# Patient Record
Sex: Female | Born: 1970 | Race: White | Hispanic: No | Marital: Single | Smoking: Former smoker
Health system: Southern US, Community
[De-identification: ages and names within clinical notes are randomized; demographics above are authoritative.]

## PROBLEM LIST (undated history)

## (undated) DIAGNOSIS — A64 Unspecified sexually transmitted disease: Secondary | ICD-10-CM

## (undated) DIAGNOSIS — Z8709 Personal history of other diseases of the respiratory system: Secondary | ICD-10-CM

## (undated) DIAGNOSIS — I4891 Unspecified atrial fibrillation: Secondary | ICD-10-CM

## (undated) DIAGNOSIS — I499 Cardiac arrhythmia, unspecified: Secondary | ICD-10-CM

## (undated) DIAGNOSIS — I10 Essential (primary) hypertension: Secondary | ICD-10-CM

## (undated) DIAGNOSIS — F329 Major depressive disorder, single episode, unspecified: Secondary | ICD-10-CM

## (undated) DIAGNOSIS — R32 Unspecified urinary incontinence: Secondary | ICD-10-CM

## (undated) DIAGNOSIS — M722 Plantar fascial fibromatosis: Secondary | ICD-10-CM

## (undated) DIAGNOSIS — F419 Anxiety disorder, unspecified: Secondary | ICD-10-CM

## (undated) DIAGNOSIS — F32A Depression, unspecified: Secondary | ICD-10-CM

## (undated) DIAGNOSIS — J45909 Unspecified asthma, uncomplicated: Secondary | ICD-10-CM

## (undated) DIAGNOSIS — Z87440 Personal history of urinary (tract) infections: Secondary | ICD-10-CM

## (undated) DIAGNOSIS — G43909 Migraine, unspecified, not intractable, without status migrainosus: Secondary | ICD-10-CM

## (undated) DIAGNOSIS — Z8619 Personal history of other infectious and parasitic diseases: Secondary | ICD-10-CM

## (undated) DIAGNOSIS — M7730 Calcaneal spur, unspecified foot: Secondary | ICD-10-CM

## (undated) DIAGNOSIS — I509 Heart failure, unspecified: Secondary | ICD-10-CM

## (undated) DIAGNOSIS — K219 Gastro-esophageal reflux disease without esophagitis: Secondary | ICD-10-CM

## (undated) DIAGNOSIS — R251 Tremor, unspecified: Secondary | ICD-10-CM

## (undated) HISTORY — DX: Gastro-esophageal reflux disease without esophagitis: K21.9

## (undated) HISTORY — DX: Anxiety disorder, unspecified: F41.9

## (undated) HISTORY — DX: Migraine, unspecified, not intractable, without status migrainosus: G43.909

## (undated) HISTORY — DX: Depression, unspecified: F32.A

## (undated) HISTORY — DX: Essential (primary) hypertension: I10

## (undated) HISTORY — DX: Unspecified atrial fibrillation: I48.91

## (undated) HISTORY — DX: Major depressive disorder, single episode, unspecified: F32.9

## (undated) HISTORY — DX: Personal history of other infectious and parasitic diseases: Z86.19

## (undated) HISTORY — DX: Heart failure, unspecified: I50.9

## (undated) HISTORY — DX: Unspecified asthma, uncomplicated: J45.909

## (undated) HISTORY — DX: Unspecified sexually transmitted disease: A64

---
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
## 1997-09-28 ENCOUNTER — Encounter (HOSPITAL_COMMUNITY): Admission: RE | Admit: 1997-09-28 | Discharge: 1997-12-27 | Payer: Self-pay | Admitting: Psychiatry

## 1997-10-12 ENCOUNTER — Emergency Department (HOSPITAL_COMMUNITY): Admission: EM | Admit: 1997-10-12 | Discharge: 1997-10-12 | Payer: Self-pay | Admitting: Emergency Medicine

## 1998-06-15 ENCOUNTER — Ambulatory Visit (HOSPITAL_COMMUNITY): Admission: RE | Admit: 1998-06-15 | Discharge: 1998-06-15 | Payer: Self-pay | Admitting: Gastroenterology

## 1998-06-15 ENCOUNTER — Encounter: Payer: Self-pay | Admitting: Gastroenterology

## 2000-04-08 ENCOUNTER — Other Ambulatory Visit: Admission: RE | Admit: 2000-04-08 | Discharge: 2000-04-08 | Payer: Self-pay | Admitting: *Deleted

## 2000-10-27 ENCOUNTER — Encounter: Admission: RE | Admit: 2000-10-27 | Discharge: 2000-12-29 | Payer: Self-pay

## 2001-03-22 ENCOUNTER — Other Ambulatory Visit: Admission: RE | Admit: 2001-03-22 | Discharge: 2001-03-22 | Payer: Self-pay | Admitting: Obstetrics and Gynecology

## 2002-03-23 ENCOUNTER — Encounter: Admission: RE | Admit: 2002-03-23 | Discharge: 2002-06-21 | Payer: Self-pay | Admitting: Internal Medicine

## 2003-09-04 ENCOUNTER — Inpatient Hospital Stay (HOSPITAL_COMMUNITY): Admission: EM | Admit: 2003-09-04 | Discharge: 2003-09-05 | Payer: Self-pay | Admitting: Emergency Medicine

## 2003-11-10 ENCOUNTER — Emergency Department (HOSPITAL_COMMUNITY): Admission: EM | Admit: 2003-11-10 | Discharge: 2003-11-11 | Payer: Self-pay | Admitting: Emergency Medicine

## 2003-11-11 ENCOUNTER — Inpatient Hospital Stay (HOSPITAL_COMMUNITY): Admission: EM | Admit: 2003-11-11 | Discharge: 2003-11-17 | Payer: Self-pay | Admitting: Psychiatry

## 2003-11-11 ENCOUNTER — Ambulatory Visit: Payer: Self-pay | Admitting: Psychiatry

## 2003-11-20 ENCOUNTER — Ambulatory Visit: Payer: Self-pay | Admitting: Psychiatry

## 2003-11-20 ENCOUNTER — Other Ambulatory Visit (HOSPITAL_COMMUNITY): Admission: RE | Admit: 2003-11-20 | Discharge: 2004-02-18 | Payer: Self-pay | Admitting: Psychiatry

## 2003-12-20 ENCOUNTER — Ambulatory Visit: Payer: Self-pay | Admitting: Psychiatry

## 2003-12-20 ENCOUNTER — Inpatient Hospital Stay (HOSPITAL_COMMUNITY): Admission: RE | Admit: 2003-12-20 | Discharge: 2003-12-23 | Payer: Self-pay | Admitting: Psychiatry

## 2003-12-20 ENCOUNTER — Emergency Department (HOSPITAL_COMMUNITY): Admission: EM | Admit: 2003-12-20 | Discharge: 2003-12-20 | Payer: Self-pay | Admitting: Emergency Medicine

## 2004-06-13 ENCOUNTER — Ambulatory Visit: Payer: Self-pay | Admitting: Psychiatry

## 2004-06-13 ENCOUNTER — Other Ambulatory Visit (HOSPITAL_COMMUNITY): Admission: RE | Admit: 2004-06-13 | Discharge: 2004-06-27 | Payer: Self-pay | Admitting: Psychiatry

## 2008-03-10 LAB — HM PAP SMEAR

## 2011-02-12 ENCOUNTER — Ambulatory Visit (INDEPENDENT_AMBULATORY_CARE_PROVIDER_SITE_OTHER): Payer: 59

## 2011-02-12 DIAGNOSIS — Z833 Family history of diabetes mellitus: Secondary | ICD-10-CM

## 2011-02-12 DIAGNOSIS — R059 Cough, unspecified: Secondary | ICD-10-CM

## 2011-02-12 DIAGNOSIS — J45909 Unspecified asthma, uncomplicated: Secondary | ICD-10-CM

## 2011-02-12 DIAGNOSIS — R05 Cough: Secondary | ICD-10-CM

## 2011-02-17 ENCOUNTER — Ambulatory Visit: Payer: Self-pay

## 2011-02-17 DIAGNOSIS — R059 Cough, unspecified: Secondary | ICD-10-CM

## 2011-02-17 DIAGNOSIS — R05 Cough: Secondary | ICD-10-CM

## 2011-02-17 DIAGNOSIS — H9209 Otalgia, unspecified ear: Secondary | ICD-10-CM

## 2011-11-08 ENCOUNTER — Ambulatory Visit: Payer: Self-pay | Admitting: Physician Assistant

## 2011-11-08 VITALS — BP 131/82 | HR 103 | Temp 99.8°F | Resp 18 | Ht 66.0 in | Wt 332.0 lb

## 2011-11-08 DIAGNOSIS — N898 Other specified noninflammatory disorders of vagina: Secondary | ICD-10-CM

## 2011-11-08 DIAGNOSIS — Z202 Contact with and (suspected) exposure to infections with a predominantly sexual mode of transmission: Secondary | ICD-10-CM

## 2011-11-08 DIAGNOSIS — B373 Candidiasis of vulva and vagina: Secondary | ICD-10-CM

## 2011-11-08 DIAGNOSIS — Z2089 Contact with and (suspected) exposure to other communicable diseases: Secondary | ICD-10-CM

## 2011-11-08 DIAGNOSIS — B3731 Acute candidiasis of vulva and vagina: Secondary | ICD-10-CM

## 2011-11-08 LAB — POCT WET PREP WITH KOH
KOH Prep POC: POSITIVE
Trichomonas, UA: NEGATIVE
Yeast Wet Prep HPF POC: NEGATIVE

## 2011-11-08 MED ORDER — FLUCONAZOLE 150 MG PO TABS: 150.0000 mg | ORAL_TABLET | Freq: Once | ORAL | Status: AC

## 2011-11-08 MED ORDER — METRONIDAZOLE 500 MG PO TABS: 500.0000 mg | ORAL_TABLET | Freq: Two times a day (BID) | ORAL | Status: DC

## 2011-11-08 NOTE — Progress Notes (Signed)
  Subjective:    Patient ID: Alyssa Blanchard, female    DOB: 11-17-1970, 41 y.o.   MRN: [id number]  HPI 41 year old female presents with concerns about being exposed to HSV. She has been in a year long relationship with 1 female partner and has recently found out that he may have herpes.  Says he has not disclosed all the details and she is not sure if he has cheated or not.  Says she went to the health department yesterday and was tested for GC/CL, HIV, and syphilis.  Does have 2 spots in her genital area that she is worried about. Says there is one on the inside that is painful and one on her mons pubis that is not tender and has mostly cleared. Has no history of cold sores or genital herpes.  Also complains of vaginal discharge that she believes may be bacterial vaginosis. Denies abdominal pain, fever, chills, dysuria, nausea, or vomiting.     Review of Systems  All other systems reviewed and are negative.       Objective:   Physical Exam  Constitutional: She is oriented to person, place, and time. She appears well-developed and well-nourished.  HENT:  Head: Normocephalic and atraumatic.  Right Ear: External ear normal.  Left Ear: External ear normal.  Mouth/Throat: No oropharyngeal exudate.  Eyes: Conjunctivae are normal.  Neck: Normal range of motion.  Cardiovascular: Normal rate, regular rhythm and normal heart sounds.   Pulmonary/Chest: Effort normal and breath sounds normal.  Abdominal: Bowel sounds are normal. There is no tenderness.  Genitourinary: Vagina normal. Pelvic exam was performed with patient supine. There is no rash, tenderness or lesion on the right labia. There is no rash, tenderness or lesion on the left labia. Cervix exhibits discharge (thick, white discharge). Cervix exhibits no motion tenderness and no friability.  Lymphadenopathy:    She has no cervical adenopathy.  Neurological: She is alert and oriented to person, place, and time.  Psychiatric: She has a normal  mood and affect. Her behavior is normal. Judgment and thought content normal.    Results for orders placed in visit on 11/08/11  POCT WET PREP WITH KOH      Component Value Range   Trichomonas, UA Negative     Clue Cells Wet Prep HPF POC 6-8     Epithelial Wet Prep HPF POC 2-6     Yeast Wet Prep HPF POC neg     Bacteria Wet Prep HPF POC 2+     RBC Wet Prep HPF POC 6-11     WBC Wet Prep HPF POC 0-2     KOH Prep POC Positive           Assessment & Plan:   1. Contact with or exposure to venereal disease  POCT Wet Prep with KOH, HSV(herpes simplex vrs) 1+2 ab-IgG  2. Yeast vaginitis  fluconazole (DIFLUCAN) 150 MG tablet  3. Leukorrhea  metroNIDAZOLE (FLAGYL) 500 MG tablet   Will await labwork.  Take Flagyl bid x 7 days.  Diflucan #1 now, and second dose after completion of Flagyl I spent >20 minutes counseling patient.

## 2011-11-11 LAB — HSV(HERPES SIMPLEX VRS) I + II AB-IGG
HSV 1 Glycoprotein G Ab, IgG: 4.27 IV — ABNORMAL HIGH
HSV 2 Glycoprotein G Ab, IgG: 8.84 IV — ABNORMAL HIGH

## 2011-11-12 ENCOUNTER — Other Ambulatory Visit: Payer: Self-pay | Admitting: Physician Assistant

## 2011-11-12 MED ORDER — VALACYCLOVIR HCL 1 G PO TABS: 1000.0000 mg | ORAL_TABLET | Freq: Every day | ORAL | Status: DC

## 2011-11-18 ENCOUNTER — Telehealth: Payer: Self-pay

## 2011-11-18 NOTE — Telephone Encounter (Signed)
Generic is only four dollars - [facility] [address] [employer] [phone]  [place]

## 2011-11-18 NOTE — Telephone Encounter (Signed)
PT STATES SHE HAD SEEN HEATHER AND GIVEN SOME MEDS, IT COST OVER $300.00 AND WOULD LIKE TO HAVE SOMETHING ELSE A LOST LESS EXPENSIVE CALLED IN. PLEASE CALL [phone]    [facility] ON [address]

## 2011-11-18 NOTE — Telephone Encounter (Signed)
Pt calling to let us know that she cannot afford her valtrex she would like it called [facility] to [facility] instead if possible would also like to talk to a nurse

## 2011-11-19 MED ORDER — VALACYCLOVIR HCL 1 G PO TABS: 1000.0000 mg | ORAL_TABLET | Freq: Every day | ORAL | Status: DC

## 2011-11-19 MED ORDER — ACYCLOVIR 200 MG PO CAPS: 400.0000 mg | ORAL_CAPSULE | Freq: Two times a day (BID) | ORAL | Status: AC

## 2011-11-19 NOTE — Telephone Encounter (Signed)
Pt requests that Acyclovir 200 mg be sent in to [facility] for 90 day supplies at a time instead of the Valtrex so that it will be inexpensive for her.

## 2011-11-19 NOTE — Telephone Encounter (Signed)
Called patient to advise this was sent in for her.  

## 2011-11-19 NOTE — Telephone Encounter (Signed)
I sent acyclovir 200 mg to pharmacy. She will take 2 capsules twice daily for suppression.

## 2011-11-24 ENCOUNTER — Ambulatory Visit: Payer: Self-pay | Admitting: Women's Health

## 2012-01-19 ENCOUNTER — Ambulatory Visit: Payer: Self-pay | Admitting: Women's Health

## 2012-07-01 ENCOUNTER — Ambulatory Visit (INDEPENDENT_AMBULATORY_CARE_PROVIDER_SITE_OTHER): Payer: BC Managed Care – PPO | Admitting: Emergency Medicine

## 2012-07-01 VITALS — BP 143/101 | HR 77 | Temp 98.4°F | Resp 18 | Wt 366.0 lb

## 2012-07-01 DIAGNOSIS — J019 Acute sinusitis, unspecified: Secondary | ICD-10-CM

## 2012-07-01 MED ORDER — HYDROCODONE-HOMATROPINE 5-1.5 MG/5ML PO SYRP: ORAL_SOLUTION | ORAL | Status: DC

## 2012-07-01 MED ORDER — FLUTICASONE PROPIONATE 50 MCG/ACT NA SUSP: 2.0000 | Freq: Every day | NASAL | Status: DC

## 2012-07-01 MED ORDER — AMOXICILLIN 500 MG PO CAPS: 500.0000 mg | ORAL_CAPSULE | Freq: Three times a day (TID) | ORAL | Status: DC

## 2012-07-01 NOTE — Progress Notes (Signed)
  Subjective:    Patient ID: Alyssa Blanchard, female    DOB: 12-May-1970, 42 y.o.   MRN: [id number]  HPI 42 yo female complaining of sore throat. Almost unable to swallow. Ear pain, cough, nasal congestion, fatigue, myalgias. Ongoing for 1 week and getting worse. Was visiting family last week, some on whom were sick. Taking dayquil, nyquil, delsym, mucinex. Occasionally coughs up yellow/green mucus. Lymph nodes are swollen. Fevers last week, nothing since then.     Review of Systems  Constitutional: Positive for fatigue.  HENT: Positive for ear pain, congestion, rhinorrhea and postnasal drip.   Respiratory: Positive for cough and wheezing.   Cardiovascular: Negative.   Gastrointestinal: Negative for nausea, vomiting, diarrhea and constipation.  Musculoskeletal: Positive for myalgias.  Skin: Negative.        Objective:   Physical Exam  Constitutional: She is oriented to person, place, and time. She appears well-developed and well-nourished.  HENT:  Head: Normocephalic and atraumatic.  Right Ear: External ear normal.  Left Ear: External ear normal.  Thick green mucus in nose. Mildly erythematous throat. Fluid behind R TM.   Neck: Normal range of motion. Neck supple.  Cardiovascular: Normal rate, regular rhythm and normal heart sounds.   Pulmonary/Chest: Breath sounds normal.  Minimal effort  Abdominal: Soft. Bowel sounds are normal.  Lymphadenopathy:    She has cervical adenopathy (tender).  Neurological: She is alert and oriented to person, place, and time.  Skin: Skin is warm and dry.          Assessment & Plan:  Sinusitis. Amoxicillin 500 bid. Flonase as directed. Hycodan cough syrup at night. Given work note for this weekend.

## 2012-07-01 NOTE — Patient Instructions (Addendum)
Take the Amoxicillin twice per day. Use flonase nasal spray, as directed. Use the hycodan cough syrup at night. Do not work or drive while taking it.   Sinusitis Sinusitis is redness, soreness, and swelling (inflammation) of the paranasal sinuses. Paranasal sinuses are air pockets within the bones of your face (beneath the eyes, the middle of the forehead, or above the eyes). In healthy paranasal sinuses, mucus is able to drain out, and air is able to circulate through them by way of your nose. However, when your paranasal sinuses are inflamed, mucus and air can become trapped. This can allow bacteria and other germs to grow and cause infection. Sinusitis can develop quickly and last only a short time (acute) or continue over a long period (chronic). Sinusitis that lasts for more than 12 weeks is considered chronic.  CAUSES  Causes of sinusitis include:  Allergies.  Structural abnormalities, such as displacement of the cartilage that separates your nostrils (deviated septum), which can decrease the air flow through your nose and sinuses and affect sinus drainage.  Functional abnormalities, such as when the small hairs (cilia) that line your sinuses and help remove mucus do not work properly or are not present. SYMPTOMS  Symptoms of acute and chronic sinusitis are the same. The primary symptoms are pain and pressure around the affected sinuses. Other symptoms include:  Upper toothache.  Earache.  Headache.  Bad breath.  Decreased sense of smell and taste.  A cough, which worsens when you are lying flat.  Fatigue.  Fever.  Thick drainage from your nose, which often is green and may contain pus (purulent).  Swelling and warmth over the affected sinuses. DIAGNOSIS  Your caregiver will perform a physical exam. During the exam, your caregiver may:  Look in your nose for signs of abnormal growths in your nostrils (nasal polyps).  Tap over the affected sinus to check for signs of  infection.  View the inside of your sinuses (endoscopy) with a special imaging device with a light attached (endoscope), which is inserted into your sinuses. If your caregiver suspects that you have chronic sinusitis, one or more of the following tests may be recommended:  Allergy tests.  Nasal culture A sample of mucus is taken from your nose and sent to a lab and screened for bacteria.  Nasal cytology A sample of mucus is taken from your nose and examined by your caregiver to determine if your sinusitis is related to an allergy. TREATMENT  Most cases of acute sinusitis are related to a viral infection and will resolve on their own within 10 days. Sometimes medicines are prescribed to help relieve symptoms (pain medicine, decongestants, nasal steroid sprays, or saline sprays).  However, for sinusitis related to a bacterial infection, your caregiver will prescribe antibiotic medicines. These are medicines that will help kill the bacteria causing the infection.  Rarely, sinusitis is caused by a fungal infection. In theses cases, your caregiver will prescribe antifungal medicine. For some cases of chronic sinusitis, surgery is needed. Generally, these are cases in which sinusitis recurs more than 3 times per year, despite other treatments. HOME CARE INSTRUCTIONS   Drink plenty of water. Water helps thin the mucus so your sinuses can drain more easily.  Use a humidifier.  Inhale steam 3 to 4 times a day (for example, sit in the bathroom with the shower running).  Apply a warm, moist washcloth to your face 3 to 4 times a day, or as directed by your caregiver.  Use saline  nasal sprays to help moisten and clean your sinuses.  Take over-the-counter or prescription medicines for pain, discomfort, or fever only as directed by your caregiver. SEEK IMMEDIATE MEDICAL CARE IF:  You have increasing pain or severe headaches.  You have nausea, vomiting, or drowsiness.  You have swelling around your  face.  You have vision problems.  You have a stiff neck.  You have difficulty breathing. MAKE SURE YOU:   Understand these instructions.  Will watch your condition.  Will get help right away if you are not doing well or get worse. Document Released: 02/24/2005 Document Revised: 05/19/2011 Document Reviewed: 03/11/2011 [facility] Patient Information 2013 [place], [place].

## 2012-07-06 ENCOUNTER — Telehealth: Payer: Self-pay

## 2012-07-06 MED ORDER — ALBUTEROL SULFATE HFA 108 (90 BASE) MCG/ACT IN AERS: 2.0000 | INHALATION_SPRAY | Freq: Four times a day (QID) | RESPIRATORY_TRACT | Status: DC | PRN

## 2012-07-06 MED ORDER — BENZONATATE 100 MG PO CAPS: 100.0000 mg | ORAL_CAPSULE | Freq: Three times a day (TID) | ORAL | Status: DC | PRN

## 2012-07-06 NOTE — Telephone Encounter (Signed)
Sent tessalon and albuterol to pharmacy.  If not improving, RTC

## 2012-07-06 NOTE — Telephone Encounter (Signed)
Pt states she doesn't want the narcotic cough meds anymore,wants to take the pearl for dry cough. Also requesting an inhaler   Best phone [phone]  [facility] [address]

## 2012-07-06 NOTE — Telephone Encounter (Signed)
Please advise. Pended tessalon, do not see where we have ever used inhaler ? Return to clinic or okay

## 2012-07-06 NOTE — Telephone Encounter (Signed)
Thanks, patient advised.  

## 2012-07-30 ENCOUNTER — Other Ambulatory Visit (INDEPENDENT_AMBULATORY_CARE_PROVIDER_SITE_OTHER): Payer: BC Managed Care – PPO

## 2012-07-30 ENCOUNTER — Encounter: Payer: Self-pay | Admitting: Internal Medicine

## 2012-07-30 ENCOUNTER — Ambulatory Visit (INDEPENDENT_AMBULATORY_CARE_PROVIDER_SITE_OTHER): Payer: BC Managed Care – PPO | Admitting: Internal Medicine

## 2012-07-30 VITALS — BP 134/86 | HR 88 | Temp 98.2°F | Ht 66.0 in | Wt 369.0 lb

## 2012-07-30 DIAGNOSIS — Z1329 Encounter for screening for other suspected endocrine disorder: Secondary | ICD-10-CM

## 2012-07-30 DIAGNOSIS — Z131 Encounter for screening for diabetes mellitus: Secondary | ICD-10-CM

## 2012-07-30 DIAGNOSIS — Z130 Encounter for screening for diseases of the blood and blood-forming organs and certain disorders involving the immune mechanism: Secondary | ICD-10-CM

## 2012-07-30 DIAGNOSIS — Z13220 Encounter for screening for lipoid disorders: Secondary | ICD-10-CM

## 2012-07-30 DIAGNOSIS — Z23 Encounter for immunization: Secondary | ICD-10-CM

## 2012-07-30 DIAGNOSIS — Z0000 Encounter for general adult medical examination without abnormal findings: Secondary | ICD-10-CM

## 2012-07-30 LAB — CBC
HCT: 40.2 % (ref 36.0–46.0)
Hemoglobin: 13.7 g/dL (ref 12.0–15.0)
MCHC: 34.1 g/dL (ref 30.0–36.0)
MCV: 87.4 fl (ref 78.0–100.0)
Platelets: 211 10*3/uL (ref 150.0–400.0)
RBC: 4.6 Mil/uL (ref 3.87–5.11)
RDW: 13.3 % (ref 11.5–14.6)
WBC: 9.4 10*3/uL (ref 4.5–10.5)

## 2012-07-30 LAB — BASIC METABOLIC PANEL
BUN: 9 mg/dL (ref 6–23)
CO2: 26 mEq/L (ref 19–32)
Calcium: 9.1 mg/dL (ref 8.4–10.5)
Chloride: 104 mEq/L (ref 96–112)
Creatinine, Ser: 0.8 mg/dL (ref 0.4–1.2)
GFR: 87.49 mL/min (ref >60.00)
Glucose, Bld: 95 mg/dL (ref 70–99)
Potassium: 4 mEq/L (ref 3.5–5.1)
Sodium: 139 mEq/L (ref 135–145)

## 2012-07-30 LAB — TSH: TSH: 2.51 u[IU]/mL (ref 0.35–5.50)

## 2012-07-30 LAB — LIPID PANEL
Cholesterol: 148 mg/dL (ref 0–200)
HDL: 40.9 mg/dL (ref >39.00)
LDL Cholesterol: 89 mg/dL (ref 0–99)
Total CHOL/HDL Ratio: 4
Triglycerides: 91 mg/dL (ref 0.0–149.0)
VLDL: 18.2 mg/dL (ref 0.0–40.0)

## 2012-07-30 LAB — HEPATIC FUNCTION PANEL
ALT: 19 U/L (ref 0–35)
AST: 16 U/L (ref 0–37)
Albumin: 3.7 g/dL (ref 3.5–5.2)
Alkaline Phosphatase: 67 U/L (ref 39–117)
Bilirubin, Direct: 0.1 mg/dL (ref 0.0–0.3)
Total Bilirubin: 0.5 mg/dL (ref 0.3–1.2)
Total Protein: 7.3 g/dL (ref 6.0–8.3)

## 2012-07-30 LAB — HEMOGLOBIN A1C: Hgb A1c MFr Bld: 5.6 % (ref 4.6–6.5)

## 2012-07-30 NOTE — Patient Instructions (Signed)

## 2012-07-30 NOTE — Progress Notes (Signed)
HPI  Pt presents to the clinic today to establish care. She has not had a PCP. Goes to urgent care when needed .She has no concerns today. She is going to a seminar about weight loss surgery in June.  Flu: 2012 Tetanus: unsure Pneumovax: 2012 LMP: 07/28/12 Pap smear: 2010  Past Medical History  Diagnosis Date  . Depression   . GERD (gastroesophageal reflux disease)   . Anxiety   . Asthma   . Migraines   . History of chicken pox     Current Outpatient Prescriptions  Medication Sig Dispense Refill  . acyclovir (ZOVIRAX) 200 MG capsule Take 200 mg by mouth daily.      Marland Kitchen albuterol (PROVENTIL HFA;VENTOLIN HFA) 108 (90 BASE) MCG/ACT inhaler Inhale 2 puffs into the lungs every 6 (six) hours as needed.  6.7 g  1  . ARIPiprazole (ABILIFY) 15 MG tablet Take 15 mg by mouth daily.      Marland Kitchen buPROPion (WELLBUTRIN SR) 150 MG 12 hr tablet Take 150 mg by mouth 2 (two) times daily.      . Cholecalciferol (VITAMIN D-3) 1000 UNITS CAPS Take 1 capsule by mouth daily.      . famotidine (PEPCID) 20 MG tablet Take 20 mg by mouth 2 (two) times daily.      . fluticasone (FLONASE) 50 MCG/ACT nasal spray Place 2 sprays into the nose daily.  16 g  6  . Lurasidone HCl (LATUDA) 60 MG TABS Take 1 tablet by mouth daily.      . traZODone (DESYREL) 150 MG tablet Take 150 mg by mouth at bedtime.      . vitamin B-12 (CYANOCOBALAMIN) 500 MCG tablet Take 500 mcg by mouth daily.       No current facility-administered medications for this visit.    No Known Allergies  Family History  Problem Relation Age of Onset  . Diabetes Mother   . Hypertension Mother   . Hyperlipidemia Mother   . Kidney disease Mother   . Cancer Father     History   Social History  . Marital Status: Single    Spouse Name: N/A    Number of Children: N/A  . Years of Education: 12   Occupational History  . [employer]   Social History Main Topics  . Smoking status: Former Games developer  . Smokeless tobacco: Never Used  .  Alcohol Use: No  . Drug Use: No  . Sexually Active: Yes   Other Topics Concern  . Not on file   Social History Narrative   Regular exercise-no   Caffeine Use-yes    ROS:  Constitutional: Denies fever, malaise, fatigue, headache or abrupt weight changes.  HEENT: Denies eye pain, eye redness, ear pain, ringing in the ears, wax buildup, runny nose, nasal congestion, bloody nose, or sore throat. Respiratory: Denies difficulty breathing, shortness of breath, cough or sputum production.   Cardiovascular: Denies chest pain, chest tightness, palpitations or swelling in the hands or feet.  Gastrointestinal: Denies abdominal pain, bloating, constipation, diarrhea or blood in the stool.  GU: Denies frequency, urgency, pain with urination, blood in urine, odor or discharge. Musculoskeletal: Denies decrease in range of motion, difficulty with gait, muscle pain or joint pain and swelling.  Skin: Denies redness, rashes, lesions or ulcercations.  Neurological: Denies dizziness, difficulty with memory, difficulty with speech or problems with balance and coordination.   No other specific complaints in a complete review of systems (except as listed in HPI above).  PE:  BP 134/86  Pulse 88  Temp(Src) 98.2 F (36.8 C) (Oral)  Ht 5\' 6"  (1.676 m)  Wt 369 lb (167.377 kg)  BMI 59.59 kg/m2  SpO2 97%  LMP 07/28/2012 Wt Readings from Last 3 Encounters:  07/30/12 369 lb (167.377 kg)  07/01/12 366 lb (166.017 kg)  11/08/11 332 lb (150.594 kg)    General: Appears her stated age, obese but well developed, well nourished in NAD. HEENT: Head: normal shape and size; Eyes: sclera white, no icterus, conjunctiva pink, PERRLA and EOMs intact; Ears: Tm's gray and intact, normal light reflex; Nose: mucosa pink and moist, septum midline; Throat/Mouth: Teeth present, mucosa pink and moist, no lesions or ulcerations noted.  Neck: Normal range of motion. Neck supple, trachea midline. No massses, lumps or thyromegaly  present.  Cardiovascular: Normal rate and rhythm. S1,S2 noted.  No murmur, rubs or gallops noted. No JVD or BLE edema. No carotid bruits noted. Pulmonary/Chest: Normal effort and positive vesicular breath sounds. No respiratory distress. No wheezes, rales or ronchi noted.  Abdomen: Soft and nontender. Normal bowel sounds, no bruits noted. No distention or masses noted. Liver, spleen and kidneys non palpable. Musculoskeletal: Normal range of motion. No signs of joint swelling. No difficulty with gait.  Neurological: Alert and oriented. Cranial nerves II-XII intact. Coordination normal. +DTRs bilaterally. Psychiatric: Mood and affect normal. Behavior is normal. Judgment and thought content normal.      Assessment and Plan:  Preventative Health Maintenance:  Start working on diet and exercise  Tdap given today Call your gyn to set up a pap smear Basic labs obtained today

## 2012-08-17 ENCOUNTER — Telehealth: Payer: Self-pay | Admitting: Internal Medicine

## 2012-08-17 NOTE — Telephone Encounter (Signed)
Called pt confirmed Psychiatrist, Dr Tiajuana Amass at [facility] [phone] phone; [phone] fax.  Faxed labs

## 2012-08-17 NOTE — Telephone Encounter (Signed)
Ok to fax a copy of her results to psychiatry.

## 2012-08-17 NOTE — Telephone Encounter (Signed)
Pt called Triage requesting her recent lab results to be faxed to her Psychiatrist, so medications may be refilled.  Please advise

## 2012-08-25 ENCOUNTER — Encounter: Payer: Self-pay | Admitting: Internal Medicine

## 2012-08-25 ENCOUNTER — Ambulatory Visit (INDEPENDENT_AMBULATORY_CARE_PROVIDER_SITE_OTHER): Payer: BC Managed Care – PPO | Admitting: Internal Medicine

## 2012-08-25 DIAGNOSIS — H9201 Otalgia, right ear: Secondary | ICD-10-CM

## 2012-08-25 DIAGNOSIS — J309 Allergic rhinitis, unspecified: Secondary | ICD-10-CM

## 2012-08-25 DIAGNOSIS — R058 Other specified cough: Secondary | ICD-10-CM

## 2012-08-25 DIAGNOSIS — R059 Cough, unspecified: Secondary | ICD-10-CM

## 2012-08-25 DIAGNOSIS — R51 Headache: Secondary | ICD-10-CM

## 2012-08-25 DIAGNOSIS — R05 Cough: Secondary | ICD-10-CM

## 2012-08-25 DIAGNOSIS — H9209 Otalgia, unspecified ear: Secondary | ICD-10-CM

## 2012-08-25 MED ORDER — METHYLPREDNISOLONE ACETATE 80 MG/ML IJ SUSP
80.0000 mg | Freq: Once | INTRAMUSCULAR | Status: AC
  Administered 2012-08-25: 80 mg via INTRAMUSCULAR

## 2012-08-25 NOTE — Patient Instructions (Signed)
Allergic Rhinitis Allergic rhinitis is when the mucous membranes in the nose respond to allergens. Allergens are particles in the air that cause your body to have an allergic reaction. This causes you to release allergic antibodies. Through a chain of events, these eventually cause you to release histamine into the blood stream (hence the use of antihistamines). Although meant to be protective to the body, it is this release that causes your discomfort, such as frequent sneezing, congestion and an itchy runny nose.  CAUSES  The pollen allergens may come from grasses, trees, and weeds. This is seasonal allergic rhinitis, or "hay fever." Other allergens cause year-round allergic rhinitis (perennial allergic rhinitis) such as house dust mite allergen, pet dander and mold spores.  SYMPTOMS   Nasal stuffiness (congestion).  Runny, itchy nose with sneezing and tearing of the eyes.  There is often an itching of the mouth, eyes and ears. It cannot be cured, but it can be controlled with medications. DIAGNOSIS  If you are unable to determine the offending allergen, skin or blood testing may find it. TREATMENT   Avoid the allergen.  Medications and allergy shots (immunotherapy) can help.  Hay fever may often be treated with antihistamines in pill or nasal spray forms. Antihistamines block the effects of histamine. There are over-the-counter medicines that may help with nasal congestion and swelling around the eyes. Check with your caregiver before taking or giving this medicine. If the treatment above does not work, there are many new medications your caregiver can prescribe. Stronger medications may be used if initial measures are ineffective. Desensitizing injections can be used if medications and avoidance fails. Desensitization is when a patient is given ongoing shots until the body becomes less sensitive to the allergen. Make sure you follow up with your caregiver if problems continue. SEEK MEDICAL  CARE IF:   You develop fever (more than 100.5 F (38.1 C).  You develop a cough that does not stop easily (persistent).  You have shortness of breath.  You start wheezing.  Symptoms interfere with normal daily activities. Document Released: 11/19/2000 Document Revised: 05/19/2011 Document Reviewed: 05/31/2008 [employer] Patient Information 2014 [employer].  

## 2012-08-25 NOTE — Addendum Note (Signed)
Addended by: Lorre Munroe on: 08/25/2012 02:56 PM   Modules accepted: Orders

## 2012-08-25 NOTE — Addendum Note (Signed)
Addended by: Carin Primrose on: 08/25/2012 03:18 PM   Modules accepted: Orders

## 2012-08-25 NOTE — Progress Notes (Signed)
HPI  Pt presents to the clinic today with c/o dry cough. This started 1 week ago, while at work. She was working in the Hotel manager. She also c/o headache and right ear pain. She denies fever chills or body aches. She has used her inhaler, Excedrin, Claritin and Flonase.  She does feel like her symptoms are getting worse. She has no history of allergies but she does have asthma. She has not had sick contacts. Additionally, she needs a letter of medical necessity to get her ready for her lap band surgery. She also needs a referral to a nutritionist and plans to come here for her weight checks.   Past Medical History  Diagnosis Date  . Depression   . GERD (gastroesophageal reflux disease)   . Anxiety   . Asthma   . Migraines   . History of chicken pox     Current Outpatient Prescriptions  Medication Sig Dispense Refill  . acyclovir (ZOVIRAX) 200 MG capsule Take 200 mg by mouth daily.      Marland Kitchen albuterol (PROVENTIL HFA;VENTOLIN HFA) 108 (90 BASE) MCG/ACT inhaler Inhale 2 puffs into the lungs every 6 (six) hours as needed.  6.7 g  1  . ARIPiprazole (ABILIFY) 15 MG tablet Take 15 mg by mouth daily.      Marland Kitchen buPROPion (WELLBUTRIN SR) 150 MG 12 hr tablet Take 150 mg by mouth 2 (two) times daily.      . Cholecalciferol (VITAMIN D-3) 1000 UNITS CAPS Take 1 capsule by mouth daily.      . famotidine (PEPCID) 20 MG tablet Take 20 mg by mouth 2 (two) times daily.      . fluticasone (FLONASE) 50 MCG/ACT nasal spray Place 2 sprays into the nose daily.  16 g  6  . LORazepam (ATIVAN) 0.5 MG tablet Take 0.5 mg by mouth daily.      . Lurasidone HCl (LATUDA) 60 MG TABS Take 1 tablet by mouth daily.      . traZODone (DESYREL) 150 MG tablet Take 150 mg by mouth at bedtime.      . vitamin B-12 (CYANOCOBALAMIN) 500 MCG tablet Take 500 mcg by mouth daily.       No current facility-administered medications for this visit.    No Known Allergies  Family History  Problem Relation Age of Onset  . Diabetes  Mother   . Hypertension Mother   . Hyperlipidemia Mother   . Kidney disease Mother   . Cancer Father     History   Social History  . Marital Status: Single    Spouse Name: N/A    Number of Children: N/A  . Years of Education: 12   Occupational History  . [employer]   Social History Main Topics  . Smoking status: Former Games developer  . Smokeless tobacco: Never Used  . Alcohol Use: No  . Drug Use: No  . Sexually Active: Yes   Other Topics Concern  . Not on file   Social History Narrative   Regular exercise-no   Caffeine Use-yes    ROS:  Constitutional: Pt reports headache. Denies fever, malaise, fatigue or abrupt weight changes.  HEENT: Pt reports right ear pain. Denies eye pain, eye redness, ringing in the ears, wax buildup, runny nose, nasal congestion, bloody nose, or sore throat. Respiratory: Pt reports dry cough. Denies difficulty breathing, shortness of breath, cough or sputum production.   Neurological: Denies dizziness, difficulty with memory, difficulty with speech or problems with balance and coordination.  No other specific complaints in a complete review of systems (except as listed in HPI above).  PE:  BP 138/72  Pulse 87  Temp(Src) 97.3 F (36.3 C) (Oral)  Ht 5\' 6"  (1.676 m)  Wt 378 lb (171.46 kg)  BMI 61.04 kg/m2  SpO2 97%  LMP 07/28/2012 Wt Readings from Last 3 Encounters:  08/25/12 378 lb (171.46 kg)  07/30/12 369 lb (167.377 kg)  07/01/12 366 lb (166.017 kg)    General: Appears her stated age, obese but well developed, well nourished in NAD. HEENT: Head: normal shape and size; Eyes: sclera white, no icterus, conjunctiva pink, PERRLA and EOMs intact; Ears: Tm's gray and intact, normal light reflex; Nose: mucosa pink and moist, septum midline; Throat/Mouth: Teeth present, mucosa pink and moist, no lesions or ulcerations noted.   Cardiovascular: Normal rate and rhythm. S1,S2 noted.  No murmur, rubs or gallops noted. No JVD or BLE  edema. No carotid bruits noted. Pulmonary/Chest: Normal effort and positive vesicular breath sounds. No respiratory distress. No wheezes, rales or ronchi noted.   Neurological: Alert and oriented. Cranial nerves II-XII intact. Coordination normal. +DTRs bilaterally.     BMET    Component Value Date/Time   NA 139 07/30/2012 1111   K 4.0 07/30/2012 1111   CL 104 07/30/2012 1111   CO2 26 07/30/2012 1111   GLUCOSE 95 07/30/2012 1111   BUN 9 07/30/2012 1111   CREATININE 0.8 07/30/2012 1111   CALCIUM 9.1 07/30/2012 1111    Lipid Panel     Component Value Date/Time   CHOL 148 07/30/2012 1111   TRIG 91.0 07/30/2012 1111   HDL 40.90 07/30/2012 1111   CHOLHDL 4 07/30/2012 1111   VLDL 18.2 07/30/2012 1111   LDLCALC 89 07/30/2012 1111    CBC    Component Value Date/Time   WBC 9.4 07/30/2012 1111   RBC 4.60 07/30/2012 1111   HGB 13.7 07/30/2012 1111   HCT 40.2 07/30/2012 1111   PLT 211.0 07/30/2012 1111   MCV 87.4 07/30/2012 1111   MCHC 34.1 07/30/2012 1111   RDW 13.3 07/30/2012 1111    Hgb A1C Lab Results  Component Value Date   HGBA1C 5.6 07/30/2012     Assessment and Plan:  Headache, ear pain and dry cough secondary to allergic rhinitis:  Decrease the use of your inhaler Continue claritin and flonase 80 mg Depo IM today Continue to monitor symptoms  Obesity,:  Will refer to nutrition for diet plan Will see you monthly for the next 6 months for weight checks

## 2012-08-31 ENCOUNTER — Telehealth: Payer: Self-pay

## 2012-08-31 ENCOUNTER — Ambulatory Visit (INDEPENDENT_AMBULATORY_CARE_PROVIDER_SITE_OTHER): Payer: BC Managed Care – PPO | Admitting: Family

## 2012-08-31 ENCOUNTER — Other Ambulatory Visit: Payer: Self-pay

## 2012-08-31 ENCOUNTER — Ambulatory Visit (INDEPENDENT_AMBULATORY_CARE_PROVIDER_SITE_OTHER): Payer: BC Managed Care – PPO | Admitting: Certified Nurse Midwife

## 2012-08-31 ENCOUNTER — Encounter: Payer: Self-pay | Admitting: Family

## 2012-08-31 ENCOUNTER — Encounter: Payer: Self-pay | Admitting: Certified Nurse Midwife

## 2012-08-31 VITALS — BP 128/64 | HR 92 | Wt 376.4 lb

## 2012-08-31 VITALS — BP 122/80 | HR 80 | Resp 16 | Ht 66.25 in | Wt 376.0 lb

## 2012-08-31 DIAGNOSIS — Z0000 Encounter for general adult medical examination without abnormal findings: Secondary | ICD-10-CM

## 2012-08-31 DIAGNOSIS — H53149 Visual discomfort, unspecified: Secondary | ICD-10-CM

## 2012-08-31 DIAGNOSIS — Z01419 Encounter for gynecological examination (general) (routine) without abnormal findings: Secondary | ICD-10-CM

## 2012-08-31 DIAGNOSIS — R110 Nausea: Secondary | ICD-10-CM

## 2012-08-31 DIAGNOSIS — G43909 Migraine, unspecified, not intractable, without status migrainosus: Secondary | ICD-10-CM

## 2012-08-31 DIAGNOSIS — Z1231 Encounter for screening mammogram for malignant neoplasm of breast: Secondary | ICD-10-CM

## 2012-08-31 LAB — POCT URINALYSIS DIPSTICK
Bilirubin, UA: NEGATIVE
Blood, UA: NEGATIVE
Glucose, UA: NEGATIVE
Ketones, UA: NEGATIVE
Leukocytes, UA: NEGATIVE
Nitrite, UA: NEGATIVE
Protein, UA: NEGATIVE
Urobilinogen, UA: NEGATIVE
pH, UA: 5

## 2012-08-31 MED ORDER — KETOROLAC TROMETHAMINE 60 MG/2ML IM SOLN
60.0000 mg | Freq: Once | INTRAMUSCULAR | Status: AC
  Administered 2012-08-31: 60 mg via INTRAMUSCULAR

## 2012-08-31 MED ORDER — SUMATRIPTAN SUCCINATE 50 MG PO TABS: 50.0000 mg | ORAL_TABLET | ORAL | Status: DC | PRN

## 2012-08-31 MED ORDER — HYDROCODONE-ACETAMINOPHEN 5-325 MG PO TABS: 1.0000 | ORAL_TABLET | Freq: Four times a day (QID) | ORAL | Status: DC | PRN

## 2012-08-31 MED ORDER — PROMETHAZINE HCL 25 MG PO TABS: 12.5000 mg | ORAL_TABLET | Freq: Three times a day (TID) | ORAL | Status: DC | PRN

## 2012-08-31 NOTE — Progress Notes (Signed)
42 y.o. No obstetric history on file. Single Caucasian Fe here for annual exam.  Periods normal and regular over the past year. Contraception condom use, working well. STD screening in past year, all negative. Not currently sexually active. Sees PCP yearly with labs.  Patient considering lap band surgery.  Patient's last menstrual period was 08/26/2012.          Sexually active: yes  The current method of family planning is condoms most of the time.    Exercising: no  exercise Smoker:  no  Health Maintenance: Pap:  2010 MMG:  none Colonoscopy:  none BMD:   none TDaP:  6/14 Labs: Poct urine-neg Self breast exam: done occ   reports that she has quit smoking. She has never used smokeless tobacco. She reports that  drinks alcohol. She reports that she does not use illicit drugs.  Past Medical History  Diagnosis Date  . Depression   . GERD (gastroesophageal reflux disease)   . Anxiety   . Asthma   . Migraines   . History of chicken pox   . STD (sexually transmitted disease)     chl hx & hsv 1&2    Past Surgical History  Procedure Laterality Date  . Tonsillectomy  1978    Current Outpatient Prescriptions  Medication Sig Dispense Refill  . acyclovir (ZOVIRAX) 200 MG capsule Take 200 mg by mouth as needed.       Marland Kitchen albuterol (PROVENTIL HFA;VENTOLIN HFA) 108 (90 BASE) MCG/ACT inhaler Inhale 2 puffs into the lungs every 6 (six) hours as needed.  6.7 g  1  . ARIPiprazole (ABILIFY) 15 MG tablet Take 15 mg by mouth daily.      Marland Kitchen buPROPion (WELLBUTRIN SR) 150 MG 12 hr tablet Take 150 mg by mouth daily.       . Cholecalciferol (VITAMIN D-3) 1000 UNITS CAPS Take 1 capsule by mouth daily.      . famotidine (PEPCID) 20 MG tablet Take 20 mg by mouth 2 (two) times daily.      . fluticasone (FLONASE) 50 MCG/ACT nasal spray Place 2 sprays into the nose daily.  16 g  6  . LORazepam (ATIVAN) 0.5 MG tablet Take 0.5 mg by mouth daily.      . Lurasidone HCl (LATUDA) 60 MG TABS Take 1 tablet by  mouth daily.      . traZODone (DESYREL) 150 MG tablet Take 150 mg by mouth at bedtime.      . vitamin B-12 (CYANOCOBALAMIN) 500 MCG tablet Take 500 mcg by mouth daily.       No current facility-administered medications for this visit.    Family History  Problem Relation Age of Onset  . Diabetes Mother   . Hypertension Mother   . Hyperlipidemia Mother   . Kidney disease Mother   . Cancer Father     pancreatic  . Hypertension Maternal Grandmother   . Diabetes Maternal Grandmother   . Heart failure Maternal Grandmother     CHF  . Heart attack Maternal Grandfather   . Hypertension Maternal Grandfather   . Hypertension Paternal Grandmother   . Alzheimer's disease Paternal Grandmother   . Hypertension Paternal Grandfather   . Cancer Maternal Uncle     melanoma  . Cancer Paternal Uncle     kidney    ROS:  Pertinent items are noted in HPI.  Otherwise, a comprehensive ROS was negative.  Exam:   BP 122/80  Pulse 80  Resp 16  Ht 5' 6.25" (  1.683 m)  Wt 376 lb (170.552 kg)  BMI 60.21 kg/m2  LMP 08/26/2012 Height: 5' 6.25" (168.3 cm)  Ht Readings from Last 3 Encounters:  08/31/12 5' 6.25" (1.683 m)  08/25/12 5\' 6"  (1.676 m)  07/30/12 5\' 6"  (1.676 m)    General appearance: alert, cooperative and appears stated age Head: Normocephalic, without obvious abnormality, atraumatic Neck: no adenopathy, supple, symmetrical, trachea midline and thyroid normal to inspection and palpation Lungs: clear to auscultation bilaterally Breasts: normal appearance, no masses or tenderness, No nipple retraction or dimpling, No nipple discharge or bleeding, No axillary or supraclavicular adenopathy, Taught monthly breast self examination Heart: regular rate and rhythm Abdomen: soft, non-tender; no masses,  no organomegaly Extremities: extremities normal, atraumatic, no cyanosis or edema Skin: Skin color, texture, turgor normal. No rashes or lesions Lymph nodes: Cervical, supraclavicular, and  axillary nodes normal. No abnormal inguinal nodes palpated Neurologic: Grossly normal   Pelvic: External genitalia:  no lesions              Urethra:  normal appearing urethra with no masses, tenderness or lesions              Bartholin's and Skene's: normal                 Vagina: normal appearing vagina with normal color and discharge, no lesions              Cervix: high in vagina, normal , non tender              Pap taken: yes Bimanual Exam:  Uterus:  normal and exam limited by body habitus              Adnexa: no large masses palpated non tender, unable to palpate ovaries due to body habitus                Rectovaginal: Confirms               Anus:  normal sphincter tone, no lesions  A:  Well Woman with normal exam   Contraception: Condoms  Morbid obesity limited pelvic exam  History of anxiety and depression on medications managed by PCP  Strong family history of diabetes and hypertension   Asthma well controlled with medication per patient  P:   Reviewed health and wellness pertinent to exam  Stressed importance of consistent use for protection  Discussed limitations of exam due to body habitus and recommend PUS to evaluate.  Patient agreeable  Stressed importance of PCP exam yearly due family history, encouraged Lap surgery she is considering if a candidate  Pap smear as per guidelines   Mammogram yearly, given information to schedule pap smear taken today  counseled on breast self exam, mammography screening, STD prevention, adequate intake of calcium and vitamin D, diet and exercise  return annually or prn  An After Visit Summary was printed and given to the patient.  Reviewed, TL

## 2012-08-31 NOTE — Telephone Encounter (Signed)
Per Oran Rein, call in Norco 5-325mg  tabs #10  Rx phoned in

## 2012-08-31 NOTE — Patient Instructions (Addendum)
Migraine Headache A migraine headache is an intense, throbbing pain on one or both sides of your head. A migraine can last for 30 minutes to several hours. CAUSES  The exact cause of a migraine headache is not always known. However, a migraine may be caused when nerves in the brain become irritated and release chemicals that cause inflammation. This causes pain. SYMPTOMS  Pain on one or both sides of your head.  Pulsating or throbbing pain.  Severe pain that prevents daily activities.  Pain that is aggravated by any physical activity.  Nausea, vomiting, or both.  Dizziness.  Pain with exposure to bright lights, loud noises, or activity.  General sensitivity to bright lights, loud noises, or smells. Before you get a migraine, you may get warning signs that a migraine is coming (aura). An aura may include:  Seeing flashing lights.  Seeing bright spots, halos, or zig-zag lines.  Having tunnel vision or blurred vision.  Having feelings of numbness or tingling.  Having trouble talking.  Having muscle weakness. MIGRAINE TRIGGERS  Alcohol.  Smoking.  Stress.  Menstruation.  Aged cheeses.  Foods or drinks that contain nitrates, glutamate, aspartame, or tyramine.  Lack of sleep.  Chocolate.  Caffeine.  Hunger.  Physical exertion.  Fatigue.  Medicines used to treat chest pain (nitroglycerine), birth control pills, estrogen, and some blood pressure medicines. DIAGNOSIS  A migraine headache is often diagnosed based on:  Symptoms.  Physical examination.  A CT scan or MRI of your head. TREATMENT Medicines may be given for pain and nausea. Medicines can also be given to help prevent recurrent migraines.  HOME CARE INSTRUCTIONS  Only take over-the-counter or prescription medicines for pain or discomfort as directed by your caregiver. The use of long-term narcotics is not recommended.  Lie down in a dark, quiet room when you have a migraine.  Keep a journal  to find out what may trigger your migraine headaches. For example, write down:  What you eat and drink.  How much sleep you get.  Any change to your diet or medicines.  Limit alcohol consumption.  Quit smoking if you smoke.  Get 7 to 9 hours of sleep, or as recommended by your caregiver.  Limit stress.  Keep lights dim if bright lights bother you and make your migraines worse. SEEK IMMEDIATE MEDICAL CARE IF:   Your migraine becomes severe.  You have a fever.  You have a stiff neck.  You have vision loss.  You have muscular weakness or loss of muscle control.  You start losing your balance or have trouble walking.  You feel faint or pass out.  You have severe symptoms that are different from your first symptoms. MAKE SURE YOU:   Understand these instructions.  Will watch your condition.  Will get help right away if you are not doing well or get worse. Document Released: 02/24/2005 Document Revised: 05/19/2011 Document Reviewed: 02/14/2011 [employer] Patient Information 2014 [employer].  

## 2012-08-31 NOTE — Patient Instructions (Signed)

## 2012-08-31 NOTE — Progress Notes (Signed)
Subjective:    Patient ID: Alyssa Blanchard, female    DOB: 24-Jan-1971, 42 y.o.   MRN: [id number]  HPI  42 year old white female, patient of Rene Kocher, NP is in today with c/o a migraine headache that began this morning 5am. She has been taking Tylenol and Excedrin with no relief. Has had nausea but no vomiting. She is not taking and prevention or rescue medications. But has not had a migraine in 8 months. The headache is frontal.   Review of Systems  Constitutional: Negative.   Eyes: Positive for photophobia.  Respiratory: Negative.   Cardiovascular: Negative.   Gastrointestinal: Positive for nausea. Negative for vomiting.  Genitourinary: Negative.   Musculoskeletal: Negative.   Skin: Negative.   Allergic/Immunologic: Negative.   Neurological: Positive for headaches.  Psychiatric/Behavioral: Negative.    Past Medical History  Diagnosis Date  . Depression   . GERD (gastroesophageal reflux disease)   . Anxiety   . Asthma   . Migraines   . History of chicken pox   . STD (sexually transmitted disease)     chl hx & hsv 1&2    History   Social History  . Marital Status: Single    Spouse Name: N/A    Number of Children: N/A  . Years of Education: 12   Occupational History  . [employer]   Social History Main Topics  . Smoking status: Former Games developer  . Smokeless tobacco: Never Used  . Alcohol Use: Yes     Comment: 1 a month  . Drug Use: No  . Sexually Active: Yes -- Female partner(s)    Birth Control/ Protection: Condom   Other Topics Concern  . Not on file   Social History Narrative   Regular exercise-no   Caffeine Use-yes    Past Surgical History  Procedure Laterality Date  . Tonsillectomy  1978    Family History  Problem Relation Age of Onset  . Diabetes Mother   . Hypertension Mother   . Hyperlipidemia Mother   . Kidney disease Mother   . Cancer Father     pancreatic  . Hypertension Maternal Grandmother   . Diabetes Maternal Grandmother    . Heart failure Maternal Grandmother     CHF  . Heart attack Maternal Grandfather   . Hypertension Maternal Grandfather   . Hypertension Paternal Grandmother   . Alzheimer's disease Paternal Grandmother   . Hypertension Paternal Grandfather   . Cancer Maternal Uncle     melanoma  . Cancer Paternal Uncle     kidney    No Known Allergies  Current Outpatient Prescriptions on File Prior to Visit  Medication Sig Dispense Refill  . albuterol (PROVENTIL HFA;VENTOLIN HFA) 108 (90 BASE) MCG/ACT inhaler Inhale 2 puffs into the lungs every 6 (six) hours as needed.  6.7 g  1  . ARIPiprazole (ABILIFY) 15 MG tablet Take 15 mg by mouth daily.      Marland Kitchen buPROPion (WELLBUTRIN SR) 150 MG 12 hr tablet Take 150 mg by mouth daily.       . Cholecalciferol (VITAMIN D-3) 1000 UNITS CAPS Take 1 capsule by mouth daily.      . famotidine (PEPCID) 20 MG tablet Take 20 mg by mouth 2 (two) times daily.      . fluticasone (FLONASE) 50 MCG/ACT nasal spray Place 2 sprays into the nose daily.  16 g  6  . LORazepam (ATIVAN) 0.5 MG tablet Take 0.5 mg by mouth daily.      Marland Kitchen  Lurasidone HCl (LATUDA) 60 MG TABS Take 1 tablet by mouth daily.      . traZODone (DESYREL) 150 MG tablet Take 150 mg by mouth at bedtime.      . vitamin B-12 (CYANOCOBALAMIN) 500 MCG tablet Take 500 mcg by mouth daily.       No current facility-administered medications on file prior to visit.    BP 128/64  Pulse 92  Wt 376 lb 6.4 oz (170.734 kg)  BMI 60.28 kg/m2  SpO2 98%  LMP 06/19/2014chart    Objective:   Physical Exam  Constitutional: She is oriented to person, place, and time. She appears well-developed and well-nourished.  HENT:  Right Ear: External ear normal.  Left Ear: External ear normal.  Nose: Nose normal.  Mouth/Throat: Oropharynx is clear and moist.  Neck: Normal range of motion. Neck supple.  Cardiovascular: Normal rate, regular rhythm and normal heart sounds.   Pulmonary/Chest: Effort normal and breath sounds normal.   Abdominal: Soft. Bowel sounds are normal.  Musculoskeletal: Normal range of motion.  Neurological: She is alert and oriented to person, place, and time. She has normal reflexes. She displays normal reflexes. No cranial nerve deficit. Coordination normal.  Skin: Skin is warm and dry.  Psychiatric: She has a normal mood and affect.    Toradol 60mg  IM x 1     Assessment & Plan:  Assessment: 1. Migraine Headache 2. Nausea  Plan: Phenergan 25mg  1 tab three times a day as needed. Rest, drink plenty of water. Rest today. Call if symptoms worsen or persist. Imitrex sent as a rescue med in the future if needed. Direction provided.

## 2012-09-02 ENCOUNTER — Telehealth: Payer: Self-pay | Admitting: Certified Nurse Midwife

## 2012-09-02 LAB — IPS PAP TEST WITH HPV

## 2012-09-02 NOTE — Telephone Encounter (Signed)
LMTCB to discuss insurance benefits and schedule PUS.  °

## 2012-09-09 ENCOUNTER — Encounter (INDEPENDENT_AMBULATORY_CARE_PROVIDER_SITE_OTHER): Payer: Self-pay | Admitting: Surgery

## 2012-09-09 ENCOUNTER — Ambulatory Visit (INDEPENDENT_AMBULATORY_CARE_PROVIDER_SITE_OTHER): Payer: BC Managed Care – PPO | Admitting: Surgery

## 2012-09-09 ENCOUNTER — Other Ambulatory Visit (INDEPENDENT_AMBULATORY_CARE_PROVIDER_SITE_OTHER): Payer: Self-pay

## 2012-09-09 DIAGNOSIS — K2100 Gastro-esophageal reflux disease with esophagitis, without bleeding: Secondary | ICD-10-CM

## 2012-09-09 DIAGNOSIS — Z6841 Body Mass Index (BMI) 40.0 and over, adult: Secondary | ICD-10-CM

## 2012-09-09 DIAGNOSIS — F3289 Other specified depressive episodes: Secondary | ICD-10-CM

## 2012-09-09 DIAGNOSIS — F329 Major depressive disorder, single episode, unspecified: Secondary | ICD-10-CM

## 2012-09-09 NOTE — Progress Notes (Signed)
Chief Complaint:  Morbid obesity BMI of 61  History of Present Illness:  Alyssa Blanchard is an 42 y.o. female who is attended one of our seminars and after carefully researching this on the Internet and speaking with others is extubated and laparoscopic adjustable gastric banding. She has some good questions specific to the band and potential complications and expectations of weight loss. In addition I gave her the booklet that [employer] had provided regarding lap band. She has had obesity is a problem for most of her life but recently has gotten worse. This is caused her to have more problems with obstructive sleep apnea with which I think she likely has a low she has not been tested.  We discussed sleeve gastrectomy and gastric bypass briefly. She is not interested in these  She provided [place] with her letter requesting surgery and a reported weight loss history including multiple attempts in different year to lose weight. She is followed with the help of Lovie Macadamia at [facility].  I think she will be a very good candidate for laparoscopic adjustable gastric banding.  Past Medical History  Diagnosis Date  . Depression   . GERD (gastroesophageal reflux disease)   . Anxiety   . Asthma   . Migraines   . History of chicken pox   . STD (sexually transmitted disease)     chl hx & hsv 1&2    Past Surgical History  Procedure Laterality Date  . Tonsillectomy  1978    Current Outpatient Prescriptions  Medication Sig Dispense Refill  . albuterol (PROVENTIL HFA;VENTOLIN HFA) 108 (90 BASE) MCG/ACT inhaler Inhale 2 puffs into the lungs every 6 (six) hours as needed.  6.7 g  1  . ARIPiprazole (ABILIFY) 15 MG tablet Take 15 mg by mouth daily.      Marland Kitchen buPROPion (WELLBUTRIN SR) 150 MG 12 hr tablet Take 150 mg by mouth daily.       . Cholecalciferol (VITAMIN D-3) 1000 UNITS CAPS Take 1 capsule by mouth daily.      . famotidine (PEPCID) 20 MG tablet Take 20 mg by mouth 2 (two) times daily.      . fluticasone  (FLONASE) 50 MCG/ACT nasal spray Place 2 sprays into the nose daily.  16 g  6  . LORazepam (ATIVAN) 0.5 MG tablet Take 0.5 mg by mouth daily.      . Lurasidone HCl (LATUDA) 60 MG TABS Take 1 tablet by mouth daily.      . Multiple Vitamins-Minerals (MULTIVITAMIN WITH MINERALS) tablet Take 1 tablet by mouth daily.      . SUMAtriptan (IMITREX) 50 MG tablet Take 1 tablet (50 mg total) by mouth every 2 (two) hours as needed for migraine.  10 tablet  0  . traZODone (DESYREL) 150 MG tablet Take 150 mg by mouth at bedtime.      Marland Kitchen HYDROcodone-acetaminophen (NORCO) 5-325 MG per tablet Take 1 tablet by mouth every 6 (six) hours as needed for pain.  10 tablet  0  . promethazine (PHENERGAN) 25 MG tablet Take 0.5-1 tablets (12.5-25 mg total) by mouth every 8 (eight) hours as needed for nausea.  20 tablet  0  . vitamin B-12 (CYANOCOBALAMIN) 500 MCG tablet Take 500 mcg by mouth daily.       No current facility-administered medications for this visit.   Review of patient's allergies indicates no known allergies. Family History  Problem Relation Age of Onset  . Diabetes Mother   . Hypertension Mother   . Hyperlipidemia  Mother   . Kidney disease Mother   . Cancer Father     pancreatic  . Hypertension Maternal Grandmother   . Diabetes Maternal Grandmother   . Heart failure Maternal Grandmother     CHF  . Heart attack Maternal Grandfather   . Hypertension Maternal Grandfather   . Hypertension Paternal Grandmother   . Alzheimer's disease Paternal Grandmother   . Hypertension Paternal Grandfather   . Cancer Maternal Uncle     melanoma  . Cancer Paternal Uncle     kidney   Social History:   reports that she has quit smoking. She has never used smokeless tobacco. She reports that  drinks alcohol. She reports that she does not use illicit drugs.   REVIEW OF SYSTEMS - PERTINENT POSITIVES ONLY: No history of DVT or prior abdominal surgery  Physical Exam:   Blood pressure 122/78, pulse 76, resp. rate  16, height 5' 6.25" (1.683 m), weight 381 lb 3.2 oz (172.911 kg), last menstrual period 08/26/2012. Body mass index is 61.05 kg/(m^2).  Gen:  WDWN WF NAD  Neurological: Alert and oriented to person, place, and time. Motor and sensory function is grossly intact  Head: Normocephalic and atraumatic.  Eyes: Conjunctivae are normal. Pupils are equal, round, and reactive to light. No scleral icterus.  Neck: Normal range of motion. Neck supple. No tracheal deviation or thyromegaly present.  Cardiovascular:  SR without murmurs or gallops.  No carotid bruits Respiratory: Effort normal.  No respiratory distress. No chest wall tenderness. Breath sounds normal.  No wheezes, rales or rhonchi.  Abdomen:  Obese and nontender GU: Musculoskeletal: Normal range of motion. Extremities are nontender. No cyanosis, edema or clubbing noted Lymphadenopathy: No cervical, preauricular, postauricular or axillary adenopathy is present Skin: Skin is warm and dry. No rash noted. No diaphoresis. No erythema. No pallor. Pscyh: Normal mood and affect. Behavior is normal. Judgment and thought content normal.   LABORATORY RESULTS: No results found for this or any previous visit (from the past 48 hour(s)).  RADIOLOGY RESULTS: No results found.  Problem List: Patient Active Problem List   Diagnosis Date Noted  . Preventative health care 07/30/2012  . Morbid obesity 07/30/2012    Assessment & Plan: Morbid obesity, BMI 61 Will begin the journey toward Lapband placement.    Matt B. Daphine Deutscher, MD, [facility] [phone] beeper [phone]  09/09/2012 11:49 AM

## 2012-09-09 NOTE — Patient Instructions (Addendum)
Two weeks prior to surgery  Go on the extremely low carb liquid diet One week prior to surgery  No aspirin products.  Tylenol is acceptable  Stop smoking 24 hours prior to surgery  No alcoholic beverages  Report fever greater than 100.5 or excessive nasal drainage suggesting infection  Continue bariatric preop diet  Perform bowel prep if ordered  Do not eat or drink anything after midnight the night before surgery  Do not take any medications except those instructed by the anesthesiologist Morning of surgery  Please arrive at the hospital at least 2 hours before your scheduled surgery time.  No makeup, fingernail polish or jewelry  Bring insurance cards with you  Bring your CPAP mask if you use this    Laparoscopic Gastric Band Surgery Care After These instructions give you information on caring for yourself after your procedure. Your doctor may also give you more specific instructions. Call your doctor if you have any problems or questions after your procedure. HOME CARE   Take walks throughout the day. Do not sit for longer than 1 hour while awake for 4 to 6 weeks.  You may shower 2 days after surgery. Pat the surgery cuts (incisions) dry. Do not rub the surgery cuts.  Do your coughing and deep breathing exercises.  Do not lift, push, or pull anything heavy until your doctor says it is okay.  Only take medicines as told by your doctor. Do not drive while taking pain medicine.  Drink plenty of fluids to keep your pee (urine) clear or pale yellow.  Stay on a clear liquid diet as long as your doctor tells you to.  Do not drink caffeine for 1 month.  Change bandages (dressings) as told by your doctor.  Check your surgery cuts for redness, pufffiness (swelling), abnormal coloring, fluid, or bleeding.  Follow your doctor's advice about vitamin and protein needs after surgery. GET HELP RIGHT AWAY IF:  You feel sick to your stomach (nauseous) and throw up (vomit).  You  have pain and discomfort with swallowing.  You develop shortness of breath or difficulty breathing.  You have pain, puffiness, or feel warmth on your lower body.  You have very bad calf pain or pain not relieved by medicine.  You have a temperature by mouth above 102 F (38.9 C).  Your surgery cuts look red, puffy, or they leak fluid.  Your poop (stool) is black, tarry, or dark red.  You have chills.  You have chest pain.  You feel confused.  You have slurred speech.  You feel lightheaded when standing.  You suddenly feel weak.  You have any questions or concerns. MAKE SURE YOU:   Understand these instructions.  Will watch your condition.  Will get help right away if you are not doing well or get worse. Document Released: 03/29/2010 Document Revised: 05/19/2011 Document Reviewed: 03/29/2010 [facility] Patient Information 2014 [place], [place].

## 2012-09-17 ENCOUNTER — Ambulatory Visit: Payer: BC Managed Care – PPO

## 2012-09-20 ENCOUNTER — Ambulatory Visit: Payer: BC Managed Care – PPO | Admitting: *Deleted

## 2012-09-20 ENCOUNTER — Telehealth: Payer: Self-pay | Admitting: *Deleted

## 2012-09-20 NOTE — Telephone Encounter (Signed)
[facility] Triage Call Report Triage Record Num: [id number] Operator: Peri Jefferson Patient Name: Alyssa Blanchard Call Date & Time: 09/02/2012 1:49:23PM Patient Phone: [phone] PCP: Nicki Reaper Patient Gender: Female PCP Fax : Patient DOB: 1970-06-11 Practice Name: [facility] Reason for Call: Triage completed by Cornell Barman, RN on 08/31/2012 at 5:04pm. Note was sent to [facility] location since she was seen there that day. Per [place] [facility] [place], note should be sent to [facility] location. Caller: Alyssa Blanchard/Patient; PCP: Adline Mango (Family Practice); CB#: [phone]; Call regarding Migraine H/A. Patient states she had a shot of Toradol 60mg  earlier in the office for a migraine and picked up nausea medication as well . Seen today 08/31/12 at 14:30 for a Migraine that started at 05:00 a.m this morning 08/31/12. Patient states she is not any better. Described as "throbbing middle of middle/above forehead . LMP- 08/26/12. At home treatment Excedrin and ASA. New onset of neck stiffness, Afebrile. Emergent s/sx ruled out per Headache protocol with the exception to "New onset of headache and unrelieved with 4 hours of home care". See provider in 4 hours. Patient referred to [facility] cone for care. understanding expressed. Protocol(s) Used: Headache Recommended Outcome per Protocol: See Provider within 4 hours Reason for Outcome: New onset of severe headache unrelieved with 4 hours of home care Care Advice: ~ 06/

## 2012-09-21 ENCOUNTER — Ambulatory Visit (HOSPITAL_COMMUNITY): Admission: RE | Admit: 2012-09-21 | Payer: BC Managed Care – PPO | Source: Ambulatory Visit | Admitting: Surgery

## 2012-09-21 ENCOUNTER — Encounter (HOSPITAL_COMMUNITY): Admission: RE | Payer: Self-pay | Source: Ambulatory Visit

## 2012-09-21 SURGERY — BREATH TEST, FOR HELICOBACTER PYLORI

## 2012-10-06 ENCOUNTER — Ambulatory Visit: Payer: BC Managed Care – PPO

## 2012-10-12 ENCOUNTER — Ambulatory Visit (HOSPITAL_COMMUNITY): Admission: RE | Admit: 2012-10-12 | Payer: BC Managed Care – PPO | Source: Ambulatory Visit

## 2012-10-12 ENCOUNTER — Ambulatory Visit (HOSPITAL_COMMUNITY): Payer: BC Managed Care – PPO

## 2012-10-12 ENCOUNTER — Other Ambulatory Visit (HOSPITAL_COMMUNITY): Payer: BC Managed Care – PPO

## 2012-10-13 ENCOUNTER — Encounter (HOSPITAL_BASED_OUTPATIENT_CLINIC_OR_DEPARTMENT_OTHER): Payer: BC Managed Care – PPO

## 2012-10-16 ENCOUNTER — Other Ambulatory Visit: Payer: Self-pay | Admitting: Gynecology

## 2012-10-18 ENCOUNTER — Ambulatory Visit: Payer: BC Managed Care – PPO | Admitting: *Deleted

## 2012-10-18 ENCOUNTER — Telehealth: Payer: Self-pay | Admitting: Gynecology

## 2012-10-18 NOTE — Telephone Encounter (Addendum)
Patient needs to reschedule her appointment for tomorrow. PUS. With Dr. Farrel Gobble.  Needs an appointment on the 26 th.

## 2012-10-19 ENCOUNTER — Other Ambulatory Visit: Payer: BC Managed Care – PPO

## 2012-10-19 ENCOUNTER — Other Ambulatory Visit: Payer: BC Managed Care – PPO | Admitting: Gynecology

## 2012-10-25 NOTE — Telephone Encounter (Signed)
Call to patient to assist with resched ultrasound appt. LMTCB and ask for Kennon Rounds or [employer].

## 2012-10-29 NOTE — Telephone Encounter (Signed)
LMTCB regarding reschedule.

## 2012-11-02 NOTE — Telephone Encounter (Signed)
LMTCB to reschedule PUS.  °

## 2012-11-11 ENCOUNTER — Telehealth: Payer: Self-pay | Admitting: Gynecology

## 2012-11-11 NOTE — Telephone Encounter (Signed)
LMTCB to reschedule PUS.  °

## 2012-11-16 NOTE — Telephone Encounter (Signed)
Has patient called to rescheule?

## 2012-11-17 NOTE — Telephone Encounter (Signed)
Patient still has not called to reschedule? How should I proceed?

## 2012-11-17 NOTE — Telephone Encounter (Signed)
PUS was ordered due to limited pelvic exam at [facility] due to body habitus. Patient has caneled and has mot returned call after multiple attempts.  Any further follow up needed?

## 2012-11-23 NOTE — Telephone Encounter (Signed)
Per Debbi and Dr. Farrel Gobble, okay to stop calling. Patient will call when she is ready.

## 2013-01-13 ENCOUNTER — Other Ambulatory Visit: Payer: Self-pay

## 2013-02-18 ENCOUNTER — Ambulatory Visit (INDEPENDENT_AMBULATORY_CARE_PROVIDER_SITE_OTHER): Payer: BC Managed Care – PPO | Admitting: Internal Medicine

## 2013-02-18 ENCOUNTER — Ambulatory Visit: Payer: BC Managed Care – PPO | Admitting: Nurse Practitioner

## 2013-02-18 VITALS — BP 132/83 | HR 98 | Temp 98.2°F | Resp 18 | Wt 397.0 lb

## 2013-02-18 DIAGNOSIS — A088 Other specified intestinal infections: Secondary | ICD-10-CM

## 2013-02-18 MED ORDER — DICYCLOMINE HCL 20 MG PO TABS: 20.0000 mg | ORAL_TABLET | Freq: Three times a day (TID) | ORAL | Status: DC

## 2013-02-18 MED ORDER — ONDANSETRON HCL 4 MG PO TABS: 4.0000 mg | ORAL_TABLET | Freq: Three times a day (TID) | ORAL | Status: DC | PRN

## 2013-02-18 NOTE — Progress Notes (Signed)
Subjective:  This chart was scribed for Alyssa Blanchard , MD by Andrew Au, ED Scribe. This patient was seen in room 4 and the patient's care was started at 704 PM.   Patient ID: Alyssa Blanchard, female    DOB: 1970/05/07, 41 y.o.   MRN: [id number]  HPI This chart was scribed for Alyssa Blanchard by Andrew Au, Scribe. This patient was seen in room 5 and the patient's care was started at 7:04 PM.  HPI Comments: Alyssa Blanchard is a 42 y.o. female who presents to the [facility] complaining of constant nausea and diarrhea (episodes w/nearly every BM for 5 days), onset 5 days ago.  She states that her nausea and diarrhea episodes are worse after eating. She also reports that she experiences cramping with diarrhea which result in fatigue. Pt reports she ate 2 pieces sushi with her friends who have not experienced similar symptoms recently. She denies emesis, fever, cold sweats, or trouble urinating.  She also has history of GERD.  Past Medical History  Diagnosis Date  . Depression   . GERD (gastroesophageal reflux disease)   . Anxiety   . Asthma   . Migraines   . History of chicken pox   . STD (sexually transmitted disease)     chl hx & hsv 1&2   Past Surgical History  Procedure Laterality Date  . Tonsillectomy  1978   Family History  Problem Relation Age of Onset  . Diabetes Mother   . Hypertension Mother   . Hyperlipidemia Mother   . Kidney disease Mother   . Cancer Father     pancreatic  . Hypertension Maternal Grandmother   . Diabetes Maternal Grandmother   . Heart failure Maternal Grandmother     CHF  . Heart attack Maternal Grandfather   . Hypertension Maternal Grandfather   . Hypertension Paternal Grandmother   . Alzheimer's disease Paternal Grandmother   . Hypertension Paternal Grandfather   . Cancer Maternal Uncle     melanoma  . Cancer Paternal Uncle     kidney   History   Social History  . Marital Status: Single    Spouse Name: N/A   Number of Children: N/A  . Years of Education: 12   Occupational History  . [employer]   Social History Main Topics  . Smoking status: Former Games developer  . Smokeless tobacco: Never Used  . Alcohol Use: Yes     Comment: 1 a month  . Drug Use: No  . Sexual Activity: Yes    Partners: Male    Birth Control/ Protection: Condom   Other Topics Concern  . Not on file   Social History Narrative   Regular exercise-no   Caffeine Use-yes   No Known Allergies Patient Active Problem List   Diagnosis Date Noted  . Preventative health care 07/30/2012  . Morbid obesity 07/30/2012   No orders of the defined types were placed in this encounter.    Review of Systems  Constitutional: Positive for fatigue. Negative for fever and chills.  HENT: Negative for rhinorrhea.   Respiratory: Negative for cough and shortness of breath.   Cardiovascular: Negative for chest pain.  Gastrointestinal: Positive for nausea and diarrhea. Negative for vomiting, abdominal pain and constipation.  Genitourinary: Negative for dysuria.  Musculoskeletal: Negative for back pain.  Skin: Negative for color change.      Objective:   Physical Exam  Nursing note and vitals reviewed. Constitutional: She is oriented to  person, place, and time. She appears well-developed and well-nourished. No distress.  Obese  HENT:  Head: Normocephalic and atraumatic.  Eyes: Conjunctivae are normal. Pupils are equal, round, and reactive to light.  Neck: Normal range of motion.  Cardiovascular: Normal rate.   Pulmonary/Chest: Effort normal. No respiratory distress.  Abdominal: Soft. Bowel sounds are normal. She exhibits no distension and no mass. There is no tenderness. There is no rebound and no guarding.  Musculoskeletal: She exhibits no edema.  Neurological: She is alert and oriented to person, place, and time.  Skin: Skin is warm and dry.  Psychiatric: She has a normal mood and affect.   Triage Vitals: BP  132/83  Pulse 98  Temp(Src) 98.2 F (36.8 C) (Oral)  Resp 18  Wt 397 lb (180.078 kg)  SpO2 98%  LMP 02/05/2013     Assessment & Plan:  I have completed the patient encounter in its entirety as documented by the scribe, with editing by me where necessary. Lachell Rochette P. Merla Riches, M.D. Intestinal infection due to other organism, not elsewhere classified  Meds ordered this encounter  Medications  . dicyclomine (BENTYL) 20 MG tablet    Sig: Take 1 tablet (20 mg total) by mouth 4 (four) times daily -  before meals and at bedtime.    Dispense:  20 tablet    Refill:  0  . ondansetron (ZOFRAN) 4 MG tablet    Sig: Take 1 tablet (4 mg total) by mouth every 8 (eight) hours as needed for nausea or vomiting.    Dispense:  15 tablet    Refill:  0    Stool culture if not improving in 3-5 days

## 2013-07-09 ENCOUNTER — Ambulatory Visit (INDEPENDENT_AMBULATORY_CARE_PROVIDER_SITE_OTHER): Payer: BC Managed Care – PPO | Admitting: Family Medicine

## 2013-07-09 VITALS — BP 122/86 | HR 79 | Temp 98.0°F | Resp 16 | Ht 66.0 in | Wt >= 6400 oz

## 2013-07-09 DIAGNOSIS — J040 Acute laryngitis: Secondary | ICD-10-CM

## 2013-07-09 DIAGNOSIS — J45901 Unspecified asthma with (acute) exacerbation: Secondary | ICD-10-CM

## 2013-07-09 DIAGNOSIS — J069 Acute upper respiratory infection, unspecified: Secondary | ICD-10-CM

## 2013-07-09 MED ORDER — HYDROCODONE-HOMATROPINE 5-1.5 MG/5ML PO SYRP: 5.0000 mL | ORAL_SOLUTION | Freq: Three times a day (TID) | ORAL | Status: DC | PRN

## 2013-07-09 MED ORDER — ALBUTEROL SULFATE HFA 108 (90 BASE) MCG/ACT IN AERS: 2.0000 | INHALATION_SPRAY | Freq: Four times a day (QID) | RESPIRATORY_TRACT | Status: DC | PRN

## 2013-07-09 MED ORDER — AZITHROMYCIN 250 MG PO TABS: ORAL_TABLET | ORAL | Status: DC

## 2013-07-09 NOTE — Patient Instructions (Signed)

## 2013-07-09 NOTE — Progress Notes (Signed)
° °  Subjective:  This chart was scribed for Elvina SidleKurt Lauenstein, MD by Elveria Risingimelie Horne, Medial Scribe. This patient was seen in room 4 and the patient's care was started at 11:16 AM.    Patient ID: Alyssa Blanchard, female    DOB: [birth year], 43 y.o.   MRN: [id number]  HPI HPI Comments: Alyssa Blanchard is a 43 y.o. female with history of asthma who presents to the [facility] with hoarse voice, complaining of congestion, sore throat, and cough, ongoing for three days. Patient reports historical relief of similar symptoms with Z-pack.  Patient reports that she needs a new prescription for her inhaler.    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     Review of Systems  HENT: Positive for congestion and voice change.   Respiratory: Positive for cough.        Objective:   Physical Exam Morbidly obese young woman in no acute distress Patient is hoarse HEENT: Unremarkable Neck: Supple no adenopathy Chest: Few expiratory wheezes Heart: Regular no murmur Skin: Very mild acne on the face, otherwise no rash    Assessment & Plan:  URI, acute - Plan: HYDROcodone-homatropine (HYCODAN) 5-1.5 MG/5ML syrup  Laryngitis - Plan: HYDROcodone-homatropine (HYCODAN) 5-1.5 MG/5ML syrup  Asthma with acute exacerbation - Plan: albuterol (PROVENTIL HFA;VENTOLIN HFA) 108 (90 BASE) MCG/ACT inhaler, HYDROcodone-homatropine (HYCODAN) 5-1.5 MG/5ML syrup, azithromycin (ZITHROMAX Z-PAK) 250 MG tablet  Signed, Elvina SidleKurt Lauenstein, MD

## 2013-07-11 ENCOUNTER — Other Ambulatory Visit: Payer: Self-pay | Admitting: Physician Assistant

## 2013-07-11 ENCOUNTER — Other Ambulatory Visit: Payer: Self-pay | Admitting: Family Medicine

## 2013-07-11 ENCOUNTER — Telehealth: Payer: Self-pay

## 2013-07-11 MED ORDER — BENZONATATE 100 MG PO CAPS: 100.0000 mg | ORAL_CAPSULE | Freq: Three times a day (TID) | ORAL | Status: DC | PRN

## 2013-07-11 NOTE — Telephone Encounter (Signed)
PT REQUESTING CALL BACK SINCE SHE WAS TOLD TO CALL IF NOT BETTER   BEST PHONE NUMBER FOR PT IS [phone]   Pharmacy  [facility] [address]

## 2013-07-11 NOTE — Telephone Encounter (Signed)
Patient is calling again for tessalon pearls.  Advised her that it can take up to 24-48 hrs to get a response on a medication change.  She verbalized understanding.

## 2013-07-11 NOTE — Telephone Encounter (Signed)
Patient states that she needs some tessalon pearls for daytime cough. Was seen in our office Saturday. [facility] [address] [address]

## 2013-07-11 NOTE — Telephone Encounter (Signed)
Original message was sent to PA Pool this morning.

## 2013-07-11 NOTE — Telephone Encounter (Signed)
Meds ordered this encounter  Medications  . benzonatate (TESSALON) 100 MG capsule    Sig: Take 1-2 capsules (100-200 mg total) by mouth 3 (three) times daily as needed for cough.    Dispense:  40 capsule    Refill:  0    Order Specific Question:  Supervising Provider    Answer:  DOOLITTLE, ROBERT P [[id number]]    

## 2013-07-12 ENCOUNTER — Ambulatory Visit (INDEPENDENT_AMBULATORY_CARE_PROVIDER_SITE_OTHER): Payer: BC Managed Care – PPO | Admitting: Family Medicine

## 2013-07-12 VITALS — BP 130/80 | HR 82 | Temp 98.0°F | Resp 16 | Ht 66.0 in | Wt >= 6400 oz

## 2013-07-12 DIAGNOSIS — J45901 Unspecified asthma with (acute) exacerbation: Secondary | ICD-10-CM

## 2013-07-12 DIAGNOSIS — J069 Acute upper respiratory infection, unspecified: Secondary | ICD-10-CM

## 2013-07-12 DIAGNOSIS — J040 Acute laryngitis: Secondary | ICD-10-CM

## 2013-07-12 MED ORDER — HYDROCODONE-HOMATROPINE 5-1.5 MG/5ML PO SYRP: 5.0000 mL | ORAL_SOLUTION | Freq: Three times a day (TID) | ORAL | Status: DC | PRN

## 2013-07-12 MED ORDER — PREDNISONE 20 MG PO TABS: ORAL_TABLET | ORAL | Status: DC

## 2013-07-12 NOTE — Telephone Encounter (Signed)
Spoke to patient and told her her RX was ready to be picked up.

## 2013-07-12 NOTE — Patient Instructions (Signed)
Asthma, Adult Asthma is a recurring condition in which the airways tighten and narrow. Asthma can make it difficult to breathe. It can cause coughing, wheezing, and shortness of breath. Asthma episodes (also called asthma attacks) range from minor to life-threatening. Asthma cannot be cured, but medicines and lifestyle changes can help control it. CAUSES Asthma is believed to be caused by inherited (genetic) and environmental factors, but its exact cause is unknown. Asthma may be triggered by allergens, lung infections, or irritants in the air. Asthma triggers are different for each person. Common triggers include:   Animal dander.  Dust mites.  Cockroaches.  Pollen from trees or grass.  Mold.  Smoke.  Air pollutants such as dust, household cleaners, hair sprays, aerosol sprays, paint fumes, strong chemicals, or strong odors.  Cold air, weather changes, and winds (which increase molds and pollens in the air).  Strong emotional expressions such as crying or laughing hard.  Stress.  Certain medicines (such as aspirin) or types of drugs (such as beta-blockers).  Sulfites in foods and drinks. Foods and drinks that may contain sulfites include dried fruit, potato chips, and sparkling grape juice.  Infections or inflammatory conditions such as the flu, a cold, or an inflammation of the nasal membranes (rhinitis).  Gastroesophageal reflux disease (GERD).  Exercise or strenuous activity. SYMPTOMS Symptoms may occur immediately after asthma is triggered or many hours later. Symptoms include:  Wheezing.  Excessive nighttime or early morning coughing.  Frequent or severe coughing with a common cold.  Chest tightness.  Shortness of breath. DIAGNOSIS  The diagnosis of asthma is made by a review of your medical history and a physical exam. Tests may also be performed. These may include:  Lung function studies. These tests show how much air you breath in and out.  Allergy  tests.  Imaging tests such as X-rays. TREATMENT  Asthma cannot be cured, but it can usually be controlled. Treatment involves identifying and avoiding your asthma triggers. It also involves medicines. There are 2 classes of medicine used for asthma treatment:   Controller medicines. These prevent asthma symptoms from occurring. They are usually taken every day.  Reliever or rescue medicines. These quickly relieve asthma symptoms. They are used as needed and provide short-term relief. Your health care provider will help you create an asthma action plan. An asthma action plan is a written plan for managing and treating your asthma attacks. It includes a list of your asthma triggers and how they may be avoided. It also includes information on when medicines should be taken and when their dosage should be changed. An action plan may also involve the use of a device called a peak flow meter. A peak flow meter measures how well the lungs are working. It helps you monitor your condition. HOME CARE INSTRUCTIONS   Take medicine as directed by your health care provider. Speak with your health care provider if you have questions about how or when to take the medicines.  Use a peak flow meter as directed by your health care provider. Record and keep track of readings.  Understand and use the action plan to help minimize or stop an asthma attack without needing to seek medical care.  Control your home environment in the following ways to help prevent asthma attacks:  Do not smoke. Avoid being exposed to secondhand smoke.  Change your heating and air conditioning filter regularly.  Limit your use of fireplaces and wood stoves.  Get rid of pests (such as roaches and   mice) and their droppings.  Throw away plants if you see mold on them.  Clean your floors and dust regularly. Use unscented cleaning products.  Try to have someone else vacuum for you regularly. Stay out of rooms while they are being  vacuumed and for a short while afterward. If you vacuum, use a dust mask from a hardware store, a double-layered or microfilter vacuum cleaner bag, or a vacuum cleaner with a HEPA filter.  Replace carpet with wood, tile, or vinyl flooring. Carpet can trap dander and dust.  Use allergy-proof pillows, mattress covers, and box spring covers.  Wash bed sheets and blankets every week in hot water and dry them in a dryer.  Use blankets that are made of polyester or cotton.  Clean bathrooms and kitchens with bleach. If possible, have someone repaint the walls in these rooms with mold-resistant paint. Keep out of the rooms that are being cleaned and painted.  Wash hands frequently. SEEK MEDICAL CARE IF:   You have wheezing, shortness of breath, or a cough even if taking medicine to prevent attacks.  The colored mucus you cough up (sputum) is thicker than usual.  Your sputum changes from clear or white to yellow, green, gray, or bloody.  You have any problems that may be related to the medicines you are taking (such as a rash, itching, swelling, or trouble breathing).  You are using a reliever medicine more than 2 3 times per week.  Your peak flow is still at 50 79% of you personal best after following your action plan for 1 hour. SEEK IMMEDIATE MEDICAL CARE IF:   You seem to be getting worse and are unresponsive to treatment during an asthma attack.  You are short of breath even at rest.  You get short of breath when doing very little physical activity.  You have difficulty eating, drinking, or talking due to asthma symptoms.  You develop chest pain.  You develop a fast heartbeat.  You have a bluish color to your lips or fingernails.  You are lightheaded, dizzy, or faint.  Your peak flow is less than 50% of your personal best.  You have a fever or persistent symptoms for more than 2 3 days.  You have a fever and symptoms suddenly get worse. MAKE SURE YOU:   Understand these  instructions.  Will watch your condition.  Will get help right away if you are not doing well or get worse. Document Released: 02/24/2005 Document Revised: 10/27/2012 Document Reviewed: 09/23/2012 ExitCare Patient Information 2014 [employer].  

## 2013-07-12 NOTE — Progress Notes (Signed)
° °  Subjective:    Patient ID: Alyssa Blanchard, female    DOB: Oct 25, 1970, 43 y.o.   MRN: [id number]  This chart was scribed for Elvina SidleKurt Lauenstein, MD by Jarvis Morganaylor Ferguson, Medical Scribe. This patient was seen in room Room 10 and the patient's care was started at 11:14 AM   URI  Associated symptoms include coughing.   HPI Comments: Alyssa Blanchard is a 43 y.o. female with a history of asthma who presents to the [facility] for a follow up of a recent URI. Marland Kitchen.Patient previously came in on 07/09/13 with complaints of congestion,sore throat, and persistent cough. Patient states that the antibiotics has helped resolved most of her symptoms, however, she states that she still does have a persistent cough. She also states that she has developed rib and back pain which she believes is due to the cough. Patient reports that she has yet to return to work.  Patient works at [employer]   Review of Systems  Respiratory: Positive for cough.   Musculoskeletal: Positive for back pain.       Objective:   Physical Exam  Nursing note and vitals reviewed. Constitutional: She appears well-developed and well-nourished. No distress.  Morbidly Obese  HENT:  Head: Normocephalic and atraumatic.  Throat is mildly erythematous   Neck: Neck supple.  Pulmonary/Chest: Effort normal. She has wheezes.  Neurological: She is alert.  Skin: She is not diaphoretic.       Assessment & Plan:  With persistent cough, the patient will do better with little more cough medicine to be taken at night along with a little prednisone to help with the wheezing.  Overall I think she is doing better.  Signed, Sheila OatsKurt Lauenstein M.D.

## 2013-07-22 ENCOUNTER — Other Ambulatory Visit: Payer: Self-pay | Admitting: Physician Assistant

## 2013-07-22 NOTE — Telephone Encounter (Signed)
Dr L, do you want to give pt a RF?

## 2013-09-01 ENCOUNTER — Ambulatory Visit: Payer: BC Managed Care – PPO | Admitting: Certified Nurse Midwife

## 2013-09-21 ENCOUNTER — Ambulatory Visit (INDEPENDENT_AMBULATORY_CARE_PROVIDER_SITE_OTHER): Payer: BC Managed Care – PPO

## 2013-09-21 VITALS — BP 173/102 | HR 76 | Resp 18 | Ht 66.0 in | Wt >= 6400 oz

## 2013-09-21 DIAGNOSIS — M722 Plantar fascial fibromatosis: Secondary | ICD-10-CM

## 2013-09-21 DIAGNOSIS — M79609 Pain in unspecified limb: Secondary | ICD-10-CM

## 2013-09-21 DIAGNOSIS — M79673 Pain in unspecified foot: Secondary | ICD-10-CM

## 2013-09-21 DIAGNOSIS — M7750 Other enthesopathy of unspecified foot: Secondary | ICD-10-CM

## 2013-09-21 DIAGNOSIS — M7730 Calcaneal spur, unspecified foot: Secondary | ICD-10-CM

## 2013-09-21 DIAGNOSIS — M779 Enthesopathy, unspecified: Secondary | ICD-10-CM

## 2013-09-21 DIAGNOSIS — M778 Other enthesopathies, not elsewhere classified: Secondary | ICD-10-CM

## 2013-09-21 MED ORDER — MELOXICAM 15 MG PO TABS: 15.0000 mg | ORAL_TABLET | Freq: Every day | ORAL | Status: DC

## 2013-09-21 NOTE — Patient Instructions (Signed)
Plantar Fasciitis Plantar fasciitis is a common condition that causes foot pain. It is soreness (inflammation) of the band of tough fibrous tissue on the bottom of the foot that runs from the heel bone (calcaneus) to the ball of the foot. The cause of this soreness may be from excessive standing, poor fitting shoes, running on hard surfaces, being overweight, having an abnormal walk, or overuse (this is common in runners) of the painful foot or feet. It is also common in aerobic exercise dancers and ballet dancers. SYMPTOMS  Most people with plantar fasciitis complain of:  Severe pain in the morning on the bottom of their foot especially when taking the first steps out of bed. This pain recedes after a few minutes of walking.  Severe pain is experienced also during walking following a long period of inactivity.  Pain is worse when walking barefoot or up stairs DIAGNOSIS   Your caregiver will diagnose this condition by examining and feeling your foot.  Special tests such as X-rays of your foot, are usually not needed. PREVENTION   Consult a sports medicine professional before beginning a new exercise program.  Walking programs offer a good workout. With walking there is a lower chance of overuse injuries common to runners. There is less impact and less jarring of the joints.  Begin all new exercise programs slowly. If problems or pain develop, decrease the amount of time or distance until you are at a comfortable level.  Wear good shoes and replace them regularly.  Stretch your foot and the heel cords at the back of the ankle (Achilles tendon) both before and after exercise.  Run or exercise on even surfaces that are not hard. For example, asphalt is better than pavement.  Do not run barefoot on hard surfaces.  If using a treadmill, vary the incline.  Do not continue to workout if you have foot or joint problems. Seek professional help if they do not improve. HOME CARE INSTRUCTIONS     Avoid activities that cause you pain until you recover.  Use ice or cold packs on the problem or painful areas after working out.  Only take over-the-counter or prescription medicines for pain, discomfort, or fever as directed by your caregiver.  Soft shoe inserts or athletic shoes with air or gel sole cushions may be helpful.  If problems continue or become more severe, consult a sports medicine caregiver or your own health care provider. Cortisone is a potent anti-inflammatory medication that may be injected into the painful area. You can discuss this treatment with your caregiver. MAKE SURE YOU:   Understand these instructions.  Will watch your condition.  Will get help right away if you are not doing well or get worse. Document Released: 11/19/2000 Document Revised: 05/19/2011 Document Reviewed: 01/19/2008 ExitCare Patient Information 2015 [employer]. This information is not intended to replace advice given to you by your health care provider. Make sure you discuss any questions you have with your health care provider.    ICE INSTRUCTIONS  Apply ice or cold pack to the affected area at least 3 times a day for 10-15 minutes each time.  You should also use ice after prolonged activity or vigorous exercise.  Do not apply ice longer than 20 minutes at one time.  Always keep a cloth between your skin and the ice pack to prevent burns.  Being consistent and following these instructions will help control your symptoms.  We suggest you purchase a gel ice pack because they are   reusable and do bit leak.  Some of them are designed to wrap around the area.  Use the method that works best for you.  Here are some other suggestions for icing.   Use a frozen bag of peas or corn-inexpensive and molds well to your body, usually stays frozen for 10 to 20 minutes.  Wet a towel with cold water and squeeze out the excess until it's damp.  Place in a bag in the freezer for 20 minutes. Then remove  and use. 

## 2013-09-21 NOTE — Progress Notes (Signed)
   Subjective:    Patient ID: Alyssa Blanchard, female    DOB: Jul 25, 1970, 43 y.o.   MRN: [id number]  HPI Comments: N heel pain L B/L heel and inferior arch D 3 months, and worsened over the last 1 month O 14 hours of standing for 2 days C sharp shooting pain, and dull ache if rested A weight bearing, 4 hours of standing, and after resting and in the morning T icing, massage, and stretching  Pt complains of sharp stabbing pain in the left 4, 5th metatarsal constantly, and especially when dorsiflexing to step.  Pt states the pain has been going on for 3 weeks.  Foot Pain      Review of Systems  Allergic/Immunologic: Positive for environmental allergies.  All other systems reviewed and are negative.      Objective:   Physical Exam 43 year old female well-developed oriented x3 considerably overweight presents at this time with bilateral foot and heel pain. Has bilateral heel and inferior arch pain as well as some forefoot pain lateral fourth and fifth toe or MTP area as on the left foot possibly due to history gait changes.  Lower extremity objective findings as follows vascular status is intact pedal pulses palpable DP +2/4 bilateral PT one over 4 bilateral capillary refill time 3 seconds all digits. Epicritic and proprioceptive sensations intact and symmetric bilateral normal plantar response. DTRs not elicited dermatologically skin color pigment normal hair growth absent nails unremarkable orthopedic biomechanical exam rectus foot type mild promontory changes noted bilateral patient is currently wearing flip-flop type shoes and walks barefoot around the house has pain on palpation of the medial band of the plantar fascia from mid arch to medial calcaneal tubercle area bilateral x-rays reveal well-developed inferior calcaneal spurring thickening the plantar fascia no fractures cyst or tumors or other osseous abnormalities identified dermatologically no open wounds ulcerations nails  unremarkable diffuse calluses sub-first MTP area bilateral       Assessment & Plan:  Assessment this time is plantar fasciitis/heel spur syndrome bilateral with possible capsulitis and bursitis fourth and fifth MTP area left secondary to compensatory gait changes. No signs of fracture noted on x-ray at this time fascial strapping applied to both feet prescription for meloxicam is given we'll discontinue nabumetone reappointed in 2-3 weeks for followup and reevaluation recommended ice and MOBIC and maintain a good stable athletic or walking shoe suggested crocs for around the house dance toe shoes or findings also new balance or Brooks athletic shoes would be beneficial recheck in 2-3 weeks  Alyssa Blanchard DPM

## 2013-10-05 ENCOUNTER — Ambulatory Visit (INDEPENDENT_AMBULATORY_CARE_PROVIDER_SITE_OTHER): Payer: BC Managed Care – PPO

## 2013-10-05 VITALS — BP 184/94 | HR 93 | Resp 18 | Ht 66.0 in | Wt >= 6400 oz

## 2013-10-05 DIAGNOSIS — M79673 Pain in unspecified foot: Secondary | ICD-10-CM

## 2013-10-05 DIAGNOSIS — M779 Enthesopathy, unspecified: Secondary | ICD-10-CM

## 2013-10-05 DIAGNOSIS — M7750 Other enthesopathy of unspecified foot: Secondary | ICD-10-CM

## 2013-10-05 DIAGNOSIS — M79609 Pain in unspecified limb: Secondary | ICD-10-CM

## 2013-10-05 DIAGNOSIS — M7730 Calcaneal spur, unspecified foot: Secondary | ICD-10-CM

## 2013-10-05 DIAGNOSIS — M722 Plantar fascial fibromatosis: Secondary | ICD-10-CM

## 2013-10-05 DIAGNOSIS — M778 Other enthesopathies, not elsewhere classified: Secondary | ICD-10-CM

## 2013-10-05 MED ORDER — TRIAMCINOLONE ACETONIDE 10 MG/ML IJ SUSP: 10.0000 mg | Freq: Once | INTRAMUSCULAR | Status: DC

## 2013-10-05 NOTE — Patient Instructions (Signed)

## 2013-10-05 NOTE — Progress Notes (Signed)
   Subjective:    Patient ID: Alyssa Blanchard, female    DOB: [birth year], 43 y.o.   MRN: [id number]  HPI Comments: Pt states she really hurts and would like to have the cortisone injections.     Review of Systems no new findings or systemic changes noted     Objective:   Physical Exam Neurovascular status is intact and unchanged patient cases hurting worse and she felt better Will the tape was on for 5 days and then remove the day to removal tape the pain returned and seems to be worse in the mornings. Pain on palpation medial band plantar fascia bilateral. Patient these have intact neurovascular status no complications request steroid injections at this time help alleviate symptoms. Patient is also strong candidate for orthoses based on improvement with fascial strapping       Assessment & Plan:  Assessment plantar fasciitis/heel spur syndrome bilateral injection tender with Kenalog and Xylocaine plain infiltrated to the inferior calcaneal tubercle area bilateral at the plantar fascial insertion site. A Charnley injections well and orthotic scan is also carried out at today's visit will followup in 3-4 weeks for orthotic fitting and dispensing when ready. Following the skin and fascial strapping for applied to both feet patient will maintain NSAID therapy and ice therapy as instructed. Followup in one month for orthotic fitting and dispensing. Contact us in changes or exacerbations in the interim.  Alvan Dameichard Berthel Bagnall DPM

## 2013-10-28 ENCOUNTER — Other Ambulatory Visit: Payer: BC Managed Care – PPO

## 2013-11-22 ENCOUNTER — Other Ambulatory Visit: Payer: Self-pay

## 2013-11-22 NOTE — Telephone Encounter (Signed)
Need a follow-up appointment with Dr. Ralene Cork.

## 2013-12-14 ENCOUNTER — Other Ambulatory Visit: Payer: Self-pay

## 2013-12-23 ENCOUNTER — Other Ambulatory Visit: Payer: Self-pay

## 2014-02-09 ENCOUNTER — Other Ambulatory Visit: Payer: Self-pay

## 2014-02-22 ENCOUNTER — Other Ambulatory Visit: Payer: Self-pay | Admitting: *Deleted

## 2014-02-22 NOTE — Telephone Encounter (Signed)
Dr. Jake SeatsSikora okayed a refill request for Meloxicam 15 mg, quantity: 30, plus 2 additional refills.  Take one tablet by mouth daily.

## 2014-05-15 ENCOUNTER — Ambulatory Visit (INDEPENDENT_AMBULATORY_CARE_PROVIDER_SITE_OTHER): Payer: BLUE CROSS/BLUE SHIELD | Admitting: Emergency Medicine

## 2014-05-15 VITALS — BP 132/84 | HR 87 | Temp 98.2°F | Resp 18 | Ht 67.0 in | Wt 382.0 lb

## 2014-05-15 DIAGNOSIS — J40 Bronchitis, not specified as acute or chronic: Secondary | ICD-10-CM | POA: Diagnosis not present

## 2014-05-15 DIAGNOSIS — J0140 Acute pansinusitis, unspecified: Secondary | ICD-10-CM | POA: Diagnosis not present

## 2014-05-15 DIAGNOSIS — G4733 Obstructive sleep apnea (adult) (pediatric): Secondary | ICD-10-CM

## 2014-05-15 DIAGNOSIS — J209 Acute bronchitis, unspecified: Secondary | ICD-10-CM

## 2014-05-15 MED ORDER — ALBUTEROL SULFATE HFA 108 (90 BASE) MCG/ACT IN AERS
2.0000 | INHALATION_SPRAY | RESPIRATORY_TRACT | Status: DC | PRN
Start: 2014-05-15 — End: 2014-08-29

## 2014-05-15 MED ORDER — HYDROCOD POLST-CHLORPHEN POLST 10-8 MG/5ML PO LQCR: 5.0000 mL | Freq: Two times a day (BID) | ORAL | Status: DC | PRN

## 2014-05-15 MED ORDER — AMOXICILLIN-POT CLAVULANATE 875-125 MG PO TABS: 1.0000 | ORAL_TABLET | Freq: Two times a day (BID) | ORAL | Status: DC

## 2014-05-15 MED ORDER — PSEUDOEPHEDRINE-GUAIFENESIN ER 60-600 MG PO TB12: 1.0000 | ORAL_TABLET | Freq: Two times a day (BID) | ORAL | Status: DC

## 2014-05-15 NOTE — Patient Instructions (Signed)
Sleep Apnea  Sleep apnea is a sleep disorder characterized by abnormal pauses in breathing while you sleep. When your breathing pauses, the level of oxygen in your blood decreases. This causes you to move out of deep sleep and into light sleep. As a result, your quality of sleep is poor, and the system that carries your blood throughout your body (cardiovascular system) experiences stress. If sleep apnea remains untreated, the following conditions can develop:  High blood pressure (hypertension).  Coronary artery disease.  Inability to achieve or maintain an erection (impotence).  Impairment of your thought process (cognitive dysfunction). There are three types of sleep apnea: 1. Obstructive sleep apnea--Pauses in breathing during sleep because of a blocked airway. 2. Central sleep apnea--Pauses in breathing during sleep because the area of the brain that controls your breathing does not send the correct signals to the muscles that control breathing. 3. Mixed sleep apnea--A combination of both obstructive and central sleep apnea. RISK FACTORS The following risk factors can increase your risk of developing sleep apnea:  Being overweight.  Smoking.  Having narrow passages in your nose and throat.  Being of older age.  Being female.  Alcohol use.  Sedative and tranquilizer use.  Ethnicity. Among individuals younger than 35 years, African Americans are at increased risk of sleep apnea. SYMPTOMS   Difficulty staying asleep.  Daytime sleepiness and fatigue.  Loss of energy.  Irritability.  Loud, heavy snoring.  Morning headaches.  Trouble concentrating.  Forgetfulness.  Decreased interest in sex. DIAGNOSIS  In order to diagnose sleep apnea, your caregiver will perform a physical examination. Your caregiver may suggest that you take a home sleep test. Your caregiver may also recommend that you spend the night in a sleep lab. In the sleep lab, several monitors record  information about your heart, lungs, and brain while you sleep. Your leg and arm movements and blood oxygen level are also recorded. TREATMENT The following actions may help to resolve mild sleep apnea:  Sleeping on your side.   Using a decongestant if you have nasal congestion.   Avoiding the use of depressants, including alcohol, sedatives, and narcotics.   Losing weight and modifying your diet if you are overweight. There also are devices and treatments to help open your airway:  Oral appliances. These are custom-made mouthpieces that shift your lower jaw forward and slightly open your bite. This opens your airway.  Devices that create positive airway pressure. This positive pressure "splints" your airway open to help you breathe better during sleep. The following devices create positive airway pressure:  Continuous positive airway pressure (CPAP) device. The CPAP device creates a continuous level of air pressure with an air pump. The air is delivered to your airway through a mask while you sleep. This continuous pressure keeps your airway open.  Nasal expiratory positive airway pressure (EPAP) device. The EPAP device creates positive air pressure as you exhale. The device consists of single-use valves, which are inserted into each nostril and held in place by adhesive. The valves create very little resistance when you inhale but create much more resistance when you exhale. That increased resistance creates the positive airway pressure. This positive pressure while you exhale keeps your airway open, making it easier to breath when you inhale again.  Bilevel positive airway pressure (BPAP) device. The BPAP device is used mainly in patients with central sleep apnea. This device is similar to the CPAP device because it also uses an air pump to deliver continuous air pressure   through a mask. However, with the BPAP machine, the pressure is set at two different levels. The pressure when you  exhale is lower than the pressure when you inhale.  Surgery. Typically, surgery is only done if you cannot comply with less invasive treatments or if the less invasive treatments do not improve your condition. Surgery involves removing excess tissue in your airway to create a wider passage way. Document Released: 02/14/2002 Document Revised: 06/21/2012 Document Reviewed: 07/03/2011 ExitCare Patient Information 2015 [employer]. This information is not intended to replace advice given to you by your health care provider. Make sure you discuss any questions you have with your health care provider.  

## 2014-05-15 NOTE — Progress Notes (Signed)
[facility] [address], [place] [address] [phone]  Date:  05/15/2014   Name:  Alyssa Blanchard   DOB:  03-07-1971   MRN:  [id number]  PCP:  Nicki Reaper, NP    Chief Complaint: Cough and Facial Pain   History of Present Illness:  Alyssa Blanchard is a 44 y.o. very pleasant female patient who presents with the following:  Ill since mid last week with nasal congestion and drainage.  Purulent in nature Pressure on cheeks and forehead Sore throat with PND Cough and wheezing and mostly non productive No shortness of breath No fever or chills this week but did have last week No nausea or vomiting or stool change No rash Snores heavily.  Awakens fatigued and can nap at the drop of a hat. No improvement with over the counter medications or other home remedies.  Denies other complaint or health concern today.   Patient Active Problem List   Diagnosis Date Noted  . Preventative health care 07/30/2012  . Morbid obesity 07/30/2012    Past Medical History  Diagnosis Date  . Depression   . GERD (gastroesophageal reflux disease)   . Anxiety   . Asthma   . Migraines   . History of chicken pox   . STD (sexually transmitted disease)     chl hx & hsv 1&2    Past Surgical History  Procedure Laterality Date  . Tonsillectomy  1978    History  Substance Use Topics  . Smoking status: Former Games developer  . Smokeless tobacco: Never Used  . Alcohol Use: Yes     Comment: 1 a month    Family History  Problem Relation Age of Onset  . Diabetes Mother   . Hypertension Mother   . Hyperlipidemia Mother   . Kidney disease Mother   . Cancer Father     pancreatic  . Hypertension Maternal Grandmother   . Diabetes Maternal Grandmother   . Heart failure Maternal Grandmother     CHF  . Heart attack Maternal Grandfather   . Hypertension Maternal Grandfather   . Hypertension Paternal Grandmother   . Alzheimer's disease Paternal Grandmother   . Hypertension Paternal  Grandfather   . Cancer Maternal Uncle     melanoma  . Cancer Paternal Uncle     kidney    No Known Allergies  Medication list has been reviewed and updated.  Current Outpatient Prescriptions on File Prior to Visit  Medication Sig Dispense Refill  . albuterol (PROVENTIL HFA;VENTOLIN HFA) 108 (90 BASE) MCG/ACT inhaler Inhale 2 puffs into the lungs every 6 (six) hours as needed. 6.7 g 1  . anti-nausea (EMETROL) solution Take 10 mLs by mouth every 15 (fifteen) minutes as needed for nausea or vomiting.    . ARIPiprazole (ABILIFY) 15 MG tablet Take 15 mg by mouth daily.    Marland Kitchen buPROPion (WELLBUTRIN SR) 150 MG 12 hr tablet Take 300 mg by mouth daily.     . famotidine (PEPCID) 20 MG tablet Take 20 mg by mouth 2 (two) times daily.    Marland Kitchen LORazepam (ATIVAN) 0.5 MG tablet Take 0.5 mg by mouth daily.    . Lurasidone HCl (LATUDA) 60 MG TABS Take 1 tablet by mouth daily.    . meloxicam (MOBIC) 15 MG tablet TAKE 1 TABLET BY MOUTH EVERY DAY 30 tablet 2  . traZODone (DESYREL) 150 MG tablet Take 150 mg by mouth at bedtime.    . vitamin B-12 (CYANOCOBALAMIN) 500 MCG tablet  Take 500 mcg by mouth daily.     Current Facility-Administered Medications on File Prior to Visit  Medication Dose Route Frequency Provider Last Rate Last Dose  . triamcinolone acetonide (KENALOG) 10 MG/ML injection 10 mg  10 mg Other Once Alvan Dameichard Sikora, DPM        Review of Systems:  As per HPI, otherwise negative.    Physical Examination: Filed Vitals:   05/15/14 1522  BP: 132/84  Pulse: 87  Temp: 98.2 F (36.8 C)  Resp: 18   Filed Vitals:   05/15/14 1522  Height: 5\' 7"  (1.702 m)  Weight: 382 lb (173.274 kg)   Body mass index is 59.82 kg/(m^2). Ideal Body Weight: Weight in (lb) to have BMI = 25: 159.3  GEN: morbid obesity, NAD, Non-toxic, A & O x 3 HEENT: Atraumatic, Normocephalic. Neck supple. No masses, No LAD. Ears and Nose: No external deformity. CV: RRR, No M/G/R. No JVD. No thrill. No extra heart  sounds. PULM: CTA B, diffuse wheezes, no crackles, rhonchi. No retractions. No resp. distress. No accessory muscle use. ABD: S, NT, ND, +BS. No rebound. No HSM. EXTR: No c/c/e NEURO Normal gait.  PSYCH: Normally interactive. Conversant. Not depressed or anxious appearing.  Calm demeanor.    Assessment and Plan: Bronchitis bronchospasm Sinusitis tussionex Albuterol augmentin mucinex d OSA Sleep study  Signed,  Phillips OdorJeffery Anderson, MD

## 2014-05-19 ENCOUNTER — Telehealth: Payer: Self-pay

## 2014-05-19 MED ORDER — BENZONATATE 200 MG PO CAPS: 200.0000 mg | ORAL_CAPSULE | Freq: Two times a day (BID) | ORAL | Status: DC | PRN

## 2014-05-19 NOTE — Telephone Encounter (Signed)
Rx sent. Notified pt. 

## 2014-05-19 NOTE — Telephone Encounter (Signed)
Pt was seen by Carmelina DaneJeffery S Anderson, MD at 05/15/2014 4:04 PM for Bronchitis with bronchospasm and would like to know if she could get tesselon pearls rather than the chlorpheniramine-HYDROcodone [facility](TUSSIONEX PENNKINETIC ER) 10-8 MG/5ML Mare FerrariLQCR [[id number]; states her cough is not any better.

## 2014-05-19 NOTE — Telephone Encounter (Signed)
200

## 2014-05-19 NOTE — Telephone Encounter (Signed)
100 mg or 200mg ?

## 2014-05-19 NOTE — Telephone Encounter (Signed)
She needs to use her inhaler!!! Please call in the tessalon

## 2014-06-06 ENCOUNTER — Ambulatory Visit: Payer: BLUE CROSS/BLUE SHIELD

## 2014-06-14 ENCOUNTER — Ambulatory Visit (INDEPENDENT_AMBULATORY_CARE_PROVIDER_SITE_OTHER): Payer: BLUE CROSS/BLUE SHIELD | Admitting: Podiatry

## 2014-06-14 ENCOUNTER — Encounter: Payer: Self-pay | Admitting: Podiatry

## 2014-06-14 VITALS — BP 161/87 | HR 74 | Resp 15

## 2014-06-14 DIAGNOSIS — M722 Plantar fascial fibromatosis: Secondary | ICD-10-CM | POA: Diagnosis not present

## 2014-06-14 MED ORDER — TRIAMCINOLONE ACETONIDE 10 MG/ML IJ SUSP
10.0000 mg | Freq: Once | INTRAMUSCULAR | Status: AC
  Administered 2014-06-14: 10 mg

## 2014-06-14 NOTE — Patient Instructions (Signed)

## 2014-06-15 NOTE — Progress Notes (Signed)
Subjective:     Patient ID: Alyssa Blanchard, female   DOB: Aug 24, 1970, 44 y.o.   MRN: [id number]  HPI Asian states that both of my heels are really bothering me and I need occasional injections to keep him under control   Review of Systems     Objective:   Physical Exam Neurovascular status found to be intact with pain in the plantar heels of both feet that are very sore when pressed and makes shoe gear and walking difficult area obesity complicating factor    Assessment:     Chronic plantar fasciitis of both feet    Plan:     Continue orthotics physical therapy and stretching and injected the plantar fascia bilateral 3 mg Kenalog 5 mg Xylocaine and reappoint to recheck

## 2014-07-10 ENCOUNTER — Ambulatory Visit (INDEPENDENT_AMBULATORY_CARE_PROVIDER_SITE_OTHER): Payer: BLUE CROSS/BLUE SHIELD | Admitting: Neurology

## 2014-07-10 ENCOUNTER — Encounter: Payer: Self-pay | Admitting: Neurology

## 2014-07-10 DIAGNOSIS — E662 Morbid (severe) obesity with alveolar hypoventilation: Secondary | ICD-10-CM

## 2014-07-10 DIAGNOSIS — R51 Headache: Secondary | ICD-10-CM

## 2014-07-10 DIAGNOSIS — R351 Nocturia: Secondary | ICD-10-CM | POA: Diagnosis not present

## 2014-07-10 DIAGNOSIS — R519 Headache, unspecified: Secondary | ICD-10-CM | POA: Insufficient documentation

## 2014-07-10 DIAGNOSIS — R0683 Snoring: Secondary | ICD-10-CM | POA: Diagnosis not present

## 2014-07-10 NOTE — Progress Notes (Signed)
SLEEP MEDICINE CLINIC   Provider:  Melvyn Novas, M D  Referring Provider: Lorre Munroe, NP Primary Care Physician:  Nicki Reaper, NP  Chief Complaint  Patient presents with  . Sleep Apnea    rm 10, alone    HPI:  Alyssa Blanchard is a 44 y.o. female seen here as a referral  from NP   Baity , Meredith Staggers, MD. [facility] urgent care.  Alyssa Blanchard reports that she has not had a sound sleep in a while. She is orthopneic and has to sleep with an elevated body her chest as to be elevated and therefore she prefers to sleep in a recliner. Her sleep habits are as follows she usually goes to bed around 10:50 PM and she will promptly fall asleep she reports. She wakes up about every 2 hours at 2, 4 and 6 AM. Sometimes it takes her so long to get back to sleep that she will pass just resting or dosing between true arousals. She reports that some of her arousals and awakenings are due to and jolted feeling like a sleep myoclonus and some are due to nocturia. Sometimes she will go to the bathroom more than 3 times at night. She also has a tendency to have nasal allergies and drainage. This makes her a mouth breather especially during allergy season. In March she developed severe nasal congestion and drainage was pass. She also headaches associated with it a pressure on her cheeks and 4 had the sore throat she was coughing and wheezing. He has overcome this by now but she still has some drainage. She also reports that she wakes up with headaches and these of migrainous nature. She usually wakes up with them my gr not with another kind of headache. Her migraines do not get better as the day goes on to the may not be simply sleep related. She has been fighting with morbid obesity since childhood. She was heavy all her life. An exacerbation took place when she was diagnosed with bipolar disorder and the medication caused weight gain about 5 years ago.  She craves always more sleep, getting about 6 hours at  night, not feeling refreshed in the morning. All day long she feels tired. She drinks a lot of caffeine. She doesn't drink alcohol.   Her father snored,-he had apnea, was hypersomnic. He died at 44 years of age , due to pancreatic cancer.  Her mother has very fragmented sleep , the patients lives with her. Both sleep in recliners. The patient has bronchitis with wheezing and uses inhalers.     Review of Systems: Out of a complete 14 system review, the patient complains of only the following symptoms, and all other reviewed systems are negative. Cough, sinus , wheezing. Sleep problems. Depression in the past. Tremor, pain in shoulder, chest,   Epworth score 8 , Fatigue severity score 63   , depression score : 'i feel not depressed"    History   Social History  . Marital Status: Single    Spouse Name: N/A  . Number of Children: N/A  . Years of Education: 12   Occupational History  . [employer]   Social History Main Topics  . Smoking status: Former Smoker    Quit date: 03/10/2013  . Smokeless tobacco: Never Used  . Alcohol Use: 0.0 oz/week    0 Standard drinks or equivalent per week     Comment: 1 a month  . Drug Use: No  .  Sexual Activity:    Partners: Male    Birth Control/ Protection: Condom   Other Topics Concern  . Not on file   Social History Narrative   Regular exercise-no   Caffeine Use-yes, 1-2 cups of caffeine daily    Family History  Problem Relation Age of Onset  . Diabetes Mother   . Hypertension Mother   . Hyperlipidemia Mother   . Kidney disease Mother   . Cancer Father     pancreatic  . Hypertension Maternal Grandmother   . Diabetes Maternal Grandmother   . Heart failure Maternal Grandmother     CHF  . Heart attack Maternal Grandfather   . Hypertension Maternal Grandfather   . Hypertension Paternal Grandmother   . Alzheimer's disease Paternal Grandmother   . Hypertension Paternal Grandfather   . Cancer Maternal Uncle      melanoma  . Cancer Paternal Uncle     kidney    Past Medical History  Diagnosis Date  . Depression   . GERD (gastroesophageal reflux disease)   . Anxiety   . Asthma   . Migraines   . History of chicken pox   . STD (sexually transmitted disease)     chl hx & hsv 1&2    Past Surgical History  Procedure Laterality Date  . Tonsillectomy  1978    Current Outpatient Prescriptions  Medication Sig Dispense Refill  . albuterol (PROVENTIL HFA;VENTOLIN HFA) 108 (90 BASE) MCG/ACT inhaler Inhale 2 puffs into the lungs every 6 (six) hours as needed. 6.7 g 1  . albuterol (PROVENTIL HFA;VENTOLIN HFA) 108 (90 BASE) MCG/ACT inhaler Inhale 2 puffs into the lungs every 4 (four) hours as needed for wheezing or shortness of breath (cough, shortness of breath or wheezing.). 1 Inhaler 1  . ARIPiprazole (ABILIFY) 15 MG tablet Take 15 mg by mouth daily.    Marland Kitchen. buPROPion (WELLBUTRIN SR) 150 MG 12 hr tablet Take 300 mg by mouth daily.     . famotidine (PEPCID) 20 MG tablet Take 20 mg by mouth 2 (two) times daily.    Marland Kitchen. LORazepam (ATIVAN) 0.5 MG tablet Take 0.5 mg by mouth daily.    . Lurasidone HCl (LATUDA) 60 MG TABS Take 1 tablet by mouth daily.    . meloxicam (MOBIC) 15 MG tablet TAKE 1 TABLET BY MOUTH EVERY DAY 30 tablet 2  . traZODone (DESYREL) 150 MG tablet Take 150 mg by mouth at bedtime.    . vitamin B-12 (CYANOCOBALAMIN) 500 MCG tablet Take 500 mcg by mouth daily.     Current Facility-Administered Medications  Medication Dose Route Frequency Provider Last Rate Last Dose  . triamcinolone acetonide (KENALOG) 10 MG/ML injection 10 mg  10 mg Other Once Alvan Dameichard Sikora, DPM        Allergies as of 07/10/2014  . (No Known Allergies)    Vitals: BP 130/80 mmHg  Pulse 96  Resp 18  Ht 5' 7.32" (1.71 m)  Wt 382 lb (173.274 kg)  BMI 59.26 kg/m2 Last Weight:  Wt Readings from Last 1 Encounters:  07/10/14 382 lb (173.274 kg)       Last Height:   Ht Readings from Last 1 Encounters:  07/10/14 5'  7.32" (1.71 m)    Physical exam:  General: The patient is awake, alert and appears not in acute distress. The patient is well groomed. Head: Normocephalic, atraumatic. Neck is supple. Mallampati 4 , narrow, lower airway. ,  neck circumference: 18 . Nasal airflow restricted, congested. , TMJ not evident .  Retrognathia is not seen.  Cardiovascular:  Regular rate and rhythm , without  murmurs or carotid bruit, and without distended neck veins. Respiratory: Lungs are clear to auscultation. Skin:  Without evidence of edema, or rash Trunk: BMI is significantly elevated and patient  has normal posture.  Neurologic exam : The patient is awake and alert, oriented to place and time.   Memory subjective described as intact. There is a normal attention span & concentration ability. Speech is fluent without dysarthria, but dysphonia. Cranial nerves: Pupils are equal and briskly reactive to light. Funduscopic exam without  evidence of pallor or edema. Extraocular movements  in vertical and horizontal planes intact and without nystagmus. Visual fields by finger perimetry are intact. Hearing to finger rub intact.  Facial sensation intact to fine touch. Facial motor strength is symmetric and tongue and uvula move midline.  Motor exam:   Normal tone ,muscle bulk and symmetric strength in all extremities.  Sensory:  Fine touch, pinprick and vibration were tested in all extremities. Proprioception is  normal.  Coordination: Rapid alternating movements in the fingers/hands is normal. Finger-to-nose maneuver normal without evidence of ataxia, dysmetria, but with mild  tremor.  Gait and station: Patient walks without assistive device and is able unassisted to climb up to the exam table.  Strength within normal limits. Stance is stable and normal. Tandem gait is deferred. Deep tendon reflexes: in the  upper and lower extremities are symmetric and intact.  Attenuated by obesity Babinski maneuver response is  downgoing.   Assessment:  After physical and neurologic examination, review of laboratory studies, imaging, neurophysiology testing and pre-existing records, assessment is   Mrs. Peet has significant risk factors for obstructive sleep apnea but also for obesity hypoventilation syndrome. Clinically she feels never refreshed she often wakes up with headaches she has nocturia and she sleeps in a recliner. She has been known to snore and she has been witnessed to have apneas. She also has a family history of undiagnosed apnea but by clinical description she is fairly sure her father had this disorder. I do think that the high degree of fatigue could be related to CO2 retention and the inability of her body to exchange this for oxygen. I explained to her that this widens blood vessels in the brain and can cause significant headaches. Especially when patients tend to wake up with headaches is there a strong correlation to CO2 retention seen. 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 The patient has no sign of parkinsonism and no resting tremor. She is well groomed and cooperative and pleasant. Given that she is usually promptly falling asleep I think it will be easy to monitor her in the sleep lab. I cannot perform a home sleep test on a patient with obesity hypoventilation suspicion as hypercapnia studies cannot be done ambulatory. OSA , OHS, bipolar depression on multiple tremor causing medications.     The patient was advised of the nature of the diagnosed sleep disorder , the treatment options and risks for general a health and wellness arising from not treating the condition. Visit duration was 45 minutes.   Plan:  Treatment plan and additional workup : I will order an attended sleep study I have discussed and explained to the patient that the hypercapnia testing is essential especially because of the comorbidities of nocturia or  morning headaches and because of her body mass index of course. The patient has been  able to lose a little bit of weight and a think it is quite difficult for her to lose weight that she gained after medication. She would consider weight loss surgery. I would recommend a low carb diet. Regular exercise at least 20 minutes of walking in the morning and evening. The sleep study will be ordered at [facility] sleep with CO2 measures split at an AHI of 15 score at 3%. If the patient shows very little apnea but retention of CO2 I would consider starting her on a liter of oxygen to see if her sleep becomes less fragmented.  I thank Dr. Chilton Si for this interesting consult, I would keep him posted about the results of the sleep study,  sincerely   Melvyn Novas MD  07/10/2014

## 2014-07-10 NOTE — Patient Instructions (Signed)
Polysomnography (Sleep Studies) Polysomnography (PSG) is a series of tests used for detecting (diagnosing) obstructive sleep apnea and other sleep disorders. The tests measure how some parts of your body are working while you are sleeping. The tests are extensive and expensive. They are done in a sleep lab or hospital, and vary from center to center. Your caregiver may perform other more simple sleep studies and questionnaires before doing more complete and involved testing. Testing may not be covered by insurance. Some of these tests are:  An EEG (Electroencephalogram). This tests your brain waves and stages of sleep.  An EOG (Electrooculogram). This measures the movements of your eyes. It detects periods of REM (rapid eye movement) sleep, which is your dream sleep.  An EKG (Electrocardiogram). This measures your heart rhythm.  EMG (Electromyography). This is a measurement of how the muscles are working in your upper airway and your legs while sleeping.  An oximetry measurement. It measures how much oxygen (air) you are getting while sleeping.  Breathing efforts may be measured. The same test can be interpreted (understood) differently by different caregivers and centers that study sleep.  Studies may be given an apnea/hypopnea index (AHI). This is a number which is found by counting the times of no breathing or under breathing during the night, and relating those numbers to the amount of time spent in bed. When the AHI is greater than 15, the patient is likely to complain of daytime sleepiness. When the AHI is greater than 30, the patient is at increased risk for heart problems and must be followed more closely. Following the AHI also allows you to know how treatment is working. Simple oximetry (tracking the amount of oxygen that is taken in) can be used for screening patients who:  Do not have symptoms (problems) of OSA.  Have a normal Epworth Sleepiness Scale Score.  Have a low pre-test  probability of having OSA.  Have none of the upper airway problems likely to cause apnea.  Oximetry is also used to determine if treatment is effective in patients who showed significant desaturations (not getting enough oxygen) on their home sleep study. One extra measure of safety is to perform additional studies for the person who only snores. This is because no one can predict with absolute certainty who will have OSA. Those who show significant desaturations (not getting enough oxygen) are recommended to have a more detailed sleep study. Document Released: 08/31/2002 Document Revised: 05/19/2011 Document Reviewed: 05/02/2013 ExitCare Patient Information 2015 [employer]. This information is not intended to replace advice given to you by your health care provider. Make sure you discuss any questions you have with your health care provider.  

## 2014-07-18 ENCOUNTER — Ambulatory Visit (INDEPENDENT_AMBULATORY_CARE_PROVIDER_SITE_OTHER): Payer: BLUE CROSS/BLUE SHIELD | Admitting: Neurology

## 2014-07-18 DIAGNOSIS — G478 Other sleep disorders: Secondary | ICD-10-CM

## 2014-07-18 DIAGNOSIS — R519 Headache, unspecified: Secondary | ICD-10-CM

## 2014-07-18 DIAGNOSIS — R351 Nocturia: Secondary | ICD-10-CM

## 2014-07-18 DIAGNOSIS — E662 Morbid (severe) obesity with alveolar hypoventilation: Secondary | ICD-10-CM

## 2014-07-18 DIAGNOSIS — R0683 Snoring: Secondary | ICD-10-CM

## 2014-07-18 DIAGNOSIS — R51 Headache: Secondary | ICD-10-CM

## 2014-07-18 NOTE — Sleep Study (Signed)
Please see the scanned sleep study interpretation located in the Procedure tab within the Chart Review section. 

## 2014-07-27 ENCOUNTER — Telehealth: Payer: Self-pay | Admitting: Neurology

## 2014-07-27 NOTE — Telephone Encounter (Signed)
Returned pt's call. Informed pt that I have not received her results yet but as soon as I did that I would call her. Explained to pt that it sometimes takes two weeks for the studies to be read. Pt verbalized understanding.

## 2014-07-27 NOTE — Telephone Encounter (Signed)
Pt called and requested to speak with someone regarding the results of her sleep study on (07/18/14).

## 2014-08-04 ENCOUNTER — Telehealth: Payer: Self-pay

## 2014-08-04 NOTE — Telephone Encounter (Signed)
Called pt to relay her sleep study results to her. 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 Advised pt to come in for an appt because there is pharmacological therapy for the PLMS and possibly RLS. Pt verbalized understanding and an appt was made for her to follow up 08/31/14. She was advised to arrive 15 minutes prior to appt.

## 2014-08-25 ENCOUNTER — Other Ambulatory Visit: Payer: Self-pay | Admitting: Surgery

## 2014-08-25 ENCOUNTER — Other Ambulatory Visit: Payer: Self-pay

## 2014-08-25 DIAGNOSIS — E669 Obesity, unspecified: Secondary | ICD-10-CM

## 2014-08-25 DIAGNOSIS — Z1231 Encounter for screening mammogram for malignant neoplasm of breast: Secondary | ICD-10-CM

## 2014-08-29 ENCOUNTER — Ambulatory Visit (INDEPENDENT_AMBULATORY_CARE_PROVIDER_SITE_OTHER): Payer: BLUE CROSS/BLUE SHIELD | Admitting: Internal Medicine

## 2014-08-29 VITALS — BP 146/87 | HR 84 | Temp 97.7°F | Resp 18 | Wt 384.0 lb

## 2014-08-29 DIAGNOSIS — R350 Frequency of micturition: Secondary | ICD-10-CM | POA: Diagnosis not present

## 2014-08-29 DIAGNOSIS — F0630 Mood disorder due to known physiological condition, unspecified: Secondary | ICD-10-CM

## 2014-08-29 DIAGNOSIS — H5319 Other subjective visual disturbances: Secondary | ICD-10-CM

## 2014-08-29 DIAGNOSIS — G43109 Migraine with aura, not intractable, without status migrainosus: Secondary | ICD-10-CM | POA: Diagnosis not present

## 2014-08-29 DIAGNOSIS — H53149 Visual discomfort, unspecified: Secondary | ICD-10-CM

## 2014-08-29 LAB — POCT URINALYSIS DIPSTICK
Bilirubin, UA: NEGATIVE
Glucose, UA: NEGATIVE
Ketones, UA: NEGATIVE
Leukocytes, UA: NEGATIVE
Nitrite, UA: NEGATIVE
Protein, UA: NEGATIVE
Spec Grav, UA: 1.02
Urobilinogen, UA: 0.2
pH, UA: 6.5

## 2014-08-29 LAB — POCT UA - MICROSCOPIC ONLY
Casts, Ur, LPF, POC: NEGATIVE
Crystals, Ur, HPF, POC: NEGATIVE
Mucus, UA: POSITIVE
Yeast, UA: NEGATIVE

## 2014-08-29 MED ORDER — KETOPROFEN 50 MG PO CAPS: 50.0000 mg | ORAL_CAPSULE | Freq: Four times a day (QID) | ORAL | Status: DC | PRN

## 2014-08-29 MED ORDER — SUMATRIPTAN SUCCINATE 100 MG PO TABS: ORAL_TABLET | ORAL | Status: DC

## 2014-08-29 MED ORDER — KETOROLAC TROMETHAMINE 60 MG/2ML IM SOLN
60.0000 mg | Freq: Once | INTRAMUSCULAR | Status: AC
  Administered 2014-08-29: 60 mg via INTRAMUSCULAR

## 2014-08-29 NOTE — Progress Notes (Signed)
Subjective:  This chart was scribed for Alyssa Sia, MD by Stann Ore, Medical Scribe. This patient was seen in Room 8 and the patient's care was started at 2:58 PM.     Patient ID: Alyssa Blanchard, female    DOB: 03/07/1971, 44 y.o.   MRN: [id number]  HPI Alyssa Blanchard is a 44 y.o. female who presents to [facility] complaining of migraines around the sides of her head starting yesterday.  She has had similar symptoms in the past. She states that she wakes up with these migraines without any warning. She describes that she is sensitive to light and feels nauseated. She has taken pills to make sure she doesn't throw up from nausea. She had imitrex prescribed but currently not. She notes having some increased urinary frequency, and nocturia 3 times. She denies fever, sore throat, cough, dysuria, leg swelling, SOB and abdominal pain.  Her last UTI was a year ago.   She is currently seeing a neurologist for something else= evaluation for sleep apnea secondary to obesity.  Her Ativan dosage went up-also Wellbutrin.   Patient Active Problem List   Diagnosis Date Noted  . Migraine with aura and without status migrainosus, not intractable 08/29/2014  . Hypoventilation associated with obesity syndrome 07/10/2014  . Snorings 07/10/2014  . Sleep related headaches 07/10/2014  . Nocturia more than twice per night 07/10/2014  . Preventative health care 07/30/2012  . Morbid obesity 07/30/2012    -  mood disorder  Current Outpatient Prescriptions on File Prior to Visit  Medication Sig Dispense Refill  . albuterol (PROVENTIL HFA;VENTOLIN HFA) 108 (90 BASE) MCG/ACT inhaler Inhale 2 puffs into the lungs every 6 (six) hours as needed. 6.7 g 1  . ARIPiprazole (ABILIFY) 15 MG tablet Take 15 mg by mouth daily.    Marland Kitchen buPROPion (WELLBUTRIN SR) 150 MG 12 hr tablet Take 300 mg by mouth daily.     . famotidine (PEPCID) 20 MG tablet Take 20 mg by mouth 2 (two) times daily.    Marland Kitchen LORazepam (ATIVAN) 0.5 MG tablet Take  0.5 mg by mouth daily.    . Lurasidone HCl (LATUDA) 60 MG TABS Take 1 tablet by mouth daily.    . meloxicam (MOBIC) 15 MG tablet TAKE 1 TABLET BY MOUTH EVERY DAY 30 tablet 2  . traZODone (DESYREL) 150 MG tablet Take 150 mg by mouth at bedtime.     Current Facility-Administered Medications on File Prior to Visit  Medication Dose Route Frequency Provider Last Rate Last Dose  . triamcinolone acetonide (KENALOG) 10 MG/ML injection 10 mg  10 mg Other Once Alvan Dame, DPM          Review of Systems  Constitutional: Negative for fever.  HENT: Negative for sore throat.   Respiratory: Negative for cough and shortness of breath.   Cardiovascular: Negative for leg swelling.  Gastrointestinal: Negative for abdominal pain.  Genitourinary: Positive for frequency. Negative for dysuria.  Neurological: Positive for headaches.       Objective:   Physical Exam  Constitutional: She is oriented to person, place, and time. She appears well-developed and well-nourished. No distress.  Morbid obese  HENT:  Head: Normocephalic and atraumatic.  Mouth/Throat: Oropharynx is clear and moist.  Eyes: Conjunctivae and EOM are normal. Pupils are equal, round, and reactive to light.  Photophobic  Neck: Normal range of motion. Neck supple.  Cardiovascular: Normal rate, regular rhythm and normal heart sounds.   Pulmonary/Chest: Effort normal and breath sounds normal. No respiratory distress.  Musculoskeletal: Normal range of motion. She exhibits no edema.  Neurological: She is alert and oriented to person, place, and time. She has normal reflexes. No cranial nerve deficit. She exhibits normal muscle tone. Coordination normal.  Skin: Skin is warm and dry.  Psychiatric: She has a normal mood and affect. Her behavior is normal. Judgment and thought content normal.  Nursing note and vitals reviewed.   BP 146/87 mmHg  Pulse 84  Temp(Src) 97.7 F (36.5 C) (Oral)  Resp 18  Wt 384 lb (174.181 kg)  SpO2 95%   LMP 08/29/2014  Results for orders placed or performed in visit on 08/29/14  POCT urinalysis dipstick  Result Value Ref Range   Color, UA yellow    Clarity, UA clear    Glucose, UA neg    Bilirubin, UA neg    Ketones, UA neg    Spec Grav, UA 1.020    Blood, UA small    pH, UA 6.5    Protein, UA neg    Urobilinogen, UA 0.2    Nitrite, UA neg    Leukocytes, UA Negative Negative  POCT UA - Microscopic Only  Result Value Ref Range   WBC, Ur, HPF, POC 0-2    RBC, urine, microscopic 0-1    Bacteria, U Microscopic trace    Mucus, UA positive    Epithelial cells, urine per micros 0-4    Crystals, Ur, HPF, POC neg    Casts, Ur, LPF, POC neg    Yeast, UA neg         Assessment & Plan:   I have completed the patient encounter in its entirety as documented by the scribe, with editing by me where necessary. Amaryah Mallen P. Merla Riches, M.D.  Migraine with aura and without status migrainosus, not intractable - Plan: ketorolac (TORADOL) injection 60 mg  Frequency of urination - follow for now since UA wnl  Photophobia-secondary  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 classified elsewhere--- note medication list//recent medication changes to address anxiety but this should not be a precipitator of headaches  Meds ordered this encounter  Medications  . ketorolac (TORADOL) injection 60 mg  . SUMAtriptan (IMITREX) 100 MG tablet-----to be restarted at the onset of the headaches as this work in the past     Sig: ! Tab at onset of HA. May repeat in 2 hours if headache persists or recurs.No more than 4 doses a month.    Dispense:  10 tablet    Refill:  0  . ketoprofen (ORUDIS) 50 MG capsule-----to use if her current injection is unhelpful follow-up if no response in 24-48 hours ///    Sig: Take 1 capsule (50 mg total) by mouth every 6 (six) hours as needed. For headache    Dispense:  30 capsule    Refill:  0   Discuss headaches with her primary care or neurologist in follow-up

## 2014-08-31 ENCOUNTER — Ambulatory Visit (INDEPENDENT_AMBULATORY_CARE_PROVIDER_SITE_OTHER): Payer: BLUE CROSS/BLUE SHIELD | Admitting: Neurology

## 2014-08-31 ENCOUNTER — Encounter: Payer: Self-pay | Admitting: Neurology

## 2014-08-31 VITALS — BP 130/82 | HR 86 | Resp 20 | Ht 67.32 in | Wt 384.0 lb

## 2014-08-31 DIAGNOSIS — G2581 Restless legs syndrome: Secondary | ICD-10-CM

## 2014-08-31 DIAGNOSIS — G4761 Periodic limb movement disorder: Secondary | ICD-10-CM

## 2014-08-31 DIAGNOSIS — G43001 Migraine without aura, not intractable, with status migrainosus: Secondary | ICD-10-CM | POA: Diagnosis not present

## 2014-08-31 MED ORDER — TOPIRAMATE 25 MG PO TABS
25.0000 mg | ORAL_TABLET | Freq: Two times a day (BID) | ORAL | Status: DC
Start: 2014-08-31 — End: 2016-08-22

## 2014-08-31 NOTE — Patient Instructions (Signed)
Topiramate tablets What is this medicine? TOPIRAMATE (toe PYRE a mate) is used to treat seizures in adults or children with epilepsy. It is also used for the prevention of migraine headaches. This medicine may be used for other purposes; ask your health care provider or pharmacist if you have questions. COMMON BRAND NAME(S): Topamax, Topiragen What should I tell my health care provider before I take this medicine? They need to know if you have any of these conditions: -bleeding disorders -cirrhosis of the liver or liver disease -diarrhea -glaucoma -kidney stones or kidney disease -low blood counts, like low white cell, platelet, or red cell counts -lung disease like asthma, obstructive pulmonary disease, emphysema -metabolic acidosis -on a ketogenic diet -schedule for surgery or a procedure -suicidal thoughts, plans, or attempt; a previous suicide attempt by you or a family member -an unusual or allergic reaction to topiramate, other medicines, foods, dyes, or preservatives -pregnant or trying to get pregnant -breast-feeding How should I use this medicine? Take this medicine by mouth with a glass of water. Follow the directions on the prescription label. Do not crush or chew. You may take this medicine with meals. Take your medicine at regular intervals. Do not take it more often than directed. Talk to your pediatrician regarding the use of this medicine in children. Special care may be needed. While this drug may be prescribed for children as young as 2 years of age for selected conditions, precautions do apply. Overdosage: If you think you have taken too much of this medicine contact a poison control center or emergency room at once. NOTE: This medicine is only for you. Do not share this medicine with others. What if I miss a dose? If you miss a dose, take it as soon as you can. If your next dose is to be taken in less than 6 hours, then do not take the missed dose. Take the next dose at  your regular time. Do not take double or extra doses. What may interact with this medicine? Do not take this medicine with any of the following medications: -probenecid This medicine may also interact with the following medications: -acetazolamide -alcohol -amitriptyline -aspirin and aspirin-like medicines -birth control pills -certain medicines for depression -certain medicines for seizures -certain medicines that treat or prevent blood clots like warfarin, enoxaparin, dalteparin, apixaban, dabigatran, and rivaroxaban -digoxin -hydrochlorothiazide -lithium -medicines for pain, sleep, or muscle relaxation -metformin -methazolamide -NSAIDS, medicines for pain and inflammation, like ibuprofen or naproxen -pioglitazone -risperidone This list may not describe all possible interactions. Give your health care provider a list of all the medicines, herbs, non-prescription drugs, or dietary supplements you use. Also tell them if you smoke, drink alcohol, or use illegal drugs. Some items may interact with your medicine. What should I watch for while using this medicine? Visit your doctor or health care professional for regular checks on your progress. Do not stop taking this medicine suddenly. This increases the risk of seizures if you are using this medicine to control epilepsy. Wear a medical identification bracelet or chain to say you have epilepsy or seizures, and carry a card that lists all your medicines. This medicine can decrease sweating and increase your body temperature. Watch for signs of deceased sweating or fever, especially in children. Avoid extreme heat, hot baths, and saunas. Be careful about exercising, especially in hot weather. Contact your health care provider right away if you notice a fever or decrease in sweating. You should drink plenty of fluids while taking this medicine.   If you have had kidney stones in the past, this will help to reduce your chances of forming kidney  stones. If you have stomach pain, with nausea or vomiting and yellowing of your eyes or skin, call your doctor immediately. You may get drowsy, dizzy, or have blurred vision. Do not drive, use machinery, or do anything that needs mental alertness until you know how this medicine affects you. To reduce dizziness, do not sit or stand up quickly, especially if you are an older patient. Alcohol can increase drowsiness and dizziness. Avoid alcoholic drinks. If you notice blurred vision, eye pain, or other eye problems, seek medical attention at once for an eye exam. The use of this medicine may increase the chance of suicidal thoughts or actions. Pay special attention to how you are responding while on this medicine. Any worsening of mood, or thoughts of suicide or dying should be reported to your health care professional right away. This medicine may increase the chance of developing metabolic acidosis. If left untreated, this can cause kidney stones, bone disease, or slowed growth in children. Symptoms include breathing fast, fatigue, loss of appetite, irregular heartbeat, or loss of consciousness. Call your doctor immediately if you experience any of these side effects. Also, tell your doctor about any surgery you plan on having while taking this medicine since this may increase your risk for metabolic acidosis. Birth control pills may not work properly while you are taking this medicine. Talk to your doctor about using an extra method of birth control. Women who become pregnant while using this medicine may enroll in the [place] Antiepileptic Drug Pregnancy Registry by calling 1-888-233-2334. This registry collects information about the safety of antiepileptic drug use during pregnancy. What side effects may I notice from receiving this medicine? Side effects that you should report to your doctor or health care professional as soon as possible: -allergic reactions like skin rash, itching or hives,  swelling of the face, lips, or tongue -decreased sweating and/or rise in body temperature -depression -difficulty breathing, fast or irregular breathing patterns -difficulty speaking -difficulty walking or controlling muscle movements -hearing impairment -redness, blistering, peeling or loosening of the skin, including inside the mouth -tingling, pain or numbness in the hands or feet -unusual bleeding or bruising -unusually weak or tired -worsening of mood, thoughts or actions of suicide or dying Side effects that usually do not require medical attention (report to your doctor or health care professional if they continue or are bothersome): -altered taste -back pain, joint or muscle aches and pains -diarrhea, or constipation -headache -loss of appetite -nausea -stomach upset, indigestion -tremors This list may not describe all possible side effects. Call your doctor for medical advice about side effects. You may report side effects to FDA at 1-800-FDA-1088. Where should I keep my medicine? Keep out of the reach of children. Store at room temperature between 15 and 30 degrees C (59 and 86 degrees F) in a tightly closed container. Protect from moisture. Throw away any unused medicine after the expiration date. NOTE: This sheet is a summary. It may not cover all possible information. If you have questions about this medicine, talk to your doctor, pharmacist, or health care provider.  2015, [employer]. (2013-02-28 23:17:57)  

## 2014-08-31 NOTE — Progress Notes (Signed)
SLEEP MEDICINE CLINIC   Provider:  Melvyn Novas, M D  Referring Provider: Lorre Munroe, NP Primary Care Physician:  Nicki Reaper, NP  Chief Complaint  Patient presents with  . Follow-up    sleep study results, rm 10, alone    HPI:  Alyssa Blanchard is a 44 y.o. female seen here as a referral  from NP   Baity , Meredith Staggers, MD. [facility] drive urgent care.  Mrs. Alyssa Blanchard Section reports that she has not  Been able to sleep soundly in a while. She is orthopneic and has to sleep with an elevated body -therefore she prefers to sleep in a recliner. Her sleep habits are as follows she usually goes to bed around 10:50 PM and she will promptly fall asleep she reports. She wakes up about every 2 hours at 2, 4 and 6 AM. Sometimes it takes her so long to get back to sleep that she will pass just resting or dosing between true arousals. She reports that some of her arousals and awakenings are due to and jolted feeling like a sleep myoclonus and some are due to nocturia. Sometimes she will go to the bathroom more than 3 times at night. She also has a tendency to have nasal allergies and drainage. This makes her a mouth breather especially during allergy season. In March she developed severe nasal congestion and drainage was pass. She also headaches associated with it a pressure on her cheeks and 4 had the sore throat she was coughing and wheezing. He has overcome this by now but she still has some drainage. She also reports that she wakes up with headaches and these of migrainous nature. She usually wakes up with them my gr not with another kind of headache. Her migraines do not get better as the day goes on to the may not be simply sleep related. She has been fighting with morbid obesity since childhood. She was heavy all her life. An exacerbation took place when she was diagnosed with bipolar disorder and the medication caused weight gain about 5 years ago.  She craves always more sleep, getting about 6 hours at  night, not feeling refreshed in the morning. All day long she feels tired. She drinks a lot of caffeine. She doesn't drink alcohol.  Her father snored,-he had apnea, was daytime sleepy- hypersomnic. He died at 44 years of age , due to pancreatic cancer.  Her mother has very fragmented sleep , the patient lives with her. Both women sleep in recliners. The patient has bronchitis with wheezing and uses inhalers.   Interval history from 08-31-14,  After our last visit Mrs. Parmer underwent a sleep study on 07-18-14. She had endorsed the Epworth sleepiness score at 18 out of 24 points and the pH Q9 form for depression at 15 points the patient had an unusual results for her sleep study. I have strongly expected a high apnea index but her AHI was 1.9 and her RDI was 3.3. 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 She slept in her bed with an elevated chest. She slept supine. She did not retain CO2. The patient had 78 periodic limb movements at night and her arousal index from kicking or jerking was 11.3. The polysomnogram did not show tachybradycardia arrhythmia, oxygen desaturations CO2 retention or evidence of central or obstructive sleep apnea. The only findings were significant periodic limb movement related arousals.  She does report that she has restless legs that she has the irresistible urge to move her legs at night or at rest and that she sometimes has to massage or rubber latex 2. She does not drink caffeine at it beverages or obstructed, she is on some medications that may actually foster periodic limb movements namely Abilify, Wellbutrin, and dizzy rectal what I will do today is to obtain a serum ferritin level and evaluate her for iron deficiency. She has neither neuropathy,  No history of back pain or spinal stenosis. CD        Review of Systems: Out of a complete 14 system review, the patient complains of only  the following symptoms, and all other reviewed systems are negative. . Sleep problems. Depression in the past. Tremor, pain in shoulder, chest, cannot sleep on her side.   Epworth score 10-8  , Fatigue severity score 63   , depression score : 'i feel not depressed"    History   Social History  . Marital Status: Single    Spouse Name: N/A  . Number of Children: N/A  . Years of Education: 12   Occupational History  . [employer]   Social History Main Topics  . Smoking status: Former Smoker    Quit date: 03/10/2013  . Smokeless tobacco: Never Used  . Alcohol Use: 0.0 oz/week    0 Standard drinks or equivalent per week     Comment: 1 a month  . Drug Use: No  . Sexual Activity:    Partners: Male    Birth Control/ Protection: Condom   Other Topics Concern  . Not on file   Social History Narrative   Regular exercise-no   Caffeine Use-yes, 1-2 cups of caffeine daily    Family History  Problem Relation Age of Onset  . Diabetes Mother   . Hypertension Mother   . Hyperlipidemia Mother   . Kidney disease Mother   . Cancer Father     pancreatic  . Hypertension Maternal Grandmother   . Diabetes Maternal Grandmother   . Heart failure Maternal Grandmother     CHF  . Heart attack Maternal Grandfather   . Hypertension Maternal Grandfather   . Hypertension Paternal Grandmother   . Alzheimer's disease Paternal Grandmother   . Hypertension Paternal Grandfather   . Cancer Maternal Uncle     melanoma  . Cancer Paternal Uncle     kidney    Past Medical History  Diagnosis Date  . Depression   . GERD (gastroesophageal reflux disease)   . Anxiety   . Asthma   . Migraines   . History of chicken pox   . STD (sexually transmitted disease)     chl hx & hsv 1&2    Past Surgical History  Procedure Laterality Date  . Tonsillectomy  1978    Current Outpatient Prescriptions  Medication Sig Dispense Refill  . albuterol (PROVENTIL HFA;VENTOLIN HFA) 108 (90  BASE) MCG/ACT inhaler Inhale 2 puffs into the lungs every 6 (six) hours as needed. 6.7 g 1  . ARIPiprazole (ABILIFY) 15 MG tablet Take 15 mg by mouth daily.    Marland Kitchen buPROPion (WELLBUTRIN XL) 300 MG 24 hr tablet   0  . famotidine (PEPCID) 20 MG tablet Take 20 mg by mouth 2 (two) times daily.    Marland Kitchen ketoprofen (ORUDIS) 50 MG capsule Take 1 capsule (50 mg total) by mouth every 6 (six) hours as needed. For headache 30 capsule 0  .  LORazepam (ATIVAN) 1 MG tablet   0  . Lurasidone HCl (LATUDA) 60 MG TABS Take 1 tablet by mouth daily.    . meloxicam (MOBIC) 15 MG tablet TAKE 1 TABLET BY MOUTH EVERY DAY 30 tablet 2  . SUMAtriptan (IMITREX) 100 MG tablet ! Tab at onset of HA. May repeat in 2 hours if headache persists or recurs.No more than 4 doses a month. 10 tablet 0  . traZODone (DESYREL) 150 MG tablet Take 150 mg by mouth at bedtime.     Current Facility-Administered Medications  Medication Dose Route Frequency Provider Last Rate Last Dose  . triamcinolone acetonide (KENALOG) 10 MG/ML injection 10 mg  10 mg Other Once Alvan Dame, DPM        Allergies as of 08/31/2014  . (No Known Allergies)    Vitals: BP 130/82 mmHg  Pulse 86  Resp 20  Ht 5' 7.32" (1.71 m)  Wt 384 lb (174.181 kg)  BMI 59.57 kg/m2  LMP 08/29/2014 Last Weight:  Wt Readings from Last 1 Encounters:  08/31/14 384 lb (174.181 kg)       Last Height:   Ht Readings from Last 1 Encounters:  08/31/14 5' 7.32" (1.71 m)    Physical exam:  General: The patient is awake, alert and appears not in acute distress. The patient is well groomed. Head: Normocephalic, atraumatic. Neck is supple. Mallampati 4 , narrow, lower airway. ,  neck circumference: 18 . Nasal airflow restricted, congested, TMJ not evident. Retrognathia is not seen.  Cardiovascular:  Regular rate and rhythm , without  murmurs or carotid bruit, and without distended neck veins. Respiratory: Lungs are clear to auscultation. Skin:  Without evidence of edema, or  rash Trunk: BMI is significantly elevated and patient  has normal posture.  Neurologic exam : The patient is awake and alert, oriented to place and time.   Memory subjective described as intact.  Cranial nerves: Pupils are equal and briskly reactive to light. Funduscopic exam without  evidence of pallor or edema. Extraocular movements  in vertical and horizontal planes intact and without nystagmus. Visual fields by finger perimetry are intact. Hearing to finger rub intact.  Facial sensation intact to fine touch. Facial motor strength is symmetric and tongue and uvula move midline.  Motor exam:   Normal tone ,muscle bulk and symmetric strength in all extremities.  Sensory:  Fine touch, pinprick and vibration were   normal.  Coordination: Rapid alternating movements in the fingers/hands is normal.  Finger-to-nose maneuver normal without evidence of ataxia, dysmetria, but with mild  tremor.  Gait and station: Patient walks without assistive device and is able unassisted to climb up to the exam table.  Strength within normal limits. Stance is stable and normal. Tandem gait is deferred. Deep tendon reflexes: in the  upper and lower extremities are symmetric and intact.   Attenuated by obesity - Babinski maneuver response is downgoing.   Assessment:  After physical and neurologic examination, review of laboratory studies, imaging, neurophysiology testing and pre-existing records, assessment is   Periodic limb movement disorder with a component of clinically significant restless legs, the irresistible urge to move. By this can be provoked by medication the patient has been very long time on the current medications that could contribute to the problem and she is not aware if there is a timely correlation between the prescription and the onset of restless leg syndrome. I will order a ferritin, total iron binding capacity and a CBC , CMET to rule out  metabolic factors that contribute to restless leg  syndrome  Mrs. Butters has significant risk factors for obstructive sleep apnea but also for obesity hypoventilation syndrome, but PSG  was negative. She is migraines, current headaches are 5 days lasting.  She has been treated by her primary care physician this Imitrex which helps but the migraine returns within 24 hours usually. My approach would be to try Frova or Imitrex at 25 mg which she could take 3 times per day to allow her to break the cycle.  She has no associated symptoms of sinusitis or nasal congestion. Her pain is localized to the front of the  head and qualified as a pressure from the outside in. She does not have a vice-like feeling. She does not have associated neck pain. She gets nauseated or has at least is queasy stomach. She has photophobia, smells and sounds also aggravate the headache.    The patient was advised of the nature of the diagnosed sleep disorder , the treatment options and risks for general a health and wellness arising from not treating the condition. Visit duration was 25 minutes.   Plan:  Treatment plan and additional workup :  RLS evaluation to be completed. Migraine headaches were a new problem , evaluated and treated with imitrex 25 mg .  Topiramate 25 mg nightly , after 7 days take 50 mg nightly. Prevention.     I

## 2014-09-01 LAB — COMPREHENSIVE METABOLIC PANEL
ALT: 22 IU/L (ref 0–32)
AST: 18 IU/L (ref 0–40)
Albumin/Globulin Ratio: 1.5 (ref 1.1–2.5)
Albumin: 4.3 g/dL (ref 3.5–5.5)
Alkaline Phosphatase: 85 IU/L (ref 39–117)
BUN/Creatinine Ratio: 13 (ref 9–23)
BUN: 11 mg/dL (ref 6–24)
Bilirubin Total: 0.3 mg/dL (ref 0.0–1.2)
CO2: 23 mmol/L (ref 18–29)
Calcium: 9.5 mg/dL (ref 8.7–10.2)
Chloride: 99 mmol/L (ref 97–108)
Creatinine, Ser: 0.85 mg/dL (ref 0.57–1.00)
GFR calc Af Amer: 97 mL/min/{1.73_m2} (ref >59)
GFR calc non Af Amer: 84 mL/min/{1.73_m2} (ref >59)
Globulin, Total: 2.8 g/dL (ref 1.5–4.5)
Glucose: 109 mg/dL — ABNORMAL HIGH (ref 65–99)
Potassium: 4.4 mmol/L (ref 3.5–5.2)
Sodium: 140 mmol/L (ref 134–144)
Total Protein: 7.1 g/dL (ref 6.0–8.5)

## 2014-09-01 LAB — CBC WITH DIFFERENTIAL/PLATELET
Basophils Absolute: 0 10*3/uL (ref 0.0–0.2)
Basos: 1 %
EOS (ABSOLUTE): 0.2 10*3/uL (ref 0.0–0.4)
Eos: 2 %
Hematocrit: 42.5 % (ref 34.0–46.6)
Hemoglobin: 13.7 g/dL (ref 11.1–15.9)
Immature Grans (Abs): 0 10*3/uL (ref 0.0–0.1)
Immature Granulocytes: 0 %
Lymphocytes Absolute: 2.3 10*3/uL (ref 0.7–3.1)
Lymphs: 28 %
MCH: 29.1 pg (ref 26.6–33.0)
MCHC: 32.2 g/dL (ref 31.5–35.7)
MCV: 90 fL (ref 79–97)
Monocytes Absolute: 0.6 10*3/uL (ref 0.1–0.9)
Monocytes: 7 %
Neutrophils Absolute: 5.3 10*3/uL (ref 1.4–7.0)
Neutrophils: 62 %
Platelets: 213 10*3/uL (ref 150–379)
RBC: 4.71 x10E6/uL (ref 3.77–5.28)
RDW: 14 % (ref 12.3–15.4)
WBC: 8.4 10*3/uL (ref 3.4–10.8)

## 2014-09-01 LAB — FERRITIN: Ferritin: 82 ng/mL (ref 15–150)

## 2014-09-01 LAB — IRON AND TIBC
Iron Saturation: 17 % (ref 15–55)
Iron: 59 ug/dL (ref 27–159)
Total Iron Binding Capacity: 346 ug/dL (ref 250–450)
UIBC: 287 ug/dL (ref 131–425)

## 2014-09-04 ENCOUNTER — Telehealth: Payer: Self-pay

## 2014-09-04 NOTE — Telephone Encounter (Signed)
-----   Message from Melvyn Novasarmen Dohmeier, MD sent at 09/01/2014  2:08 PM EDT ----- Normal blood cell count

## 2014-09-04 NOTE — Telephone Encounter (Signed)
Called pt to tell her that her lab work was normal. Pt verbalized understanding.

## 2014-09-05 ENCOUNTER — Other Ambulatory Visit: Payer: Self-pay

## 2014-09-13 ENCOUNTER — Other Ambulatory Visit: Payer: Self-pay

## 2014-09-25 ENCOUNTER — Other Ambulatory Visit: Payer: Self-pay

## 2014-09-25 ENCOUNTER — Ambulatory Visit (HOSPITAL_COMMUNITY)
Admission: RE | Admit: 2014-09-25 | Discharge: 2014-09-25 | Disposition: A | Discharge status: Home / Self Care | Payer: BLUE CROSS/BLUE SHIELD | Source: Ambulatory Visit | Attending: Surgery | Admitting: Surgery

## 2014-09-25 DIAGNOSIS — R0683 Snoring: Secondary | ICD-10-CM | POA: Insufficient documentation

## 2014-09-25 DIAGNOSIS — E662 Morbid (severe) obesity with alveolar hypoventilation: Secondary | ICD-10-CM | POA: Diagnosis not present

## 2014-09-25 DIAGNOSIS — R51 Headache: Secondary | ICD-10-CM | POA: Insufficient documentation

## 2014-09-25 DIAGNOSIS — K219 Gastro-esophageal reflux disease without esophagitis: Secondary | ICD-10-CM | POA: Diagnosis not present

## 2014-09-25 DIAGNOSIS — K760 Fatty (change of) liver, not elsewhere classified: Secondary | ICD-10-CM | POA: Diagnosis not present

## 2014-09-25 DIAGNOSIS — Z6841 Body Mass Index (BMI) 40.0 and over, adult: Secondary | ICD-10-CM | POA: Insufficient documentation

## 2014-09-25 DIAGNOSIS — Z1231 Encounter for screening mammogram for malignant neoplasm of breast: Secondary | ICD-10-CM | POA: Insufficient documentation

## 2014-09-25 DIAGNOSIS — R351 Nocturia: Secondary | ICD-10-CM | POA: Insufficient documentation

## 2014-09-26 NOTE — Telephone Encounter (Signed)
Pt needs an appt if continuing to have problems. 

## 2014-10-09 ENCOUNTER — Encounter: Payer: BLUE CROSS/BLUE SHIELD | Attending: Surgery | Admitting: Dietician

## 2014-10-09 ENCOUNTER — Encounter: Payer: Self-pay | Admitting: Dietician

## 2014-10-09 DIAGNOSIS — Z6841 Body Mass Index (BMI) 40.0 and over, adult: Secondary | ICD-10-CM | POA: Diagnosis not present

## 2014-10-09 DIAGNOSIS — Z713 Dietary counseling and surveillance: Secondary | ICD-10-CM | POA: Insufficient documentation

## 2014-10-09 NOTE — Progress Notes (Signed)
  Pre-Op Assessment Visit:  Pre-Operative Sleeve Gastrectomy Surgery  Medical Nutrition Therapy:  Appt start time: 1115   End time:  1145.  Patient was seen on 10/09/2014 for Pre-Operative Nutrition Assessment. Assessment and letter of approval faxed to [facility] Bariatric Surgery Program coordinator on 10/09/2014.   Preferred Learning Style:   No preference indicated   Learning Readiness:   Ready  Handouts given during visit include:  Pre-Op Goals Bariatric Surgery Protein Shakes   During the appointment today the following Pre-Op Goals were reviewed with the patient: Maintain or lose weight as instructed by your surgeon Make healthy food choices Begin to limit portion sizes Limited concentrated sugars and fried foods Keep fat/sugar in the single digits per serving on   food labels Practice CHEWING your food  (aim for 30 chews per bite or until applesauce consistency) Practice not drinking 15 minutes before, during, and 30 minutes after each meal/snack Avoid all carbonated beverages  Avoid/limit caffeinated beverages  Avoid all sugar-sweetened beverages Consume 3 meals per day; eat every 3-5 hours Make a list of non-food related activities Aim for 64-100 ounces of FLUID daily  Aim for at least 60-80 grams of PROTEIN daily Look for a liquid protein source that contain ?15 g protein and ?5 g carbohydrate  (ex: shakes, drinks, shots)  Patient-Centered Goals: Goals: would like to be able to walk and exercise more easily and "have a life"  10 level of confidence/10 level of importance  Demonstrated degree of understanding via:  Teach Back  Teaching Method Utilized:  Visual Auditory Hands on  Barriers to learning/adherence to lifestyle change: none  [facility] to [facility] to enroll in Pre-Op and Post-Op Nutrition Education when surgery date is scheduled.

## 2014-10-09 NOTE — Patient Instructions (Signed)
Follow Pre-Op Goals Try Protein Shakes Call [facility] at [phone] when surgery is scheduled to enroll in Pre-Op Class  Things to remember:  Please always be honest with us. We want to support you!  If you have any questions or concerns in between appointments, please call or email Liz, Leslie, or Laurie.  The diet after surgery will be high protein and low in carbohydrate.  Vitamins and calcium need to be taken for the rest of your life.  Feel free to include support people in any classes or appointments.  

## 2014-10-10 ENCOUNTER — Ambulatory Visit (INDEPENDENT_AMBULATORY_CARE_PROVIDER_SITE_OTHER): Payer: BLUE CROSS/BLUE SHIELD | Admitting: Neurology

## 2014-10-10 ENCOUNTER — Encounter: Payer: Self-pay | Admitting: Neurology

## 2014-10-10 VITALS — BP 134/84 | HR 86 | Resp 20 | Ht 67.0 in | Wt 393.0 lb

## 2014-10-10 DIAGNOSIS — G43009 Migraine without aura, not intractable, without status migrainosus: Secondary | ICD-10-CM | POA: Diagnosis not present

## 2014-10-10 MED ORDER — ROPINIROLE HCL 0.25 MG PO TABS: 0.2500 mg | ORAL_TABLET | Freq: Three times a day (TID) | ORAL | Status: DC

## 2014-10-10 MED ORDER — KETOPROFEN 50 MG PO CAPS: 50.0000 mg | ORAL_CAPSULE | Freq: Four times a day (QID) | ORAL | Status: DC | PRN

## 2014-10-10 NOTE — Progress Notes (Signed)
SLEEP MEDICINE CLINIC   Provider:  Melvyn Novas, M D  Referring Provider: Lorre Munroe, NP Primary Care Physician:  Nicki Reaper, NP  Chief Complaint  Patient presents with  . Follow-up    RLS study, and headaches, rm 10, alone    HPI:  Alyssa Blanchard is a 44 y.o. female seen here as a referral  from NP   Baity , Meredith Staggers, MD. [facility].  Alyssa Blanchard reports that she has not  Been able to sleep soundly in a while. She is orthopneic and has to sleep with an elevated body -therefore she prefers to sleep in a recliner. Her sleep habits are as follows she usually goes to bed around 10:50 PM and she will promptly fall asleep she reports. She wakes up about every 2 hours at 2, 4 and 6 AM. Sometimes it takes her so long to get back to sleep that she will pass just resting or dosing between true arousals. She reports that some of her arousals and awakenings are due to and jolted feeling like a sleep myoclonus and some are due to nocturia. Sometimes she will go to the bathroom more than 3 times at night. She also has a tendency to have nasal allergies and drainage. This makes her a mouth breather especially during allergy season. In March she developed severe nasal congestion and drainage was pass. She also headaches associated with it a pressure on her cheeks and 4 had the sore throat she was coughing and wheezing. He has overcome this by now but she still has some drainage. She also reports that she wakes up with headaches and these of migrainous nature. She usually wakes up with them my gr not with another kind of headache. Her migraines do not get better as the day goes on to the may not be simply sleep related. She has been fighting with morbid obesity since childhood. She was heavy all her life. An exacerbation took place when she was diagnosed with bipolar disorder and the medication caused weight gain about 5 years ago.  She craves always more sleep, getting about 6 hours at  night, not feeling refreshed in the morning. All day long she feels tired. She drinks a lot of caffeine. She doesn't drink alcohol.  Her father snored,-he had apnea, was daytime sleepy- hypersomnic. He died at 44 years of age , due to pancreatic cancer.  Her mother has very fragmented sleep , the patient lives with her. Both women sleep in recliners. The patient has bronchitis with wheezing and uses inhalers.   Interval history from 08-31-14,  After our last visit Alyssa Blanchard underwent a sleep study on 07-18-14. She had endorsed the Epworth sleepiness score at 18 out of 24 points and the pH Q9 form for depression at 15 points the patient had an unusual results for her sleep study. I have strongly expected a high apnea index but her AHI was 1.9 and her RDI was 3.3. 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 She slept in her bed with an elevated chest. She slept supine. She did not retain CO2. The patient had 78 periodic limb movements at night and her arousal index from kicking or jerking was 11.3. The polysomnogram did not show tachybradycardia arrhythmia, oxygen desaturations CO2 retention or evidence of central or obstructive sleep apnea. The only findings were significant periodic limb movement related  arousals. She does report that she has restless legs that she has the irresistible urge to move her legs at night or at rest and that she sometimes has to massage or rubber latex 2. She does not drink caffeine at it beverages or obstructed, she is on some medications that may actually foster periodic limb movements namely Abilify, Wellbutrin, and dizzy rectal what I will do today is to obtain a serum ferritin level and evaluate her for iron deficiency. She has neither neuropathy,  No history of back pain or spinal stenosis. RLS evaluation to be completed. Migraine headaches were a new problem , evaluated and treated with  imitrex 25 mg .  Topiramate 25 mg nightly , after 7 days take 50 mg nightly. Prevention.  CD  Interval history from 10-10-14. Alyssa Blanchard report that she has again headaches.  Topiramate caused her to be too drowsy and sleepy all day and she had to discontinue it if that the used to rather low dose of 25 mg daily.  We also reviewing her lab results here together 1 months ago due to blood and she had a white blood cell count of 8.4 red blood cell count of 4.7 hemoglobin of 13.7 hematocrit of 42.5. This was in normal range a differential also was normal her fasting glucose was elevated, she had normal kidney and liver function and electrolytes. She has a very low normal iron saturation of 17% free iron 59 mcg/dL is also in the lower range of normal, iron binding capacity is high at 346 mcg/dL, think it is likely that she has some mild iron deficiency without anemia. The patient underwent a sleep study on 07-18-14 which revealed in her last visit with me on 08-31-14. Her Epworth sleepiness score was 18 points and she had no significant apnea - to my surprise -but frequent PLM related arousals.    Her legs always bounce,  but she doesn't have the irresistible urge to move or walk on the side.  There is currently no pain or discomfort with them.  Her extensive daytime sleepiness has continued, and apnea was obviously not the cause. Anemia has not been  present and she still suffers from migraine without aura sometimes lasting a day or longer.  She also endorsed that her limb restlessness is overall very severe she does not have to move around and walk but she is constantly moving her leg while remaining in a seated position. That gives him moderate relief. She feels that her sleep is disturbed by the leg movements of the sensation in her legs, and she feels severely tired in daytime of as a whole she feels impaired. 4-5 days a week are affected by limb restlessness.      Review of Systems: Out of a complete  14 system review, the patient complains of only the following symptoms, and all other reviewed systems are negative. Sleep problems. Depression in the past. Tremor, pain in shoulder, chest, cannot sleep on her side. Morbid obesity.  On 10-10-14 her Epworth sleepiness score is 12 and her fatigue severity score is 60. her legs incessantly. The patient had no imaging study of the brain since followed in our office for migraines.   Epworth score 18  , Fatigue severity score 63   , depression score : 'i feel not depressed"    History   Social History  . Marital Status: Single    Spouse Name: N/A  . Number of Children: N/A  . Years of Education: 23  Occupational History  . [employer]   Social History Main Topics  . Smoking status: Former Smoker    Quit date: 03/10/2013  . Smokeless tobacco: Never Used  . Alcohol Use: 0.0 oz/week    0 Standard drinks or equivalent per week     Comment: 1 a month  . Drug Use: No  . Sexual Activity:    Partners: Male    Birth Control/ Protection: Condom   Other Topics Concern  . Not on file   Social History Narrative   Regular exercise-no   Caffeine Use-yes, 1-2 cups of caffeine daily    Family History  Problem Relation Age of Onset  . Diabetes Mother   . Hypertension Mother   . Hyperlipidemia Mother   . Kidney disease Mother   . Cancer Father     pancreatic  . Hypertension Maternal Grandmother   . Diabetes Maternal Grandmother   . Heart failure Maternal Grandmother     CHF  . Heart attack Maternal Grandfather   . Hypertension Maternal Grandfather   . Hypertension Paternal Grandmother   . Alzheimer's disease Paternal Grandmother   . Hypertension Paternal Grandfather   . Cancer Maternal Uncle     melanoma  . Cancer Paternal Uncle     kidney    Past Medical History  Diagnosis Date  . Depression   . GERD (gastroesophageal reflux disease)   . Anxiety   . Asthma   . Migraines   . History of chicken pox   .  STD (sexually transmitted disease)     chl hx & hsv 1&2    Past Surgical History  Procedure Laterality Date  . Tonsillectomy  1978    Current Outpatient Prescriptions  Medication Sig Dispense Refill  . albuterol (PROVENTIL HFA;VENTOLIN HFA) 108 (90 BASE) MCG/ACT inhaler Inhale 2 puffs into the lungs every 6 (six) hours as needed. 6.7 g 1  . ARIPiprazole (ABILIFY) 15 MG tablet Take 15 mg by mouth daily.    Marland Kitchen buPROPion (WELLBUTRIN XL) 300 MG 24 hr tablet   0  . famotidine (PEPCID) 20 MG tablet Take 20 mg by mouth 2 (two) times daily.    Marland Kitchen ketoprofen (ORUDIS) 50 MG capsule Take 1 capsule (50 mg total) by mouth every 6 (six) hours as needed. For headache 30 capsule 0  . LORazepam (ATIVAN) 1 MG tablet   0  . Lurasidone HCl (LATUDA) 60 MG TABS Take 1 tablet by mouth daily.    . meloxicam (MOBIC) 15 MG tablet TAKE 1 TABLET BY MOUTH EVERY DAY 30 tablet 2  . SUMAtriptan (IMITREX) 100 MG tablet ! Tab at onset of HA. May repeat in 2 hours if headache persists or recurs.No more than 4 doses a month. 10 tablet 0  . topiramate (TOPAMAX) 25 MG tablet Take 1 tablet (25 mg total) by mouth 2 (two) times daily. 60 tablet 5  . traZODone (DESYREL) 150 MG tablet Take 150 mg by mouth at bedtime.     Current Facility-Administered Medications  Medication Dose Route Frequency Provider Last Rate Last Dose  . triamcinolone acetonide (KENALOG) 10 MG/ML injection 10 mg  10 mg Other Once Alvan Dame, DPM        Allergies as of 10/10/2014  . (No Known Allergies)    Vitals: BP 134/84 mmHg  Pulse 86  Resp 20  Ht 5\' 7"  (1.702 m)  Wt 393 lb (178.264 kg)  BMI 61.54 kg/m2 Last Weight:  Wt Readings from Last 1 Encounters:  10/10/14 393 lb (178.264 kg)       Last Height:   Ht Readings from Last 1 Encounters:  10/10/14  (1.702 m)    Physical exam:  General: The patient is awake, alert and appears not in acute distress. The patient is well groomed. Head: Normocephalic, atraumatic. Neck is supple.  Mallampati 4 , narrow, lower airway. ,  neck circumference: 18 . Nasal airflow restricted, congested, TMJ not evident. Retrognathia is not seen.  Cardiovascular:  Regular rate and rhythm , without  murmurs or carotid bruit, and without distended neck veins. Respiratory: Lungs are clear to auscultation. Skin:  Without evidence of edema, or rash Trunk: BMI is significantly elevated and patient  has normal posture.  Neurologic exam : The patient is awake and alert, oriented to place and time.   Memory subjective described as intact.  Cranial nerves: Pupils are equal and briskly reactive to light. Funduscopic exam without  evidence of pallor or edema. Extraocular movements  in vertical and horizontal planes intact and without nystagmus. Visual fields by finger perimetry are intact. Hearing to finger rub intact.  Facial sensation intact to fine touch. Facial motor strength is symmetric and tongue and uvula move midline.  Motor exam:   Normal tone ,muscle bulk and symmetric strength in all extremities.  Sensory:  Fine touch, pinprick and vibration were  normal.  Coordination: Rapid alternating movements in the fingers/hands is normal.  Finger-to-nose maneuver normal without evidence of ataxia, dysmetria, but with mild  tremor.  Gait and station: Patient walks without assistive device and is able unassisted to climb up to the exam table.  Strength within normal limits. Stance is stable and normal. Tandem gait is deferred. Deep tendon reflexes: in the  upper and lower extremities are symmetric and intact.   Attenuated by obesity - Babinski maneuver response is downgoing.   Assessment:  After physical and neurologic examination, review of laboratory studies, imaging, neurophysiology testing and pre-existing records, assessment is   Periodic limb movement disorder with a component of clinically significant restless legs, the irresistible urge to move. By this can be provoked by medication the  patient has been very long time on the current medications that could contribute to the problem and she is not aware if there is a timely correlation between the prescription and the onset of restless leg syndrome. I will order a ferritin, total iron binding capacity and a CBC , CMET to rule out metabolic factors that contribute to restless leg syndrome  Alyssa Blanchard has significant risk factors for obstructive sleep apnea but also for obesity hypoventilation syndrome, but PSG  was negative.  She has migraines, current headaches are lasting 5 days.  She has been treated by her primary care physician this Imitrex which helps but the migraine returns within 24 hours usually.  My approach would be to try Frova or Imitrex at 25 mg which she could take 3 times per day to allow her to break the cycle.  She has no associated symptoms of sinusitis or nasal congestion. Her pain is localized to the front of the  head and qualified as a pressure from the outside in.  She does not have a vice-like feeling. She does not have associated neck pain. She gets nauseated or has at least is queasy stomach. She has photophobia, smells and sounds also aggravate the headache.     The patient was advised of the nature of the diagnosed sleep disorder , the treatment options and risks for general  a health and wellness arising from not treating the condition. Visit duration was 25 minutes.   Plan:  Treatment plan and additional workup :  As to the patient restless limbs she feels that there is no discomfort or pain and not the feeling of having to walk the sensation off. She is more trembling or twitching when seated on the spot. Her iron studies were in the low normal range. The restless leg impairment questionnaire was endorsed at a high count of 35 points. She is not taking oral iron. She is in preparation for a weight loss surgery.  I will offer Requip , 0.25 mg . Order MRi brain for migraine work up. triptans  used in  the treatment of migraine gave her chest tightness, transiently.  She is using KETOPROFEN with better success.  Uses Ketoprofen PRN.        I

## 2014-10-10 NOTE — Patient Instructions (Signed)
Ketoprofen tablets or capsules What is this medicine? KETOPROFEN (kee toe PROE fen) is a non-steroidal anti-inflammatory drug (NSAID). It is used to reduce swelling and to treat mild to moderate pain. This medicine may be used to treat osteoarthritis and rheumatoid arthritis, or painful monthly periods. This medicine may be used for other purposes; ask your health care provider or pharmacist if you have questions. COMMON BRAND NAME(S): Actron, Orudis What should I tell my health care provider before I take this medicine? They need to know if you have any of these conditions: -asthma, especially aspirin sensitive asthma -coronary artery bypass graft (CABG) surgery within the past 2 weeks -drink more than 3 alcohol-containing drinks a day -heart disease or circulation problems like heart failure or leg edema (fluid retention) -high blood pressure -kidney disease -liver disease -stomach bleeding or ulcers -an unusual or allergic reaction to ketoprofen, aspirin, other NSAIDs, other medicines, lactose, foods, dyes, or preservatives -pregnant or trying to get pregnant -breast-feeding How should I use this medicine? Take this medicine by mouth full glass of water. Follow the directions on the prescription label. Take this medicine with food. Take your medicine at regular intervals. Do not take your medicine more often than directed. Long-term, continuous use may increase the risk of heart attack or stroke. A special MedGuide will be given to you by the pharmacist with each prescription and refill. Be sure to read this information carefully each time. Talk to your pediatrician regarding the use of this medicine in children. Elderly patients over 25 years old may have a stronger reaction and need a smaller dose. Overdosage: If you think you have taken too much of this medicine contact a poison control center or emergency room at once. NOTE: This medicine is only for you. Do not share this medicine  with others. What if I miss a dose? If you miss a dose, take it as soon as you can. If it is almost time for your next dose, take only that dose. Do not take double or extra doses. What may interact with this medicine? Do not take this medicine with any of the following medications: -cidofovir -ketorolac -methotrexate -pemetrexed This medicine may also interact with the following medications: -alcohol -aspirin and aspirin-like medicines -diuretics -lithium -medicines for high blood pressure -medicines that affect platelets -medicines that treat or prevent blood clots like warfarin -NSAIDs, medicines for pain and inflammation, like ibuprofen or naproxen -probenecid -steroid medicines like prednisone or cortisone This list may not describe all possible interactions. Give your health care provider a list of all the medicines, herbs, non-prescription drugs, or dietary supplements you use. Also tell them if you smoke, drink alcohol, or use illegal drugs. Some items may interact with your medicine. What should I watch for while using this medicine? Tell your doctor or health care professional if your pain does not get better. Talk to your doctor before taking another medicine for pain. Do not treat yourself. This medicine does not prevent heart attack or stroke. In fact, this medicine may increase the chance of a heart attack or stroke. The chance may increase with longer use of this medicine and in people who have heart disease. If you take aspirin to prevent heart attack or stroke, talk with your doctor or health care professional. Do not take medicines such as ibuprofen and naproxen with this medicine. Side effects such as stomach upset, nausea, or ulcers may be more likely to occur. Many medicines available without a prescription should not be taken with  this medicine. This medicine can cause ulcers and bleeding in the stomach and intestines at any time during treatment. Do not smoke  cigarettes or drink alcohol. These increase irritation to your stomach and can make it more susceptible to damage from this medicine. Ulcers and bleeding can happen without warning symptoms and can cause death. You may get drowsy or dizzy. Do not drive, use machinery, or do anything that needs mental alertness until you know how this medicine affects you. Do not stand or sit up quickly, especially if you are an older patient. This reduces the risk of dizzy or fainting spells. This medicine can cause you to bleed more easily. Try to avoid damage to your teeth and gums when you brush or floss your teeth. What side effects may I notice from receiving this medicine? Side effects that you should report to your doctor or health care professional as soon as possible: -allergic reactions like skin rash, itching or hives, swelling of the face, lips, or tongue -changes in vision -chest pain -difficulty breathing or wheezing -nausea or vomiting -redness, blistering, peeling or loosening of the skin, including inside the mouth -slurred speech or weakness on one side of the body -unexplained weight gain or swelling -unusual bleeding or bruising -unusually weak or tired -yellowing of eyes or skin Side effects that usually do not require medical attention (report to your doctor or health care professional if they continue or are bothersome): -diarrhea -dizziness -headache -heartburn This list may not describe all possible side effects. Call your doctor for medical advice about side effects. You may report side effects to FDA at 1-800-FDA-1088. Where should I keep my medicine? Keep out of the reach of children. Store at room temperature between 20 and 25 degrees C (68 and 77 degrees F). Keep container tightly closed. Protect from light. Throw away any unused medicine after the expiration date. NOTE: This sheet is a summary. It may not cover all possible information. If you have questions about this  medicine, talk to your doctor, pharmacist, or health care provider.  2015, Elsevier/Gold Standard. (2007-07-15 17:22:42).   Enrolllment into the RLS is pending.

## 2014-10-31 NOTE — Progress Notes (Signed)
  Pre-Operative Nutrition Class:  Appt start time: 0919   End time:  1830.  Patient was seen on 10/30/14 for Pre-Operative Bariatric Surgery Education at [facility].   Surgery date: 11/27/14 Surgery type: Sleeve gastrectomy Start weight at [facility]: 394 lbs on 10/09/14 Weight today: 393 lbs  TANITA  BODY COMP RESULTS  10/30/14   BMI (kg/m^2) 61.6   Fat Mass (lbs) 221.5   Fat Free Mass (lbs) 171.5   Total Body Water (lbs) 125.5   Samples given per MNT protocol. Patient educated on appropriate usage: Celebrate Calcium Citrate chew (berry - qty 1) Lot #: [id number] Exp: 05/2016  Premier protein shake (chocolate - qty 1) Lot #: [id number] Exp: 06/2015  Unjury protein powder (strawberry - qty 1) Lot #: [id number] Exp: 11/2015  PB2 (chocolate - qty 1) Lot #: [id number] Exp: 01/2015  The following the learning objectives were met by the patient during this course:  Identify Pre-Op Dietary Goals and will begin 2 weeks pre-operatively  Identify appropriate sources of fluids and proteins   State protein recommendations and appropriate sources pre and post-operatively  Identify Post-Operative Dietary Goals and will follow for 2 weeks post-operatively  Identify appropriate multivitamin and calcium sources  Describe the need for physical activity post-operatively and will follow MD recommendations  State when to call healthcare provider regarding medication questions or post-operative complications  Handouts given during class include:  Pre-Op Bariatric Surgery Diet Handout  Protein Shake Handout  Post-Op Bariatric Surgery Nutrition Handout  [facility]EL[facility] Program Information Flyer  Support Group Information Flyer  [facility] Bariatric Supplements Price List  Follow-Up Plan: Patient will follow-up at [facility] 2 weeks post operatively for diet advancement per MD.

## 2014-11-06 NOTE — Progress Notes (Signed)
Please put orders in EPIC as patient has a pre-op appointment at [facility]- admissions on 11/23/2014 at 215pm! Thank you!

## 2014-11-20 ENCOUNTER — Ambulatory Visit: Payer: Self-pay | Admitting: Surgery

## 2014-11-20 NOTE — H&P (Signed)
Alyssa Blanchard Location: [facility] Patient #: [id number] DOB: 1970-07-13 Single / Language: [place] / Race: White Female  History of Present Illness  The patient is a 44 year old female who presents for a bariatric surgery evaluation.  I saw her in July of 2014. She has been to our seminar and has done a lot of research on bariatric surgery. She is BMI 61 with no DM. We discussed lapband and sleeve gastrectomy particularly the sleeve gastrectomy in some detail including risks of bleeding and leaks. She seems familiar with these. She wants to moved forward with sleeve gastectomy. She had a recent sleep study and doesn't have OSA. She does GERD and an UGI was reported as normal. No history of DVT. No prior abdominal surgery.  She has tried numerous attemtps to lose weight without sustained success. She arrives at this decision after long consideration of her options and trying to lose weight on he own. She is presenting for sleeve gastrectomy.    Other Problems Fay Records, CMA; 08/25/2014 9:23 AM) Anxiety Disorder Depression Gastroesophageal Reflux Disease Migraine Headache  Past Surgical History Fay Records, CMA; 08/25/2014 9:23 AM) Tonsillectomy  Diagnostic Studies History Fay Records, CMA; 08/25/2014 9:23 AM) Colonoscopy never Mammogram never Pap Smear 1-5 years ago  Allergies Fay Records, CMA; 08/25/2014 9:23 AM) No Known Drug Allergies06/17/2016  Medication History Fay Records, CMA; 08/25/2014 9:25 AM) BuPROPion HCl ER (XL) (  Tablet ER 24HR, Oral) Active. Latuda (  Tablet, Oral) Active. Meloxicam (  Tablet, Oral) Active. TraZODone HCl (  Tablet, Oral) Active. LORazepam (  Tablet, Oral two times daily) Active. Famotidine (  Tablet, Oral two times daily) Active. Acetaminophen (  Tablet, Oral) Active. Medications Reconciled  Social History Fay Records, [place]; 08/25/2014 9:23 AM) Alcohol use Occasional alcohol  use. Caffeine use Carbonated beverages. No drug use Tobacco use Former smoker.  Family History Fay Records, [place]; 08/25/2014 9:23 AM) Colon Polyps Father. Depression Mother. Diabetes Mellitus Father, Mother. Heart disease in female family member before age 45 Hypertension Mother. Kidney Disease Mother. Malignant Neoplasm Of Pancreas Father.  Pregnancy / Birth History Fay Records, CMA; 08/25/2014 9:23 AM) Age at menarche 12 years. Contraceptive History Oral contraceptives. Gravida 2 Maternal age 58-20 Para 0 Regular periods  Review of Systems Fay Records CMA; 08/25/2014 9:23 AM) General Present- Fatigue. Not Present- Appetite Loss, Chills, Fever, Night Sweats, Weight Gain and Weight Loss. Skin Present- Dryness. Not Present- Change in Wart/Mole, Hives, Jaundice, New Lesions, Non-Healing Wounds, Rash and Ulcer. HEENT Not Present- Earache, Hearing Loss, Hoarseness, Nose Bleed, Oral Ulcers, Ringing in the Ears, Seasonal Allergies, Sinus Pain, Sore Throat, Visual Disturbances, Wears glasses/contact lenses and Yellow Eyes. Respiratory Present- Snoring. Not Present- Bloody sputum, Chronic Cough, Difficulty Breathing and Wheezing. Breast Not Present- Breast Mass, Breast Pain, Nipple Discharge and Skin Changes. Cardiovascular Present- Difficulty Breathing Lying Down. Not Present- Chest Pain, Leg Cramps, Palpitations, Rapid Heart Rate, Shortness of Breath and Swelling of Extremities. Gastrointestinal Present- Indigestion. Not Present- Abdominal Pain, Bloating, Bloody Stool, Change in Bowel Habits, Chronic diarrhea, Constipation, Difficulty Swallowing, Excessive gas, Gets full quickly at meals, Hemorrhoids, Nausea, Rectal Pain and Vomiting. Female Genitourinary Present- Urgency. Not Present- Frequency, Nocturia, Painful Urination and Pelvic Pain. Musculoskeletal Not Present- Back Pain, Joint Pain, Joint Stiffness, Muscle Pain, Muscle Weakness and Swelling of  Extremities. Neurological Present- Headaches. Not Present- Decreased Memory, Fainting, Numbness, Seizures, Tingling, Tremor, Trouble walking and Weakness. Psychiatric Present- Anxiety and Bipolar. Not Present- Change in Sleep Pattern, Depression, Fearful and Frequent crying. Endocrine Not Present- Cold Intolerance, Excessive  Hunger, Hair Changes, Heat Intolerance, Hot flashes and New Diabetes. Hematology Not Present- Easy Bruising, Excessive bleeding, Gland problems, HIV and Persistent Infections.   Vitals Fay Records CMA; 08/25/2014 9:26 AM) 08/25/2014 9:25 AM Weight: 392 lb Height: 67in Body Surface Area: 2.9 m Body Mass Index: 61.4 kg/m Temp.: 98.71F(Oral)  Pulse: 99 (Regular)  Resp.: 17 (Unlabored)  BP: 130/78 (Sitting, Left Arm, Standard) Physical Exam (Ranata Laughery B. Daphine Deutscher MD; 08/25/2014 9:58 AM) The physical exam findings are as follows: Note:Superobese white female in no acute distress Head and Neck Note: normorcephalic neck without masses or adenopathy Eye Note: sclearae non icteric PERRL ENMT Note: no oral mucosal lesions Chest and Lung Exam Note: clear to auscultation Breast Note: not examined Cardiovascular Note: SR without murmurs Abdomen Note: obese and nontender Neurologic Note: alert and oriented x3; motor and sensory are grossly intact  Impression  Morbid obesity for sleeve gastrectomy  I have seen and examined this patient and agree with the assessment and plan.   Matt B. Daphine Deutscher, MD, [facility] [phone] beeper [phone]  11/20/2014 5:06 PM

## 2014-11-20 NOTE — Progress Notes (Signed)
Please put orders in Epic surgery 11-27-14 pre op 11-23-14 Thanks

## 2014-11-23 ENCOUNTER — Encounter (HOSPITAL_COMMUNITY): Payer: Self-pay

## 2014-11-23 ENCOUNTER — Encounter (HOSPITAL_COMMUNITY)
Admission: RE | Admit: 2014-11-23 | Discharge: 2014-11-23 | Disposition: A | Discharge status: Home / Self Care | Payer: BLUE CROSS/BLUE SHIELD | Source: Ambulatory Visit | Attending: Surgery | Admitting: Surgery

## 2014-11-23 DIAGNOSIS — Z01818 Encounter for other preprocedural examination: Secondary | ICD-10-CM | POA: Diagnosis not present

## 2014-11-23 HISTORY — DX: Plantar fascial fibromatosis: M72.2

## 2014-11-23 HISTORY — DX: Personal history of other diseases of the respiratory system: Z87.09

## 2014-11-23 HISTORY — DX: Tremor, unspecified: R25.1

## 2014-11-23 HISTORY — DX: Unspecified urinary incontinence: R32

## 2014-11-23 HISTORY — DX: Calcaneal spur, unspecified foot: M77.30

## 2014-11-23 HISTORY — DX: Personal history of urinary (tract) infections: Z87.440

## 2014-11-23 LAB — CBC WITH DIFFERENTIAL/PLATELET
Basophils Absolute: 0 10*3/uL (ref 0.0–0.1)
Basophils Relative: 0 %
Eosinophils Absolute: 0.1 10*3/uL (ref 0.0–0.7)
Eosinophils Relative: 1 %
HCT: 42.6 % (ref 36.0–46.0)
Hemoglobin: 14 g/dL (ref 12.0–15.0)
Lymphocytes Relative: 21 %
Lymphs Abs: 2 10*3/uL (ref 0.7–4.0)
MCH: 30 pg (ref 26.0–34.0)
MCHC: 32.9 g/dL (ref 30.0–36.0)
MCV: 91.2 fL (ref 78.0–100.0)
Monocytes Absolute: 0.8 10*3/uL (ref 0.1–1.0)
Monocytes Relative: 8 %
Neutro Abs: 6.7 10*3/uL (ref 1.7–7.7)
Neutrophils Relative %: 70 %
Platelets: 221 10*3/uL (ref 150–400)
RBC: 4.67 MIL/uL (ref 3.87–5.11)
RDW: 13.8 % (ref 11.5–15.5)
WBC: 9.6 10*3/uL (ref 4.0–10.5)

## 2014-11-23 LAB — COMPREHENSIVE METABOLIC PANEL
ALT: 44 U/L (ref 14–54)
AST: 35 U/L (ref 15–41)
Albumin: 4.5 g/dL (ref 3.5–5.0)
Alkaline Phosphatase: 79 U/L (ref 38–126)
Anion gap: 9 (ref 5–15)
BUN: 15 mg/dL (ref 6–20)
CO2: 27 mmol/L (ref 22–32)
Calcium: 9.3 mg/dL (ref 8.9–10.3)
Chloride: 104 mmol/L (ref 101–111)
Creatinine, Ser: 0.78 mg/dL (ref 0.44–1.00)
GFR calc Af Amer: >60 mL/min (ref >60)
GFR calc non Af Amer: >60 mL/min (ref >60)
Glucose, Bld: 113 mg/dL — ABNORMAL HIGH (ref 65–99)
Potassium: 4.7 mmol/L (ref 3.5–5.1)
Sodium: 140 mmol/L (ref 135–145)
Total Bilirubin: 0.7 mg/dL (ref 0.3–1.2)
Total Protein: 8 g/dL (ref 6.5–8.1)

## 2014-11-23 NOTE — Progress Notes (Signed)
CXR/epic 09/25/2014 EKG/epic 09/25/2014

## 2014-11-23 NOTE — Progress Notes (Signed)
Spoke with Francee Piccolo in portable equipment in regards to ordering bariatric bed postop 11/27/2014.

## 2014-11-23 NOTE — Patient Instructions (Signed)
Alyssa Blanchard  11/23/2014   Your procedure is scheduled on: Monday November 27, 2014   Report to [facility] Main  Entrance take [place]  elevators to 3rd floor to  Short Stay Center at 11:15 AM.  Call this number if you have problems the morning of surgery [phone]   Remember: ONLY 1 PERSON MAY GO WITH YOU TO SHORT STAY TO GET  READY MORNING OF YOUR SURGERY.  Do not eat food After Midnight but may take clear liquids till 7:15 am day of surgery then nothing by mouth.      Take these medicines the morning of surgery with A SIP OF WATER: Bupropion (Wellbutrin); Famotidine (Pepcid); Pantoprazole (Protonix); Lorazepam (Ativan) if needed; May use albuterol inhaler if needed (bring with you day of surgery)                               You may not have any metal on your body including hair pins and              piercings  Do not wear jewelry, make-up, lotions, powders or perfumes, deodorant             Do not wear nail polish.  Do not shave  48 hours prior to surgery.                Do not bring valuables to the hospital. [facility] IS NOT             RESPONSIBLE   FOR VALUABLES.  Contacts, dentures or bridgework may not be worn into surgery.  Leave suitcase in the car. After surgery it may be brought to your room.   _____________________________________________________________________             [facility] - Preparing for Surgery Before surgery, you can play an important role.  Because skin is not sterile, your skin needs to be as free of germs as possible.  You can reduce the number of germs on your skin by washing with CHG (chlorahexidine gluconate) soap before surgery.  CHG is an antiseptic cleaner which kills germs and bonds with the skin to continue killing germs even after washing. Please DO NOT use if you have an allergy to CHG or antibacterial soaps.  If your skin becomes reddened/irritated stop using the CHG and inform your nurse when you arrive at Short  Stay. Do not shave (including legs and underarms) for at least 48 hours prior to the first CHG shower.  You may shave your face/neck. Please follow these instructions carefully:  1.  Shower with CHG Soap the night before surgery and the  morning of Surgery.  2.  If you choose to wash your hair, wash your hair first as usual with your  normal  shampoo.  3.  After you shampoo, rinse your hair and body thoroughly to remove the  shampoo.                           4.  Use CHG as you would any other liquid soap.  You can apply chg directly  to the skin and wash                       Gently with a scrungie or clean washcloth.  5.  Apply  the CHG Soap to your body ONLY FROM THE NECK DOWN.   Do not use on face/ open                           Wound or open sores. Avoid contact with eyes, ears mouth and genitals (private parts).                       Wash face,  Genitals (private parts) with your normal soap.             6.  Wash thoroughly, paying special attention to the area where your surgery  will be performed.  7.  Thoroughly rinse your body with warm water from the neck down.  8.  DO NOT shower/wash with your normal soap after using and rinsing off  the CHG Soap.                9.  Pat yourself dry with a clean towel.            10.  Wear clean pajamas.            11.  Place clean sheets on your bed the night of your first shower and do not  sleep with pets. Day of Surgery : Do not apply any lotions/deodorants the morning of surgery.  Please wear clean clothes to the hospital/surgery center.  FAILURE TO FOLLOW THESE INSTRUCTIONS MAY RESULT IN THE CANCELLATION OF YOUR SURGERY PATIENT SIGNATURE_________________________________  NURSE SIGNATURE__________________________________  ________________________________________________________________________    CLEAR LIQUID DIET   Foods Allowed                                                                     Foods Excluded  Coffee and tea,  regular and decaf                             liquids that you cannot  Plain Jell-O in any flavor                                             see through such as: Fruit ices (not with fruit pulp)                                     milk, soups, orange juice  Iced Popsicles                                    All solid food Carbonated beverages, regular and diet                                    Cranberry, grape and apple juices Sports drinks like Gatorade Lightly seasoned clear broth or consume(fat free) Sugar, honey syrup  Sample Menu Breakfast  Lunch                                     Supper Cranberry juice                    Beef broth                            Chicken broth Jell-O                                     Grape juice                           Apple juice Coffee or tea                        Jell-O                                      Popsicle                                                Coffee or tea                        Coffee or tea  _____________________________________________________________________

## 2014-11-27 ENCOUNTER — Inpatient Hospital Stay (HOSPITAL_COMMUNITY)
Admission: RE | Admit: 2014-11-27 | Discharge: 2014-11-30 | DRG: 621 | Disposition: A | Discharge status: Home / Self Care | Payer: BLUE CROSS/BLUE SHIELD | Source: Ambulatory Visit | Attending: Surgery | Admitting: Surgery

## 2014-11-27 ENCOUNTER — Inpatient Hospital Stay (HOSPITAL_COMMUNITY): Payer: BLUE CROSS/BLUE SHIELD | Admitting: Anesthesiology

## 2014-11-27 ENCOUNTER — Encounter (HOSPITAL_COMMUNITY)
Admission: RE | Disposition: A | Discharge status: Home / Self Care | Payer: Self-pay | Source: Ambulatory Visit | Attending: Surgery

## 2014-11-27 ENCOUNTER — Encounter (HOSPITAL_COMMUNITY): Payer: Self-pay | Admitting: *Deleted

## 2014-11-27 DIAGNOSIS — F419 Anxiety disorder, unspecified: Secondary | ICD-10-CM | POA: Diagnosis present

## 2014-11-27 DIAGNOSIS — Z800 Family history of malignant neoplasm of digestive organs: Secondary | ICD-10-CM

## 2014-11-27 DIAGNOSIS — Z8249 Family history of ischemic heart disease and other diseases of the circulatory system: Secondary | ICD-10-CM

## 2014-11-27 DIAGNOSIS — Z833 Family history of diabetes mellitus: Secondary | ICD-10-CM | POA: Diagnosis not present

## 2014-11-27 DIAGNOSIS — K219 Gastro-esophageal reflux disease without esophagitis: Secondary | ICD-10-CM | POA: Diagnosis present

## 2014-11-27 DIAGNOSIS — F329 Major depressive disorder, single episode, unspecified: Secondary | ICD-10-CM | POA: Diagnosis present

## 2014-11-27 DIAGNOSIS — Z01812 Encounter for preprocedural laboratory examination: Secondary | ICD-10-CM

## 2014-11-27 DIAGNOSIS — Z79899 Other long term (current) drug therapy: Secondary | ICD-10-CM | POA: Diagnosis not present

## 2014-11-27 DIAGNOSIS — R112 Nausea with vomiting, unspecified: Secondary | ICD-10-CM | POA: Diagnosis not present

## 2014-11-27 DIAGNOSIS — Z9884 Bariatric surgery status: Secondary | ICD-10-CM

## 2014-11-27 DIAGNOSIS — Z87891 Personal history of nicotine dependence: Secondary | ICD-10-CM | POA: Diagnosis not present

## 2014-11-27 DIAGNOSIS — Z6841 Body Mass Index (BMI) 40.0 and over, adult: Secondary | ICD-10-CM

## 2014-11-27 DIAGNOSIS — Z9889 Other specified postprocedural states: Secondary | ICD-10-CM | POA: Diagnosis not present

## 2014-11-27 DIAGNOSIS — R0689 Other abnormalities of breathing: Secondary | ICD-10-CM | POA: Diagnosis present

## 2014-11-27 HISTORY — PX: LAPAROSCOPIC GASTRIC SLEEVE RESECTION: SHX5895

## 2014-11-27 LAB — CBC
HCT: 44.7 % (ref 36.0–46.0)
Hemoglobin: 15.4 g/dL — ABNORMAL HIGH (ref 12.0–15.0)
MCH: 30.7 pg (ref 26.0–34.0)
MCHC: 34.5 g/dL (ref 30.0–36.0)
MCV: 89.2 fL (ref 78.0–100.0)
Platelets: 170 10*3/uL (ref 150–400)
RBC: 5.01 MIL/uL (ref 3.87–5.11)
RDW: 13.9 % (ref 11.5–15.5)
WBC: 15 10*3/uL — ABNORMAL HIGH (ref 4.0–10.5)

## 2014-11-27 LAB — CREATININE, SERUM
Creatinine, Ser: 1.05 mg/dL — ABNORMAL HIGH (ref 0.44–1.00)
GFR calc Af Amer: >60 mL/min (ref >60)
GFR calc non Af Amer: >60 mL/min (ref >60)

## 2014-11-27 LAB — PREGNANCY, URINE: Preg Test, Ur: NEGATIVE

## 2014-11-27 SURGERY — GASTRECTOMY, SLEEVE, LAPAROSCOPIC
Anesthesia: General | Site: Abdomen

## 2014-11-27 MED ORDER — ONDANSETRON HCL 4 MG/2ML IJ SOLN
INTRAMUSCULAR | Status: DC | PRN
  Administered 2014-11-27: 4 mg via INTRAVENOUS

## 2014-11-27 MED ORDER — DEXTROSE 5 % IV SOLN
INTRAVENOUS | Status: AC
  Filled 2014-11-27: qty 2

## 2014-11-27 MED ORDER — ALBUTEROL SULFATE (2.5 MG/3ML) 0.083% IN NEBU: 2.5000 mg | INHALATION_SOLUTION | Freq: Four times a day (QID) | RESPIRATORY_TRACT | Status: DC | PRN

## 2014-11-27 MED ORDER — ONDANSETRON HCL 4 MG/2ML IJ SOLN
INTRAMUSCULAR | Status: AC
Start: 2014-11-27 — End: 2014-11-27
  Filled 2014-11-27: qty 2

## 2014-11-27 MED ORDER — FENTANYL CITRATE (PF) 100 MCG/2ML IJ SOLN
25.0000 ug | INTRAMUSCULAR | Status: DC | PRN
  Administered 2014-11-27 (×2): 50 ug via INTRAVENOUS

## 2014-11-27 MED ORDER — LIDOCAINE HCL (CARDIAC) 20 MG/ML IV SOLN
INTRAVENOUS | Status: DC | PRN
  Administered 2014-11-27: 50 mg via INTRAVENOUS

## 2014-11-27 MED ORDER — FENTANYL CITRATE (PF) 250 MCG/5ML IJ SOLN
INTRAMUSCULAR | Status: AC
  Filled 2014-11-27: qty 25

## 2014-11-27 MED ORDER — BUPIVACAINE LIPOSOME 1.3 % IJ SUSP
20.0000 mL | Freq: Once | INTRAMUSCULAR | Status: AC
  Administered 2014-11-27: 20 mL
  Filled 2014-11-27: qty 20

## 2014-11-27 MED ORDER — FENTANYL CITRATE (PF) 100 MCG/2ML IJ SOLN
INTRAMUSCULAR | Status: DC | PRN
  Administered 2014-11-27: 50 ug via INTRAVENOUS
  Administered 2014-11-27: 100 ug via INTRAVENOUS
  Administered 2014-11-27 (×2): 50 ug via INTRAVENOUS

## 2014-11-27 MED ORDER — 0.9 % SODIUM CHLORIDE (POUR BTL) OPTIME
TOPICAL | Status: DC | PRN
  Administered 2014-11-27: 1000 mL

## 2014-11-27 MED ORDER — LIDOCAINE HCL (CARDIAC) 20 MG/ML IV SOLN
INTRAVENOUS | Status: AC
  Filled 2014-11-27: qty 5

## 2014-11-27 MED ORDER — SODIUM CHLORIDE 0.9 % IJ SOLN
INTRAMUSCULAR | Status: AC
  Filled 2014-11-27: qty 50

## 2014-11-27 MED ORDER — HYDROMORPHONE HCL 1 MG/ML IJ SOLN
INTRAMUSCULAR | Status: DC | PRN
  Administered 2014-11-27 (×2): 1 mg via INTRAVENOUS

## 2014-11-27 MED ORDER — HEPARIN SODIUM (PORCINE) 5000 UNIT/ML IJ SOLN
5000.0000 [IU] | Freq: Three times a day (TID) | INTRAMUSCULAR | Status: DC
Start: 2014-11-28 — End: 2014-11-30
  Administered 2014-11-28 – 2014-11-30 (×7): 5000 [IU] via SUBCUTANEOUS
  Filled 2014-11-27 (×10): qty 1

## 2014-11-27 MED ORDER — LABETALOL HCL 5 MG/ML IV SOLN
INTRAVENOUS | Status: AC
  Filled 2014-11-27: qty 4

## 2014-11-27 MED ORDER — CHLORHEXIDINE GLUCONATE CLOTH 2 % EX PADS: 6.0000 | MEDICATED_PAD | Freq: Once | CUTANEOUS | Status: DC

## 2014-11-27 MED ORDER — MIDAZOLAM HCL 5 MG/5ML IJ SOLN
INTRAMUSCULAR | Status: DC | PRN
  Administered 2014-11-27: 2 mg via INTRAVENOUS

## 2014-11-27 MED ORDER — PROMETHAZINE HCL 25 MG/ML IJ SOLN: 6.2500 mg | INTRAMUSCULAR | Status: DC | PRN

## 2014-11-27 MED ORDER — KCL IN DEXTROSE-NACL 20-5-0.45 MEQ/L-%-% IV SOLN
INTRAVENOUS | Status: DC
  Administered 2014-11-27 – 2014-11-28 (×2): 1000 mL via INTRAVENOUS
  Administered 2014-11-28: 100 mL/h via INTRAVENOUS
  Administered 2014-11-29 – 2014-11-30 (×2): 1000 mL via INTRAVENOUS
  Filled 2014-11-27 (×9): qty 1000

## 2014-11-27 MED ORDER — HYDROMORPHONE HCL 2 MG/ML IJ SOLN
INTRAMUSCULAR | Status: AC
  Filled 2014-11-27: qty 1

## 2014-11-27 MED ORDER — NEOSTIGMINE METHYLSULFATE 10 MG/10ML IV SOLN
INTRAVENOUS | Status: DC | PRN
  Administered 2014-11-27: 4 mg via INTRAVENOUS

## 2014-11-27 MED ORDER — CHLORHEXIDINE GLUCONATE CLOTH 2 % EX PADS
6.0000 | MEDICATED_PAD | Freq: Once | CUTANEOUS | Status: AC
  Administered 2014-11-27: 2 via TOPICAL

## 2014-11-27 MED ORDER — UNJURY CHOCOLATE CLASSIC POWDER
2.0000 [oz_av] | Freq: Four times a day (QID) | ORAL | Status: DC
  Administered 2014-11-30: 2 [oz_av] via ORAL

## 2014-11-27 MED ORDER — ROCURONIUM BROMIDE 100 MG/10ML IV SOLN
INTRAVENOUS | Status: DC | PRN
  Administered 2014-11-27: 5 mg via INTRAVENOUS
  Administered 2014-11-27: 10 mg via INTRAVENOUS
  Administered 2014-11-27: 20 mg via INTRAVENOUS
  Administered 2014-11-27: 45 mg via INTRAVENOUS
  Administered 2014-11-27 (×3): 10 mg via INTRAVENOUS

## 2014-11-27 MED ORDER — SODIUM CHLORIDE 0.9 % IJ SOLN
INTRAMUSCULAR | Status: DC | PRN
  Administered 2014-11-27: 20 mL

## 2014-11-27 MED ORDER — OXYCODONE HCL 5 MG/5ML PO SOLN
5.0000 mg | ORAL | Status: DC | PRN
  Administered 2014-11-29 (×2): 10 mg via ORAL
  Administered 2014-11-29: 5 mg via ORAL
  Administered 2014-11-29 – 2014-11-30 (×5): 10 mg via ORAL
  Filled 2014-11-27 (×5): qty 10
  Filled 2014-11-27: qty 5
  Filled 2014-11-27 (×2): qty 10

## 2014-11-27 MED ORDER — HEPARIN SODIUM (PORCINE) 5000 UNIT/ML IJ SOLN
5000.0000 [IU] | INTRAMUSCULAR | Status: AC
  Administered 2014-11-27: 5000 [IU] via SUBCUTANEOUS
  Filled 2014-11-27: qty 1

## 2014-11-27 MED ORDER — PROPOFOL 10 MG/ML IV BOLUS
INTRAVENOUS | Status: AC
  Filled 2014-11-27: qty 20

## 2014-11-27 MED ORDER — ACETAMINOPHEN 160 MG/5ML PO SOLN: 325.0000 mg | ORAL | Status: DC | PRN

## 2014-11-27 MED ORDER — SUCCINYLCHOLINE CHLORIDE 20 MG/ML IJ SOLN
INTRAMUSCULAR | Status: DC | PRN
  Administered 2014-11-27: 160 mg via INTRAVENOUS

## 2014-11-27 MED ORDER — DEXTROSE 5 % IV SOLN
2.0000 g | Freq: Once | INTRAVENOUS | Status: AC
  Administered 2014-11-28: 2 g via INTRAVENOUS
  Filled 2014-11-27: qty 2

## 2014-11-27 MED ORDER — ONDANSETRON HCL 4 MG/2ML IJ SOLN
4.0000 mg | INTRAMUSCULAR | Status: DC | PRN
  Administered 2014-11-28 (×4): 4 mg via INTRAVENOUS
  Filled 2014-11-27 (×4): qty 2

## 2014-11-27 MED ORDER — LABETALOL HCL 5 MG/ML IV SOLN
10.0000 mg | Freq: Once | INTRAVENOUS | Status: AC
  Administered 2014-11-27: 10 mg via INTRAVENOUS

## 2014-11-27 MED ORDER — MORPHINE SULFATE (PF) 2 MG/ML IV SOLN
2.0000 mg | INTRAVENOUS | Status: DC | PRN
  Administered 2014-11-27 – 2014-11-28 (×6): 2 mg via INTRAVENOUS
  Filled 2014-11-27 (×7): qty 1

## 2014-11-27 MED ORDER — FENTANYL CITRATE (PF) 100 MCG/2ML IJ SOLN
INTRAMUSCULAR | Status: AC
  Filled 2014-11-27: qty 2

## 2014-11-27 MED ORDER — ROCURONIUM BROMIDE 100 MG/10ML IV SOLN
INTRAVENOUS | Status: AC
  Filled 2014-11-27: qty 1

## 2014-11-27 MED ORDER — GLYCOPYRROLATE 0.2 MG/ML IJ SOLN
INTRAMUSCULAR | Status: AC
  Filled 2014-11-27: qty 3

## 2014-11-27 MED ORDER — DEXTROSE 5 % IV SOLN
2.0000 g | INTRAVENOUS | Status: AC
  Administered 2014-11-27 (×2): 2 g via INTRAVENOUS

## 2014-11-27 MED ORDER — PROPOFOL 10 MG/ML IV BOLUS
INTRAVENOUS | Status: DC | PRN
  Administered 2014-11-27: 200 mg via INTRAVENOUS

## 2014-11-27 MED ORDER — LABETALOL HCL 5 MG/ML IV SOLN
INTRAVENOUS | Status: DC | PRN
  Administered 2014-11-27: 5 mg via INTRAVENOUS

## 2014-11-27 MED ORDER — LACTATED RINGERS IV SOLN
INTRAVENOUS | Status: DC
  Administered 2014-11-27: 1000 mL via INTRAVENOUS
  Administered 2014-11-27: 15:00:00 via INTRAVENOUS

## 2014-11-27 MED ORDER — GLYCOPYRROLATE 0.2 MG/ML IJ SOLN
INTRAMUSCULAR | Status: DC | PRN
  Administered 2014-11-27: 0.6 mg via INTRAVENOUS

## 2014-11-27 MED ORDER — DEXAMETHASONE SODIUM PHOSPHATE 10 MG/ML IJ SOLN
INTRAMUSCULAR | Status: DC | PRN
  Administered 2014-11-27: 10 mg via INTRAVENOUS

## 2014-11-27 MED ORDER — PANTOPRAZOLE SODIUM 40 MG IV SOLR
40.0000 mg | Freq: Every day | INTRAVENOUS | Status: DC
  Administered 2014-11-27 – 2014-11-29 (×3): 40 mg via INTRAVENOUS
  Filled 2014-11-27 (×4): qty 40

## 2014-11-27 MED ORDER — ACETAMINOPHEN 160 MG/5ML PO SOLN: 650.0000 mg | ORAL | Status: DC | PRN

## 2014-11-27 MED ORDER — LACTATED RINGERS IR SOLN
Status: DC | PRN
  Administered 2014-11-27: 1000 mL

## 2014-11-27 MED ORDER — DEXAMETHASONE SODIUM PHOSPHATE 10 MG/ML IJ SOLN
INTRAMUSCULAR | Status: AC
  Filled 2014-11-27: qty 1

## 2014-11-27 MED ORDER — MIDAZOLAM HCL 2 MG/2ML IJ SOLN
INTRAMUSCULAR | Status: AC
  Filled 2014-11-27: qty 4

## 2014-11-27 MED ORDER — UNJURY CHICKEN SOUP POWDER: 2.0000 [oz_av] | Freq: Four times a day (QID) | ORAL | Status: DC

## 2014-11-27 MED ORDER — UNJURY VANILLA POWDER
2.0000 [oz_av] | Freq: Four times a day (QID) | ORAL | Status: DC
  Administered 2014-11-29 – 2014-11-30 (×4): 2 [oz_av] via ORAL

## 2014-11-27 SURGICAL SUPPLY — 67 items
APL SRG 32X5 SNPLK LF DISP (MISCELLANEOUS)
APPLICATOR COTTON TIP 6IN STRL (MISCELLANEOUS) IMPLANT
APPLIER CLIP 5 13 M/L LIGAMAX5 (MISCELLANEOUS)
APPLIER CLIP ROT 10 11.4 M/L (STAPLE)
APPLIER CLIP ROT 13.4 12 LRG (CLIP)
APR CLP LRG 13.4X12 ROT 20 MLT (CLIP)
APR CLP MED LRG 11.4X10 (STAPLE)
APR CLP MED LRG 5 ANG JAW (MISCELLANEOUS)
BLADE SURG 15 STRL LF DISP TIS (BLADE) ×1 IMPLANT
BLADE SURG 15 STRL SS (BLADE) ×2
CABLE HIGH FREQUENCY MONO STRZ (ELECTRODE) ×1 IMPLANT
CLIP APPLIE 5 13 M/L LIGAMAX5 (MISCELLANEOUS) IMPLANT
CLIP APPLIE ROT 10 11.4 M/L (STAPLE) IMPLANT
CLIP APPLIE ROT 13.4 12 LRG (CLIP) IMPLANT
COVER SURGICAL LIGHT HANDLE (MISCELLANEOUS) ×2 IMPLANT
DEVICE SUT QUICK LOAD TK 5 (STAPLE) ×1 IMPLANT
DEVICE SUT TI-KNOT TK 5X26 (MISCELLANEOUS) ×1 IMPLANT
DEVICE SUTURE ENDOST 10MM (ENDOMECHANICALS) ×1 IMPLANT
DEVICE TROCAR PUNCTURE CLOSURE (ENDOMECHANICALS) ×2 IMPLANT
DISSECTOR BLUNT TIP ENDO 5MM (MISCELLANEOUS) ×2 IMPLANT
DRAPE CAMERA CLOSED 9X96 (DRAPES) ×2 IMPLANT
ELECT REM PT RETURN 9FT ADLT (ELECTROSURGICAL) ×2
ELECTRODE REM PT RTRN 9FT ADLT (ELECTROSURGICAL) ×1 IMPLANT
GAUZE SPONGE 4X4 12PLY STRL (GAUZE/BANDAGES/DRESSINGS) IMPLANT
GLOVE BIOGEL M 8.0 STRL (GLOVE) ×2 IMPLANT
GOWN STRL REUS W/TWL XL LVL3 (GOWN DISPOSABLE) ×8 IMPLANT
HANDLE STAPLE EGIA 4 XL (STAPLE) ×2 IMPLANT
HOVERMATT SINGLE USE (MISCELLANEOUS) ×2 IMPLANT
KIT BASIN OR (CUSTOM PROCEDURE TRAY) ×2 IMPLANT
LIQUID BAND (GAUZE/BANDAGES/DRESSINGS) ×1 IMPLANT
NDL SPNL 22GX3.5 QUINCKE BK (NEEDLE) ×1 IMPLANT
NEEDLE SPNL 22GX3.5 QUINCKE BK (NEEDLE) ×2 IMPLANT
PACK UNIVERSAL I (CUSTOM PROCEDURE TRAY) ×2 IMPLANT
PEN SKIN MARKING BROAD (MISCELLANEOUS) ×2 IMPLANT
RELOAD STAPLE 45 PURP MED/THCK (STAPLE) IMPLANT
RELOAD TRI 45 ART MED THCK BLK (STAPLE) ×2 IMPLANT
RELOAD TRI 45 ART MED THCK PUR (STAPLE) IMPLANT
RELOAD TRI 60 ART MED THCK BLK (STAPLE) ×4 IMPLANT
RELOAD TRI 60 ART MED THCK PUR (STAPLE) ×3 IMPLANT
SCISSORS LAP 5X45 EPIX DISP (ENDOMECHANICALS) IMPLANT
SCRUB PCMX 4 OZ (MISCELLANEOUS) ×4 IMPLANT
SEALANT SURGICAL APPL DUAL CAN (MISCELLANEOUS) IMPLANT
SET IRRIG TUBING LAPAROSCOPIC (IRRIGATION / IRRIGATOR) ×2 IMPLANT
SHEARS CURVED HARMONIC AC 45CM (MISCELLANEOUS) ×2 IMPLANT
SLEEVE ADV FIXATION 5X100MM (TROCAR) ×4 IMPLANT
SLEEVE GASTRECTOMY 36FR VISIGI (MISCELLANEOUS) ×2 IMPLANT
SOLUTION ANTI FOG 6CC (MISCELLANEOUS) ×2 IMPLANT
SPONGE LAP 18X18 X RAY DECT (DISPOSABLE) ×2 IMPLANT
STAPLER VISISTAT 35W (STAPLE) ×2 IMPLANT
SUT SURGIDAC NAB ES-9 0 48 120 (SUTURE) ×1 IMPLANT
SUT VIC AB 4-0 SH 18 (SUTURE) ×2 IMPLANT
SUT VICRYL 0 TIES 12 18 (SUTURE) ×2 IMPLANT
SYR 10ML ECCENTRIC (SYRINGE) ×2 IMPLANT
SYR 20CC LL (SYRINGE) ×2 IMPLANT
SYR 50ML LL SCALE MARK (SYRINGE) ×2 IMPLANT
TOWEL OR 17X26 10 PK STRL BLUE (TOWEL DISPOSABLE) ×4 IMPLANT
TOWEL OR NON WOVEN STRL DISP B (DISPOSABLE) ×2 IMPLANT
TRAY FOLEY W/METER SILVER 14FR (SET/KITS/TRAYS/PACK) IMPLANT
TRAY FOLEY W/METER SILVER 16FR (SET/KITS/TRAYS/PACK) IMPLANT
TROCAR ADV FIXATION 12X100MM (TROCAR) ×2 IMPLANT
TROCAR ADV FIXATION 5X100MM (TROCAR) ×2 IMPLANT
TROCAR BLADELESS 15MM (ENDOMECHANICALS) ×2 IMPLANT
TROCAR BLADELESS OPT 5 100 (ENDOMECHANICALS) ×2 IMPLANT
TUBE CALIBRATION LAPBAND (TUBING) IMPLANT
TUBING CONNECTING 10 (TUBING) ×2 IMPLANT
TUBING ENDO SMARTCAP (MISCELLANEOUS) ×2 IMPLANT
TUBING FILTER THERMOFLATOR (ELECTROSURGICAL) ×2 IMPLANT

## 2014-11-27 NOTE — Op Note (Signed)
Name:  Millissa Deese MRN: [id number] Date of Surgery: 11/27/2014  Preop Diagnosis:  Morbid Obesity  Postop Diagnosis:  Morbid Obesity (Weight - 392, BMI - 61.4) , S/P Gastric Sleeve  Procedure:  Upper endoscopy  (Intraoperative)  Surgeon:  Ovidio Kin, M.D.  Anesthesia:  GET  Indications for procedure: [name] is a 44 y.o. female whose primary care physician is Nicki Reaper, NP and has completed a Gastric Sleeve today by Dr. Daphine Deutscher.  I am doing an intraoperative upper endoscopy to evaluate the gastric pouch.  Operative Note: The patient is under general anesthesia.  Dr. Daphine Deutscher is laparoscoping the patient while I do an upper endoscopy with a Pentax endoscope  to evaluate the stomach pouch.  With the patient intubated, I passed the Pentax upper endoscope without difficulty down the esophagus.  The esophago-gastric junction was at 39 cm.    The mucosa of the stomach looked viable and the staple line was intact without bleeding.  I advanced to the pylorus, but did not go through it.  While I insufflated the stomach pouch with air, Dr. Daphine Deutscher  flooded the upper abdomen with saline to put the gastric pouch under saline.  There was no bubbling or evidence of a leak.  Photos were taken of the gastric pouch.  There was no evidence of narrowing of the pouch and the gastric sleeve looked tubular.  The scope was then withdrawn.  The esophagus was unremarkable and the patient tolerated the endoscopy without difficulty.  Ovidio Kin, MD, [facility] Pager: [phone] Office phone:  [phone]

## 2014-11-27 NOTE — Interval H&P Note (Signed)
History and Physical Interval Note:  11/27/2014 1:45 PM  Alyssa Blanchard  has presented today for surgery, with the diagnosis of Morbid Obesity  The various methods of treatment have been discussed with the patient and family. After consideration of risks, benefits and other options for treatment, the patient has consented to  Procedure(s): LAPAROSCOPIC GASTRIC SLEEVE RESECTION (N/A) as a surgical intervention .  The patient's history has been reviewed, patient examined, no change in status, stable for surgery.  I have reviewed the patient's chart and labs.  Questions were answered to the patient's satisfaction.     Edwing Figley B

## 2014-11-27 NOTE — Anesthesia Procedure Notes (Signed)
Procedure Name: Intubation Date/Time: 11/27/2014 2:17 PM Performed by: Enriqueta Shutter D Pre-anesthesia Checklist: Patient identified, Emergency Drugs available, Suction available and Patient being monitored Patient Re-evaluated:Patient Re-evaluated prior to inductionOxygen Delivery Method: Circle System Utilized Preoxygenation: Pre-oxygenation with 100% oxygen Intubation Type: IV induction Ventilation: Mask ventilation without difficulty Laryngoscope Size: Mac and 4 Grade View: Grade II Tube type: Oral Tube size: 7.5 mm Number of attempts: 1 Airway Equipment and Method: Stylet and Oral airway Placement Confirmation: ETT inserted through vocal cords under direct vision,  positive ETCO2 and breath sounds checked- equal and bilateral Secured at: 21 cm Tube secured with: Tape Dental Injury: Teeth and Oropharynx as per pre-operative assessment

## 2014-11-27 NOTE — Op Note (Signed)
Surgeon: Wenda Low, MD, FACS  Asst:  Ovidio Kin, MD, FACS  Anes:  General endotracheal  Procedure: Laparoscopic sleeve gastrectomy and upper endoscopy  Diagnosis: Morbid obesity BMI > 60  Complications: none  EBL:   20 cc  Description of Procedure:  The patient was take to OR 1 and given general anesthesia.  The abdomen was prepped with PCMX and draped sterilely.  A timeout was performed.  Access to the abdomen was achieved with a 5 mm Optiview through the left upper quadrant.  Following insufflation, the state of the abdomen was found to be free of adhesions but marketly obese.  The calibration tube was inserted and pulled back and was borderline positive.  There was a large artery coursing through the gastrohepatic window that obscured the dissection from the right. I did not appreciate a hiatus hernia from the right side.   The ViSiGi 36Fr tube was inserted to deflate the stomach and was pulled back into the esophagus.    The pylorus was identified and we measured 5 cm back and marked the antrum.  At that point we began dissection to take down the greater curvature of the stomach using the Harmonic scalpel.  This dissection was taken all the way up to the left crus.  Posterior attachments of the stomach were also taken down.    The ViSiGi tube was then passed into the antrum and suction applied so that it was snug along the lessor curvature.  The "crow's foot" or incisura was identified.  The sleeve gastrectomy was begun using the [employer] stapler beginning with a 4.5 cm black Covidien with buttress.  This was continued with 6 cm black loads until the last load which was a purple with buttress.  When the sleeve was complete the tube was taken off suction and insufflated briefly.  The tube was withdrawn.  Upper endoscopy was then performed by Dr. Ezzard Standing and no bleeding or leaks were seen.  There was no evidence of a hiatus hernia..     The specimen was extracted through the 15  trocar site.  Wounds were infiltrated with Exparel and closed with 4-0 vicryl.    Matt B. Daphine Deutscher, MD, [facility], [place] [phone]

## 2014-11-27 NOTE — Anesthesia Preprocedure Evaluation (Addendum)
Anesthesia Evaluation  Patient identified by MRN, date of birth, ID band Patient awake    Reviewed: Allergy & Precautions, H&P , NPO status , Patient's Chart, lab work & pertinent test results  History of Anesthesia Complications Negative for: history of anesthetic complications  Airway Mallampati: II  TM Distance: >3 FB Neck ROM: full    Dental no notable dental hx.    Pulmonary asthma , former smoker,    Pulmonary exam normal breath sounds clear to auscultation       Cardiovascular negative cardio ROS Normal cardiovascular exam Rhythm:regular Rate:Normal     Neuro/Psych  Headaches, PSYCHIATRIC DISORDERS Anxiety Depression Takes antipsychotic medication abilify   GI/Hepatic negative GI ROS, Neg liver ROS,   Endo/Other  Morbid obesity  Renal/GU negative Renal ROS     Musculoskeletal   Abdominal   Peds  Hematology negative hematology ROS (+)   Anesthesia Other Findings   Reproductive/Obstetrics negative OB ROS                             Anesthesia Physical Anesthesia Plan  ASA: III  Anesthesia Plan: General   Post-op Pain Management:    Induction: Intravenous  Airway Management Planned: Oral ETT  Additional Equipment:   Intra-op Plan:   Post-operative Plan: Extubation in OR  Informed Consent: I have reviewed the patients History and Physical, chart, labs and discussed the procedure including the risks, benefits and alternatives for the proposed anesthesia with the patient or authorized representative who has indicated his/her understanding and acceptance.     Plan Discussed with: Anesthesiologist, CRNA and Surgeon  Anesthesia Plan Comments:        Anesthesia Quick Evaluation

## 2014-11-27 NOTE — Progress Notes (Signed)
Labetalol 10 mg IVP given as ordered by Dr. Mable Fill- made aware of patient;s heart rates 108-112- and blood pressures.

## 2014-11-27 NOTE — Transfer of Care (Signed)
Immediate Anesthesia Transfer of Care Note  Patient: Alyssa Blanchard  Procedure(s) Performed: Procedure(s): LAPAROSCOPIC GASTRIC SLEEVE RESECTION WITH HIATAL HERNIA REPAIR UPPER ENDOSCOPY (N/A)  Patient Location: PACU  Anesthesia Type:General  Level of Consciousness:  sedated, patient cooperative and responds to stimulation  Airway & Oxygen Therapy:Patient Spontanous Breathing and Patient connected to face mask oxgen  Post-op Assessment:  Report given to PACU RN and Post -op Vital signs reviewed and stable  Post vital signs:  Reviewed and stable  Last Vitals:  Filed Vitals:   11/27/14 1049  BP: 100/32  Pulse: 112  Temp: 37 C  Resp: 16    Complications: No apparent anesthesia complications

## 2014-11-27 NOTE — Anesthesia Postprocedure Evaluation (Signed)
  Anesthesia Post-op Note  Patient: Alyssa Blanchard  Procedure(s) Performed: Procedure(s) (LRB): LAPAROSCOPIC GASTRIC SLEEVE RESECTION WITH HIATAL HERNIA REPAIR UPPER ENDOSCOPY (N/A)  Patient Location: PACU  Anesthesia Type: General  Level of Consciousness: awake and alert   Airway and Oxygen Therapy: Patient Spontanous Breathing  Post-op Pain: mild  Post-op Assessment: Post-op Vital signs reviewed, Patient's Cardiovascular Status Stable, Respiratory Function Stable, Patent Airway and No signs of Nausea or vomiting  Last Vitals:  Filed Vitals:   11/27/14 1645  BP: 160/88  Pulse: 107  Temp:   Resp: 13    Post-op Vital Signs: stable   Complications: No apparent anesthesia complications

## 2014-11-27 NOTE — Plan of Care (Signed)
Problem: Phase I Progression Outcomes Goal: Initial discharge plan identified Outcome: Completed/Met Date Met:  11/27/14 Pt plans to go home with the assistance of her mother

## 2014-11-28 ENCOUNTER — Encounter (HOSPITAL_COMMUNITY): Payer: Self-pay | Admitting: Surgery

## 2014-11-28 ENCOUNTER — Inpatient Hospital Stay (HOSPITAL_COMMUNITY): Payer: BLUE CROSS/BLUE SHIELD

## 2014-11-28 DIAGNOSIS — Z9889 Other specified postprocedural states: Secondary | ICD-10-CM

## 2014-11-28 LAB — CBC WITH DIFFERENTIAL/PLATELET
Basophils Absolute: 0 10*3/uL (ref 0.0–0.1)
Basophils Relative: 0 %
Eosinophils Absolute: 0 10*3/uL (ref 0.0–0.7)
Eosinophils Relative: 0 %
HCT: 39.2 % (ref 36.0–46.0)
Hemoglobin: 13.1 g/dL (ref 12.0–15.0)
Lymphocytes Relative: 11 %
Lymphs Abs: 1.4 10*3/uL (ref 0.7–4.0)
MCH: 30.3 pg (ref 26.0–34.0)
MCHC: 33.4 g/dL (ref 30.0–36.0)
MCV: 90.5 fL (ref 78.0–100.0)
Monocytes Absolute: 1.1 10*3/uL — ABNORMAL HIGH (ref 0.1–1.0)
Monocytes Relative: 9 %
Neutro Abs: 10.5 10*3/uL — ABNORMAL HIGH (ref 1.7–7.7)
Neutrophils Relative %: 80 %
Platelets: 224 10*3/uL (ref 150–400)
RBC: 4.33 MIL/uL (ref 3.87–5.11)
RDW: 13.8 % (ref 11.5–15.5)
WBC: 13 10*3/uL — ABNORMAL HIGH (ref 4.0–10.5)

## 2014-11-28 LAB — HEMOGLOBIN AND HEMATOCRIT, BLOOD
HCT: 38.9 % (ref 36.0–46.0)
Hemoglobin: 13 g/dL (ref 12.0–15.0)

## 2014-11-28 MED ORDER — IOHEXOL 300 MG/ML  SOLN
50.0000 mL | Freq: Once | INTRAMUSCULAR | Status: DC | PRN
  Administered 2014-11-28: 50 mL via ORAL
  Filled 2014-11-28: qty 50

## 2014-11-28 MED ORDER — PROMETHAZINE HCL 25 MG/ML IJ SOLN
12.5000 mg | Freq: Four times a day (QID) | INTRAMUSCULAR | Status: DC | PRN
  Administered 2014-11-28 – 2014-11-30 (×7): 12.5 mg via INTRAVENOUS
  Filled 2014-11-28 (×7): qty 1

## 2014-11-28 NOTE — Progress Notes (Signed)
Patient alert and oriented, Post op day 1.  Provided support and encouragement.  Encouraged pulmonary toilet, ambulation and small sips of liquids when swallow study returned satisfactorily.  All questions answered.  Will continue to monitor. 

## 2014-11-28 NOTE — Progress Notes (Signed)
Pt continuing with nausea has vomited dark (old) blood x2. . zofran helps "only a little" . Dr Daphine Deutscher paged for further orders.

## 2014-11-28 NOTE — Progress Notes (Signed)
Patient ID: Alyssa Blanchard, female   DOB: 05-25-1970, 44 y.o.   MRN: [id number] [facility] Progress Note:   1 Day Post-Op  Subjective: Mental status is clear Objective: Vital signs in last 24 hours: Temp:  [98.4 F (36.9 C)-99.4 F (37.4 C)] 99.1 F (37.3 C) (09/20 0549) Pulse Rate:  [95-113] 105 (09/20 0549) Resp:  [12-19] 16 (09/20 0549) BP: (137-167)/(75-101) 138/75 mmHg (09/20 0549) SpO2:  [92 %-99 %] 92 % (09/20 0549) Weight:  [170.552 kg (376 lb)] 170.552 kg (376 lb) (09/19 1813)  Intake/Output from previous day: 09/19 0701 - 09/20 0700 In: 2510 [I.V.:2510] Out: 1150 [Urine:1150] Intake/Output this shift:    Physical Exam: Work of breathing is normal.  UGI OK.  Will start PD 1 diet  Lab Results:  Results for orders placed or performed during the hospital encounter of 11/27/14 (from the past 48 hour(s))  Pregnancy, urine STAT morning of surgery     Status: None   Collection Time: 11/27/14 11:00 AM  Result Value Ref Range   Preg Test, Ur NEGATIVE NEGATIVE    Comment:        THE SENSITIVITY OF THIS METHODOLOGY IS >20 mIU/mL.   CBC     Status: Abnormal   Collection Time: 11/27/14  5:45 PM  Result Value Ref Range   WBC 15.0 (H) 4.0 - 10.5 K/uL   RBC 5.01 3.87 - 5.11 MIL/uL   Hemoglobin 15.4 (H) 12.0 - 15.0 g/dL   HCT 44.7 36.0 - 46.0 %   MCV 89.2 78.0 - 100.0 fL   MCH 30.7 26.0 - 34.0 pg   MCHC 34.5 30.0 - 36.0 g/dL   RDW 13.9 11.5 - 15.5 %   Platelets 170 150 - 400 K/uL  Creatinine, serum     Status: Abnormal   Collection Time: 11/27/14  5:45 PM  Result Value Ref Range   Creatinine, Ser 1.05 (H) 0.44 - 1.00 mg/dL   GFR calc non Af Amer >60 >60 mL/min   GFR calc Af Amer >60 >60 mL/min    Comment: (NOTE) The eGFR has been calculated using the CKD EPI equation. This calculation has not been validated in all clinical situations. eGFR's persistently <60 mL/min signify possible Chronic Kidney Disease.   CBC WITH DIFFERENTIAL     Status: Abnormal   Collection Time: 11/28/14  5:44 AM  Result Value Ref Range   WBC 13.0 (H) 4.0 - 10.5 K/uL   RBC 4.33 3.87 - 5.11 MIL/uL   Hemoglobin 13.1 12.0 - 15.0 g/dL   HCT 39.2 36.0 - 46.0 %   MCV 90.5 78.0 - 100.0 fL   MCH 30.3 26.0 - 34.0 pg   MCHC 33.4 30.0 - 36.0 g/dL   RDW 13.8 11.5 - 15.5 %   Platelets 224 150 - 400 K/uL   Neutrophils Relative % 80 %   Neutro Abs 10.5 (H) 1.7 - 7.7 K/uL   Lymphocytes Relative 11 %   Lymphs Abs 1.4 0.7 - 4.0 K/uL   Monocytes Relative 9 %   Monocytes Absolute 1.1 (H) 0.1 - 1.0 K/uL   Eosinophils Relative 0 %   Eosinophils Absolute 0.0 0.0 - 0.7 K/uL   Basophils Relative 0 %   Basophils Absolute 0.0 0.0 - 0.1 K/uL    Radiology/Results: No results found.  Anti-infectives: Anti-infectives    Start     Dose/Rate Route Frequency Ordered Stop   11/28/14 0400  cefOXitin (MEFOXIN) 2 g in dextrose 5 % 50 mL IVPB  2 g 100 mL/hr over 30 Minutes Intravenous  Once 11/27/14 1826 11/28/14 0354   11/27/14 1057  cefOXitin (MEFOXIN) 2 g in dextrose 5 % 50 mL IVPB     2 g 100 mL/hr over 30 Minutes Intravenous On call to O.R. 11/27/14 1057 11/27/14 1650      Assessment/Plan: Problem List: Patient Active Problem List   Diagnosis Date Noted  . 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 Migraine without aura and without status migrainosus, not intractable 10/10/2014  . Migraine with aura and without status migrainosus, not intractable 08/29/2014  . 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 classified elsewhere 08/29/2014  . Hypoventilation associated with obesity syndrome 07/10/2014  . Snorings 07/10/2014  . Sleep related headaches 07/10/2014  . Nocturia more than twice per night 07/10/2014  . Preventative health care 07/30/2012  . Morbid obesity 07/30/2012    Swallow OK.  Start PD 1 diet 1 Day Post-Op    LOS: 1 day   Matt B. Hassell Done, MD, [facility] [phone] beeper [phone]  11/28/2014 11:00 AM

## 2014-11-28 NOTE — Care Management Note (Signed)
Case Management Note  Patient Details  Name: Alyssa Blanchard MRN: [id number] Date of Birth: 05-25-1970  Subjective/Objective:           Laparoscopic sleeve gastrectomy and upper endoscopy         Action/Plan: Discharge planning  Expected Discharge Date:  11/29/14               Expected Discharge Plan:  Home/Self Care  In-House Referral:  NA  Discharge planning Services  CM Consult  Post Acute Care Choice:  NA Choice offered to:  NA  DME Arranged:  N/A DME Agency:  NA  HH Arranged:  NA HH Agency:  NA  Status of Service:  Completed, signed off  Medicare Important Message Given:    Date Medicare IM Given:    Medicare IM give by:    Date Additional Medicare IM Given:    Additional Medicare Important Message give by:     If discussed at Long Length of Stay Meetings, dates discussed:    Additional Comments:  Alexis Goodell, RN 11/28/2014, 11:01 AM

## 2014-11-28 NOTE — Progress Notes (Signed)
*  PRELIMINARY RESULTS* Vascular Ultrasound Lower extremity venous duplex has been completed.  Preliminary findings: No obvious evidence of DVT or baker's cyst.   Farrel Demark, RDMS, RVT  11/28/2014, 8:50 AM

## 2014-11-29 LAB — CBC WITH DIFFERENTIAL/PLATELET
Basophils Absolute: 0 10*3/uL (ref 0.0–0.1)
Basophils Relative: 0 %
Eosinophils Absolute: 0 10*3/uL (ref 0.0–0.7)
Eosinophils Relative: 0 %
HCT: 38.8 % (ref 36.0–46.0)
Hemoglobin: 12.6 g/dL (ref 12.0–15.0)
Lymphocytes Relative: 21 %
Lymphs Abs: 2.1 10*3/uL (ref 0.7–4.0)
MCH: 30.1 pg (ref 26.0–34.0)
MCHC: 32.5 g/dL (ref 30.0–36.0)
MCV: 92.6 fL (ref 78.0–100.0)
Monocytes Absolute: 1.1 10*3/uL — ABNORMAL HIGH (ref 0.1–1.0)
Monocytes Relative: 11 %
Neutro Abs: 6.8 10*3/uL (ref 1.7–7.7)
Neutrophils Relative %: 68 %
Platelets: 175 10*3/uL (ref 150–400)
RBC: 4.19 MIL/uL (ref 3.87–5.11)
RDW: 14.2 % (ref 11.5–15.5)
WBC: 10 10*3/uL (ref 4.0–10.5)

## 2014-11-29 NOTE — Plan of Care (Signed)
Problem: Food- and Nutrition-Related Knowledge Deficit (NB-1.1) Goal: Nutrition education Formal process to instruct or train a patient/client in a skill or to impart knowledge to help patients/clients voluntarily manage or modify food choices and eating behavior to maintain or improve health. Outcome: Completed/Met Date Met:  11/29/14 Nutrition Education Note  Received consult for diet education per DROP protocol.   Discussed 2 week post op diet with pt. Emphasized that liquids must be non carbonated, non caffeinated, and sugar free. Fluid goals discussed. Pt to follow up with outpatient bariatric RD for further diet progression after 2 weeks. Multivitamins and minerals also reviewed. Teach back method used, pt expressed understanding, expect good compliance.   Diet: First 2 Weeks  You will see the nutritionist about two (2) weeks after your surgery. The nutritionist will increase the types of foods you can eat if you are handling liquids well:  If you have severe vomiting or nausea and cannot handle clear liquids lasting longer than 1 day, call your surgeon  Protein Shake  Drink at least 2 ounces of shake 5-6 times per day  Each serving of protein shakes (usually 8 - 12 ounces) should have a minimum of:  15 grams of protein  And no more than 5 grams of carbohydrate  Goal for protein each day:  Men = 80 grams per day  Women = 60 grams per day  Protein powder may be added to fluids such as non-fat milk or Lactaid milk or Soy milk (limit to 35 grams added protein powder per serving)   Hydration  Slowly increase the amount of water and other clear liquids as tolerated (See Acceptable Fluids)  Slowly increase the amount of protein shake as tolerated  Sip fluids slowly and throughout the day  May use sugar substitutes in small amounts (no more than 6 - 8 packets per day; i.e. Splenda)   Fluid Goal  The first goal is to drink at least 8 ounces of protein shake/drink per day (or as directed  by the nutritionist); some examples of protein shakes are [employer], [employer], EAS Edge HP, and Unjury. See handout from pre-op Bariatric Education Class:  Slowly increase the amount of protein shake you drink as tolerated  You may find it easier to slowly sip shakes throughout the day  It is important to get your proteins in first  Your fluid goal is to drink 64 - 100 ounces of fluid daily  It may take a few weeks to build up to this  32 oz (or more) should be clear liquids  And  32 oz (or more) should be full liquids (see below for examples)  Liquids should not contain sugar, caffeine, or carbonation   Clear Liquids:  Water or Sugar-free flavored water (i.e. Fruit H2O, Propel)  Decaffeinated coffee or tea (sugar-free)  Crystal Lite, Wyler's Lite, Minute Maid Lite  Sugar-free Jell-O  Bouillon or broth  Sugar-free Popsicle: *Less than 20 calories each; Limit 1 per day   Full Liquids:  Protein Shakes/Drinks + 2 choices per day of other full liquids  Full liquids must be:  No More Than 12 grams of Carbs per serving  No More Than 3 grams of Fat per serving  Strained low-fat cream soup  Non-Fat milk  Fat-free Lactaid Milk  Sugar-free yogurt (Dannon Lite & Fit, Greek yogurt)     Clayton Bibles, MS, RD, LDN Pager: [phone] After Hours Pager: [phone]

## 2014-11-29 NOTE — Progress Notes (Signed)
Patient ID: Alyssa Blanchard, female   DOB: November 25, 1970, 44 y.o.   MRN: [id number] [facility] Surgery Progress Note:   2 Days Post-Op  Subjective: Mental status is clear.  Phenergan necessary for nausea Objective: Vital signs in last 24 hours: Temp:  [98.2 F (36.8 C)-99.2 F (37.3 C)] 98.6 F (37 C) (09/21 1000) Pulse Rate:  [88-93] 88 (09/21 1000) Resp:  [17-18] 18 (09/21 1000) BP: (142-164)/(90-98) 144/90 mmHg (09/21 1000) SpO2:  [94 %-99 %] 99 % (09/21 1000) Weight:  [173.546 kg (382 lb 9.6 oz)] 173.546 kg (382 lb 9.6 oz) (09/21 0263)  Intake/Output from previous day: 09/20 0701 - 09/21 0700 In: 2700 [P.O.:300; I.V.:2400] Out: 2602 [Urine:2525; Emesis/NG output:77] Intake/Output this shift: Total I/O In: 400 [I.V.:400] Out: 400 [Urine:400]  Physical Exam: Work of breathing is normal.  Mainly nauseated  Lab Results:  Results for orders placed or performed during the hospital encounter of 11/27/14 (from the past 48 hour(s))  CBC     Status: Abnormal   Collection Time: 11/27/14  5:45 PM  Result Value Ref Range   WBC 15.0 (H) 4.0 - 10.5 K/uL   RBC 5.01 3.87 - 5.11 MIL/uL   Hemoglobin 15.4 (H) 12.0 - 15.0 g/dL   HCT 44.7 36.0 - 46.0 %   MCV 89.2 78.0 - 100.0 fL   MCH 30.7 26.0 - 34.0 pg   MCHC 34.5 30.0 - 36.0 g/dL   RDW 13.9 11.5 - 15.5 %   Platelets 170 150 - 400 K/uL  Creatinine, serum     Status: Abnormal   Collection Time: 11/27/14  5:45 PM  Result Value Ref Range   Creatinine, Ser 1.05 (H) 0.44 - 1.00 mg/dL   GFR calc non Af Amer >60 >60 mL/min   GFR calc Af Amer >60 >60 mL/min    Comment: (NOTE) The eGFR has been calculated using the CKD EPI equation. This calculation has not been validated in all clinical situations. eGFR's persistently <60 mL/min signify possible Chronic Kidney Disease.   CBC WITH DIFFERENTIAL     Status: Abnormal   Collection Time: 11/28/14  5:44 AM  Result Value Ref Range   WBC 13.0 (H) 4.0 - 10.5 K/uL   RBC 4.33 3.87 - 5.11 MIL/uL    Hemoglobin 13.1 12.0 - 15.0 g/dL   HCT 39.2 36.0 - 46.0 %   MCV 90.5 78.0 - 100.0 fL   MCH 30.3 26.0 - 34.0 pg   MCHC 33.4 30.0 - 36.0 g/dL   RDW 13.8 11.5 - 15.5 %   Platelets 224 150 - 400 K/uL   Neutrophils Relative % 80 %   Neutro Abs 10.5 (H) 1.7 - 7.7 K/uL   Lymphocytes Relative 11 %   Lymphs Abs 1.4 0.7 - 4.0 K/uL   Monocytes Relative 9 %   Monocytes Absolute 1.1 (H) 0.1 - 1.0 K/uL   Eosinophils Relative 0 %   Eosinophils Absolute 0.0 0.0 - 0.7 K/uL   Basophils Relative 0 %   Basophils Absolute 0.0 0.0 - 0.1 K/uL  Hemoglobin and hematocrit, blood     Status: None   Collection Time: 11/28/14  6:25 PM  Result Value Ref Range   Hemoglobin 13.0 12.0 - 15.0 g/dL   HCT 38.9 36.0 - 46.0 %  CBC with Differential     Status: Abnormal   Collection Time: 11/29/14  5:12 AM  Result Value Ref Range   WBC 10.0 4.0 - 10.5 K/uL   RBC 4.19 3.87 - 5.11 MIL/uL  Hemoglobin 12.6 12.0 - 15.0 g/dL   HCT 38.8 36.0 - 46.0 %   MCV 92.6 78.0 - 100.0 fL   MCH 30.1 26.0 - 34.0 pg   MCHC 32.5 30.0 - 36.0 g/dL   RDW 14.2 11.5 - 15.5 %   Platelets 175 150 - 400 K/uL   Neutrophils Relative % 68 %   Neutro Abs 6.8 1.7 - 7.7 K/uL   Lymphocytes Relative 21 %   Lymphs Abs 2.1 0.7 - 4.0 K/uL   Monocytes Relative 11 %   Monocytes Absolute 1.1 (H) 0.1 - 1.0 K/uL   Eosinophils Relative 0 %   Eosinophils Absolute 0.0 0.0 - 0.7 K/uL   Basophils Relative 0 %   Basophils Absolute 0.0 0.0 - 0.1 K/uL    Radiology/Results: Dg Ugi W/water Sol Cm  11/28/2014   CLINICAL DATA:  Postop day 1 status post laparoscopic sleeve gastrectomy  EXAM: WATER SOLUBLE UPPER GI SERIES  TECHNIQUE: Single-column upper GI series was performed using water soluble contrast.  CONTRAST:  40m OMNIPAQUE IOHEXOL 300 MG/ML  SOLN  COMPARISON:  09/25/2014  FLUOROSCOPY TIME:  Radiation Exposure Index (as provided by the fluoroscopic device):  If the device does not provide the exposure index:  Fluoroscopy Time (in minutes and seconds):  1  minutes, 7 seconds,  Number of Acquired Images:  0  FINDINGS: Initial KUB demonstrates postoperative findings in the left upper quadrant.  Expected tubular morphology of the stomach with passage of contrast into the duodenum. No leak of contrast along the gastric resection line is identified. No complicating feature observed.  IMPRESSION: 1. Status post sleeve gastrectomy, with no leak or complicating feature observed.   Electronically Signed   By: WVan ClinesM.D.   On: 11/28/2014 11:26    Anti-infectives: Anti-infectives    Start     Dose/Rate Route Frequency Ordered Stop   11/28/14 0400  cefOXitin (MEFOXIN) 2 g in dextrose 5 % 50 mL IVPB     2 g 100 mL/hr over 30 Minutes Intravenous  Once 11/27/14 1826 11/28/14 0354   11/27/14 1057  cefOXitin (MEFOXIN) 2 g in dextrose 5 % 50 mL IVPB     2 g 100 mL/hr over 30 Minutes Intravenous On call to O.R. 11/27/14 1057 11/27/14 1650      Assessment/Plan: Problem List: Patient Active Problem List   Diagnosis Date Noted  . 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 Migraine without aura and without status migrainosus, not intractable 10/10/2014  . Migraine with aura and without status migrainosus, not intractable 08/29/2014  . 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 classified elsewhere 08/29/2014  . Hypoventilation associated with obesity syndrome 07/10/2014  . Snorings 07/10/2014  . Sleep related headaches 07/10/2014  . Nocturia more than twice per night 07/10/2014  . Preventative health care 07/30/2012  . Morbid obesity 07/30/2012    Not taking enough PO for discharge.  Will observe in house for now.  2 Days Post-Op    LOS: 2 days   Matt B. MHassell Done MD, FCentinela [facility] [phone] 11/29/2014 1:05 PM

## 2014-11-29 NOTE — Progress Notes (Signed)
Patient alert and oriented, Post op day 2.  Provided support and encouragement.  Encouraged pulmonary toilet, ambulation and small sips of liquids.  All questions answered.  Will continue to monitor. Patient continues to be nauseated, vomiting protein shake x1.  Prior to discharge patient nausea needs to be better controlled, and tolerating protein shakes.

## 2014-11-30 MED ORDER — ACETAMINOPHEN 160 MG/5ML PO SOLN: 650.0000 mg | ORAL | Status: DC | PRN

## 2014-11-30 NOTE — Discharge Instructions (Signed)

## 2014-11-30 NOTE — Discharge Summary (Signed)
Physician Discharge Summary  Patient ID: Alyssa Blanchard MRN: [id number] DOB/AGE: [birth year] 44 y.o.  Admit date: 11/27/2014 Discharge date: 11/30/2014  Admission Diagnoses:  Morbid obesity  Discharge Diagnoses:  Morbid obesity-post sleeve gastrectomy  Active Problems:   S/P laparoscopic sleeve gastrectomy   Surgery:  Laparoscopic sleeve gastrectomy  Discharged Condition: improved  Hospital Course:   Had surgery.  Swallow on PD 1 was ok but she remained nauseated.  This delayed discharge until today.    Consults: none  Significant Diagnostic Studies: UGI    Discharge Exam: Blood pressure 138/88, pulse 78, temperature 98.2 F (36.8 C), temperature source Oral, resp. rate 18, height  (1.702 m), weight 172.5 kg (380 lb 4.7 oz), last menstrual period 11/05/2014, SpO2 95 %. Incisions OK  Disposition:   Discharge Instructions    Ambulate hourly while awake    Complete by:  As directed      Call MD for:  difficulty breathing, headache or visual disturbances    Complete by:  As directed      Call MD for:  persistant dizziness or light-headedness    Complete by:  As directed      Call MD for:  persistant nausea and vomiting    Complete by:  As directed      Call MD for:  redness, tenderness, or signs of infection (pain, swelling, redness, odor or green/yellow discharge around incision site)    Complete by:  As directed      Call MD for:  severe uncontrolled pain    Complete by:  As directed      Call MD for:  temperature >101 F    Complete by:  As directed      Diet bariatric full liquid    Complete by:  As directed      Incentive spirometry    Complete by:  As directed   Perform hourly while awake            Medication List    TAKE these medications        acetaminophen 500 MG tablet  Commonly known as:  TYLENOL  Take 1,000 mg by mouth every 6 (six) hours as needed for mild pain.     acetaminophen 160 MG/5ML solution  Commonly known as:  TYLENOL  Take 20.3  mLs (650 mg total) by mouth every 4 (four) hours as needed for mild pain or headache.     albuterol 108 (90 BASE) MCG/ACT inhaler  Commonly known as:  PROVENTIL HFA;VENTOLIN HFA  Inhale 2 puffs into the lungs every 6 (six) hours as needed.     ARIPiprazole 15 MG tablet  Commonly known as:  ABILIFY  Take 15 mg by mouth at bedtime.     buPROPion 300 MG 24 hr tablet  Commonly known as:  WELLBUTRIN XL  Take 300 mg by mouth every morning.  Notes to Patient:  This tablet cannot be crushed or cut in half, if it is too big to swallow it will need to be changed to an immediate release formula     famotidine 20 MG tablet  Commonly known as:  PEPCID  Take 20 mg by mouth 2 (two) times daily.     ketoprofen 50 MG capsule  Commonly known as:  ORUDIS  Take 1 capsule (50 mg total) by mouth every 6 (six) hours as needed. For headache     LATUDA 60 MG Tabs  Generic drug:  Lurasidone HCl  Take 1 tablet by mouth at  bedtime.     LORazepam 1 MG tablet  Commonly known as:  ATIVAN  Take 1 mg by mouth 2 (two) times daily.     meloxicam 15 MG tablet  Commonly known as:  MOBIC  TAKE 1 TABLET BY MOUTH EVERY DAY  Notes to Patient:  Avoid NSAIDs for 6-8 weeks after surgery      multivitamin with minerals Tabs tablet  Take 1 tablet by mouth daily.     pantoprazole 40 MG tablet  Commonly known as:  PROTONIX  Take 40 mg by mouth daily.     rOPINIRole 0.25 MG tablet  Commonly known as:  REQUIP  Take 1 tablet (0.25 mg total) by mouth 3 (three) times daily.     SUMAtriptan 100 MG tablet  Commonly known as:  IMITREX  ! Tab at onset of HA. May repeat in 2 hours if headache persists or recurs.No more than 4 doses a month.     topiramate 25 MG tablet  Commonly known as:  TOPAMAX  Take 1 tablet (25 mg total) by mouth 2 (two) times daily.     traZODone 150 MG tablet  Commonly known as:  DESYREL  Take 150 mg by mouth at bedtime.     VIACTIV MULTI-VITAMIN Chew  Chew 1 tablet by mouth daily.      vitamin B-12 500 MCG tablet  Commonly known as:  CYANOCOBALAMIN  Take 500 mcg by mouth daily.           Follow-up Information    Follow up with Valarie Merino, MD. Go on 12/07/2014.   Specialty:  General Surgery   Why:  For Post-Op Check at 9:30 AM   Contact information:   [address] [place] [address] [phone]       Signed: Valarie Merino 11/30/2014, 9:09 AM

## 2014-11-30 NOTE — Progress Notes (Signed)
Patient alert and oriented, pain is controlled. Patient is tolerating fluids,  advanced to protein shake yesterday . Reviewed Gastric Sleeve discharge instructions with patient and patient is able to articulate understanding. Provided information on BELT program, Support Group and [facility] outpatient pharmacy. All questions answered, will continue to monitor.

## 2014-12-04 ENCOUNTER — Telehealth (HOSPITAL_COMMUNITY): Payer: Self-pay

## 2014-12-04 NOTE — Telephone Encounter (Addendum)
  Attempted DROP discharge call, no answer, left message for patient to return call  Made discharge phone call to patient per DROP protocol. Asking the following questions.    1. Do you have someone to care for you now that you are home?  yes 2. Are you having pain now that is not relieved by your pain medication?  no 3. Are you able to drink the recommended daily amount of fluids (48 ounces minimum/day) and protein (60-80 grams/day) as prescribed by the dietitian or nutritional counselor?  yes 4. Are you taking the vitamins and minerals as prescribed?  yes 5. Do you have the "on call" number to contact your surgeon if you have a problem or question?  yes 6. Are your incisions free of redness, swelling or drainage? (If steri strips, address that these can fall off, shower as tolerated) yes 7. Have your bowels moved since your surgery?  If not, are you passing gas?  No, yes 8. Are you up and walking 3-4 times per day?  yes

## 2014-12-12 ENCOUNTER — Encounter: Payer: BLUE CROSS/BLUE SHIELD | Attending: Surgery

## 2014-12-12 DIAGNOSIS — Z713 Dietary counseling and surveillance: Secondary | ICD-10-CM | POA: Diagnosis not present

## 2014-12-12 DIAGNOSIS — Z6841 Body Mass Index (BMI) 40.0 and over, adult: Secondary | ICD-10-CM | POA: Insufficient documentation

## 2014-12-12 NOTE — Progress Notes (Signed)
Bariatric Class:  Appt start time: 1530 end time:  1630.  2 Week Post-Operative Nutrition Class  Patient was seen on 10/4 /2016 for Post[facility]ative Nutrition [facility].   Surgery date: 11/27/14 Surgery type: Sleeve gastrectomy Start weight at [facility]: 394 lbs on 10/09/14 Weight today: 370.0 lbs  Weight change:23 lbs   TANITA  BODY COMP RESULTS  10/30/14 12/12/14   BMI (kg/m^2) 61.6 58.0   Fat Mass (lbs) 221.5 214   Fat Free Mass (lbs) 171.5 156   Total Body Water (lbs) 125.5 114    The following the learning objectives were met by the patient during this course:  Identifies Phase 3A (Soft, High Proteins) Dietary Goals and will begin from 2 weeks post-operatively to 2 months post-operatively  Identifies appropriate sources of fluids and proteins   States protein recommendations and appropriate sources post-operatively  Identifies the need for appropriate texture modifications, mastication, and bite sizes when consuming solids  Identifies appropriate multivitamin and calcium sources post-operatively  Describes the need for physical activity post-operatively and will follow MD recommendations  States when to call healthcare provider regarding medication questions or post-operative complications  Handouts given during class include:  Phase 3A: Soft, High Protein Diet Handout  Follow-Up Plan: Patient will follow-up at [facility] in 6 weeks for 2 month post-op nutrition visit for diet advancement per MD.

## 2014-12-18 ENCOUNTER — Other Ambulatory Visit: Payer: Self-pay | Admitting: Podiatry

## 2015-01-23 ENCOUNTER — Encounter: Payer: BLUE CROSS/BLUE SHIELD | Attending: Surgery | Admitting: Dietician

## 2015-01-23 ENCOUNTER — Encounter: Payer: Self-pay | Admitting: Dietician

## 2015-01-23 DIAGNOSIS — Z6841 Body Mass Index (BMI) 40.0 and over, adult: Secondary | ICD-10-CM | POA: Diagnosis not present

## 2015-01-23 DIAGNOSIS — Z713 Dietary counseling and surveillance: Secondary | ICD-10-CM | POA: Insufficient documentation

## 2015-01-23 NOTE — Patient Instructions (Signed)
Goals:  Follow Phase 3B: High Protein + Non-Starchy Vegetables  Eat 3-6 small meals/snacks, every 3-5 hrs  Increase lean protein foods to meet 60g goal  Increase fluid intake to 64oz +  Avoid drinking 15 minutes before, during and 30 minutes after eating  Aim for >30 min of physical activity daily  Call Dr. Daphine DeutscherMartin office to let them about vomiting  Try Isopure or Atkins Lift if you want to clear protein shake  Surgery date: 11/27/14 Surgery type: Sleeve gastrectomy [facility] on 10/09/14 Weight today: 350.5 lbs  Weight change:19.5 lbs Total weight loss: 43.5 lbs  TANITA  BODY COMP RESULTS  10/30/14 12/12/14 01/23/15   BMI (kg/m^2) 61.6 58.0 54.9   Fat Mass (lbs) 221.5 214 200.5   Fat Free Mass (lbs) 171.5 156 150.0   Total Body Water (lbs) 125.5 114 110.0

## 2015-01-23 NOTE — Progress Notes (Signed)
  Follow-up visit:  8 Weeks Post-Operative Sleeve Gastrectomy Surgery  Medical Nutrition Therapy:  Appt start time: 0935 end time:  1000.  Primary concerns today: Post-operative Bariatric Surgery Nutrition Management. Returns with a 19.5 lb weight loss. Vomiting every other day. Stopped vomiting for about a week and recently started again. Has not found any trigger for vomiting and has a prescription (not sure which medication) and taking it every other day. Trying to chew well and take small bites. Cannot tolerate lunch meat, bacon, pepperoni, or Malawiturkey sausage.   Surgery date: 11/27/14 Surgery type: Sleeve gastrectomy [facility] lbs on 10/09/14 Weight today: 350.5 lbs  Weight change:19.5 lbs Total weight loss: 43.5 lbs  TANITA  BODY COMP RESULTS  10/30/14 12/12/14 01/23/15   BMI (kg/m^2) 61.6 58.0 54.9   Fat Mass (lbs) 221.5 214 200.5   Fat Free Mass (lbs) 171.5 156 150.0   Total Body Water (lbs) 125.5 114 110.0     Preferred Learning Style:   No preference indicated   Learning Readiness:   Ready  24-hr recall: B (AM): Inspire protein shake with skim milk (23-27 g) Snk (AM): none L (PM): 2.5 oz tuna or chicken  (17 g) Snk (PM): cheese stick (7 g)  D (PM): 3 oz ground Malaw[place]urkey and black beans with cheese with taco season (25 g) Snk (PM): SF popsicle   Fluid intake: 100 oz water  Estimated total protein intake: ~72 g  Medications:  See list Supplementation: taking  Using straws: every once in a while Drinking while eating: No, waiting 30 minutes to drink after eating most of the time Hair loss: no  Carbonated beverages: No N/V/D/C: vomiting every other day, no nausea for past 2 weeks Dumping syndrome: No  Recent physical activity:  Water aerobics 1 x week for 60 minutes  Progress Towards Goal(s):  In progress.  Handouts given during visit include:  Phase 3B High Protein + Non Starchy Vegetables   Nutritional Diagnosis:  [place]-3.3 Overweight/obesity  related to past poor dietary habits and physical inactivity as evidenced by patient w/ recent sleeve gastrectomy surgery following dietary guidelines for continued weight loss.    Intervention:  Nutrition education/diet advancement. Goals:  Follow Phase 3B: High Protein + Non-Starchy Vegetables  Eat 3-6 small meals/snacks, every 3-5 hrs  Increase lean protein foods to meet 60g goal  Increase fluid intake to 64oz +  Avoid drinking 15 minutes before, during and 30 minutes after eating  Aim for >30 min of physical activity daily  Call Dr. Daphine DeutscherMartin office to let them about vomiting  Try Isopure or Atkins Lift if you want to clear protein shake  Teaching Method Utilized:  Visual Auditory Hands on  Barriers to learning/adherence to lifestyle change: vomiting every other day  Demonstrated degree of understanding via:  Teach Back   Monitoring/Evaluation:  Dietary intake, exercise, and body weight. Follow up in 1 months for 3 month post-op visit.

## 2015-02-08 ENCOUNTER — Encounter: Payer: Self-pay | Admitting: Podiatry

## 2015-02-08 ENCOUNTER — Ambulatory Visit (INDEPENDENT_AMBULATORY_CARE_PROVIDER_SITE_OTHER): Payer: BLUE CROSS/BLUE SHIELD | Admitting: Podiatry

## 2015-02-08 VITALS — BP 137/81 | HR 64 | Resp 16

## 2015-02-08 DIAGNOSIS — M722 Plantar fascial fibromatosis: Secondary | ICD-10-CM

## 2015-02-08 MED ORDER — TRIAMCINOLONE ACETONIDE 10 MG/ML IJ SUSP
10.0000 mg | Freq: Once | INTRAMUSCULAR | Status: AC
  Administered 2015-02-08: 10 mg

## 2015-02-09 NOTE — Progress Notes (Signed)
Subjective:     Patient ID: Alyssa Blanchard, female   DOB: [birth year], 44 y.o.   MRN: [id number]  HPI patient states that she has had stomach surgery for weight loss and that she's trying to be active but her heels been bothering her a lot and making activity difficult   Review of Systems     Objective:   Physical Exam Neurovascular status found to be intact muscle strength is adequate with discomfort in the plantar heel region bilateral which is present bilateral with depression of the arch noted    Assessment:     Chronic plantar fasciitis bilateral with inflammation of the medial band    Plan:     Reinjected the plantar fascia bilateral 3 mg Kenalog 5 mg Xylocaine discussed physical therapy and supportive shoe gear usage and reappoint to recheck

## 2015-02-21 ENCOUNTER — Encounter: Payer: Self-pay | Admitting: Dietician

## 2015-02-21 ENCOUNTER — Encounter: Payer: BLUE CROSS/BLUE SHIELD | Attending: Surgery | Admitting: Dietician

## 2015-02-21 DIAGNOSIS — Z6841 Body Mass Index (BMI) 40.0 and over, adult: Secondary | ICD-10-CM | POA: Diagnosis not present

## 2015-02-21 DIAGNOSIS — Z713 Dietary counseling and surveillance: Secondary | ICD-10-CM | POA: Diagnosis not present

## 2015-02-21 NOTE — Progress Notes (Signed)
  Follow-up visit: 12 Weeks Post-Operative Sleeve Gastrectomy Surgery  Medical Nutrition Therapy:  Appt start time: 1120 end time:  1140  Primary concerns today: Post-operative Bariatric Surgery Nutrition Management. Returns with a 10.5 lb weight loss. Feeling much better now and no longer vomiting. Felt like she was eating too quickly.   Cannot tolerate lunch meat, bacon, pepperoni, or Malaw[place]key sausage.   Surgery date: 11/27/14 Surgery type: Sleeve gastrectomy [facility] lbs on 10/09/14 Weight today: 340 lbs  Weight change:10.5 lbs Total weight loss: 53.5 lbs Weight loss goal: under 200 lbs  TANITA  BODY COMP RESULTS  10/30/14 12/12/14 01/23/15 02/21/15   BMI (kg/m^2) 61.6 58.0 54.9 53.3   Fat Mass (lbs) 221.5 214 200.5 193.5   Fat Free Mass (lbs) 171.5 156 150.0 146.5   Total Body Water (lbs) 125.5 114 110.0 107.0     Preferred Learning Style:   No preference indicated   Learning Readiness:   Ready  24-hr recall: B (AM): Inspire protein shake with skim milk (23-27 g) Snk (AM): none or cheese stick (7 g) L (PM): 2.5 oz tuna or chicken with broccoli/cauliflower (17 g) Snk (PM): cheese stick (7 g)  D (PM): 4-5 oz protein and vegetables (28-35 g) Snk (PM): none or more dinner if she didn't finish it  Fluid intake: 100 oz water  Estimated total protein intake: 82+ g  Medications:  See list Supplementation: taking  Using straws: every once in a while Drinking while eating: No, waiting 30 minutes to drink after eating most of the time Hair loss: no  Carbonated beverages: No N/V/D/C: No Dumping syndrome: No  Recent physical activity:  Water aerobics 2 x week for 60 minutes  Progress Towards Goal(s):  In progress.  Handouts given during visit include:  Phase 3B High Protein + Non Starchy Vegetables   Nutritional Diagnosis:  [place]-3.3 Overweight/obesity related to past poor dietary habits and physical inactivity as evidenced by patient w/ recent sleeve  gastrectomy surgery following dietary guidelines for continued weight loss.    Intervention:  Nutrition education/diet reinforcement Goals:  Follow Phase 3B: High Protein + Non-Starchy Vegetables  Eat 3-6 small meals/snacks, every 3-5 hrs  Increase lean protein foods to meet 60g goal  Increase fluid intake to 64oz +  Avoid drinking 15 minutes before, during and 30 minutes after eating  Aim for >30 min of physical activity daily  If you craving fruit, have it with protein and vegetables  Teaching Method Utilized:  Visual Auditory Hands on  Barriers to learning/adherence to lifestyle change: vomiting every other day  Demonstrated degree of understanding via:  Teach Back   Monitoring/Evaluation:  Dietary intake, exercise, and body weight. Follow up in 3 months for 6 month post-op visit.

## 2015-02-21 NOTE — Patient Instructions (Addendum)
Goals:  Follow Phase 3B: High Protein + Non-Starchy Vegetables  Eat 3-6 small meals/snacks, every 3-5 hrs  Increase lean protein foods to meet 60g goal  Increase fluid intake to 64oz +  Avoid drinking 15 minutes before, during and 30 minutes after eating  Aim for >30 min of physical activity daily  If you craving fruit, have it with protein and vegetables  Surgery date: 11/27/14 Surgery type: [facility]leeve gastrectomy [facility] on 10/09/14 Weight today: 340 lbs  Weight change:10.5 lbs Total weight loss: 53.5 lbs  TANITA  BODY COMP RESULTS  10/30/14 12/12/14 01/23/15 02/21/15   BMI (kg/m^2) 61.6 58.0 54.9 53.3   Fat Mass (lbs) 221.5 214 200.5 193.5   Fat Free Mass (lbs) 171.5 156 150.0 146.5   Total Body Water (lbs) 125.5 114 110.0 107.0

## 2015-05-14 ENCOUNTER — Ambulatory Visit (INDEPENDENT_AMBULATORY_CARE_PROVIDER_SITE_OTHER): Payer: BLUE CROSS/BLUE SHIELD | Admitting: Podiatry

## 2015-05-14 ENCOUNTER — Encounter: Payer: Self-pay | Admitting: Podiatry

## 2015-05-14 VITALS — BP 122/72 | HR 73 | Resp 16

## 2015-05-14 DIAGNOSIS — M722 Plantar fascial fibromatosis: Secondary | ICD-10-CM

## 2015-05-14 MED ORDER — TRIAMCINOLONE ACETONIDE 10 MG/ML IJ SUSP
10.0000 mg | Freq: Once | INTRAMUSCULAR | Status: AC
  Administered 2015-05-14: 10 mg

## 2015-05-16 NOTE — Progress Notes (Signed)
Subjective:     Patient ID: Alyssa Blanchard, female   DOB: 10-19-1970, 45 y.o.   MRN: [id number]  HPI patient presents stating that she's getting a lot of pain in her heels again and that injections have been very helpful   Review of Systems     Objective:   Physical Exam Neurovascular status unchanged with exquisite discomfort plantar aspect heel region bilateral with fluid buildup around the medial fascial bands    Assessment:     Continued acute plantar fasciitis bilateral    Plan:     Instructed on physical therapy and reinjected the plantar fascia bilateral 3 mg Kenalog 5 mg Xylocaine

## 2015-05-23 ENCOUNTER — Encounter: Payer: BLUE CROSS/BLUE SHIELD | Attending: Surgery | Admitting: Dietician

## 2015-05-23 ENCOUNTER — Encounter: Payer: Self-pay | Admitting: Dietician

## 2015-05-23 DIAGNOSIS — Z6841 Body Mass Index (BMI) 40.0 and over, adult: Secondary | ICD-10-CM | POA: Insufficient documentation

## 2015-05-23 DIAGNOSIS — Z713 Dietary counseling and surveillance: Secondary | ICD-10-CM | POA: Insufficient documentation

## 2015-05-23 NOTE — Patient Instructions (Addendum)
Goals:  Follow Phase 3B: High Protein + Non-Starchy Vegetables  Eat 3-6 small meals/snacks, every 3-5 hrs  Increase lean protein foods to meet 60g goal  Make sure to have protein with breakfast  Increase fluid intake to 64oz +  Avoid drinking 15 minutes before, during and 30 minutes after eating  Aim for >30 min of physical activity daily  If you craving fruit, have it with protein and vegetables  Eat fruit only 1x a day  Surgery date: 11/27/14 Surgery type: Sleeve gastrectomy [facility] lbs on 10/09/14 Weight today: 313.5 lbs Weight change: 26.5 lbs Total weight loss: 80.5 lbs Weight loss goal: under 200 lbs  TANITA  BODY COMP RESULTS  10/30/14 12/12/14 01/23/15 02/21/15 05/23/15   BMI (kg/m^2) 61.6 58.0 54.9 53.3 49.1   Fat Mass (lbs) 221.5 214 200.5 193.5 168.5   Fat Free Mass (lbs) 171.5 156 150.0 146.5 145   Total Body Water (lbs) 125.5 114 110.0 107.0 106

## 2015-05-23 NOTE — Progress Notes (Signed)
  Follow-up visit: 6 months Post-Operative Sleeve Gastrectomy Surgery  Medical Nutrition Therapy:  Appt start time: 1120 end time:  1140  Primary concerns today: Post-operative Bariatric Surgery Nutrition Management.  Darcia returns having lost another 26.5 lbs. She reports she had vomiting several times one day last week. Feels like she was chewing well enough. Other than that she usually vomits about 1x a week. Still cannot tolerate lunch meat. Tolerates veggies well.    Surgery date: 11/27/14 Surgery type: [facility]lee[facility] gastrectomy [facility] on 10/09/14 Weight today: 313.5 lbs Weight change: 26.5 lbs Total weight loss: 80.5 lbs Weight loss goal: under 200 lbs  TANITA  BODY COMP RESULTS  10/30/14 12/12/14 01/23/15 02/21/15 05/23/15   BMI (kg/m^2) 61.6 58.0 54.9 53.3 49.1   Fat Mass (lbs) 221.5 214 200.5 193.5 168.5   Fat Free Mass (lbs) 171.5 156 150.0 146.5 145   Total Body Water (lbs) 125.5 114 110.0 107.0 106     Preferred Learning Style:   No preference indicated   Learning Readiness:   Ready  24-hr recall: B (AM): Premier protein shake or a banana (0-30 g) Snk (AM): none L (PM): 2.5 oz tuna with cheese stick (23 g) Snk (PM): cheese stick and pepperoni (10 g)  D (PM): 4-5 oz protein (chicken, fish, ground [place]key) and vegetables (28-35 g) Snk (PM): sometimes an apple  Fluid intake: over 100 oz water  Estimated total protein intake: 82+ g  Medications:  See list Supplementation: taking  Using straws: every once in a while Drinking while eating: sometimes drinks right after eating and notices it causes her to be able to eat more Hair loss: yes Carbonated beverages: No N/V/D/C: some vomiting Dumping syndrome: No  Recent physical activity:  BELT 3x a week, personal training 3x a week, swimming 3x a week (5-6 days a week total)  Progress Towards Goal(s):  In progress.  Handouts given during visit include:  none   Nutritional Diagnosis:  [place]-3.3  Overweight/obesity related to past poor dietary habits and physical inactivity as evidenced by patient w/ recent sleeve gastrectomy surgery following dietary guidelines for continued weight loss.    Intervention:  Nutrition education/diet reinforcement   Teaching Method Utilized:  Visual Auditory Hands on  Barriers to learning/adherence to lifestyle change: vomiting every other day  Demonstrated degree of understanding via:  Teach Back   Monitoring/Evaluation:  Dietary intake, exercise, and body weight. Follow up in 6 months for 9 month post-op visit.

## 2015-07-21 ENCOUNTER — Emergency Department (HOSPITAL_COMMUNITY)
Admission: EM | Admit: 2015-07-21 | Discharge: 2015-07-21 | Disposition: A | Discharge status: Home / Self Care | Payer: BLUE CROSS/BLUE SHIELD | Attending: Emergency Medicine | Admitting: Emergency Medicine

## 2015-07-21 ENCOUNTER — Encounter (HOSPITAL_COMMUNITY): Payer: Self-pay | Admitting: Emergency Medicine

## 2015-07-21 DIAGNOSIS — J45909 Unspecified asthma, uncomplicated: Secondary | ICD-10-CM | POA: Diagnosis not present

## 2015-07-21 DIAGNOSIS — Z79899 Other long term (current) drug therapy: Secondary | ICD-10-CM | POA: Insufficient documentation

## 2015-07-21 DIAGNOSIS — F329 Major depressive disorder, single episode, unspecified: Secondary | ICD-10-CM | POA: Diagnosis not present

## 2015-07-21 DIAGNOSIS — Z87891 Personal history of nicotine dependence: Secondary | ICD-10-CM | POA: Diagnosis not present

## 2015-07-21 DIAGNOSIS — R002 Palpitations: Secondary | ICD-10-CM | POA: Diagnosis present

## 2015-07-21 DIAGNOSIS — Z7951 Long term (current) use of inhaled steroids: Secondary | ICD-10-CM | POA: Insufficient documentation

## 2015-07-21 DIAGNOSIS — Z791 Long term (current) use of non-steroidal anti-inflammatories (NSAID): Secondary | ICD-10-CM | POA: Diagnosis not present

## 2015-07-21 DIAGNOSIS — F1410 Cocaine abuse, uncomplicated: Secondary | ICD-10-CM | POA: Diagnosis not present

## 2015-07-21 DIAGNOSIS — K219 Gastro-esophageal reflux disease without esophagitis: Secondary | ICD-10-CM | POA: Insufficient documentation

## 2015-07-21 NOTE — ED Provider Notes (Signed)
CSN: [id number]     Arrival date & time 07/21/15  4098 History   First MD Initiated Contact with Patient 07/21/15 1006     Chief Complaint  Patient presents with  . Palpitations     (Consider location/radiation/quality/duration/timing/severity/associated sxs/prior Treatment) HPI   Alyssa Blanchard is a 45 y.o. female who presents for evaluation of intermittent palpitations, which tend to occur when she uses cocaine or within a couple hours of use. He has been using it multiple times each day, for the last month. She denies chest pain, weakness, dizziness, nausea, vomiting, fever, chills, cough. There are no other known modifying factors. She plans on "going into rehabilitation, " this Thursday.   Past Medical History  Diagnosis Date  . Depression   . GERD (gastroesophageal reflux disease)   . Anxiety   . Asthma   . Migraines   . History of chicken pox   . STD (sexually transmitted disease)     chl hx & hsv 1&2  . History of bronchitis   . History of urinary tract infection   . Urinary incontinence   . Tremors of nervous system   . Plantar fasciitis     bilat  . Heel spur     bilat   Past Surgical History  Procedure Laterality Date  . Tonsillectomy  1978  . 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 Diabetes Mother   . Hypertension Mother   . Hyperlipidemia Mother   . Kidney disease Mother   . Cancer Father     pancreatic  . Hypertension Maternal Grandmother   . Diabetes Maternal Grandmother   . Heart failure Maternal Grandmother     CHF  . Heart attack Maternal Grandfather   . Hypertension Maternal Grandfather   . Hypertension Paternal Grandmother   . Alzheimer's disease Paternal Grandmother   . Hypertension Paternal Grandfather    . Cancer Maternal Uncle     melanoma  . Cancer Paternal Uncle     kidney   Social History  Substance Use Topics  . Smoking status: Former Smoker -- 0.25 packs/day for 5 years    Types: Cigarettes    Quit date: 03/10/2012  . Smokeless tobacco: Never Used  . Alcohol Use: 0.0 oz/week    0 Standard drinks or equivalent per week     Comment: 1 a month   OB History    Gravida Para Term Preterm AB TAB SAB Ectopic Multiple Living   2    2 2     0     Review of Systems  All other systems reviewed and are negative.     Allergies  Review of patient's allergies indicates no known allergies.  Home Medications   Prior to Admission medications   Medication Sig Start Date End Date Taking? Authorizing Provider  acetaminophen (TYLENOL) 160 MG/5ML solution Take 20.3 mLs (650 mg total) by mouth every 4 (four) hours as needed for mild pain or headache. 11/30/14   Luretha Murphy, MD  acetaminophen (TYLENOL) 500 MG tablet Take 1,000 mg by mouth every 6 (six) hours as needed for mild pain.    Historical Provider, MD  albuterol (PROVENTIL HFA;VENTOLIN HFA) 108 (90 BASE) MCG/ACT inhaler Inhale 2 puffs into the lungs every 6 (six) hours as needed. 07/09/13  Elvina SidleKurt Lauenstein, MD  ARIPiprazole (ABILIFY) 15 MG tablet Take 20 mg by mouth at bedtime.     Historical Provider, MD  buPROPion (WELLBUTRIN XL) 300 MG 24 hr tablet Take 300 mg by mouth every morning.  08/09/14   Historical Provider, MD  famotidine (PEPCID) 20 MG tablet Take 20 mg by mouth 2 (two) times daily.    Historical Provider, MD  ketoprofen (ORUDIS) 50 MG capsule Take 1 capsule (50 mg total) by mouth every 6 (six) hours as needed. For headache 10/10/14   Melvyn Novasarmen Dohmeier, MD  LORazepam (ATIVAN) 1 MG tablet Take 1 mg by mouth 2 (two) times daily.  08/10/14   Historical Provider, MD  Lurasidone HCl (LATUDA) 60 MG TABS Take 1 tablet by mouth at bedtime. Reported on 05/23/2015    Historical Provider, MD  meloxicam (MOBIC) 15 MG tablet TAKE 1 TABLET BY  MOUTH EVERY DAY 09/26/14   Lenn SinkNorman S Regal, DPM  Multiple Vitamin (MULTIVITAMIN WITH MINERALS) TABS tablet Take 1 tablet by mouth daily.    Historical Provider, MD  Multiple Vitamins-Calcium (VIACTIV MULTI-VITAMIN) CHEW Chew 1 tablet by mouth daily.    Historical Provider, MD  pantoprazole (PROTONIX) 40 MG tablet Take 40 mg by mouth daily.    Historical Provider, MD  rOPINIRole (REQUIP) 0.25 MG tablet Take 1 tablet (0.25 mg total) by mouth 3 (three) times daily. 10/10/14   Porfirio Mylararmen Dohmeier, MD  SUMAtriptan (IMITREX) 100 MG tablet ! Tab at onset of HA. May repeat in 2 hours if headache persists or recurs.No more than 4 doses a month. 08/29/14   Tonye Pearsonobert P Doolittle, MD  topiramate (TOPAMAX) 25 MG tablet Take 1 tablet (25 mg total) by mouth 2 (two) times daily. 08/31/14   Porfirio Mylararmen Dohmeier, MD  traZODone (DESYREL) 150 MG tablet Take 150 mg by mouth at bedtime.    Historical Provider, MD  vitamin B-12 (CYANOCOBALAMIN) 500 MCG tablet Take 500 mcg by mouth daily.    Historical Provider, MD   BP 126/66 mmHg  Pulse 67  Temp(Src) 98.5 F (36.9 C) (Oral)  Resp 15  SpO2 96% Physical Exam  Constitutional: She is oriented to person, place, and time. She appears well-developed and well-nourished. No distress.  HENT:  Head: Normocephalic and atraumatic.  Right Ear: External ear normal.  Left Ear: External ear normal.  Eyes: Conjunctivae and EOM are normal. Pupils are equal, round, and reactive to light.  Neck: Normal range of motion and phonation normal. Neck supple.  Cardiovascular: Normal rate, regular rhythm and normal heart sounds.   Pulmonary/Chest: Effort normal and breath sounds normal. She exhibits no bony tenderness.  Abdominal: Soft. There is no tenderness.  Musculoskeletal: Normal range of motion.  Neurological: She is alert and oriented to person, place, and time. No cranial nerve deficit or sensory deficit. She exhibits normal muscle tone. Coordination normal.  Skin: Skin is warm, dry and intact.   Psychiatric: She has a normal mood and affect. Her behavior is normal. Judgment and thought content normal.  Nursing note and vitals reviewed.   ED Course  Procedures (including critical care time)  Initial clinical impression- palpitations coincidental with use of cocaine, which is a cardiac stimulant medication. Symptoms are intermittent and did not have any associated symptoms of concern.  Medications - No data to display  Patient Vitals for the past 24 hrs:  BP Temp Temp src Pulse Resp SpO2  07/21/15 0958 126/66 mmHg 98.5 F (36.9 C) Oral 67 15 96 %  07/21/15 0915 121/75 mmHg 97.9  F (36.6 C) Oral 74 16 97 %    10:38 AM Reevaluation with update and discussion. After initial assessment and treatment, an updated evaluation reveals No further complaints. Findings discussed with the patient, all questions answered. Ermie Glendenning L    Labs Review Labs Reviewed - No data to display  Imaging Review No results found. I have personally reviewed and evaluated these images and lab results as part of my medical decision-making.   EKG Interpretation   Date/Time:  Saturday Jul 21 2015 09:10:57 EDT Ventricular Rate:  81 PR Interval:  144 QRS Duration: 92 QT Interval:  397 QTC Calculation: 461 R Axis:   16 Text Interpretation:  Sinus rhythm Low voltage, precordial leads since  last tracing no significant change Confirmed by Effie Shy  MD, Mechele Collin ([id number])  on 07/21/2015 10:16:10 AM      MDM   Final diagnoses:  Palpitations  Cocaine abuse    Cocaine abuse, with palpitations. Doubt ACS, PE, pneumonia, or impending vascular collapse.  Nursing Notes Reviewed/ Care Coordinated Applicable Imaging Reviewed Interpretation of Laboratory Data incorporated into ED treatment  The patient appears reasonably screened and/or stabilized for discharge and I doubt any other medical condition or other [facility] requiring further screening, evaluation, or treatment in the ED at this time prior to  discharge.  Plan: Home Medications- none; Home Treatments- stop cocaine; return here if the recommended treatment, does not improve the symptoms; Recommended follow up- PCP prn     Mancel Bale, MD 07/21/15 1039

## 2015-07-21 NOTE — ED Notes (Signed)
Her skin is normal, warm and dry and she is breathing normally.   She tells me she has been "using a lot of cocaine lately--I don't know why I do it".  She states she has a set date (this coming Thursday) to "check in to [facility]." She states she has hx of panic attacks; and that the palpitations and the feeling of her "throat getting tight" are both "gone now".

## 2015-07-21 NOTE — ED Notes (Addendum)
Pt reports palpitations since using cocaine last night. Pt also reports feeling that her throat is closing. Last use cocaine 0200 last night. EKG normal in triage. Pt speaking in full sentences. Also complaining of HA.

## 2015-07-21 NOTE — Discharge Instructions (Signed)
Avoid using cocaine.   Palpitations A palpitation is the feeling that your heartbeat is irregular or is faster than normal. It may feel like your heart is fluttering or skipping a beat. Palpitations are usually not a serious problem. However, in some cases, you may need further medical evaluation. CAUSES  Palpitations can be caused by:  Smoking.  Caffeine or other stimulants, such as diet pills or energy drinks.  Alcohol.  Stress and anxiety.  Strenuous physical activity.  Fatigue.  Certain medicines.  Heart disease, especially if you have a history of irregular heart rhythms (arrhythmias), such as atrial fibrillation, atrial flutter, or supraventricular tachycardia.  An improperly working pacemaker or defibrillator. DIAGNOSIS  To find the cause of your palpitations, your health care provider will take your medical history and perform a physical exam. Your health care provider may also have you take a test called an ambulatory electrocardiogram (ECG). An ECG records your heartbeat patterns over a 24-hour period. You may also have other tests, such as:  Transthoracic echocardiogram (TTE). During echocardiography, sound waves are used to evaluate how blood flows through your heart.  Transesophageal echocardiogram (TEE).  Cardiac monitoring. This allows your health care provider to monitor your heart rate and rhythm in real time.  Holter monitor. This is a portable device that records your heartbeat and can help diagnose heart arrhythmias. It allows your health care provider to track your heart activity for several days, if needed.  Stress tests by exercise or by giving medicine that makes the heart beat faster. TREATMENT  Treatment of palpitations depends on the cause of your symptoms and can vary greatly. Most cases of palpitations do not require any treatment other than time, relaxation, and monitoring your symptoms. Other causes, such as atrial fibrillation, atrial flutter, or  supraventricular tachycardia, usually require further treatment. HOME CARE INSTRUCTIONS   Avoid:  Caffeinated coffee, tea, soft drinks, diet pills, and energy drinks.  Chocolate.  Alcohol.  Stop smoking if you smoke.  Reduce your stress and anxiety. Things that can help you relax include:  A method of controlling things in your body, such as your heartbeats, with your mind (biofeedback).  Yoga.  Meditation.  Physical activity such as swimming, jogging, or walking.  Get plenty of rest and sleep. SEEK MEDICAL CARE IF:   You continue to have a fast or irregular heartbeat beyond 24 hours.  Your palpitations occur more often. SEEK IMMEDIATE MEDICAL CARE IF:  You have chest pain or shortness of breath.  You have a severe headache.  You feel dizzy or you faint. MAKE SURE YOU:  Understand these instructions.  Will watch your condition.  Will get help right away if you are not doing well or get worse.   This information is not intended to replace advice given to you by your health care provider. Make sure you discuss any questions you have with your health care provider.   Document Released: 02/22/2000 Document Revised: 03/01/2013 Document Reviewed: 04/25/2011 Elsevier Interactive Patient Education 2016 Elsevier Inc.  Stimulant Use Disorder-Cocaine Cocaine is one of a group of powerful drugs called stimulants. Cocaine has medical uses for stopping nosebleeds and for pain control before minor nose or dental surgery. However, cocaine is misused because of the effects that it produces. These effects include:   A feeling of extreme pleasure.  Alertness.  High energy. Common street names for cocaine include coke, crack, blow, snow, and nose candy. Cocaine is snorted, dissolved in water and injected, or smoked.  Stimulants are  addictive because they activate regions of the brain that produce both the pleasurable sensation of "reward" and psychological dependence. Together,  these actions account for loss of control and the rapid development of drug dependence. This means you become ill without the drug (withdrawal) and need to keep using it to function.  Stimulant use disorder is use of stimulants that disrupts your daily life. It disrupts relationships with family and friends and how you do your job. Cocaine increases your blood pressure and heart rate. It can cause a heart attack or stroke. Cocaine can also cause death from irregular heart rate or seizures. SYMPTOMS Symptoms of stimulant use disorder with cocaine include:  Use of cocaine in larger amounts or over a longer period of time than intended.  Unsuccessful attempts to cut down or control cocaine use.  A lot of time spent obtaining, using, or recovering from the effects of cocaine.  A strong desire or urge to use cocaine (craving).  Continued use of cocaine in spite of major problems at work, school, or home because of use.  Continued use of cocaine in spite of relationship problems because of use.  Giving up or cutting down on important life activities because of cocaine use.  Use of cocaine over and over in situations when it is physically hazardous, such as driving a car.  Continued use of cocaine in spite of a physical problem that is likely related to use. Physical problems can include:  Malnutrition.  Nosebleeds.  Chest pain.  High blood pressure.  A hole that develops between the part of your nose that separates your nostrils (perforated nasal septum).  Lung and kidney damage.  Continued use of cocaine in spite of a mental problem that is likely related to use. Mental problems can include:  Schizophrenia-like symptoms.  Depression.  Bipolar mood swings.  Anxiety.  Sleep problems.  Need to use more and more cocaine to get the same effect, or lessened effect over time with use of the same amount of cocaine (tolerance).  Having withdrawal symptoms when cocaine use is  stopped, or using cocaine to reduce or avoid withdrawal symptoms. Withdrawal symptoms include:  Depressed or irritable mood.  Low energy or restlessness.  Bad dreams.  Poor or excessive sleep.  Increased appetite. DIAGNOSIS Stimulant use disorder is diagnosed by your health care provider. You may be asked questions about your cocaine use and how it affects your life. A physical exam may be done. A drug screen may be ordered. You may be referred to a mental health professional. The diagnosis of stimulant use disorder requires at least two symptoms within 12 months. The type of stimulant use disorder depends on the number of signs and symptoms you have. The type may be:  Mild. Two or three signs and symptoms.  Moderate. Four or five signs and symptoms.  Severe. Six or more signs and symptoms. TREATMENT Treatment for stimulant use disorder is usually provided by mental health professionals with training in substance use disorders. The following options are available:  Counseling or talk therapy. Talk therapy addresses the reasons you use cocaine and ways to keep you from using again. Goals of talk therapy include:  Identifying and avoiding triggers for use.  Handling cravings.  Replacing use with healthy activities.  Support groups. Support groups provide emotional support, advice, and guidance.  Medicine. Certain medicines may decrease cocaine cravings or withdrawal symptoms. HOME CARE INSTRUCTIONS  Take medicines only as directed by your health care provider.  Identify the people  and activities that trigger your cocaine use and avoid them.  Keep all follow-up visits as directed by your health care provider. SEEK MEDICAL CARE IF:  Your symptoms get worse or you relapse.  You are not able to take medicines as directed. SEEK IMMEDIATE MEDICAL CARE IF:  You have serious thoughts about hurting yourself or others.  You have a seizure, chest pain, sudden weakness, or loss of  speech or vision. FOR MORE INFORMATION  National Institute on Drug Abuse: http://www.price-smith.com/www.drugabuse.gov  Substance Abuse and Mental Health Services Administration: SkateOasis.com.ptwww.samhsa.gov   This information is not intended to replace advice given to you by your health care provider. Make sure you discuss any questions you have with your health care provider.   Document Released: 02/22/2000 Document Revised: 03/17/2014 Document Reviewed: 03/09/2013 Elsevier Interactive Patient Education [employer] Elsevier Inc.

## 2015-08-16 ENCOUNTER — Ambulatory Visit: Payer: BLUE CROSS/BLUE SHIELD | Admitting: Dietician

## 2015-08-22 ENCOUNTER — Ambulatory Visit (INDEPENDENT_AMBULATORY_CARE_PROVIDER_SITE_OTHER): Payer: BLUE CROSS/BLUE SHIELD | Admitting: Family Medicine

## 2015-08-22 ENCOUNTER — Telehealth: Payer: Self-pay

## 2015-08-22 VITALS — BP 128/86 | HR 84 | Temp 98.1°F | Resp 18 | Ht 67.0 in | Wt 291.2 lb

## 2015-08-22 DIAGNOSIS — B373 Candidiasis of vulva and vagina: Secondary | ICD-10-CM | POA: Diagnosis not present

## 2015-08-22 DIAGNOSIS — J069 Acute upper respiratory infection, unspecified: Secondary | ICD-10-CM

## 2015-08-22 DIAGNOSIS — B3731 Acute candidiasis of vulva and vagina: Secondary | ICD-10-CM

## 2015-08-22 MED ORDER — BENZONATATE 100 MG PO CAPS: 100.0000 mg | ORAL_CAPSULE | Freq: Three times a day (TID) | ORAL | Status: DC | PRN

## 2015-08-22 MED ORDER — FLUCONAZOLE 150 MG PO TABS: 150.0000 mg | ORAL_TABLET | Freq: Once | ORAL | Status: DC

## 2015-08-22 MED ORDER — AZITHROMYCIN 250 MG PO TABS: ORAL_TABLET | ORAL | Status: DC

## 2015-08-22 NOTE — Telephone Encounter (Signed)
Pt was just seen by debbie and was told she would have a diflucan rx sent to pharmacy but it is not there  Best number [phone]

## 2015-08-22 NOTE — Patient Instructions (Addendum)
IF you received an x-ray today, you will receive an invoice from [facility]. Please contact [facility] at [phone] with questions or concerns regarding your invoice.   IF you received labwork today, you will receive an invoice from [facility]. Please contact [facility] at [phone] with questions or concerns regarding your invoice.   Our billing staff will not be able to assist you with questions regarding bills from these companies.  You will be contacted with the lab results as soon as they are available. The fastest way to get your results is to activate your My Chart account. Instructions are located on the last page of this paperwork. If you have not heard from us regarding the results in 2 weeks, please contact this office.   Upper Respiratory Infection, Adult Most upper respiratory infections (URIs) are a viral infection of the air passages leading to the lungs. A URI affects the nose, throat, and upper air passages. The most common type of URI is nasopharyngitis and is typically referred to as "the common cold." URIs run their course and usually go away on their own. Most of the time, a URI does not require medical attention, but sometimes a bacterial infection in the upper airways can follow a viral infection. This is called a secondary infection. Sinus and middle ear infections are common types of secondary upper respiratory infections. Bacterial pneumonia can also complicate a URI. A URI can worsen asthma and chronic obstructive pulmonary disease (COPD). Sometimes, these complications can require emergency medical care and may be life threatening.  CAUSES Almost all URIs are caused by viruses. A virus is a type of germ and can spread from one person to another.  RISKS FACTORS You may be at risk for a URI if:   You smoke.   You have chronic heart or lung disease.  You have a weakened defense (immune) system.   You are very young  or very old.   You have nasal allergies or asthma.  You work in crowded or poorly ventilated areas.  You work in health care facilities or schools. SIGNS AND SYMPTOMS  Symptoms typically develop 2-3 days after you come in contact with a cold virus. Most viral URIs last 7-10 days. However, viral URIs from the influenza virus (flu virus) can last 14-18 days and are typically more severe. Symptoms may include:   Runny or stuffy (congested) nose.   Sneezing.   Cough.   Sore throat.   Headache.   Fatigue.   Fever.   Loss of appetite.   Pain in your forehead, behind your eyes, and over your cheekbones (sinus pain).  Muscle aches.  DIAGNOSIS  Your health care provider may diagnose a URI by:  Physical exam.  Tests to check that your symptoms are not due to another condition such as:  Strep throat.  Sinusitis.  Pneumonia.  Asthma. TREATMENT  A URI goes away on its own with time. It cannot be cured with medicines, but medicines may be prescribed or recommended to relieve symptoms. Medicines may help:  Reduce your fever.  Reduce your cough.  Relieve nasal congestion. HOME CARE INSTRUCTIONS   Take medicines only as directed by your health care provider.   Gargle warm saltwater or take cough drops to comfort your throat as directed by your health care provider.  Use a warm mist humidifier or inhale steam from a shower to increase air moisture. This may make it easier to breathe.  Drink enough fluid to keep your   urine clear or pale yellow.   Eat soups and other clear broths and maintain good nutrition.   Rest as needed.   Return to work when your temperature has returned to normal or as your health care provider advises. You may need to stay home longer to avoid infecting others. You can also use a face mask and careful hand washing to prevent spread of the virus.  Increase the usage of your inhaler if you have asthma.   Do not use any tobacco  products, including cigarettes, chewing tobacco, or electronic cigarettes. If you need help quitting, ask your health care provider. PREVENTION  The best way to protect yourself from getting a cold is to practice good hygiene.   Avoid oral or hand contact with people with cold symptoms.   Wash your hands often if contact occurs.  There is no clear evidence that vitamin C, vitamin E, echinacea, or exercise reduces the chance of developing a cold. However, it is always recommended to get plenty of rest, exercise, and practice good nutrition.  SEEK MEDICAL CARE IF:   You are getting worse rather than better.   Your symptoms are not controlled by medicine.   You have chills.  You have worsening shortness of breath.  You have brown or red mucus.  You have yellow or brown nasal discharge.  You have pain in your face, especially when you bend forward.  You have a fever.  You have swollen neck glands.  You have pain while swallowing.  You have white areas in the back of your throat. SEEK IMMEDIATE MEDICAL CARE IF:   You have severe or persistent:  Headache.  Ear pain.  Sinus pain.  Chest pain.  You have chronic lung disease and any of the following:  Wheezing.  Prolonged cough.  Coughing up blood.  A change in your usual mucus.  You have a stiff neck.  You have changes in your:  Vision.  Hearing.  Thinking.  Mood. MAKE SURE YOU:   Understand these instructions.  Will watch your condition.  Will get help right away if you are not doing well or get worse.   This information is not intended to replace advice given to you by your health care provider. Make sure you discuss any questions you have with your health care provider.   Document Released: 08/20/2000 Document Revised: 07/11/2014 Document Reviewed: 06/01/2013 Elsevier Interactive Patient Education 2016 Elsevier Inc.  

## 2015-08-22 NOTE — Telephone Encounter (Signed)
Prescription was sent

## 2015-08-22 NOTE — Telephone Encounter (Signed)
Ok to send

## 2015-08-22 NOTE — Progress Notes (Signed)
Subjective:    Patient ID: Alyssa Blanchard, female    DOB: 08-06-1970, 45 y.o.   MRN: [id number]  HPI This is a 45 yo female who presents today with 2 weeks of cough. Non productive. Has been taking Muicinex without relief. Tessalon Perles with relief. No sore throat or ear pain, some headache. Occasional SOB, some help with albuterol inhaler 4x/day. She was just discharged from a rehab facility for cocaine addiction and everyone there had similar symptoms. No asthma history. Non smoker.   She requests diflucan for secondary vaginal yeast infection.   Past Medical History  Diagnosis Date  . Depression   . GERD (gastroesophageal reflux disease)   . Anxiety   . Asthma   . Migraines   . History of chicken pox   . STD (sexually transmitted disease)     chl hx & hsv 1&2  . History of bronchitis   . History of urinary tract infection   . Urinary incontinence   . Tremors of nervous system   . Plantar fasciitis     bilat  . Heel spur     bilat   Past Surgical History  Procedure Laterality Date  . Tonsillectomy  1978  . 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 Diabetes Mother   . Hypertension Mother   . Hyperlipidemia Mother   . Kidney disease Mother   . Cancer Father     pancreatic  . Hypertension Maternal Grandmother   . Diabetes Maternal Grandmother   . Heart failure Maternal Grandmother     CHF  . Heart attack Maternal Grandfather   . Hypertension Maternal Grandfather   . Hypertension Paternal Grandmother   . Alzheimer's disease Paternal Grandmother   . Hypertension Paternal Grandfather   . Cancer Maternal Uncle     melanoma  . Cancer Paternal Uncle     kidney   Social History  Substance Use Topics  . Smoking status: Former  Smoker -- 0.25 packs/day for 5 years    Types: Cigarettes    Quit date: 03/10/2012  . Smokeless tobacco: Never Used  . Alcohol Use: 0.0 oz/week    0 Standard drinks or equivalent per week     Comment: 1 a month      Review of Systems + cough, fever to 101.5 several days ago, nasal congestion, no drainage, no ear pain, no sore throat.     Objective:   Physical Exam  Constitutional: She appears well-developed and well-nourished.  HENT:  Head: Normocephalic and atraumatic.  Mouth/Throat: Oropharynx is clear and moist.  Eyes: Conjunctivae are normal.  Neck: Normal range of motion. Neck supple.  Cardiovascular: Normal rate, regular rhythm and normal heart sounds.   Pulmonary/Chest: She has wheezes (few faint, expiratory wheezes. ).  Lymphadenopathy:    She has no cervical adenopathy.  Vitals reviewed.     BP 128/86 mmHg  Pulse 84  Temp(Src) 98.1 F (36.7 C) (Oral)  Resp 18  Ht  (1.702 m)  Wt 291 lb 3.2 oz (132.087 kg)  BMI 45.60 kg/m2  SpO2 98%  LMP 08/13/2015 Wt Readings from Last 3 Encounters:  08/22/15 291 lb 3.2 oz (132.087 kg)  05/23/15 313 lb 8 oz (142.203 kg)  02/21/15 340 lb (154.223 kg)  Assessment & Plan:  1. Upper respiratory infection with cough and congestion - azithromycin (ZITHROMAX) 250 MG tablet; Take two tablets today then one a day until finished  Dispense: 6 tablet; Refill: 0 - benzonatate (TESSALON) 100 MG capsule; Take 1-2 capsules (100-200 mg total) by mouth 3 (three) times daily as needed for cough.  Dispense: 40 capsule; Refill: 0 - she has albuterol inhaler at home, I instructed her to use every 2-4 hours while awake for the next 2 days for wheeze and cough - RTC precautions reviewed  2. Vaginal yeast infection - fluconazole (DIFLUCAN) 150 MG tablet; Take 1 tablet (150 mg total) by mouth once. Repeat if needed  Dispense: 2 tablet; Refill: 0   Olean Reeeborah Gessner, FNP-BC  [facility]  08/22/2015 6:24 PM

## 2015-08-27 ENCOUNTER — Ambulatory Visit (INDEPENDENT_AMBULATORY_CARE_PROVIDER_SITE_OTHER): Payer: BLUE CROSS/BLUE SHIELD | Admitting: Family Medicine

## 2015-08-27 VITALS — BP 124/82 | HR 74 | Temp 98.0°F | Resp 18 | Ht 67.0 in | Wt 293.6 lb

## 2015-08-27 DIAGNOSIS — J208 Acute bronchitis due to other specified organisms: Secondary | ICD-10-CM | POA: Diagnosis not present

## 2015-08-27 DIAGNOSIS — R059 Cough, unspecified: Secondary | ICD-10-CM

## 2015-08-27 DIAGNOSIS — R05 Cough: Secondary | ICD-10-CM | POA: Diagnosis not present

## 2015-08-27 MED ORDER — PREDNISONE 20 MG PO TABS: ORAL_TABLET | ORAL | Status: DC

## 2015-08-27 NOTE — Patient Instructions (Addendum)
   IF you received an x-ray today, you will receive an invoice from [facility]. Please contact [facility] at [phone] with questions or concerns regarding your invoice.   IF you received labwork today, you will receive an invoice from [facility]. Please contact [facility] at [phone] with questions or concerns regarding your invoice.   Our billing staff will not be able to assist you with questions regarding bills from these companies.  You will be contacted with the lab results as soon as they are available. The fastest way to get your results is to activate your My Chart account. Instructions are located on the last page of this paperwork. If you have not heard from us regarding the results in 2 weeks, please contact this office.    Acute Bronchitis Bronchitis is inflammation of the airways that extend from the windpipe into the lungs (bronchi). The inflammation often causes mucus to develop. This leads to a cough, which is the most common symptom of bronchitis.  In acute bronchitis, the condition usually develops suddenly and goes away over time, usually in a couple weeks. Smoking, allergies, and asthma can make bronchitis worse. Repeated episodes of bronchitis may cause further lung problems.  CAUSES Acute bronchitis is most often caused by the same virus that causes a cold. The virus can spread from person to person (contagious) through coughing, sneezing, and touching contaminated objects. SIGNS AND SYMPTOMS   Cough.   Fever.   Coughing up mucus.   Body aches.   Chest congestion.   Chills.   Shortness of breath.   Sore throat.  DIAGNOSIS  Acute bronchitis is usually diagnosed through a physical exam. Your health care provider will also ask you questions about your medical history. Tests, such as chest X-rays, are sometimes done to rule out other conditions.  TREATMENT  Acute bronchitis usually goes away in a  couple weeks. Oftentimes, no medical treatment is necessary. Medicines are sometimes given for relief of fever or cough. Antibiotic medicines are usually not needed but may be prescribed in certain situations. In some cases, an inhaler may be recommended to help reduce shortness of breath and control the cough. A cool mist vaporizer may also be used to help thin bronchial secretions and make it easier to clear the chest.  HOME CARE INSTRUCTIONS  Get plenty of rest.   Drink enough fluids to keep your urine clear or pale yellow (unless you have a medical condition that requires fluid restriction). Increasing fluids may help thin your respiratory secretions (sputum) and reduce chest congestion, and it will prevent dehydration.   Take medicines only as directed by your health care provider.  If you were prescribed an antibiotic medicine, finish it all even if you start to feel better.  Avoid smoking and secondhand smoke. Exposure to cigarette smoke or irritating chemicals will make bronchitis worse. If you are a smoker, consider using nicotine gum or skin patches to help control withdrawal symptoms. Quitting smoking will help your lungs heal faster.   Reduce the chances of another bout of acute bronchitis by washing your hands frequently, avoiding people with cold symptoms, and trying not to touch your hands to your mouth, nose, or eyes.   Keep all follow-up visits as directed by your health care provider.  SEEK MEDICAL CARE IF: Your symptoms do not improve after 1 week of treatment.  SEEK IMMEDIATE MEDICAL CARE IF:  You develop an increased fever or chills.   You have chest pain.     You have severe shortness of breath.  You have bloody sputum.   You develop dehydration.  You faint or repeatedly feel like you are going to pass out.  You develop repeated vomiting.  You develop a severe headache. MAKE SURE YOU:   Understand these instructions.  Will watch your  condition.  Will get help right away if you are not doing well or get worse.   This information is not intended to replace advice given to you by your health care provider. Make sure you discuss any questions you have with your health care provider.   Document Released: 04/03/2004 Document Revised: 03/17/2014 Document Reviewed: 08/17/2012 Elsevier Interactive Patient Education 2016 Elsevier Inc.  

## 2015-08-27 NOTE — Progress Notes (Signed)
45 yo woman with cough.  She has had this nonproductive cough for a month and has already taken a Z pak without improvement.  She has asthma and does not smoke  Patient just [place] day residential rehabilitation [place]. She's gone back to live with her mother. 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  Objective: BP 124/82 mmHg  Pulse 74  Temp(Src) 98 F (36.7 C) (Oral)  Resp 18  Ht 5\' 7"  (1.702 m)  Wt 293 lb 9.6 oz (133.176 kg)  BMI 45.97 kg/m2  SpO2 96%  LMP 08/13/2015 HEENT: Unremarkable Chest: Few expiratory wheezes bilaterally Heart: Regular no murmur Extremity: No edema Skin: Normal color normal Patient's in no acute distress, morbidly obese  Assessment: Bronchitis secondary to presumed virus  Plan:Cough - Plan: predniSONE (DELTASONE) 20 MG tablet  Acute bronchitis due to other specified organisms  Continue use of inhalers  Sheila OatsKurt Vang Kraeger M.D.

## 2015-08-28 ENCOUNTER — Telehealth: Payer: Self-pay

## 2015-08-28 DIAGNOSIS — J069 Acute upper respiratory infection, unspecified: Secondary | ICD-10-CM

## 2015-08-28 MED ORDER — BENZONATATE 100 MG PO CAPS: 100.0000 mg | ORAL_CAPSULE | Freq: Three times a day (TID) | ORAL | Status: DC | PRN

## 2015-08-28 NOTE — Telephone Encounter (Signed)
Ok sent!

## 2015-08-28 NOTE — Telephone Encounter (Signed)
Pt states that the tussilin pearls was night sent over to her pharmacy last night   Best number [phone]

## 2015-11-28 ENCOUNTER — Ambulatory Visit (INDEPENDENT_AMBULATORY_CARE_PROVIDER_SITE_OTHER): Payer: BLUE CROSS/BLUE SHIELD

## 2015-11-28 ENCOUNTER — Encounter: Payer: Self-pay | Admitting: Podiatry

## 2015-11-28 ENCOUNTER — Ambulatory Visit (INDEPENDENT_AMBULATORY_CARE_PROVIDER_SITE_OTHER): Payer: BLUE CROSS/BLUE SHIELD | Admitting: Podiatry

## 2015-11-28 DIAGNOSIS — M79671 Pain in right foot: Secondary | ICD-10-CM

## 2015-11-28 DIAGNOSIS — M79672 Pain in left foot: Secondary | ICD-10-CM

## 2015-11-28 DIAGNOSIS — M722 Plantar fascial fibromatosis: Secondary | ICD-10-CM | POA: Diagnosis not present

## 2015-11-28 MED ORDER — TRIAMCINOLONE ACETONIDE 10 MG/ML IJ SUSP
10.0000 mg | Freq: Once | INTRAMUSCULAR | Status: AC
  Administered 2015-11-28: 10 mg

## 2015-11-30 NOTE — Progress Notes (Signed)
Subjective:     Patient ID: Alyssa Blanchard, female   DOB: 1970/03/27, 45 y.o.   MRN: [id number]  HPI patient presents stating my heels have flared up in the last month and are making it hard for me to walk   Review of Systems     Objective:   Physical Exam Neurovascular status intact muscle strength adequate with inflammation around the plantar heel region bilateral with fluid buildup within the medial band    Assessment:     Plantar fasciitis bilateral heels    Plan:     H&P conditions reviewed and injected the plantar fascia bilateral 3 mg Kenalog 5 mg Xylocaine and instructed on physical therapy

## 2015-12-05 ENCOUNTER — Other Ambulatory Visit: Payer: Self-pay | Admitting: Obstetrics & Gynecology

## 2015-12-05 ENCOUNTER — Other Ambulatory Visit: Payer: Self-pay | Admitting: Surgery

## 2015-12-05 DIAGNOSIS — Z1231 Encounter for screening mammogram for malignant neoplasm of breast: Secondary | ICD-10-CM

## 2015-12-13 ENCOUNTER — Ambulatory Visit
Admission: RE | Admit: 2015-12-13 | Discharge: 2015-12-13 | Disposition: A | Discharge status: Home / Self Care | Payer: BLUE CROSS/BLUE SHIELD | Source: Ambulatory Visit | Attending: Surgery | Admitting: Surgery

## 2015-12-13 DIAGNOSIS — Z1231 Encounter for screening mammogram for malignant neoplasm of breast: Secondary | ICD-10-CM

## 2016-02-07 ENCOUNTER — Ambulatory Visit (INDEPENDENT_AMBULATORY_CARE_PROVIDER_SITE_OTHER): Payer: BLUE CROSS/BLUE SHIELD | Admitting: Podiatry

## 2016-02-07 ENCOUNTER — Encounter: Payer: Self-pay | Admitting: Podiatry

## 2016-02-07 VITALS — BP 126/76 | HR 70

## 2016-02-07 DIAGNOSIS — M722 Plantar fascial fibromatosis: Secondary | ICD-10-CM | POA: Diagnosis not present

## 2016-02-07 MED ORDER — TRIAMCINOLONE ACETONIDE 10 MG/ML IJ SUSP
10.0000 mg | Freq: Once | INTRAMUSCULAR | Status: AC
  Administered 2016-02-07: 10 mg

## 2016-02-07 NOTE — Progress Notes (Signed)
Subjective:     Patient ID: Alyssa Blanchard, female   DOB: 24-May-1970, 45 y.o.   MRN: [id number]  HPI patient states both of my heels that become very sore again over the last couple weeks and are worse when I get up in the morning and after periods of sitting   Review of Systems     Objective:   Physical Exam Neurovascular status intact with inflammatory changes plantar heel bilateral with fluid buildup around the medial band localized in nature with worsening of symptoms after periods of sitting and in the morning    Assessment:     Acute plantar fasciitis which only responded short-term to previous injection treatment    Plan:     Reviewed condition and at this time recommended injections which were accomplished 3 mg Kenalog 5 mg Xylocaine and dispensed night splint with all instructions on usage

## 2016-03-05 ENCOUNTER — Ambulatory Visit (INDEPENDENT_AMBULATORY_CARE_PROVIDER_SITE_OTHER): Payer: BLUE CROSS/BLUE SHIELD | Admitting: Emergency Medicine

## 2016-03-05 VITALS — BP 138/90 | HR 74 | Resp 18 | Ht 67.0 in | Wt 281.0 lb

## 2016-03-05 DIAGNOSIS — R0981 Nasal congestion: Secondary | ICD-10-CM | POA: Diagnosis not present

## 2016-03-05 DIAGNOSIS — J019 Acute sinusitis, unspecified: Secondary | ICD-10-CM | POA: Diagnosis not present

## 2016-03-05 MED ORDER — FLUTICASONE PROPIONATE 50 MCG/ACT NA SUSP: 2.0000 | Freq: Every day | NASAL | 6 refills | Status: DC

## 2016-03-05 MED ORDER — AMOXICILLIN-POT CLAVULANATE 875-125 MG PO TABS: 1.0000 | ORAL_TABLET | Freq: Two times a day (BID) | ORAL | 0 refills | Status: DC

## 2016-03-05 MED ORDER — PREDNISONE 20 MG PO TABS: 20.0000 mg | ORAL_TABLET | Freq: Every day | ORAL | 0 refills | Status: AC

## 2016-03-05 MED ORDER — FLUCONAZOLE 150 MG PO TABS: 150.0000 mg | ORAL_TABLET | Freq: Once | ORAL | 0 refills | Status: AC

## 2016-03-05 NOTE — Progress Notes (Signed)
Alyssa Blanchard 45 y.o.   Chief Complaint  Patient presents with  . Sinusitis  . Chills    HISTORY OF PRESENT ILLNESS: This is a 45 y.o. female complaining of sinus pressure/congestion with fever and chills x 3-4 days gradually getting worse.  HPI   Prior to Admission medications   Medication Sig Start Date End Date Taking? Authorizing Provider  acetaminophen (TYLENOL) 500 MG tablet Take 1,000 mg by mouth every 6 (six) hours as needed for mild pain.   Yes Historical Provider, MD  albuterol (PROVENTIL HFA;VENTOLIN HFA) 108 (90 BASE) MCG/ACT inhaler Inhale 2 puffs into the lungs every 6 (six) hours as needed. 07/09/13  Yes Elvina Sidle, MD  ALPRAZolam [name]) 1 MG tablet TAKE 1/2 TO 1 TABLET BY MOUTH EVERY 12 HOURS AS NEEDED (FILLABLE 01/24/16) 01/16/16  Yes Historical Provider, MD  ARIPiprazole (ABILIFY) 15 MG tablet Take 20 mg by mouth at bedtime.    Yes Historical Provider, MD  buPROPion (WELLBUTRIN XL) 300 MG 24 hr tablet Take 300 mg by mouth every morning.  08/09/14  Yes Historical Provider, MD  famotidine (PEPCID) 20 MG tablet Take 20 mg by mouth 2 (two) times daily.   Yes Historical Provider, MD  ketoprofen (ORUDIS) 50 MG capsule Take 1 capsule (50 mg total) by mouth every 6 (six) hours as needed. For headache 10/10/14  Yes Melvyn Novas, MD  Multiple Vitamin (MULTIVITAMIN WITH MINERALS) TABS tablet Take 1 tablet by mouth daily.   Yes Historical Provider, MD  Multiple Vitamins-Calcium (VIACTIV MULTI-VITAMIN) CHEW Chew 1 tablet by mouth daily.   Yes Historical Provider, MD  pantoprazole (PROTONIX) 40 MG tablet Take 40 mg by mouth daily.   Yes Historical Provider, MD  promethazine (PHENERGAN) 12.5 MG tablet 1 (ONE) TABLET BY MOUTH EVERY FOUR HOURS, AS NEEDED 12/03/15  Yes Historical Provider, MD  SUMAtriptan (IMITREX) 100 MG tablet ! Tab at onset of HA. May repeat in 2 hours if headache persists or recurs.No more than 4 doses a month. 08/29/14  Yes [name], MD  benztropine  (COGENTIN) 0.5 MG tablet  08/20/15   Historical Provider, MD  hydrOXYzine (VISTARIL) 25 MG capsule  08/09/15   Historical Provider, MD  LORazepam (ATIVAN) 1 MG tablet Take 1 mg by mouth 2 (two) times daily.  08/10/14   Historical Provider, MD  Lurasidone HCl (LATUDA) 60 MG TABS Take 1 tablet by mouth at bedtime. Reported on 05/23/2015    Historical Provider, MD  rOPINIRole (REQUIP) 0.25 MG tablet Take 1 tablet (0.25 mg total) by mouth 3 (three) times daily. Patient not taking: Reported on 03/05/2016 10/10/14   Melvyn Novas, MD  topiramate (TOPAMAX) 25 MG tablet Take 1 tablet (25 mg total) by mouth 2 (two) times daily. Patient not taking: Reported on 03/05/2016 08/31/14   Melvyn Novas, MD    No Known Allergies  Patient Active Problem List   Diagnosis Date Noted  . 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 Migraine without aura and without status migrainosus, not intractable 10/10/2014  . Migraine with aura and without status migrainosus, not intractable 08/29/2014  . 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 classified elsewhere 08/29/2014  . 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 Snorings 07/10/2014  . Sleep related headaches 07/10/2014  . Nocturia more than twice per night 07/10/2014  . Preventative health care 07/30/2012  . Morbid obesity (HCC) 07/30/2012    Past Medical History:  Diagnosis Date  . Anxiety   . Asthma   . Depression   .  GERD (gastroesophageal reflux disease)   . Heel spur    bilat  . History of bronchitis   . History of chicken pox   . History of urinary tract infection   . Migraines   . Plantar fasciitis    bilat  . STD (sexually transmitted disease)    chl hx & hsv 1&2  . Tremors of nervous system   . Urinary incontinence     Past Surgical History:  Procedure Laterality Date  . LAPAROSCOPIC GASTRIC SLEEVE RESECTION N/A 11/27/2014   Procedure: LAPAROSCOPIC GASTRIC SLEEVE RESECTION WITH HIATAL HERNIA REPAIR UPPER ENDOSCOPY;   Surgeon: Luretha MurphyMatthew Martin, MD;  Location: [facility];  Service: General;  Laterality: N/A;  . TONSILLECTOMY  1978    Social History   Social History  . Marital status: Single    Spouse name: N/A  . Number of children: N/A  . Years of education: 9312   Occupational History  . [employer]   Social History Main Topics  . Smoking status: Former Smoker    Packs/day: 0.25    Years: 5.00    Types: Cigarettes    Quit date: 03/10/2012  . Smokeless tobacco: Never Used  . Alcohol use 0.0 oz/week     Comment: 1 a month  . Drug use: No  . Sexual activity: Yes    Partners: Male    Birth control/ protection: Condom   Other Topics Concern  . Not on file   Social History Narrative   Regular exercise-no   Caffeine Use-yes, 1-2 cups of caffeine daily    Family History  Problem Relation Age of Onset  . Diabetes Mother   . Hypertension Mother   . Hyperlipidemia Mother   . Kidney disease Mother   . Cancer Father     pancreatic  . Hypertension Maternal Grandmother   . Diabetes Maternal Grandmother   . Heart failure Maternal Grandmother     CHF  . Heart attack Maternal Grandfather   . Hypertension Maternal Grandfather   . Hypertension Paternal Grandmother   . Alzheimer's disease Paternal Grandmother   . Hypertension Paternal Grandfather   . Cancer Maternal Uncle     melanoma  . Cancer Paternal Uncle     kidney     Review of Systems  Constitutional: Positive for chills and fever.  HENT: Positive for congestion, sinus pain and sore throat. Negative for nosebleeds.   Eyes: Negative.   Respiratory: Positive for cough. Negative for hemoptysis and shortness of breath.   Cardiovascular: Negative.   Gastrointestinal: Negative for abdominal pain, diarrhea, nausea and vomiting.  Genitourinary: Negative.   Musculoskeletal: Negative.   Skin: Negative.   Neurological: Negative.   Endo/Heme/Allergies: Negative.   Psychiatric/Behavioral: Negative.   All other systems  reviewed and are negative.    Physical Exam  Constitutional: She is oriented to person, place, and time. She appears well-developed and well-nourished.  HENT:  Head: Normocephalic and atraumatic.  Eyes: Conjunctivae and EOM are normal. Pupils are equal, round, and reactive to light.  Neck: Normal range of motion. Neck supple.  Cardiovascular: Normal rate, regular rhythm, normal heart sounds and intact distal pulses.   Pulmonary/Chest: Effort normal and breath sounds normal.  Abdominal: Soft. Bowel sounds are normal.  Musculoskeletal: Normal range of motion.  Neurological: She is alert and oriented to person, place, and time.  Skin: Skin is warm and dry. Capillary refill takes less than 2 seconds.  Psychiatric: She has a normal mood and affect. Her behavior is  normal.    Vitals:   03/05/16 1529  BP: 138/90  Pulse: 74  Resp: 18    ASSESSMENT & PLAN: [name] was seen today for sinusitis and chills.  Diagnoses and all orders for this visit:  Acute non-recurrent sinusitis, unspecified location -     Care order/instruction:  Sinus congestion  Other orders -     amoxicillin-clavulanate (AUGMENTIN) 875-125 MG tablet; Take 1 tablet by mouth 2 (two) times daily. -     fluticasone (FLONASE) 50 MCG/ACT nasal spray; Place 2 sprays into both nostrils daily. -     predniSONE (DELTASONE) 20 MG tablet; Take 1 tablet (20 mg total) by mouth daily with breakfast.    Patient Instructions       IF you received an x-ray today, you will receive an invoice from [facility]. Please contact [facility] at [phone] with questions or concerns regarding your invoice.   IF you received labwork today, you will receive an invoice from [facility]. Please contact [facility] at [phone] with questions or concerns regarding your invoice.   Our billing staff will not be able to assist you with questions regarding bills from these companies.  You will be contacted with the lab  results as soon as they are available. The fastest way to get your results is to activate your My Chart account. Instructions are located on the last page of this paperwork. If you have not heard from us regarding the results in 2 weeks, please contact this office.     Sinusitis, Adult Sinusitis is soreness and inflammation of your sinuses. Sinuses are hollow spaces in the bones around your face. They are located:  Around your eyes.  In the middle of your forehead.  Behind your nose.  In your cheekbones. Your sinuses and nasal passages are lined with a stringy fluid (mucus). Mucus normally drains out of your sinuses. When your nasal tissues get inflamed or swollen, the mucus can get trapped or blocked so air cannot flow through your sinuses. This lets bacteria, viruses, and funguses grow, and that leads to infection. Follow these instructions at home: Medicines  Take, use, or apply over-the-counter and prescription medicines only as told by your doctor. These may include nasal sprays.  If you were prescribed an antibiotic medicine, take it as told by your doctor. Do not stop taking the antibiotic even if you start to feel better. Hydrate and Humidify  Drink enough water to keep your pee (urine) clear or pale yellow.  Use a cool mist humidifier to keep the humidity level in your home above 50%.  Breathe in steam for 10-15 minutes, 3-4 times a day or as told by your doctor. You can do this in the bathroom while a hot shower is running.  Try not to spend time in cool or dry air. Rest  Rest as much as possible.  Sleep with your head raised (elevated).  Make sure to get enough sleep each night. General instructions  Put a warm, moist washcloth on your face 3-4 times a day or as told by your doctor. This will help with discomfort.  Wash your hands often with soap and water. If there is no soap and water, use hand sanitizer.  Do not smoke. Avoid being around people who are smoking  (secondhand smoke).  Keep all follow-up visits as told by your doctor. This is important. Contact a doctor if:  You have a fever.  Your symptoms get worse.  Your symptoms do not get better within  10 days. Get help right away if:  You have a very bad headache.  You cannot stop throwing up (vomiting).  You have pain or swelling around your face or eyes.  You have trouble seeing.  You feel confused.  Your neck is stiff.  You have trouble breathing. This information is not intended to replace advice given to you by your health care provider. Make sure you discuss any questions you have with your health care provider. Document Released: 08/13/2007 Document Revised: 10/21/2015 Document Reviewed: 12/20/2014 Elsevier Interactive Patient Education  2017 Elsevier Inc.       Edwina Barth, MD [facility]

## 2016-03-05 NOTE — Patient Instructions (Addendum)
     IF you received an x-ray today, you will receive an invoice from [facility]. Please contact  Radiology at [phone] with questions or concerns regarding your invoice.   IF you received labwork today, you will receive an invoice from [facility]. Please contact [facility] at [phone] with questions or concerns regarding your invoice.   Our billing staff will not be able to assist you with questions regarding bills from these companies.  You will be contacted with the lab results as soon as they are available. The fastest way to get your results is to activate your My Chart account. Instructions are located on the last page of this paperwork. If you have not heard from us regarding the results in 2 weeks, please contact this office.      Sinusitis, Adult Sinusitis is soreness and inflammation of your sinuses. Sinuses are hollow spaces in the bones around your face. They are located:  Around your eyes.  In the middle of your forehead.  Behind your nose.  In your cheekbones. Your sinuses and nasal passages are lined with a stringy fluid (mucus). Mucus normally drains out of your sinuses. When your nasal tissues get inflamed or swollen, the mucus can get trapped or blocked so air cannot flow through your sinuses. This lets bacteria, viruses, and funguses grow, and that leads to infection. Follow these instructions at home: Medicines   Take, use, or apply over-the-counter and prescription medicines only as told by your doctor. These may include nasal sprays.  If you were prescribed an antibiotic medicine, take it as told by your doctor. Do not stop taking the antibiotic even if you start to feel better. Hydrate and Humidify   Drink enough water to keep your pee (urine) clear or pale yellow.  Use a cool mist humidifier to keep the humidity level in your home above 50%.  Breathe in steam for 10-15 minutes, 3-4 times a day or as told by your doctor. You can do  this in the bathroom while a hot shower is running.  Try not to spend time in cool or dry air. Rest   Rest as much as possible.  Sleep with your head raised (elevated).  Make sure to get enough sleep each night. General instructions   Put a warm, moist washcloth on your face 3-4 times a day or as told by your doctor. This will help with discomfort.  Wash your hands often with soap and water. If there is no soap and water, use hand sanitizer.  Do not smoke. Avoid being around people who are smoking (secondhand smoke).  Keep all follow-up visits as told by your doctor. This is important. Contact a doctor if:  You have a fever.  Your symptoms get worse.  Your symptoms do not get better within 10 days. Get help right away if:  You have a very bad headache.  You cannot stop throwing up (vomiting).  You have pain or swelling around your face or eyes.  You have trouble seeing.  You feel confused.  Your neck is stiff.  You have trouble breathing. This information is not intended to replace advice given to you by your health care provider. Make sure you discuss any questions you have with your health care provider. Document Released: 08/13/2007 Document Revised: 10/21/2015 Document Reviewed: 12/20/2014 Elsevier Interactive Patient Education  2017 Elsevier Inc.  

## 2016-03-05 NOTE — Addendum Note (Signed)
Addended by: Clarene CritchleyKOLLER, KAREN M on: 03/05/2016 04:22 PM   Modules accepted: Orders

## 2016-03-05 NOTE — Progress Notes (Signed)
PT CALLED BACK WANTING DIFLUCAN FOR YEAST INFECTION, FORGOT TO ASK AT APPT. VERBAL OK TO GIVE PER DR SAGARDIA, SENT TO PHARMACY

## 2016-04-17 ENCOUNTER — Ambulatory Visit (INDEPENDENT_AMBULATORY_CARE_PROVIDER_SITE_OTHER): Payer: BLUE CROSS/BLUE SHIELD | Admitting: Emergency Medicine

## 2016-04-17 VITALS — BP 118/80 | HR 85 | Temp 98.6°F | Ht 67.0 in | Wt 265.4 lb

## 2016-04-17 DIAGNOSIS — J209 Acute bronchitis, unspecified: Secondary | ICD-10-CM

## 2016-04-17 DIAGNOSIS — R05 Cough: Secondary | ICD-10-CM | POA: Diagnosis not present

## 2016-04-17 DIAGNOSIS — Z8709 Personal history of other diseases of the respiratory system: Secondary | ICD-10-CM | POA: Diagnosis not present

## 2016-04-17 DIAGNOSIS — R059 Cough, unspecified: Secondary | ICD-10-CM

## 2016-04-17 MED ORDER — BENZONATATE 200 MG PO CAPS: 200.0000 mg | ORAL_CAPSULE | Freq: Two times a day (BID) | ORAL | 0 refills | Status: DC | PRN

## 2016-04-17 MED ORDER — AZITHROMYCIN 250 MG PO TABS: ORAL_TABLET | ORAL | 0 refills | Status: DC

## 2016-04-17 MED ORDER — FLUCONAZOLE 150 MG PO TABS: 150.0000 mg | ORAL_TABLET | Freq: Once | ORAL | 0 refills | Status: AC

## 2016-04-17 MED ORDER — PREDNISONE 20 MG PO TABS: 40.0000 mg | ORAL_TABLET | Freq: Every day | ORAL | 0 refills | Status: AC

## 2016-04-17 NOTE — Progress Notes (Signed)
Alyssa Blanchard 46 y.o.   Chief Complaint  Patient presents with  . Cough    X 1 week  . Sinus Problem    X 1 week    HISTORY OF PRESENT ILLNESS: This is a 46 y.o. female complaining of cough and sinus problem x 4-5 days.  URI   This is a new problem. The current episode started in the past 7 days. The problem has been gradually worsening. There has been no fever. Associated symptoms include coughing, sinus pain and wheezing. Pertinent negatives include no abdominal pain, chest pain, congestion, diarrhea, dysuria, ear pain, headaches, joint pain, nausea, neck pain, rash, sore throat, swollen glands or vomiting.     Prior to Admission medications   Medication Sig Start Date End Date Taking? Authorizing Provider  acetaminophen (TYLENOL) 500 MG tablet Take 1,000 mg by mouth every 6 (six) hours as needed for mild pain.   Yes Historical Provider, MD  albuterol (PROVENTIL HFA;VENTOLIN HFA) 108 (90 BASE) MCG/ACT inhaler Inhale 2 puffs into the lungs every 6 (six) hours as needed. 07/09/13  Yes Alyssa Sidle, MD  ALPRAZolam [name]) 1 MG tablet TAKE 1/2 TO 1 TABLET BY MOUTH EVERY 12 HOURS AS NEEDED (FILLABLE 01/24/16) 01/16/16  Yes Historical Provider, MD  ARIPiprazole (ABILIFY) 15 MG tablet Take 20 mg by mouth at bedtime.    Yes Historical Provider, MD  buPROPion (WELLBUTRIN XL) 300 MG 24 hr tablet Take 300 mg by mouth every morning.  08/09/14  Yes Historical Provider, MD  famotidine (PEPCID) 20 MG tablet Take 20 mg by mouth 2 (two) times daily.   Yes Historical Provider, MD  fluticasone (FLONASE) 50 MCG/ACT nasal spray Place 2 sprays into both nostrils daily. 03/05/16  Yes [name] December, MD  Multiple Vitamin (MULTIVITAMIN WITH MINERALS) TABS tablet Take 1 tablet by mouth daily.   Yes Historical Provider, MD  Multiple Vitamins-Calcium (VIACTIV MULTI-VITAMIN) CHEW Chew 1 tablet by mouth daily.   Yes Historical Provider, MD  pantoprazole (PROTONIX) 40 MG tablet Take 40 mg by mouth daily.   Yes  Historical Provider, MD  promethazine (PHENERGAN) 12.5 MG tablet 1 (ONE) TABLET BY MOUTH EVERY FOUR HOURS, AS NEEDED 12/03/15  Yes Historical Provider, MD  amoxicillin-clavulanate (AUGMENTIN) 875-125 MG tablet Take 1 tablet by mouth 2 (two) times daily. Patient not taking: Reported on 04/17/2016 03/05/16   Alyssa Quint, MD  benztropine (COGENTIN) 0.5 MG tablet  08/20/15   Historical Provider, MD  hydrOXYzine (VISTARIL) 25 MG capsule  08/09/15   Historical Provider, MD  ketoprofen (ORUDIS) 50 MG capsule Take 1 capsule (50 mg total) by mouth every 6 (six) hours as needed. For headache Patient not taking: Reported on 04/17/2016 10/10/14   Melvyn Novas, MD  LORazepam (ATIVAN) 1 MG tablet Take 1 mg by mouth 2 (two) times daily.  08/10/14   Historical Provider, MD  Lurasidone HCl (LATUDA) 60 MG TABS Take 1 tablet by mouth at bedtime. Reported on 05/23/2015    Historical Provider, MD  rOPINIRole (REQUIP) 0.25 MG tablet Take 1 tablet (0.25 mg total) by mouth 3 (three) times daily. Patient not taking: Reported on 04/17/2016 10/10/14   Melvyn Novas, MD  SUMAtriptan (IMITREX) 100 MG tablet ! Tab at onset of HA. May repeat in 2 hours if headache persists or recurs.No more than 4 doses a month. Patient not taking: Reported on 04/17/2016 08/29/14   [name], MD  topiramate (TOPAMAX) 25 MG tablet Take 1 tablet (25 mg total) by mouth 2 (two) times  daily. Patient not taking: Reported on 04/17/2016 08/31/14   Melvyn Novas, MD    No Known Allergies  Patient Active Problem List   Diagnosis Date Noted  . 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 Migraine without aura and without status migrainosus, not intractable 10/10/2014  . Migraine with aura and without status migrainosus, not intractable 08/29/2014  . 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 classified elsewhere 08/29/2014  . 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 Snorings 07/10/2014  . Sleep related headaches 07/10/2014  .  Nocturia more than twice per night 07/10/2014  . Preventative health care 07/30/2012  . Morbid obesity (HCC) 07/30/2012    Past Medical History:  Diagnosis Date  . Anxiety   . Asthma   . Depression   . GERD (gastroesophageal reflux disease)   . Heel spur    bilat  . History of bronchitis   . History of chicken pox   . History of urinary tract infection   . Migraines   . Plantar fasciitis    bilat  . STD (sexually transmitted disease)    chl hx & hsv 1&2  . Tremors of nervous system   . Urinary incontinence     Past Surgical History:  Procedure Laterality Date  . LAPAROSCOPIC GASTRIC SLEEVE RESECTION N/A 11/27/2014   Procedure: LAPAROSCOPIC GASTRIC SLEEVE RESECTION WITH HIATAL HERNIA REPAIR UPPER ENDOSCOPY;  Surgeon: Luretha Murphy, MD;  Location: [facility];  Service: General;  Laterality: N/A;  . TONSILLECTOMY  1978    Social History   Social History  . Marital status: Single    Spouse name: N/A  . Number of children: N/A  . Years of education: 42   Occupational History  . [employer]   Social History Main Topics  . Smoking status: Former Smoker    Packs/day: 0.25    Years: 5.00    Types: Cigarettes    Quit date: 03/10/2012  . Smokeless tobacco: Never Used  . Alcohol use 0.0 oz/week     Comment: 1 a month  . Drug use: No  . Sexual activity: Yes    Partners: Male    Birth control/ protection: Condom   Other Topics Concern  . Not on file   Social History Narrative   Regular exercise-no   Caffeine Use-yes, 1-2 cups of caffeine daily    Family History  Problem Relation Age of Onset  . Diabetes Mother   . Hypertension Mother   . Hyperlipidemia Mother   . Kidney disease Mother   . Cancer Father     pancreatic  . Hypertension Maternal Grandmother   . Diabetes Maternal Grandmother   . Heart failure Maternal Grandmother     CHF  . Heart attack Maternal Grandfather   . Hypertension Maternal Grandfather   . Hypertension Paternal  Grandmother   . Alzheimer's disease Paternal Grandmother   . Hypertension Paternal Grandfather   . Cancer Maternal Uncle     melanoma  . Cancer Paternal Uncle     kidney     Review of Systems  Constitutional: Negative for chills and fever.  HENT: Positive for sinus pain. Negative for congestion, ear pain and sore throat.   Eyes: Negative for discharge and redness.  Respiratory: Positive for cough, sputum production and wheezing. Negative for hemoptysis and shortness of breath.   Cardiovascular: Negative for chest pain, palpitations and leg swelling.  Gastrointestinal: Negative for abdominal pain, diarrhea, nausea and vomiting.  Genitourinary: Negative for dysuria and hematuria.  Musculoskeletal:  Negative for joint pain and neck pain.  Skin: Negative for rash.  Neurological: Negative for dizziness, weakness and headaches.  All other systems reviewed and are negative.   Vitals:   04/17/16 1057  BP: 118/80  Pulse: 85  Temp: 98.6 F (37 C)    Physical Exam  Constitutional: She is oriented to person, place, and time. She appears well-developed and well-nourished.  HENT:  Head: Normocephalic and atraumatic.  Nose: Nose normal.  Mouth/Throat: Oropharynx is clear and moist. No oropharyngeal exudate.  Eyes: Conjunctivae and EOM are normal. Pupils are equal, round, and reactive to light.  Neck: Normal range of motion. Neck supple. No JVD present. No thyromegaly present.  Cardiovascular: Normal rate, regular rhythm and normal heart sounds.   Pulmonary/Chest: Effort normal. She has wheezes (diffuse and bilateral). She has no rales.  Abdominal: She exhibits no distension. There is no tenderness.  Musculoskeletal: Normal range of motion.  Lymphadenopathy:    She has no cervical adenopathy.  Neurological: She is alert and oriented to person, place, and time. No sensory deficit. She exhibits normal muscle tone.  Skin: Skin is warm and dry. Capillary refill takes less than 2 seconds.    Psychiatric: She has a normal mood and affect. Her behavior is normal.  Vitals reviewed.    ASSESSMENT & PLAN: [name] was seen today for cough and sinus problem.  Diagnoses and all orders for this visit:  Bronchospasm with bronchitis, acute  Cough  History of asthma  Other orders -     predniSONE (DELTASONE) 20 MG tablet; Take 2 tablets (40 mg total) by mouth daily with breakfast. -     fluconazole (DIFLUCAN) 150 MG tablet; Take 1 tablet (150 mg total) by mouth once. -     azithromycin (ZITHROMAX) 250 MG tablet; Sig as indicated -     benzonatate (TESSALON) 200 MG capsule; Take 1 capsule (200 mg total) by mouth 2 (two) times daily as needed for cough.    Patient Instructions       IF you received an x-ray today, you will [facility] an [facility]. Please contact [facility] at [phone] with questions or concerns regarding your invoice.   IF you received labwork today, you will receive an invoice from [facility]. Please contact [facility] at [phone] with questions or concerns regarding your invoice.   Our billing staff will not be able to assist you with questions regarding bills from these companies.  You will be contacted with the lab results as soon as they are available. The fastest way to get your results is to activate your My Chart account. Instructions are located on the last page of this paperwork. If you have not heard from us regarding the results in 2 weeks, please contact this office.         Acute Bronchitis, Adult Acute bronchitis is when air tubes (bronchi) in the lungs suddenly get swollen. The condition can make it hard to breathe. It can also cause these symptoms:  A cough.  Coughing up clear, yellow, or green mucus.  Wheezing.  Chest congestion.  Shortness of breath.  A fever.  Body aches.  Chills.  A sore throat. Follow these instructions at home: Medicines  Take over-the-counter and prescription  medicines only as told by your doctor.  If you were prescribed an antibiotic medicine, take it as told by your doctor. Do not stop taking the antibiotic even if you start to feel better. General instructions  Rest.  Drink enough fluids  to keep your pee (urine) clear or pale yellow.  Avoid smoking and secondhand smoke. If you smoke and you need help quitting, ask your doctor. Quitting will help your lungs heal faster.  Use an inhaler, cool mist vaporizer, or humidifier as told by your doctor.  Keep all follow-up visits as told by your doctor. This is important. How is this prevented? To lower your risk of getting this condition again:  Wash your hands often with soap and water. If you cannot use soap and water, use hand sanitizer.  Avoid contact with people who have cold symptoms.  Try not to touch your hands to your mouth, nose, or eyes.  Make sure to get the flu shot every year. Contact a doctor if:  Your symptoms do not get better in 2 weeks. Get help right away if:  You cough up blood.  You have chest pain.  You have very bad shortness of breath.  You become dehydrated.  You faint (pass out) or keep feeling like you are going to pass out.  You keep throwing up (vomiting).  You have a very bad headache.  Your fever or chills gets worse. This information is not intended to replace advice given to you by your health care provider. Make sure you discuss any questions you have with your health care provider. Document Released: 08/13/2007 Document Revised: 10/03/2015 Document Reviewed: 08/15/2015 Elsevier Interactive Patient Education  2017 Elsevier Inc.     Edwina Barth, MD [facility]

## 2016-04-17 NOTE — Patient Instructions (Addendum)
     IF you received an x-ray today, you will receive an invoice from [facility]. Please contact [facility] at [phone] with questions or concerns regarding your invoice.   IF you received labwork today, you will receive an invoice from [facility]. Please contact [facility] at [phone] with questions or concerns regarding your invoice.   Our billing staff will not be able to assist you with questions regarding bills from these companies.  You will be contacted with the lab results as soon as they are available. The fastest way to get your results is to activate your My Chart account. Instructions are located on the last page of this paperwork. If you have not heard from us regarding the results in 2 weeks, please contact this office.      Acute Bronchitis, Adult Acute bronchitis is when air tubes (bronchi) in the lungs suddenly get swollen. The condition can make it hard to breathe. It can also cause these symptoms:  A cough.  Coughing up clear, yellow, or green mucus.  Wheezing.  Chest congestion.  Shortness of breath.  A fever.  Body aches.  Chills.  A sore throat.  Follow these instructions at home: Medicines  Take over-the-counter and prescription medicines only as told by your doctor.  If you were prescribed an antibiotic medicine, take it as told by your doctor. Do not stop taking the antibiotic even if you start to feel better. General instructions  Rest.  Drink enough fluids to keep your pee (urine) clear or pale yellow.  Avoid smoking and secondhand smoke. If you smoke and you need help quitting, ask your doctor. Quitting will help your lungs heal faster.  Use an inhaler, cool mist vaporizer, or humidifier as told by your doctor.  Keep all follow-up visits as told by your doctor. This is important. How is this prevented? To lower your risk of getting this condition again:  Wash your hands often with soap and water. If you cannot  use soap and water, use hand sanitizer.  Avoid contact with people who have cold symptoms.  Try not to touch your hands to your mouth, nose, or eyes.  Make sure to get the flu shot every year.  Contact a doctor if:  Your symptoms do not get better in 2 weeks. Get help right away if:  You cough up blood.  You have chest pain.  You have very bad shortness of breath.  You become dehydrated.  You faint (pass out) or keep feeling like you are going to pass out.  You keep throwing up (vomiting).  You have a very bad headache.  Your fever or chills gets worse. This information is not intended to replace advice given to you by your health care provider. Make sure you discuss any questions you have with your health care provider. Document Released: 08/13/2007 Document Revised: 10/03/2015 Document Reviewed: 08/15/2015 Elsevier Interactive Patient Education  2017 Elsevier Inc.  

## 2016-07-28 ENCOUNTER — Ambulatory Visit (INDEPENDENT_AMBULATORY_CARE_PROVIDER_SITE_OTHER): Payer: BLUE CROSS/BLUE SHIELD | Admitting: Emergency Medicine

## 2016-07-28 ENCOUNTER — Encounter: Payer: Self-pay | Admitting: Emergency Medicine

## 2016-07-28 VITALS — BP 135/86 | HR 72 | Temp 98.2°F | Resp 16 | Ht 67.0 in | Wt 268.8 lb

## 2016-07-28 DIAGNOSIS — R300 Dysuria: Secondary | ICD-10-CM | POA: Diagnosis not present

## 2016-07-28 DIAGNOSIS — B999 Unspecified infectious disease: Secondary | ICD-10-CM

## 2016-07-28 LAB — POCT URINALYSIS DIP (MANUAL ENTRY)
Bilirubin, UA: NEGATIVE
Blood, UA: NEGATIVE
Glucose, UA: NEGATIVE mg/dL
Ketones, POC UA: NEGATIVE mg/dL
Leukocytes, UA: NEGATIVE
Nitrite, UA: NEGATIVE
Protein Ur, POC: NEGATIVE mg/dL
Spec Grav, UA: <=1.005 — AB (ref 1.010–1.025)
Urobilinogen, UA: 0.2 E.U./dL
pH, UA: 6 (ref 5.0–8.0)

## 2016-07-28 MED ORDER — FLUCONAZOLE 150 MG PO TABS: 150.0000 mg | ORAL_TABLET | Freq: Once | ORAL | 0 refills | Status: AC

## 2016-07-28 MED ORDER — NITROFURANTOIN MONOHYD MACRO 100 MG PO CAPS: 100.0000 mg | ORAL_CAPSULE | Freq: Two times a day (BID) | ORAL | 0 refills | Status: AC

## 2016-07-28 NOTE — Patient Instructions (Addendum)
   IF you received an x-ray today, you will receive an invoice from [facility]. Please contact [facility] at [phone] with questions or concerns regarding your invoice.   IF you received labwork today, you will receive an invoice from [facility]. Please contact [facility] at [phone] with questions or concerns regarding your invoice.   Our billing staff will not be able to assist you with questions regarding bills from these companies.  You will be contacted with the lab results as soon as they are available. The fastest way to get your results is to activate your My Chart account. Instructions are located on the last page of this paperwork. If you have not heard from us regarding the results in 2 weeks, please contact this office.     Urinary Frequency, Adult Urinary frequency means urinating more often than usual. People with urinary frequency urinate at least 8 times in 24 hours, even if they drink a normal amount of fluid. Although they urinate more often than normal, the total amount of urine produced in a day may be normal. Urinary frequency is also called pollakiuria. What are the causes? This condition may be caused by:  A urinary tract infection.  Obesity.  Bladder problems, such as bladder stones.  Caffeine or alcohol.  Eating food or drinking fluids that irritate the bladder. These include coffee, tea, soda, artificial sweeteners, citrus, tomato-based foods, and chocolate.  Certain medicines, such as medicines that help the body get rid of extra fluid (diuretics).  Muscle or nerve weakness.  Overactive bladder.  Chronic diabetes.  Interstitial cystitis.  In men, problems with the prostate, such as an enlarged prostate.  In women, pregnancy. In some cases, the cause may not be known. What increases the risk? This condition is more likely to develop in:  Women who have gone through menopause.  Men with prostate problems.  People with  a disease or injury that affects the nerves or spinal cord.  People who have or have had a condition that affects the brain, such as a stroke. What are the signs or symptoms? Symptoms of this condition include:  Feeling an urgent need to urinate often. The stress and anxiety of needing to find a bathroom quickly can make this urge worse.  Urinating 8 or more times in 24 hours.  Urinating as often as every 1 to 2 hours. How is this diagnosed? This condition is diagnosed based on your symptoms, your medical history, and a physical exam. You may have tests, such as:  Blood tests.  Urine tests.  Imaging tests, such as X-rays or ultrasounds.  A bladder test.  A test of your neurological system. This is the body system that senses the need to urinate.  A test to check for problems in the urethra and bladder called cystoscopy. You may also be asked to keep a bladder diary. A bladder diary is a record of what you eat and drink, how often you urinate, and how much you urinate. You may need to see a health care provider who specializes in conditions of the urinary tract (urologist) or kidneys (nephrologist). How is this treated? Treatment for this condition depends on the cause. Sometimes the condition goes away on its own and treatment is not necessary. If treatment is needed, it may include:  Taking medicine.  Learning exercises that strengthen the muscles that help control urination.  Following a bladder training program. This may include:  Learning to delay going to the bathroom.  Double urinating (  voiding). This helps if you are not completely emptying your bladder.  Scheduled voiding.  Making diet changes, such as:  Avoiding caffeine.  Drinking fewer fluids, especially alcohol.  Not drinking in the evening.  Not having foods or drinks that may irritate the bladder.  Eating foods that help prevent or ease constipation. Constipation can make this condition  worse.  Having the nerves in your bladder stimulated. There are two options for stimulating the nerves to your bladder:  Outpatient electrical nerve stimulation. This is done by your health care provider.  Surgery to implant a bladder pacemaker. The pacemaker helps to control the urge to urinate. Follow these instructions at home:  Keep a bladder diary if told to by your health care provider.  Take over-the-counter and prescription medicines only as told by your health care provider.  Do any exercises as told by your health care provider.  Follow a bladder training program as told by your health care provider.  Make any recommended diet changes.  Keep all follow-up visits as told by your health care provider. This is important. Contact a health care provider if:  You start urinating more often.  You feel pain or irritation when you urinate.  You notice blood in your urine.  Your urine looks cloudy.  You develop a fever.  You begin vomiting. Get help right away if:  You are unable to urinate. This information is not intended to replace advice given to you by your health care provider. Make sure you discuss any questions you have with your health care provider. Document Released: 12/21/2008 Document Revised: 03/28/2015 Document Reviewed: 09/20/2014 Elsevier Interactive Patient Education  2017 Elsevier Inc.  

## 2016-07-28 NOTE — Progress Notes (Signed)
Alyssa Blanchard 46 y.o.   Chief Complaint  Patient presents with  . Dysuria    per patient, began several days ago    HISTORY OF PRESENT ILLNESS: This is a 46 y.o. female complaining of 2-3 days h/o dysuria and urinary frequency.  HPI   Prior to Admission medications   Medication Sig Start Date End Date Taking? Authorizing Provider  acetaminophen (TYLENOL) 500 MG tablet Take 1,000 mg by mouth every 6 (six) hours as needed for mild pain.   Yes [provider]  albuterol (PROVENTIL HFA;VENTOLIN HFA) 108 (90 BASE) MCG/ACT inhaler Inhale 2 puffs into the lungs every 6 (six) hours as needed. 07/09/13  Yes Elvina Sidle, MD  ALPRAZolam [name]) 1 MG tablet TAKE 1/2 TO 1 TABLET BY MOUTH EVERY 12 HOURS AS NEEDED (FILLABLE 01/24/16) 01/16/16  Yes [provider]  ARIPiprazole (ABILIFY) 15 MG tablet Take 20 mg by mouth at bedtime.    Yes [provider]  buPROPion (WELLBUTRIN XL) 300 MG 24 hr tablet Take 300 mg by mouth every morning.  08/09/14  Yes [provider]  famotidine (PEPCID) 20 MG tablet Take 20 mg by mouth 2 (two) times daily.   Yes [provider]  fluticasone (FLONASE) 50 MCG/ACT nasal spray Place 2 sprays into both nostrils daily. 03/05/16  Yes Brendia Dampier, Eilleen Kempf, MD  Multiple Vitamin (MULTIVITAMIN WITH MINERALS) TABS tablet Take 1 tablet by mouth daily.   Yes [provider]  Multiple Vitamins-Calcium (VIACTIV MULTI-VITAMIN) CHEW Chew 1 tablet by mouth daily.   Yes [provider]  pantoprazole (PROTONIX) 40 MG tablet Take 40 mg by mouth daily.   Yes [provider]  promethazine (PHENERGAN) 12.5 MG tablet 1 (ONE) TABLET BY MOUTH EVERY FOUR HOURS, AS NEEDED 12/03/15  Yes [provider]  SUMAtriptan (IMITREX) 100 MG tablet ! Tab at onset of HA. May repeat in 2 hours if headache persists or recurs.No more than 4 doses a month. 08/29/14  Yes [name], MD  amoxicillin-clavulanate (AUGMENTIN) 875-125  MG tablet Take 1 tablet by mouth 2 (two) times daily. Patient not taking: Reported on 07/28/2016 03/05/16   [name], MD  azithromycin [facility]) 250 MG tablet Sig as indicated Patient not taking: Reported on 07/28/2016 04/17/16   [name], MD  benzonatate (TESSALON) 200 MG capsule Take 1 capsule (200 mg total) by mouth 2 (two) times daily as needed for cough. Patient not taking: Reported on 07/28/2016 04/17/16   [name], MD  benztropine (COGENTIN) 0.5 MG tablet  08/20/15   [provider]  hydrOXYzine (VISTARIL) 25 MG capsule  08/09/15   [provider]  ketoprofen (ORUDIS) 50 MG capsule Take 1 capsule (50 mg total) by mouth every 6 (six) hours as needed. For headache Patient not taking: Reported on 07/28/2016 10/10/14   Dohmeier, Porfirio Mylar, MD  LORazepam (ATIVAN) 1 MG tablet Take 1 mg by mouth 2 (two) times daily.  08/10/14   [provider]  Lurasidone HCl (LATUDA) 60 MG TABS Take 1 tablet by mouth at bedtime. Reported on 05/23/2015    [provider]  rOPINIRole (REQUIP) 0.25 MG tablet Take 1 tablet (0.25 mg total) by mouth 3 (three) times daily. Patient not taking: Reported on 07/28/2016 10/10/14   Dohmeier, Porfirio Mylar, MD  topiramate (TOPAMAX) 25 MG tablet Take 1 tablet (25 mg total) by mouth 2 (two) times daily. Patient not taking: Reported on 07/28/2016 08/31/14   Dohmeier, Porfirio Mylar, MD    No Known Allergies  Patient Active Problem List   Diagnosis Date Noted  . History of asthma 04/17/2016  . Bronchospasm with bronchitis, acute 04/17/2016  . 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 Migraine without aura and without status migrainosus, not intractable 10/10/2014  . Migraine with aura and without status migrainosus, not intractable 08/29/2014  . 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 classified elsewhere 08/29/2014  . 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 Snorings 07/10/2014  . Sleep related headaches  07/10/2014  . Nocturia more than twice per night 07/10/2014  . Preventative health care 07/30/2012  . Morbid obesity (HCC) 07/30/2012    Past Medical History:  Diagnosis Date  . Anxiety   . Asthma   . Depression   . GERD (gastroesophageal reflux disease)   . Heel spur    bilat  . History of bronchitis   . History of chicken pox   . History of urinary tract infection   . Migraines   . Plantar fasciitis    bilat  . STD (sexually transmitted disease)    chl hx & hsv 1&2  . Tremors of nervous system   . Urinary incontinence     Past Surgical History:  Procedure Laterality Date  . LAPAROSCOPIC GASTRIC SLEEVE RESECTION N/A 11/27/2014   Procedure: LAPAROSCOPIC GASTRIC SLEEVE RESECTION WITH HIATAL HERNIA REPAIR UPPER ENDOSCOPY;  Surgeon: Luretha MurphyMatthew Martin, MD;  Location: [facility];  Service: General;  Laterality: N/A;  . TONSILLECTOMY  1978    Social History   Social History  . Marital status: Single    Spouse name: N/A  . Number of children: N/A  . Years of education: 4012   Occupational History  . [employer]   Social History Main Topics  . Smoking status: Former Smoker    Packs/day: 0.25    Years: 5.00    Types: Cigarettes    Quit date: 03/10/2012  . Smokeless tobacco: Never Used  . Alcohol use 0.0 oz/week     Comment: 1 a month  . Drug use: No  . Sexual activity: Yes    Partners: Male    Birth control/ protection: Condom   Other Topics Concern  . Not on file   Social History Narrative   Regular exercise-no   Caffeine Use-yes, 1-2 cups of caffeine daily    Family History  Problem Relation Age of Onset  . Diabetes Mother   . Hypertension Mother   . Hyperlipidemia Mother   . Kidney disease Mother   . Cancer Father        pancreatic  . Hypertension Maternal Grandmother   . Diabetes Maternal Grandmother   . Heart failure Maternal Grandmother        CHF  . Heart attack Maternal Grandfather   . Hypertension Maternal Grandfather   .  Hypertension Paternal Grandmother   . Alzheimer's disease Paternal Grandmother   . Hypertension Paternal Grandfather   . Cancer Maternal Uncle        melanoma  . Cancer Paternal Uncle        kidney     Review of Systems  Constitutional: Negative for chills and fever.  HENT: Negative.   Eyes: Negative.   Respiratory: Negative.  Negative for shortness of breath.   Cardiovascular: Negative.  Negative for chest pain.  Gastrointestinal: Negative for diarrhea, nausea and vomiting.  Genitourinary: Positive for dysuria, frequency and urgency.  Musculoskeletal: Negative.   Skin: Negative for rash.  Neurological: Negative.  Negative for dizziness and headaches.  Endo/Heme/Allergies: Negative.  All other systems reviewed and are negative.  Vitals:   07/28/16 1559  BP: 135/86  Pulse: 72  Resp: 16  Temp: 98.2 F (36.8 C)    Physical Exam  Constitutional: She is oriented to person, place, and time. She appears well-developed and well-nourished.  HENT:  Head: Normocephalic and atraumatic.  Mouth/Throat: Oropharynx is clear and moist.  Eyes: Conjunctivae and EOM are normal. Pupils are equal, round, and reactive to light.  Neck: Normal range of motion. Neck supple. No JVD present. No thyromegaly present.  Cardiovascular: Normal rate and regular rhythm.   Pulmonary/Chest: Effort normal and breath sounds normal.  Abdominal: Soft. Bowel sounds are normal. She exhibits no distension. There is no tenderness.  Musculoskeletal: Normal range of motion.  Lymphadenopathy:    She has no cervical adenopathy.  Neurological: She is alert and oriented to person, place, and time. No sensory deficit. She exhibits normal muscle tone.  Skin: Skin is warm and dry. Capillary refill takes less than 2 seconds.  Psychiatric: She has a normal mood and affect. Her behavior is normal.  Vitals reviewed.  Results for orders placed or performed in visit on 07/28/16 (from the past 24 hour(s))  POCT urinalysis  dipstick     Status: Abnormal   Collection Time: 07/28/16  4:14 PM  Result Value Ref Range   Color, UA yellow yellow   Clarity, UA clear clear   Glucose, UA negative negative mg/dL   Bilirubin, UA negative negative   Ketones, POC UA negative negative mg/dL   Spec Grav, UA <=7.829 (A) 1.010 - 1.025   Blood, UA negative negative   pH, UA 6.0 5.0 - 8.0   Protein Ur, POC negative negative mg/dL   Urobilinogen, UA 0.2 0.2 or 1.0 E.U./dL   Nitrite, UA Negative Negative   Leukocytes, UA Negative Negative     ASSESSMENT & PLAN: Alyssa Blanchard was seen today for dysuria.  Diagnoses and all orders for this visit:  Dysuria -     Urine culture -     POCT urinalysis dipstick  Clinical infection  Other orders -     nitrofurantoin, macrocrystal-monohydrate, (MACROBID) 100 MG capsule; Take 1 capsule (100 mg total) by mouth 2 (two) times daily. -     fluconazole (DIFLUCAN) 150 MG tablet; Take 1 tablet (150 mg total) by mouth once.    Patient Instructions       IF you received an x-ray today, you will receive an invoice from [facility]. Please contact [facility] at [phone] with questions or concerns regarding your invoice.   IF you received labwork today, you will receive an invoice from [facility]. Please contact [facility] at [phone] with questions or concerns regarding your invoice.   Our billing staff will not be able to assist you with questions regarding bills from these companies.  You will be contacted with the lab results as soon as they are available. The fastest way to get your results is to activate your My Chart account. Instructions are located on the last page of this paperwork. If you have not heard from [place] regarding the results in 2 weeks, please contact this office.     Urinary Frequency, Adult Urinary frequency means urinating more often than usual. People with urinary frequency urinate at least 8 times in 24 hours, even if they drink a normal  amount of fluid. Although they urinate more often than normal, the total amount of urine produced in a day may be normal. Urinary frequency is also called  pollakiuria. What are the causes? This condition may be caused by:  A urinary tract infection.  Obesity.  Bladder problems, such as bladder stones.  Caffeine or alcohol.  Eating food or drinking fluids that irritate the bladder. These include coffee, tea, soda, artificial sweeteners, citrus, tomato-based foods, and chocolate.  Certain medicines, such as medicines that help the body get rid of extra fluid (diuretics).  Muscle or nerve weakness.  Overactive bladder.  Chronic diabetes.  Interstitial cystitis.  In men, problems with the prostate, such as an enlarged prostate.  In women, pregnancy. In some cases, the cause may not be known. What increases the risk? This condition is more likely to develop in:  Women who have gone through menopause.  Men with prostate problems.  People with a disease or injury that affects the nerves or spinal cord.  People who have or have had a condition that affects the brain, such as a stroke. What are the signs or symptoms? Symptoms of this condition include:  Feeling an urgent need to urinate often. The stress and anxiety of needing to find a bathroom quickly can make this urge worse.  Urinating 8 or more times in 24 hours.  Urinating as often as every 1 to 2 hours. How is this diagnosed? This condition is diagnosed based on your symptoms, your medical history, and a physical exam. You may have tests, such as:  Blood tests.  Urine tests.  Imaging tests, such as X-rays or ultrasounds.  A bladder test.  A test of your neurological system. This is the body system that senses the need to urinate.  A test to check for problems in the urethra and bladder called cystoscopy. You may also be asked to keep a bladder diary. A bladder diary is a record of what you eat and drink, how  often you urinate, and how much you urinate. You may need to see a health care provider who specializes in conditions of the urinary tract (urologist) or kidneys (nephrologist). How is this treated? Treatment for this condition depends on the cause. Sometimes the condition goes away on its own and treatment is not necessary. If treatment is needed, it may include:  Taking medicine.  Learning exercises that strengthen the muscles that help control urination.  Following a bladder training program. This may include:  Learning to delay going to the bathroom.  Double urinating (voiding). This helps if you are not completely emptying your bladder.  Scheduled voiding.  Making diet changes, such as:  Avoiding caffeine.  Drinking fewer fluids, especially alcohol.  Not drinking in the evening.  Not having foods or drinks that may irritate the bladder.  Eating foods that help prevent or ease constipation. Constipation can make this condition worse.  Having the nerves in your bladder stimulated. There are two options for stimulating the nerves to your bladder:  Outpatient electrical nerve stimulation. This is done by your health care provider.  Surgery to implant a bladder pacemaker. The pacemaker helps to control the urge to urinate. Follow these instructions at home:  Keep a bladder diary if told to by your health care provider.  Take over-the-counter and prescription medicines only as told by your health care provider.  Do any exercises as told by your health care provider.  Follow a bladder training program as told by your health care provider.  Make any recommended diet changes.  Keep all follow-up visits as told by your health care provider. This is important. Contact a health care provider  if:  You start urinating more often.  You feel pain or irritation when you urinate.  You notice blood in your urine.  Your urine looks cloudy.  You develop a fever.  You begin  vomiting. Get help right away if:  You are unable to urinate. This information is not intended to replace advice given to you by your health care provider. Make sure you discuss any questions you have with your health care provider. Document Released: 12/21/2008 Document Revised: 03/28/2015 Document Reviewed: 09/20/2014 Elsevier Interactive Patient Education  2017 [employer]      Edwina Barth, MD [facility]

## 2016-07-29 LAB — URINE CULTURE: Organism ID, Bacteria: NO GROWTH

## 2016-08-22 ENCOUNTER — Encounter: Payer: Self-pay | Admitting: Emergency Medicine

## 2016-08-22 ENCOUNTER — Ambulatory Visit (INDEPENDENT_AMBULATORY_CARE_PROVIDER_SITE_OTHER): Payer: BLUE CROSS/BLUE SHIELD | Admitting: Emergency Medicine

## 2016-08-22 VITALS — BP 152/91 | HR 69 | Temp 98.0°F | Resp 18 | Ht 67.0 in | Wt 281.0 lb

## 2016-08-22 DIAGNOSIS — Z0000 Encounter for general adult medical examination without abnormal findings: Secondary | ICD-10-CM | POA: Diagnosis not present

## 2016-08-22 NOTE — Progress Notes (Addendum)
Alyssa Blanchard 46 y.o.   Chief Complaint  Patient presents with  . Annual Exam    no pap    HISTORY OF PRESENT ILLNESS: This is a 46 y.o. female has no complaints; UTD with PAP and mammograms; no medical concerns.  HPI   Prior to Admission medications   Medication Sig Start Date End Date Taking? Authorizing Provider  albuterol (PROVENTIL HFA;VENTOLIN HFA) 108 (90 BASE) MCG/ACT inhaler Inhale 2 puffs into the lungs every 6 (six) hours as needed. 07/09/13  Yes Robyn Haber, MD  ALPRAZolam Duanne Moron) 1 MG tablet TAKE 1/2 TO 1 TABLET BY MOUTH EVERY 12 HOURS AS NEEDED (FILLABLE 01/24/16) 01/16/16  Yes [provider]  buPROPion (WELLBUTRIN XL) 300 MG 24 hr tablet Take 300 mg by mouth every morning.  08/09/14  Yes [provider]  famotidine (PEPCID) 20 MG tablet Take 20 mg by mouth 2 (two) times daily.   Yes [provider]  fluticasone (FLONASE) 50 MCG/ACT nasal spray Place 2 sprays into both nostrils daily. 03/05/16  Yes Geet Hosking, Ines Bloomer, MD  Multiple Vitamin (MULTIVITAMIN WITH MINERALS) TABS tablet Take 1 tablet by mouth daily.   Yes [provider]  pantoprazole (PROTONIX) 40 MG tablet Take 40 mg by mouth daily.   Yes [provider]  promethazine (PHENERGAN) 12.5 MG tablet 1 (ONE) TABLET BY MOUTH EVERY FOUR HOURS, AS NEEDED 12/03/15  Yes [provider]  SUMAtriptan (IMITREX) 100 MG tablet ! Tab at onset of HA. May repeat in 2 hours if headache persists or recurs.No more than 4 doses a month. 08/29/14  Yes Leandrew Koyanagi, MD  acetaminophen (TYLENOL) 500 MG tablet Take 1,000 mg by mouth every 6 (six) hours as needed for mild pain.    [provider]  ARIPiprazole (ABILIFY) 15 MG tablet Take 20 mg by mouth at bedtime.     [provider]  azithromycin (ZITHROMAX) 250 MG tablet Sig as indicated Patient not taking: Reported on 07/28/2016 04/17/16   Horald Pollen, MD  benztropine (COGENTIN) 0.5 MG tablet  08/20/15    [provider]    No Known Allergies  Patient Active Problem List   Diagnosis Date Noted  . Dysuria 07/28/2016  . Clinical infection 07/28/2016  . History of asthma 04/17/2016  . Bronchospasm with bronchitis, acute 04/17/2016  . 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 Migraine without aura and without status migrainosus, not intractable 10/10/2014  . Migraine with aura and without status migrainosus, not intractable 08/29/2014  . 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 classified elsewhere 08/29/2014  . 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 Snorings 07/10/2014  . Sleep related headaches 07/10/2014  . Nocturia more than twice per night 07/10/2014  . Preventative health care 07/30/2012  . Morbid obesity ([place]) 07/30/2012    Past Medical History:  Diagnosis Date  . Anxiety   . Asthma   . Depression   . GERD (gastroesophageal reflux disease)   . Heel spur    bilat  . History of bronchitis   . History of chicken pox   . History of urinary tract infection   . Migraines   . Plantar fasciitis    bilat  . STD (sexually transmitted disease)    chl hx & hsv 1&2  . Tremors of nervous system   . Urinary incontinence     Past Surgical History:  Procedure Laterality Date  . LAPAROSCOPIC GASTRIC SLEEVE RESECTION N/A 11/27/2014   Procedure: LAPAROSCOPIC GASTRIC SLEEVE RESECTION WITH HIATAL  HERNIA REPAIR UPPER ENDOSCOPY;  Surgeon: Johnathan Hausen, MD;  Location: [facility];  Service: General;  Laterality: N/A;  . TONSILLECTOMY  1978    Social History   Social History  . Marital status: Single    Spouse name: N/A  . Number of children: N/A  . Years of education: 82   Occupational History  . [employer]   Social History Main Topics  . Smoking status: Former Smoker    Packs/day: 0.25    Years: 5.00    Types: Cigarettes    Quit date: 03/10/2012  . Smokeless tobacco: Never Used  . Alcohol use 0.0 oz/week     Comment: 1 a  month  . Drug use: No  . Sexual activity: Yes    Partners: Male    Birth control/ protection: Condom   Other Topics Concern  . Not on file   Social History Narrative   Regular exercise-no   Caffeine Use-yes, 1-2 cups of caffeine daily    Family History  Problem Relation Age of Onset  . Diabetes Mother   . Hypertension Mother   . Hyperlipidemia Mother   . Kidney disease Mother   . Cancer Father        pancreatic  . Hypertension Maternal Grandmother   . Diabetes Maternal Grandmother   . Heart failure Maternal Grandmother        CHF  . Heart attack Maternal Grandfather   . Hypertension Maternal Grandfather   . Hypertension Paternal Grandmother   . Alzheimer's disease Paternal Grandmother   . Hypertension Paternal Grandfather   . Cancer Maternal Uncle        melanoma  . Cancer Paternal Uncle        kidney     Review of Systems  Constitutional: Negative.  Negative for chills, fever and weight loss.  HENT: Negative for congestion, ear discharge, ear pain, nosebleeds and sore throat.        Muffled ears, intermittent.  Eyes: Negative.  Negative for blurred vision, double vision, discharge and redness.  Respiratory: Negative.  Negative for cough, hemoptysis and shortness of breath.   Cardiovascular: Negative.  Negative for chest pain, palpitations and leg swelling.  Gastrointestinal: Negative.  Negative for abdominal pain, blood in stool, constipation, diarrhea, melena, nausea and vomiting.  Genitourinary: Negative.  Negative for dysuria, flank pain and hematuria.  Musculoskeletal: Negative.  Negative for myalgias and neck pain.  Skin: Negative.  Negative for rash.  Neurological: Negative.  Negative for dizziness, sensory change, focal weakness and headaches.  Endo/Heme/Allergies: Negative.   Psychiatric/Behavioral: Negative for depression. The patient is not nervous/anxious.   All other systems reviewed and are negative.   Vitals:   08/22/16 0813 08/22/16 0843  BP:  (!) 150/90 (!) 152/91  Pulse: 69   Resp: 18   Temp: 98 F (36.7 C)     Physical Exam  Constitutional: She is oriented to person, place, and time. She appears well-developed and well-nourished.  HENT:  Head: Normocephalic and atraumatic.  Right Ear: Tympanic membrane, external ear and ear canal normal.  Left Ear: Tympanic membrane, external ear and ear canal normal.  Nose: Nose normal.  Mouth/Throat: Oropharynx is clear and moist. No oropharyngeal exudate.  Eyes: Conjunctivae and EOM are normal. Pupils are equal, round, and reactive to light.  Neck: Normal range of motion. Neck supple. No JVD present.  Cardiovascular: Normal rate, regular rhythm, normal heart sounds and intact distal pulses.   Pulmonary/Chest: Effort normal and breath sounds normal.  Abdominal: Soft. Bowel sounds are normal. There is no tenderness.  Musculoskeletal: Normal range of motion. She exhibits no edema or tenderness.  Lymphadenopathy:    She has no cervical adenopathy.  Neurological: She is alert and oriented to person, place, and time. No sensory deficit. She exhibits normal muscle tone.  Skin: Skin is warm and dry. Capillary refill takes less than 2 seconds.  Psychiatric: She has a normal mood and affect. Her behavior is normal.  Vitals reviewed.    ASSESSMENT & PLAN: Fusako was seen today for annual exam.  Diagnoses and all orders for this visit:  Routine general medical examination at a health care facility -     CBC with Differential -     Comprehensive metabolic panel -     Hemoglobin A1c -     Lipid panel -     TSH -     HIV antibody   Patient Instructions   We recommend that you schedule a mammogram for breast cancer screening. Typically, you do not need a referral to do this. Please contact a local imaging center to schedule your mammogram.  [facility] - [phone]  *ask for the Radiology Department [facility]) - [phone] or [phone]   [facility] - [phone] [phone] [facility] - [phone]  *ask for the [facility] - [phone]  *ask for the Radiology Department [facility] - [phone]  *ask for the [facility] - [phone]    IF you received an x-ray today, you will receive an invoice from [facility]. Please contact [facility] Radiology at [phone] with questions or concerns regarding your invoice.   IF you received labwork today, you will receive an invoice from [facility]. Please contact [facility] at [phone] with questions or concerns regarding your invoice.   Our billing staff will not be able to assist you with questions regarding bills from these companies.  You will be contacted with the lab results as soon as they are available. The fastest way to get your results is to activate your My Chart account. Instructions are located on the last page of this paperwork. If you have not heard from [place] regarding the results in 2 weeks, please contact this office.      Health Maintenance, Female Adopting a healthy lifestyle and getting preventive care can go a long way to promote health and wellness. Talk with your health care provider about what schedule of regular examinations is right for you. This is a good chance for you to check in with your provider about disease prevention and staying healthy. In between checkups, there are plenty of things you can do on your own. Experts have done a lot of research about which lifestyle changes and preventive measures are most likely to keep you healthy. Ask your health care provider for more information. Weight and diet Eat a healthy diet  Be sure to include plenty of vegetables, fruits, low-fat dairy products, and lean protein.  Do not eat a lot of foods high in solid fats, added sugars, or salt.  Get regular  exercise. This is one of the most important things you can do for your health. ? Most adults should exercise for at least 150 minutes each week. The exercise should increase your heart rate and make you sweat (moderate-intensity exercise). ? Most adults should also do strengthening exercises at least twice a  week. This is in addition to the moderate-intensity exercise.  Maintain a healthy weight  Body mass index (BMI) is a measurement that can be used to identify possible weight problems. It estimates body fat based on height and weight. Your health care provider can help determine your BMI and help you achieve or maintain a healthy weight.  For females 73 years of age and older: ? A BMI below 18.5 is considered underweight. ? A BMI of 18.5 to 24.9 is normal. ? A BMI of 25 to 29.9 is considered overweight. ? A BMI of 30 and above is considered obese.  Watch levels of cholesterol and blood lipids  You should start having your blood tested for lipids and cholesterol at 46 years of age, then have this test every 5 years.  You may need to have your cholesterol levels checked more often if: ? Your lipid or cholesterol levels are high. ? You are older than 46 years of age. ? You are at high risk for heart disease.  Cancer screening Lung Cancer  Lung cancer screening is recommended for adults 19-51 years old who are at high risk for lung cancer because of a history of smoking.  A yearly low-dose CT scan of the lungs is recommended for people who: ? Currently smoke. ? Have quit within the past 15 years. ? Have at least a 30-pack-year history of smoking. A pack year is smoking an average of one pack of cigarettes a day for 1 year.  Yearly screening should continue until it has been 15 years since you quit.  Yearly screening should stop if you develop a health problem that would prevent you from having lung cancer treatment.  Breast Cancer  Practice breast self-awareness. This means  understanding how your breasts normally appear and feel.  It also means doing regular breast self-exams. Let your health care provider know about any changes, no matter how small.  If you are in your 20s or 30s, you should have a clinical breast exam (CBE) by a health care provider every 1-3 years as part of a regular health exam.  If you are 41 or older, have a CBE every year. Also consider having a breast X-ray (mammogram) every year.  If you have a family history of breast cancer, talk to your health care provider about genetic screening.  If you are at high risk for breast cancer, talk to your health care provider about having an MRI and a mammogram every year.  Breast cancer gene (BRCA) assessment is recommended for women who have family members with BRCA-related cancers. BRCA-related cancers include: ? Breast. ? Ovarian. ? Tubal. ? Peritoneal cancers.  Results of the assessment will determine the need for genetic counseling and BRCA1 and BRCA2 testing.  Cervical Cancer Your health care provider may recommend that you be screened regularly for cancer of the pelvic organs (ovaries, uterus, and vagina). This screening involves a pelvic examination, including checking for microscopic changes to the surface of your cervix (Pap test). You may be encouraged to have this screening done every 3 years, beginning at age 43.  For women ages 16-65, health care providers may recommend pelvic exams and Pap testing every 3 years, or they may recommend the Pap and pelvic exam, combined with testing for human papilloma virus (HPV), every 5 years. Some types of HPV increase your risk of cervical cancer. Testing for HPV may also be done on women of any age with unclear Pap test results.  Other health care  providers may not recommend any screening for nonpregnant women who are considered low risk for pelvic cancer and who do not have symptoms. Ask your health care provider if a screening pelvic exam is  right for you.  If you have had past treatment for cervical cancer or a condition that could lead to cancer, you need Pap tests and screening for cancer for at least 20 years after your treatment. If Pap tests have been discontinued, your risk factors (such as having a new sexual partner) need to be reassessed to determine if screening should resume. Some women have medical problems that increase the chance of getting cervical cancer. In these cases, your health care provider may recommend more frequent screening and Pap tests.  Colorectal Cancer  This type of cancer can be detected and often prevented.  Routine colorectal cancer screening usually begins at 46 years of age and continues through 46 years of age.  Your health care provider may recommend screening at an earlier age if you have risk factors for colon cancer.  Your health care provider may also recommend using home test kits to check for hidden blood in the stool.  A small camera at the end of a tube can be used to examine your colon directly (sigmoidoscopy or colonoscopy). This is done to check for the earliest forms of colorectal cancer.  Routine screening usually begins at age 61.  Direct examination of the colon should be repeated every 5-10 years through 46 years of age. However, you may need to be screened more often if early forms of precancerous polyps or small growths are found.  Skin Cancer  Check your skin from head to toe regularly.  Tell your health care provider about any new moles or changes in moles, especially if there is a change in a mole's shape or color.  Also tell your health care provider if you have a mole that is larger than the size of a pencil eraser.  Always use sunscreen. Apply sunscreen liberally and repeatedly throughout the day.  Protect yourself by wearing long sleeves, pants, a wide-brimmed hat, and sunglasses whenever you are outside.  Heart disease, diabetes, and high blood  pressure  High blood pressure causes heart disease and increases the risk of stroke. High blood pressure is more likely to develop in: ? People who have blood pressure in the high end of the normal range (130-139/85-89 mm Hg). ? People who are overweight or obese. ? People who are African American.  If you are 94-27 years of age, have your blood pressure checked every 3-5 years. If you are 19 years of age or older, have your blood pressure checked every year. You should have your blood pressure measured twice-once when you are at a hospital or clinic, and once when you are not at a hospital or clinic. Record the average of the two measurements. To check your blood pressure when you are not at a hospital or clinic, you can use: ? An automated blood pressure machine at a pharmacy. ? A home blood pressure monitor.  If you are between 19 years and 72 years old, ask your health care provider if you should take aspirin to prevent strokes.  Have regular diabetes screenings. This involves taking a blood sample to check your fasting blood sugar level. ? If you are at a normal weight and have a low risk for diabetes, have this test once every three years after 46 years of age. ? If you are overweight and  have a high risk for diabetes, consider being tested at a younger age or more often. Preventing infection Hepatitis B  If you have a higher risk for hepatitis B, you should be screened for this virus. You are considered at high risk for hepatitis B if: ? You were born in a country where hepatitis B is common. Ask your health care provider which countries are considered high risk. ? Your parents were born in a high-risk country, and you have not been immunized against hepatitis B (hepatitis B vaccine). ? You have HIV or AIDS. ? You use needles to inject street drugs. ? You live with someone who has hepatitis B. ? You have had sex with someone who has hepatitis B. ? You get hemodialysis  treatment. ? You take certain medicines for conditions, including cancer, organ transplantation, and autoimmune conditions.  Hepatitis C  Blood testing is recommended for: ? Everyone born from [birth year] through 1965. ? Anyone with known risk factors for hepatitis C.  Sexually transmitted infections (STIs)  You should be screened for sexually transmitted infections (STIs) including gonorrhea and chlamydia if: ? You are sexually active and are younger than 46 years of age. ? You are older than 46 years of age and your health care provider tells you that you are at risk for this type of infection. ? Your sexual activity has changed since you were last screened and you are at an increased risk for chlamydia or gonorrhea. Ask your health care provider if you are at risk.  If you do not have HIV, but are at risk, it may be recommended that you take a prescription medicine daily to prevent HIV infection. This is called pre-exposure prophylaxis (PrEP). You are considered at risk if: ? You are sexually active and do not regularly use condoms or know the HIV status of your partner(s). ? You take drugs by injection. ? You are sexually active with a partner who has HIV.  Talk with your health care provider about whether you are at high risk of being infected with HIV. If you choose to begin PrEP, you should first be tested for HIV. You should then be tested every 3 months for as long as you are taking PrEP. Pregnancy  If you are premenopausal and you may become pregnant, ask your health care provider about preconception counseling.  If you may become pregnant, take 400 to 800 micrograms (mcg) of folic acid every day.  If you want to prevent pregnancy, talk to your health care provider about birth control (contraception). Osteoporosis and menopause  Osteoporosis is a disease in which the bones lose minerals and strength with aging. This can result in serious bone fractures. Your risk for osteoporosis  can be identified using a bone density scan.  If you are 51 years of age or older, or if you are at risk for osteoporosis and fractures, ask your health care provider if you should be screened.  Ask your health care provider whether you should take a calcium or vitamin D supplement to lower your risk for osteoporosis.  Menopause may have certain physical symptoms and risks.  Hormone replacement therapy may reduce some of these symptoms and risks. Talk to your health care provider about whether hormone replacement therapy is right for you. Follow these instructions at home:  Schedule regular health, dental, and eye exams.  Stay current with your immunizations.  Do not use any tobacco products including cigarettes, chewing tobacco, or electronic cigarettes.  If you are pregnant,  do not drink alcohol.  If you are breastfeeding, limit how much and how often you drink alcohol.  Limit alcohol intake to no more than 1 drink per day for nonpregnant women. One drink equals 12 ounces of beer, 5 ounces of wine, or 1 ounces of hard liquor.  Do not use street drugs.  Do not share needles.  Ask your health care provider for help if you need support or information about quitting drugs.  Tell your health care provider if you often feel depressed.  Tell your health care provider if you have ever been abused or do not feel safe at home. This information is not intended to replace advice given to you by your health care provider. Make sure you discuss any questions you have with your health care provider. Document Released: 09/09/2010 Document Revised: 08/02/2015 Document Reviewed: 11/28/2014 Elsevier Interactive Patient Education  2018 [place] (AHA) Exercise Recommendation  Being physically active is important to prevent heart disease and stroke, the nation's No. 1and No. 5killers. To improve overall cardiovascular health, we suggest at least 150 minutes per  week of moderate exercise or 75 minutes per week of vigorous exercise (or a combination of moderate and vigorous activity). Thirty minutes a day, five times a week is an easy goal to remember. You will also experience benefits even if you divide your time into two or three segments of 10 to 15 minutes per day.  For people who would benefit from lowering their blood pressure or cholesterol, we recommend 40 minutes of aerobic exercise of moderate to vigorous intensity three to four times a week to lower the risk for heart attack and stroke.  Physical activity is anything that makes you move your body and burn calories.  This includes things like climbing stairs or playing sports. Aerobic exercises benefit your heart, and include walking, jogging, swimming or biking. Strength and stretching exercises are best for overall stamina and flexibility.  The simplest, positive change you can make to effectively improve your heart health is to start walking. It's enjoyable, free, easy, social and great exercise. A walking program is flexible and boasts high success rates because people can stick with it. It's easy for walking to become a regular and satisfying part of life.   For Overall Cardiovascular Health:  At least 30 minutes of moderate-intensity aerobic activity at least 5 days per week for a total of 150  OR   At least 25 minutes of vigorous aerobic activity at least 3 days per week for a total of 75 minutes; or a combination of moderate- and vigorous-intensity aerobic activity  AND   Moderate- to high-intensity muscle-strengthening activity at least 2 days per week for additional health benefits.  For Lowering Blood Pressure and Cholesterol  An average 40 minutes of moderate- to vigorous-intensity aerobic activity 3 or 4 times per week  What if I can't make it to the time goal? Something is always better than nothing! And everyone has to start somewhere. Even if you've been sedentary for  years, today is the day you can begin to make healthy changes in your life. If you don't think you'll make it for 30 or 40 minutes, set a reachable goal for today. You can work up toward your overall goal by increasing your time as you get stronger. Don't let all-or-nothing thinking rob you of doing what you can every day.  Source:[email or web address].[email or web address] Pop, MD [facility]

## 2016-08-22 NOTE — Patient Instructions (Addendum)
We recommend that you schedule a mammogram for breast cancer screening. Typically, you do not need a referral to do this. Please contact a local imaging center to schedule your mammogram.  [facility] - [phone]  *ask for the Radiology Department [facility] - [phone] or [phone]  [facility] - [phone] [place] [phone] [facility] - [phone]  *ask for the [facility] - [phone]  *ask for the Radiology Department [facility] - [phone]  *ask for the [place] - [phone]    IF you received an x-ray today, you will receive an invoice from [facility]. Please contact [facility] at [phone] with questions or concerns regarding your invoice.   IF you received labwork today, you will receive an invoice from [place]. Please contact [facility] at [phone] with questions or concerns regarding your invoice.   Our billing staff will not be able to assist you with questions regarding bills from these companies.  You will be contacted with the lab results as soon as they are available. The fastest way to get your results is to activate your My Chart account. Instructions are located on the last page of this paperwork. If you have not heard from [place] regarding the results in 2 weeks, please contact this office.      Health Maintenance, Female Adopting a healthy lifestyle and getting preventive care can go a long way to promote health and wellness. Talk with your health care provider about what schedule of regular examinations is right for you. This is a good chance for you to check in with your provider about disease prevention and staying healthy. In between checkups, there are plenty of things you can do on your own. Experts have done a lot of research about which lifestyle  changes and preventive measures are most likely to keep you healthy. Ask your health care provider for more information. Weight and diet Eat a healthy diet  Be sure to include plenty of vegetables, fruits, low-fat dairy products, and lean protein.  Do not eat a lot of foods high in solid fats, added sugars, or salt.  Get regular exercise. This is one of the most important things you can do for your health. ? Most adults should exercise for at least 150 minutes each week. The exercise should increase your heart rate and make you sweat (moderate-intensity exercise). ? Most adults should also do strengthening exercises at least twice a week. This is in addition to the moderate-intensity exercise.  Maintain a healthy weight  Body mass index (BMI) is a measurement that can be used to identify possible weight problems. It estimates body fat based on height and weight. Your health care provider can help determine your BMI and help you achieve or maintain a healthy weight.  For females 42 years of age and older: ? A BMI below 18.5 is considered underweight. ? A BMI of 18.5 to 24.9 is normal. ? A BMI of 25 to 29.9 is considered overweight. ? A BMI of 30 and above is considered obese.  Watch levels of cholesterol and blood lipids  You should start having your blood tested for lipids and cholesterol at 46 years of age, then have this test every 5 years.  You may need to have your cholesterol levels checked more often if: ? Your lipid or cholesterol levels are high. ? You are older than 46  years of age. ? You are at high risk for heart disease.  Cancer screening Lung Cancer  Lung cancer screening is recommended for adults 79-13 years old who are at high risk for lung cancer because of a history of smoking.  A yearly low-dose CT scan of the lungs is recommended for people who: ? Currently smoke. ? Have quit within the past 15 years. ? Have at least a 30-pack-year history of smoking. A pack  year is smoking an average of one pack of cigarettes a day for 1 year.  Yearly screening should continue until it has been 15 years since you quit.  Yearly screening should stop if you develop a health problem that would prevent you from having lung cancer treatment.  Breast Cancer  Practice breast self-awareness. This means understanding how your breasts normally appear and feel.  It also means doing regular breast self-exams. Let your health care provider know about any changes, no matter how small.  If you are in your 20s or 30s, you should have a clinical breast exam (CBE) by a health care provider every 1-3 years as part of a regular health exam.  If you are 53 or older, have a CBE every year. Also consider having a breast X-ray (mammogram) every year.  If you have a family history of breast cancer, talk to your health care provider about genetic screening.  If you are at high risk for breast cancer, talk to your health care provider about having an MRI and a mammogram every year.  Breast cancer gene (BRCA) assessment is recommended for women who have family members with BRCA-related cancers. BRCA-related cancers include: ? Breast. ? Ovarian. ? Tubal. ? Peritoneal cancers.  Results of the assessment will determine the need for genetic counseling and BRCA1 and BRCA2 testing.  Cervical Cancer Your health care provider may recommend that you be screened regularly for cancer of the pelvic organs (ovaries, uterus, and vagina). This screening involves a pelvic examination, including checking for microscopic changes to the surface of your cervix (Pap test). You may be encouraged to have this screening done every 3 years, beginning at age 46.  For women ages 22-65, health care providers may recommend pelvic exams and Pap testing every 3 years, or they may recommend the Pap and pelvic exam, combined with testing for human papilloma virus (HPV), every 5 years. Some types of HPV increase  your risk of cervical cancer. Testing for HPV may also be done on women of any age with unclear Pap test results.  Other health care providers may not recommend any screening for nonpregnant women who are considered low risk for pelvic cancer and who do not have symptoms. Ask your health care provider if a screening pelvic exam is right for you.  If you have had past treatment for cervical cancer or a condition that could lead to cancer, you need Pap tests and screening for cancer for at least 20 years after your treatment. If Pap tests have been discontinued, your risk factors (such as having a new sexual partner) need to be reassessed to determine if screening should resume. Some women have medical problems that increase the chance of getting cervical cancer. In these cases, your health care provider may recommend more frequent screening and Pap tests.  Colorectal Cancer  This type of cancer can be detected and often prevented.  Routine colorectal cancer screening usually begins at 46 years of age and continues through 46 years of age.  Your health care  provider may recommend screening at an earlier age if you have risk factors for colon cancer.  Your health care provider may also recommend using home test kits to check for hidden blood in the stool.  A small camera at the end of a tube can be used to examine your colon directly (sigmoidoscopy or colonoscopy). This is done to check for the earliest forms of colorectal cancer.  Routine screening usually begins at age 45.  Direct examination of the colon should be repeated every 5-10 years through 46 years of age. However, you may need to be screened more often if early forms of precancerous polyps or small growths are found.  Skin Cancer  Check your skin from head to toe regularly.  Tell your health care provider about any new moles or changes in moles, especially if there is a change in a mole's shape or color.  Also tell your health  care provider if you have a mole that is larger than the size of a pencil eraser.  Always use sunscreen. Apply sunscreen liberally and repeatedly throughout the day.  Protect yourself by wearing long sleeves, pants, a wide-brimmed hat, and sunglasses whenever you are outside.  Heart disease, diabetes, and high blood pressure  High blood pressure causes heart disease and increases the risk of stroke. High blood pressure is more likely to develop in: ? People who have blood pressure in the high end of the normal range (130-139/85-89 mm Hg). ? People who are overweight or obese. ? People who are African American.  If you are 78-11 years of age, have your blood pressure checked every 3-5 years. If you are 3 years of age or older, have your blood pressure checked every year. You should have your blood pressure measured twice-once when you are at a hospital or clinic, and once when you are not at a hospital or clinic. Record the average of the two measurements. To check your blood pressure when you are not at a hospital or clinic, you can use: ? An automated blood pressure machine at a pharmacy. ? A home blood pressure monitor.  If you are between 56 years and 41 years old, ask your health care provider if you should take aspirin to prevent strokes.  Have regular diabetes screenings. This involves taking a blood sample to check your fasting blood sugar level. ? If you are at a normal weight and have a low risk for diabetes, have this test once every three years after 46 years of age. ? If you are overweight and have a high risk for diabetes, consider being tested at a younger age or more often. Preventing infection Hepatitis B  If you have a higher risk for hepatitis B, you should be screened for this virus. You are considered at high risk for hepatitis B if: ? You were born in a country where hepatitis B is common. Ask your health care provider which countries are considered high risk. ? Your  parents were born in a high-risk country, and you have not been immunized against hepatitis B (hepatitis B vaccine). ? You have HIV or AIDS. ? You use needles to inject street drugs. ? You live with someone who has hepatitis B. ? You have had sex with someone who has hepatitis B. ? You get hemodialysis treatment. ? You take certain medicines for conditions, including cancer, organ transplantation, and autoimmune conditions.  Hepatitis C  Blood testing is recommended for: ? Everyone born from [birth year] through 1965. ? Anyone  with known risk factors for hepatitis C.  Sexually transmitted infections (STIs)  You should be screened for sexually transmitted infections (STIs) including gonorrhea and chlamydia if: ? You are sexually active and are younger than 46 years of age. ? You are older than 46 years of age and your health care provider tells you that you are at risk for this type of infection. ? Your sexual activity has changed since you were last screened and you are at an increased risk for chlamydia or gonorrhea. Ask your health care provider if you are at risk.  If you do not have HIV, but are at risk, it may be recommended that you take a prescription medicine daily to prevent HIV infection. This is called pre-exposure prophylaxis (PrEP). You are considered at risk if: ? You are sexually active and do not regularly use condoms or know the HIV status of your partner(s). ? You take drugs by injection. ? You are sexually active with a partner who has HIV.  Talk with your health care provider about whether you are at high risk of being infected with HIV. If you choose to begin PrEP, you should first be tested for HIV. You should then be tested every 3 months for as long as you are taking PrEP. Pregnancy  If you are premenopausal and you may become pregnant, ask your health care provider about preconception counseling.  If you may become pregnant, take 400 to 800 micrograms (mcg) of folic  acid every day.  If you want to prevent pregnancy, talk to your health care provider about birth control (contraception). Osteoporosis and menopause  Osteoporosis is a disease in which the bones lose minerals and strength with aging. This can result in serious bone fractures. Your risk for osteoporosis can be identified using a bone density scan.  If you are 10 years of age or older, or if you are at risk for osteoporosis and fractures, ask your health care provider if you should be screened.  Ask your health care provider whether you should take a calcium or vitamin D supplement to lower your risk for osteoporosis.  Menopause may have certain physical symptoms and risks.  Hormone replacement therapy may reduce some of these symptoms and risks. Talk to your health care provider about whether hormone replacement therapy is right for you. Follow these instructions at home:  Schedule regular health, dental, and eye exams.  Stay current with your immunizations.  Do not use any tobacco products including cigarettes, chewing tobacco, or electronic cigarettes.  If you are pregnant, do not drink alcohol.  If you are breastfeeding, limit how much and how often you drink alcohol.  Limit alcohol intake to no more than 1 drink per day for nonpregnant women. One drink equals 12 ounces of beer, 5 ounces of wine, or 1 ounces of hard liquor.  Do not use street drugs.  Do not share needles.  Ask your health care provider for help if you need support or information about quitting drugs.  Tell your health care provider if you often feel depressed.  Tell your health care provider if you have ever been abused or do not feel safe at home. This information is not intended to replace advice given to you by your health care provider. Make sure you discuss any questions you have with your health care provider. Document Released: 09/09/2010 Document Revised: 08/02/2015 Document Reviewed:  11/28/2014 Elsevier Interactive Patient Education  2018 [place] [employer] [place] [place]) Exercise Recommendation  Being physically active is important to prevent heart disease and stroke, the nation's No. 1and No. 5killers. To improve overall cardiovascular health, we suggest at least 150 minutes per week of moderate exercise or 75 minutes per week of vigorous exercise (or a combination of moderate and vigorous activity). Thirty minutes a day, five times a week is an easy goal to remember. You will also experience benefits even if you divide your time into two or three segments of 10 to 15 minutes per day.  For people who would benefit from lowering their blood pressure or cholesterol, we recommend 40 minutes of aerobic exercise of moderate to vigorous intensity three to four times a week to lower the risk for heart attack and stroke.  Physical activity is anything that makes you move your body and burn calories.  This includes things like climbing stairs or playing sports. Aerobic exercises benefit your heart, and include walking, jogging, swimming or biking. Strength and stretching exercises are best for overall stamina and flexibility.  The simplest, positive change you can make to effectively improve your heart health is to start walking. It's enjoyable, free, easy, social and great exercise. A walking program is flexible and boasts high success rates because people can stick with it. It's easy for walking to become a regular and satisfying part of life.   For Overall Cardiovascular Health:  At least 30 minutes of moderate-intensity aerobic activity at least 5 days per week for a total of 150  OR   At least 25 minutes of vigorous aerobic activity at least 3 days per week for a total of 75 minutes; or a combination of moderate- and vigorous-intensity aerobic activity  AND   Moderate- to high-intensity muscle-strengthening activity at least 2 days per week for additional  health benefits.  For Lowering Blood Pressure and Cholesterol  An average 40 minutes of moderate- to vigorous-intensity aerobic activity 3 or 4 times per week  What if I can't make it to the time goal? Something is always better than nothing! And everyone has to start somewhere. Even if you've been sedentary for years, today is the day you can begin to make healthy changes in your life. If you don't think you'll make it for 30 or 40 minutes, set a reachable goal for today. You can work up toward your overall goal by increasing your time as you get stronger. Don't let all-or-nothing thinking rob you of doing what you can every day.  Source:http://www.heart.org

## 2016-08-23 LAB — COMPREHENSIVE METABOLIC PANEL
ALT: 15 IU/L (ref 0–32)
AST: 15 IU/L (ref 0–40)
Albumin/Globulin Ratio: 1.7 (ref 1.2–2.2)
Albumin: 4.5 g/dL (ref 3.5–5.5)
Alkaline Phosphatase: 76 IU/L (ref 39–117)
BUN/Creatinine Ratio: 11 (ref 9–23)
BUN: 10 mg/dL (ref 6–24)
Bilirubin Total: 0.4 mg/dL (ref 0.0–1.2)
CO2: 21 mmol/L (ref 20–29)
Calcium: 9.1 mg/dL (ref 8.7–10.2)
Chloride: 101 mmol/L (ref 96–106)
Creatinine, Ser: 0.95 mg/dL (ref 0.57–1.00)
GFR calc Af Amer: 84 mL/min/{1.73_m2} (ref >59)
GFR calc non Af Amer: 73 mL/min/{1.73_m2} (ref >59)
Globulin, Total: 2.6 g/dL (ref 1.5–4.5)
Glucose: 89 mg/dL (ref 65–99)
Potassium: 4.2 mmol/L (ref 3.5–5.2)
Sodium: 141 mmol/L (ref 134–144)
Total Protein: 7.1 g/dL (ref 6.0–8.5)

## 2016-08-23 LAB — CBC WITH DIFFERENTIAL/PLATELET
Basophils Absolute: 0 10*3/uL (ref 0.0–0.2)
Basos: 0 %
EOS (ABSOLUTE): 0.1 10*3/uL (ref 0.0–0.4)
Eos: 1 %
Hematocrit: 43.6 % (ref 34.0–46.6)
Hemoglobin: 14.4 g/dL (ref 11.1–15.9)
Immature Grans (Abs): 0 10*3/uL (ref 0.0–0.1)
Immature Granulocytes: 0 %
Lymphocytes Absolute: 1.2 10*3/uL (ref 0.7–3.1)
Lymphs: 19 %
MCH: 30.1 pg (ref 26.6–33.0)
MCHC: 33 g/dL (ref 31.5–35.7)
MCV: 91 fL (ref 79–97)
Monocytes Absolute: 0.4 10*3/uL (ref 0.1–0.9)
Monocytes: 7 %
Neutrophils Absolute: 4.7 10*3/uL (ref 1.4–7.0)
Neutrophils: 73 %
Platelets: 269 10*3/uL (ref 150–379)
RBC: 4.79 x10E6/uL (ref 3.77–5.28)
RDW: 13.7 % (ref 12.3–15.4)
WBC: 6.5 10*3/uL (ref 3.4–10.8)

## 2016-08-23 LAB — LIPID PANEL
Chol/HDL Ratio: 2.3 ratio (ref 0.0–4.4)
Cholesterol, Total: 166 mg/dL (ref 100–199)
HDL: 71 mg/dL (ref >39)
LDL Calculated: 79 mg/dL (ref 0–99)
Triglycerides: 80 mg/dL (ref 0–149)
VLDL Cholesterol Cal: 16 mg/dL (ref 5–40)

## 2016-08-23 LAB — HIV ANTIBODY (ROUTINE TESTING W REFLEX): HIV Screen 4th Generation wRfx: NONREACTIVE

## 2016-08-23 LAB — TSH: TSH: 2.5 u[IU]/mL (ref 0.450–4.500)

## 2016-08-23 LAB — HEMOGLOBIN A1C
Est. average glucose Bld gHb Est-mCnc: 105 mg/dL
Hgb A1c MFr Bld: 5.3 % (ref 4.8–5.6)

## 2016-08-25 ENCOUNTER — Encounter: Payer: Self-pay | Admitting: Radiology

## 2016-12-31 ENCOUNTER — Encounter: Payer: Self-pay | Admitting: Emergency Medicine

## 2016-12-31 ENCOUNTER — Ambulatory Visit (INDEPENDENT_AMBULATORY_CARE_PROVIDER_SITE_OTHER): Payer: BLUE CROSS/BLUE SHIELD | Admitting: Emergency Medicine

## 2016-12-31 VITALS — BP 130/68 | HR 89 | Temp 99.1°F | Resp 16 | Ht 67.0 in | Wt 292.0 lb

## 2016-12-31 DIAGNOSIS — J209 Acute bronchitis, unspecified: Secondary | ICD-10-CM | POA: Diagnosis not present

## 2016-12-31 DIAGNOSIS — Z8709 Personal history of other diseases of the respiratory system: Secondary | ICD-10-CM | POA: Diagnosis not present

## 2016-12-31 DIAGNOSIS — R05 Cough: Secondary | ICD-10-CM

## 2016-12-31 DIAGNOSIS — R059 Cough, unspecified: Secondary | ICD-10-CM | POA: Insufficient documentation

## 2016-12-31 MED ORDER — BENZONATATE 200 MG PO CAPS: 200.0000 mg | ORAL_CAPSULE | Freq: Two times a day (BID) | ORAL | 0 refills | Status: DC | PRN

## 2016-12-31 MED ORDER — PREDNISONE 20 MG PO TABS: 40.0000 mg | ORAL_TABLET | Freq: Every day | ORAL | 0 refills | Status: AC

## 2016-12-31 MED ORDER — AZITHROMYCIN 250 MG PO TABS: ORAL_TABLET | ORAL | 0 refills | Status: DC

## 2016-12-31 NOTE — Patient Instructions (Addendum)
     IF you received an x-ray today, you will receive an invoice from [facility]. Please contact [facility] at [phone] with questions or concerns regarding your invoice.   IF you received labwork today, you will receive an invoice from [facility]. Please contact [facility] at [phone] with questions or concerns regarding your invoice.   Our billing staff will not be able to assist you with questions regarding bills from these companies.  You will be contacted with the lab results as soon as they are available. The fastest way to get your results is to activate your My Chart account. Instructions are located on the last page of this paperwork. If you have not heard from us regarding the results in 2 weeks, please contact this office.      Acute Bronchitis, Adult Acute bronchitis is when air tubes (bronchi) in the lungs suddenly get swollen. The condition can make it hard to breathe. It can also cause these symptoms:  A cough.  Coughing up clear, yellow, or green mucus.  Wheezing.  Chest congestion.  Shortness of breath.  A fever.  Body aches.  Chills.  A sore throat.  Follow these instructions at home: Medicines  Take over-the-counter and prescription medicines only as told by your doctor.  If you were prescribed an antibiotic medicine, take it as told by your doctor. Do not stop taking the antibiotic even if you start to feel better. General instructions  Rest.  Drink enough fluids to keep your pee (urine) clear or pale yellow.  Avoid smoking and secondhand smoke. If you smoke and you need help quitting, ask your doctor. Quitting will help your lungs heal faster.  Use an inhaler, cool mist vaporizer, or humidifier as told by your doctor.  Keep all follow-up visits as told by your doctor. This is important. How is this prevented? To lower your risk of getting this condition again:  Wash your hands often with soap and water. If you cannot  use soap and water, use hand sanitizer.  Avoid contact with people who have cold symptoms.  Try not to touch your hands to your mouth, nose, or eyes.  Make sure to get the flu shot every year.  Contact a doctor if:  Your symptoms do not get better in 2 weeks. Get help right away if:  You cough up blood.  You have chest pain.  You have very bad shortness of breath.  You become dehydrated.  You faint (pass out) or keep feeling like you are going to pass out.  You keep throwing up (vomiting).  You have a very bad headache.  Your fever or chills gets worse. This information is not intended to replace advice given to you by your health care provider. Make sure you discuss any questions you have with your health care provider. Document Released: 08/13/2007 Document Revised: 10/03/2015 Document Reviewed: 08/15/2015 Elsevier Interactive Patient Education  2017 [employer]  

## 2016-12-31 NOTE — Progress Notes (Signed)
Alyssa Blanchard 46 y.o.   Chief Complaint  Patient presents with  . Cough    productive yellow mucus X 1 week  . Shortness of Breath    HISTORY OF PRESENT ILLNESS: This is a 46 y.o. female complaining of productive cough x 1 week; boyfriend also sick; has h/o asthma.  Cough  This is a new problem. The current episode started in the past 7 days. The problem has been gradually worsening. The problem occurs constantly. The cough is productive of sputum. Associated symptoms include chest pain (pleuritic), a fever, headaches, myalgias, nasal congestion, rhinorrhea, shortness of breath and wheezing. Pertinent negatives include no chills, ear pain, eye redness, hemoptysis, rash or sore throat. She has tried OTC cough suppressant and a beta-agonist inhaler for the symptoms. The treatment provided mild relief. Her past medical history is significant for asthma.  Shortness of Breath  Associated symptoms include chest pain (pleuritic), a fever, headaches, rhinorrhea, sputum production and wheezing. Pertinent negatives include no abdominal pain, ear pain, hemoptysis, rash, sore throat or vomiting. Her past medical history is significant for asthma.     Prior to Admission medications   Medication Sig Start Date End Date Taking? Authorizing Provider  acetaminophen (TYLENOL) 500 MG tablet Take 1,000 mg by mouth every 6 (six) hours as needed for mild pain.   Yes [provider]  albuterol (PROVENTIL HFA;VENTOLIN HFA) 108 (90 BASE) MCG/ACT inhaler Inhale 2 puffs into the lungs every 6 (six) hours as needed. 07/09/13  Yes Elvina Sidle, MD  ALPRAZolam [name]) 1 MG tablet TAKE 1/2 TO 1 TABLET BY MOUTH EVERY 12 HOURS AS NEEDED (FILLABLE 01/24/16) 01/16/16  Yes [provider]  ARIPiprazole (ABILIFY) 15 MG tablet Take 20 mg by mouth at bedtime.    Yes [provider]  buPROPion (WELLBUTRIN XL) 300 MG 24 hr tablet Take 300 mg by mouth every morning.  08/09/14  Yes [provider]    famotidine (PEPCID) 20 MG tablet Take 20 mg by mouth 2 (two) times daily.   Yes [provider]  Multiple Vitamin (MULTIVITAMIN WITH MINERALS) TABS tablet Take 1 tablet by mouth daily.   Yes [provider]  pantoprazole (PROTONIX) 40 MG tablet Take 40 mg by mouth daily.   Yes [provider]  promethazine (PHENERGAN) 12.5 MG tablet 1 (ONE) TABLET BY MOUTH EVERY FOUR HOURS, AS NEEDED 12/03/15  Yes [provider]  SUMAtriptan (IMITREX) 100 MG tablet ! Tab at onset of HA. May repeat in 2 hours if headache persists or recurs.No more than 4 doses a month. 08/29/14  Yes [name], MD  azithromycin [facility]) 250 MG tablet Sig as indicated Patient not taking: Reported on 07/28/2016 04/17/16   [name], MD  benztropine (COGENTIN) 0.5 MG tablet  08/20/15   [provider]  fluticasone (FLONASE) 50 MCG/ACT nasal spray Place 2 sprays into both nostrils daily. Patient not taking: Reported on 12/31/2016 03/05/16   [name], MD    No Known Allergies  Patient Active Problem List   Diagnosis Date Noted  . Dysuria 07/28/2016  . Clinical infection 07/28/2016  . History of asthma 04/17/2016  . Bronchospasm with bronchitis, acute 04/17/2016  . 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 Migraine without aura and without status migrainosus, not intractable 10/10/2014  . Migraine with aura and without status migrainosus, not intractable 08/29/2014  . 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 classified elsewhere 08/29/2014  . 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 Snorings 07/10/2014  .  Sleep related headaches 07/10/2014  . Nocturia more than twice per night 07/10/2014  . Preventative health care 07/30/2012  . Morbid obesity (HCC) 07/30/2012    Past Medical History:  Diagnosis Date  . Anxiety   . Asthma   . Depression   . GERD (gastroesophageal reflux disease)   . Heel spur    bilat  . History of  bronchitis   . History of chicken pox   . History of urinary tract infection   . Migraines   . Plantar fasciitis    bilat  . STD (sexually transmitted disease)    chl hx & hsv 1&2  . Tremors of nervous system   . Urinary incontinence     Past Surgical History:  Procedure Laterality Date  . LAPAROSCOPIC GASTRIC SLEEVE RESECTION N/A 11/27/2014   Procedure: LAPAROSCOPIC GASTRIC SLEEVE RESECTION WITH HIATAL HERNIA REPAIR UPPER ENDOSCOPY;  Surgeon: Luretha MurphyMatthew Martin, MD;  Location: [facility];  Service: General;  Laterality: N/A;  . TONSILLECTOMY  1978    Social History   Social History  . Marital status: Single    Spouse name: N/A  . Number of children: N/A  . Years of education: 3712   Occupational History  . [employer]   Social History Main Topics  . Smoking status: Former Smoker    Packs/day: 0.25    Years: 5.00    Types: Cigarettes    Quit date: 03/10/2012  . Smokeless tobacco: Never Used  . Alcohol use 0.0 oz/week     Comment: 1 a month  . Drug use: No  . Sexual activity: Yes    Partners: Male    Birth control/ protection: Condom   Other Topics Concern  . Not on file   Social History Narrative   Regular exercise-no   Caffeine Use-yes, 1-2 cups of caffeine daily    Family History  Problem Relation Age of Onset  . Diabetes Mother   . Hypertension Mother   . Hyperlipidemia Mother   . Kidney disease Mother   . Cancer Father        pancreatic  . Hypertension Maternal Grandmother   . Diabetes Maternal Grandmother   . Heart failure Maternal Grandmother        CHF  . Heart attack Maternal Grandfather   . Hypertension Maternal Grandfather   . Hypertension Paternal Grandmother   . Alzheimer's disease Paternal Grandmother   . Hypertension Paternal Grandfather   . Cancer Maternal Uncle        melanoma  . Cancer Paternal Uncle        kidney     Review of Systems  Constitutional: Positive for fever and malaise/fatigue. Negative for chills.  HENT:  Positive for congestion and rhinorrhea. Negative for ear pain and sore throat.   Eyes: Negative.  Negative for discharge and redness.  Respiratory: Positive for cough, sputum production, shortness of breath and wheezing. Negative for hemoptysis.   Cardiovascular: Positive for chest pain (pleuritic). Negative for palpitations.  Gastrointestinal: Negative for abdominal pain, blood in stool, diarrhea, melena, nausea and vomiting.  Genitourinary: Positive for dysuria. Negative for hematuria.  Musculoskeletal: Positive for myalgias.  Skin: Negative for rash.  Neurological: Positive for headaches. Negative for dizziness, sensory change and focal weakness.  Endo/Heme/Allergies: Negative.     Vitals:   12/31/16 1044  BP: 130/68  Pulse: 89  Resp: 16  Temp: 99.1 F (37.3 C)  SpO2: 98%    Physical Exam  Constitutional: She is oriented to person,  place, and time. She appears well-developed and well-nourished.  HENT:  Head: Normocephalic and atraumatic.  Right Ear: External ear normal.  Left Ear: External ear normal.  Nose: Nose normal.  Mouth/Throat: Oropharynx is clear and moist.  Eyes: Pupils are equal, round, and reactive to light. Conjunctivae and EOM are normal.  Neck: Normal range of motion. Neck supple. No JVD present. No thyromegaly present.  Cardiovascular: Normal rate, regular rhythm, normal heart sounds and intact distal pulses.   Pulmonary/Chest: Effort normal and breath sounds normal.  Abdominal: Soft. Bowel sounds are normal.  Musculoskeletal: Normal range of motion. She exhibits no edema or tenderness.  Lymphadenopathy:    She has no cervical adenopathy.  Neurological: She is alert and oriented to person, place, and time. No sensory deficit. She exhibits normal muscle tone.  Skin: Skin is warm and dry. Capillary refill takes less than 2 seconds.  Psychiatric: She has a normal mood and affect. Her behavior is normal.  Vitals reviewed.    ASSESSMENT & PLAN: Jamala was  seen today for cough and shortness of breath.  Diagnoses and all orders for this visit:  Acute bronchitis, unspecified organism -     azithromycin (ZITHROMAX) 250 MG tablet; Sig as indicated -     predniSONE (DELTASONE) 20 MG tablet; Take 2 tablets (40 mg total) by mouth daily with breakfast.  Cough -     benzonatate (TESSALON) 200 MG capsule; Take 1 capsule (200 mg total) by mouth 2 (two) times daily as needed for cough.  History of asthma -     predniSONE (DELTASONE) 20 MG tablet; Take 2 tablets (40 mg total) by mouth daily with breakfast.    Patient Instructions       IF you received an x-ray today, you will receive an invoice from [facility]. Please contact [facility] at [phone] with questions or concerns regarding your invoice.   IF you received labwork today, you will receive an invoice from [place]. Please contact [facility] at [phone] with questions or concerns regarding your invoice.   Our billing staff will not be able to assist you with questions regarding bills from these companies.  You will be contacted with the lab results as soon as they are available. The fastest way to get your results is to activate your My Chart account. Instructions are located on the last page of this paperwork. If you have not heard from [place] regarding the results in 2 weeks, please contact this office.     Acute Bronchitis, Adult Acute bronchitis is when air tubes (bronchi) in the lungs suddenly get swollen. The condition can make it hard to breathe. It can also cause these symptoms:  A cough.  Coughing up clear, yellow, or green mucus.  Wheezing.  Chest congestion.  Shortness of breath.  A fever.  Body aches.  Chills.  A sore throat.  Follow these instructions at home: Medicines  Take over-the-counter and prescription medicines only as told by your doctor.  If you were prescribed an antibiotic medicine, take it as told by your doctor. Do not  stop taking the antibiotic even if you start to feel better. General instructions  Rest.  Drink enough fluids to keep your pee (urine) clear or pale yellow.  Avoid smoking and secondhand smoke. If you smoke and you need help quitting, ask your doctor. Quitting will help your lungs heal faster.  Use an inhaler, cool mist vaporizer, or humidifier as told by your doctor.  Keep all follow-up visits as told  by your doctor. This is important. How is this prevented? To lower your risk of getting this condition again:  Wash your hands often with soap and water. If you cannot use soap and water, use hand sanitizer.  Avoid contact with people who have cold symptoms.  Try not to touch your hands to your mouth, nose, or eyes.  Make sure to get the flu shot every year.  Contact a doctor if:  Your symptoms do not get better in 2 weeks. Get help right away if:  You cough up blood.  You have chest pain.  You have very bad shortness of breath.  You become dehydrated.  You faint (pass out) or keep feeling like you are going to pass out.  You keep throwing up (vomiting).  You have a very bad headache.  Your fever or chills gets worse. This information is not intended to replace advice given to you by your health care provider. Make sure you discuss any questions you have with your health care provider. Document Released: 08/13/2007 Document Revised: 10/03/2015 Document Reviewed: 08/15/2015 Elsevier Interactive Patient Education  2017 [employer]      Edwina Barth, MD [facility]

## 2017-02-05 ENCOUNTER — Ambulatory Visit (INDEPENDENT_AMBULATORY_CARE_PROVIDER_SITE_OTHER): Payer: BLUE CROSS/BLUE SHIELD | Admitting: Emergency Medicine

## 2017-02-05 ENCOUNTER — Other Ambulatory Visit: Payer: Self-pay

## 2017-02-05 ENCOUNTER — Encounter: Payer: Self-pay | Admitting: Podiatry

## 2017-02-05 ENCOUNTER — Ambulatory Visit: Payer: BLUE CROSS/BLUE SHIELD | Admitting: Podiatry

## 2017-02-05 ENCOUNTER — Telehealth: Payer: Self-pay | Admitting: Emergency Medicine

## 2017-02-05 ENCOUNTER — Encounter: Payer: Self-pay | Admitting: Emergency Medicine

## 2017-02-05 VITALS — BP 114/84 | HR 86 | Temp 98.3°F | Resp 16 | Ht 66.5 in | Wt 305.4 lb

## 2017-02-05 DIAGNOSIS — Z124 Encounter for screening for malignant neoplasm of cervix: Secondary | ICD-10-CM | POA: Diagnosis not present

## 2017-02-05 DIAGNOSIS — M722 Plantar fascial fibromatosis: Secondary | ICD-10-CM

## 2017-02-05 DIAGNOSIS — Z23 Encounter for immunization: Secondary | ICD-10-CM

## 2017-02-05 MED ORDER — TRIAMCINOLONE ACETONIDE 10 MG/ML IJ SUSP
10.0000 mg | Freq: Once | INTRAMUSCULAR | Status: AC
Start: 2017-02-05 — End: 2017-02-05
  Administered 2017-02-05: 10 mg

## 2017-02-05 NOTE — Progress Notes (Signed)
Alyssa Blanchard 46 y.o.   Chief Complaint  Patient presents with  . Gynecologic Exam    per patient PAP SMEAR ONLY    HISTORY OF PRESENT ILLNESS: This is a 46 y.o. female here for GYN exam (pap smear).  HPI   Prior to Admission medications   Medication Sig Start Date End Date Taking? Authorizing Provider  acetaminophen (TYLENOL) 500 MG tablet Take 1,000 mg by mouth every 6 (six) hours as needed for mild pain.   Yes [provider]  albuterol (PROVENTIL HFA;VENTOLIN HFA) 108 (90 BASE) MCG/ACT inhaler Inhale 2 puffs into the lungs every 6 (six) hours as needed. 07/09/13  Yes Robyn Haber, MD  ALPRAZolam Duanne Moron) 1 MG tablet TAKE 1/2 TO 1 TABLET BY MOUTH EVERY 12 HOURS AS NEEDED (FILLABLE 01/24/16) 01/16/16  Yes [provider]  ARIPiprazole (ABILIFY) 15 MG tablet Take 20 mg by mouth at bedtime.    Yes [provider]  buPROPion (WELLBUTRIN XL) 300 MG 24 hr tablet Take 300 mg by mouth every morning.  08/09/14  Yes [provider]  fluticasone (FLONASE) 50 MCG/ACT nasal spray Place 2 sprays into both nostrils daily. 03/05/16  Yes Ysenia Filice, Ines Bloomer, MD  Multiple Vitamin (MULTIVITAMIN WITH MINERALS) TABS tablet Take 1 tablet by mouth daily.   Yes [provider]  pantoprazole (PROTONIX) 40 MG tablet Take 40 mg by mouth daily.   Yes [provider]  promethazine (PHENERGAN) 12.5 MG tablet 1 (ONE) TABLET BY MOUTH EVERY FOUR HOURS, AS NEEDED 12/03/15  Yes [provider]  SUMAtriptan (IMITREX) 100 MG tablet ! Tab at onset of HA. May repeat in 2 hours if headache persists or recurs.No more than 4 doses a month. 08/29/14  Yes Leandrew Koyanagi, MD  benztropine (COGENTIN) 0.5 MG tablet  08/20/15   [provider]  famotidine (PEPCID) 20 MG tablet Take 20 mg by mouth 2 (two) times daily.    [provider]    No Known Allergies  Patient Active Problem List   Diagnosis Date Noted  . Cough 12/31/2016  . Dysuria  07/28/2016  . Clinical infection 07/28/2016  . History of asthma 04/17/2016  . Acute bronchitis 04/17/2016  . 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 Migraine without aura and without status migrainosus, not intractable 10/10/2014  . Migraine with aura and without status migrainosus, not intractable 08/29/2014  . 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 classified elsewhere 08/29/2014  . 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 Snorings 07/10/2014  . Sleep related headaches 07/10/2014  . Nocturia more than twice per night 07/10/2014  . Preventative health care 07/30/2012  . Morbid obesity ([place]) 07/30/2012    Past Medical History:  Diagnosis Date  . Anxiety   . Asthma   . Depression   . GERD (gastroesophageal reflux disease)   . Heel spur    bilat  . History of bronchitis   . History of chicken pox   . History of urinary tract infection   . Migraines   . Plantar fasciitis    bilat  . STD (sexually transmitted disease)    chl hx & hsv 1&2  . Tremors of nervous system   . Urinary incontinence     Past Surgical History:  Procedure Laterality Date  . LAPAROSCOPIC GASTRIC SLEEVE RESECTION N/A 11/27/2014   Procedure: LAPAROSCOPIC GASTRIC SLEEVE RESECTION WITH HIATAL HERNIA REPAIR UPPER ENDOSCOPY;  Surgeon: Johnathan Hausen, MD;  Location: [facility];  Service: General;  Laterality: N/A;  . TONSILLECTOMY  1978    Social History   Socioeconomic History  . Marital status: Single    Spouse name: Not on file  . Number of children: Not on file  . Years of education: 24  . Highest education level: Not on file  Social Needs  . Financial resource strain: Not on file  . Food insecurity - worry: Not on file  . Food insecurity - inability: Not on file  . Transportation needs - medical: Not on file  . Transportation needs - non-medical: Not on file  Occupational History  . Occupation: Armed forces technical officer: [employer]  Tobacco Use  . Smoking status:  Former Smoker    Packs/day: 0.25    Years: 5.00    Pack years: 1.25    Types: Cigarettes    Last attempt to quit: 03/10/2012    Years since quitting: 4.9  . Smokeless tobacco: Never Used  Substance and Sexual Activity  . Alcohol use: Yes    Alcohol/week: 0.0 oz    Comment: 1 a month  . Drug use: No  . Sexual activity: Yes    Partners: Male    Birth control/protection: Condom  Other Topics Concern  . Not on file  Social History Narrative   Regular exercise-no   Caffeine Use-yes, 1-2 cups of caffeine daily    Family History  Problem Relation Age of Onset  . Diabetes Mother   . Hypertension Mother   . Hyperlipidemia Mother   . Kidney disease Mother   . Cancer Father        pancreatic  . Hypertension Maternal Grandmother   . Diabetes Maternal Grandmother   . Heart failure Maternal Grandmother        CHF  . Heart attack Maternal Grandfather   . Hypertension Maternal Grandfather   . Hypertension Paternal Grandmother   . Alzheimer's disease Paternal Grandmother   . Hypertension Paternal Grandfather   . Cancer Maternal Uncle        melanoma  . Cancer Paternal Uncle        kidney     Review of Systems  Constitutional: Negative.  Negative for chills and fever.  HENT: Negative.   Eyes: Negative.   Respiratory: Negative for cough and shortness of breath.   Cardiovascular: Negative for chest pain and palpitations.  Gastrointestinal: Negative for abdominal pain, diarrhea, nausea and vomiting.  Genitourinary: Negative for dysuria and hematuria.  Skin: Negative for rash.  Neurological: Negative.  Negative for dizziness and headaches.  Endo/Heme/Allergies: Negative.   All other systems reviewed and are negative.   Vitals:   02/05/17 0933  BP: 114/84  Pulse: 86  Resp: 16  Temp: 98.3 F (36.8 C)  SpO2: 97%    Physical Exam  Constitutional: She is oriented to person, place, and time. She appears well-developed and well-nourished.  HENT:  Head: Normocephalic and  atraumatic.  Eyes: Pupils are equal, round, and reactive to light.  Neck: Normal range of motion.  Pulmonary/Chest: Effort normal.  Abdominal: Soft.  Genitourinary: Vagina normal. There is no rash, tenderness or lesion on the right labia. There is no rash, tenderness or lesion on the left labia. Cervix exhibits no motion tenderness, no discharge and no friability. No bleeding in the vagina. No vaginal discharge found.  Musculoskeletal: Normal range of motion.  Neurological: She is alert and oriented to person, place, and time.  Skin: Skin is warm and dry.  Psychiatric: She has a normal mood and affect. Her behavior is normal.  Vitals reviewed.    ASSESSMENT & PLAN: Alyssa Blanchard was seen today for gynecologic exam.  Diagnoses and all orders for this visit:  Pap smear for cervical cancer screening  Need for prophylactic vaccination and inoculation against influenza -     Flu Vaccine QUAD 36+ mos IM   Patient Instructions       IF you received an x-ray today, you will receive an invoice from [facility]. Please contact [facility] at [phone] with questions or concerns regarding your invoice.   IF you received labwork today, you will receive an invoice from [facility]. Please contact [facility] at [phone] with questions or concerns regarding your invoice.   Our billing staff will not be able to assist you with questions regarding bills from these companies.  You will be contacted with the lab results as soon as they are available. The fastest way to get your results is to activate your My Chart account. Instructions are located on the last page of this paperwork. If you have not heard from [place] regarding the results in 2 weeks, please contact this office.    Health Maintenance, Female Adopting a healthy lifestyle and getting preventive care can go a long way to promote health and wellness. Talk with your health care provider about what schedule of regular  examinations is right for you. This is a good chance for you to check in with your provider about disease prevention and staying healthy. In between checkups, there are plenty of things you can do on your own. Experts have done a lot of research about which lifestyle changes and preventive measures are most likely to keep you healthy. Ask your health care provider for more information. Weight and diet Eat a healthy diet  Be sure to include plenty of vegetables, fruits, low-fat dairy products, and lean protein.  Do not eat a lot of foods high in solid fats, added sugars, or salt.  Get regular exercise. This is one of the most important things you can do for your health. ? Most adults should exercise for at least 150 minutes each week. The exercise should increase your heart rate and make you sweat (moderate-intensity exercise). ? Most adults should also do strengthening exercises at least twice a week. This is in addition to the moderate-intensity exercise.  Maintain a healthy weight  Body mass index (BMI) is a measurement that can be used to identify possible weight problems. It estimates body fat based on height and weight. Your health care provider can help determine your BMI and help you achieve or maintain a healthy weight.  For females 68 years of age and older: ? A BMI below 18.5 is considered underweight. ? A BMI of 18.5 to 24.9 is normal. ? A BMI of 25 to 29.9 is considered overweight. ? A BMI of 30 and above is considered obese.  Watch levels of cholesterol and blood lipids  You should start having your blood tested for lipids and cholesterol at 46 years of age, then have this test every 5 years.  You may need to have your cholesterol levels checked more often if: ? Your lipid or cholesterol levels are high. ? You are older than 46 years of age. ? You are at high risk for heart disease.  Cancer screening Lung Cancer  Lung cancer screening is recommended for adults 44-20  years old who are at high risk for lung cancer because of a history of smoking.  A yearly low-dose CT scan of the lungs is  recommended for people who: ? Currently smoke. ? Have quit within the past 15 years. ? Have at least a 30-pack-year history of smoking. A pack year is smoking an average of one pack of cigarettes a day for 1 year.  Yearly screening should continue until it has been 15 years since you quit.  Yearly screening should stop if you develop a health problem that would prevent you from having lung cancer treatment.  Breast Cancer  Practice breast self-awareness. This means understanding how your breasts normally appear and feel.  It also means doing regular breast self-exams. Let your health care provider know about any changes, no matter how small.  If you are in your 20s or 30s, you should have a clinical breast exam (CBE) by a health care provider every 1-3 years as part of a regular health exam.  If you are 50 or older, have a CBE every year. Also consider having a breast X-ray (mammogram) every year.  If you have a family history of breast cancer, talk to your health care provider about genetic screening.  If you are at high risk for breast cancer, talk to your health care provider about having an MRI and a mammogram every year.  Breast cancer gene (BRCA) assessment is recommended for women who have family members with BRCA-related cancers. BRCA-related cancers include: ? Breast. ? Ovarian. ? Tubal. ? Peritoneal cancers.  Results of the assessment will determine the need for genetic counseling and BRCA1 and BRCA2 testing.  Cervical Cancer Your health care provider may recommend that you be screened regularly for cancer of the pelvic organs (ovaries, uterus, and vagina). This screening involves a pelvic examination, including checking for microscopic changes to the surface of your cervix (Pap test). You may be encouraged to have this screening done every 3 years,  beginning at age 71.  For women ages 7-65, health care providers may recommend pelvic exams and Pap testing every 3 years, or they may recommend the Pap and pelvic exam, combined with testing for human papilloma virus (HPV), every 5 years. Some types of HPV increase your risk of cervical cancer. Testing for HPV may also be done on women of any age with unclear Pap test results.  Other health care providers may not recommend any screening for nonpregnant women who are considered low risk for pelvic cancer and who do not have symptoms. Ask your health care provider if a screening pelvic exam is right for you.  If you have had past treatment for cervical cancer or a condition that could lead to cancer, you need Pap tests and screening for cancer for at least 20 years after your treatment. If Pap tests have been discontinued, your risk factors (such as having a new sexual partner) need to be reassessed to determine if screening should resume. Some women have medical problems that increase the chance of getting cervical cancer. In these cases, your health care provider may recommend more frequent screening and Pap tests.  Colorectal Cancer  This type of cancer can be detected and often prevented.  Routine colorectal cancer screening usually begins at 46 years of age and continues through 46 years of age.  Your health care provider may recommend screening at an earlier age if you have risk factors for colon cancer.  Your health care provider may also recommend using home test kits to check for hidden blood in the stool.  A small camera at the end of a tube can be used to examine your colon  directly (sigmoidoscopy or colonoscopy). This is done to check for the earliest forms of colorectal cancer.  Routine screening usually begins at age 16.  Direct examination of the colon should be repeated every 5-10 years through 46 years of age. However, you may need to be screened more often if early forms of  precancerous polyps or small growths are found.  Skin Cancer  Check your skin from head to toe regularly.  Tell your health care provider about any new moles or changes in moles, especially if there is a change in a mole's shape or color.  Also tell your health care provider if you have a mole that is larger than the size of a pencil eraser.  Always use sunscreen. Apply sunscreen liberally and repeatedly throughout the day.  Protect yourself by wearing long sleeves, pants, a wide-brimmed hat, and sunglasses whenever you are outside.  Heart disease, diabetes, and high blood pressure  High blood pressure causes heart disease and increases the risk of stroke. High blood pressure is more likely to develop in: ? People who have blood pressure in the high end of the normal range (130-139/85-89 mm Hg). ? People who are overweight or obese. ? People who are African American.  If you are 39-74 years of age, have your blood pressure checked every 3-5 years. If you are 28 years of age or older, have your blood pressure checked every year. You should have your blood pressure measured twice-once when you are at a hospital or clinic, and once when you are not at a hospital or clinic. Record the average of the two measurements. To check your blood pressure when you are not at a hospital or clinic, you can use: ? An automated blood pressure machine at a pharmacy. ? A home blood pressure monitor.  If you are between 25 years and 48 years old, ask your health care provider if you should take aspirin to prevent strokes.  Have regular diabetes screenings. This involves taking a blood sample to check your fasting blood sugar level. ? If you are at a normal weight and have a low risk for diabetes, have this test once every three years after 46 years of age. ? If you are overweight and have a high risk for diabetes, consider being tested at a younger age or more often. Preventing infection Hepatitis B  If  you have a higher risk for hepatitis B, you should be screened for this virus. You are considered at high risk for hepatitis B if: ? You were born in a country where hepatitis B is common. Ask your health care provider which countries are considered high risk. ? Your parents were born in a high-risk country, and you have not been immunized against hepatitis B (hepatitis B vaccine). ? You have HIV or AIDS. ? You use needles to inject street drugs. ? You live with someone who has hepatitis B. ? You have had sex with someone who has hepatitis B. ? You get hemodialysis treatment. ? You take certain medicines for conditions, including cancer, organ transplantation, and autoimmune conditions.  Hepatitis C  Blood testing is recommended for: ? Everyone born from [birth year] through 1965. ? Anyone with known risk factors for hepatitis C.  Sexually transmitted infections (STIs)  You should be screened for sexually transmitted infections (STIs) including gonorrhea and chlamydia if: ? You are sexually active and are younger than 46 years of age. ? You are older than 46 years of age and your health care  provider tells you that you are at risk for this type of infection. ? Your sexual activity has changed since you were last screened and you are at an increased risk for chlamydia or gonorrhea. Ask your health care provider if you are at risk.  If you do not have HIV, but are at risk, it may be recommended that you take a prescription medicine daily to prevent HIV infection. This is called pre-exposure prophylaxis (PrEP). You are considered at risk if: ? You are sexually active and do not regularly use condoms or know the HIV status of your partner(s). ? You take drugs by injection. ? You are sexually active with a partner who has HIV.  Talk with your health care provider about whether you are at high risk of being infected with HIV. If you choose to begin PrEP, you should first be tested for HIV. You should  then be tested every 3 months for as long as you are taking PrEP. Pregnancy  If you are premenopausal and you may become pregnant, ask your health care provider about preconception counseling.  If you may become pregnant, take 400 to 800 micrograms (mcg) of folic acid every day.  If you want to prevent pregnancy, talk to your health care provider about birth control (contraception). Osteoporosis and menopause  Osteoporosis is a disease in which the bones lose minerals and strength with aging. This can result in serious bone fractures. Your risk for osteoporosis can be identified using a bone density scan.  If you are 45 years of age or older, or if you are at risk for osteoporosis and fractures, ask your health care provider if you should be screened.  Ask your health care provider whether you should take a calcium or vitamin D supplement to lower your risk for osteoporosis.  Menopause may have certain physical symptoms and risks.  Hormone replacement therapy may reduce some of these symptoms and risks. Talk to your health care provider about whether hormone replacement therapy is right for you. Follow these instructions at home:  Schedule regular health, dental, and eye exams.  Stay current with your immunizations.  Do not use any tobacco products including cigarettes, chewing tobacco, or electronic cigarettes.  If you are pregnant, do not drink alcohol.  If you are breastfeeding, limit how much and how often you drink alcohol.  Limit alcohol intake to no more than 1 drink per day for nonpregnant women. One drink equals 12 ounces of beer, 5 ounces of wine, or 1 ounces of hard liquor.  Do not use street drugs.  Do not share needles.  Ask your health care provider for help if you need support or information about quitting drugs.  Tell your health care provider if you often feel depressed.  Tell your health care provider if you have ever been abused or do not feel safe at  home. This information is not intended to replace advice given to you by your health care provider. Make sure you discuss any questions you have with your health care provider. Document Released: 09/09/2010 Document Revised: 08/02/2015 Document Reviewed: 11/28/2014 Elsevier Interactive Patient Education  2018 Elsevier Inc.      Agustina Caroli, MD [facility]

## 2017-02-05 NOTE — Progress Notes (Signed)
Subjective:   Patient ID: Alyssa Blanchard, female   DOB: 46 y.o.   MRN: [id number]   HPI Patient presents with exquisite discomfort plantar fascial bilateral and states that she has over-the-counter insoles which have helped her slightly but she needs something that is more constant.  Patient is noted to have exquisite pain when I press the plantar fascia right and left heel and has had long-term history of this problem   ROS      Objective:  Physical Exam  Acute plantar fasciitis bilateral with inflammation upon palpation with moderate depression of the arch noted     Assessment:  Inflammatory fasciitis bilateral long-term chronic nature with pain upon palpation with mechanical dysfunction of the arch     Plan:  H&P condition reviewed and injected the plantar fascial bilateral 3 mg Kenalog 5 mg Xylocaine and discussed long-term orthotic treatment.  Patient is scanned for custom-made orthotic to reduce all plantar stress and will be seen back when those are returned.

## 2017-02-05 NOTE — Addendum Note (Signed)
Addended by: Evie LacksSAGARDIA, Skipper Dacosta J on: 02/05/2017 11:30 AM   Modules accepted: Orders

## 2017-02-05 NOTE — Telephone Encounter (Signed)
Pt called [facility] requesting a work note stating she was seen on 12/31/16 and what for. Pt told [facility] she needed this by tomorrow for work. [facility] I could not guarantee it would be done before we closed, but would put a message in. Please contact pt if able to write note. Thanks!

## 2017-02-05 NOTE — Patient Instructions (Addendum)
   IF you received an x-ray today, you will receive an invoice from [facility]. Please contact [facility] at [phone] with questions or concerns regarding your invoice.   IF you received labwork today, you will receive an invoice from [facility]. Please contact [facility] at [phone] with questions or concerns regarding your invoice.   Our billing staff will not be able to assist you with questions regarding bills from these companies.  You will be contacted with the lab results as soon as they are available. The fastest way to get your results is to activate your My Chart account. Instructions are located on the last page of this paperwork. If you have not heard from us regarding the results in 2 weeks, please contact this office.    Health Maintenance, Female Adopting a healthy lifestyle and getting preventive care can go a long way to promote health and wellness. Talk with your health care provider about what schedule of regular examinations is right for you. This is a good chance for you to check in with your provider about disease prevention and staying healthy. In between checkups, there are plenty of things you can do on your own. Experts have done a lot of research about which lifestyle changes and preventive measures are most likely to keep you healthy. Ask your health care provider for more information. Weight and diet Eat a healthy diet  Be sure to include plenty of vegetables, fruits, low-fat dairy products, and lean protein.  Do not eat a lot of foods high in solid fats, added sugars, or salt.  Get regular exercise. This is one of the most important things you can do for your health. ? Most adults should exercise for at least 150 minutes each week. The exercise should increase your heart rate and make you sweat (moderate-intensity exercise). ? Most adults should also do strengthening exercises at least twice a week. This is in addition to the  moderate-intensity exercise.  Maintain a healthy weight  Body mass index (BMI) is a measurement that can be used to identify possible weight problems. It estimates body fat based on height and weight. Your health care provider can help determine your BMI and help you achieve or maintain a healthy weight.  For females 20 years of age and older: ? A BMI below 18.5 is considered underweight. ? A BMI of 18.5 to 24.9 is normal. ? A BMI of 25 to 29.9 is considered overweight. ? A BMI of 30 and above is considered obese.  Watch levels of cholesterol and blood lipids  You should start having your blood tested for lipids and cholesterol at 46 years of age, then have this test every 5 years.  You may need to have your cholesterol levels checked more often if: ? Your lipid or cholesterol levels are high. ? You are older than 46 years of age. ? You are at high risk for heart disease.  Cancer screening Lung Cancer  Lung cancer screening is recommended for adults 55-80 years old who are at high risk for lung cancer because of a history of smoking.  A yearly low-dose CT scan of the lungs is recommended for people who: ? Currently smoke. ? Have quit within the past 15 years. ? Have at least a 30-pack-year history of smoking. A pack year is smoking an average of one pack of cigarettes a day for 1 year.  Yearly screening should continue until it has been 15 years since you quit.  Yearly screening   should stop if you develop a health problem that would prevent you from having lung cancer treatment.  Breast Cancer  Practice breast self-awareness. This means understanding how your breasts normally appear and feel.  It also means doing regular breast self-exams. Let your health care provider know about any changes, no matter how small.  If you are in your 20s or 30s, you should have a clinical breast exam (CBE) by a health care provider every 1-3 years as part of a regular health exam.  If you  are 42 or older, have a CBE every year. Also consider having a breast X-ray (mammogram) every year.  If you have a family history of breast cancer, talk to your health care provider about genetic screening.  If you are at high risk for breast cancer, talk to your health care provider about having an MRI and a mammogram every year.  Breast cancer gene (BRCA) assessment is recommended for women who have family members with BRCA-related cancers. BRCA-related cancers include: ? Breast. ? Ovarian. ? Tubal. ? Peritoneal cancers.  Results of the assessment will determine the need for genetic counseling and BRCA1 and BRCA2 testing.  Cervical Cancer Your health care provider may recommend that you be screened regularly for cancer of the pelvic organs (ovaries, uterus, and vagina). This screening involves a pelvic examination, including checking for microscopic changes to the surface of your cervix (Pap test). You may be encouraged to have this screening done every 3 years, beginning at age 56.  For women ages 41-65, health care providers may recommend pelvic exams and Pap testing every 3 years, or they may recommend the Pap and pelvic exam, combined with testing for human papilloma virus (HPV), every 5 years. Some types of HPV increase your risk of cervical cancer. Testing for HPV may also be done on women of any age with unclear Pap test results.  Other health care providers may not recommend any screening for nonpregnant women who are considered low risk for pelvic cancer and who do not have symptoms. Ask your health care provider if a screening pelvic exam is right for you.  If you have had past treatment for cervical cancer or a condition that could lead to cancer, you need Pap tests and screening for cancer for at least 20 years after your treatment. If Pap tests have been discontinued, your risk factors (such as having a new sexual partner) need to be reassessed to determine if screening should  resume. Some women have medical problems that increase the chance of getting cervical cancer. In these cases, your health care provider may recommend more frequent screening and Pap tests.  Colorectal Cancer  This type of cancer can be detected and often prevented.  Routine colorectal cancer screening usually begins at 46 years of age and continues through 46 years of age.  Your health care provider may recommend screening at an earlier age if you have risk factors for colon cancer.  Your health care provider may also recommend using home test kits to check for hidden blood in the stool.  A small camera at the end of a tube can be used to examine your colon directly (sigmoidoscopy or colonoscopy). This is done to check for the earliest forms of colorectal cancer.  Routine screening usually begins at age 16.  Direct examination of the colon should be repeated every 5-10 years through 46 years of age. However, you may need to be screened more often if early forms of precancerous polyps or  small growths are found.  Skin Cancer  Check your skin from head to toe regularly.  Tell your health care provider about any new moles or changes in moles, especially if there is a change in a mole's shape or color.  Also tell your health care provider if you have a mole that is larger than the size of a pencil eraser.  Always use sunscreen. Apply sunscreen liberally and repeatedly throughout the day.  Protect yourself by wearing long sleeves, pants, a wide-brimmed hat, and sunglasses whenever you are outside.  Heart disease, diabetes, and high blood pressure  High blood pressure causes heart disease and increases the risk of stroke. High blood pressure is more likely to develop in: ? People who have blood pressure in the high end of the normal range (130-139/85-89 mm Hg). ? People who are overweight or obese. ? People who are African American.  If you are 61-36 years of age, have your blood  pressure checked every 3-5 years. If you are 6 years of age or older, have your blood pressure checked every year. You should have your blood pressure measured twice-once when you are at a hospital or clinic, and once when you are not at a hospital or clinic. Record the average of the two measurements. To check your blood pressure when you are not at a hospital or clinic, you can use: ? An automated blood pressure machine at a pharmacy. ? A home blood pressure monitor.  If you are between 7 years and 29 years old, ask your health care provider if you should take aspirin to prevent strokes.  Have regular diabetes screenings. This involves taking a blood sample to check your fasting blood sugar level. ? If you are at a normal weight and have a low risk for diabetes, have this test once every three years after 46 years of age. ? If you are overweight and have a high risk for diabetes, consider being tested at a younger age or more often. Preventing infection Hepatitis B  If you have a higher risk for hepatitis B, you should be screened for this virus. You are considered at high risk for hepatitis B if: ? You were born in a country where hepatitis B is common. Ask your health care provider which countries are considered high risk. ? Your parents were born in a high-risk country, and you have not been immunized against hepatitis B (hepatitis B vaccine). ? You have HIV or AIDS. ? You use needles to inject street drugs. ? You live with someone who has hepatitis B. ? You have had sex with someone who has hepatitis B. ? You get hemodialysis treatment. ? You take certain medicines for conditions, including cancer, organ transplantation, and autoimmune conditions.  Hepatitis C  Blood testing is recommended for: ? Everyone born from [birth year] through 1965. ? Anyone with known risk factors for hepatitis C.  Sexually transmitted infections (STIs)  You should be screened for sexually transmitted  infections (STIs) including gonorrhea and chlamydia if: ? You are sexually active and are younger than 46 years of age. ? You are older than 46 years of age and your health care provider tells you that you are at risk for this type of infection. ? Your sexual activity has changed since you were last screened and you are at an increased risk for chlamydia or gonorrhea. Ask your health care provider if you are at risk.  If you do not have HIV, but are at risk, it  may be recommended that you take a prescription medicine daily to prevent HIV infection. This is called pre-exposure prophylaxis (PrEP). You are considered at risk if: ? You are sexually active and do not regularly use condoms or know the HIV status of your partner(s). ? You take drugs by injection. ? You are sexually active with a partner who has HIV.  Talk with your health care provider about whether you are at high risk of being infected with HIV. If you choose to begin PrEP, you should first be tested for HIV. You should then be tested every 3 months for as long as you are taking PrEP. Pregnancy  If you are premenopausal and you may become pregnant, ask your health care provider about preconception counseling.  If you may become pregnant, take 400 to 800 micrograms (mcg) of folic acid every day.  If you want to prevent pregnancy, talk to your health care provider about birth control (contraception). Osteoporosis and menopause  Osteoporosis is a disease in which the bones lose minerals and strength with aging. This can result in serious bone fractures. Your risk for osteoporosis can be identified using a bone density scan.  If you are 65 years of age or older, or if you are at risk for osteoporosis and fractures, ask your health care provider if you should be screened.  Ask your health care provider whether you should take a calcium or vitamin D supplement to lower your risk for osteoporosis.  Menopause may have certain physical  symptoms and risks.  Hormone replacement therapy may reduce some of these symptoms and risks. Talk to your health care provider about whether hormone replacement therapy is right for you. Follow these instructions at home:  Schedule regular health, dental, and eye exams.  Stay current with your immunizations.  Do not use any tobacco products including cigarettes, chewing tobacco, or electronic cigarettes.  If you are pregnant, do not drink alcohol.  If you are breastfeeding, limit how much and how often you drink alcohol.  Limit alcohol intake to no more than 1 drink per day for nonpregnant women. One drink equals 12 ounces of beer, 5 ounces of wine, or 1 ounces of hard liquor.  Do not use street drugs.  Do not share needles.  Ask your health care provider for help if you need support or information about quitting drugs.  Tell your health care provider if you often feel depressed.  Tell your health care provider if you have ever been abused or do not feel safe at home. This information is not intended to replace advice given to you by your health care provider. Make sure you discuss any questions you have with your health care provider. Document Released: 09/09/2010 Document Revised: 08/02/2015 Document Reviewed: 11/28/2014 Elsevier Interactive Patient Education  2018 Elsevier Inc.  

## 2017-02-06 ENCOUNTER — Encounter: Payer: Self-pay | Admitting: Radiology

## 2017-02-06 LAB — PAP IG W/ RFLX HPV ASCU: PAP Smear Comment: 0

## 2017-02-06 NOTE — Telephone Encounter (Signed)
Sent through mychart

## 2017-03-23 ENCOUNTER — Ambulatory Visit: Payer: BLUE CROSS/BLUE SHIELD | Admitting: Orthotics

## 2017-03-23 DIAGNOSIS — M722 Plantar fascial fibromatosis: Secondary | ICD-10-CM

## 2017-03-23 NOTE — Progress Notes (Signed)
Patient came in today to pick up custom made foot orthotics.  The goals were accomplished and the patient reported no dissatisfaction with said orthotics.  Patient was advised of breakin period and how to report any issues. 

## 2017-04-02 ENCOUNTER — Ambulatory Visit: Payer: BLUE CROSS/BLUE SHIELD | Admitting: Family Medicine

## 2017-04-02 ENCOUNTER — Other Ambulatory Visit: Payer: Self-pay

## 2017-04-02 ENCOUNTER — Encounter: Payer: Self-pay | Admitting: Family Medicine

## 2017-04-02 VITALS — BP 135/84 | HR 74 | Temp 98.0°F | Resp 17 | Ht 66.5 in | Wt 322.2 lb

## 2017-04-02 DIAGNOSIS — S335XXA Sprain of ligaments of lumbar spine, initial encounter: Secondary | ICD-10-CM

## 2017-04-02 DIAGNOSIS — M62830 Muscle spasm of back: Secondary | ICD-10-CM | POA: Diagnosis not present

## 2017-04-02 MED ORDER — CYCLOBENZAPRINE HCL 5 MG PO TABS: 5.0000 mg | ORAL_TABLET | Freq: Every day | ORAL | 0 refills | Status: DC

## 2017-04-02 MED ORDER — PREDNISONE 10 MG PO TABS: ORAL_TABLET | ORAL | 0 refills | Status: AC

## 2017-04-02 NOTE — Progress Notes (Signed)
Chief Complaint  Patient presents with  . left side back pain    starts in center of back then goes to left side then straight up back, pain level 8/10 and 10/10 when it started, pt stands in feet all days and pullled something back but not sure how.  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 Anxiety   . Asthma   . Depression   . GERD (gastroesophageal reflux disease)   . Heel spur    bilat  . History of bronchitis   . History of chicken pox   . History of urinary tract infection   . Migraines   . Plantar fasciitis    bilat  . STD (sexually transmitted disease)    chl hx & hsv 1&2  . Tremors of nervous system   . Urinary incontinence     Current Outpatient Medications  Medication Sig Dispense Refill  . acetaminophen (TYLENOL) 500 MG tablet Take 1,000 mg by mouth every 6 (six) hours as needed for mild pain.    Marland Kitchen albuterol (PROVENTIL HFA;VENTOLIN HFA) 108 (90 BASE) MCG/ACT inhaler Inhale 2 puffs into the lungs every 6 (six) hours as needed. 6.7 g 1  . ALPRAZolam (XANAX) 1 MG tablet TAKE 1/2 TO 1 TABLET BY MOUTH EVERY 12 HOURS AS NEEDED (FILLABLE 01/24/16)  0  . ARIPiprazole (ABILIFY) 15 MG tablet Take 20 mg by mouth at bedtime.     Marland Kitchen buPROPion (WELLBUTRIN XL) 300 MG 24 hr tablet Take 300 mg by mouth every morning.   0  . famotidine (PEPCID) 20 MG tablet  Take 20 mg by mouth 2 (two) times daily.    . Multiple Vitamin (MULTIVITAMIN WITH MINERALS) TABS tablet Take 1 tablet by mouth daily.    . pantoprazole (PROTONIX) 40 MG tablet Take 40 mg by mouth daily.    . promethazine (PHENERGAN) 12.5 MG tablet 1 (ONE) TABLET BY MOUTH EVERY FOUR HOURS, AS NEEDED  0  . SUMAtriptan (IMITREX) 100 MG tablet ! Tab at onset of HA. May repeat in 2 hours if headache persists or recurs.No more than 4 doses a month. 10 tablet 0  . cyclobenzaprine (FLEXERIL) 5 MG tablet Take 1 tablet (5 mg total) by mouth at bedtime. 10 tablet 0  . predniSONE (DELTASONE) 10 MG tablet Take 6 tablets (60 mg total) by mouth daily with breakfast for 1 day, THEN 5 tablets (50 mg total) daily with breakfast for 1 day, THEN 4 tablets (40 mg total) daily with breakfast for 1 day, THEN 3 tablets (30 mg total) daily with breakfast for 1 day, THEN 2 tablets (20 mg total) daily with breakfast for 1 day, THEN 1 tablet (10 mg total) daily with breakfast for 1 day. 21  tablet 0   Current Facility-Administered Medications  Medication Dose Route Frequency Provider Last Rate Last Dose  . triamcinolone acetonide (KENALOG) 10 MG/ML injection 10 mg  10 mg Other Once Alvan DameSikora, Alyssa Blanchard, DPM        Allergies: Not on File  Past Surgical History:  Procedure Laterality Date  . LAPAROSCOPIC GASTRIC SLEEVE RESECTION N/A 11/27/2014   Procedure: LAPAROSCOPIC GASTRIC SLEEVE RESECTION WITH HIATAL HERNIA REPAIR UPPER ENDOSCOPY;  Surgeon: Luretha MurphyMatthew Martin, MD;  Location: [facility];  Service: General;  Laterality: N/A;  . TONSILLECTOMY  1978    Social History   Socioeconomic History  . Marital status: Single    Spouse name: None  . Number of children: None  . Years of education: 6012  . Highest education level: None  Social Needs  . Financial resource strain: None  . Food insecurity - worry: None  . Food insecurity - inability: None  . Transportation needs - medical: None  . Transportation needs - non-medical: None    Occupational History  . Occupation: Chartered certified accountantCosmetics    Employer: [employer]  Tobacco Use  . Smoking status: Former Smoker    Packs/day: 0.25    Years: 5.00    Pack years: 1.25    Types: Cigarettes    Last attempt to quit: 03/10/2012    Years since quitting: 5.0  . Smokeless tobacco: Never Used  Substance and Sexual Activity  . Alcohol use: Yes    Alcohol/week: 0.0 oz    Comment: 1 a month  . Drug use: No  . Sexual activity: Yes    Partners: Male    Birth control/protection: Condom  Other Topics Concern  . None  Social History Narrative   Regular exercise-no   Caffeine Use-yes, 1-2 cups of caffeine daily    Family History  Problem Relation Age of Onset  . Diabetes Mother   . Hypertension Mother   . Hyperlipidemia Mother   . Kidney disease Mother   . Cancer Father        pancreatic  . Hypertension Maternal Grandmother   . Diabetes Maternal Grandmother   . Heart failure Maternal Grandmother        CHF  . Heart attack Maternal Grandfather   . Hypertension Maternal Grandfather   . Hypertension Paternal Grandmother   . Alzheimer's disease Paternal Grandmother   . Hypertension Paternal Grandfather   . Cancer Maternal Uncle        melanoma  . Cancer Paternal Uncle        kidney     ROS Review of Systems See HPI Constitution: No fevers or chills No malaise No diaphoresis Skin: No rash or itching Eyes: no blurry vision, no double vision GU: no dysuria or hematuria Neuro: no dizziness or headaches all others reviewed and negative   Objective: Vitals:   04/02/17 1058  BP: 135/84  Pulse: 74  Resp: 17  Temp: 98 F (36.7 C)  TempSrc: Oral  SpO2: 100%  Weight: (!) 322 lb 3.2 oz (146.1 kg)  Height: 5' 6.5" (1.689 m)    Physical Exam  Constitutional: She is oriented to person, place, and time. She appears well-developed and well-nourished.  HENT:  Head: Normocephalic and atraumatic.  Cardiovascular: Normal rate, regular rhythm and normal heart sounds.   Pulmonary/Chest: Effort normal and breath sounds normal. No stridor. No respiratory distress.  Musculoskeletal:       Lumbar back: She exhibits decreased range of motion and spasm. She exhibits no tenderness, no bony tenderness, no  swelling, no edema, no deformity and normal pulse.  Tenderness at the waist, also with all range of motion pt visible uncomfortable. Normal gait once standing  Neurological: She is alert and oriented to person, place, and time.    Assessment and Plan Alyssa Blanchard was seen today for left side back pain.  Diagnoses and all orders for this visit:  Sprain of low back, initial encounter  Back muscle spasm  No neuro deficits No red flag signs thus imagining not necessary -     predniSONE (DELTASONE) 10 MG tablet; Take 6 tablets (60 mg total) by mouth daily with breakfast for 1 day, THEN 5 tablets (50 mg total) daily with breakfast for 1 day, THEN 4 tablets (40 mg total) daily with breakfast for 1 day, THEN 3 tablets (30 mg total) daily with breakfast for 1 day, THEN 2 tablets (20 mg total) daily with breakfast for 1 day, THEN 1 tablet (10 mg total) daily with breakfast for 1 day. -     cyclobenzaprine (FLEXERIL) 5 MG tablet; Take 1 tablet (5 mg total) by mouth at bedtime.     Alyssa Blanchard A Deakon Frix

## 2017-04-02 NOTE — Patient Instructions (Addendum)
Aspercreme with lidocaine     IF you received an x-ray today, you will receive an invoice from [facility]. Please contact [facility] at [phone] with questions or concerns regarding your invoice.   IF you received labwork today, you will receive an invoice from [facility]. Please contact [facility] at [phone] with questions or concerns regarding your invoice.   Our billing staff will not be able to assist you with questions regarding bills from these companies.  You will be contacted with the lab results as soon as they are available. The fastest way to get your results is to activate your My Chart account. Instructions are located on the last page of this paperwork. If you have not heard from us regarding the results in 2 weeks, please contact this office.     Muscle Cramps and Spasms Muscle cramps and spasms occur when a muscle or muscles tighten and you have no control over this tightening (involuntary muscle contraction). They are a common problem and can develop in any muscle. The most common place is in the calf muscles of the leg. Muscle cramps and muscle spasms are both involuntary muscle contractions, but there are some differences between the two:  Muscle cramps are painful. They come and go and may last a few seconds to 15 minutes. Muscle cramps are often more forceful and last longer than muscle spasms.  Muscle spasms may or may not be painful. They may also last just a few seconds or much longer.  Certain medical conditions, such as diabetes or Parkinson disease, can make it more likely to develop cramps or spasms. However, cramps or spasms are usually not caused by a serious underlying problem. Common causes include:  Overexertion.  Overuse from repetitive motions, or doing the same thing over and over.  Remaining in a certain position for a long period of time.  Improper preparation, form, or technique while playing a sport or doing an  activity.  Dehydration.  Injury.  Side effects of some medicines.  Abnormally low levels of the salts and ions in your blood (electrolytes), especially potassium and calcium. This could happen if you are taking water pills (diuretics) or if you are pregnant.  In many cases, the cause of muscle cramps or spasms is unknown. Follow these instructions at home:  Stay well hydrated. Drink enough fluid to keep your urine clear or pale yellow.  Try massaging, stretching, and relaxing the affected muscle.  If directed, apply heat to tight or tense muscles as often as told by your health care provider. Use the heat source that your health care provider recommends, such as a moist heat pack or a heating pad. ? Place a towel between your skin and the heat source. ? Leave the heat on for 20-30 minutes. ? Remove the heat if your skin turns bright red. This is especially important if you are unable to feel pain, heat, or cold. You may have a greater risk of getting burned.  If directed, put ice on the affected area. This may help if you are sore or have pain after a cramp or spasm. ? Put ice in a plastic bag. ? Place a towel between your skin and the bag. ? Leavethe ice on for 20 minutes, 2-3 times a day.  Take over-the-counter and prescription medicines only as told by your health care provider.  Pay attention to any changes in your symptoms. Contact a health care provider if:  Your cramps or spasms get more severe or happen  more often.  Your cramps or spasms do not improve over time. This information is not intended to replace advice given to you by your health care provider. Make sure you discuss any questions you have with your health care provider. Document Released: 08/16/2001 Document Revised: 03/28/2015 Document Reviewed: 11/28/2014 Elsevier Interactive Patient Education  2018 [employer].

## 2017-04-27 ENCOUNTER — Telehealth: Payer: Self-pay | Admitting: Emergency Medicine

## 2017-04-27 NOTE — Telephone Encounter (Signed)
Copied from CRM [phone]. Topic: Quick Communication - See Telephone Encounter >> Apr 27, 2017  5:55 PM Rudi CocoLathan, Paulo Keimig M, NT wrote: CRM for notification. See Telephone encounter for:   04/27/17. Pt. Calling needed a shot for migraine let pt. Know that she will need an appt. To due so. Pt. Stated that she will show up at [facility] to speak with someone called over spoke with Gearldine BienenstockBrandy to notify practice. Pt. Also stated that med. Caould called in but didn't know name of med. That was needed. Offered to call out list of meds. Pt. Yelled that she didn't know and that she will be at office within two minutes of being on the phone. Pt. Can be reached at [phone]

## 2017-04-30 NOTE — Telephone Encounter (Signed)
Reviewed chart.  Pt did not have any appt on 04/27/2017 or since.

## 2017-05-14 ENCOUNTER — Ambulatory Visit: Payer: Self-pay | Admitting: *Deleted

## 2017-05-14 NOTE — Telephone Encounter (Signed)
Pt  Advised  [facility]  [facility]  /   [facility]   Have  Someone  [address]  Call  [phone]   If  Any  Problems  Or if you  Become  Worse    Reason for Disposition . [1] MODERATE asthma attack (e.g., SOB at rest, speaks in phrases, audible wheezes) AND [2] not resolved after 2 nebulizer or inhaler treatments given 20 minutes apart  Answer Assessment - Initial Assessment Questions 1. RESPIRATORY STATUS: "Describe your breathing?" (e.g., wheezing, shortness of breath, unable to speak, severe coughing)      Shortness  Of  Breath    Wheezing  And  Coughing    2. ONSET: "When did this asthma attack begin?"        Started  2   Weeks  Ago   Getting  Worse   3  Days  Ago   3. TRIGGER: "What do you think triggered this attack?" (e.g., URI, exposure to pollen or other allergen, tobacco smoke)      Sinus  Drainage     4. PEAK EXPIRATORY FLOW RATE (PEFR): "Do you use a peak flow meter?" If so, ask: "What's the current peak flow? What's your personal best peak flow?"        no 5. SEVERITY: "How bad is this attack?"    - MILD: No SOB at rest, mild SOB with walking, speaks normally in sentences, can lay down, no retractions, pulse < 100. (GREEN Zone: PEFR 80-100%)   - MODERATE: SOB at rest, SOB with minimal exertion and prefers to sit, cannot lie down flat, speaks in phrases, mild retractions, audible wheezing, pulse 100-120. (YELLOW Zone: PEFR 50-80%)    - SEVERE: Very SOB at rest, speaks in single words, struggling to breathe, sitting hunched forward, retractions, usually loud wheezing, sometimes minimal wheezing because of decreased air movement, pulse > 120. (RED Zone: PEFR < 50%).      Moderate   6. MEDICATIONS (Inhaler or nebs): "What are your asthma medications?" and "What treatments have you given so far?"    - Quick-relief: albuterol, metaproterenol, salbutamol, or other inhaled or nebulized beta-agonist medicines   - Long-term-control: steroids, cromolyn, or other anti-inflammatory  medicines.     OTC  AND  ALBUTEROL    7. OTHER SYMPTOMS: "Do you have any other symptoms? (e.g., runny nose, chest pain, fever)        HAD  FEVER    Cough     8. PREGNANCY: "Is there any chance you are pregnant?" "When was your last menstrual period?"         16  Feb  Protocols used: ASTHMA ATTACK-A-AH

## 2017-05-15 ENCOUNTER — Other Ambulatory Visit: Payer: Self-pay

## 2017-05-15 ENCOUNTER — Ambulatory Visit: Payer: BLUE CROSS/BLUE SHIELD | Admitting: Physician Assistant

## 2017-05-15 ENCOUNTER — Encounter: Payer: Self-pay | Admitting: Physician Assistant

## 2017-05-15 VITALS — BP 122/72 | HR 98 | Temp 98.5°F | Resp 18 | Ht 66.5 in | Wt 318.8 lb

## 2017-05-15 DIAGNOSIS — J45901 Unspecified asthma with (acute) exacerbation: Secondary | ICD-10-CM | POA: Diagnosis not present

## 2017-05-15 DIAGNOSIS — K219 Gastro-esophageal reflux disease without esophagitis: Secondary | ICD-10-CM | POA: Diagnosis not present

## 2017-05-15 DIAGNOSIS — R05 Cough: Secondary | ICD-10-CM

## 2017-05-15 DIAGNOSIS — J069 Acute upper respiratory infection, unspecified: Secondary | ICD-10-CM

## 2017-05-15 DIAGNOSIS — R059 Cough, unspecified: Secondary | ICD-10-CM

## 2017-05-15 MED ORDER — HYDROCODONE-HOMATROPINE 5-1.5 MG/5ML PO SYRP: 5.0000 mL | ORAL_SOLUTION | Freq: Four times a day (QID) | ORAL | 0 refills | Status: AC | PRN

## 2017-05-15 MED ORDER — GUAIFENESIN ER 1200 MG PO TB12: 1.0000 | ORAL_TABLET | Freq: Two times a day (BID) | ORAL | 0 refills | Status: AC

## 2017-05-15 MED ORDER — FLUCONAZOLE 150 MG PO TABS
150.0000 mg | ORAL_TABLET | Freq: Once | ORAL | 0 refills | Status: AC
Start: 2017-05-15 — End: 2017-05-15

## 2017-05-15 MED ORDER — DOXYCYCLINE HYCLATE 100 MG PO TABS: 100.0000 mg | ORAL_TABLET | Freq: Two times a day (BID) | ORAL | 0 refills | Status: DC

## 2017-05-15 NOTE — Progress Notes (Signed)
Subjective:    Patient ID: Alyssa Blanchard, female    DOB: 1971/02/20, 47 y.o.   MRN: [id number]  Chief Complaint  Patient presents with  . Nasal Congestion    x 1.5weeks   . Hoarse  . Cough    coughing up yellow mucus     HPI  Patient presents for cough, nasal congestion, and hoarseness x 1.5 weeks. Patient recalls a sore throat being the initial symptom, congestion followed the sore throat, and subsequently the cough began. The cough is productive and sputum is yellow but she feels like the sputum is coming from her throat and not her chest.  Associated symptoms include fatigue, decrease in appetite, body aches, postnasal drip, SOB, wheezing, and dizziness. Patient states symptoms are worst at night and in the morning. Patient is taking Mucinex cold prep, Dayquill and Nightquill for one week. Patient states that it "helps some" but that she cannot "get rid of it." Patient reports that course of illness has progressively gotten worse. Pertinent negatives include headache, chills, and fever.  Patient went to urgent care ([facility]) yesterday (05/14/2017,) for asthma attack that was provoked by the cough. At this time, they did an albuterol nebulizer, gave patient prednisone and did a CXR and she was told to f/u with PCP.  She started her prednisone last night.  Patient is UTD on influenza vaccination Patient's mother became sick after patient with similar symptoms.  No Known Allergies   Prior to Admission medications   Medication Sig Start Date End Date Taking? Authorizing Provider  acetaminophen (TYLENOL) 500 MG tablet Take 1,000 mg by mouth every 6 (six) hours as needed for mild pain.   Yes [provider]  albuterol (PROVENTIL HFA;VENTOLIN HFA) 108 (90 BASE) MCG/ACT inhaler Inhale 2 puffs into the lungs every 6 (six) hours as needed. 07/09/13  Yes Elvina Sidle, MD  ALPRAZolam [name]) 1 MG tablet TAKE 1/2 TO 1 TABLET BY MOUTH EVERY 12 HOURS AS NEEDED (FILLABLE 01/24/16) 01/16/16   Yes [provider]  buPROPion (WELLBUTRIN XL) 300 MG 24 hr tablet Take 300 mg by mouth every morning.  08/09/14  Yes [provider]  cyclobenzaprine (FLEXERIL) 5 MG tablet Take 1 tablet (5 mg total) by mouth at bedtime. 04/02/17  Yes Collie Siad A, MD  famotidine (PEPCID) 20 MG tablet Take 20 mg by mouth 2 (two) times daily.   Yes [provider]  Multiple Vitamin (MULTIVITAMIN WITH MINERALS) TABS tablet Take 1 tablet by mouth daily.   Yes [provider]  pantoprazole (PROTONIX) 40 MG tablet Take 40 mg by mouth daily.   Yes [provider]  predniSONE (DELTASONE) 20 MG tablet  05/14/17  Yes [provider]  predniSONE (STERAPRED UNI-PAK 21 TAB) 10 MG (21) TBPK tablet TAKE 6 TABLETS ON DAY 1 AS DIRECTED ON PACKAGE AND DECREASE BY 1 TAB EACH DAY FOR A TOTAL OF 6 DAYS 04/02/17  Yes [provider]  promethazine (PHENERGAN) 12.5 MG tablet 1 (ONE) TABLET BY MOUTH EVERY FOUR HOURS, AS NEEDED 12/03/15  Yes [provider]  SUMAtriptan (IMITREX) 100 MG tablet ! Tab at onset of HA. May repeat in 2 hours if headache persists or recurs.No more than 4 doses a month. 08/29/14  Yes [name], MD  buPROPion (WELLBUTRIN) 100 MG tablet Take 200 mg by mouth 2 (two) times daily. 04/14/17   [provider]  REXULTI 1 MG TABS Take 1 tablet by mouth daily. 05/11/17   [provider]  Past Medical History:  Diagnosis Date  . Anxiety   . Asthma   . Depression   . GERD (gastroesophageal reflux disease)   . Heel spur    bilat  . History of bronchitis   . History of chicken pox   . History of urinary tract infection   . Migraines   . Plantar fasciitis    bilat  . STD (sexually transmitted disease)    chl hx & hsv 1&2  . Tremors of nervous system   . Urinary incontinence    Social History   Socioeconomic History  . Marital status: Single    Spouse name: Not on file  . Number of children: Not on file  . Years of  education: 712  . Highest education level: Not on file  Social Needs  . Financial resource strain: Not on file  . Food insecurity - worry: Not on file  . Food insecurity - inability: Not on file  . Transportation needs - medical: Not on file  . Transportation needs - non-medical: Not on file  Occupational History  . Occupation: Chartered certified accountantCosmetics    Employer: [employer]  Tobacco Use  . Smoking status: Former Smoker    Packs/day: 0.25    Years: 5.00    Pack years: 1.25    Types: Cigarettes    Last attempt to quit: 03/10/2012    Years since quitting: 5.1  . Smokeless tobacco: Never Used  Substance and Sexual Activity  . Alcohol use: Yes    Alcohol/week: 0.0 oz    Comment: 1 a month  . Drug use: No  . Sexual activity: Yes    Partners: Male    Birth control/protection: Condom  Other Topics Concern  . Not on file  Social History Narrative   Regular exercise-no   Caffeine Use-yes, 1-2 cups of caffeine daily   Family History  Problem Relation Age of Onset  . Diabetes Mother   . Hypertension Mother   . Hyperlipidemia Mother   . Kidney disease Mother   . Cancer Father        pancreatic  . Hypertension Maternal Grandmother   . Diabetes Maternal Grandmother   . Heart failure Maternal Grandmother        CHF  . Heart attack Maternal Grandfather   . Hypertension Maternal Grandfather   . Hypertension Paternal Grandmother   . Alzheimer's disease Paternal Grandmother   . Hypertension Paternal Grandfather   . Cancer Maternal Uncle        melanoma  . Cancer Paternal Uncle        kidney   Past Surgical History:  Procedure Laterality Date  . LAPAROSCOPIC GASTRIC SLEEVE RESECTION N/A 11/27/2014   Procedure: LAPAROSCOPIC GASTRIC SLEEVE RESECTION WITH HIATAL HERNIA REPAIR UPPER ENDOSCOPY;  Surgeon: Luretha MurphyMatthew Martin, MD;  Location: [facility];  Service: General;  Laterality: N/A;  . TONSILLECTOMY  1978     Review of Systems  Constitutional: Positive for appetite change and fatigue.  Negative for activity change, chills, diaphoresis, fever and unexpected weight change.  HENT: Positive for congestion, postnasal drip, rhinorrhea (Yellow nasal discharge.), sinus pressure, sinus pain, sore throat and voice change (Hoarseness.). Negative for ear discharge, ear pain, tinnitus and trouble swallowing.   Eyes: Negative.  Negative for pain, discharge and redness.  Respiratory: Positive for cough, chest tightness (better today - used albuterol and she got relief), shortness of breath (more from nasal congestion or when she is actively coughing) and wheezing (none today - albuterol helped).  Negative for choking and stridor.   Cardiovascular: Negative.  Negative for chest pain and palpitations.  Gastrointestinal: Negative.  Negative for constipation, diarrhea, nausea and vomiting.  Genitourinary: Negative.  Negative for difficulty urinating and dysuria.  Musculoskeletal: Positive for myalgias. Negative for arthralgias.  Skin: Negative for color change and rash.  Neurological: Positive for dizziness and weakness. Negative for light-headedness and headaches.  Psychiatric/Behavioral: Positive for sleep disturbance.       Objective:   Physical Exam  Constitutional: She is oriented to person, place, and time. She appears well-developed and well-nourished.  BP 122/72   Pulse 98   Temp 98.5 F (36.9 C) (Oral)   Resp 18   Ht 5' 6.5" (1.689 m)   Wt (!) 318 lb 12.8 oz (144.6 kg)   LMP 04/25/2017   SpO2 98%   BMI 50.68 kg/m   HENT:  Head: Normocephalic and atraumatic.  Left Ear: External ear normal.  Right TM slightly injected. Nose edematous and erythematous. Mucus noted in between turbinates.   Eyes: Conjunctivae and EOM are normal. Pupils are equal, round, and reactive to light.  Neck: Normal range of motion. Neck supple.  Cardiovascular: Normal rate, regular rhythm, normal heart sounds and intact distal pulses.  Pulmonary/Chest: Effort normal and breath sounds normal.  Abdominal:  Soft. Bowel sounds are normal.  Musculoskeletal: Normal range of motion.  Neurological: She is alert and oriented to person, place, and time. She has normal reflexes.  Skin: Skin is warm and dry. No rash noted. No erythema. No pallor.  Psychiatric: She has a normal mood and affect. Her behavior is normal. Judgment and thought content normal.      Assessment & Plan:  Cough - Plan: HYDROcodone-homatropine (HYCODAN) 5-1.5 MG/5ML syrup  URI with cough and congestion - Plan: doxycycline (VIBRA-TABS) 100 MG tablet, fluconazole (DIFLUCAN) 150 MG tablet, Guaifenesin (MUCINEX MAXIMUM STRENGTH) 1200 MG TB12  Mild asthma with acute exacerbation, unspecified whether persistent  Gastroesophageal reflux disease, esophagitis presence not specified  Pt is on prednisone for her asthma - she will change to am dosing.  She was instructed to be mindful of her heartburn and she was instructed to increase her use of pepcid if she develops symptoms of indigestion.  She will do scheduled doses of albuterol over the next several days until the prednisone starts to work.  She will change to mucinex to help with thinning of mucus.  This sounds like a URI with asthma exacerbation but she is on day 10 and not improving so we will cover with Doxy.  She will continue home symptomatic treatment with nasal saline and humid air into bedroom.  F/u if no improvement in 3-4 days.     Benny Lennert PA-C  Primary Care at [facility] 05/15/2017 12:47 PM

## 2017-05-15 NOTE — Patient Instructions (Signed)
     IF you received an x-ray today, you will receive an invoice from [facility]. Please contact [facility] at [phone] with questions or concerns regarding your invoice.   IF you received labwork today, you will receive an invoice from [facility]. Please contact [facility] at [phone] with questions or concerns regarding your invoice.   Our billing staff will not be able to assist you with questions regarding bills from these companies.  You will be contacted with the lab results as soon as they are available. The fastest way to get your results is to activate your My Chart account. Instructions are located on the last page of this paperwork. If you have not heard from us regarding the results in 2 weeks, please contact this office.     

## 2017-07-02 ENCOUNTER — Encounter (HOSPITAL_COMMUNITY): Payer: Self-pay

## 2017-09-01 ENCOUNTER — Ambulatory Visit: Payer: BLUE CROSS/BLUE SHIELD | Admitting: Emergency Medicine

## 2017-09-01 ENCOUNTER — Other Ambulatory Visit: Payer: Self-pay

## 2017-09-01 ENCOUNTER — Encounter: Payer: Self-pay | Admitting: Emergency Medicine

## 2017-09-01 VITALS — BP 113/67 | HR 88 | Temp 98.7°F | Resp 16 | Ht 66.5 in | Wt 342.2 lb

## 2017-09-01 DIAGNOSIS — K219 Gastro-esophageal reflux disease without esophagitis: Secondary | ICD-10-CM | POA: Diagnosis not present

## 2017-09-01 DIAGNOSIS — G43009 Migraine without aura, not intractable, without status migrainosus: Secondary | ICD-10-CM | POA: Diagnosis not present

## 2017-09-01 MED ORDER — PANTOPRAZOLE SODIUM 40 MG PO TBEC: 40.0000 mg | DELAYED_RELEASE_TABLET | Freq: Every day | ORAL | 3 refills | Status: DC

## 2017-09-01 MED ORDER — SUMATRIPTAN SUCCINATE 100 MG PO TABS: ORAL_TABLET | ORAL | 5 refills | Status: DC

## 2017-09-01 NOTE — Progress Notes (Signed)
Alyssa Blanchard 47 y.o.   Chief Complaint  Patient presents with  . Medication Refill    protonix and Imitrex    HISTORY OF PRESENT ILLNESS: This is a 47 y.o. female here for follow-up.  Needs medication refill of Protonix and Imitrex.  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  Has history of GERD.  Also has a history of occasional migraine headaches for which she takes Imitrex as needed.  Had 2 episodes last month she feels were medication induced while she was at a detox center down  at [facility].  HPI   Prior to Admission medications   Medication Sig Start Date End Date Taking? Authorizing Provider  acetaminophen (TYLENOL) 500 MG tablet Take 1,000 mg by mouth every 6 (six) hours as needed for mild pain.   Yes [provider]  albuterol (PROVENTIL HFA;VENTOLIN HFA) 108 (90 BASE) MCG/ACT inhaler Inhale 2 puffs into the lungs every 6 (six) hours as needed. 07/09/13  Yes Elvina Sidle, MD  ALPRAZolam [name]) 1 MG tablet TAKE 1/2 TO 1 TABLET BY MOUTH EVERY 12 HOURS AS NEEDED (FILLABLE 01/24/16) 01/16/16  Yes [provider]  buPROPion (WELLBUTRIN) 100 MG tablet Take 200 mg by mouth 2 (two) times daily. 04/14/17  Yes [provider]  Multiple Vitamin (MULTIVITAMIN WITH MINERALS) TABS tablet Take 1 tablet by mouth daily.   Yes [provider]  pantoprazole (PROTONIX) 40 MG tablet Take 40 mg by mouth daily.   Yes [provider]  SUMAtriptan (IMITREX) 100 MG tablet ! Tab at onset of HA. May repeat in 2 hours if headache persists or recurs.No more than 4 doses a month. 08/29/14  Yes [name], MD  buPROPion (WELLBUTRIN XL) 300 MG 24 hr tablet Take 300 mg by mouth every morning.  08/09/14   [provider]  cyclobenzaprine (FLEXERIL) 5 MG tablet Take 1 tablet (5 mg total) by mouth at bedtime. Patient not taking: Reported on 09/01/2017 04/02/17   Doristine Bosworth, MD  famotidine (PEPCID) 20 MG  tablet Take 20 mg by mouth 2 (two) times daily.    [provider]  predniSONE (STERAPRED UNI-PAK 21 TAB) 10 MG (21) TBPK tablet TAKE 6 TABLETS ON DAY 1 AS DIRECTED ON PACKAGE AND DECREASE BY 1 TAB EACH DAY FOR A TOTAL OF 6 DAYS 04/02/17   [provider]  promethazine (PHENERGAN) 12.5 MG tablet 1 (ONE) TABLET BY MOUTH EVERY FOUR HOURS, AS NEEDED 12/03/15   [provider]  REXULTI 1 MG TABS Take 1 tablet by mouth daily. 05/11/17   [provider]    Allergies  Allergen Reactions  . Gabapentin Anaphylaxis    Patient Active Problem List   Diagnosis Date Noted  . Cough 12/31/2016  . Dysuria 07/28/2016  . Clinical infection 07/28/2016  . History of asthma 04/17/2016  . Acute bronchitis 04/17/2016  . 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 Migraine without aura and without status migrainosus, not intractable 10/10/2014  . Migraine with aura and without status migrainosus, not intractable 08/29/2014  . 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 classified elsewhere 08/29/2014  . 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 Snorings 07/10/2014  . Sleep related headaches 07/10/2014  . Nocturia more than twice per night 07/10/2014  . Preventative health care 07/30/2012  . Morbid obesity (HCC) 07/30/2012    Past Medical History:  Diagnosis Date  . Anxiety   . Asthma   . Depression   .  GERD (gastroesophageal reflux disease)   . Heel spur    bilat  . History of bronchitis   . History of chicken pox   . History of urinary tract infection   . Migraines   . Plantar fasciitis    bilat  . STD (sexually transmitted disease)    chl hx & hsv 1&2  . Tremors of nervous system   . Urinary incontinence     Past Surgical History:  Procedure Laterality Date  . LAPAROSCOPIC GASTRIC SLEEVE RESECTION N/A 11/27/2014   Procedure: LAPAROSCOPIC GASTRIC SLEEVE RESECTION WITH HIATAL HERNIA REPAIR UPPER ENDOSCOPY;  Surgeon: Luretha Murphy, MD;   Location: [facility];  Service: General;  Laterality: N/A;  . TONSILLECTOMY  1978    Social History   Socioeconomic History  . Marital status: Single    Spouse name: Not on file  . Number of children: Not on file  . Years of education: 31  . Highest education level: Not on file  Occupational History  . Occupation: Chartered certified accountant: [employer]  Social Needs  . Financial resource strain: Not on file  . Food insecurity:    Worry: Not on file    Inability: Not on file  . Transportation needs:    Medical: Not on file    Non-medical: Not on file  Tobacco Use  . Smoking status: Former Smoker    Packs/day: 0.25    Years: 5.00    Pack years: 1.25    Types: Cigarettes    Last attempt to quit: 03/10/2012    Years since quitting: 5.4  . Smokeless tobacco: Never Used  Substance and Sexual Activity  . Alcohol use: Yes    Alcohol/week: 0.0 oz    Comment: 1 a month  . Drug use: No  . Sexual activity: Yes    Partners: Male    Birth control/protection: Condom  Lifestyle  . Physical activity:    Days per week: Not on file    Minutes per session: Not on file  . Stress: Not on file  Relationships  . Social connections:    Talks on phone: Not on file    Gets together: Not on file    Attends religious service: Not on file    Active member of club or organization: Not on file    Attends meetings of clubs or organizations: Not on file    Relationship status: Not on file  . Intimate partner violence:    Fear of current or ex partner: Not on file    Emotionally abused: Not on file    Physically abused: Not on file    Forced sexual activity: Not on file  Other Topics Concern  . Not on file  Social History Narrative   Regular exercise-no   Caffeine Use-yes, 1-2 cups of caffeine daily    Family History  Problem Relation Age of Onset  . Diabetes Mother   . Hypertension Mother   . Hyperlipidemia Mother   . Kidney disease Mother   . Cancer Father        pancreatic  .  Hypertension Maternal Grandmother   . Diabetes Maternal Grandmother   . Heart failure Maternal Grandmother        CHF  . Heart attack Maternal Grandfather   . Hypertension Maternal Grandfather   . Hypertension Paternal Grandmother   . Alzheimer's disease Paternal Grandmother   . Hypertension Paternal Grandfather   . Cancer Maternal Uncle  melanoma  . Cancer Paternal Uncle        kidney     Review of Systems  Constitutional: Negative.  Negative for chills and fever.  HENT: Negative.   Eyes: Negative.   Respiratory: Negative.  Negative for cough and shortness of breath.   Cardiovascular: Negative.  Negative for chest pain and palpitations.  Gastrointestinal: Positive for heartburn and nausea. Negative for abdominal pain, blood in stool, diarrhea, melena and vomiting.  Genitourinary: Negative.  Negative for dysuria and hematuria.  Musculoskeletal: Negative for back pain, myalgias and neck pain.  Skin: Negative.  Negative for rash.  Neurological: Positive for headaches. Negative for dizziness.  Endo/Heme/Allergies: Negative.   All other systems reviewed and are negative.     Vitals:   09/01/17 1153  BP: 113/67  Pulse: 88  Resp: 16  Temp: 98.7 F (37.1 C)  SpO2: 96%    Physical Exam  Constitutional: She is oriented to person, place, and time. She appears well-developed.  Obese  HENT:  Head: Normocephalic and atraumatic.  Nose: Nose normal.  Mouth/Throat: Oropharynx is clear and moist.  Eyes: Pupils are equal, round, and reactive to light. Conjunctivae and EOM are normal.  Neck: Normal range of motion. Neck supple. No JVD present. No thyromegaly present.  Cardiovascular: Normal rate, regular rhythm and normal heart sounds.  Pulmonary/Chest: Effort normal and breath sounds normal.  Musculoskeletal: Normal range of motion.  Lymphadenopathy:    She has no cervical adenopathy.  Neurological: She is alert and oriented to person, place, and time.  Skin: Skin is warm  and dry.  Psychiatric: She has a normal mood and affect. Her behavior is normal.  Vitals reviewed.    ASSESSMENT & PLAN: Alyssa Blanchard was seen today for medication refill.  Diagnoses and all orders for this visit:  Gastroesophageal reflux disease, esophagitis presence not specified -     pantoprazole (PROTONIX) 40 MG tablet; Take 1 tablet (40 mg total) by mouth daily.  Migraine without aura and without status migrainosus, not intractable -     SUMAtriptan (IMITREX) 100 MG tablet; ! Tab at onset of HA. May repeat in 2 hours if headache persists or recurs.No more than 4 doses a month.    Patient Instructions       IF you received an x-ray today, you will receive an [facility] from [facility]. Please contact [facility] at [phone] with questions or concerns regarding your invoice.   IF you received labwork today, you will receive an invoice from [facility]. Please contact [facility] at [phone] with questions or concerns regarding your invoice.   Our billing staff will not be able to assist you with questions regarding bills from these companies.  You will be contacted with the lab results as soon as they are available. The fastest way to get your results is to activate your My Chart account. Instructions are located on the last page of this paperwork. If you have not heard from us regarding the results in 2 weeks, please contact this office.     Gastroesophageal Reflux Disease, Adult Normally, food travels down the esophagus and stays in the stomach to be digested. If a person has gastroesophageal reflux disease (GERD), food and stomach acid move back up into the esophagus. When this happens, the esophagus becomes sore and swollen (inflamed). Over time, GERD can make small holes (ulcers) in the lining of the esophagus. Follow these instructions at home: Diet  Follow a diet as told by your doctor. You may need to  avoid foods and drinks such as: ? Coffee and tea  (with or without caffeine). ? Drinks that contain alcohol. ? Energy drinks and sports drinks. ? Carbonated drinks or sodas. ? Chocolate and cocoa. ? Peppermint and mint flavorings. ? Garlic and onions. ? Horseradish. ? Spicy and acidic foods, such as peppers, chili powder, curry powder, vinegar, hot sauces, and BBQ sauce. ? Citrus fruit juices and citrus fruits, such as oranges, lemons, and limes. ? Tomato-based foods, such as red sauce, chili, salsa, and pizza with red sauce. ? Fried and fatty foods, such as donuts, french fries, potato chips, and high-fat dressings. ? High-fat meats, such as hot dogs, rib eye steak, sausage, ham, and bacon. ? High-fat dairy items, such as whole milk, butter, and cream cheese.  Eat small meals often. Avoid eating large meals.  Avoid drinking large amounts of liquid with your meals.  Avoid eating meals during the 2-3 hours before bedtime.  Avoid lying down right after you eat.  Do not exercise right after you eat. General instructions  Pay attention to any changes in your symptoms.  Take over-the-counter and prescription medicines only as told by your doctor. Do not take aspirin, ibuprofen, or other NSAIDs unless your doctor says it is okay.  Do not use any tobacco products, including cigarettes, chewing tobacco, and e-cigarettes. If you need help quitting, ask your doctor.  Wear loose clothes. Do not wear anything tight around your waist.  Raise (elevate) the head of your bed about 6 inches (15 cm).  Try to lower your stress. If you need help doing this, ask your doctor.  If you are overweight, lose an amount of weight that is healthy for you. Ask your doctor about a safe weight loss goal.  Keep all follow-up visits as told by your doctor. This is important. Contact a doctor if:  You have new symptoms.  You lose weight and you do not know why it is happening.  You have trouble swallowing, or it hurts to swallow.  You have wheezing or  a cough that keeps happening.  Your symptoms do not get better with treatment.  You have a hoarse voice. Get help right away if:  You have pain in your arms, neck, jaw, teeth, or back.  You feel sweaty, dizzy, or light-headed.  You have chest pain or shortness of breath.  You throw up (vomit) and your throw up looks like blood or coffee grounds.  You pass out (faint).  Your poop (stool) is bloody or black.  You cannot swallow, drink, or eat. This information is not intended to replace advice given to you by your health care provider. Make sure you discuss any questions you have with your health care provider. Document Released: 08/13/2007 Document Revised: 08/02/2015 Document Reviewed: 06/21/2014 Elsevier Interactive Patient Education  2018 Elsevier Inc.      Edwina Barth, MD [facility]

## 2017-09-01 NOTE — Patient Instructions (Addendum)
   IF you received an x-ray today, you will receive an invoice from [facility]. Please contact [facility] at [phone] with questions or concerns regarding your invoice.   IF you received labwork today, you will receive an invoice from [facility]. Please contact [facility] at [phone] with questions or concerns regarding your invoice.   Our billing staff will not be able to assist you with questions regarding bills from these companies.  You will be contacted with the lab results as soon as they are available. The fastest way to get your results is to activate your My Chart account. Instructions are located on the last page of this paperwork. If you have not heard from us regarding the results in 2 weeks, please contact this office.     Gastroesophageal Reflux Disease, Adult Normally, food travels down the esophagus and stays in the stomach to be digested. If a person has gastroesophageal reflux disease (GERD), food and stomach acid move back up into the esophagus. When this happens, the esophagus becomes sore and swollen (inflamed). Over time, GERD can make small holes (ulcers) in the lining of the esophagus. Follow these instructions at home: Diet  Follow a diet as told by your doctor. You may need to avoid foods and drinks such as: ? Coffee and tea (with or without caffeine). ? Drinks that contain alcohol. ? Energy drinks and sports drinks. ? Carbonated drinks or sodas. ? Chocolate and cocoa. ? Peppermint and mint flavorings. ? Garlic and onions. ? Horseradish. ? Spicy and acidic foods, such as peppers, chili powder, curry powder, vinegar, hot sauces, and BBQ sauce. ? Citrus fruit juices and citrus fruits, such as oranges, lemons, and limes. ? Tomato-based foods, such as red sauce, chili, salsa, and pizza with red sauce. ? Fried and fatty foods, such as donuts, french fries, potato chips, and high-fat dressings. ? High-fat meats, such as hot dogs, rib eye  steak, sausage, ham, and bacon. ? High-fat dairy items, such as whole milk, butter, and cream cheese.  Eat small meals often. Avoid eating large meals.  Avoid drinking large amounts of liquid with your meals.  Avoid eating meals during the 2-3 hours before bedtime.  Avoid lying down right after you eat.  Do not exercise right after you eat. General instructions  Pay attention to any changes in your symptoms.  Take over-the-counter and prescription medicines only as told by your doctor. Do not take aspirin, ibuprofen, or other NSAIDs unless your doctor says it is okay.  Do not use any tobacco products, including cigarettes, chewing tobacco, and e-cigarettes. If you need help quitting, ask your doctor.  Wear loose clothes. Do not wear anything tight around your waist.  Raise (elevate) the head of your bed about 6 inches (15 cm).  Try to lower your stress. If you need help doing this, ask your doctor.  If you are overweight, lose an amount of weight that is healthy for you. Ask your doctor about a safe weight loss goal.  Keep all follow-up visits as told by your doctor. This is important. Contact a doctor if:  You have new symptoms.  You lose weight and you do not know why it is happening.  You have trouble swallowing, or it hurts to swallow.  You have wheezing or a cough that keeps happening.  Your symptoms do not get better with treatment.  You have a hoarse voice. Get help right away if:  You have pain in your arms, neck, jaw, teeth, or   back.  You feel sweaty, dizzy, or light-headed.  You have chest pain or shortness of breath.  You throw up (vomit) and your throw up looks like blood or coffee grounds.  You pass out (faint).  Your poop (stool) is bloody or black.  You cannot swallow, drink, or eat. This information is not intended to replace advice given to you by your health care provider. Make sure you discuss any questions you have with your health care  provider. Document Released: 08/13/2007 Document Revised: 08/02/2015 Document Reviewed: 06/21/2014 Elsevier Interactive Patient Education  2018 Elsevier Inc.  

## 2017-11-29 ENCOUNTER — Other Ambulatory Visit: Payer: Self-pay

## 2017-11-29 ENCOUNTER — Emergency Department (HOSPITAL_COMMUNITY): Payer: Self-pay

## 2017-11-29 ENCOUNTER — Encounter (HOSPITAL_COMMUNITY): Payer: Self-pay | Admitting: Emergency Medicine

## 2017-11-29 ENCOUNTER — Inpatient Hospital Stay (HOSPITAL_COMMUNITY)
Admission: EM | Admit: 2017-11-29 | Discharge: 2017-12-01 | DRG: 189 | Disposition: A | Discharge status: Home / Self Care | Payer: Self-pay | Attending: Internal Medicine | Admitting: Internal Medicine

## 2017-11-29 DIAGNOSIS — Z808 Family history of malignant neoplasm of other organs or systems: Secondary | ICD-10-CM

## 2017-11-29 DIAGNOSIS — Z23 Encounter for immunization: Secondary | ICD-10-CM

## 2017-11-29 DIAGNOSIS — J45902 Unspecified asthma with status asthmaticus: Secondary | ICD-10-CM | POA: Diagnosis present

## 2017-11-29 DIAGNOSIS — R0602 Shortness of breath: Secondary | ICD-10-CM

## 2017-11-29 DIAGNOSIS — J440 Chronic obstructive pulmonary disease with acute lower respiratory infection: Secondary | ICD-10-CM | POA: Diagnosis present

## 2017-11-29 DIAGNOSIS — K219 Gastro-esophageal reflux disease without esophagitis: Secondary | ICD-10-CM | POA: Diagnosis present

## 2017-11-29 DIAGNOSIS — Z6841 Body Mass Index (BMI) 40.0 and over, adult: Secondary | ICD-10-CM

## 2017-11-29 DIAGNOSIS — Z8349 Family history of other endocrine, nutritional and metabolic diseases: Secondary | ICD-10-CM

## 2017-11-29 DIAGNOSIS — F419 Anxiety disorder, unspecified: Secondary | ICD-10-CM | POA: Diagnosis present

## 2017-11-29 DIAGNOSIS — Z888 Allergy status to other drugs, medicaments and biological substances status: Secondary | ICD-10-CM

## 2017-11-29 DIAGNOSIS — J9601 Acute respiratory failure with hypoxia: Principal | ICD-10-CM | POA: Diagnosis present

## 2017-11-29 DIAGNOSIS — Z820 Family history of epilepsy and other diseases of the nervous system: Secondary | ICD-10-CM

## 2017-11-29 DIAGNOSIS — Z841 Family history of disorders of kidney and ureter: Secondary | ICD-10-CM

## 2017-11-29 DIAGNOSIS — Z8249 Family history of ischemic heart disease and other diseases of the circulatory system: Secondary | ICD-10-CM

## 2017-11-29 DIAGNOSIS — F329 Major depressive disorder, single episode, unspecified: Secondary | ICD-10-CM | POA: Diagnosis present

## 2017-11-29 DIAGNOSIS — J45901 Unspecified asthma with (acute) exacerbation: Secondary | ICD-10-CM | POA: Diagnosis present

## 2017-11-29 DIAGNOSIS — Z9089 Acquired absence of other organs: Secondary | ICD-10-CM

## 2017-11-29 DIAGNOSIS — I10 Essential (primary) hypertension: Secondary | ICD-10-CM | POA: Diagnosis present

## 2017-11-29 DIAGNOSIS — J4522 Mild intermittent asthma with status asthmaticus: Secondary | ICD-10-CM

## 2017-11-29 DIAGNOSIS — Z87891 Personal history of nicotine dependence: Secondary | ICD-10-CM

## 2017-11-29 DIAGNOSIS — J209 Acute bronchitis, unspecified: Secondary | ICD-10-CM | POA: Diagnosis present

## 2017-11-29 DIAGNOSIS — Z833 Family history of diabetes mellitus: Secondary | ICD-10-CM

## 2017-11-29 DIAGNOSIS — Z8709 Personal history of other diseases of the respiratory system: Secondary | ICD-10-CM

## 2017-11-29 LAB — CBC WITH DIFFERENTIAL/PLATELET
Basophils Absolute: 0 10*3/uL (ref 0.0–0.1)
Basophils Relative: 0 %
Eosinophils Absolute: 0.2 10*3/uL (ref 0.0–0.7)
Eosinophils Relative: 2 %
HCT: 44.7 % (ref 36.0–46.0)
Hemoglobin: 14.4 g/dL (ref 12.0–15.0)
Lymphocytes Relative: 8 %
Lymphs Abs: 0.9 10*3/uL (ref 0.7–4.0)
MCH: 28.5 pg (ref 26.0–34.0)
MCHC: 32.2 g/dL (ref 30.0–36.0)
MCV: 88.3 fL (ref 78.0–100.0)
Monocytes Absolute: 0.9 10*3/uL (ref 0.1–1.0)
Monocytes Relative: 8 %
Neutro Abs: 9.5 10*3/uL — ABNORMAL HIGH (ref 1.7–7.7)
Neutrophils Relative %: 82 %
Platelets: 218 10*3/uL (ref 150–400)
RBC: 5.06 MIL/uL (ref 3.87–5.11)
RDW: 14.5 % (ref 11.5–15.5)
WBC: 11.6 10*3/uL — ABNORMAL HIGH (ref 4.0–10.5)

## 2017-11-29 LAB — BASIC METABOLIC PANEL
Anion gap: 12 (ref 5–15)
BUN: 9 mg/dL (ref 6–20)
CO2: 23 mmol/L (ref 22–32)
Calcium: 8.9 mg/dL (ref 8.9–10.3)
Chloride: 105 mmol/L (ref 98–111)
Creatinine, Ser: 0.97 mg/dL (ref 0.44–1.00)
GFR calc Af Amer: >60 mL/min (ref >60)
GFR calc non Af Amer: >60 mL/min (ref >60)
Glucose, Bld: 98 mg/dL (ref 70–99)
Potassium: 4.1 mmol/L (ref 3.5–5.1)
Sodium: 140 mmol/L (ref 135–145)

## 2017-11-29 LAB — BLOOD GAS, VENOUS
Acid-Base Excess: 0.5 mmol/L (ref 0.0–2.0)
Bicarbonate: 26.8 mmol/L (ref 20.0–28.0)
O2 Saturation: 25.7 %
Patient temperature: 98.6
pCO2, Ven: 51.6 mmHg (ref 44.0–60.0)
pH, Ven: 7.335 (ref 7.250–7.430)

## 2017-11-29 MED ORDER — PREDNISONE 50 MG PO TABS: 50.0000 mg | ORAL_TABLET | Freq: Every day | ORAL | Status: DC

## 2017-11-29 MED ORDER — ALBUTEROL (5 MG/ML) CONTINUOUS INHALATION SOLN
10.0000 mg/h | INHALATION_SOLUTION | Freq: Once | RESPIRATORY_TRACT | Status: AC
  Administered 2017-11-29: 10 mg/h via RESPIRATORY_TRACT
  Filled 2017-11-29: qty 20

## 2017-11-29 MED ORDER — METHYLPREDNISOLONE SODIUM SUCC 125 MG IJ SOLR
125.0000 mg | Freq: Once | INTRAMUSCULAR | Status: AC
  Administered 2017-11-29: 125 mg via INTRAVENOUS
  Filled 2017-11-29: qty 2

## 2017-11-29 MED ORDER — ENOXAPARIN SODIUM 40 MG/0.4ML ~~LOC~~ SOLN
40.0000 mg | SUBCUTANEOUS | Status: DC
  Administered 2017-11-29 – 2017-11-30 (×2): 40 mg via SUBCUTANEOUS
  Filled 2017-11-29 (×3): qty 0.4

## 2017-11-29 MED ORDER — IPRATROPIUM-ALBUTEROL 0.5-2.5 (3) MG/3ML IN SOLN
3.0000 mL | Freq: Once | RESPIRATORY_TRACT | Status: AC
  Administered 2017-11-29: 3 mL via RESPIRATORY_TRACT
  Filled 2017-11-29: qty 3

## 2017-11-29 MED ORDER — ALBUTEROL SULFATE (2.5 MG/3ML) 0.083% IN NEBU
5.0000 mg | INHALATION_SOLUTION | Freq: Once | RESPIRATORY_TRACT | Status: AC
  Administered 2017-11-29: 5 mg via RESPIRATORY_TRACT
  Filled 2017-11-29: qty 6

## 2017-11-29 MED ORDER — INFLUENZA VAC SPLIT QUAD 0.5 ML IM SUSY
0.5000 mL | PREFILLED_SYRINGE | INTRAMUSCULAR | Status: AC
  Administered 2017-11-30: 0.5 mL via INTRAMUSCULAR
  Filled 2017-11-29: qty 0.5

## 2017-11-29 MED ORDER — MAGNESIUM SULFATE 2 GM/50ML IV SOLN
2.0000 g | Freq: Once | INTRAVENOUS | Status: AC
  Administered 2017-11-29: 2 g via INTRAVENOUS
  Filled 2017-11-29: qty 50

## 2017-11-29 MED ORDER — IPRATROPIUM BROMIDE 0.02 % IN SOLN
0.5000 mg | Freq: Four times a day (QID) | RESPIRATORY_TRACT | Status: DC
  Administered 2017-11-29: 0.5 mg via RESPIRATORY_TRACT
  Filled 2017-11-29: qty 2.5

## 2017-11-29 MED ORDER — IPRATROPIUM-ALBUTEROL 0.5-2.5 (3) MG/3ML IN SOLN
3.0000 mL | Freq: Four times a day (QID) | RESPIRATORY_TRACT | Status: DC
  Administered 2017-11-30 – 2017-12-01 (×6): 3 mL via RESPIRATORY_TRACT
  Filled 2017-11-29 (×6): qty 3

## 2017-11-29 MED ORDER — AZITHROMYCIN 250 MG PO TABS
500.0000 mg | ORAL_TABLET | Freq: Every day | ORAL | Status: AC
  Administered 2017-11-29: 500 mg via ORAL
  Filled 2017-11-29: qty 2

## 2017-11-29 MED ORDER — AZITHROMYCIN 250 MG PO TABS
250.0000 mg | ORAL_TABLET | Freq: Every day | ORAL | Status: DC
  Administered 2017-11-30 – 2017-12-01 (×2): 250 mg via ORAL
  Filled 2017-11-29 (×2): qty 1

## 2017-11-29 MED ORDER — GUAIFENESIN-DM 100-10 MG/5ML PO SYRP
5.0000 mL | ORAL_SOLUTION | ORAL | Status: DC | PRN
  Administered 2017-11-29 – 2017-11-30 (×2): 5 mL via ORAL
  Filled 2017-11-29 (×3): qty 10

## 2017-11-29 MED ORDER — PNEUMOCOCCAL VAC POLYVALENT 25 MCG/0.5ML IJ INJ
0.5000 mL | INJECTION | INTRAMUSCULAR | Status: AC
  Administered 2017-11-30: 0.5 mL via INTRAMUSCULAR
  Filled 2017-11-29: qty 0.5

## 2017-11-29 MED ORDER — ALBUTEROL SULFATE (2.5 MG/3ML) 0.083% IN NEBU: 2.5000 mg | INHALATION_SOLUTION | RESPIRATORY_TRACT | Status: DC | PRN

## 2017-11-29 MED ORDER — PREDNISONE 20 MG PO TABS
40.0000 mg | ORAL_TABLET | Freq: Every day | ORAL | Status: DC
  Administered 2017-11-30: 40 mg via ORAL
  Filled 2017-11-29: qty 2

## 2017-11-29 MED ORDER — BENZONATATE 100 MG PO CAPS
200.0000 mg | ORAL_CAPSULE | Freq: Three times a day (TID) | ORAL | Status: DC | PRN
  Administered 2017-11-29 – 2017-12-01 (×4): 200 mg via ORAL
  Filled 2017-11-29 (×4): qty 2

## 2017-11-29 MED ORDER — PANTOPRAZOLE SODIUM 40 MG PO TBEC
40.0000 mg | DELAYED_RELEASE_TABLET | Freq: Every day | ORAL | Status: DC
  Administered 2017-11-30 – 2017-12-01 (×2): 40 mg via ORAL
  Filled 2017-11-29 (×2): qty 1

## 2017-11-29 NOTE — ED Notes (Signed)
ED TO INPATIENT HANDOFF REPORT  Name/Age/Gender Alyssa Blanchard 47 y.o. female  Code Status    Code Status Orders  (From admission, onward)         Start     Ordered   11/29/17 1920  Full code  Continuous     11/29/17 1927        Code Status History    Date Active Date Inactive Code Status Order ID Comments User Context   11/27/2014 1826 11/30/2014 1725 Full Code [id number]  Johnathan Hausen, MD Inpatient      Home/SNF/Other Home  Chief Complaint asthma/low oxygen  Level of Care/Admitting Diagnosis ED Disposition    ED Disposition Condition [facility] Area: [facility] [[id number]]  Level of Care: Med-Surg [16]  Diagnosis: Status asthmaticus [220254]  Admitting Physician: Etta Quill [[id number]]  Attending Physician: Etta Quill [[id number]]  PT Class (Do Not Modify): Observation [104]  PT Acc Code (Do Not Modify): Observation [10022]       Medical History Past Medical History:  Diagnosis Date  . Anxiety   . Asthma   . Depression   . GERD (gastroesophageal reflux disease)   . Heel spur    bilat  . History of bronchitis   . History of chicken pox   . History of urinary tract infection   . Migraines   . Plantar fasciitis    bilat  . STD (sexually transmitted disease)    chl hx & hsv 1&2  . Tremors of nervous system   . Urinary incontinence     Allergies Allergies  Allergen Reactions  . Gabapentin Anaphylaxis    IV Location/Drains/Wounds Patient Lines/Drains/Airways Status   Active Line/Drains/Airways    Name:   Placement date:   Placement time:   Site:   Days:   Peripheral IV 11/29/17 Left Hand   11/29/17    1632    Hand   less than 1          Labs/Imaging Results for orders placed or performed during the hospital encounter of 11/29/17 (from the past 48 hour(s))  Basic metabolic panel     Status: None   Collection Time: 11/29/17  4:35 PM  Result Value Ref Range   Sodium 140 135 - 145 mmol/L   Potassium 4.1  3.5 - 5.1 mmol/L   Chloride 105 98 - 111 mmol/L   CO2 23 22 - 32 mmol/L   Glucose, Bld 98 70 - 99 mg/dL   BUN 9 6 - 20 mg/dL   Creatinine, Ser 0.97 0.44 - 1.00 mg/dL   Calcium 8.9 8.9 - 10.3 mg/dL   GFR calc non Af Amer >60 >60 mL/min   GFR calc Af Amer >60 >60 mL/min    Comment: (NOTE) The eGFR has been calculated using the CKD EPI equation. This calculation has not been validated in all clinical situations. eGFR's persistently <60 mL/min signify possible Chronic Kidney Disease.    Anion gap 12 5 - 15    Comment: Performed at [facility], [place] [address] [place], [place] [address]  CBC with Differential     Status: Abnormal   Collection Time: 11/29/17  4:35 PM  Result Value Ref Range   WBC 11.6 (H) 4.0 - 10.5 K/uL   RBC 5.06 3.87 - 5.11 MIL/uL   Hemoglobin 14.4 12.0 - 15.0 g/dL   HCT 44.7 36.0 - 46.0 %   MCV 88.3 78.0 - 100.0 fL   MCH 28.5  26.0 - 34.0 pg   MCHC 32.2 30.0 - 36.0 g/dL   RDW 14.5 11.5 - 15.5 %   Platelets 218 150 - 400 K/uL   Neutrophils Relative % 82 %   Neutro Abs 9.5 (H) 1.7 - 7.7 K/uL   Lymphocytes Relative 8 %   Lymphs Abs 0.9 0.7 - 4.0 K/uL   Monocytes Relative 8 %   Monocytes Absolute 0.9 0.1 - 1.0 K/uL   Eosinophils Relative 2 %   Eosinophils Absolute 0.2 0.0 - 0.7 K/uL   Basophils Relative 0 %   Basophils Absolute 0.0 0.0 - 0.1 K/uL    Comment: Performed at [facility], [place] [address] [place], [place] [address]  Blood gas, venous     Status: None   Collection Time: 11/29/17  4:41 PM  Result Value Ref Range   pH, Ven 7.335 7.250 - 7.430   pCO2, Ven 51.6 44.0 - 60.0 mmHg   pO2, Ven BELOW REPORTABLE RANGE 32.0 - 45.0 mmHg    Comment: RBV J,SHAWN,PA BY LISA CRADDOCK,RRT,RCP ON 11/29/17 AT 1655   Bicarbonate 26.8 20.0 - 28.0 mmol/L   Acid-Base Excess 0.5 0.0 - 2.0 mmol/L   O2 Saturation 25.7 %   Patient temperature 98.6    Collection site VEIN    Drawn by DRAWN BY RN    Sample type VEIN     Comment:  Performed at [facility], [place] [address], [place], [place] [address]   Dg Chest 2 View  Result Date: 11/29/2017 CLINICAL DATA:  Shortness of breath, cough EXAM: CHEST - 2 VIEW COMPARISON:  09/25/2014 FINDINGS: Lungs are clear.  No pleural effusion or pneumothorax. The heart is normal in size. Visualized osseous structures are within normal limits. IMPRESSION: Normal chest radiographs. Electronically Signed   By: Julian Hy M.D.   On: 11/29/2017 15:45    Pending Labs Unresulted Labs (From admission, onward)    Start     Ordered   11/30/17 0500  CBC  Tomorrow morning,   R     11/29/17 1927   11/30/17 0175  Basic metabolic panel  Tomorrow morning,   R     11/29/17 1927   11/29/17 1920  HIV antibody (Routine Testing)  Once,   R     11/29/17 1927          Vitals/Pain Today's Vitals   11/29/17 1548 11/29/17 1711 11/29/17 1818 11/29/17 1828  BP: (!) 146/84  (!) 119/45   Pulse: 92  (!) 108 (!) 106  Resp: (!) 28  (!) 26 (!) 35  Temp:      TempSrc:      SpO2: 92% 90% (!) 89% 94%    Isolation Precautions No active isolations  Medications Medications  pantoprazole (PROTONIX) EC tablet 40 mg (has no administration in time range)  enoxaparin (LOVENOX) injection 40 mg (has no administration in time range)  predniSONE (DELTASONE) tablet 40 mg (has no administration in time range)  albuterol (PROVENTIL) (2.5 MG/3ML) 0.083% nebulizer solution 2.5 mg (has no administration in time range)  ipratropium (ATROVENT) nebulizer solution 0.5 mg (has no administration in time range)  albuterol (PROVENTIL) (2.5 MG/3ML) 0.083% nebulizer solution 5 mg (5 mg Nebulization Given 11/29/17 1410)  ipratropium-albuterol (DUONEB) 0.5-2.5 (3) MG/3ML nebulizer solution 3 mL (3 mLs Nebulization Given 11/29/17 1611)  methylPREDNISolone sodium succinate (SOLU-MEDROL) 125 mg/2 mL injection 125 mg (125 mg Intravenous Given 11/29/17 1633)  albuterol (PROVENTIL,VENTOLIN) solution continuous neb  (10 mg/hr Nebulization Given 11/29/17  1706)  magnesium sulfate IVPB 2 g 50 mL ( Intravenous Stopped 11/29/17 1809)    Mobility walks

## 2017-11-29 NOTE — ED Notes (Signed)
Pt RA sat was 90-91% without speaking. She spoke 5 words and O2 dropped to 89% and came back up to 90 shortly thereafter.

## 2017-11-29 NOTE — ED Provider Notes (Signed)
[facility]-EMERGENCY DEPT Provider Note   CSN: [id number] Arrival date & time: 11/29/17  1355     History   Chief Complaint Chief Complaint  Patient presents with  . Asthma  . Shortness of Breath    HPI Alyssa Blanchard is a 47 y.o. female.  HPI   Alyssa Blanchard is a 47 y.o. female, with a history of asthma, presenting to the ED with shortness of breath for the last 2 days.  Patient endorses onset of productive cough at the same time.  She has been using her albuterol inhaler "at least 8 times a day."  Endorses some chest tightness with coughing.  Patient suspects asthma exacerbation/bronchitis, but states "it has never been this bad before."  It usually resolves with a single breathing treatment. Denies fever/chills, other chest pain, abdominal pain, N/V/D, lower extremity edema or pain, or any other complaints.  Past Medical History:  Diagnosis Date  . Anxiety   . Asthma   . Depression   . GERD (gastroesophageal reflux disease)   . Heel spur    bilat  . History of bronchitis   . History of chicken pox   . History of urinary tract infection   . Migraines   . Plantar fasciitis    bilat  . STD (sexually transmitted disease)    chl hx & hsv 1&2  . Tremors of nervous system   . Urinary incontinence     Patient Active Problem List   Diagnosis Date Noted  . Status asthmaticus 11/29/2017  . Acute respiratory failure with hypoxia (HCC) 11/29/2017  . Gastroesophageal reflux disease 09/01/2017  . Cough 12/31/2016  . Dysuria 07/28/2016  . Clinical infection 07/28/2016  . History of asthma 04/17/2016  . Acute bronchitis 04/17/2016  . 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 Migraine without aura and without status migrainosus, not intractable 10/10/2014  . Migraine with aura and without status migrainosus, not intractable 08/29/2014  . 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 classified elsewhere 08/29/2014  . Hypoventilation associated with obesity syndrome  (HCC) 07/10/2014  . Snorings 07/10/2014  . Sleep related headaches 07/10/2014  . Nocturia more than twice per night 07/10/2014  . Preventative health care 07/30/2012  . Morbid obesity (HCC) 07/30/2012    Past Surgical History:  Procedure Laterality Date  . LAPAROSCOPIC GASTRIC SLEEVE RESECTION N/A 11/27/2014   Procedure: LAPAROSCOPIC GASTRIC SLEEVE RESECTION WITH HIATAL HERNIA REPAIR UPPER ENDOSCOPY;  Surgeon: Luretha Murphy, MD;  Location: [facility]S;  Service: General;  Laterality: N/A;  . TONSILLECTOMY  1978     OB History    Gravida  2   Para      Term      Preterm      AB  2   Living  0     SAB      TAB  2   Ectopic      Multiple      Live Births               Home Medications    Prior to Admission medications   Medication Sig Start Date End Date Taking? Authorizing Provider  acetaminophen (TYLENOL) 500 MG tablet Take 1,000 mg by mouth every 6 (six) hours as needed for mild pain.   Yes [provider]  albuterol (PROVENTIL HFA;VENTOLIN HFA) 108 (90 BASE) MCG/ACT inhaler Inhale 2 puffs into the lungs every 6 (six) hours as needed. 07/09/13  Yes Elvina Sidle, MD  ALPRAZolam [name]) 1 MG tablet TAKE 1/2 TO  1 TABLET BY MOUTH EVERY 12 HOURS AS NEEDED (FILLABLE 01/24/16) 01/16/16  Yes [provider]  buPROPion (WELLBUTRIN XL) 300 MG 24 hr tablet Take 300 mg by mouth every morning.  08/09/14  Yes [provider]  buPROPion (WELLBUTRIN) 100 MG tablet Take 200 mg by mouth 2 (two) times daily. 04/14/17  Yes [provider]  cyclobenzaprine (FLEXERIL) 5 MG tablet Take 1 tablet (5 mg total) by mouth at bedtime. 04/02/17  Yes Collie SiadStallings, Zoe A, MD  famotidine (PEPCID) 20 MG tablet Take 20 mg by mouth 2 (two) times daily.   Yes [provider]  Multiple Vitamin (MULTIVITAMIN WITH MINERALS) TABS tablet Take 1 tablet by mouth daily.   Yes [provider]  pantoprazole (PROTONIX) 40 MG tablet Take 1 tablet (40 mg total) by  mouth daily. 09/01/17 11/30/17 Yes Sagardia, Eilleen KempfMiguel Jose, MD  promethazine (PHENERGAN) 12.5 MG tablet 1 (ONE) TABLET BY MOUTH EVERY FOUR HOURS, AS NEEDED 12/03/15  Yes [provider]  REXULTI 1 MG TABS Take 1 tablet by mouth daily. 05/11/17  Yes [provider]  SUMAtriptan (IMITREX) 100 MG tablet ! Tab at onset of HA. May repeat in 2 hours if headache persists or recurs.No more than 4 doses a month. 09/01/17  Yes Sagardia, Eilleen KempfMiguel Jose, MD    Family History Family History  Problem Relation Age of Onset  . Diabetes Mother   . Hypertension Mother   . Hyperlipidemia Mother   . Kidney disease Mother   . Cancer Father        pancreatic  . Hypertension Maternal Grandmother   . Diabetes Maternal Grandmother   . Heart failure Maternal Grandmother        CHF  . Heart attack Maternal Grandfather   . Hypertension Maternal Grandfather   . Hypertension Paternal Grandmother   . Alzheimer's disease Paternal Grandmother   . Hypertension Paternal Grandfather   . Cancer Maternal Uncle        melanoma  . Cancer Paternal Uncle        kidney    Social History Social History   Tobacco Use  . Smoking status: Former Smoker    Packs/day: 0.25    Years: 5.00    Pack years: 1.25    Types: Cigarettes    Last attempt to quit: 03/10/2012    Years since quitting: 5.7  . Smokeless tobacco: Never Used  Substance Use Topics  . Alcohol use: Yes    Alcohol/week: 0.0 standard drinks    Comment: 1 a month  . Drug use: No     Allergies   Gabapentin   Review of Systems Review of Systems  Constitutional: Negative for chills, diaphoresis and fever.  Respiratory: Positive for cough and shortness of breath.   Cardiovascular: Negative for palpitations and leg swelling.  Gastrointestinal: Negative for abdominal pain, diarrhea, nausea and vomiting.  Musculoskeletal: Negative for back pain.  All other systems reviewed and are negative.    Physical Exam Updated Vital Signs BP (!) 160/72  (BP Location: Right Arm)   Pulse 93   Temp 98.4 F (36.9 C) (Oral)   Resp (!) 22   LMP  (LMP Unknown)   SpO2 94%   Physical Exam  Constitutional: She appears well-developed and well-nourished. No distress.  HENT:  Head: Normocephalic and atraumatic.  Eyes: Conjunctivae are normal.  Neck: Neck supple.  Cardiovascular: Normal rate, regular rhythm, normal heart sounds and intact distal pulses.  Pulmonary/Chest: Tachypnea noted. She has decreased breath sounds (global,  could be due to body habitus). She has wheezes in the left lower field.  Some tachypnea, but no accessory muscle usage.  Patient speaks in full sentences without noted difficulty. SPO2 94% on 2 L supplemental O2.  Abdominal: Soft. There is no tenderness. There is no guarding.  Musculoskeletal: She exhibits no edema.  Lymphadenopathy:    She has no cervical adenopathy.  Neurological: She is alert.  Skin: Skin is warm and dry. She is not diaphoretic.  Psychiatric: She has a normal mood and affect. Her behavior is normal.  Nursing note and vitals reviewed.    ED Treatments / Results  Labs (all labs ordered are listed, but only abnormal results are displayed) Labs Reviewed  CBC WITH DIFFERENTIAL/PLATELET - Abnormal; Notable for the following components:      Result Value   WBC 11.6 (*)    Neutro Abs 9.5 (*)    All other components within normal limits  BASIC METABOLIC PANEL  BLOOD GAS, VENOUS  HIV ANTIBODY (ROUTINE TESTING W REFLEX)  CBC  BASIC METABOLIC PANEL    EKG EKG Interpretation  Date/Time:  Sunday November 29 2017 17:03:19 EDT Ventricular Rate:  97 PR Interval:    QRS Duration: 93 QT Interval:  377 QTC Calculation: 479 R Axis:   18 Text Interpretation:  Sinus rhythm Low voltage, precordial leads No significant change since last tracing Confirmed by Richardean Canal [phone]) on 11/29/2017 7:52:47 PM  Radiology Dg Chest 2 View  Result Date: 11/29/2017 CLINICAL DATA:  Shortness of breath, cough EXAM:  CHEST - 2 VIEW COMPARISON:  09/25/2014 FINDINGS: Lungs are clear.  No pleural effusion or pneumothorax. The heart is normal in size. Visualized osseous structures are within normal limits. IMPRESSION: Normal chest radiographs. Electronically Signed   By: Charline Bills M.D.   On: 11/29/2017 15:45    Procedures .Critical Care Performed by: Anselm Pancoast, PA-C Authorized by: Anselm Pancoast, PA-C   Critical care provider statement:    Critical care time (minutes):  45   Critical care time was exclusive of:  Separately billable procedures and treating other patients   Critical care was necessary to treat or prevent imminent or life-threatening deterioration of the following conditions:  Respiratory failure   Critical care was time spent personally by me on the following activities:  Development of treatment plan with patient or surrogate, discussions with consultants, evaluation of patient's response to treatment, examination of patient, obtaining history from patient or surrogate, ordering and performing treatments and interventions, ordering and review of laboratory studies, ordering and review of radiographic studies, pulse oximetry and re-evaluation of patient's condition   I assumed direction of critical care for this patient from another provider in my specialty: no     (including critical care time)  Medications Ordered in ED Medications  pantoprazole (PROTONIX) EC tablet 40 mg (has no administration in time range)  enoxaparin (LOVENOX) injection 40 mg (has no administration in time range)  predniSONE (DELTASONE) tablet 40 mg (has no administration in time range)  albuterol (PROVENTIL) (2.5 MG/3ML) 0.083% nebulizer solution 2.5 mg (has no administration in time range)  ipratropium (ATROVENT) nebulizer solution 0.5 mg (has no administration in time range)  albuterol (PROVENTIL) (2.5 MG/3ML) 0.083% nebulizer solution 5 mg (5 mg Nebulization Given 11/29/17 1410)  ipratropium-albuterol  (DUONEB) 0.5-2.5 (3) MG/3ML nebulizer solution 3 mL (3 mLs Nebulization Given 11/29/17 1611)  methylPREDNISolone sodium succinate (SOLU-MEDROL) 125 mg/2 mL injection 125 mg (125 mg Intravenous Given 11/29/17 1633)  albuterol (PROVENTIL,VENTOLIN)  solution continuous neb (10 mg/hr Nebulization Given 11/29/17 1706)  magnesium sulfate IVPB 2 g 50 mL ( Intravenous Stopped 11/29/17 1809)     Initial Impression / Assessment and Plan / ED Course  I have reviewed the triage vital signs and the nursing notes.  Pertinent labs & imaging results that were available during my care of the patient were reviewed by me and considered in my medical decision making (see chart for details).  Clinical Course as of Nov 29 1953  Wynelle Link Nov 29, 2017  1640 Patient states the DuoNeb has improved her breathing "a little bit."  Patient continues to be diminished with wheezes in the bases.   [SJ]  1701 RN reports patient's SPO2 on room air is 89 to 90%.  She speaks in short phrases with dyspnea.   [SJ]  1818 Following hour-long albuterol nebulizer, patient continues to feel short of breath.  Some improvement in the patient's lung sounds, but still diminished.  SPO2 at 92 to 94% on 4 L supplemental O2.   [SJ]  1917 Spoke with Dr. Julian Reil, hospitalist.  Agrees to admit the patient.   [SJ]    Clinical Course User Index [SJ] Joy, Shawn C, PA-C    Patient presents with shortness of breath and cough.  Hypoxic despite multiple breathing treatments.  Low suspicion for PE due to history, onset, and physical exam findings.  Suspect asthma exacerbation. Patient's improvement seem to be temporary, despite multiple different treatment adjuncts.  Admitted for continued management.   Findings and plan of care discussed with Chaney Malling, MD. Dr. Silverio Lay personally evaluated and examined this patient.  Vitals:   11/29/17 1406 11/29/17 1548 11/29/17 1711  BP: (!) 160/72 (!) 146/84   Pulse: 93 92   Resp: (!) 22 (!) 28   Temp: 98.4 F (36.9  C)    TempSrc: Oral    SpO2: 94% 92% 90%     Final Clinical Impressions(s) / ED Diagnoses   Final diagnoses:  Shortness of breath  Acute respiratory failure with hypoxia [facility])    ED Discharge Orders    None       Concepcion Living 11/29/17 1955    Charlynne Pander, MD 11/29/17 [phone]

## 2017-11-29 NOTE — H&P (Signed)
History and Physical    Alyssa Blanchard ZOX:[id number] DOB: 1971/02/09 DOA: 11/29/2017  PCP: [name], MD  Patient coming from: Home  I have personally briefly reviewed patient's old medical records in [facility]  Chief Complaint: SOB  HPI: Alyssa Blanchard is a 47 y.o. female with medical history significant of asthma, morbid obesity, depression.  Patient ran out of meds after lost insurance in June.  Patient presents to ED with severe SOB, wheezing, cough productive of sputum.  Symptoms onset 2 days ago, worsening and persistent.  Not helped by using home albuterol inhaler "at least 8 times a day".   ED Course: Albuterol, mag, solumedrol, etc given.  Some improvement but still has O2 requirement.   Review of Systems: As per HPI otherwise 10 point review of systems negative.   Past Medical History:  Diagnosis Date  . Anxiety   . Asthma   . Depression   . GERD (gastroesophageal reflux disease)   . Heel spur    bilat  . History of bronchitis   . History of chicken pox   . History of urinary tract infection   . Migraines   . Plantar fasciitis    bilat  . STD (sexually transmitted disease)    chl hx & hsv 1&2  . Tremors of nervous system   . Urinary incontinence     Past Surgical History:  Procedure Laterality Date  . LAPAROSCOPIC GASTRIC SLEEVE RESECTION N/A 11/27/2014   Procedure: LAPAROSCOPIC GASTRIC SLEEVE RESECTION WITH HIATAL HERNIA REPAIR UPPER ENDOSCOPY;  Surgeon: Luretha Murphy, MD;  Location: [facility] ORS;  Service: General;  Laterality: N/A;  . TONSILLECTOMY  1978     reports that she quit smoking about 5 years ago. Her smoking use included cigarettes. She has a 1.25 pack-year smoking history. She has never used smokeless tobacco. She reports that she drinks alcohol. She reports that she does not use drugs.  Allergies  Allergen Reactions  . Gabapentin Anaphylaxis    Family History  Problem Relation Age of Onset  . Diabetes Mother   . Hypertension  Mother   . Hyperlipidemia Mother   . Kidney disease Mother   . Cancer Father        pancreatic  . Hypertension Maternal Grandmother   . Diabetes Maternal Grandmother   . Heart failure Maternal Grandmother        CHF  . Heart attack Maternal Grandfather   . Hypertension Maternal Grandfather   . Hypertension Paternal Grandmother   . Alzheimer's disease Paternal Grandmother   . Hypertension Paternal Grandfather   . Cancer Maternal Uncle        melanoma  . Cancer Paternal Uncle        kidney     Prior to Admission medications   Medication Sig Start Date End Date Taking? Authorizing Provider  acetaminophen (TYLENOL) 500 MG tablet Take 1,000 mg by mouth every 6 (six) hours as needed for mild pain.   Yes [provider]  albuterol (PROVENTIL HFA;VENTOLIN HFA) 108 (90 BASE) MCG/ACT inhaler Inhale 2 puffs into the lungs every 6 (six) hours as needed. 07/09/13  Yes Elvina Sidle, MD  ALPRAZolam [name]) 1 MG tablet TAKE 1/2 TO 1 TABLET BY MOUTH EVERY 12 HOURS AS NEEDED (FILLABLE 01/24/16) 01/16/16  Yes [provider]  buPROPion (WELLBUTRIN XL) 300 MG 24 hr tablet Take 300 mg by mouth every morning.  08/09/14  Yes [provider]  buPROPion (WELLBUTRIN) 100 MG tablet Take 200 mg by mouth  2 (two) times daily. 04/14/17  Yes [provider]  cyclobenzaprine (FLEXERIL) 5 MG tablet Take 1 tablet (5 mg total) by mouth at bedtime. 04/02/17  Yes Collie SiadStallings, Zoe A, MD  famotidine (PEPCID) 20 MG tablet Take 20 mg by mouth 2 (two) times daily.   Yes [provider]  Multiple Vitamin (MULTIVITAMIN WITH MINERALS) TABS tablet Take 1 tablet by mouth daily.   Yes [provider]  pantoprazole (PROTONIX) 40 MG tablet Take 1 tablet (40 mg total) by mouth daily. 09/01/17 11/30/17 Yes Sagardia, Eilleen KempfMiguel Jose, MD  promethazine (PHENERGAN) 12.5 MG tablet 1 (ONE) TABLET BY MOUTH EVERY FOUR HOURS, AS NEEDED 12/03/15  Yes [provider]  REXULTI 1 MG TABS Take 1  tablet by mouth daily. 05/11/17  Yes [provider]  SUMAtriptan (IMITREX) 100 MG tablet ! Tab at onset of HA. May repeat in 2 hours if headache persists or recurs.No more than 4 doses a month. 09/01/17  Yes Georgina QuintSagardia, Miguel Jose, MD    Physical Exam: Vitals:   11/29/17 1711 11/29/17 1818 11/29/17 1828 11/29/17 1845  BP:  (!) 119/45  (!) 146/53  Pulse:  (!) 108 (!) 106 (!) 106  Resp:  (!) 26 (!) 35 (!) 32  Temp:      TempSrc:      SpO2: 90% (!) 89% 94% (!) 89%    Constitutional: NAD, calm, comfortable Eyes: PERRL, lids and conjunctivae normal ENMT: Mucous membranes are moist. Posterior pharynx clear of any exudate or lesions.Normal dentition.  Neck: normal, supple, no masses, no thyromegaly Respiratory: Diffuse wheezing Cardiovascular: Regular rate and rhythm, no murmurs / rubs / gallops. No extremity edema. 2+ pedal pulses. No carotid bruits.  Abdomen: no tenderness, no masses palpated. No hepatosplenomegaly. Bowel sounds positive.  Musculoskeletal: no clubbing / cyanosis. No joint deformity upper and lower extremities. Good ROM, no contractures. Normal muscle tone.  Skin: no rashes, lesions, ulcers. No induration Neurologic: CN 2-12 grossly intact. Sensation intact, DTR normal. Strength 5/5 in all 4.  Psychiatric: Normal judgment and insight. Alert and oriented x 3. Normal mood.    Labs on Admission: I have personally reviewed following labs and imaging studies  CBC: Recent Labs  Lab 11/29/17 1635  WBC 11.6*  NEUTROABS 9.5*  HGB 14.4  HCT 44.7  MCV 88.3  PLT 218   Basic Metabolic Panel: Recent Labs  Lab 11/29/17 1635  NA 140  K 4.1  CL 105  CO2 23  GLUCOSE 98  BUN 9  CREATININE 0.97  CALCIUM 8.9   GFR: CrCl cannot be calculated (Unknown ideal weight.). Liver Function Tests: No results for input(s): AST, ALT, ALKPHOS, BILITOT, PROT, ALBUMIN in the last 168 hours. No results for input(s): LIPASE, AMYLASE in the last 168 hours. No results for input(s):  AMMONIA in the last 168 hours. Coagulation Profile: No results for input(s): INR, PROTIME in the last 168 hours. Cardiac Enzymes: No results for input(s): CKTOTAL, CKMB, CKMBINDEX, TROPONINI in the last 168 hours. BNP (last 3 results) No results for input(s): PROBNP in the last 8760 hours. HbA1C: No results for input(s): HGBA1C in the last 72 hours. CBG: No results for input(s): GLUCAP in the last 168 hours. Lipid Profile: No results for input(s): CHOL, HDL, LDLCALC, TRIG, CHOLHDL, LDLDIRECT in the last 72 hours. Thyroid Function Tests: No results for input(s): TSH, T4TOTAL, FREET4, T3FREE, THYROIDAB in the last 72 hours. Anemia Panel: No results for input(s): VITAMINB12, FOLATE, FERRITIN, TIBC, IRON, RETICCTPCT in the last 72 hours. Urine  analysis:    Component Value Date/Time   BILIRUBINUR negative 07/28/2016 1614   BILIRUBINUR neg 08/29/2014 1528   KETONESUR negative 07/28/2016 1614   PROTEINUR negative 07/28/2016 1614   PROTEINUR neg 08/29/2014 1528   UROBILINOGEN 0.2 07/28/2016 1614   NITRITE Negative 07/28/2016 1614   NITRITE neg 08/29/2014 1528   LEUKOCYTESUR Negative 07/28/2016 1614    Radiological Exams on Admission: Dg Chest 2 View  Result Date: 11/29/2017 CLINICAL DATA:  Shortness of breath, cough EXAM: CHEST - 2 VIEW COMPARISON:  09/25/2014 FINDINGS: Lungs are clear.  No pleural effusion or pneumothorax. The heart is normal in size. Visualized osseous structures are within normal limits. IMPRESSION: Normal chest radiographs. Electronically Signed   By: Charline Bills M.D.   On: 11/29/2017 15:45    EKG: Independently reviewed.  Assessment/Plan Principal Problem:   Acute respiratory failure with hypoxia (HCC) Active Problems:   History of asthma   Acute bronchitis   Status asthmaticus    1. Acute resp failure with hypoxia - acute bronchitis and status asthmaticus 1. Adult wheeze protocol 2. COPD pathway 3. Z-pak 4. PRN albuterol 5. Scheduled  atrovent 6. Prednisone PO 7. Cont pulse ox 8. CM consult for med access 9. Repeat BMP in AM - keep eye on glucose with steroid use, no personal H/O DM though 2. GERD - restarting Protonix 3. Depression - will hold off restarting welbutrin for the moment  DVT prophylaxis: Lovenox Code Status: Full Family Communication: No family in room Disposition Plan: Home after admit Consults called: None Admission status: Place in obs   Lauree Yurick, Heywood Iles. DO [facility] Pager [phone] Only works nights!  If 7AM-7PM, please contact the primary day team physician taking care of patient  [email or web address] Password [facility]  11/29/2017, 7:54 PM

## 2017-11-29 NOTE — ED Triage Notes (Signed)
Pt c/o shortness of breath and asthma. Pt reports productive cough with thick yellow sputum. Pt tachypnic in triage but no distress.

## 2017-11-30 LAB — RESPIRATORY PANEL BY PCR

## 2017-11-30 LAB — CBC
HCT: 41.4 % (ref 36.0–46.0)
Hemoglobin: 13.4 g/dL (ref 12.0–15.0)
MCH: 28.6 pg (ref 26.0–34.0)
MCHC: 32.4 g/dL (ref 30.0–36.0)
MCV: 88.3 fL (ref 78.0–100.0)
Platelets: 245 10*3/uL (ref 150–400)
RBC: 4.69 MIL/uL (ref 3.87–5.11)
RDW: 14.7 % (ref 11.5–15.5)
WBC: 10.2 10*3/uL (ref 4.0–10.5)

## 2017-11-30 LAB — BASIC METABOLIC PANEL
Anion gap: 9 (ref 5–15)
BUN: 10 mg/dL (ref 6–20)
CO2: 24 mmol/L (ref 22–32)
Calcium: 9 mg/dL (ref 8.9–10.3)
Chloride: 107 mmol/L (ref 98–111)
Creatinine, Ser: 0.82 mg/dL (ref 0.44–1.00)
GFR calc Af Amer: >60 mL/min (ref >60)
GFR calc non Af Amer: >60 mL/min (ref >60)
Glucose, Bld: 183 mg/dL — ABNORMAL HIGH (ref 70–99)
Potassium: 4.3 mmol/L (ref 3.5–5.1)
Sodium: 140 mmol/L (ref 135–145)

## 2017-11-30 LAB — BLOOD GAS, ARTERIAL
Acid-base deficit: 0 mmol/L (ref 0.0–2.0)
Bicarbonate: 23.6 mmol/L (ref 20.0–28.0)
Drawn by: 51425
O2 Content: 4 L/min
O2 Saturation: 92.8 %
Patient temperature: 98.6
pCO2 arterial: 36.8 mmHg (ref 32.0–48.0)
pH, Arterial: 7.423 (ref 7.350–7.450)
pO2, Arterial: 67.7 mmHg — ABNORMAL LOW (ref 83.0–108.0)

## 2017-11-30 LAB — HIV ANTIBODY (ROUTINE TESTING W REFLEX): HIV Screen 4th Generation wRfx: NONREACTIVE

## 2017-11-30 MED ORDER — ACETAMINOPHEN 325 MG PO TABS
650.0000 mg | ORAL_TABLET | Freq: Four times a day (QID) | ORAL | Status: DC | PRN
  Administered 2017-11-30 – 2017-12-01 (×2): 650 mg via ORAL
  Filled 2017-11-30 (×2): qty 2

## 2017-11-30 MED ORDER — GUAIFENESIN ER 600 MG PO TB12
1200.0000 mg | ORAL_TABLET | Freq: Two times a day (BID) | ORAL | Status: DC
  Administered 2017-11-30 – 2017-12-01 (×3): 1200 mg via ORAL
  Filled 2017-11-30 (×3): qty 2

## 2017-11-30 MED ORDER — METHYLPREDNISOLONE SODIUM SUCC 125 MG IJ SOLR
60.0000 mg | Freq: Two times a day (BID) | INTRAMUSCULAR | Status: DC
  Administered 2017-11-30 – 2017-12-01 (×3): 60 mg via INTRAVENOUS
  Filled 2017-11-30 (×3): qty 2

## 2017-11-30 MED ORDER — SODIUM CHLORIDE 3 % IN NEBU
4.0000 mL | INHALATION_SOLUTION | Freq: Three times a day (TID) | RESPIRATORY_TRACT | Status: DC
  Administered 2017-11-30 – 2017-12-01 (×3): 4 mL via RESPIRATORY_TRACT
  Filled 2017-11-30 (×6): qty 4

## 2017-11-30 NOTE — Progress Notes (Addendum)
PROGRESS NOTE  Alyssa Blanchard WUJ:[id number] DOB: 11/12/1970 DOA: 11/29/2017 PCP: [name], MD  HPI/Recap of past 24 hours:  Alyssa Blanchard is a 47 y.o. female with medical history significant of asthma, morbid obesity, depression.  Patient ran out of meds after lost insurance in June. Patient presents to ED with severe SOB, wheezing, cough productive of sputum.  Symptoms onset 2 days ago, worsening and persistent.  Not helped by using home albuterol inhaler "at least 8 times a day". Admitted for acute hypoxic respiratory failure secondary to asthma exacerbation.  11/30/2017: Patient seen and examined her bedside.  Reports no history of O2 supplementation at home or intubation.  Reports dyspnea with minimal movement and persistent cough.  ABG done this morning revealed persistent hypoxia with a PO2 67 on 4 L O2 by nasal cannula.     Assessment/Plan: Principal Problem:   Acute respiratory failure with hypoxia (HCC) Active Problems:   History of asthma   Acute bronchitis   Status asthmaticus  Acute hypoxic respiratory failure secondary to acute asthma exacerbation Independently reviewed chest x-ray which did not reveal any sign of lobular infiltrates Viral panel ordered this morning and pending Continue round-the-clock breathing treatment with DuoNeb every 6 hours and every 2 hours as needed Patient is still hypoxic: ABG done on 11/30/2017 revealed PO2 67 on 4 L of oxygen by nasal cannula-not on O2 supplementation at home Start pulmonary toilet with Mucinex 1200 mg twice daily, hypersaline nebs 3 times daily Start IV Solu-Medrol 60 mg twice daily Continue to monitor O2 saturation Out of bed to chair as tolerated PT to assess Home O2 evaluation prior to discharge Obtain procalcitonin to rule out bacterial pulmonary infection  Accelerated hypertension Not on antihypertensive medications at home IV hydralazine as needed for systolic blood pressure greater than 180 or diastolic  blood pressure greater than 105 Continue to monitor vital signs  GERD Continue Protonix  Depression Resume Wellbutrin  Risks: High risk for decompensation due to persistent hypoxia in the setting of acute asthma exacerbation.  Patient requires round-the-clock breathing treatments and IV steroids in order to improve present condition.  Patient will require at least 2 midnights.   Code Status: Full code  Family Communication: None at bedside  Disposition Plan: Home in 2 to 3 days until clinically stable   Consultants:  None  Procedures:  None  Antimicrobials:  None  DVT prophylaxis: Lovenox subcu daily   Objective: Vitals:   11/30/17 0444 11/30/17 0756 11/30/17 1350 11/30/17 1352  BP: 128/68  (!) 153/67   Pulse: 76  83   Resp: (!) 24  16   Temp: 98.4 F (36.9 C)  98.3 F (36.8 C)   TempSrc:   Oral   SpO2: 95% 97% 96% 95%  Weight:      Height:        Intake/Output Summary (Last 24 hours) at 11/30/2017 1459 Last data filed at 11/30/2017 1234 Gross per 24 hour  Intake 42.53 ml  Output 3400 ml  Net -3357.47 ml   Filed Weights   11/29/17 2144  Weight: (!) 148.9 kg    Exam:  . General: 47 y.o. year-old female well developed well nourished appears uncomfortable due to conversational dyspnea.  Alert and oriented x3. . Cardiovascular: Regular rate and rhythm with no rubs or gallops.  No thyromegaly or JVD noted. Marland Kitchen Respiratory: Diffuse wheezes bilaterally. Good inspiratory effort. . Abdomen: Soft nontender nondistended with normal bowel sounds x4 quadrants. . Musculoskeletal: No lower extremity edema. 2/4  pulses in all 4 extremities. . Skin: No ulcerative lesions noted or rashes, . Psychiatry: Mood is appropriate for condition and setting   Data Reviewed: CBC: Recent Labs  Lab 11/29/17 1635 11/30/17 0550  WBC 11.6* 10.2  NEUTROABS 9.5*  --   HGB 14.4 13.4  HCT 44.7 41.4  MCV 88.3 88.3  PLT 218 245   Basic Metabolic Panel: Recent Labs  Lab  11/29/17 1635 11/30/17 0550  NA 140 140  K 4.1 4.3  CL 105 107  CO2 23 24  GLUCOSE 98 183*  BUN 9 10  CREATININE 0.97 0.82  CALCIUM 8.9 9.0   GFR: Estimated Creatinine Clearance: 127.3 mL/min (by C-G formula based on SCr of 0.82 mg/dL). Liver Function Tests: No results for input(s): AST, ALT, ALKPHOS, BILITOT, PROT, ALBUMIN in the last 168 hours. No results for input(s): LIPASE, AMYLASE in the last 168 hours. No results for input(s): AMMONIA in the last 168 hours. Coagulation Profile: No results for input(s): INR, PROTIME in the last 168 hours. Cardiac Enzymes: No results for input(s): CKTOTAL, CKMB, CKMBINDEX, TROPONINI in the last 168 hours. BNP (last 3 results) No results for input(s): PROBNP in the last 8760 hours. HbA1C: No results for input(s): HGBA1C in the last 72 hours. CBG: No results for input(s): GLUCAP in the last 168 hours. Lipid Profile: No results for input(s): CHOL, HDL, LDLCALC, TRIG, CHOLHDL, LDLDIRECT in the last 72 hours. Thyroid Function Tests: No results for input(s): TSH, T4TOTAL, FREET4, T3FREE, THYROIDAB in the last 72 hours. Anemia Panel: No results for input(s): VITAMINB12, FOLATE, FERRITIN, TIBC, IRON, RETICCTPCT in the last 72 hours. Urine analysis:    Component Value Date/Time   BILIRUBINUR negative 07/28/2016 1614   BILIRUBINUR neg 08/29/2014 1528   KETONESUR negative 07/28/2016 1614   PROTEINUR negative 07/28/2016 1614   PROTEINUR neg 08/29/2014 1528   UROBILINOGEN 0.2 07/28/2016 1614   NITRITE Negative 07/28/2016 1614   NITRITE neg 08/29/2014 1528   LEUKOCYTESUR Negative 07/28/2016 1614   Sepsis Labs: @LABRCNTIP (procalcitonin:4,lacticidven:4)  ) Recent Results (from the past 240 hour(s))  Respiratory Panel by PCR     Status: Abnormal   Collection Time: 11/30/17  9:31 AM  Result Value Ref Range Status   Adenovirus NOT DETECTED NOT DETECTED Final   Coronavirus 229E NOT DETECTED NOT DETECTED Final   Coronavirus HKU1 NOT DETECTED  NOT DETECTED Final   Coronavirus NL63 NOT DETECTED NOT DETECTED Final   Coronavirus OC43 NOT DETECTED NOT DETECTED Final   Metapneumovirus NOT DETECTED NOT DETECTED Final   Rhinovirus / Enterovirus DETECTED (A) NOT DETECTED Final   Influenza A NOT DETECTED NOT DETECTED Final   Influenza B NOT DETECTED NOT DETECTED Final   Parainfluenza Virus 1 NOT DETECTED NOT DETECTED Final   Parainfluenza Virus 2 NOT DETECTED NOT DETECTED Final   Parainfluenza Virus 3 NOT DETECTED NOT DETECTED Final   Parainfluenza Virus 4 NOT DETECTED NOT DETECTED Final   Respiratory Syncytial Virus NOT DETECTED NOT DETECTED Final   Bordetella pertussis NOT DETECTED NOT DETECTED Final   Chlamydophila pneumoniae NOT DETECTED NOT DETECTED Final   Mycoplasma pneumoniae NOT DETECTED NOT DETECTED Final    Comment: Performed at [facility], [address], [place], [place] [address]      Studies: Dg Chest 2 View  Result Date: 11/29/2017 CLINICAL DATA:  Shortness of breath, cough EXAM: CHEST - 2 VIEW COMPARISON:  09/25/2014 FINDINGS: Lungs are clear.  No pleural effusion or pneumothorax. The heart is normal in size. Visualized  osseous structures are within normal limits. IMPRESSION: Normal chest radiographs. Electronically Signed   By: Charline BillsSriyesh  Krishnan M.D.   On: 11/29/2017 15:45    Scheduled Meds: . azithromycin  250 mg Oral Daily  . enoxaparin (LOVENOX) injection  40 mg Subcutaneous Q24H  . guaiFENesin  1,200 mg Oral BID  . ipratropium-albuterol  3 mL Nebulization Q6H  . methylPREDNISolone (SOLU-MEDROL) injection  60 mg Intravenous Q12H  . pantoprazole  40 mg Oral Daily  . sodium chloride HYPERTONIC  4 mL Nebulization TID    Continuous Infusions:   LOS: 0 days     Darlin Droparole N Serra Younan, MD [facility] Pager [phone]  If 7PM-7AM, please contact night-coverage [email or web address] Password [email or web address] 11/30/2017, 2:59 PM

## 2017-12-01 DIAGNOSIS — J9601 Acute respiratory failure with hypoxia: Principal | ICD-10-CM

## 2017-12-01 DIAGNOSIS — J45902 Unspecified asthma with status asthmaticus: Secondary | ICD-10-CM

## 2017-12-01 DIAGNOSIS — R0602 Shortness of breath: Secondary | ICD-10-CM

## 2017-12-01 DIAGNOSIS — Z8709 Personal history of other diseases of the respiratory system: Secondary | ICD-10-CM

## 2017-12-01 LAB — BASIC METABOLIC PANEL
Anion gap: 11 (ref 5–15)
BUN: 12 mg/dL (ref 6–20)
CO2: 22 mmol/L (ref 22–32)
Calcium: 8.6 mg/dL — ABNORMAL LOW (ref 8.9–10.3)
Chloride: 107 mmol/L (ref 98–111)
Creatinine, Ser: 0.77 mg/dL (ref 0.44–1.00)
GFR calc Af Amer: >60 mL/min (ref >60)
GFR calc non Af Amer: >60 mL/min (ref >60)
Glucose, Bld: 186 mg/dL — ABNORMAL HIGH (ref 70–99)
Potassium: 4.1 mmol/L (ref 3.5–5.1)
Sodium: 140 mmol/L (ref 135–145)

## 2017-12-01 LAB — CBC
HCT: 40.8 % (ref 36.0–46.0)
Hemoglobin: 12.8 g/dL (ref 12.0–15.0)
MCH: 28.3 pg (ref 26.0–34.0)
MCHC: 31.4 g/dL (ref 30.0–36.0)
MCV: 90.3 fL (ref 78.0–100.0)
Platelets: 249 10*3/uL (ref 150–400)
RBC: 4.52 MIL/uL (ref 3.87–5.11)
RDW: 14.6 % (ref 11.5–15.5)
WBC: 16.4 10*3/uL — ABNORMAL HIGH (ref 4.0–10.5)

## 2017-12-01 LAB — LACTIC ACID, PLASMA: Lactic Acid, Venous: 2.4 mmol/L (ref 0.5–1.9)

## 2017-12-01 LAB — PROCALCITONIN: Procalcitonin: <0.1 ng/mL

## 2017-12-01 LAB — PHOSPHORUS: Phosphorus: 2.7 mg/dL (ref 2.5–4.6)

## 2017-12-01 LAB — MAGNESIUM: Magnesium: 2.1 mg/dL (ref 1.7–2.4)

## 2017-12-01 MED ORDER — AZITHROMYCIN 250 MG PO TABS: ORAL_TABLET | ORAL | 0 refills | Status: DC

## 2017-12-01 MED ORDER — FLUCONAZOLE 100 MG PO TABS: 100.0000 mg | ORAL_TABLET | Freq: Every day | ORAL | 0 refills | Status: AC

## 2017-12-01 MED ORDER — IPRATROPIUM-ALBUTEROL 0.5-2.5 (3) MG/3ML IN SOLN: 3.0000 mL | Freq: Four times a day (QID) | RESPIRATORY_TRACT | 0 refills | Status: DC

## 2017-12-01 MED ORDER — PREDNISONE 10 MG PO TABS: 10.0000 mg | ORAL_TABLET | Freq: Every day | ORAL | 0 refills | Status: AC

## 2017-12-01 MED ORDER — ENOXAPARIN SODIUM 60 MG/0.6ML ~~LOC~~ SOLN: 60.0000 mg | SUBCUTANEOUS | Status: DC

## 2017-12-01 MED ORDER — PREDNISONE 10 MG PO TABS: 10.0000 mg | ORAL_TABLET | Freq: Every day | ORAL | 0 refills | Status: DC

## 2017-12-01 NOTE — Progress Notes (Signed)
SATURATION QUALIFICATIONS: (This note is used to comply with regulatory documentation for home oxygen)  Patient Saturations on Room Air at Rest = 96%  Patient Saturations on Room Air while Ambulating = 93%  Patient Saturations on 0Liters of oxygen while Ambulating = %  Please briefly explain why patient needs home oxygen: No need for oxygen

## 2017-12-01 NOTE — Care Management Note (Signed)
Case Management Note  Patient Details  Name: Alyssa Blanchard MRN: [id number] Date of Birth: 06-15-1970  Subjective/Objective:                  Discharged with dme-nebulizer  Action/Plan: [facility] for [facility]bul[facility]  Expected Discharge Date:  12/01/17               Expected Discharge Plan:  Home w Home Health Services  In-House Referral:     Discharge planning Services  CM Consult  Post Acute Care Choice:  Durable Medical Equipment Choice offered to:  Patient  DME Arranged:  Nebulizer machine DME Agency:  [facility]  [facility]     Status of Service:  Completed, signed off  If discussed at [employer] Length of Stay Meetings, dates discussed:    Additional Comments:  Golda AcreDavis, Rhonda Lynn, RN 12/01/2017, 1:44 PM

## 2017-12-01 NOTE — Discharge Summary (Signed)
Discharge Summary  Alyssa Blanchard ZOX:[id number] DOB: [birth year]  PCP: Georgina QuintSagardia, Miguel Jose, MD  Admit date: 11/29/2017 Discharge date: 12/01/2017  Time spent: 25 minutes  Recommendations for Outpatient Follow-up:  1. Follow-up with your PCP 2. Take your medications as prescribed  Discharge Diagnoses:  Active Hospital Problems   Diagnosis Date Noted  . Acute respiratory failure with hypoxia (HCC) 11/29/2017  . Status asthmaticus 11/29/2017  . Acute bronchitis 04/17/2016  . History of asthma 04/17/2016    Resolved Hospital Problems  No resolved problems to display.    Discharge Condition: Stable  Diet recommendation: Resume previous diet  Vitals:   12/01/17 0448 12/01/17 0844  BP: (!) 114/59   Pulse: 79   Resp: 19   Temp: 98.2 F (36.8 C)   SpO2: 97% 94%    History of present illness:  Chanel Spottsis a 47 y.o.femalewith medical history significant ofasthma, morbid obesity, depression. Patient ran out of meds after lost insurance in June. Patient presents to ED with severe SOB, wheezing, cough productive of sputum. Symptoms onset 2 days ago, worsening and persistent. Not helped by using home albuterol inhaler "at least 8 times a day". Admitted for acute hypoxic respiratory failure secondary to asthma exacerbation.  11/30/2017: Reports no history of O2 supplementation at home, intubation or current tobacco use/exposure.  Reports dyspnea with minimal movement and persistent cough.  ABG done this morning revealed persistent hypoxia with a PO2 67 on 4 L O2 by nasal cannula.  Started on aggressive pulmonary toilet and around-the-clock nebs treatment.  12/01/2017: Patient seen and examined at bedside.  No acute events overnight.  Patient states she feels better.  Breathing is improved.  Passed home O2 evaluation and does not require oxygen supplementation upon discharge.  On the day of discharge the patient was hemodynamically stable.  She will need to follow-up with her PCP  posthospitalization. She would like to go home.    Hospital Course:  Principal Problem:   Acute respiratory failure with hypoxia (HCC) Active Problems:   History of asthma   Acute bronchitis   Status asthmaticus  Resolved acute hypoxic respiratory failure secondary to acute asthma exacerbation Independently reviewed chest x-ray which did not reveal any sign of lobular infiltrates Viral respiratory panel positive for rhinovirus. Completed round-the-clock breathing treatment with DuoNebs every 6 hours and every 2 hours as needed Completed pulmonary toilet with Mucinex 1200 mg twice daily, hypersaline nebs 3 times daily Completed IV Solu-Medrol 60 mg twice daily  Resolved accelerated hypertension Not on antihypertensive medications at home IV hydralazine as needed for systolic blood pressure greater than 180 or diastolic blood pressure greater than 105 Continue to monitor vital signs  GERD Continue Protonix  Depression Continue Wellbutrin   Code Status: Full code   Consultants:  None  Procedures:  None  Antimicrobials:  None  DVT prophylaxis: Lovenox subcu daily    Discharge Exam: BP (!) 114/59 Comment: nurse notified  Pulse 79   Temp 98.2 F (36.8 C)   Resp 19   Ht 5\' 6"  (1.676 m)   Wt (!) 148.9 kg   LMP  (LMP Unknown)   SpO2 94%   BMI 52.98 kg/m  . General: 47 y.o. year-old female well developed well nourished in no acute distress.  Alert and oriented x3. . Cardiovascular: Regular rate and rhythm with no rubs or gallops.  No thyromegaly or JVD noted.   Marland Kitchen. Respiratory: Clear to auscultation with no wheezes or rales. Good inspiratory effort. . Abdomen: Soft nontender nondistended with  normal bowel sounds x4 quadrants. . Musculoskeletal: No lower extremity edema. 2/4 pulses in all 4 extremities. . Skin: No ulcerative lesions noted or rashes, . Psychiatry: Mood is appropriate for condition and setting  Discharge Instructions You were cared  for by a hospitalist during your hospital stay. If you have any questions about your discharge medications or the care you received while you were in the hospital after you are discharged, you can call the unit and asked to speak with the hospitalist on call if the hospitalist that took care of you is not available. Once you are discharged, your primary care physician will handle any further medical issues. Please note that NO REFILLS for any discharge medications will be authorized once you are discharged, as it is imperative that you return to your primary care physician (or establish a relationship with a primary care physician if you do not have one) for your aftercare needs so that they can reassess your need for medications and monitor your lab values.  Discharge Instructions    DME Nebulizer machine   Complete by:  As directed    Patient needs a nebulizer to treat with the following condition:  Asthma exacerbation     Allergies as of 12/01/2017      Reactions   Gabapentin Anaphylaxis      Medication List    TAKE these medications   acetaminophen 500 MG tablet Commonly known as:  TYLENOL Take 1,000 mg by mouth every 6 (six) hours as needed for mild pain.   albuterol 108 (90 Base) MCG/ACT inhaler Commonly known as:  PROVENTIL HFA;VENTOLIN HFA Inhale 2 puffs into the lungs every 6 (six) hours as needed.   ALPRAZolam 1 MG tablet Commonly known as:  XANAX TAKE 1/2 TO 1 TABLET BY MOUTH EVERY 12 HOURS AS NEEDED (FILLABLE 01/24/16)   azithromycin 250 MG tablet Commonly known as:  ZITHROMAX Take 1 tablet once a day x 5 days. Start taking on:  12/02/2017   buPROPion 300 MG 24 hr tablet Commonly known as:  WELLBUTRIN XL Take 300 mg by mouth every morning. What changed:  Another medication with the same name was removed. Continue taking this medication, and follow the directions you see here.   cyclobenzaprine 5 MG tablet Commonly known as:  FLEXERIL Take 1 tablet (5 mg total) by  mouth at bedtime.   famotidine 20 MG tablet Commonly known as:  PEPCID Take 20 mg by mouth 2 (two) times daily.   fluconazole 100 MG tablet Commonly known as:  DIFLUCAN Take 1 tablet (100 mg total) by mouth daily for 3 days.   ipratropium-albuterol 0.5-2.5 (3) MG/3ML Soln Commonly known as:  DUONEB Take 3 mLs by nebulization every 6 (six) hours.   multivitamin with minerals Tabs tablet Take 1 tablet by mouth daily.   pantoprazole 40 MG tablet Commonly known as:  PROTONIX Take 1 tablet (40 mg total) by mouth daily.   predniSONE 10 MG tablet Commonly known as:  DELTASONE Take 1 tablet (10 mg total) by mouth daily for 5 days.   promethazine 12.5 MG tablet Commonly known as:  PHENERGAN 1 (ONE) TABLET BY MOUTH EVERY FOUR HOURS, AS NEEDED   REXULTI 1 MG Tabs Generic drug:  Brexpiprazole Take 1 tablet by mouth daily.   SUMAtriptan 100 MG tablet Commonly known as:  IMITREX ! Tab at onset of HA. May repeat in 2 hours if headache persists or recurs.No more than 4 doses a month.  Durable Medical Equipment  (From admission, onward)         Start     Ordered   12/01/17 0000  DME Nebulizer machine    Question:  Patient needs a nebulizer to treat with the following condition  Answer:  Asthma exacerbation   12/01/17 1329         Allergies  Allergen Reactions  . Gabapentin Anaphylaxis   Follow-up Information    [name], MD. Call in 1 day(s).   Specialty:  Internal Medicine Why:  Call for a post hospital follow-up appointment. Contact information: [address] [place] [address] [phone]            The results of significant diagnostics from this hospitalization (including imaging, microbiology, ancillary and laboratory) are listed below for reference.    Significant Diagnostic Studies: Dg Chest 2 View  Result Date: 11/29/2017 CLINICAL DATA:  Shortness of breath, cough EXAM: CHEST - 2 VIEW COMPARISON:  09/25/2014 FINDINGS:  Lungs are clear.  No pleural effusion or pneumothorax. The heart is normal in size. Visualized osseous structures are within normal limits. IMPRESSION: Normal chest radiographs. Electronically Signed   By: Charline Bills M.D.   On: 11/29/2017 15:45    Microbiology: Recent Results (from the past 240 hour(s))  Respiratory Panel by PCR     Status: Abnormal   Collection Time: 11/30/17  9:31 AM  Result Value Ref Range Status   Adenovirus NOT DETECTED NOT DETECTED Final   Coronavirus 229E NOT DETECTED NOT DETECTED Final   Coronavirus HKU1 NOT DETECTED NOT DETECTED Final   Coronavirus NL63 NOT DETECTED NOT DETECTED Final   Coronavirus OC43 NOT DETECTED NOT DETECTED Final   Metapneumovirus NOT DETECTED NOT DETECTED Final   Rhinovirus / Enterovirus DETECTED (A) NOT DETECTED Final   Influenza A NOT DETECTED NOT DETECTED Final   Influenza B NOT DETECTED NOT DETECTED Final   Parainfluenza Virus 1 NOT DETECTED NOT DETECTED Final   Parainfluenza Virus 2 NOT DETECTED NOT DETECTED Final   Parainfluenza Virus 3 NOT DETECTED NOT DETECTED Final   Parainfluenza Virus 4 NOT DETECTED NOT DETECTED Final   Respiratory Syncytial Virus NOT DETECTED NOT DETECTED Final   Bordetella pertussis NOT DETECTED NOT DETECTED Final   Chlamydophila pneumoniae NOT DETECTED NOT DETECTED Final   Mycoplasma pneumoniae NOT DETECTED NOT DETECTED Final    Comment: Performed at [facility], [address], [place], [place] [address]     Labs: Basic Metabolic Panel: Recent Labs  Lab 11/29/17 1635 11/30/17 0550 12/01/17 0538  NA 140 140 140  K 4.1 4.3 4.1  CL 105 107 107  CO2 23 24 22   GLUCOSE 98 183* 186*  BUN 9 10 12   CREATININE 0.97 0.82 0.77  CALCIUM 8.9 9.0 8.6*  MG  --   --  2.1  PHOS  --   --  2.7   Liver Function Tests: No results for input(s): AST, ALT, ALKPHOS, BILITOT, PROT, ALBUMIN in the last 168 hours. No results for input(s): LIPASE, AMYLASE in the last 168 hours. No results for input(s):  AMMONIA in the last 168 hours. CBC: Recent Labs  Lab 11/29/17 1635 11/30/17 0550 12/01/17 0538  WBC 11.6* 10.2 16.4*  NEUTROABS 9.5*  --   --   HGB 14.4 13.4 12.8  HCT 44.7 41.4 40.8  MCV 88.3 88.3 90.3  PLT 218 245 249   Cardiac Enzymes: No results for input(s): CKTOTAL, CKMB, CKMBINDEX, TROPONINI in the last 168 hours. BNP:  BNP (last 3 results) No results for input(s): BNP in the last 8760 hours.  ProBNP (last 3 results) No results for input(s): PROBNP in the last 8760 hours.  CBG: No results for input(s): GLUCAP in the last 168 hours.     Signed:  Darlin Drop, MD [facility] 12/01/2017, 1:31 PM

## 2017-12-01 NOTE — Progress Notes (Signed)
CRITICAL VALUE ALERT  Critical Value:  Lactic- 2.4  Date & Time Notied:  12/01/17   Provider Notified: yes   Orders Received/Actions taken: pending

## 2017-12-01 NOTE — Discharge Instructions (Signed)
Shortness of Breath, Adult °Shortness of breath means you have trouble breathing. Your lungs are organs for breathing. °Follow these instructions at home: °Pay attention to any changes in your symptoms. Take these actions to help with your condition: °· Do not smoke. Smoking can cause shortness of breath. If you need help to quit smoking, ask your doctor. °· Avoid things that can make it harder to breathe, such as: °? Mold. °? Dust. °? Air pollution. °? Chemical smells. °? Things that can cause allergy symptoms (allergens), if you have allergies. °· Keep your living space clean and free of mold and dust. °· Rest as needed. Slowly return to your usual activities. °· Take over-the-counter and prescription medicines, including oxygen and inhaled medicines, only as told by your doctor. °· Keep all follow-up visits as told by your doctor. This is important. ° °Contact a doctor if: °· Your condition does not get better as soon as expected. °· You have a hard time doing your normal activities, even after you rest. °· You have new symptoms. °Get help right away if: °· You have trouble breathing when you are resting. °· You feel light-headed or you faint. °· You have a cough that is not helped by medicines. °· You cough up blood. °· You have pain with breathing. °· You have pain in your chest, arms, shoulders, or belly (abdomen). °· You have a fever. °· You cannot walk up stairs. °· You cannot exercise the way you normally do. °This information is not intended to replace advice given to you by your health care provider. Make sure you discuss any questions you have with your health care provider. °Document Released: 08/13/2007 Document Revised: 03/13/2016 Document Reviewed: 03/13/2016 °Elsevier Interactive Patient Education © 2017 Elsevier Inc. ° ° °Pursed Lip Breathing °Pursed lip breathing is a technique to relieve the feeling of being short of breath. Some long-term respiratory conditions, like chronic obstructive  pulmonary disease (COPD) and severe asthma, can make it hard to breathe out (exhale) all of the air in your lungs. This can make air that has less oxygen than normal build up in your lungs (air trapping). Trapped air means your lungs fill with less fresh air when you breathe in (inhale). As a result, you feel short of breath. °Pursed lip breathing keeps your airways open longer when you exhale and empties more air from your lungs. This makes more space for fresh air when you inhale. Pursed lip breathing can also slow down your breathing and help your body not have to work so hard to breathe. Over time, pursed lip breathing may help you be able to be more physically active and do more activities. °How to perform pursed lip breathing °Being short of breath can make you tense and anxious. Before you start this breathing exercise, take a minute to relax your shoulders and close your eyes. Then: °1. Start the exercise by closing your mouth. °2. Breathe in through your nose, taking a normal breath. You can do this at your normal rate of breathing. If you feel you are not getting enough air, breathe in while slowly counting to 2 or 3. °3. Pucker (purse) your lips as if you were going to whistle. °4. Gently tighten your abdomen muscles or press on your belly to help push the air out. °5. Breathe out slowly through your pursed lips. Take at least twice as long to breathe out as it takes you to breathe in. °6. Make sure that you breathe out all of   the air, but do not force air out. °7. Repeat the exercise until your breathing improves. Ask your health care provider how often and how long to do this exercise. ° °Follow these instructions at home: °· Take over-the-counter and prescription medicines only as told by your health care provider. °· Return to your normal activities as told by your health care provider. Ask your health care provider what activities are safe for you. °· Do not use any products that contain nicotine or  tobacco, such as cigarettes and e-cigarettes. If you need help quitting, ask your health care provider. °· Keep all follow-up visits as told by your health care provider. This is important. °Contact a health care provider if: °· Your shortness of breath gets worse. °· You become less able to exercise or be active. °· You develop a cough. °· You develop a fever. °Get help right away if: °· You are struggling to breathe. °· Your shortness of breath prevents you from engaging in any activity. °Summary °· Pursed lip breathing is a breathing technique that helps to remove trapped air from your lungs. It helps you get more oxygen into your lungs and makes your body have to work less hard to breathe. °· Pursed lip breathing can gradually make you more able to be physically active. °· You can do pursed lip breathing on your own at home. °· Ask your health care provider how often and how long you should do pursed lip breathing. °This information is not intended to replace advice given to you by your health care provider. Make sure you discuss any questions you have with your health care provider. °Document Released: 12/04/2007 Document Revised: 01/17/2016 Document Reviewed: 01/17/2016 °Elsevier Interactive Patient Education © 2017 Elsevier Inc. ° °

## 2017-12-23 ENCOUNTER — Ambulatory Visit: Payer: Self-pay | Admitting: Family Medicine

## 2017-12-23 ENCOUNTER — Telehealth: Payer: Self-pay | Admitting: Emergency Medicine

## 2017-12-23 ENCOUNTER — Encounter: Payer: Self-pay | Admitting: Family Medicine

## 2017-12-23 ENCOUNTER — Other Ambulatory Visit: Payer: Self-pay

## 2017-12-23 VITALS — BP 132/84 | HR 85 | Temp 98.5°F | Resp 17 | Ht 66.0 in | Wt 337.6 lb

## 2017-12-23 DIAGNOSIS — R0602 Shortness of breath: Secondary | ICD-10-CM

## 2017-12-23 DIAGNOSIS — J4521 Mild intermittent asthma with (acute) exacerbation: Secondary | ICD-10-CM

## 2017-12-23 DIAGNOSIS — J45901 Unspecified asthma with (acute) exacerbation: Secondary | ICD-10-CM

## 2017-12-23 DIAGNOSIS — R062 Wheezing: Secondary | ICD-10-CM

## 2017-12-23 LAB — POCT CBC
Granulocyte percent: 82 %G — AB (ref 37–80)
HCT, POC: 39.1 % (ref 37.7–47.9)
Hemoglobin: 13.4 g/dL (ref 12.2–16.2)
Lymph, poc: 1 (ref 0.6–3.4)
MCH, POC: 28.7 pg (ref 27–31.2)
MCHC: 34.2 g/dL (ref 31.8–35.4)
MCV: 83.9 fL (ref 80–97)
MID (cbc): 0.3 (ref 0–0.9)
MPV: 8.4 fL (ref 0–99.8)
POC Granulocyte: 5.9 (ref 2–6.9)
POC LYMPH PERCENT: 13.4 %L (ref 10–50)
POC MID %: 4.6 %M (ref 0–12)
Platelet Count, POC: 250 10*3/uL (ref 142–424)
RBC: 4.66 M/uL (ref 4.04–5.48)
RDW, POC: 16 %
WBC: 7.2 10*3/uL (ref 4.6–10.2)

## 2017-12-23 MED ORDER — PREDNISONE 20 MG PO TABS: ORAL_TABLET | ORAL | 0 refills | Status: DC

## 2017-12-23 MED ORDER — FLUCONAZOLE 150 MG PO TABS: 150.0000 mg | ORAL_TABLET | Freq: Once | ORAL | 0 refills | Status: AC

## 2017-12-23 MED ORDER — BENZONATATE 100 MG PO CAPS: 100.0000 mg | ORAL_CAPSULE | Freq: Two times a day (BID) | ORAL | 0 refills | Status: DC | PRN

## 2017-12-23 MED ORDER — IPRATROPIUM BROMIDE 0.02 % IN SOLN
0.5000 mg | Freq: Once | RESPIRATORY_TRACT | Status: AC
  Administered 2017-12-23: 0.5 mg via RESPIRATORY_TRACT

## 2017-12-23 MED ORDER — ALBUTEROL SULFATE (2.5 MG/3ML) 0.083% IN NEBU
2.5000 mg | INHALATION_SOLUTION | Freq: Once | RESPIRATORY_TRACT | Status: AC
  Administered 2017-12-23: 2.5 mg via RESPIRATORY_TRACT

## 2017-12-23 MED ORDER — PREDNISONE 10 MG (21) PO TBPK: ORAL_TABLET | ORAL | 0 refills | Status: DC

## 2017-12-23 MED ORDER — ALBUTEROL SULFATE HFA 108 (90 BASE) MCG/ACT IN AERS: 2.0000 | INHALATION_SPRAY | Freq: Four times a day (QID) | RESPIRATORY_TRACT | 1 refills | Status: DC | PRN

## 2017-12-23 MED ORDER — DOXYCYCLINE HYCLATE 100 MG PO TABS: 100.0000 mg | ORAL_TABLET | Freq: Two times a day (BID) | ORAL | 0 refills | Status: DC

## 2017-12-23 NOTE — Telephone Encounter (Signed)
Copied from CRM [id number]. Topic: Quick Communication - See Telephone Encounter >> Dec 23, 2017  1:16 PM Burchel, Abbi R wrote: CRM for notification. See Telephone encounter for: 12/23/17.  Pt states she spoke with the pharmacy and was informed they cannot fill her rx for Prednisone bc the directions were unclear.  Please contact pt's pharmacy to clarify.  Pt also states her pharmacy is still waiting for a rx for Tessalon Pearls.  Please advise.

## 2017-12-23 NOTE — Telephone Encounter (Signed)
Pt called re questions on prednisone.  Spoke to pharmacy.  Clarified with Dr. Creta Levin who will resend prescription with updated instructions as well as Rx for Tessalon pearls.

## 2017-12-23 NOTE — Telephone Encounter (Signed)
Copied from CRM [id number]. Topic: Quick Communication - See Telephone Encounter >> Dec 23, 2017 12:16 PM Herby Abraham C wrote: CRM for notification. See Telephone encounter for: 12/23/17.  Pt came in to the office to be seen for a cough today. Pt says  that she noticed that the provider didn't provide a Rx for her cough. Pt would like further assistance with this.  CB: [phone]   Pharmacy: [id number] - [place], [place] - [address]

## 2017-12-23 NOTE — Addendum Note (Signed)
Addended by: Collie Siad A on: 12/23/2017 02:18 PM   Modules accepted: Orders

## 2017-12-23 NOTE — Patient Instructions (Addendum)
   If you have lab work done today you will be contacted with your lab results within the next 2 weeks.  If you have not heard from us then please contact us. The fastest way to get your results is to register for My Chart.   IF you received an x-ray today, you will receive an invoice from [facility]. Please contact [facility] at [phone] with questions or concerns regarding your invoice.   IF you received labwork today, you will receive an invoice from [facility]. Please contact [facility] at [phone] with questions or concerns regarding your invoice.   Our billing staff will not be able to assist you with questions regarding bills from these companies.  You will be contacted with the lab results as soon as they are available. The fastest way to get your results is to activate your My Chart account. Instructions are located on the last page of this paperwork. If you have not heard from us regarding the results in 2 weeks, please contact this office.     Bronchospasm, Adult Bronchospasm is a tightening of the airways going into the lungs. During an episode, it may be harder to breathe. You may cough, and you may make a whistling sound when you breathe (wheeze). This condition often affects people with asthma. What are the causes? This condition is caused by swelling and irritation in the airways. It can be triggered by:  An infection (common).  Seasonal allergies.  An allergic reaction.  Exercise.  Irritants. These include pollution, cigarette smoke, strong odors, aerosol sprays, and paint fumes.  Weather changes. Winds increase molds and pollens in the air. Cold air may cause swelling.  Stress and emotional upset.  What are the signs or symptoms? Symptoms of this condition include:  Wheezing. If the episode was triggered by an allergy, wheezing may start right away or hours later.  Nighttime coughing.  Frequent or severe coughing with a simple  cold.  Chest tightness.  Shortness of breath.  Decreased ability to exercise.  How is this diagnosed? This condition is usually diagnosed with a review of your medical history and a physical exam. Tests, such as lung function tests, are sometimes done to look for other conditions. The need for a chest X-ray depends on where the wheezing occurs and whether it is the first time you have wheezed. How is this treated? This condition may be treated with:  Inhaled medicines. These open up the airways and help you breathe. They can be taken with an inhaler or a nebulizer device.  Corticosteroid medicines. These may be given for severe bronchospasm, usually when it is associated with asthma.  Avoiding triggers, such as irritants, infection, or allergies.  Follow these instructions at home: Medicines  Take over-the-counter and prescription medicines only as told by your health care provider.  If you need to use an inhaler or nebulizer to take your medicine, ask your health care provider to explain how to use it correctly. If you were given a spacer, always use it with your inhaler. Lifestyle  Reduce the number of triggers in your home. To do this: ? Change your heating and air conditioning filter at least once a month. ? Limit your use of fireplaces and wood stoves. ? Do not smoke. Do not allow smoking in your home. ? Avoid using perfumes and fragrances. ? Get rid of pests, such as roaches and mice, and their droppings. ? Remove any mold from your home. ? Keep your house clean   and dust free. Use unscented cleaning products. ? Replace carpet with wood, tile, or vinyl flooring. Carpet can trap dander and dust. ? Use allergy-proof pillows, mattress covers, and box spring covers. ? Wash bed sheets and blankets every week in hot water. Dry them in a dryer. ? Use blankets that are made of polyester or cotton. ? Wash your hands often. ? Do not allow pets in your bedroom.  Avoid breathing in  cold air when you exercise. General instructions  Have a plan for seeking medical care. Know when to call your health care provider and local emergency services, and where to get emergency care.  Stay up to date on your immunizations.  When you have an episode of bronchospasm, stay calm. Try to relax and breathe more slowly.  If you have asthma, make sure you have an asthma action plan.  Keep all follow-up visits as told by your health care provider. This is important. Contact a health care provider if:  You have muscle aches.  You have chest pain.  The mucus that you cough up (sputum) changes from clear or white to yellow, green, gray, or bloody.  You have a fever.  Your sputum gets thicker. Get help right away if:  Your wheezing and coughing get worse, even after you take your prescribed medicines.  It gets even harder to breathe.  You develop severe chest pain. Summary  Bronchospasm is a tightening of the airways going into the lungs.  During an episode of bronchospasm, you may have a harder time breathing. You may cough and make a whistling sound when you breathe (wheeze).  Avoid exposure to triggers such as smoke, dust, mold, animal dander, and fragrances.  When you have an episode of bronchospasm, stay calm. Try to relax and breathe more slowly. This information is not intended to replace advice given to you by your health care provider. Make sure you discuss any questions you have with your health care provider. Document Released: 02/27/2003 Document Revised: 02/21/2016 Document Reviewed: 02/21/2016 Elsevier Interactive Patient Education  2017 Elsevier Inc.  

## 2017-12-23 NOTE — Progress Notes (Signed)
Chief Complaint  Patient presents with  . Cough    in hospital x 3 weeks ago for cough and it never went away, back worse 3 days ago and pt c/o of sob and not being able to breathe.  Mucus drainage green/yellow.  Taking delsym, nyquil and dayquil for sxs.  No fevers present.    HPI  Pt reports that she has been cough She was hospitalized for 3 days for asthma exacerbation back 3 weeks ago  She was discharged home with duoneb, prednisone, albuterol  She states that she also had an antibiotic zithromax She reports that her symptoms did not resolve 3 days ago she started having recurrent symptoms  With shortness of breath, cough, greenish yellow mucus She is also wheezing  Her usual triggers are dust, environmental pollutants, certain chemicals Prior to this episode she would rate her asthma as well controlled   Past Medical History:  Diagnosis Date  . Anxiety   . Asthma   . Depression   . GERD (gastroesophageal reflux disease)   . Heel spur    bilat  . History of bronchitis   . History of chicken pox   . History of urinary tract infection   . Migraines   . Plantar fasciitis    bilat  . STD (sexually transmitted disease)    chl hx & hsv 1&2  . Tremors of nervous system   . Urinary incontinence     Current Outpatient Medications  Medication Sig Dispense Refill  . acetaminophen (TYLENOL) 500 MG tablet Take 1,000 mg by mouth every 6 (six) hours as needed for mild pain.    Marland Kitchen albuterol (PROVENTIL HFA;VENTOLIN HFA) 108 (90 Base) MCG/ACT inhaler Inhale 2 puffs into the lungs every 6 (six) hours as needed. 6.7 g 1  . ALPRAZolam (XANAX) 1 MG tablet TAKE 1/2 TO 1 TABLET BY MOUTH EVERY 12 HOURS AS NEEDED (FILLABLE 01/24/16)  0  . azithromycin (ZITHROMAX) 250 MG tablet Take 1 tablet once a day x 5 days. 5 each 0  . buPROPion (WELLBUTRIN XL) 300 MG 24 hr tablet Take 300 mg by mouth every morning.   0  . cyclobenzaprine (FLEXERIL) 5 MG tablet Take 1 tablet (5 mg total) by mouth at  bedtime. 10 tablet 0  . famotidine (PEPCID) 20 MG tablet Take 20 mg by mouth 2 (two) times daily.    Marland Kitchen ipratropium-albuterol (DUONEB) 0.5-2.5 (3) MG/3ML SOLN Take 3 mLs by nebulization every 6 (six) hours. 180 mL 0  . Multiple Vitamin (MULTIVITAMIN WITH MINERALS) TABS tablet Take 1 tablet by mouth daily.    . promethazine (PHENERGAN) 12.5 MG tablet 1 (ONE) TABLET BY MOUTH EVERY FOUR HOURS, AS NEEDED  0  . REXULTI 1 MG TABS Take 1 tablet by mouth daily.  3  . SUMAtriptan (IMITREX) 100 MG tablet ! Tab at onset of HA. May repeat in 2 hours if headache persists or recurs.No more than 4 doses a month. 10 tablet 5  . doxycycline (VIBRA-TABS) 100 MG tablet Take 1 tablet (100 mg total) by mouth 2 (two) times daily. 20 tablet 0  . fluconazole (DIFLUCAN) 150 MG tablet Take 1 tablet (150 mg total) by mouth once for 1 dose. Take second tablet 3 days 2 tablet 0  . pantoprazole (PROTONIX) 40 MG tablet Take 1 tablet (40 mg total) by mouth daily. 90 tablet 3  . predniSONE (STERAPRED UNI-PAK 21 TAB) 10 MG (21) TBPK tablet Take 2 tablets (20 mg total) by mouth daily for 2  days, THEN 2 tablets (20 mg total) daily for 5 days. 14 tablet 0   No current facility-administered medications for this visit.     Allergies:  Allergies  Allergen Reactions  . Gabapentin Anaphylaxis    Past Surgical History:  Procedure Laterality Date  . LAPAROSCOPIC GASTRIC SLEEVE RESECTION N/A 11/27/2014   Procedure: LAPAROSCOPIC GASTRIC SLEEVE RESECTION WITH HIATAL HERNIA REPAIR UPPER ENDOSCOPY;  Surgeon: Luretha Murphy, MD;  Location: [facility];  Service: General;  Laterality: N/A;  . TONSILLECTOMY  1978    Social History   Socioeconomic History  . Marital status: Single    Spouse name: Not on file  . Number of children: Not on file  . Years of education: 77  . Highest education level: Not on file  Occupational History  . Occupation: Chartered certified accountant: [employer]  Social Needs  . Financial resource strain: Not on  file  . Food insecurity:    Worry: Not on file    Inability: Not on file  . Transportation needs:    Medical: Not on file    Non-medical: Not on file  Tobacco Use  . Smoking status: Former Smoker    Packs/day: 0.25    Years: 5.00    Pack years: 1.25    Types: Cigarettes    Last attempt to quit: 03/10/2012    Years since quitting: 5.7  . Smokeless tobacco: Never Used  Substance and Sexual Activity  . Alcohol use: Yes    Alcohol/week: 0.0 standard drinks    Comment: 1 a month  . Drug use: No  . Sexual activity: Yes    Partners: Male    Birth control/protection: Condom  Lifestyle  . Physical activity:    Days per week: Not on file    Minutes per session: Not on file  . Stress: Not on file  Relationships  . Social connections:    Talks on phone: Not on file    Gets together: Not on file    Attends religious service: Not on file    Active member of club or organization: Not on file    Attends meetings of clubs or organizations: Not on file    Relationship status: Not on file  Other Topics Concern  . Not on file  Social History Narrative   Regular exercise-no   Caffeine Use-yes, 1-2 cups of caffeine daily    Family History  Problem Relation Age of Onset  . Diabetes Mother   . Hypertension Mother   . Hyperlipidemia Mother   . Kidney disease Mother   . Cancer Father        pancreatic  . Hypertension Maternal Grandmother   . Diabetes Maternal Grandmother   . Heart failure Maternal Grandmother        CHF  . Heart attack Maternal Grandfather   . Hypertension Maternal Grandfather   . Hypertension Paternal Grandmother   . Alzheimer's disease Paternal Grandmother   . Hypertension Paternal Grandfather   . Cancer Maternal Uncle        melanoma  . Cancer Paternal Uncle        kidney     ROS Review of Systems See HPI Constitution: No fevers or chills No malaise No diaphoresis Skin: No rash or itching Eyes: no blurry vision, no double vision GU: no dysuria or  hematuria Neuro: no dizziness or headaches all others reviewed and negative   Objective: Vitals:   12/23/17 1026  BP: 132/84  Pulse: 85  Resp: 17  Temp: 98.5 F (36.9 C)  TempSrc: Oral  SpO2: 93%  Weight: (!) 337 lb 9.6 oz (153.1 kg)  Height: 5\' 6"  (1.676 m)    Physical Exam General: alert, oriented, in NAD Head: normocephalic, atraumatic, no sinus tenderness Eyes: EOM intact, no scleral icterus or conjunctival injection Ears: TM clear bilaterally Nose: mucosa nonerythematous, nonedematous Throat: no pharyngeal exudate or erythema Lymph: no posterior auricular, submental or cervical lymph adenopathy Heart: normal rate, normal sinus rhythm, no murmurs Lungs: +wheezing in the bilateral apices After Duoneb wheezing resolved    CLINICAL DATA:  Shortness of breath, cough  EXAM: CHEST - 2 VIEW  COMPARISON:  09/25/2014  FINDINGS: Lungs are clear.  No pleural effusion or pneumothorax.  The heart is normal in size.  Visualized osseous structures are within normal limits.  IMPRESSION: Normal chest radiographs.   Electronically Signed   By: Charline Bills M.D.   On: 11/29/2017 15:45   Assessment and Plan Edward was seen today for cough.  Diagnoses and all orders for this visit:  Wheezing -     POCT CBC  Shortness of breath -     POCT CBC  Mild intermittent asthma with acute exacerbation -     POCT CBC -     albuterol (PROVENTIL) (2.5 MG/3ML) 0.083% nebulizer solution 2.5 mg -     ipratropium (ATROVENT) nebulizer solution 0.5 mg -     predniSONE (STERAPRED UNI-PAK 21 TAB) 10 MG (21) TBPK tablet; Take 2 tablets (20 mg total) by mouth daily for 2 days, THEN 2 tablets (20 mg total) daily for 5 days. -     fluconazole (DIFLUCAN) 150 MG tablet; Take 1 tablet (150 mg total) by mouth once for 1 dose. Take second tablet 3 days  Asthmatic bronchitis with exacerbation, mild intermittent -     fluconazole (DIFLUCAN) 150 MG tablet; Take 1 tablet (150 mg  total) by mouth once for 1 dose. Take second tablet 3 days  Asthma with acute exacerbation -     albuterol (PROVENTIL HFA;VENTOLIN HFA) 108 (90 Base) MCG/ACT inhaler; Inhale 2 puffs into the lungs every 6 (six) hours as needed.  Other orders -     doxycycline (VIBRA-TABS) 100 MG tablet; Take 1 tablet (100 mg total) by mouth 2 (two) times daily.  Plan No leukocytosis  And Wheezing improved after Duoneb so did not repeat cxr Will do another round of antibiotic using doxycyline because of the coverage and the cost Will give diflucan should she develop yeast vaginitis Discussed prednisone and albuterol  Continue home breathing treatments  Follow up in 2 weeks    [place] Boys A Creta Levin

## 2017-12-28 ENCOUNTER — Other Ambulatory Visit: Payer: Self-pay | Admitting: Family Medicine

## 2017-12-29 NOTE — Telephone Encounter (Signed)
Attempted to contact pt regarding follow up appointment; per Dr Julieta Gutting note, pt to follow up in 2 weeks; no upcoming appointment noted; the pt also requested refill for tessalon; original order written 12/23/17 prescribed 100 mg bid prn; the pt says that she has been taking them 2 at time because 1 is not effective; the pt says that she is using her inhaler and nebulizer but she feels better; explained to pt that since the medication, as order is not helping, and she is not taking the medication as ordered, and it is necessary to notify the physician; I also explained that the provider may opt to send in a prescription for more medication, or may require that she be seen in the office; the pt also scheduled her 2 week follow up appointment with Dr Collie Siad, [address] [place], 01/08/18 at 1000; the pt verbalized understanding; also spoke with Caitlyn at [facility] regarding scheduling the pt.the pt can be contacted at [phone].   Requested Prescriptions  Pending Prescriptions Disp Refills  . benzonatate (TESSALON) 100 MG capsule [Pharmacy Med Name: BENZONATATE 100 MG CAPSULE] 20 capsule 0    Sig: TAKE 1 CAPSULE (100 MG TOTAL) BY MOUTH 2 (TWO) TIMES DAILY AS NEEDED FOR COUGH.     Ear, Nose, and Throat:  Antitussives/Expectorants Passed - 12/28/2017 10:14 AM      Passed - Valid encounter within last 12 months    Recent Outpatient Visits          6 days ago Wheezing   [facility] at [facility], Zoe A, MD   3 months ago Gastroesophageal reflux disease, esophagitis presence not specified   [facility] at [facility], Eilleen Kempf, MD   7 months ago Cough   [facility], Dema Severin, PA-C   9 months ago Back muscle spasm   [facility] at [facility], Zoe A, MD   10 months ago Pap smear for cervical cancer screening   [facility] at [facility], Eilleen Kempf, MD      Future Appointments            In 1 week Doristine Bosworth, MD [facility] at [place], [facility]

## 2017-12-29 NOTE — Telephone Encounter (Signed)
Patient called back said she was still waiting on the Rx for benzonatate (TESSALON) 100 MG capsule to be sent to pharmacy. Request a call back please. Ph# [phone]

## 2017-12-30 ENCOUNTER — Other Ambulatory Visit: Payer: Self-pay | Admitting: Family Medicine

## 2018-01-08 ENCOUNTER — Ambulatory Visit: Payer: Self-pay | Admitting: Family Medicine

## 2018-01-08 NOTE — Progress Notes (Deleted)
No chief complaint on file.   HPI  4 review of systems  Past Medical History:  Diagnosis Date  . Anxiety   . Asthma   . Depression   . GERD (gastroesophageal reflux disease)   . Heel spur    bilat  . History of bronchitis   . History of chicken pox   . History of urinary tract infection   . Migraines   . Plantar fasciitis    bilat  . STD (sexually transmitted disease)    chl hx & hsv 1&2  . Tremors of nervous system   . Urinary incontinence     Current Outpatient Medications  Medication Sig Dispense Refill  . acetaminophen (TYLENOL) 500 MG tablet Take 1,000 mg by mouth every 6 (six) hours as needed for mild pain.    Marland Kitchen albuterol (PROVENTIL HFA;VENTOLIN HFA) 108 (90 Base) MCG/ACT inhaler Inhale 2 puffs into the lungs every 6 (six) hours as needed. 6.7 g 1  . benzonatate (TESSALON) 100 MG capsule TAKE 1 CAPSULE (100 MG TOTAL) BY MOUTH 2 (TWO) TIMES DAILY AS NEEDED FOR COUGH. 20 capsule 1  . doxycycline (VIBRA-TABS) 100 MG tablet Take 1 tablet (100 mg total) by mouth 2 (two) times daily. 20 tablet 0  . famotidine (PEPCID) 20 MG tablet Take 20 mg by mouth 2 (two) times daily.    Marland Kitchen ipratropium-albuterol (DUONEB) 0.5-2.5 (3) MG/3ML SOLN Take 3 mLs by nebulization every 6 (six) hours. 180 mL 0  . Multiple Vitamin (MULTIVITAMIN WITH MINERALS) TABS tablet Take 1 tablet by mouth daily.    . predniSONE (DELTASONE) 20 MG tablet Take 60mg  on day 1, 60mg  on day 2, then take 40mg  by mouth for the next 5 days 16 tablet 0  . SUMAtriptan (IMITREX) 100 MG tablet ! Tab at onset of HA. May repeat in 2 hours if headache persists or recurs.No more than 4 doses a month. 10 tablet 5   No current facility-administered medications for this visit.     Allergies:  Allergies  Allergen Reactions  . Gabapentin Anaphylaxis    Past Surgical History:  Procedure Laterality Date  . 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 TONSILLECTOMY  1978    Social History   Socioeconomic History  . Marital status: Single    Spouse name: Not on file  . Number of children: Not on file  . Years of education: 33  . Highest education level: Not on file  Occupational History  . Occupation: Chartered certified accountant: [employer]  Social Needs  . Financial resource strain: Not on file  . Food insecurity:    Worry: Not on file    Inability: Not on file  . Transportation needs:    Medical: Not on file    Non-medical: Not on file  Tobacco Use  . Smoking status: Former Smoker    Packs/day: 0.25    Years: 5.00    Pack years: 1.25    Types: Cigarettes    Last attempt to quit: 03/10/2012    Years since quitting: 5.8  . Smokeless tobacco: Never Used  Substance and Sexual Activity  . Alcohol use: Yes    Alcohol/week: 0.0 standard drinks    Comment: 1 a month  . Drug use: No  . Sexual activity: Yes    Partners: Male  Birth control/protection: Condom  Lifestyle  . Physical activity:    Days per week: Not on file    Minutes per session: Not on file  . Stress: Not on file  Relationships  . Social connections:    Talks on phone: Not on file    Gets together: Not on file    Attends religious service: Not on file    Active member of club or organization: Not on file    Attends meetings of clubs or organizations: Not on file    Relationship status: Not on file  Other Topics Concern  . Not on file  Social History Narrative   Regular exercise-no   Caffeine Use-yes, 1-2 cups of caffeine daily    Family History  Problem Relation Age of Onset  . Diabetes Mother   . Hypertension Mother   . Hyperlipidemia Mother   . Kidney disease Mother   . Cancer Father        pancreatic  . Hypertension Maternal Grandmother   . Diabetes Maternal Grandmother   . Heart failure Maternal Grandmother        CHF    . Heart attack Maternal Grandfather   . Hypertension Maternal Grandfather   . Hypertension Paternal Grandmother   . Alzheimer's disease Paternal Grandmother   . Hypertension Paternal Grandfather   . Cancer Maternal Uncle        melanoma  . Cancer Paternal Uncle        kidney     ROS Review of Systems See HPI Constitution: No fevers or chills No malaise No diaphoresis Skin: No rash or itching Eyes: no blurry vision, no double vision GU: no dysuria or hematuria Neuro: no dizziness or headaches * all others reviewed and negative   Objective: There were no vitals filed for this visit.  Physical Exam  Assessment and Plan There are no diagnoses linked to this encounter.   [employer]

## 2018-01-22 ENCOUNTER — Ambulatory Visit: Payer: Self-pay | Admitting: Family Medicine

## 2018-01-22 VITALS — BP 138/86 | HR 82 | Temp 97.7°F | Resp 17 | Wt 339.4 lb

## 2018-01-22 DIAGNOSIS — R05 Cough: Secondary | ICD-10-CM

## 2018-01-22 DIAGNOSIS — R059 Cough, unspecified: Secondary | ICD-10-CM

## 2018-01-22 DIAGNOSIS — R062 Wheezing: Secondary | ICD-10-CM

## 2018-01-22 DIAGNOSIS — J45901 Unspecified asthma with (acute) exacerbation: Secondary | ICD-10-CM

## 2018-01-22 MED ORDER — MONTELUKAST SODIUM 10 MG PO TABS: 10.0000 mg | ORAL_TABLET | Freq: Every day | ORAL | 3 refills | Status: DC

## 2018-01-22 MED ORDER — BENZONATATE 200 MG PO CAPS: 200.0000 mg | ORAL_CAPSULE | Freq: Three times a day (TID) | ORAL | 0 refills | Status: DC | PRN

## 2018-01-22 MED ORDER — PREDNISONE 20 MG PO TABS: 60.0000 mg | ORAL_TABLET | Freq: Every day | ORAL | 0 refills | Status: AC

## 2018-01-22 MED ORDER — AMOXICILLIN-POT CLAVULANATE 875-125 MG PO TABS: 1.0000 | ORAL_TABLET | Freq: Two times a day (BID) | ORAL | 0 refills | Status: AC

## 2018-01-22 MED ORDER — FLUCONAZOLE 150 MG PO TABS: 150.0000 mg | ORAL_TABLET | Freq: Once | ORAL | 0 refills | Status: AC

## 2018-01-22 NOTE — Progress Notes (Signed)
Alyssa Blanchard is a 47 y.o. female who presents today with concerns of shortness of breath and increased use of inhaler. Patient has utilized her inhaler more frequently without relief of symptoms. She reports a cough that "feels like it is every 5 minutes" last inhaler use 90 minutes prior to arrival. She has been seen for this condition in the past and treated with antibiotics azithromycin and Doxycycline- symptoms usually return within 30-45 days after initial treatment. Patient with increased work of breathing, sats 94% and frequent dry cough.  Review of Systems  Constitutional: Negative for chills, fever and malaise/fatigue.  HENT: Negative for congestion, ear discharge, ear pain, sinus pain and sore throat.   Eyes: Negative.   Respiratory: Positive for cough, shortness of breath and wheezing. Negative for sputum production.   Cardiovascular: Negative.  Negative for chest pain.  Gastrointestinal: Negative for abdominal pain, diarrhea, nausea and vomiting.  Genitourinary: Negative for dysuria, frequency, hematuria and urgency.  Musculoskeletal: Negative for myalgias.  Skin: Negative.   Neurological: Negative for headaches.  Endo/Heme/Allergies: Negative.   Psychiatric/Behavioral: Negative.     O: Vitals:   01/22/18 1024  BP: 138/86  Pulse: 82  Resp: 17  Temp: 97.7 F (36.5 C)  SpO2: 95%     Physical Exam  Constitutional: She is oriented to person, place, and time. She appears well-developed and well-nourished. She is active.  Non-toxic appearance. She does not have a sickly appearance. She appears ill. She appears distressed. She is not intubated.  HENT:  Head: Normocephalic.  Right Ear: Hearing, tympanic membrane, external ear and ear canal normal.  Left Ear: Hearing, tympanic membrane, external ear and ear canal normal.  Nose: Rhinorrhea present. Right sinus exhibits no maxillary sinus tenderness and no frontal sinus tenderness. Left sinus exhibits no maxillary sinus tenderness  and no frontal sinus tenderness.  Mouth/Throat: Uvula is midline. Posterior oropharyngeal erythema present. Tonsils are 1+ on the right. Tonsils are 1+ on the left. No tonsillar exudate.  Neck: Normal range of motion. Neck supple.  Cardiovascular: Normal rate, regular rhythm, normal heart sounds and normal pulses.  Pulmonary/Chest: Effort normal. No accessory muscle usage. Tachypnea noted. No bradypnea. She is not intubated. No respiratory distress. She has wheezes in the right upper field, the right middle field, the right lower field, the left upper field, the left middle field and the left lower field. She has rhonchi in the right upper field, the right middle field, the right lower field, the left upper field, the left middle field and the left lower field.  Abdominal: Soft. Bowel sounds are normal.  Musculoskeletal: Normal range of motion.  Lymphadenopathy:       Head (right side): Tonsillar adenopathy present. No submental and no submandibular adenopathy present.       Head (left side): Tonsillar adenopathy present. No submental and no submandibular adenopathy present.    She has no cervical adenopathy.  Neurological: She is alert and oriented to person, place, and time.  Psychiatric: She has a normal mood and affect.  Vitals reviewed.  A: 1. Asthma with acute exacerbation, unspecified asthma severity, unspecified whether persistent    P: Discussed exam findings, diagnosis etiology and medication use and indications reviewed with patient. Follow- Up and discharge instructions provided. No emergent/urgent issues found on exam.  Patient verbalized understanding of information provided and agrees with plan of care (POC), all questions answered.  PLAN< 1. Treat acute on chronic exacerbation- changed antibiotic class to one that she has not used in the last  90 days. Cough medication. 2. Initiate singular for maintenance until eval and follow up with specialist can be obtained 3. Help with  coordination of patient with specialist evaluation  1. Asthma with acute exacerbation, unspecified asthma severity, unspecified whether persistent - montelukast (SINGULAIR) 10 MG tablet; Take 1 tablet (10 mg total) by mouth at bedtime. - predniSONE (DELTASONE) 20 MG tablet; Take 3 tablets (60 mg total) by mouth daily with breakfast for 5 days. - fluconazole (DIFLUCAN) 150 MG tablet; Take 1 tablet (150 mg total) by mouth once for 1 dose. - amoxicillin-clavulanate (AUGMENTIN) 875-125 MG tablet; Take 1 tablet by mouth 2 (two) times daily for 10 days. - benzonatate (TESSALON) 200 MG capsule; Take 1 capsule (200 mg total) by mouth 3 (three) times daily as needed.  Appointment with [facility] and allergy made for patient who has had 3 acute on chronic asthma like exacerbations requiring antibitotics and 1 requiring  hospitalization related to similar respiratory conditions. Patient is not insured at this time potentially complicating symptom resolution. Concern is that symptoms are not comprehensively evaluated and that potential for improvement rest with knowledge of pulmonary function and maintenance medication and trigger identification.

## 2018-01-22 NOTE — Patient Instructions (Addendum)
Asthma, Adult   PLAN< Mucinex- DM or equivalent OTC cough and congestion medication Increased hydration Use of nebulizer on a schedule Follow up with pulmonology     Asthma is a recurring condition in which the airways tighten and narrow. Asthma can make it difficult to breathe. It can cause coughing, wheezing, and shortness of breath. Asthma episodes, also called asthma attacks, range from minor to life-threatening. Asthma cannot be cured, but medicines and lifestyle changes can help control it. What are the causes? Asthma is believed to be caused by inherited (genetic) and environmental factors, but its exact cause is unknown. Asthma may be triggered by allergens, lung infections, or irritants in the air. Asthma triggers are different for each person. Common triggers include:  Animal dander.  Dust mites.  Cockroaches.  Pollen from trees or grass.  Mold.  Smoke.  Air pollutants such as dust, household cleaners, hair sprays, aerosol sprays, paint fumes, strong chemicals, or strong odors.  Cold air, weather changes, and winds (which increase molds and pollens in the air).  Strong emotional expressions such as crying or laughing hard.  Stress.  Certain medicines (such as aspirin) or types of drugs (such as beta-blockers).  Sulfites in foods and drinks. Foods and drinks that may contain sulfites include dried fruit, potato chips, and sparkling grape juice.  Infections or inflammatory conditions such as the flu, a cold, or an inflammation of the nasal membranes (rhinitis).  Gastroesophageal reflux disease (GERD).  Exercise or strenuous activity.  What are the signs or symptoms? Symptoms may occur immediately after asthma is triggered or many hours later. Symptoms include:  Wheezing.  Excessive nighttime or early morning coughing.  Frequent or severe coughing with a common cold.  Chest tightness.  Shortness of breath.  How is this diagnosed? The diagnosis of  asthma is made by a review of your medical history and a physical exam. Tests may also be performed. These may include:  Lung function studies. These tests show how much air you breathe in and out.  Allergy tests.  Imaging tests such as X-rays.  How is this treated? Asthma cannot be cured, but it can usually be controlled. Treatment involves identifying and avoiding your asthma triggers. It also involves medicines. There are 2 classes of medicine used for asthma treatment:  Controller medicines. These prevent asthma symptoms from occurring. They are usually taken every day.  Reliever or rescue medicines. These quickly relieve asthma symptoms. They are used as needed and provide short-term relief.  Your health care provider will help you create an asthma action plan. An asthma action plan is a written plan for managing and treating your asthma attacks. It includes a list of your asthma triggers and how they may be avoided. It also includes information on when medicines should be taken and when their dosage should be changed. An action plan may also involve the use of a device called a peak flow meter. A peak flow meter measures how well the lungs are working. It helps you monitor your condition. Follow these instructions at home:  Take medicines only as directed by your health care provider. Speak with your health care provider if you have questions about how or when to take the medicines.  Use a peak flow meter as directed by your health care provider. Record and keep track of readings.  Understand and use the action plan to help minimize or stop an asthma attack without needing to seek medical care.  Control your home environment in the  following ways to help prevent asthma attacks: ? Do not smoke. Avoid being exposed to secondhand smoke. ? Change your heating and air conditioning filter regularly. ? Limit your use of fireplaces and wood stoves. ? Get rid of pests (such as roaches and  mice) and their droppings. ? Throw away plants if you see mold on them. ? Clean your floors and dust regularly. Use unscented cleaning products. ? Try to have someone else vacuum for you regularly. Stay out of rooms while they are being vacuumed and for a short while afterward. If you vacuum, use a dust mask from a hardware store, a double-layered or microfilter vacuum cleaner bag, or a vacuum cleaner with a HEPA filter. ? Replace carpet with wood, tile, or vinyl flooring. Carpet can trap dander and dust. ? Use allergy-proof pillows, mattress covers, and box spring covers. ? Wash bed sheets and blankets every week in hot water and dry them in a dryer. ? Use blankets that are made of polyester or cotton. ? Clean bathrooms and kitchens with bleach. If possible, have someone repaint the walls in these rooms with mold-resistant paint. Keep out of the rooms that are being cleaned and painted. ? Wash hands frequently. Contact a health care provider if:  You have wheezing, shortness of breath, or a cough even if taking medicine to prevent attacks.  The colored mucus you cough up (sputum) is thicker than usual.  Your sputum changes from clear or white to yellow, green, gray, or bloody.  You have any problems that may be related to the medicines you are taking (such as a rash, itching, swelling, or trouble breathing).  You are using a reliever medicine more than 2-3 times per week.  Your peak flow is still at 50-79% of your personal best after following your action plan for 1 hour.  You have a fever. Get help right away if:  You seem to be getting worse and are unresponsive to treatment during an asthma attack.  You are short of breath even at rest.  You get short of breath when doing very little physical activity.  You have difficulty eating, drinking, or talking due to asthma symptoms.  You develop chest pain.  You develop a fast heartbeat.  You have a bluish color to your lips or  fingernails.  You are light-headed, dizzy, or faint.  Your peak flow is less than 50% of your personal best. This information is not intended to replace advice given to you by your health care provider. Make sure you discuss any questions you have with your health care provider. Document Released: 02/24/2005 Document Revised: 08/08/2015 Document Reviewed: 09/23/2012 Elsevier Interactive Patient Education  2017 [employer]

## 2018-01-24 ENCOUNTER — Telehealth: Payer: Self-pay | Admitting: Family Medicine

## 2018-01-24 DIAGNOSIS — R059 Cough, unspecified: Secondary | ICD-10-CM

## 2018-01-24 DIAGNOSIS — R05 Cough: Secondary | ICD-10-CM

## 2018-01-24 MED ORDER — PROMETHAZINE-DM 6.25-15 MG/5ML PO SYRP: 5.0000 mL | ORAL_SOLUTION | Freq: Four times a day (QID) | ORAL | 0 refills | Status: DC | PRN

## 2018-01-24 NOTE — Telephone Encounter (Signed)
Alyssa Blanchard 47 y.o. female who was seen in the last 48 hours for acute asthma exacerbation and treated with abx, singular, prednisone, and tessalon perles. She reports minimal improvement. Historically has admission for asthma exacerbation 11/2017 (3 day hospitalization, azithromycin abx) and then PCP eval 10/16 where she was once again treated for acute asthma exacerbation (supportive care with bronchodilators, steroid and doxycycline) abx. Today will marks the 3rd exacerbation in the last 90 days with relapse of symptoms within 30 days of most recent intervention. Discussed with patient the potential need for specialist intervention witj an allergy and asthma provider an appointment was made for her on 9/21 in about 6 days from first [facility] visit 11/15.  Alyssa Blanchard called [facility] this morning with concern of persistent cough and is seeking additional medication for cough.   Consulted supervising MD Dr. Miller to discuss potential treatment plan options due to patient non-improvement limited evaluation (labs,imaging) capabilities in this setting.  1. In person follow in in 24 hours if patient is decompensated referral to higher level of care  2. Cough: Promethazine DM trial for cough symptoms  3. Ensure patient keeps Allergy and Asthma appointment  4. If above is not effective patient is to have all additional treatment and follow up related to this condition at higher level of care to ensure comprehensive evaluation for expanded differential, labs and imaging considerations. Alyssa Blanchard verbalized understanding of the above information and agrees with the plan of care at this time. She did confirm that she understands the importance of allergy and asthma evaluation and she has plans to attend her appointment. The emergency department is the plan for urgent/emergent symptom management. 

## 2018-01-24 NOTE — Telephone Encounter (Signed)
Alyssa Blanchard 47 y.o. female who was seen in the last 48 hours for acute asthma exacerbation and treated with abx, singular, prednisone, and tessalon perles. She reports minimal improvement. Historically has admission for asthma exacerbation 11/2017 (3 day hospitalization, azithromycin abx) and then PCP eval 10/16 where she was once again treated for acute asthma exacerbation (supportive care with bronchodilators, steroid and doxycycline) abx. Today will marks the 3rd exacerbation in the last 90 days with relapse of symptoms within 30 days of most recent intervention. Discussed with patient the potential need for specialist intervention witj an allergy and asthma provider an appointment was made for her on 9/21 in about 6 days [facility]  Alyssa Blanchard called [facility] this morning with concern of persistent cough and is seeking additional medication for cough.   Consulted supervising MD Dr. Hyacinth MeekerMiller to discuss potential treatment plan options due to patient non-improvement limited evaluation (labs,imaging) capabilities in this setting.  1. In person follow in in 24 hours if patient is decompensated referral to higher level of care  2. Cough: Promethazine DM trial for cough symptoms  3. Ensure patient keeps Allergy and Asthma appointment  4. If above is not effective patient is to have all additional treatment and follow up related to this condition at higher level of care to ensure comprehensive evaluation for expanded differential, labs and imaging considerations. Alyssa Blanchard verbalized understanding of the above information and agrees with the plan of care at this time. She did confirm that she understands the importance of allergy and asthma evaluation and she has plans to attend her appointment. The emergency department is the plan for urgent/emergent symptom management.

## 2018-01-25 ENCOUNTER — Telehealth: Payer: Self-pay

## 2018-01-25 NOTE — Telephone Encounter (Signed)
Patient states she feels sick, but her symptoms improved since her visit with us, I offered top review the treatment and plan with the provider and check what the provider can do to make her feel better, but she denied, she said she is ok with the treatment and plan. I also let her know she can call us tomorrow if she still feel sick.

## 2018-01-28 ENCOUNTER — Ambulatory Visit: Payer: Self-pay | Admitting: Allergy

## 2018-02-02 ENCOUNTER — Ambulatory Visit: Payer: Self-pay | Admitting: Allergy

## 2018-02-02 ENCOUNTER — Encounter: Payer: Self-pay | Admitting: Allergy

## 2018-02-02 VITALS — BP 130/90 | HR 89 | Resp 14 | Ht 66.5 in | Wt 340.0 lb

## 2018-02-02 DIAGNOSIS — J4540 Moderate persistent asthma, uncomplicated: Secondary | ICD-10-CM

## 2018-02-02 DIAGNOSIS — J310 Chronic rhinitis: Secondary | ICD-10-CM

## 2018-02-02 MED ORDER — ALBUTEROL SULFATE HFA 108 (90 BASE) MCG/ACT IN AERS: 2.0000 | INHALATION_SPRAY | RESPIRATORY_TRACT | 1 refills | Status: DC | PRN

## 2018-02-02 MED ORDER — MONTELUKAST SODIUM 10 MG PO TABS: 10.0000 mg | ORAL_TABLET | Freq: Every day | ORAL | 3 refills | Status: DC

## 2018-02-02 NOTE — Progress Notes (Signed)
New Patient Note  RE: Alyssa Blanchard MRN: [id number] DOB: 16-Mar-1970 Date of Office Visit: 02/02/2018  Referring provider: Georgina QuintSagardia, Miguel Jose, * Primary care provider: Georgina QuintSagardia, Miguel Jose, MD  Chief Complaint: Asthma  History of Present Illness: I had the pleasure of seeing Alyssa Blanchard for initial evaluation at the [facility] on 02/02/2018. She is a 47 y.o. female, who is referred here by urgent care for the evaluation of asthma.   Asthma: She reports symptoms of chest tightness, shortness of breath, coughing with post tussive emesis at times, wheezing, nocturnal awakenings for 15 years but worsening the last few months. Current medications include albuterol prn and duoneb prn which help. She reports not using aerochamber with asthma inhalers. She tried the following inhalers: none. Main asthma triggers are weather changes, smoke, exercise. In the last month, frequency of asthma symptoms: daily. Frequency of nocturnal symptoms: nightly. Frequency of SABA use: every 4 hours for the duoneb for the past week. Interference with physical activity: yes. Sleep is disturbed.  Patient was admitted to the hospital for 3 days in September for asthma exacerbation. She has been getting antibiotics (zpak, amoxicillin and doxycycline) and oral prednisone x 3 all within the last few months. Patient not sure if the prednisone helps at all. She was started on Singulair the last few weeks with unknown benefit but never been on ICS inhaler.   In the last 12 months, emergency room visits/urgent care visits/doctor office visits or hospitalizations due to asthma: 4+. In the last 12 months, oral steroids courses: 3+ Lifetime history of hospitalization for asthma: once. Prior intubations: no. Asthma was diagnosed at age [age]s by clinical history. History of pneumonia: no. She was not evaluated by allergist/pulmonologist in the past. Smoking exposure: 1 ppd x 10 years, quit over 20 years ago. Up to  date with flu vaccine: yes. Up to date with pneumonia vaccine: yes.   Assessment and Plan: [name] is a 47 y.o. female with: Not well controlled moderate persistent asthma Diagnosed with asthma over 15 years ago which only required albuterol prn. However the last few months she has been having frequent exacerbation requiring at least 3 courses of oral prednisone and 1 hospitalization. She had multiple courses of antibiotics with no benefit and the systemic steroids do not seem to help as much. CXR and EKG in September 2019 were normal.   Today's spirometry showed: normal pattern and no improvement in FEV1 post bronchodilator treatment however patient clinically felt better.   Start symbicort 160 2 puffs twice a day with spacer and rinse mouth afterwards. Demonstrated proper use and samples given.  May use albuterol rescue inhaler 2 puffs or nebulizer every 4 to 6 hours as needed for shortness of breath, chest tightness, coughing, and wheezing. May use albuterol rescue inhaler 2 puffs 5 to 15 minutes prior to strenuous physical activities.  Continue singulair 10mg  daily.  Monitor symptoms. Consider getting full body PFT in future.   Chronic rhinitis Rhinitis symptoms for the past 40 years mainly during the spring and fall. Tried Claritin with some benefit.  Today's skin testing showed: negative to environmental allergies and positive control was borderline questioning the validity of the results.   Recommend further work up with bloodwork to check IgE level and zone 2 however patient does not have insurance at this time. Will defer for now.  Return in about 3 weeks (around 02/23/2018).  Meds ordered this encounter  Medications  . montelukast (SINGULAIR) 10 MG tablet  Sig: Take 1 tablet (10 mg total) by mouth at bedtime.    Dispense:  30 tablet    Refill:  3  . albuterol (PROVENTIL HFA;VENTOLIN HFA) 108 (90 Base) MCG/ACT inhaler    Sig: Inhale 2 puffs into the lungs every 4 (four) hours  as needed for wheezing or shortness of breath.    Dispense:  6.7 g    Refill:  1   Other allergy screening: Asthma: yes Rhino conjunctivitis: yes  She reports symptoms of nasal congestion, coughing, rhinorrhea, itchy/watery eyes. Symptoms have been going on for 40+ years. The symptoms are present during the spring and fall.  Anosmia: no. Headache: yes. She has used Claritin with some improvement in symptoms. Sinus infections: yes one within the past year. Previous work up includes: no. Previous ENT evaluation: no. Previous sinus imaging: no.  Food allergy: no Medication allergy: yes  Gabapentin - SOB, vomiting Hymenoptera allergy: no Urticaria: yes in the past but not recently.  Eczema:no History of recurrent infections suggestive of immunodeficency: no  Diagnostics: Spirometry:  Tracings reviewed. Her effort: Good reproducible efforts. FVC: 3.18L FEV1: 2.42L, 79% predicted FEV1/FVC ratio: 76% Interpretation: Spirometry consistent with normal pattern and no improvement in FEV1 post bronchodilator treatment however patient clinically felt better.  Please see scanned spirometry results for details.  Skin Testing: Environmental allergy panel. Negative test to: environmental allergies.  Results discussed with patient/family. Airborne Adult Perc - 02/02/18 1010    Time Antigen Placed  1010    Allergen Manufacturer  Greer    Location  Back    Number of Test  59    Panel 1  Select    1. Control-Buffer 50% Glycerol  Negative    2. Control-Histamine 1 mg/ml  2+    3. Albumin saline  Negative    4. Bahia  Negative    5. French Southern Territ[place]  Negative    6. Johnson  Negative    7. Kentucky Blue  Negative    8. Meadow Fescue  Negative    9. Perennial Rye  Negative    10. Sweet Vernal  Negative    11. Timothy  Negative    12. Cocklebur  Negative    13. Burweed Marshelder  Negative    14. Ragweed, short  Negative    15. Ragweed, Giant  Negative    16. Plantain,  English  Negative    17.  Lamb's Quarters  Negative    18. Sheep Sorrell  Negative    19. Rough Pigweed  Negative    20. Marsh Elder, Rough  Negative    21. Mugwort, Common  Negative    22. Ash mix  Negative    23. Birch mix  Negative    24. Beech American  Negative    25. Box, Elder  Negative    26. Cedar, red  Negative    27. Cottonwood, Guinea-Bissau  Negative    28. Elm mix  Negative    29. Hickory mix  Negative    30. Maple mix  Negative    31. Oak, Guinea-Bissau mix  Negative    32. Pecan Pollen  Negative    33. Pine mix  Negative    34. Sycamore Eastern  Negative    35. Walnut, Black Pollen  Negative    36. Alternaria alternata  Negative    37. Cladosporium Herbarum  Negative    38. Aspergillus mix  Negative    39. Penicillium mix  Negative    40.  Bipolaris sorokiniana (Helminthosporium)  Negative    41. Drechslera spicifera (Curvularia)  Negative    42. Mucor plumbeus  Negative    43. Fusarium moniliforme  Negative    44. Aureobasidium pullulans (pullulara)  Negative    45. Rhizopus oryzae  Negative    46. Botrytis cinera  Negative    47. Epicoccum nigrum  Negative    48. Phoma betae  Negative    49. Candida Albicans  Negative    50. Trichophyton mentagrophytes  Negative    51. Mite, D Farinae  5,000 AU/ml  Negative    52. Mite, D Pteronyssinus  5,000 AU/ml  Negative    53. Cat Hair 10,000 BAU/ml  Negative    54.  Dog Epithelia  Negative    55. Mixed Feathers  Negative    56. Horse Epithelia  Negative    57. Cockroach, German  Negative    58. Mouse  Negative    59. Tobacco Leaf  Negative       Past Medical History: Patient Active Problem List   Diagnosis Date Noted  . Not well controlled moderate persistent asthma 02/02/2018  . Chronic rhinitis 02/02/2018  . Status asthmaticus 11/29/2017  . Acute respiratory failure with hypoxia (HCC) 11/29/2017  . Gastroesophageal reflux disease 09/01/2017  . Cough 12/31/2016  . Dysuria 07/28/2016  . Clinical infection 07/28/2016  . History of asthma  04/17/2016  . Acute bronchitis 04/17/2016  . 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 Migraine without aura and without status migrainosus, not intractable 10/10/2014  . Migraine with aura and without status migrainosus, not intractable 08/29/2014  . 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 classified elsewhere 08/29/2014  . 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 Snorings 07/10/2014  . Sleep related headaches 07/10/2014  . Nocturia more than twice per night 07/10/2014  . Preventative health care 07/30/2012  . Morbid obesity (HCC) 07/30/2012   Past Medical History:  Diagnosis Date  . Anxiety   . Asthma   . Depression   . GERD (gastroesophageal reflux disease)   . Heel spur    bilat  . History of bronchitis   . History of chicken pox   . History of urinary tract infection   . Migraines   . Plantar fasciitis    bilat  . STD (sexually transmitted disease)    chl hx & hsv 1&2  . Tremors of nervous system   . Urinary incontinence    Past Surgical History: Past Surgical History:  Procedure Laterality Date  . LAPAROSCOPIC GASTRIC SLEEVE RESECTION N/A 11/27/2014   Procedure: LAPAROSCOPIC GASTRIC SLEEVE RESECTION WITH HIATAL HERNIA REPAIR UPPER ENDOSCOPY;  Surgeon: Luretha Murphy, MD;  Location: [facility];  Service: General;  Laterality: N/A;  . TONSILLECTOMY  1978   Medication List:  Current Outpatient Medications  Medication Sig Dispense Refill  . acetaminophen (TYLENOL) 500 MG tablet Take 1,000 mg by mouth every 6 (six) hours as needed for mild pain.    Marland Kitchen albuterol (PROVENTIL HFA;VENTOLIN HFA) 108 (90 Base) MCG/ACT inhaler Inhale 2 puffs into the lungs every 4 (four) hours as needed for wheezing or shortness of breath. 6.7 g 1  . ipratropium-albuterol (DUONEB) 0.5-2.5 (3) MG/3ML SOLN Take 3 mLs by nebulization every 6 (six) hours. 180 mL 0  . JUNEL 1/20 1-20 MG-MCG tablet Take 1 tablet by mouth daily.  4  . montelukast (SINGULAIR) 10 MG tablet Take 1  tablet (10 mg total) by mouth at bedtime. 30 tablet 3  . SUMAtriptan (  IMITREX) 100 MG tablet ! Tab at onset of HA. May repeat in 2 hours if headache persists or recurs.No more than 4 doses a month. 10 tablet 5  . famotidine (PEPCID) 20 MG tablet Take 20 mg by mouth 2 (two) times daily.     No current facility-administered medications for this visit.    Allergies: Allergies  Allergen Reactions  . Gabapentin Anaphylaxis   Social History: Social History   Socioeconomic History  . Marital status: Single    Spouse name: Not on file  . Number of children: Not on file  . Years of education: 73  . Highest education level: Not on file  Occupational History  . Occupation: Chartered certified accountant: [employer]  Social Needs  . Financial resource strain: Not on file  . Food insecurity:    Worry: Not on file    Inability: Not on file  . Transportation needs:    Medical: Not on file    Non-medical: Not on file  Tobacco Use  . Smoking status: Former Smoker    Packs/day: 0.25    Years: 5.00    Pack years: 1.25    Types: Cigarettes    Last attempt to quit: 03/10/2012    Years since quitting: 5.9  . Smokeless tobacco: Never Used  Substance and Sexual Activity  . Alcohol use: Yes    Alcohol/week: 0.0 standard drinks    Comment: 1 a month  . Drug use: No  . Sexual activity: Yes    Partners: Male    Birth control/protection: Condom  Lifestyle  . Physical activity:    Days per week: Not on file    Minutes per session: Not on file  . Stress: Not on file  Relationships  . Social connections:    Talks on phone: Not on file    Gets together: Not on file    Attends religious service: Not on file    Active member of club or organization: Not on file    Attends meetings of clubs or organizations: Not on file    Relationship status: Not on file  Other Topics Concern  . Not on file  Social History Narrative   Regular exercise-no   Caffeine Use-yes, 1-2 cups of caffeine daily    Lives in a 47 year old home. Smoking: 1 ppd x 10 years, quit over 20 years ago Occupation: not employed  Environmental HistorySurveyor, minerals in the house: no Engineer, civil (consulting) in the family room: yes Carpet in the bedroom: no Heating: electric Cooling: central Pet: yes 1 catx 15 yrs  Family History: Family History  Problem Relation Age of Onset  . Diabetes Mother   . Hypertension Mother   . Hyperlipidemia Mother   . Kidney disease Mother   . Cancer Father        pancreatic  . Hypertension Maternal Grandmother   . Diabetes Maternal Grandmother   . Heart failure Maternal Grandmother        CHF  . Heart attack Maternal Grandfather   . Hypertension Maternal Grandfather   . Hypertension Paternal Grandmother   . Alzheimer's disease Paternal Grandmother   . Hypertension Paternal Grandfather   . Cancer Maternal Uncle        melanoma  . Cancer Paternal Uncle        kidney   Problem  Relation Asthma                                   Maternal uncle Eczema                                No  Food allergy                          No  Allergic rhino conjunctivitis     Mother, father  Review of Systems  Constitutional: Negative for appetite change, chills, fever and unexpected weight change.  HENT: Positive for congestion and rhinorrhea.   Eyes: Negative for itching.  Respiratory: Positive for cough, chest tightness, shortness of breath and wheezing.   Cardiovascular: Negative for chest pain.  Gastrointestinal: Negative for abdominal pain.  Genitourinary: Negative for difficulty urinating.  Skin: Negative for rash.  Allergic/Immunologic: Positive for environmental allergies. Negative for food allergies.  Neurological: Negative for headaches.   Objective: BP 130/90 (BP Location: Right Arm, Cuff Size: Large)   Pulse 89   Resp 14   Ht 5' 6.5" (1.689 m)   Wt (!) 340 lb (154.2 kg)   SpO2 97%   BMI 54.06 kg/m  Body mass index is 54.06  kg/m. Physical Exam  Constitutional: She is oriented to person, place, and time. She appears well-developed and well-nourished.  HENT:  Head: Normocephalic and atraumatic.  Right Ear: External ear normal.  Left Ear: External ear normal.  Nose: Mucosal edema (on right side) present.  Mouth/Throat: Oropharynx is clear and moist.  Eyes: Conjunctivae and EOM are normal.  Neck: Neck supple.  Cardiovascular: Normal rate, regular rhythm and normal heart sounds. Exam reveals no gallop and no friction rub.  No murmur heard. Pulmonary/Chest: Effort normal and breath sounds normal. She has no wheezes. She has no rales.  Abdominal: Soft. Bowel sounds are normal. There is no tenderness.  Lymphadenopathy:    She has no cervical adenopathy.  Neurological: She is alert and oriented to person, place, and time.  Skin: Skin is warm. No rash noted.  Psychiatric: She has a normal mood and affect. Her behavior is normal.  Nursing note and vitals reviewed.  The plan was reviewed with the patient/family, and all questions/concerned were addressed.  It was my pleasure to see Jasleen today and participate in her care. Please feel free to contact me with any questions or concerns.  Sincerely,  Wyline Mood, DO Allergy & Immunology  [facility] office: [phone] [facility] office:[phone]

## 2018-02-02 NOTE — Patient Instructions (Addendum)
   Today's testing was negative to indoor and outdoor allergies.   Start symbicort 160 2 puffs twice a day with spacer and rinse mouth afterwards.  May use albuterol rescue inhaler 2 puffs or nebulizer every 4 to 6 hours as needed for shortness of breath, chest tightness, coughing, and wheezing. May use albuterol rescue inhaler 2 puffs 5 to 15 minutes prior to strenuous physical activities.  Continue singulair 10mg  daily  Monitor symptoms  Follow up in 3 weeks.

## 2018-02-02 NOTE — Assessment & Plan Note (Addendum)
Rhinitis symptoms for the past 40 years mainly during the spring and fall. Tried Claritin with some benefit.  Today's skin testing showed: negative to environmental allergies and positive control was borderline questioning the validity of the results.   Recommend further work up with bloodwork to check IgE level and zone 2 however patient does not have insurance at this time. Will defer for now.

## 2018-02-02 NOTE — Assessment & Plan Note (Addendum)
Diagnosed with asthma over 15 years ago which only required albuterol prn. However the last few months she has been having frequent exacerbation requiring at least 3 courses of oral prednisone and 1 hospitalization. She had multiple courses of antibiotics with no benefit and the systemic steroids do not seem to help as much. CXR and EKG in September 2019 were normal.   Today's spirometry showed: normal pattern and no improvement in FEV1 post bronchodilator treatment however patient clinically felt better.   Start symbicort 160 2 puffs twice a day with spacer and rinse mouth afterwards. Demonstrated proper use and samples given.  May use albuterol rescue inhaler 2 puffs or nebulizer every 4 to 6 hours as needed for shortness of breath, chest tightness, coughing, and wheezing. May use albuterol rescue inhaler 2 puffs 5 to 15 minutes prior to strenuous physical activities.  Continue singulair 10mg  daily.  Monitor symptoms. Consider getting full body PFT in future.

## 2018-02-03 ENCOUNTER — Telehealth: Payer: Self-pay | Admitting: Allergy

## 2018-02-03 MED ORDER — BENZONATATE 100 MG PO CAPS: 200.0000 mg | ORAL_CAPSULE | Freq: Three times a day (TID) | ORAL | 0 refills | Status: DC | PRN

## 2018-02-03 NOTE — Telephone Encounter (Signed)
Will send in prescription for tessalon perles °

## 2018-02-03 NOTE — Telephone Encounter (Signed)
Pt came in yesterday and has a very bad cough and would like for you to call in something that will her. [facility] [address] [phone]

## 2018-02-03 NOTE — Telephone Encounter (Signed)
Dr. Selena BattenKim can you please advise?

## 2018-02-25 ENCOUNTER — Ambulatory Visit: Payer: Self-pay | Admitting: Allergy

## 2018-04-10 ENCOUNTER — Other Ambulatory Visit: Payer: Self-pay | Admitting: Allergy

## 2018-06-13 ENCOUNTER — Other Ambulatory Visit: Payer: Self-pay | Admitting: Physician Assistant

## 2018-06-14 NOTE — Telephone Encounter (Signed)
Need to review paper chart not seen in epic  

## 2018-06-15 NOTE — Telephone Encounter (Signed)
Yes, that's perfect.

## 2018-06-15 NOTE — Telephone Encounter (Signed)
So her last refill for lamictal was 08/2017 and has not followed up since then per paper chart. I'm refusing this and suggesting an appt, is that ok?

## 2018-06-19 IMAGING — MG DIGITAL SCREENING BILATERAL MAMMOGRAM WITH CAD
4 series · 4 of 4 positions shown · non-contrast
Comparison: Previous exam(s).

CLINICAL DATA: Screening.

EXAM:
DIGITAL SCREENING BILATERAL MAMMOGRAM WITH CAD

[R CC]
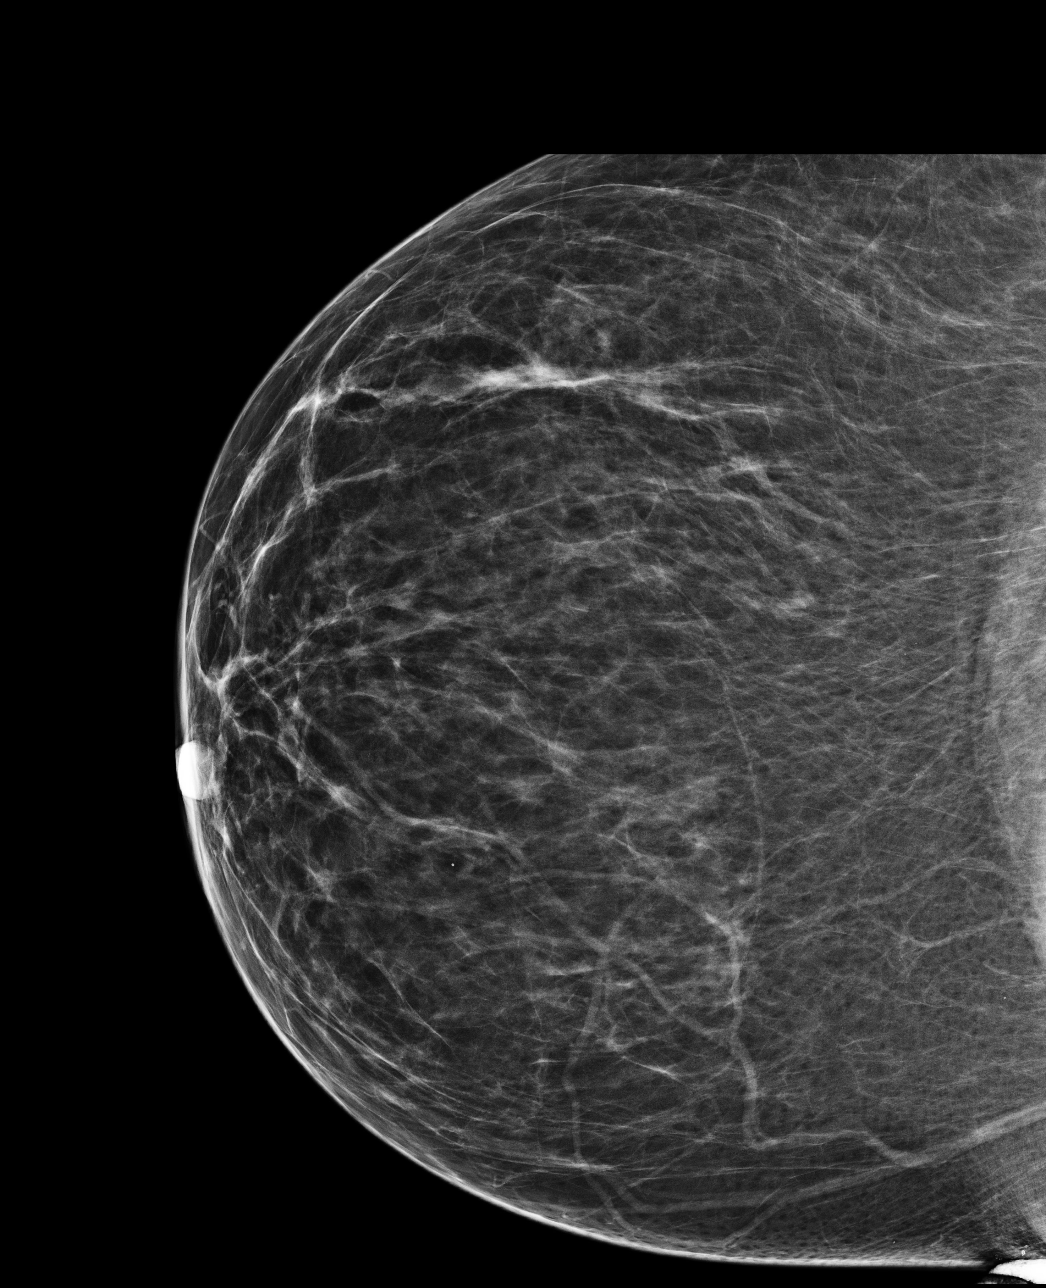

[L MLO]
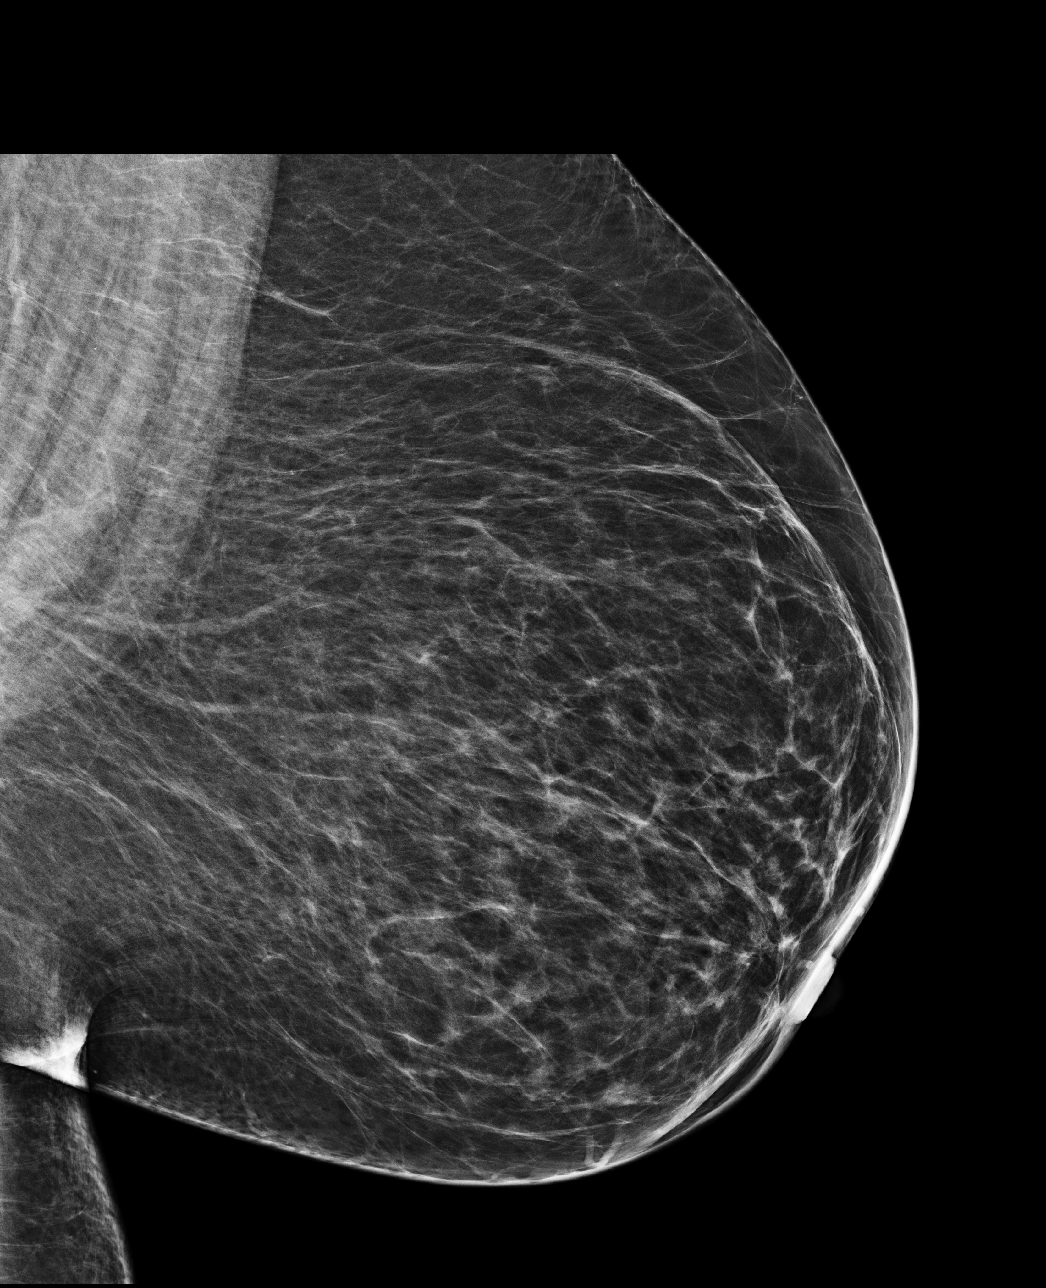

[L CC]
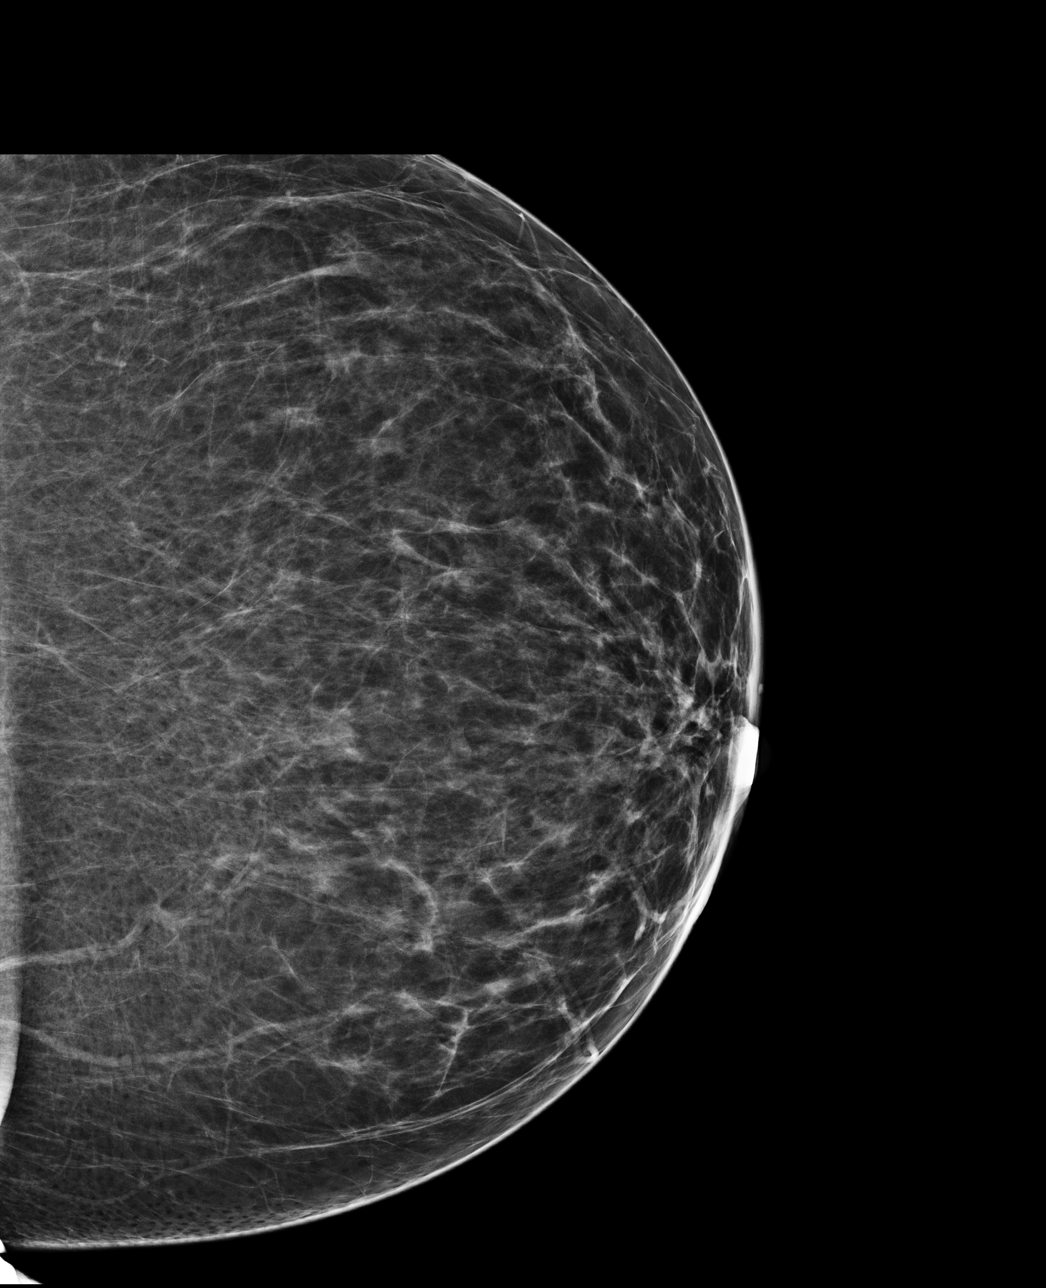

[R MLO]
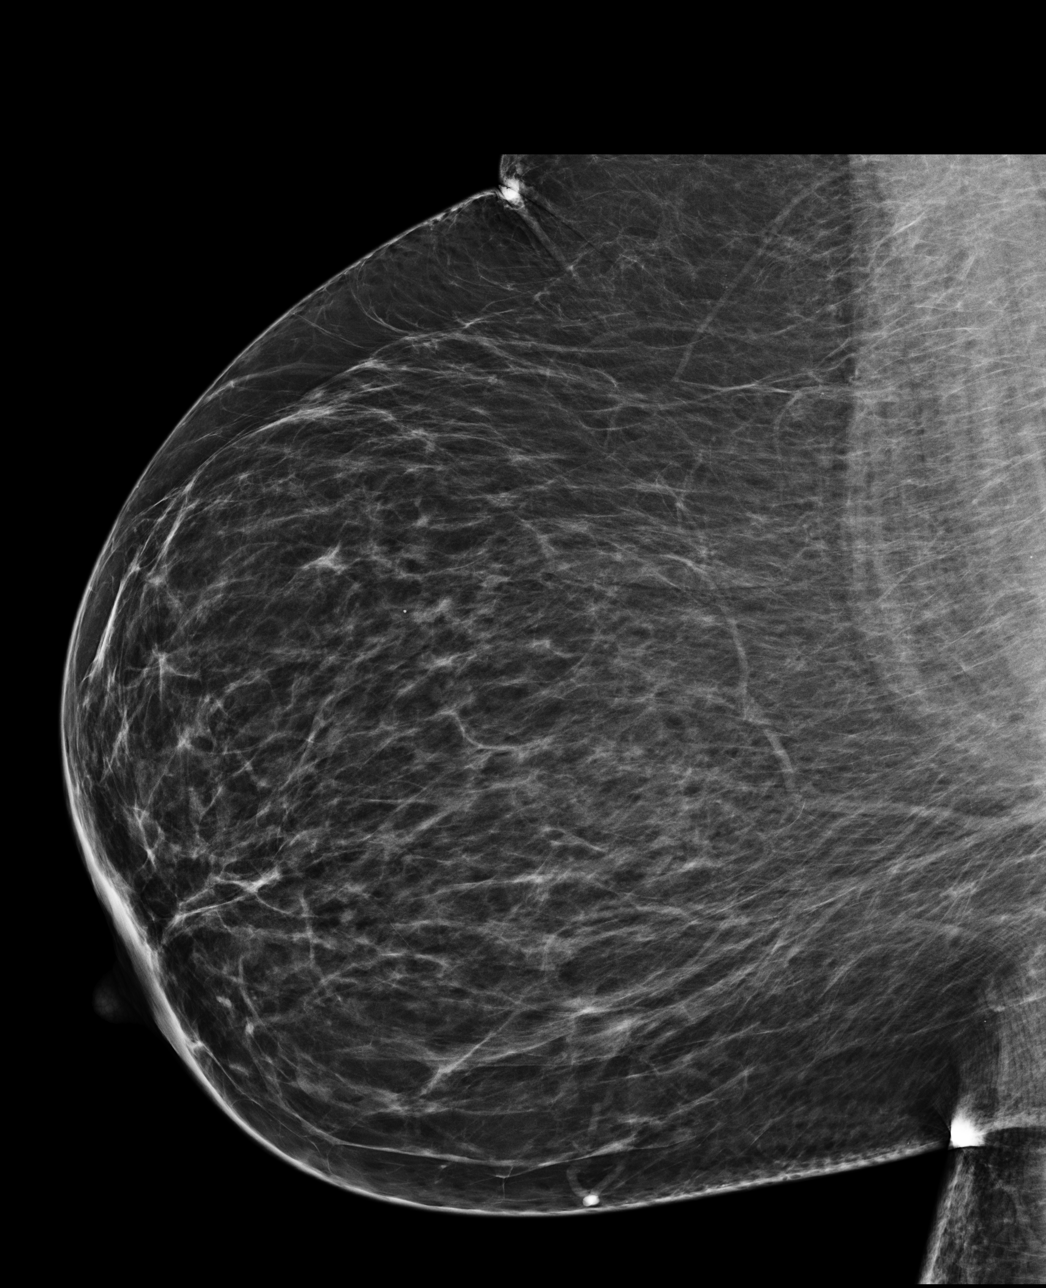

[4 of 4 positions shown; findings below may reference images not displayed]

ACR Breast Density Category b: There are scattered areas of
fibroglandular density.
FINDINGS: There are no findings suspicious for malignancy. Images were
processed with CAD.
IMPRESSION: No mammographic evidence of malignancy. A result letter of this
screening mammogram will be mailed directly to the patient.

RECOMMENDATION:
Screening mammogram in one year. (Code:AS-G-LCT)

BI-RADS CATEGORY  1: Negative.

## 2018-09-09 ENCOUNTER — Other Ambulatory Visit: Payer: Self-pay

## 2018-09-09 MED ORDER — ALBUTEROL SULFATE HFA 108 (90 BASE) MCG/ACT IN AERS: INHALATION_SPRAY | RESPIRATORY_TRACT | 0 refills | Status: DC

## 2018-09-24 ENCOUNTER — Encounter: Payer: Self-pay | Admitting: Emergency Medicine

## 2018-09-28 ENCOUNTER — Telehealth: Payer: Self-pay | Admitting: Emergency Medicine

## 2018-09-28 NOTE — Telephone Encounter (Signed)
Patient stated that she does not have the money to come in for an office visit and plus get her medication filled because she does not have insurance at this time Would like for the provider to fill her medication for at least 30 days until she starts her next job  It was explained to that she may have to have the visit before the prescription is filled     Patient would like a call back from someone in clinical   [facility] [address]

## 2018-10-01 NOTE — Telephone Encounter (Signed)
Patient would like refill on meds for a 30 day supply until able to get ins.

## 2018-10-02 ENCOUNTER — Other Ambulatory Visit: Payer: Self-pay | Admitting: Emergency Medicine

## 2018-10-02 NOTE — Telephone Encounter (Signed)
Which medications? Ask her to be specific please. Thanks.

## 2018-10-21 ENCOUNTER — Other Ambulatory Visit: Payer: Self-pay | Admitting: Emergency Medicine

## 2018-10-21 DIAGNOSIS — K219 Gastro-esophageal reflux disease without esophagitis: Secondary | ICD-10-CM

## 2018-10-21 NOTE — Telephone Encounter (Signed)
Forwarding medication refill request to the clinical pool for review. 

## 2018-10-25 NOTE — Telephone Encounter (Signed)
Pt is asking for a med that was discontinues by [facility]. Pt needs appt

## 2018-12-10 ENCOUNTER — Emergency Department (HOSPITAL_COMMUNITY): Payer: BLUE CROSS/BLUE SHIELD

## 2018-12-10 ENCOUNTER — Encounter (HOSPITAL_COMMUNITY): Payer: Self-pay

## 2018-12-10 ENCOUNTER — Emergency Department (HOSPITAL_COMMUNITY)
Admission: EM | Admit: 2018-12-10 | Discharge: 2018-12-11 | Disposition: A | Discharge status: Home / Self Care | Payer: BLUE CROSS/BLUE SHIELD | Attending: Emergency Medicine | Admitting: Emergency Medicine

## 2018-12-10 ENCOUNTER — Other Ambulatory Visit: Payer: Self-pay

## 2018-12-10 DIAGNOSIS — Z87891 Personal history of nicotine dependence: Secondary | ICD-10-CM | POA: Diagnosis not present

## 2018-12-10 DIAGNOSIS — Z20828 Contact with and (suspected) exposure to other viral communicable diseases: Secondary | ICD-10-CM | POA: Insufficient documentation

## 2018-12-10 DIAGNOSIS — R000 Tachycardia, unspecified: Secondary | ICD-10-CM | POA: Insufficient documentation

## 2018-12-10 DIAGNOSIS — J4541 Moderate persistent asthma with (acute) exacerbation: Secondary | ICD-10-CM | POA: Insufficient documentation

## 2018-12-10 DIAGNOSIS — Z79899 Other long term (current) drug therapy: Secondary | ICD-10-CM | POA: Insufficient documentation

## 2018-12-10 DIAGNOSIS — R0602 Shortness of breath: Secondary | ICD-10-CM | POA: Diagnosis present

## 2018-12-10 NOTE — ED Triage Notes (Signed)
Pt reports SOB, chest pain, tachycardia, and cough since yesterday. She states that around this time of year, she often has asthma flares. Reports admission for similar last October. Pt also hypertensive. She denies any sick contacts or fever.

## 2018-12-11 LAB — BASIC METABOLIC PANEL
Anion gap: 8 (ref 5–15)
BUN: 11 mg/dL (ref 6–20)
CO2: 23 mmol/L (ref 22–32)
Calcium: 8.7 mg/dL — ABNORMAL LOW (ref 8.9–10.3)
Chloride: 106 mmol/L (ref 98–111)
Creatinine, Ser: 0.8 mg/dL (ref 0.44–1.00)
GFR calc Af Amer: >60 mL/min (ref >60)
GFR calc non Af Amer: >60 mL/min (ref >60)
Glucose, Bld: 124 mg/dL — ABNORMAL HIGH (ref 70–99)
Potassium: 3.6 mmol/L (ref 3.5–5.1)
Sodium: 137 mmol/L (ref 135–145)

## 2018-12-11 LAB — CBC WITH DIFFERENTIAL/PLATELET
Abs Immature Granulocytes: 0.05 10*3/uL (ref 0.00–0.07)
Basophils Absolute: 0.1 10*3/uL (ref 0.0–0.1)
Basophils Relative: 1 %
Eosinophils Absolute: 0.1 10*3/uL (ref 0.0–0.5)
Eosinophils Relative: 2 %
HCT: 37.7 % (ref 36.0–46.0)
Hemoglobin: 11.5 g/dL — ABNORMAL LOW (ref 12.0–15.0)
Immature Granulocytes: 1 %
Lymphocytes Relative: 16 %
Lymphs Abs: 1.5 10*3/uL (ref 0.7–4.0)
MCH: 26.4 pg (ref 26.0–34.0)
MCHC: 30.5 g/dL (ref 30.0–36.0)
MCV: 86.5 fL (ref 80.0–100.0)
Monocytes Absolute: 0.9 10*3/uL (ref 0.1–1.0)
Monocytes Relative: 10 %
Neutro Abs: 6.9 10*3/uL (ref 1.7–7.7)
Neutrophils Relative %: 70 %
Platelets: 229 10*3/uL (ref 150–400)
RBC: 4.36 MIL/uL (ref 3.87–5.11)
RDW: 15.3 % (ref 11.5–15.5)
WBC: 9.6 10*3/uL (ref 4.0–10.5)
nRBC: 0 % (ref 0.0–0.2)

## 2018-12-11 LAB — SARS CORONAVIRUS 2 BY RT PCR (HOSPITAL ORDER, PERFORMED IN ~~LOC~~ HOSPITAL LAB): SARS Coronavirus 2: NEGATIVE

## 2018-12-11 LAB — I-STAT BETA HCG BLOOD, ED (MC, WL, AP ONLY): I-stat hCG, quantitative: <5 m[IU]/mL (ref <5)

## 2018-12-11 MED ORDER — ALBUTEROL SULFATE HFA 108 (90 BASE) MCG/ACT IN AERS: INHALATION_SPRAY | RESPIRATORY_TRACT | 1 refills | Status: DC

## 2018-12-11 MED ORDER — MAGNESIUM SULFATE 2 GM/50ML IV SOLN
2.0000 g | Freq: Once | INTRAVENOUS | Status: AC
  Administered 2018-12-11: 2 g via INTRAVENOUS
  Filled 2018-12-11: qty 50

## 2018-12-11 MED ORDER — ALBUTEROL SULFATE HFA 108 (90 BASE) MCG/ACT IN AERS
6.0000 | INHALATION_SPRAY | Freq: Once | RESPIRATORY_TRACT | Status: AC
  Administered 2018-12-11: 6 via RESPIRATORY_TRACT

## 2018-12-11 MED ORDER — ALBUTEROL SULFATE HFA 108 (90 BASE) MCG/ACT IN AERS
6.0000 | INHALATION_SPRAY | Freq: Once | RESPIRATORY_TRACT | Status: AC
  Administered 2018-12-11: 01:00:00 6 via RESPIRATORY_TRACT
  Filled 2018-12-11: qty 6.7

## 2018-12-11 MED ORDER — PREDNISONE 10 MG PO TABS: 50.0000 mg | ORAL_TABLET | Freq: Every day | ORAL | 0 refills | Status: DC

## 2018-12-11 MED ORDER — METHYLPREDNISOLONE SODIUM SUCC 125 MG IJ SOLR
125.0000 mg | Freq: Once | INTRAMUSCULAR | Status: AC
  Administered 2018-12-11: 125 mg via INTRAVENOUS
  Filled 2018-12-11: qty 2

## 2018-12-11 NOTE — Discharge Instructions (Addendum)
We saw you in the ER for your asthma related complains. We gave you some breathing treatments in the ER, and seems like your symptoms have improved. Please take albuterol as needed every 4 hours. Please take the medications prescribed. Please refrain from smoking or smoke exposure. Please see a primary care doctor in 1 week. Return to the ER if your symptoms worsen.  

## 2018-12-11 NOTE — ED Provider Notes (Signed)
[facility]-EMERGENCY DEPT Provider Note   CSN: [id number] Arrival date & time: 12/10/18  2321     History   Chief Complaint Chief Complaint  Patient presents with  . Shortness of Breath  . Tachycardia    HPI Arlis PortaDawn Sutherland is a 48 y.o. female.     HPI 48 year old female comes in a chief complaint of shortness of breath and elevated heart rate.  She has history of asthma.  She reports that she started having cough, shortness of breath and wheezing about 2 days ago.  She has taken multiple nebulizer treatment without significant relief.  At baseline she is able to ambulate in her house and go to the mailbox, but now she has difficulty getting around the house.  She has a cough that is mostly nonproductive.  She denies any fevers, chills.  No known sick contacts. She is unsure what triggered her asthma.  Pt has no hx of PE, DVT and denies any exogenous hormone (testosterone / estrogen) use, long distance travels or surgery in the past 6 weeks, active cancer, recent immobilization.   Past Medical History:  Diagnosis Date  . Anxiety   . Asthma   . Depression   . GERD (gastroesophageal reflux disease)   . Heel spur    bilat  . History of bronchitis   . History of chicken pox   . History of urinary tract infection   . Migraines   . Plantar fasciitis    bilat  . STD (sexually transmitted disease)    chl hx & hsv 1&2  . Tremors of nervous system   . Urinary incontinence     Patient Active Problem List   Diagnosis Date Noted  . Not well controlled moderate persistent asthma 02/02/2018  . Chronic rhinitis 02/02/2018  . Status asthmaticus 11/29/2017  . Acute respiratory failure with hypoxia (HCC) 11/29/2017  . Gastroesophageal reflux disease 09/01/2017  . Cough 12/31/2016  . Dysuria 07/28/2016  . Clinical infection 07/28/2016  . History of asthma 04/17/2016  . Acute bronchitis 04/17/2016  . 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 Migraine  without aura and without status migrainosus, not intractable 10/10/2014  . Migraine with aura and without status migrainosus, not intractable 08/29/2014  . 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 classified elsewhere 08/29/2014  . 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 Snorings 07/10/2014  . Sleep related headaches 07/10/2014  . Nocturia more than twice per night 07/10/2014  . Preventative health care 07/30/2012  . Morbid obesity (HCC) 07/30/2012    Past Surgical History:  Procedure Laterality Date  . LAPAROSCOPIC GASTRIC SLEEVE RESECTION N/A 11/27/2014   Procedure: LAPAROSCOPIC GASTRIC SLEEVE RESECTION WITH HIATAL HERNIA REPAIR UPPER ENDOSCOPY;  Surgeon: Luretha MurphyMatthew Martin, MD;  Location: [facility];  Service: General;  Laterality: N/A;  . TONSILLECTOMY  1978     OB History    Gravida  2   Para      Term      Preterm      AB  2   Living  0     SAB      TAB  2   Ectopic      Multiple      Live Births               Home Medications    Prior to Admission medications   Medication Sig Start Date End Date Taking? Authorizing Provider  acetaminophen (TYLENOL) 500 MG tablet Take 1,000 mg by mouth every 6 (  six) hours as needed for mild pain.    [provider]  albuterol (VENTOLIN HFA) 108 (90 Base) MCG/ACT inhaler INHALE 2 PUFFS INTO THE LUNGS EVERY 4 HOURS AS NEEDED FOR WHEEZING OR FOR SHORTNESS OF BREATH 12/11/18   Derwood Kaplan, MD  benzonatate (TESSALON PERLES) 100 MG capsule Take 2 capsules (200 mg total) by mouth 3 (three) times daily as needed for cough. 02/03/18   [name], DO  famotidine (PEPCID) 20 MG tablet Take 20 mg by mouth 2 (two) times daily.    [provider]  ipratropium-albuterol (DUONEB) 0.5-2.5 (3) MG/3ML SOLN Take 3 mLs by nebulization every 6 (six) hours. 12/01/17   Darlin Drop, DO  JUNEL 1/20 1-20 MG-MCG tablet Take 1 tablet by mouth daily. 12/30/17   [provider]  montelukast (SINGULAIR) 10 MG  tablet Take 1 tablet (10 mg total) by mouth at bedtime. 02/02/18   [name], DO  predniSONE (DELTASONE) 10 MG tablet Take 5 tablets (50 mg total) by mouth daily. 12/11/18   Derwood Kaplan, MD  SUMAtriptan (IMITREX) 100 MG tablet ! Tab at onset of HA. May repeat in 2 hours if headache persists or recurs.No more than 4 doses a month. 09/01/17   [name], MD    Family History Family History  Problem Relation Age of Onset  . Diabetes Mother   . Hypertension Mother   . Hyperlipidemia Mother   . Kidney disease Mother   . Cancer Father        pancreatic  . Hypertension Maternal Grandmother   . Diabetes Maternal Grandmother   . Heart failure Maternal Grandmother        CHF  . Heart attack Maternal Grandfather   . Hypertension Maternal Grandfather   . Hypertension Paternal Grandmother   . Alzheimer's disease Paternal Grandmother   . Hypertension Paternal Grandfather   . Cancer Maternal Uncle        melanoma  . Cancer Paternal Uncle        kidney    Social History Social History   Tobacco Use  . Smoking status: Former Smoker    Packs/day: 0.25    Years: 5.00    Pack years: 1.25    Types: Cigarettes    Quit date: 03/10/2012    Years since quitting: 6.7  . Smokeless tobacco: Never Used  Substance Use Topics  . Alcohol use: Yes    Alcohol/week: 0.0 standard drinks    Comment: 1 a month  . Drug use: No     Allergies   Gabapentin   Review of Systems Review of Systems  Constitutional: Positive for activity change.  Respiratory: Positive for cough, shortness of breath and wheezing.   Cardiovascular: Negative for chest pain.  All other systems reviewed and are negative.    Physical Exam Updated Vital Signs BP (!) 158/81   Pulse (!) 103   Temp 98.5 F (36.9 C) (Oral)   Resp 19   SpO2 96%   Physical Exam Vitals signs and nursing note reviewed.  Constitutional:      Appearance: She is well-developed.  HENT:     Head: Normocephalic and atraumatic.   Eyes:     Pupils: Pupils are equal, round, and reactive to light.  Neck:     Musculoskeletal: Neck supple.  Cardiovascular:     Rate and Rhythm: Regular rhythm. Tachycardia present.     Heart sounds: Normal heart sounds. No murmur.  Pulmonary:     Effort: Pulmonary  effort is normal. No respiratory distress.     Breath sounds: Wheezing present. No decreased breath sounds, rhonchi or rales.  Abdominal:     General: There is no distension.     Palpations: Abdomen is soft.     Tenderness: There is no abdominal tenderness. There is no guarding or rebound.  Skin:    General: Skin is warm and dry.  Neurological:     Mental Status: She is alert and oriented to person, place, and time.      ED Treatments / Results  Labs (all labs ordered are listed, but only abnormal results are displayed) Labs Reviewed  BASIC METABOLIC PANEL - Abnormal; Notable for the following components:      Result Value   Glucose, Bld 124 (*)    Calcium 8.7 (*)    All other components within normal limits  CBC WITH DIFFERENTIAL/PLATELET - Abnormal; Notable for the following components:   Hemoglobin 11.5 (*)    All other components within normal limits  SARS CORONAVIRUS 2 (HOSPITAL ORDER, PERFORMED IN [facility])  I-STAT BETA HCG BLOOD, ED ([facility], [facility], [facility] ONLY)    EKG EKG Interpretation  Date/Time:  Friday December 10 2018 23:34:17 EDT Ventricular Rate:  111 PR Interval:    QRS Duration: 88 QT Interval:  327 QTC Calculation: 445 R Axis:   21 Text Interpretation:  Sinus tachycardia Anterior infarct, old No acute changes besides tachycardia no sichemic changes Confirmed by Derwood Kaplan [phone]) on 12/10/2018 11:49:25 PM   Radiology Dg Chest 2 View  Result Date: 12/11/2018 CLINICAL DATA:  Shortness of breath EXAM: CHEST - 2 VIEW COMPARISON:  11/29/2017 FINDINGS: The heart size and mediastinal contours are within normal limits. Both lungs are clear. The visualized skeletal structures are  unremarkable. IMPRESSION: No active cardiopulmonary disease. Electronically Signed   By: Jasmine Pang M.D.   On: 12/11/2018 00:10    Procedures Procedures (including critical care time)  Medications Ordered in ED Medications  magnesium sulfate IVPB 2 g 50 mL (0 g Intravenous Stopped 12/11/18 0222)  albuterol (VENTOLIN HFA) 108 (90 Base) MCG/ACT inhaler 6 puff (6 puffs Inhalation Given 12/11/18 0051)  methylPREDNISolone sodium succinate (SOLU-MEDROL) 125 mg/2 mL injection 125 mg (125 mg Intravenous Given 12/11/18 0226)  albuterol (VENTOLIN HFA) 108 (90 Base) MCG/ACT inhaler 6 puff (6 puffs Inhalation Given 12/11/18 0255)     Initial Impression / Assessment and Plan / ED Course  I have reviewed the triage vital signs and the nursing notes.  Pertinent labs & imaging results that were available during my care of the patient were reviewed by me and considered in my medical decision making (see chart for details).        48 year old comes in a chief complaint of cough, shortness of breath and wheezing.  She has history of asthma. She is also noted to be tachycardic and hypertensive.  No PE risk factors.   Differential diagnosis includes pulmonary edema because of hypertensive emergency, pneumonia, asthma exacerbation, PE.  No direct COVID-19 contacts per patient, however it too is in the differential diagnosis as the driver for the asthma exacerbation.  Gissella Sarno was evaluated in Emergency Department on 12/11/2018 for the symptoms described in the history of present illness. She was evaluated in the context of the global COVID-19 pandemic, which necessitated consideration that the patient might be at risk for infection with the SARS-CoV-2 virus that causes COVID-19. Institutional protocols and algorithms that pertain to the evaluation of patients at risk  for COVID-19 are in a state of rapid change based on information released by regulatory bodies including the CDC and federal and state  organizations. These policies and algorithms were followed during the patient's care in the ED.  Reassessment: Repeat exam reveals clearing of wheezing in all lung fields. Patient is not in any respiratory distress nor is there hypoxia.  Strict ER return precautions have been discussed, and patient is agreeing with the plan and is comfortable with the workup done and the recommendations from the ER.   Final Clinical Impressions(s) / ED Diagnoses   Final diagnoses:  Moderate persistent asthma with acute exacerbation    ED Discharge Orders         Ordered    predniSONE (DELTASONE) 10 MG tablet  Daily     12/11/18 0239    albuterol (VENTOLIN HFA) 108 (90 Base) MCG/ACT inhaler    Note to Pharmacy: Needs OV for further refills   12/11/18 0239           Varney Biles, MD 12/11/18 [id number]

## 2019-04-10 ENCOUNTER — Emergency Department (HOSPITAL_COMMUNITY)
Admission: EM | Admit: 2019-04-10 | Discharge: 2019-04-10 | Disposition: A | Discharge status: Home / Self Care | Payer: BLUE CROSS/BLUE SHIELD | Attending: Emergency Medicine | Admitting: Emergency Medicine

## 2019-04-10 ENCOUNTER — Encounter (HOSPITAL_COMMUNITY): Payer: Self-pay | Admitting: Emergency Medicine

## 2019-04-10 ENCOUNTER — Other Ambulatory Visit: Payer: Self-pay

## 2019-04-10 DIAGNOSIS — Z793 Long term (current) use of hormonal contraceptives: Secondary | ICD-10-CM | POA: Insufficient documentation

## 2019-04-10 DIAGNOSIS — Z79899 Other long term (current) drug therapy: Secondary | ICD-10-CM | POA: Diagnosis not present

## 2019-04-10 DIAGNOSIS — J45909 Unspecified asthma, uncomplicated: Secondary | ICD-10-CM | POA: Insufficient documentation

## 2019-04-10 DIAGNOSIS — Z87891 Personal history of nicotine dependence: Secondary | ICD-10-CM | POA: Diagnosis not present

## 2019-04-10 DIAGNOSIS — G4700 Insomnia, unspecified: Secondary | ICD-10-CM | POA: Insufficient documentation

## 2019-04-10 MED ORDER — LORAZEPAM 1 MG PO TABS
1.0000 mg | ORAL_TABLET | Freq: Once | ORAL | Status: AC
  Administered 2019-04-10: 1 mg via ORAL
  Filled 2019-04-10: qty 1

## 2019-04-10 NOTE — ED Provider Notes (Signed)
[facility] DEPT Provider Note   CSN: [id number] Arrival date & time: 04/10/19  1132     History Chief Complaint  Patient presents with  . Insomnia    Alyssa Blanchard is a 49 y.o. female.  The history is provided by the patient.  Illness Location:  General Severity:  Mild Onset quality:  Gradual Timing:  Constant Progression:  Unchanged Chronicity:  New Context:  Patient here for insomnia. No si/hi. Relieved by:  Nothing Worsened by:  Anxiety Ineffective treatments:  Benadryl, melatonin, vistaril.  Associated symptoms: no abdominal pain, no chest pain, no cough, no ear pain, no fatigue, no fever, no rash, no shortness of breath, no sore throat and no vomiting        Past Medical History:  Diagnosis Date  . Anxiety   . Asthma   . Depression   . GERD (gastroesophageal reflux disease)   . Heel spur    bilat  . History of bronchitis   . History of chicken pox   . History of urinary tract infection   . Migraines   . Plantar fasciitis    bilat  . STD (sexually transmitted disease)    chl hx & hsv 1&2  . Tremors of nervous system   . Urinary incontinence     Patient Active Problem List   Diagnosis Date Noted  . Not well controlled moderate persistent asthma 02/02/2018  . Chronic rhinitis 02/02/2018  . Status asthmaticus 11/29/2017  . Acute respiratory failure with hypoxia ([place]) 11/29/2017  . Gastroesophageal reflux disease 09/01/2017  . Cough 12/31/2016  . Dysuria 07/28/2016  . Clinical infection 07/28/2016  . History of asthma 04/17/2016  . Acute bronchitis 04/17/2016  . 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 Migraine without aura and without status migrainosus, not intractable 10/10/2014  . Migraine with aura and without status migrainosus, not intractable 08/29/2014  . 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 classified elsewhere 08/29/2014  . Hypoventilation associated with obesity syndrome ([place]) 07/10/2014  . Snorings  07/10/2014  . Sleep related headaches 07/10/2014  . Nocturia more than twice per night 07/10/2014  . Preventative health care 07/30/2012  . Morbid obesity ([place]) 07/30/2012    Past Surgical History:  Procedure Laterality Date  . LAPAROSCOPIC GASTRIC SLEEVE RESECTION N/A 11/27/2014   Procedure: LAPAROSCOPIC GASTRIC SLEEVE RESECTION WITH HIATAL HERNIA REPAIR UPPER ENDOSCOPY;  Surgeon: Johnathan Hausen, MD;  Location: [facility]S;  Service: General;  Laterality: N/A;  . TONSILLECTOMY  1978     OB History    Gravida  2   Para      Term      Preterm      AB  2   Living  0     SAB      TAB  2   Ectopic      Multiple      Live Births              Family History  Problem Relation Age of Onset  . Diabetes Mother   . Hypertension Mother   . Hyperlipidemia Mother   . Kidney disease Mother   . Cancer Father        pancreatic  . Hypertension Maternal Grandmother   . Diabetes Maternal Grandmother   . Heart failure Maternal Grandmother        CHF  . Heart attack Maternal Grandfather   . Hypertension Maternal Grandfather   . Hypertension Paternal Grandmother   . Alzheimer's disease Paternal Grandmother   .  Hypertension Paternal Grandfather   . Cancer Maternal Uncle        melanoma  . Cancer Paternal Uncle        kidney    Social History   Tobacco Use  . Smoking status: Former Smoker    Packs/day: 0.25    Years: 5.00    Pack years: 1.25    Types: Cigarettes    Quit date: 03/10/2012    Years since quitting: 7.0  . Smokeless tobacco: Never Used  Substance Use Topics  . Alcohol use: Yes    Alcohol/week: 0.0 standard drinks    Comment: 1 a month  . Drug use: No    Home Medications Prior to Admission medications   Medication Sig Start Date End Date Taking? Authorizing Provider  acetaminophen (TYLENOL) 500 MG tablet Take 1,000 mg by mouth every 6 (six) hours as needed for mild pain.    [provider]  albuterol (VENTOLIN HFA) 108 (90 Base) MCG/ACT  inhaler INHALE 2 PUFFS INTO THE LUNGS EVERY 4 HOURS AS NEEDED FOR WHEEZING OR FOR SHORTNESS OF BREATH 12/11/18   Derwood Kaplan, MD  benzonatate (TESSALON PERLES) 100 MG capsule Take 2 capsules (200 mg total) by mouth 3 (three) times daily as needed for cough. 02/03/18   [name], DO  famotidine (PEPCID) 20 MG tablet Take 20 mg by mouth 2 (two) times daily.    [provider]  ipratropium-albuterol (DUONEB) 0.5-2.5 (3) MG/3ML SOLN Take 3 mLs by nebulization every 6 (six) hours. 12/01/17   Darlin Drop, DO  JUNEL 1/20 1-20 MG-MCG tablet Take 1 tablet by mouth daily. 12/30/17   [provider]  montelukast (SINGULAIR) 10 MG tablet Take 1 tablet (10 mg total) by mouth at bedtime. 02/02/18   [name], DO  predniSONE (DELTASONE) 10 MG tablet Take 5 tablets (50 mg total) by mouth daily. 12/11/18   Derwood Kaplan, MD  SUMAtriptan (IMITREX) 100 MG tablet ! Tab at onset of HA. May repeat in 2 hours if headache persists or recurs.No more than 4 doses a month. 09/01/17   [name], MD    Allergies    Gabapentin  Review of Systems   Review of Systems  Constitutional: Negative for chills, fatigue and fever.  HENT: Negative for ear pain and sore throat.   Eyes: Negative for pain and visual disturbance.  Respiratory: Negative for cough and shortness of breath.   Cardiovascular: Negative for chest pain and palpitations.  Gastrointestinal: Negative for abdominal pain and vomiting.  Genitourinary: Negative for dysuria and hematuria.  Musculoskeletal: Negative for arthralgias and back pain.  Skin: Negative for color change and rash.  Neurological: Negative for seizures and syncope.  Psychiatric/Behavioral: Positive for decreased concentration and sleep disturbance. Negative for suicidal ideas. The patient is nervous/anxious.   All other systems reviewed and are negative.   Physical Exam Updated Vital Signs BP (!) 162/115 (BP Location: Left Arm)   Pulse (!) 129   Temp  98.3 F (36.8 C) (Oral)   Resp (!) 22   SpO2 98%   Physical Exam Vitals and nursing note reviewed.  Constitutional:      General: She is not in acute distress.    Appearance: She is well-developed.  HENT:     Head: Normocephalic and atraumatic.     Mouth/Throat:     Mouth: Mucous membranes are dry.  Eyes:     Extraocular Movements: Extraocular movements intact.     Conjunctiva/sclera: Conjunctivae normal.  Pupils: Pupils are equal, round, and reactive to light.  Cardiovascular:     Rate and Rhythm: Normal rate and regular rhythm.     Heart sounds: No murmur.  Pulmonary:     Effort: Pulmonary effort is normal. No respiratory distress.     Breath sounds: Normal breath sounds.  Abdominal:     Palpations: Abdomen is soft.     Tenderness: There is no abdominal tenderness.  Musculoskeletal:     Cervical back: Neck supple.  Skin:    General: Skin is warm and dry.  Neurological:     Mental Status: She is alert.  Psychiatric:        Mood and Affect: Mood is anxious.        Behavior: Behavior normal.        Thought Content: Thought content normal.        Judgment: Judgment normal.     ED Results / Procedures / Treatments   Labs (all labs ordered are listed, but only abnormal results are displayed) Labs Reviewed - No data to display  EKG None  Radiology No results found.  Procedures Procedures (including critical care time)  Medications Ordered in ED Medications  LORazepam (ATIVAN) tablet 1 mg (1 mg Oral Given 04/10/19 1216)    ED Course  I have reviewed the triage vital signs and the nursing notes.  Pertinent labs & imaging results that were available during my care of the patient were reviewed by me and considered in my medical decision making (see chart for details).    MDM Rules/Calculators/A&P  Hamda Klutts is a 49 year old female with history of depression, asthma, anxiety who presents to the ED with difficulty with sleeping.  Patient tachycardic but  otherwise unremarkable vitals.  Patient anxious and having hard time with sleep.  Has been seen by psychiatry for this issue.  Takes BuSpar, Benadryl, Vistaril, melatonin but has not helped sleep.  She denies any suicidal homicidal ideation.  Patient overall appears well.  She is frustrated about her sleep.  Recommended that she discontinue Benadryl.  Recommend that she continue melatonin.  Given a dose of Ativan here in the ED for acute anxiety.  Encourage close follow-up with her psychiatrist to discuss further treatment options including trazodone, Ambien.  Encouraged her to avoid alcohol or drugs.  Given return precautions and discharged from the ED in good condition.  This chart was dictated using voice recognition software.  Despite best efforts to proofread,  errors can occur which can change the documentation meaning.    Final Clinical Impression(s) / ED Diagnoses Final diagnoses:  Insomnia, unspecified type    Rx / DC Orders ED Discharge Orders    None       Virgina Norfolk, DO 04/10/19 1232

## 2019-04-10 NOTE — ED Triage Notes (Signed)
Patient here from home with complaints of insomnia since the death of her boyfriend. Reports being seen by psychiatrist and being prescribed Trileptal. States that she has been taking the meds 2 days and "are not working".

## 2019-04-10 NOTE — Discharge Instructions (Addendum)
Continue Vistaril, melatonin.  Discontinue Benadryl.  Follow-up with her psychiatrist.

## 2019-04-13 ENCOUNTER — Encounter (HOSPITAL_COMMUNITY): Payer: Self-pay | Admitting: Emergency Medicine

## 2019-04-13 ENCOUNTER — Emergency Department (HOSPITAL_COMMUNITY)
Admission: EM | Admit: 2019-04-13 | Discharge: 2019-04-14 | Disposition: A | Discharge status: Other Acute Inpatient Hospital | Payer: BLUE CROSS/BLUE SHIELD | Source: Home / Self Care | Attending: Emergency Medicine | Admitting: Emergency Medicine

## 2019-04-13 ENCOUNTER — Other Ambulatory Visit: Payer: Self-pay

## 2019-04-13 DIAGNOSIS — Z20822 Contact with and (suspected) exposure to covid-19: Secondary | ICD-10-CM | POA: Insufficient documentation

## 2019-04-13 DIAGNOSIS — J45909 Unspecified asthma, uncomplicated: Secondary | ICD-10-CM | POA: Insufficient documentation

## 2019-04-13 DIAGNOSIS — F329 Major depressive disorder, single episode, unspecified: Secondary | ICD-10-CM | POA: Insufficient documentation

## 2019-04-13 DIAGNOSIS — F29 Unspecified psychosis not due to a substance or known physiological condition: Secondary | ICD-10-CM | POA: Insufficient documentation

## 2019-04-13 DIAGNOSIS — F312 Bipolar disorder, current episode manic severe with psychotic features: Secondary | ICD-10-CM | POA: Diagnosis not present

## 2019-04-13 DIAGNOSIS — Z87891 Personal history of nicotine dependence: Secondary | ICD-10-CM | POA: Insufficient documentation

## 2019-04-13 DIAGNOSIS — Z79899 Other long term (current) drug therapy: Secondary | ICD-10-CM | POA: Insufficient documentation

## 2019-04-13 DIAGNOSIS — F23 Brief psychotic disorder: Secondary | ICD-10-CM

## 2019-04-13 LAB — COMPREHENSIVE METABOLIC PANEL
ALT: 28 U/L (ref 0–44)
AST: 31 U/L (ref 15–41)
Albumin: 4.3 g/dL (ref 3.5–5.0)
Alkaline Phosphatase: 70 U/L (ref 38–126)
Anion gap: 15 (ref 5–15)
BUN: 14 mg/dL (ref 6–20)
CO2: 19 mmol/L — ABNORMAL LOW (ref 22–32)
Calcium: 9 mg/dL (ref 8.9–10.3)
Chloride: 103 mmol/L (ref 98–111)
Creatinine, Ser: 0.97 mg/dL (ref 0.44–1.00)
GFR calc Af Amer: >60 mL/min (ref >60)
GFR calc non Af Amer: >60 mL/min (ref >60)
Glucose, Bld: 132 mg/dL — ABNORMAL HIGH (ref 70–99)
Potassium: 3 mmol/L — ABNORMAL LOW (ref 3.5–5.1)
Sodium: 137 mmol/L (ref 135–145)
Total Bilirubin: 1.2 mg/dL (ref 0.3–1.2)
Total Protein: 8.1 g/dL (ref 6.5–8.1)

## 2019-04-13 LAB — CBC
HCT: 41.8 % (ref 36.0–46.0)
Hemoglobin: 12.7 g/dL (ref 12.0–15.0)
MCH: 25.2 pg — ABNORMAL LOW (ref 26.0–34.0)
MCHC: 30.4 g/dL (ref 30.0–36.0)
MCV: 83.1 fL (ref 80.0–100.0)
Platelets: 350 10*3/uL (ref 150–400)
RBC: 5.03 MIL/uL (ref 3.87–5.11)
RDW: 16 % — ABNORMAL HIGH (ref 11.5–15.5)
WBC: 9.8 10*3/uL (ref 4.0–10.5)
nRBC: 0 % (ref 0.0–0.2)

## 2019-04-13 LAB — I-STAT BETA HCG BLOOD, ED (MC, WL, AP ONLY): I-stat hCG, quantitative: <5 m[IU]/mL (ref <5)

## 2019-04-13 LAB — ETHANOL: Alcohol, Ethyl (B): <10 mg/dL (ref <10)

## 2019-04-13 LAB — ACETAMINOPHEN LEVEL: Acetaminophen (Tylenol), Serum: <10 ug/mL — ABNORMAL LOW (ref 10–30)

## 2019-04-13 LAB — SALICYLATE LEVEL: Salicylate Lvl: <7 mg/dL — ABNORMAL LOW (ref 7.0–30.0)

## 2019-04-13 MED ORDER — ZIPRASIDONE MESYLATE 20 MG IM SOLR
INTRAMUSCULAR | Status: AC
  Administered 2019-04-13: 20 mg via INTRAMUSCULAR
  Filled 2019-04-13: qty 20

## 2019-04-13 MED ORDER — STERILE WATER FOR INJECTION IJ SOLN
INTRAMUSCULAR | Status: AC
  Administered 2019-04-13: 22:00:00 2.1 mL
  Filled 2019-04-13: qty 10

## 2019-04-13 MED ORDER — ZIPRASIDONE MESYLATE 20 MG IM SOLR: 20.0000 mg | Freq: Once | INTRAMUSCULAR | Status: AC

## 2019-04-13 MED ORDER — ZIPRASIDONE MESYLATE 20 MG IM SOLR: 10.0000 mg | Freq: Once | INTRAMUSCULAR | Status: DC

## 2019-04-13 NOTE — ED Provider Notes (Addendum)
[facility]-EMERGENCY DEPT Provider Note   CSN: [id number] Arrival date & time: 04/13/19  2044     History Chief Complaint  Patient presents with  . Hallucinations    Alyssa Blanchard is a 49 y.o. female.  Patient is a 49 year old female with history of anxiety, depression, migraines.  She was brought by EMS for evaluation of erratic behavior.  Patient was apparently found in a hotel wandering around looking for her husband.  She then began telling people that someone was out to kill her.  She then began caring on erratically ranting about Satan and angels.  She arrived here extremely irrational, hyperventilating, and acutely psychotic.  She has little additional history secondary to her psychiatric illness.  The history is provided by the patient and the EMS personnel.       Past Medical History:  Diagnosis Date  . Anxiety   . Asthma   . Depression   . GERD (gastroesophageal reflux disease)   . Heel spur    bilat  . History of bronchitis   . History of chicken pox   . History of urinary tract infection   . Migraines   . Plantar fasciitis    bilat  . STD (sexually transmitted disease)    chl hx & hsv 1&2  . Tremors of nervous system   . Urinary incontinence     Patient Active Problem List   Diagnosis Date Noted  . Not well controlled moderate persistent asthma 02/02/2018  . Chronic rhinitis 02/02/2018  . Status asthmaticus 11/29/2017  . Acute respiratory failure with hypoxia (HCC) 11/29/2017  . Gastroesophageal reflux disease 09/01/2017  . Cough 12/31/2016  . Dysuria 07/28/2016  . Clinical infection 07/28/2016  . History of asthma 04/17/2016  . Acute bronchitis 04/17/2016  . 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 Migraine without aura and without status migrainosus, not intractable 10/10/2014  . Migraine with aura and without status migrainosus, not intractable 08/29/2014  . 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 classified elsewhere 08/29/2014    . 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 Snorings 07/10/2014  . Sleep related headaches 07/10/2014  . Nocturia more than twice per night 07/10/2014  . Preventative health care 07/30/2012  . Morbid obesity (HCC) 07/30/2012    Past Surgical History:  Procedure Laterality Date  . LAPAROSCOPIC GASTRIC SLEEVE RESECTION N/A 11/27/2014   Procedure: LAPAROSCOPIC GASTRIC SLEEVE RESECTION WITH HIATAL HERNIA REPAIR UPPER ENDOSCOPY;  Surgeon: Luretha Murphy, MD;  Location: [facility]S;  Service: General;  Laterality: N/A;  . TONSILLECTOMY  1978     OB History    Gravida  2   Para      Term      Preterm      AB  2   Living  0     SAB      TAB  2   Ectopic      Multiple      Live Births              Family History  Problem Relation Age of Onset  . Diabetes Mother   . Hypertension Mother   . Hyperlipidemia Mother   . Kidney disease Mother   . Cancer Father        pancreatic  . Hypertension Maternal Grandmother   . Diabetes Maternal Grandmother   . Heart failure Maternal Grandmother        CHF  . Heart attack Maternal Grandfather   . Hypertension Maternal Grandfather   .  Hypertension Paternal Grandmother   . Alzheimer's disease Paternal Grandmother   . Hypertension Paternal Grandfather   . Cancer Maternal Uncle        melanoma  . Cancer Paternal Uncle        kidney    Social History   Tobacco Use  . Smoking status: Former Smoker    Packs/day: 0.25    Years: 5.00    Pack years: 1.25    Types: Cigarettes    Quit date: 03/10/2012    Years since quitting: 7.0  . Smokeless tobacco: Never Used  Substance Use Topics  . Alcohol use: Yes    Alcohol/week: 0.0 standard drinks    Comment: 1 a month  . Drug use: No    Home Medications Prior to Admission medications   Medication Sig Start Date End Date Taking? Authorizing Provider  albuterol (VENTOLIN HFA) 108 (90 Base) MCG/ACT inhaler INHALE 2 PUFFS INTO THE LUNGS EVERY 4 HOURS AS  NEEDED FOR WHEEZING OR FOR SHORTNESS OF BREATH Patient not taking: Reported on 04/13/2019 12/11/18   Derwood Kaplan, MD  benzonatate (TESSALON PERLES) 100 MG capsule Take 2 capsules (200 mg total) by mouth 3 (three) times daily as needed for cough. Patient not taking: Reported on 04/13/2019 02/03/18   [name], DO  ipratropium-albuterol (DUONEB) 0.5-2.5 (3) MG/3ML SOLN Take 3 mLs by nebulization every 6 (six) hours. Patient not taking: Reported on 04/13/2019 12/01/17   Darlin Drop, DO  montelukast (SINGULAIR) 10 MG tablet Take 1 tablet (10 mg total) by mouth at bedtime. Patient not taking: Reported on 04/13/2019 02/02/18   [name], DO  predniSONE (DELTASONE) 10 MG tablet Take 5 tablets (50 mg total) by mouth daily. Patient not taking: Reported on 04/13/2019 12/11/18   Derwood Kaplan, MD  SUMAtriptan (IMITREX) 100 MG tablet ! Tab at onset of HA. May repeat in 2 hours if headache persists or recurs.No more than 4 doses a month. Patient not taking: Reported on 04/13/2019 09/01/17   [name], MD    Allergies    Gabapentin, Topiramate, Montelukast, and Other  Review of Systems   Review of Systems  Unable to perform ROS: Psychiatric disorder    Physical Exam Updated Vital Signs BP (!) 159/78   Pulse (!) 107   Temp 98.9 F (37.2 C) (Oral)   Resp 16   Ht 5\' 7"  (1.702 m)   Wt (!) 165.1 kg   SpO2 99%   BMI 57.01 kg/m   Physical Exam Vitals and nursing note reviewed.  Constitutional:      General: She is not in acute distress.    Appearance: She is well-developed. She is not diaphoretic.  HENT:     Head: Normocephalic and atraumatic.  Cardiovascular:     Rate and Rhythm: Normal rate and regular rhythm.     Heart sounds: No murmur. No friction rub. No gallop.   Pulmonary:     Effort: Pulmonary effort is normal. No respiratory distress.     Breath sounds: Normal breath sounds. No wheezing.  Abdominal:     General: Bowel sounds are normal. There is no distension.      Palpations: Abdomen is soft.     Tenderness: There is no abdominal tenderness.  Musculoskeletal:        General: Normal range of motion.     Cervical back: Normal range of motion and neck supple.  Skin:    General: Skin is warm and dry.  Neurological:  Mental Status: She is alert and oriented to person, place, and time.  Psychiatric:        Mood and Affect: Affect is inappropriate.        Speech: Speech is rapid and pressured and tangential.        Behavior: Behavior is agitated, aggressive and hyperactive.     ED Results / Procedures / Treatments   Labs (all labs ordered are listed, but only abnormal results are displayed) Labs Reviewed  COMPREHENSIVE METABOLIC PANEL  ETHANOL  SALICYLATE LEVEL  ACETAMINOPHEN LEVEL  CBC  RAPID URINE DRUG SCREEN, HOSP PERFORMED  I-STAT BETA HCG BLOOD, ED ([facility], [facility], [facility] ONLY)  TROPONIN I (HIGH SENSITIVITY)    EKG None  Radiology No results found.  Procedures Procedures (including critical care time)  Medications Ordered in ED Medications  ziprasidone (GEODON) injection 20 mg (has no administration in time range)  sterile water (preservative free) injection (2.1 mLs  Given 04/13/19 2219)    ED Course  I have reviewed the triage vital signs and the nursing notes.  Pertinent labs & imaging results that were available during my care of the patient were reviewed by me and considered in my medical decision making (see chart for details).    MDM Rules/Calculators/A&P  Patient brought by EMS for evaluation of an acute psychotic episode.  Patient apparently behaving erratically at a hotel.  When I attempted to evaluate her, she was screaming, hyperventilating, and stating that she "needed Edmonia Lynch out of her room".  She told me that Edmonia Lynch was trying to harm her.  Patient screaming and yelling and combative to the point she required chemical restraint with Geodon.  I believe she represented a threat to herself, other patients, and the healthcare  workers attempting to care for her.  This was administered with good results.  I initiated an IVC as the patient is clearly psychotic and not of her right mind.  I also feel as though she is a danger to herself and her current state of mind as well as a danger to others.  Laboratory studies are currently pending.  Patient will be evaluated by TTS once medically cleared and they will assist in the final disposition.  CRITICAL CARE Performed by: Veryl Speak Total critical care time: 35 minutes Critical care time was exclusive of separately billable procedures and treating other patients. Critical care was necessary to treat or prevent imminent or life-threatening deterioration. Critical care was time spent personally by me on the following activities: development of treatment plan with patient and/or surrogate as well as nursing, discussions with consultants, evaluation of patient's response to treatment, examination of patient, obtaining history from patient or surrogate, ordering and performing treatments and interventions, ordering and review of laboratory studies, ordering and review of radiographic studies, pulse oximetry and re-evaluation of patient's condition.   Final Clinical Impression(s) / ED Diagnoses Final diagnoses:  None    Rx / DC Orders ED Discharge Orders    None       Veryl Speak, MD 04/13/19 Audria Nine    Veryl Speak, MD 04/13/19 2317

## 2019-04-13 NOTE — ED Notes (Signed)
Pt attempted to run from ED Security was able to redirect pt back to Triage 6 Security remains in triage to watch pt

## 2019-04-13 NOTE — ED Triage Notes (Signed)
Patient was found in the [place] hotel looking for her husband who was not there. The staff called EMS due to aggressive behavior from the patient and the patient saying someone was going to kill her. They found ETOH near her car in the hotel parking lot. Patient reported to EMS she was told she has a mood disorder but does not believe it. She has not been taking her medication. Early today the patient had a fight with her mother. In the past her mother tried to have her IVC'd. EMS reports the patient told them she was following a light and that an angel led her to the hotel. While driving she believes cars were trying to run her off the road and the people around her would look at her as if they hated her. EMS reports a flight of ideas. The patient reports not sleeping in 7 days because she doesn't want the dark to touch her. She would normally take trazodone, solumedrol and Wellbutrin.    EMS vitals: 176/94 BP 110 HR 131CBG 98.1 Temp.

## 2019-04-13 NOTE — ED Notes (Signed)
Pt upset and crying Pt screaming that she needs her "protector [name]". Pt asking that staff pray for her to "get [name] out of the room". This Clinical research associate and Joanna/RN prayed with the pt to help calm her down EDP Delo in to see pt and gave Joanna/RN verbal orders for medication This writer will attempt labs and EKG once pt has calmed down

## 2019-04-14 ENCOUNTER — Inpatient Hospital Stay (HOSPITAL_COMMUNITY)
Admission: AD | Admit: 2019-04-14 | Discharge: 2019-04-20 | DRG: 885 | Disposition: A | Discharge status: Home / Self Care | Payer: BLUE CROSS/BLUE SHIELD | Source: Intra-hospital | Attending: Psychiatry | Admitting: Psychiatry

## 2019-04-14 DIAGNOSIS — Z6841 Body Mass Index (BMI) 40.0 and over, adult: Secondary | ICD-10-CM | POA: Diagnosis not present

## 2019-04-14 DIAGNOSIS — Z87891 Personal history of nicotine dependence: Secondary | ICD-10-CM | POA: Diagnosis not present

## 2019-04-14 DIAGNOSIS — Z888 Allergy status to other drugs, medicaments and biological substances status: Secondary | ICD-10-CM | POA: Diagnosis not present

## 2019-04-14 DIAGNOSIS — Z820 Family history of epilepsy and other diseases of the nervous system: Secondary | ICD-10-CM

## 2019-04-14 DIAGNOSIS — J45909 Unspecified asthma, uncomplicated: Secondary | ICD-10-CM | POA: Diagnosis present

## 2019-04-14 DIAGNOSIS — Z8349 Family history of other endocrine, nutritional and metabolic diseases: Secondary | ICD-10-CM

## 2019-04-14 DIAGNOSIS — F3110 Bipolar disorder, current episode manic without psychotic features, unspecified: Secondary | ICD-10-CM

## 2019-04-14 DIAGNOSIS — Z841 Family history of disorders of kidney and ureter: Secondary | ICD-10-CM | POA: Diagnosis not present

## 2019-04-14 DIAGNOSIS — F29 Unspecified psychosis not due to a substance or known physiological condition: Secondary | ICD-10-CM | POA: Diagnosis present

## 2019-04-14 DIAGNOSIS — Z833 Family history of diabetes mellitus: Secondary | ICD-10-CM

## 2019-04-14 DIAGNOSIS — F312 Bipolar disorder, current episode manic severe with psychotic features: Principal | ICD-10-CM | POA: Diagnosis present

## 2019-04-14 DIAGNOSIS — F3174 Bipolar disorder, in full remission, most recent episode manic: Secondary | ICD-10-CM

## 2019-04-14 DIAGNOSIS — Z8249 Family history of ischemic heart disease and other diseases of the circulatory system: Secondary | ICD-10-CM | POA: Diagnosis not present

## 2019-04-14 LAB — TROPONIN I (HIGH SENSITIVITY): Troponin I (High Sensitivity): 10 ng/L (ref <18)

## 2019-04-14 LAB — RESPIRATORY PANEL BY RT PCR (FLU A&B, COVID)
Influenza A by PCR: NEGATIVE
Influenza B by PCR: NEGATIVE
SARS Coronavirus 2 by RT PCR: NEGATIVE

## 2019-04-14 MED ORDER — MONTELUKAST SODIUM 10 MG PO TABS
10.0000 mg | ORAL_TABLET | Freq: Every day | ORAL | Status: DC
  Filled 2019-04-14 (×2): qty 1

## 2019-04-14 MED ORDER — LORAZEPAM 2 MG/ML IJ SOLN
2.0000 mg | Freq: Once | INTRAMUSCULAR | Status: AC
  Administered 2019-04-14: 2 mg via INTRAMUSCULAR
  Filled 2019-04-14: qty 1

## 2019-04-14 MED ORDER — IPRATROPIUM-ALBUTEROL 0.5-2.5 (3) MG/3ML IN SOLN
3.0000 mL | Freq: Four times a day (QID) | RESPIRATORY_TRACT | Status: DC
  Administered 2019-04-17 – 2019-04-19 (×3): 3 mL via RESPIRATORY_TRACT
  Filled 2019-04-14 (×30): qty 3

## 2019-04-14 MED ORDER — SALINE SPRAY 0.65 % NA SOLN
1.0000 | NASAL | Status: DC | PRN
  Administered 2019-04-14 – 2019-04-19 (×6): 1 via NASAL
  Filled 2019-04-14 (×2): qty 44

## 2019-04-14 MED ORDER — TRAZODONE HCL 50 MG PO TABS: 50.0000 mg | ORAL_TABLET | Freq: Every evening | ORAL | Status: DC | PRN

## 2019-04-14 MED ORDER — HYDROXYZINE HCL 25 MG PO TABS: 25.0000 mg | ORAL_TABLET | Freq: Three times a day (TID) | ORAL | Status: DC | PRN

## 2019-04-14 MED ORDER — HALOPERIDOL LACTATE 5 MG/ML IJ SOLN
5.0000 mg | Freq: Once | INTRAMUSCULAR | Status: AC
  Administered 2019-04-14: 5 mg via INTRAMUSCULAR
  Filled 2019-04-14: qty 1

## 2019-04-14 MED ORDER — TRAZODONE HCL 100 MG PO TABS
100.0000 mg | ORAL_TABLET | Freq: Every evening | ORAL | Status: DC | PRN
  Administered 2019-04-14 – 2019-04-17 (×4): 100 mg via ORAL
  Filled 2019-04-14 (×5): qty 1

## 2019-04-14 MED ORDER — ALBUTEROL SULFATE HFA 108 (90 BASE) MCG/ACT IN AERS: 1.0000 | INHALATION_SPRAY | Freq: Four times a day (QID) | RESPIRATORY_TRACT | Status: DC | PRN

## 2019-04-14 MED ORDER — ACETAMINOPHEN 325 MG PO TABS
650.0000 mg | ORAL_TABLET | Freq: Four times a day (QID) | ORAL | Status: DC | PRN
  Administered 2019-04-15 – 2019-04-18 (×6): 650 mg via ORAL
  Filled 2019-04-14 (×6): qty 2

## 2019-04-14 MED ORDER — ALUM & MAG HYDROXIDE-SIMETH 200-200-20 MG/5ML PO SUSP: 30.0000 mL | ORAL | Status: DC | PRN

## 2019-04-14 MED ORDER — MAGNESIUM HYDROXIDE 400 MG/5ML PO SUSP: 30.0000 mL | Freq: Every day | ORAL | Status: DC | PRN

## 2019-04-14 MED ORDER — POTASSIUM CHLORIDE CRYS ER 20 MEQ PO TBCR: 20.0000 meq | EXTENDED_RELEASE_TABLET | Freq: Every day | ORAL | Status: DC

## 2019-04-14 NOTE — Progress Notes (Signed)
Pt admitted to the unit pt stated she has bad reaction to antihistamines , pt stated the regular albuterol makes her shake badly. Pt stated the medications listed below are what she takes and were copied from her stay at [facility] .           fluticasone furoate-vilanteroL (BREO ELLIPTA) 200-25 mcg/dose inhaler  Indications: Moderate persistent asthma, unspecified whether complicated Inhale 1 puff into the lungs daily. 1 Inhaler    pantoprazole (PROTONIX) 20 MG tablet  Indications: Gastroesophageal reflux disease without esophagitis Take 2 tablets (40 mg total) by mouth daily for 30 days.   levalbuterol [facility] HFA) 45 mcg/actuation inhaler  Indications: Moderate persistent asthma, unspecified whether complicated, Viral URI with cough Inhale 1-2 puffs into the lungs every 8 (eight) hours as needed for Wheezing. 1 Inhaler

## 2019-04-14 NOTE — ED Notes (Signed)
Up moving around in room.  Organizing things at over bed table.  Occasional pacing.

## 2019-04-14 NOTE — ED Notes (Signed)
Pt now sleeping

## 2019-04-14 NOTE — BH Assessment (Signed)
[facility] Assessment Progress Note  Per Berneice Heinrich, FNP, this pt requires psychiatric hospitalization.  Alyssa Blanchard has pre-assigned pt to [facility] An Rm 508-1 in anticipation of a discharge planned for later today; [facility] will call when they are ready to receive pt.  Pt presents under IVC initiated by EDP Geoffery Lyons, MD, and IVC documents have been faxed to [facility].  Pt's nurse has been notified, and agrees to call report to [phone].  Pt is to be transported via Patent [employer] when the time comes.   Doylene Canning, [place] [facility] Coordinator [phone]

## 2019-04-14 NOTE — ED Provider Notes (Signed)
I assumed care of this patient from Dr. Judd Lien.  Please see their note for further details of Hx, PE.  Briefly patient is a 49 y.o. female who presented hallucinations pending labs.  Patient's labs reassuring.  Mild hypokalemia that will be repleted orally. Patient is medically clear for behavioral health evaluation.  3:43 AM Patient is erratic.  Taking off her clothes and rolling around on the ground.  Difficult to redirect.  Requiring chemical sedation. QTc wNL.  .Critical Care Performed by: Nira Conn, MD Authorized by: Nira Conn, MD     CRITICAL CARE Performed by: Amadeo Garnet Onesha Krebbs Total critical care time: 30 minutes Critical care time was exclusive of separately billable procedures and treating other patients. Critical care was necessary to treat or prevent imminent or life-threatening deterioration. Critical care was time spent personally by me on the following activities: development of treatment plan with patient and/or surrogate as well as nursing, discussions with consultants, evaluation of patient's response to treatment, examination of patient, obtaining history from patient or surrogate, ordering and performing treatments and interventions, ordering and review of laboratory studies, ordering and review of radiographic studies, pulse oximetry and re-evaluation of patient's condition.        Nira Conn, MD 04/14/19 [phone]

## 2019-04-14 NOTE — ED Notes (Signed)
Pt began moving the stretcher out of the room This writer attempted to stop the pt and the pt began to ram the stretcher in this Paramedic was then pinned between the stretcher and the wall Pt refused to move and this Clinical research associate was not strong enough to free herself due to the combined weight of the pt and the stretcher Lindsay/RN moved in swiftly and was able to move the stretcher and the pt back into the room Pt refused to get back into bed stating that she wanted to be moved to room 8 because the her current room, room 6, was the mark of the devil Bobby/Charge RN was able to get pt back into bed Pt becoming increasingly uncooperative and manic

## 2019-04-14 NOTE — ED Notes (Signed)
Pt sitting in hallway outside of triage 6 refusing to return to room. Pt then began screaming and cussing at staff. Security assisted pt back to room.

## 2019-04-14 NOTE — ED Notes (Signed)
Pt unable to fit into purple scubs Pt placed in hospital gown Pt now sleeping comfortably in Triage 6

## 2019-04-14 NOTE — BH Assessment (Signed)
Tele Assessment Note   Patient Name: Alyssa Blanchard MRN: [id number] Referring Physician: Dr. Veryl Speak Location of Patient: [facility] Location of Provider: [place] is an 49 y.o. female brought in EMS due to erratic behaviors. Patient presenting to ED extremely irrational, hyperventilating and acutely psychotic. Patient has little information. When asked, why are you here, patient stated "the movie of my life not order". Patient stated, "watch [facility]". Patient stated, "it takes 7 days, listen to Garen Lah, Dr. Brenton Grills and [facility], they know how to make it go away". Patient reported "2x suicide attempts in 40's". Throughout assessment patient made random statements, "red light green light". Patient stated "embreding is scary" and stated "watch people with blue eyes, now people with brown eyes are supposed to be the son of righteous". Patient is poor historian. Patient was cooperative with flight of ideas.   PER TRIAGE NOTE: Patient was found in the [place] looking for her husband who was not there. Staff called EMS due to aggressive behavior from the patient and the patient saying someone was going to kill her. They found ETOH near her car in the hotel parking lot. Patient reported to EMS she was told she has a mood disorder but does not believe it. She has not been taking her medication. Early today the patient had a fight with her mother. In the past her mother tried to have her IVC'd. EMS reports the patient told them she was following a light and that an angel led her to the hotel. While driving she believes cars were trying to run her off the road and the people around her would look at her as if they hated her. EMS reports a flight of ideas. The patient reports not sleeping in 7 days because she doesn't want the dark to touch her. She would normally take trazodone, solumedrol and Wellbutrin.    Diagnosis: Psychosis disorder  Past  Medical History:  Past Medical History:  Diagnosis Date  . Anxiety   . Asthma   . Depression   . GERD (gastroesophageal reflux disease)   . Heel spur    bilat  . History of bronchitis   . History of chicken pox   . History of urinary tract infection   . Migraines   . Plantar fasciitis    bilat  . STD (sexually transmitted disease)    chl hx & hsv 1&2  . Tremors of nervous system   . Urinary incontinence     Past Surgical History:  Procedure Laterality Date  . LAPAROSCOPIC GASTRIC SLEEVE RESECTION N/A 11/27/2014   Procedure: LAPAROSCOPIC GASTRIC SLEEVE RESECTION WITH HIATAL HERNIA REPAIR UPPER ENDOSCOPY;  Surgeon: Johnathan Hausen, MD;  Location: [facility]S;  Service: General;  Laterality: N/A;  . TONSILLECTOMY  1978    Family History:  Family History  Problem Relation Age of Onset  . Diabetes Mother   . Hypertension Mother   . Hyperlipidemia Mother   . Kidney disease Mother   . Cancer Father        pancreatic  . Hypertension Maternal Grandmother   . Diabetes Maternal Grandmother   . Heart failure Maternal Grandmother        CHF  . Heart attack Maternal Grandfather   . Hypertension Maternal Grandfather   . Hypertension Paternal Grandmother   . Alzheimer's disease Paternal Grandmother   . Hypertension Paternal Grandfather   . Cancer Maternal Uncle        melanoma  .  Cancer Paternal Uncle        kidney    Social History:  reports that she quit smoking about 7 years ago. Her smoking use included cigarettes. She has a 1.25 pack-year smoking history. She has never used smokeless tobacco. She reports current alcohol use. She reports that she does not use drugs.  Additional Social History:  Alcohol / Drug Use Pain Medications: see MAR Prescriptions: see MAR Over the Counter: see MAR  CIWA: CIWA-Ar BP: 128/69 Pulse Rate: (!) 101 COWS:    Allergies:  Allergies  Allergen Reactions  . Gabapentin Anaphylaxis  . Topiramate Other (See Comments)    Jaw tightness and  chest discomfort   . Montelukast Other (See Comments)    Nightmares   . Other     Pt states no narcotics ever!     Home Medications: (Not in a hospital admission)   OB/GYN Status:  No LMP recorded.  General Assessment Data Location of Assessment: [facility] TTS Assessment: In system Is this a Tele or Face-to-Face Assessment?: Tele Assessment Is this an Initial Assessment or a Re-assessment for this encounter?: Initial Assessment Patient Accompanied by:: N/A Language Other than English: No Living Arrangements: (unable to assess) What gender do you identify as?: Female Marital status: Single Pregnancy Status: Unknown Living Arrangements: (unable to assess) Can pt return to current living arrangement?: Yes Admission Status: Voluntary Is patient capable of signing voluntary admission?: Yes Referral Source: Self/Family/Friend  Crisis Care Plan Living Arrangements: (unable to assess) Legal Guardian: (self) Name of Psychiatrist: (unable to assess) Name of Therapist: (unable to assess)  Education Status Is patient currently in school?: No Is the patient employed, unemployed or receiving disability?: (unable to assess)  Risk to self with the past 6 months Suicidal Ideation: No Has patient been a risk to self within the past 6 months prior to admission? : No Suicidal Intent: No Has patient had any suicidal intent within the past 6 months prior to admission? : No Is patient at risk for suicide?: No Suicidal Plan?: No Has patient had any suicidal plan within the past 6 months prior to admission? : No Access to Means: No What has been your use of drugs/alcohol within the last 12 months?: (unable to assess) Previous Attempts/Gestures: Yes How many times?: ("2x in 40's") Triggers for Past Attempts: Unknown Intentional Self Injurious Behavior: None Family Suicide History: Unable to assess Recent stressful life event(s): (mental illness) Persecutory voices/beliefs?: No Depression:  No Depression Symptoms: (n/a) Substance abuse history and/or treatment for substance abuse?: No Suicide prevention information given to non-admitted patients: Not applicable  Risk to Others within the past 6 months Homicidal Ideation: No Does patient have any lifetime risk of violence toward others beyond the six months prior to admission? : No Thoughts of Harm to Others: No Current Homicidal Intent: No Current Homicidal Plan: No Access to Homicidal Means: No Identified Victim: (n/a) History of harm to others?: No Assessment of Violence: None Noted Violent Behavior Description: (none reported) Does patient have access to weapons?: (unable to assess) Criminal Charges Pending?: No Does patient have a court date: No Is patient on probation?: No  Psychosis Hallucinations: (unable to assess) Delusions: Unspecified  Mental Status Report Appearance/Hygiene: Unremarkable Eye Contact: Fair Motor Activity: Freedom of movement Speech: Pressured Level of Consciousness: Alert Mood: Anxious Affect: Anxious Anxiety Level: Moderate Thought Processes: Flight of Ideas Judgement: Impaired Orientation: Unable to assess Obsessive Compulsive Thoughts/Behaviors: Unable to Assess  Cognitive Functioning Concentration: Fair Memory: Recent Impaired Is patient IDD:  No Insight: Poor Impulse Control: Poor Appetite: Fair Have you had any weight changes? : No Change Sleep: Unable to Assess Total Hours of Sleep: (unable to assess) Vegetative Symptoms: Unable to Assess  ADLScreening [facility] Assessment Services) Patient's cognitive ability adequate to safely complete daily activities?: Yes Patient able to express need for assistance with ADLs?: Yes Independently performs ADLs?: Yes (appropriate for developmental age)  Prior Inpatient Therapy Prior Inpatient Therapy: Yes Prior Therapy Dates: (unable to assess) Prior Therapy Facilty/Provider(s): (unable to assess) Reason for Treatment: (mental  illness)  Prior Outpatient Therapy Prior Outpatient Therapy: No Does patient have an ACCT team?: No Does patient have Intensive In-House Services?  : No Does patient have [facility] services? : No Does patient have P4CC services?: No  ADL Screening (condition at time of admission) Patient's cognitive ability adequate to safely complete daily activities?: Yes Patient able to express need for assistance with ADLs?: Yes Independently performs ADLs?: Yes (appropriate for developmental age)  Merchant navy officer (For Healthcare) Does Patient Have a Medical Advance Directive?: No  Disposition:  Disposition Initial Assessment Completed for this Encounter: Yes  Sherron Flemings, NP, patient meets inpatient criteria. [facility] no appropriate beds. TTS to secure placement.  This service was provided via telemedicine using a 2-way, interactive audio and video technology.  Names of all persons participating in this telemedicine service and their role in this encounter. Name: Alyssa Blanchard Role: Patient  Name: Al Corpus Role: TTS Clinician  Name:  Role:   Name:  Role:     Burnetta Sabin 04/14/2019 5:19 AM

## 2019-04-14 NOTE — ED Notes (Signed)
Unknown as to why covid swab was not done at 0300... will collect now.

## 2019-04-14 NOTE — Progress Notes (Signed)
Received Alyssa Blanchard this PM in and out of her room for varies requests. The sitter is at the bedside. Later report was called and report was given at 2048 hrs to nurse Casimiro Needle. [employer] was notified for transport at 2053 hrs. She was transported at 2115 hrs without incident.
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
## 2019-04-15 ENCOUNTER — Encounter (HOSPITAL_COMMUNITY): Payer: Self-pay | Admitting: Psychiatry

## 2019-04-15 ENCOUNTER — Other Ambulatory Visit: Payer: Self-pay

## 2019-04-15 DIAGNOSIS — F3110 Bipolar disorder, current episode manic without psychotic features, unspecified: Secondary | ICD-10-CM

## 2019-04-15 DIAGNOSIS — F3174 Bipolar disorder, in full remission, most recent episode manic: Secondary | ICD-10-CM

## 2019-04-15 LAB — URINALYSIS, ROUTINE W REFLEX MICROSCOPIC
Bilirubin Urine: NEGATIVE
Glucose, UA: NEGATIVE mg/dL
Hgb urine dipstick: NEGATIVE
Ketones, ur: 20 mg/dL — AB
Leukocytes,Ua: NEGATIVE
Nitrite: NEGATIVE
Protein, ur: NEGATIVE mg/dL
Specific Gravity, Urine: 1.014 (ref 1.005–1.030)
pH: 6 (ref 5.0–8.0)

## 2019-04-15 LAB — RAPID URINE DRUG SCREEN, HOSP PERFORMED
Amphetamines: NOT DETECTED
Barbiturates: NOT DETECTED
Benzodiazepines: POSITIVE — AB
Cocaine: NOT DETECTED
Opiates: NOT DETECTED
Tetrahydrocannabinol: NOT DETECTED

## 2019-04-15 MED ORDER — HALOPERIDOL LACTATE 5 MG/ML IJ SOLN
5.0000 mg | Freq: Once | INTRAMUSCULAR | Status: DC
  Filled 2019-04-15: qty 1

## 2019-04-15 MED ORDER — HALOPERIDOL LACTATE 5 MG/ML IJ SOLN
10.0000 mg | Freq: Two times a day (BID) | INTRAMUSCULAR | Status: DC | PRN
  Administered 2019-04-17 (×2): 10 mg via INTRAMUSCULAR
  Filled 2019-04-15: qty 2

## 2019-04-15 MED ORDER — PANTOPRAZOLE SODIUM 40 MG PO TBEC
40.0000 mg | DELAYED_RELEASE_TABLET | Freq: Every day | ORAL | Status: DC
  Administered 2019-04-15 – 2019-04-19 (×5): 40 mg via ORAL
  Filled 2019-04-15 (×7): qty 1

## 2019-04-15 MED ORDER — RISPERIDONE 3 MG PO TABS
3.0000 mg | ORAL_TABLET | Freq: Two times a day (BID) | ORAL | Status: DC
  Administered 2019-04-15: 17:00:00 3 mg via ORAL
  Filled 2019-04-15 (×5): qty 1

## 2019-04-15 MED ORDER — TEMAZEPAM 30 MG PO CAPS
30.0000 mg | ORAL_CAPSULE | Freq: Every day | ORAL | Status: DC
  Administered 2019-04-15 – 2019-04-17 (×3): 30 mg via ORAL
  Filled 2019-04-15 (×3): qty 1

## 2019-04-15 MED ORDER — LORAZEPAM 2 MG/ML IJ SOLN
2.0000 mg | Freq: Once | INTRAMUSCULAR | Status: AC
  Administered 2019-04-18: 2 mg via INTRAMUSCULAR
  Filled 2019-04-15: qty 1

## 2019-04-15 MED ORDER — LEVALBUTEROL TARTRATE 45 MCG/ACT IN AERO
2.0000 | INHALATION_SPRAY | Freq: Three times a day (TID) | RESPIRATORY_TRACT | Status: DC
  Administered 2019-04-15 – 2019-04-20 (×13): 2 via RESPIRATORY_TRACT
  Filled 2019-04-15: qty 15

## 2019-04-15 MED ORDER — PROPRANOLOL HCL 10 MG PO TABS
10.0000 mg | ORAL_TABLET | Freq: Two times a day (BID) | ORAL | Status: DC
  Administered 2019-04-15 – 2019-04-20 (×10): 10 mg via ORAL
  Filled 2019-04-15 (×17): qty 1

## 2019-04-15 MED ORDER — FLUTICASONE FUROATE-VILANTEROL 200-25 MCG/INH IN AEPB
1.0000 | INHALATION_SPRAY | Freq: Every day | RESPIRATORY_TRACT | Status: DC
  Administered 2019-04-15 – 2019-04-20 (×4): 1 via RESPIRATORY_TRACT
  Filled 2019-04-15: qty 28

## 2019-04-15 MED ORDER — BENZTROPINE MESYLATE 1 MG PO TABS
1.0000 mg | ORAL_TABLET | Freq: Two times a day (BID) | ORAL | Status: DC
  Administered 2019-04-15 – 2019-04-20 (×8): 1 mg via ORAL
  Filled 2019-04-15 (×15): qty 1

## 2019-04-15 NOTE — Progress Notes (Signed)
SPIRITUALITY GROUP NOTE  Spirituality group facilitated by Wilkie Aye, MDiv, [facility].  Group Description:  Group focused on topic of hope.  Patients participated in facilitated discussion around topic, connecting with one another around experiences and definitions for hope.  Group members engaged with visual explorer photos, reflecting on what hope looks like for them today.  Group engaged in discussion around how their definitions of hope are present today in hospital.   Modalities: Psycho-social ed, Adlerian, Narrative, MI Patient Progress:  Arrived at last 20 minutes of group.  Engaged in group topic with affect appropriate to setting.
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
## 2019-04-15 NOTE — Tx Team (Signed)
Initial Treatment Plan 04/15/2019 3:35 AM Alyssa Blanchard AST:[id number]    PATIENT STRESSORS: Legal issue Marital or family conflict Medication change or noncompliance   PATIENT STRENGTHS: General fund of knowledge Motivation for treatment/growth   PATIENT IDENTIFIED PROBLEMS:   psychosis  "keeping self calm, depression, getting sleep"                   DISCHARGE CRITERIA:  Improved stabilization in mood, thinking, and/or behavior Verbal commitment to aftercare and medication compliance  PRELIMINARY DISCHARGE PLAN: Attend aftercare/continuing care group Outpatient therapy  PATIENT/FAMILY INVOLVEMENT: This treatment plan has been presented to and reviewed with the patient, Alyssa Blanchard.  The patient and family have been given the opportunity to ask questions and make suggestions.  Delos Haring, RN 04/15/2019, 3:35 AM

## 2019-04-15 NOTE — Progress Notes (Signed)
Recreation Therapy Notes  INPATIENT RECREATION THERAPY ASSESSMENT  Patient Details Name: Alyssa Blanchard MRN: [id number] DOB: [birth year] Today's Date: 04/15/2019       Information Obtained From: Patient  Able to Participate in Assessment/Interview: Yes  Patient Presentation: Alert  Reason for Admission (Per Patient): Other (Comments)(Pt stated she went to the wrong house and went to [place] at the wrong time.)  Patient Stressors: Other (Comment)(Pt stated she thought her fiance left her and hasn't slept for a few weeks)  Coping Skills:   Isolation, Write, Sports, TV, Arguments, Music, Meditate, Deep Breathing, Prayer, Avoidance, Read, Hot Bath/Shower  Leisure Interests (2+):  Individual - TV, Ashby Dawes - Other (Comment)([place], Loves animals, Sit outside)  Frequency of Recreation/Participation: Weekly  Awareness of Community Resources:  No  Expressed Interest in [employer] Information: No  [employer] of Residence:  Engineer, technical sales  Patient Main Form of Transportation: Set designer  Patient Strengths:  Communication; Empathy; Sees the beauty in others  Patient Identified Areas of Improvement:  Faith; Ignore those that don't believe  Patient Goal for Hospitalization:  "get the right medication and sleep"  Current SI (including self-harm):  No  Current HI:  No  Current AVH: No  Staff Intervention Plan: Group Attendance, Collaborate with Interdisciplinary Treatment Team  Consent to Intern Participation: N/A    Caroll Rancher, LRT/CTRS  Caroll Rancher A 04/15/2019, 12:59 PM

## 2019-04-15 NOTE — Progress Notes (Signed)
Recreation Therapy Notes  Date: 2.5.21 Time: 1000 Location: 500 Hall Dayroom  Group Topic: Triggers  Goal Area(s) Addresses:  Patient will identify their 3 biggest triggers. Patient will verbalize how to avoid triggers. Patient will verbalize how to deal with triggers head on.  Intervention: Worksheet, music  Activity: Triggers.  Patients were to identify their 3 biggest triggers.  Patients then identified how to deal with triggers head on and how to avoid triggers.  Education: Triggers, Discharge Planning  Education Outcome: Acknowledges understanding/In group clarification offered/Needs additional education.   Clinical Observations/Feedback: Pt did not attend group session.    Caroll Rancher, LRT/CTRS         Caroll Rancher A 04/15/2019 12:07 PM

## 2019-04-15 NOTE — Progress Notes (Addendum)
[facility] Second Physician Opinion Progress Note for Medication Administration to Non-consenting Patients (For Involuntarily Committed Patients)  Patient: Alyssa Blanchard Date of Birth: [id number] MRN: [id number]  Reason for the Medication: The patient, without the benefit of the specific treatment measure, is incapable of participating in any available treatment plan that will give the patient a realistic opportunity of improving the patient's condition.  Consideration of Side Effects: Consideration of the side effects related to the medication plan has been given.  Rationale for Medication Administration:  Asked by Dr. Jeannine Kitten, patient's Attending Psychiatrist , to evaluate patient med administration for non consenting patient. Patient presented to ED via EMS for agitated , disruptive behavior at a local hotel . Presented with symptoms of mania, with pressured speech, disorganized thought process , limited insight. She reports she had not slept for several days prior to admission.      Craige Cotta, MD 04/15/19  4:15 PM   This documentation is good for (7) seven days from the date of the MD signature. New documentation must be completed every seven (7) days with detailed justification in the medical record if the patient requires continued non-emergent administration of psychotropic medications.
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
## 2019-04-15 NOTE — Progress Notes (Signed)
   04/15/19 2100  Psych Admission Type (Psych Patients Only)  Admission Status Involuntary  Psychosocial Assessment  Patient Complaints Anxiety  Eye Contact Brief  Facial Expression Animated;Anxious  Affect Anxious  Speech Pressured  Interaction Assertive;Dominating  Motor Activity Restless  Appearance/Hygiene Unremarkable  Behavior Characteristics Anxious  Mood Depressed  Thought Process  Coherency WDL  Content WDL  Delusions WDL;None reported or observed  Perception WDL  Hallucination None reported or observed  Judgment WDL  Confusion WDL  Danger to Self  Current suicidal ideation? Denies  Danger to Others  Danger to Others None reported or observed

## 2019-04-15 NOTE — Progress Notes (Signed)
Pt reported feeling anxious and tired.  She did not want to take some of her medications including cogentin and risperidone because "those pills make me feel like crap."  RN provided education and validated pt's feelings/concerns.  Pt decided to take all of her afternoon medications as ordered by pt's MD.  Pt remains safe on the unit.  RN will continue to monitor and provide support as needed.      04/15/19 1600  Psych Admission Type (Psych Patients Only)  Admission Status Involuntary  Psychosocial Assessment  Patient Complaints Anxiety  Eye Contact Brief  Facial Expression Animated;Anxious  Affect Anxious  Speech Pressured  Interaction Assertive;Dominating  Motor Activity Restless  Appearance/Hygiene Unremarkable  Behavior Characteristics Anxious  Mood Depressed;Anxious  Thought Process  Coherency WDL  Content WDL  Delusions WDL;None reported or observed  Perception WDL  Hallucination None reported or observed  Judgment WDL  Confusion WDL  Danger to Self  Current suicidal ideation? Denies  Danger to Others  Danger to Others None reported or observed
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
## 2019-04-15 NOTE — Tx Team (Signed)
Interdisciplinary Treatment and Diagnostic Plan Update  04/15/2019 Time of Session: 10:15am Alyssa Blanchard MRN: [id number]  Principal Diagnosis: <principal problem not specified>  Secondary Diagnoses: Active Problems:   Psychosis ([place])   Current Medications:  Current Facility-Administered Medications  Medication Dose Route Frequency Provider Last Rate Last Admin  . acetaminophen (TYLENOL) tablet 650 mg  650 mg Oral Q6H PRN Emmaline Kluver, FNP   650 mg at 04/15/19 0305  . albuterol (VENTOLIN HFA) 108 (90 Base) MCG/ACT inhaler 1 puff  1 puff Inhalation Q6H PRN Emmaline Kluver, FNP      . alum & mag hydroxide-simeth (MAALOX/MYLANTA) 200-200-20 MG/5ML suspension 30 mL  30 mL Oral Q4H PRN Emmaline Kluver, FNP      . benztropine (COGENTIN) tablet 1 mg  1 mg Oral BID Johnn Hai, MD      . ipratropium-albuterol (DUONEB) 0.5-2.5 (3) MG/3ML nebulizer solution 3 mL  3 mL Nebulization Q6H Letitia Libra L, FNP      . magnesium hydroxide (MILK OF MAGNESIA) suspension 30 mL  30 mL Oral Daily PRN Emmaline Kluver, FNP      . propranolol (INDERAL) tablet 10 mg  10 mg Oral BID Johnn Hai, MD      . risperiDONE (RISPERDAL) tablet 3 mg  3 mg Oral BID Johnn Hai, MD      . sodium chloride (OCEAN) 0.65 % nasal spray 1 spray  1 spray Each Nare PRN Deloria Lair, NP   1 spray at 04/14/19 2310  . temazepam (RESTORIL) capsule 30 mg  30 mg Oral QHS Johnn Hai, MD      . traZODone (DESYREL) tablet 100 mg  100 mg Oral QHS PRN,MR X 1 Dixon, Rashaun M, NP   100 mg at 04/14/19 2350   PTA Medications: Medications Prior to Admission  Medication Sig Dispense Refill Last Dose  . albuterol (VENTOLIN HFA) 108 (90 Base) MCG/ACT inhaler INHALE 2 PUFFS INTO THE LUNGS EVERY 4 HOURS AS NEEDED FOR WHEEZING OR FOR SHORTNESS OF BREATH (Patient not taking: Reported on 04/13/2019) 18 g 1   . benzonatate (TESSALON PERLES) 100 MG capsule Take 2 capsules (200 mg total) by mouth 3 (three) times daily as needed for cough. (Patient not taking:  Reported on 04/13/2019) 20 capsule 0   . ipratropium-albuterol (DUONEB) 0.5-2.5 (3) MG/3ML SOLN Take 3 mLs by nebulization every 6 (six) hours. (Patient not taking: Reported on 04/13/2019) 180 mL 0   . montelukast (SINGULAIR) 10 MG tablet Take 1 tablet (10 mg total) by mouth at bedtime. (Patient not taking: Reported on 04/13/2019) 30 tablet 3   . predniSONE (DELTASONE) 10 MG tablet Take 5 tablets (50 mg total) by mouth daily. (Patient not taking: Reported on 04/13/2019) 25 tablet 0   . SUMAtriptan (IMITREX) 100 MG tablet ! Tab at onset of HA. May repeat in 2 hours if headache persists or recurs.No more than 4 doses a month. (Patient not taking: Reported on 04/13/2019) 10 tablet 5     Patient Stressors: Legal issue Marital or family conflict Medication change or noncompliance  Patient Strengths: General fund of knowledge Motivation for treatment/growth  Treatment Modalities: Medication Management, Group therapy, Case management,  1 to 1 session with clinician, Psychoeducation, Recreational therapy.   Physician Treatment Plan for Primary Diagnosis: <principal problem not specified> Long Term Goal(s):     Short Term Goals:    Medication Management: Evaluate patient's response, side effects, and tolerance of medication regimen.  Therapeutic Interventions: 1 to 1 sessions, Unit  Group sessions and Medication administration.  Evaluation of Outcomes: Not Met  Physician Treatment Plan for Secondary Diagnosis: Active Problems:   Psychosis ([place])  Long Term Goal(s):     Short Term Goals:       Medication Management: Evaluate patient's response, side effects, and tolerance of medication regimen.  Therapeutic Interventions: 1 to 1 sessions, Unit Group sessions and Medication administration.  Evaluation of Outcomes: Not Met   RN Treatment Plan for Primary Diagnosis: <principal problem not specified> Long Term Goal(s): Knowledge of disease and therapeutic regimen to maintain health will  improve  Short Term Goals: Ability to participate in decision making will improve, Ability to verbalize feelings will improve, Ability to disclose and discuss suicidal ideas, Ability to identify and develop effective coping behaviors will improve and Compliance with prescribed medications will improve  Medication Management: RN will administer medications as ordered by provider, will assess and evaluate patient's response and provide education to patient for prescribed medication. RN will report any adverse and/or side effects to prescribing provider.  Therapeutic Interventions: 1 on 1 counseling sessions, Psychoeducation, Medication administration, Evaluate responses to treatment, Monitor vital signs and CBGs as ordered, Perform/monitor CIWA, COWS, AIMS and Fall Risk screenings as ordered, Perform wound care treatments as ordered.  Evaluation of Outcomes: Not Met   LCSW Treatment Plan for Primary Diagnosis: <principal problem not specified> Long Term Goal(s): Safe transition to appropriate next level of care at discharge, Engage patient in therapeutic group addressing interpersonal concerns.  Short Term Goals: Engage patient in aftercare planning with referrals and resources and Increase skills for wellness and recovery  Therapeutic Interventions: Assess for all discharge needs, 1 to 1 time with Social worker, Explore available resources and support systems, Assess for adequacy in community support network, Educate family and significant other(s) on suicide prevention, Complete Psychosocial Assessment, Interpersonal group therapy.  Evaluation of Outcomes: Not Met   Progress in Treatment: Attending groups: No. new to unit  Participating in groups: No. Taking medication as prescribed: No. Toleration medication: No. Family/Significant other contact made: No, will contact:  if given consent  Patient understands diagnosis: No. Discussing patient identified problems/goals with staff:  Yes. Medical problems stabilized or resolved: Yes. Denies suicidal/homicidal ideation: Yes. Issues/concerns per patient self-inventory: No. Other:   New problem(s) identified: No, Describe:  none  New Short Term/Long Term Goal(s): Medication stabilization, elimination of SI thoughts, and development of a comprehensive mental wellness plan.   Patient Goals:  Pt was in a meeting with the NP  Discharge Plan or Barriers: CSW will continue to follow up for appropriate referrals and possible discharge planning   Reason for Continuation of Hospitalization: Aggression Delusions  Hallucinations  Estimated Length of Stay: 3-5 days   Attendees: Patient: Alyssa Blanchard 04/15/2019   Physician: Dr. Jake Samples, MD  04/15/2019   Nursing:  04/15/2019   RN Care Manager: 04/15/2019   Social Worker: Ardelle Anton, LCSW 04/15/2019   Recreational Therapist:  04/15/2019   Other: Ovidio Kin, MSW intern  04/15/2019   Other:  Sharl Ma, NP intern  04/15/2019   Other: 04/15/2019     Scribe for Treatment Team: Billey Chang, Student-Social Work 04/15/2019 10:31 AM

## 2019-04-16 LAB — LIPID PANEL
Cholesterol: 137 mg/dL (ref 0–200)
HDL: 37 mg/dL — ABNORMAL LOW (ref >40)
LDL Cholesterol: 78 mg/dL (ref 0–99)
Total CHOL/HDL Ratio: 3.7 RATIO
Triglycerides: 109 mg/dL (ref <150)
VLDL: 22 mg/dL (ref 0–40)

## 2019-04-16 LAB — TSH: TSH: 0.723 u[IU]/mL (ref 0.350–4.500)

## 2019-04-16 LAB — HEMOGLOBIN A1C
Hgb A1c MFr Bld: 5.8 % — ABNORMAL HIGH (ref 4.8–5.6)
Mean Plasma Glucose: 119.76 mg/dL

## 2019-04-16 MED ORDER — RISPERIDONE 2 MG PO TABS
4.0000 mg | ORAL_TABLET | Freq: Every day | ORAL | Status: DC
  Filled 2019-04-16 (×3): qty 2

## 2019-04-16 MED ORDER — POTASSIUM CHLORIDE CRYS ER 20 MEQ PO TBCR
20.0000 meq | EXTENDED_RELEASE_TABLET | Freq: Two times a day (BID) | ORAL | Status: AC
  Administered 2019-04-16 – 2019-04-17 (×2): 20 meq via ORAL
  Filled 2019-04-16 (×4): qty 1

## 2019-04-16 MED ORDER — RISPERIDONE 1 MG PO TABS
1.0000 mg | ORAL_TABLET | Freq: Every day | ORAL | Status: DC
  Administered 2019-04-16: 1 mg via ORAL
  Filled 2019-04-16 (×4): qty 1

## 2019-04-16 NOTE — Plan of Care (Signed)
Problem: Activity: Goal: Interest or engagement in activities will improve Outcome: Progressing   Problem: Safety: Goal: Periods of time without injury will increase Outcome: Progressing   Problem: Nutritional: Goal: Ability to achieve adequate nutritional intake will improve Outcome: Progressing  Pt visible in hallway on initial interaction. Presents with bright affect and pleasant mood. Presents with logical speech, answers assessment questions correctly, fair eye contact but is somatic on interactions. Denies SI, HI, AVH and pain when assessed. Reports fair sleep, good appetite and normal energy level on self inventory sheet. Per nursing report, pt only slept 3.75 hours last night, "I'm fine, I was up and down most of the night but I slept some". Remains medication compliant. Rates her anxiety, depression and hopelessness all 0/10. Reported that her Risperdal 3 mg PO dose was "too strong for me, I felt like I was about to pass out, I felt sick, I will take it if the dose is lower".  Emotional support and availability offered to pt throughout this shift. Scheduled medications administered as ordered with verbal education and effects monitored. Pt encouraged to voice concerns. Q 15 minutes safety checks maintained without incident.  Tolerates all meals and fluids well. Requested to wear her sneakers without strings "my feet are really hurting on these floors" and was given to her. Pt complained of feeling nauseous and "I feel like I'm going to pass out" just after taking her lower dose of Risperdal 1 mg PO. Came up to writer again stating "I'm fine now, don't get me anything for nausea, I prayed about it, used a wet rag and I'm fine". Attended scheduled groups and unit activities. Remains safe on and off unit.

## 2019-04-16 NOTE — BHH Group Notes (Signed)
  B[facility] LCSW Group Therapy Note  Date/Time:  04/16/2019 11:05AM-11:40AM  Type of Therapy and Topic:  Group Therapy:  Feelings About Hospitalization  Participation Level:  Active   Description of Group This process group involved patients discussing their feelings related to being hospitalized, as well as the benefits they see to being in the hospital.  These feelings and benefits were itemized.  The group then brainstormed specific ways in which they could seek those same benefits when they discharge and return home.  Therapeutic Goals 1. Patient will identify and describe positive and negative feelings related to hospitalization 2. Patient will verbalize benefits of hospitalization to themselves personally 3. Patients will brainstorm together ways they can obtain similar benefits in the outpatient setting, identify barriers to wellness and possible solutions  Summary of Patient Progress:  The patient expressed her primary feelings about being hospitalized were initially upset due to not feeling that she should be here and that her opinions and beliefs surrounding preceding incidents were not being heard. Pt reported feeling significantly better now, after having had ample rest and gotten an adequate amount of sleep. Pt proved able to identify and relate to other group members feelings when initially expressing feeling trapped and not wanting to be here, however support group members in expressing the level of support she has received since being admitted. Pt actively identified the benefits she has received since being admitted, including daily programming/scheduling, keeping to routines, developed structure, and the ability to establish goals for aftercare. Pt identified the ways in which she can obtain the same level of support from community services, clearly expressing the need for a therapist, and identified effective coping mechanisms she has found supportive. Pt identified oftentimes one can  be a barrier to themselves from getting the necessary supports they need. Pt proved actively provided feedback and support for other group members and proved receptive to feedback from group facilitator.  Therapeutic Modalities Cognitive Behavioral Therapy Motivational Interviewing    Cyril Loosen, LCSWA 04/16/2019, 2:45a

## 2019-04-16 NOTE — BHH Group Notes (Signed)
Adult Psychoeducational Group Note  Date:  04/16/2019 Time:  12:00 PM  Group Topic/Focus:  Goals Group:   The focus of this group is to help patients establish daily goals to achieve during treatment and discuss how the patient can incorporate goal setting into their daily lives to aide in recovery.  Participation Level:  Minimal  Participation Quality:  Redirectable  Affect:  Blunted  Cognitive:  Oriented  Insight: Lacking  Engagement in Group:  Engaged  Modes of Intervention:  Discussion and Support  Additional Comments:  Pt came into the session late. Was able to set a goal of "praying when I get upset".  Vira Blanco A 04/16/2019, 12:00 PM

## 2019-04-16 NOTE — Progress Notes (Signed)
Pt stated she took the 3 mg of Risperdal and it made her feel funny. Pt stated she felt that the 3 mg was too much and would still be will to try the Risperdal, but at a lower dose.

## 2019-04-16 NOTE — Progress Notes (Signed)
[facility] MD Progress Note  04/16/2019 8:47 AM Alyssa Blanchard  MRN:  [id number] Subjective:  "I'm much better since I got some sleep."  Alyssa Blanchard seen sitting in her room. She is calm and cooperative on assessment and presents more organized with normal rate of speech. Second opinion for forced medication order was initiated yesterday, and patient took scheduled oral Risperdal. She reports improvement in her thinking and mood after sleeping well overnight. 3.75 hours of sleep recorded overnight. She complains of fatigue related to Risperdal 3 mg dose and is agreeable to adjust most of daily dose to HS schedule. She states erratic behaviors prior to admission were related to lack of sleep for several days. She continues to present with poor insight and some delusional thought content. She states she does not need medication and only needs prayers and sleep. She talks about "my soul mate Alyssa Blanchard who is always with me." She states Alyssa Blanchard is in heaven and when asked if he passed away, she states, "Kind of. No. It's complicated." She reports recently moving out of her mother's home due to conflict with her mother, and states she is unwilling to return there at discharge but has nowhere else to go. She denies SI/HI/AVH. She shows no signs of responding to internal stimuli. No agitated or disruptive behaviors on the unit.  From admission assessment: Alyssa Blanchard is an 49 y.o. female brought in EMS due to erratic behaviors. Patient presenting to ED extremely irrational, hyperventilating and acutely psychotic. Patient has little information. When asked, why are you here, patient stated "the movie of my life not order".   Principal Problem: Bipolar I disorder, most recent episode (or current) manic (HCC) Diagnosis: Principal Problem:   Bipolar I disorder, most recent episode (or current) manic (HCC) Active Problems:   Psychosis (HCC)  Total Time spent with patient: 15 minutes  Past Psychiatric History: See admission  H&P  Past Medical History:  Past Medical History:  Diagnosis Date  . Anxiety   . Asthma   . Depression   . GERD (gastroesophageal reflux disease)   . Heel spur    bilat  . History of bronchitis   . History of chicken pox   . History of urinary tract infection   . Migraines   . Plantar fasciitis    bilat  . STD (sexually transmitted disease)    chl hx & hsv 1&2  . Tremors of nervous system   . Urinary incontinence     Past Surgical History:  Procedure Laterality Date  . LAPAROSCOPIC GASTRIC SLEEVE RESECTION N/A 11/27/2014   Procedure: LAPAROSCOPIC GASTRIC SLEEVE RESECTION WITH HIATAL HERNIA REPAIR UPPER ENDOSCOPY;  Surgeon: Luretha Murphy, MD;  Location: [facility] ORS;  Service: General;  Laterality: N/A;  . TONSILLECTOMY  1978   Family History:  Family History  Problem Relation Age of Onset  . Diabetes Mother   . Hypertension Mother   . Hyperlipidemia Mother   . Kidney disease Mother   . Cancer Father        pancreatic  . Hypertension Maternal Grandmother   . Diabetes Maternal Grandmother   . Heart failure Maternal Grandmother        CHF  . Heart attack Maternal Grandfather   . Hypertension Maternal Grandfather   . Hypertension Paternal Grandmother   . Alzheimer's disease Paternal Grandmother   . Hypertension Paternal Grandfather   . Cancer Maternal Uncle        melanoma  . Cancer Paternal Uncle  kidney   Family Psychiatric  History: See admission H&P Social History:  Social History   Substance and Sexual Activity  Alcohol Use Yes  . Alcohol/week: 0.0 standard drinks   Comment: 1 a month     Social History   Substance and Sexual Activity  Drug Use Yes  . Types: Marijuana, Cocaine    Social History   Socioeconomic History  . Marital status: Single    Spouse name: Not on file  . Number of children: Not on file  . Years of education: 35  . Highest education level: Not on file  Occupational History  . Occupation: Chartered certified accountant: [employer]  Tobacco Use  . Smoking status: Former Smoker    Packs/day: 0.25    Years: 5.00    Pack years: 1.25    Types: Cigarettes    Quit date: 03/10/2012    Years since quitting: 7.1  . Smokeless tobacco: Never Used  Substance and Sexual Activity  . Alcohol use: Yes    Alcohol/week: 0.0 standard drinks    Comment: 1 a month  . Drug use: Yes    Types: Marijuana, Cocaine  . Sexual activity: Yes    Partners: Male    Birth control/protection: Condom  Other Topics Concern  . Not on file  Social History Narrative   Regular exercise-no   Caffeine Use-yes, 1-2 cups of caffeine daily   Social Determinants of Health   Financial Resource Strain:   . Difficulty of Paying Living Expenses: Not on file  Food Insecurity:   . Worried About Programme researcher, broadcasting/film/video in the Last Year: Not on file  . Ran Out of Food in the Last Year: Not on file  Transportation Needs:   . Lack of Transportation (Medical): Not on file  . Lack of Transportation (Non-Medical): Not on file  Physical Activity:   . Days of Exercise per Week: Not on file  . Minutes of Exercise per Session: Not on file  Stress:   . Feeling of Stress : Not on file  Social Connections:   . Frequency of Communication with Friends and Family: Not on file  . Frequency of Social Gatherings with Friends and Family: Not on file  . Attends Religious Services: Not on file  . Active Member of Clubs or Organizations: Not on file  . Attends Banker Meetings: Not on file  . Marital Status: Not on file   Additional Social History:                         Sleep: Fair  Appetite:  Good  Current Medications: Current Facility-Administered Medications  Medication Dose Route Frequency Provider Last Rate Last Admin  . acetaminophen (TYLENOL) tablet 650 mg  650 mg Oral Q6H PRN Patrcia Dolly, FNP   650 mg at 04/16/19 0816  . albuterol (VENTOLIN HFA) 108 (90 Base) MCG/ACT inhaler 1 puff  1 puff Inhalation Q6H PRN Patrcia Dolly, FNP       . alum & mag hydroxide-simeth (MAALOX/MYLANTA) 200-200-20 MG/5ML suspension 30 mL  30 mL Oral Q4H PRN Patrcia Dolly, FNP      . benztropine (COGENTIN) tablet 1 mg  1 mg Oral BID Malvin Johns, MD   1 mg at 04/16/19 0804  . fluticasone furoate-vilanterol (BREO ELLIPTA) 200-25 MCG/INH 1 puff  1 puff Inhalation Daily Nwoko, Agnes I, NP   1 puff at 04/15/19 1530  . haloperidol lactate (  HALDOL) injection 10 mg  10 mg Intramuscular BID PRN Malvin Johns, MD      . haloperidol lactate (HALDOL) injection 5 mg  5 mg Intramuscular Once Malvin Johns, MD      . ipratropium-albuterol (DUONEB) 0.5-2.5 (3) MG/3ML nebulizer solution 3 mL  3 mL Nebulization Q6H Patrcia Dolly, FNP      . levalbuterol (XOPENEX HFA) inhaler 2 puff  2 puff Inhalation Q8H Armandina Stammer I, NP   2 puff at 04/16/19 0807  . LORazepam (ATIVAN) injection 2 mg  2 mg Intramuscular Once Malvin Johns, MD      . magnesium hydroxide (MILK OF MAGNESIA) suspension 30 mL  30 mL Oral Daily PRN Patrcia Dolly, FNP      . pantoprazole (PROTONIX) EC tablet 40 mg  40 mg Oral Daily Armandina Stammer I, NP   40 mg at 04/16/19 0804  . propranolol (INDERAL) tablet 10 mg  10 mg Oral BID Malvin Johns, MD   10 mg at 04/16/19 9935  . risperiDONE (RISPERDAL) tablet 1 mg  1 mg Oral Daily Aldean Baker, NP      . risperiDONE (RISPERDAL) tablet 4 mg  4 mg Oral QHS Aldean Baker, NP      . sodium chloride (OCEAN) 0.65 % nasal spray 1 spray  1 spray Each Nare PRN Jearld Lesch, NP   1 spray at 04/15/19 1526  . temazepam (RESTORIL) capsule 30 mg  30 mg Oral QHS Malvin Johns, MD   30 mg at 04/15/19 2118  . traZODone (DESYREL) tablet 100 mg  100 mg Oral QHS PRN,MR X 1 Dixon, Rashaun M, NP   100 mg at 04/14/19 2350    Lab Results:  Results for orders placed or performed during the hospital encounter of 04/14/19 (from the past 48 hour(s))  Urinalysis, Routine w reflex microscopic     Status: Abnormal   Collection Time: 04/15/19  1:55 PM  Result Value Ref Range   Color,  Urine YELLOW YELLOW   APPearance HAZY (A) CLEAR   Specific Gravity, Urine 1.014 1.005 - 1.030   pH 6.0 5.0 - 8.0   Glucose, UA NEGATIVE NEGATIVE mg/dL   Hgb urine dipstick NEGATIVE NEGATIVE   Bilirubin Urine NEGATIVE NEGATIVE   Ketones, ur 20 (A) NEGATIVE mg/dL   Protein, ur NEGATIVE NEGATIVE mg/dL   Nitrite NEGATIVE NEGATIVE   Leukocytes,Ua NEGATIVE NEGATIVE    Comment: Performed at [facility], [address] [place], [place] [address]  Urine rapid drug screen (hosp performed)     Status: Abnormal   Collection Time: 04/15/19  1:55 PM  Result Value Ref Range   Opiates NONE DETECTED NONE DETECTED   Cocaine NONE DETECTED NONE DETECTED   Benzodiazepines POSITIVE (A) NONE DETECTED   Amphetamines NONE DETECTED NONE DETECTED   Tetrahydrocannabinol NONE DETECTED NONE DETECTED   Barbiturates NONE DETECTED NONE DETECTED    Comment: (NOTE) DRUG SCREEN FOR MEDICAL PURPOSES ONLY.  IF CONFIRMATION IS NEEDED FOR ANY PURPOSE, NOTIFY LAB WITHIN 5 DAYS. LOWEST DETECTABLE LIMITS FOR URINE DRUG SCREEN Drug Class                     Cutoff (ng/mL) Amphetamine and metabolites    1000 Barbiturate and metabolites    200 Benzodiazepine                 200 Tricyclics and metabolites     300 Opiates and metabolites  300 Cocaine and metabolites        300 THC                            50 Performed at [facility], [place] [address] [place], [place] [address]   Lipid panel     Status: Abnormal   Collection Time: 04/16/19  6:45 AM  Result Value Ref Range   Cholesterol 137 0 - 200 mg/dL   Triglycerides 109 <150 mg/dL   HDL 37 (L) >40 mg/dL   Total CHOL/HDL Ratio 3.7 RATIO   VLDL 22 0 - 40 mg/dL   LDL Cholesterol 78 0 - 99 mg/dL    Comment:        Total Cholesterol/HDL:CHD Risk Coronary Heart Disease Risk Table                     Men   Women  1/2 Average Risk   3.4   3.3  Average Risk       5.0   4.4  2 X Average Risk   9.6   7.1  3 X Average  Risk  23.4   11.0        Use the calculated Patient Ratio above and the CHD Risk Table to determine the patient's CHD Risk.        ATP III CLASSIFICATION (LDL):  <100     mg/dL   Optimal  100-129  mg/dL   Near or Above                    Optimal  130-159  mg/dL   Borderline  160-189  mg/dL   High  >190     mg/dL   Very High Performed at [facility] [address] [place], [place] [address]   TSH     Status: None   Collection Time: 04/16/19  6:45 AM  Result Value Ref Range   TSH 0.723 0.350 - 4.500 uIU/mL    Comment: Performed by a 3rd Generation assay with a functional sensitivity of <=0.01 uIU/mL. Performed at [facility], [place] [address], [place], [place] [address]   Hemoglobin A1c     Status: Abnormal   Collection Time: 04/16/19  6:45 AM  Result Value Ref Range   Hgb A1c MFr Bld 5.8 (H) 4.8 - 5.6 %    Comment: (NOTE) Pre diabetes:          5.7%-6.4% Diabetes:              >6.4% Glycemic control for   <7.0% adults with diabetes    Mean Plasma Glucose 119.76 mg/dL    Comment: Performed at [facility] [address], [place], [place] [address]    Blood Alcohol level:  Lab Results  Component Value Date   ETH <10 16/38/4665    Metabolic Disorder Labs: Lab Results  Component Value Date   HGBA1C 5.8 (H) 04/16/2019   MPG 119.76 04/16/2019   No results found for: PROLACTIN Lab Results  Component Value Date   CHOL 137 04/16/2019   TRIG 109 04/16/2019   HDL 37 (L) 04/16/2019   CHOLHDL 3.7 04/16/2019   VLDL 22 04/16/2019   LDLCALC 78 04/16/2019   LDLCALC 79 08/22/2016    Physical Findings: AIMS:  , ,  ,  ,    CIWA:    COWS:     Musculoskeletal: Strength &  Muscle Tone: within normal limits Gait & Station: normal Patient leans: N/A  Psychiatric Specialty Exam: Physical Exam  Nursing note and vitals reviewed. Constitutional: She is oriented to person, place, and time. She appears well-developed and  well-nourished.  Cardiovascular: Normal rate.  Respiratory: Effort normal.  Neurological: She is alert and oriented to person, place, and time.    Review of Systems  Constitutional: Negative.   Respiratory: Negative for cough and shortness of breath.   Psychiatric/Behavioral: Positive for sleep disturbance. Negative for agitation, behavioral problems, dysphoric mood, hallucinations, self-injury and suicidal ideas. The patient is not nervous/anxious and is not hyperactive.     Blood pressure (!) 147/88, pulse 93, temperature 98.1 F (36.7 C), temperature source Oral, resp. rate 20, height 5\' 6"  (1.676 m), weight (!) 176.4 kg, SpO2 99 %.Body mass index is 62.79 kg/m.  General Appearance: Casual  Eye Contact:  Good  Speech:  Normal Rate  Volume:  Normal  Mood:  Euthymic  Affect:  Congruent  Thought Process:  Coherent  Orientation:  Full (Time, Place, and Person)  Thought Content:  Delusions  Suicidal Thoughts:  No  Homicidal Thoughts:  No  Memory:  Immediate;   Fair Recent;   Fair  Judgement:  Fair  Insight:  Lacking  Psychomotor Activity:  Normal  Concentration:  Concentration: Fair and Attention Span: Fair  Recall:  [employer] of Knowledge:  Fair  Language:  Good  Akathisia:  No  Handed:  Right  AIMS (if indicated):     Assets:  Communication Skills Leisure Time Resilience  ADL's:  Intact  Cognition:  WNL  Sleep:  Number of Hours: 3.75     Treatment Plan Summary: Daily contact with patient to assess and evaluate symptoms and progress in treatment and Medication management   Continue inpatient hospitalization.  Adjust Risperdal to 1 mg PO QAM, 4 mg PO QHS for psychosis/mania Continue Haldol 10 mg IM PRN agitation or refusal oral Risperdal Continue Cogentin 1 mg PO BID for EPS Continue Duoneb 3 mL Q6HR for asthma Continue levalbuterol inhaler 2 puffs Q8HR for asthma Continue Breo Ellipta 1 puff daily for asthma Continue Protonix 40 mg PO daily for GERD Continue  propanolol 10 mg PO BID for anxiety Continue Restoril 30 mg PO QHS for insomnia Continue trazodone 100 mg PO QHS PRN insomnia  Patient will participate in the therapeutic group milieu.  Discharge disposition in progress.   Aldean Baker, NP 04/16/2019, 8:47 AM

## 2019-04-17 LAB — BASIC METABOLIC PANEL
Anion gap: 10 (ref 5–15)
BUN: 12 mg/dL (ref 6–20)
CO2: 25 mmol/L (ref 22–32)
Calcium: 8.9 mg/dL (ref 8.9–10.3)
Chloride: 102 mmol/L (ref 98–111)
Creatinine, Ser: 0.85 mg/dL (ref 0.44–1.00)
GFR calc Af Amer: >60 mL/min (ref >60)
GFR calc non Af Amer: >60 mL/min (ref >60)
Glucose, Bld: 119 mg/dL — ABNORMAL HIGH (ref 70–99)
Potassium: 3.6 mmol/L (ref 3.5–5.1)
Sodium: 137 mmol/L (ref 135–145)

## 2019-04-17 NOTE — [facility] Group Notes (Addendum)
[facility] LCSW Group Therapy Note  Date/Time:  04/17/2019  11:10AM-12:00PM  Type of Therapy and Topic:  Group Therapy:  Music and Mood  Participation Level:  Did Not Attend   Description of Group: In this process group, members listened to a variety of genres of music and identified that different types of music evoke different responses.  Patients were encouraged to identify music that was soothing for them and music that was energizing for them.  Patients discussed how this knowledge can help with wellness and recovery in various ways including managing depression and anxiety as well as encouraging healthy sleep habits.    Therapeutic Goals: 1. Patients will explore the impact of different varieties of music on mood 2. Patients will verbalize the thoughts they have when listening to different types of music 3. Patients will identify music that is soothing to them as well as music that is energizing to them 4. Patients will discuss how to use this knowledge to assist in maintaining wellness and recovery 5. Patients will explore the use of music as a coping skill  Summary of Patient Progress:  Did Not Attend - was invited individually by [facility] and chose not to attend.   Cyril Loosen, LCSWA 04/17/2019, 1:02 PM

## 2019-04-17 NOTE — Progress Notes (Addendum)
[facility] MD Progress Note  04/17/2019 10:23 AM Alyssa Blanchard  MRN:  [id number] Subjective:  "I'm tired."  Alyssa Blanchard found lying in bed. She appears fatigued and mildly irritable. Per nursing report she was up overnight and agitated. 0.75 hours of sleep recorded overnight. She is sleeping in bed this morning and falls asleep during assessment. She continues to present with poor insight, stating "I only needed a cough drop. Why do you keep giving me these medications?" No cough noted. Patient states she was trying to sleep overnight but "the staff kept waking me up." Per staff report she was out of bed frequently overnight making multiple requests. She refused oral Risperdal last night and received forced med order Haldol 10 mg IM at 0323. She denies SI/HI/AVH. She is difficult to assess due to sedation this morning.  From admission assessment: Alyssa Blanchard is an 49 y.o. female brought in EMS due to erratic behaviors. Patient presenting to ED extremely irrational, hyperventilating and acutely psychotic. Patient has little information. When asked, why are you here, patient stated "the movie of my life not order".   Principal Problem: Bipolar I disorder, most recent episode (or current) manic (HCC) Diagnosis: Principal Problem:   Bipolar I disorder, most recent episode (or current) manic (HCC) Active Problems:   Psychosis (HCC)  Total Time spent with patient: 15 minutes  Past Psychiatric History: See admission H&P  Past Medical History:  Past Medical History:  Diagnosis Date   Anxiety    Asthma    Depression    GERD (gastroesophageal reflux disease)    Heel spur    bilat   History of bronchitis    History of chicken pox    History of urinary tract infection    Migraines    Plantar fasciitis    bilat   STD (sexually transmitted disease)    chl hx & hsv 1&2   Tremors of nervous system    Urinary incontinence     Past Surgical History:  Procedure Laterality Date   LAPAROSCOPIC GASTRIC  SLEEVE RESECTION N/A 11/27/2014   Procedure: LAPAROSCOPIC GASTRIC SLEEVE RESECTION WITH HIATAL HERNIA REPAIR UPPER ENDOSCOPY;  Surgeon: Luretha Murphy, MD;  Location: [facility];  Service: General;  Laterality: N/A;   TONSILLECTOMY  1978   Family History:  Family History  Problem Relation Age of Onset   Diabetes Mother    Hypertension Mother    Hyperlipidemia Mother    Kidney disease Mother    Cancer Father        pancreatic   Hypertension Maternal Grandmother    Diabetes Maternal Grandmother    Heart failure Maternal Grandmother        CHF   Heart attack Maternal Grandfather    Hypertension Maternal Grandfather    Hypertension Paternal Grandmother    Alzheimer's disease Paternal Grandmother    Hypertension Paternal Grandfather    Cancer Maternal Uncle        melanoma   Cancer Paternal Uncle        kidney   Family Psychiatric  History: See admission H&P Social History:  Social History   Substance and Sexual Activity  Alcohol Use Yes   Alcohol/week: 0.0 standard drinks   Comment: 1 a month     Social History   Substance and Sexual Activity  Drug Use Yes   Types: Marijuana, Cocaine    Social History   Socioeconomic History   Marital status: Single    Spouse name: Not on file   Number of  children: Not on file   Years of education: 12   Highest education level: Not on file  Occupational History   Occupation: Cosmetics    Employer: [employer]  Tobacco Use   Smoking status: Former Smoker    Packs/day: 0.25    Years: 5.00    Pack years: 1.25    Types: Cigarettes    Quit date: 03/10/2012    Years since quitting: 7.1   Smokeless tobacco: Never Used  Substance and Sexual Activity   Alcohol use: Yes    Alcohol/week: 0.0 standard drinks    Comment: 1 a month   Drug use: Yes    Types: Marijuana, Cocaine   Sexual activity: Yes    Partners: Male    Birth control/protection: Condom  Other Topics Concern   Not on file  Social History Narrative   Regular  exercise-no   Caffeine Use-yes, 1-2 cups of caffeine daily   Social Determinants of Health   Financial Resource Strain:    Difficulty of Paying Living Expenses: Not on file  Food Insecurity:    Worried About Charity fundraiser in the Last Year: Not on file   [employer] of Food in the Last Year: Not on file  Transportation Needs:    Lack of Transportation (Medical): Not on file   Lack of Transportation (Non-Medical): Not on file  Physical Activity:    Days of Exercise per Week: Not on file   Minutes of Exercise per Session: Not on file  Stress:    Feeling of Stress : Not on file  Social Connections:    Frequency of Communication with Friends and Family: Not on file   Frequency of Social Gatherings with Friends and Family: Not on file   Attends Religious Services: Not on file   Active Member of Clubs or Organizations: Not on file   Attends Archivist Meetings: Not on file   Marital Status: Not on file   Additional Social History:                         Sleep: Poor  Appetite:  Fair  Current Medications: Current Facility-Administered Medications  Medication Dose Route Frequency Provider Last Rate Last Admin   acetaminophen (TYLENOL) tablet 650 mg  650 mg Oral Q6H PRN Emmaline Kluver, FNP   650 mg at 04/17/19 0826   albuterol (VENTOLIN HFA) 108 (90 Base) MCG/ACT inhaler 1 puff  1 puff Inhalation Q6H PRN Emmaline Kluver, FNP       alum & mag hydroxide-simeth (MAALOX/MYLANTA) 200-200-20 MG/5ML suspension 30 mL  30 mL Oral Q4H PRN Emmaline Kluver, FNP       benztropine (COGENTIN) tablet 1 mg  1 mg Oral BID Johnn Hai, MD   1 mg at 04/17/19 0826   fluticasone furoate-vilanterol (BREO ELLIPTA) 200-25 MCG/INH 1 puff  1 puff Inhalation Daily Nwoko, Herbert Pun I, NP   1 puff at 04/15/19 1530   haloperidol lactate (HALDOL) injection 10 mg  10 mg Intramuscular BID PRN Johnn Hai, MD   10 mg at 04/17/19 0323   haloperidol lactate (HALDOL) injection 5 mg  5 mg Intramuscular Once  Johnn Hai, MD       ipratropium-albuterol (DUONEB) 0.5-2.5 (3) MG/3ML nebulizer solution 3 mL  3 mL Nebulization Q6H Emmaline Kluver, FNP       levalbuterol (XOPENEX HFA) inhaler 2 puff  2 puff Inhalation Q8H Lindell Spar I, NP   2  puff at 04/17/19 0834   LORazepam (ATIVAN) injection 2 mg  2 mg Intramuscular Once Malvin Johns, MD       magnesium hydroxide (MILK OF MAGNESIA) suspension 30 mL  30 mL Oral Daily PRN Patrcia Dolly, FNP       pantoprazole (PROTONIX) EC tablet 40 mg  40 mg Oral Daily Armandina Stammer I, NP   40 mg at 04/17/19 8101   propranolol (INDERAL) tablet 10 mg  10 mg Oral BID Malvin Johns, MD   10 mg at 04/17/19 7510   risperiDONE (RISPERDAL) tablet 1 mg  1 mg Oral Daily Aldean Baker, NP   1 mg at 04/16/19 0941   risperiDONE (RISPERDAL) tablet 4 mg  4 mg Oral QHS Aldean Baker, NP       sodium chloride (OCEAN) 0.65 % nasal spray 1 spray  1 spray Each Nare PRN Jearld Lesch, NP   1 spray at 04/17/19 0855   temazepam (RESTORIL) capsule 30 mg  30 mg Oral QHS Malvin Johns, MD   30 mg at 04/16/19 2108   traZODone (DESYREL) tablet 100 mg  100 mg Oral QHS PRN,MR X 1 Dixon, Rashaun M, NP   100 mg at 04/16/19 2108    Lab Results:  Results for orders placed or performed during the hospital encounter of 04/14/19 (from the past 48 hour(s))  Urinalysis, Routine w reflex microscopic     Status: Abnormal   Collection Time: 04/15/19  1:55 PM  Result Value Ref Range   Color, Urine YELLOW YELLOW   APPearance HAZY (A) CLEAR   Specific Gravity, Urine 1.014 1.005 - 1.030   pH 6.0 5.0 - 8.0   Glucose, UA NEGATIVE NEGATIVE mg/dL   Hgb urine dipstick NEGATIVE NEGATIVE   Bilirubin Urine NEGATIVE NEGATIVE   Ketones, ur 20 (A) NEGATIVE mg/dL   Protein, ur NEGATIVE NEGATIVE mg/dL   Nitrite NEGATIVE NEGATIVE   Leukocytes,Ua NEGATIVE NEGATIVE    Comment: Performed at [facility], [address] [place], [place] [address]  Urine rapid drug screen (hosp performed)     Status:  Abnormal   Collection Time: 04/15/19  1:55 PM  Result Value Ref Range   Opiates NONE DETECTED NONE DETECTED   Cocaine NONE DETECTED NONE DETECTED   Benzodiazepines POSITIVE (A) NONE DETECTED   Amphetamines NONE DETECTED NONE DETECTED   Tetrahydrocannabinol NONE DETECTED NONE DETECTED   Barbiturates NONE DETECTED NONE DETECTED    Comment: (NOTE) DRUG SCREEN FOR MEDICAL PURPOSES ONLY.  IF CONFIRMATION IS NEEDED FOR ANY PURPOSE, NOTIFY LAB WITHIN 5 DAYS. LOWEST DETECTABLE LIMITS FOR URINE DRUG SCREEN Drug Class                     Cutoff (ng/mL) Amphetamine and metabolites    1000 Barbiturate and metabolites    200 Benzodiazepine                 200 Tricyclics and metabolites     300 Opiates and metabolites        300 Cocaine and metabolites        300 THC                            50 Performed at [facility], [address] [place], [place] [address]   Lipid panel     Status: Abnormal   Collection Time: 04/16/19  6:45 AM  Result Value Ref  Range   Cholesterol 137 0 - 200 mg/dL   Triglycerides 144 <315 mg/dL   HDL 37 (L) >40 mg/dL   Total CHOL/HDL Ratio 3.7 RATIO   VLDL 22 0 - 40 mg/dL   LDL Cholesterol 78 0 - 99 mg/dL    Comment:        Total Cholesterol/HDL:CHD Risk Coronary Heart Disease Risk Table                     Men   Women  1/2 Average Risk   3.4   3.3  Average Risk       5.0   4.4  2 X Average Risk   9.6   7.1  3 X Average Risk  23.4   11.0        Use the calculated Patient Ratio above and the CHD Risk Table to determine the patient's CHD Risk.        ATP III CLASSIFICATION (LDL):  <100     mg/dL   Optimal  086-761  mg/dL   Near or Above                    Optimal  130-159  mg/dL   Borderline  950-932  mg/dL   High  >671     mg/dL   Very High Performed at [facility], [address], [place], [place] [address]   TSH     Status: None   Collection Time: 04/16/19  6:45 AM  Result Value Ref Range   TSH 0.723  0.350 - 4.500 uIU/mL    Comment: Performed by a 3rd Generation assay with a functional sensitivity of <=0.01 uIU/mL. Performed at [facility], [address], [place], [place] [address]   Hemoglobin A1c     Status: Abnormal   Collection Time: 04/16/19  6:45 AM  Result Value Ref Range   Hgb A1c MFr Bld 5.8 (H) 4.8 - 5.6 %    Comment: (NOTE) Pre diabetes:          5.7%-6.4% Diabetes:              >6.4% Glycemic control for   <7.0% adults with diabetes    Mean Plasma Glucose 119.76 mg/dL    Comment: Performed at [facility], [address], [place], [place] [address]  Basic metabolic panel     Status: Abnormal   Collection Time: 04/17/19  6:39 AM  Result Value Ref Range   Sodium 137 135 - 145 mmol/L   Potassium 3.6 3.5 - 5.1 mmol/L   Chloride 102 98 - 111 mmol/L   CO2 25 22 - 32 mmol/L   Glucose, Bld 119 (H) 70 - 99 mg/dL   BUN 12 6 - 20 mg/dL   Creatinine, Ser 3.97 0.44 - 1.00 mg/dL   Calcium 8.9 8.9 - 67.3 mg/dL   GFR calc non Af Amer >60 >60 mL/min   GFR calc Af Amer >60 >60 mL/min   Anion gap 10 5 - 15    Comment: Performed at [facility], [address], [place], [place] [address]    Blood Alcohol level:  Lab Results  Component Value Date   [facility] <10 04/13/2019    Metabolic Disorder Labs: Lab Results  Component Value Date   HGBA1C 5.8 (H) 04/16/2019   MPG 119.76 04/16/2019   No results found for: PROLACTIN Lab Results  Component Value Date   CHOL 137 04/16/2019  TRIG 109 04/16/2019   HDL 37 (L) 04/16/2019   CHOLHDL 3.7 04/16/2019   VLDL 22 04/16/2019   LDLCALC 78 04/16/2019   LDLCALC 79 08/22/2016    Physical Findings: AIMS:  , ,  ,  ,    CIWA:    COWS:     Musculoskeletal: Strength & Muscle Tone: within normal limits Gait & Station: normal Patient leans: N/A  Psychiatric Specialty Exam: Physical Exam  Nursing note and vitals reviewed. Constitutional: She is oriented to person, place, and time. She  appears well-developed and well-nourished.  Cardiovascular: Normal rate.  Respiratory: Effort normal.  Neurological: She is alert and oriented to person, place, and time.    Review of Systems  Constitutional: Negative.   Respiratory: Negative for cough and shortness of breath.   Psychiatric/Behavioral: Positive for agitation, behavioral problems and sleep disturbance. Negative for dysphoric mood, hallucinations, self-injury and suicidal ideas. The patient is not nervous/anxious and is not hyperactive.     Blood pressure (!) 143/97, pulse 92, temperature 98.2 F (36.8 C), temperature source Oral, resp. rate 20, height 5\' 6"  (1.676 m), weight (!) 176.4 kg, SpO2 98 %.Body mass index is 62.79 kg/m.  General Appearance: Disheveled  Eye Contact:  Poor  Speech:  Slow  Volume:  Decreased  Mood:  Irritable  Affect:  Constricted  Thought Process:  Coherent  Orientation:  Full (Time, Place, and Person)  Thought Content:  Delusions  Suicidal Thoughts:  No  Homicidal Thoughts:  No  Memory:  Immediate;   Poor Recent;   Poor  Judgement:  Impaired  Insight:  Lacking  Psychomotor Activity:  Decreased  Concentration:  Concentration: Fair and Attention Span: Fair  Recall:  of Knowledge:  Fair  Language:  Good  Akathisia:  No  Handed:  Right  AIMS (if indicated):     Assets:  Communication Skills Leisure Time Resilience  ADL's:  Intact  Cognition:  WNL  Sleep:  Number of Hours: 0.75     Treatment Plan Summary: Daily contact with patient to assess and evaluate symptoms and progress in treatment and Medication management   Continue inpatient hospitalization.   Continue Risperdal 1 mg PO QAM, 4 mg PO QHS for psychosis/mania Continue Haldol 10 mg IM PRN agitation/refusal oral Risperdal Continue Cogentin 1 mg PO BID for EPS Continue Duoneb 3 mL Q6HR for asthma Continue levalbuterol inhaler 2 puffs Q8HR for asthma Continue Breo Ellipta 1 puff daily for asthma Continue  Protonix 40 mg PO daily for GERD Continue propanolol 10 mg PO BID for anxiety Continue Restoril 30 mg PO QHS for insomnia Continue trazodone 100 mg PO QHS PRN insomnia   Patient will participate in the therapeutic group milieu.   Discharge disposition in progress.   [employer], NP 04/17/2019, 10:23 AM   Attest to NP Note

## 2019-04-17 NOTE — Progress Notes (Signed)
Patient has been up and down tonight not sleeping and constantly asking for air to be adjusted, blankets, cough drop and patient has not been coughing. Patient had refused risperdal at bedtime because she reports that it makes her feel like she is having a panic attack. After patient kept requesting things and wanting to be argumentative with staff when limits were set she received Haldol 10 mg IM since not able to tolerate risperdal. Patient informed of order for this medication.

## 2019-04-17 NOTE — Progress Notes (Addendum)
   04/17/19 0900  Psych Admission Type (Psych Patients Only)  Admission Status Involuntary  Psychosocial Assessment  Patient Complaints Anxiety  Eye Contact Fair  Facial Expression Anxious  Affect Appropriate to circumstance  Speech Logical/coherent  Interaction Assertive  Motor Activity Restless  Appearance/Hygiene Improved  Behavior Characteristics Cooperative;Anxious  Mood Anxious  Thought Process  Coherency WDL  Content Blaming others  Delusions None reported or observed  Perception WDL  Hallucination None reported or observed  Judgment WDL  Confusion WDL  Danger to Self  Current suicidal ideation? Denies  Danger to Others  Danger to Others None reported or observed    Pt appeared 'groggy' upon initial approach at med window this am- Pt refused her Risperdal  complaining that she didn't like the way it made her feel. Pt reports fair sleep, good appetite, and normal energy on pt's self inventory, and rated her depression, hopelessness and anxiety an 10/15/08, respectively, but writes that "it's improving since last night".  Pt endorsing back pain an 8/10 this am- pt reports tylenol offered little relief. Pt currently denies SI/HI and A/V H. Patient remains safe on the unit with Q 15 min checks.

## 2019-04-17 NOTE — Progress Notes (Addendum)
   04/16/19 2110  Psych Admission Type (Psych Patients Only)  Admission Status Involuntary  Psychosocial Assessment  Patient Complaints Anxiety  Eye Contact Fair  Facial Expression Anxious  Affect Appropriate to circumstance  Speech Logical/coherent  Interaction Assertive;Forwards little  Motor Activity Restless  Appearance/Hygiene Improved  Behavior Characteristics Restless;Anxious  Mood Anxious;Labile  Thought Process  Coherency WDL  Content Blaming others  Delusions None reported or observed  Perception WDL  Hallucination None reported or observed  Judgment WDL  Confusion WDL  Danger to Self  Current suicidal ideation? Denies  Danger to Others  Danger to Others None reported or observed    Patient refused risperdal at bedtime.

## 2019-04-17 NOTE — [facility] Group Notes (Signed)
Adult Psychoeducational Group Note  Date:  04/17/2019 Time:  11:52 AM  Group Topic/Focus:  Orientation:   The focus of this group is to educate the patient on the purpose and policies of crisis stabilization and provide a format to answer questions about their admission.  The group details unit policies and expectations of patients while admitted. Spirituality:   The focus of this group is to discuss how one's spirituality can aide in recovery.  Participation Level:  Did Not Attend   Dione Housekeeper 04/17/2019, 11:52 AM

## 2019-04-18 LAB — PROLACTIN: Prolactin: 50.4 ng/mL — ABNORMAL HIGH (ref 4.8–23.3)

## 2019-04-18 MED ORDER — RISPERIDONE 2 MG PO TABS
4.0000 mg | ORAL_TABLET | Freq: Two times a day (BID) | ORAL | Status: DC
  Administered 2019-04-18: 17:00:00 4 mg via ORAL
  Filled 2019-04-18 (×4): qty 2

## 2019-04-18 MED ORDER — CLONAZEPAM 1 MG PO TABS
2.0000 mg | ORAL_TABLET | Freq: Every day | ORAL | Status: DC
  Administered 2019-04-18 – 2019-04-19 (×2): 2 mg via ORAL
  Filled 2019-04-18 (×2): qty 2

## 2019-04-18 NOTE — Progress Notes (Signed)
Recreation Therapy Notes  Date: 2.8.21 Time: 1000 Location: [address] Dayroom  Group Topic: Wellness  Goal Area(s) Addresses:  Patient will define components of whole wellness. Patient will verbalize benefit of whole wellness.  Behavioral Response: Engaged  Intervention: Exercise, Music  Activity: Exercise.  LRT led group in a series of stretches to get loosened up.  Patients then took turns leading the group in exercises of their choosing.  Patients were told to pay attention to their bodies and to take breaks if needed.  Education: Wellness, Building control surveyor.   Education Outcome: Acknowledges education/In group clarification offered/Needs additional education.   Clinical Observations/Feedback: Pt was bright and engaged and completed the exercises to the best of her abilities.  Pt completed modified versions of exercises as needed.  Pt stayed with activity until the end.    Caroll Rancher, LRT/CTRS    Lillia Abed, Rhema Boyett A 04/18/2019 10:51 AM

## 2019-04-18 NOTE — Progress Notes (Signed)
Patient complained of having to take Risperdal as her psychiatric medications.  Patient stated that when she takes Risperdal that she feels her anxiety increases and she would like her medication changed to Abilify. Patient was complaint with the Risperdal because she understood she had to take it or else get forced medications but she would like for the physician to re-evaluate her medication profile.

## 2019-04-18 NOTE — Progress Notes (Signed)
   04/18/19 2028  Psych Admission Type (Psych Patients Only)  Admission Status Involuntary  Psychosocial Assessment  Patient Complaints None  Eye Contact Fair  Facial Expression Anxious  Affect Appropriate to circumstance  Speech Logical/coherent  Interaction Assertive  Motor Activity Slow  Appearance/Hygiene Unremarkable  Behavior Characteristics Cooperative  Mood Depressed  Thought Process  Coherency WDL  Content WDL  Delusions None reported or observed  Perception WDL  Hallucination None reported or observed  Judgment WDL  Confusion None  Danger to Self  Current suicidal ideation? Denies  Danger to Others  Danger to Others None reported or observed   Pt sitting in dayroom watching television. Just woke up. Pt denies SI, HI, AVH. Has pain 8/10 in her back.

## 2019-04-18 NOTE — Progress Notes (Signed)
   04/17/19 2140  Psych Admission Type (Psych Patients Only)  Admission Status Involuntary  Psychosocial Assessment  Patient Complaints Other (Comment) (pt c/o medications ordered)  Eye Contact Fair  Facial Expression Anxious  Affect Appropriate to circumstance  Speech Logical/coherent  Interaction Assertive  Motor Activity Restless  Appearance/Hygiene Improved  Behavior Characteristics Irritable;Impulsive  Thought Process  Coherency WDL  Content Blaming others  Delusions None reported or observed  Perception WDL  Hallucination None reported or observed  Judgment WDL  Confusion WDL  Danger to Self  Current suicidal ideation? Denies  Danger to Others  Danger to Others None reported or observed

## 2019-04-18 NOTE — [facility] Suicide Risk Assessment (Addendum)
[facility] INPATIENT:  Family/Significant Other Suicide Prevention Education  Suicide Prevention Education:  Contact Attempts: Pt's husband, Frutoso Schatz [phone]), has been identified by the patient as the family member/significant other with whom the patient will be residing, and identified as the person(s) who will aid the patient in the event of a mental health crisis.  With written consent from the patient, two attempts were made to provide suicide prevention education, prior to and/or following the patient's discharge.  We were unsuccessful in providing suicide prevention education.  A suicide education pamphlet was given to the patient to share with family/significant other.  Date and time of first attempt: 02/08 @ 2:58pm (CSW left a HIPPA compliant VM) Date and time of second attempt: CSW learned from Pt's doctor and nurse that Frutoso Schatz is not her husband and he is a delusional thought.   Alyssa Blanchard 04/18/2019, 2:58 PM

## 2019-04-18 NOTE — Progress Notes (Signed)
[facility] MD Progress Note  04/18/2019 11:35 AM Alyssa Blanchard  MRN:  [id number] Subjective:   Patient sleeping poorly she is refusing meds and continues to give inconsistent information about her living situation  Still in a manic state but is behaviorally contained after receiving some IM medications but not consistently through the weekend.  Denies thoughts of harming self or others wants to be discharged but again gives contradictory and vague information regarding housing Principal Problem: Bipolar I disorder, most recent episode (or current) manic (HCC) Diagnosis: Principal Problem:   Bipolar I disorder, most recent episode (or current) manic (HCC) Active Problems:   Psychosis (HCC)  Total Time spent with patient: 15 minutes  Past Psychiatric History: Chronic undertreated untreated bipolar disorder  Past Medical History:  Past Medical History:  Diagnosis Date  . Anxiety   . Asthma   . Depression   . GERD (gastroesophageal reflux disease)   . Heel spur    bilat  . History of bronchitis   . History of chicken pox   . History of urinary tract infection   . Migraines   . Plantar fasciitis    bilat  . STD (sexually transmitted disease)    chl hx & hsv 1&2  . Tremors of nervous system   . Urinary incontinence     Past Surgical History:  Procedure Laterality Date  . LAPAROSCOPIC GASTRIC SLEEVE RESECTION N/A 11/27/2014   Procedure: LAPAROSCOPIC GASTRIC SLEEVE RESECTION WITH HIATAL HERNIA REPAIR UPPER ENDOSCOPY;  Surgeon: Luretha Murphy, MD;  Location: [facility];  Service: General;  Laterality: N/A;  . TONSILLECTOMY  1978   Family History:  Family History  Problem Relation Age of Onset  . Diabetes Mother   . Hypertension Mother   . Hyperlipidemia Mother   . Kidney disease Mother   . Cancer Father        pancreatic  . Hypertension Maternal Grandmother   . Diabetes Maternal Grandmother   . Heart failure Maternal Grandmother        CHF  . Heart attack Maternal Grandfather   .  Hypertension Maternal Grandfather   . Hypertension Paternal Grandmother   . Alzheimer's disease Paternal Grandmother   . Hypertension Paternal Grandfather   . Cancer Maternal Uncle        melanoma  . Cancer Paternal Uncle        kidney   Family Psychiatric  History: See eval Social History:  Social History   Substance and Sexual Activity  Alcohol Use Yes  . Alcohol/week: 0.0 standard drinks   Comment: 1 a month     Social History   Substance and Sexual Activity  Drug Use Yes  . Types: Marijuana, Cocaine    Social History   Socioeconomic History  . Marital status: Single    Spouse name: Not on file  . Number of children: Not on file  . Years of education: 81  . Highest education level: Not on file  Occupational History  . Occupation: Chartered certified accountant: [employer]  Tobacco Use  . Smoking status: Former Smoker    Packs/day: 0.25    Years: 5.00    Pack years: 1.25    Types: Cigarettes    Quit date: 03/10/2012    Years since quitting: 7.1  . Smokeless tobacco: Never Used  Substance and Sexual Activity  . Alcohol use: Yes    Alcohol/week: 0.0 standard drinks    Comment: 1 a month  . Drug use: Yes  Types: Marijuana, Cocaine  . Sexual activity: Yes    Partners: Male    Birth control/protection: Condom  Other Topics Concern  . Not on file  Social History Narrative   Regular exercise-no   Caffeine Use-yes, 1-2 cups of caffeine daily   Social Determinants of Health   Financial Resource Strain:   . Difficulty of Paying Living Expenses: Not on file  Food Insecurity:   . Worried About Programme researcher, broadcasting/film/video in the Last Year: Not on file  . Ran Out of Food in the Last Year: Not on file  Transportation Needs:   . Lack of Transportation (Medical): Not on file  . Lack of Transportation (Non-Medical): Not on file  Physical Activity:   . Days of Exercise per Week: Not on file  . Minutes of Exercise per Session: Not on file  Stress:   . Feeling of Stress :  Not on file  Social Connections:   . Frequency of Communication with Friends and Family: Not on file  . Frequency of Social Gatherings with Friends and Family: Not on file  . Attends Religious Services: Not on file  . Active Member of Clubs or Organizations: Not on file  . Attends Banker Meetings: Not on file  . Marital Status: Not on file   Additional Social History:                         Sleep: Poor  Appetite:  Fair  Current Medications: Current Facility-Administered Medications  Medication Dose Route Frequency Provider Last Rate Last Admin  . acetaminophen (TYLENOL) tablet 650 mg  650 mg Oral Q6H PRN Patrcia Dolly, FNP   650 mg at 04/17/19 1610  . albuterol (VENTOLIN HFA) 108 (90 Base) MCG/ACT inhaler 1 puff  1 puff Inhalation Q6H PRN Patrcia Dolly, FNP      . alum & mag hydroxide-simeth (MAALOX/MYLANTA) 200-200-20 MG/5ML suspension 30 mL  30 mL Oral Q4H PRN Patrcia Dolly, FNP      . benztropine (COGENTIN) tablet 1 mg  1 mg Oral BID Malvin Johns, MD   1 mg at 04/17/19 1749  . fluticasone furoate-vilanterol (BREO ELLIPTA) 200-25 MCG/INH 1 puff  1 puff Inhalation Daily Nwoko, Agnes I, NP   1 puff at 04/18/19 0751  . haloperidol lactate (HALDOL) injection 10 mg  10 mg Intramuscular BID PRN Malvin Johns, MD   10 mg at 04/17/19 2133  . haloperidol lactate (HALDOL) injection 5 mg  5 mg Intramuscular Once Malvin Johns, MD      . ipratropium-albuterol (DUONEB) 0.5-2.5 (3) MG/3ML nebulizer solution 3 mL  3 mL Nebulization Q6H Patrcia Dolly, FNP   3 mL at 04/17/19 1700  . levalbuterol (XOPENEX HFA) inhaler 2 puff  2 puff Inhalation Q8H Armandina Stammer I, NP   2 puff at 04/18/19 0636  . LORazepam (ATIVAN) injection 2 mg  2 mg Intramuscular Once Malvin Johns, MD      . magnesium hydroxide (MILK OF MAGNESIA) suspension 30 mL  30 mL Oral Daily PRN Patrcia Dolly, FNP      . pantoprazole (PROTONIX) EC tablet 40 mg  40 mg Oral Daily Armandina Stammer I, NP   40 mg at 04/18/19 9604  .  propranolol (INDERAL) tablet 10 mg  10 mg Oral BID Malvin Johns, MD   10 mg at 04/18/19 0752  . risperiDONE (RISPERDAL) tablet 1 mg  1 mg Oral Daily Aldean Baker, NP  1 mg at 04/16/19 0941  . risperiDONE (RISPERDAL) tablet 4 mg  4 mg Oral QHS Harriett Sine E, NP      . sodium chloride (OCEAN) 0.65 % nasal spray 1 spray  1 spray Each Nare PRN Deloria Lair, NP   1 spray at 04/17/19 0855  . temazepam (RESTORIL) capsule 30 mg  30 mg Oral QHS Johnn Hai, MD   30 mg at 04/17/19 2132  . traZODone (DESYREL) tablet 100 mg  100 mg Oral QHS PRN,MR X 1 Dixon, Rashaun M, NP   100 mg at 04/17/19 2132    Lab Results:  Results for orders placed or performed during the hospital encounter of 04/14/19 (from the past 48 hour(s))  Basic metabolic panel     Status: Abnormal   Collection Time: 04/17/19  6:39 AM  Result Value Ref Range   Sodium 137 135 - 145 mmol/L   Potassium 3.6 3.5 - 5.1 mmol/L   Chloride 102 98 - 111 mmol/L   CO2 25 22 - 32 mmol/L   Glucose, Bld 119 (H) 70 - 99 mg/dL   BUN 12 6 - 20 mg/dL   Creatinine, Ser 0.85 0.44 - 1.00 mg/dL   Calcium 8.9 8.9 - 10.3 mg/dL   GFR calc non Af Amer >60 >60 mL/min   GFR calc Af Amer >60 >60 mL/min   Anion gap 10 5 - 15    Comment: Performed at [facility], [place] [address], [place], [place] [address]    Blood Alcohol level:  Lab Results  Component Value Date   ETH <10 93/57/0177    Metabolic Disorder Labs: Lab Results  Component Value Date   HGBA1C 5.8 (H) 04/16/2019   MPG 119.76 04/16/2019   Lab Results  Component Value Date   PROLACTIN 50.4 (H) 04/16/2019   Lab Results  Component Value Date   CHOL 137 04/16/2019   TRIG 109 04/16/2019   HDL 37 (L) 04/16/2019   CHOLHDL 3.7 04/16/2019   VLDL 22 04/16/2019   LDLCALC 78 04/16/2019   LDLCALC 79 08/22/2016    Physical Findings: AIMS:  , ,  ,  ,    CIWA:    COWS:     Musculoskeletal: Strength & Muscle Tone: within normal limits Gait & Station:  normal Patient leans: N/A  Psychiatric Specialty Exam: Physical Exam  Review of Systems  Blood pressure (!) 146/85, pulse 93, temperature 98.2 F (36.8 C), temperature source Oral, resp. rate 20, height 5\' 6"  (1.676 m), weight (!) 176.4 kg, SpO2 98 %.Body mass index is 62.79 kg/m.  General Appearance: Casual  Eye Contact:  Fair  Speech:  Clear and Coherent  Volume:  Decreased  Mood:  Has had bouts of hypomania but generally able to contain her behavior  Affect:  Congruent  Thought Process:  Disorganized and Descriptions of Associations: Circumstantial  Orientation:  Other:  Person place situation not date  Thought Content:  Illogical and And somewhat disorganized  Suicidal Thoughts:  Denies  Homicidal Thoughts:  No  Memory:  Immediate;   Poor Recent;   Fair Remote;   Poor  Judgement:  Poor  Insight:  Shallow  Psychomotor Activity:  Normal  Concentration:  Concentration: Poor and Attention Span: Poor  Recall:  Poor  Fund of Knowledge:  Fair  Language:  Good  Akathisia:  Negative  Handed:  Right  AIMS (if indicated):     Assets:  Resilience Social Support  ADL's:  Intact  Cognition:  WNL  Sleep:  Number of Hours: 3.5     Treatment Plan Summary: Daily contact with patient to assess and evaluate symptoms and progress in treatment and Medication management  Add Sleep Aid confirmed with staff and need to force meds also discussed long-acting injectable  Malvin Johns, MD 04/18/2019, 11:35 AM

## 2019-04-19 MED ORDER — PANTOPRAZOLE SODIUM 40 MG PO TBEC
40.0000 mg | DELAYED_RELEASE_TABLET | Freq: Two times a day (BID) | ORAL | Status: DC
  Administered 2019-04-19 – 2019-04-20 (×2): 40 mg via ORAL
  Filled 2019-04-19 (×7): qty 1

## 2019-04-19 MED ORDER — RISPERIDONE 2 MG PO TABS
2.0000 mg | ORAL_TABLET | Freq: Two times a day (BID) | ORAL | Status: DC
  Administered 2019-04-19 – 2019-04-20 (×2): 2 mg via ORAL
  Filled 2019-04-19 (×6): qty 1

## 2019-04-19 MED ORDER — ARIPIPRAZOLE ER 400 MG IM SRER
400.0000 mg | INTRAMUSCULAR | Status: DC
  Administered 2019-04-19: 400 mg via INTRAMUSCULAR

## 2019-04-19 NOTE — Progress Notes (Signed)
   04/19/19 2346  Psych Admission Type (Psych Patients Only)  Admission Status Involuntary  Psychosocial Assessment  Patient Complaints None  Eye Contact Fair  Facial Expression Flat  Affect Appropriate to circumstance  Speech Logical/coherent  Interaction Assertive  Motor Activity Slow  Appearance/Hygiene Unremarkable  Behavior Characteristics Cooperative  Mood Depressed  Thought Process  Coherency WDL  Content WDL  Delusions None reported or observed  Perception WDL  Hallucination None reported or observed  Judgment WDL  Confusion None  Danger to Self  Current suicidal ideation? Denies  Danger to Others  Danger to Others None reported or observed   Pt sitting in dayroom. Pt denies SI, HI, AVH. Pt states pain 7/10 in her back and pain from edema in her legs and hand. Says it is a combination of the food and salt intake since she's been here that has led to her edema. Pt states she feels less sedated today and is ready for discharge tomorrow.

## 2019-04-19 NOTE — Progress Notes (Signed)
Pt states she feels oversedated this morning and "can't think." Pt states, "I had too much Risperdal yesterday." Pt also asked about Abilify. Says she took it before and it worked for her.

## 2019-04-19 NOTE — Progress Notes (Signed)
Recreation Therapy Notes  Date: 2.9.21 Time: 0940 Location: [address] Dayroom  Group Topic:  Goal Setting  Goal Area(s) Addresses:  Patient will identify goals they wish to set. Patient will identify benefit of setting goals. Patient will identify benefit of pursuing goals post d/c.  Behavioral Response:  Engaged  Intervention: Worksheet  Activity:  Garment/textile technologist.  Patient will identify goals they wish to accomplish in week, month, year and 5 years.  Patient then identifies any obstacles to reaching goals, what they need to achieve goals and what they can start doing now to work toward goals.   Education:  Discharge Planning, Coping Skills, Goal Planning  Education Outcome: Acknowledges Education/In Group Clarification Provided/Needs Additional Education  Clinical Observations: Pt stated in a week wanting to be discharged and work on herself so she won't return next time she has adversity or gets mad.  In a month, keep up with routine, visit primary care doctors and get checked out.  In a year, move into new house or buy land to move house to and get a new vehicle.  In 5 years, enjoy being married, taking care of her husband and playing with fur babies.  Pt identified obstacles as herself and not praying.  Things needed to achieve goals are prayer and getting a therapist to talk it out.  Pt stated she can start tomorrow to work towards goals by praying, waking up early, taking medications, follow a "to do list" and keep moving forward.     Caroll Rancher , LRT/CTRS    Lillia Abed, Marycruz Boehner A 04/19/2019 10:28 AM

## 2019-04-19 NOTE — Progress Notes (Addendum)
[facility] MD Progress Note  04/19/2019 7:53 AM Alyssa Blanchard  MRN:  [id number] Subjective:   Patient reports no issue sleeping after risperidone. She does report swollen feet and back pain, which she attributes to not having she therapeutic chair and bed, both currently at her mother's house.   She states she is "lethargic" since starting risperidone and appears somnolent.  Denies thoughts of harming self or others wants to be discharged but again gives contradictory and vague information regarding housing. She intends to stay in a hotel, with a friend, or follow up on housing options provided by staff. She is insist that she will not return to her mother's house.   Principal Problem: Bipolar I disorder, most recent episode (or current) manic (HCC) Diagnosis: Principal Problem:   Bipolar I disorder, most recent episode (or current) manic (HCC) Active Problems:   Psychosis (HCC)  Total Time spent with patient: 15 minutes  Past Psychiatric History: Chronic undertreated untreated bipolar disorder  Past Medical History:  Past Medical History:  Diagnosis Date  . Anxiety   . Asthma   . Depression   . GERD (gastroesophageal reflux disease)   . Heel spur    bilat  . History of bronchitis   . History of chicken pox   . History of urinary tract infection   . Migraines   . Plantar fasciitis    bilat  . STD (sexually transmitted disease)    chl hx & hsv 1&2  . Tremors of nervous system   . Urinary incontinence     Past Surgical History:  Procedure Laterality Date  . LAPAROSCOPIC GASTRIC SLEEVE RESECTION N/A 11/27/2014   Procedure: LAPAROSCOPIC GASTRIC SLEEVE RESECTION WITH HIATAL HERNIA REPAIR UPPER ENDOSCOPY;  Surgeon: Luretha Murphy, MD;  Location: [facility];  Service: General;  Laterality: N/A;  . TONSILLECTOMY  1978   Family History:  Family History  Problem Relation Age of Onset  . Diabetes Mother   . Hypertension Mother   . Hyperlipidemia Mother   . Kidney disease Mother   . Cancer  Father        pancreatic  . Hypertension Maternal Grandmother   . Diabetes Maternal Grandmother   . Heart failure Maternal Grandmother        CHF  . Heart attack Maternal Grandfather   . Hypertension Maternal Grandfather   . Hypertension Paternal Grandmother   . Alzheimer's disease Paternal Grandmother   . Hypertension Paternal Grandfather   . Cancer Maternal Uncle        melanoma  . Cancer Paternal Uncle        kidney   Family Psychiatric  History: See eval Social History:  Social History   Substance and Sexual Activity  Alcohol Use Yes  . Alcohol/week: 0.0 standard drinks   Comment: 1 a month     Social History   Substance and Sexual Activity  Drug Use Yes  . Types: Marijuana, Cocaine    Social History   Socioeconomic History  . Marital status: Single    Spouse name: Not on file  . Number of children: Not on file  . Years of education: 79  . Highest education level: Not on file  Occupational History  . Occupation: Chartered certified accountant: [employer]  Tobacco Use  . Smoking status: Former Smoker    Packs/day: 0.25    Years: 5.00    Pack years: 1.25    Types: Cigarettes    Quit date: 03/10/2012    Years since  quitting: 7.1  . Smokeless tobacco: Never Used  Substance and Sexual Activity  . Alcohol use: Yes    Alcohol/week: 0.0 standard drinks    Comment: 1 a month  . Drug use: Yes    Types: Marijuana, Cocaine  . Sexual activity: Yes    Partners: Male    Birth control/protection: Condom  Other Topics Concern  . Not on file  Social History Narrative   Regular exercise-no   Caffeine Use-yes, 1-2 cups of caffeine daily   Social Determinants of Health   Financial Resource Strain:   . Difficulty of Paying Living Expenses: Not on file  Food Insecurity:   . Worried About Programme researcher, broadcasting/film/video in the Last Year: Not on file  . Ran Out of Food in the Last Year: Not on file  Transportation Needs:   . Lack of Transportation (Medical): Not on file  . Lack  of Transportation (Non-Medical): Not on file  Physical Activity:   . Days of Exercise per Week: Not on file  . Minutes of Exercise per Session: Not on file  Stress:   . Feeling of Stress : Not on file  Social Connections:   . Frequency of Communication with Friends and Family: Not on file  . Frequency of Social Gatherings with Friends and Family: Not on file  . Attends Religious Services: Not on file  . Active Member of Clubs or Organizations: Not on file  . Attends Banker Meetings: Not on file  . Marital Status: Not on file   Additional Social History:                         Sleep: Poor  Appetite:  Fair  Current Medications: Current Facility-Administered Medications  Medication Dose Route Frequency Provider Last Rate Last Admin  . acetaminophen (TYLENOL) tablet 650 mg  650 mg Oral Q6H PRN Patrcia Dolly, FNP   650 mg at 04/18/19 2039  . albuterol (VENTOLIN HFA) 108 (90 Base) MCG/ACT inhaler 1 puff  1 puff Inhalation Q6H PRN Patrcia Dolly, FNP      . alum & mag hydroxide-simeth (MAALOX/MYLANTA) 200-200-20 MG/5ML suspension 30 mL  30 mL Oral Q4H PRN Patrcia Dolly, FNP      . benztropine (COGENTIN) tablet 1 mg  1 mg Oral BID Malvin Johns, MD   1 mg at 04/19/19 0717  . clonazePAM (KLONOPIN) tablet 2 mg  2 mg Oral QHS Malvin Johns, MD   2 mg at 04/18/19 2039  . fluticasone furoate-vilanterol (BREO ELLIPTA) 200-25 MCG/INH 1 puff  1 puff Inhalation Daily Nwoko, Agnes I, NP   1 puff at 04/19/19 0717  . haloperidol lactate (HALDOL) injection 10 mg  10 mg Intramuscular BID PRN Malvin Johns, MD   10 mg at 04/17/19 2133  . haloperidol lactate (HALDOL) injection 5 mg  5 mg Intramuscular Once Malvin Johns, MD      . ipratropium-albuterol (DUONEB) 0.5-2.5 (3) MG/3ML nebulizer solution 3 mL  3 mL Nebulization Q6H Patrcia Dolly, FNP   3 mL at 04/17/19 1700  . levalbuterol (XOPENEX HFA) inhaler 2 puff  2 puff Inhalation Q8H Armandina Stammer I, NP   2 puff at 04/19/19 [phone]  .  magnesium hydroxide (MILK OF MAGNESIA) suspension 30 mL  30 mL Oral Daily PRN Patrcia Dolly, FNP      . pantoprazole (PROTONIX) EC tablet 40 mg  40 mg Oral Daily Sanjuana Kava, NP   40  mg at 04/19/19 0644  . propranolol (INDERAL) tablet 10 mg  10 mg Oral BID Johnn Hai, MD   10 mg at 04/19/19 0719  . risperiDONE (RISPERDAL) tablet 4 mg  4 mg Oral BID Johnn Hai, MD   4 mg at 04/18/19 1711  . sodium chloride (OCEAN) 0.65 % nasal spray 1 spray  1 spray Each Nare PRN Deloria Lair, NP   1 spray at 04/19/19 0644  . traZODone (DESYREL) tablet 100 mg  100 mg Oral QHS PRN,MR X 1 Dixon, Rashaun M, NP   100 mg at 04/17/19 2132    Lab Results:  No results found for this or any previous visit (from the past 48 hour(s)).  Blood Alcohol level:  Lab Results  Component Value Date   ETH <10 97/04/6376    Metabolic Disorder Labs: Lab Results  Component Value Date   HGBA1C 5.8 (H) 04/16/2019   MPG 119.76 04/16/2019   Lab Results  Component Value Date   PROLACTIN 50.4 (H) 04/16/2019   Lab Results  Component Value Date   CHOL 137 04/16/2019   TRIG 109 04/16/2019   HDL 37 (L) 04/16/2019   CHOLHDL 3.7 04/16/2019   VLDL 22 04/16/2019   LDLCALC 78 04/16/2019   LDLCALC 79 08/22/2016    Physical Findings: AIMS:  , ,  ,  ,    CIWA:    COWS:     Musculoskeletal: Strength & Muscle Tone: within normal limits Gait & Station: normal Patient leans: N/A  Psychiatric Specialty Exam: Physical Exam  Review of Systems  Blood pressure (!) 134/96, pulse 95, temperature 98.4 F (36.9 C), temperature source Oral, resp. rate 20, height 5\' 6"  (1.676 m), weight (!) 176.4 kg, SpO2 98 %.Body mass index is 62.79 kg/m.  General Appearance: Casual  Eye Contact:  Fair  Speech:  Clear and Coherent  Volume:  Decreased  Mood:  Has had bouts of hypomania but generally able to contain her behavior  Affect:  Congruent  Thought Process:  Disorganized and Descriptions of Associations: Circumstantial   Orientation:  Other:  Person place situation not date  Thought Content:  Illogical and And somewhat disorganized  Suicidal Thoughts:  Denies  Homicidal Thoughts:  No  Memory:  Immediate;   Poor Recent;   Fair Remote;   Poor  Judgement:  Poor  Insight:  Shallow  Psychomotor Activity:  Normal  Concentration:  Concentration: Poor and Attention Span: Poor  Recall:  Poor  Fund of Knowledge:  Fair  Language:  Good  Akathisia:  Negative  Handed:  Right  AIMS (if indicated):     Assets:  Resilience Social Support  ADL's:  Intact  Cognition:  WNL  Sleep:  Number of Hours: 6     Treatment Plan Summary: Daily contact with patient to assess and evaluate symptoms and progress in treatment and Medication management  Add Sleep Aid confirmed with staff and need to force meds also discussed long-acting injectable aripiprazole at her request/reduc [place], Medical Student 04/19/2019, 7:53 AM

## 2019-04-20 MED ORDER — PANTOPRAZOLE SODIUM 40 MG PO TBEC: 40.0000 mg | DELAYED_RELEASE_TABLET | Freq: Two times a day (BID) | ORAL | 3 refills | Status: DC

## 2019-04-20 MED ORDER — PROPRANOLOL HCL 20 MG PO TABS: 20.0000 mg | ORAL_TABLET | Freq: Two times a day (BID) | ORAL | 3 refills | Status: DC

## 2019-04-20 MED ORDER — RISPERIDONE 4 MG PO TABS: 4.0000 mg | ORAL_TABLET | Freq: Every day | ORAL | 1 refills | Status: DC

## 2019-04-20 MED ORDER — ARIPIPRAZOLE ER 400 MG IM SRER: 400.0000 mg | INTRAMUSCULAR | 11 refills | Status: DC

## 2019-04-20 MED ORDER — TRAZODONE HCL 150 MG PO TABS: 150.0000 mg | ORAL_TABLET | Freq: Every evening | ORAL | 2 refills | Status: DC | PRN

## 2019-04-20 MED ORDER — BENZTROPINE MESYLATE 1 MG PO TABS: 1.0000 mg | ORAL_TABLET | Freq: Two times a day (BID) | ORAL | 3 refills | Status: DC

## 2019-04-20 NOTE — Progress Notes (Signed)
Recreation Therapy Notes  INPATIENT RECREATION TR PLAN  Patient Details Name: Alyssa Blanchard MRN: [id number] DOB: 02-03-1971 Today's Date: 04/20/2019  Rec Therapy Plan Is patient appropriate for Therapeutic Recreation?: Yes Treatment times per week: about 3 days Estimated Length of Stay: 5-7 days TR Treatment/Interventions: Group participation (Comment)  Discharge Criteria Pt will be discharged from therapy if:: Discharged Treatment plan/goals/alternatives discussed and agreed upon by:: Patient/family  Discharge Summary Short term goals set: See patient care plan Short term goals met: Complete Progress toward goals comments: Groups attended Which groups?: Wellness, Goal setting, Communication Reason goals not met: None Therapeutic equipment acquired: N/A Reason patient discharged from therapy: Discharge from hospital Pt/family agrees with progress & goals achieved: Yes Date patient discharged from therapy: 04/20/19    Victorino Sparrow, LRT/CTRS  Ria Comment, Acea Yagi A 04/20/2019, 11:52 AM

## 2019-04-20 NOTE — Discharge Summary (Signed)
Physician Discharge Summary Note  Patient:  Alyssa Blanchard is an 49 y.o., female MRN:  [id number] DOB:  10-05-1970 Patient phone:  [phone] (home)  Patient address:   [address] [place] [address],  Total Time spent with patient: 45 minutes  Date of Admission:  04/14/2019 Date of Discharge: 04/20/2019  Reason for Admission:  History of Present Illness: This is the third psychiatric admissions in this [facility] for this 49 year old morbidly obese Caucasian female with hx of chronic mental illness. She is known in this [facility] from her previous hospitalization for mood stabilization treatments. She was here last twice in 2005. It appears per chart review that she has been receiving mental health care on an outpatient basis with [facility] health system network. Patient was brought to the [facility] ED by the EMS with complaints of erratic behaviors. She apparently presented to the ED very disorganized & psychotic. Patient was found at the [place] hotel looking for her husband. She was brought to the hospital for evaluation & treatments. During this assessment, Alyssa presents highly manic with rapid/pressured speech, somewhat difficult to re-direct. However, no behavioral issues noted. She is highly obese, but a good historian. She reports, "The ambulance took me to the [facility] either on the second or the third of this month. Someone called the EMS for me. I was at the [place] hotel with $50.00 dollar in my pocket to check. I guess the person who called the EMS was concerned about me because I felt like I was being attacked around my stomach area at the time. I was having an excruciating pain, the worst pain that I have ever felt in my life. I went to the [place] hotel to feel my Dad. He was the architecture of the [employer]. I knew that my dad had passed away a while ago prior to me going to the [employer]. I just want to feel him again. I will be getting married at  this hotel & my father was suppose to give me away. I had not slept in 7 days prior to going to this hotel. I have a mood disorder, but my provider told me that I have Bipolar disorder. I could not function on the medications that they put me on. The medicines made me mean. The medicines did the opposite of what should have done for me. I had in the past been on; Lamictal, Trileptal & Zoloft. Currently, I'm on; Trazodone 50 mg, Melatonin, Protonix, Buspar & Vistaril".  Principal Problem: Bipolar I disorder, most recent episode (or current) manic (HCC) Discharge Diagnoses: Principal Problem:   Bipolar I disorder, most recent episode (or current) manic (HCC) Active Problems:   Psychosis (HCC)   Past Psychiatric History: Chronic undertreated bipolar symptoms  Past Medical History:  Past Medical History:  Diagnosis Date  . Anxiety   . Asthma   . Depression   . GERD (gastroesophageal reflux disease)   . Heel spur    bilat  . History of bronchitis   . History of chicken pox   . History of urinary tract infection   . Migraines   . Plantar fasciitis    bilat  . STD (sexually transmitted disease)    chl hx & hsv 1&2  . Tremors of nervous system   . Urinary incontinence     Past Surgical History:  Procedure Laterality Date  . LAPAROSCOPIC GASTRIC SLEEVE RESECTION N/A 11/27/2014   Procedure: LAPAROSCOPIC GASTRIC SLEEVE RESECTION WITH HIATAL  HERNIA REPAIR UPPER ENDOSCOPY;  Surgeon: Luretha Murphy, MD;  Location: [facility] ORS;  Service: General;  Laterality: N/A;  . TONSILLECTOMY  1978   Family History:  Family History  Problem Relation Age of Onset  . Diabetes Mother   . Hypertension Mother   . Hyperlipidemia Mother   . Kidney disease Mother   . Cancer Father        pancreatic  . Hypertension Maternal Grandmother   . Diabetes Maternal Grandmother   . Heart failure Maternal Grandmother        CHF  . Heart attack Maternal Grandfather   . Hypertension Maternal Grandfather   .  Hypertension Paternal Grandmother   . Alzheimer's disease Paternal Grandmother   . Hypertension Paternal Grandfather   . Cancer Maternal Uncle        melanoma  . Cancer Paternal Uncle        kidney   Family Psychiatric  History: No new data Social History:  Social History   Substance and Sexual Activity  Alcohol Use Yes  . Alcohol/week: 0.0 standard drinks   Comment: 1 a month     Social History   Substance and Sexual Activity  Drug Use Yes  . Types: Marijuana, Cocaine    Social History   Socioeconomic History  . Marital status: Single    Spouse name: Not on file  . Number of children: Not on file  . Years of education: 31  . Highest education level: Not on file  Occupational History  . Occupation: Chartered certified accountant: [employer]  Tobacco Use  . Smoking status: Former Smoker    Packs/day: 0.25    Years: 5.00    Pack years: 1.25    Types: Cigarettes    Quit date: 03/10/2012    Years since quitting: 7.1  . Smokeless tobacco: Never Used  Substance and Sexual Activity  . Alcohol use: Yes    Alcohol/week: 0.0 standard drinks    Comment: 1 a month  . Drug use: Yes    Types: Marijuana, Cocaine  . Sexual activity: Yes    Partners: Male    Birth control/protection: Condom  Other Topics Concern  . Not on file  Social History Narrative   Regular exercise-no   Caffeine Use-yes, 1-2 cups of caffeine daily   Social Determinants of Health   Financial Resource Strain:   . Difficulty of Paying Living Expenses: Not on file  Food Insecurity:   . Worried About Programme researcher, broadcasting/film/video in the Last Year: Not on file  . Ran Out of Food in the Last Year: Not on file  Transportation Needs:   . Lack of Transportation (Medical): Not on file  . Lack of Transportation (Non-Medical): Not on file  Physical Activity:   . Days of Exercise per Week: Not on file  . Minutes of Exercise per Session: Not on file  Stress:   . Feeling of Stress : Not on file  Social Connections:    . Frequency of Communication with Friends and Family: Not on file  . Frequency of Social Gatherings with Friends and Family: Not on file  . Attends Religious Services: Not on file  . Active Member of Clubs or Organizations: Not on file  . Attends Banker Meetings: Not on file  . Marital Status: Not on file    Hospital Course:    As discussed, Alyssa Blanchard had required readmission due to a public disturbance propelled by her delusional beliefs and  untreated mania.  She was admitted on an involuntary basis  The patient generally complied with medications she found them somewhat sedating so we were able to reduce the dose of risperidone to 4 mg at bedtime by the time of discharge.  Further, she actually requested long-acting injectable aripiprazole this was administered prior to discharge of course.  She did reorganize, by the date of the 10th she had no obvious delusional believes although we feel that the statements about having a husband may be delusional still and that may be ongoing but she denied auditory or visual hallucinations, denied thoughts of harming self or others and had finally organized to what we feel would be her baseline status.  She expressed to be staying with her mother   Musculoskeletal: Strength & Muscle Tone: within normal limits Gait & Station: normal Patient leans: N/A  Psychiatric Specialty Exam: Physical Exam  Review of Systems  Blood pressure (!) 134/96, pulse 95, temperature 98.4 F (36.9 C), temperature source Oral, resp. rate 20, height 5\' 6"  (1.676 m), weight (!) 176.4 kg, SpO2 98 %.Body mass index is 62.79 kg/m.  General Appearance: Casual  Eye Contact:  Good  Speech:  Clear and Coherent  Volume:  Normal  Mood:  Euthymic  Affect:  Appropriate and Congruent  Thought Process:  Goal Directed  Orientation:  Full (Time, Place, and Person)  Thought Content:  Denies hallucinations or racing thoughts may have some fixed delusions but they are  harmless at this point in time  Suicidal Thoughts:  No  Homicidal Thoughts:  No  Memory:  Immediate;   Fair Recent;   Fair Remote;   Fair  Judgement:  Fair  Insight:  Fair  Psychomotor Activity:  Normal  Concentration:  Concentration: Fair and Attention Span: Fair  Recall:  [employer] of Knowledge:  Fair  Language:  Fair  Akathisia:  Negative  Handed:  Right  AIMS (if indicated):     Assets:  Communication Skills Leisure Time Resilience  ADL's:  Intact  Cognition:  WNL  Sleep:  Number of Hours: 4.25     Have you used any form of tobacco in the last 30 days? (Cigarettes, Smokeless Tobacco, Cigars, and/or Pipes): No  Has this patient used any form of tobacco in the last 30 days? (Cigarettes, Smokeless Tobacco, Cigars, and/or Pipes) Yes, No  Blood Alcohol level:  Lab Results  Component Value Date   ETH <10 69/48/5462    Metabolic Disorder Labs:  Lab Results  Component Value Date   HGBA1C 5.8 (H) 04/16/2019   MPG 119.76 04/16/2019   Lab Results  Component Value Date   PROLACTIN 50.4 (H) 04/16/2019   Lab Results  Component Value Date   CHOL 137 04/16/2019   TRIG 109 04/16/2019   HDL 37 (L) 04/16/2019   CHOLHDL 3.7 04/16/2019   VLDL 22 04/16/2019   LDLCALC 78 04/16/2019   [place] 79 08/22/2016    See Psychiatric Specialty Exam and Suicide Risk Assessment completed by Attending Physician prior to discharge.  Discharge destination:  Home  Is patient on multiple antipsychotic therapies at discharge:  No   Has Patient had three or more failed trials of antipsychotic monotherapy by history:  No  Recommended Plan for Multiple Antipsychotic Therapies: NA Standard overlap with long-acting injectable aripiprazole  Allergies as of 04/20/2019      Reactions   Gabapentin Anaphylaxis   Topiramate Other (See Comments)   Jaw tightness and chest discomfort    Montelukast Other (See Comments)  Nightmares    Other    Pt states no narcotics ever!       Medication  List    STOP taking these medications   ipratropium-albuterol 0.5-2.5 (3) MG/3ML Soln Commonly known as: DUONEB   predniSONE 10 MG tablet Commonly known as: DELTASONE     TAKE these medications     Indication  albuterol 108 (90 Base) MCG/ACT inhaler Commonly known as: VENTOLIN HFA INHALE 2 PUFFS INTO THE LUNGS EVERY 4 HOURS AS NEEDED FOR WHEEZING OR FOR SHORTNESS OF BREATH  Indication: Asthma   ARIPiprazole ER 400 MG Srer injection Commonly known as: ABILIFY MAINTENA Inject 2 mLs (400 mg total) into the muscle every 28 (twenty-eight) days. Due 3/5 Start taking on: May 17, 2019  Indication: MIXED BIPOLAR AFFECTIVE DISORDER   benzonatate 100 MG capsule Commonly known as: Tessalon Perles Take 2 capsules (200 mg total) by mouth 3 (three) times daily as needed for cough.  Indication: Cough   benztropine 1 MG tablet Commonly known as: COGENTIN Take 1 tablet (1 mg total) by mouth 2 (two) times daily.  Indication: Extrapyramidal Reaction caused by Medications   montelukast 10 MG tablet Commonly known as: SINGULAIR Take 1 tablet (10 mg total) by mouth at bedtime.  Indication: Perennial Allergic Rhinitis   pantoprazole 40 MG tablet Commonly known as: PROTONIX Take 1 tablet (40 mg total) by mouth 2 (two) times daily.  Indication: Gastroesophageal Reflux Disease   propranolol 20 MG tablet Commonly known as: INDERAL Take 1 tablet (20 mg total) by mouth 2 (two) times daily.  Indication: High Blood Pressure Disorder   risperidone 4 MG tablet Commonly known as: RISPERDAL Take 1 tablet (4 mg total) by mouth at bedtime.  Indication: Manic Phase of Manic-Depression   SUMAtriptan 100 MG tablet Commonly known as: Imitrex ! Tab at onset of HA. May repeat in 2 hours if headache persists or recurs.No more than 4 doses a month.  Indication: Headache   traZODone 150 MG tablet Commonly known as: DESYREL Take 1 tablet (150 mg total) by mouth at bedtime as needed and may repeat dose  one time if needed for sleep.  Indication: Trouble Sleeping      Follow-up Information    Burwell Anthony,LPC. Call.   Why: Please call to schedule an appointment with this provider. Contact information: [address]  [place], [place] [address]  P: [phone] F: [phone]          SignedMalvin Johns, MD 04/20/2019, 7:50 AM

## 2019-04-20 NOTE — Progress Notes (Signed)
Recreation Therapy Notes  Date: 2.10.21 Time: 1000 Location: 500 Hall Dayroom  Group Topic: Communication  Goal Area(s) Addresses:  Patient will effectively communicate with peers in group.  Patient will verbalize benefit of healthy communication.  Behavioral Response: Engaged  Intervention: Group Discussion  Activity: Check-In.  Patients were to discuss any concerns or positive achievements they may have.  Education: Communication, Discharge Planning  Education Outcome: Acknowledges understanding/In group clarification offered/Needs additional education.   Clinical Observations/Feedback: Pt was engaged and spoke about how she has benefited from the groups she has attended.  Pt was very thankful to the staff for the treatment she received.  Pt was bright and excited about going home today.    Caroll Rancher, LRT/CTRS    Caroll Rancher A 04/20/2019 11:36 AM

## 2019-04-20 NOTE — Progress Notes (Signed)
  [facility] Adult Case Management Discharge Plan :  Will you be returning to the same living situation after discharge:  Yes,  mom's house At discharge, do you have transportation home?: Yes,  ride will be provided  Do you have the ability to pay for your medications: Yes,  BCBS  Release of information consent forms completed and in the chart;  Patient's signature needed at discharge.  Patient to Follow up at: Follow-up Information    Burwell Anthony,LPC. Call.   Why: Please call to schedule an appointment with this provider. Contact information: [address] [place]  [place], [place] [address]  P: [phone] F: [phone]          Next level of care provider has access to [facility] Link:no  Safety Planning and Suicide Prevention discussed: No. CSW attempted twice with "husband" (this was a delusion. Pt does not have husband)  Have you used any form of tobacco in the last 30 days? (Cigarettes, Smokeless Tobacco, Cigars, and/or Pipes): No  Has patient been referred to the Quitline?: N/A patient is not a smoker  Patient has been referred for addiction treatment: Yes  Reynold Bowen, Student-Social Work 04/20/2019, 9:08 AM

## 2019-04-20 NOTE — Progress Notes (Signed)
Patient ID: Alyssa Blanchard, female   DOB: Mar 20, 1970, 49 y.o.   MRN: [id number] Patient discharged to home/self care on her own accord. Patient denied SI, HI and AVH upon discharge.  Patient acknowledged understanding of all discharge instructions and receipt of personal belongings.

## 2019-04-20 NOTE — Tx Team (Signed)
Interdisciplinary Treatment and Diagnostic Plan Update  04/20/2019 Time of Session: 10:55am  Alyssa Blanchard MRN: [id number]  Principal Diagnosis: Bipolar I disorder, most recent episode (or current) manic (HCC)  Secondary Diagnoses: Principal Problem:   Bipolar I disorder, most recent episode (or current) manic (HCC) Active Problems:   Psychosis (HCC)   Current Medications:  Current Facility-Administered Medications  Medication Dose Route Frequency Provider Last Rate Last Admin  . acetaminophen (TYLENOL) tablet 650 mg  650 mg Oral Q6H PRN Patrcia Dolly, FNP   650 mg at 04/18/19 2039  . albuterol (VENTOLIN HFA) 108 (90 Base) MCG/ACT inhaler 1 puff  1 puff Inhalation Q6H PRN Patrcia Dolly, FNP      . alum & mag hydroxide-simeth (MAALOX/MYLANTA) 200-200-20 MG/5ML suspension 30 mL  30 mL Oral Q4H PRN Patrcia Dolly, FNP      . ARIPiprazole ER (ABILIFY MAINTENA) injection 400 mg  400 mg Intramuscular Q28 days Malvin Johns, MD   400 mg at 04/19/19 1319  . benztropine (COGENTIN) tablet 1 mg  1 mg Oral BID Malvin Johns, MD   1 mg at 04/20/19 0739  . clonazePAM (KLONOPIN) tablet 2 mg  2 mg Oral QHS Malvin Johns, MD   2 mg at 04/19/19 2126  . fluticasone furoate-vilanterol (BREO ELLIPTA) 200-25 MCG/INH 1 puff  1 puff Inhalation Daily Nwoko, Agnes I, NP   1 puff at 04/20/19 0739  . haloperidol lactate (HALDOL) injection 10 mg  10 mg Intramuscular BID PRN Malvin Johns, MD   10 mg at 04/17/19 2133  . haloperidol lactate (HALDOL) injection 5 mg  5 mg Intramuscular Once Malvin Johns, MD      . ipratropium-albuterol (DUONEB) 0.5-2.5 (3) MG/3ML nebulizer solution 3 mL  3 mL Nebulization Q6H Patrcia Dolly, FNP   3 mL at 04/19/19 1100  . levalbuterol (XOPENEX HFA) inhaler 2 puff  2 puff Inhalation Q8H Armandina Stammer I, NP   2 puff at 04/20/19 [id number]  . magnesium hydroxide (MILK OF MAGNESIA) suspension 30 mL  30 mL Oral Daily PRN Patrcia Dolly, FNP      . pantoprazole (PROTONIX) EC tablet 40 mg  40 mg Oral BID Malvin Johns, MD   40 mg at 04/20/19 0640  . propranolol (INDERAL) tablet 10 mg  10 mg Oral BID Malvin Johns, MD   10 mg at 04/20/19 0739  . risperiDONE (RISPERDAL) tablet 2 mg  2 mg Oral BID Malvin Johns, MD   2 mg at 04/20/19 0739  . sodium chloride (OCEAN) 0.65 % nasal spray 1 spray  1 spray Each Nare PRN Jearld Lesch, NP   1 spray at 04/19/19 1323  . traZODone (DESYREL) tablet 100 mg  100 mg Oral QHS PRN,MR X 1 Dixon, Rashaun M, NP   100 mg at 04/17/19 2132   PTA Medications: Medications Prior to Admission  Medication Sig Dispense Refill Last Dose  . albuterol (VENTOLIN HFA) 108 (90 Base) MCG/ACT inhaler INHALE 2 PUFFS INTO THE LUNGS EVERY 4 HOURS AS NEEDED FOR WHEEZING OR FOR SHORTNESS OF BREATH (Patient not taking: Reported on 04/13/2019) 18 g 1   . benzonatate (TESSALON PERLES) 100 MG capsule Take 2 capsules (200 mg total) by mouth 3 (three) times daily as needed for cough. (Patient not taking: Reported on 04/13/2019) 20 capsule 0   . ipratropium-albuterol (DUONEB) 0.5-2.5 (3) MG/3ML SOLN Take 3 mLs by nebulization every 6 (six) hours. (Patient not taking: Reported on 04/13/2019) 180 mL 0   . montelukast (  SINGULAIR) 10 MG tablet Take 1 tablet (10 mg total) by mouth at bedtime. (Patient not taking: Reported on 04/13/2019) 30 tablet 3   . predniSONE (DELTASONE) 10 MG tablet Take 5 tablets (50 mg total) by mouth daily. (Patient not taking: Reported on 04/13/2019) 25 tablet 0   . SUMAtriptan (IMITREX) 100 MG tablet ! Tab at onset of HA. May repeat in 2 hours if headache persists or recurs.No more than 4 doses a month. (Patient not taking: Reported on 04/13/2019) 10 tablet 5     Patient Stressors: Legal issue Marital or family conflict Medication change or noncompliance  Patient Strengths: General fund of knowledge Motivation for treatment/growth  Treatment Modalities: Medication Management, Group therapy, Case management,  1 to 1 session with clinician, Psychoeducation, Recreational  therapy.   Physician Treatment Plan for Primary Diagnosis: Bipolar I disorder, most recent episode (or current) manic ([place]) Long Term Goal(s): Improvement in symptoms so as ready for discharge Improvement in symptoms so as ready for discharge   Short Term Goals: Ability to identify changes in lifestyle to reduce recurrence of condition will improve Ability to verbalize feelings will improve Ability to demonstrate self-control will improve Ability to identify and develop effective coping behaviors will improve Compliance with prescribed medications will improve Ability to identify triggers associated with substance abuse/mental health issues will improve  Medication Management: Evaluate patient's response, side effects, and tolerance of medication regimen.  Therapeutic Interventions: 1 to 1 sessions, Unit Group sessions and Medication administration.  Evaluation of Outcomes: Adequate for Discharge  Physician Treatment Plan for Secondary Diagnosis: Principal Problem:   Bipolar I disorder, most recent episode (or current) manic ([place]) Active Problems:   Psychosis ([place])  Long Term Goal(s): Improvement in symptoms so as ready for discharge Improvement in symptoms so as ready for discharge   Short Term Goals: Ability to identify changes in lifestyle to reduce recurrence of condition will improve Ability to verbalize feelings will improve Ability to demonstrate self-control will improve Ability to identify and develop effective coping behaviors will improve Compliance with prescribed medications will improve Ability to identify triggers associated with substance abuse/mental health issues will improve     Medication Management: Evaluate patient's response, side effects, and tolerance of medication regimen.  Therapeutic Interventions: 1 to 1 sessions, Unit Group sessions and Medication administration.  Evaluation of Outcomes: Adequate for Discharge   RN Treatment Plan for Primary  Diagnosis: Bipolar I disorder, most recent episode (or current) manic ([place]) Long Term Goal(s): Knowledge of disease and therapeutic regimen to maintain health will improve  Short Term Goals: Ability to participate in decision making will improve, Ability to verbalize feelings will improve, Ability to disclose and discuss suicidal ideas, Ability to identify and develop effective coping behaviors will improve and Compliance with prescribed medications will improve  Medication Management: RN will administer medications as ordered by provider, will assess and evaluate patient's response and provide education to patient for prescribed medication. RN will report any adverse and/or side effects to prescribing provider.  Therapeutic Interventions: 1 on 1 counseling sessions, Psychoeducation, Medication administration, Evaluate responses to treatment, Monitor vital signs and CBGs as ordered, Perform/monitor CIWA, COWS, AIMS and Fall Risk screenings as ordered, Perform wound care treatments as ordered.  Evaluation of Outcomes: Adequate for Discharge   LCSW Treatment Plan for Primary Diagnosis: Bipolar I disorder, most recent episode (or current) manic ([place]) Long Term Goal(s): Safe transition to appropriate next level of care at discharge, Engage patient in therapeutic group addressing interpersonal concerns.  Short  Term Goals: Engage patient in aftercare planning with referrals and resources and Increase skills for wellness and recovery  Therapeutic Interventions: Assess for all discharge needs, 1 to 1 time with Social worker, Explore available resources and support systems, Assess for adequacy in community support network, Educate family and significant other(s) on suicide prevention, Complete Psychosocial Assessment, Interpersonal group therapy.  Evaluation of Outcomes: Adequate for Discharge   Progress in Treatment: Attending groups: Yes. Participating in groups: Yes. Taking medication as  prescribed: Yes. Toleration medication: Yes. Family/Significant other contact made: Yes, individual(s) contacted:  with patient  Patient understands diagnosis: Yes. Discussing patient identified problems/goals with staff: Yes. Medical problems stabilized or resolved: Yes. Denies suicidal/homicidal ideation: Yes. Issues/concerns per patient self-inventory: No. Other:   New problem(s) identified: No, Describe:  none   New Short Term/Long Term Goal(s): Medication stabilization, elimination of SI thoughts, and development of a comprehensive mental wellness plan.   Patient Goals:  Pt was in a meeting with the NP  Discharge Plan or Barriers: Pt is adequate for discharge   Reason for Continuation of Hospitalization: Aggression Delusions  Hallucinations Medication stabilization  Estimated Length of Stay: pt is adequate for discharge   Attendees: Patient: Alyssa Blanchard  04/20/2019   Physician:  04/20/2019   Nursing:  04/20/2019   RN Care Manager: 04/20/2019   Social Worker: Stephannie Peters, LCSW 04/20/2019   Recreational Therapist:  04/20/2019   Other: Earlyne Iba, MSW intern  04/20/2019   Other:  04/20/2019   Other: 04/20/2019     Scribe for Treatment Team: Reynold Bowen, Student-Social Work 04/20/2019 11:13 AM

## 2019-04-20 NOTE — Plan of Care (Signed)
Pt was able to focus on task with 2 prompts from LRT at completion of recreation therapy group sessions.   Caroll Rancher, LRT/CTRS

## 2019-04-20 NOTE — [facility] Suicide Risk Assessment (Signed)
[facility] Discharge Suicide Risk Assessment   Principal Problem: Bipolar I disorder, most recent episode (or current) manic (HCC) Discharge Diagnoses: Principal Problem:   Bipolar I disorder, most recent episode (or current) manic (HCC) Active Problems:   Psychosis (HCC)   Total Time spent with patient: 45 minutes  Musculoskeletal: Strength & Muscle Tone: within normal limits Gait & Station: normal Patient leans: N/A  Psychiatric Specialty Exam: Physical Exam  Review of Systems  Blood pressure (!) 134/96, pulse 95, temperature 98.4 F (36.9 C), temperature source Oral, resp. rate 20, height 5\' 6"  (1.676 m), weight (!) 176.4 kg, SpO2 98 %.Body mass index is 62.79 kg/m.  General Appearance: Casual  Eye Contact:  Good  Speech:  Clear and Coherent  Volume:  Normal  Mood:  Euthymic  Affect:  Appropriate and Congruent  Thought Process:  Goal Directed  Orientation:  Full (Time, Place, and Person)  Thought Content:  Denies hallucinations or racing thoughts may have some fixed delusions but they are harmless at this point in time  Suicidal Thoughts:  No  Homicidal Thoughts:  No  Memory:  Immediate;   Fair Recent;   Fair Remote;   Fair  Judgement:  Fair  Insight:  Fair  Psychomotor Activity:  Normal  Concentration:  Concentration: Fair and Attention Span: Fair  Recall:  of Knowledge:  Fair  Language:  Fair  Akathisia:  Negative  Handed:  Right  AIMS (if indicated):     Assets:  Communication Skills Leisure Time Resilience  ADL's:  Intact  Cognition:  WNL  Sleep:  Number of Hours: 4.25   Mental Status Per Nursing Assessment::   On Admission:  NA  Demographic Factors:  Caucasian, Low socioeconomic status, Living alone and Unemployed  Loss Factors: Decrease in vocational status and Loss of significant relationship  Historical Factors: Impulsivity  Risk Reduction Factors:   Sense of responsibility to family, Religious beliefs about death, Positive therapeutic  relationship and Positive coping skills or problem solving skills  Continued Clinical Symptoms:  Previous Psychiatric Diagnoses and Treatments  Cognitive Features That Contribute To Risk:  Loss of executive function    Suicide Risk:  Minimal: No identifiable suicidal ideation.  Patients presenting with no risk factors but with morbid ruminations; may be classified as minimal risk based on the severity of the depressive symptoms  Follow-up Information    Burwell Anthony,LPC. Call.   Why: Please call to schedule an appointment with this provider. Contact information: [address]  [place], [place]  P: [phone] F: [phone]          Plan Of Care/Follow-up recommendations:  Activity:  nl  [phone], MD 04/20/2019, 7:55 AM

## 2019-08-02 ENCOUNTER — Encounter (HOSPITAL_COMMUNITY): Payer: Self-pay | Admitting: Emergency Medicine

## 2019-08-02 ENCOUNTER — Inpatient Hospital Stay (HOSPITAL_COMMUNITY)
Admission: EM | Admit: 2019-08-02 | Discharge: 2019-08-05 | DRG: 308 | Disposition: A | Discharge status: Home / Self Care | Payer: BLUE CROSS/BLUE SHIELD | Attending: Internal Medicine | Admitting: Internal Medicine

## 2019-08-02 ENCOUNTER — Emergency Department (HOSPITAL_COMMUNITY): Payer: BLUE CROSS/BLUE SHIELD

## 2019-08-02 DIAGNOSIS — I5031 Acute diastolic (congestive) heart failure: Secondary | ICD-10-CM | POA: Diagnosis present

## 2019-08-02 DIAGNOSIS — Z20822 Contact with and (suspected) exposure to covid-19: Secondary | ICD-10-CM | POA: Diagnosis present

## 2019-08-02 DIAGNOSIS — J810 Acute pulmonary edema: Secondary | ICD-10-CM

## 2019-08-02 DIAGNOSIS — I480 Paroxysmal atrial fibrillation: Principal | ICD-10-CM | POA: Diagnosis present

## 2019-08-02 DIAGNOSIS — Z8249 Family history of ischemic heart disease and other diseases of the circulatory system: Secondary | ICD-10-CM

## 2019-08-02 DIAGNOSIS — Z6841 Body Mass Index (BMI) 40.0 and over, adult: Secondary | ICD-10-CM

## 2019-08-02 DIAGNOSIS — F319 Bipolar disorder, unspecified: Secondary | ICD-10-CM | POA: Diagnosis present

## 2019-08-02 DIAGNOSIS — Z888 Allergy status to other drugs, medicaments and biological substances status: Secondary | ICD-10-CM

## 2019-08-02 DIAGNOSIS — F3174 Bipolar disorder, in full remission, most recent episode manic: Secondary | ICD-10-CM | POA: Diagnosis present

## 2019-08-02 DIAGNOSIS — I4891 Unspecified atrial fibrillation: Secondary | ICD-10-CM

## 2019-08-02 DIAGNOSIS — Z79899 Other long term (current) drug therapy: Secondary | ICD-10-CM

## 2019-08-02 DIAGNOSIS — Z87891 Personal history of nicotine dependence: Secondary | ICD-10-CM

## 2019-08-02 DIAGNOSIS — E662 Morbid (severe) obesity with alveolar hypoventilation: Secondary | ICD-10-CM | POA: Diagnosis present

## 2019-08-02 DIAGNOSIS — F3110 Bipolar disorder, current episode manic without psychotic features, unspecified: Secondary | ICD-10-CM | POA: Diagnosis present

## 2019-08-02 DIAGNOSIS — Z9884 Bariatric surgery status: Secondary | ICD-10-CM

## 2019-08-02 DIAGNOSIS — J45909 Unspecified asthma, uncomplicated: Secondary | ICD-10-CM | POA: Diagnosis present

## 2019-08-02 DIAGNOSIS — Z8709 Personal history of other diseases of the respiratory system: Secondary | ICD-10-CM

## 2019-08-02 DIAGNOSIS — F419 Anxiety disorder, unspecified: Secondary | ICD-10-CM | POA: Diagnosis present

## 2019-08-02 DIAGNOSIS — Z7951 Long term (current) use of inhaled steroids: Secondary | ICD-10-CM

## 2019-08-02 DIAGNOSIS — K219 Gastro-esophageal reflux disease without esophagitis: Secondary | ICD-10-CM | POA: Diagnosis present

## 2019-08-02 HISTORY — DX: Cardiac arrhythmia, unspecified: I49.9

## 2019-08-02 LAB — CBC WITH DIFFERENTIAL/PLATELET
Abs Immature Granulocytes: 0.06 10*3/uL (ref 0.00–0.07)
Basophils Absolute: 0.1 10*3/uL (ref 0.0–0.1)
Basophils Relative: 1 %
Eosinophils Absolute: 0.1 10*3/uL (ref 0.0–0.5)
Eosinophils Relative: 1 %
HCT: 39.6 % (ref 36.0–46.0)
Hemoglobin: 12.2 g/dL (ref 12.0–15.0)
Immature Granulocytes: 1 %
Lymphocytes Relative: 14 %
Lymphs Abs: 1.5 10*3/uL (ref 0.7–4.0)
MCH: 26.2 pg (ref 26.0–34.0)
MCHC: 30.8 g/dL (ref 30.0–36.0)
MCV: 85 fL (ref 80.0–100.0)
Monocytes Absolute: 0.9 10*3/uL (ref 0.1–1.0)
Monocytes Relative: 9 %
Neutro Abs: 7.8 10*3/uL — ABNORMAL HIGH (ref 1.7–7.7)
Neutrophils Relative %: 74 %
Platelets: 293 10*3/uL (ref 150–400)
RBC: 4.66 MIL/uL (ref 3.87–5.11)
RDW: 15.9 % — ABNORMAL HIGH (ref 11.5–15.5)
WBC: 10.3 10*3/uL (ref 4.0–10.5)
nRBC: 0 % (ref 0.0–0.2)

## 2019-08-02 LAB — MAGNESIUM: Magnesium: 2 mg/dL (ref 1.7–2.4)

## 2019-08-02 LAB — BASIC METABOLIC PANEL
Anion gap: 10 (ref 5–15)
BUN: 10 mg/dL (ref 6–20)
CO2: 21 mmol/L — ABNORMAL LOW (ref 22–32)
Calcium: 8.6 mg/dL — ABNORMAL LOW (ref 8.9–10.3)
Chloride: 106 mmol/L (ref 98–111)
Creatinine, Ser: 0.93 mg/dL (ref 0.44–1.00)
GFR calc Af Amer: >60 mL/min (ref >60)
GFR calc non Af Amer: >60 mL/min (ref >60)
Glucose, Bld: 114 mg/dL — ABNORMAL HIGH (ref 70–99)
Potassium: 3.9 mmol/L (ref 3.5–5.1)
Sodium: 137 mmol/L (ref 135–145)

## 2019-08-02 LAB — D-DIMER, QUANTITATIVE: D-Dimer, Quant: 1.11 ug/mL-FEU — ABNORMAL HIGH (ref 0.00–0.50)

## 2019-08-02 LAB — TROPONIN I (HIGH SENSITIVITY)
Troponin I (High Sensitivity): 8 ng/L (ref <18)
Troponin I (High Sensitivity): 9 ng/L (ref <18)
Troponin I (High Sensitivity): 8 ng/L (ref <18)

## 2019-08-02 LAB — BRAIN NATRIURETIC PEPTIDE: B Natriuretic Peptide: 402.6 pg/mL — ABNORMAL HIGH (ref 0.0–100.0)

## 2019-08-02 LAB — TSH: TSH: 2.735 u[IU]/mL (ref 0.350–4.500)

## 2019-08-02 LAB — SARS CORONAVIRUS 2 BY RT PCR (HOSPITAL ORDER, PERFORMED IN ~~LOC~~ HOSPITAL LAB): SARS Coronavirus 2: NEGATIVE

## 2019-08-02 MED ORDER — HEPARIN (PORCINE) 25000 UT/250ML-% IV SOLN
1600.0000 [IU]/h | INTRAVENOUS | Status: DC
  Administered 2019-08-03: 1600 [IU]/h via INTRAVENOUS
  Filled 2019-08-02: qty 250

## 2019-08-02 MED ORDER — IOHEXOL 350 MG/ML SOLN
100.0000 mL | Freq: Once | INTRAVENOUS | Status: AC | PRN
  Administered 2019-08-02: 100 mL via INTRAVENOUS

## 2019-08-02 MED ORDER — LEVALBUTEROL TARTRATE 45 MCG/ACT IN AERO: 2.0000 | INHALATION_SPRAY | Freq: Four times a day (QID) | RESPIRATORY_TRACT | Status: DC | PRN

## 2019-08-02 MED ORDER — FUROSEMIDE 10 MG/ML IJ SOLN
20.0000 mg | Freq: Once | INTRAMUSCULAR | Status: AC
  Administered 2019-08-02: 20 mg via INTRAVENOUS
  Filled 2019-08-02: qty 4

## 2019-08-02 MED ORDER — ONDANSETRON HCL 4 MG/2ML IJ SOLN: 4.0000 mg | Freq: Four times a day (QID) | INTRAMUSCULAR | Status: DC | PRN

## 2019-08-02 MED ORDER — FLUTICASONE FUROATE-VILANTEROL 200-25 MCG/INH IN AEPB
1.0000 | INHALATION_SPRAY | Freq: Every day | RESPIRATORY_TRACT | Status: DC
  Filled 2019-08-02 (×2): qty 28

## 2019-08-02 MED ORDER — DILTIAZEM LOAD VIA INFUSION
15.0000 mg | Freq: Once | INTRAVENOUS | Status: AC
  Administered 2019-08-02: 15 mg via INTRAVENOUS
  Filled 2019-08-02: qty 15

## 2019-08-02 MED ORDER — ONDANSETRON HCL 4 MG PO TABS: 4.0000 mg | ORAL_TABLET | Freq: Four times a day (QID) | ORAL | Status: DC | PRN

## 2019-08-02 MED ORDER — HEPARIN BOLUS VIA INFUSION
4000.0000 [IU] | Freq: Once | INTRAVENOUS | Status: AC
  Administered 2019-08-03: 4000 [IU] via INTRAVENOUS
  Filled 2019-08-02: qty 4000

## 2019-08-02 MED ORDER — DILTIAZEM HCL-DEXTROSE 125-5 MG/125ML-% IV SOLN (PREMIX)
5.0000 mg/h | INTRAVENOUS | Status: DC
  Administered 2019-08-02: 5 mg/h via INTRAVENOUS
  Administered 2019-08-03 – 2019-08-05 (×6): 15 mg/h via INTRAVENOUS
  Filled 2019-08-02 (×7): qty 125

## 2019-08-02 NOTE — H&P (Signed)
History and Physical    Alyssa Blanchard YPP:[id number] DOB: Aug 03, 1970 DOA: 08/02/2019  PCP: Horald Pollen, MD  Patient coming from: Home.  Chief Complaint: Tachycardia.  HPI: Alyssa Blanchard is a 49 y.o. female with history of bronchial asthma has been experiencing increasing exertional shortness of breath over the last 4 to 5 days.  Last week also noticed some lower extremity edema.  Had some chest tightness but no chest pain and had gone to her primary care physician today and was found to be tachycardic and was referred to the ER.  Patient also has few days has been taking her inhalers despite which she was still short of breath.  Denies fever chills productive cough.  ED Course: In the ER patient was found to be in A. fib with RVR and was started on Cardizem infusion.  TSH was normal high sensitive troponin was negative.  BNP was 402.  CT angiogram of the chest shows features concerning for possible CHF with some effusion.  Pneumonia cannot be ruled out but patient was afebrile and had no leukocytosis.  Patient was given 1 dose of Lasix admitted for further management of A. fib with RVR which is new onset.  Possible CHF.  Review of Systems: As per HPI, rest all negative.   Past Medical History:  Diagnosis Date  . Anxiety   . Asthma   . Depression   . GERD (gastroesophageal reflux disease)   . Heel spur    bilat  . History of bronchitis   . History of chicken pox   . History of urinary tract infection   . Migraines   . Plantar fasciitis    bilat  . STD (sexually transmitted disease)    chl hx & hsv 1&2  . Tremors of nervous system   . Urinary incontinence     Past Surgical History:  Procedure Laterality Date  . LAPAROSCOPIC GASTRIC SLEEVE RESECTION N/A 11/27/2014   Procedure: LAPAROSCOPIC GASTRIC SLEEVE RESECTION WITH HIATAL HERNIA REPAIR UPPER ENDOSCOPY;  Surgeon: Johnathan Hausen, MD;  Location: [facility];  Service: General;  Laterality: N/A;  . TONSILLECTOMY  1978     reports that she quit smoking about 7 years ago. Her smoking use included cigarettes. She has a 1.25 pack-year smoking history. She has never used smokeless tobacco. She reports current alcohol use. She reports current drug use. Drugs: Marijuana and Cocaine.  Allergies  Allergen Reactions  . Gabapentin Anaphylaxis  . Topiramate Other (See Comments)    Jaw tightness and chest discomfort   . Imitrex [Sumatriptan] Other (See Comments)    Increased heart rate  . Montelukast Other (See Comments)    Nightmares   . Other     Pt states no narcotics ever!     Family History  Problem Relation Age of Onset  . Diabetes Mother   . Hypertension Mother   . Hyperlipidemia Mother   . Kidney disease Mother   . Cancer Father        pancreatic  . Hypertension Maternal Grandmother   . Diabetes Maternal Grandmother   . Heart failure Maternal Grandmother        CHF  . Heart attack Maternal Grandfather   . Hypertension Maternal Grandfather   . Hypertension Paternal Grandmother   . Alzheimer's disease Paternal Grandmother   . Hypertension Paternal Grandfather   . Cancer Maternal Uncle        melanoma  . Cancer Paternal Uncle        kidney  Prior to Admission medications   Medication Sig Start Date End Date Taking? Authorizing Provider  BREO ELLIPTA 200-25 MCG/INH AEPB Inhale 1 puff into the lungs daily. 07/30/19  Yes [provider]  ipratropium (ATROVENT) 0.03 % nasal spray Place 1 spray into both nostrils daily as needed for rhinitis.  03/31/19  Yes [provider]  levalbuterol (XOPENEX HFA) 45 MCG/ACT inhaler Inhale 2 puffs into the lungs every 6 (six) hours as needed for wheezing or shortness of breath.  06/28/19  Yes [provider]  methylPREDNISolone acetate (DEPO-MEDROL) 80 MG/ML injection Inject 80 mg into the muscle once.  08/02/19  Yes [provider]  pantoprazole (PROTONIX) 40 MG tablet Take 1 tablet (40 mg total) by mouth 2 (two) times daily.  04/20/19  Yes Malvin Johns, MD  albuterol (VENTOLIN HFA) 108 (90 Base) MCG/ACT inhaler INHALE 2 PUFFS INTO THE LUNGS EVERY 4 HOURS AS NEEDED FOR WHEEZING OR FOR SHORTNESS OF BREATH Patient not taking: Reported on 08/02/2019 12/11/18   Derwood Kaplan, MD  ARIPiprazole ER (ABILIFY MAINTENA) 400 MG SRER injection Inject 2 mLs (400 mg total) into the muscle every 28 (twenty-eight) days. Due 3/5 Patient not taking: Reported on 08/02/2019 05/17/19   Malvin Johns, MD  benzonatate (TESSALON PERLES) 100 MG capsule Take 2 capsules (200 mg total) by mouth 3 (three) times daily as needed for cough. Patient not taking: Reported on 04/13/2019 02/03/18   [name], DO  benztropine (COGENTIN) 1 MG tablet Take 1 tablet (1 mg total) by mouth 2 (two) times daily. Patient not taking: Reported on 08/02/2019 04/20/19   Malvin Johns, MD  montelukast (SINGULAIR) 10 MG tablet Take 1 tablet (10 mg total) by mouth at bedtime. Patient not taking: Reported on 04/13/2019 02/02/18   [name], DO  propranolol (INDERAL) 20 MG tablet Take 1 tablet (20 mg total) by mouth 2 (two) times daily. Patient not taking: Reported on 08/02/2019 04/20/19   Malvin Johns, MD  risperiDONE (RISPERDAL) 4 MG tablet Take 1 tablet (4 mg total) by mouth at bedtime. Patient not taking: Reported on 08/02/2019 04/20/19   Malvin Johns, MD  SUMAtriptan (IMITREX) 100 MG tablet ! Tab at onset of HA. May repeat in 2 hours if headache persists or recurs.No more than 4 doses a month. Patient not taking: Reported on 04/13/2019 09/01/17   [name], MD  traZODone (DESYREL) 150 MG tablet Take 1 tablet (150 mg total) by mouth at bedtime as needed and may repeat dose one time if needed for sleep. Patient not taking: Reported on 08/02/2019 04/20/19   Malvin Johns, MD    Physical Exam: Constitutional: Moderately built and nourished. Vitals:   08/02/19 1902 08/02/19 1930 08/02/19 2000 08/02/19 2038  BP:  (!) 140/97 (!) 120/99 (!) 120/99  Pulse: (!) 40 90 (!) 49 68   Resp: (!) 24 (!) 25 (!) 29 (!) 26  Temp:      TempSrc:      SpO2: 98% 97% 97% 99%   Eyes: Anicteric no pallor. ENMT: No discharge from the ears eyes nose or mouth. Neck: No mass felt.  No neck rigidity. Respiratory: No rhonchi or crepitations. Cardiovascular: S1-S2 heard. Abdomen: Soft nontender bowel sounds present. Musculoskeletal: No edema. Skin: No rash. Neurologic: Alert awake oriented to time place and person.  Moves all extremities. Psychiatric: Appears normal per normal affect.   Labs on Admission: I have personally reviewed following labs and imaging studies  CBC: Recent Labs  Lab 08/02/19 1740  WBC  10.3  NEUTROABS 7.8*  HGB 12.2  HCT 39.6  MCV 85.0  PLT 293   Basic Metabolic Panel: Recent Labs  Lab 08/02/19 1740  NA 137  K 3.9  CL 106  CO2 21*  GLUCOSE 114*  BUN 10  CREATININE 0.93  CALCIUM 8.6*   GFR: CrCl cannot be calculated (Unknown ideal weight.). Liver Function Tests: No results for input(s): AST, ALT, ALKPHOS, BILITOT, PROT, ALBUMIN in the last 168 hours. No results for input(s): LIPASE, AMYLASE in the last 168 hours. No results for input(s): AMMONIA in the last 168 hours. Coagulation Profile: No results for input(s): INR, PROTIME in the last 168 hours. Cardiac Enzymes: No results for input(s): CKTOTAL, CKMB, CKMBINDEX, TROPONINI in the last 168 hours. BNP (last 3 results) No results for input(s): PROBNP in the last 8760 hours. HbA1C: No results for input(s): HGBA1C in the last 72 hours. CBG: No results for input(s): GLUCAP in the last 168 hours. Lipid Profile: No results for input(s): CHOL, HDL, LDLCALC, TRIG, CHOLHDL, LDLDIRECT in the last 72 hours. Thyroid Function Tests: Recent Labs    08/02/19 1740  TSH 2.735   Anemia Panel: No results for input(s): VITAMINB12, FOLATE, FERRITIN, TIBC, IRON, RETICCTPCT in the last 72 hours. Urine analysis:    Component Value Date/Time   COLORURINE YELLOW 04/15/2019 1355   APPEARANCEUR HAZY  (A) 04/15/2019 1355   LABSPEC 1.014 04/15/2019 1355   PHURINE 6.0 04/15/2019 1355   GLUCOSEU NEGATIVE 04/15/2019 1355   HGBUR NEGATIVE 04/15/2019 1355   BILIRUBINUR NEGATIVE 04/15/2019 1355   BILIRUBINUR negative 07/28/2016 1614   BILIRUBINUR neg 08/29/2014 1528   KETONESUR 20 (A) 04/15/2019 1355   PROTEINUR NEGATIVE 04/15/2019 1355   UROBILINOGEN 0.2 07/28/2016 1614   NITRITE NEGATIVE 04/15/2019 1355   LEUKOCYTESUR NEGATIVE 04/15/2019 1355   Sepsis Labs: @LABRCNTIP (procalcitonin:4,lacticidven:4) )No results found for this or any previous visit (from the past 240 hour(s)).   Radiological Exams on Admission: DG Chest 2 View  Result Date: 08/02/2019 CLINICAL DATA:  Shortness of breath and cough for 1 week. EXAM: CHEST - 2 VIEW COMPARISON:  03/17/2019 FINDINGS: The cardiac silhouette is normal in size and configuration. No mediastinal or hilar masses. No evidence of adenopathy. Clear lungs.  No pleural effusion or pneumothorax. Skeletal structures are intact. IMPRESSION: No active cardiopulmonary disease. Electronically Signed   By: 05/15/2019 M.D.   On: 08/02/2019 16:15   CT Angio Chest PE W/Cm &/Or Wo Cm  Result Date: 08/02/2019 CLINICAL DATA:  Asthma exacerbation.  Shortness of breath. EXAM: CT ANGIOGRAPHY CHEST WITH CONTRAST TECHNIQUE: Multidetector CT imaging of the chest was performed using the standard protocol during bolus administration of intravenous contrast. Multiplanar CT image reconstructions and MIPs were obtained to evaluate the vascular anatomy. CONTRAST:  08/04/2019 OMNIPAQUE IOHEXOL 350 MG/ML SOLN COMPARISON:  None. FINDINGS: Cardiovascular: Borderline heart size for age. No pericardial effusion. The aorta is normal in caliber. No dissection. No atherosclerotic calcifications. No definite coronary artery calcifications. The pulmonary arterial tree is fairly well opacified but study limited by significant breathing motion artifact. No large central pulmonary emboli are  identified. Mediastinum/Nodes: Borderline enlarged mediastinal and hilar lymph nodes could be inflammatory/reactive secondary to the lung findings. The esophagus is grossly normal. The thyroid gland is grossly normal. Lungs/Pleura: Examination limited by breathing motion artifact but there is a diffuse hazy interstitial and airspace process in the lungs with a somewhat perihilar predominant pattern which could suggest pulmonary edema or interstitial pneumonia. Very small pleural effusions. Upper Abdomen: Severe  and diffuse fatty infiltration of the liver. No hepatic lesions. The gallbladder is grossly normal. Surgical changes involving the stomach likely from prior gastric stapling procedure. No complicating features. Musculoskeletal: No breast masses, supraclavicular or axillary adenopathy. The bony thorax is intact. Review of the MIP images confirms the above findings. IMPRESSION: 1. Limited examination due to breathing motion artifact. 2. No large central pulmonary emboli are identified. 3. Normal thoracic aorta. 4. Diffuse hazy interstitial and airspace process in the lungs with a somewhat perihilar predominant pattern which could suggest pulmonary edema or interstitial pneumonia. 5. Very small bilateral pleural effusions. 6. Severe and diffuse fatty infiltration of the liver. Electronically Signed   By: Rudie Meyer M.D.   On: 08/02/2019 20:02    EKG: Independently reviewed.  A. fib with RVR.  Assessment/Plan Principal Problem:   Atrial fibrillation with RVR (HCC) Active Problems:   Hypoventilation associated with obesity syndrome (HCC)   History of asthma   Bipolar I disorder, most recent episode (or current) manic (HCC)    1. A. fib with RVR new onset -patient chads 2 vasc score of as of now is only 1 but depending on if patient has CHF or not we will continue to do.  For now I am starting patient on heparin.  Patient is on Cardizem infusion.  If patient does not convert back to sinus rhythm  will start date limiting medications orally.  Will consult cardiology. 2. Possible CHF 1 dose of Lasix was ordered.  Check 2D echo and assess EF.  Closely follow intake output Daily weights.  Will be precipitated by A. Fib. 3. History of asthma not wheezing currently. 4. Morbid obesity will need nutrition counseling.  CT scan was showing possibility of pneumonia but patient has no cough fever or leukocytosis.  Will check procalcitonin.   DVT prophylaxis: Heparin infusion. Code Status: Full code. Family Communication: Discussed with patient. Disposition Plan: Home. Consults called: Cardiology. Admission status: Observation.   Eduard Clos MD [facility] Pager [phone].  If 7PM-7AM, please contact night-coverage [email or web address] Password [facility]  08/02/2019, 9:11 PM

## 2019-08-02 NOTE — ED Notes (Signed)
ED Provider at bedside. 

## 2019-08-02 NOTE — ED Triage Notes (Signed)
Patient here from PCP with complaints of asthma exacerbation. States that her "asthma meds are not working". Did receive a shot of steroids with little relief.

## 2019-08-02 NOTE — ED Provider Notes (Signed)
[facility]-EMERGENCY DEPT Provider Note   CSN: [id number] Arrival date & time: 08/02/19  1443     History Chief Complaint  Patient presents with  . Asthma    Alyssa Blanchard is a 49 y.o. female with a past medical history of GERD, asthma sending to the ED with a chief complaint of shortness of breath and chest pain.  Reports for the past 6 days has been having intermittent right-sided chest pain, shortness of breath and a dry cough.  She initially thought this was due to her asthma but despite taking her inhalers she has not had any relief.  Her symptoms got worse 3 days ago.  She noticed at home that her heart rate would jump to the 150s and then decreased to the 40s.  She reports dyspnea on exertion.  Reports intermittent bilateral leg swelling that improves with elevation but this has been chronic for her.  Denies sick contacts with similar symptoms, fever, abdominal pain, anticoagulant use, supplemental oxygen use at baseline.  She states that her wheezing has gradually improved using her home inhalers. She was sent to the ED by her PCP office earlier today due to tachycardia.  HPI     Past Medical History:  Diagnosis Date  . Anxiety   . Asthma   . Depression   . GERD (gastroesophageal reflux disease)   . Heel spur    bilat  . History of bronchitis   . History of chicken pox   . History of urinary tract infection   . Migraines   . Plantar fasciitis    bilat  . STD (sexually transmitted disease)    chl hx & hsv 1&2  . Tremors of nervous system   . Urinary incontinence     Patient Active Problem List   Diagnosis Date Noted  . Bipolar I disorder, most recent episode (or current) manic (HCC) 04/15/2019  . Psychosis (HCC) 04/14/2019  . Not well controlled moderate persistent asthma 02/02/2018  . Chronic rhinitis 02/02/2018  . Status asthmaticus 11/29/2017  . Acute respiratory failure with hypoxia (HCC) 11/29/2017  . Gastroesophageal reflux disease  09/01/2017  . Cough 12/31/2016  . Dysuria 07/28/2016  . Clinical infection 07/28/2016  . History of asthma 04/17/2016  . Acute bronchitis 04/17/2016  . 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 Migraine without aura and without status migrainosus, not intractable 10/10/2014  . Migraine with aura and without status migrainosus, not intractable 08/29/2014  . 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 classified elsewhere 08/29/2014  . 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 Snorings 07/10/2014  . Sleep related headaches 07/10/2014  . Nocturia more than twice per night 07/10/2014  . Preventative health care 07/30/2012  . Morbid obesity (HCC) 07/30/2012    Past Surgical History:  Procedure Laterality Date  . LAPAROSCOPIC GASTRIC SLEEVE RESECTION N/A 11/27/2014   Procedure: LAPAROSCOPIC GASTRIC SLEEVE RESECTION WITH HIATAL HERNIA REPAIR UPPER ENDOSCOPY;  Surgeon: Luretha MurphyMatthew Martin, MD;  Location: [facility];  Service: General;  Laterality: N/A;  . TONSILLECTOMY  1978     OB History    Gravida  2   Para      Term      Preterm      AB  2   Living  0     SAB      TAB  2   Ectopic      Multiple      Live Births  Family History  Problem Relation Age of Onset  . Diabetes Mother   . Hypertension Mother   . Hyperlipidemia Mother   . Kidney disease Mother   . Cancer Father        pancreatic  . Hypertension Maternal Grandmother   . Diabetes Maternal Grandmother   . Heart failure Maternal Grandmother        CHF  . Heart attack Maternal Grandfather   . Hypertension Maternal Grandfather   . Hypertension Paternal Grandmother   . Alzheimer's disease Paternal Grandmother   . Hypertension Paternal Grandfather   . Cancer Maternal Uncle        melanoma  . Cancer Paternal Uncle        kidney    Social History   Tobacco Use  . Smoking status: Former Smoker    Packs/day: 0.25    Years: 5.00    Pack years: 1.25    Types: Cigarettes     Quit date: 03/10/2012    Years since quitting: 7.4  . Smokeless tobacco: Never Used  Substance Use Topics  . Alcohol use: Yes    Alcohol/week: 0.0 standard drinks    Comment: 1 a month  . Drug use: Yes    Types: Marijuana, Cocaine    Home Medications Prior to Admission medications   Medication Sig Start Date End Date Taking? Authorizing Provider  BREO ELLIPTA 200-25 MCG/INH AEPB Inhale 1 puff into the lungs daily. 07/30/19  Yes [provider]  ipratropium (ATROVENT) 0.03 % nasal spray Place 1 spray into both nostrils daily as needed for rhinitis.  03/31/19  Yes [provider]  levalbuterol (XOPENEX HFA) 45 MCG/ACT inhaler Inhale 2 puffs into the lungs every 6 (six) hours as needed for wheezing or shortness of breath.  06/28/19  Yes [provider]  methylPREDNISolone acetate (DEPO-MEDROL) 80 MG/ML injection Inject 80 mg into the muscle once.  08/02/19  Yes [provider]  pantoprazole (PROTONIX) 40 MG tablet Take 1 tablet (40 mg total) by mouth 2 (two) times daily. 04/20/19  Yes Johnn Hai, MD  albuterol (VENTOLIN HFA) 108 (90 Base) MCG/ACT inhaler INHALE 2 PUFFS INTO THE LUNGS EVERY 4 HOURS AS NEEDED FOR WHEEZING OR FOR SHORTNESS OF BREATH Patient not taking: Reported on 08/02/2019 12/11/18   Varney Biles, MD  ARIPiprazole ER (ABILIFY MAINTENA) 400 MG SRER injection Inject 2 mLs (400 mg total) into the muscle every 28 (twenty-eight) days. Due 3/5 Patient not taking: Reported on 08/02/2019 05/17/19   Johnn Hai, MD  benzonatate (TESSALON PERLES) 100 MG capsule Take 2 capsules (200 mg total) by mouth 3 (three) times daily as needed for cough. Patient not taking: Reported on 04/13/2019 02/03/18   Garnet Sierras, DO  benztropine (COGENTIN) 1 MG tablet Take 1 tablet (1 mg total) by mouth 2 (two) times daily. Patient not taking: Reported on 08/02/2019 04/20/19   Johnn Hai, MD  montelukast (SINGULAIR) 10 MG tablet Take 1 tablet (10 mg total) by mouth at  bedtime. Patient not taking: Reported on 04/13/2019 02/02/18   Garnet Sierras, DO  propranolol (INDERAL) 20 MG tablet Take 1 tablet (20 mg total) by mouth 2 (two) times daily. Patient not taking: Reported on 08/02/2019 04/20/19   Johnn Hai, MD  risperiDONE (RISPERDAL) 4 MG tablet Take 1 tablet (4 mg total) by mouth at bedtime. Patient not taking: Reported on 08/02/2019 04/20/19   Johnn Hai, MD  SUMAtriptan (IMITREX) 100 MG tablet ! Tab at onset of HA. May repeat in  2 hours if headache persists or recurs.No more than 4 doses a month. Patient not taking: Reported on 04/13/2019 09/01/17   [name], MD  traZODone (DESYREL) 150 MG tablet Take 1 tablet (150 mg total) by mouth at bedtime as needed and may repeat dose one time if needed for sleep. Patient not taking: Reported on 08/02/2019 04/20/19   Malvin Johns, MD    Allergies    Gabapentin, Topiramate, Imitrex [sumatriptan], Montelukast, and Other  Review of Systems   Review of Systems  Constitutional: Negative for appetite change, chills and fever.  HENT: Negative for ear pain, rhinorrhea, sneezing and sore throat.   Eyes: Negative for photophobia and visual disturbance.  Respiratory: Positive for cough and shortness of breath. Negative for chest tightness and wheezing.   Cardiovascular: Positive for chest pain. Negative for palpitations.  Gastrointestinal: Negative for abdominal pain, blood in stool, constipation, diarrhea, nausea and vomiting.  Genitourinary: Negative for dysuria, hematuria and urgency.  Musculoskeletal: Negative for myalgias.  Skin: Negative for rash.  Neurological: Negative for dizziness, weakness and light-headedness.    Physical Exam Updated Vital Signs BP (!) 120/99   Pulse (!) 49   Temp 99.4 F (37.4 C) (Oral)   Resp (!) 29   SpO2 97%   Physical Exam Vitals and nursing note reviewed.  Constitutional:      General: She is not in acute distress.    Appearance: She is well-developed. She is obese.   HENT:     Head: Normocephalic and atraumatic.     Nose: Nose normal.  Eyes:     General: No scleral icterus.       Left eye: No discharge.     Conjunctiva/sclera: Conjunctivae normal.  Cardiovascular:     Rate and Rhythm: Regular rhythm. Tachycardia present.     Heart sounds: Normal heart sounds. No murmur. No friction rub. No gallop.   Pulmonary:     Effort: Pulmonary effort is normal. No respiratory distress.     Breath sounds: Normal breath sounds.  Abdominal:     General: Bowel sounds are normal. There is no distension.     Palpations: Abdomen is soft.     Tenderness: There is no abdominal tenderness. There is no guarding.  Musculoskeletal:        General: Normal range of motion.     Cervical back: Normal range of motion and neck supple.  Skin:    General: Skin is warm and dry.     Findings: No rash.  Neurological:     Mental Status: She is alert.     Motor: No abnormal muscle tone.     Coordination: Coordination normal.     ED Results / Procedures / Treatments   Labs (all labs ordered are listed, but only abnormal results are displayed) Labs Reviewed  BASIC METABOLIC PANEL - Abnormal; Notable for the following components:      Result Value   CO2 21 (*)    Glucose, Bld 114 (*)    Calcium 8.6 (*)    All other components within normal limits  CBC WITH DIFFERENTIAL/PLATELET - Abnormal; Notable for the following components:   RDW 15.9 (*)    Neutro Abs 7.8 (*)    All other components within normal limits  D-DIMER, QUANTITATIVE (NOT AT [facility]) - Abnormal; Notable for the following components:   D-Dimer, Quant 1.11 (*)    All other components within normal limits  BRAIN NATRIURETIC PEPTIDE - Abnormal; Notable for the following components:  B Natriuretic Peptide 402.6 (*)    All other components within normal limits  SARS CORONAVIRUS 2 BY RT PCR (HOSPITAL ORDER, PERFORMED IN [facility])  TSH  MAGNESIUM  TROPONIN I (HIGH SENSITIVITY)  TROPONIN I (HIGH  SENSITIVITY)    EKG None  Radiology DG Chest 2 View  Result Date: 08/02/2019 CLINICAL DATA:  Shortness of breath and cough for 1 week. EXAM: CHEST - 2 VIEW COMPARISON:  03/17/2019 FINDINGS: The cardiac silhouette is normal in size and configuration. No mediastinal or hilar masses. No evidence of adenopathy. Clear lungs.  No pleural effusion or pneumothorax. Skeletal structures are intact. IMPRESSION: No active cardiopulmonary disease. Electronically Signed   By: Amie Portland M.D.   On: 08/02/2019 16:15   CT Angio Chest PE W/Cm &/Or Wo Cm  Result Date: 08/02/2019 CLINICAL DATA:  Asthma exacerbation.  Shortness of breath. EXAM: CT ANGIOGRAPHY CHEST WITH CONTRAST TECHNIQUE: Multidetector CT imaging of the chest was performed using the standard protocol during bolus administration of intravenous contrast. Multiplanar CT image reconstructions and MIPs were obtained to evaluate the vascular anatomy. CONTRAST:  OMNIPAQUE IOHEXOL 350 MG/ML SOLN COMPARISON:  None. FINDINGS: Cardiovascular: Borderline heart size for age. No pericardial effusion. The aorta is normal in caliber. No dissection. No atherosclerotic calcifications. No definite coronary artery calcifications. The pulmonary arterial tree is fairly well opacified but study limited by significant breathing motion artifact. No large central pulmonary emboli are identified. Mediastinum/Nodes: Borderline enlarged mediastinal and hilar lymph nodes could be inflammatory/reactive secondary to the lung findings. The esophagus is grossly normal. The thyroid gland is grossly normal. Lungs/Pleura: Examination limited by breathing motion artifact but there is a diffuse hazy interstitial and airspace process in the lungs with a somewhat perihilar predominant pattern which could suggest pulmonary edema or interstitial pneumonia. Very small pleural effusions. Upper Abdomen: Severe and diffuse fatty infiltration of the liver. No hepatic lesions. The gallbladder is  grossly normal. Surgical changes involving the stomach likely from prior gastric stapling procedure. No complicating features. Musculoskeletal: No breast masses, supraclavicular or axillary adenopathy. The bony thorax is intact. Review of the MIP images confirms the above findings. IMPRESSION: 1. Limited examination due to breathing motion artifact. 2. No large central pulmonary emboli are identified. 3. Normal thoracic aorta. 4. Diffuse hazy interstitial and airspace process in the lungs with a somewhat perihilar predominant pattern which could suggest pulmonary edema or interstitial pneumonia. 5. Very small bilateral pleural effusions. 6. Severe and diffuse fatty infiltration of the liver. Electronically Signed   By: Rudie Meyer M.D.   On: 08/02/2019 20:02    Procedures .Critical Care Performed by: Dietrich Pates, PA-C Authorized by: Dietrich Pates, PA-C   Critical care provider statement:    Critical care time (minutes):  35   Critical care was necessary to treat or prevent imminent or life-threatening deterioration of the following conditions:  Cardiac failure, circulatory failure and respiratory failure   Critical care was time spent personally by me on the following activities:  Development of treatment plan with patient or surrogate, discussions with consultants, evaluation of patient's response to treatment, examination of patient, interpretation of cardiac output measurements, ordering and review of laboratory studies, ordering and review of radiographic studies, pulse oximetry, re-evaluation of patient's condition and review of old charts   I assumed direction of critical care for this patient from another provider in my specialty: no     (including critical care time)  Medications Ordered in ED Medications  diltiazem (CARDIZEM) 1 mg/mL  load via infusion 15 mg (15 mg Intravenous Bolus from Bag 08/02/19 1822)    And  diltiazem (CARDIZEM) 125 mg in dextrose 5% 125 mL (1 mg/mL) infusion (15  mg/hr Intravenous Rate/Dose Change 08/02/19 1930)  furosemide (LASIX) injection 20 mg (has no administration in time range)  iohexol (OMNIPAQUE) 350 MG/ML injection 100 mL (100 mLs Intravenous Contrast Given 08/02/19 1940)    ED Course  I have reviewed the triage vital signs and the nursing notes.  Pertinent labs & imaging results that were available during my care of the patient were reviewed by me and considered in my medical decision making (see chart for details).    MDM Rules/Calculators/A&P                      Dianelys Scinto was evaluated in Emergency Department on 08/02/19 for the symptoms described in the history of present illness. He/she was evaluated in the context of the global COVID-19 pandemic, which necessitated consideration that the patient might be at risk for infection with the SARS-CoV-2 virus that causes COVID-19. Institutional protocols and algorithms that pertain to the evaluation of patients at risk for COVID-19 are in a state of rapid change based on information released by regulatory bodies including the CDC and federal and state organizations. These policies and algorithms were followed during the patient's care in the ED.  49 year old female with a past medical history of GERD, asthma presenting to the ED with chief complaint of shortness of breath and chest pain.  Reports 6-day history of the symptoms along with a dry cough.  Reports her wheezing has improved with her home inhalers.  Symptoms got worse 3 days ago.  She has noticed at home that her heart rate would jump to the 150s and then decreased to the 40s.  She was sent by her PCP for this as this was seen in the office today.  She reports dyspnea on exertion.  Reports bilateral leg swelling that improves with elevation but this is chronic for her.  On exam patient is tachycardic.  Lungs are clear on my exam.  No pitting edema noted to lower extremities.  EKG here shows A. fib with RVR, rates in the 160s.  Lab work  significant for D-dimer elevated at 1.1, BNP slightly elevated to 400s.  Troponin unremarkable x2.  BMP is unremarkable.  CT angio shows no evidence of PE but does show findings consistent with pleural effusions and pulmonary edema.  Patient was started on diltiazem infusion.  She will be given Lasix to help with edema.  She will require admission for work-up of this new onset of A. Fib.  No recent echo seen in the system and she denies seeing a cardiologist in the past.  All imaging, if done today, including plain films, CT scans, and ultrasounds, independently reviewed by me, and interpretations confirmed via formal radiology reads.  Portions of this note were generated with Scientist, clinical (histocompatibility and immunogenetics). Dictation errors may occur despite best attempts at proofreading.  Final Clinical Impression(s) / ED Diagnoses Final diagnoses:  Atrial fibrillation with RVR (HCC)  Acute pulmonary edema [facility])    Rx / DC Orders ED Discharge Orders    None       Dietrich Pates, PA-C 08/02/19 2045    Tilden Fossa, MD 08/04/19 [phone]

## 2019-08-02 NOTE — Progress Notes (Signed)
ANTICOAGULATION CONSULT NOTE - Initial Consult  Pharmacy Consult for Heparin Indication: atrial fibrillation  Allergies  Allergen Reactions  . Gabapentin Anaphylaxis  . Topiramate Other (See Comments)    Jaw tightness and chest discomfort   . Imitrex [Sumatriptan] Other (See Comments)    Increased heart rate  . Montelukast Other (See Comments)    Nightmares   . Other     Pt states no narcotics ever!     Patient Measurements:   Heparin Dosing Weight: 103.5 kg  Vital Signs: Temp: 99.4 F (37.4 C) (05/25 1833) Temp Source: Oral (05/25 1833) BP: 129/97 (05/25 2333) Pulse Rate: 67 (05/25 2333)  Labs: Recent Labs    08/02/19 1740 08/02/19 1928 08/02/19 2109  HGB 12.2  --   --   HCT 39.6  --   --   PLT 293  --   --   CREATININE 0.93  --   --   TROPONINIHS 8 9 8     CrCl cannot be calculated (Unknown ideal weight.).   Medical History: Past Medical History:  Diagnosis Date  . Anxiety   . Asthma   . Depression   . GERD (gastroesophageal reflux disease)   . Heel spur    bilat  . History of bronchitis   . History of chicken pox   . History of urinary tract infection   . Migraines   . Plantar fasciitis    bilat  . STD (sexually transmitted disease)    chl hx & hsv 1&2  . Tremors of nervous system   . Urinary incontinence     Medications:  Not on anticoagulation PTA  Assessment: 49 y/o F admitted with new-onset atrial fibrillation w/ rapid ventricular response to start anticoagulation with heparin infusion.   Goal of Therapy:  Heparin level 0.3-0.7 units/ml Monitor platelets by anticoagulation protocol: Yes   Plan:  Give 4000 units bolus x 1 Start heparin infusion at 1600 units/hr Check anti-Xa level in 6 hours and daily while on heparin Continue to monitor H&H and platelets  52 D 08/02/2019,11:45 PM

## 2019-08-02 NOTE — ED Notes (Signed)
Pt states she is feeling weak, and not well. Pt HR 180 currently. Pt moved to room 13. Dr. Madilyn Hook aware. Pt A&Ox4.

## 2019-08-03 ENCOUNTER — Other Ambulatory Visit: Payer: Self-pay

## 2019-08-03 ENCOUNTER — Inpatient Hospital Stay (HOSPITAL_COMMUNITY): Payer: BLUE CROSS/BLUE SHIELD

## 2019-08-03 ENCOUNTER — Encounter (HOSPITAL_COMMUNITY): Payer: Self-pay | Admitting: Internal Medicine

## 2019-08-03 DIAGNOSIS — Z20822 Contact with and (suspected) exposure to covid-19: Secondary | ICD-10-CM | POA: Diagnosis present

## 2019-08-03 DIAGNOSIS — I340 Nonrheumatic mitral (valve) insufficiency: Secondary | ICD-10-CM | POA: Diagnosis not present

## 2019-08-03 DIAGNOSIS — Z79899 Other long term (current) drug therapy: Secondary | ICD-10-CM | POA: Diagnosis not present

## 2019-08-03 DIAGNOSIS — K219 Gastro-esophageal reflux disease without esophagitis: Secondary | ICD-10-CM | POA: Diagnosis present

## 2019-08-03 DIAGNOSIS — J810 Acute pulmonary edema: Secondary | ICD-10-CM | POA: Insufficient documentation

## 2019-08-03 DIAGNOSIS — Z6841 Body Mass Index (BMI) 40.0 and over, adult: Secondary | ICD-10-CM | POA: Diagnosis not present

## 2019-08-03 DIAGNOSIS — Z8249 Family history of ischemic heart disease and other diseases of the circulatory system: Secondary | ICD-10-CM | POA: Diagnosis not present

## 2019-08-03 DIAGNOSIS — Z9884 Bariatric surgery status: Secondary | ICD-10-CM | POA: Diagnosis not present

## 2019-08-03 DIAGNOSIS — E662 Morbid (severe) obesity with alveolar hypoventilation: Secondary | ICD-10-CM | POA: Diagnosis present

## 2019-08-03 DIAGNOSIS — I5031 Acute diastolic (congestive) heart failure: Secondary | ICD-10-CM | POA: Diagnosis present

## 2019-08-03 DIAGNOSIS — Z8709 Personal history of other diseases of the respiratory system: Secondary | ICD-10-CM | POA: Diagnosis not present

## 2019-08-03 DIAGNOSIS — I480 Paroxysmal atrial fibrillation: Secondary | ICD-10-CM | POA: Diagnosis present

## 2019-08-03 DIAGNOSIS — J45909 Unspecified asthma, uncomplicated: Secondary | ICD-10-CM | POA: Diagnosis present

## 2019-08-03 DIAGNOSIS — F419 Anxiety disorder, unspecified: Secondary | ICD-10-CM | POA: Diagnosis present

## 2019-08-03 DIAGNOSIS — Z87891 Personal history of nicotine dependence: Secondary | ICD-10-CM | POA: Diagnosis not present

## 2019-08-03 DIAGNOSIS — Z7951 Long term (current) use of inhaled steroids: Secondary | ICD-10-CM | POA: Diagnosis not present

## 2019-08-03 DIAGNOSIS — F319 Bipolar disorder, unspecified: Secondary | ICD-10-CM | POA: Diagnosis present

## 2019-08-03 DIAGNOSIS — I4891 Unspecified atrial fibrillation: Secondary | ICD-10-CM | POA: Diagnosis not present

## 2019-08-03 DIAGNOSIS — Z888 Allergy status to other drugs, medicaments and biological substances status: Secondary | ICD-10-CM | POA: Diagnosis not present

## 2019-08-03 LAB — BASIC METABOLIC PANEL
Anion gap: 11 (ref 5–15)
BUN: 10 mg/dL (ref 6–20)
CO2: 22 mmol/L (ref 22–32)
Calcium: 8.5 mg/dL — ABNORMAL LOW (ref 8.9–10.3)
Chloride: 105 mmol/L (ref 98–111)
Creatinine, Ser: 0.99 mg/dL (ref 0.44–1.00)
GFR calc Af Amer: >60 mL/min (ref >60)
GFR calc non Af Amer: >60 mL/min (ref >60)
Glucose, Bld: 127 mg/dL — ABNORMAL HIGH (ref 70–99)
Potassium: 3.5 mmol/L (ref 3.5–5.1)
Sodium: 138 mmol/L (ref 135–145)

## 2019-08-03 LAB — ECHOCARDIOGRAM COMPLETE

## 2019-08-03 LAB — CBC
HCT: 35.4 % — ABNORMAL LOW (ref 36.0–46.0)
Hemoglobin: 10.8 g/dL — ABNORMAL LOW (ref 12.0–15.0)
MCH: 26.3 pg (ref 26.0–34.0)
MCHC: 30.5 g/dL (ref 30.0–36.0)
MCV: 86.3 fL (ref 80.0–100.0)
Platelets: 246 10*3/uL (ref 150–400)
RBC: 4.1 MIL/uL (ref 3.87–5.11)
RDW: 16 % — ABNORMAL HIGH (ref 11.5–15.5)
WBC: 9.7 10*3/uL (ref 4.0–10.5)
nRBC: 0 % (ref 0.0–0.2)

## 2019-08-03 LAB — PROCALCITONIN: Procalcitonin: <0.1 ng/mL

## 2019-08-03 LAB — HIV ANTIBODY (ROUTINE TESTING W REFLEX): HIV Screen 4th Generation wRfx: NONREACTIVE

## 2019-08-03 LAB — PROTIME-INR
INR: 1.1 (ref 0.8–1.2)
Prothrombin Time: 13.3 seconds (ref 11.4–15.2)

## 2019-08-03 LAB — MAGNESIUM: Magnesium: 1.8 mg/dL (ref 1.7–2.4)

## 2019-08-03 LAB — HEPARIN LEVEL (UNFRACTIONATED)
Heparin Unfractionated: 0.11 IU/mL — ABNORMAL LOW (ref 0.30–0.70)
Heparin Unfractionated: 0.31 IU/mL (ref 0.30–0.70)

## 2019-08-03 LAB — APTT: aPTT: 29 seconds (ref 24–36)

## 2019-08-03 MED ORDER — PERFLUTREN LIPID MICROSPHERE
1.0000 mL | INTRAVENOUS | Status: AC | PRN
  Filled 2019-08-03: qty 10

## 2019-08-03 MED ORDER — LEVALBUTEROL HCL 0.63 MG/3ML IN NEBU: 0.6300 mg | INHALATION_SOLUTION | Freq: Four times a day (QID) | RESPIRATORY_TRACT | Status: DC | PRN

## 2019-08-03 MED ORDER — METOPROLOL TARTRATE 25 MG PO TABS
25.0000 mg | ORAL_TABLET | Freq: Three times a day (TID) | ORAL | Status: DC
  Administered 2019-08-03 – 2019-08-04 (×3): 25 mg via ORAL
  Filled 2019-08-03 (×3): qty 1

## 2019-08-03 MED ORDER — POTASSIUM CHLORIDE CRYS ER 20 MEQ PO TBCR
20.0000 meq | EXTENDED_RELEASE_TABLET | Freq: Once | ORAL | Status: AC
  Administered 2019-08-03: 20 meq via ORAL
  Filled 2019-08-03: qty 1

## 2019-08-03 MED ORDER — HEPARIN BOLUS VIA INFUSION
4000.0000 [IU] | Freq: Once | INTRAVENOUS | Status: AC
  Administered 2019-08-03: 4000 [IU] via INTRAVENOUS
  Filled 2019-08-03: qty 4000

## 2019-08-03 MED ORDER — METOPROLOL TARTRATE 25 MG PO TABS
12.5000 mg | ORAL_TABLET | Freq: Three times a day (TID) | ORAL | Status: DC
  Administered 2019-08-03: 12.5 mg via ORAL
  Filled 2019-08-03: qty 1

## 2019-08-03 MED ORDER — POTASSIUM CHLORIDE CRYS ER 20 MEQ PO TBCR: 20.0000 meq | EXTENDED_RELEASE_TABLET | Freq: Once | ORAL | Status: DC

## 2019-08-03 MED ORDER — HEPARIN (PORCINE) 25000 UT/250ML-% IV SOLN
2200.0000 [IU]/h | INTRAVENOUS | Status: DC
  Administered 2019-08-03: 2000 [IU]/h via INTRAVENOUS
  Administered 2019-08-04: 2200 [IU]/h via INTRAVENOUS
  Administered 2019-08-04: 2000 [IU]/h via INTRAVENOUS
  Filled 2019-08-03 (×3): qty 250

## 2019-08-03 NOTE — Consult Note (Addendum)
Cardiology Consultation:   Patient ID: Alyssa Blanchard MRN: [id number]; DOB: 1970/12/05  Admit date: 08/02/2019 Date of Consult: 08/03/2019  Primary Care Provider: Horald Pollen, MD Primary Cardiologist: No primary care provider on file. new Primary Electrophysiologist:  None    Patient Profile:   Alyssa Blanchard is a 49 y.o. female with a hx of morbid obesity, GERD, bipolar disorder and obesity hypoventilation who is being seen today for the evaluation of new onset atrialfibrillation at the request of Dr. Hal Hope.  History of Present Illness:   Alyssa Blanchard was in her usual state of health until 3 days ago when she developed shortness of breath.  She had difficulty ambulating even across the room.  She noted that her heart rate was faster than usual.  It got up to nearly 200 bpm.  She decided to see her PCP and was noted to be in atrial fibrillation.  She was directed to the emergency department.  This is the first time she ever had any issues with her heart.  She is also noted some lower extremity edema, orthopnea, or and PND.  When her heart was going fast she had some substernal chest pressure but this has subsided.  She does not get much exercise at baseline.  She had gastric bypass in the 2000 and has always struggled with her weight.  She denies heavy alcohol intake and does not drink any caffeine.  She does have asthma but uses Xopenex as she does not like her heart racing when she uses albuterol.  In the ED she was noted to be in atrial fibrillation with rapid ventricular spots.  Thyroid was normal.  Cardiac enzymes were negative.  BNP was elevated at 402.  Chest CTA was notable for pulmonary edema, no PE, and study liver disease.  She received IV Lasix and was started on a diltiazem drip.  Her heart rate remains poorly controlled but she is feeling much better.   Past Medical History:  Diagnosis Date  . Anxiety   . Asthma   . Depression   . GERD (gastroesophageal reflux disease)     . Heel spur    bilat  . History of bronchitis   . History of chicken pox   . History of urinary tract infection   . Migraines   . Plantar fasciitis    bilat  . STD (sexually transmitted disease)    chl hx & hsv 1&2  . Tremors of nervous system   . Urinary incontinence     Past Surgical History:  Procedure Laterality Date  . LAPAROSCOPIC GASTRIC SLEEVE RESECTION N/A 11/27/2014   Procedure: LAPAROSCOPIC GASTRIC SLEEVE RESECTION WITH HIATAL HERNIA REPAIR UPPER ENDOSCOPY;  Surgeon: Johnathan Hausen, MD;  Location: [facility];  Service: General;  Laterality: N/A;  . TONSILLECTOMY  1978     Home Medications:  Prior to Admission medications   Medication Sig Start Date End Date Taking? Authorizing Provider  BREO ELLIPTA 200-25 MCG/INH AEPB Inhale 1 puff into the lungs daily. 07/30/19  Yes [provider]  ipratropium (ATROVENT) 0.03 % nasal spray Place 1 spray into both nostrils daily as needed for rhinitis.  03/31/19  Yes [provider]  levalbuterol (XOPENEX HFA) 45 MCG/ACT inhaler Inhale 2 puffs into the lungs every 6 (six) hours as needed for wheezing or shortness of breath.  06/28/19  Yes [provider]  methylPREDNISolone acetate (DEPO-MEDROL) 80 MG/ML injection Inject 80 mg into the muscle once.  08/02/19  Yes [provider]  pantoprazole (  PROTONIX) 40 MG tablet Take 1 tablet (40 mg total) by mouth 2 (two) times daily. 04/20/19  Yes Malvin JohnsFarah, Brian, MD  albuterol (VENTOLIN HFA) 108 (90 Base) MCG/ACT inhaler INHALE 2 PUFFS INTO THE LUNGS EVERY 4 HOURS AS NEEDED FOR WHEEZING OR FOR SHORTNESS OF BREATH Patient not taking: Reported on 08/02/2019 12/11/18   Derwood KaplanNanavati, Ankit, MD  ARIPiprazole ER (ABILIFY MAINTENA) 400 MG SRER injection Inject 2 mLs (400 mg total) into the muscle every 28 (twenty-eight) days. Due 3/5 Patient not taking: Reported on 08/02/2019 05/17/19   Malvin JohnsFarah, Brian, MD  benzonatate (TESSALON PERLES) 100 MG capsule Take 2 capsules (200 mg total) by mouth  3 (three) times daily as needed for cough. Patient not taking: Reported on 04/13/2019 02/03/18   Ellamae SiaKim, Yoon M, DO  benztropine (COGENTIN) 1 MG tablet Take 1 tablet (1 mg total) by mouth 2 (two) times daily. Patient not taking: Reported on 08/02/2019 04/20/19   Malvin JohnsFarah, Brian, MD  montelukast (SINGULAIR) 10 MG tablet Take 1 tablet (10 mg total) by mouth at bedtime. Patient not taking: Reported on 04/13/2019 02/02/18   Ellamae SiaKim, Yoon M, DO  propranolol (INDERAL) 20 MG tablet Take 1 tablet (20 mg total) by mouth 2 (two) times daily. Patient not taking: Reported on 08/02/2019 04/20/19   Malvin JohnsFarah, Brian, MD  risperiDONE (RISPERDAL) 4 MG tablet Take 1 tablet (4 mg total) by mouth at bedtime. Patient not taking: Reported on 08/02/2019 04/20/19   Malvin JohnsFarah, Brian, MD  SUMAtriptan (IMITREX) 100 MG tablet ! Tab at onset of HA. May repeat in 2 hours if headache persists or recurs.No more than 4 doses a month. Patient not taking: Reported on 04/13/2019 09/01/17   Georgina QuintSagardia, Miguel Jose, MD  traZODone (DESYREL) 150 MG tablet Take 1 tablet (150 mg total) by mouth at bedtime as needed and may repeat dose one time if needed for sleep. Patient not taking: Reported on 08/02/2019 04/20/19   Malvin JohnsFarah, Brian, MD    Inpatient Medications: Scheduled Meds: . fluticasone furoate-vilanterol  1 puff Inhalation Daily   Continuous Infusions: . diltiazem (CARDIZEM) infusion 15 mg/hr (08/03/19 1008)  . heparin 2,000 Units/hr (08/03/19 1020)   PRN Meds: levalbuterol, ondansetron **OR** ondansetron (ZOFRAN) IV, perflutren lipid microspheres (DEFINITY) IV suspension  Allergies:    Allergies  Allergen Reactions  . Gabapentin Anaphylaxis  . Topiramate Other (See Comments)    Jaw tightness and chest discomfort   . Imitrex [Sumatriptan] Other (See Comments)    Increased heart rate  . Montelukast Other (See Comments)    Nightmares   . Other     Pt states no narcotics ever!     Social History:   Social History   Socioeconomic History  . Marital  status: Single    Spouse name: Not on file  . Number of children: Not on file  . Years of education: 5712  . Highest education level: Not on file  Occupational History  . Occupation: Chartered certified accountantCosmetics    Employer: [employer]  Tobacco Use  . Smoking status: Former Smoker    Packs/day: 0.25    Years: 5.00    Pack years: 1.25    Types: Cigarettes    Quit date: 03/10/2012    Years since quitting: 7.4  . Smokeless tobacco: Never Used  Substance and Sexual Activity  . Alcohol use: Yes    Alcohol/week: 0.0 standard drinks    Comment: 1 a month  . Drug use: Yes    Types: Marijuana, Cocaine  . Sexual activity: Yes  Partners: Male    Birth control/protection: Condom  Other Topics Concern  . Not on file  Social History Narrative   Regular exercise-no   Caffeine Use-yes, 1-2 cups of caffeine daily   Social Determinants of Health   Financial Resource Strain:   . Difficulty of Paying Living Expenses:   Food Insecurity:   . Worried About Programme researcher, broadcasting/film/video in the Last Year:   . Barista in the Last Year:   Transportation Needs:   . Freight forwarder (Medical):   Marland Kitchen Lack of Transportation (Non-Medical):   Physical Activity:   . Days of Exercise per Week:   . Minutes of Exercise per Session:   Stress:   . Feeling of Stress :   Social Connections:   . Frequency of Communication with Friends and Family:   . Frequency of Social Gatherings with Friends and Family:   . Attends Religious Services:   . Active Member of Clubs or Organizations:   . Attends Banker Meetings:   Marland Kitchen Marital Status:   Intimate Partner Violence:   . Fear of Current or Ex-Partner:   . Emotionally Abused:   Marland Kitchen Physically Abused:   . Sexually Abused:     Family History:    Family History  Problem Relation Age of Onset  . Diabetes Mother   . Hypertension Mother   . Hyperlipidemia Mother   . Kidney disease Mother   . Cancer Father        pancreatic  . Hypertension Maternal  Grandmother   . Diabetes Maternal Grandmother   . Heart failure Maternal Grandmother        CHF  . Heart attack Maternal Grandfather   . Hypertension Maternal Grandfather   . Hypertension Paternal Grandmother   . Alzheimer's disease Paternal Grandmother   . Hypertension Paternal Grandfather   . Cancer Maternal Uncle        melanoma  . Cancer Paternal Uncle        kidney     ROS:  Please see the history of present illness.   All other ROS reviewed and negative.     Physical Exam/Data:   Vitals:   08/03/19 0808 08/03/19 1025 08/03/19 1027 08/03/19 1100  BP: 124/81  99/65 101/80  Pulse: (!) 156 (!) 124 (!) 117 (!) 104  Resp: 18 18 20 17   Temp:      TempSrc:      SpO2: 98% 95% 95% 97%    Intake/Output Summary (Last 24 hours) at 08/03/2019 1244 Last data filed at 08/03/2019 1031 Gross per 24 hour  Intake --  Output 2500 ml  Net -2500 ml   Last 3 Weights 04/13/2019 02/02/2018 01/22/2018  Weight (lbs) 364 lb 340 lb 339 lb 6.4 oz  Weight (kg) 165.109 kg 154.223 kg 153.951 kg  Some encounter information is confidential and restricted. Go to Review Flowsheets activity to see all data.     VS:  BP 101/80   Pulse (!) 104   Temp 99.4 F (37.4 C) (Oral)   Resp 17   SpO2 97%  , BMI There is no height or weight on file to calculate BMI. GENERAL:  Well appearing.  Morbidly obese. HEENT: Pupils equal round and reactive, fundi not visualized, oral mucosa unremarkable NECK:  No jugular venous distention, waveform within normal limits, carotid upstroke brisk and symmetric, no bruits LUNGS:  Clear to auscultation bilaterally HEART:  Tachycardic.  Irregularly irregular.  PMI not displaced or sustained,S1 and  S2 within normal limits, no S3, no S4, no clicks, no rubs, no murmurs ABD:  Flat, positive bowel sounds normal in frequency in pitch, no bruits, no rebound, no guarding, no midline pulsatile mass, no hepatomegaly, no splenomegaly EXT:  2 plus pulses throughout, no edema, no cyanosis  no clubbing SKIN:  No rashes no nodules NEURO:  Cranial nerves II through XII grossly intact, motor grossly intact throughout PSYCH:  Cognitively intact, oriented to person place and time   EKG:  The EKG was personally reviewed and demonstrates:  Atrial fibrillation.  Rate 148 bpm.  Low voltage precordial leads.  QTc 493 ms. Telemetry:  Telemetry was personally reviewed and demonstrates: Atrial fibrillation.  Rate >100 bpm  Relevant CV Studies: Echo pending   Laboratory Data:  High Sensitivity Troponin:   Recent Labs  Lab 08/02/19 1740 08/02/19 1928 08/02/19 2109  TROPONINIHS 8 9 8      Chemistry Recent Labs  Lab 08/02/19 1740 08/03/19 0500  NA 137 138  K 3.9 3.5  CL 106 105  CO2 21* 22  GLUCOSE 114* 127*  BUN 10 10  CREATININE 0.93 0.99  CALCIUM 8.6* 8.5*  GFRNONAA >60 >60  GFRAA >60 >60  ANIONGAP 10 11    No results for input(s): PROT, ALBUMIN, AST, ALT, ALKPHOS, BILITOT in the last 168 hours. Hematology Recent Labs  Lab 08/02/19 1740 08/03/19 0500  WBC 10.3 9.7  RBC 4.66 4.10  HGB 12.2 10.8*  HCT 39.6 35.4*  MCV 85.0 86.3  MCH 26.2 26.3  MCHC 30.8 30.5  RDW 15.9* 16.0*  PLT 293 246   BNP Recent Labs  Lab 08/02/19 1854  BNP 402.6*    DDimer  Recent Labs  Lab 08/02/19 1740  DDIMER 1.11*     Radiology/Studies:  DG Chest 2 View  Result Date: 08/02/2019 CLINICAL DATA:  Shortness of breath and cough for 1 week. EXAM: CHEST - 2 VIEW COMPARISON:  03/17/2019 FINDINGS: The cardiac silhouette is normal in size and configuration. No mediastinal or hilar masses. No evidence of adenopathy. Clear lungs.  No pleural effusion or pneumothorax. Skeletal structures are intact. IMPRESSION: No active cardiopulmonary disease. Electronically Signed   By: 05/15/2019 M.D.   On: 08/02/2019 16:15   CT Angio Chest PE W/Cm &/Or Wo Cm  Result Date: 08/02/2019 CLINICAL DATA:  Asthma exacerbation.  Shortness of breath. EXAM: CT ANGIOGRAPHY CHEST WITH CONTRAST  TECHNIQUE: Multidetector CT imaging of the chest was performed using the standard protocol during bolus administration of intravenous contrast. Multiplanar CT image reconstructions and MIPs were obtained to evaluate the vascular anatomy. CONTRAST:  08/04/2019 OMNIPAQUE IOHEXOL 350 MG/ML SOLN COMPARISON:  None. FINDINGS: Cardiovascular: Borderline heart size for age. No pericardial effusion. The aorta is normal in caliber. No dissection. No atherosclerotic calcifications. No definite coronary artery calcifications. The pulmonary arterial tree is fairly well opacified but study limited by significant breathing motion artifact. No large central pulmonary emboli are identified. Mediastinum/Nodes: Borderline enlarged mediastinal and hilar lymph nodes could be inflammatory/reactive secondary to the lung findings. The esophagus is grossly normal. The thyroid gland is grossly normal. Lungs/Pleura: Examination limited by breathing motion artifact but there is a diffuse hazy interstitial and airspace process in the lungs with a somewhat perihilar predominant pattern which could suggest pulmonary edema or interstitial pneumonia. Very small pleural effusions. Upper Abdomen: Severe and diffuse fatty infiltration of the liver. No hepatic lesions. The gallbladder is grossly normal. Surgical changes involving the stomach likely from prior gastric stapling procedure. No complicating  features. Musculoskeletal: No breast masses, supraclavicular or axillary adenopathy. The bony thorax is intact. Review of the MIP images confirms the above findings. IMPRESSION: 1. Limited examination due to breathing motion artifact. 2. No large central pulmonary emboli are identified. 3. Normal thoracic aorta. 4. Diffuse hazy interstitial and airspace process in the lungs with a somewhat perihilar predominant pattern which could suggest pulmonary edema or interstitial pneumonia. 5. Very small bilateral pleural effusions. 6. Severe and diffuse fatty  infiltration of the liver. Electronically Signed   By: Rudie Meyer M.D.   On: 08/02/2019 20:02   {  Assessment and Plan:   # Paroxysmal atrial fibrillation: Rates remain poorly controlled despite IV diltiazem at 15 mg/h.  We will add metoprolol 12.5 mg every 8 hours.  However her blood pressure is soft.  I suspect this will not get her to goal.  We may have to add amiodarone.  She will need TEE cardioversion prior to discharge if she does not convert on her own.  Echo is pending.  Continue heparin for now.  Thyroid is normal.  There is no evidence of heavy caffeine intake or alcohol.  We discussed the fact that obesity is a predisposing factor to atrial fibrillation.  This has been a lifelong struggle for her.  Fortunately she already uses Xopenex for her asthma instead of albuterol.  # Obesity hypoventilation:  CPAP/BiPAP as recommended by sleep medicine team.  For questions or updates, please contact [facility] Please consult [email or web address] for contact info under     Signed, Chilton Si, MD  08/03/2019 12:44 PM

## 2019-08-03 NOTE — Progress Notes (Signed)
ANTICOAGULATION CONSULT NOTE  Pharmacy Consult for Heparin Indication: atrial fibrillation  Allergies  Allergen Reactions  . Gabapentin Anaphylaxis  . Topiramate Other (See Comments)    Jaw tightness and chest discomfort   . Imitrex [Sumatriptan] Other (See Comments)    Increased heart rate  . Montelukast Other (See Comments)    Nightmares   . Other     Pt states no narcotics ever!     Patient Measurements: most recent weight 176 kg in Feb 2021   Heparin Dosing Weight: 103.5 kg  Vital Signs: BP: 124/81 (05/26 0808) Pulse Rate: 156 (05/26 0808)  Labs: Recent Labs    08/02/19 1740 08/02/19 1928 08/02/19 2109 08/03/19 0134 08/03/19 0500 08/03/19 0915  HGB 12.2  --   --   --  10.8*  --   HCT 39.6  --   --   --  35.4*  --   PLT 293  --   --   --  246  --   APTT  --   --   --  29  --   --   LABPROT  --   --   --  13.3  --   --   INR  --   --   --  1.1  --   --   HEPARINUNFRC  --   --   --   --   --  0.11*  CREATININE 0.93  --   --   --  0.99  --   TROPONINIHS 8 9 8   --   --   --     CrCl cannot be calculated (Unknown ideal weight.).   Medications:  Not on anticoagulation PTA  Assessment: 49 y/o F admitted with new-onset atrial fibrillation w/ rapid ventricular response to start anticoagulation with heparin infusion.    Baseline INR, aPTT: WNL  Prior anticoagulation: none  Significant events:  Today, 08/03/2019:  CBC: Hgb slightly low after hydration, Plt stable WNL  Most recent heparin level SUBtherapeutic on 1600 units/hr  No bleeding or infusion issues per nursing (confirmed no extended pauses, occluded lines, leaking IV, etc)  Goal of Therapy: Heparin level 0.3-0.7 units/ml Monitor platelets by anticoagulation protocol: Yes  Plan:  Repeat heparin 4000 units IV bolus x 1  Increase heparin IV infusion to 2000 units/hr  Check heparin level 8 hrs after rate change (allow extra time for drug distribution)  Daily CBC, daily heparin level once  stable  Monitor for signs of bleeding or thrombosis  08/05/2019, PharmD, BCPS [phone] 08/03/2019, 9:57 AM

## 2019-08-03 NOTE — ED Notes (Signed)
Attempted to call report at this time 

## 2019-08-03 NOTE — Progress Notes (Signed)
PROGRESS NOTE    Alyssa Blanchard    Code Status: Full Code  ZOX:[id number] DOB: 1970-08-31 DOA: 08/02/2019 LOS: 0 days  PCP: Georgina QuintSagardia, Miguel Jose, MD CC:  Chief Complaint  Patient presents with  . Asthma       Hospital Summary   This is a 49 year old female with history of asthma who was experiencing increased shortness of breath on exertion over the past 4 to 5 days with some lower extremity edema and chest tightness but no chest pain.  Went to her PCP on 5/25 who noted that she was tachycardic and was referred to the ED.Marland Kitchen.  ED Course: In the ER patient was found to be in A. fib with RVR and was started on Cardizem infusion.  TSH was normal high sensitive troponin was negative.  BNP was 402.  CT angiogram of the chest shows features concerning for possible CHF with some effusion.  Pneumonia cannot be ruled out but patient was afebrile and had no leukocytosis.  Patient was given 1 dose of Lasix admitted for further management of A. fib with RVR which is new onset.   5/26: Cardiology consulted  A & P   Principal Problem:   Atrial fibrillation with RVR (HCC) Active Problems:   Hypoventilation associated with obesity syndrome (HCC)   History of asthma   Bipolar I disorder, most recent episode (or current) manic (HCC)   1. Paroxysmal atrial fibrillation with RVR a. Poorly controlled despite Cardizem drip at 15 mg/h b. Cardiology consulted: Added metoprolol 12.5 mg every 8 hours.  May need to add on amiodarone and may need cardioversion if she does not convert on her own prior to discharge.  Continue heparin for now c. TSH unremarkable d. Echo: Overall difficult visualization but with EF 60 to 65% otherwise unremarkable.  2. History of asthma not currently in exacerbation  3. Morbid obesity a. Risk factor for A. fib b. Outpatient follow-up  4. Volume overload, likely A. fib with RVR induced a. Plan status improved with Lasix and echo as above  DVT prophylaxis: Heparin Family  Communication: Patient updated at bedside Disposition Plan:  Status is: Inpatient  Remains inpatient appropriate because:IV treatments appropriate due to intensity of illness or inability to take PO   Dispo: The patient is from: Home              Anticipated d/c is to: Home              Anticipated d/c date is: 2 days              Patient currently is not medically stable to d/c.           Pressure injury documentation    None  Consultants  Cardiology   Procedures  None  Antibiotics   Anti-infectives (From admission, onward)   None        Subjective   Seen and examined at bedside in ED no acute distress resting comfortably.  States that she has had improved symptoms since her presentation.  States that her heart rate was 220s at home and it PCP, currently 150s.  No chest pain or palpitations currently.  No other issues.  Objective   Vitals:   08/03/19 1452 08/03/19 1500 08/03/19 1600 08/03/19 1646  BP: 120/81 (!) 111/97 (!) 119/99 (!) 119/99  Pulse: (!) 122   (!) 110  Resp: 18 (!) 25 (!) 28 19  Temp:    98.8 F (37.1 C)  TempSrc:  Oral  SpO2: 95%   97%    Intake/Output Summary (Last 24 hours) at 08/03/2019 1657 Last data filed at 08/03/2019 1502 Gross per 24 hour  Intake --  Output 3350 ml  Net -3350 ml   There were no vitals filed for this visit.  Examination:  Physical Exam Vitals and nursing note reviewed.  Constitutional:      General: She is not in acute distress.    Appearance: She is obese.  HENT:     Head: Normocephalic.  Eyes:     Conjunctiva/sclera: Conjunctivae normal.  Cardiovascular:     Rate and Rhythm: Tachycardia present. Rhythm irregular.  Pulmonary:     Effort: Pulmonary effort is normal. No respiratory distress.     Breath sounds: No wheezing or rales.  Abdominal:     General: There is no distension.     Palpations: Abdomen is soft.  Musculoskeletal:        General: No swelling or tenderness.  Neurological:      Mental Status: She is alert. Mental status is at baseline.  Psychiatric:        Mood and Affect: Mood normal.        Behavior: Behavior normal.     Data Reviewed: I have personally reviewed following labs and imaging studies  CBC: Recent Labs  Lab 08/02/19 1740 08/03/19 0500  WBC 10.3 9.7  NEUTROABS 7.8*  --   HGB 12.2 10.8*  HCT 39.6 35.4*  MCV 85.0 86.3  PLT 293 371   Basic Metabolic Panel: Recent Labs  Lab 08/02/19 1740 08/02/19 1752 08/03/19 0500 08/03/19 0527  NA 137  --  138  --   K 3.9  --  3.5  --   CL 106  --  105  --   CO2 21*  --  22  --   GLUCOSE 114*  --  127*  --   BUN 10  --  10  --   CREATININE 0.93  --  0.99  --   CALCIUM 8.6*  --  8.5*  --   MG  --  2.0  --  1.8   GFR: CrCl cannot be calculated (Unknown ideal weight.). Liver Function Tests: No results for input(s): AST, ALT, ALKPHOS, BILITOT, PROT, ALBUMIN in the last 168 hours. No results for input(s): LIPASE, AMYLASE in the last 168 hours. No results for input(s): AMMONIA in the last 168 hours. Coagulation Profile: Recent Labs  Lab 08/03/19 0134  INR 1.1   Cardiac Enzymes: No results for input(s): CKTOTAL, CKMB, CKMBINDEX, TROPONINI in the last 168 hours. BNP (last 3 results) No results for input(s): PROBNP in the last 8760 hours. HbA1C: No results for input(s): HGBA1C in the last 72 hours. CBG: No results for input(s): GLUCAP in the last 168 hours. Lipid Profile: No results for input(s): CHOL, HDL, LDLCALC, TRIG, CHOLHDL, LDLDIRECT in the last 72 hours. Thyroid Function Tests: Recent Labs    08/02/19 1740  TSH 2.735   Anemia Panel: No results for input(s): VITAMINB12, FOLATE, FERRITIN, TIBC, IRON, RETICCTPCT in the last 72 hours. Sepsis Labs: Recent Labs  Lab 08/03/19 0527  PROCALCITON <0.10    Recent Results (from the past 240 hour(s))  SARS Coronavirus 2 by RT PCR (hospital order, performed in [facility] hospital lab) Nasopharyngeal Nasopharyngeal Swab     Status:  None   Collection Time: 08/02/19  8:31 PM   Specimen: Nasopharyngeal Swab  Result Value Ref Range Status   SARS Coronavirus 2 NEGATIVE NEGATIVE  Final    Comment: (NOTE) SARS-CoV-2 target nucleic acids are NOT DETECTED. The SARS-CoV-2 RNA is generally detectable in upper and lower respiratory specimens during the acute phase of infection. The lowest concentration of SARS-CoV-2 viral copies this assay can detect is 250 copies / mL. A negative result does not preclude SARS-CoV-2 infection and should not be used as the sole basis for treatment or other patient management decisions.  A negative result may occur with improper specimen collection / handling, submission of specimen other than nasopharyngeal swab, presence of viral mutation(s) within the areas targeted by this assay, and inadequate number of viral copies (<250 copies / mL). A negative result must be combined with clinical observations, patient history, and epidemiological information. Fact Sheet for Patients:   BoilerBrush.com.cy Fact Sheet for Healthcare Providers: https://pope.com/ This test is not yet approved or cleared  by the [place] FDA and has been authorized for detection and/or diagnosis of SARS-CoV-2 by FDA under an Emergency Use Authorization (EUA).  This EUA will remain in effect (meaning this test can be used) for the duration of the COVID-19 declaration under Section 564(b)(1) of the Act, 21 U.S.C. section 360bbb-3(b)(1), unless the authorization is terminated or revoked sooner. Performed at [place] [facility], [address] [place], [place] [address]          Radiology Studies: DG Chest 2 View  Result Date: 08/02/2019 CLINICAL DATA:  Shortness of breath and cough for 1 week. EXAM: CHEST - 2 VIEW COMPARISON:  03/17/2019 FINDINGS: The cardiac silhouette is normal in size and configuration. No mediastinal or hilar masses. No evidence of  adenopathy. Clear lungs.  No pleural effusion or pneumothorax. Skeletal structures are intact. IMPRESSION: No active cardiopulmonary disease. Electronically Signed   By: Amie Portland M.D.   On: 08/02/2019 16:15   CT Angio Chest PE W/Cm &/Or Wo Cm  Result Date: 08/02/2019 CLINICAL DATA:  Asthma exacerbation.  Shortness of breath. EXAM: CT ANGIOGRAPHY CHEST WITH CONTRAST TECHNIQUE: Multidetector CT imaging of the chest was performed using the standard protocol during bolus administration of intravenous contrast. Multiplanar CT image reconstructions and MIPs were obtained to evaluate the vascular anatomy. CONTRAST:  OMNIPAQUE IOHEXOL 350 MG/ML SOLN COMPARISON:  None. FINDINGS: Cardiovascular: Borderline heart size for age. No pericardial effusion. The aorta is normal in caliber. No dissection. No atherosclerotic calcifications. No definite coronary artery calcifications. The pulmonary arterial tree is fairly well opacified but study limited by significant breathing motion artifact. No large central pulmonary emboli are identified. Mediastinum/Nodes: Borderline enlarged mediastinal and hilar lymph nodes could be inflammatory/reactive secondary to the lung findings. The esophagus is grossly normal. The thyroid gland is grossly normal. Lungs/Pleura: Examination limited by breathing motion artifact but there is a diffuse hazy interstitial and airspace process in the lungs with a somewhat perihilar predominant pattern which could suggest pulmonary edema or interstitial pneumonia. Very small pleural effusions. Upper Abdomen: Severe and diffuse fatty infiltration of the liver. No hepatic lesions. The gallbladder is grossly normal. Surgical changes involving the stomach likely from prior gastric stapling procedure. No complicating features. Musculoskeletal: No breast masses, supraclavicular or axillary adenopathy. The bony thorax is intact. Review of the MIP images confirms the above findings. IMPRESSION: 1. Limited  examination due to breathing motion artifact. 2. No large central pulmonary emboli are identified. 3. Normal thoracic aorta. 4. Diffuse hazy interstitial and airspace process in the lungs with a somewhat perihilar predominant pattern which could suggest pulmonary edema or interstitial pneumonia. 5. Very small bilateral  pleural effusions. 6. Severe and diffuse fatty infiltration of the liver. Electronically Signed   By: Rudie Meyer M.D.   On: 08/02/2019 20:02   ECHOCARDIOGRAM COMPLETE  Result Date: 08/03/2019    ECHOCARDIOGRAM REPORT   Patient Name:   Alyssa Blanchard Date of Exam: 08/03/2019 Medical Rec #:  [id number]   Height:       66.0 in Accession #:    [id number]  Weight:       389.0 lb Date of Birth:  [birth year]   BSA:          2.653 m Patient Age:    48 years    BP:           99/65 mmHg Patient Gender: F           HR:           117 bpm. Exam Location:  Inpatient Procedure: 2D Echo Indications:    atrial fibrillation 427.31  History:        Patient has no prior history of Echocardiogram examinations.                 Risk Factors:Former Smoker.  Sonographer:    Celene Skeen RDCS (AE) Referring Phys: [id number] Meryle Ready [facility]  Sonographer Comments: Technically difficult study due to poor echo windows and patient is morbidly obese. Image acquisition challenging due to patient body habitus. see comments IMPRESSIONS  1. Extremely poor windows for TTE. Global LV function is normal.  2. Left ventricular ejection fraction, by estimation, is 60 to 65%. The left ventricle has normal function. Left ventricular endocardial border not optimally defined to evaluate regional wall motion. Left ventricular diastolic function could not be evaluated.  3. Right ventricular systolic function is normal. The right ventricular size is normal. Tricuspid regurgitation signal is inadequate for assessing PA pressure.  4. The mitral valve is grossly normal. No evidence of mitral valve regurgitation. No evidence of mitral stenosis.  5. The  aortic valve is tricuspid. Aortic valve regurgitation is not visualized. No aortic stenosis is present. FINDINGS  Left Ventricle: Left ventricular ejection fraction, by estimation, is 60 to 65%. The left ventricle has normal function. Left ventricular endocardial border not optimally defined to evaluate regional wall motion. Definity contrast agent was given IV to delineate the left ventricular endocardial borders. The left ventricular internal cavity size was normal in size. There is no left ventricular hypertrophy. Left ventricular diastolic function could not be evaluated due to atrial fibrillation. Left ventricular diastolic function could not be evaluated. Right Ventricle: The right ventricular size is normal. No increase in right ventricular wall thickness. Right ventricular systolic function is normal. Tricuspid regurgitation signal is inadequate for assessing PA pressure. Left Atrium: Left atrial size was not well visualized. Right Atrium: Right atrial size was not well visualized. Pericardium: Trivial pericardial effusion is present. Presence of pericardial fat pad. Mitral Valve: The mitral valve is grossly normal. No evidence of mitral valve regurgitation. No evidence of mitral valve stenosis. Tricuspid Valve: The tricuspid valve is grossly normal. Tricuspid valve regurgitation is not demonstrated. No evidence of tricuspid stenosis. Aortic Valve: The aortic valve is tricuspid. Aortic valve regurgitation is not visualized. No aortic stenosis is present. Pulmonic Valve: The pulmonic valve was grossly normal. Pulmonic valve regurgitation is not visualized. No evidence of pulmonic stenosis. Aorta: The aortic root is normal in size and structure. Venous: The inferior vena cava was not well visualized. IAS/Shunts: The interatrial septum was not well visualized.  LEFT VENTRICLE PLAX  2D LVOT diam:     2.30 cm LV SV:         80 LV SV Index:   30 LVOT Area:     4.15 cm  AORTIC VALVE LVOT Vmax:   105.00 cm/s LVOT  Vmean:  75.000 cm/s LVOT VTI:    0.192 m MITRAL VALVE MV Area (PHT): 7.16 cm     SHUNTS MV Decel Time: 106 msec     Systemic VTI:  0.19 m MV E velocity: 110.00 cm/s  Systemic Diam: 2.30 cm Lennie Odor MD Electronically signed by Lennie Odor MD Signature Date/Time: 08/03/2019/3:22:07 PM    Final         Scheduled Meds: . fluticasone furoate-vilanterol  1 puff Inhalation Daily  . metoprolol tartrate  25 mg Oral Q8H   Continuous Infusions: . diltiazem (CARDIZEM) infusion 15 mg/hr (08/03/19 1008)  . heparin 2,000 Units/hr (08/03/19 1313)     Time spent: 30 minutes with over 50% of the time coordinating the patient's care    Jae Dire, DO [facility] Pager [phone]  Call night coverage person covering after 7pm

## 2019-08-03 NOTE — Progress Notes (Signed)
  Echocardiogram 2D Echocardiogram has been performed.  Celene Skeen 08/03/2019, 12:23 PM

## 2019-08-03 NOTE — Progress Notes (Signed)
Consult request has been received. CSW attempting to follow up at present time  Anay Walter M. Jahdai Padovano LCSWA Transitions of Care  Clinical Social Worker  Ph: [phone] 

## 2019-08-03 NOTE — ED Notes (Signed)
Main phlebotomy made aware of heparin level drawn

## 2019-08-03 NOTE — ED Notes (Signed)
Pt changed over to hospital bed 

## 2019-08-04 LAB — CBC
HCT: 36.5 % (ref 36.0–46.0)
Hemoglobin: 11 g/dL — ABNORMAL LOW (ref 12.0–15.0)
MCH: 26 pg (ref 26.0–34.0)
MCHC: 30.1 g/dL (ref 30.0–36.0)
MCV: 86.3 fL (ref 80.0–100.0)
Platelets: 239 10*3/uL (ref 150–400)
RBC: 4.23 MIL/uL (ref 3.87–5.11)
RDW: 16 % — ABNORMAL HIGH (ref 11.5–15.5)
WBC: 8.5 10*3/uL (ref 4.0–10.5)
nRBC: 0 % (ref 0.0–0.2)

## 2019-08-04 LAB — HEPARIN LEVEL (UNFRACTIONATED)
Heparin Unfractionated: 0.29 IU/mL — ABNORMAL LOW (ref 0.30–0.70)
Heparin Unfractionated: 0.38 IU/mL (ref 0.30–0.70)
Heparin Unfractionated: 0.28 IU/mL — ABNORMAL LOW (ref 0.30–0.70)

## 2019-08-04 LAB — BASIC METABOLIC PANEL
Anion gap: 13 (ref 5–15)
BUN: 10 mg/dL (ref 6–20)
CO2: 20 mmol/L — ABNORMAL LOW (ref 22–32)
Calcium: 8.3 mg/dL — ABNORMAL LOW (ref 8.9–10.3)
Chloride: 102 mmol/L (ref 98–111)
Creatinine, Ser: 0.75 mg/dL (ref 0.44–1.00)
GFR calc Af Amer: >60 mL/min (ref >60)
GFR calc non Af Amer: >60 mL/min (ref >60)
Glucose, Bld: 124 mg/dL — ABNORMAL HIGH (ref 70–99)
Potassium: 3.7 mmol/L (ref 3.5–5.1)
Sodium: 135 mmol/L (ref 135–145)

## 2019-08-04 MED ORDER — APIXABAN 5 MG PO TABS
5.0000 mg | ORAL_TABLET | Freq: Two times a day (BID) | ORAL | Status: DC
  Administered 2019-08-04 – 2019-08-05 (×2): 5 mg via ORAL
  Filled 2019-08-04 (×2): qty 1

## 2019-08-04 MED ORDER — LIP MEDEX EX OINT: TOPICAL_OINTMENT | CUTANEOUS | Status: DC | PRN

## 2019-08-04 MED ORDER — MAGNESIUM SULFATE 2 GM/50ML IV SOLN
2.0000 g | Freq: Once | INTRAVENOUS | Status: AC
  Administered 2019-08-04: 2 g via INTRAVENOUS
  Filled 2019-08-04: qty 50

## 2019-08-04 MED ORDER — LIP MEDEX EX OINT
TOPICAL_OINTMENT | CUTANEOUS | Status: AC
  Administered 2019-08-04: 1 via OROMUCOSAL
  Filled 2019-08-04: qty 7

## 2019-08-04 MED ORDER — METOPROLOL TARTRATE 50 MG PO TABS
50.0000 mg | ORAL_TABLET | Freq: Two times a day (BID) | ORAL | Status: DC
  Administered 2019-08-04 – 2019-08-05 (×2): 50 mg via ORAL
  Filled 2019-08-04 (×2): qty 1

## 2019-08-04 MED ORDER — DILTIAZEM HCL-DEXTROSE 125-5 MG/125ML-% IV SOLN (PREMIX)
INTRAVENOUS | Status: AC
  Filled 2019-08-04: qty 125

## 2019-08-04 MED ORDER — POTASSIUM CHLORIDE CRYS ER 20 MEQ PO TBCR
40.0000 meq | EXTENDED_RELEASE_TABLET | Freq: Once | ORAL | Status: AC
  Administered 2019-08-04: 40 meq via ORAL
  Filled 2019-08-04: qty 2

## 2019-08-04 NOTE — Progress Notes (Addendum)
ANTICOAGULATION CONSULT NOTE  Pharmacy Consult for Heparin Indication: atrial fibrillation  Allergies  Allergen Reactions  . Gabapentin Anaphylaxis  . Topiramate Other (See Comments)    Jaw tightness and chest discomfort   . Imitrex [Sumatriptan] Other (See Comments)    Increased heart rate  . Montelukast Other (See Comments)    Nightmares   . Other     Pt states no narcotics ever!     Patient Measurements: most recent weight 176 kg in Feb 2021 Height: 5\' 5"  (165.1 cm) Weight: (!) 176 kg (388 lb 0.2 oz) IBW/kg (Calculated) : 57 Heparin Dosing Weight: 103.5 kg  Vital Signs: Temp: 99 F (37.2 C) (05/26 2137) Temp Source: Oral (05/26 2137) BP: 125/79 (05/26 2137) Pulse Rate: 100 (05/26 2137)  Labs: Recent Labs    08/02/19 1740 08/02/19 1928 08/02/19 2109 08/03/19 0134 08/03/19 0500 08/03/19 0915 08/03/19 2125  HGB 12.2  --   --   --  10.8*  --   --   HCT 39.6  --   --   --  35.4*  --   --   PLT 293  --   --   --  246  --   --   APTT  --   --   --  29  --   --   --   LABPROT  --   --   --  13.3  --   --   --   INR  --   --   --  1.1  --   --   --   HEPARINUNFRC  --   --   --   --   --  0.11* 0.31  CREATININE 0.93  --   --   --  0.99  --   --   TROPONINIHS 8 9 8   --   --   --   --     Estimated Creatinine Clearance: 114.8 mL/min (by C-G formula based on SCr of 0.99 mg/dL).   Medications:  Not on anticoagulation PTA  Assessment: 48 y/o F admitted with new-onset atrial fibrillation w/ rapid ventricular response to start anticoagulation with heparin infusion.    Baseline INR, aPTT: WNL  Prior anticoagulation: none  Significant events:  Today, 08/04/2019 12:15 AM:  CBC: Hgb slightly low after hydration, Plt stable WNL  Most recent heparin level therapeutic at 2000 units/hr  No bleeding or infusion issues per nursing   Goal of Therapy: Heparin level 0.3-0.7 units/ml Monitor platelets by anticoagulation protocol: Yes  Plan:  Continue heparin IV  infusion at 2000 units/hr  Daily CBC, daily heparin level   Monitor for signs of bleeding or thrombosis  52, PharmD, BCPS 08/04/2019, 12:14 AM

## 2019-08-04 NOTE — Progress Notes (Signed)
Transport from [employer] to [facility] for [employer] up with Gala Romney at [employer] for tomorrow 08/05/19 to be at [facility] at 1100. RN made aware and will have consent form signed and sent with pt. Weston Settle, RN

## 2019-08-04 NOTE — Progress Notes (Signed)
ANTICOAGULATION CONSULT NOTE - Follow Up Consult  Pharmacy Consult for heparin >> apixaban Indication: atrial fibrillation  Allergies  Allergen Reactions  . Gabapentin Anaphylaxis  . Topiramate Other (See Comments)    Jaw tightness and chest discomfort   . Imitrex [Sumatriptan] Other (See Comments)    Increased heart rate  . Montelukast Other (See Comments)    Nightmares   . Other     Pt states no narcotics ever!     Patient Measurements: Height: 5\' 5"  (165.1 cm) Weight: (!) 175.1 kg (386 lb) IBW/kg (Calculated) : 57 Heparin Dosing Weight: 103 kg  Vital Signs: Temp: 98.6 F (37 C) (05/27 1300) Temp Source: Oral (05/27 1300) BP: 128/105 (05/27 1300) Pulse Rate: 100 (05/27 1300)  Labs: Recent Labs    08/02/19 1740 08/02/19 1740 08/02/19 1928 08/02/19 2109 08/03/19 0134 08/03/19 0500 08/03/19 0915 08/03/19 2125 08/04/19 0410 08/04/19 1136  HGB 12.2   < >  --   --   --  10.8*  --   --  11.0*  --   HCT 39.6  --   --   --   --  35.4*  --   --  36.5  --   PLT 293  --   --   --   --  246  --   --  239  --   APTT  --   --   --   --  29  --   --   --   --   --   LABPROT  --   --   --   --  13.3  --   --   --   --   --   INR  --   --   --   --  1.1  --   --   --   --   --   HEPARINUNFRC  --   --   --   --   --   --    < > 0.31 0.29* 0.38  CREATININE 0.93  --   --   --   --  0.99  --   --  0.75  --   TROPONINIHS 8  --  9 8  --   --   --   --   --   --    < > = values in this interval not displayed.    Estimated Creatinine Clearance: 141.5 mL/min (by C-G formula based on SCr of 0.75 mg/dL).   Assessment: Patient's a 49 y.o F presented to the ED on 5/25 with c/o SOB and CP.  Chest CTA was negative for PE.  She was subsequently found to be in afib and is currently on heparin drip.  Today, 08/04/2019: - heparin level is therapeutic at 0.38 after rate increased to 2200 units/hr earlier this morning - cbc relatively stable - no bleeding documented - remains in afib -  cardiology team recom. TEE/DCCV on 5/28 if patient has not converted  PM update - Plan to convert patient to apixaban this evening   Goal of Therapy:  Heparin level 0.3-0.7 units/ml Monitor platelets by anticoagulation protocol: Yes   Plan:  - Stop heparin and start apixaban 5 mg PO BID  - Monitor renal function, CBC  - monitor for s/sx bleeding    6/28, PharmD, BCPS 08/04/2019 6:27 PM

## 2019-08-04 NOTE — Plan of Care (Signed)
  Problem: Education: Goal: Knowledge of General Education information will improve Description: Including pain rating scale, medication(s)/side effects and non-pharmacologic comfort measures Outcome: Progressing   Problem: Clinical Measurements: Goal: Diagnostic test results will improve Outcome: Progressing Goal: Respiratory complications will improve Outcome: Progressing   Problem: Elimination: Goal: Will not experience complications related to urinary retention Outcome: Progressing   

## 2019-08-04 NOTE — Progress Notes (Signed)
ANTICOAGULATION CONSULT NOTE  Pharmacy Consult for Heparin Indication: atrial fibrillation  Allergies  Allergen Reactions  . Gabapentin Anaphylaxis  . Topiramate Other (See Comments)    Jaw tightness and chest discomfort   . Imitrex [Sumatriptan] Other (See Comments)    Increased heart rate  . Montelukast Other (See Comments)    Nightmares   . Other     Pt states no narcotics ever!     Patient Measurements: most recent weight 176 kg in Feb 2021 Height: 5\' 5"  (165.1 cm) Weight: (!) 176 kg (388 lb 0.2 oz) IBW/kg (Calculated) : 57 Heparin Dosing Weight: 103.5 kg  Vital Signs: Temp: 98 F (36.7 C) (05/27 0134) Temp Source: Oral (05/27 0134) BP: 114/68 (05/27 0134) Pulse Rate: 56 (05/27 0134)  Labs: Recent Labs    08/02/19 1740 08/02/19 1740 08/02/19 1928 08/02/19 2109 08/03/19 0134 08/03/19 0500 08/03/19 0915 08/03/19 2125 08/04/19 0410  HGB 12.2   < >  --   --   --  10.8*  --   --  11.0*  HCT 39.6  --   --   --   --  35.4*  --   --  36.5  PLT 293  --   --   --   --  246  --   --  239  APTT  --   --   --   --  29  --   --   --   --   LABPROT  --   --   --   --  13.3  --   --   --   --   INR  --   --   --   --  1.1  --   --   --   --   HEPARINUNFRC  --   --   --   --   --   --  0.11* 0.31 0.29*  CREATININE 0.93  --   --   --   --  0.99  --   --  0.75  TROPONINIHS 8  --  9 8  --   --   --   --   --    < > = values in this interval not displayed.    Estimated Creatinine Clearance: 142 mL/min (by C-G formula based on SCr of 0.75 mg/dL).   Medications:  Not on anticoagulation PTA  Assessment: 49 y/o F admitted with new-onset atrial fibrillation w/ rapid ventricular response to start anticoagulation with heparin infusion.    Baseline INR, aPTT: WNL  Prior anticoagulation: none  Significant events:  Today, 08/04/2019 5:38 AM:  CBC: Hgb slightly low after hydration but stable, Plt stable WNL  Most recent heparin level sub-therapeutic at 2000  units/hr  No bleeding or infusion issues per nursing   Goal of Therapy: Heparin level 0.3-0.7 units/ml Monitor platelets by anticoagulation protocol: Yes  Plan:  Increase heparin infusion to 2200 units/hr  Recheck HL in 6 hours  Daily CBC, daily heparin level   Monitor for signs of bleeding or thrombosis  08/06/2019, PharmD, BCPS 08/04/2019, 5:38 AM

## 2019-08-04 NOTE — Progress Notes (Addendum)
PROGRESS NOTE    Alyssa Blanchard    Code Status: Full Code  XTK:[id number] DOB: [birth year] DOA: 08/02/2019 LOS: 1 days  PCP: No primary care provider on file. CC:  Chief Complaint  Patient presents with   [facility] Summary   This is a 49 year old female with history of asthma who was experiencing increased shortness of breath on exertion over the past 4 to 5 days with some lower extremity edema and chest tightness but no chest pain.  Went to her PCP on 5/25 who noted that she was tachycardic and was referred to the ED.Marland Kitchen  ED Course: In the ER patient was found to be in A. fib with RVR and was started on Cardizem infusion.  TSH was normal high sensitive troponin was negative.  BNP was 402.  CT angiogram of the chest shows features concerning for possible CHF with some effusion, Pneumonia cannot be ruled out but patient was afebrile and had no leukocytosis.  Patient was given 1 dose of Lasix admitted for further management of A. fib with RVR which is new onset.   5/26: Cardiology consulted  5/27: Patient still in A. fib with RVR, cardiology plan to transfer from [place] to [facility] for DCCV  A & P   Principal Problem:   Atrial fibrillation with RVR ([place]) Active Problems:   Hypoventilation associated with obesity syndrome (HCC)   History of asthma   Bipolar I disorder, most recent episode (or current) manic (HCC)   Paroxysmal atrial fibrillation with RVR Poorly controlled despite Cardizem drip at 15 mg/h Cardiology: Increase metoprolol to 25 mg twice daily.  heparin, continue diltiazem drip, transferred to [facility] for cardioversion TSH unremarkable Echo: Overall difficult visualization but with EF 60 to 65% otherwise unremarkable.  History of asthma not currently in exacerbation  Morbid obesity Risk factor for A. fib Outpatient follow-up Needs outpatient sleep study  Shortness of breath secondary to Volume overload, likely A. fib with RVR induced Plan status improved with Lasix and  echo as above Pneumonia has been ruled out  DVT prophylaxis: Heparin Family Communication: Patient updated at bedside Disposition Plan:  Status is: Inpatient  Remains inpatient appropriate because:IV treatments appropriate due to intensity of illness or inability to take PO   Dispo: The patient is from: Home              Anticipated d/c is to: Home              Anticipated d/c date is: 2 days              Patient currently is not medically stable to d/c.           Pressure injury documentation    None  Consultants  Cardiology   Procedures  None  Antibiotics   Anti-infectives (From admission, onward)    None         Subjective   States she did not sleep well last night due to being in the hospital but now medical issues.  Currently without many symptoms in regards to her atrial fibrillation.  Otherwise no other issues. Objective   Vitals:   08/04/19 0134 08/04/19 0613 08/04/19 0944 08/04/19 1300  BP: 114/68 123/78 107/69 (!) 128/105  Pulse: (!) 56 95 96 100  Resp: 18 20 20 14   Temp: 98 F (36.7 C) 99.1 F (37.3 C) 98.2 F (36.8 C) 98.6 F (37 C)  TempSrc: Oral Oral Oral Oral  SpO2: 93% 96%  97%   Weight:  (!) 175.1 kg    Height:        Intake/Output Summary (Last 24 hours) at 08/04/2019 1737 Last data filed at 08/04/2019 1505 Gross per 24 hour  Intake 2218 ml  Output 3300 ml  Net -1082 ml   Filed Weights   08/03/19 2137 08/04/19 0613  Weight: (!) 176 kg (!) 175.1 kg    Examination:  Physical Exam Vitals and nursing note reviewed.  Constitutional:      Appearance: Normal appearance.  HENT:     Head: Normocephalic and atraumatic.  Eyes:     Conjunctiva/sclera: Conjunctivae normal.  Cardiovascular:     Rate and Rhythm: Tachycardia present. Rhythm irregular.  Pulmonary:     Effort: Pulmonary effort is normal.     Breath sounds: Normal breath sounds.  Abdominal:     General: Abdomen is flat.     Palpations: Abdomen is soft.    Musculoskeletal:        General: No swelling or tenderness.  Skin:    Coloration: Skin is not jaundiced or pale.  Neurological:     Mental Status: She is alert. Mental status is at baseline.  Psychiatric:        Mood and Affect: Mood normal.        Behavior: Behavior normal.     Data Reviewed: I have personally reviewed following labs and imaging studies  CBC: Recent Labs  Lab 08/02/19 1740 08/03/19 0500 08/04/19 0410  WBC 10.3 9.7 8.5  NEUTROABS 7.8*  --   --   HGB 12.2 10.8* 11.0*  HCT 39.6 35.4* 36.5  MCV 85.0 86.3 86.3  PLT 293 246 239   Basic Metabolic Panel: Recent Labs  Lab 08/02/19 1740 08/02/19 1752 08/03/19 0500 08/03/19 0527 08/04/19 0410  NA 137  --  138  --  135  K 3.9  --  3.5  --  3.7  CL 106  --  105  --  102  CO2 21*  --  22  --  20*  GLUCOSE 114*  --  127*  --  124*  BUN 10  --  10  --  10  CREATININE 0.93  --  0.99  --  0.75  CALCIUM 8.6*  --  8.5*  --  8.3*  MG  --  2.0  --  1.8  --    GFR: Estimated Creatinine Clearance: 141.5 mL/min (by C-G formula based on SCr of 0.75 mg/dL). Liver Function Tests: No results for input(s): AST, ALT, ALKPHOS, BILITOT, PROT, ALBUMIN in the last 168 hours. No results for input(s): LIPASE, AMYLASE in the last 168 hours. No results for input(s): AMMONIA in the last 168 hours. Coagulation Profile: Recent Labs  Lab 08/03/19 0134  INR 1.1   Cardiac Enzymes: No results for input(s): CKTOTAL, CKMB, CKMBINDEX, TROPONINI in the last 168 hours. BNP (last 3 results) No results for input(s): PROBNP in the last 8760 hours. HbA1C: No results for input(s): HGBA1C in the last 72 hours. CBG: No results for input(s): GLUCAP in the last 168 hours. Lipid Profile: No results for input(s): CHOL, HDL, LDLCALC, TRIG, CHOLHDL, LDLDIRECT in the last 72 hours. Thyroid Function Tests: Recent Labs    08/02/19 1740  TSH 2.735   Anemia Panel: No results for input(s): VITAMINB12, FOLATE, FERRITIN, TIBC, IRON, RETICCTPCT  in the last 72 hours. Sepsis Labs: Recent Labs  Lab 08/03/19 0527  PROCALCITON <0.10    Recent Results (from the past 240 hour(s))  SARS Coronavirus 2 by RT PCR (hospital order, performed [facility]) Nasopharyngeal Nasopharyngeal Swab     Status: None   Collection Time: 08/02/19  8:31 PM   Specimen: Nasopharyngeal Swab  Result Value Ref Range Status   SARS Coronavirus 2 NEGATIVE NEGATIVE Final    Comment: (NOTE) SARS-CoV-2 target nucleic acids are NOT DETECTED. The SARS-CoV-2 RNA is generally detectable in upper and lower respiratory specimens during the acute phase of infection. The lowest concentration of SARS-CoV-2 viral copies this assay can detect is 250 copies / mL. A negative result does not preclude SARS-CoV-2 infection and should not be used as the sole basis for treatment or other patient management decisions.  A negative result may occur with improper specimen collection / handling, submission of specimen other than nasopharyngeal swab, presence of viral mutation(s) within the areas targeted by this assay, and inadequate number of viral copies (<250 copies / mL). A negative result must be combined with clinical observations, patient history, and epidemiological information. Fact Sheet for Patients:   BoilerBrush.com.cyhttps://www.fda.gov/media/136312/download Fact Sheet for Healthcare Providers: https://pope.com/https://www.fda.gov/media/136313/download This test is not yet approved or cleared  by the [place] FDA and has been authorized for detection and/or diagnosis of SARS-CoV-2 by FDA under an Emergency Use Authorization (EUA).  This EUA will remain in effect (meaning this test can be used) for the duration of the COVID-19 declaration under Section 564(b)(1) of the Act, 21 U.S.C. section 360bbb-3(b)(1), unless the authorization is terminated or revoked sooner. Performed at [facility], [address] [place], [place] [address]          Radiology  Studies: CT Angio Chest PE W/Cm &/Or Wo Cm  Result Date: 08/02/2019 CLINICAL DATA:  Asthma exacerbation.  Shortness of breath. EXAM: CT ANGIOGRAPHY CHEST WITH CONTRAST TECHNIQUE: Multidetector CT imaging of the chest was performed using the standard protocol during bolus administration of intravenous contrast. Multiplanar CT image reconstructions and MIPs were obtained to evaluate the vascular anatomy. CONTRAST:  100mL OMNIPAQUE IOHEXOL 350 MG/ML SOLN COMPARISON:  None. FINDINGS: Cardiovascular: Borderline heart size for age. No pericardial effusion. The aorta is normal in caliber. No dissection. No atherosclerotic calcifications. No definite coronary artery calcifications. The pulmonary arterial tree is fairly well opacified but study limited by significant breathing motion artifact. No large central pulmonary emboli are identified. Mediastinum/Nodes: Borderline enlarged mediastinal and hilar lymph nodes could be inflammatory/reactive secondary to the lung findings. The esophagus is grossly normal. The thyroid gland is grossly normal. Lungs/Pleura: Examination limited by breathing motion artifact but there is a diffuse hazy interstitial and airspace process in the lungs with a somewhat perihilar predominant pattern which could suggest pulmonary edema or interstitial pneumonia. Very small pleural effusions. Upper Abdomen: Severe and diffuse fatty infiltration of the liver. No hepatic lesions. The gallbladder is grossly normal. Surgical changes involving the stomach likely from prior gastric stapling procedure. No complicating features. Musculoskeletal: No breast masses, supraclavicular or axillary adenopathy. The bony thorax is intact. Review of the MIP images confirms the above findings. IMPRESSION: 1. Limited examination due to breathing motion artifact. 2. No large central pulmonary emboli are identified. 3. Normal thoracic aorta. 4. Diffuse hazy interstitial and airspace process in the lungs with a somewhat  perihilar predominant pattern which could suggest pulmonary edema or interstitial pneumonia. 5. Very small bilateral pleural effusions. 6. Severe and diffuse fatty infiltration of the liver. Electronically Signed   By: Rudie MeyerP.  Gallerani M.D.   On: 08/02/2019 20:02   ECHOCARDIOGRAM COMPLETE  Result Date: 08/03/2019    ECHOCARDIOGRAM REPORT   Patient Name:   Alyssa Blanchard Date of Exam: 08/03/2019 Medical Rec #:  [id number]   Height:       66.0 in Accession #:    [id number]  Weight:       389.0 lb Date of Birth:  1970/07/13   BSA:          2.653 m Patient Age:    48 years    BP:           99/65 mmHg Patient Gender: F           HR:           117 bpm. Exam Location:  Inpatient Procedure: 2D Echo Indications:    atrial fibrillation 427.31  History:        Patient has no prior history of Echocardiogram examinations.                 Risk Factors:Former Smoker.  Sonographer:    Celene Skeen RDCS (AE) Referring Phys: [id number] Meryle Ready [employer] [facility]  Sonographer Comments: Technically difficult study due to poor echo windows and patient is morbidly obese. Image acquisition challenging due to patient body habitus. see comments IMPRESSIONS  1. Extremely poor windows for TTE. Global LV function is normal.  2. Left ventricular ejection fraction, by estimation, is 60 to 65%. The left ventricle has normal function. Left ventricular endocardial border not optimally defined to evaluate regional wall motion. Left ventricular diastolic function could not be evaluated.  3. Right ventricular systolic function is normal. The right ventricular size is normal. Tricuspid regurgitation signal is inadequate for assessing PA pressure.  4. The mitral valve is grossly normal. No evidence of mitral valve regurgitation. No evidence of mitral stenosis.  5. The aortic valve is tricuspid. Aortic valve regurgitation is not visualized. No aortic stenosis is present. FINDINGS  Left Ventricle: Left ventricular ejection fraction, by estimation, is 60 to 65%. The left  ventricle has normal function. Left ventricular endocardial border not optimally defined to evaluate regional wall motion. Definity contrast agent was given IV to delineate the left ventricular endocardial borders. The left ventricular internal cavity size was normal in size. There is no left ventricular hypertrophy. Left ventricular diastolic function could not be evaluated due to atrial fibrillation. Left ventricular diastolic function could not be evaluated. Right Ventricle: The right ventricular size is normal. No increase in right ventricular wall thickness. Right ventricular systolic function is normal. Tricuspid regurgitation signal is inadequate for assessing PA pressure. Left Atrium: Left atrial size was not well visualized. Right Atrium: Right atrial size was not well visualized. Pericardium: Trivial pericardial effusion is present. Presence of pericardial fat pad. Mitral Valve: The mitral valve is grossly normal. No evidence of mitral valve regurgitation. No evidence of mitral valve stenosis. Tricuspid Valve: The tricuspid valve is grossly normal. Tricuspid valve regurgitation is not demonstrated. No evidence of tricuspid stenosis. Aortic Valve: The aortic valve is tricuspid. Aortic valve regurgitation is not visualized. No aortic stenosis is present. Pulmonic Valve: The pulmonic valve was grossly normal. Pulmonic valve regurgitation is not visualized. No evidence of pulmonic stenosis. Aorta: The aortic root is normal in size and structure. Venous: The inferior vena cava was not well visualized. IAS/Shunts: The interatrial septum was not well visualized.  LEFT VENTRICLE PLAX 2D LVOT diam:     2.30 cm LV SV:         80 LV SV Index:   30 LVOT Area:  4.15 cm  AORTIC VALVE LVOT Vmax:   105.00 cm/s LVOT Vmean:  75.000 cm/s LVOT VTI:    0.192 m MITRAL VALVE MV Area (PHT): 7.16 cm     SHUNTS MV Decel Time: 106 msec     Systemic VTI:  0.19 m MV E velocity: 110.00 cm/s  Systemic Diam: 2.30 cm Lennie Odor MD  Electronically signed by Lennie Odor MD Signature Date/Time: 08/03/2019/3:22:07 PM    Final         Scheduled Meds:  fluticasone furoate-vilanterol  1 puff Inhalation Daily   metoprolol tartrate  25 mg Oral Q8H   Continuous Infusions:  diltiazem (CARDIZEM) infusion 15 mg/hr (08/04/19 1245)   heparin 2,200 Units/hr (08/04/19 1502)     Time spent: 25 minutes with over 50% of the time coordinating the patient's care    Jae Dire, DO [facility] Pager [phone]  Call night coverage person covering after 7pm

## 2019-08-04 NOTE — Progress Notes (Signed)
ANTICOAGULATION CONSULT NOTE - Follow Up Consult  Pharmacy Consult for heparin Indication: atrial fibrillation  Allergies  Allergen Reactions  . Gabapentin Anaphylaxis  . Topiramate Other (See Comments)    Jaw tightness and chest discomfort   . Imitrex [Sumatriptan] Other (See Comments)    Increased heart rate  . Montelukast Other (See Comments)    Nightmares   . Other     Pt states no narcotics ever!     Patient Measurements: Height: 5\' 5"  (165.1 cm) Weight: (!) 175.1 kg (386 lb) IBW/kg (Calculated) : 57 Heparin Dosing Weight: 103 kg  Vital Signs: Temp: 98.2 F (36.8 C) (05/27 0944) Temp Source: Oral (05/27 0944) BP: 107/69 (05/27 0944) Pulse Rate: 96 (05/27 0944)  Labs: Recent Labs    08/02/19 1740 08/02/19 1740 08/02/19 1928 08/02/19 2109 08/03/19 0134 08/03/19 0500 08/03/19 0915 08/03/19 2125 08/04/19 0410  HGB 12.2   < >  --   --   --  10.8*  --   --  11.0*  HCT 39.6  --   --   --   --  35.4*  --   --  36.5  PLT 293  --   --   --   --  246  --   --  239  APTT  --   --   --   --  29  --   --   --   --   LABPROT  --   --   --   --  13.3  --   --   --   --   INR  --   --   --   --  1.1  --   --   --   --   HEPARINUNFRC  --   --   --   --   --   --  0.11* 0.31 0.29*  CREATININE 0.93  --   --   --   --  0.99  --   --  0.75  TROPONINIHS 8  --  9 8  --   --   --   --   --    < > = values in this interval not displayed.    Estimated Creatinine Clearance: 141.5 mL/min (by C-G formula based on SCr of 0.75 mg/dL).   Assessment: Patient's a 49 y.o F presented to the ED on 5/25 with c/o SOB and CP.  Chest CTA was negative for PE.  She was subsequently found to be in afib and is currently on heparin drip.  Today, 08/04/2019: - heparin level is therapeutic at 0.38 after rate increased to 2200 units/hr earlier this morning - cbc relatively stable - no bleeding documented - remains in afib - cardiology team recom. TEE/DCCV on 5/28 if patient has not  converted  Goal of Therapy:  Heparin level 0.3-0.7 units/ml Monitor platelets by anticoagulation protocol: Yes   Plan:  - continue heparin drip at 2200 units/hr - repeat another heparin level at 6PM to ensure level is still therapeutic before changing to daily monitoring - monitor for s/sx bleeding  Seena Face P 08/04/2019,9:53 AM

## 2019-08-04 NOTE — Progress Notes (Signed)
 Progress Note  Patient Name: Shelise Berni Date of Encounter: 08/04/2019  Primary Cardiologist: Tiffany [place], [place]   Subjective   Patient remains in afib with rates 100-120. Pressures stable. She still feels sob on exertion. No chest pain.   Inpatient Medications    Scheduled Meds: . fluticasone furoate-vilanterol  1 puff Inhalation Daily  . metoprolol tartrate  25 mg Oral Q8H   Continuous Infusions: . diltiazem (CARDIZEM) infusion 15 mg/hr (08/04/19 0358)  . heparin 2,200 Units/hr (08/04/19 0545)   PRN Meds: levalbuterol, lip balm, ondansetron **OR** ondansetron (ZOFRAN) IV   Vital Signs    Vitals:   08/03/19 2000 08/03/19 2137 08/04/19 0134 08/04/19 0613  BP: 108/79 125/79 114/68 123/78  Pulse: 99 100 (!) 56 95  Resp: 20 20 18 20  Temp:  99 F (37.2 C) 98 F (36.7 C) 99.1 F (37.3 C)  TempSrc:  Oral Oral Oral  SpO2: 98% 98% 93% 96%  Weight:  (!) 176 kg  (!) 175.1 kg  Height:  5' 5" (1.651 m)      Intake/Output Summary (Last 24 hours) at 08/04/2019 0829 Last data filed at 08/04/2019 0700 Gross per 24 hour  Intake 1551.84 ml  Output 4250 ml  Net -2698.16 ml   Last 3 Weights 08/04/2019 08/03/2019 04/13/2019  Weight (lbs) 386 lb 388 lb 0.2 oz 364 lb  Weight (kg) 175.088 kg 176 kg 165.109 kg  Some encounter information is confidential and restricted. Go to Review Flowsheets activity to see all data.      Telemetry    Afib HR 100-120 - Personally Reviewed  ECG    No new - Personally Reviewed  Physical Exam   GEN: No acute distress.   Neck: No JVD Cardiac: Irreg Irreg, tachycardic, no murmurs, rubs, or gallops.  Respiratory: Clear to auscultation bilaterally. GI: Soft, nontender, non-distended  MS: No edema; No deformity. Neuro:  Nonfocal  Psych: Normal affect   Labs    High Sensitivity Troponin:   Recent Labs  Lab 08/02/19 1740 08/02/19 1928 08/02/19 2109  TROPONINIHS 8 9 8      Chemistry Recent Labs  Lab 08/02/19 1740 08/03/19 0500  08/04/19 0410  NA 137 138 135  K 3.9 3.5 3.7  CL 106 105 102  CO2 21* 22 20*  GLUCOSE 114* 127* 124*  BUN 10 10 10  CREATININE 0.93 0.99 0.75  CALCIUM 8.6* 8.5* 8.3*  GFRNONAA >60 >60 >60  GFRAA >60 >60 >60  ANIONGAP 10 11 13     Hematology Recent Labs  Lab 08/02/19 1740 08/03/19 0500 08/04/19 0410  WBC 10.3 9.7 8.5  RBC 4.66 4.10 4.23  HGB 12.2 10.8* 11.0*  HCT 39.6 35.4* 36.5  MCV 85.0 86.3 86.3  MCH 26.2 26.3 26.0  MCHC 30.8 30.5 30.1  RDW 15.9* 16.0* 16.0*  PLT 293 246 239    BNP Recent Labs  Lab 08/02/19 1854  BNP 402.6*     DDimer  Recent Labs  Lab 08/02/19 1740  DDIMER 1.11*     Radiology    DG Chest 2 View  Result Date: 08/02/2019 CLINICAL DATA:  Shortness of breath and cough for 1 week. EXAM: CHEST - 2 VIEW COMPARISON:  03/17/2019 FINDINGS: The cardiac silhouette is normal in size and configuration. No mediastinal or hilar masses. No evidence of adenopathy. Clear lungs.  No pleural effusion or pneumothorax. Skeletal structures are intact. IMPRESSION: No active cardiopulmonary disease. Electronically Signed   By: David  Ormond M.D.   On: 08/02/2019   16:15   CT Angio Chest PE W/Cm &/Or Wo Cm  Result Date: 08/02/2019 CLINICAL DATA:  Asthma exacerbation.  Shortness of breath. EXAM: CT ANGIOGRAPHY CHEST WITH CONTRAST TECHNIQUE: Multidetector CT imaging of the chest was performed using the standard protocol during bolus administration of intravenous contrast. Multiplanar CT image reconstructions and MIPs were obtained to evaluate the vascular anatomy. CONTRAST:  OMNIPAQUE IOHEXOL 350 MG/ML SOLN COMPARISON:  None. FINDINGS: Cardiovascular: Borderline heart size for age. No pericardial effusion. The aorta is normal in caliber. No dissection. No atherosclerotic calcifications. No definite coronary artery calcifications. The pulmonary arterial tree is fairly well opacified but study limited by significant breathing motion artifact. No large central pulmonary  emboli are identified. Mediastinum/Nodes: Borderline enlarged mediastinal and hilar lymph nodes could be inflammatory/reactive secondary to the lung findings. The esophagus is grossly normal. The thyroid gland is grossly normal. Lungs/Pleura: Examination limited by breathing motion artifact but there is a diffuse hazy interstitial and airspace process in the lungs with a somewhat perihilar predominant pattern which could suggest pulmonary edema or interstitial pneumonia. Very small pleural effusions. Upper Abdomen: Severe and diffuse fatty infiltration of the liver. No hepatic lesions. The gallbladder is grossly normal. Surgical changes involving the stomach likely from prior gastric stapling procedure. No complicating features. Musculoskeletal: No breast masses, supraclavicular or axillary adenopathy. The bony thorax is intact. Review of the MIP images confirms the above findings. IMPRESSION: 1. Limited examination due to breathing motion artifact. 2. No large central pulmonary emboli are identified. 3. Normal thoracic aorta. 4. Diffuse hazy interstitial and airspace process in the lungs with a somewhat perihilar predominant pattern which could suggest pulmonary edema or interstitial pneumonia. 5. Very small bilateral pleural effusions. 6. Severe and diffuse fatty infiltration of the liver. Electronically Signed   By: Rudie Meyer M.D.   On: 08/02/2019 20:02   ECHOCARDIOGRAM COMPLETE  Result Date: 08/03/2019    ECHOCARDIOGRAM REPORT   Patient Name:   Shemeca Florentino Date of Exam: 08/03/2019 Medical Rec #:  [id number]   Height:       66.0 in Accession #:    [id number]  Weight:       389.0 lb Date of Birth:  [birth year]   BSA:          2.653 m Patient Age:    49 years    BP:           99/65 mmHg Patient Gender: F           HR:           117 bpm. Exam Location:  Inpatient Procedure: 2D Echo Indications:    atrial fibrillation 427.31  History:        Patient has no prior history of Echocardiogram examinations.                  Risk Factors:Former Smoker.  Sonographer:    Celene Skeen RDCS (AE) Referring Phys: [id number] Meryle Ready [facility]  Sonographer Comments: Technically difficult study due to poor echo windows and patient is morbidly obese. Image acquisition challenging due to patient body habitus. see comments IMPRESSIONS  1. Extremely poor windows for TTE. Global LV function is normal.  2. Left ventricular ejection fraction, by estimation, is 60 to 65%. The left ventricle has normal function. Left ventricular endocardial border not optimally defined to evaluate regional wall motion. Left ventricular diastolic function could not be evaluated.  3. Right ventricular systolic function is normal. The right ventricular size is normal. Tricuspid regurgitation  signal is inadequate for assessing PA pressure.  4. The mitral valve is grossly normal. No evidence of mitral valve regurgitation. No evidence of mitral stenosis.  5. The aortic valve is tricuspid. Aortic valve regurgitation is not visualized. No aortic stenosis is present. FINDINGS  Left Ventricle: Left ventricular ejection fraction, by estimation, is 60 to 65%. The left ventricle has normal function. Left ventricular endocardial border not optimally defined to evaluate regional wall motion. Definity contrast agent was given IV to delineate the left ventricular endocardial borders. The left ventricular internal cavity size was normal in size. There is no left ventricular hypertrophy. Left ventricular diastolic function could not be evaluated due to atrial fibrillation. Left ventricular diastolic function could not be evaluated. Right Ventricle: The right ventricular size is normal. No increase in right ventricular wall thickness. Right ventricular systolic function is normal. Tricuspid regurgitation signal is inadequate for assessing PA pressure. Left Atrium: Left atrial size was not well visualized. Right Atrium: Right atrial size was not well visualized. Pericardium: Trivial  pericardial effusion is present. Presence of pericardial fat pad. Mitral Valve: The mitral valve is grossly normal. No evidence of mitral valve regurgitation. No evidence of mitral valve stenosis. Tricuspid Valve: The tricuspid valve is grossly normal. Tricuspid valve regurgitation is not demonstrated. No evidence of tricuspid stenosis. Aortic Valve: The aortic valve is tricuspid. Aortic valve regurgitation is not visualized. No aortic stenosis is present. Pulmonic Valve: The pulmonic valve was grossly normal. Pulmonic valve regurgitation is not visualized. No evidence of pulmonic stenosis. Aorta: The aortic root is normal in size and structure. Venous: The inferior vena cava was not well visualized. IAS/Shunts: The interatrial septum was not well visualized.  LEFT VENTRICLE PLAX 2D LVOT diam:     2.30 cm LV SV:         80 LV SV Index:   30 LVOT Area:     4.15 cm  AORTIC VALVE LVOT Vmax:   105.00 cm/s LVOT Vmean:  75.000 cm/s LVOT VTI:    0.192 m MITRAL VALVE MV Area (PHT): 7.16 cm     SHUNTS MV Decel Time: 106 msec     Systemic VTI:  0.19 m MV E velocity: 110.00 cm/s  Systemic Diam: 2.30 cm Eleonore Chiquito MD Electronically signed by Eleonore Chiquito MD Signature Date/Time: 08/03/2019/3:22:07 PM    Final     Cardiac Studies   Echo 08/03/19 1. Extremely poor windows for TTE. Global LV function is normal.  2. Left ventricular ejection fraction, by estimation, is 60 to 65%. The  left ventricle has normal function. Left ventricular endocardial border  not optimally defined to evaluate regional wall motion. Left ventricular  diastolic function could not be  evaluated.  3. Right ventricular systolic function is normal. The right ventricular  size is normal. Tricuspid regurgitation signal is inadequate for assessing  PA pressure.  4. The mitral valve is grossly normal. No evidence of mitral valve  regurgitation. No evidence of mitral stenosis.  5. The aortic valve is tricuspid. Aortic valve regurgitation  is not  visualized. No aortic stenosis is present.    Patient Profile     49 y.o. female with a hx of morbid obesity s/p gastric bypass in 2000, GERD, bipolar disorder and obesity hypoventilation who is being seen today for the evaluation of new onset atrialfibrillation  Assessment & Plan    Paroxysmal Afib - started on IV dilt, now at 15 mg/h - Metoprolol 12.5mg  Q8 hours was added. Pressures stable - Echo showed LVEF  60-65%, normal RV function - IV heparin - CHADSVASC at least 1 (female, ?CHF) - Goal Mag>2 and K+>4 - Still in afib with elevated rates 100-120. Can add amiodarone to see if she will convert. Would plan for TEE/DCCV tomorrow as she has not converted on her own.   Obesity hypoventilation - CPAP/Bipap per IM  For questions or updates, please contact [facility] Please consult [email or web address] for contact info under        Signed, Soyla Bainter David Stall, PA-C  08/04/2019, 8:29 AM

## 2019-08-04 NOTE — Discharge Instructions (Signed)

## 2019-08-04 NOTE — H&P (View-Only) (Signed)
Progress Note  Patient Name: Alyssa Blanchard Date of Encounter: 08/04/2019  Primary Cardiologist: Chilton Si, MD   Subjective   Patient remains in afib with rates 100-120. Pressures stable. She still feels sob on exertion. No chest pain.   Inpatient Medications    Scheduled Meds: . fluticasone furoate-vilanterol  1 puff Inhalation Daily  . metoprolol tartrate  25 mg Oral Q8H   Continuous Infusions: . diltiazem (CARDIZEM) infusion 15 mg/hr (08/04/19 0358)  . heparin 2,200 Units/hr (08/04/19 0545)   PRN Meds: levalbuterol, lip balm, ondansetron **OR** ondansetron (ZOFRAN) IV   Vital Signs    Vitals:   08/03/19 2000 08/03/19 2137 08/04/19 0134 08/04/19 0613  BP: 108/79 125/79 114/68 123/78  Pulse: 99 100 (!) 56 95  Resp: 20 20 18 20   Temp:  99 F (37.2 C) 98 F (36.7 C) 99.1 F (37.3 C)  TempSrc:  Oral Oral Oral  SpO2: 98% 98% 93% 96%  Weight:  (!) 176 kg  (!) 175.1 kg  Height:  5\' 5"  (1.651 m)      Intake/Output Summary (Last 24 hours) at 08/04/2019 0829 Last data filed at 08/04/2019 0700 Gross per 24 hour  Intake 1551.84 ml  Output 4250 ml  Net -2698.16 ml   Last 3 Weights 08/04/2019 08/03/2019 04/13/2019  Weight (lbs) 386 lb 388 lb 0.2 oz 364 lb  Weight (kg) 175.088 kg 176 kg 165.109 kg  Some encounter information is confidential and restricted. Go to Review Flowsheets activity to see all data.      Telemetry    Afib HR 100-120 - Personally Reviewed  ECG    No new - Personally Reviewed  Physical Exam   GEN: No acute distress.   Neck: No JVD Cardiac: Irreg Irreg, tachycardic, no murmurs, rubs, or gallops.  Respiratory: Clear to auscultation bilaterally. GI: Soft, nontender, non-distended  MS: No edema; No deformity. Neuro:  Nonfocal  Psych: Normal affect   Labs    High Sensitivity Troponin:   Recent Labs  Lab 08/02/19 1740 08/02/19 1928 08/02/19 2109  TROPONINIHS 8 9 8       Chemistry Recent Labs  Lab 08/02/19 1740 08/03/19 0500  08/04/19 0410  NA 137 138 135  K 3.9 3.5 3.7  CL 106 105 102  CO2 21* 22 20*  GLUCOSE 114* 127* 124*  BUN 10 10 10   CREATININE 0.93 0.99 0.75  CALCIUM 8.6* 8.5* 8.3*  GFRNONAA >60 >60 >60  GFRAA >60 >60 >60  ANIONGAP 10 11 13      Hematology Recent Labs  Lab 08/02/19 1740 08/03/19 0500 08/04/19 0410  WBC 10.3 9.7 8.5  RBC 4.66 4.10 4.23  HGB 12.2 10.8* 11.0*  HCT 39.6 35.4* 36.5  MCV 85.0 86.3 86.3  MCH 26.2 26.3 26.0  MCHC 30.8 30.5 30.1  RDW 15.9* 16.0* 16.0*  PLT 293 246 239    BNP Recent Labs  Lab 08/02/19 1854  BNP 402.6*     DDimer  Recent Labs  Lab 08/02/19 1740  DDIMER 1.11*     Radiology    DG Chest 2 View  Result Date: 08/02/2019 CLINICAL DATA:  Shortness of breath and cough for 1 week. EXAM: CHEST - 2 VIEW COMPARISON:  03/17/2019 FINDINGS: The cardiac silhouette is normal in size and configuration. No mediastinal or hilar masses. No evidence of adenopathy. Clear lungs.  No pleural effusion or pneumothorax. Skeletal structures are intact. IMPRESSION: No active cardiopulmonary disease. Electronically Signed   By: 08/06/19 M.D.   On: 08/02/2019  16:15   CT Angio Chest PE W/Cm &/Or Wo Cm  Result Date: 08/02/2019 CLINICAL DATA:  Asthma exacerbation.  Shortness of breath. EXAM: CT ANGIOGRAPHY CHEST WITH CONTRAST TECHNIQUE: Multidetector CT imaging of the chest was performed using the standard protocol during bolus administration of intravenous contrast. Multiplanar CT image reconstructions and MIPs were obtained to evaluate the vascular anatomy. CONTRAST:  OMNIPAQUE IOHEXOL 350 MG/ML SOLN COMPARISON:  None. FINDINGS: Cardiovascular: Borderline heart size for age. No pericardial effusion. The aorta is normal in caliber. No dissection. No atherosclerotic calcifications. No definite coronary artery calcifications. The pulmonary arterial tree is fairly well opacified but study limited by significant breathing motion artifact. No large central pulmonary  emboli are identified. Mediastinum/Nodes: Borderline enlarged mediastinal and hilar lymph nodes could be inflammatory/reactive secondary to the lung findings. The esophagus is grossly normal. The thyroid gland is grossly normal. Lungs/Pleura: Examination limited by breathing motion artifact but there is a diffuse hazy interstitial and airspace process in the lungs with a somewhat perihilar predominant pattern which could suggest pulmonary edema or interstitial pneumonia. Very small pleural effusions. Upper Abdomen: Severe and diffuse fatty infiltration of the liver. No hepatic lesions. The gallbladder is grossly normal. Surgical changes involving the stomach likely from prior gastric stapling procedure. No complicating features. Musculoskeletal: No breast masses, supraclavicular or axillary adenopathy. The bony thorax is intact. Review of the MIP images confirms the above findings. IMPRESSION: 1. Limited examination due to breathing motion artifact. 2. No large central pulmonary emboli are identified. 3. Normal thoracic aorta. 4. Diffuse hazy interstitial and airspace process in the lungs with a somewhat perihilar predominant pattern which could suggest pulmonary edema or interstitial pneumonia. 5. Very small bilateral pleural effusions. 6. Severe and diffuse fatty infiltration of the liver. Electronically Signed   By: Rudie Meyer M.D.   On: 08/02/2019 20:02   ECHOCARDIOGRAM COMPLETE  Result Date: 08/03/2019    ECHOCARDIOGRAM REPORT   Patient Name:   Alyssa Blanchard Date of Exam: 08/03/2019 Medical Rec #:  [id number]   Height:       66.0 in Accession #:    [id number]  Weight:       389.0 lb Date of Birth:  [birth year]   BSA:          2.653 m Patient Age:    48 years    BP:           99/65 mmHg Patient Gender: F           HR:           117 bpm. Exam Location:  Inpatient Procedure: 2D Echo Indications:    atrial fibrillation 427.31  History:        Patient has no prior history of Echocardiogram examinations.                  Risk Factors:Former Smoker.  Sonographer:    Celene Skeen RDCS (AE) Referring Phys: [id number] Meryle Ready [facility]  Sonographer Comments: Technically difficult study due to poor echo windows and patient is morbidly obese. Image acquisition challenging due to patient body habitus. see comments IMPRESSIONS  1. Extremely poor windows for TTE. Global LV function is normal.  2. Left ventricular ejection fraction, by estimation, is 60 to 65%. The left ventricle has normal function. Left ventricular endocardial border not optimally defined to evaluate regional wall motion. Left ventricular diastolic function could not be evaluated.  3. Right ventricular systolic function is normal. The right ventricular size is normal. Tricuspid regurgitation  signal is inadequate for assessing PA pressure.  4. The mitral valve is grossly normal. No evidence of mitral valve regurgitation. No evidence of mitral stenosis.  5. The aortic valve is tricuspid. Aortic valve regurgitation is not visualized. No aortic stenosis is present. FINDINGS  Left Ventricle: Left ventricular ejection fraction, by estimation, is 60 to 65%. The left ventricle has normal function. Left ventricular endocardial border not optimally defined to evaluate regional wall motion. Definity contrast agent was given IV to delineate the left ventricular endocardial borders. The left ventricular internal cavity size was normal in size. There is no left ventricular hypertrophy. Left ventricular diastolic function could not be evaluated due to atrial fibrillation. Left ventricular diastolic function could not be evaluated. Right Ventricle: The right ventricular size is normal. No increase in right ventricular wall thickness. Right ventricular systolic function is normal. Tricuspid regurgitation signal is inadequate for assessing PA pressure. Left Atrium: Left atrial size was not well visualized. Right Atrium: Right atrial size was not well visualized. Pericardium: Trivial  pericardial effusion is present. Presence of pericardial fat pad. Mitral Valve: The mitral valve is grossly normal. No evidence of mitral valve regurgitation. No evidence of mitral valve stenosis. Tricuspid Valve: The tricuspid valve is grossly normal. Tricuspid valve regurgitation is not demonstrated. No evidence of tricuspid stenosis. Aortic Valve: The aortic valve is tricuspid. Aortic valve regurgitation is not visualized. No aortic stenosis is present. Pulmonic Valve: The pulmonic valve was grossly normal. Pulmonic valve regurgitation is not visualized. No evidence of pulmonic stenosis. Aorta: The aortic root is normal in size and structure. Venous: The inferior vena cava was not well visualized. IAS/Shunts: The interatrial septum was not well visualized.  LEFT VENTRICLE PLAX 2D LVOT diam:     2.30 cm LV SV:         80 LV SV Index:   30 LVOT Area:     4.15 cm  AORTIC VALVE LVOT Vmax:   105.00 cm/s LVOT Vmean:  75.000 cm/s LVOT VTI:    0.192 m MITRAL VALVE MV Area (PHT): 7.16 cm     SHUNTS MV Decel Time: 106 msec     Systemic VTI:  0.19 m MV E velocity: 110.00 cm/s  Systemic Diam: 2.30 cm Eleonore Chiquito MD Electronically signed by Eleonore Chiquito MD Signature Date/Time: 08/03/2019/3:22:07 PM    Final     Cardiac Studies   Echo 08/03/19 1. Extremely poor windows for TTE. Global LV function is normal.  2. Left ventricular ejection fraction, by estimation, is 60 to 65%. The  left ventricle has normal function. Left ventricular endocardial border  not optimally defined to evaluate regional wall motion. Left ventricular  diastolic function could not be  evaluated.  3. Right ventricular systolic function is normal. The right ventricular  size is normal. Tricuspid regurgitation signal is inadequate for assessing  PA pressure.  4. The mitral valve is grossly normal. No evidence of mitral valve  regurgitation. No evidence of mitral stenosis.  5. The aortic valve is tricuspid. Aortic valve regurgitation  is not  visualized. No aortic stenosis is present.    Patient Profile     49 y.o. female with a hx of morbid obesity s/p gastric bypass in 2000, GERD, bipolar disorder and obesity hypoventilation who is being seen today for the evaluation of new onset atrialfibrillation  Assessment & Plan    Paroxysmal Afib - started on IV dilt, now at 15 mg/h - Metoprolol 12.5mg  Q8 hours was added. Pressures stable - Echo showed LVEF  60-65%, normal RV function - IV heparin - CHADSVASC at least 1 (female, ?CHF) - Goal Mag>2 and K+>4 - Still in afib with elevated rates 100-120. Can add amiodarone to see if she will convert. Would plan for TEE/DCCV tomorrow as she has not converted on her own.   Obesity hypoventilation - CPAP/Bipap per IM  For questions or updates, please contact [facility] Please consult [email or web address] for contact info under        Signed, Javiana Anwar David Stall, PA-C  08/04/2019, 8:29 AM

## 2019-08-05 ENCOUNTER — Inpatient Hospital Stay (HOSPITAL_COMMUNITY): Payer: BLUE CROSS/BLUE SHIELD | Admitting: Certified Registered Nurse Anesthetist

## 2019-08-05 ENCOUNTER — Encounter (HOSPITAL_COMMUNITY)
Admission: EM | Disposition: A | Discharge status: Home / Self Care | Payer: Self-pay | Source: Home / Self Care | Attending: Internal Medicine

## 2019-08-05 ENCOUNTER — Telehealth: Payer: Self-pay | Admitting: General Practice

## 2019-08-05 ENCOUNTER — Inpatient Hospital Stay (HOSPITAL_COMMUNITY): Payer: BLUE CROSS/BLUE SHIELD

## 2019-08-05 DIAGNOSIS — I4891 Unspecified atrial fibrillation: Secondary | ICD-10-CM

## 2019-08-05 DIAGNOSIS — I340 Nonrheumatic mitral (valve) insufficiency: Secondary | ICD-10-CM

## 2019-08-05 DIAGNOSIS — Z8709 Personal history of other diseases of the respiratory system: Secondary | ICD-10-CM

## 2019-08-05 HISTORY — PX: CARDIOVERSION: SHX1299

## 2019-08-05 HISTORY — PX: TEE WITHOUT CARDIOVERSION: SHX5443

## 2019-08-05 LAB — BASIC METABOLIC PANEL
Anion gap: 9 (ref 5–15)
BUN: 12 mg/dL (ref 6–20)
CO2: 24 mmol/L (ref 22–32)
Calcium: 8.9 mg/dL (ref 8.9–10.3)
Chloride: 105 mmol/L (ref 98–111)
Creatinine, Ser: 0.92 mg/dL (ref 0.44–1.00)
GFR calc Af Amer: >60 mL/min (ref >60)
GFR calc non Af Amer: >60 mL/min (ref >60)
Glucose, Bld: 123 mg/dL — ABNORMAL HIGH (ref 70–99)
Potassium: 4.1 mmol/L (ref 3.5–5.1)
Sodium: 138 mmol/L (ref 135–145)

## 2019-08-05 LAB — CBC
HCT: 37.1 % (ref 36.0–46.0)
Hemoglobin: 11.2 g/dL — ABNORMAL LOW (ref 12.0–15.0)
MCH: 26.5 pg (ref 26.0–34.0)
MCHC: 30.2 g/dL (ref 30.0–36.0)
MCV: 87.9 fL (ref 80.0–100.0)
Platelets: 273 10*3/uL (ref 150–400)
RBC: 4.22 MIL/uL (ref 3.87–5.11)
RDW: 16.2 % — ABNORMAL HIGH (ref 11.5–15.5)
WBC: 8.5 10*3/uL (ref 4.0–10.5)
nRBC: 0 % (ref 0.0–0.2)

## 2019-08-05 LAB — PROTIME-INR
INR: 1.1 (ref 0.8–1.2)
Prothrombin Time: 13.5 seconds (ref 11.4–15.2)

## 2019-08-05 SURGERY — ECHOCARDIOGRAM, TRANSESOPHAGEAL
Anesthesia: General

## 2019-08-05 MED ORDER — APIXABAN 5 MG PO TABS: 5.0000 mg | ORAL_TABLET | Freq: Two times a day (BID) | ORAL | 0 refills | Status: DC

## 2019-08-05 MED ORDER — SODIUM CHLORIDE 0.9 % IV SOLN
INTRAVENOUS | Status: AC | PRN
  Administered 2019-08-05: 500 mL via INTRAVENOUS

## 2019-08-05 MED ORDER — MIDAZOLAM HCL 5 MG/5ML IJ SOLN
INTRAMUSCULAR | Status: DC | PRN
  Administered 2019-08-05: 2 mg via INTRAVENOUS

## 2019-08-05 MED ORDER — LIDOCAINE 2% (20 MG/ML) 5 ML SYRINGE
INTRAMUSCULAR | Status: DC | PRN
  Administered 2019-08-05: 100 mg via INTRAVENOUS

## 2019-08-05 MED ORDER — SODIUM CHLORIDE 0.9 % IV SOLN: INTRAVENOUS | Status: DC

## 2019-08-05 MED ORDER — FENTANYL CITRATE (PF) 100 MCG/2ML IJ SOLN
INTRAMUSCULAR | Status: DC | PRN
  Administered 2019-08-05: 50 ug via INTRAVENOUS

## 2019-08-05 MED ORDER — ONDANSETRON HCL 4 MG/2ML IJ SOLN
INTRAMUSCULAR | Status: DC | PRN
  Administered 2019-08-05: 4 mg via INTRAVENOUS

## 2019-08-05 MED ORDER — PHENYLEPHRINE 40 MCG/ML (10ML) SYRINGE FOR IV PUSH (FOR BLOOD PRESSURE SUPPORT)
PREFILLED_SYRINGE | INTRAVENOUS | Status: DC | PRN
  Administered 2019-08-05: 200 ug via INTRAVENOUS
  Administered 2019-08-05: 120 ug via INTRAVENOUS

## 2019-08-05 MED ORDER — PROPOFOL 10 MG/ML IV BOLUS
INTRAVENOUS | Status: DC | PRN
  Administered 2019-08-05: 250 mg via INTRAVENOUS

## 2019-08-05 MED ORDER — DEXAMETHASONE SODIUM PHOSPHATE 10 MG/ML IJ SOLN
INTRAMUSCULAR | Status: DC | PRN
  Administered 2019-08-05: 10 mg via INTRAVENOUS

## 2019-08-05 MED ORDER — SUCCINYLCHOLINE CHLORIDE 200 MG/10ML IV SOSY
PREFILLED_SYRINGE | INTRAVENOUS | Status: DC | PRN
  Administered 2019-08-05: 160 mg via INTRAVENOUS

## 2019-08-05 MED ORDER — DILTIAZEM HCL ER COATED BEADS 240 MG PO CP24
240.0000 mg | ORAL_CAPSULE | Freq: Every day | ORAL | Status: DC
  Administered 2019-08-05: 240 mg via ORAL
  Filled 2019-08-05 (×2): qty 1

## 2019-08-05 MED ORDER — DILTIAZEM HCL ER COATED BEADS 240 MG PO CP24: 240.0000 mg | ORAL_CAPSULE | Freq: Every day | ORAL | 0 refills | Status: DC

## 2019-08-05 NOTE — Progress Notes (Signed)
  Echocardiogram Echocardiogram Transesophageal has been performed.  Delcie Roch 08/05/2019, 1:40 PM

## 2019-08-05 NOTE — Interval H&P Note (Signed)
History and Physical Interval Note:  08/05/2019 12:16 PM  Alyssa Blanchard  has presented today for surgery, with the diagnosis of AFIB.  The various methods of treatment have been discussed with the patient and family. After consideration of risks, benefits and other options for treatment, the patient has consented to  Procedure(s): TRANSESOPHAGEAL ECHOCARDIOGRAM (TEE) (N/A) CARDIOVERSION (N/A) as a surgical intervention.  The patient's history has been reviewed, patient examined, no change in status, stable for surgery.  I have reviewed the patient's chart and labs.  Questions were answered to the patient's satisfaction.     Kristeen Miss

## 2019-08-05 NOTE — Transfer of Care (Signed)
Immediate Anesthesia Transfer of Care Note  Patient: Alyssa Blanchard  Procedure(s) Performed: TRANSESOPHAGEAL ECHOCARDIOGRAM (TEE) (N/A ) CARDIOVERSION (N/A )  Patient Location: Endoscopy Unit  Anesthesia Type:General  Level of Consciousness: awake, alert , oriented and patient cooperative  Airway & Oxygen Therapy: Patient Spontanous Breathing and Patient connected to face mask oxygen  Post-op Assessment: Report given to RN and Post -op Vital signs reviewed and stable  Post vital signs: Reviewed and stable  Last Vitals:  Vitals Value Taken Time  BP    Temp    Pulse 74 08/05/19 1334  Resp 20 08/05/19 1334  SpO2 95 % 08/05/19 1334  Vitals shown include unvalidated device data.  Last Pain:  Vitals:   08/05/19 1215  TempSrc: Oral  PainSc: 0-No pain      Patients Stated Pain Goal: 0 (08/03/19 0500)  Complications: No apparent anesthesia complications

## 2019-08-05 NOTE — Discharge Summary (Addendum)
Physician Discharge Summary  Gracelee Stemmler TMH:[id number] DOB: 1970/10/15   PCP: No primary care provider on file.  Admit date: 08/02/2019 Discharge date: 08/05/2019 Length of Stay: 2 days   Code Status: Full Code  Admitted From:  Home Discharged to:   Home Home Health:  None  Equipment/Devices:  None Discharge Condition:  Stable  Recommendations for Outpatient Follow-up   1. Follow up with Cardiology on 08/15/19 2. Needs outpatient sleep study 3. Encourage lifestyle modification to reduce atrial fibrillation recurrence risk  Hospital Summary  This is a 49 year old female with history of asthma who was experiencing increased shortness of breath on exertion over the past 4 to 5 days with some lower extremity edema and chest tightness but no chest pain.  Went to her PCP on 5/25 who noted that she was tachycardic and was referred to the ED.Marland Kitchen   ED Course: In the ER patient was found to be in A. fib with RVR and was started on Cardizem infusion.  TSH was normal high sensitive troponin was negative.  BNP was 402.  CT angiogram of the chest shows features concerning for possible CHF with some effusion.  Pneumonia cannot be ruled out but patient was afebrile and had no leukocytosis.  Patient was given 1 dose of Lasix admitted for further management of A. fib with RVR which is new onset.    5/26: Cardiology consulted   5/27: Patient still in A. fib with RVR, cardiology plan to transfer from [facility] to [facility] for DCCV 5/28: successful TEE/DCCV at [facility]. Discharged in stable condition on eliquis and Diltiazem 240 mg daily with outpatient cardiology follow up scheduled.   A & P   Principal Problem:   Atrial fibrillation with RVR (HCC) Active Problems:   Hypoventilation associated with obesity syndrome (HCC)   History of asthma   Bipolar I disorder, most recent episode (or current) manic (HCC)   Paroxysmal atrial fibrillation with RVR s/p successful TEE/DCCV on 5/28 a. afib was poorly controlled during  hospitalization despite cardizem drip and beta blockade b. Underwent cardioversion and discharged with Eliquis and Cardizem 240 mg daily with outpatient follow up c. TSH unremarkable d. Echo: Overall difficult visualization but with EF 60 to 65% otherwise unremarkable.   e. TEE with EF 30 to 35% though not clinically in systolic heart failure. I suspect decreased EF from stunned myocardium post cardioversion?  follow-up outpatient f. Discussed with cardiology, ok for discharge today with Cardizem to 40 mg daily g. Follow-up with cardiology outpatient   History of asthma not currently in exacerbation   Morbid obesity a. Risk factor for A. fib b. Outpatient follow-up c. Needs outpatient sleep study d. Body mass index is 64.57 kg/m.   Volume overload, A. fib with RVR induced a. status improved with Lasix  b. Echo findings as above     Consultants  . cardiology  Procedures  . TEE/DCCV 5/28  Antibiotics   Anti-infectives (From admission, onward)    None        Subjective  Admits that she slept last night.  Currently no issues.  He is eagerly anticipating her cardioversion this afternoon.  Objective   Discharge Exam: Vitals:   08/05/19 1355 08/05/19 1405  BP: (!) 99/58 (!) 116/59  Pulse: 73 73  Resp: 17 16  Temp:    SpO2: 91% 95%   Vitals:   08/05/19 1335 08/05/19 1345 08/05/19 1355 08/05/19 1405  BP: 105/83 95/62 (!) 99/58 (!) 116/59  Pulse: 74 76 73 73  Resp: 20 17 17 16   Temp: (!) 97.5 F (36.4 C)     TempSrc: Temporal     SpO2: 95% 97% 91% 95%  Weight:      Height:        Physical Exam Vitals and nursing note reviewed.  Constitutional:      Appearance: Normal appearance.  HENT:     Head: Normocephalic and atraumatic.  Eyes:     Conjunctiva/sclera: Conjunctivae normal.  Pulmonary:     Effort: Pulmonary effort is normal.     Breath sounds: Normal breath sounds.  Abdominal:     General: Abdomen is flat.     Palpations: Abdomen is soft.   Musculoskeletal:        General: No swelling or tenderness.  Skin:    Coloration: Skin is not jaundiced or pale.  Neurological:     Mental Status: She is alert. Mental status is at baseline.  Psychiatric:        Mood and Affect: Mood normal.        Behavior: Behavior normal.     The results of significant diagnostics from this hospitalization (including imaging, microbiology, ancillary and laboratory) are listed below for reference.     Microbiology: Recent Results (from the past 240 hour(s))  SARS Coronavirus 2 by RT PCR (hospital order, performed in [facility] hospital lab) Nasopharyngeal Nasopharyngeal Swab     Status: None   Collection Time: 08/02/19  8:31 PM   Specimen: Nasopharyngeal Swab  Result Value Ref Range Status   SARS Coronavirus 2 NEGATIVE NEGATIVE Final    Comment: (NOTE) SARS-CoV-2 target nucleic acids are NOT DETECTED. The SARS-CoV-2 RNA is generally detectable in upper and lower respiratory specimens during the acute phase of infection. The lowest concentration of SARS-CoV-2 viral copies this assay can detect is 250 copies / mL. A negative result does not preclude SARS-CoV-2 infection and should not be used as the sole basis for treatment or other patient management decisions.  A negative result may occur with improper specimen collection / handling, submission of specimen other than nasopharyngeal swab, presence of viral mutation(s) within the areas targeted by this assay, and inadequate number of viral copies (<250 copies / mL). A negative result must be combined with clinical observations, patient history, and epidemiological information. Fact Sheet for Patients:   08/04/19 Fact Sheet for Healthcare Providers: BoilerBrush.com.cy This test is not yet approved or cleared  by the https://pope.com/ FDA and has been authorized for detection and/or diagnosis of SARS-CoV-2 by FDA under an Emergency Use  Authorization (EUA).  This EUA will remain in effect (meaning this test can be used) for the duration of the COVID-19 declaration under Section 564(b)(1) of the Act, 21 U.S.C. section 360bbb-3(b)(1), unless the authorization is terminated or revoked sooner. Performed at [facility], [address] [place], [place]      Labs: BNP (last 3 results) Recent Labs    08/02/19 1854  BNP 402.6*   Basic Metabolic Panel: Recent Labs  Lab 08/02/19 1740 08/02/19 1752 08/03/19 0500 08/03/19 0527 08/04/19 0410 08/05/19 0401  NA 137  --  138  --  135 138  K 3.9  --  3.5  --  3.7 4.1  CL 106  --  105  --  102 105  CO2 21*  --  22  --  20* 24  GLUCOSE 114*  --  127*  --  124* 123*  BUN 10  --  10  --  10 12  CREATININE 0.93  --  0.99  --  0.75 0.92  CALCIUM 8.6*  --  8.5*  --  8.3* 8.9  MG  --  2.0  --  1.8  --   --    Liver Function Tests: No results for input(s): AST, ALT, ALKPHOS, BILITOT, PROT, ALBUMIN in the last 168 hours. No results for input(s): LIPASE, AMYLASE in the last 168 hours. No results for input(s): AMMONIA in the last 168 hours. CBC: Recent Labs  Lab 08/02/19 1740 08/03/19 0500 08/04/19 0410 08/05/19 0401  WBC 10.3 9.7 8.5 8.5  NEUTROABS 7.8*  --   --   --   HGB 12.2 10.8* 11.0* 11.2*  HCT 39.6 35.4* 36.5 37.1  MCV 85.0 86.3 86.3 87.9  PLT 293 246 239 273   Cardiac Enzymes: No results for input(s): CKTOTAL, CKMB, CKMBINDEX, TROPONINI in the last 168 hours. BNP: Invalid input(s): POCBNP CBG: No results for input(s): GLUCAP in the last 168 hours. D-Dimer Recent Labs    08/02/19 1740  DDIMER 1.11*   Hgb A1c No results for input(s): HGBA1C in the last 72 hours. Lipid Profile No results for input(s): CHOL, HDL, LDLCALC, TRIG, CHOLHDL, LDLDIRECT in the last 72 hours. Thyroid function studies Recent Labs    08/02/19 1740  TSH 2.735   Anemia work up No results for input(s): VITAMINB12, FOLATE, FERRITIN, TIBC, IRON,  RETICCTPCT in the last 72 hours. Urinalysis    Component Value Date/Time   COLORURINE YELLOW 04/15/2019 1355   APPEARANCEUR HAZY (A) 04/15/2019 1355   LABSPEC 1.014 04/15/2019 1355   PHURINE 6.0 04/15/2019 1355   GLUCOSEU NEGATIVE 04/15/2019 1355   HGBUR NEGATIVE 04/15/2019 1355   BILIRUBINUR NEGATIVE 04/15/2019 1355   BILIRUBINUR negative 07/28/2016 1614   BILIRUBINUR neg 08/29/2014 1528   KETONESUR 20 (A) 04/15/2019 1355   PROTEINUR NEGATIVE 04/15/2019 1355   UROBILINOGEN 0.2 07/28/2016 1614   NITRITE NEGATIVE 04/15/2019 1355   LEUKOCYTESUR NEGATIVE 04/15/2019 1355   Sepsis Labs Invalid input(s): PROCALCITONIN,  WBC,  LACTICIDVEN Microbiology Recent Results (from the past 240 hour(s))  SARS Coronavirus 2 by RT PCR (hospital order, [facility]) Nasopharyngeal Nasopharyngeal Swab     Status: None   Collection Time: 08/02/19  8:31 PM   Specimen: Nasopharyngeal Swab  Result Value Ref Range Status   SARS Coronavirus 2 NEGATIVE NEGATIVE Final    Comment: (NOTE) SARS-CoV-2 target nucleic acids are NOT DETECTED. The SARS-CoV-2 RNA is generally detectable in upper and lower respiratory specimens during the acute phase of infection. The lowest concentration of SARS-CoV-2 viral copies this assay can detect is 250 copies / mL. A negative result does not preclude SARS-CoV-2 infection and should not be used as the sole basis for treatment or other patient management decisions.  A negative result may occur with improper specimen collection / handling, submission of specimen other than nasopharyngeal swab, presence of viral mutation(s) within the areas targeted by this assay, and inadequate number of viral copies (<250 copies / mL). A negative result must be combined with clinical observations, patient history, and epidemiological information. Fact Sheet for Patients:   BoilerBrush.com.cyhttps://www.fda.gov/media/136312/download Fact Sheet for Healthcare  Providers: https://pope.com/https://www.fda.gov/media/136313/download This test is not yet approved or cleared  by the [place] FDA and has been authorized for detection and/or diagnosis of SARS-CoV-2 by FDA under an Emergency Use Authorization (EUA).  This EUA will remain in effect (meaning this test can be used) for the duration of the COVID-19 declaration under  Section 564(b)(1) of the Act, 21 U.S.C. section 360bbb-3(b)(1), unless the authorization is terminated or revoked sooner. Performed at [facility], [address] [place], [place] [address]     Discharge Instructions     Discharge Instructions     Diet - low sodium heart healthy   Complete by: As directed    Discharge instructions   Complete by: As directed    - start Eliquis 5 mg twice daily. This is a blood thinner and increases your risk of bleeding. Monitor your stool for blood and be careful not to fall as this increases your chance of bleeding. Notify your physician if you notice this.  - Start Diltiazem 240 mg daily for your atrial fibrillation - follow up with cardiology in the office - if you have recurrence of your symptoms then return to the ED.   Increase activity slowly   Complete by: As directed       Allergies as of 08/05/2019       Reactions   Gabapentin Anaphylaxis   Topiramate Other (See Comments)   Jaw tightness and chest discomfort    Imitrex [sumatriptan] Other (See Comments)   Increased heart rate   Montelukast Other (See Comments)   Nightmares    Other    Pt states no narcotics ever!         Medication List     STOP taking these medications    albuterol 108 (90 Base) MCG/ACT inhaler Commonly known as: VENTOLIN HFA   ARIPiprazole ER 400 MG Srer injection Commonly known as: ABILIFY MAINTENA   benzonatate 100 MG capsule Commonly known as: Tessalon Perles   benztropine 1 MG tablet Commonly known as: COGENTIN   methylPREDNISolone acetate 80 MG/ML injection Commonly  known as: DEPO-MEDROL   montelukast 10 MG tablet Commonly known as: SINGULAIR   propranolol 20 MG tablet Commonly known as: INDERAL   risperidone 4 MG tablet Commonly known as: RISPERDAL   SUMAtriptan 100 MG tablet Commonly known as: Imitrex   traZODone 150 MG tablet Commonly known as: DESYREL       TAKE these medications    apixaban 5 MG Tabs tablet Commonly known as: ELIQUIS Take 1 tablet (5 mg total) by mouth 2 (two) times daily.   Breo Ellipta 200-25 MCG/INH Aepb Generic drug: fluticasone furoate-vilanterol Inhale 1 puff into the lungs daily.   diltiazem 240 MG 24 hr capsule Commonly known as: CARDIZEM CD Take 1 capsule (240 mg total) by mouth daily. Start taking on: Aug 06, 2019   ipratropium 0.03 % nasal spray Commonly known as: ATROVENT Place 1 spray into both nostrils daily as needed for rhinitis.   levalbuterol 45 MCG/ACT inhaler Commonly known as: XOPENEX HFA Inhale 2 puffs into the lungs every 6 (six) hours as needed for wheezing or shortness of breath.   pantoprazole 40 MG tablet Commonly known as: PROTONIX Take 1 tablet (40 mg total) by mouth 2 (two) times daily.       Follow-up Information     Ronney Asters, NP Follow up on 08/15/2019.   Specialty: Cardiology Why: @2 :15PM Contact information: [address] [place] [phone]           Allergies  Allergen Reactions  . Gabapentin Anaphylaxis  . Topiramate Other (See Comments)    Jaw tightness and chest discomfort   . Imitrex [Sumatriptan] Other (See Comments)    Increased heart rate  . Montelukast Other (See Comments)    Nightmares   .  Other     Pt states no narcotics ever!      Dispo: The patient is from: Home              Anticipated d/c is to: Home              Anticipated d/c date is: today              Patient currently is medically stable to d/c.        Time coordinating discharge: Over 30 minutes   SIGNED:   Harold Hedge,  D.O. [facility] Pager: [phone]  08/05/2019, 3:22 PM

## 2019-08-05 NOTE — Telephone Encounter (Signed)
Alyssa Blanchard is scheduled for a TOC appointment on 08/15/19 at 2:15 Pm with Edd Fabian per [name].

## 2019-08-05 NOTE — Plan of Care (Signed)
  Problem: Clinical Measurements: Goal: Diagnostic test results will improve Outcome: Adequate for Discharge   Problem: Activity: Goal: Risk for activity intolerance will decrease Outcome: Adequate for Discharge   Problem: Nutrition: Goal: Adequate nutrition will be maintained Outcome: Adequate for Discharge   Problem: Activity: Goal: Capacity to carry out activities will improve Outcome: Adequate for Discharge

## 2019-08-05 NOTE — CV Procedure (Signed)
    Transesophageal Echocardiogram Note  Alyssa Blanchard [id number] [birth year]  Procedure: Transesophageal Echocardiogram Indications: atrial fib   Procedure Details Consent: Obtained Time Out: Verified patient identification, verified procedure, site/side was marked, verified correct patient position, special equipment/implants available, Radiology Safety Procedures followed,  medications/allergies/relevent history reviewed, required imaging and test results available.  Performed  Medications:  During this procedure the patient is administered propofol + general anesthesia by CRNA Sarah and Dr. Malen Gauze.  Lidocaine 100 mg  Propofol 250 mg IV  succinyl choline  Sevoflurane - gas anesthesia Decadron zofran Versed 2 mg Fentanyl 50 mcg    Left Ventrical:  Mod - severe LV dysfunction - EF 25-30%.    Mitral Valve: mild MR   Aortic Valve: normal AV   Tricuspid Valve: normal TV   Pulmonic Valve: normal   Left Atrium/ Left atrial appendage: no thrombi.  Viewed at multiple levels and in biplane.  No thrombus   Atrial septum: no ASD or PFO   Aorta: normal    Complications: No apparent complications Patient did tolerate procedure well.      Cardioversion Note  Alyssa Blanchard [id number] [birth year]  Procedure: DC Cardioversion Indications: atrial fib   Procedure Details Consent: Obtained Time Out: Verified patient identification, verified procedure, site/side was marked, verified correct patient position, special equipment/implants available, Radiology Safety Procedures followed,  medications/allergies/relevent history reviewed, required imaging and test results available.  Performed  The patient has been on adequate anticoagulation.  The patient received IV  ( see above)  for sedation.  Synchronous cardioversion was performed at 200  joules.  The cardioversion was successful     Complications: No apparent complications Patient did tolerate procedure well.   Alyssa Blanchard, Montez Hageman., MD, [facility] 08/05/2019, 1:22 PM

## 2019-08-05 NOTE — Anesthesia Procedure Notes (Signed)

## 2019-08-05 NOTE — Progress Notes (Signed)
PROGRESS NOTE    Alyssa Blanchard    Code Status: Full Code  NFA:[id number] DOB: 25-Sep-1970 DOA: 08/02/2019 LOS: 2 days  PCP: No primary care provider on file. CC:  Chief Complaint  Patient presents with  . Asthma       Hospital Summary   This is a 49 year old female with history of asthma who was experiencing increased shortness of breath on exertion over the past 4 to 5 days with some lower extremity edema and chest tightness but no chest pain.  Went to her PCP on 5/25 who noted that she was tachycardic and was referred to the ED.Marland Kitchen  ED Course: In the ER patient was found to be in A. fib with RVR and was started on Cardizem infusion.  TSH was normal high sensitive troponin was negative.  BNP was 402.  CT angiogram of the chest shows features concerning for possible CHF with some effusion.  Pneumonia cannot be ruled out but patient was afebrile and had no leukocytosis.  Patient was given 1 dose of Lasix admitted for further management of A. fib with RVR which is new onset.   5/26: Cardiology consulted  5/27: Patient still in A. fib with RVR, cardiology plan to transfer from [facility] to [facility] for DCCV 5/28: Cardioversion at [place] Problem:   Atrial fibrillation with RVR ([place]) Active Problems:   Hypoventilation associated with obesity syndrome (H[facility])   History of asthma   Bipolar I disorder, most recent episode (or current) manic ([place])   1. Paroxysmal atrial fibrillation with RVR a. Poorly controlled despite Cardizem drip at 15 mg/h b. TSH unremarkable c. Echo: Overall difficult visualization but with EF 60 to 65% otherwise unremarkable. d. cardioversion at [facility] today  2. History of asthma not currently in exacerbation  3. Morbid obesity a. Risk factor for A. fib b. Outpatient follow-up c. Needs outpatient sleep study  4. Volume overload, likely A. fib with RVR induced a. Plan status improved with Lasix and echo as above  DVT prophylaxis: Heparin Family Communication:  Patient updated at bedside Disposition Plan:  Status is: Inpatient  Remains inpatient appropriate because:IV treatments appropriate due to intensity of illness or inability to take PO   Dispo: The patient is from: Home              Anticipated d/c is to: Home              Anticipated d/c date is: 2 days              Patient currently is not medically stable to d/c.           Pressure injury documentation    None  Consultants  Cardiology   Procedures  5/28: TEE/DCCV  Antibiotics   Anti-infectives (From admission, onward)   None        Subjective   Admits that she slept last night.  Currently no issues.  He is eagerly anticipating her cardioversion this afternoon. Objective   Vitals:   08/05/19 0509 08/05/19 1215 08/05/19 1335 08/05/19 1345  BP: 105/72 123/78 105/83 95/62  Pulse: 86 (!) 106 74 76  Resp: 20  20 17   Temp: 98.4 F (36.9 C) 98.6 F (37 C) (!) 97.5 F (36.4 C)   TempSrc: Oral Oral Temporal   SpO2: 97% 98% 95% 97%  Weight:  (!) 176 kg    Height:  5\' 5"  (1.651 m)      Intake/Output Summary (Last 24  hours) at 08/05/2019 1356 Last data filed at 08/05/2019 1326 Gross per 24 hour  Intake 972.83 ml  Output 2800 ml  Net -1827.17 ml   Filed Weights   08/04/19 0613 08/05/19 0500 08/05/19 1215  Weight: (!) 175.1 kg (!) 176 kg (!) 176 kg    Examination:  Physical Exam Vitals and nursing note reviewed.  Constitutional:      Appearance: She is obese.  HENT:     Head: Normocephalic and atraumatic.  Eyes:     Conjunctiva/sclera: Conjunctivae normal.  Cardiovascular:     Rate and Rhythm: Tachycardia present. Rhythm irregular.  Pulmonary:     Effort: Pulmonary effort is normal.     Breath sounds: Normal breath sounds.  Abdominal:     General: Abdomen is flat.     Palpations: Abdomen is soft.  Musculoskeletal:        General: No swelling or tenderness.  Skin:    Coloration: Skin is not jaundiced or pale.  Neurological:     Mental  Status: She is alert. Mental status is at baseline.  Psychiatric:        Mood and Affect: Mood normal.        Behavior: Behavior normal.     Data Reviewed: I have personally reviewed following labs and imaging studies  CBC: Recent Labs  Lab 08/02/19 1740 08/03/19 0500 08/04/19 0410 08/05/19 0401  WBC 10.3 9.7 8.5 8.5  NEUTROABS 7.8*  --   --   --   HGB 12.2 10.8* 11.0* 11.2*  HCT 39.6 35.4* 36.5 37.1  MCV 85.0 86.3 86.3 87.9  PLT 293 246 239 273   Basic Metabolic Panel: Recent Labs  Lab 08/02/19 1740 08/02/19 1752 08/03/19 0500 08/03/19 0527 08/04/19 0410 08/05/19 0401  NA 137  --  138  --  135 138  K 3.9  --  3.5  --  3.7 4.1  CL 106  --  105  --  102 105  CO2 21*  --  22  --  20* 24  GLUCOSE 114*  --  127*  --  124* 123*  BUN 10  --  10  --  10 12  CREATININE 0.93  --  0.99  --  0.75 0.92  CALCIUM 8.6*  --  8.5*  --  8.3* 8.9  MG  --  2.0  --  1.8  --   --    GFR: Estimated Creatinine Clearance: 123.5 mL/min (by C-G formula based on SCr of 0.92 mg/dL). Liver Function Tests: No results for input(s): AST, ALT, ALKPHOS, BILITOT, PROT, ALBUMIN in the last 168 hours. No results for input(s): LIPASE, AMYLASE in the last 168 hours. No results for input(s): AMMONIA in the last 168 hours. Coagulation Profile: Recent Labs  Lab 08/03/19 0134 08/05/19 0802  INR 1.1 1.1   Cardiac Enzymes: No results for input(s): CKTOTAL, CKMB, CKMBINDEX, TROPONINI in the last 168 hours. BNP (last 3 results) No results for input(s): PROBNP in the last 8760 hours. HbA1C: No results for input(s): HGBA1C in the last 72 hours. CBG: No results for input(s): GLUCAP in the last 168 hours. Lipid Profile: No results for input(s): CHOL, HDL, LDLCALC, TRIG, CHOLHDL, LDLDIRECT in the last 72 hours. Thyroid Function Tests: Recent Labs    08/02/19 1740  TSH 2.735   Anemia Panel: No results for input(s): VITAMINB12, FOLATE, FERRITIN, TIBC, IRON, RETICCTPCT in the last 72 hours. Sepsis  Labs: Recent Labs  Lab 08/03/19 0527  PROCALCITON <0.10  Recent Results (from the past 240 hour(s))  SARS Coronavirus 2 by RT PCR (hospital order, performed in [facility] hospital lab) Nasopharyngeal Nasopharyngeal Swab     Status: None   Collection Time: 08/02/19  8:31 PM   Specimen: Nasopharyngeal Swab  Result Value Ref Range Status   SARS Coronavirus 2 NEGATIVE NEGATIVE Final    Comment: (NOTE) SARS-CoV-2 target nucleic acids are NOT DETECTED. The SARS-CoV-2 RNA is generally detectable in upper and lower respiratory specimens during the acute phase of infection. The lowest concentration of SARS-CoV-2 viral copies this assay can detect is 250 copies / mL. A negative result does not preclude SARS-CoV-2 infection and should not be used as the sole basis for treatment or other patient management decisions.  A negative result may occur with improper specimen collection / handling, submission of specimen other than nasopharyngeal swab, presence of viral mutation(s) within the areas targeted by this assay, and inadequate number of viral copies (<250 copies / mL). A negative result must be combined with clinical observations, patient history, and epidemiological information. Fact Sheet for Patients:   BoilerBrush.com.cy Fact Sheet for Healthcare Providers: https://pope.com/ This test is not yet approved or cleared  by the [place] FDA and has been authorized for detection and/or diagnosis of SARS-CoV-2 by FDA under an Emergency Use Authorization (EUA).  This EUA will remain in effect (meaning this test can be used) for the duration of the COVID-19 declaration under Section 564(b)(1) of the Act, 21 U.S.C. section 360bbb-3(b)(1), unless the authorization is terminated or revoked sooner. Performed at [facility], [address], [place], [place] [address]          Radiology Studies: No results found.       Scheduled Meds: . [MAR Hold] apixaban  5 mg Oral BID  . diltiazem  240 mg Oral Daily  . [MAR Hold] fluticasone furoate-vilanterol  1 puff Inhalation Daily  . [MAR Hold] metoprolol tartrate  50 mg Oral BID   Continuous Infusions: . sodium chloride       Time spent: 26 minutes with over 50% of the time coordinating the patient's care    Jae Dire, DO [facility] Pager [phone]  Call night coverage person covering after 7pm

## 2019-08-05 NOTE — Progress Notes (Signed)
Progress Note  Patient Name: Alyssa Blanchard Date of Encounter: 08/05/2019  Primary Cardiologist: Chilton Si, MD   Subjective   Patient remains in afib with rates around 100bpm. Plan for TEE/DCCV today.   Inpatient Medications    Scheduled Meds: . apixaban  5 mg Oral BID  . fluticasone furoate-vilanterol  1 puff Inhalation Daily  . metoprolol tartrate  50 mg Oral BID   Continuous Infusions: . sodium chloride    . diltiazem (CARDIZEM) infusion 15 mg/hr (08/05/19 0702)  . heparin Stopped (08/04/19 1841)   PRN Meds: levalbuterol, lip balm, ondansetron **OR** ondansetron (ZOFRAN) IV   Vital Signs    Vitals:   08/04/19 2137 08/04/19 2139 08/05/19 0500 08/05/19 0509  BP: 120/79 120/79  105/72  Pulse: (!) 103 (!) 103  86  Resp:  20  20  Temp:  97.6 F (36.4 C)  98.4 F (36.9 C)  TempSrc:    Oral  SpO2:  97%  97%  Weight:   (!) 176 kg   Height:        Intake/Output Summary (Last 24 hours) at 08/05/2019 0909 Last data filed at 08/05/2019 0533 Gross per 24 hour  Intake 726.16 ml  Output 2800 ml  Net -2073.84 ml   Last 3 Weights 08/05/2019 08/04/2019 08/03/2019  Weight (lbs) 388 lb 0.2 oz 386 lb 388 lb 0.2 oz  Weight (kg) 176 kg 175.088 kg 176 kg  Some encounter information is confidential and restricted. Go to Review Flowsheets activity to see all data.      Telemetry    afib rates around 100, intermittently up to 120 bpm - Personally Reviewed  ECG    No new - Personally Reviewed  Physical Exam   GEN: No acute distress.   Neck: No JVD Cardiac: Irreg Irreg, no murmurs, rubs, or gallops.  Respiratory: Clear to auscultation bilaterally. GI: Soft, nontender, non-distended  MS: No edema; No deformity. Neuro:  Nonfocal  Psych: Normal affect   Labs    High Sensitivity Troponin:   Recent Labs  Lab 08/02/19 1740 08/02/19 1928 08/02/19 2109  TROPONINIHS 8 9 8       Chemistry Recent Labs  Lab 08/03/19 0500 08/04/19 0410 08/05/19 0401  NA 138 135  138  K 3.5 3.7 4.1  CL 105 102 105  CO2 22 20* 24  GLUCOSE 127* 124* 123*  BUN 10 10 12   CREATININE 0.99 0.75 0.92  CALCIUM 8.5* 8.3* 8.9  GFRNONAA >60 >60 >60  GFRAA >60 >60 >60  ANIONGAP 11 13 9      Hematology Recent Labs  Lab 08/03/19 0500 08/04/19 0410 08/05/19 0401  WBC 9.7 8.5 8.5  RBC 4.10 4.23 4.22  HGB 10.8* 11.0* 11.2*  HCT 35.4* 36.5 37.1  MCV 86.3 86.3 87.9  MCH 26.3 26.0 26.5  MCHC 30.5 30.1 30.2  RDW 16.0* 16.0* 16.2*  PLT 246 239 273    BNP Recent Labs  Lab 08/02/19 1854  BNP 402.6*     DDimer  Recent Labs  Lab 08/02/19 1740  DDIMER 1.11*     Radiology    ECHOCARDIOGRAM COMPLETE  Result Date: 08/03/2019    ECHOCARDIOGRAM REPORT   Patient Name:   Alyssa Blanchard Date of Exam: 08/03/2019 Medical Rec #:  08/04/19   Height:       66.0 in Accession #:    08/05/2019  Weight:       389.0 lb Date of Birth:  [birth year]   BSA:  2.653 m Patient Age:    7 years    BP:           99/65 mmHg Patient Gender: F           HR:           117 bpm. Exam Location:  Inpatient Procedure: 2D Echo Indications:    atrial fibrillation 427.31  History:        Patient has no prior history of Echocardiogram examinations.                 Risk Factors:Former Smoker.  Sonographer:    Jannett Celestine RDCS (AE) Referring Phys: [place]  Sonographer Comments: Technically difficult study due to poor echo windows and patient is morbidly obese. Image acquisition challenging due to patient body habitus. see comments IMPRESSIONS  1. Extremely poor windows for TTE. Global LV function is normal.  2. Left ventricular ejection fraction, by estimation, is 60 to 65%. The left ventricle has normal function. Left ventricular endocardial border not optimally defined to evaluate regional wall motion. Left ventricular diastolic function could not be evaluated.  3. Right ventricular systolic function is normal. The right ventricular size is normal. Tricuspid regurgitation signal is  inadequate for assessing PA pressure.  4. The mitral valve is grossly normal. No evidence of mitral valve regurgitation. No evidence of mitral stenosis.  5. The aortic valve is tricuspid. Aortic valve regurgitation is not visualized. No aortic stenosis is present. FINDINGS  Left Ventricle: Left ventricular ejection fraction, by estimation, is 60 to 65%. The left ventricle has normal function. Left ventricular endocardial border not optimally defined to evaluate regional wall motion. Definity contrast agent was given IV to delineate the left ventricular endocardial borders. The left ventricular internal cavity size was normal in size. There is no left ventricular hypertrophy. Left ventricular diastolic function could not be evaluated due to atrial fibrillation. Left ventricular diastolic function could not be evaluated. Right Ventricle: The right ventricular size is normal. No increase in right ventricular wall thickness. Right ventricular systolic function is normal. Tricuspid regurgitation signal is inadequate for assessing PA pressure. Left Atrium: Left atrial size was not well visualized. Right Atrium: Right atrial size was not well visualized. Pericardium: Trivial pericardial effusion is present. Presence of pericardial fat pad. Mitral Valve: The mitral valve is grossly normal. No evidence of mitral valve regurgitation. No evidence of mitral valve stenosis. Tricuspid Valve: The tricuspid valve is grossly normal. Tricuspid valve regurgitation is not demonstrated. No evidence of tricuspid stenosis. Aortic Valve: The aortic valve is tricuspid. Aortic valve regurgitation is not visualized. No aortic stenosis is present. Pulmonic Valve: The pulmonic valve was grossly normal. Pulmonic valve regurgitation is not visualized. No evidence of pulmonic stenosis. Aorta: The aortic root is normal in size and structure. Venous: The inferior vena cava was not well visualized. IAS/Shunts: The interatrial septum was not well  visualized.  LEFT VENTRICLE PLAX 2D LVOT diam:     2.30 cm LV SV:         80 LV SV Index:   30 LVOT Area:     4.15 cm  AORTIC VALVE LVOT Vmax:   105.00 cm/s LVOT Vmean:  75.000 cm/s LVOT VTI:    0.192 m MITRAL VALVE MV Area (PHT): 7.16 cm     SHUNTS MV Decel Time: 106 msec     Systemic VTI:  0.19 m MV E velocity: 110.00 cm/s  Systemic Diam: 2.30 cm Eleonore Chiquito MD Electronically signed by [place]  O'Neal MD Signature Date/Time: 08/03/2019/3:22:07 PM    Final     Cardiac Studies   Echo 08/03/19 1. Extremely poor windows for TTE. Global LV function is normal.  2. Left ventricular ejection fraction, by estimation, is 60 to 65%. The  left ventricle has normal function. Left ventricular endocardial border  not optimally defined to evaluate regional wall motion. Left ventricular  diastolic function could not be  evaluated.  3. Right ventricular systolic function is normal. The right ventricular  size is normal. Tricuspid regurgitation signal is inadequate for assessing  PA pressure.  4. The mitral valve is grossly normal. No evidence of mitral valve  regurgitation. No evidence of mitral stenosis.  5. The aortic valve is tricuspid. Aortic valve regurgitation is not  visualized. No aortic stenosis is present.   Patient Profile     49 y.o. female with a hx of morbid obesity s/p gastric bypass in 2000, GERD, bipolar disorder and obesity hypoventilationwho is being seen today for the evaluation of new onset atrial fibrillation.   Assessment & Plan    Paroxysmal Afib - started on IV dilt, now at 15 mg/h - Metoprolol 12.5mg  Q8 hours was added. Pressures stable - Echo showed LVEF 60-65%, normal RV function - IV heparin - CHADSVASC at least 1 (female, ?CHF) - Goal Mag>2 and K+>4 - Still in afib with rates around 100bpm.  Plan for TEE/DCCV today   Obesity hypoventilation - CPAP/Bipap per IM - Plan for outpatient follow-up and sleep study  For questions or updates, please contact [facility] Please consult [email or web address] for contact info under        Signed, Caliya Narine David Stall, PA-C  08/05/2019, 9:10 AM

## 2019-08-05 NOTE — Telephone Encounter (Signed)
Patient is still in the hospital today 08/05/19.

## 2019-08-05 NOTE — Anesthesia Preprocedure Evaluation (Addendum)
Anesthesia Evaluation  Patient identified by MRN, date of birth, ID band Patient awake    Reviewed: Allergy & Precautions, NPO status , Patient's Chart, lab work & pertinent test results  Airway Mallampati: II  TM Distance: >3 FB Neck ROM: Full    Dental no notable dental hx.    Pulmonary asthma , former smoker,    Pulmonary exam normal breath sounds clear to auscultation       Cardiovascular Normal cardiovascular exam+ dysrhythmias Atrial Fibrillation  Rhythm:Regular Rate:Normal     Neuro/Psych  Headaches, PSYCHIATRIC DISORDERS Anxiety Depression Bipolar Disorder Schizophrenia    GI/Hepatic Neg liver ROS, GERD  Medicated and Controlled,  Endo/Other  Morbid obesity ([place] )  Renal/GU negative Renal ROS     Musculoskeletal negative musculoskeletal ROS (+)   Abdominal   Peds  Hematology negative hematology ROS (+)   Anesthesia Other Findings A-FIB  Reproductive/Obstetrics                            Anesthesia Physical Anesthesia Plan  ASA: IV  Anesthesia Plan: General   Post-op Pain Management:    Induction: Intravenous  PONV Risk Score and Plan: 3 and Ondansetron, Dexamethasone and Treatment may vary due to age or medical condition  Airway Management Planned: Oral ETT  Additional Equipment:   Intra-op Plan:   Post-operative Plan: Extubation in OR  Informed Consent: I have reviewed the patients History and Physical, chart, labs and discussed the procedure including the risks, benefits and alternatives for the proposed anesthesia with the patient or authorized representative who has indicated his/her understanding and acceptance.     Dental advisory given  Plan Discussed with: CRNA  Anesthesia Plan Comments: (Pregnancy test offered to and declined by patient)       Anesthesia Quick Evaluation

## 2019-08-06 NOTE — Anesthesia Postprocedure Evaluation (Signed)
Anesthesia Post Note  Patient: Alyssa Blanchard  Procedure(s) Performed: TRANSESOPHAGEAL ECHOCARDIOGRAM (TEE) (N/A ) CARDIOVERSION (N/A )     Patient location during evaluation: Endoscopy Anesthesia Type: General Level of consciousness: awake and alert Pain management: pain level controlled Vital Signs Assessment: post-procedure vital signs reviewed and stable Respiratory status: spontaneous breathing, nonlabored ventilation, respiratory function stable and patient connected to nasal cannula oxygen Cardiovascular status: blood pressure returned to baseline and stable Postop Assessment: no apparent nausea or vomiting Anesthetic complications: no    Last Vitals:  Vitals:   08/05/19 1355 08/05/19 1405  BP: (!) 99/58 (!) 116/59  Pulse: 73 73  Resp: 17 16  Temp:    SpO2: 91% 95%    Last Pain:  Vitals:   08/05/19 1405  TempSrc:   PainSc: 0-No pain                 Harol Shabazz P Prentice Sackrider

## 2019-08-09 NOTE — Telephone Encounter (Signed)
TOC call to patient no answer.LMTC. °

## 2019-08-11 NOTE — Telephone Encounter (Signed)
Was told wrong number by the person that answered the phone.

## 2019-08-11 NOTE — Telephone Encounter (Signed)
LMTCB - provided reminder for appointment info

## 2019-08-14 NOTE — Progress Notes (Deleted)
Cardiology Clinic Note   Patient Name: Alyssa Blanchard Date of Encounter: 08/14/2019  Primary Care Provider:  No primary care provider on file. Primary Cardiologist:  Chilton Si, MD  Patient Profile    Alyssa Blanchard 49 year old female presents today for follow-up evaluation of her atrial fibrillation.  Past Medical History    Past Medical History:  Diagnosis Date  . Anxiety   . Asthma   . Depression   . Dysrhythmia    new onset Afib rvr  . GERD (gastroesophageal reflux disease)   . Heel spur    bilat  . History of bronchitis   . History of chicken pox   . History of urinary tract infection   . Migraines   . Plantar fasciitis    bilat  . STD (sexually transmitted disease)    chl hx & hsv 1&2  . Tremors of nervous system   . Urinary incontinence    Past Surgical History:  Procedure Laterality Date  . CARDIOVERSION N/A 08/05/2019   Procedure: CARDIOVERSION;  Surgeon: [name] Deloris Ping, MD;  Location: [facility] ENDOSCOPY;  Service: Cardiovascular;  Laterality: N/A;  . LAPAROSCOPIC GASTRIC SLEEVE RESECTION N/A 11/27/2014   Procedure: LAPAROSCOPIC GASTRIC SLEEVE RESECTION WITH HIATAL HERNIA REPAIR UPPER ENDOSCOPY;  Surgeon: Luretha Murphy, MD;  Location: [facility] ORS;  Service: General;  Laterality: N/A;  . TEE WITHOUT CARDIOVERSION N/A 08/05/2019   Procedure: TRANSESOPHAGEAL ECHOCARDIOGRAM (TEE);  Surgeon: [name] Deloris Ping, MD;  Location: [facility] ENDOSCOPY;  Service: Cardiovascular;  Laterality: N/A;  . TONSILLECTOMY  1978    Allergies  Allergies  Allergen Reactions  . Gabapentin Anaphylaxis  . Topiramate Other (See Comments)    Jaw tightness and chest discomfort   . Imitrex [Sumatriptan] Other (See Comments)    Increased heart rate  . Montelukast Other (See Comments)    Nightmares   . Other     Pt states no narcotics ever!     History of Present Illness    Alyssa Blanchard is a PMH of atrial fibrillation with RVR, hypoventilation associated with obesity syndrome, acute  bronchitis, asthma, GERD, status post laparoscopic sleeve gastrectomy, and bipolar 1.  She was seen and evaluated by Dr. Duke Salvia 08/03/2019 in the ED.  She presented to the emergency department after 3 days of shortness of breath.  She was having difficulty ambulating at home.  She had noted that her heart rate was faster than usual.  She presented to the PCP prior to presenting to the ED and was noted to be in atrial fibrillation.  She was directed to the emergency department.  This is her first cardiac evaluation and heart related issue.  On evaluation she stated she did not get much exercise.  She had gastric bypass in 2000 and had always struggled with her weight.  She denied heavy alcohol intake and did not drink caffeine.  She did take Xopenex for asthma due to albuterol making her heart race.  TSH on evaluation was normal.  Cardiac enzymes negative BNP was elevated at 402.  Chest CTA showed pulmonary edema and no PE.  She received IV diuresis and was started on diltiazem gtt. At the time of evaluation her heart rate continued to be poorly controlled.  She underwent TEE and DCCV on 08/05/2019.  This was successful with 1 200 J shock.  She presents to the clinic today for follow-up evaluation and states***  *** denies chest pain, shortness of breath, lower extremity edema, fatigue, palpitations, melena, hematuria, hemoptysis, diaphoresis, weakness, presyncope,  syncope, orthopnea, and PND.   Home Medications    Prior to Admission medications   Medication Sig Start Date End Date Taking? Authorizing Provider  apixaban (ELIQUIS) 5 MG TABS tablet Take 1 tablet (5 mg total) by mouth 2 (two) times daily. 08/05/19 09/04/19  Harold Hedge, MD  BREO ELLIPTA 200-25 MCG/INH AEPB Inhale 1 puff into the lungs daily. 07/30/19   [provider]  diltiazem (CARDIZEM CD) 240 MG 24 hr capsule Take 1 capsule (240 mg total) by mouth daily. 08/06/19 09/05/19  Harold Hedge, MD  ipratropium (ATROVENT) 0.03 %  nasal spray Place 1 spray into both nostrils daily as needed for rhinitis.  03/31/19   [provider]  levalbuterol Penne Lash HFA) 45 MCG/ACT inhaler Inhale 2 puffs into the lungs every 6 (six) hours as needed for wheezing or shortness of breath.  06/28/19   [provider]  pantoprazole (PROTONIX) 40 MG tablet Take 1 tablet (40 mg total) by mouth 2 (two) times daily. 04/20/19   Johnn Hai, MD    Family History    Family History  Problem Relation Age of Onset  . Diabetes Mother   . Hypertension Mother   . Hyperlipidemia Mother   . Kidney disease Mother   . Cancer Father        pancreatic  . Hypertension Maternal Grandmother   . Diabetes Maternal Grandmother   . Heart failure Maternal Grandmother        CHF  . Heart attack Maternal Grandfather   . Hypertension Maternal Grandfather   . Hypertension Paternal Grandmother   . Alzheimer's disease Paternal Grandmother   . Hypertension Paternal Grandfather   . Cancer Maternal Uncle        melanoma  . Cancer Paternal Uncle        kidney   She indicated that her mother is alive. She indicated that her father is deceased. She indicated that her brother is alive. She indicated that her maternal grandmother is deceased. She indicated that her maternal grandfather is deceased. She indicated that her paternal grandmother is deceased. She indicated that her paternal grandfather is alive. She indicated that the status of her maternal uncle is unknown. She indicated that the status of her paternal uncle is unknown.  Social History    Social History   Socioeconomic History  . Marital status: Single    Spouse name: Not on file  . Number of children: Not on file  . Years of education: 70  . Highest education level: Not on file  Occupational History  . Occupation: Armed forces technical officer: [employer]  Tobacco Use  . Smoking status: Former Smoker    Packs/day: 0.25    Years: 5.00    Pack years: 1.25    Types:  Cigarettes    Quit date: 03/10/2012    Years since quitting: 7.4  . Smokeless tobacco: Never Used  Substance and Sexual Activity  . Alcohol use: Yes    Alcohol/week: 0.0 standard drinks    Comment: 1 a month  . Drug use: Yes    Types: Marijuana, Cocaine  . Sexual activity: Yes    Partners: Male    Birth control/protection: Condom  Other Topics Concern  . Not on file  Social History Narrative   Regular exercise-no   Caffeine Use-yes, 1-2 cups of caffeine daily   Social Determinants of Health   Financial Resource Strain:   . Difficulty of Paying Living Expenses:   Food Insecurity:   .  Worried About Programme researcher, broadcasting/film/video in the Last Year:   . Barista in the Last Year:   Transportation Needs:   . Freight forwarder (Medical):   Marland Kitchen Lack of Transportation (Non-Medical):   Physical Activity:   . Days of Exercise per Week:   . Minutes of Exercise per Session:   Stress:   . Feeling of Stress :   Social Connections:   . Frequency of Communication with Friends and Family:   . Frequency of Social Gatherings with Friends and Family:   . Attends Religious Services:   . Active Member of Clubs or Organizations:   . Attends Banker Meetings:   Marland Kitchen Marital Status:   Intimate Partner Violence:   . Fear of Current or Ex-Partner:   . Emotionally Abused:   Marland Kitchen Physically Abused:   . Sexually Abused:      Review of Systems    General:  No chills, fever, night sweats or weight changes.  Cardiovascular:  No chest pain, dyspnea on exertion, edema, orthopnea, palpitations, paroxysmal nocturnal dyspnea. Dermatological: No rash, lesions/masses Respiratory: No cough, dyspnea Urologic: No hematuria, dysuria Abdominal:   No nausea, vomiting, diarrhea, bright red blood per rectum, melena, or hematemesis Neurologic:  No visual changes, wkns, changes in mental status. All other systems reviewed and are otherwise negative except as noted above.  Physical Exam    VS:  There  were no vitals taken for this visit. , BMI There is no height or weight on file to calculate BMI. GEN: Well nourished, well developed, in no acute distress. HEENT: normal. Neck: Supple, no JVD, carotid bruits, or masses. Cardiac: RRR, no murmurs, rubs, or gallops. No clubbing, cyanosis, edema.  Radials/DP/PT 2+ and equal bilaterally.  Respiratory:  Respirations regular and unlabored, clear to auscultation bilaterally. GI: Soft, nontender, nondistended, BS + x 4. MS: no deformity or atrophy. Skin: warm and dry, no rash. Neuro:  Strength and sensation are intact. Psych: Normal affect.  Accessory Clinical Findings    ECG personally reviewed by me today- *** - No acute changes  EKG  DG Chest 2 View  Result Date: 08/02/2019 CLINICAL DATA:  Shortness of breath and cough for 1 week. EXAM: CHEST - 2 VIEW COMPARISON:  03/17/2019 FINDINGS: The cardiac silhouette is normal in size and configuration. No mediastinal or hilar masses. No evidence of adenopathy. Clear lungs.  No pleural effusion or pneumothorax. Skeletal structures are intact. IMPRESSION: No active cardiopulmonary disease. Electronically Signed   By: Amie Portland M.D.   On: 08/02/2019 16:15   CT Angio Chest PE W/Cm &/Or Wo Cm  Result Date: 08/02/2019 CLINICAL DATA:  Asthma exacerbation.  Shortness of breath. EXAM: CT ANGIOGRAPHY CHEST WITH CONTRAST TECHNIQUE: Multidetector CT imaging of the chest was performed using the standard protocol during bolus administration of intravenous contrast. Multiplanar CT image reconstructions and MIPs were obtained to evaluate the vascular anatomy. CONTRAST:  OMNIPAQUE IOHEXOL 350 MG/ML SOLN COMPARISON:  None. FINDINGS: Cardiovascular: Borderline heart size for age. No pericardial effusion. The aorta is normal in caliber. No dissection. No atherosclerotic calcifications. No definite coronary artery calcifications. The pulmonary arterial tree is fairly well opacified but study limited by significant  breathing motion artifact. No large central pulmonary emboli are identified. Mediastinum/Nodes: Borderline enlarged mediastinal and hilar lymph nodes could be inflammatory/reactive secondary to the lung findings. The esophagus is grossly normal. The thyroid gland is grossly normal. Lungs/Pleura: Examination limited by breathing motion artifact but there is a diffuse  hazy interstitial and airspace process in the lungs with a somewhat perihilar predominant pattern which could suggest pulmonary edema or interstitial pneumonia. Very small pleural effusions. Upper Abdomen: Severe and diffuse fatty infiltration of the liver. No hepatic lesions. The gallbladder is grossly normal. Surgical changes involving the stomach likely from prior gastric stapling procedure. No complicating features. Musculoskeletal: No breast masses, supraclavicular or axillary adenopathy. The bony thorax is intact. Review of the MIP images confirms the above findings. IMPRESSION: 1. Limited examination due to breathing motion artifact. 2. No large central pulmonary emboli are identified. 3. Normal thoracic aorta. 4. Diffuse hazy interstitial and airspace process in the lungs with a somewhat perihilar predominant pattern which could suggest pulmonary edema or interstitial pneumonia. 5. Very small bilateral pleural effusions. 6. Severe and diffuse fatty infiltration of the liver. Electronically Signed   By: Rudie Meyer M.D.   On: 08/02/2019 20:02   Assessment & Plan   1.  Paroxysmal atrial fibrillation-EKG today shows*** She underwent TEE and DCCV on 08/05/2019.  This was successful with 1 200 J shock. Continue apixaban, diltiazem Avoid triggers caffeine, chocolate, EtOH etc. Heart healthy low-sodium diet-salty 6 given Increase physical activity as tolerated  Obesity hypoventilation-on CPAP Continue CPAP use Continue weight loss-gastrectomy sleeve and 2000 Increase physical activity as tolerated Heart healthy low-sodium  diet  Disposition: Follow-up with Dr. Duke Salvia in 3 months.    Thomasene Ripple. Deontae Robson NP-C    08/14/2019, 1:04 PM [facility] [address] Office [phone] Fax [phone]

## 2019-08-15 ENCOUNTER — Other Ambulatory Visit: Payer: Self-pay

## 2019-08-15 ENCOUNTER — Ambulatory Visit: Payer: BLUE CROSS/BLUE SHIELD | Admitting: General Practice

## 2019-09-25 DIAGNOSIS — I5042 Chronic combined systolic (congestive) and diastolic (congestive) heart failure: Secondary | ICD-10-CM | POA: Diagnosis present

## 2019-09-25 DIAGNOSIS — I5033 Acute on chronic diastolic (congestive) heart failure: Secondary | ICD-10-CM | POA: Diagnosis present

## 2020-01-26 ENCOUNTER — Emergency Department (HOSPITAL_COMMUNITY): Payer: BLUE CROSS/BLUE SHIELD

## 2020-01-26 ENCOUNTER — Other Ambulatory Visit: Payer: Self-pay

## 2020-01-26 ENCOUNTER — Encounter (HOSPITAL_COMMUNITY): Payer: Self-pay

## 2020-01-26 ENCOUNTER — Emergency Department (HOSPITAL_COMMUNITY)
Admission: EM | Admit: 2020-01-26 | Discharge: 2020-01-30 | Disposition: A | Discharge status: Home / Self Care | Payer: BLUE CROSS/BLUE SHIELD | Source: Home / Self Care | Attending: Emergency Medicine | Admitting: Emergency Medicine

## 2020-01-26 DIAGNOSIS — Z87891 Personal history of nicotine dependence: Secondary | ICD-10-CM | POA: Insufficient documentation

## 2020-01-26 DIAGNOSIS — Z7901 Long term (current) use of anticoagulants: Secondary | ICD-10-CM | POA: Insufficient documentation

## 2020-01-26 DIAGNOSIS — Z79899 Other long term (current) drug therapy: Secondary | ICD-10-CM | POA: Insufficient documentation

## 2020-01-26 DIAGNOSIS — F429 Obsessive-compulsive disorder, unspecified: Secondary | ICD-10-CM | POA: Insufficient documentation

## 2020-01-26 DIAGNOSIS — J4540 Moderate persistent asthma, uncomplicated: Secondary | ICD-10-CM | POA: Insufficient documentation

## 2020-01-26 DIAGNOSIS — R002 Palpitations: Secondary | ICD-10-CM

## 2020-01-26 DIAGNOSIS — R0602 Shortness of breath: Secondary | ICD-10-CM | POA: Insufficient documentation

## 2020-01-26 DIAGNOSIS — F319 Bipolar disorder, unspecified: Secondary | ICD-10-CM | POA: Diagnosis not present

## 2020-01-26 DIAGNOSIS — I4891 Unspecified atrial fibrillation: Secondary | ICD-10-CM | POA: Insufficient documentation

## 2020-01-26 DIAGNOSIS — F411 Generalized anxiety disorder: Secondary | ICD-10-CM | POA: Insufficient documentation

## 2020-01-26 DIAGNOSIS — Z20822 Contact with and (suspected) exposure to covid-19: Secondary | ICD-10-CM | POA: Insufficient documentation

## 2020-01-26 LAB — BASIC METABOLIC PANEL
Anion gap: 16 — ABNORMAL HIGH (ref 5–15)
BUN: 15 mg/dL (ref 6–20)
CO2: 18 mmol/L — ABNORMAL LOW (ref 22–32)
Calcium: 9.2 mg/dL (ref 8.9–10.3)
Chloride: 104 mmol/L (ref 98–111)
Creatinine, Ser: 1.1 mg/dL — ABNORMAL HIGH (ref 0.44–1.00)
GFR, Estimated: >60 mL/min (ref >60)
Glucose, Bld: 125 mg/dL — ABNORMAL HIGH (ref 70–99)
Potassium: 4.2 mmol/L (ref 3.5–5.1)
Sodium: 138 mmol/L (ref 135–145)

## 2020-01-26 LAB — TROPONIN I (HIGH SENSITIVITY)
Troponin I (High Sensitivity): 5 ng/L (ref <18)
Troponin I (High Sensitivity): 6 ng/L (ref <18)

## 2020-01-26 LAB — CBC
HCT: 37.9 % (ref 36.0–46.0)
Hemoglobin: 11.6 g/dL — ABNORMAL LOW (ref 12.0–15.0)
MCH: 25.4 pg — ABNORMAL LOW (ref 26.0–34.0)
MCHC: 30.6 g/dL (ref 30.0–36.0)
MCV: 82.9 fL (ref 80.0–100.0)
Platelets: 282 10*3/uL (ref 150–400)
RBC: 4.57 MIL/uL (ref 3.87–5.11)
RDW: 17.2 % — ABNORMAL HIGH (ref 11.5–15.5)
WBC: 9.8 10*3/uL (ref 4.0–10.5)
nRBC: 0 % (ref 0.0–0.2)

## 2020-01-26 LAB — D-DIMER, QUANTITATIVE: D-Dimer, Quant: 0.55 ug/mL-FEU — ABNORMAL HIGH (ref 0.00–0.50)

## 2020-01-26 MED ORDER — LACTATED RINGERS IV BOLUS
1000.0000 mL | Freq: Once | INTRAVENOUS | Status: AC
  Administered 2020-01-26: 1000 mL via INTRAVENOUS

## 2020-01-26 MED ORDER — IOHEXOL 350 MG/ML SOLN
100.0000 mL | Freq: Once | INTRAVENOUS | Status: AC | PRN
  Administered 2020-01-26: 100 mL via INTRAVENOUS

## 2020-01-26 MED ORDER — HYDROXYZINE HCL 25 MG PO TABS
25.0000 mg | ORAL_TABLET | Freq: Once | ORAL | Status: AC
  Administered 2020-01-26: 25 mg via ORAL
  Filled 2020-01-26: qty 1

## 2020-01-26 MED ORDER — LORAZEPAM 1 MG PO TABS
1.0000 mg | ORAL_TABLET | Freq: Once | ORAL | Status: AC
  Administered 2020-01-26: 1 mg via ORAL
  Filled 2020-01-26: qty 1

## 2020-01-26 NOTE — ED Triage Notes (Signed)
Pt arrived via EMS from home. Pt called out for palpitations. Pt states she has anxiety and her medications became unavailable today. Pt denies cp but reports "achiness in the right arm."

## 2020-01-26 NOTE — ED Notes (Signed)
Pt sleeping in stretcher, NADN, easily arousable when resting HR is in lower 90s, side rails up, call bell in reach.

## 2020-01-26 NOTE — ED Notes (Signed)
Pocket knife placed with security

## 2020-01-26 NOTE — ED Notes (Signed)
Pt having another brief episode of sinus tach 140, quickly resolving within secodns, pt reporting anxiety and clutching chest, dr long notified.  Pt continues to wait for tts at this time.

## 2020-01-26 NOTE — ED Provider Notes (Addendum)
Blood pressure (!) 145/89, pulse (!) 25, temperature 98.6 F (37 C), temperature source Oral, resp. rate 20, height 5\' 6"  (1.676 m), weight (!) 187.8 kg, SpO2 94 %.  Assuming care from Dr. .  In short, Alyssa Blanchard is a 49 y.o. female with a chief complaint of Palpitations .  Refer to the original H&P for additional details.  The current plan of care is to f/u with d-dimer and delta troponin.  04:50 PM  D-dimer is mildly elevated at 0.55.  Patient is anticoagulated but given her persistent tachycardia and palpitation symptoms plan for CTA of the chest.  I discussed with the patient who is in agreement with the plan.   07:32 PM  Delta troponins negative.  Patient's heart rate has improved.  CTA of the chest while not ideal bolus timing does not show any large, central PE.  Plan for discharge with close PCP follow-up as well as follow-up with her cardiologist.  Discussed with patient who is in agreement with plan at discharge.   08:37 PM  At the time of discharge the patient again had an episode of acute agitation which is experienced in the prior shift.  She seemed very anxious and was difficult to redirect.  She denies any suicidal or homicidal ideation but question some acute psychiatric component to the symptoms.  I will asked TTS to evaluate the patient.  Safety and discharge planning.     54, MD 01/26/20 1932    01/28/20, MD 01/26/20 2037

## 2020-01-26 NOTE — ED Notes (Signed)
Psych packet completed, pts belongings inventoried and

## 2020-01-26 NOTE — ED Provider Notes (Signed)
[facility] EMERGENCY DEPARTMENT Provider Note   CSN: [id number] Arrival date & time: 01/26/20  1309     History Chief Complaint  Patient presents with  . Palpitations    Alyssa Blanchard is a 49 y.o. female.  Patient is a 49 year old female with a history of atrial fibrillation on Eliquis, bipolar disease, anxiety, gastric sleeve and morbid obesity who is presenting today due to chest pain.  Patient reports that she takes hydroxyzine for anxiety usually up to 3 times a day and reports today she started to feel anxious and then started feeling her heart racing and felt a chest pain which she states was unusual.  She could not find the hydroxyzine and her symptoms continue to escalate she reported she had to get out of the house.  She reported it as a sharp pain that started in the center of her chest and started spreading out to the right.  She was not given any medication but since being here she has started to calm down and reports she feels much better now.  She has no pain at this time.  She denies any recent infectious symptoms has been eating and drinking normally and reports that she is overall felt normal.  She has been taking all of her medication as prescribed and had all of her medications except the hydroxyzine and Lasix today.  She has no known heart history.  She denies any recent abdominal pain or lower extremity swelling.  The history is provided by the patient.  Palpitations Palpitations quality:  Fast Onset quality:  Sudden Timing:  Constant Progression:  Resolved Chronicity:  Recurrent Context: anxiety   Associated symptoms: chest pain and shortness of breath   Associated symptoms: no nausea, no near-syncope, no vomiting and no weakness        Past Medical History:  Diagnosis Date  . Anxiety   . Asthma   . Depression   . Dysrhythmia    new onset Afib rvr  . GERD (gastroesophageal reflux disease)   . Heel spur    bilat  . History of  bronchitis   . History of chicken pox   . History of urinary tract infection   . Migraines   . Plantar fasciitis    bilat  . STD (sexually transmitted disease)    chl hx & hsv 1&2  . Tremors of nervous system   . Urinary incontinence     Patient Active Problem List   Diagnosis Date Noted  . Acute pulmonary edema (HCC)   . Atrial fibrillation with RVR (HCC) 08/02/2019  . Bipolar I disorder, most recent episode (or current) manic (HCC) 04/15/2019  . Psychosis (HCC) 04/14/2019  . Not well controlled moderate persistent asthma 02/02/2018  . Chronic rhinitis 02/02/2018  . Status asthmaticus 11/29/2017  . Acute respiratory failure with hypoxia (HCC) 11/29/2017  . Gastroesophageal reflux disease 09/01/2017  . Cough 12/31/2016  . Dysuria 07/28/2016  . Clinical infection 07/28/2016  . History of asthma 04/17/2016  . Acute bronchitis 04/17/2016  . 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 Migraine without aura and without status migrainosus, not intractable 10/10/2014  . Migraine with aura and without status migrainosus, not intractable 08/29/2014  . 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 classified elsewhere 08/29/2014  . 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 Snorings 07/10/2014  . Sleep related headaches 07/10/2014  . Nocturia more than twice per night 07/10/2014  . Preventative health care 07/30/2012  . Morbid obesity (HCC) 07/30/2012  Past Surgical History:  Procedure Laterality Date  . CARDIOVERSION N/A 08/05/2019   Procedure: CARDIOVERSION;  Surgeon: Elease HashimotoNahser, Deloris PingPhilip J, MD;  [facility];  Service: Cardiovascular;  Laterality: N/A;  . LAPAROSCOPIC GASTRIC SLEEVE RESECTION N/A 11/27/2014   Procedure: LAPAROSCOPIC GASTRIC SLEEVE RESECTION WITH HIATAL HERNIA REPAIR UPPER ENDOSCOPY;  Surgeon: Luretha MurphyMatthew Martin, MD;  Location: [facility] ORS;  Service: General;  Laterality: N/A;  . TEE WITHOUT CARDIOVERSION N/A 08/05/2019   Procedure: TRANSESOPHAGEAL  ECHOCARDIOGRAM (TEE);  Surgeon: Elease HashimotoNahser, Deloris PingPhilip J, MD;  [facility];  Service: Cardiovascular;  Laterality: N/A;  . TONSILLECTOMY  1978     OB History    Gravida  2   Para      Term      Preterm      AB  2   Living  0     SAB      TAB  2   Ectopic      Multiple      Live Births              Family History  Problem Relation Age of Onset  . Diabetes Mother   . Hypertension Mother   . Hyperlipidemia Mother   . Kidney disease Mother   . Cancer Father        pancreatic  . Hypertension Maternal Grandmother   . Diabetes Maternal Grandmother   . Heart failure Maternal Grandmother        CHF  . Heart attack Maternal Grandfather   . Hypertension Maternal Grandfather   . Hypertension Paternal Grandmother   . Alzheimer's disease Paternal Grandmother   . Hypertension Paternal Grandfather   . Cancer Maternal Uncle        melanoma  . Cancer Paternal Uncle        kidney    Social History   Tobacco Use  . Smoking status: Former Smoker    Packs/day: 0.25    Years: 5.00    Pack years: 1.25    Types: Cigarettes    Quit date: 03/10/2012    Years since quitting: 7.8  . Smokeless tobacco: Never Used  Vaping Use  . Vaping Use: Never used  Substance Use Topics  . Alcohol use: Yes    Alcohol/week: 0.0 standard drinks    Comment: 1 a month  . Drug use: Yes    Types: Marijuana, Cocaine    Home Medications Prior to Admission medications   Medication Sig Start Date End Date Taking? Authorizing Provider  apixaban (ELIQUIS) 5 MG TABS tablet Take 1 tablet (5 mg total) by mouth 2 (two) times daily. 08/05/19 09/04/19  Jae DireSegal, Jared E, MD  BREO ELLIPTA 200-25 MCG/INH AEPB Inhale 1 puff into the lungs daily. 07/30/19   [provider]  diltiazem (CARDIZEM CD) 240 MG 24 hr capsule Take 1 capsule (240 mg total) by mouth daily. 08/06/19 09/05/19  Jae DireSegal, Jared E, MD  ipratropium (ATROVENT) 0.03 % nasal spray Place 1 spray into both nostrils daily as needed for  rhinitis.  03/31/19   [provider]  levalbuterol Pauline Aus(XOPENEX HFA) 45 MCG/ACT inhaler Inhale 2 puffs into the lungs every 6 (six) hours as needed for wheezing or shortness of breath.  06/28/19   [provider]  pantoprazole (PROTONIX) 40 MG tablet Take 1 tablet (40 mg total) by mouth 2 (two) times daily. 04/20/19   Malvin JohnsFarah, Brian, MD    Allergies    Gabapentin, Topiramate, Imitrex [sumatriptan], Montelukast, and Other  Review of Systems  Review of Systems  Respiratory: Positive for shortness of breath.   Cardiovascular: Positive for chest pain and palpitations. Negative for near-syncope.  Gastrointestinal: Negative for nausea and vomiting.  Neurological: Negative for weakness.  All other systems reviewed and are negative.   Physical Exam Updated Vital Signs BP (!) 153/78   Pulse (!) 104   Temp 98.6 F (37 C) (Oral)   Resp (!) 21   Ht 5\' 6"  (1.676 m)   Wt (!) 187.8 kg   SpO2 100%   BMI 66.82 kg/m   Physical Exam Vitals and nursing note reviewed.  Constitutional:      General: She is not in acute distress.    Appearance: She is well-developed. She is obese.  HENT:     Head: Normocephalic and atraumatic.     Mouth/Throat:     Mouth: Mucous membranes are moist.  Eyes:     Pupils: Pupils are equal, round, and reactive to light.  Cardiovascular:     Rate and Rhythm: Regular rhythm. Tachycardia present.     Heart sounds: Normal heart sounds. No murmur heard.  No friction rub.  Pulmonary:     Effort: Pulmonary effort is normal.     Breath sounds: Normal breath sounds. No wheezing or rales.  Abdominal:     General: Bowel sounds are normal. There is no distension.     Palpations: Abdomen is soft.     Tenderness: There is no abdominal tenderness. There is no guarding or rebound.  Musculoskeletal:        General: No tenderness. Normal range of motion.     Cervical back: Normal range of motion and neck supple.     Right lower leg: No edema.     Left lower leg:  No edema.     Comments: No edema  Skin:    General: Skin is warm and dry.     Findings: No rash.  Neurological:     General: No focal deficit present.     Mental Status: She is alert and oriented to person, place, and time. Mental status is at baseline.     Cranial Nerves: No cranial nerve deficit.  Psychiatric:        Mood and Affect: Mood normal.        Behavior: Behavior normal.        Thought Content: Thought content normal.     ED Results / Procedures / Treatments   Labs (all labs ordered are listed, but only abnormal results are displayed) Labs Reviewed  BASIC METABOLIC PANEL - Abnormal; Notable for the following components:      Result Value   CO2 18 (*)    Glucose, Bld 125 (*)    Creatinine, Ser 1.10 (*)    Anion gap 16 (*)    All other components within normal limits  CBC - Abnormal; Notable for the following components:   Hemoglobin 11.6 (*)    MCH 25.4 (*)    RDW 17.2 (*)    All other components within normal limits  TROPONIN I (HIGH SENSITIVITY)    EKG EKG Interpretation  Date/Time:  Thursday January 26 2020 13:16:47 EST Ventricular Rate:  130 PR Interval:    QRS Duration: 89 QT Interval:  314 QTC Calculation: 462 R Axis:   14 Text Interpretation: new Sinus tachycardia Low voltage, precordial leads Borderline T wave abnormalities Confirmed by [id number] (Gwyneth Sprout) on 01/26/2020 1:19:40 PM   Radiology No results found.  Procedures Procedures (including critical care time)  Medications Ordered in ED Medications  hydrOXYzine (ATARAX/VISTARIL) tablet 25 mg (25 mg Oral Given 01/26/20 1404)    ED Course  I have reviewed the triage vital signs and the nursing notes.  Pertinent labs & imaging results that were available during my care of the patient were reviewed by me and considered in my medical decision making (see chart for details).    MDM Rules/Calculators/A&P                          Patient presenting today with complaint of  nonspecific chest pain and palpitations.  Patient does have a history of atrial fibrillation but upon arrival here she initially had a tachycardia which appeared to be sinus tachycardia 130 and without intervention her heart rate is now 100 and she reports feeling much better.  At this time she does not appear anxious.  She denies any substance use that would cause sinus tachycardia.  She is not in atrial fibrillation at this time.  She has a history of severe anxiety and suspect today's episode was related to a panic attack.  However because she noted a different type of chest pain than baseline we will do a troponin for further evaluation.  CBC and BMP without significant findings except for a low CO2 which is most likely from hyperventilation.  Troponin and chest x-ray are pending.  Patient is otherwise well-appearing at this time but does request a dose of her hydroxyzine which she was given. Final Clinical Impression(s) / ED Diagnoses Final diagnoses:  None    Rx / DC Orders ED Discharge Orders    None       Gwyneth Sprout, MD 01/26/20 1551

## 2020-01-26 NOTE — ED Notes (Signed)
Pt ambulatory to room reporting continued anxiety. On monitor, sinus tach 108 HR. Pt watching tv now reporting shes willing to stay for psych consult. NADN.

## 2020-01-26 NOTE — ED Notes (Addendum)
Pt crying and wandering the hall asking for scissors to cut up some paper. MD notified, new orders received. Pt seen putting a screw in her mouth intermittently.

## 2020-01-26 NOTE — ED Notes (Signed)
Pt standing, hovering over ccm in room , changing setting on monitor. Pt educated x2 to sit and rest in stretcher, pt continues to get out of bed and change monitor settings.

## 2020-01-26 NOTE — ED Notes (Signed)
1600 Pt found sitting straight up in the bed yelling out incomprehensible speech eyes looking straight ahead. This episode lasted about 30 seconds and was witnessed by the provider. Pt then sat back and stated "im sorry that was just one of my spells."  1645 Pt found wandering standing in front of the trauma bay full dressed, dragging the IV pole with her. RN explained to pt that she could not wander the halls in the the ER due to safety concerns. Pt asked in response "where is the TBI unit."?   1730 Pt pulled out her IV stating the reason was because she was feeling inpatient.  1740 Pt keeps taking off her leads and ask to have them left off so she has freedom to roam around the room.

## 2020-01-27 LAB — RESPIRATORY PANEL BY RT PCR (FLU A&B, COVID)
Influenza A by PCR: NEGATIVE
Influenza B by PCR: NEGATIVE
SARS Coronavirus 2 by RT PCR: NEGATIVE

## 2020-01-27 MED ORDER — FUROSEMIDE 20 MG PO TABS
20.0000 mg | ORAL_TABLET | Freq: Every day | ORAL | Status: DC
  Administered 2020-01-27 – 2020-01-30 (×4): 20 mg via ORAL
  Filled 2020-01-27 (×4): qty 1

## 2020-01-27 MED ORDER — HALOPERIDOL LACTATE 5 MG/ML IJ SOLN
5.0000 mg | Freq: Once | INTRAMUSCULAR | Status: AC
  Administered 2020-01-27: 5 mg via INTRAMUSCULAR
  Filled 2020-01-27 (×2): qty 1

## 2020-01-27 MED ORDER — PANTOPRAZOLE SODIUM 40 MG PO TBEC
40.0000 mg | DELAYED_RELEASE_TABLET | Freq: Two times a day (BID) | ORAL | Status: DC
  Administered 2020-01-27 – 2020-01-30 (×7): 40 mg via ORAL
  Filled 2020-01-27 (×7): qty 1

## 2020-01-27 MED ORDER — ASPIRIN EC 81 MG PO TBEC
81.0000 mg | DELAYED_RELEASE_TABLET | Freq: Every day | ORAL | Status: DC
  Administered 2020-01-27 – 2020-01-30 (×4): 81 mg via ORAL
  Filled 2020-01-27 (×4): qty 1

## 2020-01-27 MED ORDER — HYDROXYZINE HCL 50 MG PO TABS
50.0000 mg | ORAL_TABLET | Freq: Two times a day (BID) | ORAL | Status: DC
  Administered 2020-01-27 – 2020-01-29 (×6): 50 mg via ORAL
  Filled 2020-01-27 (×4): qty 1
  Filled 2020-01-27: qty 2
  Filled 2020-01-27 (×2): qty 1

## 2020-01-27 MED ORDER — LORAZEPAM 1 MG PO TABS
1.0000 mg | ORAL_TABLET | Freq: Once | ORAL | Status: AC
  Administered 2020-01-27: 1 mg via ORAL
  Filled 2020-01-27: qty 1

## 2020-01-27 MED ORDER — DILTIAZEM HCL ER COATED BEADS 120 MG PO CP24
240.0000 mg | ORAL_CAPSULE | Freq: Every day | ORAL | Status: DC
  Administered 2020-01-27 – 2020-01-30 (×4): 240 mg via ORAL
  Filled 2020-01-27: qty 1
  Filled 2020-01-27 (×3): qty 2

## 2020-01-27 MED ORDER — LEVALBUTEROL TARTRATE 45 MCG/ACT IN AERO
2.0000 | INHALATION_SPRAY | Freq: Four times a day (QID) | RESPIRATORY_TRACT | Status: DC | PRN
  Administered 2020-01-28 (×2): 2 via RESPIRATORY_TRACT
  Filled 2020-01-27: qty 15

## 2020-01-27 NOTE — ED Notes (Signed)
pysch packet completed, pts belongings in locker room, sitter at bedside.

## 2020-01-27 NOTE — ED Notes (Signed)
Pt states she is feeling increasingly anxious. MD Trifan notified of patient condition. Anxiolytic ordered. Will continue to monitor.

## 2020-01-27 NOTE — ED Notes (Signed)
Pt resting in bed. NADN 

## 2020-01-27 NOTE — ED Notes (Signed)
Pt crying at bedside reporting "theres too much clutter in here I cant stand it". Pt provided gingerale and tissues at this time.

## 2020-01-27 NOTE — ED Notes (Signed)
Pt organizing cabinets in room, reports she is "super OCD".

## 2020-01-27 NOTE — ED Notes (Signed)
Tss computer set up at bedside

## 2020-01-27 NOTE — ED Notes (Signed)
Pt resting in chair. Cleaning equipment in room. NADN

## 2020-01-27 NOTE — ED Notes (Signed)
Pt asleep in chair. Chest rise and fall observed. Will continue to monitor. Sitter at bedside.

## 2020-01-27 NOTE — ED Notes (Signed)
Pt reporting epitaxis after covid swab. Bleeding controlled at this time.

## 2020-01-27 NOTE — ED Notes (Signed)
Pt refusing haldol at this time, reporting she doesn't want to go to sleep.

## 2020-01-27 NOTE — ED Notes (Signed)
Pt to shower with sitter to accompany.

## 2020-01-27 NOTE — Progress Notes (Signed)
Per [facility] admissions coordinator, there are no beds available today. [facility] will review pt again tomorrow for placement.    Wells Guiles, MSW, LCSW, LCAS Clinical Social Worker II Disposition CSW [phone]

## 2020-01-27 NOTE — ED Notes (Signed)
Patient has been IVC'd 

## 2020-01-27 NOTE — ED Provider Notes (Signed)
Patient becoming agitated and anxious.  She is noted to be wandering in the hallway, knocking on walls and doors.  She is difficult to redirect.  Will require sedation.   Gilda Crease, MD 01/27/20 [phone]

## 2020-01-27 NOTE — ED Notes (Signed)
Pt stated to this nurse, " You have 3 minutes and then I'm running out of here." Notified Dr. Blinda Leatherwood and MD advised that he was awaiting the TTS recommendation. Called TTS and the recommendation is for inpatient psychiatric care. Advised Dr. Blinda Leatherwood of recommendation and MD advised that pt will be IVC'd. Pt attempted to leave, pt advised of recommendation, and MD was completing an IVC. Pt continued to walk down hallway, notified Elliot Gurney, Consulting civil engineer, and security. This nurse followed patient, pt pulled the fire alarm and then exited out an emergency exit. Security stopped patient, pt continued to attempt to leave, pt then proceeded to attempt to Chartered certified accountant. Pt was assisted to wheelchair and assisted back to room.

## 2020-01-27 NOTE — ED Notes (Addendum)
Pt acting in erratic behavior knocking on room walls and attempting to wonder in hallways. Pt redirected to room at this time.
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
## 2020-01-27 NOTE — ED Notes (Signed)
Pt given a cup of ginger ale.

## 2020-01-28 MED ORDER — LORAZEPAM 1 MG PO TABS
1.0000 mg | ORAL_TABLET | Freq: Once | ORAL | Status: AC
  Administered 2020-01-28: 1 mg via ORAL
  Filled 2020-01-28: qty 1

## 2020-01-28 MED ORDER — ONDANSETRON 4 MG PO TBDP
8.0000 mg | ORAL_TABLET | Freq: Three times a day (TID) | ORAL | Status: DC | PRN
  Administered 2020-01-30: 8 mg via ORAL
  Filled 2020-01-28: qty 2

## 2020-01-28 MED ORDER — DIPHENHYDRAMINE HCL 25 MG PO CAPS
50.0000 mg | ORAL_CAPSULE | Freq: Once | ORAL | Status: AC
  Administered 2020-01-28: 50 mg via ORAL
  Filled 2020-01-28: qty 2

## 2020-01-28 NOTE — ED Notes (Signed)
Patient was given a Cup of water. 

## 2020-01-28 NOTE — ED Notes (Signed)
Pt throwing up at bedside into spit bag

## 2020-01-28 NOTE — ED Notes (Signed)
Dinner Trays ordered 1720 -ms 

## 2020-01-28 NOTE — ED Notes (Signed)
Pt stating that she believes she is having a reaction to her medications, feeling a "numbness that starts in my feet and goes up." Says that she only experiences this when taking gabapentin. Says "It feels like someone gave me gabapentin." This RN explained to pt that she only received her prescribed hydroxyzine and protonix. Pt sitting on edge on bed, beginning to hyperventilate.

## 2020-01-28 NOTE — ED Notes (Signed)
Patient use phone for the 1st time today.

## 2020-01-28 NOTE — ED Notes (Signed)
Patient ambulated to bathroom independently. Water provided per patient request.

## 2020-01-28 NOTE — ED Notes (Signed)
Patient was given [place] Cloud, Apple Sauce and Ginger Ale, during Snack time.

## 2020-01-28 NOTE — ED Notes (Signed)
Pt pacing and continuously washing hands with soap that pt claims she is having a reaction to. Pt then got into bed and began shaking head back and forth very fast as if she was trying to shake something off her face.  Pt on verge of tears.

## 2020-01-28 NOTE — ED Notes (Signed)
Patient requested toiletries for bathing. Toiletries provided, pt did not want to use shower and instead cleaned self in restroom sink. Currently no scrubs available in patient's size, fresh gown provided.

## 2020-01-28 NOTE — ED Notes (Signed)
Breakfast Ordered 

## 2020-01-28 NOTE — ED Notes (Signed)
Pt states "my hands are itching" while hands appear shaky.

## 2020-01-29 MED ORDER — NYSTATIN 100000 UNIT/GM EX POWD
Freq: Two times a day (BID) | CUTANEOUS | Status: DC
  Administered 2020-01-29: 1 via TOPICAL
  Filled 2020-01-29: qty 15

## 2020-01-29 MED ORDER — ARIPIPRAZOLE 5 MG PO TABS
5.0000 mg | ORAL_TABLET | Freq: Every day | ORAL | Status: DC
  Filled 2020-01-29 (×2): qty 1

## 2020-01-29 NOTE — ED Notes (Signed)
Dinner tray ordered.
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
## 2020-01-29 NOTE — ED Notes (Signed)
Tele psych machine to bedside for pt assessment  

## 2020-01-29 NOTE — ED Notes (Signed)
Social work to bedside.

## 2020-01-29 NOTE — ED Notes (Signed)
Patient was given a Snack and drink. 

## 2020-01-29 NOTE — Progress Notes (Signed)
CSW contacted by RN to speak with patient about her concerns. Patient reports she has a dog named [place] and she is worried about how they are doing. CSW notes patient would be open for one of her neighbors Larrie Kass to be contacted to check in on [place] but does not have her phone number. CSW was unsuccessful in finding contact information for Larrie Kass. Patient reported she was near [facility] but was unclear what patient was referencing,

## 2020-01-29 NOTE — ED Notes (Signed)
Pt taking a shower 

## 2020-01-29 NOTE — ED Notes (Signed)
Lunch tray ordered at 1010 

## 2020-01-29 NOTE — ED Notes (Signed)
Pt continues to request that toilet seat be wiped and cleaned before each time that she uses bathroom.

## 2020-01-29 NOTE — ED Notes (Signed)
Pt refused ordered Abilify medications, reports she can not get things done or think when she is on this medication.

## 2020-01-29 NOTE — BH Assessment (Addendum)
01/29/20 Reassessment:    Upon review of notes from patient's initial arrival: "Patient was very anxious with a flat and depressed affect". "She states that she called EMS due to chest pain and she states she was extremely anxious". Additional documentation noted on the day of her arrival: She was noted to be wandering in the hallway, knocking on walls and doors". Pt denied SI, HI, AVH upon arrival.   Clinician assessed patient on this day. She states, "Oh my, I feel much better". She informs me that she lives with her mother. However, decided to go to the [employer] days. States that she likes to go to the [place] because it gives her a chance to get away and relax. States that she had only been to the resort x1 day and started to feel depressed and anxious. Patients describes her depressive symptoms as isolation, hopelessness, and worthlessness. Although she acknowledged these depressive symptoms upon arrival she denies those symptoms in today's assessment. Again stating, "I feel so much better today". She states that her anxiety can become severe and this was her situation upon arrival to the ED.  States, "I felt like my chest was tightening up and I was having a heart attack". Patient called EMS to bring her to the Emergency Department. Today, she reports feeling calm and has no symptoms of anxiety. Patient observed to be calm and cooperative. No signs of anxiety or depression. States that she didn't have a specific trigger or stressor. However, admits that she often "over thinks" and "I get myself all worked up for nothing". Patient sates that she has been sleeping well in the Emergency Department. States that her appetite has been good and she is eating 3 meals a day.  She denies current and recent suicidal ideations. She last had suicidal ideations and made a suicide attempt by overdose, February 2021.  Patient denies self mutilating behaviors. Denies HI and AVH's. Denies alcohol and drug use.    Patient was calm and cooperative. Her affect was appropriate. Speech was normal. She was not restless or displayed any signs of anxiety or manic. Insight and judgement were good. Impulse control is good. Thought processes were intact. Memory is recent and remote intact. She does not appear to be responding to internal stimuli or display any signs of delusional thought processes's.   Patient requesting to discharge. States that she would like to return back home with her mother and/or back to the [place]. Patient acknowledges that she needs someone to talk to about her problems and has a plan to restart therapy. She had a therapist when she was in her 39's and states it was very helpful. She does not have a  psychiatrist and is also interested in making an appointment with a new psychiatrist for med management.   Discussed patient's disposition and current presentation with the provider Maxie Barb, NP), patient ok to discharge pending collateral from mother. Patient refused to provide consent for mother but did for a friend. Clinician made contact with this friend x3. No answer. Voicemail was full.   Patient later stated that it was ok to speak with her mother Makira Holleman) for collateral information [phone]. Contacted patient's mother x3. She did not answer. Voicemail full and unable to leave a message. Disposition pending collateral information.

## 2020-01-29 NOTE — ED Notes (Signed)
Pt up at nurses station requesting to use the phone to call "my soul mate Thomasenia Sales" Pt provided the phone, pt did not get an answer.  Pt also tells this nurse she wants to speak with the social worker, related to her dog [place], this nurse notified social work of pt request. Will continue to monitor.

## 2020-01-29 NOTE — ED Notes (Signed)
This nurse notified psych NP of pt refusing ordered Abilify.

## 2020-01-29 NOTE — ED Notes (Signed)
Breakfast Ordered 

## 2020-01-29 NOTE — ED Notes (Signed)
Pt states that she would like to update her patient contact to [employer]. Phone number: [phone].

## 2020-01-29 NOTE — ED Notes (Signed)
Pt ambulated self to bathroom. Stated her anxiety about the toilet needing to be cleaned between uses.

## 2020-01-29 NOTE — ED Notes (Signed)
[facility] counselor reached out to this nurse  to verify it was ok to obtain collateral information from the pts mother; who was identified on the chart. This nurse to pt room to ask if the counselor could obtain collateral information from her mother because she identified on the chart, Pt informs this nurse it is a mistake, she does not want the counselor talking to her mother, and that she wants her friend Valorie Roosevelt contacted to obtain collateral information. This nurse notified counselor Jessie Foot of above, and provided [place] with the given contact information for pts friend Best boy.   [facility] counselor reaches back out to this nurse and informs her, she was unable to reach [place] with provided phone number, that it rang to a full voicemail, so unable to leave message.   This nurse notified pt that the counselor was unable to reach her friend with the number provided for collateral information, and that the counselor needed to speak to someone to receive collateral to conclude her assessment . Pt tells this nurse she does not want the counselor to talk to her mother, When asked for alternate contact information for Valorie Roosevelt pt tells this nurse the counslor can e-mail her friend Valorie Roosevelt, or call the [employer], that Valorie Roosevelt made be registered there.   This nurse notified Psych NP and counselor of pt request.   Pt then tells me, the [facility] counselor can talk to her mother who is identified on the chart, she had only said no initially because she does not want to live there. This nurse asked pt if she lived with mom prior to coming to the hospital, pt tells this nurse "No, she was living at the [facility]"  This nurse notified Psych NP and [facility] counselor of above so they can obtain collateral information.

## 2020-01-29 NOTE — BH Assessment (Signed)
Discussed patient's disposition and current presentation with the provider Maxie Barb, NP), patient ok to discharge pending collateral from mother. Patient refused to provide consent for mother but did for a friend. Clinician made contact with this friend x3. No answer. Voicemail was full.   Patient later stated that it was ok to speak with her mother Lavinia Mcneely) for collateral information [phone]. Contacted patient's mother x3. She did not answer. Voicemail full and unable to leave a message. Disposition pending collateral information.   Completed another attempt to reach patient's mother later this afternoon. Clinician was able to leave a voicemail but still unable to speak to mom directly. Provided updates to the provider who now recommends INPT treatment.   Patient at this time meets INPT treatment. She is to remain in the ED pending Disposition LCSW placement.

## 2020-01-29 NOTE — ED Notes (Signed)
Pt approaches this nurse, reports she needs my assistance with multiple things, appears anxious; pt tells this nurse she has an undiagnosed brain injury she needs assistance with, pt also requesting a "pelvic exam" when this nurse asked why she was requesting a pelvic exam, pt reports for her rash on her abdomen. On assessment red, irritated skin noted in the folds of pt abdomen; EDP Belfi made aware. Pt restless in room.

## 2020-01-29 NOTE — ED Notes (Addendum)
Informed in nursing report handoff at start of shift that pt had been tentatively accepted to [facility] to occur this morning, this nurse reached out to [facility] to confirm details, informed will follow back up with this nurse .

## 2020-01-29 NOTE — ED Notes (Signed)
Dinner Tray Ordered 1650 -ms 

## 2020-01-30 ENCOUNTER — Inpatient Hospital Stay (HOSPITAL_COMMUNITY)
Admission: AD | Admit: 2020-01-30 | Discharge: 2020-02-01 | DRG: 885 | Disposition: A | Discharge status: Home / Self Care | Payer: BLUE CROSS/BLUE SHIELD | Source: Intra-hospital | Attending: Psychiatry | Admitting: Psychiatry

## 2020-01-30 ENCOUNTER — Encounter (HOSPITAL_COMMUNITY): Payer: Self-pay | Admitting: Psychiatry

## 2020-01-30 ENCOUNTER — Other Ambulatory Visit: Payer: Self-pay

## 2020-01-30 ENCOUNTER — Other Ambulatory Visit: Payer: Self-pay | Admitting: Psychiatric/Mental Health

## 2020-01-30 DIAGNOSIS — Z6841 Body Mass Index (BMI) 40.0 and over, adult: Secondary | ICD-10-CM | POA: Diagnosis not present

## 2020-01-30 DIAGNOSIS — Z20822 Contact with and (suspected) exposure to covid-19: Secondary | ICD-10-CM | POA: Diagnosis present

## 2020-01-30 DIAGNOSIS — Z79899 Other long term (current) drug therapy: Secondary | ICD-10-CM | POA: Diagnosis not present

## 2020-01-30 DIAGNOSIS — I4891 Unspecified atrial fibrillation: Secondary | ICD-10-CM | POA: Diagnosis present

## 2020-01-30 DIAGNOSIS — B372 Candidiasis of skin and nail: Secondary | ICD-10-CM | POA: Diagnosis present

## 2020-01-30 DIAGNOSIS — F1490 Cocaine use, unspecified, uncomplicated: Secondary | ICD-10-CM | POA: Diagnosis present

## 2020-01-30 DIAGNOSIS — F1290 Cannabis use, unspecified, uncomplicated: Secondary | ICD-10-CM | POA: Diagnosis present

## 2020-01-30 DIAGNOSIS — Z8051 Family history of malignant neoplasm of kidney: Secondary | ICD-10-CM | POA: Diagnosis not present

## 2020-01-30 DIAGNOSIS — Z8249 Family history of ischemic heart disease and other diseases of the circulatory system: Secondary | ICD-10-CM

## 2020-01-30 DIAGNOSIS — Z83438 Family history of other disorder of lipoprotein metabolism and other lipidemia: Secondary | ICD-10-CM | POA: Diagnosis not present

## 2020-01-30 DIAGNOSIS — Z820 Family history of epilepsy and other diseases of the nervous system: Secondary | ICD-10-CM

## 2020-01-30 DIAGNOSIS — R000 Tachycardia, unspecified: Secondary | ICD-10-CM | POA: Diagnosis present

## 2020-01-30 DIAGNOSIS — Z9884 Bariatric surgery status: Secondary | ICD-10-CM

## 2020-01-30 DIAGNOSIS — I10 Essential (primary) hypertension: Secondary | ICD-10-CM | POA: Diagnosis present

## 2020-01-30 DIAGNOSIS — J45909 Unspecified asthma, uncomplicated: Secondary | ICD-10-CM | POA: Diagnosis present

## 2020-01-30 DIAGNOSIS — G43909 Migraine, unspecified, not intractable, without status migrainosus: Secondary | ICD-10-CM | POA: Diagnosis present

## 2020-01-30 DIAGNOSIS — Z833 Family history of diabetes mellitus: Secondary | ICD-10-CM | POA: Diagnosis not present

## 2020-01-30 DIAGNOSIS — Z87891 Personal history of nicotine dependence: Secondary | ICD-10-CM

## 2020-01-30 DIAGNOSIS — F419 Anxiety disorder, unspecified: Secondary | ICD-10-CM | POA: Diagnosis present

## 2020-01-30 DIAGNOSIS — F319 Bipolar disorder, unspecified: Secondary | ICD-10-CM | POA: Diagnosis present

## 2020-01-30 DIAGNOSIS — Z7901 Long term (current) use of anticoagulants: Secondary | ICD-10-CM

## 2020-01-30 DIAGNOSIS — K219 Gastro-esophageal reflux disease without esophagitis: Secondary | ICD-10-CM | POA: Diagnosis present

## 2020-01-30 DIAGNOSIS — R451 Restlessness and agitation: Secondary | ICD-10-CM | POA: Diagnosis present

## 2020-01-30 DIAGNOSIS — Z9151 Personal history of suicidal behavior: Secondary | ICD-10-CM

## 2020-01-30 DIAGNOSIS — R002 Palpitations: Secondary | ICD-10-CM | POA: Diagnosis present

## 2020-01-30 DIAGNOSIS — R079 Chest pain, unspecified: Secondary | ICD-10-CM | POA: Diagnosis present

## 2020-01-30 LAB — URINALYSIS, ROUTINE W REFLEX MICROSCOPIC
Bilirubin Urine: NEGATIVE
Glucose, UA: NEGATIVE mg/dL
Hgb urine dipstick: NEGATIVE
Ketones, ur: NEGATIVE mg/dL
Leukocytes,Ua: NEGATIVE
Nitrite: NEGATIVE
Protein, ur: NEGATIVE mg/dL
Specific Gravity, Urine: 1.005 (ref 1.005–1.030)
pH: 6 (ref 5.0–8.0)

## 2020-01-30 MED ORDER — ALUM & MAG HYDROXIDE-SIMETH 200-200-20 MG/5ML PO SUSP: 30.0000 mL | ORAL | Status: DC | PRN

## 2020-01-30 MED ORDER — LORAZEPAM 1 MG PO TABS
1.0000 mg | ORAL_TABLET | ORAL | Status: DC | PRN
  Administered 2020-01-30 – 2020-02-01 (×4): 1 mg via ORAL
  Filled 2020-01-30 (×5): qty 1

## 2020-01-30 MED ORDER — ARIPIPRAZOLE 5 MG PO TABS
5.0000 mg | ORAL_TABLET | Freq: Every day | ORAL | Status: DC
  Filled 2020-01-30 (×4): qty 1

## 2020-01-30 MED ORDER — MAGNESIUM HYDROXIDE 400 MG/5ML PO SUSP: 30.0000 mL | Freq: Every day | ORAL | Status: DC | PRN

## 2020-01-30 MED ORDER — LEVALBUTEROL TARTRATE 45 MCG/ACT IN AERO
2.0000 | INHALATION_SPRAY | Freq: Four times a day (QID) | RESPIRATORY_TRACT | Status: DC | PRN
  Filled 2020-01-30 (×2): qty 15

## 2020-01-30 MED ORDER — FUROSEMIDE 20 MG PO TABS
20.0000 mg | ORAL_TABLET | Freq: Every day | ORAL | Status: DC
  Administered 2020-01-31 – 2020-02-01 (×2): 20 mg via ORAL
  Filled 2020-01-30 (×4): qty 1

## 2020-01-30 MED ORDER — ACETAMINOPHEN 325 MG PO TABS
650.0000 mg | ORAL_TABLET | Freq: Four times a day (QID) | ORAL | Status: DC | PRN
  Administered 2020-01-30 – 2020-02-01 (×5): 650 mg via ORAL
  Filled 2020-01-30 (×5): qty 2

## 2020-01-30 MED ORDER — PANTOPRAZOLE SODIUM 40 MG PO TBEC
40.0000 mg | DELAYED_RELEASE_TABLET | Freq: Two times a day (BID) | ORAL | Status: DC
  Administered 2020-01-30 – 2020-02-01 (×4): 40 mg via ORAL
  Filled 2020-01-30 (×9): qty 1

## 2020-01-30 MED ORDER — ONDANSETRON HCL 4 MG PO TABS
4.0000 mg | ORAL_TABLET | Freq: Once | ORAL | Status: AC
  Administered 2020-01-30: 4 mg via ORAL
  Filled 2020-01-30 (×2): qty 1

## 2020-01-30 MED ORDER — APIXABAN 5 MG PO TABS: 5.0000 mg | ORAL_TABLET | Freq: Two times a day (BID) | ORAL | Status: DC

## 2020-01-30 MED ORDER — DILTIAZEM HCL ER COATED BEADS 240 MG PO CP24
240.0000 mg | ORAL_CAPSULE | Freq: Every day | ORAL | Status: DC
  Administered 2020-02-01: 240 mg via ORAL
  Filled 2020-01-30 (×4): qty 1

## 2020-01-30 MED ORDER — HYDROXYZINE HCL 50 MG PO TABS
50.0000 mg | ORAL_TABLET | Freq: Three times a day (TID) | ORAL | Status: DC | PRN
  Administered 2020-01-31 – 2020-02-01 (×2): 50 mg via ORAL
  Filled 2020-01-30 (×4): qty 1

## 2020-01-30 MED ORDER — NYSTATIN 100000 UNIT/GM EX POWD
Freq: Two times a day (BID) | CUTANEOUS | Status: DC
  Filled 2020-01-30: qty 15

## 2020-01-30 MED ORDER — ASPIRIN EC 81 MG PO TBEC
81.0000 mg | DELAYED_RELEASE_TABLET | Freq: Every day | ORAL | Status: DC
  Administered 2020-01-31 – 2020-02-01 (×2): 81 mg via ORAL
  Filled 2020-01-30 (×4): qty 1

## 2020-01-30 NOTE — ED Notes (Signed)
Patient left with officer Fransisco Hertz and belongings given to officer.

## 2020-01-30 NOTE — Progress Notes (Signed)
   01/30/20 2047  Psych Admission Type (Psych Patients Only)  Admission Status Involuntary  Psychosocial Assessment  Patient Complaints Anxiety  Eye Contact Brief  Facial Expression Anxious  Affect Anxious  Speech Logical/coherent  Interaction Assertive;Needy  Motor Activity Slow  Appearance/Hygiene In hospital gown  Behavior Characteristics Cooperative;Anxious  Mood Anxious;Depressed  Thought Process  Coherency WDL  Content WDL  Delusions None reported or observed  Perception WDL  Hallucination None reported or observed  Judgment WDL  Confusion None  Danger to Self  Current suicidal ideation? Denies  Danger to Others  Danger to Others None reported or observed   Pt at nurse's station. Pt c/o pain 7-8/10 and anxiety 2/10. Pt states she has pain around her belly button. Per day shift report, pt was physically assessed by NP. Pt pushing wheelchair around instead of sitting in it. Will remove wheelchair from room when pt asleep. If pt needs assistance, can use a walker. Observed pt ambulating and gait was steady with no obvious issues. Pt denies SI, HI and AVH.

## 2020-01-30 NOTE — ED Provider Notes (Signed)
Emergency Medicine Observation Re-evaluation Note  Alyssa Blanchard is a 49 y.o. female, seen on rounds today.  Pt initially presented to the ED for complaints of Palpitations and Anxiety Currently, the patient is sitting in bed in no acute distress.  Complaining of some bellybutton pain with an episode of emesis this morning after eating breakfast.  No diarrhea.  No other complaints.  Physical Exam  BP 128/80 (BP Location: Right Arm)   Pulse 88   Temp 98 F (36.7 C) (Oral)   Resp 18   Ht 5\' 6"  (1.676 m)   Wt (!) 187.8 kg   SpO2 99%   BMI 66.82 kg/m  Physical Exam General: Pleasant female sitting in bed Cardiac: Regular rate Lungs: No respiratory distress Abdomen: Mild tenderness towards bellybutton area, no peritoneal signs, no guarding.  Soft, nondistended. Psych female  ED Course / MDM  EKG:EKG Interpretation  Date/Time:  Thursday January 26 2020 14:45:12 EST Ventricular Rate:  145 PR Interval:    QRS Duration: 84 QT Interval:  310 QTC Calculation: 482 R Axis:   13 Text Interpretation: Sinus tachycardia Probable left atrial enlargement Low voltage, precordial leads No significant change since last tracing Confirmed by [id number] (Gwyneth Sprout) on 01/26/2020 3:03:17 PM    I have reviewed the labs performed to date as well as medications administered while in observation.  Recent changes in the last 24 hours include ODT Zofran as needed.  Plan  Current plan is for awaiting inpatient psych placement.  Abdomen soft without any peritoneal signs, I think patient had some gastroenteritis after eating something for breakfast, Zofran ordered, if patient continues to complain of abdominal pain after Zofran we can obtain more labs however do not think that this is necessary at this time due to how well patient appears. Urinalysis ordered.  Patient is under full IVC at this time.   01/28/2020, PA-C 01/30/20 1034    02/01/20, MD 01/31/20 [phone]

## 2020-01-30 NOTE — Progress Notes (Signed)
Patient ID: Alyssa Blanchard, female   DOB: April 16, 1970, 49 y.o.   MRN: [id number] Admission Note  Pt is a 49 yo female that presents IVC'd on 01/30/2020 with worsening anxiety, depression, isolation, and worrying. Pt denies ever being si/hi or having avh. Pt presents having abdominal pain, stating they aren't sure what is going on or why they started hurting. "I woke up and the ED looked at it but I never heard anything". Pt presents in a wheelchair stating they feel unsteady. "I need some sugar". Pt states they use cannabis occ. Pt states they don't drink often but they binge drink when they do. Pt denies Rx/tobacco use/abuse. Pt denies a pcp or taking medications. Pt endorses financial strain. Pt is unsure of discharge planning right now. Pt endorses support of friends. Pt states they are "working on it" with their family. Pt states they have multiple areas on their skin with yeast and they are requesting power/cream for this. Pt denies si/hi/ah/vh and verbally agrees to approach staff if these become apparent or before harming self/others while at bhh. Consents signed, handbook detailing the patient's rights, responsibilities, and visitor guidelines provided. Skin/belongings search completed and patient oriented to unit. Patient stable at this time. Patient given the opportunity to express concerns and ask questions. Patient given toiletries. Will continue to monitor.   [facility] Assessment 01/29/2020:  Upon review of notes from patient's initial arrival: "Patient was very anxious with a flat and depressed affect". "She states that she called EMS due to chest pain and she states she was extremely anxious". Additional documentation noted on the day of her arrival: She was noted to be wandering in the hallway, knocking on walls and doors". Pt denied SI, HI, AVH upon arrival.   Clinician assessed patient on this day. She states, "Oh my, I feel much better". She informs me that she lives with her mother. However,  decided to go to the [employer] days. States that she likes to go to the [place] because it gives her a chance to get away and relax. States that she had only been to the resort x1 day and started to feel depressed and anxious. Patients describes her depressive symptoms as isolation, hopelessness, and worthlessness. Although she acknowledged these depressive symptoms upon arrival she denies those symptoms in today's assessment. Again stating, "I feel so much better today". She states that her anxiety can become severe and this was her situation upon arrival to the ED.  States, "I felt like my chest was tightening up and I was having a heart attack". Patient called EMS to bring her to the Emergency Department. Today, she reports feeling calm and has no symptoms of anxiety. Patient observed to be calm and cooperative. No signs of anxiety or depression. States that she didn't have a specific trigger or stressor. However, admits that she often "over thinks" and "I get myself all worked up for nothing". Patient sates that she has been sleeping well in the Emergency Department. States that her appetite has been good and she is eating 3 meals a day.  She denies current and recent suicidal ideations. She last had suicidal ideations and made a suicide attempt by overdose, February 2021.  Patient denies self mutilating behaviors. Denies HI and AVH's. Denies alcohol and drug use.   Patient was calm and cooperative. Her affect was appropriate. Speech was normal. She was not restless or displayed any signs of anxiety or manic. Insight and judgement were good. Impulse control is good. Thought  processes were intact. Memory is recent and remote intact. She does not appear to be responding to internal stimuli or display any signs of delusional thought processes's.   Patient requesting to discharge. States that she would like to return back home with her mother and/or back to the [place]. Patient acknowledges that  she needs someone to talk to about her problems and has a plan to restart therapy. She had a therapist when she was in her 60's and states it was very helpful. She does not have a  psychiatrist and is also interested in making an appointment with a new psychiatrist for med management.

## 2020-01-30 NOTE — ED Notes (Addendum)
Requested [facility] to transport pt to [facility]. Called report  To [facility] RN Casimiro Needle. Obtained all belongings and pocket knife from security.

## 2020-01-30 NOTE — Progress Notes (Signed)
Dar Note: Patient umbilicus area dry and peeling with pain to touch.  Excoriation noted in groin area and inner thighs. Discoloration noted around upper abdomen.

## 2020-01-30 NOTE — Progress Notes (Signed)
Pt still c/o pain in belly button area. Pt pain level 8/10. Pt helped to stand up and bed repositioned.

## 2020-01-30 NOTE — Progress Notes (Signed)
Pt accepted to  [facility], bed 505-1   Maxie Barb, NP is the accepting provider.    Dr. Brien Few is the attending provider.    Call report to [phone]    [facility] ED notified.     Pt is under IVC and will be transported by Patent examiner.  Pt is scheduled to arrive at [facility] after 230pm.   Wells Guiles, MSW, LCSW, LCAS Clinical Social Worker II Disposition CSW [phone]

## 2020-01-30 NOTE — Tx Team (Signed)
Initial Treatment Plan 01/30/2020 5:30 PM Alyssa Blanchard [id number]    PATIENT STRESSORS: Financial difficulties Health problems Medication change or noncompliance Occupational concerns Substance abuse   PATIENT STRENGTHS: Capable of independent living Barrister's clerk for treatment/growth Supportive family/friends   PATIENT IDENTIFIED PROBLEMS: anxiety  homelessness  worrying  Financial strain               DISCHARGE CRITERIA:  Ability to meet basic life and health needs Improved stabilization in mood, thinking, and/or behavior Medical problems require only outpatient monitoring Motivation to continue treatment in a less acute level of care  PRELIMINARY DISCHARGE PLAN: Attend aftercare/continuing care group Attend 12-step recovery group Outpatient therapy Placement in alternative living arrangements  PATIENT/FAMILY INVOLVEMENT: This treatment plan has been presented to and reviewed with the patient, Alyssa Blanchard.  The patient and family have been given the opportunity to ask questions and make suggestions.  Raylene Miyamoto, RN 01/30/2020, 5:30 PM

## 2020-01-31 ENCOUNTER — Encounter (HOSPITAL_COMMUNITY): Payer: Self-pay | Admitting: Nurse Practitioner

## 2020-01-31 DIAGNOSIS — F319 Bipolar disorder, unspecified: Principal | ICD-10-CM

## 2020-01-31 LAB — COMPREHENSIVE METABOLIC PANEL
ALT: 44 U/L (ref 0–44)
AST: 44 U/L — ABNORMAL HIGH (ref 15–41)
Albumin: 4.3 g/dL (ref 3.5–5.0)
Alkaline Phosphatase: 92 U/L (ref 38–126)
Anion gap: 12 (ref 5–15)
BUN: 13 mg/dL (ref 6–20)
CO2: 24 mmol/L (ref 22–32)
Calcium: 8.9 mg/dL (ref 8.9–10.3)
Chloride: 100 mmol/L (ref 98–111)
Creatinine, Ser: 0.91 mg/dL (ref 0.44–1.00)
GFR, Estimated: >60 mL/min (ref >60)
Glucose, Bld: 106 mg/dL — ABNORMAL HIGH (ref 70–99)
Potassium: 3.2 mmol/L — ABNORMAL LOW (ref 3.5–5.1)
Sodium: 136 mmol/L (ref 135–145)
Total Bilirubin: 0.8 mg/dL (ref 0.3–1.2)
Total Protein: 7.8 g/dL (ref 6.5–8.1)

## 2020-01-31 LAB — CBC WITH DIFFERENTIAL/PLATELET
Abs Immature Granulocytes: 0.04 10*3/uL (ref 0.00–0.07)
Basophils Absolute: 0.1 10*3/uL (ref 0.0–0.1)
Basophils Relative: 1 %
Eosinophils Absolute: 0.2 10*3/uL (ref 0.0–0.5)
Eosinophils Relative: 3 %
HCT: 37.1 % (ref 36.0–46.0)
Hemoglobin: 11.5 g/dL — ABNORMAL LOW (ref 12.0–15.0)
Immature Granulocytes: 1 %
Lymphocytes Relative: 18 %
Lymphs Abs: 1.5 10*3/uL (ref 0.7–4.0)
MCH: 25.3 pg — ABNORMAL LOW (ref 26.0–34.0)
MCHC: 31 g/dL (ref 30.0–36.0)
MCV: 81.5 fL (ref 80.0–100.0)
Monocytes Absolute: 0.9 10*3/uL (ref 0.1–1.0)
Monocytes Relative: 11 %
Neutro Abs: 5.8 10*3/uL (ref 1.7–7.7)
Neutrophils Relative %: 66 %
Platelets: 278 10*3/uL (ref 150–400)
RBC: 4.55 MIL/uL (ref 3.87–5.11)
RDW: 16.8 % — ABNORMAL HIGH (ref 11.5–15.5)
WBC: 8.6 10*3/uL (ref 4.0–10.5)
nRBC: 0 % (ref 0.0–0.2)

## 2020-01-31 LAB — LIPID PANEL
Cholesterol: 142 mg/dL (ref 0–200)
HDL: 35 mg/dL — ABNORMAL LOW (ref >40)
LDL Cholesterol: 86 mg/dL (ref 0–99)
Total CHOL/HDL Ratio: 4.1 RATIO
Triglycerides: 103 mg/dL (ref <150)
VLDL: 21 mg/dL (ref 0–40)

## 2020-01-31 LAB — HEMOGLOBIN A1C
Hgb A1c MFr Bld: 6.1 % — ABNORMAL HIGH (ref 4.8–5.6)
Mean Plasma Glucose: 128.37 mg/dL

## 2020-01-31 LAB — TSH: TSH: 3.29 u[IU]/mL (ref 0.350–4.500)

## 2020-01-31 MED ORDER — LORAZEPAM 1 MG PO TABS
1.0000 mg | ORAL_TABLET | ORAL | Status: AC | PRN
  Administered 2020-01-31: 1 mg via ORAL

## 2020-01-31 MED ORDER — APIXABAN 5 MG PO TABS
5.0000 mg | ORAL_TABLET | Freq: Two times a day (BID) | ORAL | Status: DC
  Administered 2020-01-31 – 2020-02-01 (×2): 5 mg via ORAL
  Filled 2020-01-31 (×6): qty 1

## 2020-01-31 MED ORDER — OLANZAPINE 10 MG PO TBDP: 10.0000 mg | ORAL_TABLET | Freq: Three times a day (TID) | ORAL | Status: DC | PRN

## 2020-01-31 MED ORDER — ZIPRASIDONE MESYLATE 20 MG IM SOLR: 20.0000 mg | INTRAMUSCULAR | Status: DC | PRN

## 2020-01-31 NOTE — Progress Notes (Signed)
Pt asked to see this Clinical research associate. Pt states that she can't sleep. "I'm in the bed and I begin to doze off and my body jerks and I wake up. For some reason my body doesn't want to let me go to sleep." Pt has just ambulated, with the walker, from her bed to the hallway and back to her room and is sitting on the bench. Pt keeps closing her eyes. Pt told that it is not safe for her to sit on the bench and lean on the walker. "I know what it is. Something else is keeping me awake. It's him. I can feel him near me. He's here." When asking who she is talking about pt responds, "Jason. I know I sound crazy but he's here."   Pt asks if she can eat some food. "Maybe if I eat, it'll make me sleepy." Pt cautioned not to stay on the bench but to get back into bed. Will continue to monitor.

## 2020-01-31 NOTE — [facility] Suicide Risk Assessment (Signed)
[facility] Admission Suicide Risk Assessment   Nursing information obtained from:  Patient, Review of record Demographic factors:  Living alone, Caucasian, Unemployed, Low socioeconomic status Current Mental Status:  NA Loss Factors:  Decrease in vocational status, Financial problems / change in socioeconomic status, Decline in physical health Historical Factors:  Impulsivity Risk Reduction Factors:  Positive social support, Positive coping skills or problem solving skills, Religious beliefs about death, Positive therapeutic relationship  Total Time spent with patient: 1 hour Principal Problem: <principal problem not specified> Diagnosis:  Active Problems:   Bipolar 1 disorder (HCC)  Subjective Data:   49 yo caucasian female with h/o chronic medical illnesses, morbid obesity and bipolar who initially presented on 11/18 to the ED for CP from the [place] [facility]. CP was worked up in the ED and she was medically cleared. At discharge she was becoming agitated, anxious, was walking around the ED knocking on doors and windows. Pt threatened to "run out" and was IVC'd. She was transferred to [facility] for stabilization. While in the ER was refusing her abilify. Most recent medications per chart are abilify 400 mg IM (due 05/17/2019) and risperdal 4 mg qhs.  Pt states she has bad anxiety and it gets to where she cant control it and she "pushes myself into A fib" and it makes her feel like she is having a heart attack. She stated that this was her reason for presenting to the hospital; she called 911 and was brought to the ER.   Pt states she is unsure why she was brought to the psychiatric hospital. When asked specifically about things that were done or said while in the ED, she states  that she was "nice and cordial" and does not know why she as brought here. She suspects that she was brought to the psychiatric hospital because of things that are documented in the chart and that it was "the past". Pt states that she  recalls most recent hospitalization in February and states at that time she was having a manic episode. She describes decreased need for sleep, FOI, distractibility, increased goal directed activity and paranoia. She states that she was not compliant with abilify LAI after leaving the hospital because she felt like she was forced to take it. She states that her only medication since this time has been hydroxyzine for anxiety. She denies any psychiatric hospitalizations in the interim. She denies SI/HI. She cites her friends and family as protective factors. .Pt states that "mental health medication" makes her mind stop and she can't remember very much which is why she has been refusing abilify.   Pt states that when she was staying st her mothers house she was taking 50-75 mg hydroxyzine BID PRN for anxiety. Says if it gets combined with other medications "it does the exact opposite" and her heart "goes berzerk" and makes her anxiety increase instead of decrease. Pt states that she takes tylenol at home for pain.   Pt denies current manic ssx and does not objectively appear manic. Is aware of her dx, says that her manic episodes often occur when she is not sleeping well and is often paranoid and will have FOI and easy distractability. Says her last manic episode as [name] passed away. [name] as her best friend and passed away in April 05, 2022, it was unexpected. Pt states that she has been sleeping well. Mood is "good".  Admits being concerned about "the state of the world today". Pt denies paranooia/IOR/TI/TW/TB. Denies SI/HI/ AVH. Marland Kitchen   Pt  states that she was at her mother's home until Tuesday night when she checked into the [place] and went to the hospital from there.  Pt reports remote h/o substance abuse. Says she has not drank heavily in the last 85months but had 3 drinks about 1 week ago. Previous crack use but has not used in the last 2 months. Pt states that she enjoys groups but her issue is having to  "stay here". Would be interested in outpatient groups.    Continued Clinical Symptoms:  Alcohol Use Disorder Identification Test Final Score (AUDIT): 19 The "Alcohol Use Disorders Identification Test", Guidelines for Use in Primary Care, Second Edition.  World Science writer [facility]). Score between 0-7:  no or low risk or alcohol related problems. Score between 8-15:  moderate risk of alcohol related problems. Score between 16-19:  high risk of alcohol related problems. Score 20 or above:  warrants further diagnostic evaluation for alcohol dependence and treatment.   CLINICAL FACTORS:   Severe Anxiety and/or Agitation Panic Attacks Previous Psychiatric Diagnoses and Treatments Medical Diagnoses and Treatments/Surgeries   See H&P for exam and ROS   COGNITIVE FEATURES THAT CONTRIBUTE TO RISK:  Thought constriction (tunnel vision)    SUICIDE RISK:   Minimal: No identifiable suicidal ideation.  Patients presenting with no risk factors but with morbid ruminations; may be classified as minimal risk based on the severity of the depressive symptoms  PLAN OF CARE:  See H &P for full plan of care. Patient presented to the ED on 11/18 for CP. At time of discharge patient began acting agitated. TTS was consulted, was a possible discharge pending colleteral but collateral was unable to be reached and she was transferred to [facility] for safety and stabilization. On exam today patient denies SI/HI/AVH and does not appear acutely manic or psychotic (able to converse coherently, linear and goal directed thought, no RIS, no distractibility, not pre-occupied, no FOI, etc). However, she does report overwhelming anxiety in the context of numerous stressors with her health, conflict with mother, and passing of friend in January of this year. Collateral reports patient has been spending money she doesn't have and that she has been sad since her friend passed. She feels she needs help with finding a  therapist. Pt would benefit from ongoing hospitalization for stabiliztion  I certify that inpatient services furnished can reasonably be expected to improve the patient's condition.   Estella Husk, MD 01/31/2020, 1:10 PM

## 2020-01-31 NOTE — Progress Notes (Signed)
Adult Psychoeducational Group Note  Date:  01/31/2020 Time:  3:48 AM  Group Topic/Focus:  Wrap-Up Group:   The focus of this group is to help patients review their daily goal of treatment and discuss progress on daily workbooks.  Participation Level:  Active  Participation Quality:  Appropriate  Affect:  Appropriate  Cognitive:  Appropriate  Insight: Appropriate  Engagement in Group:  Developing/Improving  Modes of Intervention:  Discussion  Additional Comments:  Pt stated her goal for today was to focus on her treatment plan. Pt stated she accomplished her goal today. Pt stated her relationship with her family has improved since she was admitted. Pt stated been able to contact her family friend today improved her day. Pt rated her overall day a 10. Pt stated the only thing negative about her over all day is she could not stop vomiting. Writer asked pt when was the last time this happen. Pt stated this happen ago. Pt nurse was updated on situation. Pt stated her appetite was pretty good today. Pt stated her sleep last night was pretty good. Pt stated she was in some physical pain today. Pt stated she had pain in her lower part of her stomach. Pt rated her pain level and 8. Pt nurse was made aware of the situation. Pt deny auditory or visual hallucinations. Pt denies thoughts of harming herself or others. Pt stated she would alert staff if anything changes.  Felipa Furnace 01/31/2020, 3:48 AM

## 2020-01-31 NOTE — Progress Notes (Signed)
Recreation Therapy Notes  Date: 11.23.21 Time: 1000 Location: 500 Hall Dayroom   Group Topic: Support System   Goal Area(s) Addresses:  Patient will identify members of their support system. Patient will verbalize benefit of healthy supports. Patient will verbalize positive effect of healthy supports post d/c.  Patient will identify negative relationships in support system and discuss alternatives.    Intervention: Scientist, clinical (histocompatibility and immunogenetics), colored pencils   Activity: Furniture conservator/restorer.  LRT led art activity in getting patients to identify members of their support system good and bad outside of the hospital.  Patients were to then map out proximity of the people in their support system in relation to themselves.   Education: Heritage manager, Special educational needs teacher, Discharge Planning   Education Outcome: Acknowledges understanding/In group clarification offered/Needs additional education.   Clinical Observations/Feedback: Pt did not attend group session.    Meryle Ready, LRT/CTRS         Caroll Rancher A 01/31/2020 12:21 PM
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
## 2020-01-31 NOTE — Progress Notes (Signed)
Pt states she is still having pain around her belly button area. Pt given Tylenol 650 mg. Rates pain 8/10. Will continue to monitor.
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
## 2020-01-31 NOTE — Progress Notes (Signed)
Adult Psychoeducational Group Note  Date:  01/31/2020 Time:  10:50 PM  Group Topic/Focus:  Wrap-Up Group:   The focus of this group is to help patients review their daily goal of treatment and discuss progress on daily workbooks.  Participation Level:  Did Not Attend  Participation Quality:  Did Not Attend  Affect:  Did Not Attend  Cognitive:  Did Not Attend  Insight: None  Engagement in Group:  Did Not Attend  Modes of Intervention:  Did Not Attend  Additional Comments:  Pt did not attend evening wrap up group tonight.  Felipa Furnace 01/31/2020, 10:50 PM

## 2020-01-31 NOTE — Progress Notes (Signed)
   01/31/20 1200  Psych Admission Type (Psych Patients Only)  Admission Status Involuntary  Psychosocial Assessment  Patient Complaints Anxiety  Eye Contact Brief  Facial Expression Anxious  Affect Anxious  Speech Logical/coherent  Interaction Assertive  Motor Activity Slow  Appearance/Hygiene In hospital gown  Behavior Characteristics Anxious  Mood Depressed;Anxious  Thought Process  Coherency WDL  Content WDL  Delusions None reported or observed  Perception WDL  Hallucination None reported or observed  Judgment WDL  Confusion None  Danger to Self  Current suicidal ideation? Denies  Danger to Others  Danger to Others None reported or observed  Pt refused her morning Abilify and Cardizem medication stating she does not have blood pressure problems or need Abilify. Pt c/o of anxiety and was rapidly moving her eyes back and forth. When asked about the eye movement, she responded she was using it as a technique she has learned to decrease anxiety. She says she uses breathing techniques as well. Pt was given PRN medication as ordered. She is currently in her room talking with her SW. Pt denies si and hi. Safety maintained on the unit.

## 2020-01-31 NOTE — Progress Notes (Signed)
Recreation Therapy Notes  Patient admitted to unit 11.22.21. Due to admission within last year, no new assessment conducted at this time. Last assessment conducted 02.05.21. Patient reports fate of the world, the pandemic, her health and her mother's health as current stressors.  Patient stated reason for admission was anxiety because she thought she was having a heart attack.  Patient identified coping skills as counting, eye movement therapy, box breathing and prayer.  Leisure interests continue to be television, sitting outside, cooking and loving animals.  Patient strengths identified as "capacity to love everybody and help people".  Patient identified areas of improvement as everything about herself "self love".     Patient denies SI, HI, AVH at this time. Patient reports goal of getting out of here.      Caroll Rancher, LRT/CTRS     Caroll Rancher A 01/31/2020 12:24 PM

## 2020-02-01 LAB — PREGNANCY, URINE: Preg Test, Ur: NEGATIVE

## 2020-02-01 MED ORDER — ARIPIPRAZOLE 5 MG PO TABS: 5.0000 mg | ORAL_TABLET | Freq: Every day | ORAL | 0 refills | Status: DC

## 2020-02-01 MED ORDER — HYDROXYZINE HCL 50 MG PO TABS: 50.0000 mg | ORAL_TABLET | Freq: Three times a day (TID) | ORAL | 0 refills | Status: DC | PRN

## 2020-02-01 MED ORDER — NYSTATIN 100000 UNIT/GM EX POWD: Freq: Two times a day (BID) | CUTANEOUS | 0 refills | Status: DC

## 2020-02-01 NOTE — Discharge Summary (Signed)
Physician Discharge Summary Note  Patient:  Alyssa Blanchard is an 48 y.o., female  MRN:  [id number]  DOB:  07-12-1970  Patient phone:  [phone] (home)   Patient address:   [place] [address],  Total Time spent with patient: Greater than 30 minutes  Date of Admission:  01/30/2020  Date of Discharge: 02-01-20  Reason for Admission: Worsening symptoms of Bipolar disorder.  Principal Problem: Bipolar 1 disorder [facility])  Discharge Diagnoses: Principal Problem:   Bipolar 1 disorder (HCC)  Past Psychiatric History: Chronic undertreated bipolar symptoms  Past Medical History:  Past Medical History:  Diagnosis Date  . Anxiety   . Asthma   . Depression   . Dysrhythmia    new onset Afib rvr  . GERD (gastroesophageal reflux disease)   . Heel spur    bilat  . History of bronchitis   . History of chicken pox   . History of urinary tract infection   . Migraines   . Plantar fasciitis    bilat  . STD (sexually transmitted disease)    chl hx & hsv 1&2  . Tremors of nervous system   . Urinary incontinence     Past Surgical History:  Procedure Laterality Date  . CARDIOVERSION N/A 08/05/2019   Procedure: CARDIOVERSION;  Surgeon: [name] Deloris Ping, MD;  Location: [facility] ENDOSCOPY;  Service: Cardiovascular;  Laterality: N/A;  . LAPAROSCOPIC GASTRIC SLEEVE RESECTION N/A 11/27/2014   Procedure: LAPAROSCOPIC GASTRIC SLEEVE RESECTION WITH HIATAL HERNIA REPAIR UPPER ENDOSCOPY;  Surgeon: Luretha Murphy, MD;  Location: [facility] ORS;  Service: General;  Laterality: N/A;  . TEE WITHOUT CARDIOVERSION N/A 08/05/2019   Procedure: TRANSESOPHAGEAL ECHOCARDIOGRAM (TEE);  Surgeon: [name] Deloris Ping, MD;  Location: [facility] ENDOSCOPY;  Service: Cardiovascular;  Laterality: N/A;  . TONSILLECTOMY  1978   Family History:  Family History  Problem Relation Age of Onset  . Diabetes Mother   . Hypertension Mother   . Hyperlipidemia Mother   . Kidney disease Mother   . Cancer Father         pancreatic  . Hypertension Maternal Grandmother   . Diabetes Maternal Grandmother   . Heart failure Maternal Grandmother        CHF  . Heart attack Maternal Grandfather   . Hypertension Maternal Grandfather   . Hypertension Paternal Grandmother   . Alzheimer's disease Paternal Grandmother   . Hypertension Paternal Grandfather   . Cancer Maternal Uncle        melanoma  . Cancer Paternal Uncle        kidney   Family Psychiatric  History: See H&P Social History:  Social History   Substance and Sexual Activity  Alcohol Use Yes  . Alcohol/week: 0.0 standard drinks   Comment: 1 a month; "binge drinker"     Social History   Substance and Sexual Activity  Drug Use Yes  . Types: Marijuana, Cocaine    Social History   Socioeconomic History  . Marital status: Single    Spouse name: Not on file  . Number of children: Not on file  . Years of education: 98  . Highest education level: Not on file  Occupational History  . Occupation: Chartered certified accountant: [employer]  Tobacco Use  . Smoking status: Former Smoker    Packs/day: 0.25    Years: 5.00    Pack years: 1.25    Types: Cigarettes    Quit date: 03/10/2012    Years since quitting: 7.9  . Smokeless  tobacco: Never Used  Vaping Use  . Vaping Use: Never used  Substance and Sexual Activity  . Alcohol use: Yes    Alcohol/week: 0.0 standard drinks    Comment: 1 a month; "binge drinker"  . Drug use: Yes    Types: Marijuana, Cocaine  . Sexual activity: Yes    Partners: Male    Birth control/protection: Condom  Other Topics Concern  . Not on file  Social History Narrative   Regular exercise-no   Caffeine Use-yes, 1-2 cups of caffeine daily   Social Determinants of Health   Financial Resource Strain:   . Difficulty of Paying Living Expenses: Not on file  Food Insecurity:   . Worried About Programme researcher, broadcasting/film/video in the Last Year: Not on file  . Ran Out of Food in the Last Year: Not on file  Transportation Needs:    . Lack of Transportation (Medical): Not on file  . Lack of Transportation (Non-Medical): Not on file  Physical Activity:   . Days of Exercise per Week: Not on file  . Minutes of Exercise per Session: Not on file  Stress:   . Feeling of Stress : Not on file  Social Connections:   . Frequency of Communication with Friends and Family: Not on file  . Frequency of Social Gatherings with Friends and Family: Not on file  . Attends Religious Services: Not on file  . Active Member of Clubs or Organizations: Not on file  . Attends Banker Meetings: Not on file  . Marital Status: Not on file   Hospital Course: (Per Md's admission evaluation notes): 49 yo caucasian female with h/o chronic medical illnesses, morbid obesity and bipolar who initially presented on 11/18 to the ED for CP from the [place] [facility]. CP was worked up in the ED and she was medically cleared. At discharge she was becoming agitated, anxious, was walking around the ED knocking on doors and windows. Pt threatened to "run out" and was IVC'd. She was transferred to [facility] for stabilization. While in the ER was refusing her abilify. Most recent medications per chart are abilify 400 mg IM (due 05/17/2019) and risperdal 4 mg qhs. Pt states she has bad anxiety and it gets to where she cant control it and she "pushes myself into A fib" and it makes her feel like she is having a heart attack. She stated that this was her reason for presenting to the hospital; she called 911 and was brought to the ER.   Pt states she is unsure why she was brought to the psychiatric hospital. When asked specifically about things that were done or said while in the ED, she states  that she was "nice and cordial" and does not know why she as brought here. She suspects that she was brought to the psychiatric hospital because of things that are documented in the chart and that it was "the past". Pt states that she recalls most recent hospitalization in February  and states at that time she was having a manic episode. She describes decreased need for sleep, FOI, distractibility, increased goal directed activity and paranoia. She states that she was not compliant with abilify LAI after leaving the hospital because she felt like she was forced to take it. She states that her only medication since this time has been hydroxyzine for anxiety. She denies any psychiatric hospitalizations in the interim. She denies SI/HI. She cites her friends and family as protective factors. .Pt states that "mental health  medication" makes her mind stop and she can't remember very much which is why she has been refusing abilify.   This is one of several psychiatric discharge summaries from the [place] systems for this 49 year old Caucasian female. She is with hx of chronic mental illness, other chronmic medical issues & multiple psychiatric admissions. She has been tried on multiple psychotropic medications for her symptoms & it appeared Analisia has not been able to achieve symptoms control as of yet. She was brought to the hospital this time around for evaluation & treatment for worsening symptoms of her mental illness.   After evaluation of her presenting symptoms, Alyssa Blanchard was recommended for mood stabilization treatments. The medication regimen for her presenting symptoms were discussed & with her consent initiated. She received, stabilized & was discharged on the medications as listed below on her discharge medication lists. She was also enrolled & participated in the group counseling sessions being offered & held on this unit. She learned coping skills. She presented on this admission, other chronic medical conditions that required treatment & monitoring. She was resumed, treated & discharged on all her pertinent home medications for those pre-existing health issues. She tolerated her treatment regimen without any adverse effects or reactions reported.  During the course of her  hospitalization, the 15-minute checks were adequate to ensure Alyssa Blanchard's safety.  Patient did not display any dangerous, violent or suicidal behavior on the unit. She interacted with patients & staff appropriately. She participated appropriately in the group sessions/therapies. Her medications were addressed & adjusted to meet her needs. She was recommended for outpatient follow-up care & medication management upon discharge to assure her continuity of care.  At the time of discharge, patient is not reporting any acute suicidal/homicidal ideations. She denies any mood instability. She feels here medications has been helpful stabilizing her symptoms. She currently denies any new issues or concerns. Education and supportive counseling provided throughout her hospital stay & upon discharge.  Today upon her discharge evaluation with the attending psychiatrist, Alyssa Blanchard shares she is doing well. She denies any other specific concerns. She is sleeping well. Her appetite is good. She denies other physical complaints. She denies AH/VH. She feels that her medications have been helpful & is in agreement to continue her current treatment regimen as recommended. She was able to engage in safety planning including plan to return to [facility] or contact emergency services if she feels unable to maintain her own safety or the safety of others. Pt had no further questions, comments, or concerns. She left [facility] with all personal belongings in no apparent distress. Transportation per [employer].  Musculoskeletal: Strength & Muscle Tone: within normal limits Gait & Station: normal Patient leans: N/A  Psychiatric Specialty Exam: Physical Exam Vitals and nursing note reviewed.  HENT:     Head: Normocephalic.     Nose: Nose normal.     Mouth/Throat:     Pharynx: Oropharynx is clear.  Eyes:     Pupils: Pupils are equal, round, and reactive to light.  Cardiovascular:     Rate and Rhythm: Normal rate.     Pulses:  Normal pulses.  Pulmonary:     Effort: Pulmonary effort is normal.  Genitourinary:    Comments: Deferred Musculoskeletal:        General: Normal range of motion.     Cervical back: Normal range of motion.  Skin:    General: Skin is warm and dry.  Neurological:     Mental Status:  She is alert and oriented to person, place, and time.     Review of Systems  Constitutional: Negative for chills, diaphoresis and fever.  HENT: Negative for congestion, rhinorrhea, sneezing and sore throat.   Eyes: Negative for discharge.  Respiratory: Negative for cough, chest tightness, shortness of breath and wheezing.   Cardiovascular: Negative for chest pain and palpitations.  Gastrointestinal: Negative for diarrhea, nausea and vomiting.  Endocrine: Negative for cold intolerance.  Genitourinary: Negative for difficulty urinating.  Musculoskeletal: Negative for arthralgias (Stabilized with medication prior to discharge) and myalgias.  Skin: Negative.   Allergic/Immunologic:       Gabapentin  Topiramate  Montelukast  Prozac Trazodone And Nefazodone  Copper-containing Compounds       Neurological: Negative for dizziness, tremors, seizures, syncope, facial asymmetry, speech difficulty, weakness, light-headedness, numbness and headaches.  Psychiatric/Behavioral: Positive for dysphoric mood and sleep disturbance (Stabilized with medication prior to discharge). Negative for agitation, behavioral problems, confusion, decreased concentration, hallucinations, self-injury and suicidal ideas. The patient is not nervous/anxious (Stable upon discharge) and is not hyperactive.     Blood pressure 140/68, pulse 92, temperature 99 F (37.2 C), temperature source Oral, resp. rate 20, height 5\' 4"  (1.626 m), weight (!) 187.8 kg, SpO2 99 %.Body mass index is 71.06 kg/m.  See Md's discharge SRA   Have you used any form of tobacco in the last 30 days? (Cigarettes, Smokeless Tobacco, Cigars, and/or Pipes): No  Has  this patient used any form of tobacco in the last 30 days? (Cigarettes, Smokeless Tobacco, Cigars, and/or Pipes): N/A  Blood Alcohol level:  Lab Results  Component Value Date   ETH <10 04/13/2019   Metabolic Disorder Labs:  Lab Results  Component Value Date   HGBA1C 6.1 (H) 01/31/2020   MPG 128.37 01/31/2020   MPG 119.76 04/16/2019   Lab Results  Component Value Date   PROLACTIN 50.4 (H) 04/16/2019   Lab Results  Component Value Date   CHOL 142 01/31/2020   TRIG 103 01/31/2020   HDL 35 (L) 01/31/2020   CHOLHDL 4.1 01/31/2020   VLDL 21 01/31/2020   LDLCALC 86 01/31/2020   LDLCALC 78 04/16/2019   See Psychiatric Specialty Exam and Suicide Risk Assessment completed by Attending Physician prior to discharge.  Discharge destination:  Home  Is patient on multiple antipsychotic therapies at discharge:  No   Has Patient had three or more failed trials of antipsychotic monotherapy by history:  No  Recommended Plan for Multiple Antipsychotic Therapies: NA Standard overlap with long-acting injectable aripiprazole  Allergies as of 02/01/2020      Reactions   Gabapentin Anaphylaxis   Topiramate Other (See Comments)   Jaw tightness and chest discomfort    Imitrex [sumatriptan] Other (See Comments)   Increased heart rate   Latex Itching   Montelukast Other (See Comments)   Nightmares    Other Itching, Other (See Comments)   Pt states "no narcotics ever" 04/13/2019- denies this entry in 01/2020 Baking soda- itching "Mental health meds, in general, don't agree with me" Plastic and artificial Christmas trees = Itching   Prozac [fluoxetine] Other (See Comments)   Bad, vivid dreams   Trazodone And Nefazodone Other (See Comments)   Bad dreams 01/27/20 Pt is unsure if allergic to Nefazodone   Copper-containing Compounds Rash, Other (See Comments)   Makes the face burn, also      Medication List    TAKE these medications     Indication  apixaban 5 MG Tabs tablet Commonly  known as: ELIQUIS Take 1 tablet (5 mg total) by mouth 2 (two) times daily.  Indication: Blood Clot in a Deep Vein   ARIPiprazole 5 MG tablet Commonly known as: ABILIFY Take 1 tablet (5 mg total) by mouth daily. For mood control Start taking on: February 02, 2020  Indication: Mood control   aspirin EC 81 MG tablet Take 81 mg by mouth daily. Swallow whole.  Indication: Heart health   diltiazem 240 MG 24 hr capsule Commonly known as: CARDIZEM CD Take 1 capsule (240 mg total) by mouth daily.  Indication: High Blood Pressure Disorder   furosemide 20 MG tablet Commonly known as: LASIX Take 20 mg by mouth daily.  Indication: Edema, High Blood Pressure Disorder   hydrOXYzine 50 MG tablet Commonly known as: ATARAX/VISTARIL Take 1 tablet (50 mg total) by mouth 3 (three) times daily as needed for anxiety. What changed:   when to take this  reasons to take this  Indication: Feeling Anxious   levalbuterol 45 MCG/ACT inhaler Commonly known as: XOPENEX HFA Inhale 2 puffs into the lungs every 6 (six) hours as needed for wheezing or shortness of breath.  Indication: Reversible Disease of Blockage in Breathing Passages   nystatin powder Commonly known as: MYCOSTATIN/NYSTOP Apply topically 2 (two) times daily.  Indication: Skin Infection due to Candida Yeast   pantoprazole 40 MG tablet Commonly known as: PROTONIX Take 1 tablet (40 mg total) by mouth 2 (two) times daily. What changed:   when to take this  reasons to take this  Indication: Gastroesophageal Reflux Disease       Follow-up Information    [facility] Call.   Why: Please contact this provider to discuss establishing care for EMDR therapy services. Contact information: [address] [place] [address] [phone]        [facility] Go on 02/06/2020.   Specialty: Professional Counselor Why: Please go to this provider for therapy services during their  walk in hours for new patients:  Monday through Friday from 8:30 am to 12:00 pm and 1:00 pm to 2:30 pm (excluding holidays). Contact information: [facility] [place] [address] [place] [address] [phone]        [facility] Follow up on 02/14/2020.   Why: You have an appointment for medication management services on 02/14/20 at 6:10 pm.  This will be a Virtual appointment. Contact information: [address] [place] [address] [phone]              Follow-up recommendation: Activity:  As tolerated Diet: As recommended by your primary care doctor. Keep all scheduled follow-up appointments as recommended.  Comments: Prescriptions given at discharge.  Patient agreeable to plan.  Given opportunity to ask questions.  Appears to feel comfortable with discharge denies any current suicidal or homicidal thought. Patient is also instructed prior to discharge to: Take all medications as prescribed by his/her mental healthcare provider. Report any adverse effects and or reactions from the medicines to his/her outpatient provider promptly. Patient has been instructed & cautioned: To not engage in alcohol and or illegal drug use while on prescription medicines. In the event of worsening symptoms, patient is instructed to call the crisis hotline, 911 and or go to the nearest ED for appropriate evaluation and treatment of symptoms. To follow-up with his/her primary care provider for your other medical issues, concerns and or health care needs.  Signed: Armandina Stammer,  NP, PMHNP, FNP-BC 02/01/2020, 10:06 AM

## 2020-02-01 NOTE — Progress Notes (Signed)
Patient has been cooperative with treatment, she still has attention seeking behaviors and requires some redirection due to somatic complaints. Patient has been medication compliant on shift, urine stored for the lab.  Patient denies SI, HI, AVH at this time. PRN ATIvan given to patient x2 due to complaints of anxiety. She is currently in bed resting.

## 2020-02-01 NOTE — Tx Team (Signed)
lInterdisciplinary Treatment and Diagnostic Plan Update  02/01/2020 Time of Session: 9:00am Alyssa Blanchard MRN: [id number]  Principal Diagnosis: Bipolar 1 disorder (HCC)  Secondary Diagnoses: Principal Problem:   Bipolar 1 disorder (HCC)   Current Medications:  Current Facility-Administered Medications  Medication Dose Route Frequency Provider Last Rate Last Admin  . acetaminophen (TYLENOL) tablet 650 mg  650 mg Oral Q6H PRN Aldean Baker, NP   650 mg at 02/01/20 0923  . alum & mag hydroxide-simeth (MAALOX/MYLANTA) 200-200-20 MG/5ML suspension 30 mL  30 mL Oral Q4H PRN Aldean Baker, NP      . apixaban (ELIQUIS) tablet 5 mg  5 mg Oral BID Estella Husk, MD   5 mg at 02/01/20 0816  . ARIPiprazole (ABILIFY) tablet 5 mg  5 mg Oral Daily Aldean Baker, NP      . aspirin EC tablet 81 mg  81 mg Oral Daily Aldean Baker, NP   81 mg at 02/01/20 0816  . diltiazem (CARDIZEM CD) 24 hr capsule 240 mg  240 mg Oral Daily Aldean Baker, NP   240 mg at 02/01/20 0816  . furosemide (LASIX) tablet 20 mg  20 mg Oral Daily Aldean Baker, NP   20 mg at 02/01/20 0816  . hydrOXYzine (ATARAX/VISTARIL) tablet 50 mg  50 mg Oral TID PRN Aldean Baker, NP   50 mg at 02/01/20 0824  . levalbuterol (XOPENEX HFA) inhaler 2 puff  2 puff Inhalation Q6H PRN Aldean Baker, NP      . LORazepam (ATIVAN) tablet 1 mg  1 mg Oral Q4H PRN Cristofano, Worthy Rancher, MD   1 mg at 02/01/20 0121  . magnesium hydroxide (MILK OF MAGNESIA) suspension 30 mL  30 mL Oral Daily PRN Aldean Baker, NP      . nystatin (MYCOSTATIN/NYSTOP) topical powder   Topical BID Aldean Baker, NP   Given at 02/01/20 [id number]  . OLANZapine zydis (ZYPREXA) disintegrating tablet 10 mg  10 mg Oral Q8H PRN Estella Husk, MD       And  . ziprasidone (GEODON) injection 20 mg  20 mg Intramuscular PRN Estella Husk, MD      . pantoprazole (PROTONIX) EC tablet 40 mg  40 mg Oral BID Aldean Baker, NP   40 mg at 02/01/20 0816   PTA  Medications: Medications Prior to Admission  Medication Sig Dispense Refill Last Dose  . apixaban (ELIQUIS) 5 MG TABS tablet Take 1 tablet (5 mg total) by mouth 2 (two) times daily. 60 tablet 0   . aspirin EC 81 MG tablet Take 81 mg by mouth daily. Swallow whole.     . diltiazem (CARDIZEM CD) 240 MG 24 hr capsule Take 1 capsule (240 mg total) by mouth daily. 30 capsule 0   . furosemide (LASIX) 20 MG tablet Take 20 mg by mouth daily.     . hydrOXYzine (ATARAX/VISTARIL) 50 MG tablet Take 50 mg by mouth 2 (two) times daily.     Marland Kitchen levalbuterol (XOPENEX HFA) 45 MCG/ACT inhaler Inhale 2 puffs into the lungs every 6 (six) hours as needed for wheezing or shortness of breath.      . pantoprazole (PROTONIX) 40 MG tablet Take 1 tablet (40 mg total) by mouth 2 (two) times daily. (Patient taking differently: Take 40 mg by mouth daily as needed (Acid reflux). ) 60 tablet 3     Patient Stressors: Financial difficulties Health problems Medication change or noncompliance Occupational concerns  Substance abuse  Patient Strengths: Capable of independent living Barrister's clerk for treatment/growth Supportive family/friends  Treatment Modalities: Medication Management, Group therapy, Case management,  1 to 1 session with clinician, Psychoeducation, Recreational therapy.   Physician Treatment Plan for Primary Diagnosis: Bipolar 1 disorder (HCC) Long Term Goal(s): Improvement in symptoms so as ready for discharge Improvement in symptoms so as ready for discharge   Short Term Goals: Ability to identify changes in lifestyle to reduce recurrence of condition will improve Ability to verbalize feelings will improve Ability to disclose and discuss suicidal ideas Ability to demonstrate self-control will improve Ability to identify changes in lifestyle to reduce recurrence of condition will improve Ability to verbalize feelings will improve Ability to disclose and discuss suicidal ideas Ability  to demonstrate self-control will improve Ability to identify and develop effective coping behaviors will improve  Medication Management: Evaluate patient's response, side effects, and tolerance of medication regimen.  Therapeutic Interventions: 1 to 1 sessions, Unit Group sessions and Medication administration.  Evaluation of Outcomes: Adequate for Discharge  Physician Treatment Plan for Secondary Diagnosis: Principal Problem:   Bipolar 1 disorder (HCC)  Long Term Goal(s): Improvement in symptoms so as ready for discharge Improvement in symptoms so as ready for discharge   Short Term Goals: Ability to identify changes in lifestyle to reduce recurrence of condition will improve Ability to verbalize feelings will improve Ability to disclose and discuss suicidal ideas Ability to demonstrate self-control will improve Ability to identify changes in lifestyle to reduce recurrence of condition will improve Ability to verbalize feelings will improve Ability to disclose and discuss suicidal ideas Ability to demonstrate self-control will improve Ability to identify and develop effective coping behaviors will improve     Medication Management: Evaluate patient's response, side effects, and tolerance of medication regimen.  Therapeutic Interventions: 1 to 1 sessions, Unit Group sessions and Medication administration.  Evaluation of Outcomes: Adequate for Discharge   RN Treatment Plan for Primary Diagnosis: Bipolar 1 disorder (HCC) Long Term Goal(s): Knowledge of disease and therapeutic regimen to maintain health will improve  Short Term Goals: Ability to remain free from injury will improve, Ability to identify and develop effective coping behaviors will improve and Compliance with prescribed medications will improve  Medication Management: RN will administer medications as ordered by provider, will assess and evaluate patient's response and provide education to patient for prescribed  medication. RN will report any adverse and/or side effects to prescribing provider.  Therapeutic Interventions: 1 on 1 counseling sessions, Psychoeducation, Medication administration, Evaluate responses to treatment, Monitor vital signs and CBGs as ordered, Perform/monitor CIWA, COWS, AIMS and Fall Risk screenings as ordered, Perform wound care treatments as ordered.  Evaluation of Outcomes: Adequate for Discharge   LCSW Treatment Plan for Primary Diagnosis: Bipolar 1 disorder (HCC) Long Term Goal(s): Safe transition to appropriate next level of care at discharge, Engage patient in therapeutic group addressing interpersonal concerns.  Short Term Goals: Engage patient in aftercare planning with referrals and resources, Increase social support, Identify triggers associated with mental health/substance abuse issues and Increase skills for wellness and recovery  Therapeutic Interventions: Assess for all discharge needs, 1 to 1 time with Social worker, Explore available resources and support systems, Assess for adequacy in community support network, Educate family and significant other(s) on suicide prevention, Complete Psychosocial Assessment, Interpersonal group therapy.  Evaluation of Outcomes: Adequate for Discharge   Progress in Treatment: Attending groups: No. Participating in groups: No. Taking medication as prescribed: No. Toleration medication: Yes. Family/Significant  other contact made: Yes, individual(s) contacted:  mother Patient understands diagnosis: Yes. Discussing patient identified problems/goals with staff: Yes. Medical problems stabilized or resolved: Yes. Denies suicidal/homicidal ideation: Yes. Issues/concerns per patient self-inventory: No.   New problem(s) identified: No, Describe:  none  New Short Term/Long Term Goal(s): medication stabilization, elimination of SI thoughts, development of comprehensive mental wellness plan.   Patient Goals:  "Learn to take better  care of myself. Self-love."  Discharge Plan or Barriers: Patient is to return to stay at the [facility] for a short time period. Patient is to follow up with [facility] for medication management and [facility] for therapy.   Reason for Continuation of Hospitalization: Medication stabilization  Estimated Length of Stay: 1-3 days  Attendees: Patient: Alyssa Blanchard 02/01/2020   Physician: Earlene Plater, MD 02/01/2020   Nursing:  02/01/2020   RN Care Manager: 02/01/2020   Social Worker: Ruthann Cancer, LCSW 02/01/2020   Recreational Therapist:  02/01/2020   Other:  02/01/2020   Other:  02/01/2020   Other: 02/01/2020     Scribe for Treatment Team: Otelia Santee, LCSW 02/01/2020 10:30 AM

## 2020-02-01 NOTE — Plan of Care (Signed)
Pt is being discharged from hospital.    Caroll Rancher, LRT/CTRS

## 2020-02-01 NOTE — Progress Notes (Signed)
RN met with pt and reviewed pt's discharge instructions. Pt verbalized understanding of discharge instructions and pt did not have any questions. RN reviewed and provided pt with a copy of SRA, AVS and Transition Record. RN returned pt's belongings to pt.  Paper prescriptions were given to pt. Pt denied SI/HI/AVH and voiced no concerns. Pt was appreciative of the care pt received at [facility]. Patient discharged to the lobby without incident.  ?

## 2020-02-01 NOTE — [facility] Counselor (Signed)
CSW provided hotel information for this patient to review.   Ruthann Cancer MSW, LCSW Clincal Social Worker  [facility]

## 2020-02-01 NOTE — [facility] Suicide Risk Assessment (Signed)
Medical Center Discharge Suicide Risk Assessment   Principal Problem: Bipolar 1 disorder [facility]) Discharge Diagnoses: Principal Problem:   Bipolar 1 disorder (HCC)   Total Time spent with patient: 20 minutes  Musculoskeletal: Strength & Muscle Tone: within normal limits Gait & Station: ambulates with walker Patient leans: N/A  Psychiatric Specialty Exam: Review of Systems  Blood pressure 140/68, pulse 92, temperature 99 F (37.2 C), temperature source Oral, resp. rate 20, height 5\' 4"  (1.626 m), weight (!) 187.8 kg, SpO2 99 %.Body mass index is 71.06 kg/m.  General Appearance: Casual and Fairly Groomed  Eye Contact::  Good  Speech:  Clear and Coherent and Normal Rate409  Volume:  Normal  Mood:  "okay"  Affect:  Appropriate, Congruent and euthymic  Thought Process:  Coherent and Linear  Orientation:  Full (Time, Place, and Person)  Thought Content:  WDL  Suicidal Thoughts:  No  Homicidal Thoughts:  No  Memory:  Immediate;   Good Recent;   Good  Judgement:  Fair  Insight:  Present and limited  Psychomotor Activity:  Normal  Concentration:  Good  Recall:  Good  Fund of Knowledge:Good  Language: Good  Akathisia:  No  Handed:  Right  AIMS (if indicated):     Assets:  Communication Skills Desire for Improvement Resilience Social Support  Sleep:  Number of Hours: 1  Cognition: WNL  ADL's:  Intact   Mental Status Per Nursing Assessment::   On Admission:  NA  Demographic Factors:  Caucasian, Low socioeconomic status and Unemployed  Loss Factors: Decrease in vocational status, Loss of significant relationship, Decline in physical health and Financial problems/change in socioeconomic status  Historical Factors: Impulsivity  Risk Reduction Factors:   Living with another person, especially a relative, Positive social support and Positive coping skills or problem solving skills  Continued Clinical Symptoms:  Previous Psychiatric Diagnoses and Treatments Medical Diagnoses and  Treatments/Surgeries  Cognitive Features That Contribute To Risk:  None    Suicide Risk:  Minimal: No identifiable suicidal ideation.  Patients presenting with no risk factors but with morbid ruminations; may be classified as minimal risk based on the severity of the depressive symptoms   Follow-up Information    [facility] Call.   Why: Please contact this provider to discuss establishing care for EMDR therapy services. Contact information: [address] 05-01-2005 [place] [phone]        [facility] Go on 02/06/2020.   Specialty: Professional Counselor Why: Please go to this provider for therapy services during their walk in hours for new patients:  Monday through Friday from 8:30 am to 12:00 pm and 1:00 pm to 2:30 pm (excluding holidays). Contact information: [facility] [address] [place] [phone]        [facility] Follow up on 02/14/2020.   Why: You have an appointment for medication management services on 02/14/20 at 6:10 pm.  This will be a Virtual appointment. Contact information: [address] [place] [phone]               Plan Of Care/Follow-up recommendations:  Activity:  as tolerated Diet:  regular Other:      Patient is instructed prior to discharge to: Take all medications as prescribed by his/her mental healthcare provider. Report any adverse effects and or reactions from the medicines to his/her outpatient provider promptly. Patient has been instructed & cautioned: To not  engage in alcohol and or illegal drug use while on prescription medicines. In the event of worsening symptoms, patient is instructed to call the crisis hotline, 911 and or go to the nearest ED for appropriate evaluation and treatment of symptoms. To follow-up with his/her primary care provider for your other medical issues, concerns and  or health care needs.     Estella Husk, MD 02/01/2020, 10:12 AM

## 2020-02-01 NOTE — Progress Notes (Signed)
°  [facility] Adult Case Management Discharge Plan :  Will you be returning to the same living situation after discharge:  No. Will be staying in hotel  At discharge, do you have transportation home?: No. Safe Transport to be scheduled Do you have the ability to pay for your medications: Yes,  has insurance  Release of information consent forms completed and in the chart;  Patient's signature needed at discharge.  Patient to Follow up at:  Follow-up Information    [facility] Call.   Why: Please contact this provider to discuss establishing care for EMDR therapy services. Contact information: [address] [place] [address] [phone]        [facility] Go on 02/06/2020.   Specialty: Professional Counselor Why: Please go to this provider for therapy services during their walk in hours for new patients:  Monday through Friday from 8:30 am to 12:00 pm and 1:00 pm to 2:30 pm (excluding holidays). Contact information: [facility] [place] [address] [place] [address] [phone]        [facility] Follow up on 02/14/2020.   Why: You have an appointment for medication management services on 02/14/20 at 6:10 pm.  This will be a Virtual appointment. Contact information: [address] [place] [address] [phone]               Next level of care provider has access to [facility] Link:no  Safety Planning and Suicide Prevention discussed: Yes,  with mother  Have you used any form of tobacco in the last 30 days? (Cigarettes, Smokeless Tobacco, Cigars, and/or Pipes): No  Has patient been referred to the Quitline?: N/A patient is not a smoker  Patient has been referred for addiction treatment: Pt. refused referral  Otelia Santee, LCSW 02/01/2020, 10:16 AM

## 2020-02-01 NOTE — Progress Notes (Signed)
Recreation Therapy Notes  INPATIENT RECREATION TR PLAN  Patient Details Name: Alyssa Blanchard MRN: [id number] DOB: [birth year] Today's Date: 02/01/2020  Rec Therapy Plan Is patient appropriate for Therapeutic Recreation?: Yes Treatment times per week: about 3 days Estimated Length of Stay: 5-7 days TR Treatment/Interventions: Group participation (Comment)  Discharge Criteria Pt will be discharged from therapy if:: Discharged Treatment plan/goals/alternatives discussed and agreed upon by:: Patient/family  Discharge Summary Short term goals set: See patient care plan Short term goals met: Not met Reason goals not met: Pt is being discharged. Therapeutic equipment acquired: N/A Reason patient discharged from therapy: Discharge from hospital Pt/family agrees with progress & goals achieved: Yes Date patient discharged from therapy: 02/01/20   Victorino Sparrow, LRT/CTRS  Ria Comment, Averyana Pillars A 02/01/2020, 12:00 PM

## 2020-02-01 NOTE — Progress Notes (Signed)
Did not attend group 

## 2020-02-01 NOTE — [facility] Group Notes (Signed)
LCSW Group Therapy Note  Type of Therapy/Topic: Group Therapy: Feelings about Diagnosis  Participation Level: Active  Description of Group: This group will allow patients to explore their thoughts and feelings about diagnoses they have received. Patients will be guided to explore their level of understanding and acceptance of these diagnoses. Facilitator will encourage patients to process their thoughts and feelings about the reactions of others to their diagnosis and will guide patients in identifying ways to discuss their diagnosis with significant others in their lives. This group will be process-oriented, with patients participating in exploration of their own experiences, giving and receiving support, and processing challenge from other group members.  Therapeutic Goals: 1. Patient will demonstrate understanding of diagnosis as evidenced by identifying two or more symptoms of the disorder 2. Patient will be able to express two feelings regarding the diagnosis 3. Patient will demonstrate their ability to communicate their needs through discussion and/or role play  Summary of Patient Progress: Patient attended and participated in group. Patient shared that she feels that people with mental health concerns often get "swept under the rug" and are made to look and seem "crazy"  Therapeutic Modalities: Cognitive Behavioral Therapy Brief Therapy Feelings Identification  Ruthann Cancer MSW, LCSW Clincal Social Worker  [facility]

## 2020-02-01 NOTE — [facility] Suicide Risk Assessment (Signed)
[facility] INPATIENT:  Family/Significant Other Suicide Prevention Education  Suicide Prevention Education: Education Completed; Mother, Livia Tarr [phone]), has been identified by the patient as the family member/significant other with whom the patient will be residing, and identified as the person(s) who will aid the patient in the event of a mental health crisis (suicidal ideations/suicide attempt).  With written consent from the patient, the family member/significant other has been provided the following suicide prevention education, prior to the and/or following the discharge of the patient.  The suicide prevention education provided includes the following:  Suicide risk factors  Suicide prevention and interventions  National Suicide Hotline telephone number  [facility] assessment telephone number  [facility] Emergency Assistance 911  [facility] and/or Residential Mobile Crisis Unit telephone number   Request made of family/significant other to:  Remove weapons (e.g., guns, rifles, knives), all items previously/currently identified as safety concern.    Remove drugs/medications (over-the-counter, prescriptions, illicit drugs), all items previously/currently identified as a safety concern.   The family member/significant other verbalizes understanding of the suicide prevention education information provided.  The family member/significant other agrees to remove the items of safety concern listed above.  CSW spoke with this patients mother who stated that she has no safety concerns with this patient discharging at this time. Per mother this patient told her that her car was at hospital and did not tell her mother she was staying at the [facility]. Patients mother stated that she is unable to afford staying at the [place] and believes she has become obsessed with this hotel due to her late father doing work on this hotel in the past.    Ruthann Cancer MSW,  LCSW Clincal Social Worker  [facility]

## 2021-03-16 DIAGNOSIS — U071 COVID-19: Secondary | ICD-10-CM | POA: Diagnosis not present

## 2021-04-02 DIAGNOSIS — L304 Erythema intertrigo: Secondary | ICD-10-CM | POA: Diagnosis not present

## 2021-04-19 DIAGNOSIS — Z6841 Body Mass Index (BMI) 40.0 and over, adult: Secondary | ICD-10-CM | POA: Diagnosis not present

## 2021-04-19 DIAGNOSIS — L304 Erythema intertrigo: Secondary | ICD-10-CM | POA: Diagnosis not present

## 2021-04-19 DIAGNOSIS — I4891 Unspecified atrial fibrillation: Secondary | ICD-10-CM | POA: Diagnosis not present

## 2021-04-19 DIAGNOSIS — I499 Cardiac arrhythmia, unspecified: Secondary | ICD-10-CM | POA: Diagnosis not present

## 2021-04-25 DIAGNOSIS — L98492 Non-pressure chronic ulcer of skin of other sites with fat layer exposed: Secondary | ICD-10-CM | POA: Diagnosis not present

## 2021-04-25 DIAGNOSIS — L304 Erythema intertrigo: Secondary | ICD-10-CM | POA: Diagnosis not present

## 2021-04-26 DIAGNOSIS — L98499 Non-pressure chronic ulcer of skin of other sites with unspecified severity: Secondary | ICD-10-CM | POA: Diagnosis not present

## 2021-05-02 DIAGNOSIS — L304 Erythema intertrigo: Secondary | ICD-10-CM | POA: Diagnosis not present

## 2021-05-02 DIAGNOSIS — L98492 Non-pressure chronic ulcer of skin of other sites with fat layer exposed: Secondary | ICD-10-CM | POA: Diagnosis not present

## 2021-05-09 DIAGNOSIS — R053 Chronic cough: Secondary | ICD-10-CM | POA: Diagnosis not present

## 2021-05-27 DIAGNOSIS — L304 Erythema intertrigo: Secondary | ICD-10-CM | POA: Diagnosis not present

## 2021-05-27 DIAGNOSIS — L98492 Non-pressure chronic ulcer of skin of other sites with fat layer exposed: Secondary | ICD-10-CM | POA: Diagnosis not present

## 2021-06-21 DIAGNOSIS — I480 Paroxysmal atrial fibrillation: Secondary | ICD-10-CM | POA: Diagnosis not present

## 2021-06-21 DIAGNOSIS — I5043 Acute on chronic combined systolic (congestive) and diastolic (congestive) heart failure: Secondary | ICD-10-CM | POA: Diagnosis not present

## 2021-06-21 DIAGNOSIS — Z6841 Body Mass Index (BMI) 40.0 and over, adult: Secondary | ICD-10-CM | POA: Diagnosis not present

## 2021-06-21 DIAGNOSIS — R42 Dizziness and giddiness: Secondary | ICD-10-CM | POA: Diagnosis not present

## 2021-06-21 DIAGNOSIS — K760 Fatty (change of) liver, not elsewhere classified: Secondary | ICD-10-CM | POA: Diagnosis not present

## 2021-06-21 DIAGNOSIS — R0789 Other chest pain: Secondary | ICD-10-CM | POA: Diagnosis not present

## 2021-06-21 DIAGNOSIS — Z9189 Other specified personal risk factors, not elsewhere classified: Secondary | ICD-10-CM | POA: Diagnosis not present

## 2021-06-21 DIAGNOSIS — J4540 Moderate persistent asthma, uncomplicated: Secondary | ICD-10-CM | POA: Diagnosis not present

## 2021-07-10 DIAGNOSIS — J45909 Unspecified asthma, uncomplicated: Secondary | ICD-10-CM | POA: Diagnosis not present

## 2021-07-10 DIAGNOSIS — L299 Pruritus, unspecified: Secondary | ICD-10-CM | POA: Diagnosis not present

## 2021-07-10 DIAGNOSIS — R0683 Snoring: Secondary | ICD-10-CM | POA: Diagnosis not present

## 2021-07-10 DIAGNOSIS — Z6841 Body Mass Index (BMI) 40.0 and over, adult: Secondary | ICD-10-CM | POA: Diagnosis not present

## 2021-07-10 DIAGNOSIS — G4719 Other hypersomnia: Secondary | ICD-10-CM | POA: Diagnosis not present

## 2021-07-10 DIAGNOSIS — I480 Paroxysmal atrial fibrillation: Secondary | ICD-10-CM | POA: Diagnosis not present

## 2021-07-17 DIAGNOSIS — D649 Anemia, unspecified: Secondary | ICD-10-CM | POA: Diagnosis not present

## 2021-07-17 DIAGNOSIS — J984 Other disorders of lung: Secondary | ICD-10-CM | POA: Diagnosis not present

## 2021-07-18 DIAGNOSIS — Z6841 Body Mass Index (BMI) 40.0 and over, adult: Secondary | ICD-10-CM | POA: Diagnosis not present

## 2021-07-18 DIAGNOSIS — R21 Rash and other nonspecific skin eruption: Secondary | ICD-10-CM | POA: Diagnosis not present

## 2021-07-18 DIAGNOSIS — L299 Pruritus, unspecified: Secondary | ICD-10-CM | POA: Diagnosis not present

## 2021-07-18 DIAGNOSIS — D509 Iron deficiency anemia, unspecified: Secondary | ICD-10-CM | POA: Diagnosis not present

## 2021-07-18 DIAGNOSIS — E119 Type 2 diabetes mellitus without complications: Secondary | ICD-10-CM | POA: Diagnosis not present

## 2021-07-18 DIAGNOSIS — G4701 Insomnia due to medical condition: Secondary | ICD-10-CM | POA: Diagnosis not present

## 2021-08-20 ENCOUNTER — Ambulatory Visit: Payer: BLUE CROSS/BLUE SHIELD | Admitting: Emergency Medicine

## 2021-10-03 ENCOUNTER — Other Ambulatory Visit (HOSPITAL_BASED_OUTPATIENT_CLINIC_OR_DEPARTMENT_OTHER): Payer: Self-pay

## 2021-10-03 DIAGNOSIS — G4710 Hypersomnia, unspecified: Secondary | ICD-10-CM

## 2021-10-03 DIAGNOSIS — R0683 Snoring: Secondary | ICD-10-CM

## 2021-10-03 DIAGNOSIS — R5383 Other fatigue: Secondary | ICD-10-CM

## 2021-11-08 ENCOUNTER — Encounter (HOSPITAL_BASED_OUTPATIENT_CLINIC_OR_DEPARTMENT_OTHER): Payer: Self-pay | Admitting: Internal Medicine

## 2021-12-17 ENCOUNTER — Encounter (HOSPITAL_BASED_OUTPATIENT_CLINIC_OR_DEPARTMENT_OTHER): Payer: Self-pay | Admitting: Internal Medicine

## 2021-12-18 ENCOUNTER — Ambulatory Visit: Payer: Self-pay | Admitting: Emergency Medicine

## 2021-12-25 DIAGNOSIS — E119 Type 2 diabetes mellitus without complications: Secondary | ICD-10-CM

## 2021-12-27 ENCOUNTER — Telehealth: Payer: Self-pay | Admitting: Hematology and Oncology

## 2021-12-27 NOTE — Telephone Encounter (Signed)
Scheduled appt per 10/20 referral. Pt is aware of appt date and time. Pt is aware to arrive 15 mins prior to appt time and to bring and updated insurance card. Pt is aware of appt location.   

## 2022-01-10 ENCOUNTER — Telehealth: Payer: Self-pay | Admitting: Hematology and Oncology

## 2022-01-10 NOTE — Telephone Encounter (Signed)
Scheduled appointment per 11/03 referral. Patient is aware of appointment date and time. Patient is aware to arrive 15 mins prior to appointment time and to bring updated insurance cards. Patient is aware of location.   

## 2022-01-15 ENCOUNTER — Encounter: Payer: Medicaid Other | Admitting: Physician Assistant

## 2022-01-15 ENCOUNTER — Other Ambulatory Visit: Payer: Medicaid Other

## 2022-01-28 ENCOUNTER — Inpatient Hospital Stay: Payer: Self-pay | Attending: Hematology and Oncology | Admitting: Physician Assistant

## 2022-01-28 ENCOUNTER — Encounter: Payer: Self-pay | Admitting: Physician Assistant

## 2022-01-28 ENCOUNTER — Inpatient Hospital Stay: Payer: Self-pay

## 2022-01-28 VITALS — BP 122/65 | HR 75 | Temp 97.9°F | Resp 20 | Ht 64.0 in | Wt >= 6400 oz

## 2022-01-28 DIAGNOSIS — I4891 Unspecified atrial fibrillation: Secondary | ICD-10-CM | POA: Insufficient documentation

## 2022-01-28 DIAGNOSIS — D509 Iron deficiency anemia, unspecified: Secondary | ICD-10-CM

## 2022-01-28 DIAGNOSIS — Z79899 Other long term (current) drug therapy: Secondary | ICD-10-CM | POA: Insufficient documentation

## 2022-01-28 DIAGNOSIS — D508 Other iron deficiency anemias: Secondary | ICD-10-CM

## 2022-01-28 DIAGNOSIS — I509 Heart failure, unspecified: Secondary | ICD-10-CM | POA: Insufficient documentation

## 2022-01-28 DIAGNOSIS — Z9884 Bariatric surgery status: Secondary | ICD-10-CM | POA: Insufficient documentation

## 2022-01-28 DIAGNOSIS — Z7901 Long term (current) use of anticoagulants: Secondary | ICD-10-CM | POA: Insufficient documentation

## 2022-01-28 DIAGNOSIS — Z87891 Personal history of nicotine dependence: Secondary | ICD-10-CM | POA: Insufficient documentation

## 2022-01-28 DIAGNOSIS — Z993 Dependence on wheelchair: Secondary | ICD-10-CM | POA: Insufficient documentation

## 2022-01-28 LAB — CBC WITH DIFFERENTIAL (CANCER CENTER ONLY)
Abs Immature Granulocytes: 0.06 10*3/uL (ref 0.00–0.07)
Basophils Absolute: 0.1 10*3/uL (ref 0.0–0.1)
Basophils Relative: 1 %
Eosinophils Absolute: 0.4 10*3/uL (ref 0.0–0.5)
Eosinophils Relative: 5 %
HCT: 33.9 % — ABNORMAL LOW (ref 36.0–46.0)
Hemoglobin: 9.9 g/dL — ABNORMAL LOW (ref 12.0–15.0)
Immature Granulocytes: 1 %
Lymphocytes Relative: 16 %
Lymphs Abs: 1.3 10*3/uL (ref 0.7–4.0)
MCH: 25.7 pg — ABNORMAL LOW (ref 26.0–34.0)
MCHC: 29.2 g/dL — ABNORMAL LOW (ref 30.0–36.0)
MCV: 88.1 fL (ref 80.0–100.0)
Monocytes Absolute: 0.7 10*3/uL (ref 0.1–1.0)
Monocytes Relative: 9 %
Neutro Abs: 5.9 10*3/uL (ref 1.7–7.7)
Neutrophils Relative %: 68 %
Platelet Count: 240 10*3/uL (ref 150–400)
RBC: 3.85 MIL/uL — ABNORMAL LOW (ref 3.87–5.11)
RDW: 19.7 % — ABNORMAL HIGH (ref 11.5–15.5)
WBC Count: 8.5 10*3/uL (ref 4.0–10.5)
nRBC: 0 % (ref 0.0–0.2)

## 2022-01-28 LAB — CMP (CANCER CENTER ONLY)
ALT: 27 U/L (ref 0–44)
AST: 34 U/L (ref 15–41)
Albumin: 4.1 g/dL (ref 3.5–5.0)
Alkaline Phosphatase: 74 U/L (ref 38–126)
Anion gap: 8 (ref 5–15)
BUN: 13 mg/dL (ref 6–20)
CO2: 25 mmol/L (ref 22–32)
Calcium: 9 mg/dL (ref 8.9–10.3)
Chloride: 103 mmol/L (ref 98–111)
Creatinine: 1.03 mg/dL — ABNORMAL HIGH (ref 0.44–1.00)
GFR, Estimated: >60 mL/min (ref >60)
Glucose, Bld: 78 mg/dL (ref 70–99)
Potassium: 3.8 mmol/L (ref 3.5–5.1)
Sodium: 136 mmol/L (ref 135–145)
Total Bilirubin: 0.6 mg/dL (ref 0.3–1.2)
Total Protein: 9.1 g/dL — ABNORMAL HIGH (ref 6.5–8.1)

## 2022-01-28 LAB — RETIC PANEL
Immature Retic Fract: 25.6 % — ABNORMAL HIGH (ref 2.3–15.9)
RBC.: 3.87 MIL/uL (ref 3.87–5.11)
Retic Count, Absolute: 135.5 10*3/uL (ref 19.0–186.0)
Retic Ct Pct: 3.5 % — ABNORMAL HIGH (ref 0.4–3.1)
Reticulocyte Hemoglobin: 23.3 pg — ABNORMAL LOW (ref >27.9)

## 2022-01-28 LAB — VITAMIN B12: Vitamin B-12: 331 pg/mL (ref 180–914)

## 2022-01-28 NOTE — Progress Notes (Signed)
[facility] Telephone:([phone]   [phone]  INITIAL CONSULT NOTE  Patient Care Team: Pcp, No as PCP - General Chilton Si, MD as PCP - Cardiology (Cardiology)   CHIEF COMPLAINTS/PURPOSE OF CONSULTATION:  Iron deficiency anemia  HISTORY OF PRESENTING ILLNESS:  Alyssa Blanchard 51 y.o. female with medical history of CHF, atrial fibrillation on Eliquis, and morbid obesity presents to the hematology clinic for evaluation for iron deficiency anemia. She is unaccompanied for this visit.   On review of the previous records, [name] was recently hospitalized at [facility] from 01/12/2022 to 01/16/2022. Iron profile consistent with IDA: Transferrin 370, iron 32, TIBC 518, % saturation 6. She received IV iron infusion x 1 during hospitalization.   On exam today, [name] reports low energy levels and is in a wheelchair. She needs help to complete some ADLs. She reports intermittent episodes of nausea and vomiting that has been chronic since undergoing gastric sleeve surgery. She reports intermittent episodes of constipation and straining. She does have hematochezia even with normal bowel movements. 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 She reports shortness of breath secondary to CHF. She denies fevers, chills, sweats, chest pain or cough. She has no other complaints. Rest of the 10 point ROS is below.   MEDICAL HISTORY:  Past Medical History:  Diagnosis Date   Anxiety    Asthma    Depression    Dysrhythmia    new onset Afib rvr   GERD (gastroesophageal reflux disease)    Heel spur    bilat   History of bronchitis    History of chicken pox    History of urinary tract infection    Migraines    Plantar fasciitis    bilat   STD (sexually transmitted disease)    chl hx & hsv 1&2   Tremors of nervous system    Urinary incontinence     SURGICAL HISTORY: Past Surgical History:  Procedure Laterality  Date   CARDIOVERSION N/A 08/05/2019   Procedure: CARDIOVERSION;  Surgeon: Nahser, Deloris Ping, MD;  Location: [facility] ENDOSCOPY;  Service: Cardiovascular;  Laterality: N/A;   LAPAROSCOPIC GASTRIC SLEEVE RESECTION N/A 11/27/2014   Procedure: LAPAROSCOPIC GASTRIC SLEEVE RESECTION WITH HIATAL HERNIA REPAIR UPPER ENDOSCOPY;  Surgeon: Luretha Murphy, MD;  Location: [facility] ORS;  Service: General;  Laterality: N/A;   TEE WITHOUT CARDIOVERSION N/A 08/05/2019   Procedure: TRANSESOPHAGEAL ECHOCARDIOGRAM (TEE);  Surgeon: [name] Deloris Ping, MD;  Location: [facility] ENDOSCOPY;  Service: Cardiovascular;  Laterality: N/A;   TONSILLECTOMY  1978    SOCIAL HISTORY: Social History   Socioeconomic History   Marital status: Single    Spouse name: Not on file   Number of children: Not on file   Years of education: 12   Highest education level: Not on file  Occupational History   Occupation: Cosmetics    Employer: [employer]  Tobacco Use   Smoking status: Former    Packs/day: 0.25    Years: 5.00    Total pack years: 1.25    Types: Cigarettes    Quit date: 03/10/2012    Years since quitting: 9.8   Smokeless tobacco: Never  Vaping Use   Vaping Use: Never used  Substance and Sexual Activity   Alcohol use: Yes    Alcohol/week: 0.0 standard drinks of alcohol    Comment: 1 a month; "binge drinker"   Drug use: Yes    Types: Marijuana, Cocaine   Sexual  activity: Yes    Partners: Male    Birth control/protection: Condom  Other Topics Concern   Not on file  Social History Narrative   Regular exercise-no   Caffeine Use-yes, 1-2 cups of caffeine daily   Social Determinants of Health   Financial Resource Strain: Not on file  Food Insecurity: Not on file  Transportation Needs: Not on file  Physical Activity: Not on file  Stress: Not on file  Social Connections: Not on file  Intimate Partner Violence: Not on file    FAMILY HISTORY: Family History  Problem Relation Age of Onset   Diabetes Mother     Hypertension Mother    Hyperlipidemia Mother    Kidney disease Mother    Cancer Father        pancreatic   Hypertension Maternal Grandmother    Diabetes Maternal Grandmother    Heart failure Maternal Grandmother        CHF   Heart attack Maternal Grandfather    Hypertension Maternal Grandfather    Hypertension Paternal Grandmother    Alzheimer's disease Paternal Grandmother    Hypertension Paternal Grandfather    Cancer Maternal Uncle        melanoma   Cancer Paternal Uncle        kidney    ALLERGIES:  is allergic to gabapentin, topiramate, imitrex [sumatriptan], latex, montelukast, other, prozac [fluoxetine], trazodone and nefazodone, and copper-containing compounds.  MEDICATIONS:  Current Outpatient Medications  Medication Sig Dispense Refill   apixaban (ELIQUIS) 5 MG TABS tablet Take 1 tablet (5 mg total) by mouth 2 (two) times daily. 60 tablet 0   ARIPiprazole (ABILIFY) 5 MG tablet Take 1 tablet (5 mg total) by mouth daily. For mood control 30 tablet 0   aspirin EC 81 MG tablet Take 81 mg by mouth daily. Swallow whole.     diltiazem (CARDIZEM CD) 240 MG 24 hr capsule Take 1 capsule (240 mg total) by mouth daily. 30 capsule 0   furosemide (LASIX) 20 MG tablet Take 20 mg by mouth daily.     hydrOXYzine (ATARAX/VISTARIL) 50 MG tablet Take 1 tablet (50 mg total) by mouth 3 (three) times daily as needed for anxiety. 75 tablet 0   levalbuterol (XOPENEX HFA) 45 MCG/ACT inhaler Inhale 2 puffs into the lungs every 6 (six) hours as needed for wheezing or shortness of breath.      nystatin (MYCOSTATIN/NYSTOP) powder Apply topically 2 (two) times daily. 15 g 0   pantoprazole (PROTONIX) 40 MG tablet Take 1 tablet (40 mg total) by mouth 2 (two) times daily. (Patient taking differently: Take 40 mg by mouth daily as needed (Acid reflux). ) 60 tablet 3   No current facility-administered medications for this visit.    REVIEW OF SYSTEMS:   Constitutional: ( - ) fevers, ( - )  chills , ( - )  night sweats Eyes: ( - ) blurriness of vision, ( - ) double vision, ( - ) watery eyes Ears, nose, mouth, throat, and face: ( - ) mucositis, ( - ) sore throat Respiratory: ( - ) cough, ( + ) dyspnea, ( - ) wheezes Cardiovascular: ( - ) palpitation, ( - ) chest discomfort, ( - ) lower extremity swelling Gastrointestinal:  ( + ) nausea, ( - ) heartburn, ( + ) change in bowel habits Skin: ( - ) abnormal skin rashes Lymphatics: ( - ) new lymphadenopathy, ( - ) easy bruising Neurological: ( +) numbness, ( - ) tingling, ( - ) new  weaknesses Behavioral/Psych: ( - ) mood change, ( - ) new changes  All other systems were reviewed with the patient and are negative.  PHYSICAL EXAMINATION: ECOG PERFORMANCE STATUS: 2 - Symptomatic, <50% confined to bed  Vitals:   01/28/22 1512  BP: 122/65  Pulse: 75  Resp: 20  Temp: 97.9 F (36.6 C)  SpO2: 97%   Filed Weights   01/28/22 1512  Weight: (!) 477 lb 12.8 oz (216.7 kg)    GENERAL: well appearing female in NAD, obese in a wheelchair SKIN: skin color, texture, turgor are normal, no rashes or significant lesions EYES: conjunctiva are pink and non-injected, sclera clear OROPHARYNX: no exudate, no erythema; lips, buccal mucosa, and tongue normal  NECK: supple, non-tender LUNGS: clear to auscultation and percussion with normal breathing effort HEART: regular rate & rhythm and no murmurs and no lower extremity edema Musculoskeletal: no cyanosis of digits and no clubbing  PSYCH: alert & oriented x 3, fluent speech NEURO: no focal motor/sensory deficits  LABORATORY DATA:  I have reviewed the data as listed    Latest Ref Rng & Units 01/31/2020    6:14 PM 01/26/2020    1:23 PM 08/05/2019    4:01 AM  CBC  WBC 4.0 - 10.5 K/uL 8.6  9.8  8.5   Hemoglobin 12.0 - 15.0 g/dL 09.811.5  11.911.6  14.711.2   Hematocrit 36.0 - 46.0 % 37.1  37.9  37.1   Platelets 150 - 400 K/uL 278  282  273        Latest Ref Rng & Units 01/31/2020    6:14 PM 01/26/2020    1:23 PM  08/05/2019    4:01 AM  CMP  Glucose 70 - 99 mg/dL 829106  562125  130123   BUN 6 - 20 mg/dL 13  15  12    Creatinine 0.44 - 1.00 mg/dL 8.650.91  7.841.10  6.960.92   Sodium 135 - 145 mmol/L 136  138  138   Potassium 3.5 - 5.1 mmol/L 3.2  4.2  4.1   Chloride 98 - 111 mmol/L 100  104  105   CO2 22 - 32 mmol/L 24  18  24    Calcium 8.9 - 10.3 mg/dL 8.9  9.2  8.9   Total Protein 6.5 - 8.1 g/dL 7.8     Total Bilirubin 0.3 - 1.2 mg/dL 0.8     Alkaline Phos 38 - 126 U/L 92     AST 15 - 41 U/L 44     ALT 0 - 44 U/L 44        ASSESSMENT & PLAN Ibtisam Tharon AquasRenee Obara is a 51 y.o. female who presents to the hematology clinic for iron deficiency anemi.a She recently received IV iron infusion with hospitalization earlier in the month. We reviewed that likely etiology is secondary to malabsorption due to gastric sleeve surgery. Due to report of hematochezia, we will refer patient to gastroenterology to rule out GI bleed. Patient will proceed with laboratory evaluation today and we will consider additional IV iron infusion if needed.   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 --Refer to gastroenterology to rule out GI bleed due to episodes of hematochezia --Labs today to check CBC, CMP and iron panel --Consider IV iron to bolster iron levels based on today's labs --RTC in 3 months with labs  #H/O gastric sleeve surgery: --Need to check vitamin B12 levels as well   Orders Placed This Encounter  Procedures   CBC with Differential (  Cancer Center Only)    Standing Status:   Future    Standing Expiration Date:   01/28/2023   CMP (Cancer Center only)    Standing Status:   Future    Standing Expiration Date:   01/28/2023   Ferritin    Standing Status:   Future    Standing Expiration Date:   01/28/2023   Iron and Iron Binding Capacity ([facility] only)    Standing Status:   Future    Standing Expiration Date:   01/28/2023   Retic Panel    Standing Status:   Future    Standing  Expiration Date:   01/28/2023    All questions were answered. The patient knows to call the clinic with any problems, questions or concerns.  I have spent a total of 60 minutes minutes of face-to-face and non-face-to-face time, preparing to see the patient, obtaining and/or reviewing separately obtained history, performing a medically appropriate examination, counseling and educating the patient, ordering tests/procedures, referring and communicating with other health care professionals, documenting clinical information in the electronic health record, and care coordination.   Georga Kaufmann, PA-C Department of Hematology/Oncology [facility] at [facility] Phone: [phone]  Patient was seen with Dr. Leonides Schanz  I have read the above note and personally examined the patient. I agree with the assessment and plan as noted above.  Briefly Mrs. Delayna Spots is a 51 year old female who presents for evaluation of iron deficiency anemia.  At this time the etiology of her anemia appears to be secondary malabsorption due gastric sleeve, though she does have hematochezia concerning for GI bleed.  Will make referral to GI in order to help bolster her iron levels we recommend pursuing IV iron therapy.  Labs today show white blood cell count 8.6, hemoglobin 8.3, MCV 89.5, and platelets of 229.  Will plan to see the patient back approximate 4 to 6 weeks after last dose of IV iron in order sure it is adequately bolstering her levels.   Ulysees Barns, MD Department of Hematology/Oncology [facility] at [facility] Phone: [phone] Pager: [phone] Email: [email or web address]

## 2022-01-29 ENCOUNTER — Emergency Department (HOSPITAL_COMMUNITY): Payer: Medicaid Other

## 2022-01-29 ENCOUNTER — Other Ambulatory Visit: Payer: Self-pay

## 2022-01-29 ENCOUNTER — Emergency Department (HOSPITAL_COMMUNITY)
Admission: EM | Admit: 2022-01-29 | Discharge: 2022-01-29 | Disposition: A | Discharge status: Skilled Nursing Facility | Payer: Self-pay | Attending: Emergency Medicine | Admitting: Emergency Medicine

## 2022-01-29 ENCOUNTER — Encounter (HOSPITAL_COMMUNITY): Payer: Self-pay | Admitting: Emergency Medicine

## 2022-01-29 DIAGNOSIS — J45909 Unspecified asthma, uncomplicated: Secondary | ICD-10-CM | POA: Insufficient documentation

## 2022-01-29 DIAGNOSIS — Z7982 Long term (current) use of aspirin: Secondary | ICD-10-CM | POA: Insufficient documentation

## 2022-01-29 DIAGNOSIS — Z91040 Latex allergy status: Secondary | ICD-10-CM | POA: Insufficient documentation

## 2022-01-29 DIAGNOSIS — D649 Anemia, unspecified: Secondary | ICD-10-CM | POA: Insufficient documentation

## 2022-01-29 DIAGNOSIS — Z7901 Long term (current) use of anticoagulants: Secondary | ICD-10-CM | POA: Insufficient documentation

## 2022-01-29 DIAGNOSIS — I509 Heart failure, unspecified: Secondary | ICD-10-CM | POA: Insufficient documentation

## 2022-01-29 DIAGNOSIS — Z79899 Other long term (current) drug therapy: Secondary | ICD-10-CM | POA: Insufficient documentation

## 2022-01-29 DIAGNOSIS — Z7984 Long term (current) use of oral hypoglycemic drugs: Secondary | ICD-10-CM | POA: Insufficient documentation

## 2022-01-29 LAB — CBC WITH DIFFERENTIAL/PLATELET
Abs Immature Granulocytes: 0.06 10*3/uL (ref 0.00–0.07)
Basophils Absolute: 0 10*3/uL (ref 0.0–0.1)
Basophils Relative: 0 %
Eosinophils Absolute: 0.4 10*3/uL (ref 0.0–0.5)
Eosinophils Relative: 4 %
HCT: 28.9 % — ABNORMAL LOW (ref 36.0–46.0)
Hemoglobin: 8.1 g/dL — ABNORMAL LOW (ref 12.0–15.0)
Immature Granulocytes: 1 %
Lymphocytes Relative: 14 %
Lymphs Abs: 1.4 10*3/uL (ref 0.7–4.0)
MCH: 25.1 pg — ABNORMAL LOW (ref 26.0–34.0)
MCHC: 28 g/dL — ABNORMAL LOW (ref 30.0–36.0)
MCV: 89.5 fL (ref 80.0–100.0)
Monocytes Absolute: 0.9 10*3/uL (ref 0.1–1.0)
Monocytes Relative: 9 %
Neutro Abs: 7.2 10*3/uL (ref 1.7–7.7)
Neutrophils Relative %: 72 %
Platelets: 232 10*3/uL (ref 150–400)
RBC: 3.23 MIL/uL — ABNORMAL LOW (ref 3.87–5.11)
RDW: 19.7 % — ABNORMAL HIGH (ref 11.5–15.5)
WBC: 10 10*3/uL (ref 4.0–10.5)
nRBC: 0 % (ref 0.0–0.2)

## 2022-01-29 LAB — IRON AND IRON BINDING CAPACITY (CC-WL,HP ONLY)
Iron: 65 ug/dL (ref 28–170)
Saturation Ratios: 13 % (ref 10.4–31.8)
TIBC: 503 ug/dL — ABNORMAL HIGH (ref 250–450)
UIBC: 438 ug/dL (ref 148–442)

## 2022-01-29 LAB — COMPREHENSIVE METABOLIC PANEL
ALT: 21 U/L (ref 0–44)
AST: 26 U/L (ref 15–41)
Albumin: 3.1 g/dL — ABNORMAL LOW (ref 3.5–5.0)
Alkaline Phosphatase: 62 U/L (ref 38–126)
Anion gap: 11 (ref 5–15)
BUN: 11 mg/dL (ref 6–20)
CO2: 23 mmol/L (ref 22–32)
Calcium: 8.7 mg/dL — ABNORMAL LOW (ref 8.9–10.3)
Chloride: 103 mmol/L (ref 98–111)
Creatinine, Ser: 1.1 mg/dL — ABNORMAL HIGH (ref 0.44–1.00)
GFR, Estimated: >60 mL/min (ref >60)
Glucose, Bld: 152 mg/dL — ABNORMAL HIGH (ref 70–99)
Potassium: 3.6 mmol/L (ref 3.5–5.1)
Sodium: 137 mmol/L (ref 135–145)
Total Bilirubin: 0.5 mg/dL (ref 0.3–1.2)
Total Protein: 6.8 g/dL (ref 6.5–8.1)

## 2022-01-29 LAB — BRAIN NATRIURETIC PEPTIDE: B Natriuretic Peptide: 129.6 pg/mL — ABNORMAL HIGH (ref 0.0–100.0)

## 2022-01-29 LAB — TROPONIN I (HIGH SENSITIVITY): Troponin I (High Sensitivity): 5 ng/L (ref <18)

## 2022-01-29 LAB — MAGNESIUM: Magnesium: 1.6 mg/dL — ABNORMAL LOW (ref 1.7–2.4)

## 2022-01-29 LAB — FERRITIN: Ferritin: 25 ng/mL (ref 11–307)

## 2022-01-29 MED ORDER — FUROSEMIDE 10 MG/ML IJ SOLN
40.0000 mg | Freq: Once | INTRAMUSCULAR | Status: AC
  Administered 2022-01-29: 40 mg via INTRAVENOUS
  Filled 2022-01-29: qty 4

## 2022-01-29 MED ORDER — MAGNESIUM SULFATE 2 GM/50ML IV SOLN
2.0000 g | Freq: Once | INTRAVENOUS | Status: AC
  Administered 2022-01-29: 2 g via INTRAVENOUS
  Filled 2022-01-29: qty 50

## 2022-01-29 MED ORDER — FUROSEMIDE 40 MG PO TABS: 80.0000 mg | ORAL_TABLET | Freq: Every day | ORAL | 0 refills | Status: DC

## 2022-01-29 NOTE — ED Notes (Signed)
[facility] called 

## 2022-01-29 NOTE — ED Provider Notes (Signed)
[facility] Provider Note   CSN: [id number] Arrival date & time: 01/29/22  0008     History  Chief Complaint  Patient presents with   Shortness of Breath    Alyssa Blanchard is a 51 y.o. female.  The history is provided by the patient.  Shortness of Breath She has history of asthma, atrial fibrillation anticoagulated on apixaban, heart failure who was [facility] and has been at [facility] since then.  She states that she has gained 25 pounds and has been having increasing dyspnea for the last 5 days.  Dyspnea is worse when she lays flat.  She does have some occasional, sharp chest pains but these are fleeting.  No chest pressure, heaviness, tightness.  She denies any cough or fever.  She denies any nausea, vomiting, diaphoresis.  She says that she was given some furosemide, but she thinks she has retained too much fluid for the furosemide she was given at the nursing home.   Home Medications Prior to Admission medications   Medication Sig Start Date End Date Taking? Authorizing Provider  Acetylcysteine 600 MG CAPS Take by mouth. 01/10/22   [provider]  apixaban (ELIQUIS) 5 MG TABS tablet Take 1 tablet (5 mg total) by mouth 2 (two) times daily. 08/05/19 01/26/20  Jae DireSegal, Jared E, MD  apixaban (ELIQUIS) 5 MG TABS tablet Take by mouth. 12/10/21   [provider]  ARIPiprazole (ABILIFY) 5 MG tablet Take 1 tablet (5 mg total) by mouth daily. For mood control 02/02/20   Armandina StammerNwoko, Agnes I, NP  aspirin EC 81 MG tablet Take 81 mg by mouth daily. Swallow whole.    [provider]  busPIRone (BUSPAR) 10 MG tablet TAKE 1 TABLET BY MOUTH TWICE A DAY *CAN TAKE 1/2 TABLET DURING THE DAY IF NEEDED 01/23/22   [provider]  diltiazem (CARDIZEM CD) 240 MG 24 hr capsule Take 1 capsule (240 mg total) by mouth daily. 08/06/19 01/25/21  Jae DireSegal, Jared E, MD  dofetilide  Joice Lofts(TIKOSYN) 500 MCG capsule Take by mouth. 01/17/22   [provider]  fluticasone furoate-vilanterol (BREO ELLIPTA) 200-25 MCG/ACT AEPB Inhale 1 puff into the lungs daily. 12/17/21   [provider]  furosemide (LASIX) 40 MG tablet Take 40 mg by mouth daily. Total of 60 mg PO daily 01/18/20   [provider]  glipiZIDE (GLUCOTROL) 10 MG tablet Take by mouth. 12/02/21 03/02/22  [provider]  hydrOXYzine (ATARAX/VISTARIL) 50 MG tablet Take 1 tablet (50 mg total) by mouth 3 (three) times daily as needed for anxiety. 02/01/20   Armandina StammerNwoko, Agnes I, NP  iron polysaccharides (NIFEREX) 150 MG capsule Take 150 mg by mouth daily.    [provider]  levalbuterol Pauline Aus(XOPENEX HFA) 45 MCG/ACT inhaler Inhale 2 puffs into the lungs every 6 (six) hours as needed for wheezing or shortness of breath.  Patient not taking: Reported on 01/28/2022 06/28/19   [provider]  losartan (COZAAR) 100 MG tablet Take 100 mg by mouth daily.    [provider]  nystatin (MYCOSTATIN/NYSTOP) powder Apply topically 2 (two) times daily. 02/01/20   Armandina StammerNwoko, Agnes I, NP  pantoprazole (PROTONIX) 40 MG tablet Take 1 tablet (40 mg total) by mouth 2 (two) times daily. Patient taking differently: Take 40 mg by mouth daily as needed (Acid reflux). 04/20/19   Malvin JohnsFarah, Brian, MD  polyethylene glycol powder [facility](GLYCOLAX/MIRALAX) 17 GM/SCOOP powder Take by mouth. 01/10/22  [provider]  potassium chloride SA (KLOR-CON M) 20 MEQ tablet Take by mouth. 01/11/22   [provider]  pregabalin (LYRICA) 100 MG capsule  01/27/22   [provider]  promethazine (PHENERGAN) 12.5 MG tablet Take by mouth. 01/12/22   [provider]  propranolol (INDERAL) 80 MG tablet Take by mouth. 01/19/22 04/19/22  [provider]  silver sulfADIAZINE (SILVADENE) 1 % cream Apply topically.    [provider]  simethicone (MYLICON) 80 MG chewable tablet Chew by mouth.  01/10/22   [provider]  spironolactone (ALDACTONE) 25 MG tablet Take 0.5 tablets by mouth daily. 01/11/22   [provider]  triamcinolone cream (KENALOG) 0.1 % Apply 1 Application topically 2 (two) times daily.    [provider]  Vitamin D, Ergocalciferol, (DRISDOL) 1.25 MG (50000 UNIT) CAPS capsule Take 50,000 Units by mouth once a week. 01/16/22   [provider]  VOLTAREN ARTHRITIS PAIN 1 % GEL Apply topically. 01/20/22   [provider]      Allergies    Gabapentin, Topiramate, Advair diskus [fluticasone-salmeterol], Imitrex [sumatriptan], Latex, Montelukast, Other, Prozac [fluoxetine], Trazodone and nefazodone, and Copper-containing compounds    Review of Systems   Review of Systems  Respiratory:  Positive for shortness of breath.   All other systems reviewed and are negative.   Physical Exam Updated Vital Signs Pulse 70   Temp 97.9 F (36.6 C)   Resp 15   SpO2 96%  Physical Exam Vitals and nursing note reviewed.   Morbidly obese 51 year old female, resting comfortably and in no acute distress. Vital signs are normal. Oxygen saturation is 96%, which is normal. Head is normocephalic and atraumatic. PERRLA, EOMI. Oropharynx is clear. Neck is nontender and supple without adenopathy or JVD. Back is nontender and there is no CVA tenderness. Lungs are clear without rales, wheezes, or rhonchi. Chest is nontender. Heart has regular rate and rhythm without murmur. Abdomen is soft, flat, nontender. Extremities have 1+ edema, full range of motion is present. Skin is warm and dry without rash. Neurologic: Mental status is normal, cranial nerves are intact, t moves all extremities equally.  ED Results / Procedures / Treatments   Labs (all labs ordered are listed, but only abnormal results are displayed) Labs Reviewed  BRAIN NATRIURETIC PEPTIDE - Abnormal; Notable for the following components:      Result Value   B Natriuretic Peptide  129.6 (*)    All other components within normal limits  CBC WITH DIFFERENTIAL/PLATELET - Abnormal; Notable for the following components:   RBC 3.23 (*)    Hemoglobin 8.1 (*)    HCT 28.9 (*)    MCH 25.1 (*)    MCHC 28.0 (*)    RDW 19.7 (*)    All other components within normal limits  COMPREHENSIVE METABOLIC PANEL - Abnormal; Notable for the following components:   Glucose, Bld 152 (*)    Creatinine, Ser 1.10 (*)    Calcium 8.7 (*)    Albumin 3.1 (*)    All other components within normal limits  MAGNESIUM - Abnormal; Notable for the following components:   Magnesium 1.6 (*)    All other components within normal limits  TROPONIN I (HIGH SENSITIVITY)    EKG KIMORAH, RIDOLFI [id number] 29-Jan-2022 00:12:53 [facility] ROUTINE RECORD 12-Jun-1970 (51 yr) Female Caucasian Room:ED020C Loc:0 Technician: [id number] Test ind: Vent. rate 74 BPM PR interval 166 ms QRS duration 99 ms QT/QTcB 465/516 ms P-R-T axes 51  51 63 Sinus rhythm Low voltage, precordial leads Prolonged QT interval When compared with ECG of 01/26/2020, HEART RATE has decreased QT has lengthened Confirmed by Dione Booze ([id number]) on 01/29/2022 12:25:49 AM Confirmed By: Dione Booze  Radiology DG Chest Port 1 View  Result Date: 01/29/2022 CLINICAL DATA:  Shortness of breath. EXAM: PORTABLE CHEST 1 VIEW COMPARISON:  Chest radiograph dated 12/31/2021. FINDINGS: No focal consolidation, pleural effusion or pneumothorax. Stable cardiac silhouette. No acute osseous pathology. IMPRESSION: No active disease. Electronically Signed   By: Elgie Collard M.D.   On: 01/29/2022 00:45    Procedures Procedures  Cardiac monitor shows normal sinus rhythm, per my interpretation.  Medications Ordered in ED Medications  magnesium sulfate IVPB 2 g 50 mL (2 g Intravenous New Bag/Given 01/29/22 0402)  furosemide (LASIX) injection 40 mg (40 mg Intravenous Given 01/29/22 0400)    ED Course/ Medical Decision Making/  A&P                           Medical Decision Making Amount and/or Complexity of Data Reviewed Labs: ordered. Radiology: ordered.  Risk Prescription drug management.   Dyspnea in patient with known heart failure and evidence of fluid overload on exam.  This most likely is an exacerbation of known heart failure.  Doubt pulmonary embolism in the setting of chronic anticoagulation.  No fever or cough to suggest pneumonia.  No wheezing to suggest exacerbation of asthma.  I have reviewed and interpreted her electrocardiogram, and my interpretation is sinus rhythm with slightly prolonged QT interval but no ST or T changes.  I have ordered screening labs of CBC, comprehensive metabolic panel, BNP, troponin and I have ordered a chest x-ray.  In light of prolonged QT interval, I have ordered a magnesium level.  I have reviewed her past records, and she was hospitalized at [facility] from 01/12/2022 through 01/16/2022 for acute kidney injury.  Creatinine on discharge on 01/16/2022 was 0.91.  Discharge diagnoses also mentioned QT prolongation.  TEE on 08/05/2019 showed ejection fraction of the 30-35% with global hypokinesis and no mention of diastolic parameters.  Chest x-ray shows no acute process.  I have independently viewed the image, and agree with radiologist's interpretation.  Laboratory evaluation shows normocytic hypochromic anemia which is down significantly from values on 01/28/2022 and 01/31/2020.  However, when I reviewed her lab values in care everywhere, hemoglobin during her recent hospitalization in early November were essentially in the same range as today.  I believe that the 2 recent hemoglobins which were significantly higher are likely laboratory errors.  There is no evidence of acute blood loss.  My interpretation of her chemistry tests are elevated random glucose level which will need to be followed as an outpatient, borderline elevated creatinine which is not  significantly changed from recent values, hypoalbuminemia which is likely nutritional, hypomagnesemia.  I have ordered a dose of intravenous magnesium.  Troponin is normal but BNP is mildly elevated.  This is consistent with heart failure.  I have ordered intravenous furosemide.  Following intravenous furosemide, she had good diuresis.  Since she is maintaining an adequate oxygen saturation on room air, I feel she can safely be returned to her skilled nursing facility.  I have requested that she double her furosemide dose for the next 5 days and then resume her prior dose.  Ideally, she will work with her primary care provider and with the nursing staff to adjust furosemide dose  based on trying keep her at her dry weight.  Final Clinical Impression(s) / ED Diagnoses Final diagnoses:  Acute on chronic congestive heart failure, unspecified heart failure type (HCC)  Normocytic anemia  Hypomagnesemia  Chronic anticoagulation    Rx / DC Orders ED Discharge Orders          Ordered    furosemide (LASIX) 40 MG tablet  Daily        01/29/22 0458              Dione Booze, MD 01/29/22 0505

## 2022-01-29 NOTE — ED Notes (Signed)
PT placed on hospital bed for comfort.

## 2022-01-29 NOTE — Discharge Instructions (Signed)
Increase your furosemide dose to 80 mg (two 40 tablets) every day for the next 5 days.  After that, return to your prior dose of furosemide.  Have your primary care provider work with the people at the nursing home to adjust your furosemide dose based on your dry weight.

## 2022-01-29 NOTE — ED Triage Notes (Addendum)
PT from [facility]. [facility] transported her and they were told she has no medical complaint. Hx CHF. RA sats 94%. PT came rolling in flat on stretcher and was not in any distress with her breathing. PT was sitting in chair in her room with no distress noted by [facility]. Stood up and walked to stretcher without any issues.

## 2022-01-29 NOTE — ED Notes (Signed)
[facility] called  

## 2022-01-31 ENCOUNTER — Telehealth: Payer: Self-pay

## 2022-01-31 ENCOUNTER — Emergency Department (HOSPITAL_COMMUNITY)
Admission: EM | Admit: 2022-01-31 | Discharge: 2022-01-31 | Disposition: A | Discharge status: Skilled Nursing Facility | Payer: Medicaid Other | Attending: Emergency Medicine | Admitting: Emergency Medicine

## 2022-01-31 ENCOUNTER — Emergency Department (HOSPITAL_COMMUNITY): Payer: Medicaid Other

## 2022-01-31 ENCOUNTER — Other Ambulatory Visit: Payer: Self-pay

## 2022-01-31 DIAGNOSIS — Z7901 Long term (current) use of anticoagulants: Secondary | ICD-10-CM | POA: Insufficient documentation

## 2022-01-31 DIAGNOSIS — D649 Anemia, unspecified: Secondary | ICD-10-CM | POA: Insufficient documentation

## 2022-01-31 DIAGNOSIS — Z7982 Long term (current) use of aspirin: Secondary | ICD-10-CM | POA: Insufficient documentation

## 2022-01-31 DIAGNOSIS — J45909 Unspecified asthma, uncomplicated: Secondary | ICD-10-CM | POA: Insufficient documentation

## 2022-01-31 DIAGNOSIS — Z91040 Latex allergy status: Secondary | ICD-10-CM | POA: Insufficient documentation

## 2022-01-31 DIAGNOSIS — Z7951 Long term (current) use of inhaled steroids: Secondary | ICD-10-CM | POA: Insufficient documentation

## 2022-01-31 DIAGNOSIS — I509 Heart failure, unspecified: Secondary | ICD-10-CM | POA: Insufficient documentation

## 2022-01-31 LAB — CBC
HCT: 29.1 % — ABNORMAL LOW (ref 36.0–46.0)
Hemoglobin: 8.3 g/dL — ABNORMAL LOW (ref 12.0–15.0)
MCH: 25.5 pg — ABNORMAL LOW (ref 26.0–34.0)
MCHC: 28.5 g/dL — ABNORMAL LOW (ref 30.0–36.0)
MCV: 89.5 fL (ref 80.0–100.0)
Platelets: 229 10*3/uL (ref 150–400)
RBC: 3.25 MIL/uL — ABNORMAL LOW (ref 3.87–5.11)
RDW: 19.6 % — ABNORMAL HIGH (ref 11.5–15.5)
WBC: 8.6 10*3/uL (ref 4.0–10.5)
nRBC: 0 % (ref 0.0–0.2)

## 2022-01-31 LAB — BASIC METABOLIC PANEL
Anion gap: 8 (ref 5–15)
BUN: 12 mg/dL (ref 6–20)
CO2: 24 mmol/L (ref 22–32)
Calcium: 8.4 mg/dL — ABNORMAL LOW (ref 8.9–10.3)
Chloride: 104 mmol/L (ref 98–111)
Creatinine, Ser: 1.01 mg/dL — ABNORMAL HIGH (ref 0.44–1.00)
GFR, Estimated: >60 mL/min (ref >60)
Glucose, Bld: 109 mg/dL — ABNORMAL HIGH (ref 70–99)
Potassium: 3.8 mmol/L (ref 3.5–5.1)
Sodium: 136 mmol/L (ref 135–145)

## 2022-01-31 LAB — MAGNESIUM: Magnesium: 1.7 mg/dL (ref 1.7–2.4)

## 2022-01-31 LAB — BRAIN NATRIURETIC PEPTIDE: B Natriuretic Peptide: 142.2 pg/mL — ABNORMAL HIGH (ref 0.0–100.0)

## 2022-01-31 MED ORDER — FUROSEMIDE 80 MG PO TABS: 80.0000 mg | ORAL_TABLET | Freq: Every day | ORAL | 0 refills | Status: DC

## 2022-01-31 MED ORDER — FUROSEMIDE 10 MG/ML IJ SOLN
80.0000 mg | Freq: Once | INTRAMUSCULAR | Status: AC
  Administered 2022-01-31: 80 mg via INTRAVENOUS
  Filled 2022-01-31: qty 8

## 2022-01-31 NOTE — Discharge Instructions (Addendum)
You were seen for your edema from your heart failure.   At home, please take 80 mg of Lasix once daily (not 40 mg twice a day) for the next 7 days.  After that please go back to your normal Lasix dosing.  Please also limit your sodium and fluid intake which will help decrease the amount of swelling that you have.    Follow-up with your primary doctor in 2-3 days regarding your visit.   Return immediately to the emergency department if you experience any of the following: Difficulty breathing, chest pain, or any other concerning symptoms.    Thank you for visiting our Emergency Department. It was a pleasure taking care of you today.

## 2022-01-31 NOTE — ED Notes (Signed)
[facility] notified of need for pt transport 

## 2022-01-31 NOTE — ED Provider Notes (Signed)
[facility]-EMERGENCY DEPT Provider Note   CSN: [id number] Arrival date & time: 01/31/22  1106     History  No chief complaint on file.   Alyssa Blanchard is a 51 y.o. female.  51 year old female with a history of CHF, atrial fibrillation on Eliquis, and morbid obesity who presents to the emergency department weight gain, lower extremity edema, and shortness of breath.  Patient was seen and hospitalized recently for heart failure exacerbation.  Also had generalized deconditioning and has been staying at [facility] for rehab.  Presented to the emergency department 2 days ago with weight gain and was discharged home on 80 mg of Lasix daily.  Instead she was given 40 mg of Lasix twice daily and has not had significant improvement of her swelling.  Also has had mild shortness of breath.  Unable to lay flat to sleep.  Says that her weight has increased from 450 pounds at the time of discharge to 486 pounds today.  Says that she did eat "lots of junk food" last night which she thinks may be related.  Denies any chest pain, fevers, or recent illness otherwise.    Past Medical History:  Diagnosis Date   Anxiety    Asthma    Atrial fibrillation (HCC)    Depression    Dysrhythmia    new onset Afib rvr   GERD (gastroesophageal reflux disease)    Heart failure (HCC)    Heel spur    bilat   History of bronchitis    History of chicken pox    History of urinary tract infection    Migraines    Plantar fasciitis    bilat   STD (sexually transmitted disease)    chl hx & hsv 1&2   Tremors of nervous system    Urinary incontinence       Home Medications Prior to Admission medications   Medication Sig Start Date End Date Taking? Authorizing Provider  furosemide (LASIX) 80 MG tablet Take 1 tablet (80 mg total) by mouth daily for 7 days. 01/31/22 02/07/22 Yes Rondel Baton, MD  Acetylcysteine 600 MG CAPS Take by mouth. 01/10/22   [provider]  apixaban  (ELIQUIS) 5 MG TABS tablet Take 1 tablet (5 mg total) by mouth 2 (two) times daily. 08/05/19 01/26/20  Jae Dire, MD  apixaban (ELIQUIS) 5 MG TABS tablet Take by mouth. 12/10/21   [provider]  ARIPiprazole (ABILIFY) 5 MG tablet Take 1 tablet (5 mg total) by mouth daily. For mood control 02/02/20   Armandina Stammer I, NP  aspirin EC 81 MG tablet Take 81 mg by mouth daily. Swallow whole.    [provider]  busPIRone (BUSPAR) 10 MG tablet TAKE 1 TABLET BY MOUTH TWICE A DAY *CAN TAKE 1/2 TABLET DURING THE DAY IF NEEDED 01/23/22   [provider]  diltiazem (CARDIZEM CD) 240 MG 24 hr capsule Take 1 capsule (240 mg total) by mouth daily. 08/06/19 01/25/21  Jae Dire, MD  dofetilide Joice Lofts) 500 MCG capsule Take by mouth. 01/17/22   [provider]  fluticasone furoate-vilanterol (BREO ELLIPTA) 200-25 MCG/ACT AEPB Inhale 1 puff into the lungs daily. 12/17/21   [provider]  glipiZIDE (GLUCOTROL) 10 MG tablet Take by mouth. 12/02/21 03/02/22  [provider]  hydrOXYzine (ATARAX/VISTARIL) 50 MG tablet Take 1 tablet (50 mg total) by mouth 3 (three) times daily as needed for anxiety. 02/01/20   Armandina Stammer I, NP  iron polysaccharides (  NIFEREX) 150 MG capsule Take 150 mg by mouth daily.    [provider]  levalbuterol Pauline Aus HFA) 45 MCG/ACT inhaler Inhale 2 puffs into the lungs every 6 (six) hours as needed for wheezing or shortness of breath.  Patient not taking: Reported on 01/28/2022 06/28/19   [provider]  losartan (COZAAR) 100 MG tablet Take 100 mg by mouth daily.    [provider]  nystatin (MYCOSTATIN/NYSTOP) powder Apply topically 2 (two) times daily. 02/01/20   Armandina Stammer I, NP  pantoprazole (PROTONIX) 40 MG tablet Take 1 tablet (40 mg total) by mouth 2 (two) times daily. Patient taking differently: Take 40 mg by mouth daily as needed (Acid reflux). 04/20/19   Malvin Johns, MD  polyethylene glycol  powder [facility]) 17 GM/SCOOP powder Take by mouth. 01/10/22   [provider]  potassium chloride SA (KLOR-CON M) 20 MEQ tablet Take by mouth. 01/11/22   [provider]  pregabalin (LYRICA) 100 MG capsule  01/27/22   [provider]  promethazine (PHENERGAN) 12.5 MG tablet Take by mouth. 01/12/22   [provider]  propranolol (INDERAL) 80 MG tablet Take by mouth. 01/19/22 04/19/22  [provider]  silver sulfADIAZINE (SILVADENE) 1 % cream Apply topically.    [provider]  simethicone (MYLICON) 80 MG chewable tablet Chew by mouth. 01/10/22   [provider]  spironolactone (ALDACTONE) 25 MG tablet Take 0.5 tablets by mouth daily. 01/11/22   [provider]  triamcinolone cream (KENALOG) 0.1 % Apply 1 Application topically 2 (two) times daily.    [provider]  Vitamin D, Ergocalciferol, (DRISDOL) 1.25 MG (50000 UNIT) CAPS capsule Take 50,000 Units by mouth once a week. 01/16/22   [provider]  VOLTAREN ARTHRITIS PAIN 1 % GEL Apply topically. 01/20/22   [provider]      Allergies    Gabapentin, Topiramate, Advair diskus [fluticasone-salmeterol], Imitrex [sumatriptan], Latex, Montelukast, Other, Prozac [fluoxetine], Trazodone and nefazodone, and Copper-containing compounds    Review of Systems   Review of Systems  Physical Exam Updated Vital Signs BP 133/64   Pulse 81   Temp 98 F (36.7 C) (Oral)   Resp 17   SpO2 99%  Physical Exam Vitals and nursing note reviewed.  Constitutional:      General: She is not in acute distress.    Appearance: She is well-developed.  HENT:     Head: Normocephalic and atraumatic.     Right Ear: External ear normal.     Left Ear: External ear normal.     Nose: Nose normal.  Eyes:     Extraocular Movements: Extraocular movements intact.     Conjunctiva/sclera: Conjunctivae normal.     Pupils: Pupils are equal, round, and reactive to light.   Cardiovascular:     Rate and Rhythm: Normal rate and regular rhythm.     Heart sounds: No murmur heard. Pulmonary:     Effort: Pulmonary effort is normal. No respiratory distress.     Breath sounds: Normal breath sounds.     Comments: Limited due to habitus Abdominal:     General: Abdomen is flat. There is no distension.     Palpations: Abdomen is soft. There is no mass.     Tenderness: There is no abdominal tenderness. There is no guarding.  Musculoskeletal:        General: No swelling.     Cervical back: Normal range of motion and neck supple.     Right lower  leg: Edema (2+) present.     Left lower leg: Edema (2+) present.  Skin:    General: Skin is warm and dry.     Capillary Refill: Capillary refill takes less than 2 seconds.  Neurological:     Mental Status: She is alert and oriented to person, place, and time. Mental status is at baseline.  Psychiatric:        Mood and Affect: Mood normal.     ED Results / Procedures / Treatments   Labs (all labs ordered are listed, but only abnormal results are displayed) Labs Reviewed  CBC - Abnormal; Notable for the following components:      Result Value   RBC 3.25 (*)    Hemoglobin 8.3 (*)    HCT 29.1 (*)    MCH 25.5 (*)    MCHC 28.5 (*)    RDW 19.6 (*)    All other components within normal limits  BASIC METABOLIC PANEL - Abnormal; Notable for the following components:   Glucose, Bld 109 (*)    Creatinine, Ser 1.01 (*)    Calcium 8.4 (*)    All other components within normal limits  BRAIN NATRIURETIC PEPTIDE - Abnormal; Notable for the following components:   B Natriuretic Peptide 142.2 (*)    All other components within normal limits  MAGNESIUM    EKG None  Radiology DG Chest Port 1 View  Result Date: 01/31/2022 CLINICAL DATA:  Shortness of breath. EXAM: PORTABLE CHEST 1 VIEW COMPARISON:  01/29/2022 and prior studies FINDINGS: Slightly limited evaluation due to technique/body habitus. Cardiomediastinal silhouette  is unchanged. There is no evidence of focal airspace disease, pulmonary edema, suspicious pulmonary nodule/mass, pleural effusion, or pneumothorax. No acute bony abnormalities are identified. IMPRESSION: No active disease. Electronically Signed   By: Harmon Pier M.D.   On: 01/31/2022 11:58    Procedures Procedures   Medications Ordered in ED Medications  furosemide (LASIX) injection 80 mg (80 mg Intravenous Given 01/31/22 1304)    ED Course/ Medical Decision Making/ A&P Clinical Course as of 02/01/22 1327  Fri Jan 31, 2022  1425 Per nursing staff has been eating popcorn and potato chips. Also door dashed foods this week.  Communicated with them that she should be taking 80 mg lasix once a day not 40 mg twice daily. [RP]    Clinical Course User Index [RP] Rondel Baton, MD                           Medical Decision Making Amount and/or Complexity of Data Reviewed Labs: ordered. Radiology: ordered.  Risk Prescription drug management.   Daylyn Ravan Schlemmer is a 51 y.o. female with comorbidities that complicate the patient evaluation including CHF, atrial fibrillation on Eliquis, and morbid obesity who presents to the emergency department weight gain, lower extremity edema, and shortness of breath.    Initial Ddx:  Heart failure exacerbation, kidney injury, dietary discretion, pneumonia  MDM:  Feel the patient is likely having volume overload from heart failure exacerbation that has been worsened by dietary indiscretion as well as not taking her Lasix as prescribed.  We will assess for kidney injury which will decrease her urine output with Lasix in case this is contributing.  No infectious symptoms that would suggest a pneumonia or URI.  Plan:  Labs BNP EKG Chest x-ray IV Lasix  ED Summary/Re-evaluation:  Patient assessed in the emergency department had over 1.5 L of urine output.  No increased oxygen requirements and did not appear to be in any acute distress.  Called her  facility and discussed that she should take the Lasix 80 mg at the same time and not 40 twice daily.  Counseled the patient on not ordering door-or eating potato chips or foods that were high in salt content.  Patient was discharged to her facility with instructions to follow-up with her primary doctor in several days.  This patient presents to the ED for concern of complaints listed in HPI, this involves an extensive number of treatment options, and is a complaint that carries with it a high risk of complications and morbidity. Disposition including potential need for admission considered.   Dispo: DC to Facility  Additional history obtained from Nursing Home/Care Facility Records reviewed ED Visit Notes The following labs were independently interpreted: Chemistry and CBC and show chronic anemia I independently reviewed the following imaging with scope of interpretation limited to determining acute life threatening conditions related to emergency care: Chest x-ray, which revealed no acute abnormality  I personally reviewed and interpreted cardiac monitoring: normal sinus rhythm  I personally reviewed and interpreted the pt's EKG: see above for interpretation  I have reviewed the patients home medications and made adjustments as needed Social Determinants of health:  SNF resident  Final Clinical Impression(s) / ED Diagnoses Final diagnoses:  Edema due to congestive heart failure [facility](HCC)    Rx / DC Orders ED Discharge Orders          Ordered    furosemide (LASIX) 80 MG tablet  Daily        01/31/22 1446              Rondel BatonPaterson, Demetre Monaco C, MD 02/01/22 1327

## 2022-01-31 NOTE — Telephone Encounter (Signed)
This nurse received a message from this patient from 11/23.  Patient stated that she noticed that her labs were back and she is requesting to speak with the provider about the results.  This nurse attempted to reach patient.   No answer.  No further concerns noted at this time.  This information will be forwarded to the provider.

## 2022-01-31 NOTE — ED Triage Notes (Signed)
BIB [facility] from [facility] for generalized edema. 36 lbs fluid buildup- non compliant with diet, hx of CHF on lasix 78 HR 96% r/a 112/53 BP

## 2022-02-02 ENCOUNTER — Encounter: Payer: Self-pay | Admitting: Physician Assistant

## 2022-02-02 DIAGNOSIS — D509 Iron deficiency anemia, unspecified: Secondary | ICD-10-CM | POA: Insufficient documentation

## 2022-02-03 ENCOUNTER — Telehealth: Payer: Self-pay

## 2022-02-03 LAB — METHYLMALONIC ACID, SERUM: Methylmalonic Acid, Quantitative: 359 nmol/L (ref 0–378)

## 2022-02-03 NOTE — Telephone Encounter (Signed)
Called and left message for patient to return my call regarding lab results/ iron infusions.

## 2022-02-04 ENCOUNTER — Encounter: Payer: Self-pay | Admitting: Physician Assistant

## 2022-02-05 ENCOUNTER — Telehealth: Payer: Self-pay | Admitting: Pharmacy Technician

## 2022-02-05 NOTE — Telephone Encounter (Addendum)
Patient qualifies for Monoferric PAP due to patient is under insured Patient has [employer] [facility] [employer] which will not cover monoferric treatment.  Forms has been faxed [phone]) to Dr. Mariane Baumgarten for signature to begin the enrollment.(awaiting forms to be returned). Once patient has been approved we will schedule patient as soon as possible.

## 2022-02-07 NOTE — Telephone Encounter (Signed)
Dr. Mariane Baumgarten, Fax has been received and paperwork has been sent to Monoferric PAP. I will f/u once I have a response. Ty

## 2022-02-07 NOTE — Telephone Encounter (Signed)
Dr. Mariane Baumgarten, It has been re-faxed.  Unfortunately we have been having issues with our fax.  Hopefully it has been resolved. [facility] fax: [phone] Thanks Selena Batten

## 2022-02-07 NOTE — Telephone Encounter (Signed)
Ansyi, Yes, I have received it and it has been faxed to the Monoferric PAP program.   Thanks for your help.

## 2022-02-07 NOTE — Telephone Encounter (Signed)
Dr. Mariane Baumgarten, Unfortunately, I have not received the forms yet.  Can you please refax them. Thanks Selena Batten

## 2022-02-10 NOTE — Telephone Encounter (Signed)
F/u: Monoferric  Patient has been approved for Monoferric (free drug) and will be scheduled as soon as possible. K.C.

## 2022-02-12 ENCOUNTER — Encounter: Payer: Self-pay | Admitting: Physician Assistant

## 2022-02-13 ENCOUNTER — Encounter: Payer: Self-pay | Admitting: Physician Assistant

## 2022-02-16 ENCOUNTER — Encounter (HOSPITAL_COMMUNITY): Payer: Self-pay | Admitting: Internal Medicine

## 2022-02-16 ENCOUNTER — Inpatient Hospital Stay (HOSPITAL_COMMUNITY): Payer: Medicaid Other

## 2022-02-16 ENCOUNTER — Inpatient Hospital Stay (HOSPITAL_COMMUNITY)
Admission: EM | Admit: 2022-02-16 | Discharge: 2022-02-28 | DRG: 603 | Disposition: A | Discharge status: Rehabilitation Facility | Payer: Medicaid Other | Attending: Family Medicine | Admitting: Family Medicine

## 2022-02-16 ENCOUNTER — Other Ambulatory Visit: Payer: Self-pay

## 2022-02-16 ENCOUNTER — Emergency Department (HOSPITAL_COMMUNITY): Payer: Medicaid Other

## 2022-02-16 DIAGNOSIS — R110 Nausea: Secondary | ICD-10-CM | POA: Diagnosis not present

## 2022-02-16 DIAGNOSIS — R262 Difficulty in walking, not elsewhere classified: Secondary | ICD-10-CM | POA: Diagnosis present

## 2022-02-16 DIAGNOSIS — D649 Anemia, unspecified: Secondary | ICD-10-CM | POA: Diagnosis not present

## 2022-02-16 DIAGNOSIS — I959 Hypotension, unspecified: Secondary | ICD-10-CM | POA: Diagnosis not present

## 2022-02-16 DIAGNOSIS — K625 Hemorrhage of anus and rectum: Secondary | ICD-10-CM | POA: Diagnosis not present

## 2022-02-16 DIAGNOSIS — Z79899 Other long term (current) drug therapy: Secondary | ICD-10-CM

## 2022-02-16 DIAGNOSIS — K219 Gastro-esophageal reflux disease without esophagitis: Secondary | ICD-10-CM | POA: Diagnosis present

## 2022-02-16 DIAGNOSIS — I5023 Acute on chronic systolic (congestive) heart failure: Secondary | ICD-10-CM | POA: Diagnosis not present

## 2022-02-16 DIAGNOSIS — E662 Morbid (severe) obesity with alveolar hypoventilation: Secondary | ICD-10-CM | POA: Diagnosis present

## 2022-02-16 DIAGNOSIS — Z91119 Patient's noncompliance with dietary regimen due to unspecified reason: Secondary | ICD-10-CM | POA: Diagnosis not present

## 2022-02-16 DIAGNOSIS — I4891 Unspecified atrial fibrillation: Secondary | ICD-10-CM | POA: Diagnosis not present

## 2022-02-16 DIAGNOSIS — I509 Heart failure, unspecified: Secondary | ICD-10-CM | POA: Diagnosis not present

## 2022-02-16 DIAGNOSIS — R21 Rash and other nonspecific skin eruption: Secondary | ICD-10-CM | POA: Diagnosis not present

## 2022-02-16 DIAGNOSIS — J45909 Unspecified asthma, uncomplicated: Secondary | ICD-10-CM | POA: Diagnosis present

## 2022-02-16 DIAGNOSIS — E876 Hypokalemia: Secondary | ICD-10-CM | POA: Diagnosis not present

## 2022-02-16 DIAGNOSIS — M5416 Radiculopathy, lumbar region: Secondary | ICD-10-CM | POA: Diagnosis present

## 2022-02-16 DIAGNOSIS — Z6841 Body Mass Index (BMI) 40.0 and over, adult: Secondary | ICD-10-CM | POA: Diagnosis not present

## 2022-02-16 DIAGNOSIS — E119 Type 2 diabetes mellitus without complications: Secondary | ICD-10-CM | POA: Diagnosis present

## 2022-02-16 DIAGNOSIS — D509 Iron deficiency anemia, unspecified: Secondary | ICD-10-CM | POA: Diagnosis present

## 2022-02-16 DIAGNOSIS — Z87891 Personal history of nicotine dependence: Secondary | ICD-10-CM

## 2022-02-16 DIAGNOSIS — I5022 Chronic systolic (congestive) heart failure: Secondary | ICD-10-CM | POA: Diagnosis not present

## 2022-02-16 DIAGNOSIS — L304 Erythema intertrigo: Secondary | ICD-10-CM | POA: Diagnosis present

## 2022-02-16 DIAGNOSIS — B379 Candidiasis, unspecified: Secondary | ICD-10-CM | POA: Diagnosis not present

## 2022-02-16 DIAGNOSIS — L309 Dermatitis, unspecified: Secondary | ICD-10-CM | POA: Diagnosis present

## 2022-02-16 DIAGNOSIS — Z820 Family history of epilepsy and other diseases of the nervous system: Secondary | ICD-10-CM | POA: Diagnosis not present

## 2022-02-16 DIAGNOSIS — L03115 Cellulitis of right lower limb: Principal | ICD-10-CM | POA: Diagnosis present

## 2022-02-16 DIAGNOSIS — R9431 Abnormal electrocardiogram [ECG] [EKG]: Secondary | ICD-10-CM | POA: Insufficient documentation

## 2022-02-16 DIAGNOSIS — I5042 Chronic combined systolic (congestive) and diastolic (congestive) heart failure: Secondary | ICD-10-CM | POA: Diagnosis present

## 2022-02-16 DIAGNOSIS — Z841 Family history of disorders of kidney and ureter: Secondary | ICD-10-CM | POA: Diagnosis not present

## 2022-02-16 DIAGNOSIS — F3174 Bipolar disorder, in full remission, most recent episode manic: Secondary | ICD-10-CM | POA: Diagnosis not present

## 2022-02-16 DIAGNOSIS — Z888 Allergy status to other drugs, medicaments and biological substances status: Secondary | ICD-10-CM

## 2022-02-16 DIAGNOSIS — Z8249 Family history of ischemic heart disease and other diseases of the circulatory system: Secondary | ICD-10-CM

## 2022-02-16 DIAGNOSIS — I5033 Acute on chronic diastolic (congestive) heart failure: Secondary | ICD-10-CM | POA: Diagnosis present

## 2022-02-16 DIAGNOSIS — Z7984 Long term (current) use of oral hypoglycemic drugs: Secondary | ICD-10-CM

## 2022-02-16 DIAGNOSIS — Z83438 Family history of other disorder of lipoprotein metabolism and other lipidemia: Secondary | ICD-10-CM | POA: Diagnosis not present

## 2022-02-16 DIAGNOSIS — I480 Paroxysmal atrial fibrillation: Secondary | ICD-10-CM | POA: Diagnosis present

## 2022-02-16 DIAGNOSIS — D508 Other iron deficiency anemias: Secondary | ICD-10-CM | POA: Diagnosis not present

## 2022-02-16 DIAGNOSIS — E1165 Type 2 diabetes mellitus with hyperglycemia: Secondary | ICD-10-CM | POA: Diagnosis not present

## 2022-02-16 DIAGNOSIS — F319 Bipolar disorder, unspecified: Secondary | ICD-10-CM | POA: Diagnosis present

## 2022-02-16 DIAGNOSIS — Z833 Family history of diabetes mellitus: Secondary | ICD-10-CM | POA: Diagnosis not present

## 2022-02-16 DIAGNOSIS — R5381 Other malaise: Secondary | ICD-10-CM | POA: Diagnosis not present

## 2022-02-16 DIAGNOSIS — K6289 Other specified diseases of anus and rectum: Secondary | ICD-10-CM | POA: Diagnosis not present

## 2022-02-16 DIAGNOSIS — M1712 Unilateral primary osteoarthritis, left knee: Secondary | ICD-10-CM | POA: Diagnosis not present

## 2022-02-16 DIAGNOSIS — K602 Anal fissure, unspecified: Secondary | ICD-10-CM | POA: Diagnosis not present

## 2022-02-16 DIAGNOSIS — Z91040 Latex allergy status: Secondary | ICD-10-CM

## 2022-02-16 DIAGNOSIS — K5900 Constipation, unspecified: Secondary | ICD-10-CM | POA: Diagnosis not present

## 2022-02-16 DIAGNOSIS — Z7901 Long term (current) use of anticoagulants: Secondary | ICD-10-CM | POA: Diagnosis not present

## 2022-02-16 DIAGNOSIS — F419 Anxiety disorder, unspecified: Secondary | ICD-10-CM | POA: Diagnosis present

## 2022-02-16 DIAGNOSIS — E1142 Type 2 diabetes mellitus with diabetic polyneuropathy: Secondary | ICD-10-CM | POA: Diagnosis not present

## 2022-02-16 DIAGNOSIS — M797 Fibromyalgia: Secondary | ICD-10-CM | POA: Diagnosis present

## 2022-02-16 DIAGNOSIS — R7989 Other specified abnormal findings of blood chemistry: Secondary | ICD-10-CM | POA: Diagnosis not present

## 2022-02-16 DIAGNOSIS — M25551 Pain in right hip: Secondary | ICD-10-CM | POA: Diagnosis not present

## 2022-02-16 DIAGNOSIS — Z794 Long term (current) use of insulin: Secondary | ICD-10-CM | POA: Diagnosis not present

## 2022-02-16 HISTORY — DX: Type 2 diabetes mellitus without complications: E11.9

## 2022-02-16 LAB — URINALYSIS, ROUTINE W REFLEX MICROSCOPIC
Bilirubin Urine: NEGATIVE
Glucose, UA: NEGATIVE mg/dL
Hgb urine dipstick: NEGATIVE
Ketones, ur: NEGATIVE mg/dL
Leukocytes,Ua: NEGATIVE
Nitrite: NEGATIVE
Protein, ur: NEGATIVE mg/dL
Specific Gravity, Urine: <1.005 — ABNORMAL LOW (ref 1.005–1.030)
pH: 6 (ref 5.0–8.0)

## 2022-02-16 LAB — COMPREHENSIVE METABOLIC PANEL
ALT: 19 U/L (ref 0–44)
AST: 31 U/L (ref 15–41)
Albumin: 3.4 g/dL — ABNORMAL LOW (ref 3.5–5.0)
Alkaline Phosphatase: 74 U/L (ref 38–126)
Anion gap: 10 (ref 5–15)
BUN: 11 mg/dL (ref 6–20)
CO2: 26 mmol/L (ref 22–32)
Calcium: 8.7 mg/dL — ABNORMAL LOW (ref 8.9–10.3)
Chloride: 98 mmol/L (ref 98–111)
Creatinine, Ser: 0.94 mg/dL (ref 0.44–1.00)
GFR, Estimated: >60 mL/min (ref >60)
Glucose, Bld: 138 mg/dL — ABNORMAL HIGH (ref 70–99)
Potassium: 4.1 mmol/L (ref 3.5–5.1)
Sodium: 134 mmol/L — ABNORMAL LOW (ref 135–145)
Total Bilirubin: 0.9 mg/dL (ref 0.3–1.2)
Total Protein: 7.6 g/dL (ref 6.5–8.1)

## 2022-02-16 LAB — VITAMIN B12: Vitamin B-12: 332 pg/mL (ref 180–914)

## 2022-02-16 LAB — IRON AND TIBC
Iron: 32 ug/dL (ref 28–170)
Saturation Ratios: 7 % — ABNORMAL LOW (ref 10.4–31.8)
TIBC: 473 ug/dL — ABNORMAL HIGH (ref 250–450)
UIBC: 441 ug/dL

## 2022-02-16 LAB — CBC WITH DIFFERENTIAL/PLATELET
Abs Immature Granulocytes: 0.12 10*3/uL — ABNORMAL HIGH (ref 0.00–0.07)
Basophils Absolute: 0.1 10*3/uL (ref 0.0–0.1)
Basophils Relative: 1 %
Eosinophils Absolute: 0.3 10*3/uL (ref 0.0–0.5)
Eosinophils Relative: 2 %
HCT: 30.7 % — ABNORMAL LOW (ref 36.0–46.0)
Hemoglobin: 8.7 g/dL — ABNORMAL LOW (ref 12.0–15.0)
Immature Granulocytes: 1 %
Lymphocytes Relative: 13 %
Lymphs Abs: 1.7 10*3/uL (ref 0.7–4.0)
MCH: 25.1 pg — ABNORMAL LOW (ref 26.0–34.0)
MCHC: 28.3 g/dL — ABNORMAL LOW (ref 30.0–36.0)
MCV: 88.5 fL (ref 80.0–100.0)
Monocytes Absolute: 1.2 10*3/uL — ABNORMAL HIGH (ref 0.1–1.0)
Monocytes Relative: 9 %
Neutro Abs: 9.6 10*3/uL — ABNORMAL HIGH (ref 1.7–7.7)
Neutrophils Relative %: 74 %
Platelets: 275 10*3/uL (ref 150–400)
RBC: 3.47 MIL/uL — ABNORMAL LOW (ref 3.87–5.11)
RDW: 18.6 % — ABNORMAL HIGH (ref 11.5–15.5)
WBC: 13 10*3/uL — ABNORMAL HIGH (ref 4.0–10.5)
nRBC: 0 % (ref 0.0–0.2)

## 2022-02-16 LAB — I-STAT CHEM 8, ED
BUN: 8 mg/dL (ref 6–20)
Calcium, Ion: 1.14 mmol/L — ABNORMAL LOW (ref 1.15–1.40)
Chloride: 94 mmol/L — ABNORMAL LOW (ref 98–111)
Creatinine, Ser: 0.8 mg/dL (ref 0.44–1.00)
Glucose, Bld: 140 mg/dL — ABNORMAL HIGH (ref 70–99)
HCT: 31 % — ABNORMAL LOW (ref 36.0–46.0)
Hemoglobin: 10.5 g/dL — ABNORMAL LOW (ref 12.0–15.0)
Potassium: 4.2 mmol/L (ref 3.5–5.1)
Sodium: 134 mmol/L — ABNORMAL LOW (ref 135–145)
TCO2: 27 mmol/L (ref 22–32)

## 2022-02-16 LAB — RETICULOCYTES
Immature Retic Fract: 29.3 % — ABNORMAL HIGH (ref 2.3–15.9)
RBC.: 3.4 MIL/uL — ABNORMAL LOW (ref 3.87–5.11)
Retic Count, Absolute: 134.6 10*3/uL (ref 19.0–186.0)
Retic Ct Pct: 4 % — ABNORMAL HIGH (ref 0.4–3.1)

## 2022-02-16 LAB — LACTIC ACID, PLASMA
Lactic Acid, Venous: 2 mmol/L (ref 0.5–1.9)
Lactic Acid, Venous: 1.5 mmol/L (ref 0.5–1.9)

## 2022-02-16 LAB — MAGNESIUM: Magnesium: 2.1 mg/dL (ref 1.7–2.4)

## 2022-02-16 LAB — I-STAT BETA HCG BLOOD, ED (MC, WL, AP ONLY): I-stat hCG, quantitative: <5 m[IU]/mL (ref <5)

## 2022-02-16 LAB — BRAIN NATRIURETIC PEPTIDE: B Natriuretic Peptide: 120.5 pg/mL — ABNORMAL HIGH (ref 0.0–100.0)

## 2022-02-16 LAB — FERRITIN: Ferritin: 22 ng/mL (ref 11–307)

## 2022-02-16 LAB — FOLATE: Folate: 18.3 ng/mL (ref >5.9)

## 2022-02-16 MED ORDER — ARIPIPRAZOLE 5 MG PO TABS: 5.0000 mg | ORAL_TABLET | Freq: Every day | ORAL | Status: DC

## 2022-02-16 MED ORDER — FUROSEMIDE 10 MG/ML IJ SOLN
40.0000 mg | Freq: Once | INTRAMUSCULAR | Status: AC
  Administered 2022-02-16: 40 mg via INTRAVENOUS
  Filled 2022-02-16: qty 4

## 2022-02-16 MED ORDER — MELATONIN 5 MG PO TABS
10.0000 mg | ORAL_TABLET | Freq: Every day | ORAL | Status: DC
  Administered 2022-02-16 – 2022-02-27 (×12): 10 mg via ORAL
  Filled 2022-02-16 (×12): qty 2

## 2022-02-16 MED ORDER — POLYSACCHARIDE IRON COMPLEX 150 MG PO CAPS
150.0000 mg | ORAL_CAPSULE | Freq: Every day | ORAL | Status: DC
  Administered 2022-02-21: 150 mg via ORAL
  Filled 2022-02-16 (×8): qty 1

## 2022-02-16 MED ORDER — POTASSIUM CHLORIDE CRYS ER 20 MEQ PO TBCR
20.0000 meq | EXTENDED_RELEASE_TABLET | Freq: Once | ORAL | Status: AC
  Administered 2022-02-16: 20 meq via ORAL
  Filled 2022-02-16: qty 1

## 2022-02-16 MED ORDER — ASPIRIN 81 MG PO TBEC: 81.0000 mg | DELAYED_RELEASE_TABLET | Freq: Every day | ORAL | Status: DC

## 2022-02-16 MED ORDER — CEFAZOLIN SODIUM-DEXTROSE 2-4 GM/100ML-% IV SOLN
2.0000 g | Freq: Once | INTRAVENOUS | Status: AC
  Administered 2022-02-16: 2 g via INTRAVENOUS
  Filled 2022-02-16: qty 100

## 2022-02-16 MED ORDER — ZOLPIDEM TARTRATE 5 MG PO TABS: 5.0000 mg | ORAL_TABLET | Freq: Every evening | ORAL | Status: DC | PRN

## 2022-02-16 MED ORDER — POLYETHYLENE GLYCOL 3350 17 G PO PACK
17.0000 g | PACK | Freq: Every day | ORAL | Status: DC
  Administered 2022-02-19: 17 g via ORAL
  Filled 2022-02-16 (×5): qty 1

## 2022-02-16 MED ORDER — SPIRONOLACTONE 25 MG PO TABS: 25.0000 mg | ORAL_TABLET | Freq: Every day | ORAL | Status: DC

## 2022-02-16 MED ORDER — CEFAZOLIN SODIUM-DEXTROSE 2-4 GM/100ML-% IV SOLN
2.0000 g | Freq: Three times a day (TID) | INTRAVENOUS | Status: DC
  Administered 2022-02-16 – 2022-02-21 (×16): 2 g via INTRAVENOUS
  Filled 2022-02-16 (×17): qty 100

## 2022-02-16 MED ORDER — BUMETANIDE 1 MG PO TABS: 2.0000 mg | ORAL_TABLET | Freq: Every day | ORAL | Status: DC

## 2022-02-16 MED ORDER — SODIUM CHLORIDE 0.9% FLUSH: 3.0000 mL | INTRAVENOUS | Status: DC | PRN

## 2022-02-16 MED ORDER — CEFAZOLIN SODIUM-DEXTROSE 2-4 GM/100ML-% IV SOLN: 2.0000 g | Freq: Three times a day (TID) | INTRAVENOUS | Status: DC

## 2022-02-16 MED ORDER — LOSARTAN POTASSIUM 25 MG PO TABS
100.0000 mg | ORAL_TABLET | Freq: Every day | ORAL | Status: DC
  Filled 2022-02-16: qty 4

## 2022-02-16 MED ORDER — HYDROXYZINE HCL 25 MG PO TABS
50.0000 mg | ORAL_TABLET | Freq: Three times a day (TID) | ORAL | Status: DC | PRN
  Filled 2022-02-16: qty 2

## 2022-02-16 MED ORDER — NYSTATIN 100000 UNIT/GM EX POWD
Freq: Two times a day (BID) | CUTANEOUS | Status: DC
  Filled 2022-02-16: qty 15

## 2022-02-16 MED ORDER — SODIUM CHLORIDE 0.9 % IV SOLN
250.0000 mL | INTRAVENOUS | Status: DC | PRN
  Administered 2022-02-16: 10 mL via INTRAVENOUS

## 2022-02-16 MED ORDER — HYDROXYZINE HCL 25 MG PO TABS: 50.0000 mg | ORAL_TABLET | Freq: Three times a day (TID) | ORAL | Status: DC | PRN

## 2022-02-16 MED ORDER — PREGABALIN 50 MG PO CAPS: 100.0000 mg | ORAL_CAPSULE | Freq: Two times a day (BID) | ORAL | Status: DC

## 2022-02-16 MED ORDER — DIAZEPAM 5 MG/ML IJ SOLN
5.0000 mg | Freq: Once | INTRAMUSCULAR | Status: AC
  Administered 2022-02-16: 5 mg via INTRAVENOUS
  Filled 2022-02-16: qty 2

## 2022-02-16 MED ORDER — PROPRANOLOL HCL 20 MG PO TABS: 80.0000 mg | ORAL_TABLET | Freq: Two times a day (BID) | ORAL | Status: DC

## 2022-02-16 MED ORDER — BUSPIRONE HCL 10 MG PO TABS: 10.0000 mg | ORAL_TABLET | Freq: Two times a day (BID) | ORAL | Status: DC

## 2022-02-16 MED ORDER — FUROSEMIDE 40 MG PO TABS: 80.0000 mg | ORAL_TABLET | Freq: Every day | ORAL | Status: DC

## 2022-02-16 MED ORDER — KETOROLAC TROMETHAMINE 15 MG/ML IJ SOLN
15.0000 mg | Freq: Four times a day (QID) | INTRAMUSCULAR | Status: DC
  Administered 2022-02-16 – 2022-02-17 (×4): 15 mg via INTRAVENOUS
  Filled 2022-02-16 (×4): qty 1

## 2022-02-16 MED ORDER — DOFETILIDE 250 MCG PO CAPS
500.0000 ug | ORAL_CAPSULE | Freq: Two times a day (BID) | ORAL | Status: DC
  Administered 2022-02-16 – 2022-02-28 (×25): 500 ug via ORAL
  Filled 2022-02-16 (×24): qty 2

## 2022-02-16 MED ORDER — BUMETANIDE 1 MG PO TABS
2.0000 mg | ORAL_TABLET | Freq: Every day | ORAL | Status: DC
  Administered 2022-02-16 – 2022-02-28 (×13): 2 mg via ORAL
  Filled 2022-02-16 (×13): qty 2

## 2022-02-16 MED ORDER — FLUTICASONE FUROATE-VILANTEROL 200-25 MCG/ACT IN AEPB
1.0000 | INHALATION_SPRAY | Freq: Every day | RESPIRATORY_TRACT | Status: DC
  Administered 2022-02-17 – 2022-02-28 (×12): 1 via RESPIRATORY_TRACT
  Filled 2022-02-16: qty 28

## 2022-02-16 MED ORDER — PREGABALIN 100 MG PO CAPS
100.0000 mg | ORAL_CAPSULE | Freq: Every day | ORAL | Status: DC
  Administered 2022-02-17: 100 mg via ORAL
  Filled 2022-02-16: qty 1

## 2022-02-16 MED ORDER — SIMETHICONE 80 MG PO CHEW
80.0000 mg | CHEWABLE_TABLET | Freq: Four times a day (QID) | ORAL | Status: DC | PRN
  Administered 2022-02-17: 80 mg via ORAL
  Filled 2022-02-16: qty 1

## 2022-02-16 MED ORDER — APIXABAN 5 MG PO TABS
5.0000 mg | ORAL_TABLET | Freq: Two times a day (BID) | ORAL | Status: DC
  Administered 2022-02-16 – 2022-02-28 (×25): 5 mg via ORAL
  Filled 2022-02-16 (×25): qty 1

## 2022-02-16 MED ORDER — PANTOPRAZOLE SODIUM 40 MG PO TBEC: 40.0000 mg | DELAYED_RELEASE_TABLET | Freq: Every day | ORAL | Status: DC | PRN

## 2022-02-16 MED ORDER — DILTIAZEM HCL ER COATED BEADS 120 MG PO CP24
240.0000 mg | ORAL_CAPSULE | Freq: Every day | ORAL | Status: DC
  Filled 2022-02-16 (×2): qty 2

## 2022-02-16 MED ORDER — DOFETILIDE 250 MCG PO CAPS: 500.0000 ug | ORAL_CAPSULE | ORAL | Status: DC

## 2022-02-16 MED ORDER — PROPRANOLOL HCL 40 MG PO TABS
80.0000 mg | ORAL_TABLET | Freq: Three times a day (TID) | ORAL | Status: DC
  Administered 2022-02-17 – 2022-02-28 (×31): 80 mg via ORAL
  Filled 2022-02-16 (×16): qty 2
  Filled 2022-02-16: qty 8
  Filled 2022-02-16 (×21): qty 2

## 2022-02-16 MED ORDER — KETOROLAC TROMETHAMINE 15 MG/ML IJ SOLN
15.0000 mg | Freq: Once | INTRAMUSCULAR | Status: AC
  Administered 2022-02-16: 15 mg via INTRAVENOUS
  Filled 2022-02-16: qty 1

## 2022-02-16 MED ORDER — SODIUM CHLORIDE 0.9% FLUSH
3.0000 mL | Freq: Two times a day (BID) | INTRAVENOUS | Status: DC
  Administered 2022-02-20 – 2022-02-28 (×13): 3 mL via INTRAVENOUS

## 2022-02-16 MED ORDER — SPIRONOLACTONE 25 MG PO TABS
25.0000 mg | ORAL_TABLET | Freq: Every day | ORAL | Status: DC
  Administered 2022-02-16 – 2022-02-28 (×13): 25 mg via ORAL
  Filled 2022-02-16 (×13): qty 1

## 2022-02-16 MED ORDER — NYSTATIN 100000 UNIT/GM EX POWD
Freq: Two times a day (BID) | CUTANEOUS | Status: DC
  Administered 2022-02-17: 1 via TOPICAL
  Filled 2022-02-16 (×2): qty 30

## 2022-02-16 NOTE — ED Provider Notes (Signed)
[facility]-EMERGENCY DEPT Provider Note  CSN: [id number] Arrival date & time: 02/16/22 0350  Chief Complaint(s) Leg Pain  HPI Alyssa Blanchard is a 51 y.o. female with extensive past medical history listed below including morbid obesity, chronic systolic/diastolic heart failure on Bumex and spironolactone now with an improved EF of 60 to 65% on recent echo, history of A-fib on Tikosyn and Eliquis, who was previously admitted to [facility] for CHF exacerbation requiring IV diuresis.  She presents today for severe right hip pain described as stabbing/throbbing sensation radiating down to the leg.  This began just prior to being discharged from [facility].  After being discharged, patient went to [facility] for 2 weeks and then was discharged home 2 weeks ago.  She reports that she has been able to ambulate with a walker since.  However today the pain was more severe and caused her to lose balance and slide to the ground from her recliner.  She denied any overt injury.  Additionally she is endorsing increased swelling in bilateral lower extremities with redness to the posterior right thigh related to skin infection.  She is denying any fevers or chills.  No chest pain or shortness of breath.   Leg Pain   Past Medical History Past Medical History:  Diagnosis Date   Anxiety    Asthma    Atrial fibrillation (HCC)    Depression    Dysrhythmia    new onset Afib rvr   GERD (gastroesophageal reflux disease)    Heart failure (HCC)    Heel spur    bilat   History of bronchitis    History of chicken pox    History of urinary tract infection    Migraines    Plantar fasciitis    bilat   STD (sexually transmitted disease)    chl hx & hsv 1&2   Tremors of nervous system    Urinary incontinence    Patient Active Problem List   Diagnosis Date Noted   Iron deficiency anemia 02/02/2022   Bipolar 1 disorder (HCC) 01/30/2020   Acute pulmonary edema (HCC)     Atrial fibrillation with RVR (HCC) 08/02/2019   Bipolar I disorder, most recent episode (or current) manic (HCC) 04/15/2019   Psychosis (HCC) 04/14/2019   Not well controlled moderate persistent asthma 02/02/2018   Chronic rhinitis 02/02/2018   Status asthmaticus 11/29/2017   Acute respiratory failure with hypoxia (HCC) 11/29/2017   Gastroesophageal reflux disease 09/01/2017   Cough 12/31/2016   Dysuria 07/28/2016   Clinical infection 07/28/2016   History of asthma 04/17/2016   Acute bronchitis 04/17/2016   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 Authorizing Provider  Acetylcysteine 600 MG CAPS Take by mouth. 01/10/22   [provider]  apixaban (ELIQUIS) 5 MG TABS tablet Take 1 tablet (5 mg total) by mouth 2 (two) times daily. 08/05/19 01/26/20  Jae Dire, MD  apixaban (ELIQUIS) 5 MG TABS tablet Take by mouth. 12/10/21  [provider]  ARIPiprazole (ABILIFY) 5 MG tablet Take 1 tablet (5 mg total) by mouth daily. For mood control 02/02/20   Armandina Stammer I, NP  aspirin EC 81 MG tablet Take 81 mg by mouth daily. Swallow whole.    [provider]  busPIRone (BUSPAR) 10 MG tablet TAKE 1 TABLET BY MOUTH TWICE A DAY *CAN TAKE 1/2 TABLET DURING THE DAY IF NEEDED 01/23/22   [provider]  diltiazem (CARDIZEM CD) 240 MG 24 hr capsule Take 1 capsule (240 mg total) by mouth daily. 08/06/19  01/25/21  Jae Dire, MD  dofetilide Joice Lofts) 500 MCG capsule Take by mouth. 01/17/22   [provider]  fluticasone furoate-vilanterol (BREO ELLIPTA) 200-25 MCG/ACT AEPB Inhale 1 puff into the lungs daily. 12/17/21   [provider]  furosemide (LASIX) 80 MG tablet Take 1 tablet (80 mg total) by mouth daily for 7 days. 01/31/22 02/07/22  Rondel Baton, MD  glipiZIDE (GLUCOTROL) 10 MG tablet Take by mouth. 12/02/21 03/02/22  [provider]  hydrOXYzine (ATARAX/VISTARIL) 50 MG tablet Take 1 tablet (50 mg total) by mouth 3 (three) times daily as needed for anxiety. 02/01/20   Armandina Stammer I, NP  iron polysaccharides (NIFEREX) 150 MG capsule Take 150 mg by mouth daily.    [provider]  levalbuterol Pauline Aus HFA) 45 MCG/ACT inhaler Inhale 2 puffs into the lungs every 6 (six) hours as needed for wheezing or shortness of breath.  Patient not taking: Reported on 01/28/2022 06/28/19   [provider]  losartan (COZAAR) 100 MG tablet Take 100 mg by mouth daily.    [provider]  nystatin (MYCOSTATIN/NYSTOP) powder Apply topically 2 (two) times daily. 02/01/20   Armandina Stammer I, NP  pantoprazole (PROTONIX) 40 MG tablet Take 1 tablet (40 mg total) by mouth 2 (two) times daily. Patient taking differently: Take 40 mg by mouth daily as needed (Acid reflux). 04/20/19   Malvin Johns, MD  polyethylene glycol powder [facility]) 17 GM/SCOOP powder Take by mouth. 01/10/22   [provider]  potassium chloride SA (KLOR-CON M) 20 MEQ tablet Take by mouth. 01/11/22   [provider]  pregabalin (LYRICA) 100 MG capsule  01/27/22   [provider]  promethazine (PHENERGAN) 12.5 MG tablet Take by mouth. 01/12/22   [provider]  propranolol (INDERAL) 80 MG tablet Take by mouth. 01/19/22 04/19/22  [provider]  silver sulfADIAZINE (SILVADENE) 1 % cream Apply topically.    [provider]   simethicone (MYLICON) 80 MG chewable tablet Chew by mouth. 01/10/22   [provider]  spironolactone (ALDACTONE) 25 MG tablet Take 0.5 tablets by mouth daily. 01/11/22   [provider]  triamcinolone cream (KENALOG) 0.1 % Apply 1 Application topically 2 (two) times daily.    [provider]  Vitamin D, Ergocalciferol, (DRISDOL) 1.25 MG (50000 UNIT) CAPS capsule Take 50,000 Units by mouth once a week. 01/16/22   [provider]  VOLTAREN ARTHRITIS PAIN 1 % GEL Apply topically. 01/20/22   [provider]  Allergies Gabapentin, Topiramate, Advair diskus [fluticasone-salmeterol], Imitrex [sumatriptan], Latex, Montelukast, Other, Prozac [fluoxetine], Trazodone and nefazodone, and Copper-containing compounds  Review of Systems Review of Systems As noted in HPI  Physical Exam Vital Signs  I have reviewed the triage vital signs BP (!) 109/49   Pulse 77   Temp 98.3 F (36.8 C) (Oral)   Resp 15   SpO2 (!) 86%   Physical Exam Vitals reviewed.  Constitutional:      General: She is not in acute distress.    Appearance: She is well-developed. She is morbidly obese. She is not diaphoretic.  HENT:     Head: Normocephalic and atraumatic.     Nose: Nose normal.  Eyes:     General: No scleral icterus.       Right eye: No discharge.        Left eye: No discharge.     Conjunctiva/sclera: Conjunctivae normal.     Pupils: Pupils are equal, round, and reactive to light.  Cardiovascular:     Rate and Rhythm: Normal rate and regular rhythm.     Heart sounds: No murmur heard.    No friction rub. No gallop.  Pulmonary:     Effort: Pulmonary effort is normal. No respiratory distress.     Breath sounds: Normal breath sounds. No stridor. No rales.  Abdominal:     General: There is no distension.     Palpations: Abdomen is soft.      Tenderness: There is no abdominal tenderness.  Musculoskeletal:     Cervical back: Normal range of motion and neck supple.     Lumbar back: No tenderness or bony tenderness.     Right hip: Tenderness present. Decreased range of motion.     Right lower leg: Swelling present.     Left lower leg: Swelling present.       Legs:     Comments: Erythema to right posterior leg about the knee with streaking up the thigh.   Skin:    General: Skin is warm and dry.     Findings: No erythema or rash.  Neurological:     Mental Status: She is alert and oriented to person, place, and time.     ED Results and Treatments Labs (all labs ordered are listed, but only abnormal results are displayed) Labs Reviewed  BRAIN NATRIURETIC PEPTIDE - Abnormal; Notable for the following components:      Result Value   B Natriuretic Peptide 120.5 (*)    All other components within normal limits  COMPREHENSIVE METABOLIC PANEL - Abnormal; Notable for the following components:   Sodium 134 (*)    Glucose, Bld 138 (*)    Calcium 8.7 (*)    Albumin 3.4 (*)    All other components within normal limits  CBC WITH DIFFERENTIAL/PLATELET - Abnormal; Notable for the following components:   WBC 13.0 (*)    RBC 3.47 (*)    Hemoglobin 8.7 (*)    HCT 30.7 (*)    MCH 25.1 (*)    MCHC 28.3 (*)    RDW 18.6 (*)    Neutro Abs 9.6 (*)    Monocytes Absolute 1.2 (*)    Abs Immature Granulocytes 0.12 (*)    All other components within normal limits  LACTIC ACID, PLASMA - Abnormal; Notable for the following components:   Lactic Acid, Venous 2.0 (*)    All other components within normal limits  I-STAT CHEM 8, ED - Abnormal; Notable for the following components:  Sodium 134 (*)    Chloride 94 (*)    Glucose, Bld 140 (*)    Calcium, Ion 1.14 (*)    Hemoglobin 10.5 (*)    HCT 31.0 (*)    All other components within normal limits  MAGNESIUM  LACTIC ACID, PLASMA  URINALYSIS, ROUTINE W REFLEX MICROSCOPIC  I-STAT BETA  HCG BLOOD, ED ([facility], [facility], [facility] ONLY)                                                                                                                         EKG not atrial fib as noted. Actually NSR   Radiology DG Hip Unilat W or Wo Pelvis 2-3 Views Right  Result Date: 02/16/2022 CLINICAL DATA:  Right hip pain and right leg pain EXAM: DG HIP (WITH OR WITHOUT PELVIS) 2-3V RIGHT COMPARISON:  None Available. FINDINGS: There is no evidence of hip fracture or dislocation. There is no evidence of arthropathy or other focal bone abnormality. IMPRESSION: Negative. Electronically Signed   By: [name] M.D.   On: 02/16/2022 06:30    Medications Ordered in ED Medications  ceFAZolin (ANCEF) IVPB 2g/100 mL premix (has no administration in time range)  furosemide (LASIX) injection 40 mg (40 mg Intravenous Given 02/16/22 0556)  diazepam (VALIUM) injection 5 mg (5 mg Intravenous Given 02/16/22 0550)  ketorolac (TORADOL) 15 MG/ML injection 15 mg (15 mg Intravenous Given 02/16/22 0554)                                                                                                                                     Procedures Procedures  (including critical care time)  Medical Decision Making / ED Course   Medical Decision Making Amount and/or Complexity of Data Reviewed Labs: ordered. Decision-making details documented in ED Course. Radiology: ordered and independent interpretation performed. Decision-making details documented in ED Course. ECG/medicine tests: ordered and independent interpretation performed. Decision-making details documented in ED Course.  Risk Prescription drug management. Decision regarding hospitalization.  Patient presents with right hip pain.  Ongoing for several weeks but worsened today.  No significant trauma but will obtain x-ray to rule out any orthopedic injuries.  -X-ray negative for any acute fracture or dislocation.  -Patient provided with IV Toradol and Valium  for muscle spasms.  -Pain significantly resolved  Additionally patient has significant comorbidities including CHF with bilateral lower extremity edema.  Will obtain labs  to assess for volume status.  -BNP 120  - not concerning for CHF exacerbation.  - since she will be admitted for cellulitis (see below) will give IV lasix.  Patient also has evidence of cellulitis.  Recent labs from PCP on December 7, was notable for leukocytosis.  -CBC with leukocytosis.  Stable hemoglobin.  -Metabolic panel without significant electrolyte derangements or renal sufficiency.  -Lactic acid of 2  -Patient started on IV Ancef for moderate cellulitis, requiring IV abx  - will discuss admission to [facility] service.        Final Clinical Impression(s) / ED Diagnoses Final diagnoses:  Cellulitis of right lower extremity           This chart was dictated using voice recognition software.  Despite best efforts to proofread,  errors can occur which can change the documentation meaning.    Fatima Blank, MD 02/16/22 [phone]

## 2022-02-16 NOTE — Assessment & Plan Note (Signed)
Hgb in ED 8.7. Chart reviewed:           01/09/22    01/28/22     01/29/22     12/10                                                                11.5          9.9             8.1            8.7 No report of bleeding, hematemesis, hematochezia, melena. NO prior information on any GI pathology. Patient asymptomatic.  Plan Anemia panel  H/H daily\  Nursing order - hemetest stool

## 2022-02-16 NOTE — Subjective & Objective (Addendum)
Ms. Aslinger, a60 y/o w/ multiple co-morbidities including morbid obesity, a. Fib, bipolar disorder, HFrEF with EF 30-35% by TEE 01/31/20, was admitted in October for acute HFrEF discharged to SNF. She had recurrent episodes of failure and was treated in ED with diuresis and returned to SNF. She has subsequently returned home. Last PCP OV 02/13/22 for follow up patient c/o leg pain starting in the groin, and recurrent fluid overload. Subsequent LS spine films revealed DJD rated as mild.   Ms. Rotan reports that she has had increased swelling of her legs and weight gain. She also reports that she cannot bend her knee and that the knee "gives out" on her limiting ability to ambulate. She has fallen at home. She presents to [facility] for further evaluation of her leg pain and weight/fluid gain.

## 2022-02-16 NOTE — Assessment & Plan Note (Signed)
Patient c/o severe sharp, lancinating pain radiating from right buttock to leg. Her PCP suggested dx of sciatica. Lumbar x-rays reveal moderate DJD. Her pain is relieved with ketorolac. Mild paresthesia proximal, lateral right thigh.   Plan Continue ketorolac  If weight permits - MRI lumbar spine w/o contrast

## 2022-02-16 NOTE — ED Notes (Signed)
RN is aware of the pt b/p

## 2022-02-16 NOTE — Assessment & Plan Note (Signed)
Continue PPI ?

## 2022-02-16 NOTE — Assessment & Plan Note (Addendum)
Patient has been well managed. A1C 01/31/20 6.1%. She reports A1C in the last two months of 6.1%  Plan Continue home regimen - glipizide Sliding scale coverage

## 2022-02-16 NOTE — Assessment & Plan Note (Addendum)
Patient with h/o acute on chronic HFrEF with last admission Oct '22. She underwent TEE 08/05/19 revealing EF 30-35%, no valvular disease, no DD. She does c/o weight gain and SOB. BNP in ED 120.5. Given compensated state will not order repeat TEE (TTE unlikely to be accurate given obesity)  Plan Continue Bumex at 2 mg/day  Continue Aldactone  25 mg daily  Continue ARB

## 2022-02-16 NOTE — Assessment & Plan Note (Signed)
Morbidly obese patient with increased erythema right proximal LE medially, erythema popliteal fossa right, warm to touch, mild leukocytosis and leg pain.  Plan Ancef 2g IV q 8  Blood cx x 2 sites  F/u CBC  Convert to oral antibiotics if patient has good response.

## 2022-02-16 NOTE — Progress Notes (Signed)
Pt arrived to MRI. Due to the pts body habitus, she was not a candidate for a MRI. Multiple attempts were made to accommodate pt while trying to advance inside the scanner. Ultimately, we could not advance any further. MD and RN aware via secure chart.

## 2022-02-16 NOTE — Assessment & Plan Note (Signed)
Managed by PCP - not currently seeing Cardiologist. She has been tikosyn and beta-blocker. At admission EKG reveal rate controlled a. Fib. She is on eliquis  Plan Continue home medications  No indication for cardiac monitoring with stable heart rate and absence of symptoms

## 2022-02-16 NOTE — Assessment & Plan Note (Signed)
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 Has recently started ozempic  Plan Continue ozempic  Address Bipolar disorder - see below.  Chronic fungal infection(s) intertriginous regions: breast, groin, panniculus: nystatin powder

## 2022-02-16 NOTE — H&P (Signed)
History and Physical    Alyssa Blanchard No [id number] DOB: 11-15-1970 DOA: 02/16/2022  DOS: the patient was seen and examined on 02/16/2022  PCP: Patrecia Pour, Christean Grief, MD   Patient coming from: Home  I have personally briefly reviewed patient's old medical records in [facility]  [name], a65 y/o w/ multiple co-morbidities including morbid obesity, a. Fib, bipolar disorder, HFrEF with EF 30-35% by TEE 01/31/20, was admitted in October for acute HFrEF discharged to SNF. She had recurrent episodes of failure and was treated in ED with diuresis and returned to SNF. She has subsequently returned home. Last PCP OV 02/13/22 for follow up patient c/o leg pain starting in the groin, and recurrent fluid overload. Subsequent LS spine films revealed DJD rated as mild.   [name] reports that she has had increased swelling of her legs and weight gain. She also reports that she cannot bend her knee and that the knee "gives out" on her limiting ability to ambulate. She has fallen at home. She presents to [facility] for further evaluation of her leg pain and weight/fluid gain.    ED Course: Afebrile  109/49  HR 77 RR 15. EDP exam notes obesity, tenderness right hip and erythema posterior right leg and knee. Lab: glucose 138, albumin 3.4, BNP 120.5, WBC 13k with nl differential, lactic acid 2.0, Hgb 8.7 (chronic) over past month. EKG - a fib at 82. In ED first dose Ancef given. TRH called to admit for continued evaluation and treatment of cellulitis  Review of Systems:  Review of Systems  Constitutional:  Negative for chills, fever and weight loss.  HENT: Negative.    Eyes: Negative.   Respiratory: Negative.    Cardiovascular:  Positive for palpitations and leg swelling. Negative for chest pain.  Gastrointestinal:  Negative for diarrhea, heartburn, melena, nausea and vomiting.  Genitourinary: Negative.   Musculoskeletal:  Positive for joint pain and myalgias.       Leg pain, hip pain  Skin:   Positive for itching and rash.       Multiple infected excoriations in healing stage. 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   Neurological:  Positive for sensory change.       Hypoaesthesia poximal, lateral right thigh (ilio-tibial band)  Psychiatric/Behavioral:         Reports manic episodes, depressive episodes, agraphobia.    Past Medical History:  Diagnosis Date   Anxiety    Asthma    Atrial fibrillation ([place])    Depression    DM (diabetes mellitus), type 2 ([place]) 02/16/2022   Dysrhythmia    new onset Afib rvr   GERD (gastroesophageal reflux disease)    Heart failure (HCC)    Heel spur    bilat   History of bronchitis    History of chicken pox    History of urinary tract infection    Migraines    Plantar fasciitis    bilat   STD (sexually transmitted disease)    chl hx & hsv 1&2   Tremors of nervous system    Urinary incontinence     Past Surgical History:  Procedure Laterality Date   CARDIOVERSION N/A 08/05/2019   Procedure: CARDIOVERSION;  Surgeon: Nahser, Wonda Cheng, MD;  Location: [place];  Service: Cardiovascular;  Laterality: N/A;   [place] N/A 11/27/2014   Procedure: LAPAROSCOPIC GASTRIC SLEEVE RESECTION WITH HIATAL HERNIA REPAIR UPPER ENDOSCOPY;  Surgeon: Johnathan Hausen, MD;  Location: [facility];  Service: General;  Laterality: N/A;  TEE WITHOUT CARDIOVERSION N/A 08/05/2019   Procedure: TRANSESOPHAGEAL ECHOCARDIOGRAM (TEE);  Surgeon: Acie Fredrickson Wonda Cheng, MD;  Location: [facility] ENDOSCOPY;  Service: Cardiovascular;  Laterality: N/A;   TONSILLECTOMY  1978    Soc Hx  - never married. Lives with mother, who is in poor health. Recently qualified for medicaid.    reports that she quit smoking about 9 years ago. Her smoking use included cigarettes. She has a 1.25 pack-year smoking history. She has never used smokeless tobacco. She reports that she does not currently use alcohol. She reports that she does not currently use drugs after  having used the following drugs: Marijuana and Cocaine.  Allergies  Allergen Reactions   Gabapentin Anaphylaxis   Topiramate Other (See Comments)    Jaw tightness and chest discomfort    Advair Diskus [Fluticasone-Salmeterol]    Imitrex [Sumatriptan] Other (See Comments)    Increased heart rate   Latex Itching   Montelukast Other (See Comments)    Nightmares    Other Itching and Other (See Comments)    Pt states "no narcotics ever" 04/13/2019- denies this entry in 01/2020 Baking soda- itching "Mental health meds, in general, don't agree with me" Plastic and artificial Christmas trees = Itching   Prozac [Fluoxetine] Other (See Comments)    Bad, vivid dreams   Trazodone And Nefazodone Other (See Comments)    Bad dreams 01/27/20 Pt is unsure if allergic to Nefazodone   Copper-Containing Compounds Rash and Other (See Comments)    Makes the face burn, also    Family History  Problem Relation Age of Onset   Diabetes Mother    Hypertension Mother    Hyperlipidemia Mother    Kidney disease Mother        CKD stage 4   Cancer Father        pancreatic   Hypertension Maternal Grandmother    Diabetes Maternal Grandmother    Heart failure Maternal Grandmother        CHF   Heart attack Maternal Grandfather    Hypertension Maternal Grandfather    Hypertension Paternal Grandmother    Alzheimer's disease Paternal Grandmother    Hypertension Paternal Grandfather    Cancer Maternal Uncle        melanoma   Cancer Paternal Uncle        kidney    Prior to Admission medications   Medication Sig Start Date End Date Taking? Authorizing Provider  acetaminophen (TYLENOL) 500 MG tablet Take 1,000 mg by mouth every 6 (six) hours as needed for mild pain.   Yes [provider]  apixaban (ELIQUIS) 5 MG TABS tablet Take 5 mg by mouth 2 (two) times daily. 12/10/21  Yes [provider]  bumetanide (BUMEX) 2 MG tablet Take 2 mg by mouth daily. 02/13/22  Yes [provider]   busPIRone (BUSPAR) 10 MG tablet Take 5-10 mg by mouth See admin instructions. Taking 10 mg twice daily and can take 5 mg in the afternoon if needed for anxiety. 01/23/22  Yes [provider]  dofetilide (TIKOSYN) 500 MCG capsule Take 500 mcg by mouth 2 (two) times daily. 01/17/22  Yes [provider]  Dupilumab (DUPIXENT) 300 MG/2ML SOPN Inject 300 mg into the skin every 14 (fourteen) days.   Yes [provider]  fluticasone furoate-vilanterol (BREO ELLIPTA) 200-25 MCG/ACT AEPB Inhale 1 puff into the lungs daily. 12/17/21  Yes [provider]  glipiZIDE (GLUCOTROL) 10 MG tablet Take 10 mg by mouth 2 (two) times daily before  a meal. 12/02/21 03/02/22 Yes [provider]  levalbuterol Penne Lash HFA) 45 MCG/ACT inhaler Inhale 2 puffs into the lungs every 6 (six) hours as needed for wheezing or shortness of breath. 06/28/19  Yes [provider]  losartan (COZAAR) 100 MG tablet Take 100 mg by mouth daily.   Yes [provider]  Melatonin 10 MG CAPS Take 10 mg by mouth daily.   Yes [provider]  Multiple Vitamins-Minerals (BARIATRIC MULTIVITAMINS/IRON PO) Take 1 tablet by mouth daily.   Yes [provider]  nystatin (MYCOSTATIN/NYSTOP) powder Apply topically 2 (two) times daily. Patient taking differently: Apply 1 Application topically 2 (two) times daily as needed (skin irritation). 02/01/20  Yes Lindell Spar I, NP  pantoprazole (PROTONIX) 40 MG tablet Take 1 tablet (40 mg total) by mouth 2 (two) times daily. 04/20/19  Yes Johnn Hai, MD  polyethylene glycol powder (GLYCOLAX/MIRALAX) 17 GM/SCOOP powder Take 17 g by mouth daily as needed for mild constipation. 01/10/22  Yes [provider]  potassium chloride SA (KLOR-CON M) 20 MEQ tablet Take 20 mEq by mouth 2 (two) times daily. 01/11/22  Yes [provider]  pregabalin (LYRICA) 100 MG capsule Take 100 mg by mouth daily. 01/27/22  Yes [provider]   promethazine (PHENERGAN) 12.5 MG tablet Take 12.5 mg by mouth every 6 (six) hours as needed for nausea or vomiting. 01/12/22  Yes [provider]  propranolol (INDERAL) 80 MG tablet Take 80 mg by mouth 3 (three) times daily. 01/19/22 04/19/22 Yes [provider]  Semaglutide,0.25 or 0.5MG /DOS, (OZEMPIC, 0.25 OR 0.5 MG/DOSE,) 2 MG/3ML SOPN Inject 0.25 mg into the skin once a week. Wednesday ( 02-19-22) is the last dose will increase to 0.5 mg weekly.   Yes [provider]  silver sulfADIAZINE (SILVADENE) 1 % cream Apply 1 Application topically daily.   Yes [provider]  simethicone (MYLICON) 80 MG chewable tablet Chew 80 mg by mouth every 6 (six) hours as needed for flatulence. 01/10/22  Yes [provider]  spironolactone (ALDACTONE) 25 MG tablet Take 25 mg by mouth daily. 02/13/22  Yes [provider]  triamcinolone cream (KENALOG) 0.1 % Apply 1 Application topically 2 (two) times daily.   Yes [provider]  Vitamin D, Ergocalciferol, (DRISDOL) 1.25 MG (50000 UNIT) CAPS capsule Take 50,000 Units by mouth once a week. Monday 01/16/22  Yes [provider]  VOLTAREN ARTHRITIS PAIN 1 % GEL Apply 2 g topically 4 (four) times daily as needed (knee pain). 01/20/22  Yes [provider]  Acetylcysteine 600 MG CAPS Take by mouth. Patient not taking: Reported on 02/16/2022 01/10/22   [provider]  ARIPiprazole (ABILIFY) 5 MG tablet Take 1 tablet (5 mg total) by mouth daily. For mood control Patient not taking: Reported on 02/16/2022 02/02/20   Lindell Spar I, NP  furosemide (LASIX) 80 MG tablet Take 1 tablet (80 mg total) by mouth daily for 7 days. 01/31/22 02/07/22  Fransico Meadow, MD  hydrOXYzine (ATARAX/VISTARIL) 50 MG tablet Take 1 tablet (50 mg total) by mouth 3 (three) times daily as needed for anxiety. Patient not taking: Reported on 02/16/2022 02/01/20   Lindell Spar I, NP    Physical Exam: Vitals:    02/16/22 0900 02/16/22 0915 02/16/22 0930 02/16/22 0945  BP: (!) 122/59 132/64 117/63 (!) 108/52  Pulse: 73 74 74 75  Resp: 19 15 13 14   Temp:      TempSrc:      SpO2: 100%  100% 100% 100%    Physical Exam Vitals and nursing note reviewed.  Constitutional:      General: She is not in acute distress.    Appearance: She is obese. She is ill-appearing. She is not toxic-appearing.  HENT:     Head: Normocephalic and atraumatic.     Mouth/Throat:     Mouth: Mucous membranes are moist.     Pharynx: Oropharynx is clear.     Comments: Native dentition Eyes:     Extraocular Movements: Extraocular movements intact.     Conjunctiva/sclera: Conjunctivae normal.     Pupils: Pupils are equal, round, and reactive to light.  Neck:     Vascular: No carotid bruit.     Comments: Obese, hindering exam for thyroid, JVD Cardiovascular:     Rate and Rhythm: Normal rate. Rhythm irregular.     Pulses: Normal pulses.     Heart sounds: Normal heart sounds.     Comments: 2+ radial pulse, trace DP pulse. Heart sounds distant Pulmonary:     Effort: Pulmonary effort is normal.     Breath sounds: No wheezing, rhonchi or rales.     Comments: Limited exam - size prevented sitting patient upright for posterior pulmonary exam Abdominal:     General: Bowel sounds are normal.     Palpations: Abdomen is soft.     Tenderness: There is no abdominal tenderness. There is no guarding.     Comments: Massively obese. Large panniculus  Musculoskeletal:     Cervical back: Normal range of motion and neck supple.     Right lower leg: Edema present.     Left lower leg: Edema present.     Comments: Massively obese with very large legs, swollen feet. UE nl. Limited ROM right knee  Skin:    General: Skin is warm.     Findings: Erythema and rash present.     Comments: Multiple excoriations across breasts and abdomin in various stages of healing. Intertriginous zones- under breasts, groins, under panniculus, popliteal  fossa- erythematous moist, w/o odor Erythema, warmth lateral and medial right thigh with some streaking up the lateal thigh (exam hindered by obesity).  Neurological:     General: No focal deficit present.     Mental Status: She is alert and oriented to person, place, and time.  Psychiatric:        Mood and Affect: Mood normal.        Behavior: Behavior normal.      Labs on Admission: I have personally reviewed following labs and imaging studies  CBC: Recent Labs  Lab 02/16/22 0539 02/16/22 0554  WBC 13.0*  --   NEUTROABS 9.6*  --   HGB 8.7* 10.5*  HCT 30.7* 31.0*  MCV 88.5  --   PLT 275  --    Basic Metabolic Panel: Recent Labs  Lab 02/16/22 0539 02/16/22 0554  NA 134* 134*  K 4.1 4.2  CL 98 94*  CO2 26  --   GLUCOSE 138* 140*  BUN 11 8  CREATININE 0.94 0.80  CALCIUM 8.7*  --   MG 2.1  --    GFR: CrCl cannot be calculated (Unknown ideal weight.). Liver Function Tests: Recent Labs  Lab 02/16/22 0539  AST 31  ALT 19  ALKPHOS 74  BILITOT 0.9  PROT 7.6  ALBUMIN 3.4*   No results for input(s): "LIPASE", "AMYLASE" in the last 168 hours. No results for input(s): "AMMONIA" in the last 168 hours. Coagulation Profile: No results for  input(s): "INR", "PROTIME" in the last 168 hours. Cardiac Enzymes: No results for input(s): "CKTOTAL", "CKMB", "CKMBINDEX", "TROPONINI" in the last 168 hours. BNP (last 3 results) No results for input(s): "PROBNP" in the last 8760 hours. HbA1C: No results for input(s): "HGBA1C" in the last 72 hours. CBG: No results for input(s): "GLUCAP" in the last 168 hours. Lipid Profile: No results for input(s): "CHOL", "HDL", "LDLCALC", "TRIG", "CHOLHDL", "LDLDIRECT" in the last 72 hours. Thyroid Function Tests: No results for input(s): "TSH", "T4TOTAL", "FREET4", "T3FREE", "THYROIDAB" in the last 72 hours. Anemia Panel: No results for input(s): "VITAMINB12", "FOLATE", "FERRITIN", "TIBC", "IRON", "RETICCTPCT" in the last 72 hours. Urine  analysis:    Component Value Date/Time   COLORURINE YELLOW 02/16/2022 0757   APPEARANCEUR CLEAR 02/16/2022 0757   LABSPEC <1.005 (L) 02/16/2022 0757   PHURINE 6.0 02/16/2022 0757   GLUCOSEU NEGATIVE 02/16/2022 0757   HGBUR NEGATIVE 02/16/2022 0757   BILIRUBINUR NEGATIVE 02/16/2022 0757   BILIRUBINUR negative 07/28/2016 1614   BILIRUBINUR neg 08/29/2014 1528   KETONESUR NEGATIVE 02/16/2022 0757   PROTEINUR NEGATIVE 02/16/2022 0757   UROBILINOGEN 0.2 07/28/2016 1614   NITRITE NEGATIVE 02/16/2022 0757   LEUKOCYTESUR NEGATIVE 02/16/2022 0757    Radiological Exams on Admission: I have personally reviewed images DG Hip Unilat W or Wo Pelvis 2-3 Views Right  Result Date: 02/16/2022 CLINICAL DATA:  Right hip pain and right leg pain EXAM: DG HIP (WITH OR WITHOUT PELVIS) 2-3V RIGHT COMPARISON:  None Available. FINDINGS: There is no evidence of hip fracture or dislocation. There is no evidence of arthropathy or other focal bone abnormality. IMPRESSION: Negative. Electronically Signed   By: Kennith Center M.D.   On: 02/16/2022 06:30    EKG: I have personally reviewed EKG: A. Fib with controlled rate. No acute changes  Assessment/Plan Active Problems:   Cellulitis of right leg   Morbid obesity (HCC)   Atrial fibrillation with RVR (HCC)   Bipolar 1 disorder (HCC)   Chronic HFrEF (heart failure with reduced ejection fraction) (HCC)   DM (diabetes mellitus), type 2 (HCC)   Lumbar radiculopathy, right   Anemia   Gastroesophageal reflux disease    Assessment and Plan: Cellulitis of right leg Morbidly obese patient with increased erythema right proximal LE medially, erythema popliteal fossa right, warm to touch, mild leukocytosis and leg pain.  Plan Ancef 2g IV q 8  Blood cx x 2 sites  F/u CBC  Convert to oral antibiotics if patient has good response.  Anemia Hgb in ED 8.7. Chart reviewed:           01/09/22    01/28/22     01/29/22     12/10                                                                 11.5          9.9             8.1            8.7 No report of bleeding, hematemesis, hematochezia, melena. NO prior information on any GI pathology. Patient asymptomatic.  Plan Anemia panel  H/H daily\  Nursing order - hemetest stool  Lumbar radiculopathy, right Patient c/o severe sharp, lancinating pain radiating from right  buttock to leg. Her PCP suggested dx of sciatica. Lumbar x-rays reveal moderate DJD. Her pain is relieved with ketorolac. Mild paresthesia proximal, lateral right thigh.   Plan Continue ketorolac  If weight permits - MRI lumbar spine w/o contrast  DM (diabetes mellitus), type 2 ([place]) Patient has been well managed. A1C 01/31/20 6.1%. She reports A1C in the last two months of 6.1%  Plan Continue home regimen - glipizide Sliding scale coverage    Chronic HFrEF (heart failure with reduced ejection fraction) ([place]) Patient with h/o acute on chronic HFrEF with last admission Oct '22. She underwent TEE 08/05/19 revealing EF 30-35%, no valvular disease, no DD. She does c/o weight gain and SOB. BNP in ED 120.5. Given compensated state will not order repeat TEE (TTE unlikely to be accurate given obesity)  Plan Continue Bumex at 2 mg/day  Continue Aldactone  25 mg daily  Continue ARB    Bipolar 1 disorder ([place]) Patient has stopped all psychiatric meds 2/2 side effects. Is not currently seeing psychiatrist or therapist. Discussed with her the importance of resuming psychopharmacologic treatment and cognitive treatment.  Plan Will not reorder psych meds at this time  Will need to  work with [facility] and PCP to establish with psychiatrist.   Atrial fibrillation with RVR ([place]) Managed by PCP - not currently seeing Cardiologist. She has been tikosyn and beta-blocker. At admission EKG reveal rate controlled a. Fib. 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 Has recently started ozempic  Plan Continue ozempic  Address Bipolar disorder - see below.  Chronic fungal infection(s) intertriginous regions: breast, groin, panniculus: nystatin powder  Gastroesophageal reflux disease Continue PPI       DVT prophylaxis: Eliquis Code Status: Full Code Family Communication: spoke with patient mother, Tasa Sadd.  Disposition Plan: TBD  Consults called: non3  Admission status: Inpatient, Med-Surg   Adella Hare, MD [facility] 02/16/2022, 10:19 AM

## 2022-02-16 NOTE — ED Notes (Signed)
Patient transported to MRI 

## 2022-02-16 NOTE — ED Notes (Signed)
Patient states she is retaining fluids.  She reports she went to a friends house today but when she got home she was unable to get into her home due to her right leg not bending.  Patient states her left knee is trying to compensate and "pops out"  patient states she was seen by her primary care MD who thinks she may have sciatica issues based on her sx and spinal xrays.  Patient states tonight her pain started in the right groin and is worse than it has ever been.  She called them back due to falling when her right leg gave out.  She tried to stay home but the pain worsened.  It is now a dull pain in the right leg and she states it is making her cry.  Patient took lyrica 200mg  and tylenol #3 at midnight.  She states this has not helped her pain.  She also reports she has 40 pounds of fluids in her abdomen.

## 2022-02-16 NOTE — ED Notes (Signed)
Spoke with Dr. Debby Bud who reports patient will be med-surg level of care.

## 2022-02-16 NOTE — ED Triage Notes (Addendum)
BIB [facility] from home. Pt recently released home from [facility]. Reports R leg pain and numbness from groin down. Hx of CHF, sciatica, afib, resp failure, and kidney failure. Reports fall earlier tonight as well.

## 2022-02-16 NOTE — Assessment & Plan Note (Signed)
Patient has stopped all psychiatric meds 2/2 side effects. Is not currently seeing psychiatrist or therapist. Discussed with her the importance of resuming psychopharmacologic treatment and cognitive treatment.  Plan Will not reorder psych meds at this time  Will need to  work with [facility] and PCP to establish with psychiatrist.

## 2022-02-17 DIAGNOSIS — Z6841 Body Mass Index (BMI) 40.0 and over, adult: Secondary | ICD-10-CM

## 2022-02-17 DIAGNOSIS — I5042 Chronic combined systolic (congestive) and diastolic (congestive) heart failure: Secondary | ICD-10-CM | POA: Diagnosis not present

## 2022-02-17 DIAGNOSIS — D508 Other iron deficiency anemias: Secondary | ICD-10-CM

## 2022-02-17 DIAGNOSIS — I480 Paroxysmal atrial fibrillation: Secondary | ICD-10-CM

## 2022-02-17 DIAGNOSIS — L03115 Cellulitis of right lower limb: Secondary | ICD-10-CM | POA: Diagnosis not present

## 2022-02-17 DIAGNOSIS — R9431 Abnormal electrocardiogram [ECG] [EKG]: Secondary | ICD-10-CM | POA: Insufficient documentation

## 2022-02-17 DIAGNOSIS — E662 Morbid (severe) obesity with alveolar hypoventilation: Secondary | ICD-10-CM

## 2022-02-17 DIAGNOSIS — F319 Bipolar disorder, unspecified: Secondary | ICD-10-CM | POA: Diagnosis not present

## 2022-02-17 LAB — RENAL FUNCTION PANEL
Albumin: 3.1 g/dL — ABNORMAL LOW (ref 3.5–5.0)
Anion gap: 8 (ref 5–15)
BUN: 10 mg/dL (ref 6–20)
CO2: 29 mmol/L (ref 22–32)
Calcium: 8.3 mg/dL — ABNORMAL LOW (ref 8.9–10.3)
Chloride: 101 mmol/L (ref 98–111)
Creatinine, Ser: 1.01 mg/dL — ABNORMAL HIGH (ref 0.44–1.00)
GFR, Estimated: >60 mL/min (ref >60)
Glucose, Bld: 145 mg/dL — ABNORMAL HIGH (ref 70–99)
Phosphorus: 4.4 mg/dL (ref 2.5–4.6)
Potassium: 3.6 mmol/L (ref 3.5–5.1)
Sodium: 138 mmol/L (ref 135–145)

## 2022-02-17 LAB — CBC
HCT: 28.2 % — ABNORMAL LOW (ref 36.0–46.0)
Hemoglobin: 7.9 g/dL — ABNORMAL LOW (ref 12.0–15.0)
MCH: 25.5 pg — ABNORMAL LOW (ref 26.0–34.0)
MCHC: 28 g/dL — ABNORMAL LOW (ref 30.0–36.0)
MCV: 91 fL (ref 80.0–100.0)
Platelets: 191 10*3/uL (ref 150–400)
RBC: 3.1 MIL/uL — ABNORMAL LOW (ref 3.87–5.11)
RDW: 18.9 % — ABNORMAL HIGH (ref 11.5–15.5)
WBC: 8.4 10*3/uL (ref 4.0–10.5)
nRBC: 0 % (ref 0.0–0.2)

## 2022-02-17 LAB — MAGNESIUM: Magnesium: 2 mg/dL (ref 1.7–2.4)

## 2022-02-17 LAB — HEMOGLOBIN A1C
Hgb A1c MFr Bld: 6.1 % — ABNORMAL HIGH (ref 4.8–5.6)
Mean Plasma Glucose: 128 mg/dL

## 2022-02-17 LAB — GLUCOSE, CAPILLARY
Glucose-Capillary: 104 mg/dL — ABNORMAL HIGH (ref 70–99)
Glucose-Capillary: 131 mg/dL — ABNORMAL HIGH (ref 70–99)

## 2022-02-17 MED ORDER — PREGABALIN 100 MG PO CAPS
100.0000 mg | ORAL_CAPSULE | Freq: Two times a day (BID) | ORAL | Status: DC
  Administered 2022-02-17 – 2022-02-28 (×22): 100 mg via ORAL
  Filled 2022-02-17 (×22): qty 1

## 2022-02-17 MED ORDER — INSULIN ASPART 100 UNIT/ML IJ SOLN: 0.0000 [IU] | Freq: Every day | INTRAMUSCULAR | Status: DC

## 2022-02-17 MED ORDER — KETOROLAC TROMETHAMINE 15 MG/ML IJ SOLN: 15.0000 mg | Freq: Four times a day (QID) | INTRAMUSCULAR | Status: AC | PRN

## 2022-02-17 MED ORDER — PREGABALIN 100 MG PO CAPS: 100.0000 mg | ORAL_CAPSULE | Freq: Two times a day (BID) | ORAL | Status: DC

## 2022-02-17 MED ORDER — POTASSIUM CHLORIDE CRYS ER 20 MEQ PO TBCR
40.0000 meq | EXTENDED_RELEASE_TABLET | Freq: Once | ORAL | Status: AC
  Administered 2022-02-17: 40 meq via ORAL
  Filled 2022-02-17: qty 2

## 2022-02-17 MED ORDER — OXYCODONE HCL 5 MG PO TABS
5.0000 mg | ORAL_TABLET | Freq: Four times a day (QID) | ORAL | Status: DC | PRN
  Administered 2022-02-17 – 2022-02-28 (×38): 5 mg via ORAL
  Filled 2022-02-17 (×38): qty 1

## 2022-02-17 MED ORDER — PANTOPRAZOLE SODIUM 40 MG PO TBEC
40.0000 mg | DELAYED_RELEASE_TABLET | Freq: Every day | ORAL | Status: DC
  Administered 2022-02-17 – 2022-02-28 (×12): 40 mg via ORAL
  Filled 2022-02-17 (×12): qty 1

## 2022-02-17 MED ORDER — INSULIN ASPART 100 UNIT/ML IJ SOLN
0.0000 [IU] | Freq: Three times a day (TID) | INTRAMUSCULAR | Status: DC
  Administered 2022-02-18 – 2022-02-24 (×12): 2 [IU] via SUBCUTANEOUS
  Administered 2022-02-24: 3 [IU] via SUBCUTANEOUS
  Administered 2022-02-24: 2 [IU] via SUBCUTANEOUS
  Administered 2022-02-25: 3 [IU] via SUBCUTANEOUS
  Administered 2022-02-25 – 2022-02-27 (×3): 2 [IU] via SUBCUTANEOUS
  Administered 2022-02-27: 3 [IU] via SUBCUTANEOUS

## 2022-02-17 MED ORDER — ACETAMINOPHEN 500 MG PO TABS
1000.0000 mg | ORAL_TABLET | Freq: Four times a day (QID) | ORAL | Status: DC | PRN
  Administered 2022-02-17 – 2022-02-28 (×24): 1000 mg via ORAL
  Filled 2022-02-17 (×24): qty 2

## 2022-02-17 MED ORDER — DICLOFENAC SODIUM 1 % EX GEL: 2.0000 g | Freq: Four times a day (QID) | CUTANEOUS | Status: DC | PRN

## 2022-02-17 MED ORDER — SODIUM CHLORIDE 0.9 % IV SOLN
250.0000 mg | Freq: Every day | INTRAVENOUS | Status: AC
  Administered 2022-02-17 – 2022-02-18 (×2): 250 mg via INTRAVENOUS
  Filled 2022-02-17 (×2): qty 20

## 2022-02-17 MED ORDER — LOSARTAN POTASSIUM 50 MG PO TABS: 50.0000 mg | ORAL_TABLET | Freq: Every day | ORAL | Status: DC

## 2022-02-17 NOTE — Evaluation (Signed)
Occupational Therapy Evaluation Patient Details Name: Alyssa Blanchard MRN: [id number] DOB: 03-Nov-1970 Today's Date: 02/17/2022   History of Present Illness Patient is a 51 year old female who presented after fall at home with weight gain and leg pain. Patient unable to go to MRI dur to body habitus. X-ray revealed moderate DJD of lumbar. Patient was admitted with cellulitis of R leg, anemia, and right lumbar radiculopathy. JTT:[id number] disorder, HFrEF, a fib, obesity,   Clinical Impression   Patient is a 51 year old female who was admitted for above. Patient was living at home with mother and cat with recent transition home from SNF. Patient was +2 for bed mobility with sitting EOB deferred with concerns over safety. Patient was noted to have decreased functional activity tolernace, decreased endurance, decreased sitting balance, decreased standing balanced, decreased safety awareness, and decreased knowledge of AE/AD impacting participation in ADLs. Patient would continue to benefit from skilled OT services at this time while admitted and after d/c to address noted deficits in order to improve overall safety and independence in ADLs.         Recommendations for follow up therapy are one component of a multi-disciplinary discharge planning process, led by the attending physician.  Recommendations may be updated based on patient status, additional functional criteria and insurance authorization.   Follow Up Recommendations  Skilled nursing-short term rehab (<3 hours/day)     Assistance Recommended at Discharge Frequent or constant Supervision/Assistance  Patient can return home with the following Two people to help with walking and/or transfers;Two people to help with bathing/dressing/bathroom;Direct supervision/assist for financial management;Help with stairs or ramp for entrance;Assist for transportation;Direct supervision/assist for medications management;Assistance with  cooking/housework    Functional Status Assessment  Patient has had a recent decline in their functional status and demonstrates the ability to make significant improvements in function in a reasonable and predictable amount of time.  Equipment Recommendations  None recommended by OT    Recommendations for Other Services       Precautions / Restrictions Precautions Precautions: Fall Restrictions Weight Bearing Restrictions: No      Mobility Bed Mobility Overal bed mobility: Needs Assistance Bed Mobility: Rolling Rolling: +2 for safety/equipment, +2 for physical assistance, Mod assist         General bed mobility comments: pt able to initiate reaching Rt/Lt assist needed to fully turn upper trunk to grasp bed rail. Pt attempting to weight shift LE's across body to further roll. +2 Mod assist to complete.    Transfers                          Balance                                           ADL either performed or assessed with clinical judgement   ADL Overall ADL's : Needs assistance/impaired Eating/Feeding: Modified independent;Sitting   Grooming: Set up;Bed level   Upper Body Bathing: Bed level;Moderate assistance   Lower Body Bathing: Bed level;Total assistance   Upper Body Dressing : Bed level;Maximal assistance   Lower Body Dressing: Bed level;Total assistance   Toilet Transfer: +2 for physical assistance;+2 for safety/equipment Toilet Transfer Details (indicate cue type and reason): patient was able to roll with +2 assistance in bed with use od bed rails with cues for proper positioning. KREG bed being ordered to  make it safe for patient to transiton to EOB Toileting- Clothing Manipulation and Hygiene: Bed level;Total assistance       Functional mobility during ADLs: +2 for physical assistance;+2 for safety/equipment       Vision Patient Visual Report: No change from baseline       Perception     Praxis       Pertinent Vitals/Pain Pain Assessment Pain Assessment: No/denies pain     Hand Dominance Right   Extremity/Trunk Assessment Upper Extremity Assessment Upper Extremity Assessment: Overall WFL for tasks assessed (with soft tissue impacting ROM of BUE)   Lower Extremity Assessment Lower Extremity Assessment: Defer to PT evaluation   Cervical / Trunk Assessment Cervical / Trunk Assessment: Other exceptions Cervical / Trunk Exceptions: habitus   Communication Communication Communication: No difficulties   Cognition Arousal/Alertness: Awake/alert Behavior During Therapy: WFL for tasks assessed/performed Overall Cognitive Status: Within Functional Limits for tasks assessed                                       General Comments  patient was noted to have open places behind R knee with RN aware. patient noted to have open places under abdomen and thighs.    Exercises     Shoulder Instructions      Home Living Family/patient expects to be discharged to:: Skilled nursing facility Living Arrangements: Parent Available Help at Discharge: Family Type of Home: House Home Access: Stairs to enter Secretary/administrator of Steps: 4+1 Entrance Stairs-Rails: Left;Right Home Layout: One level     Bathroom Shower/Tub: Chief Strategy Officer: Handicapped height Bathroom Accessibility: Yes   Home Equipment: Agricultural consultant (2 wheels);Shower seat   Additional Comments: Pt reports she lives with her mom. her mother is in poor health as well and has stage IV CKD, pt's mother can not help pt. they also have a cat who the pt's mom cares for.      Prior Functioning/Environment Prior Level of Function : Independent/Modified Independent             Mobility Comments: pt sleeps in a recliner and mobilizes in home with RW, reports none of her DME is bariatric. ADLs Comments: pt reports she showers on good days but it is a long task. batrhoom has a shower  seat but not grab bars and she has to step into/out of a tub-shower.        OT Problem List: Decreased activity tolerance;Impaired balance (sitting and/or standing);Decreased coordination;Decreased knowledge of precautions;Decreased knowledge of use of DME or AE;Obesity      OT Treatment/Interventions: Self-care/ADL training;Energy conservation;Therapeutic exercise;Therapeutic activities;Patient/family education;DME and/or AE instruction;Balance training    OT Goals(Current goals can be found in the care plan section) Acute Rehab OT Goals Patient Stated Goal: to get stronger OT Goal Formulation: With patient Time For Goal Achievement: 03/03/22 Potential to Achieve Goals: Fair  OT Frequency: Min 2X/week    Co-evaluation PT/OT/SLP Co-Evaluation/Treatment: Yes Reason for Co-Treatment: For patient/therapist safety;To address functional/ADL transfers PT goals addressed during session: Mobility/safety with mobility OT goals addressed during session: ADL's and self-care      AM-PAC OT "6 Clicks" Daily Activity     Outcome Measure Help from another person eating meals?: A Little Help from another person taking care of personal grooming?: A Little Help from another person toileting, which includes using toliet, bedpan, or urinal?: Total Help from another person bathing (  including washing, rinsing, drying)?: Total Help from another person to put on and taking off regular upper body clothing?: A Lot Help from another person to put on and taking off regular lower body clothing?: Total 6 Click Score: 11   End of Session Nurse Communication: Other (comment) (skin concerns and assist level in bed.)  Activity Tolerance: Patient tolerated treatment well Patient left: in bed;with call bell/phone within reach  OT Visit Diagnosis: Unsteadiness on feet (R26.81);Other abnormalities of gait and mobility (R26.89);Muscle weakness (generalized) (M62.81);History of falling (Z91.81);Pain Pain -  Right/Left: Right Pain - part of body: Knee                Time: 5465-6812 OT Time Calculation (min): 25 min Charges:  OT General Charges $OT Visit: 1 Visit OT Evaluation $OT Eval Moderate Complexity: 1 Mod  Hanni Milford OTR/L, MS Acute Rehabilitation Department Office# [phone]   Selinda Flavin 02/17/2022, 2:39 PM

## 2022-02-17 NOTE — Evaluation (Signed)
Physical Therapy Evaluation Patient Details Name: Alyssa Blanchard MRN: [id number] DOB: 16-Apr-1970 Today's Date: 02/17/2022  History of Present Illness  Patient is a 51 y.o. female with PMH significant for multiple co-morbidities including morbid obesity, a. Fib, bipolar disorder, HFrEF with EF 30-35% by TEE 01/31/20. Pt recently admitted in October for acute HFrEF discharged to SNF for ~4 weeks of rehab and then discharged home. She had recurrent episodes of failure and was treated in ED with diuresis and returned to SNF. Pt reports she has had increased swelling of her legs and weight gain. She also reports that she cannot bend her Rt knee and that the knee "gives out" on her limiting ability to ambulate. She presents to [facility] for further evaluation of her leg pain and weight/fluid gain secondary to fall at home.   Clinical Impression  Alyssa Blanchard is 51 y.o. female admitted with above HPI and diagnosis. Patient is currently limited by functional impairments below (see PT problem list). Patient lives with her mother and is modified independent with RW for household mobility at baseline. Pt has been limited since hospital admission in October. Pt on bariatric bed and concern for sliding out of bed while sitting EOB due to poor positioning. Alternative bariatric bed with chair egress feature (EZ wider) ordered to improve safety with OOB transfers. Pt also noted to have sliding pad from ED under her and notified RN to remove after transfer from current bed to [place] wider as this could lead to increased skin breakdown if left under the pt. Patient will benefit from continued skilled PT interventions to address impairments and progress independence with mobility, recommending SNF for rehab prior to return home. Acute PT will follow and progress as able.        Recommendations for follow up therapy are one component of a multi-disciplinary discharge planning process, led by the attending physician.   Recommendations may be updated based on patient status, additional functional criteria and insurance authorization.  Follow Up Recommendations Skilled nursing-short term rehab (<3 hours/day) Can patient physically be transported by private vehicle: No    Assistance Recommended at Discharge Frequent or constant Supervision/Assistance  Patient can return home with the following  Two people to help with walking and/or transfers;Two people to help with bathing/dressing/bathroom;Assistance with cooking/housework;Assist for transportation;Help with stairs or ramp for entrance    Equipment Recommendations None recommended by PT  Recommendations for Other Services       Functional Status Assessment Patient has had a recent decline in their functional status and demonstrates the ability to make significant improvements in function in a reasonable and predictable amount of time.     Precautions / Restrictions Precautions Precautions: Fall Restrictions Weight Bearing Restrictions: No      Mobility  Bed Mobility Overal bed mobility: Needs Assistance Bed Mobility: Rolling Rolling: +2 for safety/equipment, +2 for physical assistance, Mod assist         General bed mobility comments: pt able to initiate reaching Rt/Lt assist needed to fully turn upper trunk to grasp bed rail. Pt attempting to weight shift LE's across body to further roll. +2 Mod assist to complete.    Transfers                   General transfer comment: deffered due to safety concerns on bed    Ambulation/Gait                  Stairs  Wheelchair Mobility    Modified Rankin (Stroke Patients Only)       Balance                                             Pertinent Vitals/Pain Pain Assessment Pain Assessment: No/denies pain    Home Living Family/patient expects to be discharged to:: Skilled nursing facility Living Arrangements: Parent Available Help at  Discharge: Family Type of Home: House Home Access: Stairs to enter Entrance Stairs-Rails: Chemical engineer of Steps: 4+1   Home Layout: One level (sunk in living room (1 step)) Home Equipment: Conservation officer, nature (2 wheels);Shower seat (standard width) Additional Comments: Pt reports she lives with her mom. her mother is in poor health as well and has stage IV CKD, pt's mother can not help pt. they also have a cat who the pt's mom cares for.    Prior Function Prior Level of Function : Independent/Modified Independent             Mobility Comments: pt sleeps in a recliner and mobilizes in home with RW, reports none of her DME is bariatric. ADLs Comments: pt reports she showers on good days but it is a long task. batrhoom has a shower seat but not grab bars and she has to step into/out of a tub-shower.     Hand Dominance   Dominant Hand: Right    Extremity/Trunk Assessment   Upper Extremity Assessment Upper Extremity Assessment: Defer to OT evaluation    Lower Extremity Assessment Lower Extremity Assessment: RLE deficits/detail;LLE deficits/detail RLE Deficits / Details: knee flexion ROM limited ~45 degrees, ankle DF/PF 4/5. RLE: Unable to fully assess due to pain (at knee and thigh) RLE Sensation: decreased light touch (impaired light touch in Rt lateral thigh) RLE Coordination: WNL LLE Deficits / Details: overall good strength supine testing 4/5 throughout hip abd/add, knee flex/ext, ankle DF/PF. ROM limited by habitus. LLE Sensation: WNL LLE Coordination: WNL (limited by habitus)    Cervical / Trunk Assessment Cervical / Trunk Assessment: Other exceptions Cervical / Trunk Exceptions: habitus  Communication   Communication: No difficulties  Cognition Arousal/Alertness: Awake/alert Behavior During Therapy: WFL for tasks assessed/performed Overall Cognitive Status: Within Functional Limits for tasks assessed                                           General Comments General comments (skin integrity, edema, etc.): pt wtih rash like open wounds around abdoment and thighs. draining wound in back of Rt knee noted, RN aware. dermatherapy linens used for skin fold moisture wicking.    Exercises     Assessment/Plan    PT Assessment Patient needs continued PT services  PT Problem List Decreased strength;Decreased range of motion;Decreased activity tolerance;Decreased balance;Decreased mobility;Decreased knowledge of use of DME;Decreased safety awareness;Decreased knowledge of precautions;Cardiopulmonary status limiting activity;Impaired sensation;Obesity;Decreased skin integrity       PT Treatment Interventions DME instruction;Gait training;Stair training;Functional mobility training;Therapeutic activities;Therapeutic exercise;Balance training;Neuromuscular re-education;Patient/family education    PT Goals (Current goals can be found in the Care Plan section)  Acute Rehab PT Goals Patient Stated Goal: get walking and home, not go to [facility] PT Goal Formulation: With patient Time For Goal Achievement: 03/03/22 Potential to Achieve Goals: Good    Frequency Min 3X/week  Co-evaluation PT/OT/SLP Co-Evaluation/Treatment: Yes Reason for Co-Treatment: For patient/therapist safety;To address functional/ADL transfers PT goals addressed during session: Mobility/safety with mobility;Strengthening/ROM OT goals addressed during session: Strengthening/ROM;ADL's and self-care       AM-PAC PT "6 Clicks" Mobility  Outcome Measure Help needed turning from your back to your side while in a flat bed without using bedrails?: Total Help needed moving from lying on your back to sitting on the side of a flat bed without using bedrails?: Total Help needed moving to and from a bed to a chair (including a wheelchair)?: Total Help needed standing up from a chair using your arms (e.g., wheelchair or bedside chair)?: Total Help needed to  walk in hospital room?: Total Help needed climbing 3-5 steps with a railing? : Total 6 Click Score: 6    End of Session Equipment Utilized During Treatment: Oxygen Activity Tolerance: Patient tolerated treatment well Patient left: in bed;with call bell/phone within reach Nurse Communication: Mobility status (new bed ordered) PT Visit Diagnosis: Unsteadiness on feet (R26.81);Muscle weakness (generalized) (M62.81);Difficulty in walking, not elsewhere classified (R26.2);History of falling (Z91.81);Other (comment) (morbid obesity)    Time: 2505-3976 PT Time Calculation (min) (ACUTE ONLY): 27 min   Charges:   PT Evaluation $PT Eval Moderate Complexity: 1 Mod          Wynn Maudlin, DPT Acute Rehabilitation Services Office [phone]  02/17/22 12:20 PM

## 2022-02-17 NOTE — Progress Notes (Signed)
PROGRESS NOTE  Alyssa Blanchard [id number] DOB: October 26, 1970   PCP: Patrecia Pour, Christean Grief, MD  Patient is from: Lives with mother.  Reports using rolling walker at baseline  DOA: 02/16/2022 LOS: 1  Chief complaints Chief Complaint  Patient presents with   Leg Pain     Brief Narrative / Interim history: 51 year old F with PMH of combined CHF, morbid obesity, A-fib on Eliquis, possible OHS, prolonged QT, lumbar radiculopathy, BPD, fibromyalgia and iron deficiency anemia presenting with bilateral leg swelling,, weight gain, right thigh pain and recurrent falls at home, and admitted for right lower extremity cellulitis, and acute on chronic right lumbar radiculopathy.  She was started on IV Ancef.  MRI lumbar spine ordered but patient was not able to feel MRI due to morbid obesity.  Subjective: Seen and examined earlier this morning.  No major events overnight of this morning.  Continues to endorse pain from right hip to right medial thigh.  She describes the pain as burning.  She rates her pain 6/10.  Denies shortness of breath, chest pain, GI or UTI symptoms.  Phlebotomy not able to get blood draw this morning.  Objective: Vitals:   02/17/22 0156 02/17/22 0500 02/17/22 0550 02/17/22 0911  BP: (!) 88/38 (!) 105/41    Pulse: 81 74    Resp: 14 18    Temp:      TempSrc:  Oral    SpO2: 90% 90% 95% 97%  Weight:      Height:        Examination:  GENERAL: No apparent distress.  Nontoxic. HEENT: MMM.  Vision and hearing grossly intact.  NECK: Supple.  Difficult to JVD RESP:  No IWOB.  Fair aeration bilaterally. CVS:  RRR. Heart sounds normal.  ABD/GI/GU: BS+. Abd soft, NTND.  Difficult exam. MSK/EXT:  Moves extremities some but symmetric.  BLE edema.  Difficult to assess due to body habitus.  SKIN: Mild erythema in right proximal thigh medially. NEURO: Awake, alert and oriented appropriately.  No apparent focal neuro deficit. PSYCH: Calm. Normal affect.   Procedures:   None  Microbiology summarized: None  Assessment and plan: Principal Problem:   Cellulitis of right leg Active Problems:   Class 3 severe obesity due to excess calories with serious comorbidity and body mass index (BMI) greater than or equal to 70 in adult [facility])   Paroxysmal atrial fibrillation (HCC)   Bipolar 1 disorder (HCC)   Chronic combined systolic and diastolic congestive heart failure (HCC)   Type 2 diabetes mellitus without complication, without long-term current use of insulin (HCC)   Lumbar radiculopathy, right   Anemia   Gastroesophageal reflux disease   Hypoventilation associated with obesity syndrome (HCC)   Iron deficiency anemia   Prolonged QT interval  Possible right leg cellulitis: Has erythema right proximal LE medially, erythema popliteal fossa right, warm to touch, mild leukocytosis and leg pain although pain seems to be radiculopathic.  Slightly hypotensive but difficult to get BP due to body habitus.  Also difficult to get morning labs due to poor access. -Continue IV Ancef -IV diuretics based on renal function.  -Leg elevation as able  Lumbar radiculopathy, right: Seems like chronic issue.  She is already on Lyrica.  Not able to get lumbar MRI due to body habitus. -May need open MRI outpatient. -Continue Lyrica -Continue Toradol -PT/OT   Iron deficiency anemia: H&H seems to be better than baseline.  Anemia panel with significant iron deficiency. Recent Labs    01/28/22 1623 01/29/22 0026  01/31/22 1250 02/16/22 0539 02/16/22 0554 02/17/22 1042  HGB 9.9* 8.1* 8.3* 8.7* 10.5* 7.9*  -IV ferric gluconate 250 mg daily x 2 -P.o. Protonix -Monitor H&H   Controlled NIDDM-2: A1c 6.1% -Hold home glipizide -SSI-moderate  Chronic combined CHF: TTE in 2021 with LVEF of 30 to 35%, GH and normal RV function.Marland Kitchen.  Presents with BLE edema and weight gain but not able to elaborate how much she gained.  Difficult to assess fluid status.  No significant  cardiopulmonary symptoms. -Continue home Bumex and Aldactone. -Hold losartan due to low blood pressure -Discontinue Cardizem CD.  Does not look like she is on this at home.  Paroxysmal A-fib/prolonged QT.  Rate controlled.  Not in RVR.  QTc 498. -Change level of care to telemetry -Cardiac monitor -Continue Tikosyn, Inderal and Eliquis -Discontinue Cardizem CD.  Does not look like she is on this at home. -Optimize electrolytes.  Bipolar 1 disorder: stopped all psychiatric meds 2/2 side effects. Is not currently seeing psychiatrist or therapist. Discussed with her the importance of resuming psychopharmacologic treatment and cognitive treatment. -Will not reorder psych meds at this time -TOC and PCP to establish with psychiatrist.   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 -PT/OT  Obesity hypoventilation syndrome/possible OSA -Trial of CPAP at night -Needs sleep study.   Morbid obesity Body mass index is 94.72 kg/m. -Continue Ozempic -Address bipolar disorder          DVT prophylaxis:   apixaban (ELIQUIS) tablet 5 mg  Code Status: Full code Family Communication: None at bedside Level of care: Telemetry Status is: Inpatient Remains inpatient appropriate because: Right lower extremity cellulitis   Final disposition: TBD Consultants:  None  Sch Meds:  Scheduled Meds:  apixaban  5 mg Oral BID   bumetanide  2 mg Oral Daily   dofetilide  500 mcg Oral BID   fluticasone furoate-vilanterol  1 puff Inhalation Daily   insulin aspart  0-15 Units Subcutaneous TID WC   insulin aspart  0-5 Units Subcutaneous QHS   iron polysaccharides  150 mg Oral Daily   ketorolac  15 mg Intravenous Q6H   melatonin  10 mg Oral QHS   nystatin   Topical BID   pantoprazole  40 mg Oral Daily   polyethylene glycol  17  g Oral Daily   pregabalin  100 mg Oral Daily   propranolol  80 mg Oral TID   sodium chloride flush  3 mL Intravenous Q12H   spironolactone  25 mg Oral Daily   Continuous Infusions:  sodium chloride 10 mL (02/16/22 1530)    ceFAZolin (ANCEF) IV 2 g (02/17/22 0515)   ferric gluconate (FERRLECIT) IVPB 250 mg (02/17/22 0842)   PRN Meds:.sodium chloride, hydrOXYzine, simethicone, sodium chloride flush, zolpidem  Antimicrobials: Anti-infectives (From admission, onward)    Start     Dose/Rate Route Frequency Ordered Stop   02/16/22 1400  ceFAZolin (ANCEF) IVPB 2g/100 mL premix        2 g 200 mL/hr over 30 Minutes Intravenous Every 8 hours 02/16/22 0938 02/23/22 1359   02/16/22 0930  ceFAZolin (ANCEF) IVPB 2g/100 mL premix  Status:  Discontinued        2 g 200 mL/hr over 30 Minutes Intravenous Every 8 hours 02/16/22 0929 02/16/22 0938   02/16/22 0715  ceFAZolin (ANCEF) IVPB 2g/100 mL premix        2  g 200 mL/hr over 30 Minutes Intravenous  Once 02/16/22 0709 02/16/22 0830        I have personally reviewed the following labs and images: CBC: Recent Labs  Lab 02/16/22 0539 02/16/22 0554 02/17/22 1042  WBC 13.0*  --  8.4  NEUTROABS 9.6*  --   --   HGB 8.7* 10.5* 7.9*  HCT 30.7* 31.0* 28.2*  MCV 88.5  --  91.0  PLT 275  --  191   BMP &GFR Recent Labs  Lab 02/16/22 0539 02/16/22 0554  NA 134* 134*  K 4.1 4.2  CL 98 94*  CO2 26  --   GLUCOSE 138* 140*  BUN 11 8  CREATININE 0.94 0.80  CALCIUM 8.7*  --   MG 2.1  --    Estimated Creatinine Clearance: 151.4 mL/min (by C-G formula based on SCr of 0.8 mg/dL). Liver & Pancreas: Recent Labs  Lab 02/16/22 0539  AST 31  ALT 19  ALKPHOS 74  BILITOT 0.9  PROT 7.6  ALBUMIN 3.4*   No results for input(s): "LIPASE", "AMYLASE" in the last 168 hours. No results for input(s): "AMMONIA" in the last 168 hours. Diabetic: Recent Labs    02/16/22 1414  HGBA1C 6.1*   No results for input(s): "GLUCAP" in the last 168  hours. Cardiac Enzymes: No results for input(s): "CKTOTAL", "CKMB", "CKMBINDEX", "TROPONINI" in the last 168 hours. No results for input(s): "PROBNP" in the last 8760 hours. Coagulation Profile: No results for input(s): "INR", "PROTIME" in the last 168 hours. Thyroid Function Tests: No results for input(s): "TSH", "T4TOTAL", "FREET4", "T3FREE", "THYROIDAB" in the last 72 hours. Lipid Profile: No results for input(s): "CHOL", "HDL", "LDLCALC", "TRIG", "CHOLHDL", "LDLDIRECT" in the last 72 hours. Anemia Panel: Recent Labs    02/16/22 1414  VITAMINB12 332  FOLATE 18.3  FERRITIN 22  TIBC 473*  IRON 32  RETICCTPCT 4.0*   Urine analysis:    Component Value Date/Time   COLORURINE YELLOW 02/16/2022 0757   APPEARANCEUR CLEAR 02/16/2022 0757   LABSPEC <1.005 (L) 02/16/2022 0757   PHURINE 6.0 02/16/2022 0757   GLUCOSEU NEGATIVE 02/16/2022 0757   HGBUR NEGATIVE 02/16/2022 0757   BILIRUBINUR NEGATIVE 02/16/2022 0757   BILIRUBINUR negative 07/28/2016 1614   BILIRUBINUR neg 08/29/2014 1528   KETONESUR NEGATIVE 02/16/2022 0757   PROTEINUR NEGATIVE 02/16/2022 0757   UROBILINOGEN 0.2 07/28/2016 1614   NITRITE NEGATIVE 02/16/2022 0757   LEUKOCYTESUR NEGATIVE 02/16/2022 0757   Sepsis Labs: Invalid input(s): "PROCALCITONIN", "LACTICIDVEN"  Microbiology: No results found for this or any previous visit (from the past 240 hour(s)).  Radiology Studies: No results found.    Narda Fundora T. Tiffanye Hartmann [facility]  If 7PM-7AM, please contact night-coverage [email or web address] 02/17/2022, 11:27 AM

## 2022-02-18 DIAGNOSIS — I5042 Chronic combined systolic (congestive) and diastolic (congestive) heart failure: Secondary | ICD-10-CM | POA: Diagnosis not present

## 2022-02-18 DIAGNOSIS — F319 Bipolar disorder, unspecified: Secondary | ICD-10-CM | POA: Diagnosis not present

## 2022-02-18 DIAGNOSIS — L03115 Cellulitis of right lower limb: Secondary | ICD-10-CM | POA: Diagnosis not present

## 2022-02-18 DIAGNOSIS — E559 Vitamin D deficiency, unspecified: Secondary | ICD-10-CM

## 2022-02-18 LAB — GLUCOSE, CAPILLARY
Glucose-Capillary: 108 mg/dL — ABNORMAL HIGH (ref 70–99)
Glucose-Capillary: 115 mg/dL — ABNORMAL HIGH (ref 70–99)
Glucose-Capillary: 139 mg/dL — ABNORMAL HIGH (ref 70–99)
Glucose-Capillary: 107 mg/dL — ABNORMAL HIGH (ref 70–99)

## 2022-02-18 LAB — CBC
HCT: 29.4 % — ABNORMAL LOW (ref 36.0–46.0)
Hemoglobin: 8.1 g/dL — ABNORMAL LOW (ref 12.0–15.0)
MCH: 25.3 pg — ABNORMAL LOW (ref 26.0–34.0)
MCHC: 27.6 g/dL — ABNORMAL LOW (ref 30.0–36.0)
MCV: 91.9 fL (ref 80.0–100.0)
Platelets: 170 10*3/uL (ref 150–400)
RBC: 3.2 MIL/uL — ABNORMAL LOW (ref 3.87–5.11)
RDW: 18.5 % — ABNORMAL HIGH (ref 11.5–15.5)
WBC: 8.9 10*3/uL (ref 4.0–10.5)
nRBC: 0 % (ref 0.0–0.2)

## 2022-02-18 LAB — BASIC METABOLIC PANEL
Anion gap: 7 (ref 5–15)
BUN: 7 mg/dL (ref 6–20)
CO2: 31 mmol/L (ref 22–32)
Calcium: 8.5 mg/dL — ABNORMAL LOW (ref 8.9–10.3)
Chloride: 100 mmol/L (ref 98–111)
Creatinine, Ser: 0.78 mg/dL (ref 0.44–1.00)
GFR, Estimated: >60 mL/min (ref >60)
Glucose, Bld: 127 mg/dL — ABNORMAL HIGH (ref 70–99)
Potassium: 3.9 mmol/L (ref 3.5–5.1)
Sodium: 138 mmol/L (ref 135–145)

## 2022-02-18 LAB — LIPID PANEL
Cholesterol: 134 mg/dL (ref 0–200)
HDL: 26 mg/dL — ABNORMAL LOW (ref >40)
LDL Cholesterol: 86 mg/dL (ref 0–99)
Total CHOL/HDL Ratio: 5.2 RATIO
Triglycerides: 111 mg/dL (ref <150)
VLDL: 22 mg/dL (ref 0–40)

## 2022-02-18 LAB — VITAMIN D 25 HYDROXY (VIT D DEFICIENCY, FRACTURES): Vit D, 25-Hydroxy: 35.95 ng/mL (ref 30–100)

## 2022-02-18 LAB — MAGNESIUM: Magnesium: 1.9 mg/dL (ref 1.7–2.4)

## 2022-02-18 LAB — TSH: TSH: 2.307 u[IU]/mL (ref 0.350–4.500)

## 2022-02-18 LAB — CK: Total CK: 104 U/L (ref 38–234)

## 2022-02-18 MED ORDER — BARIATRIC MULTIVITAMINS/IRON PO CAPS: ORAL_CAPSULE | Freq: Every day | ORAL | Status: DC

## 2022-02-18 MED ORDER — ADVANCED MULTI EA PO CHEW
2.0000 | CHEWABLE_TABLET | Freq: Every day | ORAL | Status: DC
  Administered 2022-02-18 – 2022-02-28 (×11): 2 via ORAL
  Filled 2022-02-18 (×11): qty 2

## 2022-02-18 MED ORDER — POTASSIUM CHLORIDE CRYS ER 20 MEQ PO TBCR
40.0000 meq | EXTENDED_RELEASE_TABLET | Freq: Every day | ORAL | Status: DC
  Administered 2022-02-18 – 2022-02-20 (×3): 40 meq via ORAL
  Filled 2022-02-18 (×3): qty 2

## 2022-02-18 NOTE — NC FL2 (Signed)
[place] MEDICAID FL2 LEVEL OF CARE FORM     IDENTIFICATION  Patient Name: Alyssa Blanchard Birthdate: October 02, 1970 Sex: female Admission Date (Current Location): 02/16/2022  [place] and [place] Number:  [name] [id number] [place] and Address:  [facility],  [place], [place]      Provider Number: [id number]  Attending Physician Name and Address:  Mercy Riding, MD  Relative Name and Phone Number:  Maelena Haushalter (mother) Ph: [phone]    Current Level of Care: Hospital Recommended Level of Care: [place] Prior Approval Number:    Date Approved/Denied:   PASRR Number: [id number]  Discharge Plan: SNF    Current Diagnoses: Patient Active Problem List   Diagnosis Date Noted   Prolonged QT interval 02/17/2022   Cellulitis of right leg 02/16/2022   Lumbar radiculopathy, right 02/16/2022   Anemia 02/16/2022   Iron deficiency anemia 02/02/2022   Type 2 diabetes mellitus without complication, without long-term current use of insulin ([place]) 12/25/2021   Bipolar 1 disorder ([place]) 01/30/2020   Chronic combined systolic and diastolic congestive heart failure ([place]) 09/25/2019   Acute pulmonary edema ([place])    Paroxysmal atrial fibrillation ([place]) 08/02/2019   Bipolar I disorder, most recent episode (or current) manic ([place]) 04/15/2019   Psychosis ([place]) 04/14/2019   Not well controlled moderate persistent asthma 02/02/2018   Chronic rhinitis 02/02/2018   Status asthmaticus 11/29/2017   Acute respiratory failure with hypoxia ([place]) 11/29/2017   Gastroesophageal reflux disease 09/01/2017   Cough 12/31/2016   Dysuria 07/28/2016   Clinical infection 07/28/2016   History of asthma 04/17/2016   Acute bronchitis 04/17/2016   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   Migraine without aura and without status migrainosus, not intractable 10/10/2014   Migraine with aura and without status migrainosus, not intractable 08/29/2014    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 classified elsewhere 08/29/2014   Hypoventilation associated with obesity syndrome ([place]) 07/10/2014   Snorings 07/10/2014   Sleep related headaches 07/10/2014   Nocturia more than twice per night 07/10/2014   Preventative health care 07/30/2012   Class 3 severe obesity due to excess calories with serious comorbidity and body mass index (BMI) greater than or equal to 70 in adult ([place]) 07/30/2012    Orientation RESPIRATION BLADDER Height & Weight     Self, Time, Situation, Place  O2 (2L/min) Incontinent Weight: (!) 485 lb (220 kg) Height:  5' (152.4 cm)  BEHAVIORAL SYMPTOMS/MOOD NEUROLOGICAL BOWEL NUTRITION STATUS   (N/A)  (N/A) Continent Diet (Heart healthy/carb modified)  AMBULATORY STATUS COMMUNICATION OF NEEDS Skin   Extensive Assist Verbally Other (Comment) (Rash: bilateral arms, chest, abdomen; Erythema: right leg)                       Personal Care Assistance Level of Assistance  Bathing, Feeding, Dressing Bathing Assistance: Maximum assistance Feeding assistance: Independent Dressing Assistance: Maximum assistance     Functional Limitations Info  Sight, Hearing, Speech Sight Info: Impaired Hearing Info: Adequate Speech Info: Adequate    SPECIAL CARE FACTORS FREQUENCY  PT (By licensed PT), OT (By licensed OT)     PT Frequency: 5x's/week OT Frequency: 5x's/week            Contractures Contractures Info: Not present    Additional Factors Info  Code Status, Allergies, Insulin Sliding Scale Code Status Info: Full Allergies Info: Gabapentin, Topiramate, Advair Diskus (Fluticasone-salmeterol), Imitrex (Sumatriptan), Latex, Montelukast, Other, Prozac (Fluoxetine), Trazodone And Nefazodone, Copper-containing Compounds   Insulin Sliding  Scale Info: See discharge summary       Current Medications (02/18/2022):  This is the current hospital active medication list Current Facility-Administered Medications  Medication Dose Route  Frequency Provider Last Rate Last Admin   0.9 %  sodium chloride infusion  250 mL Intravenous PRN Norins, Heinz Knuckles, MD 10 mL/hr at 02/16/22 1530 10 mL at 02/16/22 1530   acetaminophen (TYLENOL) tablet 1,000 mg  1,000 mg Oral Q6H PRN Wendee Beavers T, MD   1,000 mg at 02/18/22 [id number]   apixaban (ELIQUIS) tablet 5 mg  5 mg Oral BID Norins, Heinz Knuckles, MD   5 mg at 02/18/22 [id number]   bumetanide (BUMEX) tablet 2 mg  2 mg Oral Daily Norins, Heinz Knuckles, MD   2 mg at 02/18/22 [id number]   ceFAZolin (ANCEF) IVPB 2g/100 mL premix  2 g Intravenous Q8H Norins, Heinz Knuckles, MD 200 mL/hr at 02/18/22 0514 2 g at 02/18/22 0514   dofetilide (TIKOSYN) capsule 500 mcg  500 mcg Oral BID Neena Rhymes, MD   500 mcg at 02/18/22 0833   fluticasone furoate-vilanterol (BREO ELLIPTA) 200-25 MCG/ACT 1 puff  1 puff Inhalation Daily Norins, Heinz Knuckles, MD   1 puff at 02/18/22 0816   hydrOXYzine (ATARAX) tablet 50 mg  50 mg Oral TID PRN Norins, Heinz Knuckles, MD       insulin aspart (novoLOG) injection 0-15 Units  0-15 Units Subcutaneous TID WC Gonfa, Taye T, MD       insulin aspart (novoLOG) injection 0-5 Units  0-5 Units Subcutaneous QHS Gonfa, Taye T, MD       iron polysaccharides (NIFEREX) capsule 150 mg  150 mg Oral Daily Norins, Heinz Knuckles, MD       ketorolac (TORADOL) 15 MG/ML injection 15 mg  15 mg Intravenous Q6H PRN Gonfa, Taye T, MD       melatonin tablet 10 mg  10 mg Oral QHS Norins, Heinz Knuckles, MD   10 mg at 02/17/22 2125   nystatin (MYCOSTATIN/NYSTOP) topical powder   Topical BID Neena Rhymes, MD   Given at 02/18/22 [phone]   oxyCODONE (Oxy IR/ROXICODONE) immediate release tablet 5 mg  5 mg Oral Q6H PRN Wendee Beavers T, MD   5 mg at 02/18/22 1124   pantoprazole (PROTONIX) EC tablet 40 mg  40 mg Oral Daily Wendee Beavers T, MD   40 mg at 02/18/22 0928   polyethylene glycol (MIRALAX / GLYCOLAX) packet 17 g  17 g Oral Daily Norins, Heinz Knuckles, MD       pregabalin (LYRICA) capsule 100 mg  100 mg Oral BID Wendee Beavers T, MD   100 mg at  02/18/22 0928   propranolol (INDERAL) tablet 80 mg  80 mg Oral TID Neena Rhymes, MD   80 mg at 02/18/22 0929   simethicone (MYLICON) chewable tablet 80 mg  80 mg Oral Q6H PRN Neena Rhymes, MD   80 mg at 02/17/22 2125   sodium chloride flush (NS) 0.9 % injection 3 mL  3 mL Intravenous Q12H Norins, Heinz Knuckles, MD       sodium chloride flush (NS) 0.9 % injection 3 mL  3 mL Intravenous PRN Norins, Heinz Knuckles, MD       spironolactone (ALDACTONE) tablet 25 mg  25 mg Oral Daily Norins, Heinz Knuckles, MD   25 mg at 02/18/22 0928   zolpidem (AMBIEN) tablet 5 mg  5 mg Oral QHS PRN Norins, Heinz Knuckles, MD  Discharge Medications: Please see discharge summary for a list of discharge medications.  Relevant Imaging Results:  Relevant Lab Results:   Additional Information SSN: [id number]  Ewing Schlein, LCSW

## 2022-02-18 NOTE — TOC Initial Note (Signed)
Transition of Care [facility]) - Initial/Assessment Note   Patient Details  Name: Alyssa Blanchard MRN: [id number] Date of Birth: 02-14-1971  [facility] CM/SW Contact:    Sherie Don, LCSW Phone Number: 02/18/2022, 1:37 PM  Clinical Narrative: PT/OT evaluations recommended SNF. CSW met with patient to discuss recommendations. Patient is agreeable to being faxed out for SNF, but only has Medicaid which she received on February 07, 2022. Patient reported she does not have a monthly check to sign over to a facility. Patient also reported she was recently at [facility] SNF for 4 weeks, which was paid for by [facility], and was home for two weeks prior to returning to the hospital.  FL2 done; PASRR confirmed. Initial referral faxed out to SNFs. TOC supervisor aware this may be a DTP case in part due to patient's insurance and lack of a monthly check. TOC awaiting bed offers.  Expected Discharge Plan: Skilled Nursing Facility Barriers to Discharge: SNF Pending bed offer, Continued Medical Work up  Patient Goals and CMS Choice Patient states their goals for this hospitalization and ongoing recovery are:: Go to rehab CMS Medicare.gov Compare Post Acute Care list provided to:: Patient Choice offered to / list presented to : Patient  Expected Discharge Plan and Services Expected Discharge Plan: [place] In-house Referral: Clinical Social Work Post Acute Care Choice: [place] Living arrangements for the past 2 months: [place]            DME Arranged: N/A DME Agency: NA  Prior Living Arrangements/Services Living arrangements for the past 2 months: Single Family Home Patient language and need for interpreter reviewed:: Yes Need for Family Participation in Patient Care: No (Comment) Care giver support system in place?: Yes (comment) Criminal Activity/Legal Involvement Pertinent to Current Situation/Hospitalization: No - Comment as  needed  Activities of Daily Living Home Assistive Devices/Equipment: Eyeglasses ADL Screening (condition at time of admission) Patient's cognitive ability adequate to safely complete daily activities?: Yes Is the patient deaf or have difficulty hearing?: No Does the patient have difficulty seeing, even when wearing glasses/contacts?: No Does the patient have difficulty concentrating, remembering, or making decisions?: No Patient able to express need for assistance with ADLs?: Yes Does the patient have difficulty dressing or bathing?: Yes Independently performs ADLs?: No Communication: Independent Dressing (OT): Needs assistance Is this a change from baseline?: Pre-admission baseline Grooming: Needs assistance Is this a change from baseline?: Pre-admission baseline Feeding: Independent Bathing: Needs assistance Is this a change from baseline?: Pre-admission baseline Toileting: Dependent Is this a change from baseline?: Change from baseline, expected to last <3 days In/Out Bed: Dependent Is this a change from baseline?: Change from baseline, expected to last >3 days Walks in Home: Needs assistance Is this a change from baseline?: Change from baseline, expected to last >3 days Does the patient have difficulty walking or climbing stairs?: Yes Weakness of Legs: Both Weakness of Arms/Hands: None  Permission Sought/Granted Permission sought to share information with : Facility Art therapist granted to share information with : Yes, Verbal Permission Granted Permission granted to share info w AGENCY: SNFs  Emotional Assessment Appearance:: Appears stated age Attitude/Demeanor/Rapport: Engaged Affect (typically observed): Accepting Orientation: : Oriented to Self, Oriented to Place, Oriented to  Time, Oriented to Situation Alcohol / Substance Use: Not Applicable  Admission diagnosis:  Cellulitis of right leg [L03.115] Cellulitis of right lower extremity  [L03.115] Patient Active Problem List   Diagnosis Date Noted   Prolonged QT interval  02/17/2022   Cellulitis of right leg 02/16/2022   Lumbar radiculopathy, right 02/16/2022   Anemia 02/16/2022   Iron deficiency anemia 02/02/2022   Type 2 diabetes mellitus without complication, without long-term current use of insulin ([place]) 12/25/2021   Bipolar 1 disorder ([place]) 01/30/2020   Chronic combined systolic and diastolic congestive heart failure ([place]) 09/25/2019   Acute pulmonary edema (HCC)    Paroxysmal atrial fibrillation ([place]) 08/02/2019   Bipolar I disorder, most recent episode (or current) manic ([place]) 04/15/2019   Psychosis ([place]) 04/14/2019   Not well controlled moderate persistent asthma 02/02/2018   Chronic rhinitis 02/02/2018   Status asthmaticus 11/29/2017   Acute respiratory failure with hypoxia ([place]) 11/29/2017   Gastroesophageal reflux disease 09/01/2017   Cough 12/31/2016   Dysuria 07/28/2016   Clinical infection 07/28/2016   History of asthma 04/17/2016   Acute bronchitis 04/17/2016   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   Migraine without aura and without status migrainosus, not intractable 10/10/2014   Migraine with aura and without status migrainosus, not intractable 08/29/2014   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 classified elsewhere 08/29/2014   Hypoventilation associated with obesity syndrome ([place]) 07/10/2014   Snorings 07/10/2014   Sleep related headaches 07/10/2014   Nocturia more than twice per night 07/10/2014   Preventative health care 07/30/2012   Class 3 severe obesity due to excess calories with serious comorbidity and body mass index (BMI) greater than or equal to 70 in adult ([place]) 07/30/2012   PCP:  Patrecia Pour, Christean Grief, MD Pharmacy:   [facility] [address] [place] [address] Phone: [phone] Fax: [phone]  [id number]: [phone]: [phone] Social Determinants of Health (SDOH) Interventions Food Insecurity Interventions: Intervention Not Indicated Housing Interventions: Intervention Not Indicated Transportation Interventions: Intervention Not Indicated Utilities Interventions: Intervention Not Indicated  Readmission Risk Interventions     No data to display

## 2022-02-18 NOTE — Progress Notes (Signed)
PROGRESS NOTE  Alyssa Blanchard ZOX:[id number] DOB: Mar 18, 1970   PCP: Randel Pigg, Dorma Russell, MD  Patient is from: Lives with mother.  Reports using rolling walker at baseline  DOA: 02/16/2022 LOS: 2  Chief complaints Chief Complaint  Patient presents with   Leg Pain     Brief Narrative / Interim history: 51 year old F with PMH of combined CHF, morbid obesity, A-fib on Eliquis, possible OHS, prolonged QT, lumbar radiculopathy, BPD, fibromyalgia and iron deficiency anemia presenting with bilateral leg swelling,, weight gain, right thigh pain and recurrent falls at home, and admitted for right lower extremity cellulitis, and acute on chronic right lumbar radiculopathy.  She was started on IV Ancef.  MRI lumbar spine ordered but patient was not able to feel MRI due to morbid obesity.  Pain and cellulitis seems to have improved.  Therapy recommended SNF.  She is medically optimized for discharge pending SNF.  Subjective: Seen and examined earlier this morning.  Back pain improved after med change last night.  She rates her pain 6/10 at the moment.  No other complaints.  Denies chest pain, shortness of breath, GI or UTI symptoms.  Objective: Vitals:   02/17/22 2142 02/18/22 0518 02/18/22 0817 02/18/22 1122  BP:  120/65  (!) 109/53  Pulse: 77 73  72  Resp: 18 17  18   Temp:  97.7 F (36.5 C)  97.7 F (36.5 C)  TempSrc:  Oral  Oral  SpO2: 98% 98% 93% 100%  Weight:      Height:        Examination:  GENERAL: No apparent distress.  Nontoxic. HEENT: MMM.  Vision and hearing grossly intact.  NECK: Supple.  Difficult to assess JVD. RESP:  No IWOB.  Fair aeration bilaterally but limited exam. CVS:  RRR. Heart sounds normal.  ABD/GI/GU: BS+. Abd soft, NTND but limited exam.  MSK/EXT:  Moves extremities. No apparent deformity.  Difficult to assess edema. SKIN: Erythema and skin flexures likely intertrigo NEURO: Awake and alert. Oriented appropriately.  No apparent focal neuro  deficit. PSYCH: Calm. Normal affect.   Procedures:  None  Microbiology summarized: None  Assessment and plan: Principal Problem:   Cellulitis of right leg Active Problems:   Class 3 severe obesity due to excess calories with serious comorbidity and body mass index (BMI) greater than or equal to 70 in adult [facility])   Paroxysmal atrial fibrillation (HCC)   Bipolar 1 disorder (HCC)   Chronic combined systolic and diastolic congestive heart failure (HCC)   Type 2 diabetes mellitus without complication, without long-term current use of insulin (HCC)   Lumbar radiculopathy, right   Anemia   Gastroesophageal reflux disease   Hypoventilation associated with obesity syndrome (HCC)   Iron deficiency anemia   Prolonged QT interval  Possible right leg cellulitis: It is unclear if the erythema is really cellulitis or intertrigo since it is mainly in skin flexures.   -Antibiotics for 5 days total.  Currently on IV Ancef. -Nystatin powder for possible intertrigo. -Leg elevation as able -Continue home diuretics.  Lumbar radiculopathy, right: Chronic issue.  She is already on Lyrica.  Not able to get lumbar MRI due to body habitus. -Increase Lyrica to 100 mg twice daily -Discontinue Toradol.  P.o. oxycodone 5 mg every 6 hours as needed -Change Toradol to as needed -PT/OT-recommended SNF.   Iron deficiency anemia: H&H seems to be better than baseline.  Anemia panel with significant iron deficiency. Recent Labs    01/28/22 1623 01/29/22 0026 01/31/22 1250 02/16/22  9628 02/16/22 0554 02/17/22 1042 02/18/22 0423  HGB 9.9* 8.1* 8.3* 8.7* 10.5* 7.9* 8.1*  -IV ferric gluconate 250 mg daily x 2 -P.o. Protonix -Monitor H&H   Controlled NIDDM-2: A1c 6.1% Recent Labs  Lab 02/17/22 1708 02/17/22 2106 02/18/22 0752 02/18/22 1119  GLUCAP 104* 131* 108* 115*  -Continue SSI-moderate -Okay to continue home Ozempic if she can bring from home -Hold home glipizide.  Glipizide may cause weight  gain. -SSI-moderate  Chronic combined CHF: TTE in 2021 with LVEF of 30 to 35%, GH and normal RV function.Marland Kitchen  Presents with BLE edema and weight gain but not able to elaborate how much she gained.  Difficult to assess fluid status.  No significant cardiopulmonary symptoms. -Continue home Bumex and Aldactone. -Continue holding losartan.  Paroxysmal A-fib/prolonged QT.  Rate controlled.  Not in RVR.  QTc 498.  She is on Tikosyn. -Continue telemetry monitoring -Continue Tikosyn, Inderal and Eliquis -Agree with daily potassium supplementation while on Tikosyn -Optimize electrolytes.  Bipolar 1 disorder: stopped all psychiatric meds 2/2 side effects. Is not currently seeing psychiatrist or therapist. Discussed with her the importance of resuming psychopharmacologic treatment and cognitive treatment.  No imminent danger. -Will not reorder psych meds at this time -TOC and PCP to establish with psychiatrist.   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 -PT/OT-recommended SNF.  Obesity hypoventilation syndrome/possible OSA-did not tolerate CPAP at night. -Needs sleep study.  Vitamin D deficiency -Check vitamin D level.   Morbid obesity: Markedly elevated BMI and ambulatory dysfunction Body mass index is 94.72 kg/m. -Continue Ozempic.  Patient to provide supply from home -Address bipolar disorder          DVT prophylaxis:   apixaban (ELIQUIS) tablet 5 mg  Code Status: Full code Family Communication: None at bedside Level of care: Telemetry Status is: Inpatient Remains inpatient appropriate because: SNF bed   Final disposition: SNF Consultants:  None  Sch Meds:  Scheduled Meds:  apixaban  5 mg Oral BID   bumetanide  2 mg Oral Daily   dofetilide  500 mcg Oral BID   fluticasone furoate-vilanterol  1  puff Inhalation Daily   insulin aspart  0-15 Units Subcutaneous TID WC   insulin aspart  0-5 Units Subcutaneous QHS   iron polysaccharides  150 mg Oral Daily   melatonin  10 mg Oral QHS   nystatin   Topical BID   pantoprazole  40 mg Oral Daily   polyethylene glycol  17 g Oral Daily   potassium chloride  40 mEq Oral Daily   pregabalin  100 mg Oral BID   propranolol  80 mg Oral TID   sodium chloride flush  3 mL Intravenous Q12H   spironolactone  25 mg Oral Daily   Continuous Infusions:  sodium chloride 10 mL (02/16/22 1530)    ceFAZolin (ANCEF) IV 2 g (02/18/22 1428)   PRN Meds:.sodium chloride, acetaminophen, hydrOXYzine, ketorolac, oxyCODONE, simethicone, sodium chloride flush, zolpidem  Antimicrobials: Anti-infectives (From admission, onward)    Start     Dose/Rate Route Frequency Ordered Stop   02/16/22 1400  ceFAZolin (ANCEF) IVPB 2g/100 mL premix        2 g 200 mL/hr over 30 Minutes Intravenous Every 8 hours 02/16/22 0938 02/23/22 1359   02/16/22 0930  ceFAZolin (ANCEF) IVPB 2g/100 mL premix  Status:  Discontinued        2 g  200 mL/hr over 30 Minutes Intravenous Every 8 hours 02/16/22 0929 02/16/22 0938   02/16/22 0715  ceFAZolin (ANCEF) IVPB 2g/100 mL premix        2 g 200 mL/hr over 30 Minutes Intravenous  Once 02/16/22 0709 02/16/22 0830        I have personally reviewed the following labs and images: CBC: Recent Labs  Lab 02/16/22 0539 02/16/22 0554 02/17/22 1042 02/18/22 0423  WBC 13.0*  --  8.4 8.9  NEUTROABS 9.6*  --   --   --   HGB 8.7* 10.5* 7.9* 8.1*  HCT 30.7* 31.0* 28.2* 29.4*  MCV 88.5  --  91.0 91.9  PLT 275  --  191 170   BMP &GFR Recent Labs  Lab 02/16/22 0539 02/16/22 0554 02/17/22 1042 02/18/22 0423 02/18/22 0442  NA 134* 134* 138  --  138  K 4.1 4.2 3.6  --  3.9  CL 98 94* 101  --  100  CO2 26  --  29  --  31  GLUCOSE 138* 140* 145*  --  127*  BUN 11 8 10   --  7  CREATININE 0.94 0.80 1.01*  --  0.78  CALCIUM 8.7*  --  8.3*  --   8.5*  MG 2.1  --  2.0 1.9  --   PHOS  --   --  4.4  --   --    Estimated Creatinine Clearance: 151.4 mL/min (by C-G formula based on SCr of 0.78 mg/dL). Liver & Pancreas: Recent Labs  Lab 02/16/22 0539 02/17/22 1042  AST 31  --   ALT 19  --   ALKPHOS 74  --   BILITOT 0.9  --   PROT 7.6  --   ALBUMIN 3.4* 3.1*   No results for input(s): "LIPASE", "AMYLASE" in the last 168 hours. No results for input(s): "AMMONIA" in the last 168 hours. Diabetic: Recent Labs    02/16/22 1414  HGBA1C 6.1*   Recent Labs  Lab 02/17/22 1708 02/17/22 2106 02/18/22 0752 02/18/22 1119  GLUCAP 104* 131* 108* 115*   Cardiac Enzymes: Recent Labs  Lab 02/18/22 0423  CKTOTAL 104   No results for input(s): "PROBNP" in the last 8760 hours. Coagulation Profile: No results for input(s): "INR", "PROTIME" in the last 168 hours. Thyroid Function Tests: Recent Labs    02/18/22 0438  TSH 2.307   Lipid Profile: Recent Labs    02/18/22 0423  CHOL 134  HDL 26*  LDLCALC 86  TRIG 111  CHOLHDL 5.2   Anemia Panel: Recent Labs    02/16/22 1414  VITAMINB12 332  FOLATE 18.3  FERRITIN 22  TIBC 473*  IRON 32  RETICCTPCT 4.0*   Urine analysis:    Component Value Date/Time   COLORURINE YELLOW 02/16/2022 0757   APPEARANCEUR CLEAR 02/16/2022 0757   LABSPEC <1.005 (L) 02/16/2022 0757   PHURINE 6.0 02/16/2022 0757   GLUCOSEU NEGATIVE 02/16/2022 0757   HGBUR NEGATIVE 02/16/2022 0757   BILIRUBINUR NEGATIVE 02/16/2022 0757   BILIRUBINUR negative 07/28/2016 1614   BILIRUBINUR neg 08/29/2014 1528   KETONESUR NEGATIVE 02/16/2022 0757   PROTEINUR NEGATIVE 02/16/2022 0757   UROBILINOGEN 0.2 07/28/2016 1614   NITRITE NEGATIVE 02/16/2022 0757   LEUKOCYTESUR NEGATIVE 02/16/2022 0757   Sepsis Labs: Invalid input(s): "PROCALCITONIN", "LACTICIDVEN"  Microbiology: No results found for this or any previous visit (from the past 240 hour(s)).  Radiology Studies: No results found.    Alyssa Blanchard T.  Elrod  If  7PM-7AM, please contact night-coverage [email or web address] 02/18/2022, 2:28 PM

## 2022-02-19 DIAGNOSIS — L03115 Cellulitis of right lower limb: Secondary | ICD-10-CM | POA: Diagnosis not present

## 2022-02-19 DIAGNOSIS — F319 Bipolar disorder, unspecified: Secondary | ICD-10-CM | POA: Diagnosis not present

## 2022-02-19 DIAGNOSIS — I5042 Chronic combined systolic (congestive) and diastolic (congestive) heart failure: Secondary | ICD-10-CM | POA: Diagnosis not present

## 2022-02-19 LAB — RENAL FUNCTION PANEL
Albumin: 3 g/dL — ABNORMAL LOW (ref 3.5–5.0)
Anion gap: 11 (ref 5–15)
BUN: 6 mg/dL (ref 6–20)
CO2: 29 mmol/L (ref 22–32)
Calcium: 8.6 mg/dL — ABNORMAL LOW (ref 8.9–10.3)
Chloride: 98 mmol/L (ref 98–111)
Creatinine, Ser: 0.83 mg/dL (ref 0.44–1.00)
GFR, Estimated: >60 mL/min (ref >60)
Glucose, Bld: 117 mg/dL — ABNORMAL HIGH (ref 70–99)
Phosphorus: 4.2 mg/dL (ref 2.5–4.6)
Potassium: 3.7 mmol/L (ref 3.5–5.1)
Sodium: 138 mmol/L (ref 135–145)

## 2022-02-19 LAB — MAGNESIUM: Magnesium: 1.8 mg/dL (ref 1.7–2.4)

## 2022-02-19 LAB — CBC
HCT: 28.9 % — ABNORMAL LOW (ref 36.0–46.0)
Hemoglobin: 8.1 g/dL — ABNORMAL LOW (ref 12.0–15.0)
MCH: 25.3 pg — ABNORMAL LOW (ref 26.0–34.0)
MCHC: 28 g/dL — ABNORMAL LOW (ref 30.0–36.0)
MCV: 90.3 fL (ref 80.0–100.0)
Platelets: 175 10*3/uL (ref 150–400)
RBC: 3.2 MIL/uL — ABNORMAL LOW (ref 3.87–5.11)
RDW: 18.7 % — ABNORMAL HIGH (ref 11.5–15.5)
WBC: 9 10*3/uL (ref 4.0–10.5)
nRBC: 0 % (ref 0.0–0.2)

## 2022-02-19 LAB — GLUCOSE, CAPILLARY
Glucose-Capillary: 107 mg/dL — ABNORMAL HIGH (ref 70–99)
Glucose-Capillary: 127 mg/dL — ABNORMAL HIGH (ref 70–99)
Glucose-Capillary: 108 mg/dL — ABNORMAL HIGH (ref 70–99)
Glucose-Capillary: 162 mg/dL — ABNORMAL HIGH (ref 70–99)

## 2022-02-19 MED ORDER — MAGNESIUM SULFATE 2 GM/50ML IV SOLN
2.0000 g | Freq: Once | INTRAVENOUS | Status: AC
  Administered 2022-02-19: 2 g via INTRAVENOUS
  Filled 2022-02-19: qty 50

## 2022-02-19 MED ORDER — LOSARTAN POTASSIUM 50 MG PO TABS
50.0000 mg | ORAL_TABLET | Freq: Every day | ORAL | Status: DC
  Administered 2022-02-19 – 2022-02-26 (×8): 50 mg via ORAL
  Filled 2022-02-19 (×8): qty 1

## 2022-02-19 MED ORDER — SILVER SULFADIAZINE 1 % EX CREA
1.0000 | TOPICAL_CREAM | Freq: Every day | CUTANEOUS | Status: DC
  Administered 2022-02-19 – 2022-02-28 (×10): 1 via TOPICAL
  Filled 2022-02-19: qty 50

## 2022-02-19 MED ORDER — POTASSIUM CHLORIDE CRYS ER 20 MEQ PO TBCR
40.0000 meq | EXTENDED_RELEASE_TABLET | Freq: Once | ORAL | Status: AC
  Administered 2022-02-19: 40 meq via ORAL
  Filled 2022-02-19: qty 2

## 2022-02-19 MED ORDER — TRIAMCINOLONE ACETONIDE 0.1 % EX CREA
1.0000 | TOPICAL_CREAM | Freq: Two times a day (BID) | CUTANEOUS | Status: DC
  Administered 2022-02-19 – 2022-02-28 (×12): 1 via TOPICAL
  Filled 2022-02-19: qty 15

## 2022-02-19 NOTE — Progress Notes (Signed)
PT refused CPAP.  

## 2022-02-19 NOTE — TOC Progression Note (Addendum)
Transition of Care [facility]) - Progression Note   Patient Details  Name: Alyssa Blanchard MRN: [id number] Date of Birth: Jan 14, 1971  Transition of [facility]) CM/SW Contact  Ewing Schlein, LCSW Phone Number: 02/19/2022, 1:30 PM  Clinical Narrative: TOC has received 30+ declines for SNF placement due to insurance and lack of bariatric bed availability. CSW followed up with Wille Celeste at [facility] to see if facility would agree to accept patient again for rehab, but was informed there is no bariatric bed available. TOC supervisor aware and [facility] has been contacted to assist with finding any open bariatric beds. TOC continuing bed search.  Addendum: CSW updated by [facility] supervisor that [facility] does not have any bed availability to accommodate the patient and [facility] is also unable to make a bed offer.  CSW faxed clinicals to [facility] corporate [phone]), which has SNFs in the [place] area.  Expected Discharge Plan: Skilled Nursing Facility Barriers to Discharge: SNF Pending bed offer, Continued Medical Work up  Expected Discharge Plan and Services Expected Discharge Plan: Skilled Nursing Facility In-house Referral: Clinical Social Work Post Acute Care Choice: Skilled Nursing Facility Living arrangements for the past 2 months: Single Family Home Expected Discharge Date: 02/19/22               DME Arranged: N/A DME Agency: NA  Social Determinants of Health (SDOH) Interventions Food Insecurity Interventions: Intervention Not Indicated Housing Interventions: Intervention Not Indicated Transportation Interventions: Intervention Not Indicated Utilities Interventions: Intervention Not Indicated  Readmission Risk Interventions     No data to display

## 2022-02-19 NOTE — Progress Notes (Signed)
PROGRESS NOTE  Alyssa Blanchard LFY:[id number] DOB: 1971/02/26   PCP: Randel Pigg, Dorma Russell, MD  Patient is from: Lives with mother.  Reports using rolling walker at baseline  DOA: 02/16/2022 LOS: 3  Chief complaints Chief Complaint  Patient presents with   Leg Pain     Brief Narrative / Interim history: 51 year old F with PMH of combined CHF, morbid obesity, A-fib on Eliquis, possible OHS, prolonged QT, lumbar radiculopathy, BPD, fibromyalgia and iron deficiency anemia presenting with bilateral leg swelling,, weight gain, right thigh pain and recurrent falls at home, and admitted for right lower extremity cellulitis, and acute on chronic right lumbar radiculopathy.  She was started on IV Ancef.  MRI lumbar spine ordered but patient was not able to feel MRI due to morbid obesity.  Pain and cellulitis seems to have improved.  Therapy recommended SNF.  She is medically optimized for discharge pending SNF difficult to place she requires bariatric bed.  Subjective: Seen and examined earlier this morning.  No major events overnight of this morning.  No complaints other than some discomfort in her legs.  She thinks the air mattress has deflated causing her pain.  No other complaints. Objective: Vitals:   02/18/22 2313 02/18/22 2325 02/19/22 0551 02/19/22 0750  BP: (!) 112/50  123/66   Pulse: 75 76 73   Resp: 18 20 18    Temp: 98.6 F (37 C)  97.8 F (36.6 C)   TempSrc: Oral  Oral   SpO2: 99% 98% 97% 93%  Weight:      Height:        Examination:  GENERAL: No apparent distress.  Nontoxic. HEENT: MMM.  Vision and hearing grossly intact.  NECK: Supple.  Difficult to assess JVD due to body habitus. RESP:  No IWOB.  Fair aeration bilaterally limited exam due to body habitus. CVS:  RRR. Heart sounds normal.  ABD/GI/GU: BS+. Abd soft, NTND limited exam due to body habitus.  MSK/EXT:  Moves extremities. No apparent deformity.  Difficult to assess edema but seems to have improved SKIN:  Some erythema and skin flexures of lower extremity NEURO: Awake and alert. Oriented appropriately.  No apparent focal neuro deficit. PSYCH: Calm. Normal affect.   Procedures:  None  Microbiology summarized: None  Assessment and plan: Principal Problem:   Cellulitis of right leg Active Problems:   Class 3 severe obesity due to excess calories with serious comorbidity and body mass index (BMI) greater than or equal to 70 in adult [facility])   Paroxysmal atrial fibrillation (HCC)   Bipolar 1 disorder (HCC)   Chronic combined systolic and diastolic congestive heart failure (HCC)   Type 2 diabetes mellitus without complication, without long-term current use of insulin (HCC)   Lumbar radiculopathy, right   Anemia   Gastroesophageal reflux disease   Hypoventilation associated with obesity syndrome (HCC)   Iron deficiency anemia   Prolonged QT interval  Possible right leg cellulitis: It is unclear if the erythema is really cellulitis or intertrigo since it is mainly in skin flexures.   -Antibiotics for 5 days total.  Currently on IV Ancef. -Nystatin powder for possible intertrigo. -Leg elevation as able -Continue home diuretics.  Lumbar radiculopathy, right: Chronic issue.  She is already on Lyrica.  Not able to get lumbar MRI due to body habitus. -Increase Lyrica to 100 mg twice daily -Discontinue Toradol.  P.o. oxycodone 5 mg every 6 hours as needed -Change Toradol to as needed -PT/OT-recommended SNF.   Iron deficiency anemia: H&H seems to  be better than baseline.  Anemia panel with significant iron deficiency. Recent Labs    01/28/22 1623 01/29/22 0026 01/31/22 1250 02/16/22 0539 02/16/22 0554 02/17/22 1042 02/18/22 0423 02/19/22 0503  HGB 9.9* 8.1* 8.3* 8.7* 10.5* 7.9* 8.1* 8.1*  -IV ferric gluconate 250 mg daily x 2 -P.o. Protonix -Monitor H&H   Controlled NIDDM-2: A1c 6.1% Recent Labs  Lab 02/18/22 0752 02/18/22 1119 02/18/22 1645 02/18/22 2157 02/19/22 0715   GLUCAP 108* 115* 139* 107* 107*  -Continue SSI-moderate -Okay to continue home Ozempic if she can bring from home -Hold home glipizide.  Glipizide may cause weight gain. -SSI-moderate  Chronic combined CHF: TTE in 2021 with LVEF of 30 to 35%, GH and normal RV function.Marland Kitchen  Presents with BLE edema and weight gain but not able to elaborate how much she gained.  Difficult to assess fluid status.  No significant cardiopulmonary symptoms.  About a liter UOP.  Net -10.2 L.  Blood pressure and creatinine stable. -Continue home Bumex and Aldactone. -Restart losartan at 50 mg daily -Continue holding losartan.  Paroxysmal A-fib/prolonged QT.  Rate controlled.  Not in RVR.  QTc 498.  She is on Tikosyn. -Continue telemetry monitoring -Continue Tikosyn, Inderal and Eliquis -Continue daily potassium supplementation -Optimize electrolytes.  Bipolar 1 disorder: stopped all psychiatric meds 2/2 side effects. Is not currently seeing psychiatrist or therapist. Discussed with her the importance of resuming psychopharmacologic treatment and cognitive treatment.  No imminent danger. -Will not reorder psych meds at this time -TOC and PCP to establish with psychiatrist.   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 -PT/OT-recommended SNF.  Obesity hypoventilation syndrome/possible OSA-did not tolerate CPAP at night. -Needs sleep study.  Vitamin D deficiency: Ruled out.  Vitamin D level 36.   Morbid obesity: Markedly elevated BMI and ambulatory dysfunction Body mass index is 94.72 kg/m. -Continue Ozempic.  Patient to provide supply from home -Address bipolar disorder          DVT prophylaxis:   apixaban (ELIQUIS) tablet 5 mg  Code Status: Full code Family Communication: None at bedside Level of care: Telemetry Status is:  Inpatient Remains inpatient appropriate because: SNF but difficult to place.   Final disposition: SNF Consultants:  None  Sch Meds:  Scheduled Meds:  apixaban  5 mg Oral BID   bariatric multiple vitamin  2 tablet Oral Daily   bumetanide  2 mg Oral Daily   dofetilide  500 mcg Oral BID   fluticasone furoate-vilanterol  1 puff Inhalation Daily   insulin aspart  0-15 Units Subcutaneous TID WC   insulin aspart  0-5 Units Subcutaneous QHS   iron polysaccharides  150 mg Oral Daily   losartan  50 mg Oral Daily   melatonin  10 mg Oral QHS   nystatin   Topical BID   pantoprazole  40 mg Oral Daily   polyethylene glycol  17 g Oral Daily   potassium chloride  40 mEq Oral Daily   pregabalin  100 mg Oral BID   propranolol  80 mg Oral TID   sodium chloride flush  3 mL Intravenous Q12H   spironolactone  25 mg Oral Daily   Continuous Infusions:  sodium chloride 10 mL (02/16/22 1530)    ceFAZolin (ANCEF) IV 2 g (02/19/22 0620)   PRN Meds:.sodium chloride, acetaminophen, hydrOXYzine, ketorolac, oxyCODONE, simethicone, sodium chloride flush, zolpidem  Antimicrobials: Anti-infectives (From admission, onward)  Start     Dose/Rate Route Frequency Ordered Stop   02/16/22 1400  ceFAZolin (ANCEF) IVPB 2g/100 mL premix        2 g 200 mL/hr over 30 Minutes Intravenous Every 8 hours 02/16/22 0938 02/23/22 1359   02/16/22 0930  ceFAZolin (ANCEF) IVPB 2g/100 mL premix  Status:  Discontinued        2 g 200 mL/hr over 30 Minutes Intravenous Every 8 hours 02/16/22 0929 02/16/22 0938   02/16/22 0715  ceFAZolin (ANCEF) IVPB 2g/100 mL premix        2 g 200 mL/hr over 30 Minutes Intravenous  Once 02/16/22 0709 02/16/22 0830        I have personally reviewed the following labs and images: CBC: Recent Labs  Lab 02/16/22 0539 02/16/22 0554 02/17/22 1042 02/18/22 0423 02/19/22 0503  WBC 13.0*  --  8.4 8.9 9.0  NEUTROABS 9.6*  --   --   --   --   HGB 8.7* 10.5* 7.9* 8.1* 8.1*  HCT 30.7* 31.0*  28.2* 29.4* 28.9*  MCV 88.5  --  91.0 91.9 90.3  PLT 275  --  191 170 175   BMP &GFR Recent Labs  Lab 02/16/22 0539 02/16/22 0554 02/17/22 1042 02/18/22 0423 02/18/22 0442 02/19/22 0503  NA 134* 134* 138  --  138 138  K 4.1 4.2 3.6  --  3.9 3.7  CL 98 94* 101  --  100 98  CO2 26  --  29  --  31 29  GLUCOSE 138* 140* 145*  --  127* 117*  BUN 11 8 10   --  7 6  CREATININE 0.94 0.80 1.01*  --  0.78 0.83  CALCIUM 8.7*  --  8.3*  --  8.5* 8.6*  MG 2.1  --  2.0 1.9  --  1.8  PHOS  --   --  4.4  --   --  4.2   Estimated Creatinine Clearance: 146 mL/min (by C-G formula based on SCr of 0.83 mg/dL). Liver & Pancreas: Recent Labs  Lab 02/16/22 0539 02/17/22 1042 02/19/22 0503  AST 31  --   --   ALT 19  --   --   ALKPHOS 74  --   --   BILITOT 0.9  --   --   PROT 7.6  --   --   ALBUMIN 3.4* 3.1* 3.0*   No results for input(s): "LIPASE", "AMYLASE" in the last 168 hours. No results for input(s): "AMMONIA" in the last 168 hours. Diabetic: Recent Labs    02/16/22 1414  HGBA1C 6.1*   Recent Labs  Lab 02/18/22 0752 02/18/22 1119 02/18/22 1645 02/18/22 2157 02/19/22 0715  GLUCAP 108* 115* 139* 107* 107*   Cardiac Enzymes: Recent Labs  Lab 02/18/22 0423  CKTOTAL 104   No results for input(s): "PROBNP" in the last 8760 hours. Coagulation Profile: No results for input(s): "INR", "PROTIME" in the last 168 hours. Thyroid Function Tests: Recent Labs    02/18/22 0438  TSH 2.307   Lipid Profile: Recent Labs    02/18/22 0423  CHOL 134  HDL 26*  LDLCALC 86  TRIG 111  CHOLHDL 5.2   Anemia Panel: Recent Labs    02/16/22 1414  VITAMINB12 332  FOLATE 18.3  FERRITIN 22  TIBC 473*  IRON 32  RETICCTPCT 4.0*   Urine analysis:    Component Value Date/Time   COLORURINE YELLOW 02/16/2022 0757   APPEARANCEUR CLEAR 02/16/2022 0757   LABSPEC <1.005 (  L) 02/16/2022 0757   PHURINE 6.0 02/16/2022 0757   GLUCOSEU NEGATIVE 02/16/2022 0757   HGBUR NEGATIVE 02/16/2022  0757   BILIRUBINUR NEGATIVE 02/16/2022 0757   BILIRUBINUR negative 07/28/2016 1614   BILIRUBINUR neg 08/29/2014 1528   KETONESUR NEGATIVE 02/16/2022 0757   PROTEINUR NEGATIVE 02/16/2022 0757   UROBILINOGEN 0.2 07/28/2016 1614   NITRITE NEGATIVE 02/16/2022 0757   LEUKOCYTESUR NEGATIVE 02/16/2022 0757   Sepsis Labs: Invalid input(s): "PROCALCITONIN", "LACTICIDVEN"  Microbiology: No results found for this or any previous visit (from the past 240 hour(s)).  Radiology Studies: No results found.    Asier Desroches T. Camas  If 7PM-7AM, please contact night-coverage [email or web address] 02/19/2022, 11:07 AM

## 2022-02-20 ENCOUNTER — Ambulatory Visit: Payer: Medicaid Other

## 2022-02-20 ENCOUNTER — Ambulatory Visit: Payer: Self-pay | Admitting: Emergency Medicine

## 2022-02-20 LAB — RENAL FUNCTION PANEL
Albumin: 3.2 g/dL — ABNORMAL LOW (ref 3.5–5.0)
Anion gap: 9 (ref 5–15)
BUN: 7 mg/dL (ref 6–20)
CO2: 32 mmol/L (ref 22–32)
Calcium: 8.8 mg/dL — ABNORMAL LOW (ref 8.9–10.3)
Chloride: 96 mmol/L — ABNORMAL LOW (ref 98–111)
Creatinine, Ser: 0.82 mg/dL (ref 0.44–1.00)
GFR, Estimated: >60 mL/min (ref >60)
Glucose, Bld: 137 mg/dL — ABNORMAL HIGH (ref 70–99)
Phosphorus: 3.9 mg/dL (ref 2.5–4.6)
Potassium: 3.7 mmol/L (ref 3.5–5.1)
Sodium: 137 mmol/L (ref 135–145)

## 2022-02-20 LAB — GLUCOSE, CAPILLARY
Glucose-Capillary: 147 mg/dL — ABNORMAL HIGH (ref 70–99)
Glucose-Capillary: 123 mg/dL — ABNORMAL HIGH (ref 70–99)
Glucose-Capillary: 123 mg/dL — ABNORMAL HIGH (ref 70–99)
Glucose-Capillary: 113 mg/dL — ABNORMAL HIGH (ref 70–99)
Glucose-Capillary: 170 mg/dL — ABNORMAL HIGH (ref 70–99)

## 2022-02-20 LAB — HEMOGLOBIN AND HEMATOCRIT, BLOOD
HCT: 33.7 % — ABNORMAL LOW (ref 36.0–46.0)
Hemoglobin: 9.3 g/dL — ABNORMAL LOW (ref 12.0–15.0)

## 2022-02-20 LAB — MAGNESIUM: Magnesium: 2.1 mg/dL (ref 1.7–2.4)

## 2022-02-20 MED ORDER — POTASSIUM CHLORIDE CRYS ER 20 MEQ PO TBCR
40.0000 meq | EXTENDED_RELEASE_TABLET | Freq: Once | ORAL | Status: AC
  Administered 2022-02-20: 40 meq via ORAL
  Filled 2022-02-20: qty 2

## 2022-02-20 MED ORDER — FLUCONAZOLE 150 MG PO TABS
150.0000 mg | ORAL_TABLET | Freq: Once | ORAL | Status: AC
  Administered 2022-02-20: 150 mg via ORAL
  Filled 2022-02-20: qty 1

## 2022-02-20 NOTE — Progress Notes (Signed)
Physical Therapy Treatment Patient Details Name: Alyssa Blanchard MRN: [id number] DOB: April 21, 1970 Today's Date: 02/20/2022   History of Present Illness Patient is a 51 year old female who presented after fall at home with weight gain and leg pain. Patient unable to go to MRI dur to body habitus. X-ray revealed moderate DJD of lumbar. Patient was admitted with cellulitis of R leg, anemia, and right lumbar radiculopathy. [id number] disorder, HFrEF, a fib, obesity,    PT Comments    Patient motivated to participate in therapy today. Eager to attempt sitting up in bed and standing. EZ wider chair position feature utilized and box placed below foot of bed for pt's feet to be supported. PT able to attempt scooting forward with Bil UE use on bed rails to stabilize. Due to pt's height she was unable to safely scoot far enough forward. Pt demonstrated good strength for repositioning back in bed and side egress initiated with pt able to pivot bil LE's and body to sit EOB with min assist for bed pad and safety. Box placed at pt's feet for support and bari RW set up for standing. 4+ assist to stabilize box, have person present at bed controls if adjustments needed to be made for safety and 2+ for guarding with sit<>stand. PT completed 5x sit<>stand total with extended seated rest between reps. HR stable throughout and no buckling noted on Rt LE. 3x weight shift Rt/Lt with pt lifting feet to toe touch only as pre-gait activity. EOS repositioned in bed to void bladder with purewick. Continue to recommend follow up PT to address impairments. Will progress as able.    Recommendations for follow up therapy are one component of a multi-disciplinary discharge planning process, led by the attending physician.  Recommendations may be updated based on patient status, additional functional criteria and insurance authorization.  Follow Up Recommendations  Skilled nursing-short term rehab (<3 hours/day) Can patient  physically be transported by private vehicle: No   Assistance Recommended at Discharge Frequent or constant Supervision/Assistance  Patient can return home with the following Two people to help with walking and/or transfers;Two people to help with bathing/dressing/bathroom;Assistance with cooking/housework;Assist for transportation;Help with stairs or ramp for entrance   Equipment Recommendations  None recommended by PT    Recommendations for Other Services       Precautions / Restrictions Precautions Precautions: Fall Restrictions Weight Bearing Restrictions: No     Mobility  Bed Mobility Overal bed mobility: Needs Assistance Bed Mobility: Supine to Sit, Sit to Supine     Supine to sit: Min assist, HOB elevated Sit to supine: Min assist, HOB elevated   General bed mobility comments: HOB elevated with pt in chair position and seat/foot section    Transfers Overall transfer level: Needs assistance Equipment used: Rolling walker (2 wheels) Transfers: Sit to/from Stand Sit to Stand: Min assist, +2 safety/equipment, +2 physical assistance, Mod assist                Ambulation/Gait                   Stairs             Wheelchair Mobility    Modified Rankin (Stroke Patients Only)       Balance Overall balance assessment: Needs assistance Sitting-balance support: Feet supported Sitting balance-Leahy Scale: Good     Standing balance support: Reliant on assistive device for balance, During functional activity, Bilateral upper extremity supported Standing balance-Leahy Scale: Poor Standing balance comment: reliant  on external support of RW and bed                            Cognition Arousal/Alertness: Awake/alert Behavior During Therapy: WFL for tasks assessed/performed Overall Cognitive Status: Within Functional Limits for tasks assessed                                          Exercises      General  Comments        Pertinent Vitals/Pain Pain Assessment Pain Assessment: Faces Faces Pain Scale: No hurt Pain Intervention(s): Limited activity within patient's tolerance, Monitored during session, Repositioned    Home Living                          Prior Function            PT Goals (current goals can now be found in the care plan section) Acute Rehab PT Goals Patient Stated Goal: get walking and home, not go to [facility] PT Goal Formulation: With patient Time For Goal Achievement: 03/03/22 Potential to Achieve Goals: Good Progress towards PT goals: Progressing toward goals    Frequency    Min 3X/week      PT Plan Current plan remains appropriate    Co-evaluation              AM-PAC PT "6 Clicks" Mobility   Outcome Measure  Help needed turning from your back to your side while in a flat bed without using bedrails?: Total Help needed moving from lying on your back to sitting on the side of a flat bed without using bedrails?: A Little Help needed moving to and from a bed to a chair (including a wheelchair)?: Total Help needed standing up from a chair using your arms (e.g., wheelchair or bedside chair)?: A Little Help needed to walk in hospital room?: Total Help needed climbing 3-5 steps with a railing? : Total 6 Click Score: 10    End of Session Equipment Utilized During Treatment: Oxygen Activity Tolerance: Patient tolerated treatment well Patient left: in bed;with call bell/phone within reach (plan to move to chair position after voiding bladder) Nurse Communication: Mobility status PT Visit Diagnosis: Unsteadiness on feet (R26.81);Muscle weakness (generalized) (M62.81);Difficulty in walking, not elsewhere classified (R26.2);History of falling (Z91.81);Other (comment)     Time: 2585-2778 PT Time Calculation (min) (ACUTE ONLY): 50 min  Charges:  $Therapeutic Activity: 38-52 mins                     Wynn Maudlin, DPT Acute Rehabilitation  Services Office [phone]  02/20/22 1:23 PM

## 2022-02-20 NOTE — Progress Notes (Signed)
PROGRESS NOTE  Alyssa Blanchard [id number] DOB: [birth year]   PCP: Randel Pigg, Dorma Russell, MD  Patient is from: Lives with mother.  Reports using rolling walker at baseline  DOA: 02/16/2022 LOS: 4  Chief complaints Chief Complaint  Patient presents with   Leg Pain     Brief Narrative / Interim history: 51 year old F with PMH of combined CHF, morbid obesity, A-fib on Eliquis, possible OHS, prolonged QT, lumbar radiculopathy, BPD, fibromyalgia and iron deficiency anemia presenting with bilateral leg swelling,, weight gain, right thigh pain and recurrent falls at home, and admitted for right lower extremity cellulitis, and acute on chronic right lumbar radiculopathy.  She was started on IV Ancef.  MRI lumbar spine ordered but patient was not able to feel MRI due to morbid obesity.  Pain and cellulitis seems to have improved.  Therapy recommended SNF.  She is medically optimized for discharge pending SNF difficult to place she requires bariatric bed.  Subjective: Seen and examined earlier this morning.  No major events overnight of this morning.  She reports some erythema and itching under her breasts and requests Diflucan.  No other complaints.  Objective: Vitals:   02/19/22 2145 02/20/22 0616 02/20/22 0745 02/20/22 1400  BP: (!) 143/67 128/63  (!) 129/58  Pulse: 76 72  69  Resp: 18 18  18   Temp: 99.2 F (37.3 C) 97.6 F (36.4 C)  97.6 F (36.4 C)  TempSrc: Oral Oral  Oral  SpO2: 98% 100% 97% 100%  Weight:      Height:        Examination:  GENERAL: No apparent distress.  Nontoxic. HEENT: MMM.  Vision and hearing grossly intact.  NECK: Supple.  Difficult to assess JVD due to body habitus. RESP:  No IWOB.  Fair aeration bilaterally but limited exam. CVS:  RRR. Heart sounds normal.  ABD/GI/GU: BS+. Abd soft, NTND but limited exam.  MSK/EXT:  Moves extremities. No apparent deformity.  Difficult to assess edema but seems to have improved. SKIN: Intertrigo NEURO: Awake and  alert. Oriented appropriately.  No apparent focal neuro deficit. PSYCH: Calm. Normal affect.   Procedures:  None  Microbiology summarized: None  Assessment and plan: Principal Problem:   Cellulitis of right leg Active Problems:   Class 3 severe obesity due to excess calories with serious comorbidity and body mass index (BMI) greater than or equal to 70 in adult [facility])   Paroxysmal atrial fibrillation (HCC)   Bipolar 1 disorder (HCC)   Chronic combined systolic and diastolic congestive heart failure (HCC)   Type 2 diabetes mellitus without complication, without long-term current use of insulin (HCC)   Lumbar radiculopathy, right   Anemia   Gastroesophageal reflux disease   Hypoventilation associated with obesity syndrome (HCC)   Iron deficiency anemia   Prolonged QT interval  Possible right leg cellulitis: It is unclear if the erythema is really cellulitis or intertrigo since it is mainly in skin flexures.   -Antibiotics for 5 days total.  Currently on IV Ancef. -P.o. Diflucan 150 mg x 1.  Continue nystatin powder. -Leg elevation as able -Continue home diuretics.  Lumbar radiculopathy, right: Chronic issue.  She is already on Lyrica.  Not able to get lumbar MRI due to body habitus. -Continue Lyrica to 100 mg twice daily.  Takes once a day at home. -P.o. oxycodone 5 mg every 6 hours as needed -Change Toradol to as needed -PT/OT-recommended SNF.   Iron deficiency anemia: H&H seems to be better than baseline.  Anemia panel  with significant iron deficiency. Recent Labs    01/28/22 1623 01/29/22 0026 01/31/22 1250 02/16/22 0539 02/16/22 0554 02/17/22 1042 02/18/22 0423 02/19/22 0503 02/20/22 0832  HGB 9.9* 8.1* 8.3* 8.7* 10.5* 7.9* 8.1* 8.1* 9.3*  -Received IV ferric gluconate 250 mg daily x 2 -P.o. Protonix -Monitor H&H   Controlled NIDDM-2: A1c 6.1% Recent Labs  Lab 02/19/22 1148 02/19/22 1655 02/19/22 2139 02/20/22 0747 02/20/22 1200  GLUCAP 127* 108* 162*  147* 123*  123*  -Continue SSI-moderate -Okay to continue home Ozempic if she can bring from home -Hold home glipizide.  Glipizide may cause weight gain. -SSI-moderate  Chronic combined CHF: TTE in 2021 with LVEF of 30 to 35%, GH and normal RV function.Marland Kitchen  Presents with BLE edema and weight gain but not able to elaborate how much she gained.  Difficult to assess fluid status.  No significant cardiopulmonary symptoms.  About 8 L UOP/24 hours.  Net -16 L.  Blood pressure and creatinine stable. -Continue home Bumex and Aldactone. -Continue losartan 50 mg daily -Optimize electrolytes.  Patient and is on Tikosyn. -Strict intake and output and daily weight  Paroxysmal A-fib/prolonged QT.  Rate controlled.  Not in RVR.  QTc 498.  She is on Tikosyn. -Continue telemetry monitoring -Continue Tikosyn, Inderal and Eliquis -Continue daily potassium supplementation.  -Optimize electrolytes.  Bipolar 1 disorder: stopped all psychiatric meds 2/2 side effects. Is not currently seeing psychiatrist or therapist. Discussed with her the importance of resuming psychopharmacologic treatment and cognitive treatment.  No imminent danger. -Will not reorder psych meds at this time -TOC and PCP to establish with psychiatrist.   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 -PT/OT-recommended SNF.  Obesity hypoventilation syndrome/possible OSA-did not tolerate CPAP at night. -Needs sleep study.  Eczema -Continue home triamcinolone and Atarax.  Vitamin D deficiency: Ruled out.  Vitamin D level 36.   Morbid obesity: Markedly elevated BMI and ambulatory dysfunction Body mass index is 94.72 kg/m. -Continue Ozempic.  Patient to provide supply from home -Address bipolar disorder          DVT prophylaxis:    apixaban (ELIQUIS) tablet 5 mg  Code Status: Full code Family Communication: None at bedside Level of care: Telemetry Status is: Inpatient Remains inpatient appropriate because: SNF but difficult to place.   Final disposition: SNF Consultants:  None  Sch Meds:  Scheduled Meds:  apixaban  5 mg Oral BID   bariatric multiple vitamin  2 tablet Oral Daily   bumetanide  2 mg Oral Daily   dofetilide  500 mcg Oral BID   fluticasone furoate-vilanterol  1 puff Inhalation Daily   insulin aspart  0-15 Units Subcutaneous TID WC   insulin aspart  0-5 Units Subcutaneous QHS   iron polysaccharides  150 mg Oral Daily   losartan  50 mg Oral Daily   melatonin  10 mg Oral QHS   nystatin   Topical BID   pantoprazole  40 mg Oral Daily   polyethylene glycol  17 g Oral Daily   potassium chloride  40 mEq Oral Daily   pregabalin  100 mg Oral BID   propranolol  80 mg Oral TID   silver sulfADIAZINE  1 Application Topical Daily   sodium chloride flush  3 mL Intravenous Q12H   spironolactone  25 mg Oral Daily   triamcinolone cream  1 Application Topical BID  Continuous Infusions:  sodium chloride 10 mL (02/16/22 1530)    ceFAZolin (ANCEF) IV 2 g (02/20/22 1325)   PRN Meds:.sodium chloride, acetaminophen, hydrOXYzine, oxyCODONE, simethicone, sodium chloride flush, zolpidem  Antimicrobials: Anti-infectives (From admission, onward)    Start     Dose/Rate Route Frequency Ordered Stop   02/20/22 1200  fluconazole (DIFLUCAN) tablet 150 mg        150 mg Oral  Once 02/20/22 1112 02/20/22 1223   02/16/22 1400  ceFAZolin (ANCEF) IVPB 2g/100 mL premix        2 g 200 mL/hr over 30 Minutes Intravenous Every 8 hours 02/16/22 0938 02/23/22 1359   02/16/22 0930  ceFAZolin (ANCEF) IVPB 2g/100 mL premix  Status:  Discontinued        2 g 200 mL/hr over 30 Minutes Intravenous Every 8 hours 02/16/22 0929 02/16/22 0938   02/16/22 0715  ceFAZolin (ANCEF) IVPB 2g/100 mL premix        2 g 200 mL/hr over 30  Minutes Intravenous  Once 02/16/22 0709 02/16/22 0830        I have personally reviewed the following labs and images: CBC: Recent Labs  Lab 02/16/22 0539 02/16/22 0554 02/17/22 1042 02/18/22 0423 02/19/22 0503 02/20/22 0832  WBC 13.0*  --  8.4 8.9 9.0  --   NEUTROABS 9.6*  --   --   --   --   --   HGB 8.7* 10.5* 7.9* 8.1* 8.1* 9.3*  HCT 30.7* 31.0* 28.2* 29.4* 28.9* 33.7*  MCV 88.5  --  91.0 91.9 90.3  --   PLT 275  --  191 170 175  --    BMP &GFR Recent Labs  Lab 02/16/22 0539 02/16/22 0554 02/17/22 1042 02/18/22 0423 02/18/22 0442 02/19/22 0503 02/20/22 0832  NA 134* 134* 138  --  138 138 137  K 4.1 4.2 3.6  --  3.9 3.7 3.7  CL 98 94* 101  --  100 98 96*  CO2 26  --  29  --  31 29 32  GLUCOSE 138* 140* 145*  --  127* 117* 137*  BUN 11 8 10   --  7 6 7   CREATININE 0.94 0.80 1.01*  --  0.78 0.83 0.82  CALCIUM 8.7*  --  8.3*  --  8.5* 8.6* 8.8*  MG 2.1  --  2.0 1.9  --  1.8 2.1  PHOS  --   --  4.4  --   --  4.2 3.9   Estimated Creatinine Clearance: 147.7 mL/min (by C-G formula based on SCr of 0.82 mg/dL). Liver & Pancreas: Recent Labs  Lab 02/16/22 0539 02/17/22 1042 02/19/22 0503 02/20/22 0832  AST 31  --   --   --   ALT 19  --   --   --   ALKPHOS 74  --   --   --   BILITOT 0.9  --   --   --   PROT 7.6  --   --   --   ALBUMIN 3.4* 3.1* 3.0* 3.2*   No results for input(s): "LIPASE", "AMYLASE" in the last 168 hours. No results for input(s): "AMMONIA" in the last 168 hours. Diabetic: No results for input(s): "HGBA1C" in the last 72 hours.  Recent Labs  Lab 02/19/22 1148 02/19/22 1655 02/19/22 2139 02/20/22 0747 02/20/22 1200  GLUCAP 127* 108* 162* 147* 123*  123*   Cardiac Enzymes: Recent Labs  Lab 02/18/22 0423  CKTOTAL 104   No results for  input(s): "PROBNP" in the last 8760 hours. Coagulation Profile: No results for input(s): "INR", "PROTIME" in the last 168 hours. Thyroid Function Tests: Recent Labs    02/18/22 0438  TSH 2.307    Lipid Profile: Recent Labs    02/18/22 0423  CHOL 134  HDL 26*  LDLCALC 86  TRIG 111  CHOLHDL 5.2   Anemia Panel: No results for input(s): "VITAMINB12", "FOLATE", "FERRITIN", "TIBC", "IRON", "RETICCTPCT" in the last 72 hours.  Urine analysis:    Component Value Date/Time   COLORURINE YELLOW 02/16/2022 [place] 02/16/2022 0757   LABSPEC <1.005 (L) 02/16/2022 0757   PHURINE 6.0 02/16/2022 0757   GLUCOSEU NEGATIVE 02/16/2022 0757   HGBUR NEGATIVE 02/16/2022 0757   BILIRUBINUR NEGATIVE 02/16/2022 0757   BILIRUBINUR negative 07/28/2016 1614   BILIRUBINUR neg 08/29/2014 1528   KETONESUR NEGATIVE 02/16/2022 0757   PROTEINUR NEGATIVE 02/16/2022 0757   UROBILINOGEN 0.2 07/28/2016 1614   NITRITE NEGATIVE 02/16/2022 0757   LEUKOCYTESUR NEGATIVE 02/16/2022 0757   Sepsis Labs: Invalid input(s): "PROCALCITONIN", "LACTICIDVEN"  Microbiology: No results found for this or any previous visit (from the past 240 hour(s)).  Radiology Studies: No results found.    Jalaiya Oyster T. [place]  If 7PM-7AM, please contact night-coverage [email or web address] 02/20/2022, 3:47 PM

## 2022-02-20 NOTE — Progress Notes (Signed)
Pt transferred to 1328 d/t a facility concern and no bed availability on 3E. Report called to [facility] and additional information given while setting up pt at bedside. Pt stable and all belongings transferred with pt.

## 2022-02-20 NOTE — TOC Progression Note (Signed)
Transition of Care [facility]) - Progression Note   Patient Details  Name: Alyssa Blanchard MRN: [id number] Date of Birth: Aug 15, 1970  [facility] CM/SW Contact  Ewing Schlein, LCSW Phone Number: 02/20/2022, 2:51 PM  Clinical Narrative: CSW and [facility] supervisor made multiple attempts to contact Nachelle [phone]) with [facility], but have been unable to reach her or leave a VM. Patient continues to have no bed offers.  Expected Discharge Plan: Skilled Nursing Facility Barriers to Discharge: SNF Pending bed offer, Continued Medical Work up  Expected Discharge Plan and Services Expected Discharge Plan: Skilled Nursing Facility In-house Referral: Clinical Social Work Post Acute Care Choice: Skilled Nursing Facility Living arrangements for the past 2 months: Single Family Home Expected Discharge Date: 02/19/22               DME Arranged: N/A DME Agency: NA  Social Determinants of Health (SDOH) Interventions Food Insecurity Interventions: Intervention Not Indicated Housing Interventions: Intervention Not Indicated Transportation Interventions: Intervention Not Indicated Utilities Interventions: Intervention Not Indicated  Readmission Risk Interventions     No data to display

## 2022-02-20 NOTE — Progress Notes (Signed)
Pt continues to refuse CPAP qhs. 

## 2022-02-21 DIAGNOSIS — L03115 Cellulitis of right lower limb: Secondary | ICD-10-CM | POA: Diagnosis not present

## 2022-02-21 DIAGNOSIS — F319 Bipolar disorder, unspecified: Secondary | ICD-10-CM | POA: Diagnosis not present

## 2022-02-21 DIAGNOSIS — I5042 Chronic combined systolic (congestive) and diastolic (congestive) heart failure: Secondary | ICD-10-CM | POA: Diagnosis not present

## 2022-02-21 LAB — RENAL FUNCTION PANEL
Albumin: 3.5 g/dL (ref 3.5–5.0)
Anion gap: 13 (ref 5–15)
BUN: 10 mg/dL (ref 6–20)
CO2: 31 mmol/L (ref 22–32)
Calcium: 8.9 mg/dL (ref 8.9–10.3)
Chloride: 94 mmol/L — ABNORMAL LOW (ref 98–111)
Creatinine, Ser: 0.87 mg/dL (ref 0.44–1.00)
GFR, Estimated: >60 mL/min (ref >60)
Glucose, Bld: 110 mg/dL — ABNORMAL HIGH (ref 70–99)
Phosphorus: 4.3 mg/dL (ref 2.5–4.6)
Potassium: 3.9 mmol/L (ref 3.5–5.1)
Sodium: 138 mmol/L (ref 135–145)

## 2022-02-21 LAB — HEMOGLOBIN AND HEMATOCRIT, BLOOD
HCT: 32.8 % — ABNORMAL LOW (ref 36.0–46.0)
Hemoglobin: 9.3 g/dL — ABNORMAL LOW (ref 12.0–15.0)

## 2022-02-21 LAB — GLUCOSE, CAPILLARY
Glucose-Capillary: 110 mg/dL — ABNORMAL HIGH (ref 70–99)
Glucose-Capillary: 127 mg/dL — ABNORMAL HIGH (ref 70–99)
Glucose-Capillary: 123 mg/dL — ABNORMAL HIGH (ref 70–99)
Glucose-Capillary: 169 mg/dL — ABNORMAL HIGH (ref 70–99)

## 2022-02-21 LAB — MAGNESIUM: Magnesium: 1.8 mg/dL (ref 1.7–2.4)

## 2022-02-21 MED ORDER — PROMETHAZINE HCL 25 MG PO TABS
12.5000 mg | ORAL_TABLET | Freq: Four times a day (QID) | ORAL | Status: DC | PRN
  Administered 2022-02-22 – 2022-02-25 (×3): 12.5 mg via ORAL
  Filled 2022-02-21 (×3): qty 1

## 2022-02-21 MED ORDER — POTASSIUM CHLORIDE CRYS ER 20 MEQ PO TBCR
40.0000 meq | EXTENDED_RELEASE_TABLET | Freq: Two times a day (BID) | ORAL | Status: DC
  Administered 2022-02-21 – 2022-02-28 (×15): 40 meq via ORAL
  Filled 2022-02-21 (×15): qty 2

## 2022-02-21 MED ORDER — BUSPIRONE HCL 10 MG PO TABS
10.0000 mg | ORAL_TABLET | Freq: Two times a day (BID) | ORAL | Status: DC
  Administered 2022-02-21 – 2022-02-28 (×15): 10 mg via ORAL
  Filled 2022-02-21 (×15): qty 1

## 2022-02-21 MED ORDER — PROMETHAZINE HCL 25 MG PO TABS
12.5000 mg | ORAL_TABLET | Freq: Once | ORAL | Status: AC
  Administered 2022-02-21: 12.5 mg via ORAL
  Filled 2022-02-21: qty 1

## 2022-02-21 MED ORDER — LEVALBUTEROL HCL 0.63 MG/3ML IN NEBU: 0.6300 mg | INHALATION_SOLUTION | Freq: Four times a day (QID) | RESPIRATORY_TRACT | Status: DC | PRN

## 2022-02-21 MED ORDER — ARIPIPRAZOLE 10 MG PO TABS
5.0000 mg | ORAL_TABLET | Freq: Every day | ORAL | Status: DC
  Filled 2022-02-21 (×2): qty 1

## 2022-02-21 MED ORDER — MAGNESIUM SULFATE IN D5W 1-5 GM/100ML-% IV SOLN
1.0000 g | Freq: Once | INTRAVENOUS | Status: AC
  Administered 2022-02-21: 1 g via INTRAVENOUS
  Filled 2022-02-21: qty 100

## 2022-02-21 NOTE — Progress Notes (Signed)
PROGRESS NOTE  Alyssa Blanchard DOB: 1970-08-29   PCP: Patrecia Pour, Christean Grief, MD  Patient is from: Lives with mother.  Reports using rolling walker at baseline  DOA: 02/16/2022 LOS: 5  Chief complaints Chief Complaint  Patient presents with   Leg Pain     Brief Narrative / Interim history: 51 year old F with PMH of combined CHF, morbid obesity, A-fib on Eliquis, possible OHS, prolonged QT, lumbar radiculopathy, BPD, fibromyalgia and iron deficiency anemia presenting with bilateral leg swelling,, weight gain, right thigh pain and recurrent falls at home, and admitted for right lower extremity cellulitis, and acute on chronic right lumbar radiculopathy.  She was started on IV Ancef.  MRI lumbar spine ordered but patient was not able to feel MRI due to morbid obesity.  Pain and cellulitis seems to have improved.  Therapy recommended SNF.  She is medically optimized for discharge pending SNF difficult to place she requires bariatric bed.  Subjective: Seen and examined earlier this morning.  No major events overnight of this morning.  No complaints other than some nausea after using his Ozempic.  Likes to resume her buspirone as well.  Objective: Vitals:   02/20/22 2122 02/21/22 0548 02/21/22 0936 02/21/22 0938  BP: 113/60 (!) 117/58    Pulse: 71 70    Resp: 17 17    Temp: 98.5 F (36.9 C) 98.9 F (37.2 C)    TempSrc:      SpO2: 99% 99% 98% 98%  Weight:      Height:        Examination:  GENERAL: No apparent distress.  Nontoxic. HEENT: MMM.  Vision and hearing grossly intact.  NECK: Supple.  Difficult to assess JVD. RESP:  No IWOB.  Fair aeration bilaterally but limited exam CVS:  RRR. Heart sounds normal.  ABD/GI/GU: BS+. Abd soft, NTND.  MSK/EXT:  Moves extremities. No apparent deformity.  Difficult to assess edema but seems to have improved SKIN: no apparent skin lesion or wound NEURO: Awake and alert. Oriented appropriately.  No apparent focal neuro  deficit. PSYCH: Calm. Normal affect.   Procedures:  None  Microbiology summarized: None  Assessment and plan: Principal Problem:   Cellulitis of right leg Active Problems:   Class 3 severe obesity due to excess calories with serious comorbidity and body mass index (BMI) greater than or equal to 70 in adult [facility])   Paroxysmal atrial fibrillation (HCC)   Bipolar 1 disorder (HCC)   Chronic combined systolic and diastolic congestive heart failure (HCC)   Type 2 diabetes mellitus without complication, without long-term current use of insulin (HCC)   Lumbar radiculopathy, right   Anemia   Gastroesophageal reflux disease   Hypoventilation associated with obesity syndrome (HCC)   Iron deficiency anemia   Prolonged QT interval  Possible right leg cellulitis: It is unclear if the erythema is really cellulitis or intertrigo since it is mainly in skin flexures.   -Completed 5 days of IV Ancef. -Continue nystatin powders for possible intertrigo.  Received Diflucan on 12/14. -Leg elevation as able -Continue home diuretics.  Lumbar radiculopathy, right: Chronic issue.  She is already on Lyrica.  Not able to get lumbar MRI due to body habitus. -Continue Lyrica to 100 mg twice daily.  Takes once a day at home. -P.o. oxycodone 5 mg every 6 hours as needed -Change Toradol to as needed -PT/OT-recommended SNF.   Iron deficiency anemia: H&H seems to be better than baseline.  Anemia panel with significant iron deficiency. Recent Labs  01/28/22 1623 01/29/22 0026 01/31/22 1250 02/16/22 0539 02/16/22 0554 02/17/22 1042 02/18/22 0423 02/19/22 0503 02/20/22 0832 02/21/22 0337  HGB 9.9* 8.1* 8.3* 8.7* 10.5* 7.9* 8.1* 8.1* 9.3* 9.3*  -Received IV ferric gluconate 250 mg daily x 2 -P.o. Protonix -Monitor H&H   Controlled NIDDM-2: A1c 6.1% Recent Labs  Lab 02/20/22 1200 02/20/22 1745 02/20/22 2124 02/21/22 0743 02/21/22 1151  GLUCAP 123*  123* 113* 170* 110* 127*  -Continue  SSI-moderate -Okay to continue home Ozempic if she can bring from home -Hold home glipizide.  Glipizide may cause weight gain. -SSI-moderate  Chronic combined CHF: TTE in 2021 with LVEF of 30 to 35%, GH and normal RV function.Marland Kitchen  Presents with BLE edema and weight gain but not able to elaborate how much she gained.  Difficult to assess fluid status.  No significant cardiopulmonary symptoms.  On home Bumex.  Net -20 L.  Blood pressure and creatinine stable. -Continue home Bumex and Aldactone. -Continue losartan 50 mg daily -Optimize electrolytes.  Increased p.o. KCl to 40 mg twice daily.  Patient is on Tikosyn. -Strict intake and output and daily weight  Paroxysmal A-fib/prolonged QT.  Rate controlled.  Not in RVR.  QTc 498.  She is on Tikosyn. -Continue telemetry monitoring -Continue Tikosyn, Inderal and Eliquis -Optimize electrolytes. -Increase p.o. KCl to 40 mEq twice daily. -IV magnesium to 1 g x 1  Bipolar 1 disorder: stopped all psychiatric meds 2/2 side effects. Is not currently seeing psychiatrist or therapist. No imminent danger. -Resumed home BuSpar and Abilify. -Continue home Atarax. -TOC and PCP to establish with psychiatrist.   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  Continue nystatin powders   Gastroesophageal reflux disease -Continue PPI  Ambulatory dysfunction: Reports using walker at baseline. -PT/OT-recommended SNF.  Obesity hypoventilation syndrome/possible OSA-did not tolerate CPAP at night. -Needs sleep study.  Eczema -Continue home triamcinolone and Atarax.  Vitamin D deficiency: Ruled out.  Vitamin D level 36.   Morbid obesity: Markedly elevated BMI and ambulatory dysfunction Body mass index is 94.72 kg/m. -Continue Ozempic.  -Address bipolar disorder          DVT prophylaxis:   apixaban (ELIQUIS) tablet 5 mg  Code Status: Full code Family Communication: None  at bedside Level of care: Telemetry Status is: Inpatient Remains inpatient appropriate because: SNF but difficult to place.   Final disposition: SNF Consultants:  None  Sch Meds:  Scheduled Meds:  apixaban  5 mg Oral BID   bariatric multiple vitamin  2 tablet Oral Daily   bumetanide  2 mg Oral Daily   busPIRone  10 mg Oral BID   dofetilide  500 mcg Oral BID   fluticasone furoate-vilanterol  1 puff Inhalation Daily   insulin aspart  0-15 Units Subcutaneous TID WC   insulin aspart  0-5 Units Subcutaneous QHS   iron polysaccharides  150 mg Oral Daily   losartan  50 mg Oral Daily   melatonin  10 mg Oral QHS   nystatin   Topical BID   pantoprazole  40 mg Oral Daily   polyethylene glycol  17 g Oral Daily   potassium chloride  40 mEq Oral BID   pregabalin  100 mg Oral BID   propranolol  80 mg Oral TID   silver sulfADIAZINE  1 Application Topical Daily   sodium chloride flush  3 mL Intravenous Q12H   spironolactone  25 mg Oral Daily   triamcinolone cream  1 Application  Topical BID   Continuous Infusions:  sodium chloride 10 mL (02/16/22 1530)    ceFAZolin (ANCEF) IV 2 g (02/21/22 1302)   PRN Meds:.sodium chloride, acetaminophen, hydrOXYzine, oxyCODONE, promethazine, simethicone, sodium chloride flush, zolpidem  Antimicrobials: Anti-infectives (From admission, onward)    Start     Dose/Rate Route Frequency Ordered Stop   02/20/22 1200  fluconazole (DIFLUCAN) tablet 150 mg        150 mg Oral  Once 02/20/22 1112 02/20/22 1223   02/16/22 1400  ceFAZolin (ANCEF) IVPB 2g/100 mL premix        2 g 200 mL/hr over 30 Minutes Intravenous Every 8 hours 02/16/22 0938 02/23/22 1359   02/16/22 0930  ceFAZolin (ANCEF) IVPB 2g/100 mL premix  Status:  Discontinued        2 g 200 mL/hr over 30 Minutes Intravenous Every 8 hours 02/16/22 0929 02/16/22 0938   02/16/22 0715  ceFAZolin (ANCEF) IVPB 2g/100 mL premix        2 g 200 mL/hr over 30 Minutes Intravenous  Once 02/16/22 0709 02/16/22  0830        I have personally reviewed the following labs and images: CBC: Recent Labs  Lab 02/16/22 0539 02/16/22 0554 02/17/22 1042 02/18/22 0423 02/19/22 0503 02/20/22 0832 02/21/22 0337  WBC 13.0*  --  8.4 8.9 9.0  --   --   NEUTROABS 9.6*  --   --   --   --   --   --   HGB 8.7*   < > 7.9* 8.1* 8.1* 9.3* 9.3*  HCT 30.7*   < > 28.2* 29.4* 28.9* 33.7* 32.8*  MCV 88.5  --  91.0 91.9 90.3  --   --   PLT 275  --  191 170 175  --   --    < > = values in this interval not displayed.   BMP &GFR Recent Labs  Lab 02/17/22 1042 02/18/22 0423 02/18/22 0442 02/19/22 0503 02/20/22 0832 02/21/22 0337  NA 138  --  138 138 137 138  K 3.6  --  3.9 3.7 3.7 3.9  CL 101  --  100 98 96* 94*  CO2 29  --  31 29 32 31  GLUCOSE 145*  --  127* 117* 137* 110*  BUN 10  --  7 6 7 10   CREATININE 1.01*  --  0.78 0.83 0.82 0.87  CALCIUM 8.3*  --  8.5* 8.6* 8.8* 8.9  MG 2.0 1.9  --  1.8 2.1 1.8  PHOS 4.4  --   --  4.2 3.9 4.3   Estimated Creatinine Clearance: 139.2 mL/min (by C-G formula based on SCr of 0.87 mg/dL). Liver & Pancreas: Recent Labs  Lab 02/16/22 0539 02/17/22 1042 02/19/22 0503 02/20/22 0832 02/21/22 0337  AST 31  --   --   --   --   ALT 19  --   --   --   --   ALKPHOS 74  --   --   --   --   BILITOT 0.9  --   --   --   --   PROT 7.6  --   --   --   --   ALBUMIN 3.4* 3.1* 3.0* 3.2* 3.5   No results for input(s): "LIPASE", "AMYLASE" in the last 168 hours. No results for input(s): "AMMONIA" in the last 168 hours. Diabetic: No results for input(s): "HGBA1C" in the last 72 hours.  Recent Labs  Lab 02/20/22 1200 02/20/22  1745 02/20/22 2124 02/21/22 0743 02/21/22 1151  GLUCAP 123*  123* 113* 170* 110* 127*   Cardiac Enzymes: Recent Labs  Lab 02/18/22 0423  CKTOTAL 104   No results for input(s): "PROBNP" in the last 8760 hours. Coagulation Profile: No results for input(s): "INR", "PROTIME" in the last 168 hours. Thyroid Function Tests: No results for  input(s): "TSH", "T4TOTAL", "FREET4", "T3FREE", "THYROIDAB" in the last 72 hours.  Lipid Profile: No results for input(s): "CHOL", "HDL", "LDLCALC", "TRIG", "CHOLHDL", "LDLDIRECT" in the last 72 hours.  Anemia Panel: No results for input(s): "VITAMINB12", "FOLATE", "FERRITIN", "TIBC", "IRON", "RETICCTPCT" in the last 72 hours.  Urine analysis:    Component Value Date/Time   COLORURINE YELLOW 02/16/2022 [place] 02/16/2022 0757   LABSPEC <1.005 (L) 02/16/2022 0757   PHURINE 6.0 02/16/2022 0757   GLUCOSEU NEGATIVE 02/16/2022 0757   HGBUR NEGATIVE 02/16/2022 0757   BILIRUBINUR NEGATIVE 02/16/2022 0757   BILIRUBINUR negative 07/28/2016 1614   BILIRUBINUR neg 08/29/2014 1528   KETONESUR NEGATIVE 02/16/2022 0757   PROTEINUR NEGATIVE 02/16/2022 0757   UROBILINOGEN 0.2 07/28/2016 1614   NITRITE NEGATIVE 02/16/2022 0757   LEUKOCYTESUR NEGATIVE 02/16/2022 0757   Sepsis Labs: Invalid input(s): "PROCALCITONIN", "LACTICIDVEN"  Microbiology: No results found for this or any previous visit (from the past 240 hour(s)).  Radiology Studies: No results found.    Edna Grover T. Galt  If 7PM-7AM, please contact night-coverage [email or web address] 02/21/2022, 2:29 PM

## 2022-02-21 NOTE — Progress Notes (Signed)
Pt continues to refuse CPAP qhs. 

## 2022-02-22 DIAGNOSIS — L03115 Cellulitis of right lower limb: Secondary | ICD-10-CM | POA: Diagnosis not present

## 2022-02-22 LAB — GLUCOSE, CAPILLARY
Glucose-Capillary: 126 mg/dL — ABNORMAL HIGH (ref 70–99)
Glucose-Capillary: 125 mg/dL — ABNORMAL HIGH (ref 70–99)
Glucose-Capillary: 141 mg/dL — ABNORMAL HIGH (ref 70–99)
Glucose-Capillary: 156 mg/dL — ABNORMAL HIGH (ref 70–99)

## 2022-02-22 LAB — RENAL FUNCTION PANEL
Albumin: 3.6 g/dL (ref 3.5–5.0)
Anion gap: 9 (ref 5–15)
BUN: 14 mg/dL (ref 6–20)
CO2: 35 mmol/L — ABNORMAL HIGH (ref 22–32)
Calcium: 9 mg/dL (ref 8.9–10.3)
Chloride: 94 mmol/L — ABNORMAL LOW (ref 98–111)
Creatinine, Ser: 0.95 mg/dL (ref 0.44–1.00)
GFR, Estimated: >60 mL/min (ref >60)
Glucose, Bld: 124 mg/dL — ABNORMAL HIGH (ref 70–99)
Phosphorus: 4.7 mg/dL — ABNORMAL HIGH (ref 2.5–4.6)
Potassium: 4.2 mmol/L (ref 3.5–5.1)
Sodium: 138 mmol/L (ref 135–145)

## 2022-02-22 LAB — HEMOGLOBIN AND HEMATOCRIT, BLOOD
HCT: 33.6 % — ABNORMAL LOW (ref 36.0–46.0)
Hemoglobin: 9.3 g/dL — ABNORMAL LOW (ref 12.0–15.0)

## 2022-02-22 LAB — MAGNESIUM: Magnesium: 2.1 mg/dL (ref 1.7–2.4)

## 2022-02-22 NOTE — Plan of Care (Signed)
  Problem: Education: Goal: Knowledge of General Education information will improve Description Including pain rating scale, medication(s)/side effects and non-pharmacologic comfort measures Outcome: Progressing   

## 2022-02-22 NOTE — Progress Notes (Signed)
Progress Note   Patient: Alyssa Blanchard VQX:[id number] DOB: 02-Oct-1970 DOA: 02/16/2022     6 DOS: the patient was seen and examined on 02/22/2022   Brief hospital course: 51 year old F with PMH of combined CHF, morbid obesity, A-fib on Eliquis, possible OHS, prolonged QT, lumbar radiculopathy, BPD, fibromyalgia and iron deficiency anemia presenting with bilateral leg swelling,, weight gain, right thigh pain and recurrent falls at home, and admitted for right lower extremity cellulitis, and acute on chronic right lumbar radiculopathy.  She was started on IV Ancef.  MRI lumbar spine ordered but patient was not able to feel MRI due to morbid obesity.   Pain and cellulitis seems to have improved.  Therapy recommended SNF.  She is medically optimized for discharge pending SNF difficult to place she requires bariatric bed.  Assessment and Plan: Possible right leg cellulitis:  -It is unclear if the erythema is really cellulitis or intertrigo since it is mainly in skin flexures.   -Completed 5 days of IV Ancef. -Continue nystatin powders for possible intertrigo.  Received Diflucan on 12/14. -Leg elevation as able -Continue home diuretics as tolerated. Urinating well   Lumbar radiculopathy, right: Chronic issue.  She is already on Lyrica.  Not able to get lumbar MRI due to body habitus. -Continue Lyrica to 100 mg twice daily.  Takes once a day at home. -P.o. oxycodone 5 mg every 6 hours as needed -Change Toradol to as needed -PT/OT-recommended SNF, pending placement   Iron deficiency anemia: H&H seems to be better than baseline.  Anemia panel with significant iron deficiency. -Received IV ferric gluconate 250 mg daily x 2 -P.o. Protonix -recheck cbc in AM   Controlled NIDDM-2: A1c 6.1% -Continue SSI-moderate -Okay to continue home Ozempic if she can bring from home -Holding home glipizide while in hospital.  Glipizide may cause weight gain. -Cont SSI as needed   Chronic combined CHF:  TTE in 2021 with LVEF of 30 to 35%, GH and normal RV function.Marland Kitchen  Presents with BLE edema and weight gain but not able to elaborate how much she gained.  Difficult to assess fluid status.  No significant cardiopulmonary symptoms.  On home Bumex.  Blood pressure and creatinine stable. -Continue home Bumex and Aldactone. -Continue losartan 50 mg daily -Optimize electrolytes.  Increased p.o. KCl to 40 mg twice daily.  Patient is on Tikosyn. -Strict intake and output and daily weight   Paroxysmal A-fib/prolonged QT.  Rate controlled.  Not in RVR.  QTc 498.  She is on Tikosyn. -Continue telemetry monitoring -Continue Tikosyn, Inderal and Eliquis -Optimize electrolytes. -Continue p.o. KCl to 40 mEq twice daily.   Bipolar 1 disorder: stopped all psychiatric meds 2/2 side effects. Is not currently seeing psychiatrist or therapist. No imminent danger. -Resumed home BuSpar and Abilify. -Continue home Atarax. -TOC and PCP to establish with psychiatrist.    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  Continue nystatin powders   Gastroesophageal reflux disease -Continue PPI   Ambulatory dysfunction: Reports using walker at baseline. -PT/OT-recommended SNF.   Obesity hypoventilation syndrome/possible OSA -Needs sleep study. -Pt trying to get used to CPAP while in hospital   Eczema -Continue home triamcinolone and Atarax.   Vitamin D deficiency: Ruled out.  Vitamin D level 36.   Morbid obesity: Markedly elevated BMI and ambulatory dysfunction Body mass index is 94.72 kg/m. -Continue Ozempic.  -Address bipolar disorder      Subjective: Without complaints this AM  Physical Exam:  Vitals:   02/22/22 0854 02/22/22 1147 02/22/22 1428 02/22/22 1442  BP:  120/63 (!) 88/45 (!) 133/96  Pulse:  73 73 75  Resp:   17   Temp:   98.7 F (37.1 C)   TempSrc:      SpO2: 96%  100%   Weight:      Height:        General exam: Awake, laying in bed, in nad Respiratory system: Normal respiratory effort, no wheezing Cardiovascular system: regular rate, s1, s2 Gastrointestinal system: Soft, nondistended, positive BS Central nervous system: CN2-12 grossly intact, strength intact Extremities: Perfused, no clubbing Skin: Normal skin turgor, no notable skin lesions seen Psychiatry: Mood normal // no visual hallucinations   Data Reviewed:  Labs reviewed: Na 138, K 4.2, Cr 0.95, Hgb 9.3   Family Communication: Pt in room, family not at bedside  Disposition: Status is: Inpatient Remains inpatient appropriate because: d/c planning and severity of illness  Planned Discharge Destination: Skilled nursing facility    Author: Rickey Barbara, MD 02/22/2022 5:37 PM  For on call review [email or web address].

## 2022-02-22 NOTE — Progress Notes (Signed)
Contacted by RN after Pt asked to wear her CPAP.  Pt resting comfortably on CPAP at this time.

## 2022-02-22 NOTE — Hospital Course (Signed)
51 year old F with PMH of combined CHF, morbid obesity, A-fib on Eliquis, possible OHS, prolonged QT, lumbar radiculopathy, BPD, fibromyalgia and iron deficiency anemia presenting with bilateral leg swelling,, weight gain, right thigh pain and recurrent falls at home, and admitted for right lower extremity cellulitis, and acute on chronic right lumbar radiculopathy.  She was started on IV Ancef.  MRI lumbar spine ordered but patient was not able to feel MRI due to morbid obesity.   Pain and cellulitis seems to have improved.  Therapy recommended SNF.  She is medically optimized for discharge pending SNF difficult to place she requires bariatric bed.

## 2022-02-22 NOTE — Progress Notes (Signed)
PT Cancellation Note  Patient Details Name: Britainy Kozub MRN: [id number] DOB: 10/18/1970   Cancelled Treatment:    Reason Eval/Treat Not Completed:  Attempted PT tx sesion-RN and NT in room cleaning pt up. Will check back as schedule allows.    Faye Ramsay, PT Acute Rehabilitation  Office: [phone]

## 2022-02-23 DIAGNOSIS — L03115 Cellulitis of right lower limb: Secondary | ICD-10-CM | POA: Diagnosis not present

## 2022-02-23 LAB — CBC
HCT: 37.5 % (ref 36.0–46.0)
Hemoglobin: 10.2 g/dL — ABNORMAL LOW (ref 12.0–15.0)
MCH: 26.1 pg (ref 26.0–34.0)
MCHC: 27.2 g/dL — ABNORMAL LOW (ref 30.0–36.0)
MCV: 95.9 fL (ref 80.0–100.0)
Platelets: 220 10*3/uL (ref 150–400)
RBC: 3.91 MIL/uL (ref 3.87–5.11)
RDW: 20.9 % — ABNORMAL HIGH (ref 11.5–15.5)
WBC: 10.4 10*3/uL (ref 4.0–10.5)
nRBC: 0 % (ref 0.0–0.2)

## 2022-02-23 LAB — GLUCOSE, CAPILLARY
Glucose-Capillary: 127 mg/dL — ABNORMAL HIGH (ref 70–99)
Glucose-Capillary: 123 mg/dL — ABNORMAL HIGH (ref 70–99)
Glucose-Capillary: 115 mg/dL — ABNORMAL HIGH (ref 70–99)
Glucose-Capillary: 138 mg/dL — ABNORMAL HIGH (ref 70–99)

## 2022-02-23 LAB — COMPREHENSIVE METABOLIC PANEL
ALT: 11 U/L (ref 0–44)
AST: 34 U/L (ref 15–41)
Albumin: 4 g/dL (ref 3.5–5.0)
Alkaline Phosphatase: 73 U/L (ref 38–126)
Anion gap: 16 — ABNORMAL HIGH (ref 5–15)
BUN: 17 mg/dL (ref 6–20)
CO2: 28 mmol/L (ref 22–32)
Calcium: 9.3 mg/dL (ref 8.9–10.3)
Chloride: 94 mmol/L — ABNORMAL LOW (ref 98–111)
Creatinine, Ser: 0.94 mg/dL (ref 0.44–1.00)
GFR, Estimated: >60 mL/min (ref >60)
Glucose, Bld: 120 mg/dL — ABNORMAL HIGH (ref 70–99)
Potassium: 4 mmol/L (ref 3.5–5.1)
Sodium: 138 mmol/L (ref 135–145)
Total Bilirubin: 0.6 mg/dL (ref 0.3–1.2)
Total Protein: 8.5 g/dL — ABNORMAL HIGH (ref 6.5–8.1)

## 2022-02-23 NOTE — Progress Notes (Signed)
Occupational Therapy Treatment Patient Details Name: Alyssa Blanchard MRN: [id number] DOB: 02/13/1971 Today's Date: 02/23/2022   History of present illness Patient is a 51 year old female who presented after fall at home with weight gain and leg pain. Patient unable to go to MRI dur to body habitus. X-ray revealed moderate DJD of lumbar. Patient was admitted with cellulitis of R leg, anemia, and right lumbar radiculopathy. LPF:[id number] disorder, HFrEF, a fib, obesity,   OT comments  Treatment focused on getting patient in chair position - of her back - and performing exercises to maintain ROM and increase strength in preparation for mobility and OOB ADLs. Patient declined use of hoyer to chair today. Patient provided with green theraband and shown UE exercises to perform on her own. Continue POC.    Recommendations for follow up therapy are one component of a multi-disciplinary discharge planning process, led by the attending physician.  Recommendations may be updated based on patient status, additional functional criteria and insurance authorization.    Follow Up Recommendations  Skilled nursing-short term rehab (<3 hours/day)     Assistance Recommended at Discharge Frequent or constant Supervision/Assistance  Patient can return home with the following  Two people to help with walking and/or transfers;Two people to help with bathing/dressing/bathroom;Direct supervision/assist for financial management;Help with stairs or ramp for entrance;Assist for transportation;Direct supervision/assist for medications management;Assistance with cooking/housework   Equipment Recommendations  None recommended by OT    Recommendations for Other Services      Precautions / Restrictions Precautions Precautions: Fall Restrictions Weight Bearing Restrictions: No          Cognition Arousal/Alertness: Awake/alert Behavior During Therapy: WFL for tasks assessed/performed Overall Cognitive Status:  Within Functional Limits for tasks assessed                                          Exercises Other Exercises Other Exercises: Knee extension x 20 each leg in chair position. Other Exercises: modified sit ups in reclined chair position x 10 without arm assist Other Exercises: UE Alternating taps to promote UE and trunk movement Other Exercises: Instructed on UE exercises to perform in bed with green band    Shoulder Instructions       General Comments      Pertinent Vitals/ Pain       Pain Assessment Pain Assessment: Faces Faces Pain Scale: Hurts little more Pain Location: Left knee, Right thigh Pain Descriptors / Indicators: Grimacing, Aching Pain Intervention(s): Limited activity within patient's tolerance   Frequency  Min 2X/week        Progress Toward Goals  OT Goals(current goals can now be found in the care plan section)  Progress towards OT goals: Progressing toward goals  Acute Rehab OT Goals Patient Stated Goal: get stronger OT Goal Formulation: With patient Time For Goal Achievement: 03/03/22 Potential to Achieve Goals: Fair  Plan Discharge plan remains appropriate    Co-evaluation          OT goals addressed during session: Strengthening/ROM      AM-PAC OT "6 Clicks" Daily Activity     Outcome Measure   Help from another person eating meals?: A Little Help from another person taking care of personal grooming?: A Little Help from another person toileting, which includes using toliet, bedpan, or urinal?: Total Help from another person bathing (including washing, rinsing, drying)?: Total Help from another person to  put on and taking off regular upper body clothing?: A Lot Help from another person to put on and taking off regular lower body clothing?: Total 6 Click Score: 11    End of Session    OT Visit Diagnosis: Unsteadiness on feet (R26.81);Other abnormalities of gait and mobility (R26.89);Muscle weakness (generalized)  (M62.81);History of falling (Z91.81);Pain   Activity Tolerance Patient tolerated treatment well   Patient Left in bed;with call bell/phone within reach   Nurse Communication  (okay to see)        Time: 1400-1433 OT Time Calculation (min): 33 min  Charges: OT General Charges $OT Visit: 1 Visit OT Treatments $Therapeutic Exercise: 23-37 mins  Gustavo Lah, OTR/L [place]  Office [phone]   Lenward Chancellor 02/23/2022, 4:18 PM

## 2022-02-23 NOTE — Plan of Care (Signed)
  Problem: Education: Goal: Knowledge of General Education information will improve Description: Including pain rating scale, medication(s)/side effects and non-pharmacologic comfort measures Outcome: Progressing   Problem: Pain Managment: Goal: General experience of comfort will improve Outcome: Progressing   Problem: Skin Integrity: Goal: Risk for impaired skin integrity will decrease Outcome: Progressing   

## 2022-02-23 NOTE — Progress Notes (Signed)
Progress Note   Patient: Alyssa Blanchard YWV:[id number] DOB: Jul 23, 1970 DOA: 02/16/2022     7 DOS: the patient was seen and examined on 02/23/2022   Brief hospital course: 51 year old F with PMH of combined CHF, morbid obesity, A-fib on Eliquis, possible OHS, prolonged QT, lumbar radiculopathy, BPD, fibromyalgia and iron deficiency anemia presenting with bilateral leg swelling,, weight gain, right thigh pain and recurrent falls at home, and admitted for right lower extremity cellulitis, and acute on chronic right lumbar radiculopathy.  She was started on IV Ancef.  MRI lumbar spine ordered but patient was not able to feel MRI due to morbid obesity.   Pain and cellulitis seems to have improved.  Therapy recommended SNF.  She is medically optimized for discharge pending SNF difficult to place she requires bariatric bed.  Assessment and Plan: Possible right leg cellulitis:  -It is unclear if the erythema is really cellulitis or intertrigo since it is mainly in skin flexures.   -Completed 5 days of IV Ancef. -Continue nystatin powders for possible intertrigo.  Received Diflucan on 12/14. -Leg elevation as able -Continue home diuretics as tolerated. Continues to void very well   Lumbar radiculopathy, right: Chronic issue.  She is already on Lyrica.  Not able to get lumbar MRI due to body habitus. -Continue Lyrica to 100 mg twice daily.  Takes once a day at home. -P.o. oxycodone 5 mg every 6 hours as needed -Change Toradol to as needed -PT/OT-recommended SNF, pending placement   Iron deficiency anemia: H&H seems to be better than baseline.  Anemia panel with significant iron deficiency. -Received IV ferric gluconate 250 mg daily x 2 -P.o. Protonix -recheck cbc in AM   Controlled NIDDM-2: A1c 6.1% -Continue SSI-moderate -Okay to continue home Ozempic if she can bring from home -Holding home glipizide while in hospital.  Glipizide may cause weight gain. -Cont SSI as needed   Chronic  combined CHF: TTE in 2021 with LVEF of 30 to 35%, GH and normal RV function.Marland Kitchen  Presents with BLE edema and weight gain but not able to elaborate how much she gained.  Difficult to assess fluid status.  No significant cardiopulmonary symptoms.  On home Bumex.  Blood pressure and creatinine stable. -Continue home Bumex and Aldactone. -Continue losartan 50 mg daily -Optimize electrolytes.  Increased p.o. KCl to 40 mg twice daily.  Patient is on Tikosyn. -Strict intake and output and daily weight   Paroxysmal A-fib/prolonged QT.  Rate controlled.  Not in RVR.  QTc 498.  She is on Tikosyn. -Continue telemetry monitoring -Continue Tikosyn, Inderal and Eliquis -Optimize electrolytes. -Continue p.o. KCl to 40 mEq twice daily.   Bipolar 1 disorder: stopped all psychiatric meds 2/2 side effects. Is not currently seeing psychiatrist or therapist. No imminent danger. -Resumed home BuSpar and Abilify. -Continue home Atarax. -TOC and PCP to establish with psychiatrist.    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  Continue nystatin powders   Gastroesophageal reflux disease -Continue PPI   Ambulatory dysfunction: Reports using walker at baseline. -PT/OT-recommended SNF.   Obesity hypoventilation syndrome/possible OSA -Needs sleep study. -Pt trying to get used to CPAP while in hospital   Eczema -Continue home triamcinolone and Atarax.   Vitamin D deficiency: Ruled out.  Vitamin D level 36.   Morbid obesity: Markedly elevated BMI and ambulatory dysfunction Body mass index is 94.72 kg/m. -Continue Ozempic.  -Address bipolar disorder      Subjective: No complaints this AM  Physical Exam: Vitals:   02/22/22 1816 02/22/22 2135 02/23/22 0434 02/23/22 0800  BP: 132/62 (!) 107/43 (!) 107/48 120/61  Pulse: 72 73 68 67  Resp:  16 18 18   Temp:  97.7 F (36.5 C) 98.1 F (36.7 C) 98.1 F (36.7 C)  TempSrc:  Oral  Oral Oral  SpO2: 100% 100% 94% 97%  Weight:      Height:       General exam: Conversant, in no acute distress Respiratory system: normal chest rise, clear, no audible wheezing Cardiovascular system: regular rhythm, s1-s2 Gastrointestinal system: Nondistended, nontender, pos BS Central nervous system: No seizures, no tremors Extremities: No cyanosis, no joint deformities Skin: No rashes, no pallor Psychiatry: Affect normal // no auditory hallucinations   Data Reviewed:  Labs reviewed: Na 138, K 4.0, Cr 0.94, Hgb 10.2   Family Communication: Pt in room, family at bedside  Disposition: Status is: Inpatient Remains inpatient appropriate because: d/c planning and severity of illness  Planned Discharge Destination: Skilled nursing facility    Author: , MD 02/23/2022 3:38 PM  For on call review [email or web address].

## 2022-02-23 NOTE — Plan of Care (Signed)
  Problem: Education: Goal: Knowledge of General Education information will improve Description Including pain rating scale, medication(s)/side effects and non-pharmacologic comfort measures Outcome: Progressing   

## 2022-02-24 ENCOUNTER — Telehealth: Payer: Self-pay

## 2022-02-24 DIAGNOSIS — L03115 Cellulitis of right lower limb: Secondary | ICD-10-CM | POA: Diagnosis not present

## 2022-02-24 LAB — COMPREHENSIVE METABOLIC PANEL
ALT: 13 U/L (ref 0–44)
AST: 41 U/L (ref 15–41)
Albumin: 4.1 g/dL (ref 3.5–5.0)
Alkaline Phosphatase: 67 U/L (ref 38–126)
Anion gap: 9 (ref 5–15)
BUN: 17 mg/dL (ref 6–20)
CO2: 32 mmol/L (ref 22–32)
Calcium: 9.6 mg/dL (ref 8.9–10.3)
Chloride: 95 mmol/L — ABNORMAL LOW (ref 98–111)
Creatinine, Ser: 0.89 mg/dL (ref 0.44–1.00)
GFR, Estimated: >60 mL/min (ref >60)
Glucose, Bld: 118 mg/dL — ABNORMAL HIGH (ref 70–99)
Potassium: 4.5 mmol/L (ref 3.5–5.1)
Sodium: 136 mmol/L (ref 135–145)
Total Bilirubin: 0.7 mg/dL (ref 0.3–1.2)
Total Protein: 8.6 g/dL — ABNORMAL HIGH (ref 6.5–8.1)

## 2022-02-24 LAB — GLUCOSE, CAPILLARY
Glucose-Capillary: 133 mg/dL — ABNORMAL HIGH (ref 70–99)
Glucose-Capillary: 161 mg/dL — ABNORMAL HIGH (ref 70–99)
Glucose-Capillary: 130 mg/dL — ABNORMAL HIGH (ref 70–99)
Glucose-Capillary: 136 mg/dL — ABNORMAL HIGH (ref 70–99)

## 2022-02-24 LAB — CBC
HCT: 38.3 % (ref 36.0–46.0)
Hemoglobin: 10.8 g/dL — ABNORMAL LOW (ref 12.0–15.0)
MCH: 26 pg (ref 26.0–34.0)
MCHC: 28.2 g/dL — ABNORMAL LOW (ref 30.0–36.0)
MCV: 92.3 fL (ref 80.0–100.0)
Platelets: 219 10*3/uL (ref 150–400)
RBC: 4.15 MIL/uL (ref 3.87–5.11)
RDW: 20.7 % — ABNORMAL HIGH (ref 11.5–15.5)
WBC: 9.2 10*3/uL (ref 4.0–10.5)
nRBC: 0 % (ref 0.0–0.2)

## 2022-02-24 MED ORDER — POLYETHYLENE GLYCOL 3350 17 G PO PACK: 17.0000 g | PACK | Freq: Every day | ORAL | Status: DC | PRN

## 2022-02-24 NOTE — Progress Notes (Signed)
Patient stated that she could not wear her cpap tonight

## 2022-02-24 NOTE — Telephone Encounter (Signed)
Pt was admitted for cellulitis of her leg and had been receiving IV iron while she has been in the hospital.  She called inquiring about her GI referral and it does not look like one has been sent.  GI referral faxed to [facility] for evaluation of IDA faxed and confirmation received.

## 2022-02-24 NOTE — Progress Notes (Signed)
Progress Note   Patient: Alyssa Blanchard OPF:[id number] DOB: 11/22/1970 DOA: 02/16/2022     8 DOS: the patient was seen and examined on 02/24/2022   Brief hospital course: 51 year old F with PMH of combined CHF, morbid obesity, A-fib on Eliquis, possible OHS, prolonged QT, lumbar radiculopathy, BPD, fibromyalgia and iron deficiency anemia presenting with bilateral leg swelling,, weight gain, right thigh pain and recurrent falls at home, and admitted for right lower extremity cellulitis, and acute on chronic right lumbar radiculopathy.  She was started on IV Ancef.  MRI lumbar spine ordered but patient was not able to feel MRI due to morbid obesity.   Pain and cellulitis seems to have improved.  Therapy recommended SNF.  She is medically optimized for discharge pending SNF difficult to place she requires bariatric bed.  Assessment and Plan: Possible right leg cellulitis:  -It is unclear if the erythema is really cellulitis or intertrigo since it is mainly in skin flexures.   -Completed 5 days of IV Ancef. -Continue nystatin powders for possible intertrigo.  Received Diflucan on 12/14. -Leg elevation as able -Continue home diuretics as tolerated. Continues to void very well   Lumbar radiculopathy, right: Chronic issue.  She is already on Lyrica.  Not able to get lumbar MRI due to body habitus. -Continue Lyrica to 100 mg twice daily.  Takes once a day at home. -P.o. oxycodone 5 mg every 6 hours as needed -Change Toradol to as needed -PT/OT-recommended SNF, awaiting placement   Iron deficiency anemia: H&H seems to be better than baseline.  Anemia panel with significant iron deficiency. -Received IV ferric gluconate 250 mg daily x 2 -P.o. Protonix -cont to follow cbc trends   Controlled NIDDM-2: A1c 6.1% -Continue SSI-moderate -Okay to continue home Ozempic if she can bring from home -Holding home glipizide while in hospital.  Glipizide may cause weight gain. -Cont SSI as needed    Chronic combined CHF: TTE in 2021 with LVEF of 30 to 35%, GH and normal RV function.Marland Kitchen  Presents with BLE edema and weight gain but not able to elaborate how much she gained.  Difficult to assess fluid status.  No significant cardiopulmonary symptoms.  On home Bumex.  Blood pressure and creatinine stable. -Continue home Bumex and Aldactone. -Continue losartan 50 mg daily -Optimize electrolytes.  Increased p.o. KCl to 40 mg twice daily.  Patient is on Tikosyn. -Strict intake and output and daily weight   Paroxysmal A-fib/prolonged QT.  Rate controlled.  Not in RVR.  QTc 498.  She is on Tikosyn. -Continue telemetry monitoring -Continue Tikosyn, Inderal and Eliquis -Optimize electrolytes. -Continue p.o. KCl to 40 mEq twice daily.   Bipolar 1 disorder: stopped all psychiatric meds 2/2 side effects. Is not currently seeing psychiatrist or therapist. No imminent danger. -Resumed home BuSpar and Abilify. -Continue home Atarax. -TOC and PCP to establish with psychiatrist.    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  Continue nystatin powders   Gastroesophageal reflux disease -Continue PPI   Ambulatory dysfunction: Reports using walker at baseline. -PT/OT-recommended SNF.   Obesity hypoventilation syndrome/possible OSA -Needs sleep study. -Pt trying to get used to CPAP while in hospital   Eczema -Continue home triamcinolone and Atarax.   Vitamin D deficiency: Ruled out.  Vitamin D level 36.   Morbid obesity: Markedly elevated BMI and ambulatory dysfunction Body mass index is 94.72 kg/m. -Continue Ozempic.  -Address bipolar disorder      Subjective: Eager to work  with PT today  Physical Exam: Vitals:   02/24/22 0750 02/24/22 1022 02/24/22 1337 02/24/22 1604  BP:  117/64  (!) 109/47  Pulse:  72  68  Resp:    18  Temp:    98.5 F (36.9 C)  TempSrc:    Oral  SpO2: 90%   92%  Weight:   (!) 205.4  kg   Height:       General exam: Awake, sitting in chair Respiratory system: Normal respiratory effort, no wheezing Cardiovascular system: regular rate, s1, s2 Gastrointestinal system: Soft, nondistended, positive BS Central nervous system: CN2-12 grossly intact, strength intact Extremities: Perfused, no clubbing Skin: Normal skin turgor, no notable skin lesions seen Psychiatry: Mood normal // no visual hallucinations   Data Reviewed:  Labs reviewed: Na 136, K 4.5, Cr 0.89, Hgb 10.8   Family Communication: Pt in room, family at bedside  Disposition: Status is: Inpatient Remains inpatient appropriate because: d/c planning and severity of illness  Planned Discharge Destination: Skilled nursing facility    Author: Rickey Barbara, MD 02/24/2022 4:06 PM  For on call review [email or web address].

## 2022-02-24 NOTE — Progress Notes (Signed)
Physical Therapy Treatment Patient Details Name: Alyssa Blanchard MRN: [id number] DOB: [birth year] Today's Date: 02/24/2022   History of Present Illness Patient is a 51 year old female who presented after fall at home with weight gain and leg pain. Patient unable to go to MRI dur to body habitus. X-ray revealed moderate DJD of lumbar. Patient was admitted with cellulitis of R leg, anemia, and right lumbar radiculopathy. WUJ:[id number] disorder, HFrEF, a fib, obesity,    PT Comments    General Comments: AxO x 3 very pleasant Lady and willing.  "I have not been out of this bed in a week". Used SKY LIFT to assist pt OOB to recliner then attempted amb from recliner for safety.  Applied B shoes.  Pt happy "they fit again" as she reports B LE has "come down with the Lasix".  General bed mobility comments: pt able to roll side to side at Min Assist using rails to place SKY PAD under her to use lift to get OOB. General transfer comment: pt was able to self rise from [facility] using forward moment to weight shift. General Gait Details: pt was able to amb 5 feet forward + 2 asisst either side and a third following with recliner as a precaution.  Distance limited by fatigue and effort.  Mild anxiety/fear of falling. Positioned in recliner to comfort.  Pt asked, we "NOT leave her too long".  Agreed to return around 1:30 to walk again and assist back to bed. Pt will need ST Rehab at SNF to address mobility and functional decline prior to safely returning home.   Recommendations for follow up therapy are one component of a multi-disciplinary discharge planning process, led by the attending physician.  Recommendations may be updated based on patient status, additional functional criteria and insurance authorization.  Follow Up Recommendations  Skilled nursing-short term rehab (<3 hours/day) Can patient physically be transported by private vehicle: No   Assistance Recommended at Discharge Frequent or constant  Supervision/Assistance  Patient can return home with the following Two people to help with walking and/or transfers;Two people to help with bathing/dressing/bathroom;Assistance with cooking/housework;Assist for transportation;Help with stairs or ramp for entrance   Equipment Recommendations  None recommended by PT    Recommendations for Other Services       Precautions / Restrictions Precautions Precautions: Fall Restrictions Weight Bearing Restrictions: No     Mobility  Bed Mobility Overal bed mobility: Needs Assistance   Rolling: Min assist         General bed mobility comments: pt able to roll side to side at Min Assist using rails to place SKY PAD under her to use lift to get OOB.    Transfers Overall transfer level: Needs assistance Equipment used: Rolling walker (2 wheels) Transfers: Sit to/from Stand Sit to Stand: Min guard, +2 physical assistance, +2 safety/equipment           General transfer comment: pt was able to self rise from [place] recliner using forward moment to weight shift.    Ambulation/Gait Ambulation/Gait assistance: Min assist, +2 physical assistance, +2 safety/equipment Gait Distance (Feet): 5 Feet Assistive device: Rolling walker (2 wheels) (Bari walker) Gait Pattern/deviations: Step-to pattern, Wide base of support, Decreased step length - left, Decreased step length - right Gait velocity: decreased     General Gait Details: pt was able to amb 5 feet forward + 2 asisst either side and a third following with recliner as a precaution.  Distance limited by fatigue and effort.  Mild  anxiety/fear of falling.   Stairs             Wheelchair Mobility    Modified Rankin (Stroke Patients Only)       Balance                                            Cognition Arousal/Alertness: Awake/alert Behavior During Therapy: WFL for tasks assessed/performed Overall Cognitive Status: Within Functional Limits for tasks  assessed                                 General Comments: AxO x 3 very pleasant Lady and willing.  "I have not been out of this bed in a week".        Exercises      General Comments        Pertinent Vitals/Pain Pain Assessment Pain Assessment: Faces Faces Pain Scale: Hurts little more Pain Location: Right Thigh Pain Descriptors / Indicators: Grimacing, Aching, Tender Pain Intervention(s): Monitored during session, Premedicated before session, Repositioned    Home Living                          Prior Function            PT Goals (current goals can now be found in the care plan section) Progress towards PT goals: Progressing toward goals    Frequency    Min 3X/week      PT Plan Current plan remains appropriate    Co-evaluation              AM-PAC PT "6 Clicks" Mobility   Outcome Measure  Help needed turning from your back to your side while in a flat bed without using bedrails?: A Lot Help needed moving from lying on your back to sitting on the side of a flat bed without using bedrails?: A Lot Help needed moving to and from a bed to a chair (including a wheelchair)?: A Lot Help needed standing up from a chair using your arms (e.g., wheelchair or bedside chair)?: A Lot Help needed to walk in hospital room?: A Lot Help needed climbing 3-5 steps with a railing? : Total 6 Click Score: 11    End of Session Equipment Utilized During Treatment: Oxygen Activity Tolerance: Patient tolerated treatment well Patient left: in chair;with call bell/phone within reach Nurse Communication: Mobility status PT Visit Diagnosis: Unsteadiness on feet (R26.81);Muscle weakness (generalized) (M62.81);Difficulty in walking, not elsewhere classified (R26.2);History of falling (Z91.81);Other (comment)     Time: 1010-1040 PT Time Calculation (min) (ACUTE ONLY): 30 min  Charges:  $Gait Training: 8-22 mins $Therapeutic Activity: 8-22 mins                      Rica Koyanagi  PTA [place] Office M-F          [phone] Weekend pager [phone]

## 2022-02-24 NOTE — Plan of Care (Signed)
Problem: Education: Goal: Knowledge of General Education information will improve Description: Including pain rating scale, medication(s)/side effects and non-pharmacologic comfort measures Outcome: Progressing   Problem: Clinical Measurements: Goal: Ability to maintain clinical measurements within normal limits will improve Outcome: Progressing   Problem: Activity: Goal: Risk for activity intolerance will decrease Outcome: Progressing   Problem: Pain Managment: Goal: General experience of comfort will improve Outcome: Progressing   Haydee Salter, RN 02/24/22 9:17 AM

## 2022-02-24 NOTE — Progress Notes (Signed)
Physical Therapy Treatment Patient Details Name: Alyssa Blanchard MRN: [id number] DOB: [birth year] Today's Date: 02/24/2022   History of Present Illness Patient is a 51 year old female who presented after fall at home with weight gain and leg pain. Patient unable to go to MRI dur to body habitus. X-ray revealed moderate DJD of lumbar. Patient was admitted with cellulitis of R leg, anemia, and right lumbar radiculopathy. [id number] disorder, HFrEF, a fib, obesity,    PT Comments    General Comments: AxO x 3 very pleasant Lady and willing.  tolerated sitting in recliner from 10:30 am till 1:15 pm. Pt c/o that the [place] bed is "hard as a rock" and that "it hurt my back all night long".  So bed was removed and replaced with a regular bed. Pt stated, "that's so much better.  My feet can tough the floor" (while seated EOB)  RN in room during transfer.  Pt was able to rise from recliner and amb 6 feet back to bed.  General bed mobility comments: pt able to lift B LE up onto bed with 50% assist "heavy".  Pt was more able to roll and readjust self.  Pt was also able to perform a partial "bridge" as we straighten out the bed pad to remove wrinkles.  Pt excited to have been OOB and walk today.  Pt progressing but still has a way to go.  Pt will need ST Rehab at SNF to address mobility and functional decline prior to safely returning home.   Recommendations for follow up therapy are one component of a multi-disciplinary discharge planning process, led by the attending physician.  Recommendations may be updated based on patient status, additional functional criteria and insurance authorization.  Follow Up Recommendations  Skilled nursing-short term rehab (<3 hours/day) Can patient physically be transported by private vehicle: No   Assistance Recommended at Discharge Frequent or constant Supervision/Assistance  Patient can return home with the following Two people to help with walking and/or transfers;Two  people to help with bathing/dressing/bathroom;Assistance with cooking/housework;Assist for transportation;Help with stairs or ramp for entrance   Equipment Recommendations  None recommended by PT    Recommendations for Other Services       Precautions / Restrictions Precautions Precautions: Fall Restrictions Weight Bearing Restrictions: No     Mobility  Bed Mobility Overal bed mobility: Needs Assistance Bed Mobility: Sit to Supine Rolling: Min assist     Sit to supine: HOB elevated, Mod assist   General bed mobility comments: pt able to lift B LE up onto bed with 50% assist "heavy"    Transfers Overall transfer level: Needs assistance Equipment used: Rollator (4 wheels) Transfers: Sit to/from Stand Sit to Stand: Supervision, Min guard           General transfer comment: pt able to rise from recliner level by shifting her body weight forward.  "So much easier than from that bed" (Bari bed)    Ambulation/Gait Ambulation/Gait assistance: Herbalist (Feet): 6 Feet Assistive device: Rolling walker (2 wheels) Gait Pattern/deviations: Step-to pattern, Wide base of support, Decreased step length - left, Decreased step length - right Gait velocity: decreased     General Gait Details: pt was able to amb from recliner to bed approx 6 feet using Bare walker.   Stairs             Wheelchair Mobility    Modified Rankin (Stroke Patients Only)       Balance  Cognition Arousal/Alertness: Awake/alert Behavior During Therapy: WFL for tasks assessed/performed Overall Cognitive Status: Within Functional Limits for tasks assessed                                 General Comments: AxO x 3 very pleasant Lady and willing.  tolerated sitting in recliner from 10:30 am till 1:15 pm.        Exercises      General Comments        Pertinent Vitals/Pain Pain Assessment Pain  Assessment: Faces Faces Pain Scale: Hurts little more Pain Location: Right Thigh Pain Descriptors / Indicators: Grimacing, Aching, Tender Pain Intervention(s): Monitored during session, Repositioned    Home Living                          Prior Function            PT Goals (current goals can now be found in the care plan section) Progress towards PT goals: Progressing toward goals    Frequency           PT Plan Current plan remains appropriate    Co-evaluation              AM-PAC PT "6 Clicks" Mobility   Outcome Measure  Help needed turning from your back to your side while in a flat bed without using bedrails?: A Lot Help needed moving from lying on your back to sitting on the side of a flat bed without using bedrails?: A Lot Help needed moving to and from a bed to a chair (including a wheelchair)?: A Lot Help needed standing up from a chair using your arms (e.g., wheelchair or bedside chair)?: A Lot Help needed to walk in hospital room?: A Lot Help needed climbing 3-5 steps with a railing? : Total 6 Click Score: 11    End of Session Equipment Utilized During Treatment: Gait belt Activity Tolerance: Patient tolerated treatment well Patient left: in bed;with call bell/phone within reach;with bed alarm set Nurse Communication: Mobility status PT Visit Diagnosis: Unsteadiness on feet (R26.81);Muscle weakness (generalized) (M62.81);Difficulty in walking, not elsewhere classified (R26.2);History of falling (Z91.81);Other (comment)     Time: 4481-8563 PT Time Calculation (min) (ACUTE ONLY): 28 min  Charges:  $Gait Training: 8-22 mins $Therapeutic Activity: 8-22 mins                     Felecia Shelling  PTA Acute  Rehabilitation Services Office M-F          [phone] Weekend pager [phone]

## 2022-02-25 DIAGNOSIS — L03115 Cellulitis of right lower limb: Secondary | ICD-10-CM | POA: Diagnosis not present

## 2022-02-25 LAB — GLUCOSE, CAPILLARY
Glucose-Capillary: 134 mg/dL — ABNORMAL HIGH (ref 70–99)
Glucose-Capillary: 183 mg/dL — ABNORMAL HIGH (ref 70–99)
Glucose-Capillary: 112 mg/dL — ABNORMAL HIGH (ref 70–99)
Glucose-Capillary: 171 mg/dL — ABNORMAL HIGH (ref 70–99)

## 2022-02-25 LAB — COMPREHENSIVE METABOLIC PANEL
ALT: 13 U/L (ref 0–44)
AST: 39 U/L (ref 15–41)
Albumin: 3.4 g/dL — ABNORMAL LOW (ref 3.5–5.0)
Alkaline Phosphatase: 59 U/L (ref 38–126)
Anion gap: 9 (ref 5–15)
BUN: 14 mg/dL (ref 6–20)
CO2: 30 mmol/L (ref 22–32)
Calcium: 9.1 mg/dL (ref 8.9–10.3)
Chloride: 97 mmol/L — ABNORMAL LOW (ref 98–111)
Creatinine, Ser: 0.92 mg/dL (ref 0.44–1.00)
GFR, Estimated: >60 mL/min (ref >60)
Glucose, Bld: 129 mg/dL — ABNORMAL HIGH (ref 70–99)
Potassium: 4.1 mmol/L (ref 3.5–5.1)
Sodium: 136 mmol/L (ref 135–145)
Total Bilirubin: 0.5 mg/dL (ref 0.3–1.2)
Total Protein: 7.4 g/dL (ref 6.5–8.1)

## 2022-02-25 NOTE — Plan of Care (Signed)
  Problem: Education: Goal: Knowledge of General Education information will improve Description: Including pain rating scale, medication(s)/side effects and non-pharmacologic comfort measures Outcome: Progressing   Problem: Nutrition: Goal: Adequate nutrition will be maintained Outcome: Progressing   

## 2022-02-25 NOTE — Progress Notes (Signed)
Progress Note   Patient: Alyssa Blanchard UYQ:[id number] DOB: 08-31-1970 DOA: 02/16/2022     9 DOS: the patient was seen and examined on 02/25/2022   Brief hospital course: 51 year old F with PMH of combined CHF, morbid obesity, A-fib on Eliquis, possible OHS, prolonged QT, lumbar radiculopathy, BPD, fibromyalgia and iron deficiency anemia presenting with bilateral leg swelling,, weight gain, right thigh pain and recurrent falls at home, and admitted for right lower extremity cellulitis, and acute on chronic right lumbar radiculopathy.  She was started on IV Ancef.  MRI lumbar spine ordered but patient was not able to feel MRI due to morbid obesity.   Pain and cellulitis seems to have improved.  Therapy recommended SNF.  She is medically optimized for discharge pending SNF difficult to place she requires bariatric bed.  Assessment and Plan: Possible right leg cellulitis:  -It is unclear if the erythema is really cellulitis or intertrigo since it is mainly in skin flexures.   -Completed 5 days of IV Ancef. -Continue nystatin powders for possible intertrigo.  Received Diflucan on 12/14. -Leg elevation as able -Continue home diuretics as tolerated. Continues to void well   Lumbar radiculopathy, right: Chronic issue.  She is already on Lyrica.  Not able to get lumbar MRI due to body habitus. -Continue Lyrica to 100 mg twice daily.  Takes once a day at home. -P.o. oxycodone 5 mg every 6 hours as needed -Change Toradol to as needed -PT/OT-recommended SNF, awaiting placement   Iron deficiency anemia: H&H seems to be better than baseline.  Anemia panel with significant iron deficiency. -Received IV ferric gluconate 250 mg daily x 2 -P.o. Protonix -cont to follow cbc trends   Controlled NIDDM-2: A1c 6.1% -Continue SSI-moderate -Okay to continue home Ozempic if she can bring from home -Holding home glipizide while in hospital.  Glipizide may cause weight gain. -Cont SSI as needed   Chronic  combined CHF: TTE in 2021 with LVEF of 30 to 35%, GH and normal RV function.Marland Kitchen  Presents with BLE edema and weight gain but not able to elaborate how much she gained.  Difficult to assess fluid status.  No significant cardiopulmonary symptoms.  On home Bumex.  Blood pressure and creatinine stable. -Continue home Bumex and Aldactone. -Continue losartan 50 mg daily -Optimize electrolytes.  Increased p.o. KCl to 40 mg twice daily.  Patient is on Tikosyn. -Strict intake and output and daily weight -Continuing to void well   Paroxysmal A-fib/prolonged QT.  Rate controlled.  Not in RVR.  QTc 498.  She is on Tikosyn. -Continue telemetry monitoring -Continue Tikosyn, Inderal and Eliquis -Optimize electrolytes. -Continue p.o. KCl to 40 mEq twice daily.   Bipolar 1 disorder: stopped all psychiatric meds 2/2 side effects. Is not currently seeing psychiatrist or therapist. No imminent danger. -Resumed home BuSpar and Abilify. -Continue home Atarax. -TOC and PCP to establish with psychiatrist.    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  Continue nystatin powders   Gastroesophageal reflux disease -Continue PPI   Ambulatory dysfunction: Reports using walker at baseline. -PT/OT-recommended SNF.   Obesity hypoventilation syndrome/possible OSA -Needs sleep study. -Pt trying to get used to CPAP while in hospital   Eczema -Continue home triamcinolone and Atarax.   Vitamin D deficiency: Ruled out.  Vitamin D level 36.   Morbid obesity: Markedly elevated BMI and ambulatory dysfunction Body mass index is 94.72 kg/m. -Continue Ozempic.  -Address bipolar disorder      Subjective:  Voiding well. States feeling stronger today  Physical Exam: Vitals:   02/24/22 1604 02/24/22 2223 02/24/22 2348 02/25/22 1354  BP: (!) 109/47 117/63  (!) 102/50  Pulse: 68 75 71 72  Resp: 18 18 16 18   Temp: 98.5 F (36.9 C) 98.3 F  (36.8 C)  97.6 F (36.4 C)  TempSrc: Oral Oral  Oral  SpO2: 92% 93% 96% 99%  Weight:      Height:       General exam: Conversant, in no acute distress Respiratory system: normal chest rise, clear, no audible wheezing Cardiovascular system: regular rhythm, s1-s2 Gastrointestinal system: Nondistended, nontender, pos BS Central nervous system: No seizures, no tremors Extremities: No cyanosis, no joint deformities Skin: No rashes, no pallor Psychiatry: Affect normal // no auditory hallucinations   Data Reviewed:  Labs reviewed: Na 136, K 4.1, Cr 0.92   Family Communication: Pt in room, family not at bedside  Disposition: Status is: Inpatient Remains inpatient appropriate because: d/c planning and severity of illness  Planned Discharge Destination: Skilled nursing facility    Author: , MD 02/25/2022 2:33 PM  For on call review [email or web address].

## 2022-02-25 NOTE — Progress Notes (Signed)
Telemetry back on per orders.

## 2022-02-25 NOTE — TOC Progression Note (Signed)
Transition of Care [facility]) - Progression Note   Patient Details  Name: Alyssa Blanchard MRN: [id number] Date of Birth: Feb 06, 1971  [facility] CM/SW Contact  Ewing Schlein, LCSW Phone Number: 02/25/2022, 11:34 AM  Clinical Narrative: Patient continues to have no SNF bed offers due to payor source (Medicaid without a monthly check) and bariatric bed needs.  Expected Discharge Plan: Skilled Nursing Facility Barriers to Discharge: SNF Pending bed offer, Continued Medical Work up  Expected Discharge Plan and Services Expected Discharge Plan: Skilled Nursing Facility In-house Referral: Clinical Social Work Post Acute Care Choice: Skilled Nursing Facility Living arrangements for the past 2 months: Single Family Home Expected Discharge Date: 02/19/22               DME Arranged: N/A DME Agency: NA  Social Determinants of Health (SDOH) Interventions Food Insecurity Interventions: Intervention Not Indicated Housing Interventions: Intervention Not Indicated Transportation Interventions: Intervention Not Indicated Utilities Interventions: Intervention Not Indicated  Readmission Risk Interventions     No data to display

## 2022-02-25 NOTE — Progress Notes (Signed)
Inpatient Rehab Admissions Coordinator:  ? ?Per therapy recommendations,  patient was screened for CIR candidacy by Jonea Bukowski, MS, CCC-SLP. At this time, Pt. Appears to be a a potential candidate for CIR. I will place   order for rehab consult per protocol for full assessment. Please contact me any with questions. ? ?Shriyan Arakawa, MS, CCC-SLP ?Rehab Admissions Coordinator  ?[phone] (celll) ?[phone] (office) ? ?

## 2022-02-25 NOTE — Progress Notes (Signed)
Physical Therapy Treatment Patient Details Name: Alyssa Blanchard MRN: [id number] DOB: Dec 27, 1970 Today's Date: 02/25/2022   History of Present Illness Patient is a 51 year old female who presented after fall at home with weight gain and leg pain. Patient unable to go to MRI dur to body habitus. X-ray revealed moderate DJD of lumbar. Patient was admitted with cellulitis of R leg, anemia, and right lumbar radiculopathy. JSE:GBTDVVO disorder, HFrEF, a fib, obesity,    PT Comments    General Comments: AxO x 3 very pleasant Lady and willing.  Lives at home with her Mother. Prior level was Indep.   Assisted OOB to [facility] then amb went well.  General bed mobility comments: pt self able to transition to [facility] increased time and use of rails.  Much improved bed mobility now that she is on a "refular" bed vs that Bari Bed. General transfer comment: pt able to rise from bed level by shifting her body weight forward.  Also asissted with a BSC transfer. General Gait Details: tolerated an increased distance of 35 feet (20 + 15 feet) one seated rest break.  Recliner following for safety. Pt would benefit from a more aggressive Rehab such as Inpt Rehab.  Will update and consult LPT.   Recommendations for follow up therapy are one component of a multi-disciplinary discharge planning process, led by the attending physician.  Recommendations may be updated based on patient status, additional functional criteria and insurance authorization.  Follow Up Recommendations  Acute inpatient rehab (3hours/day) (due to progress, now rec CIR) Can patient physically be transported by private vehicle: No   Assistance Recommended at Discharge Frequent or constant Supervision/Assistance  Patient can return home with the following Two people to help with walking and/or transfers;Two people to help with bathing/dressing/bathroom;Assistance with cooking/housework;Assist for transportation;Help with stairs or ramp for entrance    Equipment Recommendations  None recommended by PT    Recommendations for Other Services Rehab consult     Precautions / Restrictions Precautions Precautions: Fall Restrictions Weight Bearing Restrictions: No     Mobility  Bed Mobility Overal bed mobility: Needs Assistance Bed Mobility: Supine to Sit     Supine to sit: Min guard     General bed mobility comments: pt self able to transition to EOBwith increased time and use of rails.  Much improved bed mobility now that she is on a "refular" bed vs that Bari Bed.    Transfers Overall transfer level: Needs assistance Equipment used: Rolling walker (2 wheels) Transfers: Sit to/from Stand Sit to Stand: Supervision, Min guard           General transfer comment: pt able to rise from bed level by shifting her body weight forward.  Also asissted with a BSC transfer.    Ambulation/Gait Ambulation/Gait assistance: Min guard, Min assist Gait Distance (Feet): 35 Feet Assistive device: Rolling walker (2 wheels) Gait Pattern/deviations: Step-to pattern, Wide base of support, Decreased step length - left, Decreased step length - right Gait velocity: decreased     General Gait Details: tolerated an increased distance of 35 feet (20 + 15 feet) one seated rest break.  Recliner following for safety.   Stairs             Wheelchair Mobility    Modified Rankin (Stroke Patients Only)       Balance  Cognition Arousal/Alertness: Awake/alert Behavior During Therapy: WFL for tasks assessed/performed                                   General Comments: AxO x 3 very pleasant Lady and willing.  Lives at home with her Mother.        Exercises      General Comments        Pertinent Vitals/Pain Pain Assessment Pain Assessment: Faces Pain Location: Right Thigh "improving" Pain Descriptors / Indicators: Grimacing, Aching, Tender Pain  Intervention(s): Monitored during session, Repositioned    Home Living                          Prior Function            PT Goals (current goals can now be found in the care plan section) Progress towards PT goals: Progressing toward goals    Frequency    Min 3X/week      PT Plan Current plan remains appropriate    Co-evaluation              AM-PAC PT "6 Clicks" Mobility   Outcome Measure  Help needed turning from your back to your side while in a flat bed without using bedrails?: A Little Help needed moving from lying on your back to sitting on the side of a flat bed without using bedrails?: A Little Help needed moving to and from a bed to a chair (including a wheelchair)?: A Little Help needed standing up from a chair using your arms (e.g., wheelchair or bedside chair)?: A Little Help needed to walk in hospital room?: A Lot Help needed climbing 3-5 steps with a railing? : Total 6 Click Score: 15    End of Session Equipment Utilized During Treatment: Gait belt Activity Tolerance: Patient tolerated treatment well Patient left: in chair;with call bell/phone within reach;with chair alarm set Nurse Communication: Mobility status PT Visit Diagnosis: Unsteadiness on feet (R26.81);Muscle weakness (generalized) (M62.81);Difficulty in walking, not elsewhere classified (R26.2);History of falling (Z91.81);Other (comment)     Time: 1030-1055 PT Time Calculation (min) (ACUTE ONLY): 25 min  Charges:  $Gait Training: 8-22 mins $Therapeutic Activity: 8-22 mins                     Rica Koyanagi  PTA [place] [facility] M-F          [phone] Weekend pager [phone]

## 2022-02-25 NOTE — Plan of Care (Signed)

## 2022-02-26 DIAGNOSIS — L03115 Cellulitis of right lower limb: Secondary | ICD-10-CM | POA: Diagnosis not present

## 2022-02-26 LAB — COMPREHENSIVE METABOLIC PANEL
ALT: 16 U/L (ref 0–44)
AST: 42 U/L — ABNORMAL HIGH (ref 15–41)
Albumin: 3.4 g/dL — ABNORMAL LOW (ref 3.5–5.0)
Alkaline Phosphatase: 61 U/L (ref 38–126)
Anion gap: 11 (ref 5–15)
BUN: 14 mg/dL (ref 6–20)
CO2: 28 mmol/L (ref 22–32)
Calcium: 9.1 mg/dL (ref 8.9–10.3)
Chloride: 96 mmol/L — ABNORMAL LOW (ref 98–111)
Creatinine, Ser: 1.02 mg/dL — ABNORMAL HIGH (ref 0.44–1.00)
GFR, Estimated: >60 mL/min (ref >60)
Glucose, Bld: 149 mg/dL — ABNORMAL HIGH (ref 70–99)
Potassium: 4.4 mmol/L (ref 3.5–5.1)
Sodium: 135 mmol/L (ref 135–145)
Total Bilirubin: 0.8 mg/dL (ref 0.3–1.2)
Total Protein: 7.4 g/dL (ref 6.5–8.1)

## 2022-02-26 LAB — CBC
HCT: 34 % — ABNORMAL LOW (ref 36.0–46.0)
Hemoglobin: 9.7 g/dL — ABNORMAL LOW (ref 12.0–15.0)
MCH: 26.2 pg (ref 26.0–34.0)
MCHC: 28.5 g/dL — ABNORMAL LOW (ref 30.0–36.0)
MCV: 91.9 fL (ref 80.0–100.0)
Platelets: 205 10*3/uL (ref 150–400)
RBC: 3.7 MIL/uL — ABNORMAL LOW (ref 3.87–5.11)
RDW: 20.5 % — ABNORMAL HIGH (ref 11.5–15.5)
WBC: 8.2 10*3/uL (ref 4.0–10.5)
nRBC: 0 % (ref 0.0–0.2)

## 2022-02-26 LAB — GLUCOSE, CAPILLARY
Glucose-Capillary: 117 mg/dL — ABNORMAL HIGH (ref 70–99)
Glucose-Capillary: 140 mg/dL — ABNORMAL HIGH (ref 70–99)
Glucose-Capillary: 112 mg/dL — ABNORMAL HIGH (ref 70–99)
Glucose-Capillary: 131 mg/dL — ABNORMAL HIGH (ref 70–99)

## 2022-02-26 MED ORDER — LOSARTAN POTASSIUM 25 MG PO TABS
25.0000 mg | ORAL_TABLET | Freq: Every day | ORAL | Status: DC
  Administered 2022-02-27 – 2022-02-28 (×2): 25 mg via ORAL
  Filled 2022-02-26 (×2): qty 1

## 2022-02-26 NOTE — Progress Notes (Signed)
Occupational Therapy Treatment Patient Details Name: Alyssa Blanchard MRN: [id number] DOB: 1970-10-05 Today's Date: 02/26/2022   History of present illness Patient is a 51 year old female who presented after fall at home with weight gain and leg pain. Patient unable to go to MRI dur to body habitus. X-ray revealed moderate DJD of lumbar. Patient was admitted with cellulitis of R leg, anemia, and right lumbar radiculopathy. CBJ:SEGBTDV disorder, HFrEF, a fib, obesity,   OT comments  Pt progressing well towards acute OT goals. Initial OT goals met and upgraded to include more challenging basic ADLs such as tub transfers. Currently needs mod A with tub shower transfer, min A with LB ADLs; min guard for toilet transfers and functional mobility. D/c recommendation and OT goals updated as below.    Recommendations for follow up therapy are one component of a multi-disciplinary discharge planning process, led by the attending physician.  Recommendations may be updated based on patient status, additional functional criteria and insurance authorization.    Follow Up Recommendations  Acute inpatient rehab (3hours/day)     Assistance Recommended at Discharge Intermittent Supervision/Assistance  Patient can return home with the following  A little help with walking and/or transfers;A little help with bathing/dressing/bathroom;Assistance with cooking/housework;Assist for transportation;Help with stairs or ramp for entrance   Equipment Recommendations  None recommended by OT    Recommendations for Other Services Rehab consult    Precautions / Restrictions Precautions Precautions: Fall Restrictions Weight Bearing Restrictions: No       Mobility Bed Mobility Overal bed mobility: Needs Assistance Bed Mobility: Supine to Sit, Sit to Supine     Supine to sit: Min guard Sit to supine: Min guard, HOB elevated   General bed mobility comments: pt utilized momentum and bed rails. no physical  assist needed this session though pt endorses she has been needing help with this as the day progress since her admission.    Transfers Overall transfer level: Needs assistance Equipment used: Rolling walker (2 wheels) Transfers: Sit to/from Stand Sit to Stand: Supervision, Min guard           General transfer comment: to/from EOB and recliner. min guard for safety. no physical assist     Balance Overall balance assessment: Needs assistance Sitting-balance support: Feet supported Sitting balance-Leahy Scale: Good     Standing balance support: Reliant on assistive device for balance, During functional activity, Bilateral upper extremity supported Standing balance-Leahy Scale: Poor                             ADL either performed or assessed with clinical judgement   ADL Overall ADL's : Needs assistance/impaired                                 Tub/ Shower Transfer: Moderate assistance;Ambulation;Rolling walker (2 wheels);Shower seat   Functional mobility during ADLs: Min guard;Rolling walker (2 wheels) General ADL Comments: Pt reports she is concerned about being able to navigate stairs at home.    Extremity/Trunk Assessment Upper Extremity Assessment Upper Extremity Assessment: Overall WFL for tasks assessed   Lower Extremity Assessment Lower Extremity Assessment: Defer to PT evaluation        Vision       Perception     Praxis      Cognition Arousal/Alertness: Awake/alert Behavior During Therapy: WFL for tasks assessed/performed Overall Cognitive Status: Within Functional Limits for  tasks assessed                                          Exercises      Shoulder Instructions       General Comments      Pertinent Vitals/ Pain       Pain Assessment Pain Assessment: Faces Faces Pain Scale: Hurts little more Pain Location: LEs with mobility Pain Descriptors / Indicators: Discomfort, Grimacing Pain  Intervention(s): Monitored during session  Home Living                                          Prior Functioning/Environment              Frequency  Min 2X/week        Progress Toward Goals  OT Goals(current goals can now be found in the care plan section)  Progress towards OT goals: Progressing toward goals;Goals met and updated - see care plan  Acute Rehab OT Goals Patient Stated Goal: to get stronger OT Goal Formulation: With patient Time For Goal Achievement: 03/12/22 Potential to Achieve Goals: Fair ADL Goals Pt Will Perform Grooming: standing;with supervision;sitting Pt Will Perform Lower Body Bathing: with modified independence;sit to/from stand Pt Will Perform Lower Body Dressing: with modified independence;sit to/from stand Pt Will Transfer to Toilet: with modified independence;ambulating Pt Will Perform Tub/Shower Transfer: with modified independence;ambulating;rolling walker Pt/caregiver will Perform Home Exercise Program: Increased strength;Both right and left upper extremity;With theraband;Independently;With written HEP provided Additional ADL Goal #1: Pt will independently verbalize 3 energy conservation strategies for managing ADLs.  Plan Discharge plan needs to be updated    Co-evaluation                 AM-PAC OT "6 Clicks" Daily Activity     Outcome Measure   Help from another person eating meals?: None Help from another person taking care of personal grooming?: A Little Help from another person toileting, which includes using toliet, bedpan, or urinal?: A Little Help from another person bathing (including washing, rinsing, drying)?: A Little Help from another person to put on and taking off regular upper body clothing?: A Little Help from another person to put on and taking off regular lower body clothing?: A Little 6 Click Score: 19    End of Session Equipment Utilized During Treatment: Rolling walker (2 wheels)  OT  Visit Diagnosis: Unsteadiness on feet (R26.81);Other abnormalities of gait and mobility (R26.89);Muscle weakness (generalized) (M62.81);History of falling (Z91.81);Pain   Activity Tolerance Patient tolerated treatment well   Patient Left in bed;with call bell/phone within reach   Nurse Communication Other (comment) (Nurse present for most of session.)        Time: 0942-1005 OT Time Calculation (min): 23 min  Charges: OT General Charges $OT Visit: 1 Visit OT Treatments $Self Care/Home Management : 23-37 mins  Tyrone Schimke, OT Acute Rehabilitation Services Office: [phone]    Hortencia Pilar 02/26/2022, 10:25 AM

## 2022-02-26 NOTE — Progress Notes (Signed)
Progress Note   Patient: Alyssa Blanchard QTM:[id number] DOB: [birth year] DOA: 02/16/2022     10 DOS: the patient was seen and examined on 02/26/2022   Brief hospital course: 51 year old F with PMH of combined CHF, morbid obesity, A-fib on Eliquis, possible OHS, prolonged QT, lumbar radiculopathy, BPD, fibromyalgia and iron deficiency anemia presenting with bilateral leg swelling,, weight gain, right thigh pain and recurrent falls at home, and admitted for right lower extremity cellulitis, and acute on chronic right lumbar radiculopathy.  She was started on IV Ancef.  MRI lumbar spine ordered but patient was not able to feel MRI due to morbid obesity.   Pain and cellulitis seems to have improved.  Therapy recommended SNF.  She is medically optimized for discharge pending SNF difficult to place she requires bariatric bed.  Assessment and Plan: Possible right leg cellulitis:  -It is unclear if the erythema is really cellulitis or intertrigo since it is mainly in skin flexures.   -Completed 5 days of IV Ancef. -Continue nystatin powders for possible intertrigo.  Received Diflucan on 12/14. -Leg elevation as able -Continue bumex 2, Aldactone 25- Continues to void well   Lumbar radiculopathy, right: Chronic issue.   -continue lyrica 100 bid--Not able to get lumbar MRI due to body habitus. -P.o. oxycodone 5 mg every 6 hours as needed -PT/OT-recommended SNF, awaiting placement   Iron deficiency anemia: H&H seems to be better than baseline.  Anemia panel with significant iron deficiency. -Received IV ferric gluconate 250 mg daily x 2 -P.o. Protonix -cont to follow cbc trends   Controlled NIDDM-2: A1c 6.1% -Continue SSI-moderate--CBg overall below 180 -Okay to continue home Ozempic if she can bring from home -Holding home glipizide while in hospital.  Glipizide may cause weight gain. -Cont SSI as needed   Chronic combined CHF: TTE in 2021 with LVEF of 30 to 35%, GH and normal RV function..  had BLE edema and weight gain but not able to elaborate how much she gained.  Difficult to assess fluid status.  No significant cardiopulmonary symptoms.  On home Bumex.  Blood pressure and creatinine stable. -Change losartan to 25 12/20 -Strict intake and output and daily weight   Paroxysmal A-fib/prolonged QT.  Rate controlled.  Not in RVR.  QTc 498.  She is on Tikosyn. -Continue telemetry monitoring -Continue Tikosyn, 500 bid Inderal 80 tid and Eliquis -Optimize electrolytes. -Continue p.o. KCl to 40 mEq twice daily.   Bipolar 1 disorder: stopped all psychiatric meds 2/2 side effects. Is not currently seeing psychiatrist or therapist. No imminent danger. -Resumed home BuSpar 10 -Continue home Atarax. -TOC and PCP to establish with psychiatrist.    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  Continue nystatin powders   Gastroesophageal reflux disease -Continue PPI   Ambulatory dysfunction: Reports using walker at baseline. -PT/OT-recommended SNF.   Obesity hypoventilation syndrome/possible OSA -Needs sleep study. -Pt trying to get used to CPAP while in hospital   Eczema -Continue home triamcinolone and Atarax.   Vitamin D deficiency: Ruled out.  Vitamin D level 36.   Morbid obesity: Markedly elevated BMI and ambulatory dysfunction Body mass index is 94.72 kg/m. -Continue Ozempic.  -Address bipolar disorder      Subjective: overall well No distress no pain fever chills rigor  Physical Exam: Vitals:   02/25/22 2141 02/26/22 0612 02/26/22 0812 02/26/22 1333  BP: (!) 100/51 (!) 107/54  118/60  Pulse: 73 60  68  Resp: 17 17  18  Temp: 98.4 F (36.9 C) 98.4 F (36.9 C)  98.7 F (37.1 C)  TempSrc: Oral Oral  Oral  SpO2: 96% 92% 93% 98%  Weight:    (!) 201.6 kg  Height:       General exam: Conversant, in no acute distress Respiratory system: normal chest rise, clear, no audible  wheezing Cardiovascular system: regular rhythm, s1-s2 Gastrointestinal system: obese non tender  Data Reviewed:  Labs reviewed: Na 135, K 4.4, Cr 1.02  Family Communication: Pt in room, family not at bedside  Disposition: Status is: Inpatient Remains inpatient appropriate because: d/c planning and severity of illness  Planned Discharge Destination: Skilled nursing facility    Author: Rhetta Mura, MD 02/26/2022 4:29 PM  For on call review [email or web address].

## 2022-02-26 NOTE — Progress Notes (Signed)
Physical Therapy Treatment Patient Details Name: Alyssa Blanchard MRN: [id number] DOB: Jun 09, 1970 Today's Date: 02/26/2022   History of Present Illness Patient is a 51 year old female who presented after fall at home with weight gain and leg pain. Patient unable to go to MRI dur to body habitus. X-ray revealed moderate DJD of lumbar. Patient was admitted with cellulitis of R leg, anemia, and right lumbar radiculopathy. [id number] disorder, HFrEF, a fib, obesity,    PT Comments    Pt is AxO x 3 very pleasant and motivated.  Assisted OOB.  General bed mobility comments: increased time and heavy use of rails. General transfer comment: to/from EOB and recliner. min guard for safety. no physical assist.  General Gait Details: increased c/o B knee pain this afternoon.  Decreased amb distance 12 feet x 2 to and from bathroom with increased time and much lean on walker.  Pt would benefit from aggressive Rehab such as AIR prior to returning home.    Recommendations for follow up therapy are one component of a multi-disciplinary discharge planning process, led by the attending physician.  Recommendations may be updated based on patient status, additional functional criteria and insurance authorization.  Follow Up Recommendations  Acute inpatient rehab (3hours/day) Can patient physically be transported by private vehicle: No   Assistance Recommended at Discharge Frequent or constant Supervision/Assistance  Patient can return home with the following Two people to help with walking and/or transfers;Two people to help with bathing/dressing/bathroom;Assistance with cooking/housework;Assist for transportation;Help with stairs or ramp for entrance   Equipment Recommendations  None recommended by PT    Recommendations for Other Services Rehab consult     Precautions / Restrictions Precautions Precautions: Fall Restrictions Weight Bearing Restrictions: No     Mobility  Bed Mobility Overal bed  mobility: Needs Assistance Bed Mobility: Supine to Sit, Sit to Supine     Supine to sit: Min guard Sit to supine: Min guard, Min assist   General bed mobility comments: increased time and heavy use of rails.    Transfers Overall transfer level: Needs assistance Equipment used: Rolling walker (2 wheels) Transfers: Sit to/from Stand Sit to Stand: Supervision, Min guard           General transfer comment: to/from EOB and recliner. min guard for safety. no physical assist    Ambulation/Gait Ambulation/Gait assistance: Min guard, Min assist Gait Distance (Feet): 25 Feet Assistive device: Rolling walker (2 wheels) Gait Pattern/deviations: Step-to pattern, Wide base of support, Decreased step length - left, Decreased step length - right Gait velocity: decreased     General Gait Details: increased c/o B knee pain this afternoon.  Decreased amb distance 12 feet x 2 to and from bathroom with increased time and much lean on walker.   Stairs             Wheelchair Mobility    Modified Rankin (Stroke Patients Only)       Balance                                            Cognition Arousal/Alertness: Awake/alert Behavior During Therapy: WFL for tasks assessed/performed Overall Cognitive Status: Within Functional Limits for tasks assessed                                 General Comments:  AxO x 3 very pleasant Lady and willing.  Lives at home with her Mother.        Exercises      General Comments        Pertinent Vitals/Pain Pain Assessment Pain Assessment: Faces Faces Pain Scale: Hurts little more Pain Location: B knees Pain Descriptors / Indicators: Discomfort, Grimacing, Tender Pain Intervention(s): Monitored during session, Premedicated before session, Repositioned    Home Living                          Prior Function            PT Goals (current goals can now be found in the care plan section)  Progress towards PT goals: Progressing toward goals    Frequency    Min 3X/week      PT Plan Current plan remains appropriate    Co-evaluation              AM-PAC PT "6 Clicks" Mobility   Outcome Measure  Help needed turning from your back to your side while in a flat bed without using bedrails?: A Little Help needed moving from lying on your back to sitting on the side of a flat bed without using bedrails?: A Little Help needed moving to and from a bed to a chair (including a wheelchair)?: A Little Help needed standing up from a chair using your arms (e.g., wheelchair or bedside chair)?: A Little Help needed to walk in hospital room?: A Lot Help needed climbing 3-5 steps with a railing? : Total 6 Click Score: 15    End of Session Equipment Utilized During Treatment: Gait belt Activity Tolerance: Patient tolerated treatment well Patient left: in bed;with call bell/phone within reach Nurse Communication: Mobility status PT Visit Diagnosis: Unsteadiness on feet (R26.81);Muscle weakness (generalized) (M62.81);Difficulty in walking, not elsewhere classified (R26.2);History of falling (Z91.81);Other (comment)     Time: 4854-6270 PT Time Calculation (min) (ACUTE ONLY): 27 min  Charges:  $Gait Training: 8-22 mins $Therapeutic Activity: 8-22 mins                     Felecia Shelling  PTA Acute  Rehabilitation Services Office M-F          [phone] Weekend pager [phone]

## 2022-02-26 NOTE — Plan of Care (Signed)
  Problem: Education: Goal: Knowledge of General Education information will improve Description: Including pain rating scale, medication(s)/side effects and non-pharmacologic comfort measures Outcome: Progressing   Problem: Activity: Goal: Risk for activity intolerance will decrease Outcome: Progressing   Problem: Nutrition: Goal: Adequate nutrition will be maintained Outcome: Progressing   Problem: Coping: Goal: Level of anxiety will decrease Outcome: Progressing   

## 2022-02-26 NOTE — Progress Notes (Addendum)
Inpatient Rehab Admissions Coordinator:   Attempted to reach pt by phone to discuss CIR recommendations but no answer.  Will try again this afternoon.    Addendum: spoke to pt by phone and discussed CIR goals/expectations including goals of supervision to mod I.  Pt confirms living with her mother who can provide 24/7 supervision but no physical assist.  We reviewed need for prior auth and I will request that with [facility] medicaid.  Will continue to follow and update once determination from insurance received.   Estill Dooms, PT, DPT Admissions Coordinator [phone] 02/26/22  12:37 PM

## 2022-02-26 NOTE — Progress Notes (Signed)
Called by patient's RN and was informed that the patient spoke to her and indicated that she did not want to wear her CPAP tonight.

## 2022-02-27 DIAGNOSIS — L03115 Cellulitis of right lower limb: Secondary | ICD-10-CM | POA: Diagnosis not present

## 2022-02-27 LAB — GLUCOSE, CAPILLARY
Glucose-Capillary: 112 mg/dL — ABNORMAL HIGH (ref 70–99)
Glucose-Capillary: 163 mg/dL — ABNORMAL HIGH (ref 70–99)
Glucose-Capillary: 135 mg/dL — ABNORMAL HIGH (ref 70–99)
Glucose-Capillary: 138 mg/dL — ABNORMAL HIGH (ref 70–99)

## 2022-02-27 NOTE — Plan of Care (Signed)

## 2022-02-27 NOTE — Progress Notes (Signed)
Progress Note   Patient: Alyssa Blanchard JGG:[id number] DOB: [birth year] DOA: 02/16/2022     11 DOS: the patient was seen and examined on 02/27/2022   Brief hospital course: 51 year old F with PMH of combined CHF, morbid obesity, A-fib on Eliquis, possible OHS, prolonged QT, lumbar radiculopathy, BPD, fibromyalgia and iron deficiency anemia presenting with bilateral leg swelling,, weight gain, right thigh pain and recurrent falls at home, and admitted for right lower extremity cellulitis, and acute on chronic right lumbar radiculopathy.  She was started on IV Ancef.  MRI lumbar spine ordered but patient was not able to feel MRI due to morbid obesity.   Pain and cellulitis seems to have improved.  Therapy recommended SNF.  She is medically optimized for discharge pending SNF difficult to place she requires bariatric bed.  Assessment and Plan: Possible right leg cellulitis:  -Completed 5 days of IV Ancef. -Continue nystatin powders for possible intertrigo.  Received Diflucan on 12/14. -Leg elevation as able -Continue bumex 2, Aldactone 25- Continues to void well   Lumbar radiculopathy, right: Chronic issue.   -continue lyrica 100 bid--Not able to get lumbar MRI due to body habitus. -P.o. oxycodone 5 mg every 6 hours as needed -PT/OT-recommended SNF, awaiting placement   Iron deficiency anemia: H&H seems to be better than baseline.  Anemia panel with significant iron deficiency. -Received IV ferric gluconate 250 mg daily x 2 -P.o. Protonix -cont to follow cbc trends   Controlled NIDDM-2: A1c 6.1% -Continue SSI-moderate--CBG overall below 180 -continue home Ozempic if she can bring from home -Holding home glipizide while in hospital.  Glipizide may cause weight gain. -Cont SSI as needed   Chronic combined CHF: TTE in 2021 with LVEF of 30 to 35%, GH and normal RV function.. had BLE edema and weight gain but not able to elaborate how much she gained.  On home Bumex.  Blood pressure and  creatinine stable. -Change losartan to 25, 12/20 -Strict intake and output and daily weight   Paroxysmal A-fib/prolonged QT.  Rate controlled.  Not in RVR.  QTc 498.  She is on Tikosyn. -Continue telemetry monitoring -Continue Tikosyn, 500 bid Inderal 80 tid and Eliquis -Optimize electrolytes. -Continue p.o. KCl to 40 mEq twice daily.   Bipolar 1 disorder: stopped all psychiatric meds 2/2 side effects. Is not currently seeing psychiatrist or therapist. No imminent danger. -Resumed home BuSpar 10 -Continue home Atarax. -TOC and PCP to establish with psychiatrist.    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  Continue nystatin powders   Gastroesophageal reflux disease -Continue PPI   Ambulatory dysfunction: Reports using walker at baseline. -PT/OT-recommended SNF.   Obesity hypoventilation syndrome/possible OSA -Needs sleep study. -using CPAP while in hospital when able   Eczema -Continue home triamcinolone and Atarax.   Vitamin D deficiency: Ruled out.  Vitamin D level 36.   Morbid obesity: Markedly elevated BMI and ambulatory dysfunction Body mass index is 94.72 kg/m. -Continue Ozempic.  -Address bipolar disorder      Subjective:  Awake coherent in nad no focal defciit Sitting in chair No fever no chills no n/v  Physical Exam: Vitals:   02/26/22 1333 02/26/22 2202 02/27/22 0533 02/27/22 1432  BP: 118/60 (!) 102/52 (!) 116/47 (!) 113/54  Pulse: 68 67 65 69  Resp: 18 17 17 16   Temp: 98.7 F (37.1 C) 98.5 F (36.9 C) 97.8 F (36.6 C) 98.1 F (36.7 C)  TempSrc: Oral Oral Oral   SpO2:  98% 95% 95% 99%  Weight: (!) 201.6 kg     Height:       General exam: Conversant, in no acute distress-no LAN no thyromegaly Respiratory system: normal chest rise, clear, no audible wheezing Cardiovascular system: regular rhythm, s1-s2 Gastrointestinal system: obese non tender MSK-Lower  extremities are swollen  Data Reviewed:  Labs reviewed:   Family Communication: Pt in room, family not at bedside  Disposition: Status is: Inpatient Remains inpatient appropriate because: d/c planning and severity of illness  Planned Discharge Destination: Skilled nursing facility    Author: Rhetta Mura, MD 02/27/2022 3:43 PM  For on call review [email or web address].

## 2022-02-27 NOTE — Progress Notes (Signed)
Physical Therapy Treatment Patient Details Name: Alyssa Blanchard MRN: [id number] DOB: [birth year] Today's Date: 02/27/2022   History of Present Illness Patient is a 51 year old female who presented after fall at home with weight gain and leg pain. Patient unable to go to MRI dur to body habitus. X-ray revealed moderate DJD of lumbar. Patient was admitted with cellulitis of R leg, anemia, and right lumbar radiculopathy. [id number] disorder, HFrEF, a fib, obesity,    PT Comments    Pt progressing but slowly.  Assisted OOB to amb to bathroom.  General Gait Details: increased c/o B knee pain and stiffness.  Decreased amb distance 12 feet x 2 to and from bathroom with increased time and much lean on walker. Required assist for hygiene and increased time due to weakness/fatigue.   Pt will need aggressive Rehab such as Inpt Rehab to regain her prior level of Indep.    Recommendations for follow up therapy are one component of a multi-disciplinary discharge planning process, led by the attending physician.  Recommendations may be updated based on patient status, additional functional criteria and insurance authorization.  Follow Up Recommendations  Acute inpatient rehab (3hours/day) Can patient physically be transported by private vehicle: No   Assistance Recommended at Discharge Frequent or constant Supervision/Assistance  Patient can return home with the following Two people to help with walking and/or transfers;Two people to help with bathing/dressing/bathroom;Assistance with cooking/housework;Assist for transportation;Help with stairs or ramp for entrance   Equipment Recommendations  None recommended by PT    Recommendations for Other Services Rehab consult     Precautions / Restrictions Precautions Precautions: Fall Restrictions Weight Bearing Restrictions: No     Mobility  Bed Mobility Overal bed mobility: Needs Assistance Bed Mobility: Supine to Sit     Supine to sit: Min  guard     General bed mobility comments: increased time and heavy use of rails.  difficulty self scooting.    Transfers Overall transfer level: Needs assistance Equipment used: Rolling walker (2 wheels) Transfers: Sit to/from Stand Sit to Stand: Supervision, Min guard           General transfer comment: to/from EOB and elevated commode. min guard for safety. no physical assist but caution due to B knee pain    Ambulation/Gait   Gait Distance (Feet): 24 Feet (12 feet x 2 to and from bathroom) Assistive device: Rolling walker (2 wheels) Gait Pattern/deviations: Step-to pattern, Wide base of support, Decreased step length - left, Decreased step length - right Gait velocity: decreased     General Gait Details: increased c/o B knee pain and stiffness.  Decreased amb distance 12 feet x 2 to and from bathroom with increased time and much lean on walker.   Stairs             Wheelchair Mobility    Modified Rankin (Stroke Patients Only)       Balance                                            Cognition Arousal/Alertness: Awake/alert Behavior During Therapy: WFL for tasks assessed/performed Overall Cognitive Status: Within Functional Limits for tasks assessed                                 General Comments: AxO x 3 very pleasant  Lady and willing.  Lives at home with her Mother.        Exercises      General Comments        Pertinent Vitals/Pain Pain Assessment Pain Assessment: Faces Faces Pain Scale: Hurts a little bit Pain Location: B knees Pain Descriptors / Indicators: Discomfort, Grimacing, Aching, Constant Pain Intervention(s): Monitored during session, Repositioned    Home Living                          Prior Function            PT Goals (current goals can now be found in the care plan section) Progress towards PT goals: Progressing toward goals    Frequency    Min 3X/week      PT Plan  Current plan remains appropriate    Co-evaluation              AM-PAC PT "6 Clicks" Mobility   Outcome Measure  Help needed turning from your back to your side while in a flat bed without using bedrails?: A Little Help needed moving from lying on your back to sitting on the side of a flat bed without using bedrails?: A Little Help needed moving to and from a bed to a chair (including a wheelchair)?: A Little Help needed standing up from a chair using your arms (e.g., wheelchair or bedside chair)?: A Little Help needed to walk in hospital room?: A Lot   6 Click Score: 14    End of Session Equipment Utilized During Treatment: Gait belt Activity Tolerance: Patient tolerated treatment well Patient left: in chair;with call bell/phone within reach;with chair alarm set Nurse Communication: Mobility status PT Visit Diagnosis: Unsteadiness on feet (R26.81);Muscle weakness (generalized) (M62.81);Difficulty in walking, not elsewhere classified (R26.2);History of falling (Z91.81);Other (comment)     Time: 7893-8101 PT Time Calculation (min) (ACUTE ONLY): 39 min  Charges:  $Gait Training: 8-22 mins $Therapeutic Activity: 23-37 mins                    Felecia Shelling  PTA Acute  Rehabilitation Services Office M-F          [phone] Weekend pager [phone]

## 2022-02-27 NOTE — Progress Notes (Signed)
Inpatient Rehab Admissions Coordinator:    Awaiting determination from [facility] Medicaid regarding CIR prior auth request.  Estill Dooms, PT, DPT Admissions Coordinator [phone] 02/27/22  10:17 AM

## 2022-02-28 ENCOUNTER — Encounter (HOSPITAL_COMMUNITY): Payer: Self-pay | Admitting: Physical Medicine & Rehabilitation

## 2022-02-28 ENCOUNTER — Inpatient Hospital Stay (HOSPITAL_COMMUNITY)
Admission: RE | Admit: 2022-02-28 | Discharge: 2022-03-11 | DRG: 945 | Disposition: A | Discharge status: Home / Self Care | Payer: Medicaid Other | Source: Other Acute Inpatient Hospital | Attending: Physical Medicine & Rehabilitation | Admitting: Physical Medicine & Rehabilitation

## 2022-02-28 ENCOUNTER — Other Ambulatory Visit: Payer: Self-pay

## 2022-02-28 DIAGNOSIS — Z91040 Latex allergy status: Secondary | ICD-10-CM

## 2022-02-28 DIAGNOSIS — M5416 Radiculopathy, lumbar region: Secondary | ICD-10-CM | POA: Diagnosis not present

## 2022-02-28 DIAGNOSIS — Z91148 Patient's other noncompliance with medication regimen for other reason: Secondary | ICD-10-CM

## 2022-02-28 DIAGNOSIS — K219 Gastro-esophageal reflux disease without esophagitis: Secondary | ICD-10-CM | POA: Diagnosis present

## 2022-02-28 DIAGNOSIS — N179 Acute kidney failure, unspecified: Secondary | ICD-10-CM | POA: Diagnosis present

## 2022-02-28 DIAGNOSIS — G4700 Insomnia, unspecified: Secondary | ICD-10-CM | POA: Diagnosis present

## 2022-02-28 DIAGNOSIS — Z7901 Long term (current) use of anticoagulants: Secondary | ICD-10-CM

## 2022-02-28 DIAGNOSIS — I4891 Unspecified atrial fibrillation: Secondary | ICD-10-CM | POA: Diagnosis present

## 2022-02-28 DIAGNOSIS — I959 Hypotension, unspecified: Secondary | ICD-10-CM | POA: Diagnosis present

## 2022-02-28 DIAGNOSIS — Z87891 Personal history of nicotine dependence: Secondary | ICD-10-CM | POA: Diagnosis not present

## 2022-02-28 DIAGNOSIS — Z91119 Patient's noncompliance with dietary regimen due to unspecified reason: Secondary | ICD-10-CM

## 2022-02-28 DIAGNOSIS — Z9884 Bariatric surgery status: Secondary | ICD-10-CM

## 2022-02-28 DIAGNOSIS — E119 Type 2 diabetes mellitus without complications: Secondary | ICD-10-CM | POA: Diagnosis present

## 2022-02-28 DIAGNOSIS — Z888 Allergy status to other drugs, medicaments and biological substances status: Secondary | ICD-10-CM

## 2022-02-28 DIAGNOSIS — K5900 Constipation, unspecified: Secondary | ICD-10-CM | POA: Diagnosis present

## 2022-02-28 DIAGNOSIS — Z808 Family history of malignant neoplasm of other organs or systems: Secondary | ICD-10-CM

## 2022-02-28 DIAGNOSIS — L739 Follicular disorder, unspecified: Secondary | ICD-10-CM | POA: Diagnosis present

## 2022-02-28 DIAGNOSIS — F419 Anxiety disorder, unspecified: Secondary | ICD-10-CM | POA: Diagnosis present

## 2022-02-28 DIAGNOSIS — F319 Bipolar disorder, unspecified: Secondary | ICD-10-CM | POA: Diagnosis present

## 2022-02-28 DIAGNOSIS — Z833 Family history of diabetes mellitus: Secondary | ICD-10-CM | POA: Diagnosis not present

## 2022-02-28 DIAGNOSIS — R110 Nausea: Secondary | ICD-10-CM | POA: Diagnosis present

## 2022-02-28 DIAGNOSIS — Z820 Family history of epilepsy and other diseases of the nervous system: Secondary | ICD-10-CM

## 2022-02-28 DIAGNOSIS — Z6841 Body Mass Index (BMI) 40.0 and over, adult: Secondary | ICD-10-CM

## 2022-02-28 DIAGNOSIS — M797 Fibromyalgia: Secondary | ICD-10-CM | POA: Diagnosis present

## 2022-02-28 DIAGNOSIS — Z79899 Other long term (current) drug therapy: Secondary | ICD-10-CM | POA: Diagnosis not present

## 2022-02-28 DIAGNOSIS — I480 Paroxysmal atrial fibrillation: Secondary | ICD-10-CM | POA: Diagnosis present

## 2022-02-28 DIAGNOSIS — Z841 Family history of disorders of kidney and ureter: Secondary | ICD-10-CM

## 2022-02-28 DIAGNOSIS — Z7985 Long-term (current) use of injectable non-insulin antidiabetic drugs: Secondary | ICD-10-CM

## 2022-02-28 DIAGNOSIS — L309 Dermatitis, unspecified: Secondary | ICD-10-CM | POA: Diagnosis not present

## 2022-02-28 DIAGNOSIS — F3174 Bipolar disorder, in full remission, most recent episode manic: Secondary | ICD-10-CM | POA: Diagnosis not present

## 2022-02-28 DIAGNOSIS — Z83438 Family history of other disorder of lipoprotein metabolism and other lipidemia: Secondary | ICD-10-CM

## 2022-02-28 DIAGNOSIS — R5381 Other malaise: Principal | ICD-10-CM | POA: Diagnosis present

## 2022-02-28 DIAGNOSIS — Z7951 Long term (current) use of inhaled steroids: Secondary | ICD-10-CM

## 2022-02-28 DIAGNOSIS — E1142 Type 2 diabetes mellitus with diabetic polyneuropathy: Secondary | ICD-10-CM | POA: Diagnosis not present

## 2022-02-28 DIAGNOSIS — Z7984 Long term (current) use of oral hypoglycemic drugs: Secondary | ICD-10-CM

## 2022-02-28 DIAGNOSIS — L03115 Cellulitis of right lower limb: Secondary | ICD-10-CM | POA: Diagnosis present

## 2022-02-28 DIAGNOSIS — E876 Hypokalemia: Secondary | ICD-10-CM | POA: Diagnosis present

## 2022-02-28 DIAGNOSIS — I5042 Chronic combined systolic (congestive) and diastolic (congestive) heart failure: Secondary | ICD-10-CM | POA: Diagnosis present

## 2022-02-28 DIAGNOSIS — Z91048 Other nonmedicinal substance allergy status: Secondary | ICD-10-CM

## 2022-02-28 DIAGNOSIS — Z8249 Family history of ischemic heart disease and other diseases of the circulatory system: Secondary | ICD-10-CM

## 2022-02-28 DIAGNOSIS — D509 Iron deficiency anemia, unspecified: Secondary | ICD-10-CM | POA: Diagnosis present

## 2022-02-28 DIAGNOSIS — J45909 Unspecified asthma, uncomplicated: Secondary | ICD-10-CM | POA: Diagnosis present

## 2022-02-28 LAB — GLUCOSE, CAPILLARY
Glucose-Capillary: 107 mg/dL — ABNORMAL HIGH (ref 70–99)
Glucose-Capillary: 140 mg/dL — ABNORMAL HIGH (ref 70–99)
Glucose-Capillary: 132 mg/dL — ABNORMAL HIGH (ref 70–99)
Glucose-Capillary: 141 mg/dL — ABNORMAL HIGH (ref 70–99)

## 2022-02-28 LAB — BASIC METABOLIC PANEL
Anion gap: 10 (ref 5–15)
BUN: 16 mg/dL (ref 6–20)
CO2: 27 mmol/L (ref 22–32)
Calcium: 9 mg/dL (ref 8.9–10.3)
Chloride: 95 mmol/L — ABNORMAL LOW (ref 98–111)
Creatinine, Ser: 1.05 mg/dL — ABNORMAL HIGH (ref 0.44–1.00)
GFR, Estimated: >60 mL/min (ref >60)
Glucose, Bld: 122 mg/dL — ABNORMAL HIGH (ref 70–99)
Potassium: 4.2 mmol/L (ref 3.5–5.1)
Sodium: 132 mmol/L — ABNORMAL LOW (ref 135–145)

## 2022-02-28 MED ORDER — FLUTICASONE FUROATE-VILANTEROL 200-25 MCG/ACT IN AEPB
1.0000 | INHALATION_SPRAY | Freq: Every day | RESPIRATORY_TRACT | Status: DC
  Administered 2022-03-01 – 2022-03-11 (×11): 1 via RESPIRATORY_TRACT
  Filled 2022-02-28: qty 28

## 2022-02-28 MED ORDER — HYDROXYZINE HCL 25 MG PO TABS: 50.0000 mg | ORAL_TABLET | Freq: Three times a day (TID) | ORAL | Status: DC | PRN

## 2022-02-28 MED ORDER — NYSTATIN 100000 UNIT/GM EX POWD
Freq: Two times a day (BID) | CUTANEOUS | Status: DC
  Filled 2022-02-28: qty 15

## 2022-02-28 MED ORDER — LOSARTAN POTASSIUM 25 MG PO TABS: 25.0000 mg | ORAL_TABLET | Freq: Every day | ORAL | Status: DC

## 2022-02-28 MED ORDER — DOFETILIDE 500 MCG PO CAPS
500.0000 ug | ORAL_CAPSULE | Freq: Two times a day (BID) | ORAL | Status: DC
  Administered 2022-02-28 – 2022-03-11 (×22): 500 ug via ORAL
  Filled 2022-02-28 (×23): qty 1

## 2022-02-28 MED ORDER — LOSARTAN POTASSIUM 50 MG PO TABS
25.0000 mg | ORAL_TABLET | Freq: Every day | ORAL | Status: DC
  Administered 2022-03-01: 25 mg via ORAL
  Filled 2022-02-28: qty 1

## 2022-02-28 MED ORDER — SILVER SULFADIAZINE 1 % EX CREA
1.0000 | TOPICAL_CREAM | Freq: Every day | CUTANEOUS | Status: DC
  Administered 2022-02-28 – 2022-03-11 (×11): 1 via TOPICAL
  Filled 2022-02-28: qty 85

## 2022-02-28 MED ORDER — INSULIN ASPART 100 UNIT/ML IJ SOLN
0.0000 [IU] | Freq: Three times a day (TID) | INTRAMUSCULAR | Status: DC
  Administered 2022-02-28 – 2022-03-08 (×11): 2 [IU] via SUBCUTANEOUS
  Administered 2022-03-09: 3 [IU] via SUBCUTANEOUS
  Administered 2022-03-09: 5 [IU] via SUBCUTANEOUS
  Administered 2022-03-09: 3 [IU] via SUBCUTANEOUS
  Administered 2022-03-10 – 2022-03-11 (×3): 2 [IU] via SUBCUTANEOUS

## 2022-02-28 MED ORDER — PANTOPRAZOLE SODIUM 40 MG PO TBEC
40.0000 mg | DELAYED_RELEASE_TABLET | Freq: Every day | ORAL | Status: DC
  Administered 2022-03-01 – 2022-03-03 (×3): 40 mg via ORAL
  Filled 2022-02-28 (×3): qty 1

## 2022-02-28 MED ORDER — OXYCODONE HCL 5 MG PO TABS: 5.0000 mg | ORAL_TABLET | Freq: Four times a day (QID) | ORAL | 0 refills | Status: DC | PRN

## 2022-02-28 MED ORDER — SPIRONOLACTONE 25 MG PO TABS
25.0000 mg | ORAL_TABLET | Freq: Every day | ORAL | Status: DC
  Administered 2022-03-01 – 2022-03-11 (×11): 25 mg via ORAL
  Filled 2022-02-28 (×11): qty 1

## 2022-02-28 MED ORDER — POLYETHYLENE GLYCOL 3350 17 G PO PACK: 17.0000 g | PACK | Freq: Every day | ORAL | Status: DC | PRN

## 2022-02-28 MED ORDER — APIXABAN 5 MG PO TABS
5.0000 mg | ORAL_TABLET | Freq: Two times a day (BID) | ORAL | Status: DC
  Administered 2022-02-28 – 2022-03-11 (×22): 5 mg via ORAL
  Filled 2022-02-28 (×22): qty 1

## 2022-02-28 MED ORDER — ADVANCED MULTI EA PO CHEW
2.0000 | CHEWABLE_TABLET | Freq: Every day | ORAL | Status: DC
  Administered 2022-03-01 – 2022-03-03 (×3): 2 via ORAL
  Filled 2022-02-28 (×5): qty 2

## 2022-02-28 MED ORDER — TRIAMCINOLONE ACETONIDE 0.1 % EX CREA
1.0000 | TOPICAL_CREAM | Freq: Two times a day (BID) | CUTANEOUS | Status: DC
  Administered 2022-03-04 – 2022-03-10 (×3): 1 via TOPICAL
  Filled 2022-02-28: qty 15

## 2022-02-28 MED ORDER — APIXABAN 5 MG PO TABS: 5.0000 mg | ORAL_TABLET | Freq: Two times a day (BID) | ORAL | 0 refills | Status: DC

## 2022-02-28 MED ORDER — SORBITOL 70 % SOLN: 30.0000 mL | Freq: Every day | Status: DC | PRN

## 2022-02-28 MED ORDER — BUMETANIDE 2 MG PO TABS
2.0000 mg | ORAL_TABLET | Freq: Every day | ORAL | Status: DC
  Administered 2022-03-01 – 2022-03-05 (×5): 2 mg via ORAL
  Filled 2022-02-28 (×5): qty 1

## 2022-02-28 MED ORDER — PREGABALIN 50 MG PO CAPS
100.0000 mg | ORAL_CAPSULE | Freq: Two times a day (BID) | ORAL | Status: DC
  Administered 2022-02-28 – 2022-03-11 (×22): 100 mg via ORAL
  Filled 2022-02-28 (×22): qty 2

## 2022-02-28 MED ORDER — PROPRANOLOL HCL 20 MG PO TABS
80.0000 mg | ORAL_TABLET | Freq: Three times a day (TID) | ORAL | Status: DC
  Administered 2022-02-28 – 2022-03-07 (×20): 80 mg via ORAL
  Filled 2022-02-28 (×22): qty 4

## 2022-02-28 MED ORDER — MELATONIN 5 MG PO TABS
10.0000 mg | ORAL_TABLET | Freq: Every day | ORAL | Status: DC
  Administered 2022-02-28 – 2022-03-10 (×11): 10 mg via ORAL
  Filled 2022-02-28 (×13): qty 2

## 2022-02-28 MED ORDER — OXYCODONE HCL 5 MG PO TABS
5.0000 mg | ORAL_TABLET | Freq: Four times a day (QID) | ORAL | Status: DC | PRN
  Administered 2022-02-28 – 2022-03-01 (×4): 5 mg via ORAL
  Filled 2022-02-28 (×4): qty 1

## 2022-02-28 MED ORDER — POLYETHYLENE GLYCOL 3350 17 G PO PACK
17.0000 g | PACK | Freq: Every day | ORAL | Status: DC | PRN
  Administered 2022-03-01 – 2022-03-10 (×3): 17 g via ORAL
  Filled 2022-02-28 (×2): qty 1

## 2022-02-28 MED ORDER — BUSPIRONE HCL 10 MG PO TABS
10.0000 mg | ORAL_TABLET | Freq: Two times a day (BID) | ORAL | Status: DC
  Administered 2022-02-28 – 2022-03-11 (×22): 10 mg via ORAL
  Filled 2022-02-28 (×23): qty 1

## 2022-02-28 MED ORDER — ACETAMINOPHEN 325 MG PO TABS
325.0000 mg | ORAL_TABLET | ORAL | Status: DC | PRN
  Administered 2022-02-28 – 2022-03-11 (×36): 650 mg via ORAL
  Filled 2022-02-28 (×38): qty 2

## 2022-02-28 MED ORDER — SIMETHICONE 80 MG PO CHEW: 80.0000 mg | CHEWABLE_TABLET | Freq: Four times a day (QID) | ORAL | Status: DC | PRN

## 2022-02-28 NOTE — Discharge Summary (Signed)
Physician Discharge Summary  Alyssa Blanchard [id number] DOB: November 17, 1970 DOA: 02/16/2022  PCP: Doreatha Lew, MD  Admit date: 02/16/2022 Discharge date: 02/28/2022  Time spent: 26 minutes  Recommendations for Outpatient Follow-up:  Needs potassium, Chem-7 and CBC in about 1 week May continue Ozempic, Dupixent if can obtain from home-continue glipizide knowing fully well this might cause some weight gain May benefit from workup of her right lower extremity neuropathy at [facility] if able Please assess patient and see if she can get CPAP on discharge from CIR as this may help her Going to CIR for further care  Discharge Diagnoses:  MAIN problem for hospitalization   Cellulitis lower extremity right lumbar radiculopathy requiring opiates and gabapentinoids Controlled NIDDM Paroxysmal A-fib on Tikosyn Eliquis Depression OSA needing CPAP  Please see below for itemized issues addressed in HOpsital- refer to other progress notes for clarity if needed  Discharge Condition: Improved  Diet recommendation: Heart healthy diabetic  Filed Weights   02/16/22 1500 02/24/22 1337 02/26/22 1333  Weight: (!) 220 kg (!) 205.4 kg (!) 201.6 kg    History of present illness:  51 year old F with PMH of combined CHF, morbid obesity, A-fib on Eliquis, possible OHS, prolonged QT, lumbar radiculopathy, BPD, fibromyalgia and iron deficiency anemia presenting with bilateral leg swelling,, weight gain, right thigh pain and recurrent falls at home, and admitted for right lower extremity cellulitis, and acute on chronic right lumbar radiculopathy.  She was started on IV Ancef.  MRI lumbar spine ordered but patient was not able to feel MRI due to morbid obesity.   Pain and cellulitis seems to have improved.  Therapy recommended SNF.  She is medically optimized for discharge pending SNF difficult to place she requires bariatric bed.  Hospital Course:  Possible right leg cellulitis:  -Completed 5 days of  IV Ancef. -Continue nystatin powders for possible intertrigo.  Received Diflucan on 12/14. -Leg elevation as able -Continue bumex 2, Aldactone 25- Continues to void well   Lumbar radiculopathy, right: Chronic issue.   -continue lyrica 100 bid--Not able to get lumbar MRI due to body habitus. -P.o. oxycodone 5 mg every 6 hours as needed -PT/OT-recommended SNF, awaiting placement   Iron deficiency anemia: H&H seems to be better than baseline.  Anemia panel with significant iron deficiency. -Received IV ferric gluconate 250 mg daily x 2 -P.o. Protonix -cont to follow cbc trends   Controlled NIDDM-2: A1c 6.1% -Continue SSI-moderate--CBG overall below 180 -continue home Ozempic/Dupixent, continue glipizide on discharge -Cont SSI as needed   Chronic combined CHF: TTE in 2021 with LVEF of 30 to 35%, GH and normal RV function.. had BLE edema and weight gain but not able to elaborate how much she gained.  On home Bumex.  Blood pressure and creatinine stable. -Change losartan to 25, 12/20 -Strict intake and output and daily weight   Paroxysmal A-fib/prolonged QT.  Rate controlled.  Not in RVR.  QTc 498.  She is on Tikosyn. -Continue telemetry monitoring -Continue Tikosyn, 500 bid Inderal 80 tid and Eliquis Electrolytes repleted and potassium improved   Bipolar 1 disorder: stopped all psychiatric meds 2/2 side effects. Is not currently seeing psychiatrist or therapist. No imminent danger. -Resumed home BuSpar 10 -Discontinue Atarax as well as other meds previously only wishes to take BuSpar needs outpatient psychiatry follow-up   Intertrigo: chronic fungal infection(s) intertriginous regions: breast, groin, panniculus:  -Received local and on 12/14.  Continue nystatin powders   Gastroesophageal reflux disease -Continue PPI   Ambulatory dysfunction: Reports  using walker at baseline. -PT/OT-recommended SNF.   Obesity hypoventilation syndrome/possible OSA -Needs sleep study. -using CPAP  while in hospital when able   Eczema -Continue home triamcinolone    Vitamin D deficiency: Ruled out.  Vitamin D level 36.   Morbid obesity: Markedly elevated BMI and ambulatory dysfunction Body mass index is 94.72 kg/m.   Discharge Exam: Vitals:   02/27/22 2057 02/28/22 0540  BP: (!) 107/49 101/62  Pulse: 71 69  Resp: 20 18  Temp: 98.9 F (37.2 C) 98.2 F (36.8 C)  SpO2: 96% 96%    Subj on day of d/c   Awake coherent pleasant laying in bed no distress  General Exam on discharge  EOMI NCAT thick neck Mallampati 4 Abdomen soft no rebound Right lower extremity has some tingling is able to straight leg raise but has some discomfort No fever no chills  Discharge Instructions   Discharge Instructions     Diet - low sodium heart healthy   Complete by: As directed    Increase activity slowly   Complete by: As directed       Allergies as of 02/28/2022       Reactions   Gabapentin Anaphylaxis   Topiramate Other (See Comments)   Jaw tightness and chest discomfort    Advair Diskus [fluticasone-salmeterol]    Imitrex [sumatriptan] Other (See Comments)   Increased heart rate   Latex Itching   Montelukast Other (See Comments)   Nightmares    Other Itching, Other (See Comments)   Pt states "no narcotics ever" 04/13/2019- denies this entry in 01/2020 "Mental health meds, in general, don't agree with me" Plastic and artificial Christmas trees = Itching   Prozac [fluoxetine] Other (See Comments)   Bad, vivid dreams   Trazodone And Nefazodone Other (See Comments)   Bad dreams 01/27/20 Pt is unsure if allergic to Nefazodone   Copper-containing Compounds Rash, Other (See Comments)   Makes the face burn, also        Medication List     STOP taking these medications    ARIPiprazole 5 MG tablet Commonly known as: ABILIFY   furosemide 80 MG tablet Commonly known as: Lasix   hydrOXYzine 50 MG tablet Commonly known as: ATARAX   potassium chloride SA 20 MEQ  tablet Commonly known as: KLOR-CON M       TAKE these medications    acetaminophen 500 MG tablet Commonly known as: TYLENOL Take 1,000 mg by mouth every 6 (six) hours as needed for mild pain.   Acetylcysteine 600 MG Caps Take by mouth.   apixaban 5 MG Tabs tablet Commonly known as: ELIQUIS Take 1 tablet (5 mg total) by mouth 2 (two) times daily. What changed: Another medication with the same name was removed. Continue taking this medication, and follow the directions you see here.   BARIATRIC MULTIVITAMINS/IRON PO Take 1 tablet by mouth daily.   bumetanide 2 MG tablet Commonly known as: BUMEX Take 2 mg by mouth daily.   busPIRone 10 MG tablet Commonly known as: BUSPAR Take 5-10 mg by mouth See admin instructions. Taking 10 mg twice daily and can take 5 mg in the afternoon if needed for anxiety.   dofetilide 500 MCG capsule Commonly known as: TIKOSYN Take 500 mcg by mouth 2 (two) times daily.   Dupixent 300 MG/2ML Sopn Generic drug: Dupilumab Inject 300 mg into the skin every 14 (fourteen) days.   fluticasone furoate-vilanterol 200-25 MCG/ACT Aepb Commonly known as: BREO ELLIPTA Inhale 1 puff into  the lungs daily.   glipiZIDE 10 MG tablet Commonly known as: GLUCOTROL Take 10 mg by mouth 2 (two) times daily before a meal.   levalbuterol 45 MCG/ACT inhaler Commonly known as: XOPENEX HFA Inhale 2 puffs into the lungs every 6 (six) hours as needed for wheezing or shortness of breath.   losartan 25 MG tablet Commonly known as: COZAAR Take 1 tablet (25 mg total) by mouth daily. Start taking on: March 01, 2022 What changed:  medication strength how much to take   Melatonin 10 MG Caps Take 10 mg by mouth daily.   nystatin powder Commonly known as: MYCOSTATIN/NYSTOP Apply topically 2 (two) times daily. What changed:  how much to take when to take this reasons to take this   oxyCODONE 5 MG immediate release tablet Commonly known as: Oxy  IR/ROXICODONE Take 1 tablet (5 mg total) by mouth every 6 (six) hours as needed for breakthrough pain.   Ozempic (0.25 or 0.5 MG/DOSE) 2 MG/3ML Sopn Generic drug: Semaglutide(0.25 or 0.5MG /DOS) Inject 0.25 mg into the skin once a week. Wednesday ( 02-19-22) is the last dose will increase to 0.5 mg weekly.   pantoprazole 40 MG tablet Commonly known as: PROTONIX Take 1 tablet (40 mg total) by mouth 2 (two) times daily.   polyethylene glycol powder 17 GM/SCOOP powder Commonly known as: GLYCOLAX/MIRALAX Take 17 g by mouth daily as needed for mild constipation.   pregabalin 100 MG capsule Commonly known as: LYRICA Take 100 mg by mouth daily.   promethazine 12.5 MG tablet Commonly known as: PHENERGAN Take 12.5 mg by mouth every 6 (six) hours as needed for nausea or vomiting.   propranolol 80 MG tablet Commonly known as: INDERAL Take 80 mg by mouth 3 (three) times daily.   silver sulfADIAZINE 1 % cream Commonly known as: SILVADENE Apply 1 Application topically daily.   simethicone 80 MG chewable tablet Commonly known as: MYLICON Chew 80 mg by mouth every 6 (six) hours as needed for flatulence.   spironolactone 25 MG tablet Commonly known as: ALDACTONE Take 25 mg by mouth daily.   triamcinolone cream 0.1 % Commonly known as: KENALOG Apply 1 Application topically 2 (two) times daily.   Vitamin D (Ergocalciferol) 1.25 MG (50000 UNIT) Caps capsule Commonly known as: DRISDOL Take 50,000 Units by mouth once a week. Monday   Voltaren Arthritis Pain 1 % Gel Generic drug: diclofenac Sodium Apply 2 g topically 4 (four) times daily as needed (knee pain).       Allergies  Allergen Reactions   Gabapentin Anaphylaxis   Topiramate Other (See Comments)    Jaw tightness and chest discomfort    Advair Diskus [Fluticasone-Salmeterol]    Imitrex [Sumatriptan] Other (See Comments)    Increased heart rate   Latex Itching   Montelukast Other (See Comments)    Nightmares    Other  Itching and Other (See Comments)    Pt states "no narcotics ever" 04/13/2019- denies this entry in 01/2020 "Mental health meds, in general, don't agree with me" Plastic and artificial Christmas trees = Itching   Prozac [Fluoxetine] Other (See Comments)    Bad, vivid dreams   Trazodone And Nefazodone Other (See Comments)    Bad dreams 01/27/20 Pt is unsure if allergic to Nefazodone   Copper-Containing Compounds Rash and Other (See Comments)    Makes the face burn, also      The results of significant diagnostics from this hospitalization (including imaging, microbiology, ancillary and laboratory) are listed below for  reference.    Significant Diagnostic Studies: DG Hip Unilat W or Wo Pelvis 2-3 Views Right  Result Date: 02/16/2022 CLINICAL DATA:  Right hip pain and right leg pain EXAM: DG HIP (WITH OR WITHOUT PELVIS) 2-3V RIGHT COMPARISON:  None Available. FINDINGS: There is no evidence of hip fracture or dislocation. There is no evidence of arthropathy or other focal bone abnormality. IMPRESSION: Negative. Electronically Signed   By: Kennith Center M.D.   On: 02/16/2022 06:30   DG Chest Port 1 View  Result Date: 01/31/2022 CLINICAL DATA:  Shortness of breath. EXAM: PORTABLE CHEST 1 VIEW COMPARISON:  01/29/2022 and prior studies FINDINGS: Slightly limited evaluation due to technique/body habitus. Cardiomediastinal silhouette is unchanged. There is no evidence of focal airspace disease, pulmonary edema, suspicious pulmonary nodule/mass, pleural effusion, or pneumothorax. No acute bony abnormalities are identified. IMPRESSION: No active disease. Electronically Signed   By: Harmon Pier M.D.   On: 01/31/2022 11:58    Microbiology: No results found for this or any previous visit (from the past 240 hour(s)).   Labs: Basic Metabolic Panel: Recent Labs  Lab 02/22/22 0343 02/23/22 0334 02/24/22 0403 02/25/22 0351 02/26/22 0333 02/28/22 0331  NA 138 138 136 136 135 132*  K 4.2 4.0 4.5  4.1 4.4 4.2  CL 94* 94* 95* 97* 96* 95*  CO2 35* 28 32 30 28 27   GLUCOSE 124* 120* 118* 129* 149* 122*  BUN 14 17 17 14 14 16   CREATININE 0.95 0.94 0.89 0.92 1.02* 1.05*  CALCIUM 9.0 9.3 9.6 9.1 9.1 9.0  MG 2.1  --   --   --   --   --   PHOS 4.7*  --   --   --   --   --    Liver Function Tests: Recent Labs  Lab 02/22/22 0343 02/23/22 0334 02/24/22 0403 02/25/22 0351 02/26/22 0333  AST  --  34 41 39 42*  ALT  --  11 13 13 16   ALKPHOS  --  73 67 59 61  BILITOT  --  0.6 0.7 0.5 0.8  PROT  --  8.5* 8.6* 7.4 7.4  ALBUMIN 3.6 4.0 4.1 3.4* 3.4*   No results for input(s): "LIPASE", "AMYLASE" in the last 168 hours. No results for input(s): "AMMONIA" in the last 168 hours. CBC: Recent Labs  Lab 02/22/22 0343 02/23/22 0334 02/24/22 0403 02/26/22 0333  WBC  --  10.4 9.2 8.2  HGB 9.3* 10.2* 10.8* 9.7*  HCT 33.6* 37.5 38.3 34.0*  MCV  --  95.9 92.3 91.9  PLT  --  220 219 205   Cardiac Enzymes: No results for input(s): "CKTOTAL", "CKMB", "CKMBINDEX", "TROPONINI" in the last 168 hours. BNP: BNP (last 3 results) Recent Labs    01/29/22 0026 01/31/22 1250 02/16/22 0539  BNP 129.6* 142.2* 120.5*    ProBNP (last 3 results) No results for input(s): "PROBNP" in the last 8760 hours.  CBG: Recent Labs  Lab 02/27/22 0749 02/27/22 1202 02/27/22 1638 02/27/22 2213 02/28/22 0750  GLUCAP 112* 163* 135* 138* 107*       Signed:  03/01/22 MD   [facility] 02/28/2022, 9:43 AM

## 2022-02-28 NOTE — Progress Notes (Signed)
Inpatient Rehabilitation Care Coordinator Assessment and Plan Patient Details  Name: Alyssa Blanchard MRN: [id number] Date of Birth: 08/31/1970  Today's Date: 02/28/2022  Hospital Problems: Principal Problem:   Debility  Past Medical History:  Past Medical History:  Diagnosis Date   Anxiety    Asthma    Atrial fibrillation (HCC)    Depression    DM (diabetes mellitus), type 2 (HCC) 02/16/2022   Dysrhythmia    new onset Afib rvr   GERD (gastroesophageal reflux disease)    Heart failure (HCC)    Heel spur    bilat   History of bronchitis    History of chicken pox    History of urinary tract infection    Migraines    Plantar fasciitis    bilat   STD (sexually transmitted disease)    chl hx & hsv 1&2   Tremors of nervous system    Urinary incontinence    Past Surgical History:  Past Surgical History:  Procedure Laterality Date   CARDIOVERSION N/A 08/05/2019   Procedure: CARDIOVERSION;  Surgeon: Nahser, Deloris Ping, MD;  Location: [facility] ENDOSCOPY;  Service: Cardiovascular;  Laterality: N/A;   LAPAROSCOPIC GASTRIC SLEEVE RESECTION N/A 11/27/2014   Procedure: LAPAROSCOPIC GASTRIC SLEEVE RESECTION WITH HIATAL HERNIA REPAIR UPPER ENDOSCOPY;  Surgeon: Luretha Murphy, MD;  Location: [facility] ORS;  Service: General;  Laterality: N/A;   TEE WITHOUT CARDIOVERSION N/A 08/05/2019   Procedure: TRANSESOPHAGEAL ECHOCARDIOGRAM (TEE);  Surgeon: [name] Deloris Ping, MD;  Location: [facility] ENDOSCOPY;  Service: Cardiovascular;  Laterality: N/A;   TONSILLECTOMY  1978   Social History:  reports that she quit smoking about 9 years ago. Her smoking use included cigarettes. She has a 1.25 pack-year smoking history. She has never used smokeless tobacco. She reports that she does not currently use alcohol. She reports that she does not currently use drugs after having used the following drugs: Marijuana and Cocaine.  Family / Support Systems Marital Status: Single Patient Roles: Parent Other Supports: Ruth-mom  [phone] Anticipated Caregiver: Mom has health issues and unable to physically assist pt. Can do supervision and be there Ability/Limitations of Caregiver: Can not provide assist only supervision, Mom has own health issues Caregiver Availability: Intermittent Family Dynamics: Close with Mom who is supportive and involved but has her own health issues. Pt has a few friends who are supportive but main person is her mom  Social History Preferred language: English Religion: Christian Cultural Background: No issues Education: HS Health Literacy - How often do you need to have someone help you when you read instructions, pamphlets, or other written material from your doctor or pharmacy?: Rarely Writes: Yes Employment Status: Disabled Marine scientist Issues: No issues Guardian/Conservator: none-according to MD pt is capable of making her own decisions while here   Abuse/Neglect Abuse/Neglect Assessment Can Be Completed: Yes Physical Abuse: Denies Verbal Abuse: Denies Sexual Abuse: Denies Exploitation of patient/patient's resources: Denies Self-Neglect: Denies  Patient response to: Social Isolation - How often do you feel lonely or isolated from those around you?: Rarely  Emotional Status Pt's affect, behavior and adjustment status: Pt is wanting to get stronger and get back to her mod/i level she can only go home if she is mod/i and not requiring assist. She is trying to have a good attitude and future focused but it is difficult at times. She has been dealing with this since 12/2021 Recent Psychosocial Issues: other health issues Psychiatric History: History of bipolar was takng meds but quit too much and did  make her feel awful. Will ask neruo-psych to see while here for support and med management Substance Abuse History: No issues  Patient / Family Perceptions, Expectations & Goals Pt/Family understanding of illness & functional limitations: Pt is able to explain her health  issues and reason for being in the hospital she feels it has been a long road and hopes this rehab stay will get her stronger and get her home to stay. She does talk with the MD rounding and feels she has a good understanding of her plan moving forward. Premorbid pt/family roles/activities: daughter, friend, neighbor, etc Anticipated changes in roles/activities/participation: resume Pt/family expectations/goals: Pt states: " I hope to be able to do for myself I have too my Mom can;t help me."  [employer]: Other (Comment) (recently in [facility] dc home two weeks then back in hospital) Premorbid Home Care/DME Agencies: Other (Comment) (old rolling walker) Transportation available at discharge: rides can sign up for medicaid transport Is the patient able to respond to transportation needs?: Yes In the past 12 months, has lack of transportation kept you from medical appointments or from getting medications?: Yes In the past 12 months, has lack of transportation kept you from meetings, work, or from getting things needed for daily living?: Yes Resource referrals recommended: Neuropsychology  Discharge Planning Living Arrangements: Parent Support Systems: Parent, Friends/neighbors Type of Residence: Private residence Insurance Resources: [employer] (specify county) ([employer] community medicaid) Surveyor, quantity Resources: Other (Comment) (Apply for disability) Financial Screen Referred: Yes Living Expenses: Lives with family Money Management: Family Does the patient have any problems obtaining your medications?: No Home Management: mom and pt Patient/Family Preliminary Plans: Hopes to go back to Mom's home and be at a mod/i level, if she is not she may need to go to a SNF, but acute was searching for one and could not find one to take with weight issues and does not receive a monthly check. Care Coordinator Barriers to Discharge: Weight, Insurance for SNF coverage, Decreased  caregiver support Care Coordinator Anticipated Follow Up Needs: HH/OP  Clinical Impression Pleasant motivated female who is ready to be here on rehab and to get stronger to get back home. Her Mom is supportive and involved but has health issues of her own and can not assist her. She can provide supervision. Will await therapy evaluations and ask neuro-psych to see while here.  Lucy Chris 02/28/2022, 2:25 PM

## 2022-02-28 NOTE — Progress Notes (Signed)
Pt refusing CPAP for the night. Will monitor

## 2022-02-28 NOTE — Progress Notes (Signed)
[facility] arrived and pt is transporting to [facility].

## 2022-02-28 NOTE — Progress Notes (Signed)
Inpatient Rehabilitation Center Individual Statement of Services  Patient Name:  Alyssa Blanchard  Date:  02/28/2022  Welcome to the Inpatient Rehabilitation Center.  Our goal is to provide you with an individualized program based on your diagnosis and situation, designed to meet your specific needs.  With this comprehensive rehabilitation program, you will be expected to participate in at least 3 hours of rehabilitation therapies Monday-Friday, with modified therapy programming on the weekends.  Your rehabilitation program will include the following services:  Physical Therapy (PT), Occupational Therapy (OT), 24 hour per day rehabilitation nursing, Therapeutic Recreaction (TR), Neuropsychology, Care Coordinator, Rehabilitation Medicine, Nutrition Services, and Pharmacy Services  Weekly team conferences will be held on Wednesday to discuss your progress.  Your Inpatient Rehabilitation Care Coordinator will talk with you frequently to get your input and to update you on team discussions.  Team conferences with you and your family in attendance may also be held.  Expected length of stay: 10-14 days   Overall anticipated outcome: supervision to independent level  Depending on your progress and recovery, your program may change. Your Inpatient Rehabilitation Care Coordinator will coordinate services and will keep you informed of any changes. Your Inpatient Rehabilitation Care Coordinator's name and contact numbers are listed  below.  The following services may also be recommended but are not provided by the Inpatient Rehabilitation Center:  Driving Evaluations Home Health Rehabiltiation Services Outpatient Rehabilitation Services    Arrangements will be made to provide these services after discharge if needed.  Arrangements include referral to agencies that provide these services.  Your insurance has been verified to be:  [employer] Your primary doctor is:  Alinda Dooms  Pertinent  information will be shared with your doctor and your insurance company.  Inpatient Rehabilitation Care Coordinator:  Dossie Der, [place] [phone] or Luna Glasgow  Information discussed with and copy given to patient by: Lucy Chris, 02/28/2022, 2:27 PM

## 2022-02-28 NOTE — Progress Notes (Signed)
PMR Admission Coordinator Pre-Admission Assessment  Patient: Alyssa Blanchard is an 51 y.o., female MRN: [id number] DOB: 12/14/1970 Height: 5' (152.4 cm) Weight: (!) 201.6 kg  Insurance Information HMO:     PPO:      PCP:      IPA:      80/20:      OTHER:  PRIMARY: [employer] Medicaid      Policy#: [id number]      Subscriber: pt CM Name: Calley W      Phone#: [phone]     Fax#: [phone] Pre-Cert#: [id number] auth for CIR from [facility] with updates due 7 days from admit (12/28) to fax above     Employer:  Benefits:  Phone #:      Name:  Eff. Date: 02/07/2022     Deduct: 0      Out of Pocket Max: 0      Life Max:  CIR: 100%      SNF: 100% Outpatient: 100%     Co-Pay:  Home Health: 100%      Co-Pay:  DME: 100%     Co-Pay:  Providers:  SECONDARY:       Policy#:      Phone#:   Financial Counselor:       Phone#:   The "Data Collection Information Summary" for patients in Inpatient Rehabilitation Facilities with attached "Privacy Act Statement-Health Care Records" was provided and verbally reviewed with: N/A  Emergency Contact Information Contact Information     Name Relation Home Work Mobile   Selley,Ruth A Mother [phone]         Current Medical History  Patient Admitting Diagnosis: debility, radiculopathy  History of Present Illness: Alyssa Blanchard is a 51-year-old right-handed female with history of  chronic anemia, lumbar radiculopathy, fibromyalgia, morbid obesity BMI 86.80 with gastric sleeve 2016 and nonadherence to diet, atrial fibrillation maintained on Eliquis as well as Tikosyn with limited cardiology follow-up Dr. James David Kay, bipolar disorder and patient reportedly stopped all psychiatric meds 2/2 side effects and is not currently seeing a psychiatrist, chronic combined congestive heart failure with ejection fraction of 30 to 35% by TEE 01/31/2020, diabetes mellitus.  Patient recently admitted in October for recurrent episodes of congestive heart failure discharged  to skilled nursing facility/[facility] for 4 weeks and then discharged to home.  Presented to [facility] 02/16/2022 with increasing swelling of lower extremities and recent fall with complaints of tenderness of the right hip and erythema posterior right leg and knee.  Admission chemistries BNP 120.5, sodium 134, glucose 138, hemoglobin 8.7, WBC 13,000, lactic acid 2.0-2.4, urinalysis negative.  X-rays of right hip and leg showed no evidence of fracture or dislocation.  EKG showed A-fib with rate 82.  Placed on intravenous Ancef for cellulitis right proximal lower extremity and completed 5-day course.  She remains on chronic Lyrica for lumbar radiculopathy as well as oxycodone as needed.  MRI not completed due to body habitus.  Therapy evaluations completed and pt was recommended for a comprehensive rehab program.     Patient's medical record from [facility] has been reviewed by the rehabilitation admission coordinator and physician.  Past Medical History  Past Medical History:  Diagnosis Date   Anxiety    Asthma    Atrial fibrillation (HCC)    Depression    DM (diabetes mellitus), type 2 (HCC) 02/16/2022   Dysrhythmia    new onset Afib rvr   GERD (gastroesophageal reflux disease)    Heart failure (HCC)      Heel spur    bilat   History of bronchitis    History of chicken pox    History of urinary tract infection    Migraines    Plantar fasciitis    bilat   STD (sexually transmitted disease)    chl hx & hsv 1&2   Tremors of nervous system    Urinary incontinence     Has the patient had major surgery during 100 days prior to admission? No  Family History   family history includes Alzheimer's disease in her paternal grandmother; Cancer in her father, maternal uncle, and paternal uncle; Diabetes in her maternal grandmother and mother; Heart attack in her maternal grandfather; Heart failure in her maternal grandmother; Hyperlipidemia in her mother; Hypertension in her maternal  grandfather, maternal grandmother, mother, paternal grandfather, and paternal grandmother; Kidney disease in her mother.  Current Medications  Current Facility-Administered Medications:    0.9 %  sodium chloride infusion, 250 mL, Intravenous, PRN, Norins, Michael E, MD, Last Rate: 10 mL/hr at 02/16/22 1530, 10 mL at 02/16/22 1530   acetaminophen (TYLENOL) tablet 1,000 mg, 1,000 mg, Oral, Q6H PRN, Gonfa, Taye T, MD, 1,000 mg at 02/28/22 0340   apixaban (ELIQUIS) tablet 5 mg, 5 mg, Oral, BID, Norins, Michael E, MD, 5 mg at 02/28/22 0921   bariatric multiple vitamin (ADVANCED MULTI EA) chewable 2 tablet, 2 tablet, Oral, Daily, Gonfa, Taye T, MD, 2 tablet at 02/28/22 0918   bumetanide (BUMEX) tablet 2 mg, 2 mg, Oral, Daily, Norins, Michael E, MD, 2 mg at 02/28/22 0919   busPIRone (BUSPAR) tablet 10 mg, 10 mg, Oral, BID, Gonfa, Taye T, MD, 10 mg at 02/28/22 0922   dofetilide (TIKOSYN) capsule 500 mcg, 500 mcg, Oral, BID, Norins, Michael E, MD, 500 mcg at 02/27/22 1948   fluticasone furoate-vilanterol (BREO ELLIPTA) 200-25 MCG/ACT 1 puff, 1 puff, Inhalation, Daily, Norins, Michael E, MD, 1 puff at 02/28/22 0926   hydrOXYzine (ATARAX) tablet 50 mg, 50 mg, Oral, TID PRN, Norins, Michael E, MD   insulin aspart (novoLOG) injection 0-15 Units, 0-15 Units, Subcutaneous, TID WC, Gonfa, Taye T, MD, 2 Units at 02/27/22 1755   insulin aspart (novoLOG) injection 0-5 Units, 0-5 Units, Subcutaneous, QHS, Gonfa, Taye T, MD   levalbuterol (XOPENEX) nebulizer solution 0.63 mg, 0.63 mg, Nebulization, Q6H PRN, Gonfa, Taye T, MD   losartan (COZAAR) tablet 25 mg, 25 mg, Oral, Daily, Samtani, Jai-Gurmukh, MD, 25 mg at 02/28/22 0919   melatonin tablet 10 mg, 10 mg, Oral, QHS, Norins, Michael E, MD, 10 mg at 02/27/22 2225   nystatin (MYCOSTATIN/NYSTOP) topical powder, , Topical, BID, Norins, Michael E, MD, Given at 02/28/22 0927   oxyCODONE (Oxy IR/ROXICODONE) immediate release tablet 5 mg, 5 mg, Oral, Q6H PRN, Gonfa, Taye  T, MD, 5 mg at 02/28/22 0339   pantoprazole (PROTONIX) EC tablet 40 mg, 40 mg, Oral, Daily, Gonfa, Taye T, MD, 40 mg at 02/28/22 0919   polyethylene glycol (MIRALAX / GLYCOLAX) packet 17 g, 17 g, Oral, Daily PRN, Chiu, Stephen K, MD   potassium chloride SA (KLOR-CON M) CR tablet 40 mEq, 40 mEq, Oral, BID, Gonfa, Taye T, MD, 40 mEq at 02/28/22 0918   pregabalin (LYRICA) capsule 100 mg, 100 mg, Oral, BID, Gonfa, Taye T, MD, 100 mg at 02/28/22 0919   promethazine (PHENERGAN) tablet 12.5 mg, 12.5 mg, Oral, Q6H PRN, Gonfa, Taye T, MD, 12.5 mg at 02/25/22 1005   propranolol (INDERAL) tablet 80 mg, 80 mg, Oral, TID, Norins, Michael E,   MD, 80 mg at 02/28/22 0923   silver sulfADIAZINE (SILVADENE) 1 % cream 1 Application, 1 Application, Topical, Daily, Gonfa, Taye T, MD, 1 Application at 02/28/22 0927   simethicone (MYLICON) chewable tablet 80 mg, 80 mg, Oral, Q6H PRN, Norins, Michael E, MD, 80 mg at 02/17/22 2125   sodium chloride flush (NS) 0.9 % injection 3 mL, 3 mL, Intravenous, Q12H, Norins, Michael E, MD, 3 mL at 02/28/22 0927   sodium chloride flush (NS) 0.9 % injection 3 mL, 3 mL, Intravenous, PRN, Norins, Michael E, MD   spironolactone (ALDACTONE) tablet 25 mg, 25 mg, Oral, Daily, Norins, Michael E, MD, 25 mg at 02/28/22 0921   triamcinolone cream (KENALOG) 0.1 % cream 1 Application, 1 Application, Topical, BID, Gonfa, Taye T, MD, 1 Application at 02/28/22 0927   zolpidem (AMBIEN) tablet 5 mg, 5 mg, Oral, QHS PRN, Norins, Michael E, MD  Patients Current Diet:  Diet Order             Diet heart healthy/carb modified Room service appropriate? Yes; Fluid consistency: Thin  Diet effective now                   Precautions / Restrictions Precautions Precautions: Fall Restrictions Weight Bearing Restrictions: No   Has the patient had 2 or more falls or a fall with injury in the past year? Yes  Prior Activity Level Household: using RW, not driving 2/2 pain (out for appointments)  Prior  Functional Level Self Care: Did the patient need help bathing, dressing, using the toilet or eating? Independent  Indoor Mobility: Did the patient need assistance with walking from room to room (with or without device)? Independent  Stairs: Did the patient need assistance with internal or external stairs (with or without device)? Independent  Functional Cognition: Did the patient need help planning regular tasks such as shopping or remembering to take medications? Independent  Patient Information Are you of Hispanic, Latino/a,or Spanish origin?: A. No, not of Hispanic, Latino/a, or Spanish origin What is your race?: A. White Do you need or want an interpreter to communicate with a doctor or health care staff?: 0. No  Patient's Response To:  Health Literacy and Transportation Is the patient able to respond to health literacy and transportation needs?: Yes Health Literacy - How often do you need to have someone help you when you read instructions, pamphlets, or other written material from your doctor or pharmacy?: Rarely In the past 12 months, has lack of transportation kept you from medical appointments or from getting medications?: Yes In the past 12 months, has lack of transportation kept you from meetings, work, or from getting things needed for daily living?: Yes  Home Assistive Devices / Equipment Home Assistive Devices/Equipment: Eyeglasses Home Equipment: Rolling Walker (2 wheels), Shower seat  Prior Device Use: Indicate devices/aids used by the patient prior to current illness, exacerbation or injury? Walker  Current Functional Level Cognition  Overall Cognitive Status: Within Functional Limits for tasks assessed Orientation Level: Oriented X4 General Comments: AxO x 3 very pleasant Lady and willing.  Lives at home with her Mother.    Extremity Assessment (includes Sensation/Coordination)  Upper Extremity Assessment: Overall WFL for tasks assessed  Lower Extremity  Assessment: Defer to PT evaluation RLE Deficits / Details: knee flexion ROM limited ~45 degrees, ankle DF/PF 4/5. RLE: Unable to fully assess due to pain (at knee and thigh) RLE Sensation: decreased light touch (impaired light touch in Rt lateral thigh) RLE Coordination: WNL LLE Deficits /   Details: overall good strength supine testing 4/5 throughout hip abd/add, knee flex/ext, ankle DF/PF. ROM limited by habitus. LLE Sensation: WNL LLE Coordination: WNL (limited by habitus)    ADLs  Overall ADL's : Needs assistance/impaired Eating/Feeding: Modified independent, Sitting Grooming: Set up, Bed level Upper Body Bathing: Bed level, Moderate assistance Lower Body Bathing: Bed level, Total assistance Upper Body Dressing : Bed level, Maximal assistance Lower Body Dressing: Bed level, Total assistance Toilet Transfer: +2 for physical assistance, +2 for safety/equipment Toilet Transfer Details (indicate cue type and reason): patient was able to roll with +2 assistance in bed with use od bed rails with cues for proper positioning. KREG bed being ordered to make it safe for patient to transiton to EOB Toileting- Clothing Manipulation and Hygiene: Bed level, Total assistance Tub/ Shower Transfer: Moderate assistance, Ambulation, Rolling walker (2 wheels), Shower seat Functional mobility during ADLs: Min guard, Rolling walker (2 wheels) General ADL Comments: Pt reports she is concerned about being able to navigate stairs at home.    Mobility  Overal bed mobility: Needs Assistance Bed Mobility: Supine to Sit Rolling: Min assist Supine to sit: Min guard Sit to supine: Min guard, Min assist General bed mobility comments: increased time and heavy use of rails.  difficulty self scooting.    Transfers  Overall transfer level: Needs assistance Equipment used: Rolling walker (2 wheels) Transfers: Sit to/from Stand Sit to Stand: Supervision, Min guard General transfer comment: to/from EOB and elevated  commode. min guard for safety. no physical assist but caution due to B knee pain    Ambulation / Gait / Stairs / Wheelchair Mobility  Ambulation/Gait Ambulation/Gait assistance: Min guard, Min assist Gait Distance (Feet): 24 Feet (12 feet x 2 to and from bathroom) Assistive device: Rolling walker (2 wheels) Gait Pattern/deviations: Step-to pattern, Wide base of support, Decreased step length - left, Decreased step length - right General Gait Details: increased c/o B knee pain and stiffness.  Decreased amb distance 12 feet x 2 to and from bathroom with increased time and much lean on walker. Gait velocity: decreased    Posture / Balance Balance Overall balance assessment: Needs assistance Sitting-balance support: Feet supported Sitting balance-Leahy Scale: Good Standing balance support: Reliant on assistive device for balance, During functional activity, Bilateral upper extremity supported Standing balance-Leahy Scale: Poor Standing balance comment: reliant on external support of RW and bed    Special needs/care consideration Skin cellulitis and Diabetic management yes   Previous Home Environment (from acute therapy documentation) Living Arrangements: Parent Available Help at Discharge: Family Type of Home: House Home Layout: One level Home Access: Stairs to enter Entrance Stairs-Rails: Left, Right Entrance Stairs-Number of Steps: 4+1 Bathroom Shower/Tub: Tub/shower unit Bathroom Toilet: Handicapped height Bathroom Accessibility: Yes Home Care Services: No Additional Comments: Pt reports she lives with her mom. her mother is in poor health as well and has stage IV CKD, pt's mother can not help pt. they also have a cat who the pt's mom cares for.  Discharge Living Setting Plans for Discharge Living Setting: Patient's home, Lives with (comment) (mom) Type of Home at Discharge: House Discharge Home Layout: One level Discharge Home Access: Stairs to enter Entrance Stairs-Rails:  Right, Left Entrance Stairs-Number of Steps: 5 Discharge Bathroom Shower/Tub: Tub/shower unit Discharge Bathroom Toilet: Standard Discharge Bathroom Accessibility: No Does the patient have any problems obtaining your medications?: No  Social/Family/Support Systems Anticipated Caregiver: mod i goals, pts mom for PRN supervision (Ruth) Anticipated Caregiver's Contact Information: [phone] Ability/Limitations of Caregiver: cannot   provide any physical assist Caregiver Availability: Intermittent Discharge Plan Discussed with Primary Caregiver: Yes Is Caregiver In Agreement with Plan?: Yes Does Caregiver/Family have Issues with Lodging/Transportation while Pt is in Rehab?: No  Goals Patient/Family Goal for Rehab: PT/OT mod I, SLP n/a Expected length of stay: 10-12 days Pt/Family Agrees to Admission and willing to participate: Yes Program Orientation Provided & Reviewed with Pt/Caregiver Including Roles  & Responsibilities: Yes Additional Information Needs: bariatric  Decrease burden of Care through IP rehab admission: n/a  Possible need for SNF placement upon discharge: no  Patient Condition: I have reviewed medical records from [facility], spoken with  TOC team , and patient. I discussed via phone for inpatient rehabilitation assessment.  Patient will benefit from ongoing PT and OT, can actively participate in 3 hours of therapy a day 5 days of the week, and can make measurable gains during the admission.  Patient will also benefit from the coordinated team approach during an Inpatient Acute Rehabilitation admission.  The patient will receive intensive therapy as well as Rehabilitation physician, nursing, social worker, and care management interventions.  Due to safety, skin/wound care, disease management, medication administration, pain management, and patient education the patient requires 24 hour a day rehabilitation nursing.  The patient is currently min with mobility and basic ADLs.   Discharge setting and therapy post discharge at home with home health is anticipated.  Patient has agreed to participate in the Acute Inpatient Rehabilitation Program and will admit today.  Preadmission Screen Completed By:  Sarafina Puthoff E Orvis Stann, PT, DPT 02/28/2022 9:37 AM ______________________________________________________________________   Discussed status with Dr. Shtridelman on 02/28/22 at 9:48 AM and received approval for admission today.  Admission Coordinator:  Loel Betancur E Patton Rabinovich, PT, DPT time 9:48 AM/Date 02/28/22    Assessment/Plan: Diagnosis: debility with morbid obesity and right lower extremity cellulitis  Does the need for close, 24 hr/day Medical supervision in concert with the patient's rehab needs make it unreasonable for this patient to be served in a less intensive setting? Yes Co-Morbidities requiring supervision/potential complications: CHF, obesity, DM, GERD, Anemia, cellulitis Due to bladder management, bowel management, safety, skin/wound care, disease management, medication administration, pain management, and patient education, does the patient require 24 hr/day rehab nursing? Yes Does the patient require coordinated care of a physician, rehab nurse, PT, OT, and SLP to address physical and functional deficits in the context of the above medical diagnosis(es)? Yes Addressing deficits in the following areas: balance, endurance, locomotion, strength, transferring, bowel/bladder control, bathing, dressing, feeding, grooming, toileting, and psychosocial support Can the patient actively participate in an intensive therapy program of at least 3 hrs of therapy 5 days a week? Yes The potential for patient to make measurable gains while on inpatient rehab is excellent Anticipated functional outcomes upon discharge from inpatient rehab: modified independent PT, modified independent OT, n/a SLP Estimated rehab length of stay to reach the above functional goals is: 10-12 Anticipated  discharge destination: Home 10. Overall Rehab/Functional Prognosis: excellent   MD Signature: Yuri Shtridelman   MD Signature: Jennye Boroughs

## 2022-02-28 NOTE — Progress Notes (Signed)
Inpatient Rehabilitation Admission Medication Review by a Pharmacist  A complete drug regimen review was completed for this patient to identify any potential clinically significant medication issues.  High Risk Drug Classes Is patient taking? Indication by Medication  Antipsychotic Yes Buspar: anxiety/mood  Anticoagulant Yes Eliquis: Afib  Antibiotic No   Opioid Yes Oxycodone: pain  Antiplatelet No   Hypoglycemics/insulin Yes Novolog: hyperglycemia  Vasoactive Medication Yes Bumex, aldactone, losartan, tikosyn, inderal: CHF/AFib/blood pressure  Chemotherapy No   Other Yes Atarax: anxiety Protonix: GERD Lyrica, Tylenol: pain Melatonin: sleep Breo Ellipta: respiratory, Obesity hypoventilation syndrome/possible OSA Nystatin, Triamcinolone, Silvadene: skin care Miralax: constipation Simethicone: flatulence     Type of Medication Issue Identified Description of Issue Recommendation(s)  Drug Interaction(s) (clinically significant)     Duplicate Therapy     Allergy     No Medication Administration End Date     Incorrect Dose     Additional Drug Therapy Needed     Significant med changes from prior encounter (inform family/care partners about these prior to discharge). Glucotrol, Ozempic, dupixent Communicate medication changes with patient/family at discharge  Other       Clinically significant medication issues were identified that warrant physician communication and completion of prescribed/recommended actions by midnight of the next day:  No   Time spent performing this drug regimen review (minutes): 20   Thank you for allowing [place] to participate in this patients care. Signe Colt, PharmD 02/28/2022 2:16 PM  **Pharmacist phone directory can be found on [email or web address] listed under [facility]**

## 2022-02-28 NOTE — Plan of Care (Signed)
  Problem: Activity: Goal: Risk for activity intolerance will decrease Outcome: Progressing   Problem: Coping: Goal: Level of anxiety will decrease Outcome: Progressing   Problem: Pain Managment: Goal: General experience of comfort will improve Outcome: Progressing   

## 2022-02-28 NOTE — H&P (Incomplete)
Physical Medicine and Rehabilitation Admission H&P    Chief Complaint  Patient presents with   Leg Pain  : HPI: Alyssa Blanchard is a 51 year old right-handed female with history of  chronic anemia, lumbar radiculopathy, fibromyalgia, morbid obesity BMI 86.80 with gastric sleeve 2016 and nonadherence to diet, atrial fibrillation maintained on Eliquis as well as Tikosyn with limited cardiology follow-up Dr. 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  Patient recently admitted in October for recurrent episodes of congestive heart failure discharged to skilled nursing facility/[facility] for 4 weeks and then discharged to home.  Per chart review patient lives with her mother.  1 level home with 4 steps to entry.  Mother is limited physically.  Patient sleeps in a recliner and mobilizes with a rolling walker.  Presented to [facility] long hospital 02/16/2022 with increasing swelling of lower extremities and recent fall with complaints of tenderness of the right hip and erythema posterior right leg and knee.  Admission chemistries BNP 120.5, sodium 134, glucose 138, hemoglobin 8.7, WBC 13,000, lactic acid 2.0-2.4, urinalysis negative.  X-rays of right hip and leg showed no evidence of fracture or dislocation.  EKG showed A-fib with rate 82.  Placed on intravenous Ancef for cellulitis right proximal lower extremity and completed 5-day course.  She remains on chronic Lyrica for lumbar radiculopathy as well as oxycodone as needed.  MRI not completed due to body habitus.  Therapy evaluations completed due to patient's debility decreased functional mobility was admitted for a comprehensive rehab program.  Review of Systems  Constitutional:  Negative for chills and fever.  HENT:   Negative for hearing loss.   Eyes:  Negative for blurred vision and double vision.  Respiratory:  Negative for cough and wheezing.        Shortness of breath with exertion  Cardiovascular:  Positive for palpitations and leg swelling. Negative for chest pain.  Gastrointestinal:  Positive for constipation. Negative for heartburn, nausea and vomiting.       GERD  Genitourinary:  Negative for dysuria, flank pain and hematuria.  Musculoskeletal:  Positive for back pain, falls and myalgias.  Skin:  Negative for rash.  Neurological:  Positive for tremors, weakness and headaches.  Psychiatric/Behavioral:  Positive for depression. The patient has insomnia.        Anxiety/bipolar disorder  All other systems reviewed and are negative.  Past Medical History:  Diagnosis Date   Anxiety    Asthma    Atrial fibrillation (HCC)    Depression    DM (diabetes mellitus), type 2 (HCC) 02/16/2022   Dysrhythmia    new onset Afib rvr   GERD (gastroesophageal reflux disease)    Heart failure (HCC)    Heel spur    bilat   History of bronchitis    History of chicken pox    History of urinary tract infection    Migraines    Plantar fasciitis    bilat   STD (sexually transmitted disease)    chl hx & hsv 1&2   Tremors of nervous system    Urinary incontinence    Past Surgical History:  Procedure Laterality Date   CARDIOVERSION N/A 08/05/2019   Procedure: CARDIOVERSION;  Surgeon: Nahser, Deloris Ping, MD;  Location: [facility] ENDOSCOPY;  Service: Cardiovascular;  Laterality: N/A;  LAPAROSCOPIC GASTRIC SLEEVE RESECTION N/A 11/27/2014   Procedure: LAPAROSCOPIC GASTRIC SLEEVE RESECTION WITH HIATAL HERNIA REPAIR UPPER ENDOSCOPY;  Surgeon: Johnathan Hausen, MD;  Location: [facility] ORS;  Service: General;  Laterality: N/A;   TEE WITHOUT CARDIOVERSION N/A 08/05/2019   Procedure: TRANSESOPHAGEAL ECHOCARDIOGRAM (TEE);  Surgeon: Acie Fredrickson, Wonda Cheng, MD;  Location: [facility] ENDOSCOPY;  Service: Cardiovascular;  Laterality: N/A;    TONSILLECTOMY  1978   Family History  Problem Relation Age of Onset   Diabetes Mother    Hypertension Mother    Hyperlipidemia Mother    Kidney disease Mother        CKD stage 4   Cancer Father        pancreatic   Hypertension Maternal Grandmother    Diabetes Maternal Grandmother    Heart failure Maternal Grandmother        CHF   Heart attack Maternal Grandfather    Hypertension Maternal Grandfather    Hypertension Paternal Grandmother    Alzheimer's disease Paternal Grandmother    Hypertension Paternal Grandfather    Cancer Maternal Uncle        melanoma   Cancer Paternal Uncle        kidney   Social History:  reports that she quit smoking about 9 years ago. Her smoking use included cigarettes. She has a 1.25 pack-year smoking history. She has never used smokeless tobacco. She reports that she does not currently use alcohol. She reports that she does not currently use drugs after having used the following drugs: Marijuana and Cocaine. Allergies:  Allergies  Allergen Reactions   Gabapentin Anaphylaxis   Topiramate Other (See Comments)    Jaw tightness and chest discomfort    Advair Diskus [Fluticasone-Salmeterol]    Imitrex [Sumatriptan] Other (See Comments)    Increased heart rate   Latex Itching   Montelukast Other (See Comments)    Nightmares    Other Itching and Other (See Comments)    Pt states "no narcotics ever" 04/13/2019- denies this entry in 01/2020 "Mental health meds, in general, don't agree with me" Plastic and artificial Christmas trees = Itching   Prozac [Fluoxetine] Other (See Comments)    Bad, vivid dreams   Trazodone And Nefazodone Other (See Comments)    Bad dreams 01/27/20 Pt is unsure if allergic to Nefazodone   Copper-Containing Compounds Rash and Other (See Comments)    Makes the face burn, also   Medications Prior to Admission  Medication Sig Dispense Refill   acetaminophen (TYLENOL) 500 MG tablet Take 1,000 mg by mouth every 6 (six) hours  as needed for mild pain.     apixaban (ELIQUIS) 5 MG TABS tablet Take 5 mg by mouth 2 (two) times daily.     bumetanide (BUMEX) 2 MG tablet Take 2 mg by mouth daily.     busPIRone (BUSPAR) 10 MG tablet Take 5-10 mg by mouth See admin instructions. Taking 10 mg twice daily and can take 5 mg in the afternoon if needed for anxiety.     dofetilide (TIKOSYN) 500 MCG capsule Take 500 mcg by mouth 2 (two) times daily.     Dupilumab (DUPIXENT) 300 MG/2ML SOPN Inject 300 mg into the skin every 14 (fourteen) days.     fluticasone furoate-vilanterol (BREO ELLIPTA) 200-25 MCG/ACT AEPB Inhale 1 puff into the lungs daily.     glipiZIDE (GLUCOTROL) 10 MG tablet Take 10 mg by mouth 2 (two) times daily before a meal.     levalbuterol (XOPENEX HFA) 45 MCG/ACT inhaler Inhale  2 puffs into the lungs every 6 (six) hours as needed for wheezing or shortness of breath.     losartan (COZAAR) 100 MG tablet Take 100 mg by mouth daily.     Melatonin 10 MG CAPS Take 10 mg by mouth daily.     Multiple Vitamins-Minerals (BARIATRIC MULTIVITAMINS/IRON PO) Take 1 tablet by mouth daily.     nystatin (MYCOSTATIN/NYSTOP) powder Apply topically 2 (two) times daily. (Patient taking differently: Apply 1 Application topically 2 (two) times daily as needed (skin irritation).) 15 g 0   pantoprazole (PROTONIX) 40 MG tablet Take 1 tablet (40 mg total) by mouth 2 (two) times daily. 60 tablet 3   polyethylene glycol powder (GLYCOLAX/MIRALAX) 17 GM/SCOOP powder Take 17 g by mouth daily as needed for mild constipation.     potassium chloride SA (KLOR-CON M) 20 MEQ tablet Take 20 mEq by mouth 2 (two) times daily.     pregabalin (LYRICA) 100 MG capsule Take 100 mg by mouth daily.     promethazine (PHENERGAN) 12.5 MG tablet Take 12.5 mg by mouth every 6 (six) hours as needed for nausea or vomiting.     propranolol (INDERAL) 80 MG tablet Take 80 mg by mouth 3 (three) times daily.     Semaglutide,0.25 or 0.5MG /DOS, (OZEMPIC, 0.25 OR 0.5 MG/DOSE,) 2  MG/3ML SOPN Inject 0.25 mg into the skin once a week. Wednesday ( 02-19-22) is the last dose will increase to 0.5 mg weekly.     silver sulfADIAZINE (SILVADENE) 1 % cream Apply 1 Application topically daily.     simethicone (MYLICON) 80 MG chewable tablet Chew 80 mg by mouth every 6 (six) hours as needed for flatulence.     spironolactone (ALDACTONE) 25 MG tablet Take 25 mg by mouth daily.     triamcinolone cream (KENALOG) 0.1 % Apply 1 Application topically 2 (two) times daily.     Vitamin D, Ergocalciferol, (DRISDOL) 1.25 MG (50000 UNIT) CAPS capsule Take 50,000 Units by mouth once a week. Monday     VOLTAREN ARTHRITIS PAIN 1 % GEL Apply 2 g topically 4 (four) times daily as needed (knee pain).     Acetylcysteine 600 MG CAPS Take by mouth. (Patient not taking: Reported on 02/16/2022)     ARIPiprazole (ABILIFY) 5 MG tablet Take 1 tablet (5 mg total) by mouth daily. For mood control (Patient not taking: Reported on 02/16/2022) 30 tablet 0   furosemide (LASIX) 80 MG tablet Take 1 tablet (80 mg total) by mouth daily for 7 days. 7 tablet 0   hydrOXYzine (ATARAX/VISTARIL) 50 MG tablet Take 1 tablet (50 mg total) by mouth 3 (three) times daily as needed for anxiety. (Patient not taking: Reported on 02/16/2022) 75 tablet 0      Home: Home Living Family/patient expects to be discharged to:: Skilled nursing facility Living Arrangements: Parent Available Help at Discharge: Family Type of Home: House Home Access: Stairs to enter Secretary/administrator of Steps: 4+1 Entrance Stairs-Rails: Left, Right Home Layout: One level Bathroom Shower/Tub: Engineer, manufacturing systems: Handicapped height Bathroom Accessibility: Yes Home Equipment: Agricultural consultant (2 wheels), Shower seat Additional Comments: Pt reports she lives with her mom. her mother is in poor health as well and has stage IV CKD, pt's mother can not help pt. they also have a cat who the pt's mom cares for.   Functional History: Prior  Function Prior Level of Function : Independent/Modified Independent Mobility Comments: pt sleeps in a recliner and mobilizes in home with RW, reports none  of her DME is bariatric. ADLs Comments: pt reports she showers on good days but it is a long task. batrhoom has a shower seat but not grab bars and she has to step into/out of a tub-shower.  Functional Status:  Mobility: Bed Mobility Overal bed mobility: Needs Assistance Bed Mobility: Supine to Sit Rolling: Min assist Supine to sit: Min guard Sit to supine: Min guard, Min assist General bed mobility comments: increased time and heavy use of rails.  difficulty self scooting. Transfers Overall transfer level: Needs assistance Equipment used: Rolling walker (2 wheels) Transfers: Sit to/from Stand Sit to Stand: Supervision, Min guard General transfer comment: to/from EOB and elevated commode. min guard for safety. no physical assist but caution due to B knee pain Ambulation/Gait Ambulation/Gait assistance: Min guard, Min assist Gait Distance (Feet): 24 Feet (12 feet x 2 to and from bathroom) Assistive device: Rolling walker (2 wheels) Gait Pattern/deviations: Step-to pattern, Wide base of support, Decreased step length - left, Decreased step length - right General Gait Details: increased c/o B knee pain and stiffness.  Decreased amb distance 12 feet x 2 to and from bathroom with increased time and much lean on walker. Gait velocity: decreased    ADL: ADL Overall ADL's : Needs assistance/impaired Eating/Feeding: Modified independent, Sitting Grooming: Set up, Bed level Upper Body Bathing: Bed level, Moderate assistance Lower Body Bathing: Bed level, Total assistance Upper Body Dressing : Bed level, Maximal assistance Lower Body Dressing: Bed level, Total assistance Toilet Transfer: +2 for physical assistance, +2 for safety/equipment Toilet Transfer Details (indicate cue type and reason): patient was able to roll with +2 assistance  in bed with use od bed rails with cues for proper positioning. KREG bed being ordered to make it safe for patient to transiton to [place] and Hygiene: Bed level, Total assistance Tub/ Shower Transfer: Moderate assistance, Ambulation, Rolling walker (2 wheels), Shower seat Functional mobility during ADLs: Min guard, Rolling walker (2 wheels) General ADL Comments: Pt reports she is concerned about being able to navigate stairs at home.  Cognition: Cognition Overall Cognitive Status: Within Functional Limits for tasks assessed Orientation Level: Oriented X4 Cognition Arousal/Alertness: Awake/alert Behavior During Therapy: WFL for tasks assessed/performed Overall Cognitive Status: Within Functional Limits for tasks assessed General Comments: AxO x 3 very pleasant Lady and willing.  Lives at home with her Mother.  Physical Exam: Blood pressure 101/62, pulse 69, temperature 98.2 F (36.8 C), temperature source Oral, resp. rate 18, height 5' (1.524 m), weight (!) 201.6 kg, SpO2 96 %. Physical Exam Neurological:     Comments: Patient is alert.  Makes eye contact with examiner and follows commands.  Provides name and age.  Fair insight and awareness.     Results for orders placed or performed during the hospital encounter of 02/16/22 (from the past 48 hour(s))  Glucose, capillary     Status: Abnormal   Collection Time: 02/26/22  7:43 AM  Result Value Ref Range   Glucose-Capillary 117 (H) 70 - 99 mg/dL    Comment: Glucose reference range applies only to samples taken after fasting for at least 8 hours.  Glucose, capillary     Status: Abnormal   Collection Time: 02/26/22 11:57 AM  Result Value Ref Range   Glucose-Capillary 140 (H) 70 - 99 mg/dL    Comment: Glucose reference range applies only to samples taken after fasting for at least 8 hours.  Glucose, capillary     Status: Abnormal   Collection Time: 02/26/22  5:24 PM  Result Value Ref Range    Glucose-Capillary 112 (H) 70 - 99 mg/dL    Comment: Glucose reference range applies only to samples taken after fasting for at least 8 hours.  Glucose, capillary     Status: Abnormal   Collection Time: 02/26/22 10:04 PM  Result Value Ref Range   Glucose-Capillary 131 (H) 70 - 99 mg/dL    Comment: Glucose reference range applies only to samples taken after fasting for at least 8 hours.  Glucose, capillary     Status: Abnormal   Collection Time: 02/27/22  7:49 AM  Result Value Ref Range   Glucose-Capillary 112 (H) 70 - 99 mg/dL    Comment: Glucose reference range applies only to samples taken after fasting for at least 8 hours.  Glucose, capillary     Status: Abnormal   Collection Time: 02/27/22 12:02 PM  Result Value Ref Range   Glucose-Capillary 163 (H) 70 - 99 mg/dL    Comment: Glucose reference range applies only to samples taken after fasting for at least 8 hours.  Glucose, capillary     Status: Abnormal   Collection Time: 02/27/22  4:38 PM  Result Value Ref Range   Glucose-Capillary 135 (H) 70 - 99 mg/dL    Comment: Glucose reference range applies only to samples taken after fasting for at least 8 hours.  Glucose, capillary     Status: Abnormal   Collection Time: 02/27/22 10:13 PM  Result Value Ref Range   Glucose-Capillary 138 (H) 70 - 99 mg/dL    Comment: Glucose reference range applies only to samples taken after fasting for at least 8 hours.  Basic metabolic panel     Status: Abnormal   Collection Time: 02/28/22  3:31 AM  Result Value Ref Range   Sodium 132 (L) 135 - 145 mmol/L   Potassium 4.2 3.5 - 5.1 mmol/L   Chloride 95 (L) 98 - 111 mmol/L   CO2 27 22 - 32 mmol/L   Glucose, Bld 122 (H) 70 - 99 mg/dL    Comment: Glucose reference range applies only to samples taken after fasting for at least 8 hours.   BUN 16 6 - 20 mg/dL   Creatinine, Ser 1.05 (H) 0.44 - 1.00 mg/dL   Calcium 9.0 8.9 - 10.3 mg/dL   GFR, Estimated >60 >60 mL/min    Comment: (NOTE) Calculated using  the CKD-EPI Creatinine Equation (2021)    Anion gap 10 5 - 15    Comment: Performed at [facility], [place] [address] [place], [place] [address]   No results found.    Blood pressure 101/62, pulse 69, temperature 98.2 F (36.8 C), temperature source Oral, resp. rate 18, height 5' (1.524 m), weight (!) 201.6 kg, SpO2 96 %.  Medical Problem List and Plan: 1. Functional deficits secondary to debility/morbid obesity/right lower extremity cellulitis  -patient may *** shower  -ELOS/Goals: *** 2.  Antithrombotics: -DVT/anticoagulation:  Pharmaceutical: Eliquis  -antiplatelet therapy: N/A 3. Pain Management: Lyrica 100 mg twice daily, oxycodone as needed, 4. Mood/Behavior/Sleep: BuSpar 10 mg twice daily, Atarax 50 mg 3 times daily as needed anxiety  -antipsychotic agents: Provide emotional support 5. Neuropsych/cognition: This patient is capable of making decisions on her own behalf. 6. Skin/Wound Care: Routine skin checks 7. Fluids/Electrolytes/Nutrition: Routine in and outs with follow-up chemistries 8.  Right leg cellulitis.  Completing course 5 days Ancef. 9.  Chronic combined CHF.  Bumex 2 mg daily, Aldactone 25 mg daily.,  Cozaar 25  mg daily monitor for any fluid overload. 10.  Atrial fibrillation.  Continue Eliquis 5 mg twice daily, Tikosyn 500 mg twice daily, Inderal 80 mg 3 times daily 11.  Diabetes mellitus.  Hemoglobin A1c 6.1.  SSI.  Patient on Glucotrol 10 mg twice daily prior to admission.  Resume as needed 12.  Morbid obesity.  BMI 86.80.  Dietary follow-up.  History of gastric sleeve 2016 and nonadherence to diet. 13.  GERD.  Protonix 14.  Iron deficiency anemia.  Follow-up CBC Cathlyn Parsons, PA-C 02/28/2022

## 2022-02-28 NOTE — TOC Transition Note (Signed)
Transition of Care [facility]) - CM/SW Discharge Note   Patient Details  Name: Alyssa Blanchard MRN: [id number] Date of Birth: [birth year]  [facility] CM/SW Contact:  Amada Jupiter, LCSW Phone Number: 02/28/2022, 11:06 AM   Clinical Narrative:     Pt accepted for admission to [facility] CIR program and insurance authorization received.  Plan for dc today to CIR via [facility].  TOC signing off.  Final next level of care: IP Rehab Facility Barriers to Discharge: Barriers Resolved   Patient Goals and CMS Choice CMS Medicare.gov Compare Post Acute Care list provided to:: Patient Choice offered to / list presented to : Patient  Discharge Placement                         Discharge Plan and Services Additional resources added to the After Visit Summary for   In-house Referral: Clinical Social Work   Post Acute Care Choice: Skilled Nursing Facility          DME Arranged: N/A DME Agency: NA                  Social Determinants of Health (SDOH) Interventions SDOH Screenings   Food Insecurity: No Food Insecurity (02/16/2022)  Housing: Low Risk  (02/16/2022)  Transportation Needs: No Transportation Needs (02/16/2022)  Utilities: Not At Risk (02/16/2022)  Alcohol Screen: Medium Risk (01/30/2020)  Tobacco Use: Medium Risk (02/16/2022)     Readmission Risk Interventions    02/28/2022   11:05 AM  Readmission Risk Prevention Plan  Transportation Screening Complete  PCP or Specialist Appt within 5-7 Days Complete  Home Care Screening Complete  Medication Review (RN CM) Complete

## 2022-02-28 NOTE — Progress Notes (Signed)
Notified RN on 4W pt's BS is 140 unable to give cause pt is on her way out and RN states he will cover pt once she gets there.

## 2022-02-28 NOTE — Discharge Instructions (Addendum)
Inpatient Rehab Discharge Instructions  Alyssa Blanchard Discharge date and time: No discharge date for patient encounter.   Activities/Precautions/ Functional Status: Activity: activity as tolerated Diet: diabetic diet Wound Care: Routine skin checks Functional status:  ___ No restrictions     ___ Walk up steps independently ___ 24/7 supervision/assistance   ___ Walk up steps with assistance ___ Intermittent supervision/assistance  ___ Bathe/dress independently ___ Walk with walker     __x_ Bathe/dress with assistance ___ Walk Independently    ___ Shower independently ___ Walk with assistance    ___ Shower with assistance ___ No alcohol     ___ Return to work/school ________  Special Instructions:  No driving smoking or alcohol    COMMUNITY REFERRALS UPON DISCHARGE:    Outpatient: PT & OT             Agency: [facility] [address] [place] [address] Phone:[phone]              Appointment Date/Time:WILL CALL TO SET UP FOLLOW UP APPOINTMENTS  Medical Equipment/Items Ordered: HOSPITAL BED, BARIATRIC ROLLING WALKER AND BARIATRIC DROP-ARM BEDSIDE COMMODE                                                 Agency/Supplier:[facility]  [phone]  MEDICAID TRANSPORT [phone] CALL TO CERTIFY YOURSELF    My questions have been answered and I understand these instructions. I will adhere to these goals and the provided educational materials after my discharge from the hospital.  Patient/Caregiver Signature _______________________________ Date __________  Clinician Signature _______________________________________ Date __________  Please bring this form and your medication list with you to all your follow-up doctor's appointments.

## 2022-02-28 NOTE — PMR Pre-admission (Signed)
PMR Admission Coordinator Pre-Admission Assessment  Patient: Alyssa Blanchard is an 51 y.o., female MRN: [id number] DOB: 1970-08-21 Height: 5' (152.4 cm) Weight: (!) 201.6 kg  Insurance Information HMO:     PPO:      PCP:      IPA:      80/20:      OTHER:  PRIMARY: [employer] Medicaid      Policy#: [id number]      Subscriber: pt CM Name: Ferd Hibbs      Phone#: [phone]     Fax#: [phone] Pre-Cert#: [id number] Cutter for CIR from [facility] with updates due 7 days from admit (12/28) to fax above     Employer:  Benefits:  Phone #:      Name:  Eff. Date: 02/07/2022     Deduct: 0      Out of Pocket Max: 0      Life Max:  CIR: 100%      SNF: 100% Outpatient: 100%     Co-Pay:  Home Health: 100%      Co-Pay:  DME: 100%     Co-Pay:  Providers:  SECONDARY:       Policy#:      Phone#:   Development worker, community:       Phone#:   The "Data Collection Information Summary" for patients in Inpatient Rehabilitation Facilities with attached "Privacy Act [place] Records" was provided and verbally reviewed with: N/A  Emergency Contact Information Contact Information     Name Relation Home Work Mobile   Housewright,Ruth A Mother [phone]         Current Medical History  Patient Admitting Diagnosis: debility, radiculopathy  History of Present Illness: Alyssa Blanchard is a 51 year old right-handed female with history of  chronic anemia, lumbar radiculopathy, fibromyalgia, morbid obesity BMI 86.80 with gastric sleeve 2016 and nonadherence to diet, atrial fibrillation maintained on Eliquis as well as Tikosyn with limited cardiology follow-up Dr. Glorious Peach, bipolar disorder and patient reportedly stopped all psychiatric meds 2/2 side effects and is not currently seeing a psychiatrist, chronic combined congestive heart failure with ejection fraction of 30 to 35% by TEE 01/31/2020, diabetes mellitus.  Patient recently admitted in October for recurrent episodes of congestive heart failure discharged  to skilled nursing facility/[facility] for 4 weeks and then discharged to home.  [facility]ed [facility]16/2022 with increasing swelling of lower extremities and recent fall with complaints of tenderness of the right hip and erythema posterior right leg and knee.  Admission chemistries BNP 120.5, sodium 134, glucose 138, hemoglobin 8.7, WBC 13,000, lactic acid 2.0-2.4, urinalysis negative.  X-rays of right hip and leg showed no evidence of fracture or dislocation.  EKG showed A-fib with rate 82.  Placed on intravenous Ancef for cellulitis right proximal lower extremity and completed 5-day course.  She remains on chronic Lyrica for lumbar radiculopathy as well as oxycodone as needed.  MRI not completed due to body habitus.  Therapy evaluations completed and pt was recommended for a comprehensive rehab program.     Patient's medical record from [facility] has been reviewed by the rehabilitation admission coordinator and physician.  Past Medical History  Past Medical History:  Diagnosis Date   Anxiety    Asthma    Atrial fibrillation ([place])    Depression    DM (diabetes mellitus), type 2 ([place]) 02/16/2022   Dysrhythmia    new onset Afib rvr   GERD (gastroesophageal reflux disease)    Heart failure ([place])  Heel spur    bilat   History of bronchitis    History of chicken pox    History of urinary tract infection    Migraines    Plantar fasciitis    bilat   STD (sexually transmitted disease)    chl hx & hsv 1&2   Tremors of nervous system    Urinary incontinence     Has the patient had major surgery during 100 days prior to admission? No  Family History   family history includes Alzheimer's disease in her paternal grandmother; Cancer in her father, maternal uncle, and paternal uncle; Diabetes in her maternal grandmother and mother; Heart attack in her maternal grandfather; Heart failure in her maternal grandmother; Hyperlipidemia in her mother; Hypertension in her maternal  grandfather, maternal grandmother, mother, paternal grandfather, and paternal grandmother; Kidney disease in her mother.  Current Medications  Current Facility-Administered Medications:    0.9 %  sodium chloride infusion, 250 mL, Intravenous, PRN, Norins, Rosalyn Gess, MD, Last Rate: 10 mL/hr at 02/16/22 1530, 10 mL at 02/16/22 1530   acetaminophen (TYLENOL) tablet 1,000 mg, 1,000 mg, Oral, Q6H PRN, Alanda Slim, Taye T, MD, 1,000 mg at 02/28/22 0340   apixaban (ELIQUIS) tablet 5 mg, 5 mg, Oral, BID, Norins, Rosalyn Gess, MD, 5 mg at 02/28/22 2130   bariatric multiple vitamin (ADVANCED MULTI EA) chewable 2 tablet, 2 tablet, Oral, Daily, Candelaria Stagers T, MD, 2 tablet at 02/28/22 8657   bumetanide (BUMEX) tablet 2 mg, 2 mg, Oral, Daily, Norins, Rosalyn Gess, MD, 2 mg at 02/28/22 0919   busPIRone (BUSPAR) tablet 10 mg, 10 mg, Oral, BID, Alanda Slim, Taye T, MD, 10 mg at 02/28/22 8469   dofetilide (TIKOSYN) capsule 500 mcg, 500 mcg, Oral, BID, Norins, Rosalyn Gess, MD, 500 mcg at 02/27/22 1948   fluticasone furoate-vilanterol (BREO ELLIPTA) 200-25 MCG/ACT 1 puff, 1 puff, Inhalation, Daily, Norins, Rosalyn Gess, MD, 1 puff at 02/28/22 0926   hydrOXYzine (ATARAX) tablet 50 mg, 50 mg, Oral, TID PRN, Norins, Rosalyn Gess, MD   insulin aspart (novoLOG) injection 0-15 Units, 0-15 Units, Subcutaneous, TID WC, Gonfa, Taye T, MD, 2 Units at 02/27/22 1755   insulin aspart (novoLOG) injection 0-5 Units, 0-5 Units, Subcutaneous, QHS, Gonfa, Taye T, MD   levalbuterol (XOPENEX) nebulizer solution 0.63 mg, 0.63 mg, Nebulization, Q6H PRN, Alanda Slim, Taye T, MD   losartan (COZAAR) tablet 25 mg, 25 mg, Oral, Daily, Samtani, Jai-Gurmukh, MD, 25 mg at 02/28/22 0919   melatonin tablet 10 mg, 10 mg, Oral, QHS, Norins, Rosalyn Gess, MD, 10 mg at 02/27/22 2225   nystatin (MYCOSTATIN/NYSTOP) topical powder, , Topical, BID, Norins, Rosalyn Gess, MD, Given at 02/28/22 [phone]   oxyCODONE (Oxy IR/ROXICODONE) immediate release tablet 5 mg, 5 mg, Oral, Q6H PRN, Alanda Slim, Taye  T, MD, 5 mg at 02/28/22 0339   pantoprazole (PROTONIX) EC tablet 40 mg, 40 mg, Oral, Daily, Gonfa, Taye T, MD, 40 mg at 02/28/22 0919   polyethylene glycol (MIRALAX / GLYCOLAX) packet 17 g, 17 g, Oral, Daily PRN, Jerald Kief, MD   potassium chloride SA (KLOR-CON M) CR tablet 40 mEq, 40 mEq, Oral, BID, Gonfa, Taye T, MD, 40 mEq at 02/28/22 0918   pregabalin (LYRICA) capsule 100 mg, 100 mg, Oral, BID, Gonfa, Taye T, MD, 100 mg at 02/28/22 0919   promethazine (PHENERGAN) tablet 12.5 mg, 12.5 mg, Oral, Q6H PRN, Alanda Slim, Taye T, MD, 12.5 mg at 02/25/22 1005   propranolol (INDERAL) tablet 80 mg, 80 mg, Oral, TID, Norins, Rosalyn Gess,  MD, 80 mg at 02/28/22 [id number]   silver sulfADIAZINE (SILVADENE) 1 % cream 1 Application, 1 Application, Topical, Daily, Cyndia Skeeters, Taye T, MD, 1 Application at 123456 [id number]   simethicone (MYLICON) chewable tablet 80 mg, 80 mg, Oral, Q6H PRN, Norins, Heinz Knuckles, MD, 80 mg at 02/17/22 2125   sodium chloride flush (NS) 0.9 % injection 3 mL, 3 mL, Intravenous, Q12H, Norins, Heinz Knuckles, MD, 3 mL at 02/28/22 [id number]   sodium chloride flush (NS) 0.9 % injection 3 mL, 3 mL, Intravenous, PRN, Norins, Heinz Knuckles, MD   spironolactone (ALDACTONE) tablet 25 mg, 25 mg, Oral, Daily, Norins, Heinz Knuckles, MD, 25 mg at 02/28/22 [id number]   triamcinolone cream (KENALOG) 0.1 % cream 1 Application, 1 Application, Topical, BID, Wendee Beavers T, MD, 1 Application at 123456 [id number]   zolpidem (AMBIEN) tablet 5 mg, 5 mg, Oral, QHS PRN, Norins, Heinz Knuckles, MD  Patients Current Diet:  Diet Order             Diet heart healthy/carb modified Room service appropriate? Yes; Fluid consistency: Thin  Diet effective now                   Precautions / Restrictions Precautions Precautions: Fall Restrictions Weight Bearing Restrictions: No   Has the patient had 2 or more falls or a fall with injury in the past year? Yes  Prior Activity Level Household: using RW, not driving 2/2 pain (out for appointments)  Prior  Functional Level Self Care: Did the patient need help bathing, dressing, using the toilet or eating? Independent  Indoor Mobility: Did the patient need assistance with walking from room to room (with or without device)? Independent  Stairs: Did the patient need assistance with internal or external stairs (with or without device)? Independent  Functional Cognition: Did the patient need help planning regular tasks such as shopping or remembering to take medications? Independent  Patient Information Are you of Hispanic, Latino/a,or Spanish origin?: A. No, not of Hispanic, Latino/a, or Spanish origin What is your race?: A. White Do you need or want an interpreter to communicate with a doctor or health care staff?: 0. No  Patient's Response To:  Health Literacy and Transportation Is the patient able to respond to health literacy and transportation needs?: Yes Health Literacy - How often do you need to have someone help you when you read instructions, pamphlets, or other written material from your doctor or pharmacy?: Rarely In the past 12 months, has lack of transportation kept you from medical appointments or from getting medications?: Yes In the past 12 months, has lack of transportation kept you from meetings, work, or from getting things needed for daily living?: Yes  [place] / [place] Devices/Equipment: Eyeglasses Home Equipment: Conservation officer, nature (2 wheels), Shower seat  Prior Device Use: Indicate devices/aids used by the patient prior to current illness, exacerbation or injury? Walker  Current Functional Level Cognition  Overall Cognitive Status: Within Functional Limits for tasks assessed Orientation Level: Oriented X4 General Comments: AxO x 3 very pleasant Lady and willing.  Lives at home with her Mother.    Extremity Assessment (includes Sensation/Coordination)  Upper Extremity Assessment: Overall WFL for tasks assessed  Lower Extremity  Assessment: Defer to PT evaluation RLE Deficits / Details: knee flexion ROM limited ~45 degrees, ankle DF/PF 4/5. RLE: Unable to fully assess due to pain (at knee and thigh) RLE Sensation: decreased light touch (impaired light touch in Rt lateral thigh) RLE Coordination: WNL LLE Deficits /  Details: overall good strength supine testing 4/5 throughout hip abd/add, knee flex/ext, ankle DF/PF. ROM limited by habitus. LLE Sensation: WNL LLE Coordination: WNL (limited by habitus)    ADLs  Overall ADL's : Needs assistance/impaired Eating/Feeding: Modified independent, Sitting Grooming: Set up, Bed level Upper Body Bathing: Bed level, Moderate assistance Lower Body Bathing: Bed level, Total assistance Upper Body Dressing : Bed level, Maximal assistance Lower Body Dressing: Bed level, Total assistance Toilet Transfer: +2 for physical assistance, +2 for safety/equipment Toilet Transfer Details (indicate cue type and reason): patient was able to roll with +2 assistance in bed with use od bed rails with cues for proper positioning. KREG bed being ordered to make it safe for patient to transiton to [place] and Hygiene: Bed level, Total assistance Tub/ Shower Transfer: Moderate assistance, Ambulation, Rolling walker (2 wheels), Shower seat Functional mobility during ADLs: Min guard, Rolling walker (2 wheels) General ADL Comments: Pt reports she is concerned about being able to navigate stairs at home.    Mobility  Overal bed mobility: Needs Assistance Bed Mobility: Supine to Sit Rolling: Min assist Supine to sit: Min guard Sit to supine: Min guard, Min assist General bed mobility comments: increased time and heavy use of rails.  difficulty self scooting.    Transfers  Overall transfer level: Needs assistance Equipment used: Rolling walker (2 wheels) Transfers: Sit to/from Stand Sit to Stand: Supervision, Min guard General transfer comment: to/from EOB and elevated  commode. min guard for safety. no physical assist but caution due to B knee pain    Ambulation / Gait / Stairs / Wheelchair Mobility  Ambulation/Gait Ambulation/Gait assistance: Min guard, Min assist Gait Distance (Feet): 24 Feet (12 feet x 2 to and from bathroom) Assistive device: Rolling walker (2 wheels) Gait Pattern/deviations: Step-to pattern, Wide base of support, Decreased step length - left, Decreased step length - right General Gait Details: increased c/o B knee pain and stiffness.  Decreased amb distance 12 feet x 2 to and from bathroom with increased time and much lean on walker. Gait velocity: decreased    Posture / Balance Balance Overall balance assessment: Needs assistance Sitting-balance support: Feet supported Sitting balance-Leahy Scale: Good Standing balance support: Reliant on assistive device for balance, During functional activity, Bilateral upper extremity supported Standing balance-Leahy Scale: Poor Standing balance comment: reliant on external support of RW and bed    Special needs/care consideration Skin cellulitis and Diabetic management yes   Previous Home Environment (from acute therapy documentation) Living Arrangements: Parent Available Help at Discharge: Family Type of Home: House Home Layout: One level Home Access: Stairs to enter Entrance Stairs-Rails: Left, Right Entrance Stairs-Number of Steps: 4+1 Bathroom Shower/Tub: Chiropodist: Handicapped height Bathroom Accessibility: Yes Home Care Services: No Additional Comments: Pt reports she lives with her mom. her mother is in poor health as well and has stage IV CKD, pt's mother can not help pt. they also have a cat who the pt's mom cares for.  Discharge Living Setting Plans for Discharge Living Setting: Patient's home, Lives with (comment) (mom) Type of Home at Discharge: House Discharge Home Layout: One level Discharge Home Access: Stairs to enter Entrance Stairs-Rails:  Right, Left Entrance Stairs-Number of Steps: 5 Discharge Bathroom Shower/Tub: Tub/shower unit Discharge Bathroom Toilet: Standard Discharge Bathroom Accessibility: No Does the patient have any problems obtaining your medications?: No  Social/Family/Support Systems Anticipated Caregiver: mod i goals, pts mom for PRN supervision Rod Holler) Anticipated Caregiver's Contact Information: [phone] Ability/Limitations of Caregiver: cannot  provide any physical assist Caregiver Availability: Intermittent Discharge Plan Discussed with Primary Caregiver: Yes Is Caregiver In Agreement with Plan?: Yes Does Caregiver/Family have Issues with Lodging/Transportation while Pt is in Rehab?: No  Goals Patient/Family Goal for Rehab: PT/OT mod I, SLP n/a Expected length of stay: 10-12 days Pt/Family Agrees to Admission and willing to participate: Yes Program Orientation Provided & Reviewed with Pt/Caregiver Including Roles  & Responsibilities: Yes Additional Information Needs: bariatric  Decrease burden of Care through IP rehab admission: n/a  Possible need for SNF placement upon discharge: no  Patient Condition: I have reviewed medical records from [facility], spoken with  [facility] team , and patient. I discussed via phone for inpatient rehabilitation assessment.  Patient will benefit from ongoing PT and OT, can actively participate in 3 hours of therapy a day 5 days of the week, and can make measurable gains during the admission.  Patient will also benefit from the coordinated team approach during an Inpatient Acute Rehabilitation admission.  The patient will receive intensive therapy as well as Rehabilitation physician, nursing, social worker, and care management interventions.  Due to safety, skin/wound care, disease management, medication administration, pain management, and patient education the patient requires 24 hour a day rehabilitation nursing.  The patient is currently min with mobility and basic ADLs.   Discharge setting and therapy post discharge at home with home health is anticipated.  Patient has agreed to participate in the Acute Inpatient Rehabilitation Program and will admit today.  Preadmission Screen Completed By:  Michel Santee, PT, DPT 02/28/2022 9:37 AM ______________________________________________________________________   Discussed status with Dr. Curlene Dolphin on 02/28/22 at 9:48 AM and received approval for admission today.  Admission Coordinator:  Michel Santee, PT, DPT time 9:48 AM/Date 02/28/22    Assessment/Plan: Diagnosis: debility with morbid obesity and right lower extremity cellulitis  Does the need for close, 24 hr/day Medical supervision in concert with the patient's rehab needs make it unreasonable for this patient to be served in a less intensive setting? Yes Co-Morbidities requiring supervision/potential complications: CHF, obesity, DM, GERD, Anemia, cellulitis Due to bladder management, bowel management, safety, skin/wound care, disease management, medication administration, pain management, and patient education, does the patient require 24 hr/day rehab nursing? Yes Does the patient require coordinated care of a physician, rehab nurse, PT, OT, and SLP to address physical and functional deficits in the context of the above medical diagnosis(es)? Yes Addressing deficits in the following areas: balance, endurance, locomotion, strength, transferring, bowel/bladder control, bathing, dressing, feeding, grooming, toileting, and psychosocial support Can the patient actively participate in an intensive therapy program of at least 3 hrs of therapy 5 days a week? Yes The potential for patient to make measurable gains while on inpatient rehab is excellent Anticipated functional outcomes upon discharge from inpatient rehab: modified independent PT, modified independent OT, n/a SLP Estimated rehab length of stay to reach the above functional goals is: 10-12 Anticipated  discharge destination: Home 10. Overall Rehab/Functional Prognosis: excellent   MD Signature: Jennye Boroughs

## 2022-02-28 NOTE — Progress Notes (Signed)
Inpatient Rehab Admissions Coordinator:    I have insurance approval and a bed available for pt to admit to CIR today. Dr. Mahala Menghini in agreement.  Will let pt/family and TOC team know. I will arrange [facility] transport once bed is assigned.    Estill Dooms, PT, DPT Admissions Coordinator [phone] 02/28/22  10:37 AM

## 2022-02-28 NOTE — H&P (Addendum)
Physical Medicine and Rehabilitation Admission H&P    No chief complaint on file. : HPI: Alyssa Blanchard is a 51 year old right-handed female with history of  chronic anemia, lumbar radiculopathy, fibromyalgia, morbid obesity BMI 86.80 with gastric sleeve 2016 and nonadherence to diet, atrial fibrillation maintained on Eliquis as well as Tikosyn with limited cardiology follow-up Dr. 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 She reports she has not used any psychiatric medications in a about 2 years.  Patient recently admitted in October for recurrent episodes of congestive heart failure discharged to skilled nursing facility/[facility] for 4 weeks and then discharged to home.  Per chart review patient lives with her mother.  1 level home with 4 steps to entry.  Mother is limited physically.  Patient sleeps in a recliner and mobilizes with a rolling walker.  Presented to [facility] hospital 02/16/2022 with increasing swelling of lower extremities and recent fall with complaints of tenderness of the right hip and erythema posterior right leg and knee.  Admission chemistries BNP 120.5, sodium 134, glucose 138, hemoglobin 8.7, WBC 13,000, lactic acid 2.0-2.4, urinalysis negative.  X-rays of right hip and leg showed no evidence of fracture or dislocation.  EKG showed A-fib with rate 82.  Placed on intravenous Ancef for cellulitis right proximal lower extremity and completed 5-day course.  She remains on chronic Lyrica for lumbar radiculopathy as well as oxycodone as needed.  She reports continued pain and numbness in R thigh.  She also has bilateral hip soreness she attributes to walking with therapy. MRI not completed due to body habitus. She reports the cellulitis redness has  resolved. She would like it noted that she see her cardiologist Dr. Carlis Stable regularly. Therapy evaluations completed due to patient's debility decreased functional mobility was admitted for a comprehensive rehab program.  Review of Systems  Constitutional:  Negative for chills and fever.  HENT:  Negative for hearing loss.   Eyes:  Positive for blurred vision. Negative for double vision.  Respiratory:  Negative for cough and wheezing.        Shortness of breath with exertion  Cardiovascular:  Positive for palpitations and leg swelling. Negative for chest pain.  Gastrointestinal:  Positive for constipation. Negative for heartburn, nausea and vomiting.       GERD  Genitourinary:  Negative for dysuria, flank pain and hematuria.  Musculoskeletal:  Positive for back pain, falls and myalgias.  Skin:  Negative for rash.  Neurological:  Positive for tremors, weakness and headaches.  Psychiatric/Behavioral:  Positive for depression. The patient has insomnia.        Anxiety/bipolar disorder  All other systems reviewed and are negative.  Past Medical History:  Diagnosis Date   Anxiety    Asthma    Atrial fibrillation ([place])    Depression    DM (diabetes mellitus), type 2 ([place]) 02/16/2022   Dysrhythmia    new onset Afib rvr   GERD (gastroesophageal reflux disease)    Heart failure (HCC)    Heel spur    bilat   History of bronchitis    History of chicken pox    History of urinary tract infection    Migraines    Plantar fasciitis    bilat   STD (sexually transmitted disease)    chl hx &  hsv 1&2   Tremors of nervous system    Urinary incontinence    Past Surgical History:  Procedure Laterality Date   CARDIOVERSION N/A 08/05/2019   Procedure: CARDIOVERSION;  Surgeon: Nahser, Wonda Cheng, MD;  Location: [place];  Service: Cardiovascular;  Laterality: N/A;   [place] N/A 11/27/2014   Procedure: LAPAROSCOPIC GASTRIC SLEEVE RESECTION WITH HIATAL HERNIA REPAIR  UPPER ENDOSCOPY;  Surgeon: Johnathan Hausen, MD;  Location: [facility];  Service: General;  Laterality: N/A;   TEE WITHOUT CARDIOVERSION N/A 08/05/2019   Procedure: TRANSESOPHAGEAL ECHOCARDIOGRAM (TEE);  Surgeon: Acie Fredrickson, Wonda Cheng, MD;  Location: [facility] ENDOSCOPY;  Service: Cardiovascular;  Laterality: N/A;   TONSILLECTOMY  1978   Family History  Problem Relation Age of Onset   Diabetes Mother    Hypertension Mother    Hyperlipidemia Mother    Kidney disease Mother        CKD stage 4   Cancer Father        pancreatic   Hypertension Maternal Grandmother    Diabetes Maternal Grandmother    Heart failure Maternal Grandmother        CHF   Heart attack Maternal Grandfather    Hypertension Maternal Grandfather    Hypertension Paternal Grandmother    Alzheimer's disease Paternal Grandmother    Hypertension Paternal Grandfather    Cancer Maternal Uncle        melanoma   Cancer Paternal Uncle        kidney   Social History:  reports that she quit smoking about 9 years ago. Her smoking use included cigarettes. She has a 1.25 pack-year smoking history. She has never used smokeless tobacco. She reports that she does not currently use alcohol. She reports that she does not currently use drugs after having used the following drugs: Marijuana and Cocaine. Allergies:  Allergies  Allergen Reactions   Gabapentin Anaphylaxis   Topiramate Other (See Comments)    Jaw tightness and chest discomfort    Advair Diskus [Fluticasone-Salmeterol]    Imitrex [Sumatriptan] Other (See Comments)    Increased heart rate   Latex Itching   Montelukast Other (See Comments)    Nightmares    Other Itching and Other (See Comments)    Pt states "no narcotics ever" 04/13/2019- denies this entry in 01/2020 "Mental health meds, in general, don't agree with me" Plastic and artificial Christmas trees = Itching   Prozac [Fluoxetine] Other (See Comments)    Bad, vivid dreams   Trazodone And Nefazodone Other (See Comments)     Bad dreams 01/27/20 Pt is unsure if allergic to Nefazodone   Copper-Containing Compounds Rash and Other (See Comments)    Makes the face burn, also   Medications Prior to Admission  Medication Sig Dispense Refill   acetaminophen (TYLENOL) 500 MG tablet Take 1,000 mg by mouth every 6 (six) hours as needed for mild pain.     Acetylcysteine 600 MG CAPS Take by mouth. (Patient not taking: Reported on 02/16/2022)     apixaban (ELIQUIS) 5 MG TABS tablet Take 1 tablet (5 mg total) by mouth 2 (two) times daily. 60 tablet 0   bumetanide (BUMEX) 2 MG tablet Take 2 mg by mouth daily.     busPIRone (BUSPAR) 10 MG tablet Take 5-10 mg by mouth See admin instructions. Taking 10 mg twice daily and can take 5 mg in the afternoon if needed for anxiety.     dofetilide (TIKOSYN) 500 MCG capsule Take 500 mcg by mouth 2 (two)  times daily.     Dupilumab (DUPIXENT) 300 MG/2ML SOPN Inject 300 mg into the skin every 14 (fourteen) days.     fluticasone furoate-vilanterol (BREO ELLIPTA) 200-25 MCG/ACT AEPB Inhale 1 puff into the lungs daily.     glipiZIDE (GLUCOTROL) 10 MG tablet Take 10 mg by mouth 2 (two) times daily before a meal.     levalbuterol (XOPENEX HFA) 45 MCG/ACT inhaler Inhale 2 puffs into the lungs every 6 (six) hours as needed for wheezing or shortness of breath.     [START ON 03/01/2022] losartan (COZAAR) 25 MG tablet Take 1 tablet (25 mg total) by mouth daily.     Melatonin 10 MG CAPS Take 10 mg by mouth daily.     Multiple Vitamins-Minerals (BARIATRIC MULTIVITAMINS/IRON PO) Take 1 tablet by mouth daily.     nystatin (MYCOSTATIN/NYSTOP) powder Apply topically 2 (two) times daily. (Patient taking differently: Apply 1 Application topically 2 (two) times daily as needed (skin irritation).) 15 g 0   oxyCODONE (OXY IR/ROXICODONE) 5 MG immediate release tablet Take 1 tablet (5 mg total) by mouth every 6 (six) hours as needed for breakthrough pain. 30 tablet 0   pantoprazole (PROTONIX) 40 MG tablet Take 1 tablet  (40 mg total) by mouth 2 (two) times daily. 60 tablet 3   polyethylene glycol powder (GLYCOLAX/MIRALAX) 17 GM/SCOOP powder Take 17 g by mouth daily as needed for mild constipation.     pregabalin (LYRICA) 100 MG capsule Take 100 mg by mouth daily.     promethazine (PHENERGAN) 12.5 MG tablet Take 12.5 mg by mouth every 6 (six) hours as needed for nausea or vomiting.     propranolol (INDERAL) 80 MG tablet Take 80 mg by mouth 3 (three) times daily.     Semaglutide,0.25 or 0.5MG /DOS, (OZEMPIC, 0.25 OR 0.5 MG/DOSE,) 2 MG/3ML SOPN Inject 0.25 mg into the skin once a week. Wednesday ( 02-19-22) is the last dose will increase to 0.5 mg weekly.     silver sulfADIAZINE (SILVADENE) 1 % cream Apply 1 Application topically daily.     simethicone (MYLICON) 80 MG chewable tablet Chew 80 mg by mouth every 6 (six) hours as needed for flatulence.     spironolactone (ALDACTONE) 25 MG tablet Take 25 mg by mouth daily.     triamcinolone cream (KENALOG) 0.1 % Apply 1 Application topically 2 (two) times daily.     Vitamin D, Ergocalciferol, (DRISDOL) 1.25 MG (50000 UNIT) CAPS capsule Take 50,000 Units by mouth once a week. Monday     VOLTAREN ARTHRITIS PAIN 1 % GEL Apply 2 g topically 4 (four) times daily as needed (knee pain).     Home: Home Living Family/patient expects to be discharged to:: Skilled nursing facility Living Arrangements: Parent Available Help at Discharge: Family Type of Home: House Home Access: Stairs to enter Technical brewer of Steps: 4+1 Entrance Stairs-Rails: Left, Right Home Layout: One level Bathroom Shower/Tub: Chiropodist: Handicapped height Bathroom Accessibility: Yes Home Equipment: Conservation officer, nature (2 wheels), Shower seat Additional Comments: Pt reports she lives with her mom. her mother is in poor health as well and has stage IV CKD, pt's mother can not help pt. they also have a cat who the pt's mom cares for.   Functional History: Prior Function Prior  Level of Function : Independent/Modified Independent Mobility Comments: pt sleeps in a recliner and mobilizes in home with RW, reports none of her DME is bariatric. ADLs Comments: pt reports she showers on good days but it  is a long task. batrhoom has a shower seat but not grab bars and she has to step into/out of a tub-shower.   Functional Status:  Mobility: Bed Mobility Overal bed mobility: Needs Assistance Bed Mobility: Supine to Sit Rolling: Min assist Supine to sit: Min guard Sit to supine: Min guard, Min assist General bed mobility comments: increased time and heavy use of rails.  difficulty self scooting. Transfers Overall transfer level: Needs assistance Equipment used: Rolling walker (2 wheels) Transfers: Sit to/from Stand Sit to Stand: Supervision, Min guard General transfer comment: to/from EOB and elevated commode. min guard for safety. no physical assist but caution due to B knee pain Ambulation/Gait Ambulation/Gait assistance: Min guard, Min assist Gait Distance (Feet): 24 Feet (12 feet x 2 to and from bathroom) Assistive device: Rolling walker (2 wheels) Gait Pattern/deviations: Step-to pattern, Wide base of support, Decreased step length - left, Decreased step length - right General Gait Details: increased c/o B knee pain and stiffness.  Decreased amb distance 12 feet x 2 to and from bathroom with increased time and much lean on walker. Gait velocity: decreased   ADL: ADL Overall ADL's : Needs assistance/impaired Eating/Feeding: Modified independent, Sitting Grooming: Set up, Bed level Upper Body Bathing: Bed level, Moderate assistance Lower Body Bathing: Bed level, Total assistance Upper Body Dressing : Bed level, Maximal assistance Lower Body Dressing: Bed level, Total assistance Toilet Transfer: +2 for physical assistance, +2 for safety/equipment Toilet Transfer Details (indicate cue type and reason): patient was able to roll with +2 assistance in bed with use  od bed rails with cues for proper positioning. KREG bed being ordered to make it safe for patient to transiton to EOB Toileting- Clothing Manipulation and Hygiene: Bed level, Total assistance Tub/ Shower Transfer: Moderate assistance, Ambulation, Rolling walker (2 wheels), Shower seat Functional mobility during ADLs: Min guard, Rolling walker (2 wheels) General ADL Comments: Pt reports she is concerned about being able to navigate stairs at home.   Cognition: Cognition Overall Cognitive Status: Within Functional Limits for tasks assessed Orientation Level: Oriented X4 Cognition Arousal/Alertness: Awake/alert Behavior During Therapy: WFL for tasks assessed/performed Overall Cognitive Status: Within Functional Limits for tasks assessed General Comments: AxO x 3 very pleasant Lady and willing.  Lives at home with her Mother.      Physical Exam: Blood pressure (!) 119/46, pulse 77, temperature 97.7 F (36.5 C), resp. rate 18, SpO2 100 %.    General: No apparent distress HEENT: Head is normocephalic, atraumatic, PERRLA, EOMI, sclera anicteric, oral mucosa pink and moist, dentition intact Neck: Supple without JVD or lymphadenopathy Heart: Reg rate and rhythm. No murmurs rubs or gallops Chest: CTA bilaterally without wheezes, rales, or rhonchi; no distress Abdomen: Soft, non-tender, non-distended, bowel sounds positive. Obese  Extremities: No clubbing, cyanosis, Swelling in b/l LE Psych: Pt's affect is appropriate. Pt is cooperative. Pleasant Skin: Clean and intact without signs of breakdown Neuro:  Patient is alert and awake.  Makes eye contact with examiner and follows commands.  Provides name and age.  Fair insight and awareness. FNT intact.  Strength 5/5 in b/l UE and LLE Strength  4-/5 proximal RLE to 4+/5 distal RLE Altered sensation L thigh  Musculoskeletal: Tenderness at b/l hips, no joint swelling noted    Results for orders placed or performed during the hospital encounter  of 02/28/22 (from the past 48 hour(s))  Glucose, capillary     Status: Abnormal   Collection Time: 02/28/22  4:26 PM  Result Value Ref Range  Glucose-Capillary 132 (H) 70 - 99 mg/dL    Comment: Glucose reference range applies only to samples taken after fasting for at least 8 hours.   No results found.    Blood pressure (!) 119/46, pulse 77, temperature 97.7 F (36.5 C), resp. rate 18, SpO2 100 %.  Medical Problem List and Plan: 1. Functional deficits secondary to debility/morbid obesity/right lower extremity cellulitis  -patient may  shower  -ELOS/Goals: 10-12 days  -Admit to CIR PT/OT 2.  Antithrombotics: -DVT/anticoagulation:  Pharmaceutical: Eliquis  -antiplatelet therapy: N/A 3. Pain Management: Lyrica 100 mg twice daily, oxycodone as needed, 4. Mood/Behavior/Sleep: BuSpar 10 mg twice daily  -antipsychotic agents: Provide emotional support  -Hx of bipolar 1 disorder  -DC atarax as she reports this caused Qtc/cardiac issues in past 5. Neuropsych/cognition: This patient is capable of making decisions on her own behalf. 6. Skin/Wound Care: Routine skin checks 7. Fluids/Electrolytes/Nutrition: Routine in and outs with follow-up chemistries 8.  Right leg cellulitis.  Completing course 5 days Ancef. 9.  Chronic combined CHF.  Bumex 2 mg daily, Aldactone 25 mg daily.,  Cozaar 25 mg daily. monitor for any fluid overload. 10.  Atrial fibrillation/prolonged QT  Continue Eliquis 5 mg twice daily, Tikosyn 500 mg twice daily, Inderal 80 mg 3 times daily 11.  Diabetes mellitus.  Hemoglobin A1c 6.1.  SSI.  Consider restart Glucotrol 10 mg twice daily. Prior use of Ozempic/Dupixent? Resume as needed needed 12.  Morbid obesity.  BMI 86.80.  Dietary follow-up.  History of gastric sleeve 2016 and nonadherence to diet. 13.  GERD.  Protonix 14.  Iron deficiency anemia.  Follow-up CBC 15. Constipation  -She reports she had Bm about 2 days ago. She would like to hold off on constipation  medication today.  16. R chronic thigh pain. Reported to have hx of lumbar radiculopathy. Pain in this area could also be lateral femoral cutaneous nerve irritation. Continue lyrica.    Lavon Paganini Angiulli, PA-C 02/28/2022  I have personally performed a face to face diagnostic evaluation of this patient and formulated the key components of the plan.  Additionally, I have personally reviewed laboratory data, imaging studies, as well as relevant notes and concur with the physician assistant's documentation above.  The patient's status has not changed from the original H&P.  Any changes in documentation from the acute care chart have been noted above.  Jennye Boroughs, MD, [facility]

## 2022-02-28 NOTE — Progress Notes (Signed)
Report called to nurse at 4W room 26.

## 2022-03-01 DIAGNOSIS — R5381 Other malaise: Secondary | ICD-10-CM | POA: Diagnosis not present

## 2022-03-01 LAB — GLUCOSE, CAPILLARY
Glucose-Capillary: 120 mg/dL — ABNORMAL HIGH (ref 70–99)
Glucose-Capillary: 124 mg/dL — ABNORMAL HIGH (ref 70–99)
Glucose-Capillary: 120 mg/dL — ABNORMAL HIGH (ref 70–99)
Glucose-Capillary: 137 mg/dL — ABNORMAL HIGH (ref 70–99)

## 2022-03-01 MED ORDER — MAGNESIUM CHLORIDE 64 MG PO TBEC
1.0000 | DELAYED_RELEASE_TABLET | Freq: Every day | ORAL | Status: DC
  Administered 2022-03-01 – 2022-03-11 (×10): 64 mg via ORAL
  Filled 2022-03-01 (×11): qty 1

## 2022-03-01 MED ORDER — OXYCODONE HCL 5 MG PO TABS
5.0000 mg | ORAL_TABLET | ORAL | Status: DC | PRN
  Administered 2022-03-01 – 2022-03-11 (×45): 5 mg via ORAL
  Filled 2022-03-01 (×47): qty 1

## 2022-03-01 NOTE — Progress Notes (Signed)
PROGRESS NOTE   Subjective/Complaints: She has significant leg pain this morning- asks if oxycodone can be increased to q4H prn and I have made this change Slow mag added for constipation  ROS: +lower extremity pain   Objective:   No results found. No results for input(s): "WBC", "HGB", "HCT", "PLT" in the last 72 hours. Recent Labs    02/28/22 0331  NA 132*  K 4.2  CL 95*  CO2 27  GLUCOSE 122*  BUN 16  CREATININE 1.05*  CALCIUM 9.0    Intake/Output Summary (Last 24 hours) at 03/01/2022 2151 Last data filed at 03/01/2022 1840 Gross per 24 hour  Intake 480 ml  Output --  Net 480 ml        Physical Exam: Vital Signs Blood pressure (!) 111/51, pulse 70, temperature (!) 97.5 F (36.4 C), temperature source Oral, resp. rate 18, SpO2 96 %. Gen: no distress, normal appearing HEENT: oral mucosa pink and moist, NCAT Cardio: Reg rate Chest: normal effort, normal rate of breathing Abd: soft, non-distended Ext: no edema Psych: pleasant, normal affect Skin: intact Neuro:  Patient is alert and awake.  Makes eye contact with examiner and follows commands.  Provides name and age.  Fair insight and awareness. FNT intact.  Strength 5/5 in b/l UE and LLE Strength  4-/5 proximal RLE to 4+/5 distal RLE Altered sensation L thigh  Musculoskeletal: Tenderness at b/l hips, no joint swelling noted     Assessment/Plan: 1. Functional deficits which require 3+ hours per day of interdisciplinary therapy in a comprehensive inpatient rehab setting. Physiatrist is providing close team supervision and 24 hour management of active medical problems listed below. Physiatrist and rehab team continue to assess barriers to discharge/monitor patient progress toward functional and medical goals  Care Tool:  Bathing    Body parts bathed by patient: Right arm, Left arm, Chest, Front perineal area, Face, Abdomen, Left upper leg, Right  upper leg   Body parts bathed by helper: Right lower leg, Left lower leg, Buttocks     Bathing assist Assist Level: Moderate Assistance - Patient 50 - 74%     Upper Body Dressing/Undressing Upper body dressing   What is the patient wearing?: Pull over shirt    Upper body assist Assist Level: Contact Guard/Touching assist    Lower Body Dressing/Undressing Lower body dressing      What is the patient wearing?: Pants     Lower body assist Assist for lower body dressing: Moderate Assistance - Patient 50 - 74%     Toileting Toileting    Toileting assist Assist for toileting: Moderate Assistance - Patient 50 - 74%     Transfers Chair/bed transfer  Transfers assist     Chair/bed transfer assist level: Contact Guard/Touching assist     Locomotion Ambulation   Ambulation assist      Assist level: Minimal Assistance - Patient > 75% Assistive device: Walker-rolling Max distance: 35   Walk 10 feet activity   Assist     Assist level: Contact Guard/Touching assist Assistive device: Walker-rolling   Walk 50 feet activity   Assist Walk 50 feet with 2 turns activity did not occur: Safety/medical concerns (fatigue)  Walk 150 feet activity   Assist Walk 150 feet activity did not occur: Safety/medical concerns (fatigue)         Walk 10 feet on uneven surface  activity   Assist Walk 10 feet on uneven surfaces activity did not occur: Safety/medical concerns (fatigue)         Wheelchair     Assist Is the patient using a wheelchair?: Yes Type of Wheelchair: Manual    Wheelchair assist level: Minimal Assistance - Patient > 75% Max wheelchair distance: 10    Wheelchair 50 feet with 2 turns activity    Assist        Assist Level: Dependent - Patient 0%   Wheelchair 150 feet activity     Assist      Assist Level: Dependent - Patient 0%   Blood pressure (!) 111/51, pulse 70, temperature (!) 97.5 F (36.4 C),  temperature source Oral, resp. rate 18, SpO2 96 %.  Medical Problem List and Plan: 1. Functional deficits secondary to debility/morbid obesity/right lower extremity cellulitis             -patient may  shower             -ELOS/Goals: 10-12 days             -Admit to CIR PT/OT 2.  Antithrombotics: -DVT/anticoagulation:  Pharmaceutical: Eliquis             -antiplatelet therapy: N/A 3. Lower extremity pain: Lyrica 100 mg twice daily, oxycodone as needed- increased to q4H prn 4. Mood/Behavior/Sleep: BuSpar 10 mg twice daily             -antipsychotic agents: Provide emotional support             -Hx of bipolar 1 disorder             -DC atarax as she reports this caused Qtc/cardiac issues in past 5. Neuropsych/cognition: This patient is capable of making decisions on her own behalf. 6. Skin/Wound Care: Routine skin checks 7. Fluids/Electrolytes/Nutrition: Routine in and outs with follow-up chemistries 8.  Right leg cellulitis.  Completing course 5 days Ancef. 9.  Chronic combined CHF.  Bumex 2 mg daily, Aldactone 25 mg daily.,  Cozaar 25 mg daily. monitor for any fluid overload. 10.  Atrial fibrillation/prolonged QT  Continue Eliquis 5 mg twice daily, Tikosyn 500 mg twice daily, Inderal 80 mg 3 times daily 11.  Diabetes mellitus.  Hemoglobin A1c 6.1.  SSI.  Consider restart Glucotrol 10 mg twice daily. Prior use of Ozempic/Dupixent? Resume as needed needed 12.  Morbid obesity.  BMI 86.80.  Dietary follow-up.  History of gastric sleeve 2016 and nonadherence to diet. 13.  GERD.  Protonix 14.  Iron deficiency anemia.  Follow-up CBC 15. Constipation: add slow mag 16. R chronic thigh pain. Reported to have hx of lumbar radiculopathy. Pain in this area could also be lateral femoral cutaneous nerve irritation. Continue lyrica. 17. Hypotension/AKI: d/c Losartan LOS: 1 days A FACE TO FACE EVALUATION WAS PERFORMED  Clide Deutscher Elexia Friedt 03/01/2022, 9:51 PM

## 2022-03-01 NOTE — Plan of Care (Signed)
  Problem: RH Balance Goal: LTG Patient will maintain dynamic standing with ADLs (OT) Description: LTG:  Patient will maintain dynamic standing balance with assist during activities of daily living (OT)  Flowsheets (Taken 03/01/2022 1638) LTG: Pt will maintain dynamic standing balance during ADLs with: Independent with assistive device   Problem: Sit to Stand Goal: LTG:  Patient will perform sit to stand in prep for activites of daily living with assistance level (OT) Description: LTG:  Patient will perform sit to stand in prep for activites of daily living with assistance level (OT) Flowsheets (Taken 03/01/2022 1638) LTG: PT will perform sit to stand in prep for activites of daily living with assistance level: Independent with assistive device   Problem: RH Bathing Goal: LTG Patient will bathe all body parts with assist levels (OT) Description: LTG: Patient will bathe all body parts with assist levels (OT) Flowsheets (Taken 03/01/2022 1638) LTG: Pt will perform bathing with assistance level/cueing: Supervision/Verbal cueing   Problem: RH Dressing Goal: LTG Patient will perform lower body dressing w/assist (OT) Description: LTG: Patient will perform lower body dressing with assist, with/without cues in positioning using equipment (OT) Flowsheets (Taken 03/01/2022 1638) LTG: Pt will perform lower body dressing with assistance level of: Supervision/Verbal cueing   Problem: RH Toileting Goal: LTG Patient will perform toileting task (3/3 steps) with assistance level (OT) Description: LTG: Patient will perform toileting task (3/3 steps) with assistance level (OT)  Flowsheets (Taken 03/01/2022 1638) LTG: Pt will perform toileting task (3/3 steps) with assistance level: Supervision/Verbal cueing   Problem: RH Simple Meal Prep Goal: LTG Patient will perform simple meal prep w/assist (OT) Description: LTG: Patient will perform simple meal prep with assistance, with/without cues  (OT). Flowsheets (Taken 03/01/2022 1638) LTG: Pt will perform simple meal prep with assistance level of: Supervision/Verbal cueing   Problem: RH Laundry Goal: LTG Patient will perform laundry w/assist, cues (OT) Description: LTG: Patient will perform laundry with assistance, with/without cues (OT). Flowsheets (Taken 03/01/2022 1638) LTG: Pt will perform laundry with assistance level of: Supervision/Verbal cueing LTG: Pt will perform laundry with level of: Ambulate with device   Problem: RH Toilet Transfers Goal: LTG Patient will perform toilet transfers w/assist (OT) Description: LTG: Patient will perform toilet transfers with assist, with/without cues using equipment (OT) Flowsheets (Taken 03/01/2022 1638) LTG: Pt will perform toilet transfers with assistance level of: Independent with assistive device   Problem: RH Tub/Shower Transfers Goal: LTG Patient will perform tub/shower transfers w/assist (OT) Description: LTG: Patient will perform tub/shower transfers with assist, with/without cues using equipment (OT) Flowsheets (Taken 03/01/2022 1638) LTG: Pt will perform tub/shower stall transfers with assistance level of: Independent with assistive device

## 2022-03-01 NOTE — Progress Notes (Signed)
Pt refused CPAP at this time. Will continue to monitor.

## 2022-03-01 NOTE — Evaluation (Signed)
Physical Therapy Assessment and Plan  Patient Details  Name: Alyssa Blanchard MRN: [id number] Date of Birth: Oct 08, 1970  PT Diagnosis: Abnormality of gait, Difficulty walking, and Pain in RLE Rehab Potential: Good ELOS: 10-14 days   Today's Date: 03/01/2022 PT Individual Time:  Session 1: 3016-0109    Session 2: 3235-5732 PT Individual Time Calculation (min):  Session 1: 74 min         Session 2: 13 min  Hospital Problem: Principal Problem:   Debility   Past Medical History:  Past Medical History:  Diagnosis Date   Anxiety    Asthma    Atrial fibrillation ([place])    Depression    DM (diabetes mellitus), type 2 ([place]) 02/16/2022   Dysrhythmia    new onset Afib rvr   GERD (gastroesophageal reflux disease)    Heart failure (H[facility])    Heel spur    bilat   History of bronchitis    History of chicken pox    History of urinary tract infection    Migraines    Plantar fasciitis    bilat   STD (sexually transmitted disease)    chl hx & hsv 1&2   Tremors of nervous system    Urinary incontinence    Past Surgical History:  Past Surgical History:  Procedure Laterality Date   CARDIOVERSION N/A 08/05/2019   Procedure: CARDIOVERSION;  Surgeon: Nahser, Wonda Cheng, MD;  Location: [place];  Service: Cardiovascular;  Laterality: N/A;   LAPAROSCOPIC GASTRIC SLEEVE RESECTION N/A 11/27/2014   Procedure: LAPAROSCOPIC GASTRIC SLEEVE RESECTION WITH HIATAL HERNIA REPAIR UPPER ENDOSCOPY;  Surgeon: Johnathan Hausen, MD;  Location: [facility];  Service: General;  Laterality: N/A;   TEE WITHOUT CARDIOVERSION N/A 08/05/2019   Procedure: TRANSESOPHAGEAL ECHOCARDIOGRAM (TEE);  Surgeon: Thayer Headings, MD;  Location: [facility] ENDOSCOPY;  Service: Cardiovascular;  Laterality: N/A;   TONSILLECTOMY  1978    Assessment & Plan Clinical Impression: Patient is a 51 year old right-handed female with history of  chronic anemia, lumbar radiculopathy, fibromyalgia, morbid obesity BMI 86.80 with gastric sleeve 2016 and  nonadherence to diet, atrial fibrillation maintained on Eliquis as well as Tikosyn with limited cardiology follow-up Dr. 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 She reports she has not used any psychiatric medications in a about 2 years.  Patient recently admitted in October for recurrent episodes of congestive heart failure discharged to skilled nursing facility/[facility] for 4 weeks and then discharged to home.  Per chart review patient lives with her mother.  1 level home with 4 steps to entry.  Mother is limited physically.  Patient sleeps in a recliner and mobilizes with a rolling walker.  Presented to [facility] 02/16/2022 with increasing swelling of lower extremities and recent fall with complaints of tenderness of the right hip and erythema posterior right leg and knee.  Admission chemistries BNP 120.5, sodium 134, glucose 138, hemoglobin 8.7, WBC 13,000, lactic acid 2.0-2.4, urinalysis negative.  X-rays of right hip and leg showed no evidence of fracture or dislocation.  EKG showed A-fib with rate 82.  Placed on intravenous Ancef for cellulitis right proximal lower extremity and completed 5-day course.  She remains on chronic Lyrica for lumbar radiculopathy as well as oxycodone as needed.  She reports continued pain and numbness in R thigh.  She  also has bilateral hip soreness she attributes to walking with therapy. MRI not completed due to body habitus. She reports the cellulitis redness has resolved. She would like it noted that she see her cardiologist Dr. Carlis Stable regularly. Therapy evaluations completed due to patient's debility decreased functional mobility was admitted for a comprehensive rehab program. Patient transferred to CIR on 02/28/2022 .    Patient currently requires min with mobility secondary to  muscle pain and muscle weakness in RLE .  Prior to hospitalization, patient was modified independent  with mobility and lived with Family in a House home.  Home access is 4Stairs to enter.  Patient will benefit from skilled PT intervention to maximize safe functional mobility, minimize fall risk, and decrease caregiver burden for planned discharge home with 24 hour supervision.  Anticipate patient will benefit from follow up [place] at discharge.  PT - End of Session Activity Tolerance: Tolerates 30+ min activity with multiple rests Endurance Deficit: Yes Endurance Deficit Description: HF PT Assessment Rehab Potential (ACUTE/IP ONLY): Good PT Barriers to Discharge: Home environment access/layout;Weight PT Barriers to Discharge Comments: 4 STE PT Patient demonstrates impairments in the following area(s): Balance;Endurance;Pain;Safety PT Transfers Functional Problem(s): Bed to Chair;Car PT Locomotion Functional Problem(s): Ambulation;Stairs PT Plan PT Intensity: Minimum of 1-2 x/day ,45 to 90 minutes PT Frequency: 5 out of 7 days PT Duration Estimated Length of Stay: 10-14 days PT Treatment/Interventions: Ambulation/gait training;Balance/vestibular training;Discharge planning;Community reintegration;DME/adaptive equipment instruction;Functional mobility training;Pain management;Patient/family education;Stair training;Therapeutic Activities;Therapeutic Exercise;UE/LE Strength taining/ROM PT Transfers Anticipated Outcome(s): ModI PT Locomotion Anticipated Outcome(s): Supervision PT Recommendation Follow Up Recommendations: Home health PT Patient destination: Home Equipment Recommended: Other (comment);To be determined (Bariatric RW; WC TBD)   PT Evaluation Precautions/Restrictions Precautions Precautions: Fall Restrictions Weight Bearing Restrictions: No  Pain Assessment Pain Score: 3  Pain Interference   Home Living/Prior  Functioning Home Living Available Help at Discharge: Family Type of Home: House Home Access: Stairs to enter Technical brewer of Steps: 4 Entrance Stairs-Rails: Left;Right Home Layout: One level Bathroom Shower/Tub: Chiropodist: Handicapped height Bathroom Accessibility: Yes Additional Comments: Pt reports she lives with her mom. her mother is in poor health as well and has stage IV CKD, pt's mother can not help pt. they also have a cat who the pt's mom cares for.  Lives With: Family Prior Function Level of Independence: Requires assistive device for independence  Able to Take Stairs?: Yes Driving: No (Pt reports she has not driven for the past year d/t challenges getting in/out of car) Vocation: Unemployed Leisure: Hobbies-yes (Comment) Vision/Perception  Vision - History Ability to See in Adequate Light: 1 Impaired Vision - Assessment Eye Alignment: Within Functional Limits Alignment/Gaze Preference: Within Defined Limits Tracking/Visual Pursuits: Able to track stimulus in all quads without difficulty Saccades: Within functional limits Convergence: Within functional limits Perception Perception: Within Functional Limits Praxis Praxis: Intact   Motor  Motor Motor: Within Functional Limits      RLE Assessment RLE Assessment: Exceptions to [facility] Strength Comments: 3+/5 gross, limited by pain LLE Assessment LLE Assessment: Within Functional Limits  Care Tool Care Tool Bed Mobility Roll left and right activity   Roll left and right assist level: Independent with assistive device Roll left and right assistive device comment: uses bed features  Sit to lying activity Sit to lying activity did not occur: N/A      Lying to sitting on side of bed activity Lying to sitting on side of bed activity did not occur: the ability to  move from lying on the back to sitting on the side of the bed with no back support.: N/A       Care Tool  Transfers Sit to stand transfer   Sit to stand assist level: Contact Guard/Touching assist    Chair/bed transfer   Chair/bed transfer assist level: Contact Guard/Touching assist     Toilet transfer   Assist Level: Contact Guard/Touching assist    Car transfer   Car transfer assist level: Contact Guard/Touching assist      Care Tool Locomotion Ambulation   Assist level: Minimal Assistance - Patient > 75% Assistive device: Walker-rolling Max distance: 35  Walk 10 feet activity   Assist level: Contact Guard/Touching assist Assistive device: Walker-rolling   Walk 50 feet with 2 turns activity Walk 50 feet with 2 turns activity did not occur: Safety/medical concerns (fatigue)      Walk 150 feet activity Walk 150 feet activity did not occur: Safety/medical concerns (fatigue)      Walk 10 feet on uneven surfaces activity Walk 10 feet on uneven surfaces activity did not occur: Safety/medical concerns (fatigue)      Stairs Stair activity did not occur: Safety/medical concerns (fatigue and pain)        Walk up/down 1 step activity Walk up/down 1 step or curb (drop down) activity did not occur: Safety/medical concerns (fatigue and pain)      Walk up/down 4 steps activity Walk up/down 4 steps activity did not occur: Safety/medical concerns (fatigue and pain)      Walk up/down 12 steps activity Walk up/down 12 steps activity did not occur: Safety/medical concerns (fatigue and pain)      Pick up small objects from floor Pick up small object from the floor (from standing position) activity did not occur: Safety/medical concerns (fatigue and pain)      Wheelchair Is the patient using a wheelchair?: Yes Type of Wheelchair: Manual   Wheelchair assist level: Minimal Assistance - Patient > 75% Max wheelchair distance: 10  Wheel 50 feet with 2 turns activity   Assist Level: Dependent - Patient 0%  Wheel 150 feet activity   Assist Level: Dependent - Patient 0%    Refer to Care  Plan for Long Term Goals  SHORT TERM GOAL WEEK 1 PT Short Term Goal 1 (Week 1): STG = LTG 2/2 LOS  Recommendations for other services: None   Skilled Therapeutic Intervention Mobility Transfers Transfers: Sit to Stand;Stand Pivot Transfers Sit to Stand: Contact Guard/Touching assist Stand Pivot Transfers: Contact Guard/Touching assist Transfer (Assistive device): Rolling walker Locomotion     Session 1: Chart reviewed and pt agreeable to therapy. Pt received semi-reclined in bed with 7/10 c/o pain in RLE. Also of note, pt needed leg rests for [facility] which PT was able to locate the RLE rest only. Session focused on evaluation and practice with functional transfers. Pt initiated session with eval as described above. Pt then completed repeated transfers between surfaces with CGA + RW. Pt also completed transfer to EOB with ModI using bed features. PT and pt discussed WC safety and pt demonstrated safe locking and instructions for positioning. Pt transferred to [facility] with CGA + RW for toileting. At end of session, pt was left seated EOB in care on NT with alarm engaged, nurse call bell and all needs in reach.  Session 2: Chart reviewed and pt agreeable to therapy. Pt received semi-reclined in bed with 7/10 c/o pain. Session focused on locomotion and WC mobility to promote home access.  Pt initiated session with ModI transfer to EOB with bed features. Pt then completed SST to WC with CGA + RW. Pt transferred to hallway for time management. In hallway, pt completed 42f +348famb with CGA + RW plus rest break and education on safe RW use. Pt then attempted WC mobility but was unable to propel WC independently and fatigued after 1014fith MinA. Pt then returned to room and completed transfer to EOB using CGA + RW. Pt stated pn increasing. Pt returned to bed with ModI + bed features. At end of session, pt was left semi-reclined in bed with alarm engaged, nurse call bell and all needs in reach.   Discharge  Criteria: Patient will be discharged from PT if patient refuses treatment 3 consecutive times without medical reason, if treatment goals not met, if there is a change in medical status, if patient makes no progress towards goals or if patient is discharged from hospital.  The above assessment, treatment plan, treatment alternatives and goals were discussed and mutually agreed upon: by patient  KirMarquette Old/23/2023, 3:52 PM

## 2022-03-01 NOTE — Plan of Care (Signed)
  Problem: Consults Goal: RH GENERAL PATIENT EDUCATION Description: See Patient Education module for education specifics. Outcome: Progressing   Problem: RH BOWEL ELIMINATION Goal: RH STG MANAGE BOWEL WITH ASSISTANCE Description: STG Manage Bowel with mod I  Assistance. Outcome: Progressing Goal: RH STG MANAGE BOWEL W/MEDICATION W/ASSISTANCE Description: STG Manage Bowel with Medication with mod I Assistance. Outcome: Progressing   Problem: RH BLADDER ELIMINATION Goal: RH STG MANAGE BLADDER WITH ASSISTANCE Description: STG Manage Bladder With mod I  Assistance Outcome: Progressing Goal: RH STG MANAGE BLADDER WITH MEDICATION WITH ASSISTANCE Description: STG Manage Bladder With Medication With mod I  Assistance. Outcome: Progressing   Problem: RH SKIN INTEGRITY Goal: RH STG SKIN FREE OF INFECTION/BREAKDOWN Description: W min assist Outcome: Progressing   Problem: RH SAFETY Goal: RH STG ADHERE TO SAFETY PRECAUTIONS W/ASSISTANCE/DEVICE Description: STG Adhere to Safety Precautions With cues Assistance/Device. Outcome: Progressing

## 2022-03-01 NOTE — Evaluation (Signed)
Occupational Therapy Assessment and Plan  Patient Details  Name: Ami Mally MRN: [id number] Date of Birth: December 10, 1970  OT Diagnosis: muscle weakness (generalized) and pain in joint Rehab Potential: Rehab Potential (ACUTE ONLY): Good ELOS: 10-14 days   Today's Date: 03/01/2022 OT Individual Time: 3235-5732 OT Individual Time Calculation (min): 85 min     Hospital Problem: Principal Problem:   Debility   Past Medical History:  Past Medical History:  Diagnosis Date   Anxiety    Asthma    Atrial fibrillation ([place])    Depression    DM (diabetes mellitus), type 2 ([place]) 02/16/2022   Dysrhythmia    new onset Afib rvr   GERD (gastroesophageal reflux disease)    Heart failure (HCC)    Heel spur    bilat   History of bronchitis    History of chicken pox    History of urinary tract infection    Migraines    Plantar fasciitis    bilat   STD (sexually transmitted disease)    chl hx & hsv 1&2   Tremors of nervous system    Urinary incontinence    Past Surgical History:  Past Surgical History:  Procedure Laterality Date   CARDIOVERSION N/A 08/05/2019   Procedure: CARDIOVERSION;  Surgeon: Nahser, Wonda Cheng, MD;  Location: [facility];  Service: Cardiovascular;  Laterality: N/A;   LAPAROSCOPIC GASTRIC SLEEVE RESECTION N/A 11/27/2014   Procedure: LAPAROSCOPIC GASTRIC SLEEVE RESECTION WITH HIATAL HERNIA REPAIR UPPER ENDOSCOPY;  Surgeon: Johnathan Hausen, MD;  Location: [facility];  Service: General;  Laterality: N/A;   TEE WITHOUT CARDIOVERSION N/A 08/05/2019   Procedure: TRANSESOPHAGEAL ECHOCARDIOGRAM (TEE);  Surgeon: Thayer Headings, MD;  Location: [facility];  Service: Cardiovascular;  Laterality: N/A;   TONSILLECTOMY  1978    Assessment & Plan Clinical Impression:  Shavonn Convey is a 51 year old right-handed female with history of  chronic anemia, lumbar radiculopathy, fibromyalgia, morbid obesity BMI 86.80 with gastric sleeve 2016 and nonadherence to diet, atrial  fibrillation maintained on Eliquis as well as Tikosyn with limited cardiology follow-up Dr. 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 She reports she has not used any psychiatric medications in a about 2 years.  Patient recently admitted in October for recurrent episodes of congestive heart failure discharged to skilled nursing facility/[facility] for 4 weeks and then discharged to home.  Per chart review patient lives with her mother.  1 level home with 4 steps to entry.  Mother is limited physically.  Patient sleeps in a recliner and mobilizes with a rolling walker.  Presented to [facility] long hospital 02/16/2022 with increasing swelling of lower extremities and recent fall with complaints of tenderness of the right hip and erythema posterior right leg and knee.  Admission chemistries BNP 120.5, sodium 134, glucose 138, hemoglobin 8.7, WBC 13,000, lactic acid 2.0-2.4, urinalysis negative.  X-rays of right hip and leg showed no evidence of fracture or dislocation.  EKG showed A-fib with rate 82.  Placed on intravenous Ancef for cellulitis right proximal lower extremity and completed 5-day course.  She remains on chronic Lyrica for lumbar radiculopathy as well as oxycodone as needed.  She reports continued pain and numbness in R thigh.  She also has bilateral hip soreness she attributes to walking with therapy. MRI not completed due to  body habitus. She reports the cellulitis redness has resolved. She would like it noted that she see her cardiologist Dr. Carlis Stable regularly. Therapy evaluations completed due to patient's debility decreased functional mobility was admitted for a comprehensive rehab program.  Patient transferred to CIR on 02/28/2022 .    Patient currently requires  min-mod A with basic self-care skills and IADL secondary to muscle weakness, decreased cardiorespiratoy endurance, and decreased sitting balance, decreased standing balance, and decreased balance strategies.  Prior to hospitalization, patient could complete BALDs with modified independent .  Patient will benefit from skilled intervention to decrease level of assist with basic self-care skills, increase independence with basic self-care skills, and increase level of independence with iADL prior to discharge home with care partner.  Anticipate patient will require intermittent supervision and no further OT follow recommended.  OT - End of Session Activity Tolerance: Decreased this session Endurance Deficit: Yes Endurance Deficit Description: HF OT Assessment Rehab Potential (ACUTE ONLY): Good OT Patient demonstrates impairments in the following area(s): Balance;Motor;Sensory;Skin Integrity;Pain;Endurance;Safety OT Basic ADL's Functional Problem(s): Grooming;Bathing;Dressing;Toileting OT Advanced ADL's Functional Problem(s): Simple Meal Preparation OT Transfers Functional Problem(s): Toilet;Tub/Shower OT Plan OT Intensity: Minimum of 1-2 x/day, 45 to 90 minutes OT Frequency: 5 out of 7 days OT Duration/Estimated Length of Stay: 10-14 days OT Treatment/Interventions: Balance/vestibular training;Community reintegration;Disease mangement/prevention;Patient/family education;Self Care/advanced ADL retraining;Therapeutic Exercise;UE/LE Coordination activities;Cognitive remediation/compensation;Discharge planning;DME/adaptive equipment instruction;Functional mobility training;Skin care/wound managment;Therapeutic Activities;UE/LE Strength taining/ROM OT Self Feeding Anticipated Outcome(s): Independent OT Basic Self-Care Anticipated Outcome(s): Mod I OT Toileting Anticipated Outcome(s): Mod I OT Bathroom Transfers Anticipated Outcome(s): Mod I OT Recommendation Patient destination: Home Follow Up  Recommendations: None Equipment Recommended: 3 in 1 bedside comode;Tub/shower bench Equipment Details: bariatric equiptment needed   OT Evaluation Precautions/Restrictions  Precautions Precautions: Fall Restrictions Weight Bearing Restrictions: No General Chart Reviewed: Yes Additional Pertinent History: chronic anemia, fibromyalgia, Afib, bipolar disorder Family/Caregiver Present: No Vital Signs Therapy Vitals Temp: 97.9 F (36.6 C) Pulse Rate: 71 Resp: 14 BP: (Abnormal) 111/56 Patient Position (if appropriate): Lying Oxygen Therapy SpO2: 94 % O2 Device: Room Air Pain   Home Living/Prior Functioning Home Living Living Arrangements: Parent Available Help at Discharge: Family Type of Home: House Home Access: Stairs to enter Technical brewer of Steps: 4 Entrance Stairs-Rails: Left, Right Home Layout: One level Bathroom Shower/Tub: Chiropodist: Handicapped height Bathroom Accessibility: Yes Additional Comments: Pt reports she lives with her mom. her mother is in poor health as well and has stage IV CKD, pt's mother can not help pt. they also have a cat who the pt's mom cares for.  Lives With: Family IADL History Homemaking Responsibilities: Yes Meal Prep Responsibility: Secondary Laundry Responsibility: Secondary Cleaning Responsibility: Secondary Bill Paying/Finance Responsibility: Secondary Shopping Responsibility: Secondary Child Care Responsibility: No Current License: Yes Mode of Transportation: Car Occupation: Unemployed Leisure and Hobbies: read, sit outside, [place], [place] Prior Function Level of Independence: Requires assistive device for independence  Able to Take Stairs?: Yes Driving: No (Pt reports she has not driven for the past year d/t challenges getting in/out of car) Vocation: Unemployed Leisure: Hobbies-yes (Comment) Vision Baseline Vision/History: 0 No visual deficits Ability to See in Adequate Light: 1  Impaired Patient Visual Report: Blurring of vision Vision Assessment?: Yes Eye Alignment: Within Functional Limits Alignment/Gaze Preference: Within Defined Limits Tracking/Visual Pursuits: Able to track stimulus in all quads without difficulty Saccades: Within functional limits Convergence: Within functional limits Visual Fields: No apparent deficits Perception  Perception: Within Functional Limits Praxis Praxis: Intact Cognition Cognition  Overall Cognitive Status: Within Functional Limits for tasks assessed Arousal/Alertness: Awake/alert Orientation Level: Person;Place;Situation Person: Oriented Place: Oriented Situation: Oriented Memory: Appears intact Awareness: Appears intact Problem Solving: Appears intact Safety/Judgment: Appears intact Brief Interview for Mental Status (BIMS) Repetition of Three Words (First Attempt): 3 Temporal Orientation: Year: Correct Temporal Orientation: Month: Accurate within 5 days Temporal Orientation: Day: Correct Recall: "Sock": Yes, no cue required Recall: "Blue": Yes, no cue required Recall: "Bed": Yes, no cue required BIMS Summary Score: 15 Sensation Sensation Light Touch: Impaired by gross assessment Light Touch Impaired Details: Impaired RLE Hot/Cold: Appears Intact Proprioception: Appears Intact Stereognosis: Appears Intact Coordination Gross Motor Movements are Fluid and Coordinated: No (Slowed BLE motor movements) Fine Motor Movements are Fluid and Coordinated: Yes Finger Nose Finger Test: slooth and controlled, no dysmetria noted Motor  Motor Motor: Within Functional Limits  Trunk/Postural Assessment  Cervical Assessment Cervical Assessment: Within Functional Limits Thoracic Assessment Thoracic Assessment: Within Functional Limits Lumbar Assessment Lumbar Assessment: Within Functional Limits Postural Control Postural Control: Deficits on evaluation Righting Reactions: decreased Protective Responses: decreased   Balance Balance Balance Assessed: Yes Static Sitting Balance Static Sitting - Balance Support: No upper extremity supported Static Sitting - Level of Assistance: 5: Stand by assistance Dynamic Sitting Balance Dynamic Sitting - Balance Support: No upper extremity supported Dynamic Sitting - Level of Assistance: 5: Stand by assistance Static Standing Balance Static Standing - Balance Support: Bilateral upper extremity supported Static Standing - Level of Assistance: 5: Stand by assistance Dynamic Standing Balance Dynamic Standing - Balance Support: Bilateral upper extremity supported Dynamic Standing - Level of Assistance: 4: Min assist;5: Stand by assistance Dynamic Standing - Balance Activities:  (BADLs) Extremity/Trunk Assessment RUE Assessment RUE Assessment: Within Functional Limits General Strength Comments: Overall decreased stength 4+/5 LUE Assessment LUE Assessment: Within Functional Limits General Strength Comments: Overall decreased strength 4+/5  Care Tool Care Tool Self Care Eating   Eating Assist Level: Set up assist    Oral Care    Oral Care Assist Level: Contact Guard/Toucning assist    Bathing   Body parts bathed by patient: Right arm;Left arm;Chest;Front perineal area;Face;Abdomen;Left upper leg;Right upper leg Body parts bathed by helper: Right lower leg;Left lower leg;Buttocks   Assist Level: Moderate Assistance - Patient 50 - 74%    Upper Body Dressing(including orthotics)   What is the patient wearing?: Pull over shirt   Assist Level: Contact Guard/Touching assist    Lower Body Dressing (excluding footwear)   What is the patient wearing?: Pants Assist for lower body dressing: Moderate Assistance - Patient 50 - 74%    Putting on/Taking off footwear   What is the patient wearing?: Lacey for footwear: Total Assistance - Patient < 25%       Care Tool Toileting Toileting activity   Assist for toileting: Moderate Assistance - Patient 50 -  74%     Care Tool Bed Mobility Roll left and right activity   Roll left and right assist level: Independent with assistive device Roll left and right assistive device comment: uses bed features  Sit to lying activity Sit to lying activity did not occur: N/A      Lying to sitting on side of bed activity Lying to sitting on side of bed activity did not occur: the ability to move from lying on the back to sitting on the side of the bed with no back support.: N/A       Care Tool Transfers Sit to stand transfer   Sit to stand  assist level: Contact Guard/Touching assist    Chair/bed transfer   Chair/bed transfer assist level: Contact Guard/Touching assist     Toilet transfer   Assist Level: Contact Guard/Touching assist     Care Tool Cognition  Expression of Ideas and Wants Expression of Ideas and Wants: 4. Without difficulty (complex and basic) - expresses complex messages without difficulty and with speech that is clear and easy to understand  Understanding Verbal and Non-Verbal Content Understanding Verbal and Non-Verbal Content: 4. Understands (complex and basic) - clear comprehension without cues or repetitions   Memory/Recall Ability Memory/Recall Ability : Current season;Location of own room;Staff names and faces;That he or she is in a hospital/hospital unit   Refer to Care Plan for Long Term Goals  SHORT TERM GOAL WEEK 1 OT Short Term Goal 1 (Week 1): pt will completed LB dressing min A using AD/AE PRN OT Short Term Goal 2 (Week 1): Pt will tolerate standing >2 minutes for fucntional activity OT Short Term Goal 3 (Week 1): Pt will verbalice 3 energy conservation techniques to utilize upon d/c home  Recommendations for other services: None    Skilled Therapeutic Intervention 1:1 OT evaluation initiated with educaiton provided on OT purpose, role, and goals discussed with Pt. Pt received resting in bed in good spirits and receptive to skilled OT evaluation and intervention  session. Pt able to completed BADLs at levels listed below. Pt transitioned from supine to EOB min A with HOB elevated and using bed features. Pt provided education on safety using RW in including hand placement, body alignment, and technique. Pt able to complete sti<>stand, stand step, and squat pivot transfers<>wc<>toilet with CGA-min A. Pt able to tolerate standing <2 minutes before requiring rest break while brushing teeth this session. Pt able to don shirt min A. Pt required mod A to weave feet into pants and bring pants to waist- would benefit from adaptive equipment trial. Pt demonstrating decreased activity tolerance and strength this session impacting BADL performance. Pt was left resting in bed with call bell in reach, bed alarm on, and all needs met.  ADL ADL Eating: Set up Where Assessed-Eating: Bed level Grooming: Contact guard Where Assessed-Grooming: Standing at sink Upper Body Bathing: Minimal assistance Lower Body Bathing: Moderate assistance Where Assessed-Lower Body Bathing: Edge of bed Upper Body Dressing: Setup Where Assessed-Upper Body Dressing: Edge of bed Lower Body Dressing: Moderate assistance Where Assessed-Lower Body Dressing: Edge of bed Toileting: Moderate assistance Where Assessed-Toileting: Bedside Commode Toilet Transfer: Therapist, music Method: Counselling psychologist: Radiographer, therapeutic: Not assessed Social research officer, government: Curator Method: Print production planner with back Mobility  Transfers Sit to Stand: Contact Guard/Touching assist;Minimal Assistance - Patient > 75%   Discharge Criteria: Patient will be discharged from OT if patient refuses treatment 3 consecutive times without medical reason, if treatment goals not met, if there is a change in medical status, if patient makes no progress towards goals or if patient is discharged from hospital.  The  above assessment, treatment plan, treatment alternatives and goals were discussed and mutually agreed upon: by patient  Janey Genta 03/01/2022, 4:27 PM

## 2022-03-01 NOTE — Plan of Care (Signed)
  Problem: Consults Goal: RH GENERAL PATIENT EDUCATION Description: See Patient Education module for education specifics. Outcome: Progressing   Problem: RH BOWEL ELIMINATION Goal: RH STG MANAGE BOWEL WITH ASSISTANCE Description: STG Manage Bowel with mod I  Assistance. Outcome: Progressing Goal: RH STG MANAGE BOWEL W/MEDICATION W/ASSISTANCE Description: STG Manage Bowel with Medication with mod I Assistance. Outcome: Progressing   Problem: RH BLADDER ELIMINATION Goal: RH STG MANAGE BLADDER WITH ASSISTANCE Description: STG Manage Bladder With mod I  Assistance Outcome: Progressing Goal: RH STG MANAGE BLADDER WITH MEDICATION WITH ASSISTANCE Description: STG Manage Bladder With Medication With mod I  Assistance. Outcome: Progressing   Problem: RH SKIN INTEGRITY Goal: RH STG SKIN FREE OF INFECTION/BREAKDOWN Description: W min assist Outcome: Progressing   Problem: RH SAFETY Goal: RH STG ADHERE TO SAFETY PRECAUTIONS W/ASSISTANCE/DEVICE Description: STG Adhere to Safety Precautions With cues Assistance/Device. Outcome: Progressing   Problem: RH PAIN MANAGEMENT Goal: RH STG PAIN MANAGED AT OR BELOW PT'S PAIN GOAL Description: < 4 with prns Outcome: Progressing   Problem: RH KNOWLEDGE DEFICIT GENERAL Goal: RH STG INCREASE KNOWLEDGE OF SELF CARE AFTER HOSPITALIZATION Description: Patient will be able to manage care at discharge using educational handouts independently Outcome: Progressing

## 2022-03-02 DIAGNOSIS — R5381 Other malaise: Secondary | ICD-10-CM | POA: Diagnosis not present

## 2022-03-02 LAB — BASIC METABOLIC PANEL
Anion gap: 10 (ref 5–15)
BUN: 15 mg/dL (ref 6–20)
CO2: 26 mmol/L (ref 22–32)
Calcium: 9.1 mg/dL (ref 8.9–10.3)
Chloride: 94 mmol/L — ABNORMAL LOW (ref 98–111)
Creatinine, Ser: 1.05 mg/dL — ABNORMAL HIGH (ref 0.44–1.00)
GFR, Estimated: >60 mL/min (ref >60)
Glucose, Bld: 115 mg/dL — ABNORMAL HIGH (ref 70–99)
Potassium: 3.7 mmol/L (ref 3.5–5.1)
Sodium: 130 mmol/L — ABNORMAL LOW (ref 135–145)

## 2022-03-02 LAB — GLUCOSE, CAPILLARY
Glucose-Capillary: 129 mg/dL — ABNORMAL HIGH (ref 70–99)
Glucose-Capillary: 116 mg/dL — ABNORMAL HIGH (ref 70–99)
Glucose-Capillary: 141 mg/dL — ABNORMAL HIGH (ref 70–99)
Glucose-Capillary: 140 mg/dL — ABNORMAL HIGH (ref 70–99)

## 2022-03-02 MED ORDER — POTASSIUM CHLORIDE CRYS ER 20 MEQ PO TBCR
60.0000 meq | EXTENDED_RELEASE_TABLET | Freq: Once | ORAL | Status: AC
  Administered 2022-03-02: 60 meq via ORAL
  Filled 2022-03-02: qty 3

## 2022-03-02 MED ORDER — SCOPOLAMINE 1 MG/3DAYS TD PT72
1.0000 | MEDICATED_PATCH | TRANSDERMAL | Status: DC
  Administered 2022-03-02 – 2022-03-05 (×2): 1.5 mg via TRANSDERMAL
  Filled 2022-03-02 (×2): qty 1

## 2022-03-02 NOTE — Progress Notes (Signed)
PROGRESS NOTE   Subjective/Complaints: She feels her pain is much improved.  Had BM yesterday Asks for something for nausea but she has prolonged QT interval.   ROS: +lower extremity pain, +nausea   Objective:   No results found. No results for input(s): "WBC", "HGB", "HCT", "PLT" in the last 72 hours. Recent Labs    02/28/22 0331  NA 132*  K 4.2  CL 95*  CO2 27  GLUCOSE 122*  BUN 16  CREATININE 1.05*  CALCIUM 9.0    Intake/Output Summary (Last 24 hours) at 03/02/2022 1139 Last data filed at 03/02/2022 0837 Gross per 24 hour  Intake 960 ml  Output --  Net 960 ml        Physical Exam: Vital Signs Blood pressure (!) 107/52, pulse 70, temperature 97.8 F (36.6 C), temperature source Oral, resp. rate 18, SpO2 94 %. Gen: no distress, normal appearing, BMI 86.80 HEENT: oral mucosa pink and moist, NCAT Cardio: Reg rate Chest: normal effort, normal rate of breathing Abd: soft, non-distended Ext: no edema Psych: pleasant, normal affect Skin: intact Neuro:  Patient is alert and awake.  Makes eye contact with examiner and follows commands.  Provides name and age.  Fair insight and awareness. FNT intact.  Strength 5/5 in b/l UE and LLE Strength  4-/5 proximal RLE to 4+/5 distal RLE Altered sensation L thigh  Musculoskeletal: Tenderness at b/l hips, no joint swelling noted     Assessment/Plan: 1. Functional deficits which require 3+ hours per day of interdisciplinary therapy in a comprehensive inpatient rehab setting. Physiatrist is providing close team supervision and 24 hour management of active medical problems listed below. Physiatrist and rehab team continue to assess barriers to discharge/monitor patient progress toward functional and medical goals  Care Tool:  Bathing    Body parts bathed by patient: Right arm, Left arm, Chest, Front perineal area, Face, Abdomen, Left upper leg, Right upper leg    Body parts bathed by helper: Right lower leg, Left lower leg, Buttocks     Bathing assist Assist Level: Moderate Assistance - Patient 50 - 74%     Upper Body Dressing/Undressing Upper body dressing   What is the patient wearing?: Pull over shirt    Upper body assist Assist Level: Contact Guard/Touching assist    Lower Body Dressing/Undressing Lower body dressing      What is the patient wearing?: Pants     Lower body assist Assist for lower body dressing: Moderate Assistance - Patient 50 - 74%     Toileting Toileting    Toileting assist Assist for toileting: Moderate Assistance - Patient 50 - 74%     Transfers Chair/bed transfer  Transfers assist     Chair/bed transfer assist level: Contact Guard/Touching assist     Locomotion Ambulation   Ambulation assist      Assist level: Minimal Assistance - Patient > 75% Assistive device: Walker-rolling Max distance: 35   Walk 10 feet activity   Assist     Assist level: Contact Guard/Touching assist Assistive device: Walker-rolling   Walk 50 feet activity   Assist Walk 50 feet with 2 turns activity did not occur: Safety/medical concerns (fatigue)  Walk 150 feet activity   Assist Walk 150 feet activity did not occur: Safety/medical concerns (fatigue)         Walk 10 feet on uneven surface  activity   Assist Walk 10 feet on uneven surfaces activity did not occur: Safety/medical concerns (fatigue)         Wheelchair     Assist Is the patient using a wheelchair?: Yes Type of Wheelchair: Manual    Wheelchair assist level: Minimal Assistance - Patient > 75% Max wheelchair distance: 10    Wheelchair 50 feet with 2 turns activity    Assist        Assist Level: Dependent - Patient 0%   Wheelchair 150 feet activity     Assist      Assist Level: Dependent - Patient 0%   Blood pressure (!) 107/52, pulse 70, temperature 97.8 F (36.6 C), temperature source Oral,  resp. rate 18, SpO2 94 %.  Medical Problem List and Plan: 1. Functional deficits secondary to debility/morbid obesity/right lower extremity cellulitis             -patient may  shower             -ELOS/Goals: 10-12 days             -Continue CIR 2.  Antithrombotics: -DVT/anticoagulation:  Pharmaceutical: Eliquis             -antiplatelet therapy: N/A 3. Lower extremity pain: Lyrica 100 mg twice daily, oxycodone as needed- increased to q4H prn 4. Mood/Behavior/Sleep: BuSpar 10 mg twice daily             -antipsychotic agents: Provide emotional support             -Hx of bipolar 1 disorder             -DC atarax as she reports this caused Qtc/cardiac issues in past 5. Neuropsych/cognition: This patient is capable of making decisions on her own behalf. 6. Skin/Wound Care: Routine skin checks 7. Fluids/Electrolytes/Nutrition: Routine in and outs with follow-up chemistries 8.  Right leg cellulitis.  Completing course 5 days Ancef. 9.  Chronic combined CHF.  Bumex 2 mg daily, Aldactone 25 mg daily.,  Cozaar 25 mg daily. monitor for any fluid overload. 10.  Atrial fibrillation/prolonged QT  Continue Eliquis 5 mg twice daily, Tikosyn 500 mg twice daily, Inderal 80 mg 3 times daily 11.  Diabetes mellitus.  Hemoglobin A1c 6.1.  SSI.  Consider restart Glucotrol 10 mg twice daily. Prior use of Ozempic/Dupixent? Resume as needed needed 12.  Morbid obesity.  BMI 86.80.  Dietary follow-up.  History of gastric sleeve 2016 and nonadherence to diet. 13.  GERD.  Protonix 14.  Iron deficiency anemia.  Follow-up CBC 15. Constipation: add slow mag. Resolved.  16. R chronic thigh pain. Reported to have hx of lumbar radiculopathy. Pain in this area could also be lateral femoral cutaneous nerve irritation. Continue lyrica. 17. Hypotension/AKI: d/c Losartan, discuss with patient's cardiologist whether spironolactone can be decreased 18. Hypokalemia: check potassium today.  19. Nausea: discussed scopolamine patch  with nurse.  LOS: 2 days A FACE TO FACE EVALUATION WAS PERFORMED  Alyssa Blanchard 03/02/2022, 11:39 AM

## 2022-03-02 NOTE — Progress Notes (Signed)
Occupational Therapy Session Note  Patient Details  Name: Alyssa Blanchard MRN: [id number] Date of Birth: 08/10/1970  Today's Date: 03/02/2022 OT Individual Time: 1300-1415 OT Individual Time Calculation (min): 75 min    Short Term Goals: Week 1:  OT Short Term Goal 1 (Week 1): pt will completed LB dressing min A using AD/AE PRN OT Short Term Goal 2 (Week 1): Pt will tolerate standing >2 minutes for fucntional activity OT Short Term Goal 3 (Week 1): Pt will verbalice 3 energy conservation techniques to utilize upon d/c home  Skilled Therapeutic Interventions/Progress Updates:    Pt received in room in bed and ready for therapy. Pt seen this session for exercises with a focus on LE mobility, activity tolerance and UE strength.  Pt worked on moving to EOB with S, sit to stands to RW with S, ambulation short distance in room bed to w/c with RW with S.  Pt had a thicker w/c and she said it made her sit to high and it was not comfortable. Found a thinner cushion that was not as wide as w/c but placed towels on each side. Pt felt this was more comfortable and she was able to scoot back in seat more easily.  Introduced a reacher to pt for donning LB clothing but pt will need more practice.   Sitting on EOB pt worked on LE AROM with sliding feet on towels and UE exercises using green theraband for tricep extensions and arm rows.   Pt stated BSC sat too high and her feet dangled. Obtained a step stool for pt to rest feet on.  Pt practiced transfer to BSC with S and did well with feet on stool. Pt returned to bed to rest. Alarm set and all needs met.   Therapy Documentation Precautions:  Precautions Precautions: Fall Restrictions Weight Bearing Restrictions: No   Vital Signs: Therapy Vitals Temp: 97.7 F (36.5 C) Pulse Rate: 69 Resp: 16 BP: (Abnormal) 111/54 Patient Position (if appropriate): Lying Oxygen Therapy SpO2: 96 % O2 Device: Room Air Pain: Pain Assessment Pain Score:  Asleep ADL: ADL Eating: Set up Where Assessed-Eating: Bed level Grooming: Contact guard Where Assessed-Grooming: Standing at sink Upper Body Bathing: Minimal assistance Lower Body Bathing: Moderate assistance Where Assessed-Lower Body Bathing: Edge of bed Upper Body Dressing: Setup Where Assessed-Upper Body Dressing: Edge of bed Lower Body Dressing: Moderate assistance Where Assessed-Lower Body Dressing: Edge of bed Toileting: Moderate assistance Where Assessed-Toileting: Bedside Commode Toilet Transfer: Contact guard Toilet Transfer Method: Ambulating Toilet Transfer Equipment: Bedside commode Tub/Shower Transfer: Not assessed Walk-In Shower Transfer: Contact guard Walk-In Shower Transfer Method: Ambulating Walk-In Shower Equipment: Shower seat with back    Therapy/Group: Individual Therapy  , 03/02/2022, 3:45 PM 

## 2022-03-02 NOTE — Plan of Care (Signed)
  Problem: Consults Goal: RH GENERAL PATIENT EDUCATION Description: See Patient Education module for education specifics. Outcome: Progressing   Problem: RH BOWEL ELIMINATION Goal: RH STG MANAGE BOWEL WITH ASSISTANCE Description: STG Manage Bowel with mod I  Assistance. Outcome: Progressing Goal: RH STG MANAGE BOWEL W/MEDICATION W/ASSISTANCE Description: STG Manage Bowel with Medication with mod I Assistance. Outcome: Progressing   Problem: RH BLADDER ELIMINATION Goal: RH STG MANAGE BLADDER WITH ASSISTANCE Description: STG Manage Bladder With mod I  Assistance Outcome: Progressing Goal: RH STG MANAGE BLADDER WITH MEDICATION WITH ASSISTANCE Description: STG Manage Bladder With Medication With mod I  Assistance. Outcome: Progressing   Problem: RH SKIN INTEGRITY Goal: RH STG SKIN FREE OF INFECTION/BREAKDOWN Description: W min assist Outcome: Progressing   Problem: RH SAFETY Goal: RH STG ADHERE TO SAFETY PRECAUTIONS W/ASSISTANCE/DEVICE Description: STG Adhere to Safety Precautions With cues Assistance/Device. Outcome: Progressing   Problem: RH PAIN MANAGEMENT Goal: RH STG PAIN MANAGED AT OR BELOW PT'S PAIN GOAL Description: < 4 with prns Outcome: Progressing   Problem: RH KNOWLEDGE DEFICIT GENERAL Goal: RH STG INCREASE KNOWLEDGE OF SELF CARE AFTER HOSPITALIZATION Description: Patient will be able to manage care at discharge using educational handouts independently Outcome: Progressing   

## 2022-03-02 NOTE — Progress Notes (Signed)
Physical Therapy Session Note  Patient Details  Name: Alyssa Blanchard MRN: [id number] Date of Birth: 1970-06-29  Today's Date: 03/02/2022 PT Individual Time: 1st Treatment Session: [phone]; 2nd Treatment Session: [phone] PT Individual Time Calculation (min): 75 min; 50 min  Short Term Goals: Week 1:  PT Short Term Goal 1 (Week 1): STG = LTG 2/2 LOS  Skilled Therapeutic Interventions/Progress Updates:  1st Treatment Session- Patient greeted supine in bed and agreeable to PT treatment session. Patient transitioned from supine to sitting EOB with bed functions and use of bed rail, as well as SBA from therapist. While sitting EOB, therapist donned socks for time management and safety. Patient performed sit/stand and stand pivot transfer from EOB to bedside commode with SBA for safety. Patient with continent bowel/bladder- While voiding, therapist left the room to obtain new wheelchair and walker for improved body mechanics and to promote independence with functional mobility. Patient stood from commode with HDRW and SBA- While standing therapist performed pericare with total assist. Patient returned to a seated position to don pants with assistance from therapist for threading. Patient then ambulated ~20' from the commode to other side of the bed with HDRW and SBA. Once seated EOB, patient doffed pajama top, donned deodorant and new shirt, as well as brushed her hair. Patient then performed sit/stand and stand pivot transfer to wheelchair with HDRW and SBA- minor VC for improved hand placement. Patient attempted pushing the wheelchair, however was only able to propel manual wheelchair ~10' before requiring a break- Therapist pushed patient the remainder of the way to rehab gym.   Patient performed sit/stand with HDRW and SBA and then gait trained x55' with HDRW and SBA- Patient stopped at "thankful" tree where she wrote down what she's thankful for and therapist taped it to the wall. Patient  required x3 standing rest breaks throughout gait trial secondary to fatigue and poor endurance/activity tolerance.   Patient returned to her room sitting EOB while applying Voltaren gel to B knees- Call bell within reach and all needs met.    2nd Treatment Session- Patient greeted supine in bed and agreeable to PT treatment session, however reporting feeling nauseous this afternoon. Patient transitioned from supine to sitting EOB with use of bed rail and supv- While sitting EOB patient requested to use the restroom prior to treatment session. Patient performed sit/stand and stand pivot transfer from EOB to bedside commode with HDRW and SBA/Supv for safety. Patient was able to complete pericare independently. Patient then ambulated from commode to wheelchair (15') with HDRW and SBA. Patient wheeled to rehab gym for time management and energy conservation.   Patient gait trained x38' and x50' with HDRW and SBA/Supv for safety- Patient unable to ambulate further secondary to fatigue and complaints of nausea. Patient required an extended seated rest break in between gait trials.   Patient returned to her room where she performed sit/stand and ambulated (~15') to her bed with HDRW and Supv. Patient then transitioned from sitting EOB to supine with supv and use of bed rail.   Patient left supine in bed with bed alarm on, call bell within reach and all needs met.    Therapy Documentation Precautions:  Precautions Precautions: Fall Restrictions Weight Bearing Restrictions: No   Therapy/Group: Individual Therapy  Adelaido Nicklaus 03/02/2022, 10:31 AM

## 2022-03-03 DIAGNOSIS — E876 Hypokalemia: Secondary | ICD-10-CM

## 2022-03-03 DIAGNOSIS — R5381 Other malaise: Secondary | ICD-10-CM | POA: Diagnosis not present

## 2022-03-03 DIAGNOSIS — E1142 Type 2 diabetes mellitus with diabetic polyneuropathy: Secondary | ICD-10-CM | POA: Diagnosis not present

## 2022-03-03 DIAGNOSIS — I5042 Chronic combined systolic (congestive) and diastolic (congestive) heart failure: Secondary | ICD-10-CM | POA: Diagnosis not present

## 2022-03-03 DIAGNOSIS — I959 Hypotension, unspecified: Secondary | ICD-10-CM

## 2022-03-03 LAB — CBC WITH DIFFERENTIAL/PLATELET
Abs Immature Granulocytes: 0.02 10*3/uL (ref 0.00–0.07)
Basophils Absolute: 0.1 10*3/uL (ref 0.0–0.1)
Basophils Relative: 1 %
Eosinophils Absolute: 0.2 10*3/uL (ref 0.0–0.5)
Eosinophils Relative: 3 %
HCT: 34.6 % — ABNORMAL LOW (ref 36.0–46.0)
Hemoglobin: 10.5 g/dL — ABNORMAL LOW (ref 12.0–15.0)
Immature Granulocytes: 0 %
Lymphocytes Relative: 23 %
Lymphs Abs: 1.4 10*3/uL (ref 0.7–4.0)
MCH: 26.8 pg (ref 26.0–34.0)
MCHC: 30.3 g/dL (ref 30.0–36.0)
MCV: 88.3 fL (ref 80.0–100.0)
Monocytes Absolute: 0.6 10*3/uL (ref 0.1–1.0)
Monocytes Relative: 9 %
Neutro Abs: 3.8 10*3/uL (ref 1.7–7.7)
Neutrophils Relative %: 64 %
Platelets: 202 10*3/uL (ref 150–400)
RBC: 3.92 MIL/uL (ref 3.87–5.11)
RDW: 19.7 % — ABNORMAL HIGH (ref 11.5–15.5)
WBC: 5.9 10*3/uL (ref 4.0–10.5)
nRBC: 0 % (ref 0.0–0.2)

## 2022-03-03 LAB — COMPREHENSIVE METABOLIC PANEL
ALT: 22 U/L (ref 0–44)
AST: 45 U/L — ABNORMAL HIGH (ref 15–41)
Albumin: 3.5 g/dL (ref 3.5–5.0)
Alkaline Phosphatase: 56 U/L (ref 38–126)
Anion gap: 13 (ref 5–15)
BUN: 15 mg/dL (ref 6–20)
CO2: 23 mmol/L (ref 22–32)
Calcium: 9.2 mg/dL (ref 8.9–10.3)
Chloride: 99 mmol/L (ref 98–111)
Creatinine, Ser: 1.06 mg/dL — ABNORMAL HIGH (ref 0.44–1.00)
GFR, Estimated: >60 mL/min (ref >60)
Glucose, Bld: 118 mg/dL — ABNORMAL HIGH (ref 70–99)
Potassium: 3.8 mmol/L (ref 3.5–5.1)
Sodium: 135 mmol/L (ref 135–145)
Total Bilirubin: 0.4 mg/dL (ref 0.3–1.2)
Total Protein: 7.5 g/dL (ref 6.5–8.1)

## 2022-03-03 LAB — GLUCOSE, CAPILLARY
Glucose-Capillary: 138 mg/dL — ABNORMAL HIGH (ref 70–99)
Glucose-Capillary: 111 mg/dL — ABNORMAL HIGH (ref 70–99)
Glucose-Capillary: 114 mg/dL — ABNORMAL HIGH (ref 70–99)
Glucose-Capillary: 131 mg/dL — ABNORMAL HIGH (ref 70–99)

## 2022-03-03 MED ORDER — PANTOPRAZOLE SODIUM 40 MG PO TBEC
40.0000 mg | DELAYED_RELEASE_TABLET | Freq: Two times a day (BID) | ORAL | Status: DC
  Administered 2022-03-03 – 2022-03-11 (×16): 40 mg via ORAL
  Filled 2022-03-03 (×16): qty 1

## 2022-03-03 MED ORDER — ADVANCED MULTI EA PO CHEW
2.0000 | CHEWABLE_TABLET | Freq: Every day | ORAL | Status: DC
  Administered 2022-03-04 – 2022-03-05 (×2): 2 via ORAL
  Filled 2022-03-03 (×4): qty 2

## 2022-03-03 MED ORDER — POTASSIUM CHLORIDE CRYS ER 20 MEQ PO TBCR
20.0000 meq | EXTENDED_RELEASE_TABLET | Freq: Every day | ORAL | Status: DC
  Administered 2022-03-03 – 2022-03-05 (×3): 20 meq via ORAL
  Filled 2022-03-03 (×4): qty 1

## 2022-03-03 NOTE — Plan of Care (Signed)
  Problem: Consults Goal: RH GENERAL PATIENT EDUCATION Description: See Patient Education module for education specifics. Outcome: Progressing   Problem: RH BOWEL ELIMINATION Goal: RH STG MANAGE BOWEL WITH ASSISTANCE Description: STG Manage Bowel with mod I  Assistance. Outcome: Progressing Goal: RH STG MANAGE BOWEL W/MEDICATION W/ASSISTANCE Description: STG Manage Bowel with Medication with mod I Assistance. Outcome: Progressing   Problem: RH BLADDER ELIMINATION Goal: RH STG MANAGE BLADDER WITH ASSISTANCE Description: STG Manage Bladder With mod I  Assistance Outcome: Progressing Goal: RH STG MANAGE BLADDER WITH MEDICATION WITH ASSISTANCE Description: STG Manage Bladder With Medication With mod I  Assistance. Outcome: Progressing   Problem: RH SKIN INTEGRITY Goal: RH STG SKIN FREE OF INFECTION/BREAKDOWN Description: W min assist Outcome: Progressing   Problem: RH SAFETY Goal: RH STG ADHERE TO SAFETY PRECAUTIONS W/ASSISTANCE/DEVICE Description: STG Adhere to Safety Precautions With cues Assistance/Device. Outcome: Progressing   Problem: RH PAIN MANAGEMENT Goal: RH STG PAIN MANAGED AT OR BELOW PT'S PAIN GOAL Description: < 4 with prns Outcome: Progressing   Problem: RH KNOWLEDGE DEFICIT GENERAL Goal: RH STG INCREASE KNOWLEDGE OF SELF CARE AFTER HOSPITALIZATION Description: Patient will be able to manage care at discharge using educational handouts independently Outcome: Progressing   

## 2022-03-03 NOTE — Progress Notes (Signed)
PROGRESS NOTE   Subjective/Complaints: No new concerns this AM. Asked about her potassium level.   ROS: +lower extremity pain,  denies CP,SOB, Abdominal pain   Objective:   No results found. Recent Labs    03/03/22 0656  WBC 5.9  HGB 10.5*  HCT 34.6*  PLT 202   Recent Labs    03/02/22 1203 03/03/22 0656  NA 130* 135  K 3.7 3.8  CL 94* 99  CO2 26 23  GLUCOSE 115* 118*  BUN 15 15  CREATININE 1.05* 1.06*  CALCIUM 9.1 9.2     Intake/Output Summary (Last 24 hours) at 03/03/2022 1324 Last data filed at 03/03/2022 1314 Gross per 24 hour  Intake 1077 ml  Output 2095 ml  Net -1018 ml         Physical Exam: Vital Signs Blood pressure (!) 144/67, pulse 73, temperature 97.6 F (36.4 C), temperature source Oral, resp. rate 17, height 5\' 5"  (1.651 m), weight (!) 198.9 kg, SpO2 98 %. Gen: no distress, normal appearing, BMI 86.80 HEENT: oral mucosa pink and moist, NCAT Cardio: Reg rate Chest: CTAB, normal effort, normal rate of breathing Abd: soft, non-distended, +BS Ext: no edema Psych: pleasant, normal affect Skin: intact Neuro:  Patient is alert and awake.  Makes eye contact with examiner and follows commands.  Provides name and age.  Fair insight and awareness. FNT intact.  Strength 5/5 in b/l UE and LLE Strength  4-/5 proximal RLE to 4+/5 distal RLE Altered sensation L thigh  Musculoskeletal: Tenderness at b/l hips, no joint swelling noted     Assessment/Plan: 1. Functional deficits which require 3+ hours per day of interdisciplinary therapy in a comprehensive inpatient rehab setting. Physiatrist is providing close team supervision and 24 hour management of active medical problems listed below. Physiatrist and rehab team continue to assess barriers to discharge/monitor patient progress toward functional and medical goals  Care Tool:  Bathing    Body parts bathed by patient: Right arm, Left arm,  Chest, Front perineal area, Face, Abdomen, Left upper leg, Right upper leg   Body parts bathed by helper: Right lower leg, Left lower leg, Buttocks     Bathing assist Assist Level: Moderate Assistance - Patient 50 - 74%     Upper Body Dressing/Undressing Upper body dressing   What is the patient wearing?: Pull over shirt    Upper body assist Assist Level: Contact Guard/Touching assist    Lower Body Dressing/Undressing Lower body dressing      What is the patient wearing?: Pants     Lower body assist Assist for lower body dressing: Moderate Assistance - Patient 50 - 74%     Toileting Toileting    Toileting assist Assist for toileting: Minimal Assistance - Patient > 75%     Transfers Chair/bed transfer  Transfers assist     Chair/bed transfer assist level: Contact Guard/Touching assist     Locomotion Ambulation   Ambulation assist      Assist level: Minimal Assistance - Patient > 75% Assistive device: Walker-rolling Max distance: 35   Walk 10 feet activity   Assist     Assist level: Contact Guard/Touching assist Assistive device: Walker-rolling  Walk 50 feet activity   Assist Walk 50 feet with 2 turns activity did not occur: Safety/medical concerns (fatigue)         Walk 150 feet activity   Assist Walk 150 feet activity did not occur: Safety/medical concerns (fatigue)         Walk 10 feet on uneven surface  activity   Assist Walk 10 feet on uneven surfaces activity did not occur: Safety/medical concerns (fatigue)         Wheelchair     Assist Is the patient using a wheelchair?: Yes Type of Wheelchair: Manual    Wheelchair assist level: Minimal Assistance - Patient > 75% Max wheelchair distance: 10    Wheelchair 50 feet with 2 turns activity    Assist        Assist Level: Dependent - Patient 0%   Wheelchair 150 feet activity     Assist      Assist Level: Dependent - Patient 0%   Blood pressure (!)  144/67, pulse 73, temperature 97.6 F (36.4 C), temperature source Oral, resp. rate 17, height 5\' 5"  (1.651 m), weight (!) 198.9 kg, SpO2 98 %.  Medical Problem List and Plan: 1. Functional deficits secondary to debility/morbid obesity/right lower extremity cellulitis             -patient may  shower             -ELOS/Goals: 10-12 days             -Continue CIR 2.  Antithrombotics: -DVT/anticoagulation:  Pharmaceutical: Eliquis             -antiplatelet therapy: N/A 3. Lower extremity pain: Lyrica 100 mg twice daily, oxycodone as needed- increased to q4H prn 4. Mood/Behavior/Sleep: BuSpar 10 mg twice daily             -antipsychotic agents: Provide emotional support             -Hx of bipolar 1 disorder             -DC atarax as she reports this caused Qtc/cardiac issues in past 5. Neuropsych/cognition: This patient is capable of making decisions on her own behalf. 6. Skin/Wound Care: Routine skin checks 7. Fluids/Electrolytes/Nutrition: Routine in and outs with follow-up chemistries 8.  Right leg cellulitis.  Completing course 5 days Ancef. 9.  Chronic combined CHF.  Bumex 2 mg daily, Aldactone 25 mg daily.,  Cozaar 25 mg daily. monitor for any fluid overload.  -No signs of fluid overload at this time [facility]   03/03/22 0934  Weight: (!) 198.9 kg    10.  Atrial fibrillation/prolonged QT  Continue Eliquis 5 mg twice daily, Tikosyn 500 mg twice daily, Inderal 80 mg 3 times daily 11.  Diabetes mellitus.  Hemoglobin A1c 6.1.  SSI.  Consider restart Glucotrol 10 mg twice daily. Prior use of Ozempic/Dupixent? Resume as needed needed  -12/25 CBGs well controlled, continue to monitor CBG (last 3)  Recent Labs    03/02/22 2102 03/03/22 0603 03/03/22 1137  GLUCAP 140* 138* 111*    12.  Morbid obesity.  BMI 86.80.  Dietary follow-up.  History of gastric sleeve 2016 and nonadherence to diet. 13.  GERD.  Protonix 14.  Iron deficiency anemia  -12/25 stable at HGB 10.5 15.  Constipation: add slow mag. Resolved.  16. R chronic thigh pain. Reported to have hx of lumbar radiculopathy. Pain in this area could also be lateral femoral cutaneous nerve irritation. Continue lyrica. 17. Hypotension/AKI:  d/c Losartan, discuss with patient's cardiologist whether spironolactone can be decreased Monitor response to medication change    03/03/2022    1:21 PM 03/03/2022    9:54 AM 03/03/2022    9:53 AM  Vitals with BMI  Systolic [id number]  Diastolic 67 59 59  Pulse 73 71 71    18. Hypokalemia:   -K+ stable at 3.8 after 28meq yesterday, start 45meq daily supplement, recheck Wednesday 19. Nausea: discussed scopolamine patch with nurse.   LOS: 3 days A FACE TO FACE EVALUATION WAS PERFORMED  Jennye Boroughs 03/03/2022, 1:24 PM

## 2022-03-04 DIAGNOSIS — R5381 Other malaise: Secondary | ICD-10-CM | POA: Diagnosis not present

## 2022-03-04 DIAGNOSIS — E119 Type 2 diabetes mellitus without complications: Secondary | ICD-10-CM | POA: Diagnosis not present

## 2022-03-04 LAB — GLUCOSE, CAPILLARY
Glucose-Capillary: 114 mg/dL — ABNORMAL HIGH (ref 70–99)
Glucose-Capillary: 136 mg/dL — ABNORMAL HIGH (ref 70–99)
Glucose-Capillary: 118 mg/dL — ABNORMAL HIGH (ref 70–99)
Glucose-Capillary: 134 mg/dL — ABNORMAL HIGH (ref 70–99)

## 2022-03-04 LAB — MAGNESIUM: Magnesium: 2 mg/dL (ref 1.7–2.4)

## 2022-03-04 MED ORDER — DUPILUMAB 300 MG/2ML ~~LOC~~ SOAJ
300.0000 mg | SUBCUTANEOUS | Status: DC
  Administered 2022-03-06: 300 mg via SUBCUTANEOUS
  Filled 2022-03-04: qty 2

## 2022-03-04 MED ORDER — SEMAGLUTIDE(0.25 OR 0.5MG/DOS) 2 MG/3ML ~~LOC~~ SOPN
0.5000 mg | PEN_INJECTOR | SUBCUTANEOUS | Status: DC
  Administered 2022-03-05: 0.5 mg via SUBCUTANEOUS

## 2022-03-04 NOTE — IPOC Note (Signed)
Overall Plan of Care [facility]) Patient Details Name: Alyssa Blanchard MRN: [id number] DOB: 11-Mar-1970  Admitting Diagnosis: Debility  Hospital Problems: Principal Problem:   Debility     Functional Problem List: Nursing Bowel, Bladder, Pain, Safety, Edema, Endurance, Medication Management, Skin Integrity  PT Balance, Endurance, Pain, Safety  OT Balance, Motor, Sensory, Skin Integrity, Pain, Endurance, Safety  SLP    TR         Basic ADL's: OT Grooming, Bathing, Dressing, Toileting     Advanced  ADL's: OT Simple Meal Preparation     Transfers: PT Bed to Chair, Customer service manager, Tub/Shower     Locomotion: PT Ambulation, Stairs     Additional Impairments: OT    SLP        TR      Anticipated Outcomes Item Anticipated Outcome  Self Feeding Independent  Swallowing      Basic self-care  Mod I  Toileting  Mod I   Bathroom Transfers Mod I  Bowel/Bladder  manage bowel and bladder w mod I assist  Transfers  ModI  Locomotion  Supervision  Communication     Cognition     Pain  < 4 with prns  Safety/Judgment  manage w cues   Therapy Plan: PT Intensity: Minimum of 1-2 x/day ,45 to 90 minutes PT Frequency: 5 out of 7 days PT Duration Estimated Length of Stay: 10-14 days OT Intensity: Minimum of 1-2 x/day, 45 to 90 minutes OT Frequency: 5 out of 7 days OT Duration/Estimated Length of Stay: 10-14 days     Team Interventions: Nursing Interventions Bladder Management, Patient/Family Education, Disease Management/Prevention, Discharge Planning, Skin Care/Wound Management, Pain Management, Medication Management, Bowel Management  PT interventions Ambulation/gait training, Metallurgist training, Discharge planning, Community reintegration, DME/adaptive equipment instruction, Functional mobility training, Pain management, Patient/family education, Stair training, Therapeutic Activities, Therapeutic Exercise, UE/LE Strength taining/ROM  OT Interventions  Warden/ranger, Community reintegration, Disease mangement/prevention, Equities trader education, Self Care/advanced ADL retraining, Therapeutic Exercise, UE/LE Coordination activities, Cognitive remediation/compensation, Discharge planning, DME/adaptive equipment instruction, Functional mobility training, Skin care/wound managment, Therapeutic Activities, UE/LE Strength taining/ROM  SLP Interventions    TR Interventions    SW/CM Interventions Discharge Planning, Psychosocial Support, Patient/Family Education   Barriers to Discharge MD  Medical stability, Home enviroment access/loayout, and Weight  Nursing Home environment access/layout, Lack of/limited family support, Weight 1 level 4ste bil rail w mother;pt sleeps in a recliner and mobilizes in home with RW, reports none of her DME is bariatric.  ADLs Comments: pt reports she showers on good days but it is a long task. batrhoom has a shower seat but not grab bars and she has to step into/out of a tub-shower.  PT Home environment access/layout, Weight 4 STE  OT      SLP      SW Weight, Insurance for SNF coverage, Decreased caregiver support     Team Discharge Planning: Destination: PT-Home ,OT- Home , SLP-  Projected Follow-up: PT-Home health PT, OT-  None, SLP-  Projected Equipment Needs: PT-Other (comment), To be determined (Bariatric RW; WC TBD), OT- 3 in 1 bedside comode, Tub/shower bench, SLP-  Equipment Details: PT- , OT-bariatric equiptment needed Patient/family involved in discharge planning: PT- Patient,  OT-Patient, SLP-   MD ELOS: 10-12 Medical Rehab Prognosis:  Good Assessment: The patient has been admitted for CIR therapies with the diagnosis of debility/morbid obesity/right lower extremity cellulitis . The team will be addressing functional mobility, strength, stamina, balance, safety, adaptive techniques and equipment, self-care, bowel  and bladder mgt, patient and caregiver education. Goals have been set at mod  I. Anticipated discharge destination is home.        See Team Conference Notes for weekly updates to the plan of care

## 2022-03-04 NOTE — Progress Notes (Signed)
Occupational Therapy Session Note  Patient Details  Name: Alyssa Blanchard MRN: [id number] Date of Birth: [birth year]  Session 1 Today's Date: 03/04/2022 OT Individual Time: 4262-7004 OT Individual Time Calculation (min): 69 min    Session 2 Today's Date: 03/04/2022 OT Individual Time: 1400-1500 OT Individual Time Calculation (min): 60 min    Short Term Goals: Week 1:  OT Short Term Goal 1 (Week 1): pt will completed LB dressing min A using AD/AE PRN OT Short Term Goal 2 (Week 1): Pt will tolerate standing >2 minutes for fucntional activity OT Short Term Goal 3 (Week 1): Pt will verbalice 3 energy conservation techniques to utilize upon d/c home  Skilled Therapeutic Interventions/Progress Updates:    Pt received supine, agreeable to OT session. She came to EOB with mod I. Extensive d/c planning and discussion re equipment needs at home. Pt interested in hospital bed d/t sunken living room making her recliner inaccessible. Also discussed shower accessibility and shower chair options. Pt completed 10 ft of functional mobility with the RW with (S) using the RW. She completed UB ADLs seated EOB with (S). She required min A for posterior hygiene (baseline, her mother helps). She completed oral care in standing with (S) using the RW. She required frequent rest breaks during session. She completed UB dressing with mod I. Provided demo of sock aid and pt able to return with (S). D/t time constraints assisted with LB dressing. Pt completed toileting tasks on the bariatric BSC with (S). She returned to the w/c and was left sitting up, awaiting NT to change linens.    Session 2 Pt received supine with c/o pain 8/10 in her R knee and thigh. Pt reporting fatigue and pain from prior session but agreeable to session. She came to EOB with mod I using bed rail. Stand pivot transfer with (S) with the RW. Toileting tasks with (S) overall. She sat EOB and looked through [place] with OT assisting in [place], sock aid, and potentially leg lifter. Problem solved through use of AD at home. She completed 50 ft of functional mobility with the HD RW with (S). She complained of fatigue and pain increasing and needed to sit. In the therapy gym she completed 1 set of 10 alternating tapping on a cone to address functional hip flexion and standing balance for reduced fall risk and improved independence with threshold and stair navigation. Pt required frequent rest breaks during session. She ended with 30 ft of functional mobility before needing to sit. She returned to her room and was left sitting EOB with all needs met.    Therapy Documentation Precautions:  Precautions Precautions: Fall Restrictions Weight Bearing Restrictions: No  Therapy/Group: Individual Therapy  Curtis Sites 03/04/2022, 6:55 AM

## 2022-03-04 NOTE — Progress Notes (Signed)
Patient ID: Alyssa Blanchard, female   DOB: 1970/08/30, 51 y.o.   MRN: [id number]  Met with pt to discuss her questions regarding equipment. She wants a hospital bed, bariatric rolling walker and bariatric drop-arm bedside commode. Will order once have a target discharge date. Pt reports she did one step today but needs to do four to get into her home with one rail. Pt feels she is getting stronger daily. Will meet with after conference tomorrow.

## 2022-03-04 NOTE — Progress Notes (Signed)
PROGRESS NOTE   Subjective/Complaints: Working with therapy.  She plans to have family bring in her home Dupilumab  and Ozempic . Thigh pain is controlled with oxycodone.   ROS: +lower extremity pain,  denies CP,SOB, Abdominal pain, HA.    Objective:   No results found. Recent Labs    03/03/22 0656  WBC 5.9  HGB 10.5*  HCT 34.6*  PLT 202    Recent Labs    03/02/22 1203 03/03/22 0656  NA 130* 135  K 3.7 3.8  CL 94* 99  CO2 26 23  GLUCOSE 115* 118*  BUN 15 15  CREATININE 1.05* 1.06*  CALCIUM 9.1 9.2     Intake/Output Summary (Last 24 hours) at 03/04/2022 1458 Last data filed at 03/04/2022 1300 Gross per 24 hour  Intake 487 ml  Output 1650 ml  Net -1163 ml         Physical Exam: Vital Signs Blood pressure 106/63, pulse 71, temperature 97.7 F (36.5 C), temperature source Oral, resp. rate 16, height 5\' 5"  (1.651 m), weight (!) 198.9 kg, SpO2 100 %. Gen: no distress, normal appearing, walking with walker with therapy  HEENT: oral mucosa pink and moist, NCAT Cardio: Reg rate Chest: CTAB, normal effort, normal rate of breathing Abd: soft, non-distended, +BS Ext: no edema Psych: pleasant, normal affect Skin: intact Neuro:  Patient is alert and awake.  Makes eye contact with examiner and follows commands.  Provides name and age.  Fair insight and awareness. FNT intact.  Strength 5/5 in b/l UE and LLE Strength  4-/5 proximal RLE to 4+/5 distal RLE Altered sensation L thigh  Musculoskeletal: Tenderness at b/l hips, no joint swelling noted     Assessment/Plan: 1. Functional deficits which require 3+ hours per day of interdisciplinary therapy in a comprehensive inpatient rehab setting. Physiatrist is providing close team supervision and 24 hour management of active medical problems listed below. Physiatrist and rehab team continue to assess barriers to discharge/monitor patient progress toward functional  and medical goals  Care Tool:  Bathing    Body parts bathed by patient: Right arm, Left arm, Chest, Front perineal area, Face, Abdomen, Left upper leg, Right upper leg   Body parts bathed by helper: Right lower leg, Left lower leg, Buttocks     Bathing assist Assist Level: Moderate Assistance - Patient 50 - 74%     Upper Body Dressing/Undressing Upper body dressing   What is the patient wearing?: Pull over shirt    Upper body assist Assist Level: Contact Guard/Touching assist    Lower Body Dressing/Undressing Lower body dressing      What is the patient wearing?: Pants     Lower body assist Assist for lower body dressing: Moderate Assistance - Patient 50 - 74%     Toileting Toileting    Toileting assist Assist for toileting: Minimal Assistance - Patient > 75%     Transfers Chair/bed transfer  Transfers assist     Chair/bed transfer assist level: Contact Guard/Touching assist     Locomotion Ambulation   Ambulation assist      Assist level: Minimal Assistance - Patient > 75% Assistive device: Walker-rolling Max distance: 35  Walk 10 feet activity   Assist     Assist level: Contact Guard/Touching assist Assistive device: Walker-rolling   Walk 50 feet activity   Assist Walk 50 feet with 2 turns activity did not occur: Safety/medical concerns (fatigue)         Walk 150 feet activity   Assist Walk 150 feet activity did not occur: Safety/medical concerns (fatigue)         Walk 10 feet on uneven surface  activity   Assist Walk 10 feet on uneven surfaces activity did not occur: Safety/medical concerns (fatigue)         Wheelchair     Assist Is the patient using a wheelchair?: Yes Type of Wheelchair: Manual    Wheelchair assist level: Minimal Assistance - Patient > 75% Max wheelchair distance: 10    Wheelchair 50 feet with 2 turns activity    Assist        Assist Level: Total Assistance - Patient < 25%    Wheelchair 150 feet activity     Assist      Assist Level: Total Assistance - Patient < 25%   Blood pressure 106/63, pulse 71, temperature 97.7 F (36.5 C), temperature source Oral, resp. rate 16, height 5\' 5"  (1.651 m), weight (!) 198.9 kg, SpO2 100 %.  Medical Problem List and Plan: 1. Functional deficits secondary to debility/morbid obesity/right lower extremity cellulitis             -patient may  shower             -ELOS/Goals: 10-12 days             -Continue CIR  -Did gait draining for 83 feet 2.  Antithrombotics: -DVT/anticoagulation:  Pharmaceutical: Eliquis             -antiplatelet therapy: N/A 3. Lower extremity pain: Lyrica 100 mg twice daily, oxycodone as needed- increased to q4H prn 4. Mood/Behavior/Sleep: BuSpar 10 mg twice daily             -antipsychotic agents: Provide emotional support             -Hx of bipolar 1 disorder             -DC atarax as she reports this caused Qtc/cardiac issues in past 5. Neuropsych/cognition: This patient is capable of making decisions on her own behalf. 6. Skin/Wound Care: Routine skin checks 7. Fluids/Electrolytes/Nutrition: Routine in and outs with follow-up chemistries 8.  Right leg cellulitis.  Completing course 5 days Ancef. 9.  Chronic combined CHF.  Bumex 2 mg daily, Aldactone 25 mg daily.,  Cozaar 25 mg daily. monitor for any fluid overload.  -No signs of fluid overload at this time [facility]   03/03/22 0934  Weight: (!) 198.9 kg    10.  Atrial fibrillation/prolonged QT  Continue Eliquis 5 mg twice daily, Tikosyn 500 mg twice daily, Inderal 80 mg 3 times daily 11.  Diabetes mellitus.  Hemoglobin A1c 6.1.  SSI.  Consider restart Glucotrol 10 mg twice daily. Prior use of Ozempic/Dupixent? Resume as needed needed  -12/25 CBGs well controlled, continue to monitor  -12/26 Called pharmacy regarding Ozempic/Dupixent, family to bring medicine in from home for tomorrow/Thursday CBG (last 3)  Recent Labs     03/03/22 2120 03/04/22 0518 03/04/22 1221  GLUCAP 131* 114* 136*     12.  Morbid obesity.  BMI 86.80.  Dietary follow-up.  History of gastric sleeve 2016 and nonadherence to diet. 13.  GERD.  Protonix 14.  Iron deficiency anemia  -12/25 stable at HGB 10.5 15. Constipation: add slow mag. Resolved.  16. R chronic thigh pain. Reported to have hx of lumbar radiculopathy. Pain in this area could also be lateral femoral cutaneous nerve irritation. Continue lyrica. 17. Hypotension/AKI: d/c Losartan, discuss with patient's cardiologist whether spironolactone can be decreased Monitor response to medication change    03/04/2022    2:06 PM 03/04/2022    7:53 AM 03/04/2022    3:21 AM  Vitals with BMI  Systolic 106 108 031  Diastolic 63 61 60  Pulse 71  75    18. Hypokalemia:   -K+ stable at 3.8 after yesterday, start daily supplement, recheck Wednesday 19. Nausea: discussed scopolamine patch with nurse.    LOS: 4 days A FACE TO FACE EVALUATION WAS PERFORMED  Fanny Dance 03/04/2022, 2:58 PM

## 2022-03-04 NOTE — Progress Notes (Signed)
Physical Therapy Session Note  Patient Details  Name: Alyssa Blanchard MRN: [id number] Date of Birth: 1970-12-13  Today's Date: 03/04/2022 PT Individual Time: 1045-1200 PT Individual Time Calculation (min): 75 min   Short Term Goals: Week 1:  PT Short Term Goal 1 (Week 1): STG = LTG 2/2 LOS  Skilled Therapeutic Interventions/Progress Updates:  Patient greeted sitting upright in wheelchair in room and agreeable to PT treatment session. Patient wheeled to rehab gym for time management and energy conservation. Patient ambulated (~25') to/from wheelchair and Nustep with HDRW and Supv. Patient completed the Nustep x10 minutes on level 5 with B UE/LE- Various rest breaks required throughout secondary to fatigue.   Patient gait trained x83' with HDRW and SBA/Supv for safety- Patient required 4-5 standing rest breaks throughout gait trial with increased time required to complete secondary to impaired endurance.   Patient then performed x4 step-ups to 6" step with B HR and CGA for safety- Patient required increased time to complete secondary decreased strength and endurance. Throughout stair mobility VC provided for decreased B UE support and using mostly B LE- Patient ascended with L LE and descended with R LE. Extended seated rest break required after stair mobility.   Patient performed sit/stand x5 without the use of an AD and CGA/SBA- Patient required increased time to perform all 5 reps secondary to poor endurance/activity tolerance.   Patient returned to her room where she performed sit/stand and ambulated to her bed with HDRW and Supv. Patient left sitting EOB applying Voltaren gel with call bell within reach and all needs met.    Therapy Documentation Precautions:  Precautions Precautions: Fall Restrictions Weight Bearing Restrictions: No   Therapy/Group: Individual Therapy  Padme Arriaga 03/04/2022, 7:47 AM

## 2022-03-04 NOTE — Progress Notes (Signed)
Patient ID: Alyssa Blanchard, female   DOB: [birth year], 51 y.o.   MRN: [id number] Met with the patient to review current situation, rehab process, team conference and plan of care. Discussed home meds and current medications. Discussed PCP; requested information on new PCP, given list of [facility] affiliated practices/ access information from "find me a doctor" for Internal medicine MD . Discussed secondary risk including HF, A-fib and T2DM (A1C 6.1); patient noted that A1C was higher and she had worked to get that more in control. Continue to follow along to address educational needs to facilitate preparation for discharge. Margarito Liner

## 2022-03-04 NOTE — Progress Notes (Signed)
Inpatient Rehabilitation  Patient information reviewed and entered into eRehab system by Fintan Grater M. Tametra Ahart, M.A., CCC/SLP, PPS Coordinator.  Information including medical coding, functional ability and quality indicators will be reviewed and updated through discharge.    

## 2022-03-05 DIAGNOSIS — L309 Dermatitis, unspecified: Secondary | ICD-10-CM

## 2022-03-05 DIAGNOSIS — R5381 Other malaise: Secondary | ICD-10-CM | POA: Diagnosis not present

## 2022-03-05 DIAGNOSIS — E1142 Type 2 diabetes mellitus with diabetic polyneuropathy: Secondary | ICD-10-CM | POA: Diagnosis not present

## 2022-03-05 DIAGNOSIS — I959 Hypotension, unspecified: Secondary | ICD-10-CM | POA: Diagnosis not present

## 2022-03-05 DIAGNOSIS — E876 Hypokalemia: Secondary | ICD-10-CM | POA: Diagnosis not present

## 2022-03-05 DIAGNOSIS — R110 Nausea: Secondary | ICD-10-CM

## 2022-03-05 LAB — GLUCOSE, CAPILLARY
Glucose-Capillary: 137 mg/dL — ABNORMAL HIGH (ref 70–99)
Glucose-Capillary: 112 mg/dL — ABNORMAL HIGH (ref 70–99)
Glucose-Capillary: 146 mg/dL — ABNORMAL HIGH (ref 70–99)
Glucose-Capillary: 110 mg/dL — ABNORMAL HIGH (ref 70–99)

## 2022-03-05 MED ORDER — CHILDRENS CHEW MULTIVITAMIN PO CHEW
2.0000 | CHEWABLE_TABLET | Freq: Every day | ORAL | Status: DC
  Filled 2022-03-05 (×2): qty 2

## 2022-03-05 MED ORDER — BUMETANIDE 2 MG PO TABS: 2.0000 mg | ORAL_TABLET | Freq: Every day | ORAL | Status: DC

## 2022-03-05 NOTE — Progress Notes (Signed)
Physical Therapy Session Note  Patient Details  Name: Alyssa Blanchard MRN: [id number] Date of Birth: 04-Dec-1970  Today's Date: 03/05/2022 PT Individual Time: 1030-1130 PT Individual Time Calculation (min): 60 min   Short Term Goals: Week 1:  PT Short Term Goal 1 (Week 1): STG = LTG 2/2 LOS  Skilled Therapeutic Interventions/Progress Updates:  Patient greeted supine in bed with RN present administering medication and agreeable to PT treatment session. Patient transitioned from supine to sitting EOB independently. Patient performed sit/stand and stand pivot transfer with HDRW to bedside commode with distant supv. Patient was independent with pericare and managing LB clothing. Patient then stood with HDRW and ambulated ~15' to wheelchair from commode with distant supv. Patient attempted to propel manual wheelchair with B UE, however was only able to go ~5' prior to reporting increased fatigue and notable difficulty advancing the wheelchair. Patient dependently wheeled to the gym the remainder of the way for energy conservation and time management.   Patient gait trained x103' with HDRW and distant supv- Patient required increased time to complete gait trial with several standing rest breaks throughout trial. Patient required an extended seated rest break after gait trial secondary to fatigue.   Patient stood with HDRW and performed alternating foot taps to 4" step for pre-stair training. Patient performed 2 x 10 and 2 x 10 with a seated rest break in between secondary to fatigue. As patient fatigued, she demonstrated decreased R foot clearance however was still able to complete activity.   Patient performed step-ups to 6" steps with B HR and SBA- Patient performed 2 x 4 step-ups leading with L LE and extended seated rest beak in between trials.   Patient returned to her room and ambulated from wheelchair to EOB with HDRW and distant Supv. Patient left sitting EOB with call bell within reach, tray  table in front and call bell within reach.   Therapy Documentation Precautions:  Precautions Precautions: Fall Restrictions Weight Bearing Restrictions: No   Therapy/Group: Individual Therapy  Alyssa Blanchard 03/05/2022, 7:56 AM

## 2022-03-05 NOTE — Progress Notes (Addendum)
Occupational Therapy Session Note  Patient Details  Name: Alyssa Blanchard MRN: [id number] Date of Birth: 10/03/1970  Today's Date: 03/05/2022 Session 1: OT Individual Time: 5364-6803 OT Individual Time Calculation (min): 68 min   Session 2:  OT Individual Time: 1301 - 1415 OT Individual Time Calculation (min):  74 min   Short Term Goals: Week 1:  OT Short Term Goal 1 (Week 1): pt will completed LB dressing min A using AD/AE PRN OT Short Term Goal 2 (Week 1): Pt will tolerate standing >2 minutes for fucntional activity OT Short Term Goal 3 (Week 1): Pt will verbalice 3 energy conservation techniques to utilize upon d/c home  Skilled Therapeutic Interventions/Progress Updates:   Session 1 :   Patient agreeable to participate in OT session. Reports right thigh pain. No number provided. Monitored during session.   Patient participated in skilled OT session focusing on functional transfers, bed mobility, toileting, toilet transfer, ADL re-training. Therapist facilitated session while allowing pt to complete all aspects of self care tasks to encourage increased independence. Pt completed bed mobility (transitioning to seated EOB), toilet transfer utilizing RW to [facility], toilet hygiene, and teeth brushing at SBA. Pt was able to verbalize supplies needed and level of assist. Bathing completed while seated EOB at Min A while requiring physical assist to wash feet and buttocks. SBA provided to complete LB dressing with increased time. Pt donned pants and slip on shoes only.   Session 2: Patient agreeable to participate in OT session. Reports 5/10 pain level in right thigh/leg.   Patient participated in skilled OT session focusing on functional transfer, toileting, toilet transfer, endurance, and activity tolerance . - Bed mobility performed with Mod I transitioning from semi reclined long sitting to sitting EOB. Completed toilet transfer utilizing BSC and RW with SBA. Toileting completed Mod I.   Therapist assisted patient with donning socks and tennis shoes as AE was not available for use. - Pt participated in standing tolerance activity using RW if needed for balance. Bilateral 1.5lb wrist weighted worn while attempting to make baskets. 3 standing trails completed. 1st trial: 45" 2nd trial: 55", 3rd trial: 50" - Alternating seated toe taps using 4 inch step, 10X total. Completed toe tapping standing using left foot only, 10X to work on hip flexor and quad strength needed manage steps in/out of home. - Worked on lower extremity weightshifting in order to progress ability to ascend stairs using 2 in step. Pt completed 2 trials placing her left foot onto step and off loading Right foot almost completely. RW used for balance. Pt reports left knee pain due to putting more stress on joint as her right leg has been painful. Suggested using a velcro knee wrap/brace and both patient and PT agreed with suggestion. OT measured kneecap at largest area: 28 inches.     Therapy Documentation Precautions:  Precautions Precautions: Fall Restrictions Weight Bearing Restrictions: No  Therapy/Group: Individual Therapy  Limmie Patricia, OTR/L,CBIS  Supplemental OT - [facility]  03/05/2022, 8:00 AM

## 2022-03-05 NOTE — Progress Notes (Signed)
Patient ID: Alyssa Blanchard, female   DOB: [birth year], 51 y.o.   MRN: [id number]  Met with pt to give her a team conference update regarding goals of mod/I level and target discharge date of 1/2. Her main barrier is the four steps into her home. She has done one and is doing better since yesterday will continue to work on goal of four. Discussed equipment-drop-arm bedside commode, rolling walker and hospital bed. She feels better about her goals and optimistic can reach by 1/2.

## 2022-03-05 NOTE — Progress Notes (Addendum)
PROGRESS NOTE   Subjective/Complaints: She continues to have hip and knee soreness controled with current medications.   ROS: +lower extremity pain,  denies CP,SOB, Abdominal pain, + itching-chronic   Objective:   No results found. Recent Labs    03/03/22 0656  WBC 5.9  HGB 10.5*  HCT 34.6*  PLT 202    Recent Labs    03/03/22 0656  NA 135  K 3.8  CL 99  CO2 23  GLUCOSE 118*  BUN 15  CREATININE 1.06*  CALCIUM 9.2     Intake/Output Summary (Last 24 hours) at 03/05/2022 1352 Last data filed at 03/05/2022 0743 Gross per 24 hour  Intake 480 ml  Output 800 ml  Net -320 ml         Physical Exam: Vital Signs Blood pressure 109/60, pulse 64, temperature 98.2 F (36.8 C), temperature source Oral, resp. rate 16, height _0  (1.651 m), weight (!) 198.9 kg, SpO2 94 %. Gen: no distress, normal appearing, walking with walker with therapy  HEENT: oral mucosa pink and moist, NCAT Cardio: Reg rate Chest: CTAB, normal effort, normal rate of breathing Abd: soft, non-distended, +BS Ext: no edema Psych: pleasant, normal affect Skin: intact Neuro:  Patient is alert and awake.  Makes eye contact with examiner and follows commands.  Provides name and age.  Fair insight and awareness. FNT intact.  Strength 5/5 in b/l UE and LLE Strength  4-/5 proximal RLE to 4+/5 distal RLE Altered sensation L thigh  Musculoskeletal: Tenderness at b/l hips, no joint swelling noted     Assessment/Plan: 1. Functional deficits which require 3+ hours per day of interdisciplinary therapy in a comprehensive inpatient rehab setting. Physiatrist is providing close team supervision and 24 hour management of active medical problems listed below. Physiatrist and rehab team continue to assess barriers to discharge/monitor patient progress toward functional and medical goals  Care Tool:  Bathing    Body parts bathed by patient: Right arm,  Left arm, Chest, Front perineal area, Face, Abdomen, Left upper leg, Right upper leg   Body parts bathed by helper: Right lower leg, Left lower leg, Buttocks     Bathing assist Assist Level: Moderate Assistance - Patient 50 - 74%     Upper Body Dressing/Undressing Upper body dressing   What is the patient wearing?: Pull over shirt    Upper body assist Assist Level: Contact Guard/Touching assist    Lower Body Dressing/Undressing Lower body dressing      What is the patient wearing?: Pants     Lower body assist Assist for lower body dressing: Moderate Assistance - Patient 50 - 74%     Toileting Toileting    Toileting assist Assist for toileting: Minimal Assistance - Patient > 75%     Transfers Chair/bed transfer  Transfers assist     Chair/bed transfer assist level: Contact Guard/Touching assist     Locomotion Ambulation   Ambulation assist      Assist level: Minimal Assistance - Patient > 75% Assistive device: Walker-rolling Max distance: 35   Walk 10 feet activity   Assist     Assist level: Contact Guard/Touching assist Assistive device: Walker-rolling   Walk  50 feet activity   Assist Walk 50 feet with 2 turns activity did not occur: Safety/medical concerns (fatigue)         Walk 150 feet activity   Assist Walk 150 feet activity did not occur: Safety/medical concerns (fatigue)         Walk 10 feet on uneven surface  activity   Assist Walk 10 feet on uneven surfaces activity did not occur: Safety/medical concerns (fatigue)         Wheelchair     Assist Is the patient using a wheelchair?: Yes Type of Wheelchair: Manual    Wheelchair assist level: Minimal Assistance - Patient > 75% Max wheelchair distance: 10    Wheelchair 50 feet with 2 turns activity    Assist        Assist Level: Total Assistance - Patient < 25%   Wheelchair 150 feet activity     Assist      Assist Level: Total Assistance -  Patient < 25%   Blood pressure 109/60, pulse 64, temperature 98.2 F (36.8 C), temperature source Oral, resp. rate 16, height _0  (1.651 m), weight (!) 198.9 kg, SpO2 94 %.  Medical Problem List and Plan: 1. Functional deficits secondary to debility/morbid obesity/right lower extremity cellulitis             -patient may  shower             -ELOS/Goals: 10-12 days             -Continue CIR  -Team conference today please see physician documentation under team conference tab, met with team  to discuss problems,progress, and goals. Formulized individual treatment plan based on medical history, underlying problem and comorbidities.   -Working on getting up 4 steps, this is need to get into and out of home  2.  Antithrombotics: -DVT/anticoagulation:  Pharmaceutical: Eliquis             -antiplatelet therapy: N/A 3. Lower extremity pain: Lyrica 100 mg twice daily, oxycodone as needed- increased to q4H prn 4. Mood/Behavior/Sleep: BuSpar 10 mg twice daily             -antipsychotic agents: Provide emotional support             -Hx of bipolar 1 disorder             -DC atarax as she reports this caused Qtc/cardiac issues in past 5. Neuropsych/cognition: This patient is capable of making decisions on her own behalf. 6. Skin/Wound Care: Routine skin checks 7. Fluids/Electrolytes/Nutrition: Routine in and outs with follow-up chemistries 8.  Right leg cellulitis.  Completing course 5 days Ancef. 9.  Chronic combined CHF.  Bumex 2 mg daily, Aldactone 25 mg daily.,  Cozaar 25 mg daily. monitor for any fluid overload.  -No signs of fluid overload at this time [facility]   03/03/22 0934  Weight: (!) 198.9 kg    10.  Atrial fibrillation/prolonged QT  Continue Eliquis 5 mg twice daily, Tikosyn 500 mg twice daily, Inderal 80 mg 3 times daily 11.  Diabetes mellitus.  Hemoglobin A1c 6.1.  SSI.  Consider restart Glucotrol 10 mg twice daily. Prior use of Ozempic/Dupixent? Resume as needed  needed  -12/25 CBGs well controlled, continue to monitor  -12/26 Called pharmacy regarding Ozempic, family to bring medicine in from home for tomorrow/Thursday CBG (last 3)   -12/27 well controlled, she asked about restarting glucotrol, will hold off on this as CBGs well controlled and would not  want to increase risk of hypoglycemia Recent Labs    03/04/22 2112 03/05/22 0620 03/05/22 1201  GLUCAP 134* 137* 112*     12.  Morbid obesity.  BMI 86.80.  Dietary follow-up.  History of gastric sleeve 2016 and nonadherence to diet. 13.  GERD.  Protonix 14.  Iron deficiency anemia  -12/25 stable at HGB 10.5 15. Constipation: add slow mag. Resolved.  16. R chronic thigh pain. Reported to have hx of lumbar radiculopathy. Pain in this area could also be lateral femoral cutaneous nerve irritation. Continue lyrica. 17. Hypotension/AKI: d/c Losartan, discuss with patient's cardiologist whether spironolactone can be decreased Monitor response to medication change  -Recheck labs tomorrow/BP has been stable, will hold bumex    03/05/2022    5:29 AM 03/04/2022    7:36 PM 03/04/2022    2:06 PM  Vitals with BMI  Systolic 032 201 992  Diastolic 60 63 63  Pulse 64 80 71    18. Hypokalemia:   -K+ stable at 3.8 after 50mq yesterday, start 23m daily supplement, recheck tomorrow 12/28 19. Nausea:   -Improved, continue scopolamine patch for now 20. Eczema   -Dupixent, family to bring in today or tomorrow   LOS: 5 days A FACE TO FACE EVALUATION WAS PERFORMED  YuJennye Boroughs2/27/2023, 1:52 PM

## 2022-03-05 NOTE — Progress Notes (Signed)
Home medication delivered to pharmacy.    Tilden Dome, LPN

## 2022-03-05 NOTE — Patient Care Conference (Signed)
Inpatient RehabilitationTeam Conference and Plan of Care Update Date: 03/05/2022   Time: 12:10 PM    Patient Name: Alyssa Blanchard [facility]      Medical Record Number: [id number]  Date of Birth: 09-11-1970 Sex: Female         Room/Bed: 4W26C/4W26C-01 Payor Info: Payor: [place] MEDICAID PREPAID HEALTH PLAN / Plan: [place] MEDICAID [employer] / Product Type: *No Product type* /    Admit Date/Time:  02/28/2022  1:07 PM  Primary Diagnosis:  Debility  Hospital Problems: Principal Problem:   Debility    Expected Discharge Date: Expected Discharge Date: 03/11/22  Team Members Present: Physician leading conference: Dr. Fanny Dance Social Worker Present: Dossie Der, LCSW Nurse Present: Chana Bode, RN PT Present: Amedeo Plenty, PT OT Present: Roney Mans, OT PPS Coordinator present : Fae Pippin, SLP     Current Status/Progress Goal Weekly Team Focus  Bowel/Bladder   Continent of b/b   Remain continent   Assist with toileting as needed    Swallow/Nutrition/ Hydration               ADL's   Mod I UB ADLs, (S) LB ADLs with adaptive equipment, required min A with toileting tasks from mother occasionally. Biggest barrier is stairs and sunken living room. Close to baseline for OT   mod I to supervision   ADLs, transfers, endurance, strengthening    Mobility   Supervision/SBA for all functional mobility with HDRW, Supv for gait up to 80', CGA/MinA for stair mobility- Limited by strength and endurance   ModI/Supv  Dynamic stability, strengthening, endurance/activity tolerance, stair training, gait, transfers, discharge planning    Communication                Safety/Cognition/ Behavioral Observations               Pain   C/O pain to BLE, PRN medications available.   Pain <3/10   Monitor effectiveness and re-evaluate as needed    Skin   Multiple scabs on BLE. Pt scratches and removes scab occasionally causing bleeding. Covered with dry dressing to  stop bleeding. Nystatin powder to posterior knee. Remainder of skin intact.   Pt will cease to scratch scabs off.  Assess Qshift and prn      Discharge Planning:  HOme with Mom who has health issues of her own and can not assist-pt will need to get up four steps into their home.   Team Discussion: Patient with joint pain, monitoring labs.  Patient on target to meet rehab goals: yes, currently needs mod I assist for upper body care and supervision for lower body using adaptive equipment.  Able to ambulate up to 100' with supervision using a RW; has difficulty with steps due to knee buckling.  *See Care Plan and progress notes for long and short-term goals.   Revisions to Treatment Plan:  Practice steps  Teaching Needs: Safety, medications, dietary modification, transfers, toileting, stair management, etc  Current Barriers to Discharge: Decreased caregiver support, Home enviroment access/layout, and Weight  Possible Resolutions to Barriers: Family education Handrail for steps DME: BSC OP follow up services     Medical Summary Current Status: Debility, obesity, DM, CHF, Exzema, AKI, hypokalemia  Barriers to Discharge: Electrolyte abnormality;Medical stability;Morbid Obesity  Barriers to Discharge Comments: Debility, obesity, DM, CHF, Exzema, AKI, hypokalemia Possible Resolutions to [employer] Focus: Work on stairs, monitor lytes, monitor CBG, ozempic   Continued Need for Acute Rehabilitation Level of Care: The patient requires daily medical management by  a physician with specialized training in physical medicine and rehabilitation for the following reasons: Direction of a multidisciplinary physical rehabilitation program to maximize functional independence : Yes Medical management of patient stability for increased activity during participation in an intensive rehabilitation regime.: Yes Analysis of laboratory values and/or radiology reports with any subsequent need for  medication adjustment and/or medical intervention. : Yes   I attest that I was present, lead the team conference, and concur with the assessment and plan of the team.   Dorien Chihuahua B 03/05/2022, 1:32 PM

## 2022-03-06 DIAGNOSIS — R5381 Other malaise: Secondary | ICD-10-CM | POA: Diagnosis not present

## 2022-03-06 DIAGNOSIS — E876 Hypokalemia: Secondary | ICD-10-CM | POA: Diagnosis not present

## 2022-03-06 DIAGNOSIS — E1142 Type 2 diabetes mellitus with diabetic polyneuropathy: Secondary | ICD-10-CM | POA: Diagnosis not present

## 2022-03-06 DIAGNOSIS — L309 Dermatitis, unspecified: Secondary | ICD-10-CM | POA: Diagnosis not present

## 2022-03-06 DIAGNOSIS — R7989 Other specified abnormal findings of blood chemistry: Secondary | ICD-10-CM

## 2022-03-06 LAB — BASIC METABOLIC PANEL
Anion gap: 9 (ref 5–15)
BUN: 10 mg/dL (ref 6–20)
CO2: 30 mmol/L (ref 22–32)
Calcium: 9 mg/dL (ref 8.9–10.3)
Chloride: 98 mmol/L (ref 98–111)
Creatinine, Ser: 0.87 mg/dL (ref 0.44–1.00)
GFR, Estimated: >60 mL/min (ref >60)
Glucose, Bld: 132 mg/dL — ABNORMAL HIGH (ref 70–99)
Potassium: 3.4 mmol/L — ABNORMAL LOW (ref 3.5–5.1)
Sodium: 137 mmol/L (ref 135–145)

## 2022-03-06 LAB — GLUCOSE, CAPILLARY
Glucose-Capillary: 125 mg/dL — ABNORMAL HIGH (ref 70–99)
Glucose-Capillary: 117 mg/dL — ABNORMAL HIGH (ref 70–99)
Glucose-Capillary: 115 mg/dL — ABNORMAL HIGH (ref 70–99)
Glucose-Capillary: 154 mg/dL — ABNORMAL HIGH (ref 70–99)

## 2022-03-06 LAB — MAGNESIUM: Magnesium: 2 mg/dL (ref 1.7–2.4)

## 2022-03-06 MED ORDER — BUMETANIDE 2 MG PO TABS
2.0000 mg | ORAL_TABLET | Freq: Every day | ORAL | Status: DC
  Administered 2022-03-07 – 2022-03-10 (×4): 2 mg via ORAL
  Filled 2022-03-06 (×4): qty 1

## 2022-03-06 MED ORDER — SCOPOLAMINE 1 MG/3DAYS TD PT72
1.0000 | MEDICATED_PATCH | TRANSDERMAL | Status: DC
  Administered 2022-03-06 – 2022-03-09 (×2): 1.5 mg via TRANSDERMAL
  Filled 2022-03-06 (×2): qty 1

## 2022-03-06 MED ORDER — POTASSIUM CHLORIDE CRYS ER 20 MEQ PO TBCR
30.0000 meq | EXTENDED_RELEASE_TABLET | Freq: Every day | ORAL | Status: DC
  Administered 2022-03-07 – 2022-03-11 (×5): 30 meq via ORAL
  Filled 2022-03-06 (×5): qty 1

## 2022-03-06 MED ORDER — POTASSIUM CHLORIDE CRYS ER 20 MEQ PO TBCR
40.0000 meq | EXTENDED_RELEASE_TABLET | Freq: Once | ORAL | Status: AC
  Administered 2022-03-06: 40 meq via ORAL
  Filled 2022-03-06: qty 2

## 2022-03-06 MED ORDER — BUMETANIDE 1 MG PO TABS
1.0000 mg | ORAL_TABLET | Freq: Every day | ORAL | Status: DC
  Administered 2022-03-06 – 2022-03-07 (×2): 1 mg via ORAL
  Filled 2022-03-06 (×3): qty 1

## 2022-03-06 MED ORDER — POTASSIUM CHLORIDE CRYS ER 20 MEQ PO TBCR: 40.0000 meq | EXTENDED_RELEASE_TABLET | Freq: Once | ORAL | Status: DC

## 2022-03-06 MED ORDER — SEMAGLUTIDE(0.25 OR 0.5MG/DOS) 2 MG/3ML ~~LOC~~ SOPN: 0.5000 mg | PEN_INJECTOR | SUBCUTANEOUS | Status: DC

## 2022-03-06 NOTE — Progress Notes (Signed)
Patient ID: Alyssa Blanchard, female   DOB: [birth year], 51 y.o.   MRN: [id number]  Have ordered hospital bed and bariatric drop-arm bedside commode via [employer] to be delivered to her home.

## 2022-03-06 NOTE — Progress Notes (Signed)
Occupational Therapy Session Note  Patient Details  Name: Alyssa Blanchard MRN: [id number] Date of Birth: 02-24-1971  Today's Date: 03/06/2022 OT Individual Time: 1345-1430 OT Individual Time Calculation (min): 45 min    Short Term Goals: Week 1:  OT Short Term Goal 1 (Week 1): pt will completed LB dressing min A using AD/AE PRN OT Short Term Goal 2 (Week 1): Pt will tolerate standing >2 minutes for fucntional activity OT Short Term Goal 3 (Week 1): Pt will verbalice 3 energy conservation techniques to utilize upon d/c home Week 2:     Skilled Therapeutic Interventions/Progress Updates:    1:1 Pt received resting in bed - pt able to perform bed moblity mod I to come to EOB with bed features. With RW transferred to [facility] with supervision and performed toileting with RW sit to stand (with pants and underwear as lower body clothing )with setup. Pt requested to work on endurance overall. At first plan was to walk with left knee wrapped but then switched to bathing at shower level. Pt navigated around room with RW with supervision. In shower pt sat on BSC in shower. Attempted to try to use toilet wand for bathing periarea but wash clothes came out of pincher portion and ended up requiring A for periarea and buttocks (mother assists as she can). In standing pt able to stand on one LE (her right) to aide therapist during washing peri area. Pt able to bathe all other parts with long handled sponge. Pt required extra time with tasks. Pt ambulated back to bed with supervision and entered the bed on the right side with bed features. Pt left resting in the bed.   Pt's nausea patch came off in shower - let RN know.  Therapy Documentation Precautions:  Precautions Precautions: Fall Restrictions Weight Bearing Restrictions: No General:   Vital Signs: Therapy Vitals Temp: 97.7 F (36.5 C) Temp Source: Oral Pulse Rate: 71 Resp: 16 BP: (!) 120/57 Patient Position (if appropriate): Lying Oxygen  Therapy SpO2: 96 % O2 Device: Room Air Pain: Pt c/o pain in right hip and left knee and requested to not do stairs anymore today - "Im worn out"  Therapy/Group: Individual Therapy  Roney Mans [facility] 03/06/2022, 3:57 PM

## 2022-03-06 NOTE — Progress Notes (Addendum)
PROGRESS NOTE   Subjective/Complaints: Working with therapy this AM. We discussed increase in potassium supplement. Therapy is looking into a knee brace.   ROS: +lower extremity pain,  + B/L Hip pain. denies CP,SOB, Abdominal pain, + itching-chronic   Objective:   No results found. No results for input(s): "WBC", "HGB", "HCT", "PLT" in the last 72 hours.  Recent Labs    03/06/22 0552  NA 137  K 3.4*  CL 98  CO2 30  GLUCOSE 132*  BUN 10  CREATININE 0.87  CALCIUM 9.0     Intake/Output Summary (Last 24 hours) at 03/06/2022 0819 Last data filed at 03/06/2022 2595 Gross per 24 hour  Intake 600 ml  Output 1050 ml  Net -450 ml         Physical Exam: Vital Signs Blood pressure (!) 99/59, pulse 86, temperature 98.1 F (36.7 C), resp. rate 17, height 5\' 5"  (1.651 m), weight (!) 198.9 kg, SpO2 93 %. Gen: no distress, normal appearing, working with therapy in her room HEENT: oral mucosa pink and moist, EOMI,  NCAT Cardio: RRR Chest: CTAB, normal effort, normal rate of breathing Abd: soft, non-distended, +BS Ext: no edema Psych: pleasant, normal affect Skin: intact Neuro:  Patient is alert and awake.  Makes eye contact with examiner and follows commands.  Provides name and age.  Fair insight and awareness. FNT intact.  Strength 5/5 in b/l UE and LLE Strength  4-/5 proximal RLE to 4+/5 distal RLE Altered sensation L thigh  Musculoskeletal: Tenderness at b/l hips, no joint swelling noted     Assessment/Plan: 1. Functional deficits which require 3+ hours per day of interdisciplinary therapy in a comprehensive inpatient rehab setting. Physiatrist is providing close team supervision and 24 hour management of active medical problems listed below. Physiatrist and rehab team continue to assess barriers to discharge/monitor patient progress toward functional and medical goals  Care Tool:  Bathing    Body parts  bathed by patient: Right arm, Left arm, Chest, Front perineal area, Face, Abdomen, Left upper leg, Right upper leg   Body parts bathed by helper: Right lower leg, Left lower leg, Buttocks     Bathing assist Assist Level: Moderate Assistance - Patient 50 - 74%     Upper Body Dressing/Undressing Upper body dressing   What is the patient wearing?: Pull over shirt    Upper body assist Assist Level: Contact Guard/Touching assist    Lower Body Dressing/Undressing Lower body dressing      What is the patient wearing?: Pants     Lower body assist Assist for lower body dressing: Moderate Assistance - Patient 50 - 74%     Toileting Toileting    Toileting assist Assist for toileting: Minimal Assistance - Patient > 75%     Transfers Chair/bed transfer  Transfers assist     Chair/bed transfer assist level: Contact Guard/Touching assist     Locomotion Ambulation   Ambulation assist      Assist level: Minimal Assistance - Patient > 75% Assistive device: Walker-rolling Max distance: 35   Walk 10 feet activity   Assist     Assist level: Contact Guard/Touching assist Assistive device: Walker-rolling  Walk 50 feet activity   Assist Walk 50 feet with 2 turns activity did not occur: Safety/medical concerns (fatigue)         Walk 150 feet activity   Assist Walk 150 feet activity did not occur: Safety/medical concerns (fatigue)         Walk 10 feet on uneven surface  activity   Assist Walk 10 feet on uneven surfaces activity did not occur: Safety/medical concerns (fatigue)         Wheelchair     Assist Is the patient using a wheelchair?: Yes Type of Wheelchair: Manual    Wheelchair assist level: Minimal Assistance - Patient > 75% Max wheelchair distance: 10    Wheelchair 50 feet with 2 turns activity    Assist        Assist Level: Total Assistance - Patient < 25%   Wheelchair 150 feet activity     Assist      Assist  Level: Total Assistance - Patient < 25%   Blood pressure (!) 99/59, pulse 86, temperature 98.1 F (36.7 C), resp. rate 17, height 5\' 5"  (1.651 m), weight (!) 198.9 kg, SpO2 93 %.  Medical Problem List and Plan: 1. Functional deficits secondary to debility/morbid obesity/right lower extremity cellulitis             -patient may  shower             -ELOS/Goals: 1/2 Mod I/supervision             -Continue [facility] bed has been ordered   -Working on getting up 4 steps, this is need to get into and out of home  2.  Antithrombotics: -DVT/anticoagulation:  Pharmaceutical: Eliquis             -antiplatelet therapy: N/A 3. Lower extremity pain: Lyrica 100 mg twice daily, oxycodone as needed- increased to q4H prn 4. Mood/Behavior/Sleep: BuSpar 10 mg twice daily             -antipsychotic agents: Provide emotional support             -Hx of bipolar 1 disorder             -DC atarax as she reports this caused Qtc/cardiac issues in past 5. Neuropsych/cognition: This patient is capable of making decisions on her own behalf. 6. Skin/Wound Care: Routine skin checks 7. Fluids/Electrolytes/Nutrition: Routine in and outs with follow-up chemistries 8.  Right leg cellulitis.  Completing course 5 days Ancef. 9.  Chronic combined CHF.  Bumex 2 mg daily, Aldactone 25 mg daily.,  Cozaar 25 mg daily. monitor for any fluid overload.  -No signs of fluid overload at this time  -Restart bumex  -COZAAR discontinued Filed Weights   03/03/22 0934  Weight: (!) 198.9 kg    10.  Atrial fibrillation/prolonged QT  Continue Eliquis 5 mg twice daily, Tikosyn 500 mg twice daily, Inderal 80 mg 3 times daily 11.  Diabetes mellitus.  Hemoglobin A1c 6.1.  SSI.  Consider restart Glucotrol 10 mg twice daily. Prior use of Ozempic/Dupixent? Resume as needed needed  -12/25 CBGs well controlled, continue to monitor  -12/26 Called pharmacy regarding Ozempic, family to bring medicine in from home for tomorrow/Thursday CBG  (last 3)   -12/27 well controlled, she asked about restarting glucotrol, will hold off on this as CBGs well controlled and would not want to increase risk of hypoglycemia  -12/28 had ozembic yesterday Recent Labs    03/05/22 1637 03/05/22 2033 03/06/22  [place]  Morbid obesity.  BMI 86.80.  Dietary follow-up.  History of gastric sleeve 2016 and nonadherence to diet. 13.  GERD.  Protonix 14.  Iron deficiency anemia  -12/25 stable at HGB 10.5 15. Constipation: add slow mag. Resolved.  16. R chronic thigh pain. Reported to have hx of lumbar radiculopathy. Pain in this area could also be lateral femoral cutaneous nerve irritation. Continue lyrica. 17. Hypotension/AKI: d/c Losartan, discuss with patient's cardiologist whether spironolactone can be decreased Monitor response to medication change  -12/28 Restart bumex, Cr down to 0.87/bun 10-stable    03/06/2022    8:29 AM 03/06/2022    8:07 AM 03/06/2022    4:50 AM  Vitals with BMI  Systolic [id number]  99  Diastolic 61  59  Pulse 71 86 70    18. Hypokalemia:   -K+ stable at 3.8 after 55meq yesterday, start 57meq daily supplement  -12/18 K+3.4 will give 75meq today, increase daily supplement to 30meq 19. Nausea:   -Improved, continue scopolamine patch for now 20. Eczema   -Dupixent-due today 12/28   LOS: 6 days A FACE TO FACE EVALUATION WAS PERFORMED  Jennye Boroughs 03/06/2022, 8:19 AM

## 2022-03-06 NOTE — Progress Notes (Signed)
Physical Therapy Session Note  Patient Details  Name: Alyssa Blanchard MRN: [id number] Date of Birth: 09-06-1970  Today's Date: 03/06/2022 PT Individual Time: 1st Treatment Session: 0845-1000; 2nd Treatment Session: 1130-1200 PT Individual Time Calculation (min): 75 min; 30 min  Short Term Goals: Week 1:  PT Short Term Goal 1 (Week 1): STG = LTG 2/2 LOS  Skilled Therapeutic Interventions/Progress Updates:  1st Treatment Session- Patient greeted supine in bed with RN present administering morning medications. Patient reporting increased frustration this morning with staff and reassured that it has nothing to do with her- Therapist and patient took deep breaths together in order to promote relaxation and reduce frustration. Patient transitioned from semi-reclined to sitting EOB ModI with use of bed rail. Patient then stood and ambulated to the other side of the bed with HDRW and distant supv. While seated on opposite side of the bed patient performed her morning routine of donning clothes, moisturizer, body spray etc without the assistance of therapist. Patient ambulated to the sink with HDRW where she stood ~5' minutes to wash her face, brush her teeth and apply deodorant. Patient returned to sitting EOB where she continued to get dressed. Therapist donned tennis shoes and socks while sitting EOB for time management.   Patient gait trained x20' from her room to wheelchair parked in the hallway with HDRW and distant supv. Patient then dependently wheeled to rehab gym for time management and energy conservation.   Patient ascended/descended x4 steps, x2 trials with B HR and CGA for safety- Patient ascended with L LE leading and descended with R LE while using a step-to pattern. Patient required an extended seated rest break in between secondary to poor endurance/activity tolerance. Therapist wrapped L knee with 2 ace wraps in order to improve overall stability/comfort with good results noted-  Therapist was able to find a knee brace on [employer] in the patient's size (above knee 28" and below knee 22" circumference).   Patient returned to her room and ambulated from wheelchair to sitting EOB (~15') and distant supv- Patient's personal HDRW was delivered and therapist adjusted to her height. Patient left sitting EOB with call bell within reach and all needs met.   2nd Treatment Session- Patient greeted sitting upright in wheelchair in room and agreeable to PT treatment session.   Patient attempted to ascend/descend the stairs sideways with one HR, however once ascending one step patient reported not feeling comfortable due to both feet not fitting on the steps due to body habitus. Patient then ascended/descended x2 step with R HR and CGA. Patient led with L LE with L knee ace wrapped for improved stability and reported improved comfort. Patient then ascended/descended one 6" step with R HR x4 with extended seated rest break in between trials secondary to poor endurance/activity tolerance.   Patient returned to her room and ambulated from her wheelchair to sitting EOB (~15') with HDRW and distant supv. Patient left sitting EOB with call bell within reach, all needs met and RN present to administer medication.    Therapy Documentation Precautions:  Precautions Precautions: Fall Restrictions Weight Bearing Restrictions: No   Therapy/Group: Individual Therapy  Alyssa Blanchard 03/06/2022, 7:50 AM

## 2022-03-06 NOTE — Progress Notes (Signed)
Occupational Therapy Session Note  Patient Details  Name: Alyssa Blanchard MRN: [id number] Date of Birth: 01/01/1971  Today's Date: 03/06/2022 OT Individual Time: 7939-0300 OT Individual Time Calculation (min): 44 min    Short Term Goals: Week 1:  OT Short Term Goal 1 (Week 1): pt will completed LB dressing min A using AD/AE PRN OT Short Term Goal 2 (Week 1): Pt will tolerate standing >2 minutes for fucntional activity OT Short Term Goal 3 (Week 1): Pt will verbalice 3 energy conservation techniques to utilize upon d/c home  Skilled Therapeutic Interventions/Progress Updates:  Pt greeted supine in bed, pt agreeable to OT intervention. Session focus on BADL reeducation, functional mobility, dynamic standing balance, global strengthening/endurance and decreasing overall caregiver burden.                     Reapplied ace wrap to L knee per pt request. Pt completed supine>sit with supervision, CGA for stand pivot to [facility] to R side with RW and CGA. Pt completed 3/3 toileting tasks with CGA with + urine void.   Total A transport to gym where pt completed below therapeutic exercises to facilitate improved strength/endurance.   - x20 standing chest presses with 3.3 weighted ball with CGA with no LOB or UE support  - x10 OH presses with 3.3 lb weighted ball with CGA with no LOB or UE support -x10 seated OH presses with 3.3 lb weighted ball  - of continuous BUE AROM with pt holding 3 lb dowel rod to toss beach ball back and forth with pt needing rest break at 1 min mark d/t fatigue.    Ended session with pt seated in w/c with all needs within reach.            Therapy Documentation Precautions:  Precautions Precautions: Fall Restrictions Weight Bearing Restrictions: No  Pain: unrated pain reported in chest from therex, stretching and rest breaks provided.     Therapy/Group: Individual Therapy  Barron Schmid 03/06/2022, 12:18 PM

## 2022-03-07 DIAGNOSIS — I5022 Chronic systolic (congestive) heart failure: Secondary | ICD-10-CM

## 2022-03-07 DIAGNOSIS — F3174 Bipolar disorder, in full remission, most recent episode manic: Secondary | ICD-10-CM

## 2022-03-07 LAB — BASIC METABOLIC PANEL
Anion gap: 13 (ref 5–15)
BUN: 9 mg/dL (ref 6–20)
CO2: 25 mmol/L (ref 22–32)
Calcium: 9.2 mg/dL (ref 8.9–10.3)
Chloride: 97 mmol/L — ABNORMAL LOW (ref 98–111)
Creatinine, Ser: 1.08 mg/dL — ABNORMAL HIGH (ref 0.44–1.00)
GFR, Estimated: >60 mL/min (ref >60)
Glucose, Bld: 126 mg/dL — ABNORMAL HIGH (ref 70–99)
Potassium: 3.7 mmol/L (ref 3.5–5.1)
Sodium: 135 mmol/L (ref 135–145)

## 2022-03-07 LAB — GLUCOSE, CAPILLARY
Glucose-Capillary: 115 mg/dL — ABNORMAL HIGH (ref 70–99)
Glucose-Capillary: 128 mg/dL — ABNORMAL HIGH (ref 70–99)
Glucose-Capillary: 111 mg/dL — ABNORMAL HIGH (ref 70–99)
Glucose-Capillary: 129 mg/dL — ABNORMAL HIGH (ref 70–99)

## 2022-03-07 MED ORDER — ZINC OXIDE 20 % EX OINT
TOPICAL_OINTMENT | CUTANEOUS | Status: DC | PRN
  Filled 2022-03-07: qty 28.35

## 2022-03-07 MED ORDER — PROPRANOLOL HCL 20 MG PO TABS
60.0000 mg | ORAL_TABLET | Freq: Three times a day (TID) | ORAL | Status: DC
  Administered 2022-03-07 – 2022-03-11 (×13): 60 mg via ORAL
  Filled 2022-03-07 (×13): qty 3

## 2022-03-07 NOTE — Progress Notes (Signed)
Occupational Therapy Session Note  Patient Details  Name: Alyssa Blanchard MRN: [id number] Date of Birth: 1970/08/27  Today's Date: 03/07/2022 OT Individual Time: 9562-1308 OT Individual Time Calculation (min): 27 min    Short Term Goals: Week 1:  OT Short Term Goal 1 (Week 1): pt will completed LB dressing min A using AD/AE PRN OT Short Term Goal 2 (Week 1): Pt will tolerate standing >2 minutes for fucntional activity OT Short Term Goal 3 (Week 1): Pt will verbalice 3 energy conservation techniques to utilize upon d/c home  Skilled Therapeutic Interventions/Progress Updates:     Pt received sitting EOB finishing breakfast. In good spirits and receptive to skilled OT session. Pt reporting 8/10 pain in RLE- Rn in/out during session to provided morning meds and pain medication, pt offered repositioning and rest breaks during session to decrease pain.  Pt provided clothing for the day and doffed shirt EOB with set-up A. Pt requested to use restroom and transferred to [facility] CGA using RW- continent void on [facility] completing toilet hygiene and clothing management CGA. Pt ambulated within room using RW for functional mobility training. Pt donned pants EOB CGA when standing to bring pants to waist. Pt completed grooming hygiene tasks seated EOB to prevent increase in pain. Pt brushed her hair and teeth supervision with Pt able to maintain dynamic sitting balance during task. Total A to donned shoes and socks. Pt demonstrating increased independence in LB dressing, ambulation, and toileting tasks.  Pt was left resting EOB with call bell in reach, bed alarm on, and all needs met.   Therapy Documentation Precautions:  Precautions Precautions: Fall Restrictions Weight Bearing Restrictions: No  Pain: Pain Assessment Pain Score: Asleep ADL: ADL Eating: Set up Where Assessed-Eating: Bed level Grooming: Contact guard Where Assessed-Grooming: Standing at sink Upper Body Bathing: Minimal  assistance Lower Body Bathing: Moderate assistance Where Assessed-Lower Body Bathing: Edge of bed Upper Body Dressing: Setup Where Assessed-Upper Body Dressing: Edge of bed Lower Body Dressing: Moderate assistance Where Assessed-Lower Body Dressing: Edge of bed Toileting: Moderate assistance Where Assessed-Toileting: Bedside Commode Toilet Transfer: Therapist, music Method: Counselling psychologist: Radiographer, therapeutic: Not assessed Social research officer, government: Curator Method: Heritage manager: Shower seat with back     Therapy/Group: Individual Therapy  Janey Genta 03/07/2022, 6:56 AM

## 2022-03-07 NOTE — Progress Notes (Signed)
Occupational Therapy Session Note  Patient Details  Name: Alyssa Blanchard MRN: [id number] Date of Birth: [birth year]  Today's Date: 03/07/2022 OT Individual Time: 7342-8768 OT Individual Time Calculation (min): 74 min    Short Term Goals: Week 1:  OT Short Term Goal 1 (Week 1): pt will completed LB dressing min A using AD/AE PRN OT Short Term Goal 2 (Week 1): Pt will tolerate standing >2 minutes for fucntional activity OT Short Term Goal 3 (Week 1): Pt will verbalice 3 energy conservation techniques to utilize upon d/c home  Skilled Therapeutic Interventions/Progress Updates:  Skilled OT intervention completed with focus on toileting needs, BUE and ambulatory endurance. Pt received semi-supine in bed with nursing trying to assess a spot on pt's LRQ, with therapist jumping in to assist with lifting pt's abdomen/skin fold for ease of viewing due to body habitus, agreeable to session. No pain reported during session.  Pt completed bed mobility with heavy use of bed rails to EOB, sit > stand with mod I without AD and stand pivot with supervision to [facility] with therapist encouraging donning shoe or sock with grip prior to transferring for fall prevention however pt stated "just turn around and don't look, I really have to pee." Of note- pt had sign on door indicating mod I clearance in room. Able to handle all toileting steps for continent void with mod I.  Pt transitioned back to bed with mod I for nursing to place bandage on abdomen. Then ambulated with supervision using RW to w/c. Dependent transport in w/c > gym for energy conservation.  Mod I stand pivot to EOM, then seated completed the following exercises: (With green theraband) 12 reps Horizontal abduction Self-anchored shoulder flexion each arm Self-anchored tricep extension each arm Therapist anchored scapular retraction each arm Shoulder external rotation (With 7 lb dumbbell) 12 reps Bicep flexion each arm Chest  presses  Ambulated using RW about 75 ft to scale in gym per request, with supervision step on/off scale with reading of 433 lb. Pt ambulated halfway back then indicated she couldn't walk any further 2/2 fatigue and discomfort in low back despite encouragement therefore required w/c brought to her and dependent transport back to room. Mod I ambulatory transfer to EOB, then remained seated EOB with all needs met and in reach at end of session.  Therapy Documentation Precautions:  Precautions Precautions: Fall Restrictions Weight Bearing Restrictions: No    Therapy/Group: Individual Therapy  Blase Mess, MS, OTR/L  03/07/2022, 3:34 PM

## 2022-03-07 NOTE — Progress Notes (Signed)
PROGRESS NOTE   Subjective/Complaints: She would like a ambulatory referral to behavioral healthy.  She asked about propranolol, it has been held sometimes by nursing due to low BP.  She continues to have hip and knee pain.   ROS: +lower extremity pain,  + B/L Hip and knee pain. denies CP,SOB, Abdominal pain, + itching-chronic   Objective:   No results found. No results for input(s): "WBC", "HGB", "HCT", "PLT" in the last 72 hours.  Recent Labs    03/06/22 0552  NA 137  K 3.4*  CL 98  CO2 30  GLUCOSE 132*  BUN 10  CREATININE 0.87  CALCIUM 9.0     Intake/Output Summary (Last 24 hours) at 03/07/2022 1212 Last data filed at 03/07/2022 1113 Gross per 24 hour  Intake 580 ml  Output 3200 ml  Net -2620 ml         Physical Exam: Vital Signs Blood pressure (!) 101/55, pulse 69, temperature 97.8 F (36.6 C), resp. rate 17, height 5\' 5"  (1.651 m), weight (!) 198.9 kg, SpO2 94 %. Gen: no distress, normal appearing, working with therapy in her room HEENT: oral mucosa pink and moist, EOMI,  NCAT Cardio: RRR Chest: CTAB, normal effort, normal rate of breathing Abd: soft, non-distended, +BS Ext: no edema Psych: pleasant, normal affect Skin: intact Neuro:  Patient is alert and awake.  Makes eye contact with examiner and follows commands.  Provides name and age.  Fair insight and awareness. FNT intact.  Strength 5/5 in b/l UE and LLE Strength  4-/5 proximal RLE to 4+/5 distal RLE Altered sensation L thigh  Musculoskeletal: Tenderness at b/l hips, no joint swelling noted     Assessment/Plan: 1. Functional deficits which require 3+ hours per day of interdisciplinary therapy in a comprehensive inpatient rehab setting. Physiatrist is providing close team supervision and 24 hour management of active medical problems listed below. Physiatrist and rehab team continue to assess barriers to discharge/monitor patient progress  toward functional and medical goals  Care Tool:  Bathing    Body parts bathed by patient: Right arm, Left arm, Chest, Face, Abdomen, Left upper leg, Right upper leg, Right lower leg, Left lower leg   Body parts bathed by helper: Front perineal area, Buttocks     Bathing assist Assist Level: Minimal Assistance - Patient > 75%     Upper Body Dressing/Undressing Upper body dressing   What is the patient wearing?: Pull over shirt    Upper body assist Assist Level: Set up assist    Lower Body Dressing/Undressing Lower body dressing      What is the patient wearing?: Pants, Underwear/pull up     Lower body assist Assist for lower body dressing: Set up assist     Toileting Toileting    Toileting assist Assist for toileting: Set up assist     Transfers Chair/bed transfer  Transfers assist     Chair/bed transfer assist level: Contact Guard/Touching assist     Locomotion Ambulation   Ambulation assist      Assist level: Minimal Assistance - Patient > 75% Assistive device: Walker-rolling Max distance: 35   Walk 10 feet activity   Assist  Assist level: Contact Guard/Touching assist Assistive device: Walker-rolling   Walk 50 feet activity   Assist Walk 50 feet with 2 turns activity did not occur: Safety/medical concerns (fatigue)         Walk 150 feet activity   Assist Walk 150 feet activity did not occur: Safety/medical concerns (fatigue)         Walk 10 feet on uneven surface  activity   Assist Walk 10 feet on uneven surfaces activity did not occur: Safety/medical concerns (fatigue)         Wheelchair     Assist Is the patient using a wheelchair?: Yes Type of Wheelchair: Manual    Wheelchair assist level: Minimal Assistance - Patient > 75% Max wheelchair distance: 10    Wheelchair 50 feet with 2 turns activity    Assist        Assist Level: Total Assistance - Patient < 25%   Wheelchair 150 feet activity      Assist      Assist Level: Total Assistance - Patient < 25%   Blood pressure (!) 101/55, pulse 69, temperature 97.8 F (36.6 C), resp. rate 17, height 5\' 5"  (1.651 m), weight (!) 198.9 kg, SpO2 94 %.  Medical Problem List and Plan: 1. Functional deficits secondary to debility/morbid obesity/right lower extremity cellulitis             -patient may  shower             -ELOS/Goals: 1/2 Mod I/supervision             -Continue [facility] bed has been ordered   -Reports she did well with steps yesterday!  2.  Antithrombotics: -DVT/anticoagulation:  Pharmaceutical: Eliquis             -antiplatelet therapy: N/A 3. Lower extremity pain: Lyrica 100 mg twice daily, oxycodone as needed- increased to q4H prn 4. Mood/Behavior/Sleep: BuSpar 10 mg twice daily             -antipsychotic agents: Provide emotional support             -Hx of bipolar 1 disorder             -DC atarax as she reports this caused Qtc/cardiac issues in past 5. Neuropsych/cognition: This patient is capable of making decisions on her own behalf. 6. Skin/Wound Care: Routine skin checks 7. Fluids/Electrolytes/Nutrition: Routine in and outs with follow-up chemistries 8.  Right leg cellulitis.  Completing course 5 days Ancef. 9.  Chronic combined CHF.  Bumex 2 mg daily, Aldactone 25 mg daily.,  Cozaar 25 mg daily. monitor for any fluid overload.  -No signs of fluid overload at this time  -Restart bumex  -COZAAR discontinued  -Will ask nursing to recheck weight Filed Weights   03/03/22 0934  Weight: (!) 198.9 kg    10.  Atrial fibrillation/prolonged QT  Continue Eliquis 5 mg twice daily, Tikosyn 500 mg twice daily, Inderal 80 mg 3 times daily 11.  Diabetes mellitus.  Hemoglobin A1c 6.1.  SSI.  Consider restart Glucotrol 10 mg twice daily. Prior use of Ozempic/Dupixent? Resume as needed needed  -12/25 CBGs well controlled, continue to monitor  -12/26 Called pharmacy regarding Ozempic, family to bring medicine  in from home for tomorrow/Thursday CBG (last 3)   -12/27 well controlled, she asked about restarting glucotrol, will hold off on this as CBGs well controlled and would not want to increase risk of hypoglycemia  -12/28 had ozembic yesterday  -12/29  well controlled Recent Labs    03/06/22 2107 03/07/22 0609 03/07/22 1207  GLUCAP 154* 115* 128*    12.  Morbid obesity.  BMI 86.80.  Dietary follow-up.  History of gastric sleeve 2016 and nonadherence to diet. 13.  GERD.  Protonix 14.  Iron deficiency anemia  -12/25 stable at HGB 10.5 15. Constipation: add slow mag. Resolved.  16. R chronic thigh pain. Reported to have hx of lumbar radiculopathy. Pain in this area could also be lateral femoral cutaneous nerve irritation. Continue lyrica. 17. Hypotension/AKI: d/c Losartan, discuss with patient's cardiologist whether spironolactone can be decreased Monitor response to medication change  -12/28 Restart bumex, Cr down to 0.87/bun 10-stable  -12/29 will decrease propranolol from 80mg  to 60mg  TID, hold if SBP <100    03/07/2022    2:49 AM 03/06/2022   10:53 PM 03/06/2022    7:37 PM  Vitals with BMI  Systolic 101 114 03/08/2022  Diastolic 55 47 57  Pulse 69 68 74    18. Hypokalemia:   -K+ stable at 3.8 after 03/08/2022 yesterday, start 149 daily supplement  -12/18 K+3.4 will give today, increase daily supplement to 1/19  -Recheck BMP  19. Nausea:   -Improved, continue scopolamine patch for now 20. Eczema   -Dupixent-due today 12/28   LOS: 7 days A FACE TO FACE EVALUATION WAS PERFORMED  03/07/2022, 12:12 PM

## 2022-03-07 NOTE — Discharge Summary (Incomplete)
Physician Discharge Summary  Patient ID: Alyssa Blanchard MRN: [id number] DOB/AGE: August 17, 1970 51 y.o.  Admit date: 02/28/2022 Discharge date: 03/11/2022  Discharge Diagnoses:  Principal Problem:   Debility DVT prophylaxis Pain management Mood stabilization/Bi Polar  Right leg cellulitis Chronic combined CHF Atrial fibrillation Diabetes mellitus Morbid obesity GERD Iron deficiency anemia Constipation  Discharged Condition: Stable  Significant Diagnostic Studies: DG Hip Unilat W or Wo Pelvis 2-3 Views Right  Result Date: 02/16/2022 CLINICAL DATA:  Right hip pain and right leg pain EXAM: DG HIP (WITH OR WITHOUT PELVIS) 2-3V RIGHT COMPARISON:  None Available. FINDINGS: There is no evidence of hip fracture or dislocation. There is no evidence of arthropathy or other focal bone abnormality. IMPRESSION: Negative. Electronically Signed   By: [name] M.D.   On: 02/16/2022 06:30    Labs:  Basic Metabolic Panel: Recent Labs  Lab 03/02/22 1203 03/03/22 0656 03/04/22 0829 03/06/22 0552  NA 130* 135  --  137  K 3.7 3.8  --  3.4*  CL 94* 99  --  98  CO2 26 23  --  30  GLUCOSE 115* 118*  --  132*  BUN 15 15  --  10  CREATININE 1.05* 1.06*  --  0.87  CALCIUM 9.1 9.2  --  9.0  MG  --   --  2.0 2.0    CBC: Recent Labs  Lab 03/03/22 0656  WBC 5.9  NEUTROABS 3.8  HGB 10.5*  HCT 34.6*  MCV 88.3  PLT 202    CBG: Recent Labs  Lab 03/05/22 2033 03/06/22 0535 03/06/22 1202 03/06/22 1603 03/06/22 2107  GLUCAP 110* 125* 117* 115* 154*   Family history.  Mother with diabetes hypertension as well as hyperlipidemia and stage IV kidney disease.  Father with pancreatic cancer.  Denies any colon cancer esophageal cancer or rectal cancer  Brief HPI:   Alyssa Blanchard is a 51 y.o. right-handed female with history of chronic anemia lumbar radiculopathy fibromyalgia morbid obesity with gastric sleeve 2016 and nonadherence to diet, atrial fibrillation maintained on Eliquis as  well as Tikosyn followed by Dr. Carlis Stable, bipolar disorder reportedly stopped all psychiatric meds 2/2 secondary to side effects and currently not seeing a psychiatrist, chronic combined CHF with ejection fraction of 30 to 35% as well as diabetes mellitus.  Patient admitted in October for recurrent episodes of CHF discharge to skilled nursing facility for 4 weeks and then discharged to home.  Per chart review lives with her mother.  Mother is limited physically.  [facility]16/2022 with increasing swelling of lower extremities and recent fall with complaints of tenderness of the right hip and erythema posterior right leg and knee.  Admission chemistries BNP 120 sodium 134 glucose 138 hemoglobin 8.7 lactic acid 2.0-2.4, urinalysis negative.  X-rays of right hip and leg showed no evidence of fracture or dislocation.  EKG showed atrial fibrillation with a rate of 82.  Placed on intravenous Ancef for cellulitis right proximal lower extremity completed 5-day course.  She remained on chronic Lyrica for lumbar radiculopathy as well as oxycodone as needed.  She also had bilateral hip soreness she attributes to walking with therapies.  MRI not completed due to body habitus.  She reports cellulitis redness resolving.  Therapy evaluations completed due to patient decreased functional mobility was admitted for a comprehensive rehab program.   Hospital Course: Alyssa Blanchard was admitted to rehab 02/28/2022 for inpatient therapies to consist of PT, ST and OT  at least three hours five days a week. Past admission physiatrist, therapy team and rehab RN have worked together to provide customized collaborative inpatient rehab.  Pertaining to patient debility morbid obesity right lower extremity cellulitis she continued to improve attending full therapies.  Completed a 5-day course of Ancef for cellulitis.  She remained afebrile.  Maintained on Eliquis for history of atrial fibrillation cardiac rate  controlled.  She would follow-up outpatient cardiology services and remained on Tikosyn as well as Inderal.  Chronic combined CHF with Bumex and Aldactone as advised exhibiting no signs of fluid overload.  She had been on Cozaar discontinued as blood pressures was somewhat soft and mild AKI.  Mood stabilization maintained on BuSpar she would need outpatient follow-up.  Pain managed with use of scheduled Lyrica as well as oxycodone for breakthrough pain.  Morbid obesity BMI 86.60 dietary follow-up.  Blood sugars monitored hemoglobin A1c 6.1 maintained on Dupilumab every 7 days.  She had been on Glucotrol which was held because blood sugars were so well-controlled could be resumed as outpatient as needed.   Blood pressures were monitored on TID basis and soft and monitored  Diabetes has been monitored with ac/hs CBG checks and SSI was use prn for tighter BS control.    Rehab course: During patient's stay in rehab weekly team conferences were held to monitor patient's progress, set goals and discuss barriers to discharge. At admission, patient required minimal guard 24 feet rolling walker minimal guard sit to stand  Physical exam.  Blood pressure 119/46 pulse 77 temperature 97.7 respirations 18 oxygen saturations 100% room air Constitutional.  No acute distress HEENT Head.  Normocephalic and atraumatic Eyes.  Pupils round and reactive to light no discharge without nystagmus Neck.  Supple nontender no JVD without thyromegaly Cardiac regular rate and rhythm without any extra sounds or murmur heard Abdomen.  Soft nontender positive bowel sounds without rebound Respiratory effort normal no respiratory distress without wheeze Skin.  Warm and dry Extremities.  No clubbing cyanosis or edema Neurologic.  Alert oriented x 3 Strength 5/5 bilateral upper and left lower extremity Strength 4 -/5 proximal right lower extremity to 4+/5 distal right lower extremity  He/She  has had improvement in activity  tolerance, balance, postural control as well as ability to compensate for deficits. He/She has had improvement in functional use RUE/LUE  and RLE/LLE as well as improvement in awareness.  Transition from semireclined in chair to sitting edge of bed modified independent.  She can stand and ambulate to the other side of the bed with HDRW and distant supervision.  Patient a Atilano Ina and descends 4 steps contact-guard.  Completes supine to sit supervision.  Contact-guard stand pivot to bedside commode.  Full family teaching completed plan discharge to home       Disposition: Discharge to home    Diet: Diabetic diet  Special Instructions: No driving smoking or alcohol  Ambulatory referral obtained with [place]  Medications at discharge. 1.  Tylenol as needed 2.  Eliquis 5 mg p.o. twice daily 3.  Bumex 1 mg daily at 1415 and 2 mg daily at 0800 4.  BuSpar 10 mg p.o. twice daily 5.  Tikosyn 500 mcg p.o. twice daily 6.  DUpilumab 300 mg subcutaneous every 14 days 7.  Breo Ellipta 1 puff daily 8.  Slow-Mag 64 mg daily 9.  Melatonin 10 mg nightly 10.  Oxycodone 5 mg every 4 hours as needed pain 11.  Protonix 40 mg p.o. twice daily 12.  Aldactone 25 mg p.o. daily 13.  Lyrica 100 mg p.o. twice daily 14.  Inderal 80 mg p.o. 3 times daily 15.  Scopolamine patch change every 72 hours  16.Semaglutide 0.5 mg subcutaneous every Wednesday   30-35 minutes were spent completing discharge summary and discharge planning      Follow-up Information     Fanny Dance, MD Follow up.   Specialty: Physical Medicine and Rehabilitation Why: No Formal follow-up needed Contact information: [address] [place] [address] [phone]         [facility] Follow up.   Why: Call for appointment Contact information: [address] [place] [address] [phone]         Randel Pigg, Dorma Russell, MD Follow up.   Specialty: Family Medicine Contact  information: [address] [place] [address] [phone]                 Signed: Charlton Amor 03/07/2022, 5:49 AM

## 2022-03-07 NOTE — Progress Notes (Signed)
Physical Therapy Session Note  Patient Details  Name: Alyssa Blanchard MRN: [id number] Date of Birth: 07-09-1970  Today's Date: 03/07/2022 PT Individual Time: 1st Treatment Session: [id number]; 2nd Treatment Session: 1100-1200  PT Individual Time Calculation (min): 45 min; 60 min  Short Term Goals: Week 1:  PT Short Term Goal 1 (Week 1): STG = LTG 2/2 LOS  Skilled Therapeutic Interventions/Progress Updates:  1st Treatment Session- Patient greeted sitting EOB and agreeable to PT treatment session. Patient reporting increased L knee pain secondary to stair mobility yesterday and deferred stair training this AM, however agreeable to perform this afternoon. Patient performed sit/stand and ambulated toward the day room gym with HDRW and distant supv (therapist brought wheelchair secondary to fatigue)- Patient was able to ambulate ~75' while communicating with physician on his morning rounds. Patient required a seated rest break secondary to fatigue. Patient performed sit/stand x10 trials without UE support and supv- Patient required increased time to complete secondary to fatigue. Patient then gait trained x97' and x65' with HDRW and distant supv. Patient returned to her room where she performed sit/stand and then ambulated to her bed with HDRW and distant supv. Patient left sitting EOB with tray table in front, call bell within reach and all needs met.    2nd Treatment Session- Patient greeted sitting EOB and agreeable to PT treatment session. Patient requesting to use the restroom prior to heading to the rehab gym. Patient was able to perform sit/stand and ambulate to bedside commode with personal HDRW ModI- Patient was made ModI in the room for transfers and ambulation in order to prepare for discharge home. Patient then ambulated ~45' with HDRW and distant supv into the hallway where she sat in the wheelchair. Patient wheeled dependently to rehab gym for energy conservation and time management.  Patient ascended/descended x4 steps with S HR and CGA- Patient ascended using the R HR and descended using the L HR. While ascending patient led with L LE and then descended with R LE with a step-to pattern. Therapist ace wrapped L knee for improved stability and comfort. Patient required an extended seated rest break after stair mobility secondary to notable fatigue. Patient gait trained x150' with HDRW and distant supv- Patient required x3 standing rest breaks throughout gait trial with VC for pursed lip breathing as she fatigued. Patient required an extended seated rest break after gait trial secondary to notable SOB. Patient returned to her room sitting EOB with call bell within reach and all needs met.    Therapy Documentation Precautions:  Precautions Precautions: Fall Restrictions Weight Bearing Restrictions: No   Therapy/Group: Individual Therapy  Mel Langan 03/07/2022, 7:53 AM

## 2022-03-08 ENCOUNTER — Inpatient Hospital Stay (HOSPITAL_COMMUNITY): Payer: Medicaid Other

## 2022-03-08 LAB — GLUCOSE, CAPILLARY
Glucose-Capillary: 143 mg/dL — ABNORMAL HIGH (ref 70–99)
Glucose-Capillary: 99 mg/dL (ref 70–99)
Glucose-Capillary: 113 mg/dL — ABNORMAL HIGH (ref 70–99)
Glucose-Capillary: 226 mg/dL — ABNORMAL HIGH (ref 70–99)

## 2022-03-08 MED ORDER — METHYLPREDNISOLONE ACETATE 40 MG/ML IJ SUSP
80.0000 mg | Freq: Once | INTRAMUSCULAR | Status: AC
  Administered 2022-03-08: 80 mg via INTRA_ARTICULAR
  Filled 2022-03-08: qty 2

## 2022-03-08 MED ORDER — LIDOCAINE HCL (PF) 1 % IJ SOLN
5.0000 mL | Freq: Once | INTRAMUSCULAR | Status: AC
  Administered 2022-03-08: 5 mL
  Filled 2022-03-08 (×2): qty 5

## 2022-03-08 NOTE — Progress Notes (Signed)
Pt states does not wear cpap

## 2022-03-08 NOTE — Consult Note (Signed)
Reason for Consult:Left knee pain Referring Physician: Genice Rouge, MD   Alyssa Blanchard is an 51 y.o. female.  HPI: She is currently in inpatient rehab and working through several medical issues.  As she has been recovering and planning for discharge, she has noticed worsening left knee pain.  She denies any injury or event that led to the pain.  She did have right lower extremity cellulitis which has resolved but it did not affect the left lower extremity.  She does state that she has a history of knee injuries in her youth and has had an aspiration of knee fluid in the past. Alyssa Blanchard ongoing left knee pain is affecting her mobility and ability to navigate stairs which is crucial for her discharge.  Orthopedics was consult for further evaluation and management of her ongoing left knee pain.She is currently working PT/OT and is ambulatory with the use of a walker.  She feels her right lower extremity cellulitis has resolved.  Denies any recent injury. Pain is isolated to the left knee. She does have a BMI greater than 70 does have issues with ambulating at baseline.  Past Medical History:  Diagnosis Date   Anxiety    Asthma    Atrial fibrillation (HCC)    Depression    DM (diabetes mellitus), type 2 (HCC) 02/16/2022   Dysrhythmia    new onset Afib rvr   GERD (gastroesophageal reflux disease)    Heart failure (HCC)    Heel spur    bilat   History of bronchitis    History of chicken pox    History of urinary tract infection    Migraines    Plantar fasciitis    bilat   STD (sexually transmitted disease)    chl hx & hsv 1&2   Tremors of nervous system    Urinary incontinence     Past Surgical History:  Procedure Laterality Date   CARDIOVERSION N/A 08/05/2019   Procedure: CARDIOVERSION;  Surgeon: Nahser, Deloris Ping, MD;  Location: [facility] ENDOSCOPY;  Service: Cardiovascular;  Laterality: N/A;   LAPAROSCOPIC GASTRIC SLEEVE RESECTION N/A 11/27/2014   Procedure: LAPAROSCOPIC GASTRIC SLEEVE  RESECTION WITH HIATAL HERNIA REPAIR UPPER ENDOSCOPY;  Surgeon: Luretha Murphy, MD;  Location: [facility] ORS;  Service: General;  Laterality: N/A;   TEE WITHOUT CARDIOVERSION N/A 08/05/2019   Procedure: TRANSESOPHAGEAL ECHOCARDIOGRAM (TEE);  Surgeon: [name], Deloris Ping, MD;  Location: [facility] ENDOSCOPY;  Service: Cardiovascular;  Laterality: N/A;   TONSILLECTOMY  1978    Family History  Problem Relation Age of Onset   Diabetes Mother    Hypertension Mother    Hyperlipidemia Mother    Kidney disease Mother        CKD stage 4   Cancer Father        pancreatic   Hypertension Maternal Grandmother    Diabetes Maternal Grandmother    Heart failure Maternal Grandmother        CHF   Heart attack Maternal Grandfather    Hypertension Maternal Grandfather    Hypertension Paternal Grandmother    Alzheimer's disease Paternal Grandmother    Hypertension Paternal Grandfather    Cancer Maternal Uncle        melanoma   Cancer Paternal Uncle        kidney    Social History:  reports that she quit smoking about 10 years ago. Her smoking use included cigarettes. She has a 1.25 pack-year smoking history. She has never used smokeless tobacco. She reports that she does not  currently use alcohol. She reports that she does not currently use drugs after having used the following drugs: Marijuana and Cocaine.  Allergies:  Allergies  Allergen Reactions   Gabapentin Anaphylaxis   Topiramate Other (See Comments)    Jaw tightness and chest discomfort    Advair Diskus [Fluticasone-Salmeterol]    Hydroxyzine     QT prolongation related   Imitrex [Sumatriptan] Other (See Comments)    Increased heart rate   Latex Itching   Montelukast Other (See Comments)    Nightmares    Other Itching and Other (See Comments)    Pt states "no narcotics ever" 04/13/2019- denies this entry in 01/2020 "Mental health meds, in general, don't agree with me" Plastic and artificial Christmas trees = Itching   Prozac [Fluoxetine] Other (See  Comments)    Bad, vivid dreams   Trazodone And Nefazodone Other (See Comments)    Bad dreams 01/27/20 Pt is unsure if allergic to Nefazodone   Copper-Containing Compounds Rash and Other (See Comments)    Makes the face burn, also    Medications: I have reviewed the patient's current medications.  Results for orders placed or performed during the hospital encounter of 02/28/22 (from the past 48 hour(s))  Glucose, capillary     Status: Abnormal   Collection Time: 03/06/22  4:03 PM  Result Value Ref Range   Glucose-Capillary 115 (H) 70 - 99 mg/dL    Comment: Glucose reference range applies only to samples taken after fasting for at least 8 hours.  Glucose, capillary     Status: Abnormal   Collection Time: 03/06/22  9:07 PM  Result Value Ref Range   Glucose-Capillary 154 (H) 70 - 99 mg/dL    Comment: Glucose reference range applies only to samples taken after fasting for at least 8 hours.  Glucose, capillary     Status: Abnormal   Collection Time: 03/07/22  6:09 AM  Result Value Ref Range   Glucose-Capillary 115 (H) 70 - 99 mg/dL    Comment: Glucose reference range applies only to samples taken after fasting for at least 8 hours.  Glucose, capillary     Status: Abnormal   Collection Time: 03/07/22 12:07 PM  Result Value Ref Range   Glucose-Capillary 128 (H) 70 - 99 mg/dL    Comment: Glucose reference range applies only to samples taken after fasting for at least 8 hours.  Basic metabolic panel     Status: Abnormal   Collection Time: 03/07/22 12:29 PM  Result Value Ref Range   Sodium 135 135 - 145 mmol/L   Potassium 3.7 3.5 - 5.1 mmol/L   Chloride 97 (L) 98 - 111 mmol/L   CO2 25 22 - 32 mmol/L   Glucose, Bld 126 (H) 70 - 99 mg/dL    Comment: Glucose reference range applies only to samples taken after fasting for at least 8 hours.   BUN 9 6 - 20 mg/dL   Creatinine, Ser 6.07 (H) 0.44 - 1.00 mg/dL   Calcium 9.2 8.9 - 37.1 mg/dL   GFR, Estimated >06 >26 mL/min    Comment:  (NOTE) Calculated using the CKD-EPI Creatinine Equation (2021)    Anion gap 13 5 - 15    Comment: Performed at [facility], [address], [place], [place] [address]  Glucose, capillary     Status: Abnormal   Collection Time: 03/07/22  4:51 PM  Result Value Ref Range   Glucose-Capillary 111 (H) 70 - 99 mg/dL    Comment:  Glucose reference range applies only to samples taken after fasting for at least 8 hours.  Glucose, capillary     Status: Abnormal   Collection Time: 03/07/22  9:35 PM  Result Value Ref Range   Glucose-Capillary 129 (H) 70 - 99 mg/dL    Comment: Glucose reference range applies only to samples taken after fasting for at least 8 hours.  Glucose, capillary     Status: Abnormal   Collection Time: 03/08/22  5:19 AM  Result Value Ref Range   Glucose-Capillary 143 (H) 70 - 99 mg/dL    Comment: Glucose reference range applies only to samples taken after fasting for at least 8 hours.   Comment 1 Notify RN   Glucose, capillary     Status: None   Collection Time: 03/08/22 11:28 AM  Result Value Ref Range   Glucose-Capillary 99 70 - 99 mg/dL    Comment: Glucose reference range applies only to samples taken after fasting for at least 8 hours.    DG Knee 1-2 Views Left  Result Date: 03/08/2022 CLINICAL DATA:  Pain and swelling of left knee. EXAM: LEFT KNEE - 1-2 VIEW COMPARISON:  None Available. FINDINGS: There is diffuse decreased bone mineralization. Severe lateral and moderate medial compartment joint space narrowing and peripheral osteophytosis. Moderate-to-large superior and inferior patellar degenerative osteophytes. Large posterior femoral condyle peripheral degenerative osteophytes. Moderate superior trochlear degenerative osteophytes. No definite joint effusion. No acute fracture is seen. No dislocation. 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 Electronically Signed   By: Neita Garnetonald  Viola M.D.   On: 03/08/2022 12:40     Review of Systems  Constitutional:  Negative for chills and fever.  HENT:  Negative for facial swelling and voice change.   Eyes:  Negative for discharge and redness.  Respiratory:  Negative for choking, shortness of breath and wheezing.   Musculoskeletal:  Positive for arthralgias.  Skin:  Negative for wound.  Neurological:  Negative for facial asymmetry, speech difficulty and weakness.  Psychiatric/Behavioral:  Negative for agitation, behavioral problems and confusion.    Blood pressure 120/84, pulse 82, temperature 98 F (36.7 C), resp. rate 16, height 5\' 5"  (1.651 m), weight (!) 197.5 kg, SpO2 98 %. Physical Exam Constitutional:      General: She is not in acute distress.    Appearance: She is obese.  HENT:     Head: Normocephalic and atraumatic.     Mouth/Throat:     Mouth: Mucous membranes are moist.  Eyes:     General: No scleral icterus.    Extraocular Movements: Extraocular movements intact.  Pulmonary:     Effort: No respiratory distress.  Musculoskeletal:        General: Tenderness present. No deformity.     Comments: Examination of the left knee shows tenderness to palpation over the anterior joint line.  Patella feels well located.  She is otherwise on to palpation throughout.  Overlying skin is benign.  Did not appreciate a significant effusion, although this is difficult to determine due to body habitus.  Range of motion is approximately 5-90 of flexion limited by body habitus.  There is mild crepitation noted throughout.  She has 5 out of 5 strength at the knee.  Her calf is soft and nontender.  The knee is stable to varus and valgus stress testing.  Neurovascular intact distally.  Skin:    General: Skin is warm and dry.     Findings: No erythema.  Neurological:  Mental Status: She is alert and oriented to person, place, and time.     Sensory: No sensory deficit.  Psychiatric:        Mood and Affect: Mood normal.        Behavior: Behavior normal.         Thought Content: Thought content normal.        Judgment: Judgment normal.    Nonweightbearing x-rays of the left knee were reviewed from today.  This shows advanced tricompartmental degenerative changes and osteophyte formation. No obvious fracture.  Procedure:  Risks, benefits, alternatives, and complications were reviewed with the patient. After obtaining informed verbal consent the left knee was sterilely prepped with Betadine and alcohol.Using strict sterile technique, a Left knee intra-articular injection was performed using an anterolateral approach and a 1-1/2 inch 23-gauge needle. A mixture of 80 mg Depo-Medrol and 4 ml of 1% lidocaine without epinephrine was injected Into the left knee joint without resistance.  The needle was withdrawn.  A Band-Aid was applied.  Patient tolerated the procedure well and was observed for 10 minutes. Post injection instructions, including signs and symptoms of complications were discussed.  Assessment/Plan: Left knee tricompartmental degenerative changes BMI greater than 70  Had a long discussion with Janella regarding her overall situation or ongoing left knee pain.  She does have significant degenerative changes but certainly  may be contributing to her difficulty with amatory activities and causing pain.  We discussed treatment options.  After discussion, she opted to proceed with intra-articular injection of cortisone.  Hopefully this provides her relief.  She is planning to obtain a knee brace as well. Encouraged her to continue with ambulatory efforts with the physical therapists.  She has no formal weightbearing restrictions from an orthopedic standpoint.  She may continue using assistive devices as needed.  Orthopedics will not plan to follow while patient is in-house, but will be happy to see her in the outpatient setting at [facility] orthopedics in 3 or 4 weeks time.  She may benefit from Visco supplementation, depending on her response to the cortisone  injection.  Orthopedics will sign off for now.  Lonnell Chaput J [place] 03/08/2022, 3:01 PM

## 2022-03-08 NOTE — Progress Notes (Signed)
Physical Therapy Session Note  Patient Details  Name: Alyssa Blanchard MRN: [id number] Date of Birth: February 05, 1971  Today's Date: 03/08/2022 PT Individual Time: 1002-1045 PT Individual Time Calculation (min): 43 min   Short Term Goals: Week 1:  PT Short Term Goal 1 (Week 1): STG = LTG 2/2 LOS  Skilled Therapeutic Interventions/Progress Updates:     Patient sitting EOB getting ready for the day upon PT arrival. Patient alert and agreeable to PT session. Patient reported 5/10 L knee and R thigh pain during session, RN made aware. PT provided repositioning, rest breaks, and distraction as pain interventions throughout session. Applied 2x6" ACE wraps to L knee for edema control and structural support to improve weight bearing tolerance.   Therapeutic Activity: Transfers: Patient performed sit to/from stand x5 with mod I using bariatric RW.   Gait Training:  Patient ambulated 90 feet and 15 feet using bariatric RW with supervision with w/c follow for reduced activity tolerance. Ambulated with decreased gait speed, decreased step length and height, decreased L weight shift and stance time, increased trunk sway, forward trunk lean, and downward head gaze. Provided verbal cues for erect posture, safe proximity to RW, and increased later hip activation in stance.  Neuromuscular Re-ed: Patient performed the following standing balance activities: -standing shooting a basket ball into a basket ball goal followed by alternating step-taps on 6" step 3x8-9 for 2 min bouts with supervision for balance with RW within reach for intermittent upper extremity support for balance -standing 2x30 sec, unable to tolerate static standing >30 sec, encouraged patient to move in standing to reduce knee pain  Patient in w/c waiting for NT to change bed sheets at end of session with breaks locked and all needs within reach.   Therapy Documentation Precautions:  Precautions Precautions: Fall Restrictions Weight  Bearing Restrictions: No    Therapy/Group: Individual Therapy  [name] L [name] PT, DPT, NCS, CBIS  03/08/2022, 12:54 PM

## 2022-03-08 NOTE — Progress Notes (Signed)
Occupational Therapy Session Note  Patient Details  Name: Alyssa Blanchard MRN: [id number] Date of Birth: 03/06/1971  Today's Date: 03/08/2022 OT Individual Time: 4580-9983 OT Individual Time Calculation (min): 30 min    Short Term Goals: Week 1:  OT Short Term Goal 1 (Week 1): pt will completed LB dressing min A using AD/AE PRN OT Short Term Goal 2 (Week 1): Pt will tolerate standing >2 minutes for fucntional activity OT Short Term Goal 3 (Week 1): Pt will verbalice 3 energy conservation techniques to utilize upon d/c home  Skilled Therapeutic Interventions/Progress Updates:  The pt was in bed in supine at the time of arrival, she indicated that she had a pain response of 7 on 0-10 scale with medication at 2:00PM.  The pt was able to come from supine to EOB with close S, the pt was able to transfer from EOB to the [facility] using the RW  and additional time with close S. The pt wa able to doff her LB clothing items, underwear and pants with close S, she was able to donn them at the same LOF. The pt was able to come from sit to stand using the arm rest of the [facility] and the RW to ambulate to the w/c with vc's and additional time. The pt was able to complete UB exercises using a single prong cane for shld flexion, horizontal abduction, and shld rotation 1 set of 15 with rest breaks as needed, the pt require 1 rest break.   The pt returned to bed LOF with close S transferring from the w/c to the bed.  The call light and bedside commode was in reach and all additional needs were addressed.  Therapy Documentation Precautions:  Precautions Precautions: Fall Restrictions Weight Bearing Restrictions: No  Therapy/Group: Individual Therapy  Alyssa Blanchard 03/08/2022, 3:39 PM

## 2022-03-08 NOTE — Progress Notes (Signed)
PROGRESS NOTE   Subjective/Complaints:  Pt reports L knee pain >R knee pain and now having swelling of L knee- she wondering if she needs ot have lfuid removed.  Otherwise doing pretty good- d/c 1/2 planned.  Also has some swelling around area had folliculitis -wondering what to do.    ROS:  Pt denies SOB, abd pain, CP, N/V/C/D, and vision changes Except per HPI   Objective:   DG Knee 1-2 Views Left  Result Date: 03/08/2022 CLINICAL DATA:  Pain and swelling of left knee. EXAM: LEFT KNEE - 1-2 VIEW COMPARISON:  None Available. FINDINGS: There is diffuse decreased bone mineralization. Severe lateral and moderate medial compartment joint space narrowing and peripheral osteophytosis. Moderate-to-large superior and inferior patellar degenerative osteophytes. Large posterior femoral condyle peripheral degenerative osteophytes. Moderate superior trochlear degenerative osteophytes. No definite joint effusion. No acute fracture is seen. No dislocation. 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 Electronically Signed   By: Neita Garnet M.D.   On: 03/08/2022 12:40   No results for input(s): "WBC", "HGB", "HCT", "PLT" in the last 72 hours.  Recent Labs    03/06/22 0552 03/07/22 1229  NA 137 135  K 3.4* 3.7  CL 98 97*  CO2 30 25  GLUCOSE 132* 126*  BUN 10 9  CREATININE 0.87 1.08*  CALCIUM 9.0 9.2    Intake/Output Summary (Last 24 hours) at 03/08/2022 1433 Last data filed at 03/08/2022 1303 Gross per 24 hour  Intake 700 ml  Output 3100 ml  Net -2400 ml        Physical Exam: Vital Signs Blood pressure 120/84, pulse 82, temperature 98 F (36.7 C), resp. rate 16, height 5\' 5"  (1.651 m), weight (!) 197.5 kg, SpO2 98 %.  General: awake, alert, appropriate,BMI 72; NAD- sitting EOB; is able to stand sit-stand with supervision HENT: conjugate gaze; oropharynx moist CV: regular  rate; no JVD Pulmonary: CTA B/L; no W/R/R- good air movement GI: soft, NT, ND, (+)BS Psychiatric: appropriate Neurological: Ox3 MS: L knee appears more swollen- more like effusion medially on L knee- TTP around Medial aspect of L knee Neuro:  Patient is alert and awake.  Makes eye contact with examiner and follows commands.  Provides name and age.  Fair insight and awareness. FNT intact.  Strength 5/5 in b/l UE and LLE Strength  4-/5 proximal RLE to 4+/5 distal RLE Altered sensation L thigh  Musculoskeletal: Tenderness at b/l hips, no joint swelling noted     Assessment/Plan: 1. Functional deficits which require 3+ hours per day of interdisciplinary therapy in a comprehensive inpatient rehab setting. Physiatrist is providing close team supervision and 24 hour management of active medical problems listed below. Physiatrist and rehab team continue to assess barriers to discharge/monitor patient progress toward functional and medical goals  Care Tool:  Bathing    Body parts bathed by patient: Right arm, Left arm, Chest, Face, Abdomen, Left upper leg, Right upper leg, Right lower leg, Left lower leg   Body parts bathed by helper: Front perineal area, Buttocks     Bathing assist Assist Level: Minimal Assistance - Patient > 75%     Upper Body Dressing/Undressing Upper  body dressing   What is the patient wearing?: Pull over shirt    Upper body assist Assist Level: Set up assist    Lower Body Dressing/Undressing Lower body dressing      What is the patient wearing?: Pants, Underwear/pull up     Lower body assist Assist for lower body dressing: Set up assist     Toileting Toileting    Toileting assist Assist for toileting: Set up assist     Transfers Chair/bed transfer  Transfers assist     Chair/bed transfer assist level: Contact Guard/Touching assist     Locomotion Ambulation   Ambulation assist      Assist level: Minimal Assistance - Patient >  75% Assistive device: Walker-rolling Max distance: 35   Walk 10 feet activity   Assist     Assist level: Contact Guard/Touching assist Assistive device: Walker-rolling   Walk 50 feet activity   Assist Walk 50 feet with 2 turns activity did not occur: Safety/medical concerns (fatigue)         Walk 150 feet activity   Assist Walk 150 feet activity did not occur: Safety/medical concerns (fatigue)         Walk 10 feet on uneven surface  activity   Assist Walk 10 feet on uneven surfaces activity did not occur: Safety/medical concerns (fatigue)         Wheelchair     Assist Is the patient using a wheelchair?: Yes Type of Wheelchair: Manual    Wheelchair assist level: Minimal Assistance - Patient > 75% Max wheelchair distance: 10    Wheelchair 50 feet with 2 turns activity    Assist        Assist Level: Total Assistance - Patient < 25%   Wheelchair 150 feet activity     Assist      Assist Level: Total Assistance - Patient < 25%   Blood pressure 120/84, pulse 82, temperature 98 F (36.7 C), resp. rate 16, height 5\' 5"  (1.651 m), weight (!) 197.5 kg, SpO2 98 %.  Medical Problem List and Plan: 1. Functional deficits secondary to debility/morbid obesity/right lower extremity cellulitis             -patient may  shower             -ELOS/Goals: 1/2 Mod I/supervision             -Continue CIR  -Hospital bed has been ordered   -Reports she did well with steps yesterday!  12/30- asked ortho to see pt for possible L knee tap for fluid- they asked for L knee xray which was done- shows severe L knee OA  Con't CIR- PT and OT 2.  Antithrombotics: -DVT/anticoagulation:  Pharmaceutical: Eliquis             -antiplatelet therapy: N/A 3. Lower extremity pain: Lyrica 100 mg twice daily, oxycodone as needed- increased to q4H prn  12/30- pain tolerable- con't regimen 4. Mood/Behavior/Sleep: BuSpar 10 mg twice daily             -antipsychotic agents:  Provide emotional support             -Hx of bipolar 1 disorder             -DC atarax as she reports this caused Qtc/cardiac issues in past 5. Neuropsych/cognition: This patient is capable of making decisions on her own behalf. 6. Skin/Wound Care: Routine skin checks 7. Fluids/Electrolytes/Nutrition: Routine in and outs with follow-up chemistries 8.  Right  leg cellulitis.  Completing course 5 days Ancef. 9.  Chronic combined CHF.  Bumex 2 mg daily, Aldactone 25 mg daily.,  Cozaar 25 mg daily. monitor for any fluid overload.  -No signs of fluid overload at this time  -Restart bumex  -COZAAR discontinued  -Will ask nursing to recheck weight Filed Weights   03/03/22 0934 03/07/22 1401  Weight: (!) 198.9 kg (!) 197.5 kg    10.  Atrial fibrillation/prolonged QT  Continue Eliquis 5 mg twice daily, Tikosyn 500 mg twice daily, Inderal 80 mg 3 times daily 11.  Diabetes mellitus.  Hemoglobin A1c 6.1.  SSI.  Consider restart Glucotrol 10 mg twice daily. Prior use of Ozempic/Dupixent? Resume as needed needed  -12/25 CBGs well controlled, continue to monitor  -12/26 Called pharmacy regarding Ozempic, family to bring medicine in from home for tomorrow/Thursday CBG (last 3)   -12/27 well controlled, she asked about restarting glucotrol, will hold off on this as CBGs well controlled and would not want to increase risk of hypoglycemia  -12/28 had ozembic yesterday  12/30- CBGs controlled- con't regimen Recent Labs    03/07/22 2135 03/08/22 0519 03/08/22 1128  GLUCAP 129* 143* 99   12.  Morbid obesity.  BMI 86.80.  Dietary follow-up.  History of gastric sleeve 2016 and nonadherence to diet.  12/30- BMI 72.46 now 13.  GERD.  Protonix 14.  Iron deficiency anemia  -12/25 stable at HGB 10.5 15. Constipation: add slow mag. Resolved.  16. R chronic thigh pain. Reported to have hx of lumbar radiculopathy. Pain in this area could also be lateral femoral cutaneous nerve irritation. Continue lyrica. 17.  Hypotension/AKI: d/c Losartan, discuss with patient's cardiologist whether spironolactone can be decreased Monitor response to medication change  -12/28 Restart bumex, Cr down to 0.87/bun 10-stable  -12/29 will decrease propranolol from 80mg  to 60mg  TID, hold if SBP <100  12/30- Bumex restarted to 2 mg since labs OK    03/08/2022    1:01 PM 03/08/2022    8:21 AM 03/08/2022    3:21 AM  Vitals with BMI  Systolic [id number]   [id number]  Diastolic 84 64   64 61  Pulse 82 73   73 72    18. Hypokalemia:   -K+ stable at 3.8 after 73meq yesterday, start 40meq daily supplement  -12/18 K+3.4 will give 7meq today, increase daily supplement to 106meq  -Recheck BMP   12/30- K+ 3.7 19. Nausea:   -Improved, continue scopolamine patch for now 20. Eczema   -Dupixent-due today 12/28 21. L knee swelling/pain  12/30- has severe OA of L knee- asked Ortho PA to see if injection would be possible  I spent a total of 38   minutes on total care today- >50% coordination of care- due to  d/w Ortho PA as well as nursing about Bumex 2 mg  LOS: 8 days A FACE TO FACE EVALUATION WAS PERFORMED  Joshue Badal 03/08/2022, 2:33 PM

## 2022-03-09 LAB — GLUCOSE, CAPILLARY
Glucose-Capillary: 179 mg/dL — ABNORMAL HIGH (ref 70–99)
Glucose-Capillary: 170 mg/dL — ABNORMAL HIGH (ref 70–99)
Glucose-Capillary: 213 mg/dL — ABNORMAL HIGH (ref 70–99)
Glucose-Capillary: 153 mg/dL — ABNORMAL HIGH (ref 70–99)

## 2022-03-09 NOTE — Plan of Care (Signed)
  Problem: Consults Goal: RH GENERAL PATIENT EDUCATION Description: See Patient Education module for education specifics. Outcome: Progressing   Problem: RH BOWEL ELIMINATION Goal: RH STG MANAGE BOWEL WITH ASSISTANCE Description: STG Manage Bowel with mod I  Assistance. Outcome: Progressing Goal: RH STG MANAGE BOWEL W/MEDICATION W/ASSISTANCE Description: STG Manage Bowel with Medication with mod I Assistance. Outcome: Progressing   Problem: RH BLADDER ELIMINATION Goal: RH STG MANAGE BLADDER WITH ASSISTANCE Description: STG Manage Bladder With mod I  Assistance Outcome: Progressing Goal: RH STG MANAGE BLADDER WITH MEDICATION WITH ASSISTANCE Description: STG Manage Bladder With Medication With mod I  Assistance. Outcome: Progressing   Problem: RH SKIN INTEGRITY Goal: RH STG SKIN FREE OF INFECTION/BREAKDOWN Description: W min assist Outcome: Progressing   Problem: RH SAFETY Goal: RH STG ADHERE TO SAFETY PRECAUTIONS W/ASSISTANCE/DEVICE Description: STG Adhere to Safety Precautions With cues Assistance/Device. Outcome: Progressing   Problem: RH PAIN MANAGEMENT Goal: RH STG PAIN MANAGED AT OR BELOW PT'S PAIN GOAL Description: < 4 with prns Outcome: Progressing   Problem: RH KNOWLEDGE DEFICIT GENERAL Goal: RH STG INCREASE KNOWLEDGE OF SELF CARE AFTER HOSPITALIZATION Description: Patient will be able to manage care at discharge using educational handouts independently Outcome: Progressing   

## 2022-03-09 NOTE — Progress Notes (Signed)
PROGRESS NOTE   Subjective/Complaints:  L knee pain a little better s/p L knee injection.   Thigh hurting- "must have done too much".    ROS:   Pt denies SOB, abd pain, CP, N/V/C/D, and vision changes  Except per HPI   Objective:   DG Knee 1-2 Views Left  Result Date: 03/08/2022 CLINICAL DATA:  Pain and swelling of left knee. EXAM: LEFT KNEE - 1-2 VIEW COMPARISON:  None Available. FINDINGS: There is diffuse decreased bone mineralization. Severe lateral and moderate medial compartment joint space narrowing and peripheral osteophytosis. Moderate-to-large superior and inferior patellar degenerative osteophytes. Large posterior femoral condyle peripheral degenerative osteophytes. Moderate superior trochlear degenerative osteophytes. No definite joint effusion. No acute fracture is seen. No dislocation. 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 Electronically Signed   By: Yvonne Kendall M.D.   On: 03/08/2022 12:40   No results for input(s): "WBC", "HGB", "HCT", "PLT" in the last 72 hours.  Recent Labs    03/07/22 1229  NA 135  K 3.7  CL 97*  CO2 25  GLUCOSE 126*  BUN 9  CREATININE 1.08*  CALCIUM 9.2    Intake/Output Summary (Last 24 hours) at 03/09/2022 1102 Last data filed at 03/09/2022 [id number] Gross per 24 hour  Intake 480 ml  Output 1525 ml  Net -1045 ml        Physical Exam: Vital Signs Blood pressure (!) 141/67, pulse 78, temperature 97.8 F (36.6 C), temperature source Oral, resp. rate 16, height 5\' 5"  (1.651 m), weight (!) 198 kg, SpO2 98 %.   General: awake, alert, appropriate, sitting EOB; BMI 72; NAD HENT: conjugate gaze; oropharynx moist CV: regular rate; no JVD Pulmonary: CTA B/L; no W/R/R- good air movement GI: soft, NT, ND, (+)BS Psychiatric: appropriate Neurological: Ox3  MS: L knee looks less swollen; less TTP Neuro:  Patient is alert and  awake.  Makes eye contact with examiner and follows commands.  Provides name and age.  Fair insight and awareness. FNT intact.  Strength 5/5 in b/l UE and LLE Strength  4-/5 proximal RLE to 4+/5 distal RLE Altered sensation L thigh  Musculoskeletal: Tenderness at b/l hips, no joint swelling noted     Assessment/Plan: 1. Functional deficits which require 3+ hours per day of interdisciplinary therapy in a comprehensive inpatient rehab setting. Physiatrist is providing close team supervision and 24 hour management of active medical problems listed below. Physiatrist and rehab team continue to assess barriers to discharge/monitor patient progress toward functional and medical goals  Care Tool:  Bathing    Body parts bathed by patient: Right arm, Left arm, Chest, Face, Abdomen, Left upper leg, Right upper leg, Right lower leg, Left lower leg   Body parts bathed by helper: Front perineal area, Buttocks     Bathing assist Assist Level: Minimal Assistance - Patient > 75%     Upper Body Dressing/Undressing Upper body dressing   What is the patient wearing?: Pull over shirt    Upper body assist Assist Level: Set up assist    Lower Body Dressing/Undressing Lower body dressing      What is the patient wearing?: Pants, Underwear/pull up  Lower body assist Assist for lower body dressing: Set up assist     Toileting Toileting    Toileting assist Assist for toileting: Set up assist     Transfers Chair/bed transfer  Transfers assist     Chair/bed transfer assist level: Contact Guard/Touching assist     Locomotion Ambulation   Ambulation assist      Assist level: Minimal Assistance - Patient > 75% Assistive device: Walker-rolling Max distance: 35   Walk 10 feet activity   Assist     Assist level: Contact Guard/Touching assist Assistive device: Walker-rolling   Walk 50 feet activity   Assist Walk 50 feet with 2 turns activity did not occur:  Safety/medical concerns (fatigue)         Walk 150 feet activity   Assist Walk 150 feet activity did not occur: Safety/medical concerns (fatigue)         Walk 10 feet on uneven surface  activity   Assist Walk 10 feet on uneven surfaces activity did not occur: Safety/medical concerns (fatigue)         Wheelchair     Assist Is the patient using a wheelchair?: Yes Type of Wheelchair: Manual    Wheelchair assist level: Minimal Assistance - Patient > 75% Max wheelchair distance: 10    Wheelchair 50 feet with 2 turns activity    Assist        Assist Level: Total Assistance - Patient < 25%   Wheelchair 150 feet activity     Assist      Assist Level: Total Assistance - Patient < 25%   Blood pressure (!) 141/67, pulse 78, temperature 97.8 F (36.6 C), temperature source Oral, resp. rate 16, height 5\' 5"  (1.651 m), weight (!) 198 kg, SpO2 98 %.  Medical Problem List and Plan: 1. Functional deficits secondary to debility/morbid obesity/right lower extremity cellulitis             -patient may  shower             -ELOS/Goals: 1/2 Mod I/supervision             -Continue CIR  -Hospital bed has been ordered   -Reports she did well with steps yesterday!  12/30- asked ortho to see pt for possible L knee tap for fluid- they asked for L knee xray which was done- shows severe L knee OA  Con't CIR- PT and OT- knee pain doing better 2.  Antithrombotics: -DVT/anticoagulation:  Pharmaceutical: Eliquis             -antiplatelet therapy: N/A 3. Lower extremity pain: Lyrica 100 mg twice daily, oxycodone as needed- increased to q4H prn  12/30-12/31- pain tolerable- con't regimen 4. Mood/Behavior/Sleep: BuSpar 10 mg twice daily             -antipsychotic agents: Provide emotional support             -Hx of bipolar 1 disorder             -DC atarax as she reports this caused Qtc/cardiac issues in past 5. Neuropsych/cognition: This patient is capable of making  decisions on her own behalf. 6. Skin/Wound Care: Routine skin checks 7. Fluids/Electrolytes/Nutrition: Routine in and outs with follow-up chemistries 8.  Right leg cellulitis.  Completing course 5 days Ancef. 9.  Chronic combined CHF.  Bumex 2 mg daily, Aldactone 25 mg daily.,  Cozaar 25 mg daily. monitor for any fluid overload.  -No signs of fluid overload at this time  -  Restart bumex  -COZAAR discontinued  -Will ask nursing to recheck weight Filed Weights   03/03/22 0934 03/07/22 1401 03/09/22 0605  Weight: (!) 198.9 kg (!) 197.5 kg (!) 198 kg    10.  Atrial fibrillation/prolonged QT  Continue Eliquis 5 mg twice daily, Tikosyn 500 mg twice daily, Inderal 80 mg 3 times daily 11.  Diabetes mellitus.  Hemoglobin A1c 6.1.  SSI.  Consider restart Glucotrol 10 mg twice daily. Prior use of Ozempic/Dupixent? Resume as needed needed  -12/25 CBGs well controlled, continue to monitor  -12/26 Called pharmacy regarding Ozempic, family to bring medicine in from home for tomorrow/Thursday CBG (last 3)   -12/27 well controlled, she asked about restarting glucotrol, will hold off on this as CBGs well controlled and would not want to increase risk of hypoglycemia  -12/28 had ozembic yesterday  12/30-12/31- CBGs usually controlled- con't regimen- and monitor trend for Bg's >200 x1 in last 24 hours Recent Labs    03/08/22 1618 03/08/22 2143 03/09/22 0418  GLUCAP 113* 226* 179*   12.  Morbid obesity.  BMI 86.80.  Dietary follow-up.  History of gastric sleeve 2016 and nonadherence to diet.  12/30- BMI 72.46 now 13.  GERD.  Protonix 14.  Iron deficiency anemia  -12/25 stable at HGB 10.5 15. Constipation: add slow mag. Resolved.  16. R chronic thigh pain. Reported to have hx of lumbar radiculopathy. Pain in this area could also be lateral femoral cutaneous nerve irritation. Continue lyrica. 17. Hypotension/AKI: d/c Losartan, discuss with patient's cardiologist whether spironolactone can be  decreased Monitor response to medication change  -12/28 Restart bumex, Cr down to 0.87/bun 10-stable  -12/29 will decrease propranolol from 80mg  to 60mg  TID, hold if SBP <100  12/30- Bumex restarted to 2 mg since labs OK    03/09/2022    9:50 AM 03/09/2022    9:02 AM 03/09/2022    6:05 AM  Vitals with BMI  Weight   436 lbs 8 oz  BMI   [id number]  Systolic [id number]    Diastolic 67    Pulse 78 80     18. Hypokalemia:   -K+ stable at 3.8 after 33meq yesterday, start 14meq daily supplement  -12/18 K+3.4 will give 32meq today, increase daily supplement to 20meq  -Recheck BMP   12/30- K+ 3.7 19. Nausea:   -Improved, continue scopolamine patch for now 20. Eczema   -Dupixent-due today 12/28 21. L knee swelling/pain  12/30- has severe OA of L knee- asked Ortho PA to see if injection would be possible  12/31- no fluid removed, but got steroid injection- pt asking about gel injections- we went over these as well as celestone- long acting steroid- since has severe DJD, unlikely regular steroid injection to last more than 6 weeks- educated pt to keep track of this- to see Jessie/ortho outpt.   I spent a total of 37   minutes on total care today- >50% coordination of care- due to  Education of pt about choices for L knee pain/severe DJD.   LOS: 9 days A FACE TO FACE EVALUATION WAS PERFORMED  Arben Packman 03/09/2022, 11:02 AM

## 2022-03-09 NOTE — Consult Note (Signed)
Neuropsychological Consultation   Patient:   Alyssa Blanchard   DOB:   09/06/1970  MR Number:  [id number]  Location:  [place] [place] [facility] [id number] [place] [address] Dept: [place]: [phone]           Date of Service:   03/07/2022  Start Time:   10 AM End Time:   11 AM  Provider/Observer:  Ilean Skill, Psy.D.       Clinical Neuropsychologist       Billing Code/Service: [phone]  Reason for Service:    Alyssa Blanchard is a 51 year old right-handed female referred for neuropsychological consultation due to coping and adjustment issues during her extended hospital stay and currently being treated on the comprehensive inpatient rehabilitation unit.  Patient has a past diagnosis of bipolar disorder but patient currently taking no psychotropic medications beyond BuSpar.  This has been continued on the unit.  Patient reports that her mood has been quite stable for some time but she does acknowledge past episodes of manic and depressive events.  The patient is not currently seeing a psychiatrist.  Patient has other past medical history including chronic anemia, lumbar radiculopathy, fibromyalgia and morbid obesity.  Patient had gastric sleeve procedure in 2016 but was not adhering to dietary requirements and continued with her prior eating habits.  Patient is also dealt with A-fib and chronic combined congestive heart failure.  Patient presented to [facility] on 02/16/2022 with increasing swelling of lower extremities and recent fall with complaints of tenderness to right hip.  EKG at that time showed A-fib with heart rate of 82.  Patient also found to have cellulitis right proximal lower extremity and started on antibiotics.  Patient with functional limitations due to pain/radiculopathy and numbness in her right thigh.  There was also bilateral hip soreness noted.  Patient admitted to CIR  after therapy evaluations due to patient's debility and decreased functional mobility.   Below is the HPI for the current admission for convenience:  HPI: Alyssa Blanchard is a 51 year old right-handed female with history of  chronic anemia, lumbar radiculopathy, fibromyalgia, morbid obesity BMI 86.80 with gastric sleeve 2016 and nonadherence to diet, atrial fibrillation maintained on Eliquis as well as Tikosyn with limited cardiology follow-up Dr. 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 She reports she has not used any psychiatric medications in a about 2 years.  Patient recently admitted in October for recurrent episodes of congestive heart failure discharged to skilled nursing facility/Alyssa Blanchard for 4 weeks and then discharged to home.  Per chart review patient lives with her mother.  1 level home with 4 steps to entry.  Mother is limited physically.  Patient sleeps in a recliner and mobilizes with a rolling walker.  Presented to [facility] long hospital 02/16/2022 with increasing swelling of lower extremities and recent fall with complaints of tenderness of the right hip and erythema posterior right leg and knee.  Admission chemistries BNP 120.5, sodium 134, glucose 138, hemoglobin 8.7, WBC 13,000, lactic acid 2.0-2.4, urinalysis negative.  X-rays of right hip and leg showed no evidence of fracture or dislocation.  EKG showed A-fib with rate 82.  Placed on intravenous Ancef for cellulitis right proximal lower extremity and completed 5-day course.  She remains on chronic Lyrica for lumbar radiculopathy as well as oxycodone as needed.  She reports continued pain and numbness in R thigh.  She also has bilateral hip soreness she attributes to walking with therapy. MRI not completed  due to body habitus. She reports the cellulitis redness has resolved. She would like it noted that she see her cardiologist Dr. Carlis Stable regularly. Therapy evaluations completed due to patient's debility decreased functional mobility was admitted for a comprehensive rehab program.   Current Status:  Patient was awake and alert sitting up on the edge of her bed as I entered the room.  We had a long discussion regarding her prior psychiatric history and reports that she essentially stopped taking any psychotropic medications nearly a year ago as she attributed some of her symptoms and difficulties with weight to her medications.  Patient also admits to her noncompliance with dietary expectations following gastric bypass surgery.  Patient reports that it was very stressful during her recent skilled nursing facility placement for debility and she is quite pleased with the extent of rehab services being provided to her now and reports that her motivation and expectation of possible improvement has increased dramatically.  The patient reports that she is feeling stronger than she has in some time.  Patient reports motivation to continue with functional improvement efforts post discharge.  Patient also reports that she would prefer to start back with psychotherapeutic interventions and therapy regarding past difficulties and traumatic experiences as well as restarting follow-up with psychiatry.  Patient reports that her current mood state is stable and denies any symptoms associated with manic episodes or depressive episodes.  Patient also appeared stable from an emotional standpoint during the hour I spent with with her in face-to-face communication.  Behavioral Observation: Alyssa Blanchard  presents as a 51 y.o.-year-old Right handed Caucasian Female who appeared her stated age. her dress was Appropriate and she was Well Groomed and her manners were Appropriate to the situation.  her participation was indicative  of Appropriate and Redirectable behaviors.  There were physical disabilities noted.  she displayed an appropriate level of cooperation and motivation.    Interactions:    Active Appropriate  Attention:   abnormal and attention span appeared shorter than expected for age  Memory:   within normal limits; recent and remote memory intact  Visuo-spatial:  not examined  Speech (Volume):  normal  Speech:   normal; normal  Thought Process:  Coherent and Relevant  Though Content:  WNL; not suicidal and not homicidal  Orientation:   person, place, time/date, and situation  Judgment:   Fair  Planning:   Fair  Affect:    Appropriate  Mood:    Euthymic  Insight:   Good  Intelligence:   normal  Medical History:   Past Medical History:  Diagnosis Date   Anxiety    Asthma    Atrial fibrillation ([place])    Depression    DM (diabetes mellitus), type 2 ([place]) 02/16/2022   Dysrhythmia    new onset Afib rvr   GERD (gastroesophageal reflux disease)    Heart failure ([place])    Heel spur    bilat   History of bronchitis    History of chicken pox    History of urinary tract infection    Migraines    Plantar fasciitis    bilat   STD (sexually transmitted disease)    chl hx & hsv 1&2   Tremors of nervous system  Urinary incontinence          Patient Active Problem List   Diagnosis Date Noted   Debility 02/28/2022   Prolonged QT interval 02/17/2022   Cellulitis of right leg 02/16/2022   Lumbar radiculopathy, right 02/16/2022   Anemia 02/16/2022   Iron deficiency anemia 02/02/2022   Type 2 diabetes mellitus without complication, without long-term current use of insulin ([place]) 12/25/2021   Bipolar 1 disorder ([place]) 01/30/2020   Chronic combined systolic and diastolic congestive heart failure ([place]) 09/25/2019   Acute pulmonary edema ([facility])    Paroxysmal atrial fibrillation ([place]) 08/02/2019   Bipolar disorder, in full remission, most recent episode manic ([place]) 04/15/2019    Psychosis ([place]) 04/14/2019   Not well controlled moderate persistent asthma 02/02/2018   Chronic rhinitis 02/02/2018   Status asthmaticus 11/29/2017   Acute respiratory failure with hypoxia ([place]) 11/29/2017   Gastroesophageal reflux disease 09/01/2017   Cough 12/31/2016   Dysuria 07/28/2016   Clinical infection 07/28/2016   History of asthma 04/17/2016   Acute bronchitis 04/17/2016   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   Migraine without aura and without status migrainosus, not intractable 10/10/2014   Migraine with aura and without status migrainosus, not intractable 08/29/2014   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 classified elsewhere 08/29/2014   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  Patient had been followed by psychiatry until roughly 1 year ago.  Patient has been taking no other psychotropic medications other than BuSpar.  Patient does take Lyrica primarily for her pain symptoms.  Family Med/Psych History:  Family History  Problem Relation Age of Onset   Diabetes Mother    Hypertension Mother    Hyperlipidemia Mother    Kidney disease Mother        CKD stage 4   Cancer Father        pancreatic   Hypertension Maternal Grandmother    Diabetes Maternal Grandmother    Heart failure Maternal Grandmother        CHF   Heart attack Maternal Grandfather    Hypertension Maternal Grandfather    Hypertension Paternal Grandmother    Alzheimer's disease Paternal Grandmother    Hypertension Paternal  Grandfather    Cancer Maternal Uncle        melanoma   Cancer Paternal Uncle        kidney   Impression/DX:  Alyssa Blanchard is a 51 year old right-handed female referred for neuropsychological consultation due to coping and adjustment issues during her extended hospital stay and currently being treated on the comprehensive inpatient rehabilitation unit.  Patient has a past diagnosis of bipolar disorder but patient currently taking no psychotropic medications beyond BuSpar.  This has been continued on the unit.  Patient reports that her mood has been quite stable for some time but she does acknowledge past episodes of manic and depressive events.  The patient is not currently seeing a psychiatrist.  Patient has other past medical history including chronic anemia, lumbar radiculopathy, fibromyalgia and morbid obesity.  Patient had gastric sleeve procedure in  2016 but was not adhering to dietary requirements and continued with her prior eating habits.  Patient is also dealt with A-fib and chronic combined congestive heart failure.  Patient presented to [facility] on 02/16/2022 with increasing swelling of lower extremities and recent fall with complaints of tenderness to right hip.  EKG at that time showed A-fib with heart rate of 82.  Patient also found to have cellulitis right proximal lower extremity and started on antibiotics.  Patient with functional limitations due to pain/radiculopathy and numbness in her right thigh.  There was also bilateral hip soreness noted.  Patient admitted to CIR after therapy evaluations due to patient's debility and decreased functional mobility.  Patient was awake and alert sitting up on the edge of her bed as I entered the room.  We had a long discussion regarding her prior psychiatric history and reports that she essentially stopped taking any psychotropic medications nearly a year ago as she attributed some of her symptoms and difficulties with weight to her  medications.  Patient also admits to her noncompliance with dietary expectations following gastric bypass surgery.  Patient reports that it was very stressful during her recent skilled nursing facility placement for debility and she is quite pleased with the extent of rehab services being provided to her now and reports that her motivation and expectation of possible improvement has increased dramatically.  The patient reports that she is feeling stronger than she has in some time.  Patient reports motivation to continue with functional improvement efforts post discharge.  Patient also reports that she would prefer to start back with psychotherapeutic interventions and therapy regarding past difficulties and traumatic experiences as well as restarting follow-up with psychiatry.  Patient reports that her current mood state is stable and denies any symptoms associated with manic episodes or depressive episodes.  Patient also appeared stable from an emotional standpoint during the hour I spent with with her in face-to-face communication.  Disposition/Plan:  Today we worked on coping and adjustment issues and plan for current rehabilitative efforts with plan for discharge in approximately 4 days.  Patient is being set up for outpatient PT/OT.  A consultation was also placed to the [facility] outpatient program for both psychiatry and therapeutic interventions.  Diagnosis:    Debility - Plan: Ambulatory referral to Behavioral Health         Electronically Signed   _______________________ Arley Phenix, Psy.D. Clinical Neuropsychologist

## 2022-03-10 LAB — GLUCOSE, CAPILLARY
Glucose-Capillary: 129 mg/dL — ABNORMAL HIGH (ref 70–99)
Glucose-Capillary: 108 mg/dL — ABNORMAL HIGH (ref 70–99)
Glucose-Capillary: 139 mg/dL — ABNORMAL HIGH (ref 70–99)
Glucose-Capillary: 149 mg/dL — ABNORMAL HIGH (ref 70–99)

## 2022-03-10 MED ORDER — HYDROCORTISONE 1 % EX CREA
TOPICAL_CREAM | Freq: Two times a day (BID) | CUTANEOUS | Status: DC
  Administered 2022-03-11: 1 via TOPICAL
  Filled 2022-03-10: qty 28

## 2022-03-10 MED ORDER — BUMETANIDE 1 MG PO TABS
1.0000 mg | ORAL_TABLET | Freq: Every day | ORAL | Status: DC
  Administered 2022-03-11: 1 mg via ORAL
  Filled 2022-03-10: qty 1

## 2022-03-10 NOTE — Progress Notes (Signed)
Occupational Therapy Discharge Summary  Patient Details  Name: Alyssa Blanchard MRN: [id number] Date of Birth: 1970-04-11  Date of Discharge from OT service:March 10, 2022  Today's Date: 03/10/2022 OT Individual Time: 0762-2633 OT Individual Time Calculation (min): 71 min    Patient has met 9 of 9 long term goals due to improved activity tolerance, improved balance, postural control, and ability to compensate for deficits.  Patient to discharge at overall mod I level with ADLs and Supervision level with IADLs  Patient's care partner (mom)  is independent to provide the necessary physical assistance at discharge- including occasional assist with thorough toileting needs. Alyssa Blanchard has made excellent progress in CIR progressing to a mod I- (S) level overall. Her mother did not attend family education as pt feels as though she can relay all information to her mother as needed.    Recommendation:  Patient will benefit from ongoing skilled OT services in outpatient setting to continue to advance functional skills in the area of BADL and iADL.  Equipment: Bariatric BSC, RW  Reasons for discharge: treatment goals met and discharge from hospital  Patient/family agrees with progress made and goals achieved: Yes  Skilled OT Intervention Pt received sitting EOB with no c/o pain, agreeable to OT session. Pt completed 80 ft of functional mobility before needing to sit d/t L knee pain. Pt completed Wii bowling game standing for each frame, often remaining standing for 2-3 frames at a time and self monitoring need for rest breaks. Pt able to remain standing without a AD, (S) for mobility to <> from mat without a device. She then completed 3x10 UE strengthening circuit with bicep curls, overhead press and scapular retractions/protractions for intended carryover to ADL/IADLs independence. She then completed 3x5 repetitions of blocked practice sit <> stands holding 3 lb dumbbells at her chest level to eliminate  UE reliance during stand. Completed to challenge BLE strength and functional activity tolerance. She returned to her room and was left sitting EOB with all needs met.    OT Discharge Precautions/Restrictions  Precautions Precautions: Fall Restrictions Weight Bearing Restrictions: No   Vital Signs Therapy Vitals Temp: 97.6 F (36.4 C) Pulse Rate: 89 Resp: 18 BP: 129/60 Patient Position (if appropriate): Lying Oxygen Therapy SpO2: 100 % O2 Device: Room Air Pain Pain Assessment Pain Scale: 0-10 Pain Score: 8  Pain Type: Acute pain Pain Location: Leg Pain Orientation: Right Pain Radiating Towards: thigh Pain Descriptors / Indicators: Aching Pain Frequency: Occasional Pain Onset: Gradual Pain Intervention(s): Medication (See eMAR) ADL ADL Eating: Independent Where Assessed-Eating: Chair Grooming: Independent Where Assessed-Grooming: Sitting at sink Upper Body Bathing: Independent Where Assessed-Upper Body Bathing: Edge of bed Lower Body Bathing: Modified independent Where Assessed-Lower Body Bathing: Edge of bed Upper Body Dressing: Supervision/safety Where Assessed-Upper Body Dressing: Edge of bed Lower Body Dressing: Independent Where Assessed-Lower Body Dressing: Edge of bed Toileting: Modified independent Where Assessed-Toileting: Medical laboratory scientific officer: Modified independent Armed forces technical officer Method: Counselling psychologist: Engineer, technical sales Transfer: Distant supervision Tub/Shower Transfer Method: Wellsite geologist Transfer: Curator Method: Heritage manager: Civil engineer, contracting with back Vision Baseline Vision/History: 0 No visual deficits Patient Visual Report: No change from baseline Vision Assessment?: No apparent visual deficits Perception  Perception: Within Functional Limits Praxis Praxis: Intact Cognition Cognition Overall Cognitive Status: Within Functional Limits for  tasks assessed Arousal/Alertness: Awake/alert Orientation Level: Person;Place;Situation Person: Oriented Place: Oriented Situation: Oriented Memory: Appears intact Awareness: Appears intact Problem Solving: Appears intact Safety/Judgment: Appears intact Brief  Interview for Mental Status (BIMS) Repetition of Three Words (First Attempt): 3 Temporal Orientation: Year: Correct Temporal Orientation: Month: Accurate within 5 days Temporal Orientation: Day: Correct Recall: "Sock": Yes, no cue required Recall: "Blue": Yes, no cue required Recall: "Bed": Yes, no cue required BIMS Summary Score: 15 Sensation Sensation Light Touch: Impaired by gross assessment Central sensation comments: some numbness in R thigh Hot/Cold: Appears Intact Proprioception: Appears Intact Stereognosis: Appears Intact Coordination Gross Motor Movements are Fluid and Coordinated: Yes Fine Motor Movements are Fluid and Coordinated: Yes Motor  Motor Motor: Within Functional Limits Mobility  Bed Mobility Bed Mobility: Supine to Sit;Sit to Supine Supine to Sit: Independent with assistive device Sit to Supine: Independent with assistive device Transfers Sit to Stand: Independent with assistive device  Trunk/Postural Assessment  Cervical Assessment Cervical Assessment: Within Functional Limits Thoracic Assessment Thoracic Assessment: Within Functional Limits Lumbar Assessment Lumbar Assessment: Within Functional Limits Postural Control Postural Control: Deficits on evaluation Righting Reactions: decreased  Balance Balance Balance Assessed: Yes Static Sitting Balance Static Sitting - Balance Support: No upper extremity supported Static Sitting - Level of Assistance: 7: Independent Dynamic Sitting Balance Dynamic Sitting - Balance Support: During functional activity Dynamic Sitting - Level of Assistance: 7: Independent Static Standing Balance Static Standing - Balance Support: Bilateral upper  extremity supported Static Standing - Level of Assistance: 6: Modified independent (Device/Increase time) Dynamic Standing Balance Dynamic Standing - Balance Support: Bilateral upper extremity supported Dynamic Standing - Level of Assistance: 6: Modified independent (Device/Increase time) Extremity/Trunk Assessment RUE Assessment RUE Assessment: Within Functional Limits LUE Assessment LUE Assessment: Within Functional Limits   Curtis Sites 03/10/2022, 9:09 AM

## 2022-03-10 NOTE — Progress Notes (Signed)
Physical Therapy Discharge Summary  Patient Details  Name: Alyssa Blanchard MRN: [id number] Date of Birth: 1970-07-13  Date of Discharge from PT service:March 10, 2022  Today's Date: 03/10/2022 PT Individual Time: 1110-1205 PT Individual Time Calculation (min): 55 min    Patient has met 5 of 5 long term goals due to improved activity tolerance, improved balance, increased strength, decreased pain, and ability to compensate for deficits.  Patient to discharge at an ambulatory level Modified Independent.   Patient's care partner unavailable to provide any assistance at discharge, however patient will not require any physical assistance or supervision with functional mobility.   Reasons goals not met: N/A  Recommendation:  Patient will benefit from ongoing skilled PT services in outpatient setting to continue to advance safe functional mobility, address ongoing impairments in dynamic stability, gross strength, endurance/activity tolerance, and minimize fall risk.  Equipment: HDRW  Reasons for discharge: treatment goals met and discharge from hospital  Patient/family agrees with progress made and goals achieved: Yes  PT Discharge Precautions/Restrictions Precautions Precautions: Fall Restrictions Weight Bearing Restrictions: No Pain Interference Pain Interference Pain Effect on Sleep: 2. Occasionally Pain Interference with Therapy Activities: 1. Rarely or not at all Pain Interference with Day-to-Day Activities: 1. Rarely or not at all Vision/Perception  Vision - History Ability to See in Adequate Light: 1 Impaired Perception Perception: Within Functional Limits Praxis Praxis: Intact  Cognition Overall Cognitive Status: Within Functional Limits for tasks assessed Arousal/Alertness: Awake/alert Orientation Level: Oriented X4 Year: 2024 Month: January Day of Week: Correct Memory: Appears intact Awareness: Appears intact Problem Solving: Appears intact Safety/Judgment:  Appears intact Sensation Sensation Light Touch: Impaired by gross assessment Central sensation comments: some numbness in R thigh Hot/Cold: Appears Intact Proprioception: Appears Intact Stereognosis: Appears Intact Coordination Gross Motor Movements are Fluid and Coordinated: Yes Fine Motor Movements are Fluid and Coordinated: Yes Motor  Motor Motor: Within Functional Limits  Mobility Bed Mobility Bed Mobility: Rolling Right;Rolling Left;Supine to Sit;Sit to Supine Rolling Right: Independent with assistive device Rolling Left: Independent with assistive device Supine to Sit: Independent with assistive device Sit to Supine: Independent with assistive device Transfers Transfers: Sit to Stand;Stand to Sit;Stand Pivot Transfers Sit to Stand: Independent with assistive device Stand to Sit: Independent with assistive device Stand Pivot Transfers: Independent with assistive device Transfer (Assistive device): Rolling walker Locomotion  Gait Ambulation: Yes Gait Assistance: Independent with assistive device Gait Distance (Feet): 150 Feet Assistive device: Rolling walker Gait Gait: Yes Gait Pattern: Within Functional Limits Stairs / Additional Locomotion Stairs: Yes Stairs Assistance: Independent with assistive device Stair Management Technique: One rail Right Number of Stairs: 4 Height of Stairs: 6 Ramp: Independent with assistive device Curb: Independent with assistive device Wheelchair Mobility Wheelchair Mobility: No  Trunk/Postural Assessment  Cervical Assessment Cervical Assessment: Within Functional Limits Thoracic Assessment Thoracic Assessment: Within Functional Limits Lumbar Assessment Lumbar Assessment: Within Functional Limits Postural Control Postural Control: Deficits on evaluation Righting Reactions: Delayed  Balance Balance Balance Assessed: Yes Static Sitting Balance Static Sitting - Balance Support: No upper extremity supported Static Sitting -  Level of Assistance: 7: Independent Dynamic Sitting Balance Dynamic Sitting - Balance Support: During functional activity Dynamic Sitting - Level of Assistance: 7: Independent Static Standing Balance Static Standing - Balance Support: Bilateral upper extremity supported Static Standing - Level of Assistance: 6: Modified independent (Device/Increase time) Dynamic Standing Balance Dynamic Standing - Balance Support: Bilateral upper extremity supported;During functional activity Dynamic Standing - Level of Assistance: 6: Modified independent (Device/Increase time) Extremity Assessment  RUE Assessment RUE Assessment: Within Functional Limits LUE Assessment LUE Assessment: Within Functional Limits RLE Assessment General Strength Comments: Grossly 3+/5 and limited by osteoarthritis LLE Assessment LLE Assessment: Exceptions to [facility] General Strength Comments: Grossly 4/5, but limited by osteoarthritis pain  Skilled Intervention- Patient greeted sitting EOB and agreeable to PT treatment session. Patient performed sit/stand and stand pivot transfer with HDRW ModI. Patient ascended/descended x4 steps with S HR ModI- Patient performed a step-to pattern leading with L LE while ascending and descending. Patient then ambulated 150' with HDRW ModI- Patient only required one standing rest break throughout gait trial demonstrating significant improvements in overall endurance/activity tolerance. Patient ascended/descended a low grade ramp with HDRW ModI and was able to simulate transferring into/out of the car with HDRW ModI. Patient returned to her room where she toileted with HDRW and bedside commode ModI. Patient educated on HEP (please see below for details) and all questions answered throughout treatment session. Patient left sitting EOB in room with call bell within reach and all needs met.    Access Code: [email or web address] URL: [email or web address] Date: 03/10/2022 Prepared by: Jodi Mourning  Alyssa Blanchard  Exercises - Sit to Stand Without Arm Support  - Standing Hip Abduction with Counter Support - Heel Raises with Counter Support  - Standing March with [place] with Counter Support  - Side Stepping with Counter Support  - Lateral Step Up with [place] 03/10/2022, 11:36 AM

## 2022-03-10 NOTE — Progress Notes (Signed)
Occupational Therapy Session Note  Patient Details  Name: Alyssa Blanchard MRN: [id number] Date of Birth: 11-20-1970  Today's Date: 03/10/2022 OT Individual Time: 0845-1000 OT Individual Time Calculation (min): 75 min    Short Term Goals: Week 1:  OT Short Term Goal 1 (Week 1): pt will completed LB dressing min A using AD/AE PRN OT Short Term Goal 2 (Week 1): Pt will tolerate standing >2 minutes for fucntional activity OT Short Term Goal 3 (Week 1): Pt will verbalice 3 energy conservation techniques to utilize upon d/c home  Skilled Therapeutic Interventions/Progress Updates:    Pt received sitting EOB with no c/o pain, agreeable to OT session. She requested to freshen up EOB and then do full shower in this afternoon's session. She was able to wash up thoroughly with wipes with mod I EOB. Discussed home accessibility and d/c planning during b/d routine. She used AE as needed throughout, including LH sponge, reacher, and sock aid. Pt requires some assist to thoroughly clean peri areas d/t preference but is overall able to do it with mod I. Her mother is able to provide this assist as she requests. She was able to complete functional mobility to the sink without an AD with (S). With the RW she was mod I. She was able to don underwear and pants with mod I EOB. Patient required increased time for initiation, cuing, rest breaks, and for completion of tasks throughout session. Utilized therapeutic use of self throughout to promote efficiency. She donned socks with a sock aid with mod I. She completed 125 ft  functional mobility with the RW to address functional activity tolerance and dynamic standing balance. She required 2 standing rest breaks. She returned to the room and was left sitting up in the w/c with all needs met.   Therapy Documentation Precautions:  Precautions Precautions: Fall Restrictions Weight Bearing Restrictions: No   Therapy/Group: Individual Therapy  Curtis Sites 03/10/2022, 6:53 AM

## 2022-03-10 NOTE — Progress Notes (Signed)
PROGRESS NOTE   Subjective/Complaints: Asks what the cause of her right thigh pain is and what can be done to it. She has contraindication to MRI and did not tolerate Topamax in the past  ROS:   Pt denies SOB, abd pain, CP, N/V/C/D, and vision changes, +right thigh pain  Except per HPI   Objective:   No results found. No results for input(s): "WBC", "HGB", "HCT", "PLT" in the last 72 hours.  No results for input(s): "NA", "K", "CL", "CO2", "GLUCOSE", "BUN", "CREATININE", "CALCIUM" in the last 72 hours.   Intake/Output Summary (Last 24 hours) at 03/10/2022 1246 Last data filed at 03/10/2022 1231 Gross per 24 hour  Intake 600 ml  Output 4475 ml  Net -3875 ml        Physical Exam: Vital Signs Blood pressure 129/60, pulse 89, temperature 97.6 F (36.4 C), resp. rate 18, height 5\' 5"  (1.651 m), weight (!) 195.4 kg, SpO2 100 %. Gen: no distress, normal appearing HEENT: oral mucosa pink and moist, NCAT Cardio: Reg rate Chest: normal effort, normal rate of breathing Abd: soft, non-distended Ext: no edema Psych: pleasant, normal affect Skin: intact MS: L knee looks less swollen; less TTP Neuro:  Patient is alert and awake.  Makes eye contact with examiner and follows commands.  Provides name and age.  Fair insight and awareness. FNT intact.  Strength 5/5 in b/l UE and LLE Strength  4-/5 proximal RLE to 4+/5 distal RLE Altered sensation L thigh  Musculoskeletal: Tenderness at b/l hips, no joint swelling noted     Assessment/Plan: 1. Functional deficits which require 3+ hours per day of interdisciplinary therapy in a comprehensive inpatient rehab setting. Physiatrist is providing close team supervision and 24 hour management of active medical problems listed below. Physiatrist and rehab team continue to assess barriers to discharge/monitor patient progress toward functional and medical goals  Care Tool:  Bathing     Body parts bathed by patient: Right arm, Left arm, Chest, Face, Abdomen, Left upper leg, Right upper leg, Right lower leg, Left lower leg, Front perineal area, Buttocks   Body parts bathed by helper: Front perineal area, Buttocks     Bathing assist Assist Level: Supervision/Verbal cueing     Upper Body Dressing/Undressing Upper body dressing   What is the patient wearing?: Pull over shirt    Upper body assist Assist Level: Independent    Lower Body Dressing/Undressing Lower body dressing      What is the patient wearing?: Pants, Underwear/pull up     Lower body assist Assist for lower body dressing: Independent with assitive device Assistive Device Comment: reacher   Toileting Toileting    Toileting assist Assist for toileting: Independent with assistive device Assistive Device Comment: toileting aid   Transfers Chair/bed transfer  Transfers assist     Chair/bed transfer assist level: Independent with assistive device Chair/bed transfer assistive device: Programmer, multimedia   Ambulation assist      Assist level: Independent with assistive device Assistive device: Walker-rolling Max distance: 150'   Walk 10 feet activity   Assist     Assist level: Independent with assistive device Assistive device: Walker-rolling   Walk  50 feet activity   Assist Walk 50 feet with 2 turns activity did not occur: Safety/medical concerns (fatigue)  Assist level: Independent with assistive device Assistive device: Walker-rolling    Walk 150 feet activity   Assist Walk 150 feet activity did not occur: Safety/medical concerns (fatigue)  Assist level: Independent with assistive device Assistive device: Walker-rolling    Walk 10 feet on uneven surface  activity   Assist Walk 10 feet on uneven surfaces activity did not occur: Safety/medical concerns (fatigue)   Assist level: Independent with assistive device Assistive device: Walker-rolling    Wheelchair     Assist Is the patient using a wheelchair?: No Type of Wheelchair: Manual    Wheelchair assist level: Minimal Assistance - Patient > 75% Max wheelchair distance: 10    Wheelchair 50 feet with 2 turns activity    Assist        Assist Level: Total Assistance - Patient < 25%   Wheelchair 150 feet activity     Assist      Assist Level: Total Assistance - Patient < 25%   Blood pressure 129/60, pulse 89, temperature 97.6 F (36.4 C), resp. rate 18, height 5\' 5"  (1.651 m), weight (!) 195.4 kg, SpO2 100 %.  Medical Problem List and Plan: 1. Functional deficits secondary to debility/morbid obesity/right lower extremity cellulitis             -patient may  shower             -ELOS/Goals: 1/2 Mod I/supervision             -Continue [facility] bed has been ordered   -Reports she did well with steps yesterday!  12/30- asked ortho to see pt for possible L knee tap for fluid- they asked for L knee xray which was done- shows severe L knee OA  Con't CIR- PT and OT- knee pain doing better 2.  Antithrombotics: -DVT/anticoagulation:  Pharmaceutical: Eliquis             -antiplatelet therapy: N/A 3. Lower extremity pain: Lyrica 100 mg twice daily, oxycodone as needed- increased to q4H prn  12/30-12/31- pain tolerable- con't regimen 4. Mood/Behavior/Sleep: BuSpar 10 mg twice daily             -antipsychotic agents: Provide emotional support             -Hx of bipolar 1 disorder             -DC atarax as she reports this caused Qtc/cardiac issues in past 5. Neuropsych/cognition: This patient is capable of making decisions on her own behalf. 6. Skin/Wound Care: Routine skin checks 7. Fluids/Electrolytes/Nutrition: Routine in and outs with follow-up chemistries 8.  Right leg cellulitis.  Completing course 5 days Ancef. 9.  Chronic combined CHF.  Bumex 2 mg daily, Aldactone 25 mg daily.,  Cozaar 25 mg daily. monitor for any fluid overload.  -No signs of fluid  overload at this time  -Restart bumex  -COZAAR discontinued  -Will ask nursing to recheck weight  1/1: down nearly 3kg and patient asks if Bumex can be reduced, will reduce to 1mg  and reweigh tomorrow.  Filed Weights   03/07/22 1401 03/09/22 0605 03/10/22 1035  Weight: (!) 197.5 kg (!) 198 kg (!) 195.4 kg    10.  Atrial fibrillation/prolonged QT  Continue Eliquis 5 mg twice daily, Tikosyn 500 mg twice daily, Inderal 80 mg 3 times daily. Patient would like referral to cardiologist in [place], discussed we  can place for her on d/c 11.  Diabetes mellitus.  Hemoglobin A1c 6.1.  SSI.  Consider restart Glucotrol 10 mg twice daily. Prior use of Ozempic/Dupixent? Resume as needed needed  -12/25 CBGs well controlled, continue to monitor  -12/26 Called pharmacy regarding Ozempic, family to bring medicine in from home for tomorrow/Thursday CBG (last 3)   -12/27 well controlled, she asked about restarting glucotrol, will hold off on this as CBGs well controlled and would not want to increase risk of hypoglycemia  -12/28 had ozembic yesterday  12/30-12/31- CBGs usually controlled- con't regimen- and monitor trend for Bg's >200 x1 in last 24 hours Recent Labs    03/09/22 2042 03/10/22 0541 03/10/22 1201  GLUCAP 153* 129* 108*   12.  Morbid obesity.  BMI 86.80.  Dietary follow-up.  History of gastric sleeve 2016 and nonadherence to diet.  12/30- BMI 72.46 now 13.  GERD.  Protonix 14.  Iron deficiency anemia  -12/25 stable at HGB 10.5 15. Constipation: add slow mag. Resolved.  16. R chronic thigh pain. Reported to have hx of lumbar radiculopathy. Pain in this area could also be lateral femoral cutaneous nerve irritation. Continue lyrica. Discussed [facility] outpatient and she was interested 69. Hypotension/AKI: d/c Losartan, discuss with patient's cardiologist whether spironolactone can be decreased Monitor response to medication change  -12/28 Restart bumex, Cr down to 0.87/bun 10-stable  -12/29  will decrease propranolol from 80mg  to 60mg  TID, hold if SBP <100  12/30- Bumex restarted to 2 mg since labs OK    03/10/2022   10:35 AM 03/10/2022    5:42 AM 03/09/2022    7:26 PM  Vitals with BMI  Weight 430 lbs 12 oz    BMI 71.69    Systolic  129 101  Diastolic  60 55  Pulse  89 70    18. Hypokalemia:   -K+ stable at 3.8 after 05/09/2022 yesterday, start 03/11/2022 daily supplement  -12/18 K+3.4 will give today, increase daily supplement to 1/19  -Recheck BMP   12/30- K+ 3.7 19. Nausea:   -Improved, continue scopolamine patch for now 20. Eczema   -Dupixent-due today 12/28 21. L knee swelling/pain  12/30- has severe OA of L knee- asked Ortho PA to see if injection would be possible  12/31- no fluid removed, but got steroid injection- pt asking about gel injections- we went over these as well as celestone- long acting steroid- since has severe DJD, unlikely regular steroid injection to last more than 6 weeks- educated pt to keep track of this- to see Jessie/ortho outpt.   I spent a total of 37   minutes on total care today- >50% coordination of care- due to  Education of pt about choices for L knee pain/severe DJD.   LOS: 10 days A FACE TO FACE EVALUATION WAS PERFORMED  1/31 Paulett Kaufhold 03/10/2022, 12:46 PM

## 2022-03-10 NOTE — Progress Notes (Signed)
Inpatient Rehabilitation Care Coordinator Discharge Note   Patient Details  Name: Alyssa Blanchard MRN: [id number] Date of Birth: May 19, 1970   Discharge location: HOME WITH MOM WHO CAN BE THERE BUT NOT ASSIST  Length of Stay: 11 DAYS  Discharge activity level: SUPERVISION LEVEL  Home/community participation: ACTIVE  Patient response WC:HENIDP Literacy - How often do you need to have someone help you when you read instructions, pamphlets, or other written material from your doctor or pharmacy?: Rarely  Patient response OE:UMPNTI Isolation - How often do you feel lonely or isolated from those around you?: Rarely  Services provided included: MD, RD, PT, OT, RN, CM, TR, Pharmacy, SW  Financial Services:  Financial Services Utilized: Medicaid    Choices offered to/list presented to: PT  Follow-up services arranged:  Outpatient, DME, Patient/Family has no preference for HH/DME agencies    Outpatient Servicies: [facility] OT WILL CALL TO SET UP APPOINTMENTS DME : [facility], [place] [facility]  INFORMATION GIVEN TO CERTIFY SELF FOR MEDICAID TRANSPORT  Patient response to transportation need: Is the patient able to respond to transportation needs?: Yes In the past 12 months, has lack of transportation kept you from medical appointments or from getting medications?: Yes In the past 12 months, has lack of transportation kept you from meetings, work, or from getting things needed for daily living?: Yes    Comments (or additional information):PT DID WELL AND GOT TO THE LEVEL SHE CAN [place] MOM BEING THERE. LOOKING FORWARD TO GOING TO OP THERAPIES.   Patient/Family verbalized understanding of follow-up arrangements:  Yes  Individual responsible for coordination of the follow-up plan: SELF [phone]  Confirmed correct DME delivered: [name] 03/10/2022    [name]

## 2022-03-11 ENCOUNTER — Encounter: Payer: Self-pay | Admitting: Physician Assistant

## 2022-03-11 ENCOUNTER — Other Ambulatory Visit (HOSPITAL_COMMUNITY): Payer: Self-pay

## 2022-03-11 DIAGNOSIS — R21 Rash and other nonspecific skin eruption: Secondary | ICD-10-CM

## 2022-03-11 DIAGNOSIS — M1712 Unilateral primary osteoarthritis, left knee: Secondary | ICD-10-CM

## 2022-03-11 DIAGNOSIS — M25551 Pain in right hip: Secondary | ICD-10-CM

## 2022-03-11 LAB — BASIC METABOLIC PANEL
Anion gap: 14 (ref 5–15)
BUN: 19 mg/dL (ref 6–20)
CO2: 25 mmol/L (ref 22–32)
Calcium: 9.4 mg/dL (ref 8.9–10.3)
Chloride: 94 mmol/L — ABNORMAL LOW (ref 98–111)
Creatinine, Ser: 1.09 mg/dL — ABNORMAL HIGH (ref 0.44–1.00)
GFR, Estimated: >60 mL/min (ref >60)
Glucose, Bld: 114 mg/dL — ABNORMAL HIGH (ref 70–99)
Potassium: 3.7 mmol/L (ref 3.5–5.1)
Sodium: 133 mmol/L — ABNORMAL LOW (ref 135–145)

## 2022-03-11 LAB — GLUCOSE, CAPILLARY
Glucose-Capillary: 125 mg/dL — ABNORMAL HIGH (ref 70–99)
Glucose-Capillary: 109 mg/dL — ABNORMAL HIGH (ref 70–99)

## 2022-03-11 MED ORDER — SPIRONOLACTONE 25 MG PO TABS
25.0000 mg | ORAL_TABLET | Freq: Every day | ORAL | 0 refills | Status: DC
  Filled 2022-03-11: qty 30, 30d supply, fill #0

## 2022-03-11 MED ORDER — VITAMIN D (ERGOCALCIFEROL) 1.25 MG (50000 UNIT) PO CAPS
50000.0000 [IU] | ORAL_CAPSULE | ORAL | 0 refills | Status: DC
  Filled 2022-03-11: qty 5, 35d supply, fill #0

## 2022-03-11 MED ORDER — MELATONIN 5 MG PO TABS
10.0000 mg | ORAL_TABLET | Freq: Every day | ORAL | 0 refills | Status: DC
  Filled 2022-03-11: qty 30, 15d supply, fill #0

## 2022-03-11 MED ORDER — DOFETILIDE 500 MCG PO CAPS
500.0000 ug | ORAL_CAPSULE | Freq: Two times a day (BID) | ORAL | 0 refills | Status: DC
  Filled 2022-03-11: qty 60, 30d supply, fill #0

## 2022-03-11 MED ORDER — PANTOPRAZOLE SODIUM 40 MG PO TBEC
40.0000 mg | DELAYED_RELEASE_TABLET | Freq: Two times a day (BID) | ORAL | 0 refills | Status: DC
  Filled 2022-03-11: qty 60, 30d supply, fill #0

## 2022-03-11 MED ORDER — SCOPOLAMINE 1 MG/3DAYS TD PT72
1.0000 | MEDICATED_PATCH | TRANSDERMAL | 0 refills | Status: DC
  Filled 2022-03-11: qty 10, 30d supply, fill #0

## 2022-03-11 MED ORDER — SILVER SULFADIAZINE 1 % EX CREA
1.0000 | TOPICAL_CREAM | Freq: Every day | CUTANEOUS | 0 refills | Status: DC
  Filled 2022-03-11: qty 85, 30d supply, fill #0

## 2022-03-11 MED ORDER — MAGNESIUM OXIDE 400 MG PO TABS
1.0000 | ORAL_TABLET | Freq: Every day | ORAL | 0 refills | Status: DC
  Filled 2022-03-11: qty 30, 30d supply, fill #0

## 2022-03-11 MED ORDER — FLUTICASONE FUROATE-VILANTEROL 200-25 MCG/ACT IN AEPB
1.0000 | INHALATION_SPRAY | Freq: Every day | RESPIRATORY_TRACT | 0 refills | Status: DC
  Filled 2022-03-11: qty 60, 30d supply, fill #0

## 2022-03-11 MED ORDER — BUMETANIDE 1 MG PO TABS
1.0000 mg | ORAL_TABLET | Freq: Every day | ORAL | 0 refills | Status: DC
  Filled 2022-03-11: qty 30, 30d supply, fill #0

## 2022-03-11 MED ORDER — APIXABAN 5 MG PO TABS
5.0000 mg | ORAL_TABLET | Freq: Two times a day (BID) | ORAL | 0 refills | Status: DC
  Filled 2022-03-11: qty 60, 30d supply, fill #0

## 2022-03-11 MED ORDER — PROPRANOLOL HCL 60 MG PO TABS
60.0000 mg | ORAL_TABLET | Freq: Three times a day (TID) | ORAL | 0 refills | Status: DC
  Filled 2022-03-11: qty 90, 30d supply, fill #0

## 2022-03-11 MED ORDER — ALBUTEROL SULFATE HFA 108 (90 BASE) MCG/ACT IN AERS
2.0000 | INHALATION_SPRAY | Freq: Four times a day (QID) | RESPIRATORY_TRACT | 12 refills | Status: DC | PRN
  Filled 2022-03-11: qty 18, 30d supply, fill #0

## 2022-03-11 MED ORDER — ACETAMINOPHEN 325 MG PO TABS: 325.0000 mg | ORAL_TABLET | ORAL | Status: DC | PRN

## 2022-03-11 MED ORDER — OXYCODONE HCL 5 MG PO TABS
5.0000 mg | ORAL_TABLET | ORAL | 0 refills | Status: DC | PRN
  Filled 2022-03-11: qty 30, 5d supply, fill #0

## 2022-03-11 MED ORDER — POTASSIUM CHLORIDE CRYS ER 20 MEQ PO TBCR: 20.0000 meq | EXTENDED_RELEASE_TABLET | Freq: Every day | ORAL | Status: DC

## 2022-03-11 MED ORDER — PREGABALIN 100 MG PO CAPS
100.0000 mg | ORAL_CAPSULE | Freq: Two times a day (BID) | ORAL | 0 refills | Status: DC
  Filled 2022-03-11: qty 60, 30d supply, fill #0

## 2022-03-11 MED ORDER — BUSPIRONE HCL 10 MG PO TABS
10.0000 mg | ORAL_TABLET | Freq: Two times a day (BID) | ORAL | 0 refills | Status: DC
  Filled 2022-03-11: qty 60, 30d supply, fill #0

## 2022-03-11 MED ORDER — ALBUTEROL SULFATE HFA 108 (90 BASE) MCG/ACT IN AERS: 2.0000 | INHALATION_SPRAY | Freq: Four times a day (QID) | RESPIRATORY_TRACT | 12 refills | Status: DC | PRN

## 2022-03-11 NOTE — Progress Notes (Signed)
Inpatient Rehabilitation Discharge Medication Review by a Pharmacist  A complete drug regimen review was completed for this patient to identify any potential clinically significant medication issues.  High Risk Drug Classes Is patient taking? Indication by Medication  Antipsychotic Yes Buspar: anxiety/mood, h/o bipolar disorder  Anticoagulant Yes Eliquis: Afib  Antibiotic No   Opioid Yes Oxycodone: pain  Antiplatelet No   Hypoglycemics/insulin Yes Semaglutide(Ozempic), glucotrol: DM  Vasoactive Medication Yes Bumex, aldactone, losartan, tikosyn, inderal: CHF/AFib/blood pressure  Chemotherapy No   Other Yes Dupixent-Prurigo nodulari (puritus) Protonix: GERD Lyrica, Tylenol: pain Melatonin: sleep Breo Ellipta, Albuterol inhaler respiratory, Obesity hypoventilation syndrome/possible OSA Potassium chloride tablet- potassium supplement Ergocalciferol- vitamin D deficiency Diclofenac topical gel- muscle pain     Type of Medication Issue Identified Description of Issue Recommendation(s)  Drug Interaction(s) (clinically significant)     Duplicate Therapy     Allergy     No Medication Administration End Date     Incorrect Dose     Additional Drug Therapy Needed     Significant med changes from prior encounter (inform family/care partners about these prior to discharge). Losartan, atarax  Losartan discontinued by MD. Atarax  discontinued as she reports this caused Qtc/cardiac issues in past  Other       Clinically significant medication issues were identified that warrant physician communication and completion of prescribed/recommended actions by midnight of the next day:  No   Time spent performing this drug regimen review (minutes): 30   Thank you for allowing [place] to participate in this patients care. Nicole Cella, RPh Clinical Pharmacist  03/11/2022 8:10 AM  **Pharmacist phone directory can be found on [email or web address] listed under [place]**

## 2022-03-11 NOTE — Progress Notes (Addendum)
PROGRESS NOTE   Subjective/Complaints: Pt looking forward to going home but also a little anxious about the change.  Has done well with stairs. Knee pain is improved.  She has a rash on her upper chest and arms that improved with hydrocortisone.   ROS:   Pt denies SOB, abd pain, CP, N/V/C/D, and vision changes, +right thigh pain ,+ rash  Except per HPI   Objective:   No results found. No results for input(s): "WBC", "HGB", "HCT", "PLT" in the last 72 hours.  No results for input(s): "NA", "K", "CL", "CO2", "GLUCOSE", "BUN", "CREATININE", "CALCIUM" in the last 72 hours.   Intake/Output Summary (Last 24 hours) at 03/11/2022 0928 Last data filed at 03/11/2022 [id number] Gross per 24 hour  Intake 720 ml  Output 3400 ml  Net -2680 ml         Physical Exam: Vital Signs Blood pressure 119/65, pulse 65, temperature 98 F (36.7 C), temperature source Axillary, resp. rate 18, height 5\' 5"  (1.651 m), weight (!) 196.6 kg, SpO2 97 %. Gen: no distress, normal appearing HEENT: oral mucosa pink and moist, NCAT Cardio: Reg rate Chest: normal effort, normal rate of breathing Abd: soft, non-distended Ext: no edema Psych: pleasant, normal affect Skin: slight rash on trunk and arms Neuro:  Patient is alert and awake.  Makes eye contact with examiner and follows commands.  Provides name and age.  Fair insight and awareness. FNT intact.  Moving all 4 extremites Altered sensation L thigh  Musculoskeletal: Tenderness at b/l hips, L knee looks less swollen; less TTP     Assessment/Plan: 1. Functional deficits which require 3+ hours per day of interdisciplinary therapy in a comprehensive inpatient rehab setting. Physiatrist is providing close team supervision and 24 hour management of active medical problems listed below. Physiatrist and rehab team continue to assess barriers to discharge/monitor patient progress toward functional and medical  goals  Care Tool:  Bathing    Body parts bathed by patient: Right arm, Left arm, Chest, Face, Abdomen, Left upper leg, Right upper leg, Right lower leg, Left lower leg, Front perineal area, Buttocks   Body parts bathed by helper: Front perineal area, Buttocks     Bathing assist Assist Level: Supervision/Verbal cueing     Upper Body Dressing/Undressing Upper body dressing   What is the patient wearing?: Pull over shirt    Upper body assist Assist Level: Independent    Lower Body Dressing/Undressing Lower body dressing      What is the patient wearing?: Pants, Underwear/pull up     Lower body assist Assist for lower body dressing: Independent with assitive device Assistive Device Comment: reacher   Toileting Toileting    Toileting assist Assist for toileting: Independent with assistive device Assistive Device Comment: toileting aid   Transfers Chair/bed transfer  Transfers assist     Chair/bed transfer assist level: Independent with assistive device Chair/bed transfer assistive device: Programmer, multimedia   Ambulation assist      Assist level: Independent with assistive device Assistive device: Walker-rolling Max distance: 150'   Walk 10 feet activity   Assist     Assist level: Independent with assistive device Assistive device:  Walker-rolling   Walk 50 feet activity   Assist Walk 50 feet with 2 turns activity did not occur: Safety/medical concerns (fatigue)  Assist level: Independent with assistive device Assistive device: Walker-rolling    Walk 150 feet activity   Assist Walk 150 feet activity did not occur: Safety/medical concerns (fatigue)  Assist level: Independent with assistive device Assistive device: Walker-rolling    Walk 10 feet on uneven surface  activity   Assist Walk 10 feet on uneven surfaces activity did not occur: Safety/medical concerns (fatigue)   Assist level: Independent with assistive  device Assistive device: Walker-rolling   Wheelchair     Assist Is the patient using a wheelchair?: No Type of Wheelchair: Manual    Wheelchair assist level: Minimal Assistance - Patient > 75% Max wheelchair distance: 10    Wheelchair 50 feet with 2 turns activity    Assist        Assist Level: Total Assistance - Patient < 25%   Wheelchair 150 feet activity     Assist      Assist Level: Total Assistance - Patient < 25%   Blood pressure 119/65, pulse 65, temperature 98 F (36.7 C), temperature source Axillary, resp. rate 18, height 5\' 5"  (1.651 m), weight (!) 196.6 kg, SpO2 97 %.  Medical Problem List and Plan: 1. Functional deficits secondary to debility/morbid obesity/right lower extremity cellulitis             -patient may  shower             -ELOS/Goals: 1/2 Mod I/supervision             -Continue [facility] bed has been ordered   -Reports she did well with steps yesterday!  12/30- asked ortho to see pt for possible L knee tap for fluid- they asked for L knee xray which was done- shows severe L knee OA  Con't CIR- PT and OT- knee pain doing better  -DC home today 2.  Antithrombotics: -DVT/anticoagulation:  Pharmaceutical: Eliquis             -antiplatelet therapy: N/A 3. Lower extremity pain: Lyrica 100 mg twice daily, oxycodone as needed- increased to q4H prn  12/30-12/31- pain tolerable- con't regimen 4. Mood/Behavior/Sleep: BuSpar 10 mg twice daily             -antipsychotic agents: Provide emotional support             -Hx of bipolar 1 disorder             -DC atarax as she reports this caused Qtc/cardiac issues in past 5. Neuropsych/cognition: This patient is capable of making decisions on her own behalf. 6. Skin/Wound Care: Routine skin checks 7. Fluids/Electrolytes/Nutrition: Routine in and outs with follow-up chemistries 8.  Right leg cellulitis.  Completing course 5 days Ancef. 9.  Chronic combined CHF.  Bumex 2 mg daily, Aldactone 25  mg daily.,  Cozaar 25 mg daily. monitor for any fluid overload.  -No signs of fluid overload at this time  -Restart bumex  -COZAAR discontinued  -Will ask nursing to recheck weight  1/1: down nearly 3kg and patient asks if Bumex can be reduced, will reduce to 1mg  and reweigh tomorrow. 1/2 weight a little up from yesterday but stable overall, continue Bumex 1mg   Filed Weights   03/09/22 0605 03/10/22 1035 03/11/22 0516  Weight: (!) 198 kg (!) 195.4 kg (!) 196.6 kg    10.  Atrial fibrillation/prolonged QT  Continue Eliquis 5  mg twice daily, Tikosyn 500 mg twice daily, Inderal 80 mg 3 times daily. Patient would like referral to cardiologist in [place], discussed we can place for her on d/c 11.  Diabetes mellitus.  Hemoglobin A1c 6.1.  SSI.  Consider restart Glucotrol 10 mg twice daily. Prior use of Ozempic/Dupixent? Resume as needed needed  -12/25 CBGs well controlled, continue to monitor  -12/26 Called pharmacy regarding Ozempic, family to bring medicine in from home for tomorrow/Thursday CBG (last 3)   -12/27 well controlled, she asked about restarting glucotrol, will hold off on this as CBGs well controlled and would not want to increase risk of hypoglycemia  -12/28 had ozembic yesterday  12/30-12/31- CBGs usually controlled- con't regimen- and monitor trend for Bg's >200 x1 in last 24 hours  03/11/22 well controlled Recent Labs    03/10/22 1616 03/10/22 2122 03/11/22 0515  GLUCAP 139* 149* 125*    12.  Morbid obesity.  BMI 86.80.  Dietary follow-up.  History of gastric sleeve 2016 and nonadherence to diet.  12/30- BMI 72.46 now 13.  GERD.  Protonix 14.  Iron deficiency anemia  -12/25 stable at HGB 10.5 15. Constipation: add slow mag. Resolved.  16. R chronic thigh pain. Reported to have hx of lumbar radiculopathy. Pain in this area could also be lateral femoral cutaneous nerve irritation. Continue lyrica. Discussed [facility] outpatient and she was interested  -Happy to see as  outpatient if patient interested 15. Hypotension/AKI: d/c Losartan, discuss with patient's cardiologist whether spironolactone can be decreased Monitor response to medication change  -12/28 Restart bumex, Cr down to 0.87/bun 10-stable  -12/29 will decrease propranolol from 80mg  to 60mg  TID, hold if SBP <100  12/30- Bumex restarted to 2 mg since labs OK  Recheck labs with PCP as outpatient    03/11/2022    5:16 AM 03/11/2022    2:28 AM 03/10/2022    8:23 PM  Vitals with BMI  Weight 433 lbs 7 oz    BMI 28.36    Systolic  629 476  Diastolic  65 57  Pulse  65 69    18. Hypokalemia:   -K+ stable at 3.8 after 61meq yesterday, start 76meq daily supplement  -12/18 K+3.4 will give 88meq today, increase daily supplement to 11meq  -Recheck BMP   12/30- K+ 3.7  Will order recheck, Recheck labs again with PCP as outpatient 19. Nausea:   -Improved, continue scopolamine patch for now 20. Eczema   -Dupixent-due today 12/28 21. L knee swelling/pain  12/30- has severe OA of L knee- asked Ortho PA to see if injection would be possible  12/31- no fluid removed, but got steroid injection- pt asking about gel injections- we went over these as well as celestone- long acting steroid- since has severe DJD, unlikely regular steroid injection to last more than 6 weeks- educated pt to keep track of this- to see Jessie/ortho outpt.   -Improved with cortisone injection 21. Rash on trunk and arms.  -Improved with hydrocortisone cream. Avoid any new lotions, soaps, detergents, foods.   LOS: 11 days A FACE TO FACE EVALUATION WAS PERFORMED  Jennye Boroughs 03/11/2022, 9:28 AM

## 2022-03-18 ENCOUNTER — Other Ambulatory Visit (HOSPITAL_COMMUNITY): Payer: Self-pay

## 2022-03-19 ENCOUNTER — Ambulatory Visit: Payer: Medicaid Other | Attending: Physical Medicine & Rehabilitation | Admitting: Occupational Therapy

## 2022-03-19 ENCOUNTER — Other Ambulatory Visit: Payer: Self-pay

## 2022-03-19 ENCOUNTER — Ambulatory Visit: Payer: Medicaid Other

## 2022-03-19 DIAGNOSIS — R2681 Unsteadiness on feet: Secondary | ICD-10-CM

## 2022-03-19 DIAGNOSIS — M6281 Muscle weakness (generalized): Secondary | ICD-10-CM

## 2022-03-19 DIAGNOSIS — R5381 Other malaise: Secondary | ICD-10-CM | POA: Insufficient documentation

## 2022-03-19 DIAGNOSIS — R262 Difficulty in walking, not elsewhere classified: Secondary | ICD-10-CM

## 2022-03-19 DIAGNOSIS — R2689 Other abnormalities of gait and mobility: Secondary | ICD-10-CM | POA: Insufficient documentation

## 2022-03-19 NOTE — Therapy (Signed)
OUTPATIENT OCCUPATIONAL THERAPY NEURO EVALUATION  Patient Name: Alyssa Blanchard MRN: [id number] DOB:January 10, 1971, 52 y.o., female Today's Date: 03/19/2022  PCP: Randel Pigg, Dorma Russell, MD REFERRING PROVIDER: Fanny Dance, MD  END OF SESSION:   Past Medical History:  Diagnosis Date   Anxiety    Asthma    Atrial fibrillation [facility])    Depression    DM (diabetes mellitus), type 2 (HCC) 02/16/2022   Dysrhythmia    new onset Afib rvr   GERD (gastroesophageal reflux disease)    Heart failure (HCC)    Heel spur    bilat   History of bronchitis    History of chicken pox    History of urinary tract infection    Migraines    Plantar fasciitis    bilat   STD (sexually transmitted disease)    chl hx & hsv 1&2   Tremors of nervous system    Urinary incontinence    Past Surgical History:  Procedure Laterality Date   CARDIOVERSION N/A 08/05/2019   Procedure: CARDIOVERSION;  Surgeon: [name] Deloris Ping, MD;  Location: [facility] ENDOSCOPY;  Service: Cardiovascular;  Laterality: N/A;   LAPAROSCOPIC GASTRIC SLEEVE RESECTION N/A 11/27/2014   Procedure: LAPAROSCOPIC GASTRIC SLEEVE RESECTION WITH HIATAL HERNIA REPAIR UPPER ENDOSCOPY;  Surgeon: Luretha Murphy, MD;  Location: [facility] ORS;  Service: General;  Laterality: N/A;   TEE WITHOUT CARDIOVERSION N/A 08/05/2019   Procedure: TRANSESOPHAGEAL ECHOCARDIOGRAM (TEE);  Surgeon: Vesta Mixer, MD;  Location: [facility] ENDOSCOPY;  Service: Cardiovascular;  Laterality: N/A;   TONSILLECTOMY  1978   Patient Active Problem List   Diagnosis Date Noted   Debility 02/28/2022   Prolonged QT interval 02/17/2022   Cellulitis of right leg 02/16/2022   Lumbar radiculopathy, right 02/16/2022   Anemia 02/16/2022   Iron deficiency anemia 02/02/2022   Type 2 diabetes mellitus without complication, without long-term current use of insulin (HCC) 12/25/2021   Bipolar 1 disorder (HCC) 01/30/2020   Chronic combined systolic and diastolic congestive heart failure (HCC)  09/25/2019   Acute pulmonary edema (HCC)    Paroxysmal atrial fibrillation (HCC) 08/02/2019   Bipolar disorder, in full remission, most recent episode manic (HCC) 04/15/2019   Psychosis (HCC) 04/14/2019   Not well controlled moderate persistent asthma 02/02/2018   Chronic rhinitis 02/02/2018   Status asthmaticus 11/29/2017   Acute respiratory failure with hypoxia (HCC) 11/29/2017   Gastroesophageal reflux disease 09/01/2017   Cough 12/31/2016   Dysuria 07/28/2016   Clinical infection 07/28/2016   History of asthma 04/17/2016   Acute bronchitis 04/17/2016   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   Migraine without aura and without status migrainosus, not intractable 10/10/2014   Migraine with aura and without status migrainosus, not intractable 08/29/2014   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 classified elsewhere 08/29/2014   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  Rationale for Evaluation and Treatment: Rehabilitation  SUBJECTIVE:   SUBJECTIVE STATEMENT: Pt reports that she has been in and out of the hospital since October, had a short stint  in a nursing home, and most recently was in [place] where she reports significant improvements.  Pt reports significant pain in R thigh.  Pt reports that she utilizes [facility] at home, RW for longer distances and in the community.  Pt reports prolonged standing is very hard, due to decreased endurance. Pt accompanied by:  self  PERTINENT HISTORY: ***  PRECAUTIONS: Fall  WEIGHT BEARING RESTRICTIONS: No  PAIN:  Are you having pain? Yes: NPRS scale: 6/10 Pain location: R thigh Pain description: burning, stabbing Aggravating factors: inconsistent Relieving factors: Lyrica, oxycodone will "keep it at [place]"  FALLS: Has patient fallen in last 6 months? Yes. Number of falls 1  LIVING ENVIRONMENT: Lives with:  lives with 97 yo mother Lives in: House/apartment Stairs: Yes: Internal: 1 step down to sunken living room, which is where pt sleeps;  and External: 4 steps; bilateral but cannot reach both Has following equipment at home: Single point cane, Walker - 2 wheeled, shower chair, and bed side commode  PLOF:  requiring assistance for ~1 year due to fluid overload and difficulty with breathing, Mother was assisting with everything  PATIENT GOALS: to walk unassisted  OBJECTIVE:   HAND DOMINANCE: Right  ADLs: Transfers/ambulation related to ADLs: Mod I with RW/SPC Grooming: decreased endurance with standing grooming tasks UB Dressing: Mod I, mom will assist with setup of items LB Dressing: Mod I with use of AE,  Toileting: Increased time/effort with clothing management, does utilize toilet aid for hygiene Bathing: sponge bathing at sink currently Tub Shower transfers: have not attempted  Equipment: Shower seat with back, bed side commode, Reacher, Sock aid, Long handled shoe horn, and Long handled sponge  IADLs: Shopping: grocery delivery Light housekeeping: Mother is completing Meal Prep: Mother is completing Community mobility: currently using transportation Medication management: Mod I with pill box Financial management: Mother is completing  MOBILITY STATUS: Needs Assist: RW in community or long distances, SPC in home  POSTURE COMMENTS:  {posture:25561} Sitting balance: {sitting balance:25483}  ACTIVITY TOLERANCE: Activity tolerance: decreased endurance  FUNCTIONAL OUTCOME  MEASURES: {OTFUNCTIONALMEASURES:27238}  UPPER EXTREMITY ROM:    {AROM/PROM:27142} ROM Right eval Left eval  Shoulder flexion    Shoulder abduction    Shoulder adduction    Shoulder extension    Shoulder internal rotation    Shoulder external rotation    Elbow flexion    Elbow extension    Wrist flexion    Wrist extension    Wrist ulnar deviation    Wrist radial deviation    Wrist pronation    Wrist supination    (Blank rows = not tested)  UPPER EXTREMITY MMT:     MMT Right eval Left eval  Shoulder flexion    Shoulder abduction    Shoulder adduction    Shoulder extension    Shoulder internal rotation    Shoulder external rotation    Middle trapezius    Lower trapezius    Elbow flexion    Elbow extension    Wrist flexion    Wrist extension    Wrist ulnar deviation    Wrist radial deviation    Wrist pronation    Wrist supination    (Blank rows = not tested)  HAND FUNCTION: Grip strength: Right: 56 lbs; Left: 45 lbs  COORDINATION: 9 Hole Peg test: Right: 24.34 sec; Left: 28.13 sec  SENSATION: Numbness in R thigh  COGNITION: Overall cognitive status: Within functional limits for tasks assessed and feels like Lyrica and oxycodone impact her memory  and processing, "slows her down"  VISION: Subjective report: cannot use phone without reading glasses Baseline vision: Wears glasses for reading only  PERCEPTION: {Perception:25564}  PRAXIS: {Praxis:25565}  OBSERVATIONS: ***   TODAY'S TREATMENT:                                                                                                                              DATE: ***   PATIENT EDUCATION: Education details: *** Person educated: {Person educated:25204} Education method: {Education Method:25205} Education comprehension: {Education Comprehension:25206}  HOME EXERCISE PROGRAM: ***   GOALS: Goals reviewed with patient? {yes/no:20286}  SHORT TERM GOALS: Target date:  ***  *** Baseline: Goal status: {GOALSTATUS:25110}  2.  *** Baseline:  Goal status: {GOALSTATUS:25110}  3.  *** Baseline:  Goal status: {GOALSTATUS:25110}  4.  *** Baseline:  Goal status: {GOALSTATUS:25110}  5.  *** Baseline:  Goal status: {GOALSTATUS:25110}  6.  *** Baseline:  Goal status: {GOALSTATUS:25110}  LONG TERM GOALS: Target date: ***  *** Baseline:  Goal status: {GOALSTATUS:25110}  2.  *** Baseline:  Goal status: {GOALSTATUS:25110}  3.  *** Baseline:  Goal status: {GOALSTATUS:25110}  4.  *** Baseline:  Goal status: {GOALSTATUS:25110}  5.  *** Baseline:  Goal status: {GOALSTATUS:25110}  6.  *** Baseline:  Goal status: {GOALSTATUS:25110}  ASSESSMENT:  CLINICAL IMPRESSION: Patient is a *** y.o. *** who was seen today for occupational therapy evaluation for ***.   PERFORMANCE DEFICITS: in functional skills including {OT physical skills:25468}, cognitive skills including {OT cognitive skills:25469}, and psychosocial skills including {OT psychosocial skills:25470}.   IMPAIRMENTS: are limiting patient from {OT performance deficits:25471}.   CO-MORBIDITIES: {Comorbidities:25485} that affects occupational performance. Patient will benefit from skilled OT to address above impairments and improve overall function.  MODIFICATION OR ASSISTANCE TO COMPLETE EVALUATION: {OT modification:25474}  OT OCCUPATIONAL PROFILE AND HISTORY: {OT PROFILE AND HISTORY:25484}  CLINICAL DECISION MAKING: {OT CDM:25475}  REHAB POTENTIAL: {rehabpotential:25112}  EVALUATION COMPLEXITY: {Evaluation complexity:25115}    PLAN:  OT FREQUENCY: {rehab frequency:25116}  OT DURATION: {rehab duration:25117}  PLANNED INTERVENTIONS: {OT Interventions:25467}  RECOMMENDED OTHER SERVICES: ***  CONSULTED AND AGREED WITH PLAN OF CARE: {BZJ:69678}  PLAN FOR NEXT SESSIONSimonne Come, OTR/L 03/19/2022, 9:32 AM

## 2022-03-19 NOTE — Therapy (Signed)
OUTPATIENT PHYSICAL THERAPY NEURO EVALUATION   Patient Name: Alyssa Blanchard MRN: [id number] DOB:[birth year], 52 y.o., female Today's Date: 03/19/2022   PCP: Randel Pigg, Dorma Russell, MD REFERRING PROVIDER: Fanny Dance, MD  END OF SESSION:  PT End of Session - 03/19/22 1015     Visit Number 1    Number of Visits 12    Date for PT Re-Evaluation 04/30/22    Authorization Type [employer] Medicaid    PT Start Time 1015    PT Stop Time 1100    PT Time Calculation (min) 45 min             Past Medical History:  Diagnosis Date   Anxiety    Asthma    Atrial fibrillation (HCC)    Depression    DM (diabetes mellitus), type 2 (HCC) 02/16/2022   Dysrhythmia    new onset Afib rvr   GERD (gastroesophageal reflux disease)    Heart failure (HCC)    Heel spur    bilat   History of bronchitis    History of chicken pox    History of urinary tract infection    Migraines    Plantar fasciitis    bilat   STD (sexually transmitted disease)    chl hx & hsv 1&2   Tremors of nervous system    Urinary incontinence    Past Surgical History:  Procedure Laterality Date   CARDIOVERSION N/A 08/05/2019   Procedure: CARDIOVERSION;  Surgeon: [name] Deloris Ping, MD;  Location: [facility];  Service: Cardiovascular;  Laterality: N/A;   LAPAROSCOPIC GASTRIC SLEEVE RESECTION N/A 11/27/2014   Procedure: LAPAROSCOPIC GASTRIC SLEEVE RESECTION WITH HIATAL HERNIA REPAIR UPPER ENDOSCOPY;  Surgeon: Luretha Murphy, MD;  Location: [facility];  Service: General;  Laterality: N/A;   TEE WITHOUT CARDIOVERSION N/A 08/05/2019   Procedure: TRANSESOPHAGEAL ECHOCARDIOGRAM (TEE);  Surgeon: [name] Deloris Ping, MD;  Location: [facility] ENDOSCOPY;  Service: Cardiovascular;  Laterality: N/A;   TONSILLECTOMY  1978   Patient Active Problem List   Diagnosis Date Noted   Debility 02/28/2022   Prolonged QT interval 02/17/2022   Cellulitis of right leg 02/16/2022   Lumbar radiculopathy, right 02/16/2022   Anemia 02/16/2022   Iron  deficiency anemia 02/02/2022   Type 2 diabetes mellitus without complication, without long-term current use of insulin (HCC) 12/25/2021   Bipolar 1 disorder (HCC) 01/30/2020   Chronic combined systolic and diastolic congestive heart failure (HCC) 09/25/2019   Acute pulmonary edema (HCC)    Paroxysmal atrial fibrillation (HCC) 08/02/2019   Bipolar disorder, in full remission, most recent episode manic (HCC) 04/15/2019   Psychosis (HCC) 04/14/2019   Not well controlled moderate persistent asthma 02/02/2018   Chronic rhinitis 02/02/2018   Status asthmaticus 11/29/2017   Acute respiratory failure with hypoxia (HCC) 11/29/2017   Gastroesophageal reflux disease 09/01/2017   Cough 12/31/2016   Dysuria 07/28/2016   Clinical infection 07/28/2016   History of asthma 04/17/2016   Acute bronchitis 04/17/2016   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   Migraine without aura and without status migrainosus, not intractable 10/10/2014   Migraine with aura and without status migrainosus, not intractable 08/29/2014   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 classified elsewhere 08/29/2014   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   Snorings 07/10/2014   Sleep related headaches 07/10/2014   Nocturia more than twice per night 07/10/2014   Preventative health care 07/30/2012   Class 3 severe obesity due to excess calories with serious comorbidity and body mass index (BMI) greater than  or equal to 15 in adult [facility]) 07/30/2012    ONSET DATE: December  REFERRING DIAG: R53.81 (ICD-10-CM) - Other malaise I50.42 (ICD-10-CM) - Chronic combined systolic (congestive) and diastolic (congestive) heart failure  THERAPY DIAG:  Difficulty in walking, not elsewhere classified  Muscle weakness (generalized)  Other abnormalities of gait and mobility  Unsteadiness on feet  Rationale for Evaluation and Treatment: Rehabilitation  SUBJECTIVE:                                                                                                                                                                                              SUBJECTIVE STATEMENT: Pt was hospitalized for debility and spent 2 weeks in inpatient rehab and continues to have mobility deficits.  Notes that her stamina is greatly limited.  Pt reports right thigh pain which presents as stabbing and burning along right buttocks and anterior thigh. Notes that prolonged sitting increases the symptoms.  Pt accompanied by: self  PERTINENT HISTORY: 52 year old right-handed female with history of chronic anemia, lumbar radiculopathy, fibromyalgia, morbid obesity BMI 86.80 with gastric sleeve 2016 and nonadherence to diet, atrial fibrillation maintained on Eliquis as well as Tikosyn with limited cardiology follow-up Dr. Glorious Peach, bipolar disorder and patient reportedly stopped all psychiatric meds 2/2 secondary to side effects and is not currently seeing a psychiatrist, chronic combined congestive heart failure with ejection fraction of 30 to 35% by TEE 01/31/2020, diabetes mellitus  PAIN:  Are you having pain? Yes: NPRS scale: 6/10 Pain location: right hip-anterior thigh Pain description: aching, burning, stabbing Aggravating factors: prolonged sitting Relieving factors: standing, medication  PRECAUTIONS: Fall  WEIGHT BEARING RESTRICTIONS: No  FALLS: Has patient fallen in last 6 months? Yes. Number of falls 1  LIVING ENVIRONMENT: Lives with: lives with their family, elderly mother Lives in: House/apartment Stairs: Yes: External: 4 steps; on right going up Has following equipment at home: Gilford Rile - 2 wheeled Currently living in dining room/living room and sleeping in hospital bed or recliner  PLOF: Independent with household mobility with device and Independent with community mobility with device  PATIENT GOALS: decr need for AD, be able to complete car transfers, be independent,   OBJECTIVE:   DIAGNOSTIC  FINDINGS:   Vitals: 97%, 79 bpm  COGNITION: Overall cognitive status: Within functional limits for tasks assessed   SENSATION: WFL  COORDINATION: WNL  EDEMA:  None present  MUSCLE TONE: WNL  MUSCLE LENGTH:   DTRs:  NT  POSTURE:   LOWER EXTREMITY ROM:    WNK grossly  LOWER EXTREMITY MMT:    MMT Right Eval Left Eval  Hip flexion 3 4  Hip extension    Hip abduction    Hip adduction    Hip internal rotation    Hip external rotation    Knee flexion 4 4  Knee extension 4 4  Ankle dorsiflexion 5 5  Ankle plantarflexion 3 3  Ankle inversion    Ankle eversion    (Blank rows = not tested)  BED MOBILITY:  NT  TRANSFERS: Assistive device utilized: Environmental consultant - 2 wheeled  Sit to stand: Modified independence Stand to sit: Complete Independence Chair to chair: Modified independence Floor:  NT Pt reports she is unable to perform car transfers at this time due to inbility to advance RLE under steering wheel RAMP:  NT  CURB:  Level of Assistance: SBA Assistive device utilized: Environmental consultant - 2 wheeled Curb Comments:   STAIRS: Level of Assistance: SBA Stair Negotiation Technique: Step to Pattern with Bilateral Rails Number of Stairs: 4  Height of Stairs: 4-6  Comments:   GAIT: Gait pattern: WFL Distance walked: 114 Assistive device utilized: Walker - 2 wheeled Level of assistance: Modified independence Comments:   FUNCTIONAL TESTS:  5 times sit to stand: 18.75 2 minute walk test: 114 ft; 93%, 82 bpm   PATIENT SURVEYS:    TODAY'S TREATMENT:                                                                                                                              DATE:     PATIENT EDUCATION: Education details: PT intervention. Initiated home activities with YouTube video search of "Seated exercise for seniors"  Person educated: Patient Education method: Customer service manager Education comprehension: verbalized understanding  HOME EXERCISE  PROGRAM: TBD  GOALS: Goals reviewed with patient? Yes  SHORT TERM GOALS: Target date: 04/02/2022    Patient will be independent in HEP to improve functional outcomes Baseline: Goal status: INITIAL  2.  Demo improved ambulation tolerance/endurance as evidenced by distance of 150 ft during 2MWT Baseline: 114 ft w/ RW Goal status: INITIAL    LONG TERM GOALS: Target date: 04/30/2022      Demo ability to ambulate 170 ft during 2MWT without AD to increase safety with mobility Baseline: 114 ft with RW Goal status: INITIAL  2.  Demo improved BLE strength and balance per time of 13 sec 5xSTS Baseline: 18 sec, 18" seat height Goal status: INITIAL  3.  Demo modified independence for car transfers on driver side to enable return to driving Baseline: unable Goal status: INITIAL  4. Reveal low risk for falls per time 12 sec TUG test with or without AD  Baseline: NT  Goal status: INITIAL  ASSESSMENT:  CLINICAL IMPRESSION: Patient is a 52 y.o. lady who was seen today for physical therapy evaluation and treatment for malaise and CHF who demonstrates reduced strength, activity tolerance, and mobility in general as a result.  Pt exhibits generalized weakness and reduced activity/gait tolerance per assessment findings with increased risk for falls and limitations in  her gait distance/tolerance and safety with stair negotiation.  Pt would benefit from PT services for intervention to improve mobility, provide training in general conditioning for health/wellness, and reduce risk for falls to promote greater activity level and quality of life to restore abilities to PLOF   OBJECTIVE IMPAIRMENTS: Abnormal gait, cardiopulmonary status limiting activity, decreased activity tolerance, decreased balance, decreased mobility, difficulty walking, decreased strength, impaired perceived functional ability, obesity, and pain.   ACTIVITY LIMITATIONS: carrying, lifting, bending, standing, stairs, transfers,  and locomotion level  PARTICIPATION LIMITATIONS: meal prep, cleaning, laundry, interpersonal relationship, driving, shopping, and community activity  PERSONAL FACTORS: Past/current experiences, Time since onset of injury/illness/exacerbation, and 3+ comorbidities: CHF, obesity, A-fib, Bipolar  are also affecting patient's functional outcome.   REHAB POTENTIAL: Good  CLINICAL DECISION MAKING: Evolving/moderate complexity  EVALUATION COMPLEXITY: Moderate  PLAN:  PT FREQUENCY: 1-2x/week  PT DURATION: 6 weeks  PLANNED INTERVENTIONS: Therapeutic exercises, Therapeutic activity, Neuromuscular re-education, Balance training, Gait training, Patient/Family education, Self Care, Joint mobilization, Stair training, Vestibular training, Canalith repositioning, Orthotic/Fit training, DME instructions, Aquatic Therapy, Dry Needling, Electrical stimulation, Spinal mobilization, Cryotherapy, Moist heat, Taping, Ionotophoresis 4mg /ml Dexamethasone, and Manual therapy  PLAN FOR NEXT SESSION: ask about which YouTube exercise videos she has been performing, develop HEP as indicated, TUG test   4:44 PM, 03/19/22 M. 05/18/22, PT, DPT Physical Therapist- [place] Office Number: [phone]

## 2022-03-20 ENCOUNTER — Ambulatory Visit: Payer: Medicaid Other

## 2022-03-20 MED ORDER — SODIUM CHLORIDE 0.9 % IV SOLN
1000.0000 mg | Freq: Once | INTRAVENOUS | Status: DC
  Filled 2022-03-20: qty 10

## 2022-03-20 MED ORDER — ACETAMINOPHEN 325 MG PO TABS: 650.0000 mg | ORAL_TABLET | Freq: Once | ORAL | Status: DC

## 2022-03-21 ENCOUNTER — Ambulatory Visit: Payer: Medicaid Other | Admitting: Occupational Therapy

## 2022-03-21 ENCOUNTER — Ambulatory Visit: Payer: Medicaid Other

## 2022-03-21 ENCOUNTER — Telehealth: Payer: Self-pay | Admitting: Physical Medicine & Rehabilitation

## 2022-03-21 DIAGNOSIS — M6281 Muscle weakness (generalized): Secondary | ICD-10-CM

## 2022-03-21 DIAGNOSIS — R2689 Other abnormalities of gait and mobility: Secondary | ICD-10-CM

## 2022-03-21 DIAGNOSIS — R2681 Unsteadiness on feet: Secondary | ICD-10-CM

## 2022-03-21 DIAGNOSIS — R5381 Other malaise: Secondary | ICD-10-CM

## 2022-03-21 DIAGNOSIS — R262 Difficulty in walking, not elsewhere classified: Secondary | ICD-10-CM

## 2022-03-21 MED ORDER — OXYCODONE HCL 5 MG PO TABS: 5.0000 mg | ORAL_TABLET | ORAL | 0 refills | Status: DC | PRN

## 2022-03-21 NOTE — Therapy (Signed)
OUTPATIENT OCCUPATIONAL THERAPY  Treatment Note  Patient Name: Alyssa Blanchard MRN: [id number] DOB:1970-03-29, 52 y.o., female Today's Date: 03/19/2022  PCP: Patrecia Pour, Christean Grief, MD REFERRING PROVIDER: Jennye Boroughs, MD  END OF SESSION:  OT End of Session - 03/21/22 1003     Visit Number 2    Number of Visits 13    Date for OT Re-Evaluation 05/16/22    Authorization Type [place] Medicaid UHC    OT Start Time 0845    OT Stop Time 0928    OT Time Calculation (min) 43 min    Activity Tolerance Patient tolerated treatment well    Behavior During Therapy WFL for tasks assessed/performed              Past Medical History:  Diagnosis Date   Anxiety    Asthma    Atrial fibrillation ([place])    Depression    DM (diabetes mellitus), type 2 ([place]) 02/16/2022   Dysrhythmia    new onset Afib rvr   GERD (gastroesophageal reflux disease)    Heart failure ([place])    Heel spur    bilat   History of bronchitis    History of chicken pox    History of urinary tract infection    Migraines    Plantar fasciitis    bilat   STD (sexually transmitted disease)    chl hx & hsv 1&2   Tremors of nervous system    Urinary incontinence    Past Surgical History:  Procedure Laterality Date   CARDIOVERSION N/A 08/05/2019   Procedure: CARDIOVERSION;  Surgeon: Acie Fredrickson Wonda Cheng, MD;  Location: [facility];  Service: Cardiovascular;  Laterality: N/A;   LAPAROSCOPIC GASTRIC SLEEVE RESECTION N/A 11/27/2014   Procedure: LAPAROSCOPIC GASTRIC SLEEVE RESECTION WITH HIATAL HERNIA REPAIR UPPER ENDOSCOPY;  Surgeon: Johnathan Hausen, MD;  Location: [facility];  Service: General;  Laterality: N/A;   TEE WITHOUT CARDIOVERSION N/A 08/05/2019   Procedure: TRANSESOPHAGEAL ECHOCARDIOGRAM (TEE);  Surgeon: Acie Fredrickson Wonda Cheng, MD;  Location: [facility] ENDOSCOPY;  Service: Cardiovascular;  Laterality: N/A;   TONSILLECTOMY  1978   Patient Active Problem List   Diagnosis Date Noted   Debility 02/28/2022   Prolonged QT interval  02/17/2022   Cellulitis of right leg 02/16/2022   Lumbar radiculopathy, right 02/16/2022   Anemia 02/16/2022   Iron deficiency anemia 02/02/2022   Type 2 diabetes mellitus without complication, without long-term current use of insulin ([place]) 12/25/2021   Bipolar 1 disorder ([place]) 01/30/2020   Chronic combined systolic and diastolic congestive heart failure ([place]) 09/25/2019   Acute pulmonary edema (HCC)    Paroxysmal atrial fibrillation ([place]) 08/02/2019   Bipolar disorder, in full remission, most recent episode manic ([place]) 04/15/2019   Psychosis ([place]) 04/14/2019   Not well controlled moderate persistent asthma 02/02/2018   Chronic rhinitis 02/02/2018   Status asthmaticus 11/29/2017   Acute respiratory failure with hypoxia ([place]) 11/29/2017   Gastroesophageal reflux disease 09/01/2017   Cough 12/31/2016   Dysuria 07/28/2016   Clinical infection 07/28/2016   History of asthma 04/17/2016   Acute bronchitis 04/17/2016   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   Migraine without aura and without status migrainosus, not intractable 10/10/2014   Migraine with aura and without status migrainosus, not intractable 08/29/2014   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 classified elsewhere 08/29/2014   Hypoventilation associated with obesity syndrome ([place]) 07/10/2014   Snorings 07/10/2014   Sleep related headaches 07/10/2014   Nocturia more than twice per night 07/10/2014   Preventative health care 07/30/2012  Class 3 severe obesity due to excess calories with serious comorbidity and body mass index (BMI) greater than or equal to 70 in adult [facility]) 07/30/2012    ONSET DATE: October 2023  REFERRING DIAG: R53.81 (ICD-10-CM) - Other malaise I50.42 (ICD-10-CM) - Chronic combined systolic (congestive) and diastolic (congestive) heart failure  THERAPY DIAG:  Muscle weakness (generalized)  Other abnormalities of gait and mobility  Unsteadiness on feet  Rationale for Evaluation and Treatment:  Rehabilitation  SUBJECTIVE:   SUBJECTIVE STATEMENT:  I have a cardiologist appt after therapy today. Pt accompanied by: self  PERTINENT HISTORY:  Presented to [facility] 02/16/2022 with increasing swelling of lower extremities and recent fall with complaints of tenderness of the right hip and erythema posterior right leg and knee.  She remains on chronic Lyrica for lumbar radiculopathy as well as oxycodone as needed.  She reports continued pain and numbness in R thigh.  X-ray revealed moderate DJD of lumbar. Patient was admitted with cellulitis of R leg, anemia, and right lumbar radiculopathy. NAT:FTDDUKG disorder, HFrEF, a fib, obesity.   PRECAUTIONS: Fall  WEIGHT BEARING RESTRICTIONS: No  PAIN:  Are you having pain? Yes: NPRS scale: 4-5/10 Pain location: R thigh Pain description: burning, stabbing Aggravating factors: inconsistent Relieving factors: Lyrica, oxycodone will "keep it at bay"  FALLS: Has patient fallen in last 6 months? Yes. Number of falls 1  LIVING ENVIRONMENT: Lives with:  lives with 50 yo mother Lives in: House/apartment Stairs: Yes: Internal: 1 step down to sunken living room, which is where pt sleeps;  and External: 4 steps; bilateral but cannot reach both Has following equipment at home: Single point cane, Walker - 2 wheeled, shower chair, and bed side commode  PLOF:  requiring assistance for ~1 year due to fluid overload and difficulty with breathing, Mother was assisting with everything  PATIENT GOALS: to walk unassisted  OBJECTIVE:   HAND DOMINANCE: Right  ADLs: Transfers/ambulation related to ADLs: Mod I with RW/SPC Grooming: decreased endurance with standing grooming tasks UB Dressing: Mod I, mom will assist with setup of items LB Dressing: Mod I with use of AE,  Toileting: Increased time/effort with clothing management, does utilize toilet aid for hygiene Bathing: sponge bathing at sink currently Tub Shower transfers: have not  attempted  Equipment: Shower seat with back, bed side commode, Reacher, Sock aid, Long handled shoe horn, and Long handled sponge  IADLs: Shopping: grocery delivery Light housekeeping: Mother is completing Meal Prep: Mother is completing Community mobility: currently using transportation Medication management: Mod I with pill box Financial management: Mother is completing  MOBILITY STATUS: Needs Assist: RW in community or long distances, SPC in home  COGNITION: Overall cognitive status: Within functional limits for tasks assessed and feels like Lyrica and oxycodone impact her memory and processing, "slows her down"   TODAY'S TREATMENT:  03/21/22 Energy conservation: Encouraged use of RW for mobility to decrease energy expenditure, especially when more fatigued as pt reports nearly out of breath when making it from bathroom to bedroom.  Reviewed planning ahead, pacing, prioritizing, and positioning to conserve energy.  OT providing demonstration and education about body positioning and technique. Breathing strategies: educated on pursed lip and diaphragmatic breathing.  Pt practiced "milkshake" breathing for focus on long deep inhale.  Educated on breathing with mobility and not holding breath. Standing: Pt tolerated standing 2:30 mins during table top activity while alternating UE support on RW and weight shifting at hips, but no forward lean this session.   PATIENT EDUCATION: Education details: educated on energy conservation strategies, breathing technique, and use of RW for energy conservation Person educated: Patient Education method: Explanation Education comprehension: verbalized understanding  HOME EXERCISE PROGRAM: TBD   GOALS: Goals reviewed with patient? Yes  SHORT TERM GOALS: Target date: 04/18/22  Pt will be independent in full body strengthening  HEP for strength/endurance. Baseline: not currently engaging in any exercises Goal status: IN PROGRESS  2.  Pt will tolerate standing 2 mins during grooming task without rest break or change in posture. Baseline: requiring seated rest break or leaning on counter with tasks <2 mins Goal status: IN PROGRESS  3.  Pt will verbalize understanding of task modifications and/or potential AE needs to increase ease, safety, and independence w/ ADLs (particularly with donning socks/shoes). Baseline: increased time/effort for donning shoes Goal status: IN PROGRESS  4.  Pt will verbalize understanding of energy conservation techniques  Baseline: fatigues quickly Goal status: IN PROGRESS   LONG TERM GOALS: Target date: 05/16/22  Pt will tolerate standing 5-10 mins to engage in simple meal prep task with </= to 1 rest break. Baseline: requiring seated rest break or leaning on counter with tasks <2 mins Goal status: IN PROGRESS  2.  Pt will demonstrate and/or report safety awareness and technique with tub/shower transfer to allow for bathing at shower level. Baseline: unable to transfer in/out of shower, completing sponge bath at this time Goal status: IN PROGRESS  3.  Pt will verbalize safe technique for car transfer to facilitate return to driving when medically stable. Baseline: Pt reports difficulty fitting due to body habitus and R leg pain. Goal status: IN PROGRESS  4.  Pt will demonstrate improved dynamic standing balance to allow for increased ease and independence with clothing management post toileting at Mod I level. Baseline: increased difficulty and occasional assist with clothing management. Goal status: IN PROGRESS   ASSESSMENT:  CLINICAL IMPRESSION: Pt receptive to education on energy conservation strategies and breathing techniques.  Pt demonstrating adequate carryover of "milkshake" breathing, however limited due to nasal congestion. Pt will continue to benefit from focus on  breathing strategies, especially during activity.  PERFORMANCE DEFICITS: in functional skills including ADLs, IADLs, strength, pain, balance, body mechanics, endurance, cardiopulmonary status limiting function, and decreased knowledge of use of DME and psychosocial skills including environmental adaptation, habits, and routines and behaviors.   IMPAIRMENTS: are limiting patient from ADLs, IADLs, and social participation.   CO-MORBIDITIES: may have co-morbidities  that affects occupational performance. Patient will benefit from skilled OT to address above impairments and improve overall function.  MODIFICATION OR ASSISTANCE TO COMPLETE EVALUATION: No modification of tasks or assist necessary to complete an evaluation.  OT OCCUPATIONAL PROFILE AND HISTORY: Detailed assessment: Review of records and additional review of physical, cognitive, psychosocial history related to current functional performance.  CLINICAL DECISION MAKING: LOW -  limited treatment options, no task modification necessary  REHAB POTENTIAL: Good  EVALUATION COMPLEXITY: Low    PLAN:  OT FREQUENCY: 1-2x/week (plan to start 2x/week and decrease to 1x/week after ~4 weeks)  OT DURATION: 8 weeks  PLANNED INTERVENTIONS: self care/ADL training, therapeutic exercise, therapeutic activity, balance training, functional mobility training, aquatic therapy, compression bandaging, moist heat, cryotherapy, patient/family education, psychosocial skills training, energy conservation, coping strategies training, and DME and/or AE instructions  RECOMMENDED OTHER SERVICES: N/A  CONSULTED AND AGREED WITH PLAN OF CARE: Patient  PLAN FOR NEXT SESSION: problem solve transfers, standing balance/endurance, review energy conservation strategies, initiate strengthening HEP   Roanne Haye, OTR/L 03/21/2022, 10:04 AM

## 2022-03-21 NOTE — Telephone Encounter (Signed)
Patient called and said she's hurting really bad to the point she's crying. She just ran out of her oxycodone and doesn't see Dr. Marciano Sequin until 03/31/22. She wanted to know if he could do a refill on the oxycodone.

## 2022-03-21 NOTE — Therapy (Signed)
OUTPATIENT PHYSICAL THERAPY NEURO TREATMENT   Patient Name: Alyssa Blanchard MRN: [id number] DOB:March 06, 1971, 52 y.o., female Today's Date: 03/21/2022   PCP: Randel Pigg, Dorma Russell, MD REFERRING PROVIDER: Fanny Dance, MD  END OF SESSION:  PT End of Session - 03/21/22 0800     Visit Number 2    Number of Visits 12    Date for PT Re-Evaluation 04/30/22    Authorization Type [employer] Medicaid    PT Start Time 0800    PT Stop Time 0845    PT Time Calculation (min) 45 min             Past Medical History:  Diagnosis Date   Anxiety    Asthma    Atrial fibrillation (HCC)    Depression    DM (diabetes mellitus), type 2 (HCC) 02/16/2022   Dysrhythmia    new onset Afib rvr   GERD (gastroesophageal reflux disease)    Heart failure (HCC)    Heel spur    bilat   History of bronchitis    History of chicken pox    History of urinary tract infection    Migraines    Plantar fasciitis    bilat   STD (sexually transmitted disease)    chl hx & hsv 1&2   Tremors of nervous system    Urinary incontinence    Past Surgical History:  Procedure Laterality Date   CARDIOVERSION N/A 08/05/2019   Procedure: CARDIOVERSION;  Surgeon: [name] Deloris Ping, MD;  Location: [facility];  Service: Cardiovascular;  Laterality: N/A;   LAPAROSCOPIC GASTRIC SLEEVE RESECTION N/A 11/27/2014   Procedure: LAPAROSCOPIC GASTRIC SLEEVE RESECTION WITH HIATAL HERNIA REPAIR UPPER ENDOSCOPY;  Surgeon: Luretha Murphy, MD;  Location: [facility];  Service: General;  Laterality: N/A;   TEE WITHOUT CARDIOVERSION N/A 08/05/2019   Procedure: TRANSESOPHAGEAL ECHOCARDIOGRAM (TEE);  Surgeon: [name] Deloris Ping, MD;  Location: [facility]COPY;  Service: Cardiovascular;  Laterality: N/A;   TONSILLECTOMY  1978   Patient Active Problem List   Diagnosis Date Noted   Debility 02/28/2022   Prolonged QT interval 02/17/2022   Cellulitis of right leg 02/16/2022   Lumbar radiculopathy, right 02/16/2022   Anemia 02/16/2022   Iron  deficiency anemia 02/02/2022   Type 2 diabetes mellitus without complication, without long-term current use of insulin (HCC) 12/25/2021   Bipolar 1 disorder (HCC) 01/30/2020   Chronic combined systolic and diastolic congestive heart failure (HCC) 09/25/2019   Acute pulmonary edema (HCC)    Paroxysmal atrial fibrillation (HCC) 08/02/2019   Bipolar disorder, in full remission, most recent episode manic (HCC) 04/15/2019   Psychosis (HCC) 04/14/2019   Not well controlled moderate persistent asthma 02/02/2018   Chronic rhinitis 02/02/2018   Status asthmaticus 11/29/2017   Acute respiratory failure with hypoxia (HCC) 11/29/2017   Gastroesophageal reflux disease 09/01/2017   Cough 12/31/2016   Dysuria 07/28/2016   Clinical infection 07/28/2016   History of asthma 04/17/2016   Acute bronchitis 04/17/2016   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   Migraine without aura and without status migrainosus, not intractable 10/10/2014   Migraine with aura and without status migrainosus, not intractable 08/29/2014   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 classified elsewhere 08/29/2014   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   Snorings 07/10/2014   Sleep related headaches 07/10/2014   Nocturia more than twice per night 07/10/2014   Preventative health care 07/30/2012   Class 3 severe obesity due to excess calories with serious comorbidity and body mass index (BMI) greater than  or equal to 42 in adult [facility]) 07/30/2012    ONSET DATE: December  REFERRING DIAG: R53.81 (ICD-10-CM) - Other malaise I50.42 (ICD-10-CM) - Chronic combined systolic (congestive) and diastolic (congestive) heart failure  THERAPY DIAG:  Difficulty in walking, not elsewhere classified  Muscle weakness (generalized)  Other abnormalities of gait and mobility  Unsteadiness on feet  Debility  Rationale for Evaluation and Treatment: Rehabilitation  SUBJECTIVE:                                                                                                                                                                                              SUBJECTIVE STATEMENT: No new issues  Pt accompanied by: self  PERTINENT HISTORY: 52 year old right-handed female with history of chronic anemia, lumbar radiculopathy, fibromyalgia, morbid obesity BMI 86.80 with gastric sleeve 2016 and nonadherence to diet, atrial fibrillation maintained on Eliquis as well as Tikosyn with limited cardiology follow-up Dr. Glorious Peach, bipolar disorder and patient reportedly stopped all psychiatric meds 2/2 secondary to side effects and is not currently seeing a psychiatrist, chronic combined congestive heart failure with ejection fraction of 30 to 35% by TEE 01/31/2020, diabetes mellitus  PAIN:  Are you having pain? Yes: NPRS scale: 6/10 Pain location: right hip-anterior thigh Pain description: aching, burning, stabbing Aggravating factors: prolonged sitting Relieving factors: standing, medication  PRECAUTIONS: Fall  WEIGHT BEARING RESTRICTIONS: No  FALLS: Has patient fallen in last 6 months? Yes. Number of falls 1  LIVING ENVIRONMENT: Lives with: lives with their family, elderly mother Lives in: House/apartment Stairs: Yes: External: 4 steps; on right going up Has following equipment at home: Gilford Rile - 2 wheeled Currently living in dining room/living room and sleeping in hospital bed or recliner  PLOF: Independent with household mobility with device and Independent with community mobility with device  PATIENT GOALS: decr need for AD, be able to complete car transfers, be independent,   OBJECTIVE:   TODAY'S TREATMENT: 03/21/22 Activity Comments  Seated there ex for HEP -AP -LAQ -hip flex  TUG test 27.12 sec  Gait training w/ RW 1x 85 ft, 1x 85 ft -forward/backward w/ NBQC at counter x 2 min -sidestepping x 2 min             HOME EXERCISE PROGRAM: Access Code: [email or web address] URL:  [email or web address] Date: 03/21/2022 Prepared by: Sherlyn Lees  Exercises - Seated Heel Toe Raises  - 1 x daily - 7 x weekly - 3 sets - 10-20 reps - Seated March  - 1 x daily - 7 x weekly - 3 sets - 10-20 reps - Seated Long Arc Quad  - 1 x  daily - 7 x weekly - 3 sets - 10-20 reps  DIAGNOSTIC FINDINGS:   Vitals: 97%, 79 bpm  COGNITION: Overall cognitive status: Within functional limits for tasks assessed   SENSATION: WFL  COORDINATION: WNL  EDEMA:  None present  MUSCLE TONE: WNL  MUSCLE LENGTH:   DTRs:  NT  POSTURE:   LOWER EXTREMITY ROM:    WNK grossly  LOWER EXTREMITY MMT:    MMT Right Eval Left Eval  Hip flexion 3 4  Hip extension    Hip abduction    Hip adduction    Hip internal rotation    Hip external rotation    Knee flexion 4 4  Knee extension 4 4  Ankle dorsiflexion 5 5  Ankle plantarflexion 3 3  Ankle inversion    Ankle eversion    (Blank rows = not tested)  BED MOBILITY:  NT  TRANSFERS: Assistive device utilized: Environmental consultant - 2 wheeled  Sit to stand: Modified independence Stand to sit: Complete Independence Chair to chair: Modified independence Floor:  NT Pt reports she is unable to perform car transfers at this time due to inbility to advance RLE under steering wheel RAMP:  NT  CURB:  Level of Assistance: SBA Assistive device utilized: Environmental consultant - 2 wheeled Curb Comments:   STAIRS: Level of Assistance: SBA Stair Negotiation Technique: Step to Pattern with Bilateral Rails Number of Stairs: 4  Height of Stairs: 4-6  Comments:   GAIT: Gait pattern: WFL Distance walked: 114 Assistive device utilized: Walker - 2 wheeled Level of assistance: Modified independence Comments:   FUNCTIONAL TESTS:  5 times sit to stand: 18.75 2 minute walk test: 114 ft; 93%, 82 bpm   PATIENT SURVEYS:    TODAY'S TREATMENT:                                                                                                                               DATE:     PATIENT EDUCATION: Education details: PT intervention. Initiated home activities with YouTube video search of "Seated exercise for seniors"  Person educated: Patient Education method: Explanation and Demonstration Education comprehension: verbalized understanding   GOALS: Goals reviewed with patient? Yes  SHORT TERM GOALS: Target date: 04/02/2022    Patient will be independent in HEP to improve functional outcomes Baseline: Goal status: IN PROGRESS  2.  Demo improved ambulation tolerance/endurance as evidenced by distance of 150 ft during Baseline: 114 ft w/ RW Goal status: IN PROGRESS    LONG TERM GOALS: Target date: 04/30/2022      Demo ability to ambulate 170 ft during without AD to increase safety with mobility Baseline: 114 ft with RW Goal status: IN PROGRESS  2.  Demo improved BLE strength and balance per time of 13 sec 5xSTS Baseline: 18 sec, 18" seat height Goal status: IN PROGRESS  3.  Demo modified independence for car transfers on driver side to enable return to driving Baseline: unable  Goal status: IN PROGRESS  4. Reveal low risk for falls per time 12 sec TUG test with or without AD  Baseline: 27.12 sec  Goal status: IN PROGRESS  ASSESSMENT:  CLINICAL IMPRESSION: Pt notes no new symptoms or weight gain since last session. Proceeded with TUG test w/ RW and reveals time 27 sec indicating higher risk for falls. HEP initiated with seated LE PRE/AROM due to limited activity tolerance. Gait trials and training to improve endurance/tolerance and progressed with training using NBQC as pt goal is to return to single point cane.  Demo DOE with activity requiring seated rest periods Q 2 min. Vitals monitored throughout and reveal good sat 94% 90 bpm with exertion.  OBJECTIVE IMPAIRMENTS: Abnormal gait, cardiopulmonary status limiting activity, decreased activity tolerance, decreased balance, decreased mobility, difficulty walking,  decreased strength, impaired perceived functional ability, obesity, and pain.   ACTIVITY LIMITATIONS: carrying, lifting, bending, standing, stairs, transfers, and locomotion level  PARTICIPATION LIMITATIONS: meal prep, cleaning, laundry, interpersonal relationship, driving, shopping, and community activity  PERSONAL FACTORS: Past/current experiences, Time since onset of injury/illness/exacerbation, and 3+ comorbidities: CHF, obesity, A-fib, Bipolar  are also affecting patient's functional outcome.   REHAB POTENTIAL: Good  CLINICAL DECISION MAKING: Evolving/moderate complexity  EVALUATION COMPLEXITY: Moderate  PLAN:  PT FREQUENCY: 1-2x/week  PT DURATION: 6 weeks  PLANNED INTERVENTIONS: Therapeutic exercises, Therapeutic activity, Neuromuscular re-education, Balance training, Gait training, Patient/Family education, Self Care, Joint mobilization, Stair training, Vestibular training, Canalith repositioning, Orthotic/Fit training, DME instructions, Aquatic Therapy, Dry Needling, Electrical stimulation, Spinal mobilization, Cryotherapy, Moist heat, Taping, Ionotophoresis 4mg /ml Dexamethasone, and Manual therapy  PLAN FOR NEXT SESSION: HEP review   8:02 AM, 03/21/22 M. Sherlyn Lees, PT, DPT Physical Therapist- [place] Office Number: [phone]

## 2022-03-21 NOTE — Telephone Encounter (Signed)
Called her back about her severe pain. She is continuing to have severe pain in her right thigh. She ran out of oxycodone several days ago. She is having severe pain with therapy outpatient at [place].  Pain is making it difficulty for her to complete her therapy.  Will order 5 more day oxycodone 5mg  O7P prn #[id number], she has follow up with me later this month.

## 2022-03-23 ENCOUNTER — Telehealth: Payer: Medicaid Other | Admitting: Nurse Practitioner

## 2022-03-23 DIAGNOSIS — J069 Acute upper respiratory infection, unspecified: Secondary | ICD-10-CM | POA: Diagnosis not present

## 2022-03-23 NOTE — Progress Notes (Signed)
Virtual Visit Consent   Alyssa Blanchard, you are scheduled for a virtual visit with a [facility] provider today. Just as with appointments in the office, your consent must be obtained to participate. Your consent will be active for this visit and any virtual visit you may have with one of our providers in the next 365 days. If you have a MyChart account, a copy of this consent can be sent to you electronically.  As this is a virtual visit, video technology does not allow for your provider to perform a traditional examination. This may limit your provider's ability to fully assess your condition. If your provider identifies any concerns that need to be evaluated in person or the need to arrange testing (such as labs, EKG, etc.), we will make arrangements to do so. Although advances in technology are sophisticated, we cannot ensure that it will always work on either your end or our end. If the connection with a video visit is poor, the visit may have to be switched to a telephone visit. With either a video or telephone visit, we are not always able to ensure that we have a secure connection.  By engaging in this virtual visit, you consent to the provision of healthcare and authorize for your insurance to be billed (if applicable) for the services provided during this visit. Depending on your insurance coverage, you may receive a charge related to this service.  I need to obtain your verbal consent now. Are you willing to proceed with your visit today? Alyssa Blanchard has provided verbal consent on 03/23/2022 for a virtual visit (video or telephone). Gildardo Pounds, NP  Date: 03/23/2022 6:02 PM  Virtual Visit via Video Note   I, Gildardo Pounds, connected with  Alyssa Blanchard  ([id number], [birth year]) on 03/23/22 at  5:45 PM EST by a video-enabled telemedicine application and verified that I am speaking with the correct person using two identifiers.  Location: Patient: Virtual Visit Location  Patient: Home Provider: Virtual Visit Location Provider: Home Office   I discussed the limitations of evaluation and management by telemedicine and the availability of in person appointments. The patient expressed understanding and agreed to proceed.    History of Present Illness: Alyssa Blanchard is a 52 y.o. who identifies as a female who was assigned female at birth, and is being seen today for worsening URI symptoms.  Seen at urgent care 03-21-2022 and at that time she has complaints of shortness of breath, sinus congestion, sinus pain and pressure for 4 days with negative home COVID test. She declined the PCR COVID test at the urgent care. Was prescrdibed steroid nasal spray, doxycycline and tessalon.  Today she reports worsening symptoms of shortness of breath, fever, sats as low as 87% on RA with activity. States she can not take prednisone due to "my heart conditions". She has a history of afib and CHF and states prednisone worsens her symptoms. Temp 99.5 currently.   Problems:  Patient Active Problem List   Diagnosis Date Noted   Debility 02/28/2022   Prolonged QT interval 02/17/2022   Cellulitis of right leg 02/16/2022   Lumbar radiculopathy, right 02/16/2022   Anemia 02/16/2022   Iron deficiency anemia 02/02/2022   Type 2 diabetes mellitus without complication, without long-term current use of insulin ([place]) 12/25/2021   Bipolar 1 disorder ([place]) 01/30/2020   Chronic combined systolic and diastolic congestive heart failure ([place]) 09/25/2019   Acute pulmonary edema (HCC)    Paroxysmal atrial  fibrillation (HCC) 08/02/2019   Bipolar disorder, in full remission, most recent episode manic (HCC) 04/15/2019   Psychosis (HCC) 04/14/2019   Not well controlled moderate persistent asthma 02/02/2018   Chronic rhinitis 02/02/2018   Status asthmaticus 11/29/2017   Acute respiratory failure with hypoxia (HCC) 11/29/2017   Gastroesophageal reflux disease 09/01/2017   Cough 12/31/2016    Dysuria 07/28/2016   Clinical infection 07/28/2016   History of asthma 04/17/2016   Acute bronchitis 04/17/2016   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   Migraine without aura and without status migrainosus, not intractable 10/10/2014   Migraine with aura and without status migrainosus, not intractable 08/29/2014   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 classified elsewhere 08/29/2014   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   Snorings 07/10/2014   Sleep related headaches 07/10/2014   Nocturia more than twice per night 07/10/2014   Preventative health care 07/30/2012   Class 3 severe obesity due to excess calories with serious comorbidity and body mass index (BMI) greater than or equal to 70 in adult (HCC) 07/30/2012    Allergies:  Allergies  Allergen Reactions   Gabapentin Anaphylaxis   Topiramate Other (See Comments)    Jaw tightness and chest discomfort    Advair Diskus [Fluticasone-Salmeterol]    Hydroxyzine     QT prolongation related   Imitrex [Sumatriptan] Other (See Comments)    Increased heart rate   Latex Itching   Montelukast Other (See Comments)    Nightmares    Other Itching and Other (See Comments)    Pt states "no narcotics ever" 04/13/2019- denies this entry in 01/2020 "Mental health meds, in general, don't agree with me" Plastic and artificial Christmas trees = Itching   Prozac [Fluoxetine] Other (See Comments)    Bad, vivid dreams   Trazodone And Nefazodone Other (See Comments)    Bad dreams 01/27/20 Pt is unsure if allergic to Nefazodone   Copper-Containing Compounds Rash and Other (See Comments)    Makes the face burn, also   Medications:  Current Outpatient Medications:    acetaminophen (TYLENOL) 325 MG tablet, Take 1-2 tablets (325-650 mg total) by mouth every 4 (four) hours as needed for mild pain., Disp: , Rfl:    albuterol (VENTOLIN HFA) 108 (90 Base) MCG/ACT inhaler, Inhale 2 puffs into the lungs every 6 (six) hours as  needed for wheezing or shortness of breath. (Patient not taking: Reported on 03/19/2022), Disp: 18 g, Rfl: 12   apixaban (ELIQUIS) 5 MG TABS tablet, Take 1 tablet (5 mg total) by mouth 2 (two) times daily., Disp: 60 tablet, Rfl: 0   bumetanide (BUMEX) 1 MG tablet, Take 1 tablet (1 mg total) by mouth daily., Disp: 30 tablet, Rfl: 0   busPIRone (BUSPAR) 10 MG tablet, Take 1 tablet (10 mg total) by mouth 2 (two) times daily., Disp: 60 tablet, Rfl: 0   dofetilide (TIKOSYN) 500 MCG capsule, Take 1 capsule (500 mcg total) by mouth 2 (two) times daily., Disp: 60 capsule, Rfl: 0   Dupilumab (DUPIXENT) 300 MG/2ML SOPN, Inject 300 mg into the skin every 14 (fourteen) days., Disp: , Rfl:    fluticasone furoate-vilanterol (BREO ELLIPTA) 200-25 MCG/ACT AEPB, Inhale 1 puff into the lungs daily., Disp: 60 each, Rfl: 0   glipiZIDE (GLUCOTROL) 10 MG tablet, Take 10 mg by mouth 2 (two) times daily before a meal., Disp: , Rfl:    magnesium oxide (MAG-OX) 400 MG tablet, Take 1 tablet (400 mg total) by mouth daily., Disp: 30 tablet, Rfl: 0  melatonin 5 MG TABS, Take 2 tablets (10 mg total) by mouth daily., Disp: 30 tablet, Rfl: 0   Multiple Vitamins-Minerals (BARIATRIC MULTIVITAMINS/IRON PO), Take 1 tablet by mouth daily., Disp: , Rfl:    nystatin (MYCOSTATIN/NYSTOP) powder, Apply topically 2 (two) times daily. (Patient taking differently: Apply 1 Application topically 2 (two) times daily as needed (skin irritation).), Disp: 15 g, Rfl: 0   oxyCODONE (OXY IR/ROXICODONE) 5 MG immediate release tablet, Take 1 tablet (5 mg total) by mouth every 4 (four) hours as needed for breakthrough pain., Disp: 30 tablet, Rfl: 0   pantoprazole (PROTONIX) 40 MG tablet, Take 1 tablet (40 mg total) by mouth 2 (two) times daily., Disp: 60 tablet, Rfl: 0   polyethylene glycol powder (GLYCOLAX/MIRALAX) 17 GM/SCOOP powder, Take 17 g by mouth daily as needed for mild constipation., Disp: , Rfl:    potassium chloride SA (KLOR-CON M) 20 MEQ tablet,  Take 1 tablet (20 mEq total) by mouth daily., Disp: , Rfl:    pregabalin (LYRICA) 100 MG capsule, Take 1 capsule (100 mg total) by mouth 2 (two) times daily., Disp: 60 capsule, Rfl: 0   propranolol (INDERAL) 60 MG tablet, Take 1 tablet (60 mg total) by mouth 3 (three) times daily., Disp: 90 tablet, Rfl: 0   scopolamine (TRANSDERM-SCOP) 1 MG/3DAYS, Place 1 patch (1.5 mg total) onto the skin every 3 (three) days., Disp: 10 patch, Rfl: 0   Semaglutide,0.25 or 0.5MG /DOS, (OZEMPIC, 0.25 OR 0.5 MG/DOSE,) 2 MG/3ML SOPN, Inject 0.25 mg into the skin once a week. Wednesday ( 02-19-22) is the last dose will increase to 0.5 mg weekly., Disp: , Rfl:    silver sulfADIAZINE (SILVADENE) 1 % cream, Apply 1 Application topically daily. Apply to affected area, Disp: 85 g, Rfl: 0   spironolactone (ALDACTONE) 25 MG tablet, Take 1 tablet (25 mg total) by mouth daily., Disp: 30 tablet, Rfl: 0   Vitamin D, Ergocalciferol, (DRISDOL) 1.25 MG (50000 UNIT) CAPS capsule, Take 1 capsule (50,000 Units total) by mouth once a week. Monday, Disp: 5 capsule, Rfl: 0   VOLTAREN ARTHRITIS PAIN 1 % GEL, Apply 2 g topically 4 (four) times daily as needed (knee pain)., Disp: , Rfl:  No current facility-administered medications for this visit.  Facility-Administered Medications Ordered in Other Visits:    acetaminophen (TYLENOL) tablet 650 mg, 650 mg, Oral, Once, Thayil, Irene T, PA-C   ferric derisomaltose (MONOFERRIC) 1,000 mg in sodium chloride 0.9 % 100 mL infusion, 1,000 mg, Intravenous, Once, Thayil, Irene T, PA-C  Observations/Objective: Patient is morbidly obese with shortness of breath. Difficult to speak complete symptoms. Head is normocephalic, atraumatic.  Labored breathing.  Speech is clear and coherent with logical content.  Patient is alert and oriented at baseline.    Assessment and Plan: 1. URI with cough and congestion CALL 911 She states she can not afford the bill for 911. Does not drive and states she will  "try" to see if she can get a ride to the emergency room  I  gave her the address for [facility]  Follow Up Instructions: I discussed the assessment and treatment plan with the patient. The patient was provided an opportunity to ask questions and all were answered. The patient agreed with the plan and demonstrated an understanding of the instructions.  A copy of instructions were sent to the patient via MyChart unless otherwise noted below.    The patient was advised to call back or seek an in-person evaluation if the symptoms worsen or  if the condition fails to improve as anticipated.  Time:  I spent 12 minutes with the patient via telehealth technology discussing the above problems/concerns.    Gildardo Pounds, NP

## 2022-03-23 NOTE — Patient Instructions (Signed)
[employer], thank you for joining Alyssa Pounds, NP for today's virtual visit.  While this provider is not your primary care provider (PCP), if your PCP is located in our provider database this encounter information will be shared with them immediately following your visit.   [place] account gives you access to today's visit and all your visits, tests, and labs performed at [facility] " click here if you don't have a [facility] account or go to [email or web address]  Consent: (Patient) Alyssa Blanchard provided verbal consent for this virtual visit at the beginning of the encounter.  Current Medications:  Current Outpatient Medications:    acetaminophen (TYLENOL) 325 MG tablet, Take 1-2 tablets (325-650 mg total) by mouth every 4 (four) hours as needed for mild pain., Disp: , Rfl:    albuterol (VENTOLIN HFA) 108 (90 Base) MCG/ACT inhaler, Inhale 2 puffs into the lungs every 6 (six) hours as needed for wheezing or shortness of breath. (Patient not taking: Reported on 03/19/2022), Disp: 18 g, Rfl: 12   apixaban (ELIQUIS) 5 MG TABS tablet, Take 1 tablet (5 mg total) by mouth 2 (two) times daily., Disp: 60 tablet, Rfl: 0   bumetanide (BUMEX) 1 MG tablet, Take 1 tablet (1 mg total) by mouth daily., Disp: 30 tablet, Rfl: 0   busPIRone (BUSPAR) 10 MG tablet, Take 1 tablet (10 mg total) by mouth 2 (two) times daily., Disp: 60 tablet, Rfl: 0   dofetilide (TIKOSYN) 500 MCG capsule, Take 1 capsule (500 mcg total) by mouth 2 (two) times daily., Disp: 60 capsule, Rfl: 0   Dupilumab (DUPIXENT) 300 MG/2ML SOPN, Inject 300 mg into the skin every 14 (fourteen) days., Disp: , Rfl:    fluticasone furoate-vilanterol (BREO ELLIPTA) 200-25 MCG/ACT AEPB, Inhale 1 puff into the lungs daily., Disp: 60 each, Rfl: 0   glipiZIDE (GLUCOTROL) 10 MG tablet, Take 10 mg by mouth 2 (two) times daily before a meal., Disp: , Rfl:    magnesium oxide (MAG-OX) 400 MG tablet, Take 1  tablet (400 mg total) by mouth daily., Disp: 30 tablet, Rfl: 0   melatonin 5 MG TABS, Take 2 tablets (10 mg total) by mouth daily., Disp: 30 tablet, Rfl: 0   Multiple Vitamins-Minerals (BARIATRIC MULTIVITAMINS/IRON PO), Take 1 tablet by mouth daily., Disp: , Rfl:    nystatin (MYCOSTATIN/NYSTOP) powder, Apply topically 2 (two) times daily. (Patient taking differently: Apply 1 Application topically 2 (two) times daily as needed (skin irritation).), Disp: 15 g, Rfl: 0   oxyCODONE (OXY IR/ROXICODONE) 5 MG immediate release tablet, Take 1 tablet (5 mg total) by mouth every 4 (four) hours as needed for breakthrough pain., Disp: 30 tablet, Rfl: 0   pantoprazole (PROTONIX) 40 MG tablet, Take 1 tablet (40 mg total) by mouth 2 (two) times daily., Disp: 60 tablet, Rfl: 0   polyethylene glycol powder (GLYCOLAX/MIRALAX) 17 GM/SCOOP powder, Take 17 g by mouth daily as needed for mild constipation., Disp: , Rfl:    potassium chloride SA (KLOR-CON M) 20 MEQ tablet, Take 1 tablet (20 mEq total) by mouth daily., Disp: , Rfl:    pregabalin (LYRICA) 100 MG capsule, Take 1 capsule (100 mg total) by mouth 2 (two) times daily., Disp: 60 capsule, Rfl: 0   propranolol (INDERAL) 60 MG tablet, Take 1 tablet (60 mg total) by mouth 3 (three) times daily., Disp: 90 tablet, Rfl: 0   scopolamine (TRANSDERM-SCOP) 1 MG/3DAYS, Place 1 patch (1.5 mg total) onto the skin every 3 (three)  days., Disp: 10 patch, Rfl: 0   Semaglutide,0.25 or 0.5MG /DOS, (OZEMPIC, 0.25 OR 0.5 MG/DOSE,) 2 MG/3ML SOPN, Inject 0.25 mg into the skin once a week. Wednesday ( 02-19-22) is the last dose will increase to 0.5 mg weekly., Disp: , Rfl:    silver sulfADIAZINE (SILVADENE) 1 % cream, Apply 1 Application topically daily. Apply to affected area, Disp: 85 g, Rfl: 0   spironolactone (ALDACTONE) 25 MG tablet, Take 1 tablet (25 mg total) by mouth daily., Disp: 30 tablet, Rfl: 0   Vitamin D, Ergocalciferol, (DRISDOL) 1.25 MG (50000 UNIT) CAPS capsule, Take 1  capsule (50,000 Units total) by mouth once a week. Monday, Disp: 5 capsule, Rfl: 0   VOLTAREN ARTHRITIS PAIN 1 % GEL, Apply 2 g topically 4 (four) times daily as needed (knee pain)., Disp: , Rfl:  No current facility-administered medications for this visit.  Facility-Administered Medications Ordered in Other Visits:    acetaminophen (TYLENOL) tablet 650 mg, 650 mg, Oral, Once, Thayil, Irene T, PA-C   ferric derisomaltose (MONOFERRIC) 1,000 mg in sodium chloride 0.9 % 100 mL infusion, 1,000 mg, Intravenous, Once, Thayil, Irene T, PA-C   Medications ordered in this encounter:  No orders of the defined types were placed in this encounter.    *If you need refills on other medications prior to your next appointment, please contact your pharmacy*  Follow-Up: Call back or seek an in-person evaluation if the symptoms worsen or if the condition fails to improve as anticipated.  [place] [phone]  Other Instructions CALL 911 She states she can not afford the bill for 911. Does not drive and states she will "try" to see if she can get a ride to the emergency room  I  gave her the address for [facility]   If you have been instructed to have an in-person evaluation today at a local Urgent Care facility, please use the link below. It will take you to a list of all of our available [facility], including address, phone number and hours of operation. Please do not delay care.  [facility]  If you or a family member do not have a primary care provider, use the link below to schedule a visit and establish care. When you choose a [place] primary care physician or advanced practice provider, you gain a long-term partner in health. Find a Primary Care Provider  Learn more about [facility]'s in-office and virtual care options: [facility]

## 2022-03-24 ENCOUNTER — Encounter: Payer: Self-pay | Admitting: Physician Assistant

## 2022-03-25 ENCOUNTER — Encounter: Payer: Medicaid Other | Admitting: Occupational Therapy

## 2022-03-25 ENCOUNTER — Emergency Department (HOSPITAL_COMMUNITY): Payer: Medicaid Other

## 2022-03-25 ENCOUNTER — Inpatient Hospital Stay (HOSPITAL_COMMUNITY)
Admission: EM | Admit: 2022-03-25 | Discharge: 2022-03-28 | DRG: 189 | Disposition: A | Discharge status: Home / Self Care | Payer: Medicaid Other | Attending: Internal Medicine | Admitting: Internal Medicine

## 2022-03-25 ENCOUNTER — Other Ambulatory Visit: Payer: Self-pay

## 2022-03-25 ENCOUNTER — Encounter (HOSPITAL_COMMUNITY): Payer: Self-pay | Admitting: Emergency Medicine

## 2022-03-25 ENCOUNTER — Ambulatory Visit: Payer: Medicaid Other | Admitting: Physical Therapy

## 2022-03-25 DIAGNOSIS — Z1152 Encounter for screening for COVID-19: Secondary | ICD-10-CM

## 2022-03-25 DIAGNOSIS — J4541 Moderate persistent asthma with (acute) exacerbation: Secondary | ICD-10-CM | POA: Diagnosis present

## 2022-03-25 DIAGNOSIS — E1165 Type 2 diabetes mellitus with hyperglycemia: Secondary | ICD-10-CM | POA: Diagnosis present

## 2022-03-25 DIAGNOSIS — Z6841 Body Mass Index (BMI) 40.0 and over, adult: Secondary | ICD-10-CM | POA: Diagnosis not present

## 2022-03-25 DIAGNOSIS — R0602 Shortness of breath: Secondary | ICD-10-CM | POA: Diagnosis present

## 2022-03-25 DIAGNOSIS — Z833 Family history of diabetes mellitus: Secondary | ICD-10-CM

## 2022-03-25 DIAGNOSIS — J45901 Unspecified asthma with (acute) exacerbation: Secondary | ICD-10-CM

## 2022-03-25 DIAGNOSIS — I480 Paroxysmal atrial fibrillation: Secondary | ICD-10-CM | POA: Diagnosis present

## 2022-03-25 DIAGNOSIS — E1140 Type 2 diabetes mellitus with diabetic neuropathy, unspecified: Secondary | ICD-10-CM | POA: Diagnosis present

## 2022-03-25 DIAGNOSIS — E662 Morbid (severe) obesity with alveolar hypoventilation: Secondary | ICD-10-CM | POA: Diagnosis present

## 2022-03-25 DIAGNOSIS — Z8249 Family history of ischemic heart disease and other diseases of the circulatory system: Secondary | ICD-10-CM

## 2022-03-25 DIAGNOSIS — L309 Dermatitis, unspecified: Secondary | ICD-10-CM | POA: Diagnosis present

## 2022-03-25 DIAGNOSIS — Z7984 Long term (current) use of oral hypoglycemic drugs: Secondary | ICD-10-CM | POA: Diagnosis not present

## 2022-03-25 DIAGNOSIS — J159 Unspecified bacterial pneumonia: Secondary | ICD-10-CM | POA: Diagnosis present

## 2022-03-25 DIAGNOSIS — Z808 Family history of malignant neoplasm of other organs or systems: Secondary | ICD-10-CM | POA: Diagnosis not present

## 2022-03-25 DIAGNOSIS — Z9884 Bariatric surgery status: Secondary | ICD-10-CM

## 2022-03-25 DIAGNOSIS — Z7951 Long term (current) use of inhaled steroids: Secondary | ICD-10-CM

## 2022-03-25 DIAGNOSIS — Z83438 Family history of other disorder of lipoprotein metabolism and other lipidemia: Secondary | ICD-10-CM

## 2022-03-25 DIAGNOSIS — Z7901 Long term (current) use of anticoagulants: Secondary | ICD-10-CM | POA: Diagnosis not present

## 2022-03-25 DIAGNOSIS — Z820 Family history of epilepsy and other diseases of the nervous system: Secondary | ICD-10-CM | POA: Diagnosis not present

## 2022-03-25 DIAGNOSIS — Z7985 Long-term (current) use of injectable non-insulin antidiabetic drugs: Secondary | ICD-10-CM

## 2022-03-25 DIAGNOSIS — I5032 Chronic diastolic (congestive) heart failure: Secondary | ICD-10-CM | POA: Diagnosis present

## 2022-03-25 DIAGNOSIS — J9601 Acute respiratory failure with hypoxia: Secondary | ICD-10-CM | POA: Diagnosis present

## 2022-03-25 DIAGNOSIS — B974 Respiratory syncytial virus as the cause of diseases classified elsewhere: Secondary | ICD-10-CM | POA: Diagnosis present

## 2022-03-25 DIAGNOSIS — Z87891 Personal history of nicotine dependence: Secondary | ICD-10-CM | POA: Diagnosis not present

## 2022-03-25 DIAGNOSIS — Z841 Family history of disorders of kidney and ureter: Secondary | ICD-10-CM

## 2022-03-25 DIAGNOSIS — B338 Other specified viral diseases: Secondary | ICD-10-CM

## 2022-03-25 LAB — BASIC METABOLIC PANEL
Anion gap: 8 (ref 5–15)
BUN: 8 mg/dL (ref 6–20)
CO2: 24 mmol/L (ref 22–32)
Calcium: 8.6 mg/dL — ABNORMAL LOW (ref 8.9–10.3)
Chloride: 103 mmol/L (ref 98–111)
Creatinine, Ser: 0.91 mg/dL (ref 0.44–1.00)
GFR, Estimated: >60 mL/min (ref >60)
Glucose, Bld: 148 mg/dL — ABNORMAL HIGH (ref 70–99)
Potassium: 3.7 mmol/L (ref 3.5–5.1)
Sodium: 135 mmol/L (ref 135–145)

## 2022-03-25 LAB — TROPONIN I (HIGH SENSITIVITY)
Troponin I (High Sensitivity): 4 ng/L (ref <18)
Troponin I (High Sensitivity): 5 ng/L (ref <18)

## 2022-03-25 LAB — BLOOD GAS, VENOUS
Acid-Base Excess: 3.4 mmol/L — ABNORMAL HIGH (ref 0.0–2.0)
Bicarbonate: 29.6 mmol/L — ABNORMAL HIGH (ref 20.0–28.0)
O2 Saturation: 96.8 %
Patient temperature: 37.1
pCO2, Ven: 50 mmHg (ref 44–60)
pH, Ven: 7.38 (ref 7.25–7.43)
pO2, Ven: 72 mmHg — ABNORMAL HIGH (ref 32–45)

## 2022-03-25 LAB — CBC
HCT: 38.7 % (ref 36.0–46.0)
Hemoglobin: 11.4 g/dL — ABNORMAL LOW (ref 12.0–15.0)
MCH: 26.8 pg (ref 26.0–34.0)
MCHC: 29.5 g/dL — ABNORMAL LOW (ref 30.0–36.0)
MCV: 90.8 fL (ref 80.0–100.0)
Platelets: 179 10*3/uL (ref 150–400)
RBC: 4.26 MIL/uL (ref 3.87–5.11)
RDW: 17.8 % — ABNORMAL HIGH (ref 11.5–15.5)
WBC: 7.7 10*3/uL (ref 4.0–10.5)
nRBC: 0 % (ref 0.0–0.2)

## 2022-03-25 LAB — BLOOD GAS, ARTERIAL
Acid-Base Excess: 0 mmol/L (ref 0.0–2.0)
Bicarbonate: 25.4 mmol/L (ref 20.0–28.0)
Drawn by: 25770
O2 Content: 4 L/min
O2 Saturation: 96.3 %
Patient temperature: 36.4
pCO2 arterial: 42 mmHg (ref 32–48)
pH, Arterial: 7.39 (ref 7.35–7.45)
pO2, Arterial: 71 mmHg — ABNORMAL LOW (ref 83–108)

## 2022-03-25 LAB — PROCALCITONIN: Procalcitonin: 0.14 ng/mL

## 2022-03-25 LAB — CBC WITH DIFFERENTIAL/PLATELET
Abs Immature Granulocytes: 0.06 10*3/uL (ref 0.00–0.07)
Basophils Absolute: 0 10*3/uL (ref 0.0–0.1)
Basophils Relative: 0 %
Eosinophils Absolute: 0.2 10*3/uL (ref 0.0–0.5)
Eosinophils Relative: 3 %
HCT: 37.1 % (ref 36.0–46.0)
Hemoglobin: 10.8 g/dL — ABNORMAL LOW (ref 12.0–15.0)
Immature Granulocytes: 1 %
Lymphocytes Relative: 13 %
Lymphs Abs: 1 10*3/uL (ref 0.7–4.0)
MCH: 26.8 pg (ref 26.0–34.0)
MCHC: 29.1 g/dL — ABNORMAL LOW (ref 30.0–36.0)
MCV: 92.1 fL (ref 80.0–100.0)
Monocytes Absolute: 0.9 10*3/uL (ref 0.1–1.0)
Monocytes Relative: 12 %
Neutro Abs: 5.3 10*3/uL (ref 1.7–7.7)
Neutrophils Relative %: 71 %
Platelets: 177 10*3/uL (ref 150–400)
RBC: 4.03 MIL/uL (ref 3.87–5.11)
RDW: 17.8 % — ABNORMAL HIGH (ref 11.5–15.5)
WBC: 7.5 10*3/uL (ref 4.0–10.5)
nRBC: 0 % (ref 0.0–0.2)

## 2022-03-25 LAB — HEMOGLOBIN A1C
Hgb A1c MFr Bld: 5.5 % (ref 4.8–5.6)
Mean Plasma Glucose: 111.15 mg/dL

## 2022-03-25 LAB — EXPECTORATED SPUTUM ASSESSMENT W GRAM STAIN, RFLX TO RESP C

## 2022-03-25 LAB — C-REACTIVE PROTEIN: CRP: 2.5 mg/dL — ABNORMAL HIGH (ref <1.0)

## 2022-03-25 LAB — CBG MONITORING, ED: Glucose-Capillary: 201 mg/dL — ABNORMAL HIGH (ref 70–99)

## 2022-03-25 LAB — PHOSPHORUS: Phosphorus: 4.5 mg/dL (ref 2.5–4.6)

## 2022-03-25 LAB — GLUCOSE, CAPILLARY
Glucose-Capillary: 173 mg/dL — ABNORMAL HIGH (ref 70–99)
Glucose-Capillary: 198 mg/dL — ABNORMAL HIGH (ref 70–99)
Glucose-Capillary: 216 mg/dL — ABNORMAL HIGH (ref 70–99)

## 2022-03-25 LAB — D-DIMER, QUANTITATIVE: D-Dimer, Quant: 0.57 ug/mL-FEU — ABNORMAL HIGH (ref 0.00–0.50)

## 2022-03-25 LAB — BRAIN NATRIURETIC PEPTIDE: B Natriuretic Peptide: 104.6 pg/mL — ABNORMAL HIGH (ref 0.0–100.0)

## 2022-03-25 LAB — MAGNESIUM: Magnesium: 2.2 mg/dL (ref 1.7–2.4)

## 2022-03-25 LAB — STREP PNEUMONIAE URINARY ANTIGEN: Strep Pneumo Urinary Antigen: NEGATIVE

## 2022-03-25 LAB — CREATININE, SERUM
Creatinine, Ser: 0.89 mg/dL (ref 0.44–1.00)
GFR, Estimated: >60 mL/min (ref >60)

## 2022-03-25 LAB — RESP PANEL BY RT-PCR (RSV, FLU A&B, COVID)  RVPGX2
Influenza A by PCR: NEGATIVE
Influenza B by PCR: NEGATIVE
Resp Syncytial Virus by PCR: POSITIVE — AB
SARS Coronavirus 2 by RT PCR: NEGATIVE

## 2022-03-25 MED ORDER — ACETAMINOPHEN 650 MG RE SUPP: 650.0000 mg | Freq: Four times a day (QID) | RECTAL | Status: DC | PRN

## 2022-03-25 MED ORDER — SODIUM CHLORIDE 0.9 % IV SOLN
500.0000 mg | Freq: Every day | INTRAVENOUS | Status: DC
  Administered 2022-03-25 – 2022-03-28 (×4): 500 mg via INTRAVENOUS
  Filled 2022-03-25 (×4): qty 5

## 2022-03-25 MED ORDER — HYDROCOD POLI-CHLORPHE POLI ER 10-8 MG/5ML PO SUER
5.0000 mL | Freq: Two times a day (BID) | ORAL | Status: DC | PRN
  Administered 2022-03-25 – 2022-03-27 (×3): 5 mL via ORAL
  Filled 2022-03-25 (×3): qty 5

## 2022-03-25 MED ORDER — POTASSIUM CHLORIDE CRYS ER 20 MEQ PO TBCR
20.0000 meq | EXTENDED_RELEASE_TABLET | Freq: Every day | ORAL | Status: DC
  Administered 2022-03-26 – 2022-03-28 (×3): 20 meq via ORAL
  Filled 2022-03-25 (×3): qty 1

## 2022-03-25 MED ORDER — PROPRANOLOL HCL 20 MG PO TABS
60.0000 mg | ORAL_TABLET | Freq: Three times a day (TID) | ORAL | Status: DC
  Administered 2022-03-25 – 2022-03-28 (×9): 60 mg via ORAL
  Filled 2022-03-25 (×9): qty 3

## 2022-03-25 MED ORDER — CHLORHEXIDINE GLUCONATE CLOTH 2 % EX PADS
6.0000 | MEDICATED_PAD | Freq: Every day | CUTANEOUS | Status: DC
  Administered 2022-03-25 – 2022-03-28 (×3): 6 via TOPICAL

## 2022-03-25 MED ORDER — OXYCODONE HCL 5 MG PO TABS
5.0000 mg | ORAL_TABLET | ORAL | Status: DC | PRN
  Administered 2022-03-25 – 2022-03-28 (×8): 5 mg via ORAL
  Filled 2022-03-25 (×9): qty 1

## 2022-03-25 MED ORDER — BUSPIRONE HCL 5 MG PO TABS
10.0000 mg | ORAL_TABLET | Freq: Two times a day (BID) | ORAL | Status: DC
  Administered 2022-03-26 – 2022-03-28 (×5): 10 mg via ORAL
  Filled 2022-03-25: qty 2
  Filled 2022-03-25: qty 1
  Filled 2022-03-25 (×3): qty 2

## 2022-03-25 MED ORDER — PANTOPRAZOLE SODIUM 40 MG PO TBEC
40.0000 mg | DELAYED_RELEASE_TABLET | Freq: Two times a day (BID) | ORAL | Status: DC
  Administered 2022-03-25 – 2022-03-28 (×6): 40 mg via ORAL
  Filled 2022-03-25 (×6): qty 1

## 2022-03-25 MED ORDER — FENTANYL CITRATE PF 50 MCG/ML IJ SOSY
25.0000 ug | PREFILLED_SYRINGE | INTRAMUSCULAR | Status: DC | PRN
  Administered 2022-03-26: 25 ug via INTRAVENOUS
  Filled 2022-03-25: qty 1

## 2022-03-25 MED ORDER — LEVALBUTEROL HCL 1.25 MG/0.5ML IN NEBU
1.2500 mg | INHALATION_SOLUTION | Freq: Four times a day (QID) | RESPIRATORY_TRACT | Status: DC
  Administered 2022-03-25 – 2022-03-27 (×9): 1.25 mg via RESPIRATORY_TRACT
  Filled 2022-03-25 (×9): qty 0.5

## 2022-03-25 MED ORDER — METHYLPREDNISOLONE SODIUM SUCC 125 MG IJ SOLR
80.0000 mg | Freq: Two times a day (BID) | INTRAMUSCULAR | Status: DC
  Administered 2022-03-25 (×2): 80 mg via INTRAVENOUS
  Filled 2022-03-25 (×2): qty 2

## 2022-03-25 MED ORDER — SODIUM CHLORIDE 0.9 % IV SOLN
2.0000 g | Freq: Every day | INTRAVENOUS | Status: DC
  Administered 2022-03-25: 2 g via INTRAVENOUS
  Filled 2022-03-25: qty 20

## 2022-03-25 MED ORDER — LEVALBUTEROL HCL 1.25 MG/0.5ML IN NEBU
1.2500 mg | INHALATION_SOLUTION | RESPIRATORY_TRACT | Status: DC | PRN
  Filled 2022-03-25: qty 0.5

## 2022-03-25 MED ORDER — INSULIN ASPART 100 UNIT/ML IJ SOLN
0.0000 [IU] | INTRAMUSCULAR | Status: DC
  Administered 2022-03-25: 7 [IU] via SUBCUTANEOUS
  Administered 2022-03-25: 4 [IU] via SUBCUTANEOUS
  Administered 2022-03-25 – 2022-03-26 (×2): 7 [IU] via SUBCUTANEOUS
  Administered 2022-03-26: 4 [IU] via SUBCUTANEOUS
  Filled 2022-03-25: qty 0.2

## 2022-03-25 MED ORDER — PREGABALIN 100 MG PO CAPS
100.0000 mg | ORAL_CAPSULE | Freq: Two times a day (BID) | ORAL | Status: DC
  Administered 2022-03-26 – 2022-03-28 (×5): 100 mg via ORAL
  Filled 2022-03-25 (×5): qty 1

## 2022-03-25 MED ORDER — SODIUM CHLORIDE 0.9% FLUSH
3.0000 mL | Freq: Two times a day (BID) | INTRAVENOUS | Status: DC
  Administered 2022-03-25 – 2022-03-28 (×7): 3 mL via INTRAVENOUS

## 2022-03-25 MED ORDER — BUMETANIDE 1 MG PO TABS
1.0000 mg | ORAL_TABLET | Freq: Every day | ORAL | Status: DC
  Administered 2022-03-26 – 2022-03-28 (×3): 1 mg via ORAL
  Filled 2022-03-25 (×3): qty 1

## 2022-03-25 MED ORDER — LEVALBUTEROL HCL 1.25 MG/0.5ML IN NEBU
1.2500 mg | INHALATION_SOLUTION | Freq: Once | RESPIRATORY_TRACT | Status: AC
  Administered 2022-03-25: 1.25 mg via RESPIRATORY_TRACT

## 2022-03-25 MED ORDER — SPIRONOLACTONE 25 MG PO TABS
25.0000 mg | ORAL_TABLET | Freq: Every day | ORAL | Status: DC
  Administered 2022-03-26 – 2022-03-28 (×3): 25 mg via ORAL
  Filled 2022-03-25 (×3): qty 1

## 2022-03-25 MED ORDER — BUDESONIDE 0.5 MG/2ML IN SUSP
0.5000 mg | Freq: Once | RESPIRATORY_TRACT | Status: AC
  Administered 2022-03-25: 0.5 mg via RESPIRATORY_TRACT
  Filled 2022-03-25: qty 2

## 2022-03-25 MED ORDER — ENOXAPARIN SODIUM 100 MG/ML IJ SOSY
100.0000 mg | PREFILLED_SYRINGE | INTRAMUSCULAR | Status: DC
  Filled 2022-03-25: qty 1

## 2022-03-25 MED ORDER — LEVALBUTEROL HCL 1.25 MG/0.5ML IN NEBU
INHALATION_SOLUTION | RESPIRATORY_TRACT | Status: AC
  Filled 2022-03-25: qty 0.5

## 2022-03-25 MED ORDER — IPRATROPIUM BROMIDE 0.02 % IN SOLN
0.5000 mg | Freq: Four times a day (QID) | RESPIRATORY_TRACT | Status: DC
  Administered 2022-03-25 – 2022-03-27 (×9): 0.5 mg via RESPIRATORY_TRACT
  Filled 2022-03-25 (×9): qty 2.5

## 2022-03-25 MED ORDER — ACETAMINOPHEN 325 MG PO TABS
650.0000 mg | ORAL_TABLET | Freq: Four times a day (QID) | ORAL | Status: DC | PRN
  Administered 2022-03-25 – 2022-03-28 (×5): 650 mg via ORAL
  Filled 2022-03-25 (×7): qty 2

## 2022-03-25 MED ORDER — ENOXAPARIN SODIUM 40 MG/0.4ML IJ SOSY: 40.0000 mg | PREFILLED_SYRINGE | INTRAMUSCULAR | Status: DC

## 2022-03-25 MED ORDER — HYDROCODONE BIT-HOMATROP MBR 5-1.5 MG/5ML PO SOLN: 5.0000 mL | ORAL | Status: DC | PRN

## 2022-03-25 MED ORDER — ENOXAPARIN SODIUM 80 MG/0.8ML IJ SOSY: 80.0000 mg | PREFILLED_SYRINGE | Freq: Every day | INTRAMUSCULAR | Status: DC

## 2022-03-25 MED ORDER — SODIUM CHLORIDE 0.9 % IV SOLN: 1.0000 g | Freq: Two times a day (BID) | INTRAVENOUS | Status: DC

## 2022-03-25 MED ORDER — ENOXAPARIN SODIUM 100 MG/ML IJ SOSY
200.0000 mg | PREFILLED_SYRINGE | Freq: Two times a day (BID) | INTRAMUSCULAR | Status: DC
  Administered 2022-03-25 – 2022-03-26 (×3): 200 mg via SUBCUTANEOUS
  Filled 2022-03-25 (×3): qty 2

## 2022-03-25 MED ORDER — DOFETILIDE 250 MCG PO CAPS
500.0000 ug | ORAL_CAPSULE | Freq: Two times a day (BID) | ORAL | Status: DC
  Administered 2022-03-25 – 2022-03-28 (×7): 500 ug via ORAL
  Filled 2022-03-25 (×9): qty 2

## 2022-03-25 NOTE — ED Notes (Signed)
Pt removed self from [place], stating "could not tolerate any longer". Placed on 3L [place]. SPO2 down to 91% on 3L. RT, MD, and NP notified.

## 2022-03-25 NOTE — Progress Notes (Signed)
Went back to room to evaluate patient.  She is still working quite hard to breathe on 3 L nasal cannula oxygen and has been unable to tolerate BiPAP due to sensation of smothering.  Instructed patient that we will trial heated high flow nasal cannula oxygen at 20 L/min and adjust oxygen percentage as necessary.  Patient agrees.

## 2022-03-25 NOTE — Progress Notes (Signed)
Notified Lab that ABG being sent for analysis. 

## 2022-03-25 NOTE — ED Notes (Signed)
RT and NP at [facility]. ABG complete. D-dimer added to recent labs. VSS. Meds given/ infusing.

## 2022-03-25 NOTE — Progress Notes (Signed)
PT is a CO2 retainer (barely compensated). PT is currently on 4 LPM with Sp02 94%. PT does get SOB with cough but seems to recover. PT was just out of bed for ADL's, once returned to bed PT had mild WOB and Sp02 of 95%- RN aware.

## 2022-03-25 NOTE — ED Triage Notes (Signed)
Pt arrives EMS with shortness of breath and cough x1 week. Pt diagnosed with bronchitis. Pt had increased work of breathing tonight and was hypoxic in the 70s on EMS arrival. Pt was given 125 solumedrol, 2g mag, duoneb and albuterol. Pt reports hx of CHF and afib.

## 2022-03-25 NOTE — H&P (Signed)
History and Physical    Patient: Alyssa Blanchard ZOX:[id number] DOB: 04-Mar-1971 DOA: 03/25/2022 DOS: the patient was seen and examined on 03/25/2022 PCP: Randel Pigg, Dorma Russell, MD  Patient coming from: Home  Chief Complaint:  Chief Complaint  Patient presents with   Shortness of Breath   HPI: Alyssa Blanchard is a 52 y.o. female with medical history significant of asthma, hypoventilation syndrome related to obesity, atrial fibrillation on Eliquis, type 2 diabetes, atrial fibrillation, heart failure (presumed diastolic dysfunction), morbid obesity, and eczema on Dupixent.  Patient presented to the ER after 1 week of symptoms related to shortness of breath, productive cough and chest discomfort.  She was treated as an outpatient for bronchitis and started on doxycycline which made her nauseous and gave her diarrhea.  Because of increasing shortness of breath that worsened just prior to presentation to the ER on 1/15 EMS was called to her home.  She was found to be hypoxic with O2 sats in the 70s.  In the field patient was given 125 mg of Solu-Medrol, 2 mg of magnesium, neb with albuterol and Atrovent.  Of note patient typically does not take albuterol because of secondary tachycardia.  Upon presentation to the ER patient was afebrile and otherwise hemodynamically stable.  Her O2 sats were 96% on 4 L of oxygen.  Labs were unremarkable except for a VBG that demonstrated a PaO2 of 72.  White count was normal.  PCR panel was positive for RSV.  Portable chest x-ray revealed low lung volumes without acute findings.  Upon my evaluation of the patient earlier this morning she remained on 4 L of oxygen but was complaining of pleuritic chest pain and was noticeably demonstrating increased work of breathing.  She reports that she does take Dupixent for her eczema.  She has been having a productive cough with yellow sputum.  She reports that typically she is able to walk around without any significant dyspnea and  does not wear oxygen regularly.  She states she can also lay semirecumbent without any shortness of breath but she is unable to do at this point.  An ABG subsequently was obtained and this revealed a PaO2 of 71 on 4 L of oxygen.  Decision was made to transition patient to BiPAP because of persistent hypoxemia and increased work of breathing.  Her FiO2 on BiPAP is now 28% and she has less increased work of breathing.  Patient will be admitted to the stepdown unit for further evaluation and treatment.   Review of Systems: As mentioned in the history of present illness. All other systems reviewed and are negative. Past Medical History:  Diagnosis Date   Anxiety    Asthma    Atrial fibrillation (HCC)    Depression    DM (diabetes mellitus), type 2 (HCC) 02/16/2022   Dysrhythmia    new onset Afib rvr   GERD (gastroesophageal reflux disease)    Heart failure (HCC)    Heel spur    bilat   History of bronchitis    History of chicken pox    History of urinary tract infection    Migraines    Plantar fasciitis    bilat   STD (sexually transmitted disease)    chl hx & hsv 1&2   Tremors of nervous system    Urinary incontinence    Past Surgical History:  Procedure Laterality Date   CARDIOVERSION N/A 08/05/2019   Procedure: CARDIOVERSION;  Surgeon: Nahser, Deloris Ping, MD;  Location: [facility] ENDOSCOPY;  Service: Cardiovascular;  Laterality: N/A;   LAPAROSCOPIC GASTRIC SLEEVE RESECTION N/A 11/27/2014   Procedure: LAPAROSCOPIC GASTRIC SLEEVE RESECTION WITH HIATAL HERNIA REPAIR UPPER ENDOSCOPY;  Surgeon: Johnathan Hausen, MD;  Location: [facility] ORS;  Service: General;  Laterality: N/A;   TEE WITHOUT CARDIOVERSION N/A 08/05/2019   Procedure: TRANSESOPHAGEAL ECHOCARDIOGRAM (TEE);  Surgeon: Acie Fredrickson Wonda Cheng, MD;  Location: [facility] ENDOSCOPY;  Service: Cardiovascular;  Laterality: N/A;   TONSILLECTOMY  1978   Social History:  reports that she quit smoking about 10 years ago. Her smoking use included cigarettes. She has a  1.25 pack-year smoking history. She has never used smokeless tobacco. She reports that she does not currently use alcohol. She reports that she does not currently use drugs after having used the following drugs: Marijuana and Cocaine.  Allergies  Allergen Reactions   Gabapentin Anaphylaxis   Topiramate Other (See Comments)    Jaw tightness and chest discomfort    Advair Diskus [Fluticasone-Salmeterol]    Hydroxyzine     QT prolongation related   Imitrex [Sumatriptan] Other (See Comments)    Increased heart rate   Latex Itching   Montelukast Other (See Comments)    Nightmares    Other Itching and Other (See Comments)    Pt states "no narcotics ever" 04/13/2019- denies this entry in 01/2020 "Mental health meds, in general, don't agree with me" Plastic and artificial Christmas trees = Itching   Prozac [Fluoxetine] Other (See Comments)    Bad, vivid dreams   Trazodone And Nefazodone Other (See Comments)    Bad dreams 01/27/20 Pt is unsure if allergic to Nefazodone   Copper-Containing Compounds Rash and Other (See Comments)    Makes the face burn, also    Family History  Problem Relation Age of Onset   Diabetes Mother    Hypertension Mother    Hyperlipidemia Mother    Kidney disease Mother        CKD stage 4   Cancer Father        pancreatic   Hypertension Maternal Grandmother    Diabetes Maternal Grandmother    Heart failure Maternal Grandmother        CHF   Heart attack Maternal Grandfather    Hypertension Maternal Grandfather    Hypertension Paternal Grandmother    Alzheimer's disease Paternal Grandmother    Hypertension Paternal Grandfather    Cancer Maternal Uncle        melanoma   Cancer Paternal Uncle        kidney    Prior to Admission medications   Medication Sig Start Date End Date Taking? Authorizing Provider  acetaminophen (TYLENOL) 325 MG tablet Take 1-2 tablets (325-650 mg total) by mouth every 4 (four) hours as needed for mild pain. 03/11/22   Angiulli,  Lavon Paganini, PA-C  albuterol (VENTOLIN HFA) 108 (90 Base) MCG/ACT inhaler Inhale 2 puffs into the lungs every 6 (six) hours as needed for wheezing or shortness of breath. Patient not taking: Reported on 03/19/2022 03/11/22   Angiulli, Lavon Paganini, PA-C  apixaban (ELIQUIS) 5 MG TABS tablet Take 1 tablet (5 mg total) by mouth 2 (two) times daily. 03/11/22 04/10/22  Angiulli, Lavon Paganini, PA-C  bumetanide (BUMEX) 1 MG tablet Take 1 tablet (1 mg total) by mouth daily. 03/11/22   Angiulli, Lavon Paganini, PA-C  busPIRone (BUSPAR) 10 MG tablet Take 1 tablet (10 mg total) by mouth 2 (two) times daily. 03/11/22   Angiulli, Lavon Paganini, PA-C  dofetilide (TIKOSYN) 500 MCG capsule Take 1 capsule (  500 mcg total) by mouth 2 (two) times daily. 03/11/22   Angiulli, Mcarthur Rossetti, PA-C  Dupilumab (DUPIXENT) 300 MG/2ML SOPN Inject 300 mg into the skin every 14 (fourteen) days.    [provider]  fluticasone furoate-vilanterol (BREO ELLIPTA) 200-25 MCG/ACT AEPB Inhale 1 puff into the lungs daily. 03/11/22   Angiulli, Mcarthur Rossetti, PA-C  glipiZIDE (GLUCOTROL) 10 MG tablet Take 10 mg by mouth 2 (two) times daily before a meal. 12/02/21 03/02/22  [provider]  magnesium oxide (MAG-OX) 400 MG tablet Take 1 tablet (400 mg total) by mouth daily. 03/11/22   Angiulli, Mcarthur Rossetti, PA-C  melatonin 5 MG TABS Take 2 tablets (10 mg total) by mouth daily. 03/11/22   Angiulli, Mcarthur Rossetti, PA-C  Multiple Vitamins-Minerals (BARIATRIC MULTIVITAMINS/IRON PO) Take 1 tablet by mouth daily.    [provider]  nystatin (MYCOSTATIN/NYSTOP) powder Apply topically 2 (two) times daily. Patient taking differently: Apply 1 Application topically 2 (two) times daily as needed (skin irritation). 02/01/20   Armandina Stammer I, NP  oxyCODONE (OXY IR/ROXICODONE) 5 MG immediate release tablet Take 1 tablet (5 mg total) by mouth every 4 (four) hours as needed for breakthrough pain. 03/21/22   Fanny Dance, MD  pantoprazole (PROTONIX) 40 MG tablet Take 1 tablet (40 mg  total) by mouth 2 (two) times daily. 03/11/22   Angiulli, Mcarthur Rossetti, PA-C  polyethylene glycol powder (GLYCOLAX/MIRALAX) 17 GM/SCOOP powder Take 17 g by mouth daily as needed for mild constipation. 01/10/22   [provider]  potassium chloride SA (KLOR-CON M) 20 MEQ tablet Take 1 tablet (20 mEq total) by mouth daily. 03/11/22   Angiulli, Mcarthur Rossetti, PA-C  pregabalin (LYRICA) 100 MG capsule Take 1 capsule (100 mg total) by mouth 2 (two) times daily. 03/11/22   Angiulli, Mcarthur Rossetti, PA-C  propranolol (INDERAL) 60 MG tablet Take 1 tablet (60 mg total) by mouth 3 (three) times daily. 03/11/22   Angiulli, Mcarthur Rossetti, PA-C  scopolamine (TRANSDERM-SCOP) 1 MG/3DAYS Place 1 patch (1.5 mg total) onto the skin every 3 (three) days. 03/12/22   Angiulli, Mcarthur Rossetti, PA-C  Semaglutide,0.25 or 0.5MG /DOS, (OZEMPIC, 0.25 OR 0.5 MG/DOSE,) 2 MG/3ML SOPN Inject 0.25 mg into the skin once a week. Wednesday ( 02-19-22) is the last dose will increase to 0.5 mg weekly.    [provider]  silver sulfADIAZINE (SILVADENE) 1 % cream Apply 1 Application topically daily. Apply to affected area 03/11/22   Angiulli, Mcarthur Rossetti, PA-C  spironolactone (ALDACTONE) 25 MG tablet Take 1 tablet (25 mg total) by mouth daily. 03/11/22   Angiulli, Mcarthur Rossetti, PA-C  Vitamin D, Ergocalciferol, (DRISDOL) 1.25 MG (50000 UNIT) CAPS capsule Take 1 capsule (50,000 Units total) by mouth once a week. Monday 03/11/22   Angiulli, Mcarthur Rossetti, PA-C  VOLTAREN ARTHRITIS PAIN 1 % GEL Apply 2 g topically 4 (four) times daily as needed (knee pain). 01/20/22   [provider]  levalbuterol Pauline Aus HFA) 45 MCG/ACT inhaler Inhale 2 puffs into the lungs every 6 (six) hours as needed for wheezing or shortness of breath. 06/28/19 03/11/22  [provider]    Physical Exam: Vitals:   03/25/22 0515 03/25/22 0545 03/25/22 0600 03/25/22 0615  BP:  126/72 132/62 139/77  Pulse: 77 73 69 80  Resp: 20 19  18   Temp:      TempSrc:      SpO2: 96% 95% 96% 94%    Constitutional: NAD, calm, uncomfortable secondary to pleuritic chest pain and increased work of  breathing Respiratory: Lung sounds are diminished throughout with expiratory wheezing, increased work of breathing, 4 L oxygen, O2 sats 94 to 96% with respiratory rates in the mid 20s. Cardiovascular: Regular rate and rhythm, no murmurs / rubs / gallops. No extremity edema. 2+ pedal pulses.  Abdomen: no tenderness, no masses palpated. No obvious hepatosplenomegaly but difficult to assess given body habitus. Bowel sounds positive.  Musculoskeletal: no clubbing / cyanosis. No joint deformity upper and lower extremities. Good ROM, no contractures. Normal muscle tone.  Skin: Several areas of small eczematous appearing lesions on extremities and chest wall. Neurologic: CN 2-12 grossly intact. Sensation intact, DTR normal. Strength 5/5 x all 4 extremities.  Psychiatric: Normal judgment and insight. Alert and oriented x 3. Normal mood.     Data Reviewed:  Initial sodium 135, potassium 3.7, glucose 148, BUN 8, creatinine 0.91, BNP 104.6, troponin negative, white count 7500, hemoglobin 10.8, platelets 177,000, PCR positive for RSV  Chest x-ray as above.  Assessment and Plan: Acute hypoxemic respiratory failure secondary to acute RSV Underlying asthma and obesity hypoventilation syndrome Suspected superimposed secondary bacterial pneumonia/bronchitis Continue supportive care with BiPAP and wean as tolerated back to nasal cannula oxygen.  Patient does not utilize oxygen or CPAP at home. Begin Rocephin and Zithromax IV for suspected evolving pneumonia versus bronchitis. Check CRP and procalcitonin, check urinary strep; obtain sputum culture Patient on Eliquis prior to admission and D-dimer unremarkable so low suspicion that symptoms are related to PE Add budesonide nebs Patient intolerant to albuterol secondary to tachycardia so we will utilize Xopenex nebs Continue IV steroids  Atrial fibrillation on  Eliquis Continue preadmission Tikosyn and Inderal While BiPAP in place we will utilize full dose Lovenox Need to keep potassium /= 4.0  History of CHF Continue preadmission Bumex with Aldactone and potassium  Diabetes mellitus 2 with neuropathy Follow CBGs and provide resistant SSI Continue preadmission Lyrica and gabapentin Monitor for hypoglycemia secondary to steroids Patient was on Ozempic prior to admission Once off BiPAP will need to get out of bed and participate with PT and OT given associated history of osteoarthritis as well  History of eczema On Dupixent at home injected every 14 days Last dose not recorded but will hold acutely given concern over possible bacterial infection  Morbid obesity Contributing factor to patient's current pulmonary issues Also on Ozempic prior to admission which will hopefully help in weight reduction 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 Inpatient status is judged to be reasonable and necessary in order to provide the required intensity of service to ensure the patient's safety. The patient's presenting symptoms, physical exam findings, and initial radiographic and laboratory data in the context of their chronic comorbidities is felt to place them at high risk for further clinical deterioration. Furthermore, it is not anticipated that the patient will be medically stable for discharge from the hospital within 2 midnights of admission.   * I certify that at the point of admission it is my clinical judgment that the patient will require inpatient hospital care spanning beyond 2 midnights from the point of admission due to high intensity of  service, high risk for further deterioration and high frequency of surveillance required.*  Author: Junious Silk, NP 03/25/2022 7:01 AM  For on call review [email or web address].

## 2022-03-25 NOTE — Progress Notes (Signed)
  Transition of Care (TOC) Screening Note   Patient Details  Name: Daryle Boyington Date of Birth: [birth year]   Transition of Care [facility]) CM/SW Contact:    Roseanne Kaufman, RN Phone Number: 03/25/2022, 3:55 PM    Transition of Care Department [facility]) has reviewed patient and no TOC needs have been identified at this time. We will continue to monitor patient advancement through interdisciplinary progression rounds. If new patient transition needs arise, please place a TOC consult.

## 2022-03-25 NOTE — Progress Notes (Signed)
   Carryover admission to the Day Admitter.  I discussed this case with the EDP, Antonietta Breach, [place].  Per these discussions:   This is a 52 year old female with history of moderate persistent asthma, paroxysmal atrial fibrillation, who is being admitted with acute hypoxic respiratory failure in the setting of RSV infection and resultant acute asthma exacerbation.   Patient has been experiencing 1 week of progressive shortness of breath associated with new onset wheezing, and conveys no known baseline supplemental oxygen requirements.  Was noted by EMS to have initial oxygen saturations in the 70s on room air, now maintaining oxygen saturations in the mid 90s on 4 L nasal cannula.  Received the following via EMS and route to [facility] emergency department: Solumedrol 125 mg IV x 1, 2 g of magnesium sulfate, and 2 nebulizer treatment.  Found to be RSV positive in the ED this evening.   Of note, the patient has a reported history atrial fibrillation with RVR in the context of albuterol.  Will therefore proceed with Xopenex as preferential needed to agonist.  I have placed an order for   Inpatient admission to PCU for further evaluation management of the above.  I have placed some additional preliminary admit orders via the adult multi-morbid admission order set. I have also ordered solumedrol 80 mg IV twice daily, scheduled Xopenex/ipratropium nebulizers, as needed Xopenex nebulizers.  I have also added on serum magnesium and phosphorus levels.    Babs Bertin, DO Hospitalist

## 2022-03-25 NOTE — ED Provider Notes (Signed)
[facility] DEPT Provider Note   CSN: [id number] Arrival date & time: 03/25/22  0357     History  Chief Complaint  Patient presents with   Shortness of Breath    Alyssa Blanchard is a 52 y.o. female.  52 year old female with a history of atrial fibrillation (on chronic Eliquis), asthma, CHF, diabetes presents to the emergency department for shortness of breath.  Shortness of breath has worsened over the past 2 days.  Symptoms began as a cough 1 week ago with nasal congestion and sore throat.  She took 2 home COVID tests which were negative.  Notes increased work of breathing last night.  EMS was called and patient was given breathing treatments, but she declined transport to the ED.  EMS called out again this evening and patient was noted to be hypoxic in the 70s.  She was given 125mg  Solu-Medrol, 2g MgSO4, Duoneb, and albuterol nebulizer treatment PTA. Patient has been compliant with her daily medications. Denies fevers, vomiting, syncope, weight gain, notable changes to baseline orthopnea. No hx of chronic O2 requirement.  The history is provided by the patient. No language interpreter was used.  Shortness of Breath      Home Medications Prior to Admission medications   Medication Sig Start Date End Date Taking? Authorizing Provider  acetaminophen (TYLENOL) 325 MG tablet Take 1-2 tablets (325-650 mg total) by mouth every 4 (four) hours as needed for mild pain. 03/11/22   Angiulli, Lavon Paganini, PA-C  albuterol (VENTOLIN HFA) 108 (90 Base) MCG/ACT inhaler Inhale 2 puffs into the lungs every 6 (six) hours as needed for wheezing or shortness of breath. Patient not taking: Reported on 03/19/2022 03/11/22   Angiulli, Lavon Paganini, PA-C  apixaban (ELIQUIS) 5 MG TABS tablet Take 1 tablet (5 mg total) by mouth 2 (two) times daily. 03/11/22 04/10/22  Angiulli, Lavon Paganini, PA-C  bumetanide (BUMEX) 1 MG tablet Take 1 tablet (1 mg total) by mouth daily. 03/11/22   Angiulli, Lavon Paganini,  PA-C  busPIRone (BUSPAR) 10 MG tablet Take 1 tablet (10 mg total) by mouth 2 (two) times daily. 03/11/22   Angiulli, Lavon Paganini, PA-C  dofetilide (TIKOSYN) 500 MCG capsule Take 1 capsule (500 mcg total) by mouth 2 (two) times daily. 03/11/22   Angiulli, Lavon Paganini, PA-C  Dupilumab (DUPIXENT) 300 MG/2ML SOPN Inject 300 mg into the skin every 14 (fourteen) days.    [provider]  fluticasone furoate-vilanterol (BREO ELLIPTA) 200-25 MCG/ACT AEPB Inhale 1 puff into the lungs daily. 03/11/22   Angiulli, Lavon Paganini, PA-C  glipiZIDE (GLUCOTROL) 10 MG tablet Take 10 mg by mouth 2 (two) times daily before a meal. 12/02/21 03/02/22  [provider]  magnesium oxide (MAG-OX) 400 MG tablet Take 1 tablet (400 mg total) by mouth daily. 03/11/22   Angiulli, Lavon Paganini, PA-C  melatonin 5 MG TABS Take 2 tablets (10 mg total) by mouth daily. 03/11/22   Angiulli, Lavon Paganini, PA-C  Multiple Vitamins-Minerals (BARIATRIC MULTIVITAMINS/IRON PO) Take 1 tablet by mouth daily.    [provider]  nystatin (MYCOSTATIN/NYSTOP) powder Apply topically 2 (two) times daily. Patient taking differently: Apply 1 Application topically 2 (two) times daily as needed (skin irritation). 02/01/20   Lindell Spar I, NP  oxyCODONE (OXY IR/ROXICODONE) 5 MG immediate release tablet Take 1 tablet (5 mg total) by mouth every 4 (four) hours as needed for breakthrough pain. 03/21/22   Jennye Boroughs, MD  pantoprazole (PROTONIX) 40 MG tablet Take 1 tablet (40 mg  total) by mouth 2 (two) times daily. 03/11/22   Angiulli, Mcarthur Rossetti, PA-C  polyethylene glycol powder (GLYCOLAX/MIRALAX) 17 GM/SCOOP powder Take 17 g by mouth daily as needed for mild constipation. 01/10/22   [provider]  potassium chloride SA (KLOR-CON M) 20 MEQ tablet Take 1 tablet (20 mEq total) by mouth daily. 03/11/22   Angiulli, Mcarthur Rossetti, PA-C  pregabalin (LYRICA) 100 MG capsule Take 1 capsule (100 mg total) by mouth 2 (two) times daily. 03/11/22   Angiulli, Mcarthur Rossetti, PA-C   propranolol (INDERAL) 60 MG tablet Take 1 tablet (60 mg total) by mouth 3 (three) times daily. 03/11/22   Angiulli, Mcarthur Rossetti, PA-C  scopolamine (TRANSDERM-SCOP) 1 MG/3DAYS Place 1 patch (1.5 mg total) onto the skin every 3 (three) days. 03/12/22   Angiulli, Mcarthur Rossetti, PA-C  Semaglutide,0.25 or 0.5MG /DOS, (OZEMPIC, 0.25 OR 0.5 MG/DOSE,) 2 MG/3ML SOPN Inject 0.25 mg into the skin once a week. Wednesday ( 02-19-22) is the last dose will increase to 0.5 mg weekly.    [provider]  silver sulfADIAZINE (SILVADENE) 1 % cream Apply 1 Application topically daily. Apply to affected area 03/11/22   Angiulli, Mcarthur Rossetti, PA-C  spironolactone (ALDACTONE) 25 MG tablet Take 1 tablet (25 mg total) by mouth daily. 03/11/22   Angiulli, Mcarthur Rossetti, PA-C  Vitamin D, Ergocalciferol, (DRISDOL) 1.25 MG (50000 UNIT) CAPS capsule Take 1 capsule (50,000 Units total) by mouth once a week. Monday 03/11/22   Angiulli, Mcarthur Rossetti, PA-C  VOLTAREN ARTHRITIS PAIN 1 % GEL Apply 2 g topically 4 (four) times daily as needed (knee pain). 01/20/22   [provider]  levalbuterol Pauline Aus HFA) 45 MCG/ACT inhaler Inhale 2 puffs into the lungs every 6 (six) hours as needed for wheezing or shortness of breath. 06/28/19 03/11/22  [provider]      Allergies    Gabapentin, Topiramate, Advair diskus [fluticasone-salmeterol], Hydroxyzine, Imitrex [sumatriptan], Latex, Montelukast, Other, Prozac [fluoxetine], Trazodone and nefazodone, and Copper-containing compounds    Review of Systems   Review of Systems  Respiratory:  Positive for shortness of breath.   Ten systems reviewed and are negative for acute change, except as noted in the HPI.    Physical Exam Updated Vital Signs BP 139/77   Pulse 80   Temp 98.3 F (36.8 C) (Oral)   Resp 18   SpO2 94%   Physical Exam Vitals and nursing note reviewed.  Constitutional:      General: She is not in acute distress.    Appearance: She is well-developed. She is ill-appearing.  She is not diaphoretic.     Comments: Morbidly obese female. Ill appearing, but pleasant. Nontoxic.  HENT:     Head: Normocephalic and atraumatic.  Eyes:     General: No scleral icterus.    Conjunctiva/sclera: Conjunctivae normal.  Cardiovascular:     Rate and Rhythm: Normal rate and regular rhythm.     Pulses: Normal pulses.  Pulmonary:     Effort: Pulmonary effort is normal. No respiratory distress.     Breath sounds: No stridor. Wheezing present.     Comments: Mildly dyspneic and tachypneic. SpO2 96% on 6L via [place]. Diffuse expiratory wheezing noted on auscultation. Chest expansion symmetric w/o accessory muscle use. Musculoskeletal:        General: Normal range of motion.     Cervical back: Normal range of motion.     Comments: No significant BLE pitting edema.  Skin:    General: Skin is warm and dry.  Coloration: Skin is not pale.     Findings: No erythema or rash.  Neurological:     Mental Status: She is alert and oriented to person, place, and time.     Coordination: Coordination normal.  Psychiatric:        Behavior: Behavior normal.     ED Results / Procedures / Treatments   Labs (all labs ordered are listed, but only abnormal results are displayed) Labs Reviewed  RESP PANEL BY RT-PCR (RSV, FLU A&B, COVID)  RVPGX2 - Abnormal; Notable for the following components:      Result Value   Resp Syncytial Virus by PCR POSITIVE (*)    All other components within normal limits  CBC WITH DIFFERENTIAL/PLATELET - Abnormal; Notable for the following components:   Hemoglobin 10.8 (*)    MCHC 29.1 (*)    RDW 17.8 (*)    All other components within normal limits  BASIC METABOLIC PANEL - Abnormal; Notable for the following components:   Glucose, Bld 148 (*)    Calcium 8.6 (*)    All other components within normal limits  BRAIN NATRIURETIC PEPTIDE - Abnormal; Notable for the following components:   B Natriuretic Peptide 104.6 (*)    All other components within normal limits   BLOOD GAS, VENOUS - Abnormal; Notable for the following components:   pO2, Ven 72 (*)    Bicarbonate 29.6 (*)    Acid-Base Excess 3.4 (*)    All other components within normal limits  PHOSPHORUS  MAGNESIUM  TROPONIN I (HIGH SENSITIVITY)  TROPONIN I (HIGH SENSITIVITY)    EKG EKG Interpretation  Date/Time:  Tuesday March 25 2022 04:30:49 EST Ventricular Rate:  82 PR Interval:  176 QRS Duration: 92 QT Interval:  415 QTC Calculation: 485 R Axis:   31 Text Interpretation: Sinus rhythm Low voltage, precordial leads Confirmed by Tilden Fossa [phone]) on 03/25/2022 6:27:32 AM  Radiology DG Chest Port 1 View  Result Date: 03/25/2022 CLINICAL DATA:  Shortness of breath.  History of bronchitis.  Cough. EXAM: PORTABLE CHEST 1 VIEW COMPARISON:  01/31/2022 FINDINGS: Stable cardiac enlargement. Lung volumes are low. No pleural effusion or edema. No airspace opacities identified. IMPRESSION: Low lung volumes.  No acute findings. Electronically Signed   By: Signa Kell M.D.   On: 03/25/2022 05:53    Procedures .Critical Care  Performed by: Antony Madura, PA-C Authorized by: Antony Madura, PA-C   Critical care provider statement:    Critical care time (minutes):  45   Critical care time was exclusive of:  Separately billable procedures and treating other patients   Critical care was necessary to treat or prevent imminent or life-threatening deterioration of the following conditions:  Respiratory failure   Critical care was time spent personally by me on the following activities:  Development of treatment plan with patient or surrogate, discussions with consultants, evaluation of patient's response to treatment, examination of patient, ordering and review of laboratory studies, ordering and review of radiographic studies, ordering and performing treatments and interventions, pulse oximetry, re-evaluation of patient's condition, review of old charts and obtaining history from patient or  surrogate   I assumed direction of critical care for this patient from another provider in my specialty: no     Care discussed with: admitting provider       Medications Ordered in ED Medications  levalbuterol (XOPENEX) 1.25 MG/0.5ML nebulizer solution (  Not Given 03/25/22 0436)  acetaminophen (TYLENOL) tablet 650 mg (has no administration in time range)  Or  acetaminophen (TYLENOL) suppository 650 mg (has no administration in time range)  methylPREDNISolone sodium succinate (SOLU-MEDROL) 125 mg/2 mL injection 80 mg (has no administration in time range)  levalbuterol (XOPENEX) nebulizer solution 1.25 mg (has no administration in time range)  levalbuterol (XOPENEX) nebulizer solution 1.25 mg (has no administration in time range)  ipratropium (ATROVENT) nebulizer solution 0.5 mg (has no administration in time range)  levalbuterol (XOPENEX) nebulizer solution 1.25 mg (1.25 mg Nebulization Given 03/25/22 0435)    ED Course/ Medical Decision Making/ A&P Clinical Course as of 03/25/22 0656  Tue Mar 25, 2022  0644 Case discussed with Dr. Arlean Hopping [KH]    Clinical Course User Index [KH] Antony Madura, PA-C                             Medical Decision Making Amount and/or Complexity of Data Reviewed Labs: ordered. Radiology: ordered. ECG/medicine tests: ordered.  Risk Prescription drug management. Decision regarding hospitalization.   This patient presents to the ED for concern of SOB, this involves an extensive number of treatment options, and is a complaint that carries with it a high risk of complications and morbidity.  The differential diagnosis includes viral illness vs asthma exacerbation vs CHF exacerbation vs PE vs PNA vs pleural effusion vs Afib w/RVR   Co morbidities that complicate the patient evaluation  Afib (on chronic Eliquis) Asthma CHF   Additional history obtained:  Additional history obtained from EMS External records from outside source obtained and  reviewed including negative CTA PE study in November 2021   Lab Tests:  I Ordered, and personally interpreted labs.  The pertinent results include:  RSV positive. BNP is 104.6 (stable/improved compared to baseline). Chronic anemia of 10.8 is stable. Troponin negative.   Imaging Studies ordered:  I ordered imaging studies including CXR  I independently visualized and interpreted imaging which showed no acute cardiopulmonary abnormality I agree with the radiologist interpretation   Cardiac Monitoring:  The patient was maintained on a cardiac monitor.  I personally viewed and interpreted the cardiac monitored which showed an underlying rhythm of: NSR   Medicines ordered and prescription drug management:  I ordered medication including Xopenex nebulizer for SOB, wheezing Reevaluation of the patient after these medicines showed that the patient improved I have reviewed the patients home medicines and have made adjustments as needed   Test Considered:  ABG   Critical Interventions:  Continuation of supplemental oxygen for hypoxia   Consultations Obtained:  I requested consultation with the hospitalist service and discussed lab and imaging findings as well as pertinent plan - they agree with plan for admission.   Reevaluation:  After the interventions noted above, I reevaluated the patient and found that they have :improved   Social Determinants of Health:  Insured patient Good medical literacy    Dispostion:  After consideration of the diagnostic results and the patients response to treatment, I feel that the patent would benefit from admission for management of acute respiratory failure secondary to hypoxia.  Likely associated asthma exacerbation contributing to hypoxia.  Will necessitate regular nebulizer treatments.  Steroids were initiated by EMS.  Case discussed with Dr. Arlean Hopping; [facility] to admit.         Final Clinical Impression(s) / ED Diagnoses Final  diagnoses:  Acute respiratory failure with hypoxia (HCC)  RSV (respiratory syncytial virus infection)    Rx / DC Orders ED Discharge Orders     None  Antonietta Breach, PA-C 03/25/22 0710    Quintella Reichert, MD 03/28/22 [phone]

## 2022-03-25 NOTE — ED Notes (Signed)
Requests b/r to void. Usually uses walker on O2. Will place on purewick.

## 2022-03-25 NOTE — Progress Notes (Signed)
When BiPAP was initiated, RT encouraged both PT and RN to keep PT room door open so any alarms can be heard.

## 2022-03-26 DIAGNOSIS — J9601 Acute respiratory failure with hypoxia: Secondary | ICD-10-CM | POA: Diagnosis not present

## 2022-03-26 LAB — COMPREHENSIVE METABOLIC PANEL
ALT: 17 U/L (ref 0–44)
AST: 23 U/L (ref 15–41)
Albumin: 3.6 g/dL (ref 3.5–5.0)
Alkaline Phosphatase: 55 U/L (ref 38–126)
Anion gap: 10 (ref 5–15)
BUN: 10 mg/dL (ref 6–20)
CO2: 25 mmol/L (ref 22–32)
Calcium: 8.9 mg/dL (ref 8.9–10.3)
Chloride: 100 mmol/L (ref 98–111)
Creatinine, Ser: 0.82 mg/dL (ref 0.44–1.00)
GFR, Estimated: >60 mL/min (ref >60)
Glucose, Bld: 201 mg/dL — ABNORMAL HIGH (ref 70–99)
Potassium: 4.3 mmol/L (ref 3.5–5.1)
Sodium: 135 mmol/L (ref 135–145)
Total Bilirubin: 0.5 mg/dL (ref 0.3–1.2)
Total Protein: 6.9 g/dL (ref 6.5–8.1)

## 2022-03-26 LAB — CBC
HCT: 34.5 % — ABNORMAL LOW (ref 36.0–46.0)
Hemoglobin: 10.4 g/dL — ABNORMAL LOW (ref 12.0–15.0)
MCH: 27.2 pg (ref 26.0–34.0)
MCHC: 30.1 g/dL (ref 30.0–36.0)
MCV: 90.1 fL (ref 80.0–100.0)
Platelets: 163 10*3/uL (ref 150–400)
RBC: 3.83 MIL/uL — ABNORMAL LOW (ref 3.87–5.11)
RDW: 17.5 % — ABNORMAL HIGH (ref 11.5–15.5)
WBC: 7.3 10*3/uL (ref 4.0–10.5)
nRBC: 0 % (ref 0.0–0.2)

## 2022-03-26 LAB — GLUCOSE, CAPILLARY
Glucose-Capillary: 191 mg/dL — ABNORMAL HIGH (ref 70–99)
Glucose-Capillary: 204 mg/dL — ABNORMAL HIGH (ref 70–99)
Glucose-Capillary: 194 mg/dL — ABNORMAL HIGH (ref 70–99)
Glucose-Capillary: 193 mg/dL — ABNORMAL HIGH (ref 70–99)
Glucose-Capillary: 188 mg/dL — ABNORMAL HIGH (ref 70–99)
Glucose-Capillary: 181 mg/dL — ABNORMAL HIGH (ref 70–99)
Glucose-Capillary: 191 mg/dL — ABNORMAL HIGH (ref 70–99)

## 2022-03-26 MED ORDER — INSULIN ASPART 100 UNIT/ML IJ SOLN
0.0000 [IU] | Freq: Three times a day (TID) | INTRAMUSCULAR | Status: DC
  Administered 2022-03-26 (×3): 3 [IU] via SUBCUTANEOUS
  Administered 2022-03-27 (×2): 2 [IU] via SUBCUTANEOUS
  Administered 2022-03-27: 3 [IU] via SUBCUTANEOUS

## 2022-03-26 MED ORDER — METOPROLOL TARTRATE 5 MG/5ML IV SOLN: 5.0000 mg | INTRAVENOUS | Status: DC | PRN

## 2022-03-26 MED ORDER — BENZONATATE 100 MG PO CAPS
200.0000 mg | ORAL_CAPSULE | Freq: Three times a day (TID) | ORAL | Status: DC
  Administered 2022-03-26 – 2022-03-28 (×7): 200 mg via ORAL
  Filled 2022-03-26 (×7): qty 2

## 2022-03-26 MED ORDER — MELATONIN 5 MG PO TABS
10.0000 mg | ORAL_TABLET | Freq: Every evening | ORAL | Status: DC | PRN
  Administered 2022-03-26 – 2022-03-27 (×2): 10 mg via ORAL
  Filled 2022-03-26 (×2): qty 2

## 2022-03-26 MED ORDER — INSULIN ASPART 100 UNIT/ML IJ SOLN: 0.0000 [IU] | Freq: Every day | INTRAMUSCULAR | Status: DC

## 2022-03-26 MED ORDER — TRAZODONE HCL 50 MG PO TABS: 50.0000 mg | ORAL_TABLET | Freq: Every evening | ORAL | Status: DC | PRN

## 2022-03-26 MED ORDER — METHYLPREDNISOLONE SODIUM SUCC 40 MG IJ SOLR
40.0000 mg | Freq: Two times a day (BID) | INTRAMUSCULAR | Status: DC
  Administered 2022-03-26 – 2022-03-27 (×3): 40 mg via INTRAVENOUS
  Filled 2022-03-26 (×3): qty 1

## 2022-03-26 MED ORDER — HYDRALAZINE HCL 20 MG/ML IJ SOLN: 10.0000 mg | INTRAMUSCULAR | Status: DC | PRN

## 2022-03-26 MED ORDER — SENNOSIDES-DOCUSATE SODIUM 8.6-50 MG PO TABS: 1.0000 | ORAL_TABLET | Freq: Every evening | ORAL | Status: DC | PRN

## 2022-03-26 MED ORDER — INSULIN ASPART 100 UNIT/ML IJ SOLN
2.0000 [IU] | Freq: Three times a day (TID) | INTRAMUSCULAR | Status: DC
  Administered 2022-03-26 – 2022-03-28 (×7): 2 [IU] via SUBCUTANEOUS

## 2022-03-26 MED ORDER — LORATADINE 10 MG PO TABS
10.0000 mg | ORAL_TABLET | Freq: Every day | ORAL | Status: DC
  Administered 2022-03-26 – 2022-03-28 (×3): 10 mg via ORAL
  Filled 2022-03-26 (×3): qty 1

## 2022-03-26 MED ORDER — ONDANSETRON HCL 4 MG/2ML IJ SOLN: 4.0000 mg | Freq: Four times a day (QID) | INTRAMUSCULAR | Status: DC | PRN

## 2022-03-26 MED ORDER — APIXABAN 5 MG PO TABS
5.0000 mg | ORAL_TABLET | Freq: Two times a day (BID) | ORAL | Status: DC
  Administered 2022-03-26 – 2022-03-28 (×4): 5 mg via ORAL
  Filled 2022-03-26 (×4): qty 1

## 2022-03-26 MED ORDER — BUDESONIDE 0.5 MG/2ML IN SUSP
0.5000 mg | Freq: Two times a day (BID) | RESPIRATORY_TRACT | Status: DC
  Administered 2022-03-26 – 2022-03-28 (×5): 0.5 mg via RESPIRATORY_TRACT
  Filled 2022-03-26 (×6): qty 2

## 2022-03-26 MED ORDER — MELATONIN 3 MG PO TABS
3.0000 mg | ORAL_TABLET | Freq: Once | ORAL | Status: AC
  Administered 2022-03-26: 3 mg via ORAL
  Filled 2022-03-26: qty 1

## 2022-03-26 MED ORDER — INSULIN GLARGINE-YFGN 100 UNIT/ML ~~LOC~~ SOLN
7.0000 [IU] | Freq: Every day | SUBCUTANEOUS | Status: DC
  Administered 2022-03-26 – 2022-03-28 (×3): 7 [IU] via SUBCUTANEOUS
  Filled 2022-03-26 (×3): qty 0.07

## 2022-03-26 NOTE — Progress Notes (Signed)
PROGRESS NOTE    Alyssa Blanchard  ZOX:[id number] DOB: 05/30/1970 DOA: 03/25/2022 PCP: Lenox Ponds, MD   Brief Narrative:  52 year old with history of asthma, hypoventilation syndrome secondary to obesity, A-fib on Eliquis, DM 2, diastolic CHF, eczema has been experiencing shortness of breath, productive cough and chest discomfort 1 week prior to ED arrival.  Was treated outpatient for bronchitis with doxycycline which gave her diarrhea.  Got admitted to the hospital due to worsening shortness of breath and hypoxia.  Chest x-ray showed low lung volumes, respiratory panel was positive for RSV.  Initially patient had to be placed on BiPAP.  BiPAP has now been weaned off.   Assessment & Plan:   Acute hypoxemic respiratory failure secondary to acute RSV Underlying asthma and obesity hypoventilation syndrome Suspected superimposed secondary bacterial pneumonia/bronchitis RSV infection leading to severe asthma exacerbation which is complicated by obesity hypoventilation syndrome.  She is currently on empiric azithromycin.  Procalcitonin 0.14, BNP 104.  Continue Solu-Medrol, aggressive bronchodilators scheduled and as needed, I-S/flutter valve.  Supplemental oxygen.  BiPAP as needed off. Urine strep-negative    Atrial fibrillation on Eliquis Patient is on Tikosyn and Inderal Will DC Lovenox and resume Eliquis    History of CHF, diastolic Continue preadmission Bumex with Aldactone and potassium   Diabetes mellitus 2 with neuropathy Sliding scale and Accu-Cheks.  Ozempic currently on hold. 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  On Ozempic at  home        DVT prophylaxis: Will be starting Eliquis Code Status: Full code Family Communication: None  Status is: Inpatient Still quite short of breath, maintain hospital stay.  Will transition to regular telemetry floor    Subjective: Seen and examined at bedside.  Feels a lot better.  Still quite short of breath.  Off BiPAP   Examination:  General exam: Appears calm and comfortable  Respiratory system: Diminished breath sounds bilaterally Cardiovascular system: S1 & S2 heard, RRR. No JVD, murmurs, rubs, gallops or clicks. No pedal edema. Gastrointestinal system: Abdomen is nondistended, soft and nontender. No organomegaly or masses felt. Normal bowel sounds heard. Central nervous system: Alert and oriented. No focal neurological deficits. Extremities: Symmetric 5 x 5 power. Skin: No rashes, lesions or ulcers Psychiatry: Judgement and insight appear normal. Mood & affect appropriate.     Objective: Vitals:   03/25/22 2300 03/25/22 2316 03/26/22 0029 03/26/22 0440  BP: (!) 133/58 (!) 133/58    Pulse: 86 90    Resp: 17     Temp:   98.8 F (37.1 C) 98.3 F (36.8 C)  TempSrc:   Oral Oral  SpO2: 95%     Weight:      Height:        Intake/Output Summary (Last 24 hours) at 03/26/2022 0721 Last data filed at 03/26/2022 0115 Gross per 24 hour  Intake 1330 ml  Output 2750 ml  Net -1420 ml   Filed Weights   03/25/22 1322  Weight: (!) 196.2 kg     Data Reviewed:   CBC: Recent Labs  Lab 03/25/22 0436 03/25/22 0850 03/26/22 0251  WBC 7.5 7.7 7.3  NEUTROABS 5.3  --   --  HGB 10.8* 11.4* 10.4*  HCT 37.1 38.7 34.5*  MCV 92.1 90.8 90.1  PLT 177 179 409   Basic Metabolic Panel: Recent Labs  Lab 03/25/22 0436 03/25/22 0606 03/25/22 0850 03/26/22 0251  NA 135  --   --  135  K 3.7  --   --  4.3  CL 103  --   --  100  CO2 24  --   --  25  GLUCOSE 148*  --   --  201*  BUN 8  --   --  10  CREATININE 0.91  --  0.89 0.82  CALCIUM 8.6*  --   --  8.9  MG   --  2.2  --   --   PHOS  --  4.5  --   --    GFR: Estimated Creatinine Clearance: 144.4 mL/min (by C-G formula based on SCr of 0.82 mg/dL). Liver Function Tests: Recent Labs  Lab 03/26/22 0251  AST 23  ALT 17  ALKPHOS 55  BILITOT 0.5  PROT 6.9  ALBUMIN 3.6   No results for input(s): "LIPASE", "AMYLASE" in the last 168 hours. No results for input(s): "AMMONIA" in the last 168 hours. Coagulation Profile: No results for input(s): "INR", "PROTIME" in the last 168 hours. Cardiac Enzymes: No results for input(s): "CKTOTAL", "CKMB", "CKMBINDEX", "TROPONINI" in the last 168 hours. BNP (last 3 results) No results for input(s): "PROBNP" in the last 8760 hours. HbA1C: Recent Labs    03/25/22 0850  HGBA1C 5.5   CBG: Recent Labs  Lab 03/25/22 1548 03/25/22 2059 03/25/22 2339 03/26/22 0116 03/26/22 0405  GLUCAP 173* 216* 198* 191* 204*   Lipid Profile: No results for input(s): "CHOL", "HDL", "LDLCALC", "TRIG", "CHOLHDL", "LDLDIRECT" in the last 72 hours. Thyroid Function Tests: No results for input(s): "TSH", "T4TOTAL", "FREET4", "T3FREE", "THYROIDAB" in the last 72 hours. Anemia Panel: No results for input(s): "VITAMINB12", "FOLATE", "FERRITIN", "TIBC", "IRON", "RETICCTPCT" in the last 72 hours. Sepsis Labs: Recent Labs  Lab 03/25/22 0850  PROCALCITON 0.14    Recent Results (from the past 240 hour(s))  Resp panel by RT-PCR (RSV, Flu A&B, Covid) Anterior Nasal Swab     Status: Abnormal   Collection Time: 03/25/22  4:21 AM   Specimen: Anterior Nasal Swab  Result Value Ref Range Status   SARS Coronavirus 2 by RT PCR NEGATIVE NEGATIVE Final    Comment: (NOTE) SARS-CoV-2 target nucleic acids are NOT DETECTED.  The SARS-CoV-2 RNA is generally detectable in upper respiratory specimens during the acute phase of infection. The lowest concentration of SARS-CoV-2 viral copies this assay can detect is 138 copies/mL. A negative result does not preclude SARS-Cov-2 infection  and should not be used as the sole basis for treatment or other patient management decisions. A negative result may occur with  improper specimen collection/handling, submission of specimen other than nasopharyngeal swab, presence of viral mutation(s) within the areas targeted by this assay, and inadequate number of viral copies(<138 copies/mL). A negative result must be combined with clinical observations, patient history, and epidemiological information. The expected result is Negative.  Fact Sheet for Patients:  EntrepreneurPulse.com.au  Fact Sheet for Healthcare Providers:  IncredibleEmployment.be  This test is no t yet approved or cleared by the [place] FDA and  has been authorized for detection and/or diagnosis of SARS-CoV-2 by FDA under an Emergency Use Authorization (EUA). This EUA will remain  in effect (meaning this test can be used) for the duration of the COVID-19 declaration under Section 564(b)(1)  of the Act, 21 U.S.C.section 360bbb-3(b)(1), unless the authorization is terminated  or revoked sooner.       Influenza A by PCR NEGATIVE NEGATIVE Final   Influenza B by PCR NEGATIVE NEGATIVE Final    Comment: (NOTE) The Xpert Xpress SARS-CoV-2/FLU/RSV plus assay is intended as an aid in the diagnosis of influenza from Nasopharyngeal swab specimens and should not be used as a sole basis for treatment. Nasal washings and aspirates are unacceptable for Xpert Xpress SARS-CoV-2/FLU/RSV testing.  Fact Sheet for Patients: EntrepreneurPulse.com.au  Fact Sheet for Healthcare Providers: IncredibleEmployment.be  This test is not yet approved or cleared by the [place] FDA and has been authorized for detection and/or diagnosis of SARS-CoV-2 by FDA under an Emergency Use Authorization (EUA). This EUA will remain in effect (meaning this test can be used) for the duration of the COVID-19 declaration  under Section 564(b)(1) of the Act, 21 U.S.C. section 360bbb-3(b)(1), unless the authorization is terminated or revoked.     Resp Syncytial Virus by PCR POSITIVE (A) NEGATIVE Final    Comment: (NOTE) Fact Sheet for Patients: EntrepreneurPulse.com.au  Fact Sheet for Healthcare Providers: IncredibleEmployment.be  This test is not yet approved or cleared by the [place] FDA and has been authorized for detection and/or diagnosis of SARS-CoV-2 by FDA under an Emergency Use Authorization (EUA). This EUA will remain in effect (meaning this test can be used) for the duration of the COVID-19 declaration under Section 564(b)(1) of the Act, 21 U.S.C. section 360bbb-3(b)(1), unless the authorization is terminated or revoked.  Performed at [facility], [place] [address] [place], [place] [address]   Expectorated Sputum Assessment w Gram Stain, Rflx to Resp Cult     Status: None   Collection Time: 03/25/22  8:28 AM   Specimen: Expectorated Sputum  Result Value Ref Range Status   Specimen Description EXPECTORATED SPUTUM  Final   Special Requests NONE  Final   Sputum evaluation   Final    THIS SPECIMEN IS ACCEPTABLE FOR SPUTUM CULTURE Performed at [facility], [place] [address] [place], [place] [address]    Report Status 03/25/2022 FINAL  Final  Culture, Respiratory w Gram Stain     Status: None (Preliminary result)   Collection Time: 03/25/22  8:28 AM  Result Value Ref Range Status   Specimen Description   Final    EXPECTORATED SPUTUM Performed at [place] [address], [place], [place] [address]    Special Requests   Final    NONE Reflexed from [id number] Performed at [facility], [place] [address], [place], [place] [address]    Gram Stain   Final    RARE WBC PRESENT, PREDOMINANTLY PMN RARE GRAM POSITIVE COCCI Performed at [facility], [place] [address],  [place],  [address]    Culture PENDING  Incomplete   Report Status PENDING  Incomplete         Radiology Studies: DG Chest Port 1 View  Result Date: 03/25/2022 CLINICAL DATA:  Shortness of breath.  History of bronchitis.  Cough. EXAM: PORTABLE CHEST 1 VIEW COMPARISON:  01/31/2022 FINDINGS: Stable cardiac enlargement. Lung volumes are low. No pleural effusion or edema. No airspace opacities identified. IMPRESSION: Low lung volumes.  No acute findings. Electronically Signed   By: Kerby Moors M.D.   On: 03/25/2022 05:53        Scheduled Meds:  bumetanide  1 mg Oral Daily   busPIRone  10 mg Oral BID  Chlorhexidine Gluconate Cloth  6 each Topical Daily   dofetilide  500 mcg Oral BID   enoxaparin (LOVENOX) injection  200 mg Subcutaneous BID   insulin aspart  0-20 Units Subcutaneous Q4H   ipratropium  0.5 mg Nebulization Q6H   levalbuterol  1.25 mg Nebulization Q6H   methylPREDNISolone (SOLU-MEDROL) injection  80 mg Intravenous Q12H   pantoprazole  40 mg Oral BID   potassium chloride SA  20 mEq Oral Daily   pregabalin  100 mg Oral BID   propranolol  60 mg Oral TID   sodium chloride flush  3 mL Intravenous Q12H   spironolactone  25 mg Oral Daily   Continuous Infusions:  azithromycin (ZITHROMAX) 500 mg in sodium chloride 0.9 % 250 mL IVPB Stopped (03/25/22 1049)     LOS: 1 day   Time spent= 35 mins    Edelmira Gallogly Arsenio Loader, MD [facility]  If 7PM-7AM, please contact night-coverage  03/26/2022, 7:21 AM

## 2022-03-26 NOTE — Progress Notes (Signed)
Pt stable at time of transfer. Report given & belongings collected

## 2022-03-26 NOTE — Progress Notes (Signed)
ANTICOAGULATION CONSULT NOTE - Initial Consult  Pharmacy Consult for apixaban Indication: atrial fibrillation  Allergies  Allergen Reactions   Gabapentin Anaphylaxis, Nausea And Vomiting and Other (See Comments)    "Cannot walk"   Topiramate Other (See Comments)    Jaw tightness and chest discomfort    Albuterol Other (See Comments)    CAN tolerate levalbuterol   Hydroxyzine Other (See Comments)    QT prolongation related   Imitrex [Sumatriptan] Nausea And Vomiting and Other (See Comments)    Increased heart rate   Latex Itching   Montelukast Other (See Comments)    Nightmares    Other Itching and Other (See Comments)    Plastic and artificial Christmas trees = Itching   Prozac [Fluoxetine] Other (See Comments)    Bad, vivid dreams   Trazodone And Nefazodone Other (See Comments)    Bad dreams 01/27/20 Pt is unsure if allergic to Nefazodone   Advair Diskus [Fluticasone-Salmeterol] Palpitations   Copper-Containing Compounds Rash and Other (See Comments)    Makes the face burn, also    Patient Measurements: Height: 5\' 5"  (165.1 cm) Weight: (!) 196.2 kg (432 lb 8.7 oz) IBW/kg (Calculated) : 57  Vital Signs: Temp: 98 F (36.7 C) (01/17 1157) Temp Source: Oral (01/17 1157) BP: 134/60 (01/17 1100) Pulse Rate: 83 (01/17 1100)  Labs: Recent Labs    03/25/22 0436 03/25/22 0606 03/25/22 0850 03/26/22 0251  HGB 10.8*  --  11.4* 10.4*  HCT 37.1  --  38.7 34.5*  PLT 177  --  179 163  CREATININE 0.91  --  0.89 0.82  TROPONINIHS 4 5  --   --     Estimated Creatinine Clearance: 144.4 mL/min (by C-G formula based on SCr of 0.82 mg/dL).   Medical History: Past Medical History:  Diagnosis Date   Anxiety    Asthma    Atrial fibrillation ([place])    Depression    DM (diabetes mellitus), type 2 ([place]) 02/16/2022   Dysrhythmia    new onset Afib rvr   GERD (gastroesophageal reflux disease)    Heart failure (HCC)    Heel spur    bilat   History of bronchitis    History  of chicken pox    History of urinary tract infection    Migraines    Plantar fasciitis    bilat   STD (sexually transmitted disease)    chl hx & hsv 1&2   Tremors of nervous system    Urinary incontinence     Medications: Apixaban 5 mg PO BID PTA for afib  Assessment: Pt is a 73 yoF who is chronically anticoagulated with apixaban for atrial fibrillation. Anticoagulation was transitioned to therapeutic LMWH on admission. Pharmacy consulted to resume home apixaban dose today.   Today, 03/26/22 CBC: Hgb slightly low, Plt WNL SCr WNL and stable Last dose of enoxaparin given on 1/17 @ 1026  Plan:  Discontinue enoxaparin Resume apixaban 5 mg PO BID tonight  Pharmacy to sign off. Please re-consult if needed.   Lenis Noon, PharmD 03/26/2022,12:08 PM

## 2022-03-27 ENCOUNTER — Ambulatory Visit: Payer: Medicaid Other

## 2022-03-27 ENCOUNTER — Ambulatory Visit: Payer: Medicaid Other | Admitting: Occupational Therapy

## 2022-03-27 DIAGNOSIS — J9601 Acute respiratory failure with hypoxia: Secondary | ICD-10-CM | POA: Diagnosis not present

## 2022-03-27 LAB — BASIC METABOLIC PANEL
Anion gap: 8 (ref 5–15)
BUN: 18 mg/dL (ref 6–20)
CO2: 27 mmol/L (ref 22–32)
Calcium: 9.2 mg/dL (ref 8.9–10.3)
Chloride: 101 mmol/L (ref 98–111)
Creatinine, Ser: 0.85 mg/dL (ref 0.44–1.00)
GFR, Estimated: >60 mL/min (ref >60)
Glucose, Bld: 174 mg/dL — ABNORMAL HIGH (ref 70–99)
Potassium: 4.4 mmol/L (ref 3.5–5.1)
Sodium: 136 mmol/L (ref 135–145)

## 2022-03-27 LAB — CBC
HCT: 38.1 % (ref 36.0–46.0)
Hemoglobin: 11.1 g/dL — ABNORMAL LOW (ref 12.0–15.0)
MCH: 26.6 pg (ref 26.0–34.0)
MCHC: 29.1 g/dL — ABNORMAL LOW (ref 30.0–36.0)
MCV: 91.4 fL (ref 80.0–100.0)
Platelets: 164 10*3/uL (ref 150–400)
RBC: 4.17 MIL/uL (ref 3.87–5.11)
RDW: 17.8 % — ABNORMAL HIGH (ref 11.5–15.5)
WBC: 8.3 10*3/uL (ref 4.0–10.5)
nRBC: 0 % (ref 0.0–0.2)

## 2022-03-27 LAB — MRSA NEXT GEN BY PCR, NASAL: MRSA by PCR Next Gen: NOT DETECTED

## 2022-03-27 LAB — GLUCOSE, CAPILLARY
Glucose-Capillary: 139 mg/dL — ABNORMAL HIGH (ref 70–99)
Glucose-Capillary: 133 mg/dL — ABNORMAL HIGH (ref 70–99)
Glucose-Capillary: 193 mg/dL — ABNORMAL HIGH (ref 70–99)
Glucose-Capillary: 140 mg/dL — ABNORMAL HIGH (ref 70–99)

## 2022-03-27 LAB — MAGNESIUM: Magnesium: 2.3 mg/dL (ref 1.7–2.4)

## 2022-03-27 MED ORDER — METHYLPREDNISOLONE SODIUM SUCC 40 MG IJ SOLR
40.0000 mg | Freq: Every day | INTRAMUSCULAR | Status: DC
  Administered 2022-03-28: 40 mg via INTRAVENOUS
  Filled 2022-03-27: qty 1

## 2022-03-27 MED ORDER — IPRATROPIUM BROMIDE 0.02 % IN SOLN
0.5000 mg | Freq: Three times a day (TID) | RESPIRATORY_TRACT | Status: DC
  Administered 2022-03-27 – 2022-03-28 (×3): 0.5 mg via RESPIRATORY_TRACT
  Filled 2022-03-27 (×3): qty 2.5

## 2022-03-27 MED ORDER — LEVALBUTEROL HCL 1.25 MG/0.5ML IN NEBU
1.2500 mg | INHALATION_SOLUTION | Freq: Three times a day (TID) | RESPIRATORY_TRACT | Status: DC
  Administered 2022-03-27 – 2022-03-28 (×3): 1.25 mg via RESPIRATORY_TRACT
  Filled 2022-03-27 (×3): qty 0.5

## 2022-03-27 NOTE — Evaluation (Signed)
Occupational Therapy Evaluation Patient Details Name: Alyssa Blanchard MRN: [id number] DOB: 02/07/1971 Today's Date: 03/27/2022   History of Present Illness 52 year old with history of asthma, hypoventilation syndrome secondary to obesity, A-fib on Eliquis, DM 2, diastolic CHF, eczema has been experiencing shortness of breath, productive cough and chest discomfort 1 week. Admitted for acute respiratory failure with hypoxia. secondary to bacterial pneumonia and RSV.   Clinical Impression   Ms. Alyssa Blanchard is a 52 year old woman who demonstrates independence with functional mobility using walker and ability to perform baseline ADLs. She has no acute care OT needs. Recommend continue OP therapy at discharge.      Recommendations for follow up therapy are one component of a multi-disciplinary discharge planning process, led by the attending physician.  Recommendations may be updated based on patient status, additional functional criteria and insurance authorization.   Follow Up Recommendations  Outpatient OT     Assistance Recommended at Discharge PRN  Patient can return home with the following Assistance with cooking/housework    Functional Status Assessment  Patient has not had a recent decline in their functional status  Equipment Recommendations  None recommended by OT    Recommendations for Other Services       Precautions / Restrictions Precautions Precautions: None      Mobility Bed Mobility Overal bed mobility: Modified Independent                  Transfers Overall transfer level: Modified independent                        Balance Overall balance assessment: Mild deficits observed, not formally tested                                         ADL either performed or assessed with clinical judgement   ADL Overall ADL's : At baseline                                             Vision Baseline  Vision/History: 1 Wears glasses Ability to See in Adequate Light: 1 Impaired Patient Visual Report: No change from baseline       Perception     Praxis      Pertinent Vitals/Pain Pain Assessment Pain Assessment: No/denies pain     Hand Dominance Right   Extremity/Trunk Assessment Upper Extremity Assessment Upper Extremity Assessment: Overall WFL for tasks assessed   Lower Extremity Assessment Lower Extremity Assessment: Defer to PT evaluation   Cervical / Trunk Assessment Cervical / Trunk Assessment: Normal   Communication Communication Communication: No difficulties   Cognition Arousal/Alertness: Awake/alert Behavior During Therapy: WFL for tasks assessed/performed Overall Cognitive Status: Within Functional Limits for tasks assessed                                       General Comments       Exercises     Shoulder Instructions      Home Living Family/patient expects to be discharged to:: Private residence Living Arrangements: Parent Available Help at Discharge: Family Type of Home: House Home Access: Stairs to enter [employer] of Steps: 4 Entrance  Stairs-Rails: Left;Right Home Layout: One level     Bathroom Shower/Tub: Teacher, early years/pre: Handicapped height Bathroom Accessibility: Yes   Home Equipment: Conservation officer, nature (2 wheels);Shower seat;BSC/3in1;Hospital bed   Additional Comments: Pt reports she lives with her mom. her mother is in poor health as well and has stage IV CKD, pt's mother can not help pt. they also have a cat who the pt's mom cares for.  Lives With: Family    Prior Functioning/Environment Prior Level of Function : Independent/Modified Independent             Mobility Comments: using a cane in home, RW out of home ADLs Comments: Not yet been able to get in the shower -- but mod I for ADLs        OT Problem List:        OT Treatment/Interventions:      OT Goals(Current goals  can be found in the care plan section) Acute Rehab OT Goals OT Goal Formulation: Patient unable to participate in goal setting  OT Frequency:      Co-evaluation              AM-PAC OT "6 Clicks" Daily Activity     Outcome Measure Help from another person eating meals?: None Help from another person taking care of personal grooming?: None Help from another person toileting, which includes using toliet, bedpan, or urinal?: None Help from another person bathing (including washing, rinsing, drying)?: None Help from another person to put on and taking off regular upper body clothing?: None Help from another person to put on and taking off regular lower body clothing?: None 6 Click Score: 24   End of Session Equipment Utilized During Treatment: Rolling walker (2 wheels) Nurse Communication: Mobility status  Activity Tolerance: Patient tolerated treatment well Patient left: in bed;with call bell/phone within reach                   Time: 1230-1247 OT Time Calculation (min): 17 min Charges:  OT General Charges $OT Visit: 1 Visit OT Evaluation $OT Eval Low Complexity: 1 Low  Gustavo Lah, OTR/L [place]  Office [phone]   Lenward Chancellor 03/27/2022, 1:06 PM

## 2022-03-27 NOTE — Progress Notes (Signed)
PROGRESS NOTE    Alyssa Blanchard  OAC:[id number] DOB: March 10, 1971 DOA: 03/25/2022 PCP: Lenox Ponds, MD   Brief Narrative:  52 year old with history of asthma, hypoventilation syndrome secondary to obesity, A-fib on Eliquis, DM 2, diastolic CHF, eczema has been experiencing shortness of breath, productive cough and chest discomfort 1 week prior to ED arrival.  Was treated outpatient for bronchitis with doxycycline which gave her diarrhea.  Got admitted to the hospital due to worsening shortness of breath and hypoxia.  Chest x-ray showed low lung volumes, respiratory panel was positive for RSV.  Initially patient had to be placed on BiPAP.  BiPAP has now been weaned off.   Assessment & Plan:   Acute hypoxemic respiratory failure secondary to acute RSV Underlying asthma and obesity hypoventilation syndrome Suspected superimposed secondary bacterial pneumonia/bronchitis RSV infection leading to severe asthma exacerbation which is complicated by obesity hypoventilation syndrome.  She is currently on empiric azithromycin.  Procalcitonin 0.14, BNP 104.  Continue Solu-Medrol, aggressive bronchodilators scheduled and as needed, I-S/flutter valve.  Supplemental oxygen.  Patient has been weaned off BiPAP now on room air.  Will reduce the dose of steroids Urine strep-negative    Atrial fibrillation on Eliquis Patient is on Tikosyn and Inderal Will DC Lovenox and resume Eliquis    History of CHF, diastolic Continue preadmission Bumex with Aldactone and potassium   Diabetes mellitus 2 with neuropathy Sliding scale and Accu-Cheks.  Ozempic currently on hold. 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  On Ozempic at home     DVT prophylaxis: On Eliquis Code Status: Full code Family Communication: None  Status is: Inpatient Shortness of breath improved, will reduce steroids.  PT/OT.  If continues to do well, anticipate discharge tomorrow   Subjective: Feels much better today.  At rest her shortness of breath is significantly improved.  We were able to wean her off oxygen.  Examination:  Constitutional: Not in acute distress Respiratory: very minimal rhonchi Cardiovascular: Normal sinus rhythm, no rubs Abdomen: Nontender nondistended good bowel sounds Musculoskeletal: No edema noted Skin: No rashes seen Neurologic: CN 2-12 grossly intact.  And nonfocal Psychiatric: Normal judgment and insight. Alert and oriented x 3. Normal mood.     Objective: Vitals:   03/27/22 0500 03/27/22 0634 03/27/22 0746 03/27/22 0749  BP:  136/67    Pulse:  74    Resp:  20    Temp:  98.1 F (36.7 C)    TempSrc:      SpO2:  97% 100% 100%  Weight: (!) 196.4 kg     Height:        Intake/Output Summary (Last 24 hours) at 03/27/2022 1054 Last data filed at 03/27/2022 0215 Gross per 24 hour  Intake 2649.86 ml  Output 2350 ml  Net 299.86 ml   Filed Weights   03/25/22 1322 03/27/22 0500  Weight: (!) 196.2 kg (!) 196.4 kg     Data Reviewed:   CBC: Recent Labs  Lab 03/25/22 0436 03/25/22 0850 03/26/22 0251 03/27/22 0515  WBC 7.5 7.7 7.3 8.3  NEUTROABS 5.3  --   --   --   HGB 10.8* 11.4* 10.4* 11.1*  HCT 37.1  38.7 34.5* 38.1  MCV 92.1 90.8 90.1 91.4  PLT 177 179 163 024   Basic Metabolic Panel: Recent Labs  Lab 03/25/22 0436 03/25/22 0606 03/25/22 0850 03/26/22 0251 03/27/22 0515  NA 135  --   --  135 136  K 3.7  --   --  4.3 4.4  CL 103  --   --  100 101  CO2 24  --   --  25 27  GLUCOSE 148*  --   --  201* 174*  BUN 8  --   --  10 18  CREATININE 0.91  --  0.89 0.82 0.85  CALCIUM 8.6*  --   --  8.9 9.2  MG  --  2.2   --   --  2.3  PHOS  --  4.5  --   --   --    GFR: Estimated Creatinine Clearance: 139.4 mL/min (by C-G formula based on SCr of 0.85 mg/dL). Liver Function Tests: Recent Labs  Lab 03/26/22 0251  AST 23  ALT 17  ALKPHOS 55  BILITOT 0.5  PROT 6.9  ALBUMIN 3.6   No results for input(s): "LIPASE", "AMYLASE" in the last 168 hours. No results for input(s): "AMMONIA" in the last 168 hours. Coagulation Profile: No results for input(s): "INR", "PROTIME" in the last 168 hours. Cardiac Enzymes: No results for input(s): "CKTOTAL", "CKMB", "CKMBINDEX", "TROPONINI" in the last 168 hours. BNP (last 3 results) No results for input(s): "PROBNP" in the last 8760 hours. HbA1C: Recent Labs    03/25/22 0850  HGBA1C 5.5   CBG: Recent Labs  Lab 03/26/22 1151 03/26/22 1729 03/26/22 1736 03/26/22 2142 03/27/22 0751  GLUCAP 193* 188* 181* 191* 139*   Lipid Profile: No results for input(s): "CHOL", "HDL", "LDLCALC", "TRIG", "CHOLHDL", "LDLDIRECT" in the last 72 hours. Thyroid Function Tests: No results for input(s): "TSH", "T4TOTAL", "FREET4", "T3FREE", "THYROIDAB" in the last 72 hours. Anemia Panel: No results for input(s): "VITAMINB12", "FOLATE", "FERRITIN", "TIBC", "IRON", "RETICCTPCT" in the last 72 hours. Sepsis Labs: Recent Labs  Lab 03/25/22 0850  PROCALCITON 0.14    Recent Results (from the past 240 hour(s))  Resp panel by RT-PCR (RSV, Flu A&B, Covid) Anterior Nasal Swab     Status: Abnormal   Collection Time: 03/25/22  4:21 AM   Specimen: Anterior Nasal Swab  Result Value Ref Range Status   SARS Coronavirus 2 by RT PCR NEGATIVE NEGATIVE Final    Comment: (NOTE) SARS-CoV-2 target nucleic acids are NOT DETECTED.  The SARS-CoV-2 RNA is generally detectable in upper respiratory specimens during the acute phase of infection. The lowest concentration of SARS-CoV-2 viral copies this assay can detect is 138 copies/mL. A negative result does not preclude SARS-Cov-2 infection and  should not be used as the sole basis for treatment or other patient management decisions. A negative result may occur with  improper specimen collection/handling, submission of specimen other than nasopharyngeal swab, presence of viral mutation(s) within the areas targeted by this assay, and inadequate number of viral copies(<138 copies/mL). A negative result must be combined with clinical observations, patient history, and epidemiological information. The expected result is Negative.  Fact Sheet for Patients:  EntrepreneurPulse.com.au  Fact Sheet for Healthcare Providers:  IncredibleEmployment.be  This test is no t yet approved or cleared by the [place] FDA and  has been authorized for detection and/or diagnosis of SARS-CoV-2 by FDA under an Emergency Use Authorization (EUA). This EUA will remain  in effect (meaning this test can be used)  for the duration of the COVID-19 declaration under Section 564(b)(1) of the Act, 21 U.S.C.section 360bbb-3(b)(1), unless the authorization is terminated  or revoked sooner.       Influenza A by PCR NEGATIVE NEGATIVE Final   Influenza B by PCR NEGATIVE NEGATIVE Final    Comment: (NOTE) The Xpert Xpress SARS-CoV-2/FLU/RSV plus assay is intended as an aid in the diagnosis of influenza from Nasopharyngeal swab specimens and should not be used as a sole basis for treatment. Nasal washings and aspirates are unacceptable for Xpert Xpress SARS-CoV-2/FLU/RSV testing.  Fact Sheet for Patients: EntrepreneurPulse.com.au  Fact Sheet for Healthcare Providers: IncredibleEmployment.be  This test is not yet approved or cleared by the [place] FDA and has been authorized for detection and/or diagnosis of SARS-CoV-2 by FDA under an Emergency Use Authorization (EUA). This EUA will remain in effect (meaning this test can be used) for the duration of the COVID-19 declaration  under Section 564(b)(1) of the Act, 21 U.S.C. section 360bbb-3(b)(1), unless the authorization is terminated or revoked.     Resp Syncytial Virus by PCR POSITIVE (A) NEGATIVE Final    Comment: (NOTE) Fact Sheet for Patients: EntrepreneurPulse.com.au  Fact Sheet for Healthcare Providers: IncredibleEmployment.be  This test is not yet approved or cleared by the [place] FDA and has been authorized for detection and/or diagnosis of SARS-CoV-2 by FDA under an Emergency Use Authorization (EUA). This EUA will remain in effect (meaning this test can be used) for the duration of the COVID-19 declaration under Section 564(b)(1) of the Act, 21 U.S.C. section 360bbb-3(b)(1), unless the authorization is terminated or revoked.  Performed at [facility], [place] [address] [place], [place] [address]   Expectorated Sputum Assessment w Gram Stain, Rflx to Resp Cult     Status: None   Collection Time: 03/25/22  8:28 AM   Specimen: Expectorated Sputum  Result Value Ref Range Status   Specimen Description EXPECTORATED SPUTUM  Final   Special Requests NONE  Final   Sputum evaluation   Final    THIS SPECIMEN IS ACCEPTABLE FOR SPUTUM CULTURE Performed at [facility] [place] [address], [place], [place] [address]    Report Status 03/25/2022 FINAL  Final  Culture, Respiratory w Gram Stain     Status: None (Preliminary result)   Collection Time: 03/25/22  8:28 AM  Result Value Ref Range Status   Specimen Description   Final    EXPECTORATED SPUTUM Performed at [facility] [address] [place], [place] [address]    Special Requests   Final    NONE Reflexed from [phone] Performed at [facility], [place] [address] [place], [place] [address]    Gram Stain   Final    RARE WBC PRESENT, PREDOMINANTLY PMN RARE GRAM POSITIVE COCCI    Culture   Final    TOO YOUNG TO READ Performed at  [facility], [place] [address], [place],  [address]    Report Status PENDING  Incomplete         Radiology Studies: No results found.      Scheduled Meds:  apixaban  5 mg Oral BID   benzonatate  200 mg Oral TID   budesonide (PULMICORT) nebulizer solution  0.5 mg Nebulization BID   bumetanide  1 mg Oral Daily   busPIRone  10 mg Oral BID   Chlorhexidine Gluconate Cloth  6 each Topical Daily   dofetilide  500 mcg Oral BID   insulin aspart  0-15 Units Subcutaneous TID WC   insulin aspart  0-5 Units Subcutaneous QHS   insulin aspart  2 Units Subcutaneous TID WC   insulin glargine-yfgn  7 Units Subcutaneous Daily   ipratropium  0.5 mg Nebulization TID   levalbuterol  1.25 mg Nebulization TID   loratadine  10 mg Oral Daily   methylPREDNISolone (SOLU-MEDROL) injection  40 mg Intravenous Q12H   pantoprazole  40 mg Oral BID   potassium chloride SA  20 mEq Oral Daily   pregabalin  100 mg Oral BID   propranolol  60 mg Oral TID   sodium chloride flush  3 mL Intravenous Q12H   spironolactone  25 mg Oral Daily   Continuous Infusions:  azithromycin (ZITHROMAX) 500 mg in sodium chloride 0.9 % 250 mL IVPB 500 mg (03/27/22 0957)     LOS: 2 days   Time spent= 35 mins    Ermalinda Joubert Joline Maxcy, MD [facility]  If 7PM-7AM, please contact night-coverage  03/27/2022, 10:54 AM

## 2022-03-27 NOTE — Evaluation (Signed)
Physical Therapy Evaluation Patient Details Name: Alyssa Blanchard MRN: [id number] DOB: [birth year] Today's Date: 03/27/2022  History of Present Illness  52 year old with history of asthma, hypoventilation syndrome secondary to obesity, A-fib on Eliquis, DM 2, diastolic CHF, eczema has been experiencing shortness of breath, productive cough and chest discomfort 1 week. Admitted for acute respiratory failure with hypoxia. secondary to bacterial pneumonia and RSV.   Clinical Impression  Pt is a 52 y.o. female resulting in the deficits listed below (see PT Problem List). Pt was recently admitted to hospital and reports she was discharged to acute inpatient rehab and has been participating in [place] and reports she has made great progress with therapy. Pt is MOD I for all mobility and ambulated 48ft with use of RW. Discussed importance of continued mobility with nursing staff in order to decrease effects of hospital stay. No further skilled PT needs identified. PT will sign off. Please reconsult if mobility status changes.         Recommendations for follow up therapy are one component of a multi-disciplinary discharge planning process, led by the attending physician.  Recommendations may be updated based on patient status, additional functional criteria and insurance authorization.  Follow Up Recommendations Outpatient PT (resume OPPT)      Assistance Recommended at Discharge PRN  Patient can return home with the following       Equipment Recommendations None recommended by PT  Recommendations for Other Services       Functional Status Assessment Patient has had a recent decline in their functional status and demonstrates the ability to make significant improvements in function in a reasonable and predictable amount of time.     Precautions / Restrictions Precautions Precautions: None Restrictions Weight Bearing Restrictions: No      Mobility  Bed Mobility                General bed mobility comments: HOB elevated, use of bed rails to assist    Transfers   Equipment used: Rolling walker (2 wheels) (bariatric)                    Ambulation/Gait Ambulation/Gait assistance: Modified independent (Device/Increase time) Gait Distance (Feet): 30 Feet Assistive device: Rolling walker (2 wheels) (bariatric) Gait Pattern/deviations: Step-through pattern, Wide base of support Gait velocity: decreased     General Gait Details: no overt LOB observed. Pt has been doing OPPT after being discharged from acute rehab and reports she has progressed well.  Stairs            Wheelchair Mobility    Modified Rankin (Stroke Patients Only)       Balance                                             Pertinent Vitals/Pain Pain Assessment Pain Assessment: No/denies pain    Home Living Family/patient expects to be discharged to:: Private residence Living Arrangements: Parent Available Help at Discharge: Family Type of Home: House Home Access: Stairs to enter Entrance Stairs-Rails: Chemical engineer of Steps: 4   Home Layout: One level Home Equipment: Conservation officer, nature (2 wheels);Shower seat;BSC/3in1;Hospital bed Additional Comments: Pt reports she lives with her mom. her mother is in poor health as well and has stage IV CKD, pt's mother can not help pt. they also have a cat who the pt's mom cares for.  Prior Function Prior Level of Function : Independent/Modified Independent             Mobility Comments: using a cane in home, RW out of home ADLs Comments: Not yet been able to get in the shower -- but mod I for ADLs     Hand Dominance   Dominant Hand: Right    Extremity/Trunk Assessment   Upper Extremity Assessment Upper Extremity Assessment: Defer to OT evaluation    Lower Extremity Assessment Lower Extremity Assessment: LLE deficits/detail;RLE deficits/detail RLE Deficits / Details: grossly  4/5, except knee flexion 4-/5 LLE Deficits / Details: grossly 4/5, except knee flexion 4-/5    Cervical / Trunk Assessment Cervical / Trunk Assessment: Normal  Communication   Communication: No difficulties  Cognition Arousal/Alertness: Awake/alert Behavior During Therapy: WFL for tasks assessed/performed Overall Cognitive Status: Within Functional Limits for tasks assessed                                          General Comments      Exercises     Assessment/Plan    PT Assessment Patient does not need any further PT services  PT Problem List         PT Treatment Interventions      PT Goals (Current goals can be found in the Care Plan section)  Acute Rehab PT Goals Patient Stated Goal: Be able to stand in the shower longer PT Goal Formulation: With patient Time For Goal Achievement: 04/10/22 Potential to Achieve Goals: Good    Frequency       Co-evaluation PT/OT/SLP Co-Evaluation/Treatment: Yes Reason for Co-Treatment: For patient/therapist safety;To address functional/ADL transfers           AM-PAC PT "6 Clicks" Mobility  Outcome Measure Help needed turning from your back to your side while in a flat bed without using bedrails?: None Help needed moving from lying on your back to sitting on the side of a flat bed without using bedrails?: None Help needed moving to and from a bed to a chair (including a wheelchair)?: None Help needed standing up from a chair using your arms (e.g., wheelchair or bedside chair)?: None Help needed to walk in hospital room?: None Help needed climbing 3-5 steps with a railing? : A Little 6 Click Score: 23    End of Session   Activity Tolerance: Patient tolerated treatment well Patient left: in bed;with call bell/phone within reach Nurse Communication: Mobility status (PT to sign off. Bari RW left in room for pt to use during stay.) PT Visit Diagnosis: Muscle weakness (generalized) (M62.81)    Time:  6301-6010 PT Time Calculation (min) (ACUTE ONLY): 16 min   Charges:   PT Evaluation $PT Eval Low Complexity: 1 Low          Festus Barren PT, DPT  Acute Rehabilitation Services  Office [phone]   03/27/2022, 2:35 PM

## 2022-03-28 ENCOUNTER — Ambulatory Visit: Payer: Medicaid Other

## 2022-03-28 DIAGNOSIS — J9601 Acute respiratory failure with hypoxia: Secondary | ICD-10-CM | POA: Diagnosis not present

## 2022-03-28 LAB — CULTURE, RESPIRATORY W GRAM STAIN: Culture: NORMAL

## 2022-03-28 LAB — CBC
HCT: 40 % (ref 36.0–46.0)
Hemoglobin: 11.7 g/dL — ABNORMAL LOW (ref 12.0–15.0)
MCH: 26.5 pg (ref 26.0–34.0)
MCHC: 29.3 g/dL — ABNORMAL LOW (ref 30.0–36.0)
MCV: 90.7 fL (ref 80.0–100.0)
Platelets: 185 10*3/uL (ref 150–400)
RBC: 4.41 MIL/uL (ref 3.87–5.11)
RDW: 17.8 % — ABNORMAL HIGH (ref 11.5–15.5)
WBC: 8.7 10*3/uL (ref 4.0–10.5)
nRBC: 0 % (ref 0.0–0.2)

## 2022-03-28 LAB — BASIC METABOLIC PANEL
Anion gap: 13 (ref 5–15)
BUN: 23 mg/dL — ABNORMAL HIGH (ref 6–20)
CO2: 28 mmol/L (ref 22–32)
Calcium: 9.1 mg/dL (ref 8.9–10.3)
Chloride: 97 mmol/L — ABNORMAL LOW (ref 98–111)
Creatinine, Ser: 0.9 mg/dL (ref 0.44–1.00)
GFR, Estimated: >60 mL/min (ref >60)
Glucose, Bld: 87 mg/dL (ref 70–99)
Potassium: 3.5 mmol/L (ref 3.5–5.1)
Sodium: 138 mmol/L (ref 135–145)

## 2022-03-28 LAB — GLUCOSE, CAPILLARY: Glucose-Capillary: 91 mg/dL (ref 70–99)

## 2022-03-28 LAB — MAGNESIUM: Magnesium: 2.5 mg/dL — ABNORMAL HIGH (ref 1.7–2.4)

## 2022-03-28 MED ORDER — POTASSIUM CHLORIDE CRYS ER 20 MEQ PO TBCR
40.0000 meq | EXTENDED_RELEASE_TABLET | Freq: Once | ORAL | Status: AC
  Administered 2022-03-28: 40 meq via ORAL
  Filled 2022-03-28: qty 2

## 2022-03-28 MED ORDER — IPRATROPIUM BROMIDE 0.02 % IN SOLN: 0.5000 mg | Freq: Three times a day (TID) | RESPIRATORY_TRACT | 0 refills | Status: DC | PRN

## 2022-03-28 MED ORDER — LEVALBUTEROL HCL 1.25 MG/0.5ML IN NEBU: 1.2500 mg | INHALATION_SOLUTION | Freq: Two times a day (BID) | RESPIRATORY_TRACT | Status: DC

## 2022-03-28 MED ORDER — BENZONATATE 200 MG PO CAPS: 200.0000 mg | ORAL_CAPSULE | Freq: Three times a day (TID) | ORAL | 0 refills | Status: DC | PRN

## 2022-03-28 MED ORDER — POTASSIUM CHLORIDE CRYS ER 20 MEQ PO TBCR: 20.0000 meq | EXTENDED_RELEASE_TABLET | Freq: Every day | ORAL | 0 refills | Status: DC

## 2022-03-28 MED ORDER — LEVALBUTEROL HCL 1.25 MG/0.5ML IN NEBU: 1.2500 mg | INHALATION_SOLUTION | Freq: Three times a day (TID) | RESPIRATORY_TRACT | 0 refills | Status: DC | PRN

## 2022-03-28 MED ORDER — IPRATROPIUM BROMIDE 0.02 % IN SOLN: 0.5000 mg | Freq: Two times a day (BID) | RESPIRATORY_TRACT | Status: DC

## 2022-03-28 MED ORDER — PREDNISONE 10 MG PO TABS: 10.0000 mg | ORAL_TABLET | Freq: Every day | ORAL | 0 refills | Status: AC

## 2022-03-28 MED ORDER — AZITHROMYCIN 500 MG PO TABS: 500.0000 mg | ORAL_TABLET | Freq: Every day | ORAL | 0 refills | Status: AC

## 2022-03-28 NOTE — Plan of Care (Signed)

## 2022-03-28 NOTE — Discharge Summary (Signed)
Physician Discharge Summary  Baltazar NajjarDawn Renee Puzzo VWU:[id number] DOB: 13-Feb-1971 DOA: 03/25/2022  PCP: Lenox PondsSilva Zapata, Edwin, MD  Admit date: 03/25/2022 Discharge date: 03/28/2022  Admitted From: Home Disposition: Home  Recommendations for Outpatient Follow-up:  Follow up with PCP in 1 week.  Repeat blood work-CMP/CBC/magnesium and EKG within the next 5 to 7 days with the PCP. Azithromycin for 2 more days.  Tells me she is unable to tolerate doxycycline Ipratropium and Xopenex nebulizer medication prescribed.  She states she already has a nebulizer machine at home. P.o. prednisone prescribed, 2 days   Discharge Condition: Stable CODE STATUS: Full code Diet recommendation: Heart healthy  Brief/Interim Summary: 52 year old with history of asthma, hypoventilation syndrome secondary to obesity, A-fib on Eliquis, DM 2, diastolic CHF, eczema has been experiencing shortness of breath, productive cough and chest discomfort 1 week prior to ED arrival.  Was treated outpatient for bronchitis with doxycycline which gave her diarrhea.  Got admitted to the hospital due to worsening shortness of breath and hypoxia.  Chest x-ray showed low lung volumes, respiratory panel was positive for RSV.  Initially patient had to be placed on BiPAP.  BiPAP has now been weaned off.  Over the course of 3 days in the hospital patient did significantly well.  Today she is medically stable for discharge.     Assessment & Plan:   Acute hypoxemic respiratory failure secondary to acute RSV Underlying asthma and obesity hypoventilation syndrome Suspected superimposed secondary bacterial pneumonia/bronchitis RSV infection leading to severe asthma exacerbation which is complicated by obesity hypoventilation syndrome.  She is currently on empiric azithromycin.  Procalcitonin 0.14, BNP 104.  Urine strep negative.  Patient weaned off BiPAP.  Ambulatory pulse ox remains greater than 90% without any shortness of breath.  Will transition  to oral prednisone for 2 more days.  2 more days of oral azithromycin given, she is not able to tolerate doxycycline.  Her home bronchodilators have been refilled.     Atrial fibrillation on Eliquis Patient is on Tikosyn and Inderal Continue her home Eliquis.  Recommend repeat EKG with outpatient PCP in next 1 week to ensure QTc remains stable     History of CHF, diastolic Continue preadmission Bumex with Aldactone and potassium   Diabetes mellitus 2 with neuropathy Resume all of her home medications.   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  On Ozempic at home        Discharge Diagnoses:  Principal Problem:   Acute respiratory failure with hypoxia (HCC)      Consultations: None  Subjective: Feels great no complaints.  She wishes to go home.  Discharge Exam: Vitals:   03/28/22 0505 03/28/22 0807  BP: 130/80   Pulse: 63   Resp: 12   Temp: 98.1 F (36.7 C)   SpO2: 95% 98%   Vitals:   03/27/22 2215 03/28/22 0500 03/28/22 0505 03/28/22 0807  BP:   130/80   Pulse:   63   Resp:   12   Temp:   98.1 F (36.7 C)   TempSrc:   Oral   SpO2: 95%  95% 98%  Weight:  (!) 191.6 kg    Height:        General: Pt is alert, awake, not  in acute distress Cardiovascular: RRR, S1/S2 +, no rubs, no gallops Respiratory: CTA bilaterally, no wheezing, no rhonchi Abdominal: Soft, NT, ND, bowel sounds + Extremities: no edema, no cyanosis  Discharge Instructions   Allergies as of 03/28/2022       Reactions   Gabapentin Anaphylaxis, Nausea And Vomiting, Other (See Comments)   "Cannot walk"   Topiramate Other (See Comments)   Jaw tightness and chest discomfort    Albuterol Other (See Comments)   CAN tolerate  levalbuterol   Hydroxyzine Other (See Comments)   QT prolongation related   Imitrex [sumatriptan] Nausea And Vomiting, Other (See Comments)   Increased heart rate   Latex Itching   Montelukast Other (See Comments)   Nightmares    Other Itching, Other (See Comments)   Plastic and artificial Christmas trees = Itching   Prozac [fluoxetine] Other (See Comments)   Bad, vivid dreams   Trazodone And Nefazodone Other (See Comments)   Bad dreams 01/27/20 Pt is unsure if allergic to Nefazodone   Advair Diskus [fluticasone-salmeterol] Palpitations   Copper-containing Compounds Rash, Other (See Comments)   Makes the face burn, also        Medication List     STOP taking these medications    albuterol 108 (90 Base) MCG/ACT inhaler Commonly known as: VENTOLIN HFA   doxycycline 100 MG tablet Commonly known as: VIBRA-TABS       TAKE these medications    acetaminophen 500 MG tablet Commonly known as: TYLENOL Take 1,000 mg by mouth See admin instructions. Take 1,000 mg by mouth in the morning and at bedtime and an additional 1,000 mg once a day as needed for pain   azithromycin 500 MG tablet Commonly known as: Zithromax Take 1 tablet (500 mg total) by mouth daily for 2 days. Take 1 tablet daily for 3 days.   BARIATRIC MULTIVITAMINS/IRON PO Take 1 tablet by mouth daily with breakfast.   benzonatate 200 MG capsule Commonly known as: TESSALON Take 1 capsule (200 mg total) by mouth 3 (three) times daily as needed for cough. What changed:  when to take this reasons to take this   Breo Ellipta 200-25 MCG/ACT Aepb Generic drug: fluticasone furoate-vilanterol Inhale 1 puff into the lungs daily.   bumetanide 1 MG tablet Commonly known as: BUMEX Take 1 tablet (1 mg total) by mouth daily. What changed: when to take this   busPIRone 10 MG tablet Commonly known as: BUSPAR Take 1 tablet (10 mg total) by mouth 2 (two) times daily.   dofetilide 500 MCG capsule Commonly known as:  TIKOSYN Take 1 capsule (500 mcg total) by mouth 2 (two) times daily.   Dupixent 300 MG/2ML Sopn Generic drug: Dupilumab Inject 300 mg into the skin every 14 (fourteen) days.   Eliquis 5 MG Tabs tablet Generic drug: apixaban Take 1 tablet (5 mg total) by mouth 2 (two) times daily.   glipiZIDE 10 MG tablet Commonly known as: GLUCOTROL Take 10 mg by mouth 2 (two) times daily before a meal.   ipratropium 0.02 % nebulizer solution Commonly known as: ATROVENT Take 2.5 mLs (0.5 mg total) by nebulization every 8 (eight) hours as needed for wheezing or shortness of breath.   ipratropium 0.06 % nasal spray Commonly known as: ATROVENT Place 1 spray into both nostrils 4 (four) times daily as needed for rhinitis.   levalbuterol 1.25 MG/0.5ML nebulizer solution Commonly known as: XOPENEX Take 1.25 mg by nebulization every 8 (eight) hours as needed for wheezing or shortness of  breath.   Melatonin 10 MG Tabs Take 10 mg by mouth at bedtime. What changed: Another medication with the same name was removed. Continue taking this medication, and follow the directions you see here.   nystatin powder Commonly known as: MYCOSTATIN/NYSTOP Apply topically 2 (two) times daily. What changed:  how much to take when to take this reasons to take this   Opcon-A 0.027-0.315 % Soln Generic drug: Naphazoline-Pheniramine Place 1 drop into both eyes 2 (two) times daily as needed (for allergies or irritation).   oxyCODONE 5 MG immediate release tablet Commonly known as: Oxy IR/ROXICODONE Take 1 tablet (5 mg total) by mouth every 4 (four) hours as needed for breakthrough pain.   Ozempic (0.25 or 0.5 MG/DOSE) 2 MG/3ML Sopn Generic drug: Semaglutide(0.25 or 0.5MG /DOS) Inject 0.5 mg into the skin every Wednesday.   pantoprazole 40 MG tablet Commonly known as: PROTONIX Take 1 tablet (40 mg total) by mouth 2 (two) times daily.   polyethylene glycol powder 17 GM/SCOOP powder Commonly known as:  GLYCOLAX/MIRALAX Take 17 g by mouth daily as needed for mild constipation.   potassium chloride SA 20 MEQ tablet Commonly known as: KLOR-CON M Take 1 tablet (20 mEq total) by mouth daily for 15 days.   predniSONE 10 MG tablet Commonly known as: DELTASONE Take 1 tablet (10 mg total) by mouth daily for 2 days.   pregabalin 100 MG capsule Commonly known as: LYRICA Take 1 capsule (100 mg total) by mouth 2 (two) times daily.   propranolol 60 MG tablet Commonly known as: INDERAL Take 1 tablet (60 mg total) by mouth 3 (three) times daily.   Silvadene 1 % cream Generic drug: silver sulfADIAZINE Apply 1 Application topically daily. Apply to affected area What changed: when to take this   spironolactone 25 MG tablet Commonly known as: ALDACTONE Take 1 tablet (25 mg total) by mouth daily. What changed: when to take this   Transderm-Scop 1 MG/3DAYS Generic drug: scopolamine Place 1 patch (1.5 mg total) onto the skin every 3 (three) days. What changed:  when to take this reasons to take this   Vitamin D (Ergocalciferol) 1.25 MG (50000 UNIT) Caps capsule Commonly known as: DRISDOL Take 1 capsule (50,000 Units total) by mouth once a week. Monday What changed:  when to take this additional instructions   Voltaren Arthritis Pain 1 % Gel Generic drug: diclofenac Sodium Apply 2 g topically See admin instructions. Apply 2 grams to affected areas in the morning and at bedtime   Xopenex HFA 45 MCG/ACT inhaler Generic drug: levalbuterol Inhale 2 puffs into the lungs See admin instructions. Inhale 2 puffs into the lungs every 4-6 hours as needed for wheezing or shortness of breath   ZyrTEC Allergy 10 MG tablet Generic drug: cetirizine Take 10 mg by mouth daily as needed (for seasonal allergies).        Follow-up Information     Randel Pigg, Dorma Russell, MD Follow up in 1 week(s).   Specialty: Family Medicine Contact information: [phone] [place]  [address] [phone]                Allergies  Allergen Reactions   Gabapentin Anaphylaxis, Nausea And Vomiting and Other (See Comments)    "Cannot walk"   Topiramate Other (See Comments)    Jaw tightness and chest discomfort    Albuterol Other (See Comments)    CAN tolerate levalbuterol   Hydroxyzine Other (See Comments)    QT prolongation related  Imitrex [Sumatriptan] Nausea And Vomiting and Other (See Comments)    Increased heart rate   Latex Itching   Montelukast Other (See Comments)    Nightmares    Other Itching and Other (See Comments)    Plastic and artificial Christmas trees = Itching   Prozac [Fluoxetine] Other (See Comments)    Bad, vivid dreams   Trazodone And Nefazodone Other (See Comments)    Bad dreams 01/27/20 Pt is unsure if allergic to Nefazodone   Advair Diskus [Fluticasone-Salmeterol] Palpitations   Copper-Containing Compounds Rash and Other (See Comments)    Makes the face burn, also    You were cared for by a hospitalist during your hospital stay. If you have any questions about your discharge medications or the care you received while you were in the hospital after you are discharged, you can call the unit and asked to speak with the hospitalist on call if the hospitalist that took care of you is not available. Once you are discharged, your primary care physician will handle any further medical issues. Please note that no refills for any discharge medications will be authorized once you are discharged, as it is imperative that you return to your primary care physician (or establish a relationship with a primary care physician if you do not have one) for your aftercare needs so that they can reassess your need for medications and monitor your lab values.   Procedures/Studies: DG Chest Port 1 View  Result Date: 03/25/2022 CLINICAL DATA:  Shortness of breath.  History of bronchitis.  Cough. EXAM: PORTABLE CHEST 1 VIEW COMPARISON:  01/31/2022 FINDINGS:  Stable cardiac enlargement. Lung volumes are low. No pleural effusion or edema. No airspace opacities identified. IMPRESSION: Low lung volumes.  No acute findings. Electronically Signed   By: Kerby Moors M.D.   On: 03/25/2022 05:53   DG Knee 1-2 Views Left  Result Date: 03/08/2022 CLINICAL DATA:  Pain and swelling of left knee. EXAM: LEFT KNEE - 1-2 VIEW COMPARISON:  None Available. FINDINGS: There is diffuse decreased bone mineralization. Severe lateral and moderate medial compartment joint space narrowing and peripheral osteophytosis. Moderate-to-large superior and inferior patellar degenerative osteophytes. Large posterior femoral condyle peripheral degenerative osteophytes. Moderate superior trochlear degenerative osteophytes. No definite joint effusion. No acute fracture is seen. No dislocation. 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 Electronically Signed   By: Yvonne Kendall M.D.   On: 03/08/2022 12:40     The results of significant diagnostics from this hospitalization (including imaging, microbiology, ancillary and laboratory) are listed below for reference.     Microbiology: Recent Results (from the past 240 hour(s))  Resp panel by RT-PCR (RSV, Flu A&B, Covid) Anterior Nasal Swab     Status: Abnormal   Collection Time: 03/25/22  4:21 AM   Specimen: Anterior Nasal Swab  Result Value Ref Range Status   SARS Coronavirus 2 by RT PCR NEGATIVE NEGATIVE Final    Comment: (NOTE) SARS-CoV-2 target nucleic acids are NOT DETECTED.  The SARS-CoV-2 RNA is generally detectable in upper respiratory specimens during the acute phase of infection. The lowest concentration of SARS-CoV-2 viral copies this assay can detect is 138 copies/mL. A negative result does not preclude SARS-Cov-2 infection and should not be used as the sole basis for treatment or other patient management decisions. A negative result may occur with  improper specimen  collection/handling, submission of specimen other than nasopharyngeal swab, presence of viral mutation(s) within the areas targeted by this assay, and inadequate  number of viral copies(<138 copies/mL). A negative result must be combined with clinical observations, patient history, and epidemiological information. The expected result is Negative.  Fact Sheet for Patients:  BloggerCourse.com  Fact Sheet for Healthcare Providers:  SeriousBroker.it  This test is no t yet approved or cleared by the [place] FDA and  has been authorized for detection and/or diagnosis of SARS-CoV-2 by FDA under an Emergency Use Authorization (EUA). This EUA will remain  in effect (meaning this test can be used) for the duration of the COVID-19 declaration under Section 564(b)(1) of the Act, 21 U.S.C.section 360bbb-3(b)(1), unless the authorization is terminated  or revoked sooner.       Influenza A by PCR NEGATIVE NEGATIVE Final   Influenza B by PCR NEGATIVE NEGATIVE Final    Comment: (NOTE) The Xpert Xpress SARS-CoV-2/FLU/RSV plus assay is intended as an aid in the diagnosis of influenza from Nasopharyngeal swab specimens and should not be used as a sole basis for treatment. Nasal washings and aspirates are unacceptable for Xpert Xpress SARS-CoV-2/FLU/RSV testing.  Fact Sheet for Patients: BloggerCourse.com  Fact Sheet for Healthcare Providers: SeriousBroker.it  This test is not yet approved or cleared by the [place] FDA and has been authorized for detection and/or diagnosis of SARS-CoV-2 by FDA under an Emergency Use Authorization (EUA). This EUA will remain in effect (meaning this test can be used) for the duration of the COVID-19 declaration under Section 564(b)(1) of the Act, 21 U.S.C. section 360bbb-3(b)(1), unless the authorization is terminated or revoked.     Resp Syncytial  Virus by PCR POSITIVE (A) NEGATIVE Final    Comment: (NOTE) Fact Sheet for Patients: BloggerCourse.com  Fact Sheet for Healthcare Providers: SeriousBroker.it  This test is not yet approved or cleared by the [place] FDA and has been authorized for detection and/or diagnosis of SARS-CoV-2 by FDA under an Emergency Use Authorization (EUA). This EUA will remain in effect (meaning this test can be used) for the duration of the COVID-19 declaration under Section 564(b)(1) of the Act, 21 U.S.C. section 360bbb-3(b)(1), unless the authorization is terminated or revoked.  Performed at [facility], [address] [place], [place] [address]   Expectorated Sputum Assessment w Gram Stain, Rflx to Resp Cult     Status: None   Collection Time: 03/25/22  8:28 AM   Specimen: Expectorated Sputum  Result Value Ref Range Status   Specimen Description EXPECTORATED SPUTUM  Final   Special Requests NONE  Final   Sputum evaluation   Final    THIS SPECIMEN IS ACCEPTABLE FOR SPUTUM CULTURE Performed at [facility], [address] [place], [place] [address]    Report Status 03/25/2022 FINAL  Final  Culture, Respiratory w Gram Stain     Status: None (Preliminary result)   Collection Time: 03/25/22  8:28 AM  Result Value Ref Range Status   Specimen Description   Final    EXPECTORATED SPUTUM Performed at [facility], [address], [place], [place] [address]    Special Requests   Final    NONE Reflexed from [id number] Performed at [facility], [address] [place], [place] [address]    Gram Stain   Final    RARE WBC PRESENT, PREDOMINANTLY PMN RARE GRAM POSITIVE COCCI    Culture   Final    CULTURE REINCUBATED FOR BETTER GROWTH Performed at [facility], [address], [place], [place] [address]    Report Status PENDING  Incomplete  MRSA Next Gen by PCR,  Nasal     Status: None   Collection Time: 03/27/22 10:01 AM   Specimen: Nasal Mucosa; Nasal Swab  Result Value Ref Range Status   MRSA by PCR Next Gen NOT DETECTED NOT DETECTED Final    Comment: (NOTE) The GeneXpert MRSA Assay (FDA approved for NASAL specimens only), is one component of a comprehensive MRSA colonization surveillance program. It is not intended to diagnose MRSA infection nor to guide or monitor treatment for MRSA infections. Test performance is not FDA approved in patients less than 95 years old. Performed at [facility], [address] [place], [place] [address]      Labs: BNP (last 3 results) Recent Labs    01/31/22 1250 02/16/22 0539 03/25/22 0436  BNP 142.2* 120.5* 104.6*   Basic Metabolic Panel: Recent Labs  Lab 03/25/22 0436 03/25/22 0606 03/25/22 0850 03/26/22 0251 03/27/22 0515 03/28/22 0502  NA 135  --   --  135 136 138  K 3.7  --   --  4.3 4.4 3.5  CL 103  --   --  100 101 97*  CO2 24  --   --  25 27 28   GLUCOSE 148*  --   --  201* 174* 87  BUN 8  --   --  10 18 23*  CREATININE 0.91  --  0.89 0.82 0.85 0.90  CALCIUM 8.6*  --   --  8.9 9.2 9.1  MG  --  2.2  --   --  2.3 2.5*  PHOS  --  4.5  --   --   --   --    Liver Function Tests: Recent Labs  Lab 03/26/22 0251  AST 23  ALT 17  ALKPHOS 55  BILITOT 0.5  PROT 6.9  ALBUMIN 3.6   No results for input(s): "LIPASE", "AMYLASE" in the last 168 hours. No results for input(s): "AMMONIA" in the last 168 hours. CBC: Recent Labs  Lab 03/25/22 0436 03/25/22 0850 03/26/22 0251 03/27/22 0515 03/28/22 0502  WBC 7.5 7.7 7.3 8.3 8.7  NEUTROABS 5.3  --   --   --   --   HGB 10.8* 11.4* 10.4* 11.1* 11.7*  HCT 37.1 38.7 34.5* 38.1 40.0  MCV 92.1 90.8 90.1 91.4 90.7  PLT 177 179 163 164 185   Cardiac Enzymes: No results for input(s): "CKTOTAL", "CKMB", "CKMBINDEX", "TROPONINI" in the last 168 hours. BNP: Invalid input(s): "POCBNP" CBG: Recent Labs  Lab  03/27/22 0751 03/27/22 1132 03/27/22 1651 03/27/22 2126 03/28/22 0755  GLUCAP 139* 133* 193* 140* 91   D-Dimer No results for input(s): "DDIMER" in the last 72 hours. Hgb A1c No results for input(s): "HGBA1C" in the last 72 hours. Lipid Profile No results for input(s): "CHOL", "HDL", "LDLCALC", "TRIG", "CHOLHDL", "LDLDIRECT" in the last 72 hours. Thyroid function studies No results for input(s): "TSH", "T4TOTAL", "T3FREE", "THYROIDAB" in the last 72 hours.  Invalid input(s): "FREET3" Anemia work up No results for input(s): "VITAMINB12", "FOLATE", "FERRITIN", "TIBC", "IRON", "RETICCTPCT" in the last 72 hours. Urinalysis    Component Value Date/Time   COLORURINE YELLOW 02/16/2022 0757   APPEARANCEUR CLEAR 02/16/2022 0757   LABSPEC <1.005 (L) 02/16/2022 0757   PHURINE 6.0 02/16/2022 0757   GLUCOSEU NEGATIVE 02/16/2022 0757   HGBUR NEGATIVE 02/16/2022 0757   BILIRUBINUR NEGATIVE 02/16/2022 0757   BILIRUBINUR negative 07/28/2016 1614   BILIRUBINUR neg 08/29/2014 1528   KETONESUR NEGATIVE 02/16/2022 0757   PROTEINUR  NEGATIVE 02/16/2022 0757   UROBILINOGEN 0.2 07/28/2016 1614   NITRITE NEGATIVE 02/16/2022 0757   LEUKOCYTESUR NEGATIVE 02/16/2022 0757   Sepsis Labs Recent Labs  Lab 03/25/22 0850 03/26/22 0251 03/27/22 0515 03/28/22 0502  WBC 7.7 7.3 8.3 8.7   Microbiology Recent Results (from the past 240 hour(s))  Resp panel by RT-PCR (RSV, Flu A&B, Covid) Anterior Nasal Swab     Status: Abnormal   Collection Time: 03/25/22  4:21 AM   Specimen: Anterior Nasal Swab  Result Value Ref Range Status   SARS Coronavirus 2 by RT PCR NEGATIVE NEGATIVE Final    Comment: (NOTE) SARS-CoV-2 target nucleic acids are NOT DETECTED.  The SARS-CoV-2 RNA is generally detectable in upper respiratory specimens during the acute phase of infection. The lowest concentration of SARS-CoV-2 viral copies this assay can detect is 138 copies/mL. A negative result does not preclude  SARS-Cov-2 infection and should not be used as the sole basis for treatment or other patient management decisions. A negative result may occur with  improper specimen collection/handling, submission of specimen other than nasopharyngeal swab, presence of viral mutation(s) within the areas targeted by this assay, and inadequate number of viral copies(<138 copies/mL). A negative result must be combined with clinical observations, patient history, and epidemiological information. The expected result is Negative.  Fact Sheet for Patients:  BloggerCourse.comhttps://www.fda.gov/media/152166/download  Fact Sheet for Healthcare Providers:  SeriousBroker.ithttps://www.fda.gov/media/152162/download  This test is no t yet approved or cleared by the [place] FDA and  has been authorized for detection and/or diagnosis of SARS-CoV-2 by FDA under an Emergency Use Authorization (EUA). This EUA will remain  in effect (meaning this test can be used) for the duration of the COVID-19 declaration under Section 564(b)(1) of the Act, 21 U.S.C.section 360bbb-3(b)(1), unless the authorization is terminated  or revoked sooner.       Influenza A by PCR NEGATIVE NEGATIVE Final   Influenza B by PCR NEGATIVE NEGATIVE Final    Comment: (NOTE) The Xpert Xpress SARS-CoV-2/FLU/RSV plus assay is intended as an aid in the diagnosis of influenza from Nasopharyngeal swab specimens and should not be used as a sole basis for treatment. Nasal washings and aspirates are unacceptable for Xpert Xpress SARS-CoV-2/FLU/RSV testing.  Fact Sheet for Patients: BloggerCourse.comhttps://www.fda.gov/media/152166/download  Fact Sheet for Healthcare Providers: SeriousBroker.ithttps://www.fda.gov/media/152162/download  This test is not yet approved or cleared by the [place] FDA and has been authorized for detection and/or diagnosis of SARS-CoV-2 by FDA under an Emergency Use Authorization (EUA). This EUA will remain in effect (meaning this test can be used) for the duration of  the COVID-19 declaration under Section 564(b)(1) of the Act, 21 U.S.C. section 360bbb-3(b)(1), unless the authorization is terminated or revoked.     Resp Syncytial Virus by PCR POSITIVE (A) NEGATIVE Final    Comment: (NOTE) Fact Sheet for Patients: BloggerCourse.comhttps://www.fda.gov/media/152166/download  Fact Sheet for Healthcare Providers: SeriousBroker.ithttps://www.fda.gov/media/152162/download  This test is not yet approved or cleared by the [place] FDA and has been authorized for detection and/or diagnosis of SARS-CoV-2 by FDA under an Emergency Use Authorization (EUA). This EUA will remain in effect (meaning this test can be used) for the duration of the COVID-19 declaration under Section 564(b)(1) of the Act, 21 U.S.C. section 360bbb-3(b)(1), unless the authorization is terminated or revoked.  Performed at [facility], [address] [place], [place] [id number]   Expectorated Sputum Assessment w Gram Stain, Rflx to Resp Cult     Status: None   Collection Time: 03/25/22  8:28 AM  Specimen: Expectorated Sputum  Result Value Ref Range Status   Specimen Description EXPECTORATED SPUTUM  Final   Special Requests NONE  Final   Sputum evaluation   Final    THIS SPECIMEN IS ACCEPTABLE FOR SPUTUM CULTURE Performed at [facility], [place] [address] [place], [place] [address]    Report Status 03/25/2022 FINAL  Final  Culture, Respiratory w Gram Stain     Status: None (Preliminary result)   Collection Time: 03/25/22  8:28 AM  Result Value Ref Range Status   Specimen Description   Final    EXPECTORATED SPUTUM Performed at [place] [address] [place], [place] [address]    Special Requests   Final    NONE Reflexed from [id number] Performed at [facility] [place] [address], [place], [place] [address]    Gram Stain   Final    RARE WBC PRESENT, PREDOMINANTLY PMN RARE GRAM POSITIVE COCCI    Culture   Final    CULTURE  REINCUBATED FOR BETTER GROWTH Performed at [facility], [place] [address] [place], [place] [address]    Report Status PENDING  Incomplete  MRSA Next Gen by PCR, Nasal     Status: None   Collection Time: 03/27/22 10:01 AM   Specimen: Nasal Mucosa; Nasal Swab  Result Value Ref Range Status   MRSA by PCR Next Gen NOT DETECTED NOT DETECTED Final    Comment: (NOTE) The GeneXpert MRSA Assay (FDA approved for NASAL specimens only), is one component of a comprehensive MRSA colonization surveillance program. It is not intended to diagnose MRSA infection nor to guide or monitor treatment for MRSA infections. Test performance is not FDA approved in patients less than 74 years old. Performed at [facility], [place] [address], [place], [place] [address]      Time coordinating discharge:  I have spent 35 minutes face to face with the patient and on the ward discussing the patients care, assessment, plan and disposition with other care givers. >50% of the time was devoted counseling the patient about the risks and benefits of treatment/Discharge disposition and coordinating care.   SIGNED:   Damita Lack, MD  [facility] 03/28/2022, 10:03 AM   If 7PM-7AM, please contact night-coverage

## 2022-03-31 ENCOUNTER — Encounter: Payer: Medicaid Other | Admitting: Physical Medicine & Rehabilitation

## 2022-04-01 ENCOUNTER — Ambulatory Visit: Payer: Medicaid Other

## 2022-04-01 ENCOUNTER — Ambulatory Visit: Payer: Medicaid Other | Admitting: Occupational Therapy

## 2022-04-03 ENCOUNTER — Ambulatory Visit: Payer: Medicaid Other | Admitting: Occupational Therapy

## 2022-04-03 ENCOUNTER — Ambulatory Visit: Payer: Medicaid Other | Admitting: Physical Therapy

## 2022-04-07 ENCOUNTER — Ambulatory Visit: Payer: Medicaid Other | Admitting: Occupational Therapy

## 2022-04-07 ENCOUNTER — Ambulatory Visit: Payer: Medicaid Other

## 2022-04-07 DIAGNOSIS — R262 Difficulty in walking, not elsewhere classified: Secondary | ICD-10-CM | POA: Diagnosis present

## 2022-04-07 DIAGNOSIS — M6281 Muscle weakness (generalized): Secondary | ICD-10-CM | POA: Diagnosis present

## 2022-04-07 DIAGNOSIS — R2689 Other abnormalities of gait and mobility: Secondary | ICD-10-CM

## 2022-04-07 DIAGNOSIS — R2681 Unsteadiness on feet: Secondary | ICD-10-CM

## 2022-04-07 DIAGNOSIS — R5381 Other malaise: Secondary | ICD-10-CM | POA: Diagnosis present

## 2022-04-07 NOTE — Therapy (Signed)
OUTPATIENT OCCUPATIONAL THERAPY  Treatment Note  Patient Name: Alyssa Blanchard MRN: [id number] DOB:1971/02/17, 52 y.o., female Today's Date: 03/19/2022  PCP: Randel Pigg, Dorma Russell, MD REFERRING PROVIDER: Fanny Dance, MD  END OF SESSION:  OT End of Session - 04/07/22 1007     Visit Number 3    Number of Visits 13    Date for OT Re-Evaluation 05/16/22    Authorization Type [place] Medicaid UHC    OT Start Time 0932    OT Stop Time 1012    OT Time Calculation (min) 40 min    Activity Tolerance Patient tolerated treatment well    Behavior During Therapy WFL for tasks assessed/performed               Past Medical History:  Diagnosis Date   Anxiety    Asthma    Atrial fibrillation (HCC)    Depression    DM (diabetes mellitus), type 2 (HCC) 02/16/2022   Dysrhythmia    new onset Afib rvr   GERD (gastroesophageal reflux disease)    Heart failure (HCC)    Heel spur    bilat   History of bronchitis    History of chicken pox    History of urinary tract infection    Migraines    Plantar fasciitis    bilat   STD (sexually transmitted disease)    chl hx & hsv 1&2   Tremors of nervous system    Urinary incontinence    Past Surgical History:  Procedure Laterality Date   CARDIOVERSION N/A 08/05/2019   Procedure: CARDIOVERSION;  Surgeon: [name] Deloris Ping, MD;  Location: [facility] ENDOSCOPY;  Service: Cardiovascular;  Laterality: N/A;   LAPAROSCOPIC GASTRIC SLEEVE RESECTION N/A 11/27/2014   Procedure: LAPAROSCOPIC GASTRIC SLEEVE RESECTION WITH HIATAL HERNIA REPAIR UPPER ENDOSCOPY;  Surgeon: Luretha Murphy, MD;  Location: [facility] ORS;  Service: General;  Laterality: N/A;   TEE WITHOUT CARDIOVERSION N/A 08/05/2019   Procedure: TRANSESOPHAGEAL ECHOCARDIOGRAM (TEE);  Surgeon: [name] Deloris Ping, MD;  Location: [facility] ENDOSCOPY;  Service: Cardiovascular;  Laterality: N/A;   TONSILLECTOMY  1978   Patient Active Problem List   Diagnosis Date Noted   Debility 02/28/2022   Prolonged QT interval  02/17/2022   Cellulitis of right leg 02/16/2022   Lumbar radiculopathy, right 02/16/2022   Anemia 02/16/2022   Iron deficiency anemia 02/02/2022   Type 2 diabetes mellitus without complication, without long-term current use of insulin (HCC) 12/25/2021   Bipolar 1 disorder (HCC) 01/30/2020   Chronic combined systolic and diastolic congestive heart failure (HCC) 09/25/2019   Acute pulmonary edema (HCC)    Paroxysmal atrial fibrillation (HCC) 08/02/2019   Bipolar disorder, in full remission, most recent episode manic (HCC) 04/15/2019   Psychosis (HCC) 04/14/2019   Not well controlled moderate persistent asthma 02/02/2018   Chronic rhinitis 02/02/2018   Status asthmaticus 11/29/2017   Acute respiratory failure with hypoxia (HCC) 11/29/2017   Gastroesophageal reflux disease 09/01/2017   Cough 12/31/2016   Dysuria 07/28/2016   Clinical infection 07/28/2016   History of asthma 04/17/2016   Acute bronchitis 04/17/2016   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   Migraine without aura and without status migrainosus, not intractable 10/10/2014   Migraine with aura and without status migrainosus, not intractable 08/29/2014   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 classified elsewhere 08/29/2014   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   Snorings 07/10/2014   Sleep related headaches 07/10/2014   Nocturia more than twice per night 07/10/2014   Preventative health care  07/30/2012   Class 3 severe obesity due to excess calories with serious comorbidity and body mass index (BMI) greater than or equal to 70 in adult [facility]) 07/30/2012    ONSET DATE: October 2023  REFERRING DIAG: R53.81 (ICD-10-CM) - Other malaise I50.42 (ICD-10-CM) - Chronic combined systolic (congestive) and diastolic (congestive) heart failure  THERAPY DIAG:  Muscle weakness (generalized)  Other abnormalities of gait and mobility  Unsteadiness on feet  Rationale for Evaluation and Treatment:  Rehabilitation  SUBJECTIVE:   SUBJECTIVE STATEMENT: Pt reports having pneumonia and RSV which had her in the hospital for a week.  She reports she has been home now for a week.   Pt accompanied by: self  PERTINENT HISTORY:  Presented to [facility] long hospital 02/16/2022 with increasing swelling of lower extremities and recent fall with complaints of tenderness of the right hip and erythema posterior right leg and knee.  She remains on chronic Lyrica for lumbar radiculopathy as well as oxycodone as needed.  She reports continued pain and numbness in R thigh.  X-ray revealed moderate DJD of lumbar. Patient was admitted with cellulitis of R leg, anemia, and right lumbar radiculopathy. CHE:NIDPOEU disorder, HFrEF, a fib, obesity.   PRECAUTIONS: Fall  WEIGHT BEARING RESTRICTIONS: No  PAIN:  Are you having pain? Yes: NPRS scale: 5-6/10 Pain location: R knee Pain description: burning, stabbing Aggravating factors: inconsistent Relieving factors: Lyrica, oxycodone will "keep it at [place]"  FALLS: Has patient fallen in last 6 months? Yes. Number of falls 1  LIVING ENVIRONMENT: Lives with:  lives with 78 yo mother Lives in: House/apartment Stairs: Yes: Internal: 1 step down to sunken living room, which is where pt sleeps;  and External: 4 steps; bilateral but cannot reach both Has following equipment at home: Single point cane, Walker - 2 wheeled, shower chair, and bed side commode  PLOF:  requiring assistance for ~1 year due to fluid overload and difficulty with breathing, Mother was assisting with everything  PATIENT GOALS: to walk unassisted  OBJECTIVE:   HAND DOMINANCE: Right  ADLs: Transfers/ambulation related to ADLs: Mod I with RW/SPC Grooming: decreased endurance with standing grooming tasks UB Dressing: Mod I, mom will assist with setup of items LB Dressing: Mod I with use of AE,  Toileting: Increased time/effort with clothing management, does utilize toilet aid for  hygiene Bathing: sponge bathing at sink currently Tub Shower transfers: have not attempted  Equipment: Shower seat with back, bed side commode, Reacher, Sock aid, Long handled shoe horn, and Long handled sponge  IADLs: Shopping: grocery delivery Light housekeeping: Mother is completing Meal Prep: Mother is completing Community mobility: currently using transportation Medication management: Mod I with pill box Financial management: Mother is completing  MOBILITY STATUS: Needs Assist: RW in community or long distances, SPC in home  COGNITION: Overall cognitive status: Within functional limits for tasks assessed and feels like Lyrica and oxycodone impact her memory and processing, "slows her down"   TODAY'S TREATMENT:                                                                   04/07/22 ADL: Pt reports that she is still unable to don tennis shoes, but can get on slip on/crocs.  Pt reports she is able to don  socks occasionally without sock aid.  OT educated on sitting in semi-circle sitting on EOB with bringing leg up on couch/bed to increase reach. Discussed use of step stool as aid to increase reach when SOB. Dynamic standing: Utilizing 2,4,6# resistance clothespins with RUE and LUE for high functional reaching and sustained pinch. Pt required prolonged rest break after standing ~90 seconds.  Incorporating trunk rotation when picking up and placing clothespins to simulate movements during ADLs/IADLs.   Pt able to tolerate standing up to 2 mins during subsequent sets.  OT providing cues for breathing technique to increase tolerance.  O2 sats 92-97% after standing, cues for pursed lip breathing.   Therapeutic exercise: Engaged in overhead press with 2# medicine ball x10 with focus on breathing and standing tolerance.  Modified PNF pattern reaching with 2# medicine ball bilaterally.  Pt reporting mild discomfort in back with increased standing, able to take seated rest break and resume tasks.   OT providing cues for breathing during exercises.  Completed 2nd set of above exercises in sitting due to discomfort in back.     03/21/22 Energy conservation: Encouraged use of RW for mobility to decrease energy expenditure, especially when more fatigued as pt reports nearly out of breath when making it from bathroom to bedroom.  Reviewed planning ahead, pacing, prioritizing, and positioning to conserve energy.  OT providing demonstration and education about body positioning and technique. Breathing strategies: educated on pursed lip and diaphragmatic breathing.  Pt practiced "milkshake" breathing for focus on long deep inhale.  Educated on breathing with mobility and not holding breath. Standing: Pt tolerated standing 2:30 mins during table top activity while alternating UE support on RW and weight shifting at hips, but no forward lean this session.   PATIENT EDUCATION: Education details: educated on energy conservation strategies, breathing technique, and use of RW for energy conservation Person educated: Patient Education method: Explanation Education comprehension: verbalized understanding  HOME EXERCISE PROGRAM: TBD   GOALS: Goals reviewed with patient? Yes  SHORT TERM GOALS: Target date: 04/18/22  Pt will be independent in full body strengthening HEP for strength/endurance. Baseline: not currently engaging in any exercises Goal status: IN PROGRESS  2.  Pt will tolerate standing 2 mins during grooming task without rest break or change in posture. Baseline: requiring seated rest break or leaning on counter with tasks <2 mins Goal status: IN PROGRESS  3.  Pt will verbalize understanding of task modifications and/or potential AE needs to increase ease, safety, and independence w/ ADLs (particularly with donning socks/shoes). Baseline: increased time/effort for donning shoes Goal status: IN PROGRESS  4.  Pt will verbalize understanding of energy conservation techniques  Baseline:  fatigues quickly Goal status: IN PROGRESS   LONG TERM GOALS: Target date: 05/16/22  Pt will tolerate standing 5-10 mins to engage in simple meal prep task with </= to 1 rest break. Baseline: requiring seated rest break or leaning on counter with tasks <2 mins Goal status: IN PROGRESS  2.  Pt will demonstrate and/or report safety awareness and technique with tub/shower transfer to allow for bathing at shower level. Baseline: unable to transfer in/out of shower, completing sponge bath at this time Goal status: IN PROGRESS  3.  Pt will verbalize safe technique for car transfer to facilitate return to driving when medically stable. Baseline: Pt reports difficulty fitting due to body habitus and R leg pain. Goal status: IN PROGRESS  4.  Pt will demonstrate improved dynamic standing balance to allow for increased ease and independence  with clothing management post toileting at Mod I level. Baseline: increased difficulty and occasional assist with clothing management. Goal status: IN PROGRESS   ASSESSMENT:  CLINICAL IMPRESSION: Pt had a hospital stay since last present for OT, due to RSV and pneumonia.  Pt reports she is moving better and has been able to get in/out of car to resume driving self to appointments.  She reports she has completed prescription meds from RSV/pneumonia and feels weak from hospital stay.  Pt demonstrating decreased standing tolerance due to debility, however able to resume after seated rest breaks throughout session.  Pt requiring cues for breathing technique during exercises and seated rest breaks.  Pt will continue to benefit from focus on breathing strategies, especially during activity.  PERFORMANCE DEFICITS: in functional skills including ADLs, IADLs, strength, pain, balance, body mechanics, endurance, cardiopulmonary status limiting function, and decreased knowledge of use of DME and psychosocial skills including environmental adaptation, habits, and routines and  behaviors.   IMPAIRMENTS: are limiting patient from ADLs, IADLs, and social participation.   CO-MORBIDITIES: may have co-morbidities  that affects occupational performance. Patient will benefit from skilled OT to address above impairments and improve overall function.  MODIFICATION OR ASSISTANCE TO COMPLETE EVALUATION: No modification of tasks or assist necessary to complete an evaluation.  OT OCCUPATIONAL PROFILE AND HISTORY: Detailed assessment: Review of records and additional review of physical, cognitive, psychosocial history related to current functional performance.  CLINICAL DECISION MAKING: LOW - limited treatment options, no task modification necessary  REHAB POTENTIAL: Good  EVALUATION COMPLEXITY: Low    PLAN:  OT FREQUENCY: 1-2x/week (plan to start 2x/week and decrease to 1x/week after ~4 weeks)  OT DURATION: 8 weeks  PLANNED INTERVENTIONS: self care/ADL training, therapeutic exercise, therapeutic activity, balance training, functional mobility training, aquatic therapy, compression bandaging, moist heat, cryotherapy, patient/family education, psychosocial skills training, energy conservation, coping strategies training, and DME and/or AE instructions  RECOMMENDED OTHER SERVICES: N/A  CONSULTED AND AGREED WITH PLAN OF CARE: Patient  PLAN FOR NEXT SESSION: problem solve transfers, standing balance/endurance, review energy conservation strategies, initiate strengthening HEP   Jurney Overacker, [place], OTR/L 04/07/2022, 10:07 AM

## 2022-04-08 ENCOUNTER — Ambulatory Visit: Payer: Medicaid Other

## 2022-04-08 MED ORDER — SODIUM CHLORIDE 0.9 % IV SOLN
1000.0000 mg | Freq: Once | INTRAVENOUS | Status: AC
  Filled 2022-04-08: qty 10

## 2022-04-08 MED ORDER — ACETAMINOPHEN 325 MG PO TABS: 650.0000 mg | ORAL_TABLET | Freq: Once | ORAL | Status: AC

## 2022-04-09 ENCOUNTER — Ambulatory Visit: Payer: Medicaid Other

## 2022-04-09 ENCOUNTER — Ambulatory Visit: Payer: Medicaid Other | Admitting: Occupational Therapy

## 2022-04-11 ENCOUNTER — Encounter: Payer: Medicaid Other | Attending: Physical Medicine & Rehabilitation | Admitting: Physical Medicine & Rehabilitation

## 2022-04-14 ENCOUNTER — Ambulatory Visit: Payer: Medicaid Other | Attending: Physical Medicine & Rehabilitation | Admitting: Occupational Therapy

## 2022-04-14 ENCOUNTER — Ambulatory Visit: Payer: Medicaid Other

## 2022-04-14 DIAGNOSIS — R262 Difficulty in walking, not elsewhere classified: Secondary | ICD-10-CM | POA: Insufficient documentation

## 2022-04-14 DIAGNOSIS — R5381 Other malaise: Secondary | ICD-10-CM | POA: Insufficient documentation

## 2022-04-14 DIAGNOSIS — R2681 Unsteadiness on feet: Secondary | ICD-10-CM | POA: Insufficient documentation

## 2022-04-14 DIAGNOSIS — M6281 Muscle weakness (generalized): Secondary | ICD-10-CM | POA: Insufficient documentation

## 2022-04-14 DIAGNOSIS — R2689 Other abnormalities of gait and mobility: Secondary | ICD-10-CM | POA: Insufficient documentation

## 2022-04-16 ENCOUNTER — Ambulatory Visit: Payer: Medicaid Other | Admitting: Occupational Therapy

## 2022-04-16 ENCOUNTER — Ambulatory Visit: Payer: Medicaid Other

## 2022-04-17 ENCOUNTER — Telehealth: Payer: Self-pay | Admitting: Occupational Therapy

## 2022-04-17 NOTE — Telephone Encounter (Signed)
Left HIPAA compliant vm and asked for return call as pt with recent 2 no shows for OT/PT.  Alyssa Blanchard, OT

## 2022-04-21 ENCOUNTER — Ambulatory Visit: Payer: Medicaid Other | Admitting: Occupational Therapy

## 2022-04-21 ENCOUNTER — Ambulatory Visit: Payer: Medicaid Other

## 2022-04-21 ENCOUNTER — Encounter: Payer: Self-pay | Admitting: Occupational Therapy

## 2022-04-21 NOTE — Therapy (Signed)
[facility] [address] [place], [place], [address] Phone: [phone]   Fax:  [phone]  Patient Details  Name: Alyssa Blanchard MRN: [id number] Date of Birth: June 23, 1970 Referring Provider:  Patrecia Pour, Christean Grief, MD  Encounter Date: 04/21/2022  PHYSICAL THERAPY DISCHARGE SUMMARY  Visits from Start of Care: 2  Current functional level related to goals / functional outcomes: Unable to determine   Remaining deficits: unknown   Education / Equipment: HEP initiated   Patient agrees to discharge. Patient goals were not met. Patient is being discharged due to not returning since the last visit.   Toniann Fail, PT 04/21/2022, 9:02 AM  [facility] [address] [place], [place], [address] Phone: [phone]   Fax:  [phone]

## 2022-04-21 NOTE — Therapy (Signed)
[facility] [address] [place], [place], [address] Phone: [phone]   Fax:  [phone]  Patient Details  Name: Alyssa Blanchard MRN: [id number] Date of Birth: January 07, 1971 Referring Provider:  No ref. provider found  Encounter Date: 04/21/2022  OCCUPATIONAL THERAPY DISCHARGE SUMMARY  Visits from Start of Care: 3  Current functional level related to goals / functional outcomes: Unable to determine as pt has returned x1 since recent hospitalization with decreased tolerance compared to session prior to hospital admission.  Pt has not returned since last visit.  This therapist attempted to reach out to pt, however did not answer or return message.   Remaining deficits: Decreased activity tolerance   Education / Equipment: HEP initiated, energy conservation   Patient agrees to discharge. Patient goals were not met. Patient is being discharged due to not returning since the last visit.Marland Kitchen     Simonne Come, OT 04/21/2022, 2:42 PM  [facility] [address], [place], [place], [address] Phone: [phone]   Fax:  [phone]

## 2022-04-28 ENCOUNTER — Ambulatory Visit: Payer: Medicaid Other

## 2022-04-28 ENCOUNTER — Encounter: Payer: Medicaid Other | Admitting: Occupational Therapy

## 2022-04-30 ENCOUNTER — Ambulatory Visit: Payer: Medicaid Other | Admitting: Emergency Medicine

## 2022-05-01 ENCOUNTER — Encounter: Payer: Self-pay | Admitting: Physician Assistant

## 2022-05-01 ENCOUNTER — Telehealth: Payer: Self-pay | Admitting: Physician Assistant

## 2022-05-01 NOTE — Telephone Encounter (Signed)
Called patient per 2/22 secure chat to respond to patient mychart message about rescheduling 2/23 appointments. Left voicemail with contact information for patient to call me directly to reschedule. Cancelling appointments.

## 2022-05-02 ENCOUNTER — Inpatient Hospital Stay: Payer: Medicaid Other

## 2022-05-02 ENCOUNTER — Inpatient Hospital Stay: Payer: Medicaid Other | Admitting: Physician Assistant

## 2022-05-05 ENCOUNTER — Ambulatory Visit: Payer: Medicaid Other

## 2022-05-05 ENCOUNTER — Encounter: Payer: Medicaid Other | Admitting: Occupational Therapy

## 2022-05-06 ENCOUNTER — Ambulatory Visit: Payer: Medicaid Other | Admitting: Internal Medicine

## 2022-05-12 ENCOUNTER — Ambulatory Visit: Payer: Medicaid Other

## 2022-05-12 ENCOUNTER — Encounter: Payer: Medicaid Other | Admitting: Occupational Therapy

## 2022-05-23 ENCOUNTER — Encounter: Payer: Self-pay | Admitting: Physician Assistant

## 2022-05-27 ENCOUNTER — Ambulatory Visit: Payer: Medicaid Other | Admitting: Emergency Medicine

## 2022-06-23 ENCOUNTER — Encounter: Payer: Self-pay | Admitting: Nurse Practitioner
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
## 2022-06-30 ENCOUNTER — Ambulatory Visit (INDEPENDENT_AMBULATORY_CARE_PROVIDER_SITE_OTHER): Payer: Medicaid Other | Admitting: Psychiatry

## 2022-06-30 ENCOUNTER — Encounter (HOSPITAL_COMMUNITY): Payer: Self-pay | Admitting: Psychiatry

## 2022-06-30 DIAGNOSIS — F419 Anxiety disorder, unspecified: Secondary | ICD-10-CM

## 2022-06-30 DIAGNOSIS — F3174 Bipolar disorder, in full remission, most recent episode manic: Secondary | ICD-10-CM | POA: Diagnosis not present

## 2022-06-30 MED ORDER — LAMOTRIGINE 25 MG PO TABS: ORAL_TABLET | ORAL | 1 refills | Status: DC

## 2022-06-30 NOTE — Patient Instructions (Signed)
Thank you for attending your appointment today.  -- START lamotrigine 25 mg each evening for 2 weeks then increase to 50 mg each evening -- Continue other medications as prescribed.  Please do not make any changes to medications without first discussing with your provider. If you are experiencing a psychiatric emergency, please call 911 or present to your nearest emergency department. Additional crisis, medication management, and therapy resources are included below.  [facility]  [address] [place], [place] [address] [phone] WALK-IN URGENT CARE 24/7 FOR ANYONE [address] [place], [place]  [phone] Fax: [phone] [email or web address] *Interpreters available *Accepts all insurance and uninsured for Urgent Care needs *Accepts Medicaid and uninsured for outpatient treatment (below)      ONLY FOR [facility]  Below:    Outpatient New Patient Assessment/Therapy Walk-ins:        Monday -Thursday 8am until slots are full.        Every Friday 1pm-4pm  (first come, first served)                   New Patient Psychiatry/Medication Management        Monday-Friday 8am-11am (first come, first served)               For all walk-ins we ask that you arrive by 7:15am, because patients will be seen in the order of arrival.

## 2022-07-02 ENCOUNTER — Other Ambulatory Visit: Payer: Self-pay | Admitting: Family Medicine

## 2022-07-02 DIAGNOSIS — Z1231 Encounter for screening mammogram for malignant neoplasm of breast: Secondary | ICD-10-CM

## 2022-07-04 ENCOUNTER — Encounter: Payer: Medicaid Other | Admitting: Physical Medicine & Rehabilitation

## 2022-07-07 ENCOUNTER — Encounter: Payer: Self-pay | Admitting: Podiatry

## 2022-07-07 ENCOUNTER — Ambulatory Visit (INDEPENDENT_AMBULATORY_CARE_PROVIDER_SITE_OTHER): Payer: Medicaid Other | Admitting: Podiatry

## 2022-07-07 DIAGNOSIS — E119 Type 2 diabetes mellitus without complications: Secondary | ICD-10-CM

## 2022-07-07 NOTE — Progress Notes (Signed)
  Subjective:  Patient ID: Alyssa Blanchard, female    DOB: Jun 07, 1970,   MRN: [id number]  Chief Complaint  Patient presents with   Diabetes    Foot exam     52 y.o. female presents for diabetic foot exam. Denies burning and tingling in their feet. Patient is diabetic and last A1c was  Lab Results  Component Value Date   HGBA1C 5.5 03/25/2022   .   PCP:  Randel Pigg, Dorma Russell, MD    . Denies any other pedal complaints. Denies n/v/f/c.   Past Medical History:  Diagnosis Date   Anxiety    Asthma    Atrial fibrillation (HCC)    Depression    DM (diabetes mellitus), type 2 (HCC) 02/16/2022   Dysrhythmia    new onset Afib rvr   GERD (gastroesophageal reflux disease)    Heart failure (HCC)    Heel spur    bilat   History of bronchitis    History of chicken pox    History of urinary tract infection    Migraines    Plantar fasciitis    bilat   STD (sexually transmitted disease)    chl hx & hsv 1&2   Tremors of nervous system    Urinary incontinence     Objective:  Physical Exam: Vascular: DP/PT pulses 2/4 bilateral. CFT <3 seconds. Normal hair growth on digits. No edema.  Skin. No lacerations or abrasions bilateral feet. Nails 1-5 bilateral are normal in appearance. Some xerosis noted to bilateral plantar feet Musculoskeletal: MMT 5/5 bilateral lower extremities in DF, PF, Inversion and Eversion. Deceased ROM in DF of ankle joint.  Neurological: Sensation intact to light touch. Protective sensation intact.   Assessment:   1. Type 2 diabetes mellitus without complication, without long-term current use of insulin (HCC)      Plan:  Patient was evaluated and treated and all questions answered. -Discussed and educated patient on diabetic foot care, especially with  regards to the vascular, neurological and musculoskeletal systems.  -Stressed the importance of good glycemic control and the detriment of not  controlling glucose levels in relation to the foot. -Discussed  supportive shoes at all times and checking feet regularly.  -Answered all patient questions -Patient to return  in 1 year for diabetic foot check.  -Patient advised to call the office if any problems or questions arise in the meantime.   Louann Sjogren, DPM

## 2022-07-08 ENCOUNTER — Telehealth (HOSPITAL_COMMUNITY): Payer: Self-pay | Admitting: *Deleted

## 2022-07-08 NOTE — Telephone Encounter (Signed)
Patient called and states she is unable to take Lamictal. Was vomiting, had headache, and insomnia. Asking if there is something else she can suggest. Message sent to MD.

## 2022-07-10 ENCOUNTER — Telehealth (HOSPITAL_COMMUNITY): Payer: Self-pay | Admitting: Psychiatry

## 2022-07-10 MED ORDER — ARIPIPRAZOLE 5 MG PO TABS: 5.0000 mg | ORAL_TABLET | Freq: Every day | ORAL | 1 refills | Status: DC

## 2022-07-10 NOTE — Telephone Encounter (Signed)
Received message from clinical staff that patient called reporting vomiting, headache, and insomnia with lamotrigine and requesting alternative medication. Called patient back for approx. 5 minute phone call. She reports she took lamotrigine 25 mg for 4 days but has since stopped due to aforementioned symptoms. Remains interested in alternative medication for mood stability and irritability. Reviewed past medication trials; she does not recall response to Abilify although remembers taking this and believes she received injection once. Agreeable to retrial of Abilify given more favorable metabolic profile.  Plan: -- Patient has already stopped lamotrigine -- START Abilify 5 mg daily -- Risks, benefits, and side effects including but not limited to dizziness, constipation, akathisia, metabolic effects were reviewed with informed consent provided  Daine Gip, MD 07/10/22

## 2022-07-11 ENCOUNTER — Encounter: Payer: Medicaid Other | Attending: Physical Medicine & Rehabilitation | Admitting: Physical Medicine & Rehabilitation

## 2022-07-23 ENCOUNTER — Telehealth: Payer: Self-pay

## 2022-07-23 ENCOUNTER — Ambulatory Visit (INDEPENDENT_AMBULATORY_CARE_PROVIDER_SITE_OTHER): Payer: Medicaid Other | Admitting: Nurse Practitioner

## 2022-07-23 ENCOUNTER — Encounter: Payer: Self-pay | Admitting: Nurse Practitioner

## 2022-07-23 ENCOUNTER — Ambulatory Visit: Payer: Medicaid Other | Admitting: Internal Medicine

## 2022-07-23 VITALS — BP 134/78 | HR 72 | Ht 65.0 in | Wt >= 6400 oz

## 2022-07-23 DIAGNOSIS — K625 Hemorrhage of anus and rectum: Secondary | ICD-10-CM

## 2022-07-23 DIAGNOSIS — D509 Iron deficiency anemia, unspecified: Secondary | ICD-10-CM

## 2022-07-23 DIAGNOSIS — I5042 Chronic combined systolic (congestive) and diastolic (congestive) heart failure: Secondary | ICD-10-CM

## 2022-07-23 DIAGNOSIS — K219 Gastro-esophageal reflux disease without esophagitis: Secondary | ICD-10-CM | POA: Diagnosis not present

## 2022-07-23 MED ORDER — NA SULFATE-K SULFATE-MG SULF 17.5-3.13-1.6 GM/177ML PO SOLN: 1.0000 | Freq: Once | ORAL | 0 refills | Status: AC

## 2022-07-23 NOTE — Telephone Encounter (Signed)
Error

## 2022-07-23 NOTE — Telephone Encounter (Signed)
Pt scheduled at [facility] for [facility] on 10/14/22 at 7:30 am. Contacted pt & LVM to return call.

## 2022-07-23 NOTE — Patient Instructions (Addendum)
We will contact your to schedule your upper endoscopy & colonoscopy once we hear back from your cardiologist.  Contact our office if you have any changes in your health status prior to your EGD/Colonoscopy date.  Gaviscon (over the counter)- 1 tablespoon 3 times daily as needed  Benefiber- 1 tablespoon daily   Due to recent changes in healthcare laws, you may see the results of your imaging and laboratory studies on MyChart before your provider has had a chance to review them.  We understand that in some cases there may be results that are confusing or concerning to you. Not all laboratory results come back in the same time frame and the provider may be waiting for multiple results in order to interpret others.  Please give [place] 48 hours in order for your provider to thoroughly review all the results before contacting the office for clarification of your results.   Thank you for trusting me with your gastrointestinal care!   Alcide Evener, CRNP

## 2022-07-23 NOTE — Telephone Encounter (Signed)
Pt called & pt is aware to hold Eliquis 2 days prior to her procedure per cardiology & is also aware of procedure date, time & location. Instructions were mailed & sent via MyChart.

## 2022-07-23 NOTE — Progress Notes (Addendum)
07/23/2022 Alyssa Blanchard [id number] [birth year]   CHIEF COMPLAINT: iron deficiency anemia   HISTORY OF PRESENT ILLNESS: Alyssa Blanchard is a 52 year old female with a past medical history of anxiety, depression, bipolar disorder, morbid obesity, asthma, atrial fibrillation s/p cardioversion on Eliquis, diastolic CHF, pulmonary hypertension, DM type II, migraine headaches, eczema, cellulitis, hepatic steatosis, iron deficiency anemia and GERD. S/P gastric sleeve 2016.  She presents to our office today as referred by Georga Blanchard hematology PA-C for further evaluation regarding iron deficiency anemia.  She endorses having a history of GERD which was present prior to her gastric sleeve surgery but significantly worsened postoperatively.  She describes having horrible acid reflux despite taking pantoprazole 40 mg twice daily.  She has intermittent upper abdominal pain.  She has dysphagia, describes having food which gets stuck to the throat/upper esophagus, she gags and the food comes out which occurs once or twice weekly.  She sometimes has difficulty swallowing pills.  She has nausea and vomits 4 days weekly she stated also started immediately after her gastric sleeve surgery.  Emesis described as partially digested food or nonbloody bilious fluid.  No coffee-ground or hematemesis.  She takes Phenergan PRN with symptom relief.  He has variable bowel movements.  She has alternating constipation and diarrhea.  She can go 2 to 3 days without passing a bowel movement then passes small brown hard stools for few days then has diarrhea 4-5 times daily for the next week then the cycle repeats.  She passes dark red blood with her bowel movements once weekly for several years which has decreased over the past 3 to 4 months and last occurred 2 months ago.  She is on Eliquis for atrial fibrillation.  She is received numerous courses of antibiotics over the past 6 months without worsening diarrhea.  She was  previously on Ozempic which was stopped 03/2022 as this medication was no longer authorized by her insurance.  She denies ever having an EGD or colonoscopy.  Father with history of pancreatic cancer.  No known family history of esophageal, gastric or colon cancer.  She has a history of diastolic/systolic CHF and atrial fibrillation which required several hospital admissions over the past few years which included cardioversion and Tikosyn loading. She remains on Eliquis. Propranolol was discontinued when she saw her cardiologist Dt. Tan on 07/04/2022 and she was started on Losartan 50 mg daily.  She is no longer on Tikosyn but stated this medication may be restarted in the near future. Dr. Jodie Echevaria thought her recurrent PAF and heart failure would likely improve with further weight loss.  She denies having any chest pain, palpitations or shortness of breath.  ECHO 01/01/2022:  SUMMARY  Image Quality Technically difficult.  Left ventricular systolic function is normal.  Mild left ventricular hypertrophy  LV ejection fraction = 65-70%.  There is mild aortic stenosis.  There is mild tricuspid regurgitation.  Mild to moderate pulmonary hypertension.  There is no pericardial effusion. VENOUS - IVC size was moderately dilated.   Compared to the last study there is now mild aortic gradient and pulmonary  hypertension.   TEE 08/05/2019: IMPRESSIONS  1. Left ventricular ejection fraction, by estimation, is 30 to 35%. The left ventricle has moderately decreased function. The left ventricle demonstrates global hypokinesis.  2. Right ventricular systolic function is normal. The right ventricular size is normal.  3. No left atrial/left atrial appendage thrombus was detected.  4. The mitral valve is grossly normal.  Mild mitral valve regurgitation. No evidence of mitral stenosis.  5. The aortic valve is grossly normal. Aortic valve regurgitation is trivial. No aortic stenosis is present.       Latest Ref Rng &  Units 03/28/2022    5:02 AM 03/27/2022    5:15 AM 03/26/2022    2:51 AM  CBC  WBC 4.0 - 10.5 K/uL 8.7  8.3  7.3   Hemoglobin 12.0 - 15.0 g/dL 16.1  09.6  04.5   Hematocrit 36.0 - 46.0 % 40.0  38.1  34.5   Platelets 150 - 400 K/uL 185  164  163    MCV 90.7. RDW 17.8.     Latest Ref Rng & Units 03/28/2022    5:02 AM 03/27/2022    5:15 AM 03/26/2022    2:51 AM  CMP  Glucose 70 - 99 mg/dL 87  409  811   BUN 6 - 20 mg/dL 23  18  10    Creatinine 0.44 - 1.00 mg/dL 9.14  7.82  9.56   Sodium 135 - 145 mmol/L 138  136  135   Potassium 3.5 - 5.1 mmol/L 3.5  4.4  4.3   Chloride 98 - 111 mmol/L 97  101  100   CO2 22 - 32 mmol/L 28  27  25    Calcium 8.9 - 10.3 mg/dL 9.1  9.2  8.9   Total Protein 6.5 - 8.1 g/dL   6.9   Total Bilirubin 0.3 - 1.2 mg/dL   0.5   Alkaline Phos 38 - 126 U/L   55   AST 15 - 41 U/L   23   ALT 0 - 44 U/L   17      Past Medical History:  Diagnosis Date   Anxiety    Asthma    Atrial fibrillation (HCC)    Depression    DM (diabetes mellitus), type 2 (HCC) 02/16/2022   Dysrhythmia    new onset Afib rvr   GERD (gastroesophageal reflux disease)    Heart failure (HCC)    Heel spur    bilat   History of bronchitis    History of chicken pox    History of urinary tract infection    Migraines    Plantar fasciitis    bilat   STD (sexually transmitted disease)    chl hx & hsv 1&2   Tremors of nervous system    Urinary incontinence    Past Surgical History:  Procedure Laterality Date   CARDIOVERSION N/A 08/05/2019   Procedure: CARDIOVERSION;  Surgeon: Nahser, Deloris Ping, MD;  Location: [facility] ENDOSCOPY;  Service: Cardiovascular;  Laterality: N/A;   LAPAROSCOPIC GASTRIC SLEEVE RESECTION N/A 11/27/2014   Procedure: LAPAROSCOPIC GASTRIC SLEEVE RESECTION WITH HIATAL HERNIA REPAIR UPPER ENDOSCOPY;  Surgeon: Luretha Murphy, MD;  Location: [facility] ORS;  Service: General;  Laterality: N/A;   TEE WITHOUT CARDIOVERSION N/A 08/05/2019   Procedure: TRANSESOPHAGEAL ECHOCARDIOGRAM (TEE);   Surgeon: [name] Deloris Ping, MD;  Location: [facility] ENDOSCOPY;  Service: Cardiovascular;  Laterality: N/A;   TONSILLECTOMY  1978   Social History: She is single.  She smokes cigarettes on and off, quit smoking 10 to 15 years ago.  No alcohol use.  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  Past marijuana use, abstinent x 20 years.  Family History: family history includes Alzheimer's disease in her paternal grandmother; Cancer in her father, maternal uncle, and paternal uncle; Diabetes in her maternal grandmother and mother; Heart attack in her  maternal grandfather; Heart failure in her maternal grandmother; Hyperlipidemia in her mother; Hypertension in her maternal grandfather, maternal grandmother, mother, paternal grandfather, and paternal grandmother; Kidney disease in her mother. Father had pancreatic cancer, diagnosed age 71.  Allergies  Allergen Reactions   Gabapentin Anaphylaxis, Nausea And Vomiting and Other (See Comments)    "Cannot walk"   Topiramate Other (See Comments)    Jaw tightness and chest discomfort    Albuterol Other (See Comments)    CAN tolerate levalbuterol   Hydroxyzine Other (See Comments)    QT prolongation related   Imitrex [Sumatriptan] Nausea And Vomiting and Other (See Comments)    Increased heart rate   Latex Itching   Montelukast Other (See Comments)    Nightmares    Other Itching and Other (See Comments)    Plastic and artificial Christmas trees = Itching   Prozac [Fluoxetine] Other (See Comments)    Bad, vivid dreams   Trazodone And Nefazodone Other (See Comments)    Bad dreams 01/27/20 Pt is unsure if allergic to Nefazodone   Advair Diskus [Fluticasone-Salmeterol] Palpitations   Copper-Containing Compounds Rash and Other (See Comments)    Makes the face burn, also      Outpatient Encounter Medications as of 07/23/2022  Medication Sig   acetaminophen (TYLENOL) 500 MG tablet Take 1,000 mg by mouth See admin instructions. Take 1,000 mg by  mouth in the morning and at bedtime and an additional 1,000 mg once a day as needed for pain   apixaban (ELIQUIS) 5 MG TABS tablet Take 1 tablet (5 mg total) by mouth 2 (two) times daily.   ARIPiprazole (ABILIFY) 5 MG tablet Take 1 tablet (5 mg total) by mouth daily.   benzonatate (TESSALON) 200 MG capsule Take 1 capsule (200 mg total) by mouth 3 (three) times daily as needed for cough.   bumetanide (BUMEX) 1 MG tablet Take 1 tablet (1 mg total) by mouth daily. (Patient taking differently: Take 1 mg by mouth daily as needed (swelling).)   busPIRone (BUSPAR) 10 MG tablet Take 1 tablet (10 mg total) by mouth 2 (two) times daily.   dofetilide (TIKOSYN) 500 MCG capsule Take 1 capsule (500 mcg total) by mouth 2 (two) times daily.   Dupilumab (DUPIXENT) 300 MG/2ML SOPN Inject 300 mg into the skin every 14 (fourteen) days.   fluticasone furoate-vilanterol (BREO ELLIPTA) 200-25 MCG/ACT AEPB Inhale 1 puff into the lungs daily.   glipiZIDE (GLUCOTROL) 10 MG tablet Take 10 mg by mouth 2 (two) times daily before a meal.   ipratropium (ATROVENT) 0.02 % nebulizer solution Take 2.5 mLs (0.5 mg total) by nebulization every 8 (eight) hours as needed for wheezing or shortness of breath.   ipratropium (ATROVENT) 0.06 % nasal spray Place 1 spray into both nostrils 4 (four) times daily as needed for rhinitis.   levalbuterol (XOPENEX) 1.25 MG/0.5ML nebulizer solution Take 1.25 mg by nebulization every 8 (eight) hours as needed for wheezing or shortness of breath.   Melatonin 10 MG TABS Take 10 mg by mouth at bedtime.   Multiple Vitamins-Minerals (BARIATRIC MULTIVITAMINS/IRON PO) Take 1 tablet by mouth daily with breakfast.   nystatin (MYCOSTATIN/NYSTOP) powder Apply topically 2 (two) times daily. (Patient taking differently: Apply 1 Application topically daily as needed (skin irritation).)   OPCON-A 0.027-0.315 % SOLN Place 1 drop into both eyes 2 (two) times daily as needed (for allergies or irritation).   oxyCODONE  (OXY IR/ROXICODONE) 5 MG immediate release tablet Take 1 tablet (5 mg total) by  mouth every 4 (four) hours as needed for breakthrough pain.   pantoprazole (PROTONIX) 40 MG tablet Take 1 tablet (40 mg total) by mouth 2 (two) times daily.   polyethylene glycol powder (GLYCOLAX/MIRALAX) 17 GM/SCOOP powder Take 17 g by mouth daily as needed for mild constipation.   potassium chloride SA (KLOR-CON M) 20 MEQ tablet Take 1 tablet (20 mEq total) by mouth daily for 15 days.   pregabalin (LYRICA) 100 MG capsule Take 1 capsule (100 mg total) by mouth 2 (two) times daily.   propranolol (INDERAL) 60 MG tablet Take 1 tablet (60 mg total) by mouth 3 (three) times daily.   scopolamine (TRANSDERM-SCOP) 1 MG/3DAYS Place 1 patch (1.5 mg total) onto the skin every 3 (three) days. (Patient taking differently: Place 1 patch onto the skin every three (3) days as needed (for nausea).)   Semaglutide,0.25 or 0.5MG /DOS, (OZEMPIC, 0.25 OR 0.5 MG/DOSE,) 2 MG/3ML SOPN Inject 0.5 mg into the skin every Wednesday. (Patient not taking: Reported on 06/30/2022)   silver sulfADIAZINE (SILVADENE) 1 % cream Apply 1 Application topically daily. Apply to affected area (Patient taking differently: Apply 1 Application topically See admin instructions. Apply to affected area)   spironolactone (ALDACTONE) 25 MG tablet Take 1 tablet (25 mg total) by mouth daily. (Patient taking differently: Take 25 mg by mouth in the morning.)   Vitamin D, Ergocalciferol, (DRISDOL) 1.25 MG (50000 UNIT) CAPS capsule Take 1 capsule (50,000 Units total) by mouth once a week. Monday (Patient taking differently: Take 50,000 Units by mouth every Monday.)   VOLTAREN ARTHRITIS PAIN 1 % GEL Apply 2 g topically See admin instructions. Apply 2 grams to affected areas in the morning and at bedtime   XOPENEX HFA 45 MCG/ACT inhaler Inhale 2 puffs into the lungs See admin instructions. Inhale 2 puffs into the lungs every 4-6 hours as needed for wheezing or shortness of breath    ZYRTEC ALLERGY 10 MG tablet Take 10 mg by mouth daily as needed (for seasonal allergies).   Facility-Administered Encounter Medications as of 07/23/2022  Medication   acetaminophen (TYLENOL) tablet 650 mg   ferric derisomaltose (MONOFERRIC) 1,000 mg in sodium chloride 0.9 % 100 mL infusion   REVIEW OF SYSTEMS:  Gen: Denies fever, sweats or chills. No weight loss.  CV: Denies chest pain, palpitations or edema. Resp: Denies cough, shortness of breath of hemoptysis.  GI: Denies heartburn, dysphagia, stomach or lower abdominal pain. No diarrhea or constipation.  GU : Denies urinary burning, blood in urine, increased urinary frequency or incontinence. MS: Denies joint pain, muscles aches or weakness. Derm: Denies rash, itchiness, skin lesions or unhealing ulcers. Psych: Denies depression, anxiety, memory loss or confusion. Heme: Denies bruising, easy bleeding. Neuro:  Denies headaches, dizziness or paresthesias. Endo:  Denies any problems with DM, thyroid or adrenal function.  PHYSICAL EXAM: There were no vitals taken for this visit. BP 134/78   Pulse 72   Ht 5\' 5"  (1.651 m)   Wt (!) 410 lb 9.6 oz (186.2 kg)   SpO2 98%   BMI 68.33 kg/m  Patient reported prior weight 1 year ago was 500 pounds.  General: 52 year old female in no acute distress. Head: Normocephalic and atraumatic. Eyes:  Sclerae non-icteric, conjunctive pink. Ears: Normal auditory acuity. Mouth: Scattered absent dentition.  No ulcers or lesions.  Neck: Supple, no lymphadenopathy or thyromegaly.  Lungs: Clear bilaterally to auscultation without wheezes, crackles or rhonchi. Heart: Regular rate and rhythm. No murmur, rub or gallop appreciated.  Abdomen: Soft, obese  abdomen. Nontender, nondistended. No masses. No hepatosplenomegaly. Normoactive bowel sounds x 4 quadrants. Note, patient was unable to sit on the exam table as she was concerned she might fall therefore her abdominal exam was completed sitting upright in the  chair. Rectal: Deferred. Musculoskeletal: Symmetrical with no gross deformities. Skin: Warm and dry. No rash or lesions on visible extremities. Extremities: Mild bilateral lower extremity edema Neurological: Alert oriented x 4, no focal deficits.  Psychological:  Alert and cooperative. Normal mood and affect.  ASSESSMENT AND PLAN:  52 year old female with iron deficiency anemia  -EGD and colonoscopy at [facility], to be scheduled after cardiac clearance received. Benefits and risks discussed including risk with sedation, risk of bleeding, perforation and infection  -Cardiac clearance to also include Eliquis hold instructions, request sent to Dr. Pete Pelt at [facility] -Continue follow-up with hematology for CBC/iron level monitoring and IV iron as needed  Chronic GERD and dysphagia with solid foods and sometimes with pills -Continue Pantoprazole 40 mg p.o. twice daily -Gaviscon 1 tablespoon p.o. 3 times daily as needed not to take within 1 hour of any cardiac medications  Nausea and vomiting  -See plan above   Rectal bleeding.  Alternating diarrhea/constipation.  No rectal bleeding x 2 months. -Colonoscopy as ordered above -Patient to contact our office if rectal bleeding recurs -Patient to contact office if she has daily diarrhea, will check C. difficile at that point -Benefiber 1 tablespoon daily as tolerated  Afib on Eliquis  -Cardiac clearance to also include Eliquis hold instructions required prior to the patient scheduling EGD and colonoscopy  CHF. LVEF 65-70% per ECHO 12/2021.  DM type II  ADDENDUM: Cardiac clearance with Eliquis hold clearance received by Dr. Pete Pelt as follows:      CC:  Randel Pigg, Dorma Russell, MD

## 2022-07-23 NOTE — Addendum Note (Signed)
Addended by: Rise Paganini on: 07/23/2022 02:01 PM   Modules accepted: Orders

## 2022-07-24 NOTE — Progress Notes (Signed)
Agree with the assessment and plan as outlined by Alcide Evener, NP.  Will book for EGD/colonoscopy in hospital when slot available.   Kelilah Hebard E. Tomasa Rand, MD [facility]

## 2022-07-30 ENCOUNTER — Ambulatory Visit (HOSPITAL_COMMUNITY): Payer: Medicaid Other | Admitting: Mental Health

## 2022-07-30 DIAGNOSIS — Z1231 Encounter for screening mammogram for malignant neoplasm of breast: Secondary | ICD-10-CM

## 2022-08-04 NOTE — Progress Notes (Unsigned)
Patient did not connect for virtual psychiatric medication management appointment on 08/06/22 at 3PM. Sent secure video link with no response. Left VM with callback number to reschedule.  Daine Gip, MD 08/06/22  This encounter was created in error - please disregard.

## 2022-08-06 ENCOUNTER — Encounter (HOSPITAL_COMMUNITY): Payer: Medicaid Other | Admitting: Psychiatry

## 2022-08-06 ENCOUNTER — Encounter (HOSPITAL_COMMUNITY): Payer: Self-pay

## 2022-08-15 ENCOUNTER — Other Ambulatory Visit (HOSPITAL_COMMUNITY): Payer: Self-pay | Admitting: Psychiatry

## 2022-08-28 ENCOUNTER — Ambulatory Visit: Payer: Medicaid Other

## 2022-09-24 ENCOUNTER — Ambulatory Visit: Payer: Medicaid Other

## 2022-10-01 ENCOUNTER — Ambulatory Visit: Payer: Medicaid Other

## 2022-10-07 ENCOUNTER — Encounter (HOSPITAL_COMMUNITY): Payer: Self-pay | Admitting: Gastroenterology

## 2022-10-07 NOTE — Progress Notes (Signed)
Attempted to obtain medical history via telephone, unable to reach at this time. Unable to leave voicemail to return pre surgical testing department's phone call,due to mailbox full.  

## 2022-10-13 NOTE — Anesthesia Preprocedure Evaluation (Signed)
Anesthesia Evaluation  Patient identified by MRN, date of birth, ID band Patient awake    Reviewed: Allergy & Precautions, NPO status , Patient's Chart, lab work & pertinent test results  Airway Mallampati: I  TM Distance: >3 FB Neck ROM: Full    Dental  (+) Poor Dentition, Chipped, Missing, Dental Advisory Given,    Pulmonary asthma , former smoker   Pulmonary exam normal breath sounds clear to auscultation       Cardiovascular hypertension, Pt. on home beta blockers and Pt. on medications +CHF  Normal cardiovascular exam+ dysrhythmias Atrial Fibrillation  Rhythm:Regular Rate:Normal     Neuro/Psych  Headaches PSYCHIATRIC DISORDERS Anxiety Depression Bipolar Disorder      GI/Hepatic Neg liver ROS,GERD  ,,  Endo/Other  diabetes, Type 2  Morbid obesity (BMI 67)  Renal/GU negative Renal ROS  negative genitourinary   Musculoskeletal negative musculoskeletal ROS (+)    Abdominal   Peds  Hematology negative hematology ROS (+)   Anesthesia Other Findings   Reproductive/Obstetrics                             Anesthesia Physical Anesthesia Plan  ASA: 3  Anesthesia Plan: MAC   Post-op Pain Management:    Induction: Intravenous  PONV Risk Score and Plan: Propofol infusion and Treatment may vary due to age or medical condition  Airway Management Planned: Natural Airway  Additional Equipment:   Intra-op Plan:   Post-operative Plan:   Informed Consent: I have reviewed the patients History and Physical, chart, labs and discussed the procedure including the risks, benefits and alternatives for the proposed anesthesia with the patient or authorized representative who has indicated his/her understanding and acceptance.     Dental advisory given  Plan Discussed with: CRNA  Anesthesia Plan Comments:        Anesthesia Quick Evaluation

## 2022-10-14 ENCOUNTER — Encounter (HOSPITAL_COMMUNITY)
Admission: RE | Disposition: A | Discharge status: Home / Self Care | Payer: Self-pay | Source: Ambulatory Visit | Attending: Gastroenterology

## 2022-10-14 ENCOUNTER — Other Ambulatory Visit: Payer: Self-pay

## 2022-10-14 ENCOUNTER — Ambulatory Visit (HOSPITAL_BASED_OUTPATIENT_CLINIC_OR_DEPARTMENT_OTHER): Payer: Medicaid Other | Admitting: Certified Registered Nurse Anesthetist

## 2022-10-14 ENCOUNTER — Ambulatory Visit (HOSPITAL_COMMUNITY): Payer: Medicaid Other | Admitting: Certified Registered Nurse Anesthetist

## 2022-10-14 ENCOUNTER — Encounter (HOSPITAL_COMMUNITY): Payer: Self-pay | Admitting: Gastroenterology

## 2022-10-14 ENCOUNTER — Ambulatory Visit (HOSPITAL_COMMUNITY)
Admission: RE | Admit: 2022-10-14 | Discharge: 2022-10-14 | Disposition: A | Discharge status: Home / Self Care | Payer: Medicaid Other | Source: Ambulatory Visit | Attending: Gastroenterology | Admitting: Gastroenterology

## 2022-10-14 DIAGNOSIS — I110 Hypertensive heart disease with heart failure: Secondary | ICD-10-CM

## 2022-10-14 DIAGNOSIS — D128 Benign neoplasm of rectum: Secondary | ICD-10-CM

## 2022-10-14 DIAGNOSIS — D509 Iron deficiency anemia, unspecified: Secondary | ICD-10-CM | POA: Insufficient documentation

## 2022-10-14 DIAGNOSIS — Z7901 Long term (current) use of anticoagulants: Secondary | ICD-10-CM | POA: Diagnosis not present

## 2022-10-14 DIAGNOSIS — Z9884 Bariatric surgery status: Secondary | ICD-10-CM | POA: Diagnosis not present

## 2022-10-14 DIAGNOSIS — I5042 Chronic combined systolic (congestive) and diastolic (congestive) heart failure: Secondary | ICD-10-CM

## 2022-10-14 DIAGNOSIS — K3189 Other diseases of stomach and duodenum: Secondary | ICD-10-CM

## 2022-10-14 DIAGNOSIS — K621 Rectal polyp: Secondary | ICD-10-CM | POA: Insufficient documentation

## 2022-10-14 DIAGNOSIS — K625 Hemorrhage of anus and rectum: Secondary | ICD-10-CM

## 2022-10-14 DIAGNOSIS — K2950 Unspecified chronic gastritis without bleeding: Secondary | ICD-10-CM | POA: Diagnosis not present

## 2022-10-14 DIAGNOSIS — F319 Bipolar disorder, unspecified: Secondary | ICD-10-CM | POA: Insufficient documentation

## 2022-10-14 DIAGNOSIS — I4891 Unspecified atrial fibrillation: Secondary | ICD-10-CM | POA: Diagnosis not present

## 2022-10-14 DIAGNOSIS — K921 Melena: Secondary | ICD-10-CM | POA: Diagnosis not present

## 2022-10-14 DIAGNOSIS — I509 Heart failure, unspecified: Secondary | ICD-10-CM | POA: Diagnosis not present

## 2022-10-14 DIAGNOSIS — R112 Nausea with vomiting, unspecified: Secondary | ICD-10-CM | POA: Insufficient documentation

## 2022-10-14 DIAGNOSIS — K219 Gastro-esophageal reflux disease without esophagitis: Secondary | ICD-10-CM | POA: Insufficient documentation

## 2022-10-14 DIAGNOSIS — E119 Type 2 diabetes mellitus without complications: Secondary | ICD-10-CM | POA: Diagnosis not present

## 2022-10-14 DIAGNOSIS — K2980 Duodenitis without bleeding: Secondary | ICD-10-CM | POA: Diagnosis not present

## 2022-10-14 DIAGNOSIS — Z6841 Body Mass Index (BMI) 40.0 and over, adult: Secondary | ICD-10-CM | POA: Insufficient documentation

## 2022-10-14 DIAGNOSIS — Z87891 Personal history of nicotine dependence: Secondary | ICD-10-CM

## 2022-10-14 DIAGNOSIS — D126 Benign neoplasm of colon, unspecified: Secondary | ICD-10-CM

## 2022-10-14 DIAGNOSIS — D122 Benign neoplasm of ascending colon: Secondary | ICD-10-CM | POA: Insufficient documentation

## 2022-10-14 HISTORY — PX: COLONOSCOPY WITH PROPOFOL: SHX5780

## 2022-10-14 HISTORY — PX: POLYPECTOMY: SHX5525

## 2022-10-14 HISTORY — PX: BIOPSY: SHX5522

## 2022-10-14 HISTORY — PX: ESOPHAGOGASTRODUODENOSCOPY (EGD) WITH PROPOFOL: SHX5813

## 2022-10-14 LAB — GLUCOSE, CAPILLARY
Glucose-Capillary: 121 mg/dL — ABNORMAL HIGH (ref 70–99)
Glucose-Capillary: 113 mg/dL — ABNORMAL HIGH (ref 70–99)

## 2022-10-14 SURGERY — ESOPHAGOGASTRODUODENOSCOPY (EGD) WITH PROPOFOL
Anesthesia: Monitor Anesthesia Care

## 2022-10-14 MED ORDER — MIDAZOLAM HCL 2 MG/2ML IJ SOLN
INTRAMUSCULAR | Status: AC
  Filled 2022-10-14: qty 2

## 2022-10-14 MED ORDER — PROPOFOL 1000 MG/100ML IV EMUL
INTRAVENOUS | Status: AC
  Filled 2022-10-14: qty 100

## 2022-10-14 MED ORDER — MIDAZOLAM HCL 2 MG/2ML IJ SOLN
INTRAMUSCULAR | Status: DC | PRN
  Administered 2022-10-14: 2 mg via INTRAVENOUS

## 2022-10-14 MED ORDER — APIXABAN 5 MG PO TABS: 5.0000 mg | ORAL_TABLET | Freq: Two times a day (BID) | ORAL | 0 refills | Status: DC

## 2022-10-14 MED ORDER — PROPOFOL 500 MG/50ML IV EMUL
INTRAVENOUS | Status: DC | PRN
  Administered 2022-10-14: 125 ug/kg/min via INTRAVENOUS

## 2022-10-14 MED ORDER — LACTATED RINGERS IV SOLN: INTRAVENOUS | Status: DC

## 2022-10-14 MED ORDER — SODIUM CHLORIDE 0.9 % IV SOLN: INTRAVENOUS | Status: DC

## 2022-10-14 MED ORDER — PROPOFOL 10 MG/ML IV BOLUS
INTRAVENOUS | Status: DC | PRN
Start: 2022-10-14 — End: 2022-10-14
  Administered 2022-10-14 (×2): 20 mg via INTRAVENOUS

## 2022-10-14 SURGICAL SUPPLY — 25 items

## 2022-10-14 NOTE — H&P (Signed)
[facility] History and Physical   Primary Care Physician:  Randel Pigg, Dorma Russell, MD   Reason for Procedure:   Iron deficiency anemia, nausea/vomiting, GERD, hematochezia  Plan:    EGD, colonoscopy     HPI: Alyssa Blanchard is a 52 y.o. female undergoing EGD and colonoscopy to evaluate iron deficiency anemia.  She is s/p sleeve gastrectomy.  She also has chronic symptoms of nausea/vomiting, GERD and irregular bowel habits with recurrent bright red blood per rectum.  She has no family history of colon cancer.  No prior endoscopic evaluation.  She takes Eliquis for a-fib, last dose Aug 3.   Past Medical History:  Diagnosis Date   Anxiety    Asthma    Atrial fibrillation (HCC)    Depression    DM (diabetes mellitus), type 2 (HCC) 02/16/2022   Dysrhythmia    new onset Afib rvr   GERD (gastroesophageal reflux disease)    Heart failure (HCC)    Heel spur    bilat   History of bronchitis    History of chicken pox    History of urinary tract infection    Hypertension    Migraines    Plantar fasciitis    bilat   STD (sexually transmitted disease)    chl hx & hsv 1&2   Tremors of nervous system    Urinary incontinence     Past Surgical History:  Procedure Laterality Date   CARDIOVERSION N/A 08/05/2019   Procedure: CARDIOVERSION;  Surgeon: Nahser, Deloris Ping, MD;  Location: [facility];  Service: Cardiovascular;  Laterality: N/A;   LAPAROSCOPIC GASTRIC SLEEVE RESECTION N/A 11/27/2014   Procedure: LAPAROSCOPIC GASTRIC SLEEVE RESECTION WITH HIATAL HERNIA REPAIR UPPER ENDOSCOPY;  Surgeon: Luretha Murphy, MD;  Location: [facility];  Service: General;  Laterality: N/A;   TEE WITHOUT CARDIOVERSION N/A 08/05/2019   Procedure: TRANSESOPHAGEAL ECHOCARDIOGRAM (TEE);  Surgeon: [name] Deloris Ping, MD;  Location: [facility] ENDOSCOPY;  Service: Cardiovascular;  Laterality: N/A;   TONSILLECTOMY  1978    Prior to Admission medications   Medication Sig Start Date End Date Taking? Authorizing  Provider  acetaminophen (TYLENOL) 500 MG tablet Take 1,000 mg by mouth See admin instructions. Take 1,000 mg by mouth in the morning and at bedtime and an additional 1,000 mg once a day as needed for pain   Yes [provider]  bumetanide (BUMEX) 1 MG tablet Take 1 tablet (1 mg total) by mouth daily. Patient taking differently: Take 1 mg by mouth daily as needed (swelling). 03/11/22  Yes Angiulli, Mcarthur Rossetti, PA-C  fluticasone furoate-vilanterol (BREO ELLIPTA) 200-25 MCG/ACT AEPB Inhale 1 puff into the lungs daily. 03/11/22  Yes Angiulli, Mcarthur Rossetti, PA-C  glipiZIDE (GLUCOTROL) 10 MG tablet Take 10 mg by mouth 2 (two) times daily before a meal. 12/02/21 10/14/22 Yes [provider]  Melatonin 10 MG TABS Take 10 mg by mouth at bedtime.   Yes [provider]  Multiple Vitamins-Minerals (BARIATRIC MULTIVITAMINS/IRON PO) Take 1 tablet by mouth daily with breakfast.   Yes [provider]  nystatin (MYCOSTATIN/NYSTOP) powder Apply topically 2 (two) times daily. Patient taking differently: Apply 1 Application topically daily as needed (skin irritation). 02/01/20  Yes Armandina Stammer I, NP  OPCON-A 0.027-0.315 % SOLN Place 1 drop into both eyes 2 (two) times daily as needed (for allergies or irritation).   Yes [provider]  pantoprazole (PROTONIX) 40 MG tablet Take 1 tablet (40 mg total) by mouth 2 (two) times daily. 03/11/22  Yes Mariam Dollar  J, PA-C  propranolol (INDERAL) 60 MG tablet Take 1 tablet (60 mg total) by mouth 3 (three) times daily. 03/11/22  Yes Angiulli, Mcarthur Rossetti, PA-C  VOLTAREN ARTHRITIS PAIN 1 % GEL Apply 2 g topically See admin instructions. Apply 2 grams to affected areas in the morning and at bedtime 01/20/22  Yes [provider]  ZYRTEC ALLERGY 10 MG tablet Take 10 mg by mouth daily as needed (for seasonal allergies).   Yes [provider]  apixaban (ELIQUIS) 5 MG TABS tablet Take 1 tablet (5 mg total) by mouth 2 (two) times daily.  03/11/22 07/23/22  Angiulli, Mcarthur Rossetti, PA-C  ARIPiprazole (ABILIFY) 5 MG tablet Take 1 tablet (5 mg total) by mouth daily. Patient not taking: Reported on 07/23/2022 07/10/22 09/08/22  Theodoro Kos A  Dupilumab (DUPIXENT) 300 MG/2ML SOPN Inject 300 mg into the skin every 14 (fourteen) days.    [provider]  levalbuterol (XOPENEX) 1.25 MG/0.5ML nebulizer solution Take 1.25 mg by nebulization every 8 (eight) hours as needed for wheezing or shortness of breath. 03/28/22   Amin, Loura Halt, MD  polyethylene glycol powder (GLYCOLAX/MIRALAX) 17 GM/SCOOP powder Take 17 g by mouth daily as needed for mild constipation. 01/10/22   [provider]  potassium chloride SA (KLOR-CON M) 20 MEQ tablet Take 1 tablet (20 mEq total) by mouth daily for 15 days. 03/28/22 07/23/22  Amin, Loura Halt, MD  pregabalin (LYRICA) 100 MG capsule Take 1 capsule (100 mg total) by mouth 2 (two) times daily. 03/11/22   Angiulli, Mcarthur Rossetti, PA-C  scopolamine (TRANSDERM-SCOP) 1 MG/3DAYS Place 1 patch (1.5 mg total) onto the skin every 3 (three) days. Patient taking differently: Place 1 patch onto the skin every three (3) days as needed (for nausea). 03/12/22   Angiulli, Mcarthur Rossetti, PA-C  Semaglutide,0.25 or 0.5MG /DOS, (OZEMPIC, 0.25 OR 0.5 MG/DOSE,) 2 MG/3ML SOPN Inject 0.5 mg into the skin every Wednesday. Patient not taking: Reported on 07/23/2022    [provider]  spironolactone (ALDACTONE) 25 MG tablet Take 1 tablet (25 mg total) by mouth daily. Patient taking differently: Take 25 mg by mouth in the morning. 03/11/22   Angiulli, Mcarthur Rossetti, PA-C  Vitamin D, Ergocalciferol, (DRISDOL) 1.25 MG (50000 UNIT) CAPS capsule Take 1 capsule (50,000 Units total) by mouth once a week. Monday Patient taking differently: Take 50,000 Units by mouth every Monday. 03/11/22   Angiulli, Mcarthur Rossetti, PA-C  XOPENEX HFA 45 MCG/ACT inhaler Inhale 2 puffs into the lungs See admin instructions. Inhale 2 puffs into the lungs every 4-6 hours as  needed for wheezing or shortness of breath    [provider]    Current Facility-Administered Medications  Medication Dose Route Frequency Provider Last Rate Last Admin   0.9 %  sodium chloride infusion   Intravenous Continuous Arnaldo Natal, NP       lactated ringers infusion   Intravenous Continuous Jenel Lucks, MD 50 mL/hr at 10/14/22 0704 New Bag at 10/14/22 0704   Facility-Administered Medications Ordered in Other Encounters  Medication Dose Route Frequency Provider Last Rate Last Admin   acetaminophen (TYLENOL) tablet 650 mg  650 mg Oral Once Thayil, Irene T, PA-C       ferric derisomaltose (MONOFERRIC) 1,000 mg in sodium chloride 0.9 % 100 mL infusion  1,000 mg Intravenous Once Thayil, Irene T, PA-C        Allergies as of 07/23/2022 - Review Complete 07/23/2022  Allergen Reaction Noted   Gabapentin Anaphylaxis, Nausea And Vomiting,  and Other (See Comments) 09/01/2017   Topiramate Other (See Comments) 12/15/2018   Albuterol Other (See Comments) 03/25/2022   Hydroxyzine Other (See Comments) 02/28/2022   Imitrex [sumatriptan] Nausea And Vomiting and Other (See Comments) 08/02/2019   Latex Itching 01/26/2020   Montelukast Other (See Comments) 12/14/2018   Other Itching and Other (See Comments) 04/13/2019   Prozac [fluoxetine] Other (See Comments) 01/26/2020   Trazodone and nefazodone Other (See Comments) 01/26/2020   Advair diskus [fluticasone-salmeterol] Palpitations 01/28/2022   Copper-containing compounds Rash and Other (See Comments) 01/26/2020    Family History  Problem Relation Age of Onset   Diabetes Mother    Hypertension Mother    Hyperlipidemia Mother    Kidney disease Mother        CKD stage 4   Pancreatic cancer Father        pancreatic   Hypertension Maternal Grandmother    Diabetes Maternal Grandmother    Heart failure Maternal Grandmother        CHF   Heart attack Maternal Grandfather    Hypertension Maternal Grandfather     Hypertension Paternal Grandmother    Alzheimer's disease Paternal Grandmother    Hypertension Paternal Grandfather    Cancer Maternal Uncle        melanoma   Cancer Paternal Uncle        kidney   Colon cancer Neg Hx    Esophageal cancer Neg Hx    Stomach cancer Neg Hx     Social History   Socioeconomic History   Marital status: Single    Spouse name: Not on file   Number of children: 0   Years of education: 12   Highest education level: Not on file  Occupational History   Occupation: Chartered certified accountant: [employer]   Occupation: Unemployed  Tobacco Use   Smoking status: Former    Current packs/day: 0.00    Average packs/day: 0.3 packs/day for 5.0 years (1.3 ttl pk-yrs)    Types: Cigarettes    Start date: 03/11/2007    Quit date: 03/10/2012    Years since quitting: 10.6   Smokeless tobacco: Never  Vaping Use   Vaping status: Never Used  Substance and Sexual Activity   Alcohol use: Not Currently    Comment: Reports history of binge drinking; last in 2021   Drug use: Not Currently    Types: Marijuana, Cocaine   Sexual activity: Yes    Partners: Male    Birth control/protection: Condom  Other Topics Concern   Not on file  Social History Narrative   Regular exercise-no   Caffeine Use-yes, 1-2 cups of caffeine daily   Social Determinants of Health   Financial Resource Strain: Unknown (11/19/2020)   Received from [facility] prior to 05/10/2022., [facility] prior to 05/10/2022.   Overall Financial Resource Strain (CARDIA)    Difficulty of Paying Living Expenses: Patient refused  Food Insecurity: Low Risk  (04/07/2022)   [facility]   Food vital sign    Within the past 12 months, you worried that your food would run out before you got money to buy more: Never true    Within the past 12 months, the food you bought just didn't last and you didn't have money to get more: Not on  file  Transportation Needs: Not on file (04/07/2022)  Recent Concern: Transportation Needs - Unmet Transportation Needs (04/07/2022)   [facility]   Transportation    In the past 12 months, has lack of reliable transportation kept you from medical appointments, meetings, work or from getting things needed for daily living? : Yes  Physical Activity: Inactive (11/19/2020)   [facility] from [facility] visits prior to 05/10/2022., [facility] visits prior to 05/10/2022.   Exercise Vital Sign    Days of Exercise per Week: 0 days    Minutes of Exercise per Session: 0 min  Stress: Stress Concern Present (11/19/2020)   [facility] prior to 05/10/2022., [facility] visits prior to 05/10/2022.   [employer] of Occupational Health - Occupational Stress Questionnaire    Feeling of Stress : To some extent  Social Connections: Moderately Isolated (11/19/2020)   [facility] from [facility] visits prior to 05/10/2022., [facility] visits prior to 05/10/2022.   Social Advertising account executive [NHANES]    Frequency of Communication with Friends and Family: More than three times a week    Frequency of Social Gatherings with Friends and Family: Once a week    Attends Religious Services: 1 to 4 times per year    Active Member of [employer] or Organizations: No    Attends Banker Meetings: Never    Marital Status: Never married  Intimate Partner Violence: Not At Risk (02/16/2022)   Humiliation, Afraid, Rape, and Kick questionnaire    Fear of Current or Ex-Partner: No    Emotionally Abused: No    Physically Abused: No    Sexually Abused: No    Review of Systems:  All other review of systems negative except as mentioned in the HPI.  Physical Exam: Vital signs BP 139/61   Pulse 66   Temp 97.7 F (36.5 C) (Temporal)    Resp 16   Ht 5\' 5"  (1.651 m)   Wt (!) 181.4 kg   LMP 12/20/2017   SpO2 97%   BMI 66.56 kg/m   General:   Alert,  Well-developed, well-nourished, pleasant and cooperative in NAD Airway:  Mallampati 1 Lungs:  Clear throughout to auscultation.   Heart:  Regular rate and rhythm; no murmurs, clicks, rubs,  or gallops. Abdomen:  Soft, nontender and nondistended. Normal bowel sounds.   Neuro/Psych:  Normal mood and affect. A and O x 3    E. Tomasa Rand, MD [facility]

## 2022-10-14 NOTE — Op Note (Signed)
[facility] Patient Name: Alyssa Blanchard Procedure Date: 10/14/2022 MRN: [id number] Attending MD: Dub Amis. Tomasa Rand , MD, [id number] Date of Birth: 1970/05/24 CSN: [id number] Age: 52 Admit Type: Outpatient Procedure:                Upper GI endoscopy Indications:              Iron deficiency anemia Providers:                Lorin Picket E. Tomasa Rand, MD, Carlena Hurl,                            Rozetta Nunnery, Technician Referring MD:              Medicines:                Monitored Anesthesia Care Complications:            No immediate complications. Estimated Blood Loss:     Estimated blood loss was minimal. Procedure:                Pre-Anesthesia Assessment:                           - Prior to the procedure, a History and Physical                            was performed, and patient medications and                            allergies were reviewed. The patient's tolerance of                            previous anesthesia was also reviewed. The risks                            and benefits of the procedure and the sedation                            options and risks were discussed with the patient.                            All questions were answered, and informed consent                            was obtained. Prior Anticoagulants: The patient has                            taken Eliquis (apixaban), last dose was 3 days                            prior to procedure. ASA Grade Assessment: III - A                            patient with severe systemic disease. After  reviewing the risks and benefits, the patient was                            deemed in satisfactory condition to undergo the                            procedure.                           After obtaining informed consent, the endoscope was                            passed under direct vision. Throughout the                            procedure, the patient's blood  pressure, pulse, and                            oxygen saturations were monitored continuously. The                            [id number]-[id number] ([id number]) Olympus endoscope was introduced                            through the mouth, and advanced to the third part                            of duodenum. The upper GI endoscopy was                            accomplished without difficulty. The patient                            tolerated the procedure well. Scope In: Scope Out: Findings:      The examined portions of the nasopharynx, oropharynx and larynx were       normal.      The examined esophagus was normal.      Evidence of a sleeve gastrectomy was found in the gastric body. This was       characterized by healthy appearing mucosa.      Diffuse granular mucosa and patchy erythema was found in the gastric       body and in the gastric antrum. Biopsies were taken with a cold forceps       for Helicobacter pylori testing. Estimated blood loss was minimal.      The exam of the stomach was otherwise normal.      The examined duodenum was normal. Biopsies for histology were taken with       a cold forceps for evaluation of celiac disease. Estimated blood loss       was minimal. Impression:               - The examined portions of the nasopharynx,                            oropharynx and larynx were normal.                           -  Normal esophagus.                           - A sleeve gastrectomy was found, characterized by                            healthy appearing mucosa.                           - Granular gastric mucosa. Biopsied.                           - Normal examined duodenum. Biopsied. Moderate Sedation:      Not Applicable - Patient had care per Anesthesia. Recommendation:           - Patient has a contact number available for                            emergencies. The signs and symptoms of potential                            delayed complications were discussed with the                             patient. Return to normal activities tomorrow.                            Written discharge instructions were provided to the                            patient.                           - Resume previous diet.                           - Resume Eliquis (apixaban) at prior dose in 2 days.                           - Await pathology results. Procedure Code(s):        --- Professional ---                           [phone], Esophagogastroduodenoscopy, flexible,                            transoral; with biopsy, single or multiple Diagnosis Code(s):        --- Professional ---                           Z98.84, Bariatric surgery status                           K31.89, Other diseases of stomach and duodenum                           D50.9, Iron deficiency anemia,  unspecified CPT copyright 2022 American Medical Association. All rights reserved. The codes documented in this report are preliminary and upon coder review may  be revised to meet current compliance requirements.  E. Tomasa Rand, MD 10/14/2022 8:23:50 AM This report has been signed electronically. Number of Addenda: 0

## 2022-10-14 NOTE — Transfer of Care (Signed)
Immediate Anesthesia Transfer of Care Note  Patient: Alyssa Blanchard  Procedure(s) Performed: ESOPHAGOGASTRODUODENOSCOPY (EGD) WITH PROPOFOL COLONOSCOPY WITH PROPOFOL BIOPSY POLYPECTOMY  Patient Location: Endoscopy Unit  Anesthesia Type:MAC  Level of Consciousness: awake and patient cooperative  Airway & Oxygen Therapy: Patient Spontanous Breathing and Patient connected to face mask  Post-op Assessment: Report given to RN and Post -op Vital signs reviewed and stable  Post vital signs: Reviewed and stable  Last Vitals:  Vitals Value Taken Time  BP    Temp    Pulse    Resp    SpO2      Last Pain:  Vitals:   10/14/22 0642  TempSrc: Temporal  PainSc: 0-No pain         Complications: No notable events documented.

## 2022-10-14 NOTE — Op Note (Signed)
[facility] Patient Name: Alyssa Blanchard Procedure Date: 10/14/2022 MRN: [id number] Attending MD: Dub Amis. Tomasa Rand , MD, [id number] Date of Birth: November 01, 1970 CSN: [id number] Age: 52 Admit Type: Outpatient Procedure:                Colonoscopy Indications:              Iron deficiency anemia Providers:                Lorin Picket E. Tomasa Rand, MD, Carlena Hurl,                            Rozetta Nunnery, Technician Referring MD:              Medicines:                Monitored Anesthesia Care Complications:            No immediate complications. Estimated Blood Loss:     Estimated blood loss was minimal. Procedure:                Pre-Anesthesia Assessment:                           - Prior to the procedure, a History and Physical                            was performed, and patient medications and                            allergies were reviewed. The patient's tolerance of                            previous anesthesia was also reviewed. The risks                            and benefits of the procedure and the sedation                            options and risks were discussed with the patient.                            All questions were answered, and informed consent                            was obtained. Prior Anticoagulants: The patient has                            taken Eliquis (apixaban), last dose was 3 days                            prior to procedure. ASA Grade Assessment: III - A                            patient with severe systemic disease. After  reviewing the risks and benefits, the patient was                            deemed in satisfactory condition to undergo the                            procedure.                           - Prior to the procedure, a History and Physical                            was performed, and patient medications and                            allergies were reviewed. The patient's  tolerance of                            previous anesthesia was also reviewed. The risks                            and benefits of the procedure and the sedation                            options and risks were discussed with the patient.                            All questions were answered, and informed consent                            was obtained. Prior Anticoagulants: The patient has                            taken Eliquis (apixaban), last dose was 3 days                            prior to procedure. ASA Grade Assessment: III - A                            patient with severe systemic disease. After                            reviewing the risks and benefits, the patient was                            deemed in satisfactory condition to undergo the                            procedure.                           After obtaining informed consent, the colonoscope  was passed under direct vision. Throughout the                            procedure, the patient's blood pressure, pulse, and                            oxygen saturations were monitored continuously. The                            CF-HQ190L ([id number] Olympus colonoscope was                            introduced through the anus and advanced to the the                            terminal ileum, with identification of the                            appendiceal orifice and IC valve. The colonoscopy                            was performed without difficulty. The patient                            tolerated the procedure well. The quality of the                            bowel preparation was adequate. The terminal ileum,                            ileocecal valve, appendiceal orifice, and rectum                            were photographed. Scope In: 7:49:46 AM Scope Out: 8:14:09 AM Scope Withdrawal Time: 0 hours 19 minutes 35 seconds  Total Procedure Duration: 0 hours 24 minutes 23 seconds   Findings:      The perianal and digital rectal examinations were normal. Pertinent       negatives include normal sphincter tone and no palpable rectal lesions.      An 18 mm polyp was found in the proximal ascending colon, along the same       wall and just distal to the ileocecal valve. The polyp was flat. The       polyp was removed with a piecemeal technique using a cold snare.       Resection and retrieval were complete. Estimated blood loss was minimal.      Three sessile polyps were found in the rectum. The polyps were 2 to 6 mm       in size. These polyps were removed with a cold snare. Resection and       retrieval were complete. Estimated blood loss was minimal.      The exam was otherwise normal throughout the examined colon.      The terminal ileum appeared normal.      The retroflexed view of the distal rectum and anal verge was normal and  showed no anal or rectal abnormalities. Impression:               - One 18 mm polyp in the proximal ascending colon,                            removed piecemeal using a cold snare. Resected and                            retrieved.                           - Three 2 to 6 mm polyps in the rectum, removed                            with a cold snare. Resected and retrieved.                           - The examined portion of the ileum was normal.                           - The distal rectum and anal verge are normal on                            retroflexion view. Moderate Sedation:      Not Applicable - Patient had care per Anesthesia. Recommendation:           - Patient has a contact number available for                            emergencies. The signs and symptoms of potential                            delayed complications were discussed with the                            patient. Return to normal activities tomorrow.                            Written discharge instructions were provided to the                             patient.                           - Resume previous diet.                           - Resume Eliquis (apixaban) at prior dose in 2 days.                           - Await pathology results.                           - Repeat colonoscopy (date not yet determined) for  surveillance based on pathology results. Procedure Code(s):        --- Professional ---                           [phone], Colonoscopy, flexible; with removal of                            tumor(s), polyp(s), or other lesion(s) by snare                            technique Diagnosis Code(s):        --- Professional ---                           D12.2, Benign neoplasm of ascending colon                           D12.8, Benign neoplasm of rectum                           D50.9, Iron deficiency anemia, unspecified CPT copyright 2022 [employer]. All rights reserved. The codes documented in this report are preliminary and upon coder review may  be revised to meet current compliance requirements.  E. Tomasa Rand, MD 10/14/2022 8:29:32 AM This report has been signed electronically. Number of Addenda: 0

## 2022-10-14 NOTE — Anesthesia Postprocedure Evaluation (Signed)
Anesthesia Post Note  Patient: Shantrice Renee Callicott  Procedure(s) Performed: ESOPHAGOGASTRODUODENOSCOPY (EGD) WITH PROPOFOL COLONOSCOPY WITH PROPOFOL BIOPSY POLYPECTOMY     Patient location during evaluation: Endoscopy Anesthesia Type: MAC Level of consciousness: awake and alert Pain management: pain level controlled Vital Signs Assessment: post-procedure vital signs reviewed and stable Respiratory status: spontaneous breathing, nonlabored ventilation, respiratory function stable and patient connected to nasal cannula oxygen Cardiovascular status: blood pressure returned to baseline and stable Postop Assessment: no apparent nausea or vomiting Anesthetic complications: no  No notable events documented.  Last Vitals:  Vitals:   10/14/22 0831 10/14/22 0839  BP:  120/69  Pulse: 68 67  Resp: (!) 21 17  Temp:    SpO2: 97% 97%    Last Pain:  Vitals:   10/14/22 0839  TempSrc:   PainSc: 7                   L 

## 2022-10-14 NOTE — Discharge Instructions (Signed)
YOU HAD AN ENDOSCOPIC PROCEDURE TODAY: Refer to the procedure report and other information in the discharge instructions given to you for any specific questions about what was found during the examination. If this information does not answer your questions, please call [place] office at [phone] to clarify.  ° °YOU SHOULD EXPECT: Some feelings of bloating in the abdomen. Passage of more gas than usual. Walking can help get rid of the air that was put into your GI tract during the procedure and reduce the bloating. If you had a lower endoscopy (such as a colonoscopy or flexible sigmoidoscopy) you may notice spotting of blood in your stool or on the toilet paper. Some abdominal soreness may be present for a day or two, also. ° °DIET: Your first meal following the procedure should be a light meal and then it is ok to progress to your normal diet. A half-sandwich or bowl of soup is an example of a good first meal. Heavy or fried foods are harder to digest and may make you feel nauseous or bloated. Drink plenty of fluids but you should avoid alcoholic beverages for 24 hours. If you had a esophageal dilation, please see attached instructions for diet.   ° °ACTIVITY: Your care partner should take you home directly after the procedure. You should plan to take it easy, moving slowly for the rest of the day. You can resume normal activity the day after the procedure however YOU SHOULD NOT DRIVE, use power tools, machinery or perform tasks that involve climbing or major physical exertion for 24 hours (because of the sedation medicines used during the test).  ° °SYMPTOMS TO REPORT IMMEDIATELY: °A gastroenterologist can be reached at any hour. Please call [phone]  for any of the following symptoms:  °Following lower endoscopy (colonoscopy, flexible sigmoidoscopy) °Excessive amounts of blood in the stool  °Significant tenderness, worsening of abdominal pains  °Swelling of the abdomen that is new, acute  °Fever of 100° or  higher  °Following upper endoscopy (EGD, EUS, ERCP, esophageal dilation) °Vomiting of blood or coffee ground material  °New, significant abdominal pain  °New, significant chest pain or pain under the shoulder blades  °Painful or persistently difficult swallowing  °New shortness of breath  °Black, tarry-looking or red, bloody stools ° °FOLLOW UP:  °If any biopsies were taken you will be contacted by phone or by letter within the next 1-3 weeks. Call [phone]  if you have not heard about the biopsies in 3 weeks.  °Please also call with any specific questions about appointments or follow up tests. ° °

## 2022-10-14 NOTE — Anesthesia Procedure Notes (Signed)
Procedure Name: MAC Date/Time: 10/14/2022 7:28 AM  Performed by: Vanessa Macon, CRNAPre-anesthesia Checklist: Patient identified, Emergency Drugs available, Suction available and Patient being monitored Patient Re-evaluated:Patient Re-evaluated prior to induction Oxygen Delivery Method: Simple face mask

## 2022-10-15 LAB — SURGICAL PATHOLOGY

## 2022-10-19 ENCOUNTER — Encounter (HOSPITAL_COMMUNITY): Payer: Self-pay | Admitting: Gastroenterology

## 2022-10-19 NOTE — Progress Notes (Signed)
Ms. Belew,  The biopsies of your duodenum showed inflammation of the duodenum most likely secondary to stomach acid (peptic duodenitis).  There was no evidence of celiac disease. The biopsies taken from your stomach were notable for mild reactive gastropathy which is a common finding and often related to use of certain medications (usually NSAIDs), but there was no evidence of Helicobacter pylori infection. This common finding is not felt to necessarily be a cause of any particular symptom and there is no specific treatment or further evaluation recommended.  The large flat polyp which I removed during your recent procedure was proven to be completely benign but is considered a "pre-cancerous" polyp that MAY have grown into cancer if it had not been removed.  The smaller polyps in the rectum were not precancerous. Studies shows that at least 20% of women over age 17 and 30% of men over age 69 have pre-cancerous polyps.  Based on current nationally recognized surveillance guidelines, I recommend that you have a repeat colonoscopy in 3 years.   If you develop any new rectal bleeding, abdominal pain or significant bowel habit changes, please contact me before then.

## 2022-11-04 ENCOUNTER — Ambulatory Visit: Payer: Medicaid Other | Admitting: Internal Medicine

## 2022-11-25 ENCOUNTER — Ambulatory Visit: Payer: Medicaid Other | Admitting: Internal Medicine

## 2022-12-29 ENCOUNTER — Emergency Department (HOSPITAL_COMMUNITY)
Admission: EM | Admit: 2022-12-29 | Discharge: 2022-12-29 | Discharge status: Against Medical Advice | Payer: Medicaid Other | Attending: Student | Admitting: Student

## 2022-12-29 ENCOUNTER — Other Ambulatory Visit: Payer: Self-pay

## 2022-12-29 ENCOUNTER — Emergency Department (HOSPITAL_COMMUNITY): Payer: Medicaid Other

## 2022-12-29 ENCOUNTER — Encounter (HOSPITAL_COMMUNITY): Payer: Self-pay

## 2022-12-29 DIAGNOSIS — Z5321 Procedure and treatment not carried out due to patient leaving prior to being seen by health care provider: Secondary | ICD-10-CM | POA: Insufficient documentation

## 2022-12-29 DIAGNOSIS — R079 Chest pain, unspecified: Secondary | ICD-10-CM | POA: Diagnosis present

## 2022-12-29 LAB — TROPONIN I (HIGH SENSITIVITY): Troponin I (High Sensitivity): 4 ng/L (ref <18)

## 2022-12-29 LAB — BASIC METABOLIC PANEL
Anion gap: 14 (ref 5–15)
BUN: <5 mg/dL — ABNORMAL LOW (ref 6–20)
CO2: 25 mmol/L (ref 22–32)
Calcium: 9 mg/dL (ref 8.9–10.3)
Chloride: 95 mmol/L — ABNORMAL LOW (ref 98–111)
Creatinine, Ser: 0.59 mg/dL (ref 0.44–1.00)
GFR, Estimated: >60 mL/min (ref >60)
Glucose, Bld: 131 mg/dL — ABNORMAL HIGH (ref 70–99)
Potassium: 3.5 mmol/L (ref 3.5–5.1)
Sodium: 134 mmol/L — ABNORMAL LOW (ref 135–145)

## 2022-12-29 LAB — CBC
HCT: 42.9 % (ref 36.0–46.0)
Hemoglobin: 13.6 g/dL (ref 12.0–15.0)
MCH: 29.2 pg (ref 26.0–34.0)
MCHC: 31.7 g/dL (ref 30.0–36.0)
MCV: 92.1 fL (ref 80.0–100.0)
Platelets: 214 10*3/uL (ref 150–400)
RBC: 4.66 MIL/uL (ref 3.87–5.11)
RDW: 14.8 % (ref 11.5–15.5)
WBC: 9.3 10*3/uL (ref 4.0–10.5)
nRBC: 0 % (ref 0.0–0.2)

## 2022-12-29 NOTE — ED Notes (Signed)
Sent down a dark green as well

## 2022-12-29 NOTE — ED Notes (Signed)
Patient stated "her legs is hurting", I made the nurse aware
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
## 2023-01-02 ENCOUNTER — Other Ambulatory Visit: Payer: Self-pay | Admitting: Gastroenterology

## 2023-01-16 ENCOUNTER — Other Ambulatory Visit: Payer: Self-pay | Admitting: Medical Genetics

## 2023-01-16 DIAGNOSIS — Z006 Encounter for examination for normal comparison and control in clinical research program: Secondary | ICD-10-CM

## 2023-01-20 ENCOUNTER — Other Ambulatory Visit: Payer: Self-pay

## 2023-01-20 ENCOUNTER — Encounter (HOSPITAL_COMMUNITY): Payer: Self-pay

## 2023-01-20 ENCOUNTER — Emergency Department (HOSPITAL_COMMUNITY): Payer: Medicaid Other

## 2023-01-20 ENCOUNTER — Emergency Department (HOSPITAL_COMMUNITY)
Admission: EM | Admit: 2023-01-20 | Discharge: 2023-01-21 | Disposition: A | Discharge status: Home / Self Care | Payer: Medicaid Other | Attending: Emergency Medicine | Admitting: Emergency Medicine

## 2023-01-20 DIAGNOSIS — E119 Type 2 diabetes mellitus without complications: Secondary | ICD-10-CM | POA: Diagnosis not present

## 2023-01-20 DIAGNOSIS — Z91040 Latex allergy status: Secondary | ICD-10-CM | POA: Diagnosis not present

## 2023-01-20 DIAGNOSIS — J45909 Unspecified asthma, uncomplicated: Secondary | ICD-10-CM | POA: Diagnosis not present

## 2023-01-20 DIAGNOSIS — Z87891 Personal history of nicotine dependence: Secondary | ICD-10-CM | POA: Diagnosis not present

## 2023-01-20 DIAGNOSIS — R0602 Shortness of breath: Secondary | ICD-10-CM | POA: Diagnosis present

## 2023-01-20 DIAGNOSIS — J40 Bronchitis, not specified as acute or chronic: Secondary | ICD-10-CM | POA: Diagnosis not present

## 2023-01-20 DIAGNOSIS — Z7901 Long term (current) use of anticoagulants: Secondary | ICD-10-CM | POA: Diagnosis not present

## 2023-01-20 DIAGNOSIS — I480 Paroxysmal atrial fibrillation: Secondary | ICD-10-CM | POA: Insufficient documentation

## 2023-01-20 DIAGNOSIS — Z79899 Other long term (current) drug therapy: Secondary | ICD-10-CM | POA: Insufficient documentation

## 2023-01-20 DIAGNOSIS — I10 Essential (primary) hypertension: Secondary | ICD-10-CM | POA: Insufficient documentation

## 2023-01-20 LAB — BASIC METABOLIC PANEL
Anion gap: 12 (ref 5–15)
BUN: 9 mg/dL (ref 6–20)
CO2: 23 mmol/L (ref 22–32)
Calcium: 9 mg/dL (ref 8.9–10.3)
Chloride: 101 mmol/L (ref 98–111)
Creatinine, Ser: 0.78 mg/dL (ref 0.44–1.00)
GFR, Estimated: >60 mL/min (ref >60)
Glucose, Bld: 161 mg/dL — ABNORMAL HIGH (ref 70–99)
Potassium: 3.8 mmol/L (ref 3.5–5.1)
Sodium: 136 mmol/L (ref 135–145)

## 2023-01-20 LAB — CBC
HCT: 37.6 % (ref 36.0–46.0)
Hemoglobin: 11.6 g/dL — ABNORMAL LOW (ref 12.0–15.0)
MCH: 29.1 pg (ref 26.0–34.0)
MCHC: 30.9 g/dL (ref 30.0–36.0)
MCV: 94.2 fL (ref 80.0–100.0)
Platelets: 195 10*3/uL (ref 150–400)
RBC: 3.99 MIL/uL (ref 3.87–5.11)
RDW: 14.9 % (ref 11.5–15.5)
WBC: 6.9 10*3/uL (ref 4.0–10.5)
nRBC: 0 % (ref 0.0–0.2)

## 2023-01-20 LAB — TROPONIN I (HIGH SENSITIVITY): Troponin I (High Sensitivity): 5 ng/L (ref <18)

## 2023-01-20 MED ORDER — BENZONATATE 100 MG PO CAPS: 100.0000 mg | ORAL_CAPSULE | Freq: Three times a day (TID) | ORAL | 0 refills | Status: DC

## 2023-01-20 NOTE — ED Triage Notes (Signed)
Pt has had shortness of breath and cough x 2 weeks. Pt has seen UC and PCP for same. Pt was told to come to ED if it did not improve w/antibiotics. Pt finished z-pack today.

## 2023-01-20 NOTE — Discharge Instructions (Addendum)
Your CT scan that was done of your chest was normal.  You do not have a pneumonia.  Your blood work was also normal.  You likely have bronchitis, which can often take several weeks to improve.  He can take Tessalon Perles for cough.  Follow-up with your primary care doctor.

## 2023-01-20 NOTE — ED Provider Notes (Signed)
[place] [facility] AT [facility] Provider Note  CSN: [id number] Arrival date & time: 01/20/23 1431  Chief Complaint(s) Shortness of Breath  HPI Alyssa Blanchard is a 52 y.o. female here today for cough.  Is been ongoing for 2 weeks.  She saw her primary care doctor about this 2 weeks ago, was started on azithromycin.  She has not improved.  She says that she has had "a low-grade temperature."   Past Medical History Past Medical History:  Diagnosis Date   Anxiety    Asthma    Atrial fibrillation (HCC)    Depression    DM (diabetes mellitus), type 2 (HCC) 02/16/2022   Dysrhythmia    new onset Afib rvr   GERD (gastroesophageal reflux disease)    Heart failure (HCC)    Heel spur    bilat   History of bronchitis    History of chicken pox    History of urinary tract infection    Hypertension    Migraines    Plantar fasciitis    bilat   STD (sexually transmitted disease)    chl hx & hsv 1&2   Tremors of nervous system    Urinary incontinence    Patient Active Problem List   Diagnosis Date Noted   Rectal bleeding 10/14/2022   Anxiety 06/30/2022   Debility 02/28/2022   Prolonged QT interval 02/17/2022   Cellulitis of right leg 02/16/2022   Lumbar radiculopathy, right 02/16/2022   Anemia 02/16/2022   Iron deficiency anemia 02/02/2022   Type 2 diabetes mellitus without complication, without long-term current use of insulin (HCC) 12/25/2021   Chronic combined systolic and diastolic congestive heart failure (HCC) 09/25/2019   Acute pulmonary edema (HCC)    Paroxysmal atrial fibrillation (HCC) 08/02/2019   Bipolar disorder, in full remission, most recent episode manic (HCC) 04/15/2019   Psychosis (HCC) 04/14/2019   Not well controlled moderate persistent asthma 02/02/2018   Chronic rhinitis 02/02/2018   Status asthmaticus 11/29/2017   Acute respiratory failure with hypoxia (HCC) 11/29/2017   Gastroesophageal reflux disease 09/01/2017   Cough  12/31/2016   Dysuria 07/28/2016   Clinical infection 07/28/2016   History of asthma 04/17/2016   Acute bronchitis 04/17/2016   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 Authorizing Provider  acetaminophen (TYLENOL) 500 MG tablet Take 1,000 mg by mouth See admin instructions. Take 1,000 mg by mouth in the morning and at bedtime and an additional 1,000 mg once a day as needed for pain    [provider]  apixaban (ELIQUIS) 5 MG TABS tablet Take 1 tablet (5 mg total) by mouth 2 (two) times daily. 10/16/22 11/15/22  Jenel Lucks, MD  ARIPiprazole (ABILIFY) 5 MG tablet Take 1 tablet (5 mg total) by mouth daily. Patient not taking: Reported on 07/23/2022 07/10/22 09/08/22  Theodoro Kos A  bumetanide (BUMEX) 1 MG tablet Take 1 tablet (1 mg total) by mouth daily. Patient taking differently: Take 1 mg by mouth daily as needed (swelling). 03/11/22   Angiulli, Mcarthur Rossetti,  PA-C  Dupilumab (DUPIXENT) 300 MG/2ML SOPN Inject 300 mg into the skin every 14 (fourteen) days.    [provider]  fluticasone furoate-vilanterol (BREO ELLIPTA) 200-25 MCG/ACT AEPB Inhale 1 puff into the lungs daily. 03/11/22   Angiulli, Mcarthur Rossetti, PA-C  glipiZIDE (GLUCOTROL) 10 MG tablet Take 10 mg by mouth 2 (two) times daily before a meal. 12/02/21 10/14/22  [provider]  levalbuterol (XOPENEX) 1.25 MG/0.5ML nebulizer solution Take 1.25 mg by nebulization every 8 (eight) hours as needed for wheezing or  shortness of breath. 03/28/22   Amin, Ankit C, MD  Melatonin 10 MG TABS Take 10 mg by mouth at bedtime.    [provider]  Multiple Vitamins-Minerals (BARIATRIC MULTIVITAMINS/IRON PO) Take 1 tablet by mouth daily with breakfast.    [provider]  nystatin (MYCOSTATIN/NYSTOP) powder Apply topically 2 (two) times daily. Patient taking differently: Apply 1 Application topically daily as needed (skin irritation). 02/01/20   Armandina Stammer I, NP  OPCON-A 0.027-0.315 % SOLN Place 1 drop into both eyes 2 (two) times daily as needed (for allergies or irritation).    [provider]  pantoprazole (PROTONIX) 40 MG tablet Take 1 tablet (40 mg total) by mouth 2 (two) times daily. 03/11/22   Angiulli, Mcarthur Rossetti, PA-C  polyethylene glycol powder (GLYCOLAX/MIRALAX) 17 GM/SCOOP powder Take 17 g by mouth daily as needed for mild constipation. 01/10/22   [provider]  potassium chloride SA (KLOR-CON M) 20 MEQ tablet Take 1 tablet (20 mEq total) by mouth daily for 15 days. 03/28/22 07/23/22  Amin, Ankit C, MD  pregabalin (LYRICA) 100 MG capsule Take 1 capsule (100 mg total) by mouth 2 (two) times daily. 03/11/22   Angiulli, Mcarthur Rossetti, PA-C  propranolol (INDERAL) 60 MG tablet Take 1 tablet (60 mg total) by mouth 3 (three) times daily. 03/11/22   Angiulli, Mcarthur Rossetti, PA-C  scopolamine (TRANSDERM-SCOP) 1 MG/3DAYS Place 1 patch (1.5 mg total) onto the skin every 3 (three) days. Patient taking differently: Place 1 patch onto the skin every three (3) days as needed (for nausea). 03/12/22   Angiulli, Mcarthur Rossetti, PA-C  Semaglutide,0.25 or 0.5MG /DOS, (OZEMPIC, 0.25 OR 0.5 MG/DOSE,) 2 MG/3ML SOPN Inject 0.5 mg into the skin every Wednesday. Patient not taking: Reported on 07/23/2022    [provider]  spironolactone (ALDACTONE) 25 MG tablet Take 1 tablet (25 mg total) by mouth daily. Patient taking differently: Take 25 mg by mouth in the morning. 03/11/22   Angiulli, Mcarthur Rossetti, PA-C  Vitamin D,  Ergocalciferol, (DRISDOL) 1.25 MG (50000 UNIT) CAPS capsule Take 1 capsule (50,000 Units total) by mouth once a week. Monday Patient taking differently: Take 50,000 Units by mouth every Monday. 03/11/22   Angiulli, Mcarthur Rossetti, PA-C  VOLTAREN ARTHRITIS PAIN 1 % GEL Apply 2 g topically See admin instructions. Apply 2 grams to affected areas in the morning and at bedtime 01/20/22   [provider]  XOPENEX HFA 45 MCG/ACT inhaler Inhale 2 puffs into the lungs See admin instructions. Inhale 2 puffs into the lungs every 4-6 hours as needed for wheezing or shortness of breath    [provider]  ZYRTEC ALLERGY 10 MG tablet Take 10 mg by mouth daily as needed (for seasonal allergies).    [provider]  Past Surgical History Past Surgical History:  Procedure Laterality Date   BIOPSY  10/14/2022   Procedure: BIOPSY;  Surgeon: Jenel Lucks, MD;  Location: [facility];  Service: Gastroenterology;;   CARDIOVERSION N/A 08/05/2019   Procedure: CARDIOVERSION;  Surgeon: Vesta Mixer, MD;  Location: [facility] ENDOSCOPY;  Service: Cardiovascular;  Laterality: N/A;   COLONOSCOPY WITH PROPOFOL N/A 10/14/2022   Procedure: COLONOSCOPY WITH PROPOFOL;  Surgeon: Jenel Lucks, MD;  Location: [facility] ENDOSCOPY;  Service: Gastroenterology;  Laterality: N/A;   ESOPHAGOGASTRODUODENOSCOPY (EGD) WITH PROPOFOL N/A 10/14/2022   Procedure: ESOPHAGOGASTRODUODENOSCOPY (EGD) WITH PROPOFOL;  Surgeon: Jenel Lucks, MD;  Location: [facility] ENDOSCOPY;  Service: Gastroenterology;  Laterality: N/A;   LAPAROSCOPIC GASTRIC SLEEVE RESECTION N/A 11/27/2014   Procedure: LAPAROSCOPIC GASTRIC SLEEVE RESECTION WITH HIATAL HERNIA REPAIR UPPER ENDOSCOPY;  Surgeon: Luretha Murphy, MD;  Location: [facility] ORS;  Service: General;  Laterality: N/A;   POLYPECTOMY  10/14/2022   Procedure: POLYPECTOMY;  Surgeon:  Jenel Lucks, MD;  Location: [facility] ENDOSCOPY;  Service: Gastroenterology;;   TEE WITHOUT CARDIOVERSION N/A 08/05/2019   Procedure: TRANSESOPHAGEAL ECHOCARDIOGRAM (TEE);  Surgeon: Vesta Mixer, MD;  Location: [facility] ENDOSCOPY;  Service: Cardiovascular;  Laterality: N/A;   TONSILLECTOMY  1978   Family History Family History  Problem Relation Age of Onset   Diabetes Mother    Hypertension Mother    Hyperlipidemia Mother    Kidney disease Mother        CKD stage 4   Pancreatic cancer Father        pancreatic   Hypertension Maternal Grandmother    Diabetes Maternal Grandmother    Heart failure Maternal Grandmother        CHF   Heart attack Maternal Grandfather    Hypertension Maternal Grandfather    Hypertension Paternal Grandmother    Alzheimer's disease Paternal Grandmother    Hypertension Paternal Grandfather    Cancer Maternal Uncle        melanoma   Cancer Paternal Uncle        kidney   Colon cancer Neg Hx    Esophageal cancer Neg Hx    Stomach cancer Neg Hx     Social History Social History   Tobacco Use   Smoking status: Former    Current packs/day: 0.00    Average packs/day: 0.3 packs/day for 5.0 years (1.3 ttl pk-yrs)    Types: Cigarettes    Start date: 03/11/2007    Quit date: 03/10/2012    Years since quitting: 10.8   Smokeless tobacco: Never  Vaping Use   Vaping status: Never Used  Substance Use Topics   Alcohol use: Not Currently    Comment: Reports history of binge drinking; last in 2021   Drug use: Not Currently    Types: Marijuana, Cocaine   Allergies Gabapentin, Topiramate, Albuterol, Hydroxyzine, Imitrex [sumatriptan], Latex, Montelukast, Other, Prozac [fluoxetine], Trazodone and nefazodone, Advair diskus [fluticasone-salmeterol], and Copper-containing compounds  Review of Systems Review of Systems  Physical Exam Vital Signs  I have reviewed the triage vital signs BP (!) 142/110 (BP Location: Right Arm)   Pulse 75   Temp 98.8 F (37.1 C)    Resp (!) 22   Ht 5\' 5"  (1.651 m)   Wt (!) 186.9 kg   LMP 12/20/2017   SpO2 96%   BMI 68.56 kg/m   Physical Exam Vitals reviewed.  HENT:     Head: Normocephalic and atraumatic.  Eyes:     Extraocular Movements: Extraocular movements intact.  Pupils: Pupils are equal, round, and reactive to light.  Cardiovascular:     Rate and Rhythm: Normal rate.  Pulmonary:     Effort: Pulmonary effort is normal. No tachypnea.     Breath sounds: Wheezing present. No decreased breath sounds.  Chest:     Chest wall: No mass.  Abdominal:     Palpations: Abdomen is soft.  Musculoskeletal:        General: Normal range of motion.     Cervical back: Normal range of motion.  Skin:    General: Skin is warm and dry.  Neurological:     General: No focal deficit present.     Mental Status: She is alert.     ED Results and Treatments Labs (all labs ordered are listed, but only abnormal results are displayed) Labs Reviewed  BASIC METABOLIC PANEL - Abnormal; Notable for the following components:      Result Value   Glucose, Bld 161 (*)    All other components within normal limits  CBC - Abnormal; Notable for the following components:   Hemoglobin 11.6 (*)    All other components within normal limits  TROPONIN I (HIGH SENSITIVITY)  TROPONIN I (HIGH SENSITIVITY)                                                                                                                          Radiology No results found.  Pertinent labs & imaging results that were available during my care of the patient were reviewed by me and considered in my medical decision making (see MDM for details).  Medications Ordered in ED Medications - No data to display                                                                                                                                   Procedures Procedures  (including critical care time)  Medical Decision Making / ED Course   This patient presents to  the ED for concern of cough, this involves an extensive number of treatment options, and is a complaint that carries with it a high risk of complications and morbidity.  The differential diagnosis includes pneumonia, walking pneumonia, viral syndrome.  MDM: My independent review of the patient's chest x-ray does not show focal consolidation.  Patient does have wheezing, more pronounced in her right lower lung than her left.  Currently him seeing the fair  amount of patients with similar symptoms, suspicious for walking pneumonia.  Will obtain CT imaging of the chest given equivocal chest x-ray.  Bloodwork does not show any leukocytosis.  Reassessment 9:50 PM-CT imaging of the chest negative.  Patient nontachypneic, nonhypoxic.  Overall looks well.  Advised patient that this was likely viral syndrome, which should improve over the next few days.  She says that she did not want steroids because she says this makes her A-fib worse.  Additional history obtained: -Additional history obtained from  -External records from outside source obtained and reviewed including: Chart review including previous notes, labs, imaging, consultation notes   Lab Tests: -I ordered, reviewed, and interpreted labs.   The pertinent results include:   Labs Reviewed  BASIC METABOLIC PANEL - Abnormal; Notable for the following components:      Result Value   Glucose, Bld 161 (*)    All other components within normal limits  CBC - Abnormal; Notable for the following components:   Hemoglobin 11.6 (*)    All other components within normal limits  TROPONIN I (HIGH SENSITIVITY)  TROPONIN I (HIGH SENSITIVITY)      EKG my independent review the patient's EKG shows no ST segment depressions or elevations, no T wave versions, no evidence of acute ischemia.  EKG Interpretation Date/Time:    Ventricular Rate:    PR Interval:    QRS Duration:    QT Interval:    QTC Calculation:   R Axis:      Text Interpretation:            Imaging Studies ordered: I ordered imaging studies including CT imaging of the chest, plain films of the chest I independently visualized and interpreted imaging. I agree with the radiologist interpretation   Medicines ordered and prescription drug management: No orders of the defined types were placed in this encounter.   -I have reviewed the patients home medicines and have made adjustments as needed   Cardiac Monitoring: The patient was maintained on a cardiac monitor.  I personally viewed and interpreted the cardiac monitored which showed an underlying rhythm of: Sinus rhythm     Reevaluation: After the interventions noted above, I reevaluated the patient and found that they have :improved  Co morbidities that complicate the patient evaluation  Past Medical History:  Diagnosis Date   Anxiety    Asthma    Atrial fibrillation (HCC)    Depression    DM (diabetes mellitus), type 2 (HCC) 02/16/2022   Dysrhythmia    new onset Afib rvr   GERD (gastroesophageal reflux disease)    Heart failure (HCC)    Heel spur    bilat   History of bronchitis    History of chicken pox    History of urinary tract infection    Hypertension    Migraines    Plantar fasciitis    bilat   STD (sexually transmitted disease)    chl hx & hsv 1&2   Tremors of nervous system    Urinary incontinence       Dispostion: Discharge     Final Clinical Impression(s) / ED Diagnoses Final diagnoses:  None     @PCDICTATION @    Anders Simmonds T, DO 01/20/23 2156

## 2023-02-12 ENCOUNTER — Other Ambulatory Visit (HOSPITAL_COMMUNITY): Payer: Medicaid Other | Attending: Medical Genetics

## 2023-02-20 ENCOUNTER — Ambulatory Visit: Payer: Medicaid Other | Admitting: Family Medicine

## 2023-04-23 ENCOUNTER — Inpatient Hospital Stay (HOSPITAL_COMMUNITY)
Admission: EM | Admit: 2023-04-23 | Discharge: 2023-04-30 | DRG: 291 | Disposition: A | Discharge status: Rehabilitation Facility | Payer: Medicaid Other | Attending: Internal Medicine | Admitting: Internal Medicine

## 2023-04-23 ENCOUNTER — Emergency Department (HOSPITAL_COMMUNITY): Payer: Medicaid Other

## 2023-04-23 ENCOUNTER — Other Ambulatory Visit: Payer: Self-pay

## 2023-04-23 DIAGNOSIS — Z8249 Family history of ischemic heart disease and other diseases of the circulatory system: Secondary | ICD-10-CM

## 2023-04-23 DIAGNOSIS — R531 Weakness: Secondary | ICD-10-CM | POA: Diagnosis present

## 2023-04-23 DIAGNOSIS — Z1629 Resistance to other single specified antibiotic: Secondary | ICD-10-CM | POA: Diagnosis present

## 2023-04-23 DIAGNOSIS — Z7901 Long term (current) use of anticoagulants: Secondary | ICD-10-CM

## 2023-04-23 DIAGNOSIS — I5083 High output heart failure: Secondary | ICD-10-CM | POA: Diagnosis present

## 2023-04-23 DIAGNOSIS — I4819 Other persistent atrial fibrillation: Secondary | ICD-10-CM | POA: Diagnosis not present

## 2023-04-23 DIAGNOSIS — I493 Ventricular premature depolarization: Secondary | ICD-10-CM | POA: Diagnosis not present

## 2023-04-23 DIAGNOSIS — D696 Thrombocytopenia, unspecified: Secondary | ICD-10-CM | POA: Diagnosis not present

## 2023-04-23 DIAGNOSIS — Z888 Allergy status to other drugs, medicaments and biological substances status: Secondary | ICD-10-CM

## 2023-04-23 DIAGNOSIS — N390 Urinary tract infection, site not specified: Secondary | ICD-10-CM | POA: Diagnosis present

## 2023-04-23 DIAGNOSIS — Z7984 Long term (current) use of oral hypoglycemic drugs: Secondary | ICD-10-CM

## 2023-04-23 DIAGNOSIS — I480 Paroxysmal atrial fibrillation: Secondary | ICD-10-CM | POA: Diagnosis present

## 2023-04-23 DIAGNOSIS — J45909 Unspecified asthma, uncomplicated: Secondary | ICD-10-CM | POA: Diagnosis present

## 2023-04-23 DIAGNOSIS — I5042 Chronic combined systolic (congestive) and diastolic (congestive) heart failure: Secondary | ICD-10-CM | POA: Diagnosis not present

## 2023-04-23 DIAGNOSIS — I272 Pulmonary hypertension, unspecified: Secondary | ICD-10-CM | POA: Diagnosis present

## 2023-04-23 DIAGNOSIS — F32A Depression, unspecified: Secondary | ICD-10-CM | POA: Diagnosis present

## 2023-04-23 DIAGNOSIS — R5381 Other malaise: Secondary | ICD-10-CM | POA: Diagnosis present

## 2023-04-23 DIAGNOSIS — E66813 Obesity, class 3: Secondary | ICD-10-CM | POA: Diagnosis not present

## 2023-04-23 DIAGNOSIS — Z6841 Body Mass Index (BMI) 40.0 and over, adult: Secondary | ICD-10-CM | POA: Diagnosis not present

## 2023-04-23 DIAGNOSIS — K219 Gastro-esophageal reflux disease without esophagitis: Secondary | ICD-10-CM | POA: Diagnosis present

## 2023-04-23 DIAGNOSIS — I110 Hypertensive heart disease with heart failure: Secondary | ICD-10-CM | POA: Diagnosis present

## 2023-04-23 DIAGNOSIS — E871 Hypo-osmolality and hyponatremia: Secondary | ICD-10-CM | POA: Diagnosis not present

## 2023-04-23 DIAGNOSIS — N760 Acute vaginitis: Secondary | ICD-10-CM | POA: Diagnosis present

## 2023-04-23 DIAGNOSIS — N3000 Acute cystitis without hematuria: Secondary | ICD-10-CM | POA: Diagnosis not present

## 2023-04-23 DIAGNOSIS — R0602 Shortness of breath: Secondary | ICD-10-CM

## 2023-04-23 DIAGNOSIS — F418 Other specified anxiety disorders: Secondary | ICD-10-CM | POA: Diagnosis not present

## 2023-04-23 DIAGNOSIS — Z808 Family history of malignant neoplasm of other organs or systems: Secondary | ICD-10-CM

## 2023-04-23 DIAGNOSIS — Z833 Family history of diabetes mellitus: Secondary | ICD-10-CM

## 2023-04-23 DIAGNOSIS — I10 Essential (primary) hypertension: Secondary | ICD-10-CM

## 2023-04-23 DIAGNOSIS — Z1152 Encounter for screening for COVID-19: Secondary | ICD-10-CM

## 2023-04-23 DIAGNOSIS — E8770 Fluid overload, unspecified: Principal | ICD-10-CM

## 2023-04-23 DIAGNOSIS — R509 Fever, unspecified: Secondary | ICD-10-CM | POA: Insufficient documentation

## 2023-04-23 DIAGNOSIS — Z820 Family history of epilepsy and other diseases of the nervous system: Secondary | ICD-10-CM

## 2023-04-23 DIAGNOSIS — L03115 Cellulitis of right lower limb: Secondary | ICD-10-CM | POA: Diagnosis present

## 2023-04-23 DIAGNOSIS — D509 Iron deficiency anemia, unspecified: Secondary | ICD-10-CM | POA: Diagnosis present

## 2023-04-23 DIAGNOSIS — Z560 Unemployment, unspecified: Secondary | ICD-10-CM

## 2023-04-23 DIAGNOSIS — E119 Type 2 diabetes mellitus without complications: Secondary | ICD-10-CM

## 2023-04-23 DIAGNOSIS — I4719 Other supraventricular tachycardia: Secondary | ICD-10-CM | POA: Diagnosis not present

## 2023-04-23 DIAGNOSIS — Z87440 Personal history of urinary (tract) infections: Secondary | ICD-10-CM

## 2023-04-23 DIAGNOSIS — R197 Diarrhea, unspecified: Secondary | ICD-10-CM | POA: Diagnosis not present

## 2023-04-23 DIAGNOSIS — E1165 Type 2 diabetes mellitus with hyperglycemia: Secondary | ICD-10-CM | POA: Diagnosis not present

## 2023-04-23 DIAGNOSIS — Z87891 Personal history of nicotine dependence: Secondary | ICD-10-CM | POA: Diagnosis not present

## 2023-04-23 DIAGNOSIS — E876 Hypokalemia: Secondary | ICD-10-CM | POA: Diagnosis present

## 2023-04-23 DIAGNOSIS — I4891 Unspecified atrial fibrillation: Secondary | ICD-10-CM | POA: Diagnosis not present

## 2023-04-23 DIAGNOSIS — Z800 Family history of malignant neoplasm of digestive organs: Secondary | ICD-10-CM

## 2023-04-23 DIAGNOSIS — Z5982 Transportation insecurity: Secondary | ICD-10-CM

## 2023-04-23 DIAGNOSIS — I890 Lymphedema, not elsewhere classified: Secondary | ICD-10-CM | POA: Diagnosis present

## 2023-04-23 DIAGNOSIS — I5032 Chronic diastolic (congestive) heart failure: Secondary | ICD-10-CM | POA: Diagnosis not present

## 2023-04-23 DIAGNOSIS — Z841 Family history of disorders of kidney and ureter: Secondary | ICD-10-CM

## 2023-04-23 DIAGNOSIS — E662 Morbid (severe) obesity with alveolar hypoventilation: Secondary | ICD-10-CM | POA: Diagnosis present

## 2023-04-23 DIAGNOSIS — A0811 Acute gastroenteropathy due to Norwalk agent: Secondary | ICD-10-CM

## 2023-04-23 DIAGNOSIS — Z9884 Bariatric surgery status: Secondary | ICD-10-CM

## 2023-04-23 DIAGNOSIS — R262 Difficulty in walking, not elsewhere classified: Secondary | ICD-10-CM | POA: Diagnosis present

## 2023-04-23 DIAGNOSIS — L309 Dermatitis, unspecified: Secondary | ICD-10-CM | POA: Diagnosis present

## 2023-04-23 DIAGNOSIS — K5900 Constipation, unspecified: Secondary | ICD-10-CM | POA: Diagnosis not present

## 2023-04-23 DIAGNOSIS — Z7985 Long-term (current) use of injectable non-insulin antidiabetic drugs: Secondary | ICD-10-CM

## 2023-04-23 DIAGNOSIS — R9431 Abnormal electrocardiogram [ECG] [EKG]: Secondary | ICD-10-CM | POA: Diagnosis present

## 2023-04-23 DIAGNOSIS — I5033 Acute on chronic diastolic (congestive) heart failure: Secondary | ICD-10-CM | POA: Diagnosis present

## 2023-04-23 DIAGNOSIS — Z83438 Family history of other disorder of lipoprotein metabolism and other lipidemia: Secondary | ICD-10-CM

## 2023-04-23 DIAGNOSIS — M1990 Unspecified osteoarthritis, unspecified site: Secondary | ICD-10-CM | POA: Diagnosis present

## 2023-04-23 DIAGNOSIS — D508 Other iron deficiency anemias: Secondary | ICD-10-CM | POA: Diagnosis not present

## 2023-04-23 DIAGNOSIS — L732 Hidradenitis suppurativa: Secondary | ICD-10-CM | POA: Diagnosis present

## 2023-04-23 DIAGNOSIS — Z79899 Other long term (current) drug therapy: Secondary | ICD-10-CM

## 2023-04-23 DIAGNOSIS — Z91040 Latex allergy status: Secondary | ICD-10-CM

## 2023-04-23 DIAGNOSIS — T502X5A Adverse effect of carbonic-anhydrase inhibitors, benzothiadiazides and other diuretics, initial encounter: Secondary | ICD-10-CM | POA: Diagnosis not present

## 2023-04-23 DIAGNOSIS — B9620 Unspecified Escherichia coli [E. coli] as the cause of diseases classified elsewhere: Secondary | ICD-10-CM | POA: Diagnosis present

## 2023-04-23 DIAGNOSIS — B379 Candidiasis, unspecified: Secondary | ICD-10-CM | POA: Diagnosis not present

## 2023-04-23 DIAGNOSIS — F419 Anxiety disorder, unspecified: Secondary | ICD-10-CM | POA: Diagnosis present

## 2023-04-23 DIAGNOSIS — Z7951 Long term (current) use of inhaled steroids: Secondary | ICD-10-CM

## 2023-04-23 LAB — CBC
HCT: 30.6 % — ABNORMAL LOW (ref 36.0–46.0)
Hemoglobin: 9.3 g/dL — ABNORMAL LOW (ref 12.0–15.0)
MCH: 25.8 pg — ABNORMAL LOW (ref 26.0–34.0)
MCHC: 30.4 g/dL (ref 30.0–36.0)
MCV: 85 fL (ref 80.0–100.0)
Platelets: 215 10*3/uL (ref 150–400)
RBC: 3.6 MIL/uL — ABNORMAL LOW (ref 3.87–5.11)
RDW: 17 % — ABNORMAL HIGH (ref 11.5–15.5)
WBC: 10.9 10*3/uL — ABNORMAL HIGH (ref 4.0–10.5)
nRBC: 0 % (ref 0.0–0.2)

## 2023-04-23 LAB — COMPREHENSIVE METABOLIC PANEL
ALT: 28 U/L (ref 0–44)
AST: 44 U/L — ABNORMAL HIGH (ref 15–41)
Albumin: 2.9 g/dL — ABNORMAL LOW (ref 3.5–5.0)
Alkaline Phosphatase: 88 U/L (ref 38–126)
Anion gap: 11 (ref 5–15)
BUN: 6 mg/dL (ref 6–20)
CO2: 28 mmol/L (ref 22–32)
Calcium: 8.4 mg/dL — ABNORMAL LOW (ref 8.9–10.3)
Chloride: 98 mmol/L (ref 98–111)
Creatinine, Ser: 0.84 mg/dL (ref 0.44–1.00)
GFR, Estimated: >60 mL/min (ref >60)
Glucose, Bld: 128 mg/dL — ABNORMAL HIGH (ref 70–99)
Potassium: 3.1 mmol/L — ABNORMAL LOW (ref 3.5–5.1)
Sodium: 137 mmol/L (ref 135–145)
Total Bilirubin: 1.1 mg/dL (ref 0.0–1.2)
Total Protein: 6.4 g/dL — ABNORMAL LOW (ref 6.5–8.1)

## 2023-04-23 LAB — RESP PANEL BY RT-PCR (RSV, FLU A&B, COVID)  RVPGX2
Influenza A by PCR: NEGATIVE
Influenza B by PCR: NEGATIVE
Resp Syncytial Virus by PCR: NEGATIVE
SARS Coronavirus 2 by RT PCR: NEGATIVE

## 2023-04-23 LAB — TROPONIN I (HIGH SENSITIVITY)
Troponin I (High Sensitivity): 7 ng/L (ref <18)
Troponin I (High Sensitivity): 7 ng/L (ref <18)

## 2023-04-23 LAB — BRAIN NATRIURETIC PEPTIDE: B Natriuretic Peptide: 66.4 pg/mL (ref 0.0–100.0)

## 2023-04-23 MED ORDER — MIDODRINE HCL 5 MG PO TABS
5.0000 mg | ORAL_TABLET | Freq: Three times a day (TID) | ORAL | Status: DC
  Administered 2023-04-24 (×2): 5 mg via ORAL
  Filled 2023-04-23 (×2): qty 1

## 2023-04-23 NOTE — ED Provider Notes (Signed)
  EMERGENCY DEPARTMENT AT [facility] Provider Note   CSN: [id number] Arrival date & time: 04/23/23  1440     History  Chief Complaint  Patient presents with   Shortness of Breath    Alyssa Blanchard is a 53 y.o. female PMH asthma, T2DM, obesity, GERD, PAF s/p TEE/DCCV on elqiuis, HFpEF (EF 60% in 02/2023)  Presents with increased leg swelling and shortness of breath.  They report that their symptoms started a week after their last hospital discharge, where they were treated for fluid overload secondary to heart failure.  States during hospitalization "lost 67 pounds of fluid during that hospitalization and believe they have gained more than that since discharge." They have been adherent to their prescribed diuretics, Bumex and Spironolactone, and have been on fluid restriction.  States that her PCP increased her Bumex last week and she takes 2 pills in the morning and 1 pill at night.   She states that she has not been able to keep the fluid off of her since her hospitalization. They also report pain in their right leg, which they describe as "hurting a ton," and difficulty moving it due to the swelling.  They have been taking antibiotics (doxycycline) for hidradenitis suppurativa. Denies any missed doses of eliquis   Shortness of Breath Associated symptoms: no abdominal pain, no chest pain, no cough, no ear pain, no fever, no rash, no sore throat and no vomiting       Home Medications Prior to Admission medications   Medication Sig Start Date End Date Taking? Authorizing Provider  acetaminophen (TYLENOL) 500 MG tablet Take 1,000 mg by mouth See admin instructions. Take 1,000 mg by mouth in the morning and at bedtime and an additional 1,000 mg once a day as needed for pain    [provider]  apixaban (ELIQUIS) 5 MG TABS tablet Take 1 tablet (5 mg total) by mouth 2 (two) times daily. 10/16/22 11/15/22  Jenel Lucks, MD  ARIPiprazole (ABILIFY) 5  MG tablet Take 1 tablet (5 mg total) by mouth daily. Patient not taking: Reported on 07/23/2022 07/10/22 09/08/22  Theodoro Kos A  benzonatate (TESSALON) 100 MG capsule Take 1 capsule (100 mg total) by mouth every 8 (eight) hours. 01/20/23   Arletha Pili, DO  bumetanide (BUMEX) 1 MG tablet Take 1 tablet (1 mg total) by mouth daily. Patient taking differently: Take 1 mg by mouth daily as needed (swelling). 03/11/22   Angiulli, Mcarthur Rossetti, PA-C  Dupilumab (DUPIXENT) 300 MG/2ML SOPN Inject 300 mg into the skin every 14 (fourteen) days.    [provider]  fluticasone furoate-vilanterol (BREO ELLIPTA) 200-25 MCG/ACT AEPB Inhale 1 puff into the lungs daily. 03/11/22   Angiulli, Mcarthur Rossetti, PA-C  glipiZIDE (GLUCOTROL) 10 MG tablet Take 10 mg by mouth 2 (two) times daily before a meal. 12/02/21 10/14/22  [provider]  levalbuterol (XOPENEX) 1.25 MG/0.5ML nebulizer solution Take 1.25 mg by nebulization every 8 (eight) hours as needed for wheezing or shortness of breath. 03/28/22   Amin, Ankit C, MD  Melatonin 10 MG TABS Take 10 mg by mouth at bedtime.    [provider]  Multiple Vitamins-Minerals (BARIATRIC MULTIVITAMINS/IRON PO) Take 1 tablet by mouth daily with breakfast.    [provider]  nystatin (MYCOSTATIN/NYSTOP) powder Apply topically 2 (two) times daily. Patient taking differently: Apply 1 Application topically daily as needed (skin irritation). 02/01/20   Armandina Stammer I, NP  OPCON-A 0.027-0.315 % SOLN Place 1  drop into both eyes 2 (two) times daily as needed (for allergies or irritation).    [provider]  pantoprazole (PROTONIX) 40 MG tablet Take 1 tablet (40 mg total) by mouth 2 (two) times daily. 03/11/22   Angiulli, Mcarthur Rossetti, PA-C  polyethylene glycol powder (GLYCOLAX/MIRALAX) 17 GM/SCOOP powder Take 17 g by mouth daily as needed for mild constipation. 01/10/22   [provider]  potassium chloride SA (KLOR-CON M) 20 MEQ tablet Take 1 tablet (20  mEq total) by mouth daily for 15 days. 03/28/22 07/23/22  Amin, Ankit C, MD  pregabalin (LYRICA) 100 MG capsule Take 1 capsule (100 mg total) by mouth 2 (two) times daily. 03/11/22   Angiulli, Mcarthur Rossetti, PA-C  propranolol (INDERAL) 60 MG tablet Take 1 tablet (60 mg total) by mouth 3 (three) times daily. 03/11/22   Angiulli, Mcarthur Rossetti, PA-C  scopolamine (TRANSDERM-SCOP) 1 MG/3DAYS Place 1 patch (1.5 mg total) onto the skin every 3 (three) days. Patient taking differently: Place 1 patch onto the skin every three (3) days as needed (for nausea). 03/12/22   Angiulli, Mcarthur Rossetti, PA-C  Semaglutide,0.25 or 0.5MG /DOS, (OZEMPIC, 0.25 OR 0.5 MG/DOSE,) 2 MG/3ML SOPN Inject 0.5 mg into the skin every Wednesday. Patient not taking: Reported on 07/23/2022    [provider]  spironolactone (ALDACTONE) 25 MG tablet Take 1 tablet (25 mg total) by mouth daily. Patient taking differently: Take 25 mg by mouth in the morning. 03/11/22   Angiulli, Mcarthur Rossetti, PA-C  Vitamin D, Ergocalciferol, (DRISDOL) 1.25 MG (50000 UNIT) CAPS capsule Take 1 capsule (50,000 Units total) by mouth once a week. Monday Patient taking differently: Take 50,000 Units by mouth every Monday. 03/11/22   Angiulli, Mcarthur Rossetti, PA-C  VOLTAREN ARTHRITIS PAIN 1 % GEL Apply 2 g topically See admin instructions. Apply 2 grams to affected areas in the morning and at bedtime 01/20/22   [provider]  XOPENEX HFA 45 MCG/ACT inhaler Inhale 2 puffs into the lungs See admin instructions. Inhale 2 puffs into the lungs every 4-6 hours as needed for wheezing or shortness of breath    [provider]  ZYRTEC ALLERGY 10 MG tablet Take 10 mg by mouth daily as needed (for seasonal allergies).    [provider]      Allergies    Gabapentin, Topiramate, Albuterol, Hydroxyzine, Imitrex [sumatriptan], Latex, Montelukast, Other, Prozac [fluoxetine], Trazodone and nefazodone, Advair diskus [fluticasone-salmeterol], and Copper-containing compounds     Review of Systems   Review of Systems  Constitutional:  Negative for chills and fever.  HENT:  Negative for ear pain and sore throat.   Eyes:  Negative for pain and visual disturbance.  Respiratory:  Positive for shortness of breath. Negative for cough.   Cardiovascular:  Negative for chest pain and palpitations.  Gastrointestinal:  Negative for abdominal pain and vomiting.  Genitourinary:  Negative for dysuria and hematuria.  Musculoskeletal:  Positive for myalgias. Negative for arthralgias and back pain.  Skin:  Negative for color change and rash.  Neurological:  Negative for seizures and syncope.  All other systems reviewed and are negative.   Physical Exam Updated Vital Signs BP (!) 98/43 Comment: Provider informed.  Pulse 99   Temp 99.8 F (37.7 C) (Oral)   Resp (!) 21   Ht 5\' 5"  (1.651 m)   Wt (!) 200.9 kg   LMP 12/20/2017   SpO2 98%   BMI 73.70 kg/m  Physical Exam Vitals and nursing note reviewed.  Constitutional:  General: She is not in acute distress.    Appearance: She is well-developed.  HENT:     Head: Normocephalic and atraumatic.  Eyes:     Conjunctiva/sclera: Conjunctivae normal.  Cardiovascular:     Rate and Rhythm: Normal rate and regular rhythm.     Heart sounds: No murmur heard. Pulmonary:     Effort: Pulmonary effort is normal. Tachypnea present. No accessory muscle usage or respiratory distress.     Breath sounds: Decreased breath sounds present.  Abdominal:     Palpations: Abdomen is soft.     Tenderness: There is no abdominal tenderness.  Musculoskeletal:        General: No swelling.     Cervical back: Neck supple.     Right lower leg: Edema present.     Left lower leg: Edema present.     Comments: No calf tenderness, skin folds with erythema but no ulceration present.  2+ pitting to thighs bilaterally  Skin:    General: Skin is warm and dry.     Capillary Refill: Capillary refill takes less than 2 seconds.  Neurological:      Mental Status: She is alert.  Psychiatric:        Mood and Affect: Mood normal.     ED Results / Procedures / Treatments   Labs (all labs ordered are listed, but only abnormal results are displayed) Labs Reviewed  CBC - Abnormal; Notable for the following components:      Result Value   WBC 10.9 (*)    RBC 3.60 (*)    Hemoglobin 9.3 (*)    HCT 30.6 (*)    MCH 25.8 (*)    RDW 17.0 (*)    All other components within normal limits  COMPREHENSIVE METABOLIC PANEL - Abnormal; Notable for the following components:   Potassium 3.1 (*)    Glucose, Bld 128 (*)    Calcium 8.4 (*)    Total Protein 6.4 (*)    Albumin 2.9 (*)    AST 44 (*)    All other components within normal limits  RESP PANEL BY RT-PCR (RSV, FLU A&B, COVID)  [id number]  BRAIN NATRIURETIC PEPTIDE  TROPONIN I (HIGH SENSITIVITY)  TROPONIN I (HIGH SENSITIVITY)    EKG EKG Interpretation Date/Time:  Thursday April 23 2023 16:47:14 EST Ventricular Rate:  86 PR Interval:    QRS Duration:  95 QT Interval:  409 QTC Calculation: 490 R Axis:   39  Text Interpretation: sinus Low voltage, precordial leads Borderline prolonged QT interval when compared to prior, faster rate but still sinus. No STEMI Confirmed by Theda Belfast ([id number]) on 04/23/2023 4:52:56 PM  Radiology DG Chest Port 1 View Result Date: 04/23/2023 CLINICAL DATA:  Shortness of breath, fluid overload EXAM: PORTABLE CHEST 1 VIEW COMPARISON:  03/10/2023 FINDINGS: Mild enlargement of the cardiopericardial silhouette. Airway thickening is present, suggesting bronchitis or reactive airways disease. Stable bandlike densities in both mid lungs favoring scarring. No blunting of the costophrenic angles. Indistinct pulmonary vasculature, cannot exclude pulmonary venous hypertension. No overt edema. Lower thoracic spondylosis. IMPRESSION: 1. Mild enlargement of the cardiopericardial silhouette with indistinct pulmonary vasculature, cannot exclude pulmonary venous  hypertension. No overt edema. 2. Airway thickening is present, suggesting bronchitis or reactive airways disease. 3. Stable bandlike densities in both mid lungs favoring scarring. Electronically Signed   By: Gaylyn Rong M.D.   On: 04/23/2023 17:54    Procedures Procedures   Medications Ordered in ED Medications - No data to display  ED Course/ Medical Decision Making/ A&P                                Medical Decision Making Amount and/or Complexity of Data Reviewed Labs: ordered. Radiology: ordered.  Risk Decision regarding hospitalization.   Medical Decision Making:   Gisell Buehrle is a 53 y.o. female who presented to the ED today with dyspnea and difficulty ambulating detailed above.    Additional history discussed with patient's family/caregivers.  External chart has been reviewed including past hospitalization. Patient's presentation is complicated by their history of HFpEF, PAF.  Complete initial physical exam performed, notably the patient  was dyspneic with any exertion.    Reviewed and confirmed nursing documentation for past medical history, family history, social history.    Initial Assessment:   With the patient's presentation of leg swelling, dyspnea, most likely diagnosis is HFpEF exacerbation. Other diagnoses were considered including (but not limited to) DVT/PE, MI, deconditioning. These are considered less likely due to history of present illness and physical exam findings.   This is most consistent with an acute life/limb threatening illness complicated by underlying chronic conditions.  Initial Plan:  BNP, troponin, respiratory panel Screening labs including CBC and Metabolic panel to evaluate for infectious or metabolic etiology of disease.  Urinalysis with reflex culture ordered to evaluate for UTI or relevant urologic/nephrologic pathology.  CXR to evaluate for structural/infectious intrathoracic pathology.  EKG to evaluate for cardiac  pathology Objective evaluation as below reviewed   Initial Study Results:   Laboratory  All laboratory results reviewed without evidence of clinically relevant pathology.   Exceptions include: mild leukocytosis 10.9, anemia -hgb to 9.3, K3.1, AST 44  EKG EKG was reviewed independently. Intermittently irregular. Rate, axis, intervals all examined and without medically relevant abnormality. ST segments without concerns for elevations.    Radiology:  All images reviewed independently. Agree with radiology report at this time.   DG Chest Port 1 View Result Date: 04/23/2023 CLINICAL DATA:  Shortness of breath, fluid overload EXAM: PORTABLE CHEST 1 VIEW COMPARISON:  03/10/2023 FINDINGS: Mild enlargement of the cardiopericardial silhouette. Airway thickening is present, suggesting bronchitis or reactive airways disease. Stable bandlike densities in both mid lungs favoring scarring. No blunting of the costophrenic angles. Indistinct pulmonary vasculature, cannot exclude pulmonary venous hypertension. No overt edema. Lower thoracic spondylosis. IMPRESSION: 1. Mild enlargement of the cardiopericardial silhouette with indistinct pulmonary vasculature, cannot exclude pulmonary venous hypertension. No overt edema. 2. Airway thickening is present, suggesting bronchitis or reactive airways disease. 3. Stable bandlike densities in both mid lungs favoring scarring. Electronically Signed   By: Gaylyn Rong M.D.   On: 04/23/2023 17:54   Final Assessment and Plan:   53 year old here with significant dyspnea on exertion and lower extremity swelling. Has failed outpatient oral diuretics even with increasing outpatient 1 week ago. Spoke with hospitalist Dr. Allena Katz about admission - given hypotension concern for needing ICU/pressors. Patient is getting pressures checked on her wrist here due to habitus. After speaking with nursing able to get more accurate BP 101/59 although still on upper forearm. Overall patient  very well appearing-suspect cuff most likely causing lower BP reads. Hospitalist accepted admission.    Clinical Impression:  1. Hypervolemia, unspecified hypervolemia type   2. SOB (shortness of breath)      Admit   Final Clinical Impression(s) / ED Diagnoses Final diagnoses:  Hypervolemia, unspecified hypervolemia type  SOB (shortness of breath)  Rx / DC Orders ED Discharge Orders     None         Levin Erp, MD 04/23/23 2358    Tegeler, Canary Brim, MD 04/30/23 [id number]

## 2023-04-23 NOTE — ED Triage Notes (Signed)
Patient BIB [facility] from home for exertional [facility] and fluid overload causing difficulty ambulating. Patient reports that the fluid in her knees is causing discomfort making it hard for her to walk, patient recently had medication changes and since then reports symptoms are worse. Hx of CHF and DMII.

## 2023-04-23 NOTE — Progress Notes (Addendum)
VAST consult. Assessed bilateral arms with [place]. No appropriate vein found at this time for PIV/midline. Call light on no nurse to room. Notified nurse via secure chat. Alcide Clever, RN VAST

## 2023-04-23 NOTE — ED Notes (Addendum)
Patient placed in hospital bed for comfort with assist x4. Per unit secretary, they attempted to reach out to service response for a bariatric bed and were told there were none available.

## 2023-04-23 NOTE — H&P (Signed)
History and Physical    Alyssa Blanchard MWN:[id number] DOB: 1970-10-04 DOA: 04/23/2023  PCP: Randel Pigg, Dorma Russell, MD  Patient coming from: Home  I have personally briefly reviewed patient's old medical records in [facility]  Chief Complaint: Shortness of breath, weight and fluid gain  HPI: Alyssa Blanchard is a 53 y.o. female with medical history significant for chronic HFpEF, PAF on Eliquis, asthma, T2DM, HTN, depression/anxiety, morbid obesity who presented to the ED for evaluation of shortness of breath and lower extremity swelling.  Patient was recently admitted at [facility] 03/11/2023-03/17/2023 for acute on chronic HFpEF exacerbation.  She was diuresed aggressively.  Per discharge summary she was 17.5 L net deficit.  Weight was 206 kg on admission down to 179 kg on discharge.  Patient states that she has been taking her medications as prescribed including Bumex 2 mg daily since she was discharged.  She says about 1 week after leaving the hospital she began to have increasing fluid buildup again.  She has had progressive shortness of breath occurring while at rest.  Dyspnea somewhat improved with head elevated.  Very short of breath with minimal activity.  She has been ambulating with use of a walker.  She says about 1 week ago her Bumex was advised to take additional Bumex 1 mg at night in addition to 2 mg every morning.  Despite this she has not had any improvement in her edema.  She reports decreased urine output than expected.  ED Course  Labs/Imaging on admission: I have personally reviewed following labs and imaging studies.  Initial vitals showed BP 116/41, pulse 89, RR 20, temp 98.6 F, SpO2 100% on room air.  Labs show sodium 137, potassium 3.1, bicarb 28, BUN 6, creatinine 0.84, serum glucose 128, BNP 66.4, troponin 7 x 2, WBC 10.9, hemoglobin 9.3, platelets 215,000.  Portable chest x-ray showed mild enlargement of the cardiopericardial silhouette with indistinct  pulmonary vasculature.  The hospitalist service was consulted to admit.  Review of Systems: All systems reviewed and are negative except as documented in history of present illness above.   Past Medical History:  Diagnosis Date   Anxiety    Asthma    Atrial fibrillation (HCC)    Depression    DM (diabetes mellitus), type 2 (HCC) 02/16/2022   Dysrhythmia    new onset Afib rvr   GERD (gastroesophageal reflux disease)    Heart failure (HCC)    Heel spur    bilat   History of bronchitis    History of chicken pox    History of urinary tract infection    Hypertension    Migraines    Plantar fasciitis    bilat   STD (sexually transmitted disease)    chl hx & hsv 1&2   Tremors of nervous system    Urinary incontinence     Past Surgical History:  Procedure Laterality Date   BIOPSY  10/14/2022   Procedure: BIOPSY;  Surgeon: Jenel Lucks, MD;  Location: [facility] END[facility]COPY;  Service: Gastroenterology;;   CARDIOVERSION N/A 08/05/2019   Procedure: CARDIOVERSION;  Surgeon: Vesta Mixer, MD;  Location: [facility] ENDOSCOPY;  Service: Cardiovascular;  Laterality: N/A;   COLONOSCOPY WITH PROPOFOL N/A 10/14/2022   Procedure: COLONOSCOPY WITH PROPOFOL;  Surgeon: Jenel Lucks, MD;  Location: [facility] ENDOSCOPY;  Service: Gastroenterology;  Laterality: N/A;   ESOPHAGOGASTRODUODENOSCOPY (EGD) WITH PROPOFOL N/A 10/14/2022   Procedure: ESOPHAGOGASTRODUODENOSCOPY (EGD) WITH PROPOFOL;  Surgeon: Jenel Lucks, MD;  Location: [facility] ENDOSCOPY;  Service: Gastroenterology;  Laterality: N/A;   LAPAROSCOPIC GASTRIC SLEEVE RESECTION N/A 11/27/2014   Procedure: LAPAROSCOPIC GASTRIC SLEEVE RESECTION WITH HIATAL HERNIA REPAIR UPPER ENDOSCOPY;  Surgeon: Luretha Murphy, MD;  Location: [facility] ORS;  Service: General;  Laterality: N/A;   POLYPECTOMY  10/14/2022   Procedure: POLYPECTOMY;  Surgeon: Jenel Lucks, MD;  Location: [facility] ENDOSCOPY;  Service: Gastroenterology;;   TEE WITHOUT CARDIOVERSION N/A 08/05/2019    Procedure: TRANSESOPHAGEAL ECHOCARDIOGRAM (TEE);  Surgeon: [name] Deloris Ping, MD;  Location: [facility] ENDOSCOPY;  Service: Cardiovascular;  Laterality: N/A;   TONSILLECTOMY  1978    Social History:  reports that she quit smoking about 11 years ago. Her smoking use included cigarettes. She started smoking about 16 years ago. She has a 1.3 pack-year smoking history. She has never used smokeless tobacco. She reports that she does not currently use alcohol. She reports that she does not currently use drugs after having used the following drugs: Marijuana and Cocaine.  Allergies  Allergen Reactions   Gabapentin Anaphylaxis, Nausea And Vomiting and Other (See Comments)    "Cannot walk"   Topiramate Other (See Comments)    Jaw tightness and chest discomfort    Albuterol Other (See Comments)    CAN tolerate levalbuterol   Hydroxyzine Other (See Comments)    QT prolongation related   Imitrex [Sumatriptan] Nausea And Vomiting and Other (See Comments)    Increased heart rate   Latex Itching   Montelukast Other (See Comments)    Nightmares    Other Itching and Other (See Comments)    Plastic and artificial Christmas trees = Itching   Prozac [Fluoxetine] Other (See Comments)    Bad, vivid dreams   Trazodone And Nefazodone Other (See Comments)    Bad dreams 01/27/20 Pt is unsure if allergic to Nefazodone   Advair Diskus [Fluticasone-Salmeterol] Palpitations   Copper-Containing Compounds Rash and Other (See Comments)    Makes the face burn, also    Family History  Problem Relation Age of Onset   Diabetes Mother    Hypertension Mother    Hyperlipidemia Mother    Kidney disease Mother        CKD stage 4   Pancreatic cancer Father        pancreatic   Hypertension Maternal Grandmother    Diabetes Maternal Grandmother    Heart failure Maternal Grandmother        CHF   Heart attack Maternal Grandfather    Hypertension Maternal Grandfather    Hypertension Paternal Grandmother    Alzheimer's  disease Paternal Grandmother    Hypertension Paternal Grandfather    Cancer Maternal Uncle        melanoma   Cancer Paternal Uncle        kidney   Colon cancer Neg Hx    Esophageal cancer Neg Hx    Stomach cancer Neg Hx      Prior to Admission medications   Medication Sig Start Date End Date Taking? Authorizing Provider  acetaminophen (TYLENOL) 500 MG tablet Take 1,000 mg by mouth See admin instructions. Take 1,000 mg by mouth in the morning and at bedtime and an additional 1,000 mg once a day as needed for pain    [provider]  apixaban (ELIQUIS) 5 MG TABS tablet Take 1 tablet (5 mg total) by mouth 2 (two) times daily. 10/16/22 11/15/22  Jenel Lucks, MD  ARIPiprazole (ABILIFY) 5 MG tablet Take 1 tablet (5 mg total) by mouth daily. Patient not taking: Reported on  07/23/2022 07/10/22 09/08/22  Bahraini, Sarah A  benzonatate (TESSALON) 100 MG capsule Take 1 capsule (100 mg total) by mouth every 8 (eight) hours. 01/20/23   Arletha Pili, DO  bumetanide (BUMEX) 1 MG tablet Take 1 tablet (1 mg total) by mouth daily. Patient taking differently: Take 1 mg by mouth daily as needed (swelling). 03/11/22   Angiulli, Mcarthur Rossetti, PA-C  Dupilumab (DUPIXENT) 300 MG/2ML SOPN Inject 300 mg into the skin every 14 (fourteen) days.    [provider]  fluticasone furoate-vilanterol (BREO ELLIPTA) 200-25 MCG/ACT AEPB Inhale 1 puff into the lungs daily. 03/11/22   Angiulli, Mcarthur Rossetti, PA-C  glipiZIDE (GLUCOTROL) 10 MG tablet Take 10 mg by mouth 2 (two) times daily before a meal. 12/02/21 10/14/22  [provider]  levalbuterol (XOPENEX) 1.25 MG/0.5ML nebulizer solution Take 1.25 mg by nebulization every 8 (eight) hours as needed for wheezing or shortness of breath. 03/28/22   Amin, Ankit C, MD  Melatonin 10 MG TABS Take 10 mg by mouth at bedtime.    [provider]  Multiple Vitamins-Minerals (BARIATRIC MULTIVITAMINS/IRON PO) Take 1 tablet by mouth daily with breakfast.     [provider]  nystatin (MYCOSTATIN/NYSTOP) powder Apply topically 2 (two) times daily. Patient taking differently: Apply 1 Application topically daily as needed (skin irritation). 02/01/20   Armandina Stammer I, NP  OPCON-A 0.027-0.315 % SOLN Place 1 drop into both eyes 2 (two) times daily as needed (for allergies or irritation).    [provider]  pantoprazole (PROTONIX) 40 MG tablet Take 1 tablet (40 mg total) by mouth 2 (two) times daily. 03/11/22   Angiulli, Mcarthur Rossetti, PA-C  polyethylene glycol powder (GLYCOLAX/MIRALAX) 17 GM/SCOOP powder Take 17 g by mouth daily as needed for mild constipation. 01/10/22   [provider]  potassium chloride SA (KLOR-CON M) 20 MEQ tablet Take 1 tablet (20 mEq total) by mouth daily for 15 days. 03/28/22 07/23/22  Amin, Ankit C, MD  pregabalin (LYRICA) 100 MG capsule Take 1 capsule (100 mg total) by mouth 2 (two) times daily. 03/11/22   Angiulli, Mcarthur Rossetti, PA-C  propranolol (INDERAL) 60 MG tablet Take 1 tablet (60 mg total) by mouth 3 (three) times daily. 03/11/22   Angiulli, Mcarthur Rossetti, PA-C  scopolamine (TRANSDERM-SCOP) 1 MG/3DAYS Place 1 patch (1.5 mg total) onto the skin every 3 (three) days. Patient taking differently: Place 1 patch onto the skin every three (3) days as needed (for nausea). 03/12/22   Angiulli, Mcarthur Rossetti, PA-C  Semaglutide,0.25 or 0.5MG /DOS, (OZEMPIC, 0.25 OR 0.5 MG/DOSE,) 2 MG/3ML SOPN Inject 0.5 mg into the skin every Wednesday. Patient not taking: Reported on 07/23/2022    [provider]  spironolactone (ALDACTONE) 25 MG tablet Take 1 tablet (25 mg total) by mouth daily. Patient taking differently: Take 25 mg by mouth in the morning. 03/11/22   Angiulli, Mcarthur Rossetti, PA-C  Vitamin D, Ergocalciferol, (DRISDOL) 1.25 MG (50000 UNIT) CAPS capsule Take 1 capsule (50,000 Units total) by mouth once a week. Monday Patient taking differently: Take 50,000 Units by mouth every Monday. 03/11/22   Angiulli, Mcarthur Rossetti, PA-C  VOLTAREN  ARTHRITIS PAIN 1 % GEL Apply 2 g topically See admin instructions. Apply 2 grams to affected areas in the morning and at bedtime 01/20/22   [provider]  XOPENEX HFA 45 MCG/ACT inhaler Inhale 2 puffs into the lungs See admin instructions. Inhale 2 puffs into the lungs every 4-6 hours as needed for wheezing or shortness of breath  [provider]  ZYRTEC ALLERGY 10 MG tablet Take 10 mg by mouth daily as needed (for seasonal allergies).    [provider]    Physical Exam: Vitals:   04/23/23 2120 04/23/23 2230 04/23/23 2345 04/24/23 0030  BP: (!) 98/43 (!) 83/36 (!) 122/44 (!) 109/51  Pulse: 99 100 94 93  Resp: (!) 21 17 17 11   Temp:      TempSrc:      SpO2: 98% 98% 99% 100%  Weight:      Height:       Constitutional: Morbidly obese woman resting in bed.  NAD, calm. Eyes: EOMI, lids and conjunctivae normal ENMT: Mucous membranes are moist. Posterior pharynx clear of any exudate or lesions.Normal dentition.  Neck: normal, supple, no masses. Respiratory: clear to auscultation bilaterally, no wheezing, no crackles. Normal respiratory effort while on 2 L O2 via [place]. No accessory muscle use.  Cardiovascular: Regular rate and rhythm, systolic murmur present.  Large body habitus with massive lower extremities with superimposed edema. Abdomen: no tenderness, no masses palpated.  Musculoskeletal: no clubbing / cyanosis. No joint deformity upper and lower extremities. Good ROM, no contractures. Normal muscle tone.  Skin: no rashes, lesions, ulcers. No induration Neurologic:  Sensation intact. Strength 5/5 in all 4.  Psychiatric: Alert and oriented x 3. Normal mood.   EKG: Personally reviewed. Sinus rhythm, rate 86, no acute ischemic changes.  Similar to prior.  Assessment/Plan Principal Problem:   Acute on chronic heart failure with preserved ejection fraction (HFpEF) (HCC) Active Problems:   Paroxysmal atrial fibrillation (HCC)   Type 2 diabetes mellitus  without complication, without long-term current use of insulin (HCC)   Asthma   Depression with anxiety   Alyssa Blanchard is a 53 y.o. female with medical history significant for chronic HFpEF, PAF on Eliquis, asthma, T2DM, HTN, depression/anxiety, morbid obesity who is admitted with acute on chronic HFpEF.  Assessment and Plan: Acute on chronic HFpEF: Presenting with progressive dyspnea, weight gain, peripheral edema.  BNP of 66.4 limited in context of morbid obesity.  Recent admit that [facility], weight was 179 kg on discharge.  Weight on admit here is 200.9 kg.  There have been some low blood pressure readings, however accuracy has been limited due to body habitus.  TTE at OSH on 03/10/2023 showed EF 60-65%, moderate pulmonary HTN. -Start on IV Lasix 40 mg twice daily -Start on midodrine for BP support with ongoing diuresis -Strict I/O's and daily weights -Holding spironolactone  Paroxysmal atrial fibrillation: Sinus rhythm with controlled rate.  Continue Eliquis, further meds on hold pending med rec.  Hypertension: BP readings have been intermittently low, although some difficulty with accuracy given body habitus.  For now we will hold antihypertensives while starting IV diuretics.  Midodrine added for BP support as above.  Asthma: Stable.  Continue Breo Ellipta and Xopenex inhaler as needed.  Type 2 diabetes: Last hemoglobin A1c 6.7% on 03/09/2023.  Holding home meds and placed on SSI.  Depression/anxiety: Resume home meds once med rec completed.  Morbid obesity: Body mass index is 73.7 kg/m. Complicates medical management.  May have underlying OSA/OHS.  Patient reports she is scheduled for a sleep study in April.   DVT prophylaxis:  apixaban (ELIQUIS) tablet 5 mg   Code Status: Full code, confirmed with patient on admission Family Communication: Discussed with patient, she has discussed with family Disposition Plan: From home, dispo pending clinical progress Consults  called: None Severity of Illness: The appropriate patient status  for this patient is INPATIENT. Inpatient status is judged to be reasonable and necessary in order to provide the required intensity of service to ensure the patient's safety. The patient's presenting symptoms, physical exam findings, and initial radiographic and laboratory data in the context of their chronic comorbidities is felt to place them at high risk for further clinical deterioration. Furthermore, it is not anticipated that the patient will be medically stable for discharge from the hospital within 2 midnights of admission.   * I certify that at the point of admission it is my clinical judgment that the patient will require inpatient hospital care spanning beyond 2 midnights from the point of admission due to high intensity of service, high risk for further deterioration and high frequency of surveillance required.Darreld Mclean MD [facility]  If 7PM-7AM, please contact night-coverage [email or web address]  04/24/2023, 12:57 AM

## 2023-04-23 NOTE — ED Notes (Signed)
Unable to get labs. RN aware

## 2023-04-23 NOTE — ED Notes (Signed)
Provider informed of pt request for pain medication prior to bedding change due to incontinence.

## 2023-04-23 NOTE — H&P (Incomplete)
History and Physical    Alyssa Blanchard ZOX:[id number] DOB: 1970/08/15 DOA: 04/23/2023  PCP: Randel Pigg, Dorma Russell, MD  Patient coming from: Home  I have personally briefly reviewed patient's old medical records in [facility]  Chief Complaint: ***  HPI: Alyssa Blanchard is a 53 y.o. female with medical history significant for chronic HFpEF, PAF on Eliquis, asthma, T2DM, HTN, depression/anxiety, morbid obesity who presented to the ED for evaluation of shortness of breath and lower extremity swelling.  Patient was recently admitted at [facility] 03/11/2023-03/17/2023 for acute on chronic HFpEF exacerbation.  She was diuresed aggressively.  Per discharge summary she was 17.5 L net deficit.  Weight was 206 kg on admission down to 179 kg on discharge.***  ED Course  Labs/Imaging on admission: I have personally reviewed following labs and imaging studies.  Initial vitals showed BP 116/41, pulse 89, RR 20, temp 98.6 F, SpO2 100% on room air.  Labs show sodium 137, potassium 3.1, bicarb 28, BUN 6, creatinine 0.84, serum glucose 128, BNP 66.4, troponin 7 x 2, WBC 10.9, hemoglobin 9.3, platelets 215,000.  Portable chest x-ray showed mild enlargement of the cardiopericardial silhouette with indistinct pulmonary vasculature.  The hospitalist service was consulted to admit.***  Review of Systems: All systems reviewed and are negative except as documented in history of present illness above.   Past Medical History:  Diagnosis Date  . Anxiety   . Asthma   . Atrial fibrillation (HCC)   . Depression   . DM (diabetes mellitus), type 2 (HCC) 02/16/2022  . Dysrhythmia    new onset Afib rvr  . GERD (gastroesophageal reflux disease)   . Heart failure (HCC)   . Heel spur    bilat  . History of bronchitis   . History of chicken pox   . History of urinary tract infection   . Hypertension   . Migraines   . Plantar fasciitis    bilat  . STD (sexually transmitted disease)    chl hx & hsv  1&2  . Tremors of nervous system   . Urinary incontinence     Past Surgical History:  Procedure Laterality Date  . BIOPSY  10/14/2022   Procedure: BIOPSY;  Surgeon: Jenel Lucks, MD;  Location: [facility] ENDOSCOPY;  Service: Gastroenterology;;  . CARDIOVERSION N/A 08/05/2019   Procedure: CARDIOVERSION;  Surgeon: Vesta Mixer, MD;  [facility] ENDOSCOPY;  Service: Cardiovascular;  Laterality: N/A;  . COLONOSCOPY WITH PROPOFOL N/A 10/14/2022   Procedure: COLONOSCOPY WITH PROPOFOL;  Surgeon: Jenel Lucks, MD;  Location: [facility] ENDOSCOPY;  Service: Gastroenterology;  Laterality: N/A;  . ESOPHAGOGASTRODUODENOSCOPY (EGD) WITH PROPOFOL N/A 10/14/2022   Procedure: ESOPHAGOGASTRODUODENOSCOPY (EGD) WITH PROPOFOL;  Surgeon: Jenel Lucks, MD;  Location: [facility] ENDOSCOPY;  Service: Gastroenterology;  Laterality: N/A;  . LAPAROSCOPIC GASTRIC SLEEVE RESECTION N/A 11/27/2014   Procedure: LAPAROSCOPIC GASTRIC SLEEVE RESECTION WITH HIATAL HERNIA REPAIR UPPER ENDOSCOPY;  Surgeon: Luretha Murphy, MD;  Location: [facility] ORS;  Service: General;  Laterality: N/A;  . POLYPECTOMY  10/14/2022   Procedure: POLYPECTOMY;  Surgeon: Jenel Lucks, MD;  Location: [facility] ENDOSCOPY;  Service: Gastroenterology;;  . TEE WITHOUT CARDIOVERSION N/A 08/05/2019   Procedure: TRANSESOPHAGEAL ECHOCARDIOGRAM (TEE);  Surgeon: [name] Deloris Ping, MD;  Location: [facility] ENDOSCOPY;  Service: Cardiovascular;  Laterality: N/A;  . TONSILLECTOMY  1978    Social History:  reports that she quit smoking about 11 years ago. Her smoking use included cigarettes. She started smoking about 16 years ago. She has a  1.3 pack-year smoking history. She has never used smokeless tobacco. She reports that she does not currently use alcohol. She reports that she does not currently use drugs after having used the following drugs: Marijuana and Cocaine.  Allergies  Allergen Reactions  . Gabapentin Anaphylaxis, Nausea And Vomiting and Other (See Comments)    "Cannot  walk"  . Topiramate Other (See Comments)    Jaw tightness and chest discomfort   . Albuterol Other (See Comments)    CAN tolerate levalbuterol  . Hydroxyzine Other (See Comments)    QT prolongation related  . Imitrex [Sumatriptan] Nausea And Vomiting and Other (See Comments)    Increased heart rate  . Latex Itching  . Montelukast Other (See Comments)    Nightmares   . Other Itching and Other (See Comments)    Plastic and artificial Christmas trees = Itching  . Prozac [Fluoxetine] Other (See Comments)    Bad, vivid dreams  . Trazodone And Nefazodone Other (See Comments)    Bad dreams 01/27/20 Pt is unsure if allergic to Nefazodone  . Advair Diskus [Fluticasone-Salmeterol] Palpitations  . Copper-Containing Compounds Rash and Other (See Comments)    Makes the face burn, also    Family History  Problem Relation Age of Onset  . Diabetes Mother   . Hypertension Mother   . Hyperlipidemia Mother   . Kidney disease Mother        CKD stage 4  . Pancreatic cancer Father        pancreatic  . Hypertension Maternal Grandmother   . Diabetes Maternal Grandmother   . Heart failure Maternal Grandmother        CHF  . Heart attack Maternal Grandfather   . Hypertension Maternal Grandfather   . Hypertension Paternal Grandmother   . Alzheimer's disease Paternal Grandmother   . Hypertension Paternal Grandfather   . Cancer Maternal Uncle        melanoma  . Cancer Paternal Uncle        kidney  . Colon cancer Neg Hx   . Esophageal cancer Neg Hx   . Stomach cancer Neg Hx      Prior to Admission medications   Medication Sig Start Date End Date Taking? Authorizing Provider  acetaminophen (TYLENOL) 500 MG tablet Take 1,000 mg by mouth See admin instructions. Take 1,000 mg by mouth in the morning and at bedtime and an additional 1,000 mg once a day as needed for pain    [provider]  apixaban (ELIQUIS) 5 MG TABS tablet Take 1 tablet (5 mg total) by mouth 2 (two) times daily. 10/16/22  11/15/22  Jenel Lucks, MD  ARIPiprazole (ABILIFY) 5 MG tablet Take 1 tablet (5 mg total) by mouth daily. Patient not taking: Reported on 07/23/2022 07/10/22 09/08/22  Theodoro Kos A  benzonatate (TESSALON) 100 MG capsule Take 1 capsule (100 mg total) by mouth every 8 (eight) hours. 01/20/23   Arletha Pili, DO  bumetanide (BUMEX) 1 MG tablet Take 1 tablet (1 mg total) by mouth daily. Patient taking differently: Take 1 mg by mouth daily as needed (swelling). 03/11/22   Angiulli, Mcarthur Rossetti, PA-C  Dupilumab (DUPIXENT) 300 MG/2ML SOPN Inject 300 mg into the skin every 14 (fourteen) days.    [provider]  fluticasone furoate-vilanterol (BREO ELLIPTA) 200-25 MCG/ACT AEPB Inhale 1 puff into the lungs daily. 03/11/22   Angiulli, Mcarthur Rossetti, PA-C  glipiZIDE (GLUCOTROL) 10 MG tablet Take 10 mg by mouth 2 (two) times daily before a  meal. 12/02/21 10/14/22  [provider]  levalbuterol Pauline Aus) 1.25 MG/0.5ML nebulizer solution Take 1.25 mg by nebulization every 8 (eight) hours as needed for wheezing or shortness of breath. 03/28/22   Amin, Ankit C, MD  Melatonin 10 MG TABS Take 10 mg by mouth at bedtime.    [provider]  Multiple Vitamins-Minerals (BARIATRIC MULTIVITAMINS/IRON PO) Take 1 tablet by mouth daily with breakfast.    [provider]  nystatin (MYCOSTATIN/NYSTOP) powder Apply topically 2 (two) times daily. Patient taking differently: Apply 1 Application topically daily as needed (skin irritation). 02/01/20   Armandina Stammer I, NP  OPCON-A 0.027-0.315 % SOLN Place 1 drop into both eyes 2 (two) times daily as needed (for allergies or irritation).    [provider]  pantoprazole (PROTONIX) 40 MG tablet Take 1 tablet (40 mg total) by mouth 2 (two) times daily. 03/11/22   Angiulli, Mcarthur Rossetti, PA-C  polyethylene glycol powder (GLYCOLAX/MIRALAX) 17 GM/SCOOP powder Take 17 g by mouth daily as needed for mild constipation. 01/10/22   [provider]   potassium chloride SA (KLOR-CON M) 20 MEQ tablet Take 1 tablet (20 mEq total) by mouth daily for 15 days. 03/28/22 07/23/22  Amin, Ankit C, MD  pregabalin (LYRICA) 100 MG capsule Take 1 capsule (100 mg total) by mouth 2 (two) times daily. 03/11/22   Angiulli, Mcarthur Rossetti, PA-C  propranolol (INDERAL) 60 MG tablet Take 1 tablet (60 mg total) by mouth 3 (three) times daily. 03/11/22   Angiulli, Mcarthur Rossetti, PA-C  scopolamine (TRANSDERM-SCOP) 1 MG/3DAYS Place 1 patch (1.5 mg total) onto the skin every 3 (three) days. Patient taking differently: Place 1 patch onto the skin every three (3) days as needed (for nausea). 03/12/22   Angiulli, Mcarthur Rossetti, PA-C  Semaglutide,0.25 or 0.5MG /DOS, (OZEMPIC, 0.25 OR 0.5 MG/DOSE,) 2 MG/3ML SOPN Inject 0.5 mg into the skin every Wednesday. Patient not taking: Reported on 07/23/2022    [provider]  spironolactone (ALDACTONE) 25 MG tablet Take 1 tablet (25 mg total) by mouth daily. Patient taking differently: Take 25 mg by mouth in the morning. 03/11/22   Angiulli, Mcarthur Rossetti, PA-C  Vitamin D, Ergocalciferol, (DRISDOL) 1.25 MG (50000 UNIT) CAPS capsule Take 1 capsule (50,000 Units total) by mouth once a week. Monday Patient taking differently: Take 50,000 Units by mouth every Monday. 03/11/22   Angiulli, Mcarthur Rossetti, PA-C  VOLTAREN ARTHRITIS PAIN 1 % GEL Apply 2 g topically See admin instructions. Apply 2 grams to affected areas in the morning and at bedtime 01/20/22   [provider]  XOPENEX HFA 45 MCG/ACT inhaler Inhale 2 puffs into the lungs See admin instructions. Inhale 2 puffs into the lungs every 4-6 hours as needed for wheezing or shortness of breath    [provider]  ZYRTEC ALLERGY 10 MG tablet Take 10 mg by mouth daily as needed (for seasonal allergies).    [provider]    Physical Exam: Vitals:   04/23/23 2000 04/23/23 2026 04/23/23 2120 04/23/23 2230  BP: (!) 104/36 (!) 93/35 (!) 98/43 (!) 83/36  Pulse: 96 99 99 100  Resp: 14 19 (!)  21 17  Temp: 98.5 F (36.9 C) 99.8 F (37.7 C)    TempSrc: Oral Oral    SpO2: 100% 100% 98% 98%  Weight:      Height:       *** Constitutional: NAD, calm, comfortable Eyes: PERRL, lids and conjunctivae normal ENMT: Mucous membranes are moist. Posterior pharynx clear of any  exudate or lesions.Normal dentition.  Neck: normal, supple, no masses. Respiratory: clear to auscultation bilaterally, no wheezing, no crackles. Normal respiratory effort. No accessory muscle use.  Cardiovascular: Regular rate and rhythm, no murmurs / rubs / gallops. No extremity edema. 2+ pedal pulses. Abdomen: no tenderness, no masses palpated. No hepatosplenomegaly. Bowel sounds positive.  Musculoskeletal: no clubbing / cyanosis. No joint deformity upper and lower extremities. Good ROM, no contractures. Normal muscle tone.  Skin: no rashes, lesions, ulcers. No induration Neurologic: CN 2-12 grossly intact. Sensation intact. Strength 5/5 in all 4.  Psychiatric: Normal judgment and insight. Alert and oriented x 3. Normal mood.   EKG: Personally reviewed. Sinus rhythm, rate 86, no acute ischemic changes.  Similar to prior.  Assessment/Plan Active Problems:   * No active hospital problems. *   *** No notes on file *** Assessment and Plan: No notes have been filed under this hospital service. Service: Hospitalist  Acute on chronic HFpEF: ***  Paroxysmal atrial fibrillation: ***  Hypertension: ***  Asthma: ***  Type 2 diabetes: ***  Depression/anxiety: ***    DVT prophylaxis: ***  Code Status: ***  Family Communication: ***  Disposition Plan: ***  Consults called: ***  Severity of Illness: {Observation/Inpatient:21159}  Darreld Mclean MD [facility]  If 7PM-7AM, please contact night-coverage [email or web address]  04/23/2023, 11:38 PM

## 2023-04-24 ENCOUNTER — Encounter (HOSPITAL_COMMUNITY): Payer: Self-pay | Admitting: Internal Medicine

## 2023-04-24 ENCOUNTER — Inpatient Hospital Stay (HOSPITAL_COMMUNITY): Payer: Medicaid Other

## 2023-04-24 DIAGNOSIS — I480 Paroxysmal atrial fibrillation: Secondary | ICD-10-CM | POA: Diagnosis not present

## 2023-04-24 DIAGNOSIS — F418 Other specified anxiety disorders: Secondary | ICD-10-CM | POA: Diagnosis not present

## 2023-04-24 DIAGNOSIS — E119 Type 2 diabetes mellitus without complications: Secondary | ICD-10-CM

## 2023-04-24 DIAGNOSIS — I10 Essential (primary) hypertension: Secondary | ICD-10-CM

## 2023-04-24 DIAGNOSIS — I5033 Acute on chronic diastolic (congestive) heart failure: Secondary | ICD-10-CM

## 2023-04-24 DIAGNOSIS — J45909 Unspecified asthma, uncomplicated: Secondary | ICD-10-CM

## 2023-04-24 LAB — CBC
HCT: 29.9 % — ABNORMAL LOW (ref 36.0–46.0)
Hemoglobin: 8.8 g/dL — ABNORMAL LOW (ref 12.0–15.0)
MCH: 25.7 pg — ABNORMAL LOW (ref 26.0–34.0)
MCHC: 29.4 g/dL — ABNORMAL LOW (ref 30.0–36.0)
MCV: 87.2 fL (ref 80.0–100.0)
Platelets: 205 10*3/uL (ref 150–400)
RBC: 3.43 MIL/uL — ABNORMAL LOW (ref 3.87–5.11)
RDW: 17.2 % — ABNORMAL HIGH (ref 11.5–15.5)
WBC: 7.6 10*3/uL (ref 4.0–10.5)
nRBC: 0 % (ref 0.0–0.2)

## 2023-04-24 LAB — ECHOCARDIOGRAM COMPLETE
AR max vel: 2.05 cm2
AV Area VTI: 2.19 cm2
AV Area mean vel: 2.12 cm2
AV Mean grad: 15 mm[Hg]
AV Peak grad: 27.4 mm[Hg]
Ao pk vel: 2.62 m/s
Area-P 1/2: 4.6 cm2
Height: 65 in
S' Lateral: 3.4 cm
Weight: 7086.47 [oz_av]

## 2023-04-24 LAB — HIV ANTIBODY (ROUTINE TESTING W REFLEX): HIV Screen 4th Generation wRfx: NONREACTIVE

## 2023-04-24 LAB — CBG MONITORING, ED
Glucose-Capillary: 121 mg/dL — ABNORMAL HIGH (ref 70–99)
Glucose-Capillary: 148 mg/dL — ABNORMAL HIGH (ref 70–99)
Glucose-Capillary: 148 mg/dL — ABNORMAL HIGH (ref 70–99)

## 2023-04-24 LAB — BASIC METABOLIC PANEL
Anion gap: 12 (ref 5–15)
BUN: 5 mg/dL — ABNORMAL LOW (ref 6–20)
CO2: 27 mmol/L (ref 22–32)
Calcium: 8.3 mg/dL — ABNORMAL LOW (ref 8.9–10.3)
Chloride: 100 mmol/L (ref 98–111)
Creatinine, Ser: 0.81 mg/dL (ref 0.44–1.00)
GFR, Estimated: >60 mL/min (ref >60)
Glucose, Bld: 141 mg/dL — ABNORMAL HIGH (ref 70–99)
Potassium: 3.5 mmol/L (ref 3.5–5.1)
Sodium: 139 mmol/L (ref 135–145)

## 2023-04-24 LAB — GLUCOSE, CAPILLARY
Glucose-Capillary: 168 mg/dL — ABNORMAL HIGH (ref 70–99)
Glucose-Capillary: 177 mg/dL — ABNORMAL HIGH (ref 70–99)

## 2023-04-24 MED ORDER — POTASSIUM CHLORIDE CRYS ER 20 MEQ PO TBCR
40.0000 meq | EXTENDED_RELEASE_TABLET | Freq: Every day | ORAL | Status: DC
  Administered 2023-04-24: 40 meq via ORAL
  Filled 2023-04-24: qty 2

## 2023-04-24 MED ORDER — BISACODYL 5 MG PO TBEC: 5.0000 mg | DELAYED_RELEASE_TABLET | Freq: Every day | ORAL | Status: DC | PRN

## 2023-04-24 MED ORDER — PREGABALIN 75 MG PO CAPS
200.0000 mg | ORAL_CAPSULE | Freq: Two times a day (BID) | ORAL | Status: DC
  Administered 2023-04-24 – 2023-04-30 (×13): 200 mg via ORAL
  Filled 2023-04-24 (×3): qty 1
  Filled 2023-04-24: qty 2
  Filled 2023-04-24 (×9): qty 1

## 2023-04-24 MED ORDER — PERFLUTREN LIPID MICROSPHERE
1.0000 mL | INTRAVENOUS | Status: AC | PRN
  Administered 2023-04-24: 4 mL via INTRAVENOUS

## 2023-04-24 MED ORDER — APIXABAN 5 MG PO TABS
5.0000 mg | ORAL_TABLET | Freq: Two times a day (BID) | ORAL | Status: DC
  Administered 2023-04-24 – 2023-04-30 (×13): 5 mg via ORAL
  Filled 2023-04-24 (×13): qty 1

## 2023-04-24 MED ORDER — INSULIN ASPART 100 UNIT/ML IJ SOLN
0.0000 [IU] | Freq: Three times a day (TID) | INTRAMUSCULAR | Status: DC
  Administered 2023-04-24: 2 [IU] via SUBCUTANEOUS
  Administered 2023-04-24: 1 [IU] via SUBCUTANEOUS
  Administered 2023-04-24: 2 [IU] via SUBCUTANEOUS
  Administered 2023-04-25: 1 [IU] via SUBCUTANEOUS
  Administered 2023-04-25: 2 [IU] via SUBCUTANEOUS
  Administered 2023-04-25 – 2023-04-26 (×3): 1 [IU] via SUBCUTANEOUS
  Administered 2023-04-26: 2 [IU] via SUBCUTANEOUS
  Administered 2023-04-27: 1 [IU] via SUBCUTANEOUS
  Administered 2023-04-27: 2 [IU] via SUBCUTANEOUS
  Administered 2023-04-27 – 2023-04-28 (×2): 1 [IU] via SUBCUTANEOUS
  Administered 2023-04-28 (×2): 2 [IU] via SUBCUTANEOUS
  Administered 2023-04-29: 1 [IU] via SUBCUTANEOUS
  Administered 2023-04-29: 2 [IU] via SUBCUTANEOUS
  Administered 2023-04-29: 1 [IU] via SUBCUTANEOUS
  Administered 2023-04-30: 2 [IU] via SUBCUTANEOUS
  Administered 2023-04-30: 1 [IU] via SUBCUTANEOUS

## 2023-04-24 MED ORDER — POLYSACCHARIDE IRON COMPLEX 150 MG PO CAPS
150.0000 mg | ORAL_CAPSULE | Freq: Every day | ORAL | Status: DC
  Administered 2023-04-25: 150 mg via ORAL
  Filled 2023-04-24 (×6): qty 1

## 2023-04-24 MED ORDER — INSULIN ASPART 100 UNIT/ML IJ SOLN
0.0000 [IU] | Freq: Every day | INTRAMUSCULAR | Status: DC
  Administered 2023-04-29: 2 [IU] via SUBCUTANEOUS

## 2023-04-24 MED ORDER — LIFITEGRAST 5 % OP SOLN: 1.0000 [drp] | Freq: Two times a day (BID) | OPHTHALMIC | Status: DC

## 2023-04-24 MED ORDER — ORAL CARE MOUTH RINSE: 15.0000 mL | OROMUCOSAL | Status: DC | PRN

## 2023-04-24 MED ORDER — DICLOFENAC SODIUM 1 % EX GEL
2.0000 g | Freq: Two times a day (BID) | CUTANEOUS | Status: DC
  Administered 2023-04-24 – 2023-04-30 (×11): 2 g via TOPICAL
  Filled 2023-04-24: qty 100

## 2023-04-24 MED ORDER — HYDROCODONE-ACETAMINOPHEN 10-325 MG PO TABS
1.0000 | ORAL_TABLET | Freq: Four times a day (QID) | ORAL | Status: DC | PRN
  Administered 2023-04-24 – 2023-04-26 (×4): 1 via ORAL
  Administered 2023-04-27: 2 via ORAL
  Administered 2023-04-28 – 2023-04-29 (×2): 1 via ORAL
  Administered 2023-04-29: 2 via ORAL
  Administered 2023-04-30: 1 via ORAL
  Filled 2023-04-24 (×2): qty 1
  Filled 2023-04-24 (×2): qty 2
  Filled 2023-04-24 (×4): qty 1
  Filled 2023-04-24: qty 2

## 2023-04-24 MED ORDER — MAGNESIUM OXIDE -MG SUPPLEMENT 400 (240 MG) MG PO TABS
400.0000 mg | ORAL_TABLET | Freq: Every day | ORAL | Status: DC
  Administered 2023-04-25 – 2023-04-30 (×6): 400 mg via ORAL
  Filled 2023-04-24 (×6): qty 1

## 2023-04-24 MED ORDER — PROCHLORPERAZINE EDISYLATE 10 MG/2ML IJ SOLN
10.0000 mg | Freq: Four times a day (QID) | INTRAMUSCULAR | Status: DC | PRN
  Administered 2023-04-24 – 2023-04-26 (×3): 10 mg via INTRAVENOUS
  Filled 2023-04-24 (×4): qty 2

## 2023-04-24 MED ORDER — MELATONIN 5 MG PO TABS
10.0000 mg | ORAL_TABLET | Freq: Every day | ORAL | Status: DC
  Administered 2023-04-25 – 2023-04-29 (×5): 10 mg via ORAL
  Filled 2023-04-24 (×5): qty 2

## 2023-04-24 MED ORDER — POTASSIUM CHLORIDE 20 MEQ PO PACK
60.0000 meq | PACK | Freq: Once | ORAL | Status: AC
  Administered 2023-04-24: 60 meq via ORAL
  Filled 2023-04-24: qty 3

## 2023-04-24 MED ORDER — PANTOPRAZOLE SODIUM 40 MG PO TBEC
40.0000 mg | DELAYED_RELEASE_TABLET | Freq: Two times a day (BID) | ORAL | Status: DC
  Administered 2023-04-24 – 2023-04-25 (×4): 40 mg via ORAL
  Filled 2023-04-24 (×4): qty 1

## 2023-04-24 MED ORDER — ACETAMINOPHEN 650 MG RE SUPP: 650.0000 mg | Freq: Four times a day (QID) | RECTAL | Status: DC | PRN

## 2023-04-24 MED ORDER — MOMETASONE FURO-FORMOTEROL FUM 200-5 MCG/ACT IN AERO
1.0000 | INHALATION_SPRAY | Freq: Every day | RESPIRATORY_TRACT | Status: DC
  Administered 2023-04-25 – 2023-04-30 (×6): 1 via RESPIRATORY_TRACT
  Filled 2023-04-24: qty 8.8

## 2023-04-24 MED ORDER — SENNOSIDES-DOCUSATE SODIUM 8.6-50 MG PO TABS: 1.0000 | ORAL_TABLET | Freq: Every evening | ORAL | Status: DC | PRN

## 2023-04-24 MED ORDER — SPIRONOLACTONE 25 MG PO TABS
25.0000 mg | ORAL_TABLET | Freq: Every day | ORAL | Status: DC
  Administered 2023-04-24 – 2023-04-30 (×7): 25 mg via ORAL
  Filled 2023-04-24 (×7): qty 1

## 2023-04-24 MED ORDER — FUROSEMIDE 10 MG/ML IJ SOLN
40.0000 mg | Freq: Two times a day (BID) | INTRAMUSCULAR | Status: DC
  Administered 2023-04-24: 40 mg via INTRAVENOUS
  Filled 2023-04-24 (×2): qty 4

## 2023-04-24 MED ORDER — ACETAMINOPHEN 325 MG PO TABS
650.0000 mg | ORAL_TABLET | Freq: Four times a day (QID) | ORAL | Status: DC | PRN
  Administered 2023-04-25 – 2023-04-29 (×3): 650 mg via ORAL
  Filled 2023-04-24 (×4): qty 2

## 2023-04-24 MED ORDER — LEVALBUTEROL HCL 0.63 MG/3ML IN NEBU: 0.6300 mg | INHALATION_SOLUTION | Freq: Four times a day (QID) | RESPIRATORY_TRACT | Status: DC | PRN

## 2023-04-24 MED ORDER — SODIUM CHLORIDE 0.9% FLUSH
3.0000 mL | Freq: Two times a day (BID) | INTRAVENOUS | Status: DC
  Administered 2023-04-24 – 2023-04-30 (×13): 3 mL via INTRAVENOUS

## 2023-04-24 MED ORDER — MORPHINE SULFATE (PF) 2 MG/ML IV SOLN
2.0000 mg | INTRAVENOUS | Status: DC | PRN
  Administered 2023-04-24: 2 mg via INTRAVENOUS
  Filled 2023-04-24: qty 1

## 2023-04-24 MED ORDER — FUROSEMIDE 10 MG/ML IJ SOLN
60.0000 mg | Freq: Two times a day (BID) | INTRAMUSCULAR | Status: DC
  Administered 2023-04-24 – 2023-04-26 (×4): 60 mg via INTRAVENOUS
  Filled 2023-04-24 (×4): qty 6

## 2023-04-24 NOTE — Progress Notes (Signed)
Heart Failure Navigator Progress Note  Assessed for Heart & Vascular TOC clinic readiness.  Patient does not meet criteria due to - referred to AHF with [facility], EF 60-65%.   Navigator will sign off at this time.   Rhae Hammock, BSN, Scientist, clinical (histocompatibility and immunogenetics) Only

## 2023-04-24 NOTE — Plan of Care (Signed)
Patient give IV lasix.  She has significant pitting edam overall body.  Patient fatigued and weak.  Vitals remaining stable.

## 2023-04-24 NOTE — Hospital Course (Addendum)
 Alyssa Blanchard was admitted to the hospital with the working diagnosis of heart failure exacerbation complicated with atrial fibrillation with rapid ventricular response and norovirus gastroenteritis.    53 y.o. female with medical history significant for heart failure, paroxysmal atrial fibrillation, asthma, T2DM, HTN, depression/anxiety, and obesity class 3 who p[resented with dyspnea and edema.  Recent hospitalization at [facility] 01/01 to 03/17/23 for heart failure exacerbation, she was diuresed 17 L with good toleration.  One week after her discharge she noted worsening dyspnea and lower extremity edema, despite taking her medications as prescribed. Because of worsening symptoms she came to the hospital. On her initial physical examination her blood pressure was 116/41, HR 89, RR 20 and 02 saturation 98%, lungs with no wheezing or rhonchi, heart with S1 and S2 present and regular with no gallops, or rubs, abdomen protuberant but non tender, positive lower extremity edema.   Na 137, K 3,1 Cl 98 bicarbonate 28 glucose 128 bun 6 cr 0,84  High sensitive troponin 7 and 7  Wbc 10,9 hgb 9,3 plt 215 Sars covid 19 negative  Influenza negative   Chest radiograph with hypoinflation, right rotation, bilateral hilar vascular congestion with fluid in the right fissure. No infiltrates and no effusions.   EKG 89 bpm, normal axis, normal intervals, qtc 490, sinus rhythm with no significant ST segment or T wave changes.   Patient was placed on IV furosemide for diuresis. She developed atrial fibrillation with RVR, cardiology was consulted and she underwent on 02/18 direct current cardioversion with conversion to sinus rhythm.  During her hospitalization she was treated for urinary tract infection, right leg cellulitis and norovirus gastroenteritis.   Patient had improvement in her volume status, she was evaluated by PT/ OT and recommendations to continue therapy in inpatient rehab.

## 2023-04-24 NOTE — ED Notes (Signed)
Assisted pt with bedpan; pt cleaned, chux changed

## 2023-04-24 NOTE — Progress Notes (Signed)
PROGRESS NOTE    Alyssa Blanchard  ZOX:[id number] DOB: June 17, 1970 DOA: 04/23/2023 PCP: Lenox Ponds, MD    Chief Complaint  Patient presents with   Shortness of Breath    Brief Narrative:  Patient is a 53 year old, morbidly obese female with history of chronic HFpEF, PAF on Eliquis, asthma, type 2 diabetes, hypertension, depression/anxiety presented to the ED and admitted for acute on chronic HFpEF.  Cardiology consulted   Assessment & Plan:   Principal Problem:   Acute on chronic heart failure with preserved ejection fraction (HFpEF) (HCC) Active Problems:   Paroxysmal atrial fibrillation (HCC)   Type 2 diabetes mellitus without complication, without long-term current use of insulin (HCC)   Asthma   Depression with anxiety  #1 acute on chronic HFpEF -Patient presented with progressive dyspnea on exertion, weight gain, peripheral edema. -BNP noted at 66.4. -Patient recently admitted at [facility] weight on discharge at the time was 179 kg on discharge weight on presentation noted at 200.9 kg. -Patient endorses dietary compliance as well as medication compliance. -Patient noted in the ED to have some low blood pressure readings however concern for accuracy likely limited due to body habitus. -Recent 2D echo at outside hospital on 03/10/2023 with a EF of 60 to 65%, moderate pulmonary hypertension. -Patient currently on Lasix 40 mg IV every 12 hours with urine output of 900 cc recorded since admission. -Current weight of 199.3 kg from 200.9 kg on admission. -Patient started on midodrine for BP support. -Continue to hold spironolactone. -Strict I's and O's, daily weights. -Consulted cardiology for further evaluation and management.  2.  Paroxysmal A-fib -Currently normal sinus rhythm. -Rate currently controlled. -Noted to be on propranolol 80 mg 3 times daily which is currently on hold. -Eliquis for anticoagulation.  3.  Hypertension -On admission patient noted to  have intermittently low BPs however concern for accuracy given body habitus. -Antihypertensive medications on hold while patient on IV diuretics. -Patient was started on midodrine for BP support.  4.  Asthma -Continue Breo Ellipta and Xopenex inhaler as needed.  5.  Well-controlled type 2 diabetes mellitus -Hemoglobin A1c 6.7% on 03/09/2023. -SSI.  6.  Depression/anxiety -Stable. -Not on any medications per med rec. -Outpatient follow-up.  7.  Morbid obesity -BMI 73.7 kg/m -Patient may have underlying OSA/OHS. -Per admitting physician patient stated scheduled for sleep study in April. -Lifestyle modification. -Outpatient follow-up with PCP.   DVT prophylaxis: Eliquis Code Status: Full Family Communication: Updated patient.  No family at bedside. Disposition: TBD  Status is: Inpatient Remains inpatient appropriate because: Severity of illness   Consultants:  Cardiology pending  Procedures:  Chest x-ray 04/23/2023 2D echo 04/24/2023 pending  Antimicrobials:  Anti-infectives (From admission, onward)    None         Subjective: Laying in bed in the ED.  States no significant change with shortness of breath or lower extremity edema.  Denies any chest pain.  States was just that [facility] with CHF exacerbation and since discharge has been having increased swelling and worsening shortness of breath which has not improved despite increasing home regimen Bumex as directed by primary cardiologist.  Objective: Vitals:   04/24/23 0530 04/24/23 0600 04/24/23 0822 04/24/23 0930  BP: (!) 97/50 (!) 98/43  (!) 112/53  Pulse: 88 88  88  Resp: 15 13  14   Temp:   98.9 F (37.2 C)   TempSrc:      SpO2: 90% 93%  100%  Weight:  Height:        Intake/Output Summary (Last 24 hours) at 04/24/2023 1017 Last data filed at 04/23/2023 2000 Gross per 24 hour  Intake --  Output 900 ml  Net -900 ml   Filed Weights   04/23/23 1616  Weight: (!) 200.9 kg     Examination:  General exam: Appears calm and comfortable  Respiratory system: Distant breath sounds.  Some bibasilar crackles noted.  No wheezing.  Fair air movement.  Speaking in full sentences.   Cardiovascular system: Distant heart sounds due to body habitus.  RRR.  Unable to assess JVD due to neck thickness.  1-2+ bilateral lower extremity edema.  Gastrointestinal system: Abdomen is obese, nondistended, soft and nontender. No organomegaly or masses felt. Normal bowel sounds heard. Central nervous system: Alert and oriented. No focal neurological deficits. Extremities: Symmetric 5 x 5 power. Skin: No rashes, lesions or ulcers Psychiatry: Judgement and insight appear normal. Mood & affect appropriate.     Data Reviewed: I have personally reviewed following labs and imaging studies  CBC: Recent Labs  Lab 04/23/23 2000 04/24/23 0624  WBC 10.9* 7.6  HGB 9.3* 8.8*  HCT 30.6* 29.9*  MCV 85.0 87.2  PLT 215 205    Basic Metabolic Panel: Recent Labs  Lab 04/23/23 2000 04/24/23 0624  NA 137 139  K 3.1* 3.5  CL 98 100  CO2 28 27  GLUCOSE 128* 141*  BUN 6 5*  CREATININE 0.84 0.81  CALCIUM 8.4* 8.3*    GFR: Estimated Creatinine Clearance: 147 mL/min (by C-G formula based on SCr of 0.81 mg/dL).  Liver Function Tests: Recent Labs  Lab 04/23/23 2000  AST 44*  ALT 28  ALKPHOS 88  BILITOT 1.1  PROT 6.4*  ALBUMIN 2.9*    CBG: Recent Labs  Lab 04/24/23 0144 04/24/23 0818  GLUCAP 121* 148*     Recent Results (from the past 240 hours)  Resp panel by RT-PCR (RSV, Flu A&B, Covid) Anterior Nasal Swab     Status: None   Collection Time: 04/23/23  5:57 PM   Specimen: Anterior Nasal Swab  Result Value Ref Range Status   SARS Coronavirus 2 by RT PCR NEGATIVE NEGATIVE Final   Influenza A by PCR NEGATIVE NEGATIVE Final   Influenza B by PCR NEGATIVE NEGATIVE Final    Comment: (NOTE) The Xpert Xpress SARS-CoV-2/FLU/RSV plus assay is intended as an aid in the  diagnosis of influenza from Nasopharyngeal swab specimens and should not be used as a sole basis for treatment. Nasal washings and aspirates are unacceptable for Xpert Xpress SARS-CoV-2/FLU/RSV testing.  Fact Sheet for Patients: BloggerCourse.com  Fact Sheet for Healthcare Providers: SeriousBroker.it  This test is not yet approved or cleared by the [place] FDA and has been authorized for detection and/or diagnosis of SARS-CoV-2 by FDA under an Emergency Use Authorization (EUA). This EUA will remain in effect (meaning this test can be used) for the duration of the COVID-19 declaration under Section 564(b)(1) of the Act, 21 U.S.C. section 360bbb-3(b)(1), unless the authorization is terminated or revoked.     Resp Syncytial Virus by PCR NEGATIVE NEGATIVE Final    Comment: (NOTE) Fact Sheet for Patients: BloggerCourse.com  Fact Sheet for Healthcare Providers: SeriousBroker.it  This test is not yet approved or cleared by the [place] FDA and has been authorized for detection and/or diagnosis of SARS-CoV-2 by FDA under an Emergency Use Authorization (EUA). This EUA will remain in effect (meaning this test can be used) for the duration  of the COVID-19 declaration under Section 564(b)(1) of the Act, 21 U.S.C. section 360bbb-3(b)(1), unless the authorization is terminated or revoked.  Performed at [facility], [address], [place], [place] [address]          Radiology Studies: DG Chest Port 1 View Result Date: 04/23/2023 CLINICAL DATA:  Shortness of breath, fluid overload EXAM: PORTABLE CHEST 1 VIEW COMPARISON:  03/10/2023 FINDINGS: Mild enlargement of the cardiopericardial silhouette. Airway thickening is present, suggesting bronchitis or reactive airways disease. Stable bandlike densities in both mid lungs favoring scarring. No blunting of the costophrenic  angles. Indistinct pulmonary vasculature, cannot exclude pulmonary venous hypertension. No overt edema. Lower thoracic spondylosis. IMPRESSION: 1. Mild enlargement of the cardiopericardial silhouette with indistinct pulmonary vasculature, cannot exclude pulmonary venous hypertension. No overt edema. 2. Airway thickening is present, suggesting bronchitis or reactive airways disease. 3. Stable bandlike densities in both mid lungs favoring scarring. Electronically Signed   By: Gaylyn Rong M.D.   On: 04/23/2023 17:54        Scheduled Meds:  apixaban  5 mg Oral BID   diclofenac Sodium  2 g Topical See admin instructions   fluticasone furoate-vilanterol  1 puff Inhalation Daily   furosemide  40 mg Intravenous Q12H   insulin aspart  0-5 Units Subcutaneous QHS   insulin aspart  0-9 Units Subcutaneous TID WC   iron polysaccharides  150 mg Oral Daily   Lifitegrast  1 drop Both Eyes BID   magnesium oxide  400 mg Oral Daily   melatonin  10 mg Oral QHS   midodrine  5 mg Oral TID WC   pantoprazole  40 mg Oral BID   potassium chloride SA  40 mEq Oral Daily   pregabalin  200 mg Oral BID   sodium chloride flush  3 mL Intravenous Q12H   Continuous Infusions:   LOS: 1 day    Time spent: 40 minutes    Ramiro Harvest, MD [facility]   To contact the attending provider between 7A-7P or the covering provider during after hours 7P-7A, please log into the web site [email or web address] and access using universal [place] password for that web site. If you do not have the password, please call the hospital operator.  04/24/2023, 10:17 AM

## 2023-04-24 NOTE — ED Notes (Signed)
Echo at the bedside

## 2023-04-24 NOTE — Consult Note (Addendum)
 Cardiology Consultation   Patient ID: Alyssa Blanchard MRN: [id number]; DOB: 06-Jun-1970  Admit date: 04/23/2023 Date of Consult: 04/24/2023  PCP:  Lenox Ponds, MD   [facility] Providers Cardiologist:  Chilton Si, MD [facility]   Patient Profile:   Alyssa Blanchard is a 53 y.o. female with a hx of chronic HFpEF, paroxysmal fibrillation, likely on CPAP, type II hypertension, BMI 73,S/p laparoscopic sleeve gastrectomy 2016,  asthma, who is being seen 04/24/2023 for the evaluation of HFpEF exacerbation at the request of Dr. Alinda Money.  History of Present Illness:   Alyssa Blanchard was seen by our group in 2021 where she had successful TEE/DCCV and has been maintaining NSR since that time.  Appears now that she is established with [facility].  At 1 point was on dofetilide in October 2022, reportedly had missed some doses and did not want to be back on it.Marland Kitchen  She was declined for AF ablation given weight.  There was some comments about TEE estimating EF to be 30 to 35% thought possibly due stunned myocardium however subsequent EKGs have not shown reduced EF.  She has had previous right and left heart catheterization in September 2023 which noted severely elevated intra-cardiac filling pressures; angiographically normal coronaries wedge pressure of 34.  RVSP 62.  PA 61/39, preserved cardiac output, PVR 102, 1.3 Wood units.  Additionally has struggled with weight throughout the years.  At her heaviest had a BMI of 80 2022/2023.  She is status post laparoscopic sleeve gastrectomy in 2016.  At 1 point was on Ozempic for weight loss unsure if she is on this now.  More recently it seems [facility] for HFpEF exacerbation.  Per discharge summary weight on this was 206 kg down 179 kg on discharge.  She is discharged on Bumex 2 mg daily, spironolactone 25 mg.    Today she returns for HFpEF exacerbation and significant weight gain.  She reported being  around 385 pounds back at home and today on admission if correct is 442 pounds.  She reported that since her discharge she has had aggressive weight gain and states that she responds to the Bumex decently however rapidly reaccumulate's of fluid and has short bursts of sufficient output.  She is compliant with her medications.  Has a 2 L fluid restriction low-sodium diet.  She is extremely uncomfortable now laying flat in a bariatric bed with some mild tachypnea.  Started on IV Lasix 40 mg twice daily with unknown output.  Still feels massively volume of and uncomfortable.  Both her legs are very warm to the touch.  She reports ambulating minimally due to size but states that she can walk short distances to the bathroom without mechanical assistance.  Denies any chest pain.  Primary complaint is shortness of breath with minimal movement/activity.  Chest x-ray with potential findings of pulmonary venous hypertension, no overt edema.  Negative respiratory panel.  Potassium 3.5.  Normal renal function.  BNP 66.4.  Troponins negative x 2.     Past Medical History:  Diagnosis Date   Anxiety    Asthma    Atrial fibrillation (HCC)    Depression    DM (diabetes mellitus), type 2 (HCC) 02/16/2022   Dysrhythmia    new onset Afib rvr   GERD (gastroesophageal reflux disease)    Heart failure (HCC)    Heel spur    bilat   History of bronchitis    History of chicken  pox    History of urinary tract infection    Hypertension    Migraines    Plantar fasciitis    bilat   STD (sexually transmitted disease)    chl hx & hsv 1&2   Tremors of nervous system    Urinary incontinence     Past Surgical History:  Procedure Laterality Date   BIOPSY  10/14/2022   Procedure: BIOPSY;  Surgeon: Jenel Lucks, MD;  Location: [facility];  Service: Gastroenterology;;   CARDIOVERSION N/A 08/05/2019   Procedure: CARDIOVERSION;  Surgeon: Vesta Mixer, MD;  Location: [facility] ENDOSCOPY;  Service: Cardiovascular;   Laterality: N/A;   COLONOSCOPY WITH PROPOFOL N/A 10/14/2022   Procedure: COLONOSCOPY WITH PROPOFOL;  Surgeon: Jenel Lucks, MD;  Location: [facility] ENDOSCOPY;  Service: Gastroenterology;  Laterality: N/A;   ESOPHAGOGASTRODUODENOSCOPY (EGD) WITH PROPOFOL N/A 10/14/2022   Procedure: ESOPHAGOGASTRODUODENOSCOPY (EGD) WITH PROPOFOL;  Surgeon: Jenel Lucks, MD;  Location: [facility] ENDOSCOPY;  Service: Gastroenterology;  Laterality: N/A;   LAPAROSCOPIC GASTRIC SLEEVE RESECTION N/A 11/27/2014   Procedure: LAPAROSCOPIC GASTRIC SLEEVE RESECTION WITH HIATAL HERNIA REPAIR UPPER ENDOSCOPY;  Surgeon: Luretha Murphy, MD;  Location: [facility] ORS;  Service: General;  Laterality: N/A;   POLYPECTOMY  10/14/2022   Procedure: POLYPECTOMY;  Surgeon: Jenel Lucks, MD;  Location: [facility] ENDOSCOPY;  Service: Gastroenterology;;   TEE WITHOUT CARDIOVERSION N/A 08/05/2019   Procedure: TRANSESOPHAGEAL ECHOCARDIOGRAM (TEE);  Surgeon: Vesta Mixer, MD;  Location: [facility] ENDOSCOPY;  Service: Cardiovascular;  Laterality: N/A;   TONSILLECTOMY  1978     Inpatient Medications: Scheduled Meds:  apixaban  5 mg Oral BID   diclofenac Sodium  2 g Topical See admin instructions   fluticasone furoate-vilanterol  1 puff Inhalation Daily   furosemide  60 mg Intravenous BID   insulin aspart  0-5 Units Subcutaneous QHS   insulin aspart  0-9 Units Subcutaneous TID WC   iron polysaccharides  150 mg Oral Daily   Lifitegrast  1 drop Both Eyes BID   magnesium oxide  400 mg Oral Daily   melatonin  10 mg Oral QHS   midodrine  5 mg Oral TID WC   pantoprazole  40 mg Oral BID   potassium chloride SA  40 mEq Oral Daily   pregabalin  200 mg Oral BID   sodium chloride flush  3 mL Intravenous Q12H   spironolactone  25 mg Oral Daily   Continuous Infusions:  PRN Meds: acetaminophen **OR** acetaminophen, bisacodyl, HYDROcodone-acetaminophen, levalbuterol, morphine injection, prochlorperazine, senna-docusate  Allergies:    Allergies  Allergen  Reactions   Gabapentin Anaphylaxis, Nausea And Vomiting and Other (See Comments)    "Cannot walk"   Topiramate Other (See Comments)    Jaw tightness and chest discomfort    Albuterol Other (See Comments)    CAN tolerate levalbuterol   Hydroxyzine Other (See Comments)    QT prolongation related   Imitrex [Sumatriptan] Nausea And Vomiting and Other (See Comments)    Increased heart rate   Latex Itching   Montelukast Other (See Comments)    Nightmares    Other Itching and Other (See Comments)    Plastic and artificial Christmas trees = Itching   Prozac [Fluoxetine] Other (See Comments)    Bad, vivid dreams   Trazodone And Nefazodone Other (See Comments)    Bad dreams 01/27/20 Pt is unsure if allergic to Nefazodone   Advair Diskus [Fluticasone-Salmeterol] Palpitations   Copper-Containing Compounds Rash and Other (See Comments)    Makes the face burn,  also    Social History:   Social History   Socioeconomic History   Marital status: Single    Spouse name: Not on file   Number of children: 0   Years of education: 12   Highest education level: Some college, no degree  Occupational History   Occupation: Chartered certified accountant: [employer]   Occupation: Unemployed  Tobacco Use   Smoking status: Former    Current packs/day: 0.00    Average packs/day: 0.3 packs/day for 5.0 years (1.3 ttl pk-yrs)    Types: Cigarettes    Start date: 03/11/2007    Quit date: 03/10/2012    Years since quitting: 11.1   Smokeless tobacco: Never  Vaping Use   Vaping status: Never Used  Substance and Sexual Activity   Alcohol use: Not Currently    Comment: Reports history of binge drinking; last in 2021   Drug use: Not Currently    Types: Marijuana, Cocaine   Sexual activity: Yes    Partners: Male    Birth control/protection: Condom  Other Topics Concern   Not on file  Social History Narrative   Regular exercise-no   Caffeine Use-yes, 1-2 cups of caffeine daily   Social Drivers of  Health   Financial Resource Strain: Patient Declined (11/24/2022)   Overall Financial Resource Strain (CARDIA)    Difficulty of Paying Living Expenses: Patient declined  Food Insecurity: No Food Insecurity (04/24/2023)   Hunger Vital Sign    Worried About Running Out of Food in the Last Year: Never true    Ran Out of Food in the Last Year: Never true  Transportation Needs: Unmet Transportation Needs (04/24/2023)   PRAPARE - Administrator, Civil Service (Medical): Yes    Lack of Transportation (Non-Medical): No  Physical Activity: Inactive (01/29/2023)   Received from [facility]   Exercise Vital Sign    Days of Exercise per Week: 0 days    Minutes of Exercise per Session: 0 min  Stress: Stress Concern Present (11/24/2022)   [employer] of Occupational Health - Occupational Stress Questionnaire    Feeling of Stress : Rather much  Social Connections: Socially Isolated (11/24/2022)   Social Connection and Isolation Panel [NHANES]    Frequency of Communication with Friends and Family: Three times a week    Frequency of Social Gatherings with Friends and Family: Three times a week    Attends Religious Services: Never    Active Member of Clubs or Organizations: No    Attends Banker Meetings: Not on file    Marital Status: Never married  Intimate Partner Violence: Not At Risk (04/24/2023)   Humiliation, Afraid, Rape, and Kick questionnaire    Fear of Current or Ex-Partner: No    Emotionally Abused: No    Physically Abused: No    Sexually Abused: No    Family History:   Family History  Problem Relation Age of Onset   Diabetes Mother    Hypertension Mother    Hyperlipidemia Mother    Kidney disease Mother        CKD stage 4   Pancreatic cancer Father        pancreatic   Hypertension Maternal Grandmother    Diabetes Maternal Grandmother    Heart failure Maternal Grandmother        CHF   Heart attack Maternal Grandfather    Hypertension Maternal  Grandfather    Hypertension Paternal Grandmother    Alzheimer's disease Paternal  Grandmother    Hypertension Paternal Grandfather    Cancer Maternal Uncle        melanoma   Cancer Paternal Uncle        kidney   Colon cancer Neg Hx    Esophageal cancer Neg Hx    Stomach cancer Neg Hx      ROS:  Please see the history of present illness. All other ROS reviewed and negative.     Physical Exam/Data:   Vitals:   04/24/23 1200 04/24/23 1215 04/24/23 1230 04/24/23 1314  BP: (!) 108/55  (!) 116/58   Pulse: 88 87 89   Resp: 15 15 (!) 22   Temp:    99.3 F (37.4 C)  TempSrc:    Oral  SpO2: 92% 95% 96%   Weight:      Height:        Intake/Output Summary (Last 24 hours) at 04/24/2023 1450 Last data filed at 04/23/2023 2000 Gross per 24 hour  Intake --  Output 900 ml  Net -900 ml      04/23/2023    4:16 PM 01/20/2023    3:10 PM 12/29/2022    7:05 PM  Last 3 Weights  Weight (lbs) 442 lb 14.5 oz 412 lb 399 lb 0.5 oz  Weight (kg) 200.9 kg 186.882 kg 181 kg     Body mass index is 73.7 kg/m.  General: Morbidly obese HEENT: normal Neck: Difficult to assess JVD Vascular: No carotid bruits; Distal pulses 2+ bilaterally Cardiac:  normal S1, S2; RRR; no murmur  Lungs:  clear to auscultation bilaterally, no wheezing, rhonchi or rales  Abd: soft, nontender, no hepatomegaly  Ext: 2+ edema Musculoskeletal:  No deformities, BUE and BLE strength normal and equal Skin: warm and dry  Neuro:  CNs 2-12 intact, no focal abnormalities noted Psych:  Normal affect   EKG:  The EKG was personally reviewed and demonstrates: Sinus rhythm heart rate 86.  No acute ST-T wave changes. Telemetry:  Telemetry was personally reviewed and demonstrates: Sinus with heart rates in the 80s.  Relevant CV Studies: Right left heart catheterization 12/09/2021 [facility] Findings:   Pressures   RA 29 mmHg/27 mmHg/24 mmHg  RV 62 mmHg/16 mmHg/15 mmHg  PA 61 mmHg/39 mmHg/49 mmHg  PCW 35 mmHg/40 mmHg/34 mmHg  LA  /  /   LV 122 mmHg/29 mmHg/40 mmHg  AO 128 mmHg/80 mmHg/101 mmHg  BP 103/47    Saturations   IVC Saturation O2-Sat    SVC Saturation O2-Sat    RA Saturation O2-Sat    RV Saturation O2-Sat    PA Saturation O2-Sat 51.3  PA Saturation O2-Cont 53.1  PCW Saturation O2-Sat    AO Saturation O2-Sat 98  AO Saturation O2-cont 103.3    Derived Parameters   PVR 102.3  PVR Index 295.1  SVR 525.1  SVR Index 1515    Cardiac Output   Fick A-V O2 Diff 50.1 mL/L  Fick O2 Consumption    Fick Predicted O2 Consumption 290.2 mL/min    Ventricular Data   LV Max dp/dt 1340 mmHg/s  LV Max dp/dt/p    RV Max dp/dt 266 mmHg/s  RV Max dp/dt/p       Estimated Blood Loss: 5 cc  Cardiac Output (CO) Fick:  5.79 l/min  Cardiac Index (CI) Fick:  2.01 l/min/m2  Cardiac Output (CO) Thermodilution:  11.73 l/min  Cardiac Index (CI) Thermodilution:  4.07 l/min/m2   LHC Findings:  --LM: angiographically normal  --LAD: angiographically normal  --LCx:  co-dominant; angiographically normal  --RCA: co-dominant; angiographically normal   --LVEDP: 32 mmHg   Comments: severely elevated intra-cardiac filling pressures;  angiographically normal coronaries; will change diuretic from furosemide  to torsemide for better gut absorption   Echocardiogram 03/10/2023 The left ventricular size is normal.  There is normal left ventricular wall thickness.  Left ventricular systolic function is normal.  LV ejection fraction = 60-65%.  The left ventricular wall motion is normal.  Moderate diastolic dysfunction [pseudonormal pattern] with elevated left atrial pressure.  There is aortic valve sclerosis.  There is no significant valvular stenosis or regurgitation.  Moderate pulmonary hypertension.  IVC size was moderately dilated, suggesting elevated right atrial pressure.  There is no pericardial effusion.  Compared to the prior study, estimated pulmonary artery pressures were higher and aortic valve  velocities  were lower, not meeting criteria for aortic stenosis.   Laboratory Data:  High Sensitivity Troponin:   Recent Labs  Lab 04/23/23 2000 04/23/23 2145  TROPONINIHS 7 7     Chemistry Recent Labs  Lab 04/23/23 2000 04/24/23 0624  NA 137 139  K 3.1* 3.5  CL 98 100  CO2 28 27  GLUCOSE 128* 141*  BUN 6 5*  CREATININE 0.84 0.81  CALCIUM 8.4* 8.3*  GFRNONAA >60 >60  ANIONGAP 11 12    Recent Labs  Lab 04/23/23 2000  PROT 6.4*  ALBUMIN 2.9*  AST 44*  ALT 28  ALKPHOS 88  BILITOT 1.1   Lipids No results for input(s): "CHOL", "TRIG", "HDL", "LABVLDL", "LDLCALC", "CHOLHDL" in the last 168 hours.  Hematology Recent Labs  Lab 04/23/23 2000 04/24/23 0624  WBC 10.9* 7.6  RBC 3.60* 3.43*  HGB 9.3* 8.8*  HCT 30.6* 29.9*  MCV 85.0 87.2  MCH 25.8* 25.7*  MCHC 30.4 29.4*  RDW 17.0* 17.2*  PLT 215 205   Thyroid No results for input(s): "TSH", "FREET4" in the last 168 hours.  BNP Recent Labs  Lab 04/23/23 2000  BNP 66.4    DDimer No results for input(s): "DDIMER" in the last 168 hours.   Radiology/Studies:  DG Chest Port 1 View Result Date: 04/23/2023 CLINICAL DATA:  Shortness of breath, fluid overload EXAM: PORTABLE CHEST 1 VIEW COMPARISON:  03/10/2023 FINDINGS: Mild enlargement of the cardiopericardial silhouette. Airway thickening is present, suggesting bronchitis or reactive airways disease. Stable bandlike densities in both mid lungs favoring scarring. No blunting of the costophrenic angles. Indistinct pulmonary vasculature, cannot exclude pulmonary venous hypertension. No overt edema. Lower thoracic spondylosis. IMPRESSION: 1. Mild enlargement of the cardiopericardial silhouette with indistinct pulmonary vasculature, cannot exclude pulmonary venous hypertension. No overt edema. 2. Airway thickening is present, suggesting bronchitis or reactive airways disease. 3. Stable bandlike densities in both mid lungs favoring scarring. Electronically Signed   By: Gaylyn Rong  M.D.   On: 04/23/2023 17:54     Assessment and Plan:   Acute on chronic HFpEF Pulmonary hypertension Just recently admitted in January 2025 for same issue.  Reports weight DC weight back at home being 385.  Here today she is 440 despite compliancy with her Bumex, reports good response but does not have consistent urination throughout the day.  Also maintains 2 L fluid restriction, low-sodium diet.  She has had previous right and left heart catheterization 2023 demonstrating normal coronary anatomy, preserved cardiac output.  RVSP 62, wedge 34, PA pressure 61/39, PVR 102, 1.3 Wood units.  Pulmonary hypertension likely related to obesity/hypoventilation syndrome, untreated OSA does not tolerate CPAP. Increase IV Lasix from 40  mg to 60 mg twice daily.  Wonder if she would benefit from 3 times daily dosing if having short response.   Reportedly did not have as good diuresis on p.o. Lasix or torsemide.  Responds well to Bumex though. Restart spironolactone 25 mg. Started on midodrine due to low blood pressures, question accuracy given this has been forearm measurements.  Will discontinue this for now.  May need to consider getting A-line line for better BP measurements. Repeating echocardiogram here.  Although presentation likely related to obesity/hypoventilation may need to rule out other diastolic dysfunction etiologies  Paroxysmal atrial fibrillation Had a TEE/DCCV in 2021 and has been maintaining sinus since then and while being here.  At 1 time was on dofetilide, eventually stopped after missing a dose and not wanting to continue.  Not deemed a candidate for ablation given body habitus. Continue with Eliquis 5 mg twice daily.  PTA on propranolol 80 mg 3 times daily, which seems a little strange. No known thyroid dysfunction that I can see.  Will discuss with MD if we want to continue this regimen here.  BMI 73 status post gastric sleeve Likely OSA We talked very in depthly and seriously about  the need for weight loss and treatment of her OSA.  Does not tolerate the mask, but willing to try again here.  Little bit tearful but realizes if these 2 things do not improve overall prognosis long-term likely poor.  At 1 point was on Ozempic.  Possible cellulitis Both lower extremities feel very warm and look red to the touch.  I know bilateral cellulitis less likely but addressing this here for follow-up by primary team.   Risk Assessment/Risk Scores:   New York Heart Association (NYHA) Functional Class NYHA Class IV  CHA2DS2-VASc Score = 4  This indicates a 4.8% annual risk of stroke. The patient's score is based upon: CHF History: 1 HTN History: 1 Diabetes History: 1 Stroke History: 0 Vascular Disease History: 0 Age Score: 0 Gender Score: 1    For questions or updates, please contact [facility] Please consult [email or web address] for contact info under    Signed, Abagail Kitchens, PA-C  04/24/2023 2:50 PM

## 2023-04-24 NOTE — Progress Notes (Signed)
  Echocardiogram 2D Echocardiogram has been performed.  Delcie Roch 04/24/2023, 4:11 PM

## 2023-04-25 ENCOUNTER — Inpatient Hospital Stay (HOSPITAL_COMMUNITY): Payer: Medicaid Other

## 2023-04-25 DIAGNOSIS — R509 Fever, unspecified: Secondary | ICD-10-CM | POA: Insufficient documentation

## 2023-04-25 DIAGNOSIS — L03115 Cellulitis of right lower limb: Secondary | ICD-10-CM

## 2023-04-25 DIAGNOSIS — I480 Paroxysmal atrial fibrillation: Secondary | ICD-10-CM | POA: Diagnosis not present

## 2023-04-25 DIAGNOSIS — F418 Other specified anxiety disorders: Secondary | ICD-10-CM | POA: Diagnosis not present

## 2023-04-25 DIAGNOSIS — E119 Type 2 diabetes mellitus without complications: Secondary | ICD-10-CM | POA: Diagnosis not present

## 2023-04-25 DIAGNOSIS — R197 Diarrhea, unspecified: Secondary | ICD-10-CM | POA: Insufficient documentation

## 2023-04-25 DIAGNOSIS — I5033 Acute on chronic diastolic (congestive) heart failure: Secondary | ICD-10-CM | POA: Diagnosis not present

## 2023-04-25 LAB — URINALYSIS, COMPLETE (UACMP) WITH MICROSCOPIC
Bilirubin Urine: NEGATIVE
Glucose, UA: NEGATIVE mg/dL
Ketones, ur: 5 mg/dL — AB
Nitrite: NEGATIVE
Protein, ur: 30 mg/dL — AB
Specific Gravity, Urine: 1.024 (ref 1.005–1.030)
pH: 5 (ref 5.0–8.0)

## 2023-04-25 LAB — BASIC METABOLIC PANEL
Anion gap: 9 (ref 5–15)
BUN: 7 mg/dL (ref 6–20)
CO2: 30 mmol/L (ref 22–32)
Calcium: 7.8 mg/dL — ABNORMAL LOW (ref 8.9–10.3)
Chloride: 99 mmol/L (ref 98–111)
Creatinine, Ser: 0.89 mg/dL (ref 0.44–1.00)
GFR, Estimated: >60 mL/min (ref >60)
Glucose, Bld: 152 mg/dL — ABNORMAL HIGH (ref 70–99)
Potassium: 3.1 mmol/L — ABNORMAL LOW (ref 3.5–5.1)
Sodium: 138 mmol/L (ref 135–145)

## 2023-04-25 LAB — CBC
HCT: 31.6 % — ABNORMAL LOW (ref 36.0–46.0)
Hemoglobin: 9.5 g/dL — ABNORMAL LOW (ref 12.0–15.0)
MCH: 26.1 pg (ref 26.0–34.0)
MCHC: 30.1 g/dL (ref 30.0–36.0)
MCV: 86.8 fL (ref 80.0–100.0)
Platelets: 166 10*3/uL (ref 150–400)
RBC: 3.64 MIL/uL — ABNORMAL LOW (ref 3.87–5.11)
RDW: 17.4 % — ABNORMAL HIGH (ref 11.5–15.5)
WBC: 6.1 10*3/uL (ref 4.0–10.5)
nRBC: 0 % (ref 0.0–0.2)

## 2023-04-25 LAB — GLUCOSE, CAPILLARY
Glucose-Capillary: 132 mg/dL — ABNORMAL HIGH (ref 70–99)
Glucose-Capillary: 187 mg/dL — ABNORMAL HIGH (ref 70–99)
Glucose-Capillary: 126 mg/dL — ABNORMAL HIGH (ref 70–99)
Glucose-Capillary: 156 mg/dL — ABNORMAL HIGH (ref 70–99)

## 2023-04-25 MED ORDER — POTASSIUM CHLORIDE 10 MEQ/100ML IV SOLN
10.0000 meq | INTRAVENOUS | Status: AC
  Administered 2023-04-25 (×5): 10 meq via INTRAVENOUS
  Filled 2023-04-25 (×5): qty 100

## 2023-04-25 MED ORDER — SODIUM CHLORIDE 0.9 % IV SOLN
2.0000 g | INTRAVENOUS | Status: DC
  Administered 2023-04-25 – 2023-04-29 (×4): 2 g via INTRAVENOUS
  Filled 2023-04-25 (×4): qty 20

## 2023-04-25 MED ORDER — POTASSIUM CHLORIDE CRYS ER 20 MEQ PO TBCR
40.0000 meq | EXTENDED_RELEASE_TABLET | ORAL | Status: DC
  Administered 2023-04-25: 40 meq via ORAL
  Filled 2023-04-25 (×2): qty 2

## 2023-04-25 MED ORDER — POTASSIUM CHLORIDE CRYS ER 20 MEQ PO TBCR
40.0000 meq | EXTENDED_RELEASE_TABLET | Freq: Every day | ORAL | Status: DC
  Administered 2023-04-26 – 2023-04-29 (×4): 40 meq via ORAL
  Filled 2023-04-25 (×4): qty 2

## 2023-04-25 MED ORDER — POTASSIUM CHLORIDE CRYS ER 20 MEQ PO TBCR: 40.0000 meq | EXTENDED_RELEASE_TABLET | Freq: Every day | ORAL | Status: DC

## 2023-04-25 NOTE — Progress Notes (Signed)
 MD ordered Cdiff panel and GI panel. Stool is too watery to be send down to lab. Will send down sample when stool is more formed.

## 2023-04-25 NOTE — Progress Notes (Signed)
 Patient Name: Alyssa Blanchard Date of Encounter: 04/25/2023 [facility] Cardiologist: Chilton Si, MD   Interval Summary  .    53 year old female with probable mixed high-output heart failure and right-sided heart failure due to untreated OSA/OHS with pulmonary hypertension by outside right heart cath presenting with quick turnaround readmission for recurrent heart failure.  I personally reviewed her echocardiogram demonstrating normal biventricular size and function with normal diastolic function.  Elevated flow velocities across all valves, elevated RVSP 39 mmHg.  Findings most consistent with high-output heart failure, right heart cath performed outside did not clearly document any step up or shunt physiology.  Patient is somnolent in the room but awakens to questioning and appears drowsy.  Communicates clearly and asked questions about how to fix her condition.  We discussed weight loss as management strategy for high-output heart failure, and treatment of OSA which she will need to reconsider CPAP, possibly BiPAP depending on titration.  She was recently admitted to the outside hospital for very similar symptoms, attributed to diastolic heart failure though diastolic function is normal by echo.  Vital Signs .    Vitals:   04/25/23 0707 04/25/23 0712 04/25/23 0800 04/25/23 1034  BP: (!) 136/53   (!) 134/59  Pulse:   96   Resp:   17 18  Temp: 99.8 F (37.7 C) (!) 101 F (38.3 C)  99.9 F (37.7 C)  TempSrc: Oral Oral  Oral  SpO2:   98%   Weight:      Height:        Intake/Output Summary (Last 24 hours) at 04/25/2023 1130 Last data filed at 04/25/2023 0947 Gross per 24 hour  Intake 480 ml  Output 3300 ml  Net -2820 ml      04/25/2023    5:00 AM 04/24/2023    4:48 PM 04/23/2023    4:16 PM  Last 3 Weights  Weight (lbs) 431 lb 10.6 oz 439 lb 6 oz 442 lb 14.5 oz  Weight (kg) 195.8 kg 199.3 kg 200.9 kg      Telemetry/ECG    Sinus rhythm, PVCs, brief episode  of atrial tachycardia this morning, asymptomatic- Personally Reviewed  Physical Exam .   GEN: No acute distress.   Neck: No JVD Cardiac: RRR, no murmurs, rubs, or gallops.  Respiratory: Clear to auscultation bilaterally. GI: Soft, nontender, non-distended  MS: 3++ diffuse edema  Assessment & Plan .     High-output heart failure Pulmonary hypertension  Untreated OSA Obesity hypoventilation syndrome -Mixed heart failure type, likely predominantly high-output heart failure with component of right-sided heart failure from untreated OSA and OHS, with pulmonary hypertension on outside cath, PA mean 49 mmHg, wedge 34 mmHg.  Appears to have had a simultaneous left and right heart cath at the outside facility with direct LV pressure, no mention in the report of constrictive physiology. -Will continue diuretic, doing reasonably well on current dose of Lasix 60 mg IV twice daily. -Heart failure therapy currently includes spironolactone 25 mg daily blood pressure challenging to measure but has been grossly normal to low.  Initially prescribed midodrine, but unless symptomatic likely does not need right now.  Hypokalemia -Likely secondary to diuresis and diarrhea, primary service is replating aggressively, agree.  Paroxysmal atrial fibrillation -Previously undergone TEE cardioversion in 2021, maintaining sinus rhythm.  At 1 point was taking dofetilide but eventually stopped and did not want to resume.  Felt to not be a candidate for ablation at this time given body habitus and  untreated OSA.  She continues on Eliquis 5 mg twice daily and at home takes propranolol.  Cellulitis -Warm red lower extremities, primary team is addressing cellulitis.   For questions or updates, please contact [facility] Please consult [email or web address] for contact info under        Signed, Parke Poisson, MD

## 2023-04-25 NOTE — Progress Notes (Signed)
 PROGRESS NOTE    Alyssa Blanchard  ZOX:[id number] DOB: 1971/03/08 DOA: 04/23/2023 PCP: Lenox Ponds, MD    Chief Complaint  Patient presents with   Shortness of Breath    Brief Narrative:  Patient is a 53 year old, morbidly obese female with history of chronic HFpEF, PAF on Eliquis, asthma, type 2 diabetes, hypertension, depression/anxiety presented to the ED and admitted for acute on chronic HFpEF.  Cardiology consulted   Assessment & Plan:   Principal Problem:   Acute on chronic heart failure with preserved ejection fraction (HFpEF) (HCC) Active Problems:   Cellulitis of right leg   Paroxysmal atrial fibrillation (HCC)   Type 2 diabetes mellitus without complication, without long-term current use of insulin (HCC)   Asthma   Depression with anxiety   Hypertension   Fever   Diarrhea  #1 acute on chronic HFpEF -Patient presented with progressive dyspnea on exertion, weight gain, peripheral edema. -BNP noted at 66.4. -Patient recently admitted at [facility] weight on discharge at the time was 179 kg on discharge weight on presentation noted at 200.9 kg. -Patient endorses dietary compliance as well as medication compliance. -Patient noted in the ED to have some low blood pressure readings however concern for accuracy likely limited due to body habitus. -Recent 2D echo at outside hospital on 03/10/2023 with a EF of 60 to 65%, moderate pulmonary hypertension. -Repeat 2D echo with EF of 70 to 75%,NWMA, normal right ventricular systolic function, mildly elevated pulmonary artery systolic pressure. -Patient initially on admission was placed on Lasix 40 mg IV every 12 hours which was increased to 60 mg every 12 hours per cardiology with a urine output of 1.9 L over the past 24 hours.  -Current weight of 195.8 kg from 200.9 kg on admission. -Patient started on midodrine for BP support which has been discontinued, BP currently stable.. -Spironolactone resumed per cardiology.    -Strict I's and O's, daily weights. -Continue CPAP nightly if able to. -Cardiology following and appreciate their input and recommendations.    2.  Paroxysmal A-fib -Currently normal sinus rhythm. -Rate currently controlled. -Noted to be on propranolol 80 mg 3 times daily which is currently on hold. -Eliquis for anticoagulation.  3.  Fever -Patient noted to have a temp of 101 this morning. -Patient with nausea and emesis this morning noted to have watery diarrhea per RN. -Checking C. difficile PCR, check a GI pathogen panel, check blood cultures x 2, check UA with cultures and sensitivities, check a chest x-ray. -Patient with concern for cellulitis on right upper thigh and as such we will place empirically on IV Rocephin pending culture results.  4.  Probable right thigh cellulitis -Patient with erythema of the right thigh with some warmth, nontender to palpation. -Place empirically on IV Rocephin.  5.  Diarrhea -Patient noted to have watery diarrhea this morning with some associated nausea and vomiting per RN. -Checking C. difficile PCR, check a GI pathogen panel. -Supportive care.  6.  Hypertension -On admission patient noted to have intermittently low BPs however concern for accuracy given body habitus. -Antihypertensive medications on hold while patient on IV diuretics. -Patient initially started on midodrine which has been discontinued per cardiology.  7.  Asthma -Continue Breo Ellipta and Xopenex inhaler as needed.  8.  Well-controlled type 2 diabetes mellitus -Hemoglobin A1c 6.7% on 03/09/2023. -CBG 132 this morning. -SSI.  9.  Depression/anxiety -Stable. -Not on any medications per med rec. -Outpatient follow-up.  10.  Morbid obesity -BMI 73.7 kg/m -Patient  may have underlying OSA/OHS. -Per admitting physician patient stated scheduled for sleep study in April. -Lifestyle modification. -Outpatient follow-up with PCP.   DVT prophylaxis: Eliquis Code  Status: Full Family Communication: Updated patient.  No family at bedside. Disposition: TBD  Status is: Inpatient Remains inpatient appropriate because: Severity of illness   Consultants:  Cardiology: Dr. Jacques Navy 04/24/2023  Procedures:  Chest x-ray 04/23/2023 2D echo 04/24/2023  Antimicrobials:  Anti-infectives (From admission, onward)    Start     Dose/Rate Route Frequency Ordered Stop   04/25/23 1115  cefTRIAXone (ROCEPHIN) 2 g in sodium chloride 0.9 % 100 mL IVPB        2 g 200 mL/hr over 30 Minutes Intravenous Every 24 hours 04/25/23 1025           Subjective: Patient laying in bed, states does not feel too well.  Patient noted to be drowsy however just received IV Compazine for nausea and emesis.  Noted to have some nausea and emesis earlier on this morning.  Per RN patient with watery loose stools.  Patient states no significant change or shortness of breath.  Denies any chest pain.  Stated had some abdominal pain however denies any ongoing abdominal pain at this time.  Patient noted to have a fever this morning with a Tmax of 101.     Objective: Vitals:   04/25/23 0707 04/25/23 0712 04/25/23 0800 04/25/23 1034  BP: (!) 136/53   (!) 134/59  Pulse:   96   Resp:   17 18  Temp: 99.8 F (37.7 C) (!) 101 F (38.3 C)  99.9 F (37.7 C)  TempSrc: Oral Oral  Oral  SpO2:   98%   Weight:      Height:        Intake/Output Summary (Last 24 hours) at 04/25/2023 1044 Last data filed at 04/25/2023 1027 Gross per 24 hour  Intake 480 ml  Output 3300 ml  Net -2820 ml   Filed Weights   04/23/23 1616 04/24/23 1648 04/25/23 0500  Weight: (!) 200.9 kg (!) 199.3 kg (!) 195.8 kg    Examination:  General exam: Drowsy. Respiratory system: Bibasilar crackles.  Distant breath sounds.  No wheezing.  Fair air movement.  Speaking in full sentences.   Cardiovascular system: Distant heart sounds due to body habitus.  RRR.  Unable to assess JVD due to neck thickness.  1-2+ bilateral  lower extremity edema.  Gastrointestinal system: Abdomen is obese, nondistended, soft and nontender. No organomegaly or masses felt. Normal bowel sounds heard. Central nervous system: Drowsy. No focal neurological deficits. Extremities: Right thigh region with some erythema, warmth, nontender to palpation.  Symmetric 5 x 5 power. Skin: No rashes, lesions or ulcers Psychiatry: Judgement and insight appear normal. Mood & affect appropriate.     Data Reviewed: I have personally reviewed following labs and imaging studies  CBC: Recent Labs  Lab 04/23/23 2000 04/24/23 0624 04/25/23 0201  WBC 10.9* 7.6 6.1  HGB 9.3* 8.8* 9.5*  HCT 30.6* 29.9* 31.6*  MCV 85.0 87.2 86.8  PLT 215 205 166    Basic Metabolic Panel: Recent Labs  Lab 04/23/23 2000 04/24/23 0624 04/25/23 0201  NA 137 139 138  K 3.1* 3.5 3.1*  CL 98 100 99  CO2 28 27 30   GLUCOSE 128* 141* 152*  BUN 6 5* 7  CREATININE 0.84 0.81 0.89  CALCIUM 8.4* 8.3* 7.8*    GFR: Estimated Creatinine Clearance: 131.3 mL/min (by C-G formula based on SCr of 0.89  mg/dL).  Liver Function Tests: Recent Labs  Lab 04/23/23 2000  AST 44*  ALT 28  ALKPHOS 88  BILITOT 1.1  PROT 6.4*  ALBUMIN 2.9*    CBG: Recent Labs  Lab 04/24/23 0818 04/24/23 1215 04/24/23 1656 04/24/23 2039 04/25/23 0603  GLUCAP 148* 148* 168* 177* 132*     Recent Results (from the past 240 hours)  Resp panel by RT-PCR (RSV, Flu A&B, Covid) Anterior Nasal Swab     Status: None   Collection Time: 04/23/23  5:57 PM   Specimen: Anterior Nasal Swab  Result Value Ref Range Status   SARS Coronavirus 2 by RT PCR NEGATIVE NEGATIVE Final   Influenza A by PCR NEGATIVE NEGATIVE Final   Influenza B by PCR NEGATIVE NEGATIVE Final    Comment: (NOTE) The Xpert Xpress SARS-CoV-2/FLU/RSV plus assay is intended as an aid in the diagnosis of influenza from Nasopharyngeal swab specimens and should not be used as a sole basis for treatment. Nasal washings  and aspirates are unacceptable for Xpert Xpress SARS-CoV-2/FLU/RSV testing.  Fact Sheet for Patients: BloggerCourse.com  Fact Sheet for Healthcare Providers: SeriousBroker.it  This test is not yet approved or cleared by the [place] FDA and has been authorized for detection and/or diagnosis of SARS-CoV-2 by FDA under an Emergency Use Authorization (EUA). This EUA will remain in effect (meaning this test can be used) for the duration of the COVID-19 declaration under Section 564(b)(1) of the Act, 21 U.S.C. section 360bbb-3(b)(1), unless the authorization is terminated or revoked.     Resp Syncytial Virus by PCR NEGATIVE NEGATIVE Final    Comment: (NOTE) Fact Sheet for Patients: BloggerCourse.com  Fact Sheet for Healthcare Providers: SeriousBroker.it  This test is not yet approved or cleared by the [place] FDA and has been authorized for detection and/or diagnosis of SARS-CoV-2 by FDA under an Emergency Use Authorization (EUA). This EUA will remain in effect (meaning this test can be used) for the duration of the COVID-19 declaration under Section 564(b)(1) of the Act, 21 U.S.C. section 360bbb-3(b)(1), unless the authorization is terminated or revoked.  Performed at [facility], [address], [place], [place] [address]          Radiology Studies: ECHOCARDIOGRAM COMPLETE Result Date: 04/24/2023    ECHOCARDIOGRAM REPORT   Patient Name:   Alyssa Blanchard Date of Exam: 04/24/2023 Medical Rec #:  [id number]         Height:       65.0 in Accession #:    [id number]        Weight:       442.9 lb Date of Birth:  1971-02-22         BSA:          2.773 m Patient Age:    52 years          BP:           116/58 mmHg Patient Gender: F                 HR:           99 bpm. Exam Location:  Inpatient Procedure: 2D Echo (Both Spectral and Color Flow Doppler were utilized  during            procedure). Indications:    congestive heart failure  History:        Patient has prior history of Echocardiogram examinations, most  recent 08/05/2019. Arrythmias:Atrial Fibrillation; Risk                 Factors:Diabetes.  Sonographer:    Delcie Roch RDCS Referring Phys: [id number] Lynda Rainwater A ACHARYA IMPRESSIONS  1. Left ventricular ejection fraction, by estimation, is 70 to 75%. The left ventricle has hyperdynamic function. The left ventricle has no regional wall motion abnormalities. There is mild left ventricular hypertrophy. Left ventricular diastolic parameters were normal (medial e' 9.7 cm/s).  2. Right ventricular systolic function is normal. The right ventricular size is normal. There is mildly elevated pulmonary artery systolic pressure. The estimated right ventricular systolic pressure is 38.5 mmHg.  3. The mitral valve is grossly normal. Trivial mitral valve regurgitation. No evidence of mitral stenosis.  4. The aortic valve was not well visualized. There is mild calcification of the aortic valve. Aortic valve regurgitation is not visualized. No aortic stenosis is present.  5. The inferior vena cava is dilated in size with >50% respiratory variability, suggesting right atrial pressure of 8 mmHg.  6. Increased flow velocities may be secondary to anemia, thyrotoxicosis, hyperdynamic or high flow state. FINDINGS  Left Ventricle: Left ventricular ejection fraction, by estimation, is 70 to 75%. The left ventricle has hyperdynamic function. The left ventricle has no regional wall motion abnormalities. The left ventricular internal cavity size was normal in size. There is mild left ventricular hypertrophy. Left ventricular diastolic parameters were normal. Right Ventricle: The right ventricular size is normal. No increase in right ventricular wall thickness. Right ventricular systolic function is normal. There is mildly elevated pulmonary artery systolic pressure. The  tricuspid regurgitant velocity is 2.76  m/s, and with an assumed right atrial pressure of 8 mmHg, the estimated right ventricular systolic pressure is 38.5 mmHg. Left Atrium: Left atrial size was normal in size. Right Atrium: Right atrial size was normal in size. Pericardium: There is no evidence of pericardial effusion. Mitral Valve: The mitral valve is grossly normal. Trivial mitral valve regurgitation. No evidence of mitral valve stenosis. Tricuspid Valve: The tricuspid valve is normal in structure. Tricuspid valve regurgitation is trivial. No evidence of tricuspid stenosis. Aortic Valve: The aortic valve was not well visualized. There is mild calcification of the aortic valve. Aortic valve regurgitation is not visualized. No aortic stenosis is present. Aortic valve mean gradient measures 15.0 mmHg. Aortic valve peak gradient measures 27.4 mmHg. Aortic valve area, by VTI measures 2.19 cm. Pulmonic Valve: The pulmonic valve was not well visualized. Pulmonic valve regurgitation is trivial. No evidence of pulmonic stenosis. Aorta: The aortic root is normal in size and structure. Venous: The inferior vena cava is dilated in size with greater than 50% respiratory variability, suggesting right atrial pressure of 8 mmHg. IAS/Shunts: The interatrial septum was not well visualized.  LEFT VENTRICLE PLAX 2D LVIDd:         4.90 cm   Diastology LVIDs:         3.40 cm   LV e' medial:    9.68 cm/s LV PW:         1.20 cm   LV E/e' medial:  14.4 LV IVS:        1.10 cm   LV e' lateral:   15.00 cm/s LVOT diam:     1.80 cm   LV E/e' lateral: 9.3 LV SV:         91 LV SV Index:   33 LVOT Area:     2.54 cm  RIGHT VENTRICLE  IVC RV Basal diam:  2.80 cm     IVC diam: 2.20 cm RV S prime:     17.50 cm/s TAPSE (M-mode): 3.0 cm LEFT ATRIUM             Index        RIGHT ATRIUM           Index LA diam:        4.20 cm 1.51 cm/m   RA Area:     15.50 cm LA Vol (A2C):   42.7 ml 15.40 ml/m  RA Volume:   37.40 ml  13.49 ml/m LA Vol  (A4C):   70.8 ml 25.53 ml/m LA Biplane Vol: 56.2 ml 20.26 ml/m  AORTIC VALVE AV Area (Vmax):    2.05 cm AV Area (Vmean):   2.12 cm AV Area (VTI):     2.19 cm AV Vmax:           261.50 cm/s AV Vmean:          181.000 cm/s AV VTI:            0.417 m AV Peak Grad:      27.4 mmHg AV Mean Grad:      15.0 mmHg LVOT Vmax:         211.00 cm/s LVOT Vmean:        150.500 cm/s LVOT VTI:          0.359 m LVOT/AV VTI ratio: 0.86  AORTA Ao Root diam: 3.00 cm Ao Asc diam:  3.70 cm MITRAL VALVE                TRICUSPID VALVE MV Area (PHT): 4.60 cm     TR Peak grad:   30.5 mmHg MV Decel Time: 165 msec     TR Vmax:        276.00 cm/s MV E velocity: 139.00 cm/s MV A velocity: 117.00 cm/s  SHUNTS MV E/A ratio:  1.19         Systemic VTI:  0.36 m                             Systemic Diam: 1.80 cm Weston Brass MD Electronically signed by Weston Brass MD Signature Date/Time: 04/24/2023/6:20:08 PM    Final    DG Chest Port 1 View Result Date: 04/23/2023 CLINICAL DATA:  Shortness of breath, fluid overload EXAM: PORTABLE CHEST 1 VIEW COMPARISON:  03/10/2023 FINDINGS: Mild enlargement of the cardiopericardial silhouette. Airway thickening is present, suggesting bronchitis or reactive airways disease. Stable bandlike densities in both mid lungs favoring scarring. No blunting of the costophrenic angles. Indistinct pulmonary vasculature, cannot exclude pulmonary venous hypertension. No overt edema. Lower thoracic spondylosis. IMPRESSION: 1. Mild enlargement of the cardiopericardial silhouette with indistinct pulmonary vasculature, cannot exclude pulmonary venous hypertension. No overt edema. 2. Airway thickening is present, suggesting bronchitis or reactive airways disease. 3. Stable bandlike densities in both mid lungs favoring scarring. Electronically Signed   By: Gaylyn Rong M.D.   On: 04/23/2023 17:54        Scheduled Meds:  apixaban  5 mg Oral BID   diclofenac Sodium  2 g Topical BID   furosemide  60 mg  Intravenous BID   insulin aspart  0-5 Units Subcutaneous QHS   insulin aspart  0-9 Units Subcutaneous TID WC   iron polysaccharides  150 mg Oral Daily   magnesium oxide  400 mg Oral Daily   melatonin  10 mg Oral QHS   mometasone-formoterol  1 puff Inhalation Daily   pantoprazole  40 mg Oral BID   potassium chloride  40 mEq Oral Q4H   [START ON 04/26/2023] potassium chloride  40 mEq Oral Daily   pregabalin  200 mg Oral BID   sodium chloride flush  3 mL Intravenous Q12H   spironolactone  25 mg Oral Daily   Continuous Infusions:  cefTRIAXone (ROCEPHIN)  IV       LOS: 2 days    Time spent: 45 minutes    Ramiro Harvest, MD [facility]   To contact the attending provider between 7A-7P or the covering provider during after hours 7P-7A, please log into the web site [email or web address] and access using universal [place] password for that web site. If you do not have the password, please call the hospital operator.  04/25/2023, 10:44 AM

## 2023-04-25 NOTE — Plan of Care (Signed)

## 2023-04-26 ENCOUNTER — Inpatient Hospital Stay (HOSPITAL_COMMUNITY): Payer: Medicaid Other

## 2023-04-26 DIAGNOSIS — L03115 Cellulitis of right lower limb: Secondary | ICD-10-CM | POA: Diagnosis not present

## 2023-04-26 DIAGNOSIS — I5033 Acute on chronic diastolic (congestive) heart failure: Secondary | ICD-10-CM | POA: Diagnosis not present

## 2023-04-26 DIAGNOSIS — N390 Urinary tract infection, site not specified: Secondary | ICD-10-CM

## 2023-04-26 DIAGNOSIS — J45909 Unspecified asthma, uncomplicated: Secondary | ICD-10-CM | POA: Diagnosis not present

## 2023-04-26 DIAGNOSIS — I10 Essential (primary) hypertension: Secondary | ICD-10-CM | POA: Diagnosis not present

## 2023-04-26 DIAGNOSIS — N3000 Acute cystitis without hematuria: Secondary | ICD-10-CM

## 2023-04-26 DIAGNOSIS — E876 Hypokalemia: Secondary | ICD-10-CM

## 2023-04-26 DIAGNOSIS — I4891 Unspecified atrial fibrillation: Secondary | ICD-10-CM

## 2023-04-26 LAB — CBC WITH DIFFERENTIAL/PLATELET
Abs Immature Granulocytes: 0.04 10*3/uL (ref 0.00–0.07)
Basophils Absolute: 0 10*3/uL (ref 0.0–0.1)
Basophils Relative: 0 %
Eosinophils Absolute: 0.1 10*3/uL (ref 0.0–0.5)
Eosinophils Relative: 1 %
HCT: 29.2 % — ABNORMAL LOW (ref 36.0–46.0)
Hemoglobin: 8.8 g/dL — ABNORMAL LOW (ref 12.0–15.0)
Immature Granulocytes: 1 %
Lymphocytes Relative: 15 %
Lymphs Abs: 1.2 10*3/uL (ref 0.7–4.0)
MCH: 26.4 pg (ref 26.0–34.0)
MCHC: 30.1 g/dL (ref 30.0–36.0)
MCV: 87.7 fL (ref 80.0–100.0)
Monocytes Absolute: 1.2 10*3/uL — ABNORMAL HIGH (ref 0.1–1.0)
Monocytes Relative: 14 %
Neutro Abs: 6 10*3/uL (ref 1.7–7.7)
Neutrophils Relative %: 69 %
Platelets: 128 10*3/uL — ABNORMAL LOW (ref 150–400)
RBC: 3.33 MIL/uL — ABNORMAL LOW (ref 3.87–5.11)
RDW: 17.4 % — ABNORMAL HIGH (ref 11.5–15.5)
WBC: 8.6 10*3/uL (ref 4.0–10.5)
nRBC: 0 % (ref 0.0–0.2)

## 2023-04-26 LAB — GLUCOSE, CAPILLARY
Glucose-Capillary: 127 mg/dL — ABNORMAL HIGH (ref 70–99)
Glucose-Capillary: 164 mg/dL — ABNORMAL HIGH (ref 70–99)
Glucose-Capillary: 140 mg/dL — ABNORMAL HIGH (ref 70–99)
Glucose-Capillary: 137 mg/dL — ABNORMAL HIGH (ref 70–99)

## 2023-04-26 LAB — BASIC METABOLIC PANEL
Anion gap: 12 (ref 5–15)
Anion gap: 11 (ref 5–15)
BUN: 7 mg/dL (ref 6–20)
BUN: 8 mg/dL (ref 6–20)
CO2: 31 mmol/L (ref 22–32)
CO2: 27 mmol/L (ref 22–32)
Calcium: 7.8 mg/dL — ABNORMAL LOW (ref 8.9–10.3)
Calcium: 7.7 mg/dL — ABNORMAL LOW (ref 8.9–10.3)
Chloride: 93 mmol/L — ABNORMAL LOW (ref 98–111)
Chloride: 96 mmol/L — ABNORMAL LOW (ref 98–111)
Creatinine, Ser: 0.83 mg/dL (ref 0.44–1.00)
Creatinine, Ser: 0.84 mg/dL (ref 0.44–1.00)
GFR, Estimated: >60 mL/min (ref >60)
GFR, Estimated: >60 mL/min (ref >60)
Glucose, Bld: 127 mg/dL — ABNORMAL HIGH (ref 70–99)
Glucose, Bld: 170 mg/dL — ABNORMAL HIGH (ref 70–99)
Potassium: 3.1 mmol/L — ABNORMAL LOW (ref 3.5–5.1)
Potassium: 4 mmol/L (ref 3.5–5.1)
Sodium: 136 mmol/L (ref 135–145)
Sodium: 134 mmol/L — ABNORMAL LOW (ref 135–145)

## 2023-04-26 LAB — IRON AND TIBC
Iron: 29 ug/dL (ref 28–170)
Saturation Ratios: 7 % — ABNORMAL LOW (ref 10.4–31.8)
TIBC: 392 ug/dL (ref 250–450)
UIBC: 363 ug/dL

## 2023-04-26 LAB — TSH: TSH: 0.867 u[IU]/mL (ref 0.350–4.500)

## 2023-04-26 LAB — C DIFFICILE QUICK SCREEN W PCR REFLEX
C Diff antigen: NEGATIVE
C Diff interpretation: NOT DETECTED
C Diff toxin: NEGATIVE

## 2023-04-26 LAB — TROPONIN I (HIGH SENSITIVITY)
Troponin I (High Sensitivity): 20 ng/L — ABNORMAL HIGH (ref <18)
Troponin I (High Sensitivity): 21 ng/L — ABNORMAL HIGH (ref <18)

## 2023-04-26 LAB — MAGNESIUM
Magnesium: 1.3 mg/dL — ABNORMAL LOW (ref 1.7–2.4)
Magnesium: 2 mg/dL (ref 1.7–2.4)

## 2023-04-26 LAB — FOLATE: Folate: 15.2 ng/mL (ref >5.9)

## 2023-04-26 LAB — BRAIN NATRIURETIC PEPTIDE: B Natriuretic Peptide: 169.4 pg/mL — ABNORMAL HIGH (ref 0.0–100.0)

## 2023-04-26 LAB — FERRITIN: Ferritin: 42 ng/mL (ref 11–307)

## 2023-04-26 LAB — VITAMIN B12: Vitamin B-12: 537 pg/mL (ref 180–914)

## 2023-04-26 MED ORDER — FUROSEMIDE 10 MG/ML IJ SOLN
80.0000 mg | Freq: Two times a day (BID) | INTRAMUSCULAR | Status: DC
  Administered 2023-04-26 – 2023-04-28 (×5): 80 mg via INTRAVENOUS
  Filled 2023-04-26 (×6): qty 8

## 2023-04-26 MED ORDER — PANTOPRAZOLE SODIUM 40 MG IV SOLR
40.0000 mg | Freq: Two times a day (BID) | INTRAVENOUS | Status: DC
  Administered 2023-04-26 (×2): 40 mg via INTRAVENOUS
  Filled 2023-04-26 (×2): qty 10

## 2023-04-26 MED ORDER — MAGNESIUM SULFATE 4 GM/100ML IV SOLN
4.0000 g | Freq: Once | INTRAVENOUS | Status: AC
  Administered 2023-04-26: 4 g via INTRAVENOUS
  Filled 2023-04-26: qty 100

## 2023-04-26 MED ORDER — METOPROLOL TARTRATE 25 MG PO TABS
25.0000 mg | ORAL_TABLET | Freq: Three times a day (TID) | ORAL | Status: DC
  Administered 2023-04-26 – 2023-04-30 (×11): 25 mg via ORAL
  Filled 2023-04-26 (×12): qty 1

## 2023-04-26 MED ORDER — DILTIAZEM HCL 25 MG/5ML IV SOLN
10.0000 mg | Freq: Once | INTRAVENOUS | Status: AC
  Administered 2023-04-26: 10 mg via INTRAVENOUS
  Filled 2023-04-26: qty 5

## 2023-04-26 MED ORDER — MAGNESIUM SULFATE 2 GM/50ML IV SOLN
2.0000 g | Freq: Once | INTRAVENOUS | Status: AC
  Administered 2023-04-26: 2 g via INTRAVENOUS
  Filled 2023-04-26: qty 50

## 2023-04-26 MED ORDER — METOPROLOL TARTRATE 5 MG/5ML IV SOLN
5.0000 mg | Freq: Once | INTRAVENOUS | Status: AC
  Administered 2023-04-26: 5 mg via INTRAVENOUS
  Filled 2023-04-26: qty 5

## 2023-04-26 MED ORDER — DILTIAZEM HCL-DEXTROSE 125-5 MG/125ML-% IV SOLN (PREMIX)
5.0000 mg/h | INTRAVENOUS | Status: DC
  Administered 2023-04-26: 5 mg/h via INTRAVENOUS
  Administered 2023-04-26 – 2023-04-28 (×6): 15 mg/h via INTRAVENOUS
  Filled 2023-04-26 (×9): qty 125

## 2023-04-26 MED ORDER — POTASSIUM CHLORIDE 10 MEQ/100ML IV SOLN
10.0000 meq | INTRAVENOUS | Status: AC
  Administered 2023-04-26 (×4): 10 meq via INTRAVENOUS
  Filled 2023-04-26 (×4): qty 100

## 2023-04-26 MED ORDER — POTASSIUM CHLORIDE 20 MEQ PO PACK
40.0000 meq | PACK | ORAL | Status: AC
  Administered 2023-04-26 (×2): 40 meq via ORAL
  Filled 2023-04-26 (×2): qty 2

## 2023-04-26 NOTE — Significant Event (Signed)
 Patient went into A-fib with RVR.  On exam at bedside patient is not in distress.  Denies any chest pain but does complain of some palpitations. Blood pressure was 140/60 pulse 150 IRregular A-fib.  Temperature 98.6 respiration 18/min.  Chest x-ray shows some congestion.  Reviewed patient's medications labs and notes.  Reviewed recent 2D echo results. I ordered a bolus of Cardizem 10 mg IV.  Patient still remained in A-fib RVR.  Started Cardizem infusion.  Will check TSH troponin.  Midge Minium MD.

## 2023-04-26 NOTE — Progress Notes (Signed)
 PROGRESS NOTE    Alyssa Blanchard  WUJ:[id number] DOB: 1971-01-27 DOA: 04/23/2023 PCP: Lenox Ponds, MD    Chief Complaint  Patient presents with   Shortness of Breath    Brief Narrative:  Patient is a 53 year old, morbidly obese female with history of chronic HFpEF, PAF on Eliquis, asthma, type 2 diabetes, hypertension, depression/anxiety presented to the ED and admitted for acute on chronic HFpEF.  Cardiology consulted   Assessment & Plan:   Principal Problem:   Acute on chronic heart failure with preserved ejection fraction (HFpEF) (HCC) Active Problems:   Cellulitis of right leg   Paroxysmal atrial fibrillation (HCC)   Type 2 diabetes mellitus without complication, without long-term current use of insulin (HCC)   Asthma   Depression with anxiety   Hypertension   Fever   Diarrhea   UTI (urinary tract infection)   Hypomagnesemia   Hypokalemia   Atrial fibrillation with rapid ventricular response (HCC)  #1 acute on chronic HFpEF -Patient presented with progressive dyspnea on exertion, weight gain, peripheral edema. -BNP noted at 66.4. -Patient recently admitted at [facility] weight on discharge at the time was 179 kg on discharge weight on presentation noted at 200.9 kg. -Patient endorses dietary compliance as well as medication compliance. -Patient noted in the ED to have some low blood pressure readings however concern for accuracy likely limited due to body habitus. -Recent 2D echo at outside hospital on 03/10/2023 with a EF of 60 to 65%, moderate pulmonary hypertension. -Repeat 2D echo with EF of 70 to 75%,NWMA, normal right ventricular systolic function, mildly elevated pulmonary artery systolic pressure. -Patient initially on admission was placed on Lasix 40 mg IV every 12 hours which was increased to 60 mg every 12 hours per cardiology with a urine output of 700 cc recorded over the past 24 hours.  -Current weight of 193.3 kg from 200.9 kg on  admission. -Patient initially started on midodrine for BP support which has been discontinued, BP currently stable. -IV Lasix being increased to 80 mg IV every 12 hours per cardiology to start this evening. -Spironolactone resumed per cardiology.   -Strict I's and O's, daily weights. -Continue CPAP nightly if able to. -Cardiology following and appreciate their input and recommendations.    2.  Paroxysmal A-fib/A-fib with RVR -Patient noted initially to be in normal sinus rhythm on admission however went into A-fib with RVR overnight with heart rate sustained in the 150s to 180s.   -Patient placed on a Cardizem drip currently at 15 mg/h.  However heart rate still in the 150s.   -Lopressor 5 mg IV x 1.  -Noted to be on propranolol 80 mg 3 times daily which is currently on hold. -If no improvement with A-fib with RVR may need to place patient on amiodarone drip however will defer to cardiology. -Cardiology feels patient may likely need sa cardioversion which is planned for tomorrow, DCCV with no missed doses of Eliquis. -Eliquis for anticoagulation. -Cardiology following appreciate input and recommendations.  3.  Fever/?  UTI -Patient noted to have a temp of 101 (04/25/2023). -Patient with nausea and emesis the morning of 04/25/2023, noted to have watery diarrhea per RN. -Fever curve trending down, clinical improvement, C. difficile PCR negative.   -GI pathogen panel pending.   -Blood cultures with no growth x 1 day.   -Urinalysis with large leukocytes, nitrite negative, 6-10 WBCs, urine cultures pending. -Continue empiric IV Rocephin pending culture results.  4.  Hypokalemia/hypomagnesemia -Likely secondary to diuresis and  GI losses. -Potassium at 3.1, magnesium at 1.3. -K-Dur 40 mEq p.o. daily.   -Patient receiving KCl 10 mEq IV every hour x 4 runs today.   -Patient noted to already received magnesium sulfate 2 g IV x 1 earlier on this morning, will give another dose of magnesium sulfate  4 g IV x 1.   -Repeat labs in the AM.   5.  Right thigh cellulitis -Patient with erythema of the right thigh with some warmth, nontender to palpation which is improving after initiation of IV antibiotics. -Continue IV Rocephin.  6.  Diarrhea -Patient noted to have watery diarrhea the morning of 04/25/2023 with associated nausea and vomiting.   -Nausea and vomiting improved.   -No diarrhea this morning per patient.   -C. difficile PCR negative.   -GI pathogen panel pending.   -Supportive care.   7.  Hypertension -On admission patient noted to have intermittently low BPs however concern for accuracy given body habitus. -Antihypertensive medications on hold while patient on IV diuretics. -Patient initially started on midodrine which has been discontinued per cardiology. -BP currently stable.  8.  Asthma -Continue Breo Ellipta and Xopenex inhaler as needed.  9.  Well-controlled type 2 diabetes mellitus -Hemoglobin A1c 6.7% on 03/09/2023. -CBG 127 this morning. -SSI.  10.  Depression/anxiety -Stable. -Not on any medications per med rec. -Outpatient follow-up.  11.  Morbid obesity -BMI 73.7 kg/m -Patient may have underlying OSA/OHS. -Per admitting physician patient stated scheduled for sleep study in April. -Lifestyle modification. -Outpatient follow-up with PCP.   DVT prophylaxis: Eliquis Code Status: Full Family Communication: Updated patient.  No family at bedside. Disposition: Remain in progressive care.  TBD  Status is: Inpatient Remains inpatient appropriate because: Severity of illness   Consultants:  Cardiology: Dr. Jacques Navy 04/24/2023  Procedures:  Chest x-ray 04/23/2023 2D echo 04/24/2023  Antimicrobials:  Anti-infectives (From admission, onward)    Start     Dose/Rate Route Frequency Ordered Stop   04/25/23 1115  cefTRIAXone (ROCEPHIN) 2 g in sodium chloride 0.9 % 100 mL IVPB        2 g 200 mL/hr over 30 Minutes Intravenous Every 24 hours 04/25/23 1025            Subjective: Patient laying in bed.  Alert.  States she is in the hospital due to volume overload and CHF exacerbation.  States she is awake today and more alert and Compazine made her drowsy yesterday.  Denies any further nausea or vomiting today.  Denies any diarrhea today states she is just passing gas.  States right thigh pain has improved.  Denies any chest pain.  Still with significant shortness of breath.  Patient noted overnight to go into A-fib with RVR and placed on a Cardizem drip heart rates sustaining in the 150s.  Objective: Vitals:   04/26/23 0532 04/26/23 0615 04/26/23 0700 04/26/23 0808  BP: (!) 122/43 105/67    Pulse: (!) 131 (!) 169 (!) 170   Resp: 16 17    Temp:      TempSrc:      SpO2:  100%  96%  Weight: (!) 193.3 kg     Height:        Intake/Output Summary (Last 24 hours) at 04/26/2023 1007 Last data filed at 04/26/2023 0845 Gross per 24 hour  Intake 1988.79 ml  Output 401 ml  Net 1587.79 ml   Filed Weights   04/24/23 1648 04/25/23 0500 04/26/23 0532  Weight: (!) 199.3 kg (!) 195.8 kg Marland Kitchen)  193.3 kg    Examination:  General exam: Alert. Respiratory system: Scattered crackles.  Distant breath sounds due to body habitus.  No wheezing.  Fair air movement.  Speaking in full sentences.   Cardiovascular system: Distant heart sounds due to body habitus.  Irregularly irregular.  Unable to assess JVD due to neck thickness.  1-2+ bilateral lower extremity edema.  Gastrointestinal system: Abdomen is obese, nondistended, soft and nontender. No organomegaly or masses felt. Normal bowel sounds heard. Central nervous system: Drowsy. No focal neurological deficits. Extremities: Right thigh region with less erythema, less warmth, nontender to palpation.  Symmetric 5 x 5 power. Skin: No rashes, lesions or ulcers Psychiatry: Judgement and insight appear normal. Mood & affect appropriate.     Data Reviewed: I have personally reviewed following labs and imaging  studies  CBC: Recent Labs  Lab 04/23/23 2000 04/24/23 0624 04/25/23 0201 04/26/23 0204  WBC 10.9* 7.6 6.1 8.6  NEUTROABS  --   --   --  6.0  HGB 9.3* 8.8* 9.5* 8.8*  HCT 30.6* 29.9* 31.6* 29.2*  MCV 85.0 87.2 86.8 87.7  PLT 215 205 166 128*    Basic Metabolic Panel: Recent Labs  Lab 04/23/23 2000 04/24/23 0624 04/25/23 0201 04/26/23 0204  NA 137 139 138 136  K 3.1* 3.5 3.1* 3.1*  CL 98 100 99 93*  CO2 28 27 30 31   GLUCOSE 128* 141* 152* 127*  BUN 6 5* 7 7  CREATININE 0.84 0.81 0.89 0.83  CALCIUM 8.4* 8.3* 7.8* 7.8*  MG  --   --   --  1.3*    GFR: Estimated Creatinine Clearance: 139.6 mL/min (by C-G formula based on SCr of 0.83 mg/dL).  Liver Function Tests: Recent Labs  Lab 04/23/23 2000  AST 44*  ALT 28  ALKPHOS 88  BILITOT 1.1  PROT 6.4*  ALBUMIN 2.9*    CBG: Recent Labs  Lab 04/25/23 0603 04/25/23 1043 04/25/23 1607 04/25/23 2101 04/26/23 0605  GLUCAP 132* 187* 126* 156* 127*     Recent Results (from the past 240 hours)  Resp panel by RT-PCR (RSV, Flu A&B, Covid) Anterior Nasal Swab     Status: None   Collection Time: 04/23/23  5:57 PM   Specimen: Anterior Nasal Swab  Result Value Ref Range Status   SARS Coronavirus 2 by RT PCR NEGATIVE NEGATIVE Final   Influenza A by PCR NEGATIVE NEGATIVE Final   Influenza B by PCR NEGATIVE NEGATIVE Final    Comment: (NOTE) The Xpert Xpress SARS-CoV-2/FLU/RSV plus assay is intended as an aid in the diagnosis of influenza from Nasopharyngeal swab specimens and should not be used as a sole basis for treatment. Nasal washings and aspirates are unacceptable for Xpert Xpress SARS-CoV-2/FLU/RSV testing.  Fact Sheet for Patients: BloggerCourse.com  Fact Sheet for Healthcare Providers: SeriousBroker.it  This test is not yet approved or cleared by the [place] FDA and has been authorized for detection and/or diagnosis of SARS-CoV-2 by FDA under an  Emergency Use Authorization (EUA). This EUA will remain in effect (meaning this test can be used) for the duration of the COVID-19 declaration under Section 564(b)(1) of the Act, 21 U.S.C. section 360bbb-3(b)(1), unless the authorization is terminated or revoked.     Resp Syncytial Virus by PCR NEGATIVE NEGATIVE Final    Comment: (NOTE) Fact Sheet for Patients: BloggerCourse.com  Fact Sheet for Healthcare Providers: SeriousBroker.it  This test is not yet approved or cleared by the [place] and has been authorized for  detection and/or diagnosis of SARS-CoV-2 by FDA under an Emergency Use Authorization (EUA). This EUA will remain in effect (meaning this test can be used) for the duration of the COVID-19 declaration under Section 564(b)(1) of the Act, 21 U.S.C. section 360bbb-3(b)(1), unless the authorization is terminated or revoked.  Performed at [facility], [address] [place], [place] [address]   Culture, blood (Routine X 2) w Reflex to ID Panel     Status: None (Preliminary result)   Collection Time: 04/25/23  8:12 AM   Specimen: BLOOD RIGHT HAND  Result Value Ref Range Status   Specimen Description BLOOD RIGHT HAND  Final   Special Requests   Final    BOTTLES DRAWN AEROBIC AND ANAEROBIC Blood Culture results may not be optimal due to an inadequate volume of blood received in culture bottles   Culture   Final    NO GROWTH 1 DAY Performed at [facility], [address] [place], [place] [address]    Report Status PENDING  Incomplete  Culture, blood (Routine X 2) w Reflex to ID Panel     Status: None (Preliminary result)   Collection Time: 04/25/23  8:16 AM   Specimen: BLOOD RIGHT ARM  Result Value Ref Range Status   Specimen Description BLOOD RIGHT ARM  Final   Special Requests   Final    BOTTLES DRAWN AEROBIC AND ANAEROBIC Blood Culture results may not be optimal due to an inadequate volume of  blood received in culture bottles   Culture   Final    NO GROWTH 1 DAY Performed at [facility], [address], [place], [place] [address]    Report Status PENDING  Incomplete  C Difficile Quick Screen w PCR reflex     Status: None   Collection Time: 04/26/23  4:15 AM   Specimen: STOOL  Result Value Ref Range Status   C Diff antigen NEGATIVE NEGATIVE Final   C Diff toxin NEGATIVE NEGATIVE Final   C Diff interpretation No C. difficile detected.  Final    Comment: Performed at [facility], [address] [place], [place] [address]         Radiology Studies: DG Chest [place] 1 View Result Date: 04/26/2023 CLINICAL DATA:  Shortness of breath EXAM: PORTABLE CHEST 1 VIEW COMPARISON:  Film from the previous day. FINDINGS: Cardiac shadow is stable. Increasing vascular congestion is noted with mild edema consistent with congestive failure. No sizable effusion is seen. No bony abnormality is noted. IMPRESSION: Mild CHF. Electronically Signed   By: Alcide Clever M.D.   On: 04/26/2023 03:31   DG CHEST PORT 1 VIEW Result Date: 04/25/2023 CLINICAL DATA:  [id number] Fever [id number] EXAM: PORTABLE CHEST 1 VIEW COMPARISON:  April 23, 2023 FINDINGS: The cardiomediastinal silhouette is unchanged and enlarged in contour. No pleural effusion. No pneumothorax. Similar appearance of scattered bandlike opacities bilaterally. IMPRESSION: Similar appearance of scattered bandlike opacities bilaterally, most likely atelectasis. Electronically Signed   By: Meda Klinefelter M.D.   On: 04/25/2023 11:54   ECHOCARDIOGRAM COMPLETE Result Date: 04/24/2023    ECHOCARDIOGRAM REPORT   Patient Name:   Alyssa Blanchard Date of Exam: 04/24/2023 Medical Rec #:  [id number]         Height:       65.0 in Accession #:    [id number]        Weight:       442.9 lb Date of Birth:  29-Aug-1970  BSA:          2.773 m Patient Age:    52 years          BP:           116/58 mmHg Patient Gender: F                 HR:            99 bpm. Exam Location:  Inpatient Procedure: 2D Echo (Both Spectral and Color Flow Doppler were utilized during            procedure). Indications:    congestive heart failure  History:        Patient has prior history of Echocardiogram examinations, most                 recent 08/05/2019. Arrythmias:Atrial Fibrillation; Risk                 Factors:Diabetes.  Sonographer:    Delcie Roch RDCS Referring Phys: [id number] Lynda Rainwater A ACHARYA IMPRESSIONS  1. Left ventricular ejection fraction, by estimation, is 70 to 75%. The left ventricle has hyperdynamic function. The left ventricle has no regional wall motion abnormalities. There is mild left ventricular hypertrophy. Left ventricular diastolic parameters were normal (medial e' 9.7 cm/s).  2. Right ventricular systolic function is normal. The right ventricular size is normal. There is mildly elevated pulmonary artery systolic pressure. The estimated right ventricular systolic pressure is 38.5 mmHg.  3. The mitral valve is grossly normal. Trivial mitral valve regurgitation. No evidence of mitral stenosis.  4. The aortic valve was not well visualized. There is mild calcification of the aortic valve. Aortic valve regurgitation is not visualized. No aortic stenosis is present.  5. The inferior vena cava is dilated in size with >50% respiratory variability, suggesting right atrial pressure of 8 mmHg.  6. Increased flow velocities may be secondary to anemia, thyrotoxicosis, hyperdynamic or high flow state. FINDINGS  Left Ventricle: Left ventricular ejection fraction, by estimation, is 70 to 75%. The left ventricle has hyperdynamic function. The left ventricle has no regional wall motion abnormalities. The left ventricular internal cavity size was normal in size. There is mild left ventricular hypertrophy. Left ventricular diastolic parameters were normal. Right Ventricle: The right ventricular size is normal. No increase in right ventricular wall thickness. Right  ventricular systolic function is normal. There is mildly elevated pulmonary artery systolic pressure. The tricuspid regurgitant velocity is 2.76  m/s, and with an assumed right atrial pressure of 8 mmHg, the estimated right ventricular systolic pressure is 38.5 mmHg. Left Atrium: Left atrial size was normal in size. Right Atrium: Right atrial size was normal in size. Pericardium: There is no evidence of pericardial effusion. Mitral Valve: The mitral valve is grossly normal. Trivial mitral valve regurgitation. No evidence of mitral valve stenosis. Tricuspid Valve: The tricuspid valve is normal in structure. Tricuspid valve regurgitation is trivial. No evidence of tricuspid stenosis. Aortic Valve: The aortic valve was not well visualized. There is mild calcification of the aortic valve. Aortic valve regurgitation is not visualized. No aortic stenosis is present. Aortic valve mean gradient measures 15.0 mmHg. Aortic valve peak gradient measures 27.4 mmHg. Aortic valve area, by VTI measures 2.19 cm. Pulmonic Valve: The pulmonic valve was not well visualized. Pulmonic valve regurgitation is trivial. No evidence of pulmonic stenosis. Aorta: The aortic root is normal in size and structure. Venous: The inferior vena cava is dilated in size with greater than 50% respiratory variability,  suggesting right atrial pressure of 8 mmHg. IAS/Shunts: The interatrial septum was not well visualized.  LEFT VENTRICLE PLAX 2D LVIDd:         4.90 cm   Diastology LVIDs:         3.40 cm   LV e' medial:    9.68 cm/s LV PW:         1.20 cm   LV E/e' medial:  14.4 LV IVS:        1.10 cm   LV e' lateral:   15.00 cm/s LVOT diam:     1.80 cm   LV E/e' lateral: 9.3 LV SV:         91 LV SV Index:   33 LVOT Area:     2.54 cm  RIGHT VENTRICLE             IVC RV Basal diam:  2.80 cm     IVC diam: 2.20 cm RV S prime:     17.50 cm/s TAPSE (M-mode): 3.0 cm LEFT ATRIUM             Index        RIGHT ATRIUM           Index LA diam:        4.20 cm 1.51  cm/m   RA Area:     15.50 cm LA Vol (A2C):   42.7 ml 15.40 ml/m  RA Volume:   37.40 ml  13.49 ml/m LA Vol (A4C):   70.8 ml 25.53 ml/m LA Biplane Vol: 56.2 ml 20.26 ml/m  AORTIC VALVE AV Area (Vmax):    2.05 cm AV Area (Vmean):   2.12 cm AV Area (VTI):     2.19 cm AV Vmax:           261.50 cm/s AV Vmean:          181.000 cm/s AV VTI:            0.417 m AV Peak Grad:      27.4 mmHg AV Mean Grad:      15.0 mmHg LVOT Vmax:         211.00 cm/s LVOT Vmean:        150.500 cm/s LVOT VTI:          0.359 m LVOT/AV VTI ratio: 0.86  AORTA Ao Root diam: 3.00 cm Ao Asc diam:  3.70 cm MITRAL VALVE                TRICUSPID VALVE MV Area (PHT): 4.60 cm     TR Peak grad:   30.5 mmHg MV Decel Time: 165 msec     TR Vmax:        276.00 cm/s MV E velocity: 139.00 cm/s MV A velocity: 117.00 cm/s  SHUNTS MV E/A ratio:  1.19         Systemic VTI:  0.36 m                             Systemic Diam: 1.80 cm Weston Brass MD Electronically signed by Weston Brass MD Signature Date/Time: 04/24/2023/6:20:08 PM    Final         Scheduled Meds:  apixaban  5 mg Oral BID   diclofenac Sodium  2 g Topical BID   furosemide  80 mg Intravenous BID   insulin aspart  0-5 Units Subcutaneous QHS   insulin aspart  0-9 Units Subcutaneous TID WC  iron polysaccharides  150 mg Oral Daily   magnesium oxide  400 mg Oral Daily   melatonin  10 mg Oral QHS   mometasone-formoterol  1 puff Inhalation Daily   pantoprazole (PROTONIX) IV  40 mg Intravenous Q12H   potassium chloride  40 mEq Oral Q4H   potassium chloride  40 mEq Oral Daily   pregabalin  200 mg Oral BID   sodium chloride flush  3 mL Intravenous Q12H   spironolactone  25 mg Oral Daily   Continuous Infusions:  cefTRIAXone (ROCEPHIN)  IV Stopped (04/25/23 1407)   diltiazem (CARDIZEM) infusion 15 mg/hr (04/26/23 0527)   magnesium sulfate bolus IVPB 4 g (04/26/23 0830)   potassium chloride       LOS: 3 days    Time spent: 40 minutes    Ramiro Harvest, MD [facility]   To contact the attending provider between 7A-7P or the covering provider during after hours 7P-7A, please log into the web site [email or web address] and access using universal [place] password for that web site. If you do not have the password, please call the hospital operator.  04/26/2023, 10:07 AM

## 2023-04-26 NOTE — Plan of Care (Signed)
  Problem: Metabolic: Goal: Ability to maintain appropriate glucose levels will improve Outcome: Progressing   Problem: Education: Goal: Knowledge of General Education information will improve Description: Including pain rating scale, medication(s)/side effects and non-pharmacologic comfort measures Outcome: Progressing   Problem: Safety: Goal: Ability to remain free from injury will improve Outcome: Progressing   Problem: Cardiac: Goal: Ability to achieve and maintain adequate cardiopulmonary perfusion will improve Outcome: Progressing

## 2023-04-26 NOTE — Progress Notes (Signed)
 Patient Name: Alyssa Blanchard Date of Encounter: 04/26/2023 [facility] Cardiologist: Chilton Si, MD   Interval Summary  .    Overnight events: pt went into afib with RVR. Hx of afib and she is anticoagulated. No missed doses. Will consider DCCV tomorrow but today will utilize diltiazem and amiodarone.   53 year old female with probable mixed high-output heart failure and right-sided heart failure due to untreated OSA/OHS with pulmonary hypertension by outside right heart cath presenting with quick turnaround readmission for recurrent heart failure.  I personally reviewed her echocardiogram demonstrating normal biventricular size and function with normal diastolic function.  Elevated flow velocities across all valves, elevated RVSP 39 mmHg.  Findings most consistent with high-output heart failure, right heart cath performed outside did not clearly document any step up or shunt physiology.  Patient is much more alert today. Yesterday, we discussed weight loss as management strategy for high-output heart failure, and treatment of OSA which she will need to reconsider CPAP, possibly BiPAP depending on titration.  She was recently admitted to the outside hospital for very similar symptoms, attributed to diastolic heart failure though diastolic function is normal by echo.  Vital Signs .    Vitals:   04/26/23 0532 04/26/23 0615 04/26/23 0700 04/26/23 0808  BP: (!) 122/43 105/67    Pulse: (!) 131 (!) 169 (!) 170   Resp: 16 17    Temp:      TempSrc:      SpO2:  100%  96%  Weight: (!) 193.3 kg     Height:        Intake/Output Summary (Last 24 hours) at 04/26/2023 0834 Last data filed at 04/25/2023 1830 Gross per 24 hour  Intake 2228.79 ml  Output 1101 ml  Net 1127.79 ml      04/26/2023    5:32 AM 04/25/2023    5:00 AM 04/24/2023    4:48 PM  Last 3 Weights  Weight (lbs) 426 lb 2.4 oz 431 lb 10.6 oz 439 lb 6 oz  Weight (kg) 193.3 kg 195.8 kg 199.3 kg       Telemetry/ECG    Afib RVR rate 145 Personally Reviewed  Physical Exam .   GEN: No acute distress.   Neck: No JVD Cardiac: RRR, no murmurs, rubs, or gallops.  Respiratory: Clear to auscultation bilaterally. GI: Soft, nontender, non-distended  MS: 3++ diffuse edema  Assessment & Plan .     High-output heart failure Pulmonary hypertension  Untreated OSA Obesity hypoventilation syndrome -Mixed heart failure type, likely predominantly high-output heart failure with component of right-sided heart failure from untreated OSA and OHS, with pulmonary hypertension on outside cath, PA mean 49 mmHg, wedge 34 mmHg.  Appears to have had a simultaneous left and right heart cath at the outside facility with direct LV pressure, no mention in the report of constrictive physiology. -Will continue diuretic, doing reasonably well on current dose of Lasix 60 mg IV twice daily but output less yesterday and she feels that her diuresis is less significant this time.  Would like to increase lasix to 80 mg IV BID today, but will await lyte repletion as she is otherwise stable. Will order for tonight.  -Heart failure therapy currently includes spironolactone 25 mg daily blood pressure challenging to measure but has been grossly normal to low.  Initially prescribed midodrine, but unless symptomatic likely does not need right now.  Hypokalemia Hypomag -Likely secondary to diuresis and diarrhea, primary service is replating aggressively, agree.  Paroxysmal atrial  fibrillation - back in afib today.  -Previously undergone TEE cardioversion in 2021, maintaining sinus rhythm until last night when she converted to Afib RVR.  At 1 point was taking dofetilide but eventually stopped and did not want to resume.  Felt to not be a candidate for ablation at this time given body habitus and untreated OSA.  She continues on Eliquis 5 mg twice daily with no missed doses.  - likely needs cardioversion planned for tomorrow, DCCV  with no missed doses of eliquis.   Cellulitis -Warm red lower extremities, primary team is addressing cellulitis.   For questions or updates, please contact [facility] Please consult [email or web address] for contact info under        Signed, Parke Poisson, MD

## 2023-04-27 DIAGNOSIS — I5083 High output heart failure: Secondary | ICD-10-CM

## 2023-04-27 DIAGNOSIS — J45909 Unspecified asthma, uncomplicated: Secondary | ICD-10-CM | POA: Diagnosis not present

## 2023-04-27 DIAGNOSIS — E662 Morbid (severe) obesity with alveolar hypoventilation: Secondary | ICD-10-CM

## 2023-04-27 DIAGNOSIS — A0811 Acute gastroenteropathy due to Norwalk agent: Secondary | ICD-10-CM

## 2023-04-27 DIAGNOSIS — I480 Paroxysmal atrial fibrillation: Secondary | ICD-10-CM | POA: Diagnosis not present

## 2023-04-27 DIAGNOSIS — R509 Fever, unspecified: Secondary | ICD-10-CM | POA: Diagnosis not present

## 2023-04-27 DIAGNOSIS — I5033 Acute on chronic diastolic (congestive) heart failure: Secondary | ICD-10-CM | POA: Diagnosis not present

## 2023-04-27 LAB — CBC WITH DIFFERENTIAL/PLATELET
Abs Immature Granulocytes: 0.05 10*3/uL (ref 0.00–0.07)
Basophils Absolute: 0 10*3/uL (ref 0.0–0.1)
Basophils Relative: 0 %
Eosinophils Absolute: 0.1 10*3/uL (ref 0.0–0.5)
Eosinophils Relative: 2 %
HCT: 30.3 % — ABNORMAL LOW (ref 36.0–46.0)
Hemoglobin: 9.1 g/dL — ABNORMAL LOW (ref 12.0–15.0)
Immature Granulocytes: 1 %
Lymphocytes Relative: 19 %
Lymphs Abs: 1.5 10*3/uL (ref 0.7–4.0)
MCH: 26.2 pg (ref 26.0–34.0)
MCHC: 30 g/dL (ref 30.0–36.0)
MCV: 87.3 fL (ref 80.0–100.0)
Monocytes Absolute: 1 10*3/uL (ref 0.1–1.0)
Monocytes Relative: 13 %
Neutro Abs: 5 10*3/uL (ref 1.7–7.7)
Neutrophils Relative %: 65 %
Platelets: 141 10*3/uL — ABNORMAL LOW (ref 150–400)
RBC: 3.47 MIL/uL — ABNORMAL LOW (ref 3.87–5.11)
RDW: 17.3 % — ABNORMAL HIGH (ref 11.5–15.5)
WBC: 7.6 10*3/uL (ref 4.0–10.5)
nRBC: 0 % (ref 0.0–0.2)

## 2023-04-27 LAB — GASTROINTESTINAL PANEL BY PCR, STOOL (REPLACES STOOL CULTURE)

## 2023-04-27 LAB — RENAL FUNCTION PANEL
Albumin: 2.5 g/dL — ABNORMAL LOW (ref 3.5–5.0)
Anion gap: 11 (ref 5–15)
BUN: 8 mg/dL (ref 6–20)
CO2: 31 mmol/L (ref 22–32)
Calcium: 7.5 mg/dL — ABNORMAL LOW (ref 8.9–10.3)
Chloride: 92 mmol/L — ABNORMAL LOW (ref 98–111)
Creatinine, Ser: 0.75 mg/dL (ref 0.44–1.00)
GFR, Estimated: >60 mL/min (ref >60)
Glucose, Bld: 133 mg/dL — ABNORMAL HIGH (ref 70–99)
Phosphorus: 2.8 mg/dL (ref 2.5–4.6)
Potassium: 3.4 mmol/L — ABNORMAL LOW (ref 3.5–5.1)
Sodium: 134 mmol/L — ABNORMAL LOW (ref 135–145)

## 2023-04-27 LAB — GLUCOSE, CAPILLARY
Glucose-Capillary: 131 mg/dL — ABNORMAL HIGH (ref 70–99)
Glucose-Capillary: 167 mg/dL — ABNORMAL HIGH (ref 70–99)
Glucose-Capillary: 139 mg/dL — ABNORMAL HIGH (ref 70–99)
Glucose-Capillary: 161 mg/dL — ABNORMAL HIGH (ref 70–99)

## 2023-04-27 LAB — URINE CULTURE: Culture: >=100000 — AB

## 2023-04-27 LAB — MAGNESIUM: Magnesium: 1.7 mg/dL (ref 1.7–2.4)

## 2023-04-27 LAB — CORTISOL: Cortisol, Plasma: 5.8 ug/dL

## 2023-04-27 MED ORDER — SODIUM CHLORIDE 0.9% FLUSH
3.0000 mL | Freq: Two times a day (BID) | INTRAVENOUS | Status: DC
  Administered 2023-04-27 (×2): 3 mL via INTRAVENOUS

## 2023-04-27 MED ORDER — SODIUM CHLORIDE 0.9% FLUSH: 3.0000 mL | INTRAVENOUS | Status: DC | PRN

## 2023-04-27 MED ORDER — PANTOPRAZOLE SODIUM 40 MG PO TBEC
40.0000 mg | DELAYED_RELEASE_TABLET | Freq: Two times a day (BID) | ORAL | Status: DC
  Administered 2023-04-27 – 2023-04-30 (×7): 40 mg via ORAL
  Filled 2023-04-27 (×7): qty 1

## 2023-04-27 MED ORDER — MAGNESIUM SULFATE 2 GM/50ML IV SOLN
2.0000 g | Freq: Once | INTRAVENOUS | Status: AC
  Administered 2023-04-27: 2 g via INTRAVENOUS
  Filled 2023-04-27: qty 50

## 2023-04-27 NOTE — TOC Initial Note (Addendum)
 Transition of Care [facility]) - Initial/Assessment Note    Patient Details  Name: Alyssa Blanchard MRN: [id number] Date of Birth: 12-31-1970  [facility]) CM/SW Contact:    Marliss Coots, LCSW Phone Number: 04/27/2023, 12:54 PM  Clinical Narrative:                  12:54 PM CSW added SDOH resources (transportation) to AVS. Patient may need ambulance transportation for discharge.    Barriers to Discharge: Continued Medical Work up   Patient Goals and CMS Choice            Expected Discharge Plan and Services       Living arrangements for the past 2 months: Single Family Home                                      Prior Living Arrangements/Services Living arrangements for the past 2 months: Single Family Home Lives with:: Parents Patient language and need for interpreter reviewed:: Yes              Criminal Activity/Legal Involvement Pertinent to Current Situation/Hospitalization: No - Comment as needed  Activities of Daily Living   ADL Screening (condition at time of admission) Independently performs ADLs?: No Does the patient have a NEW difficulty with bathing/dressing/toileting/self-feeding that is expected to last >3 days?: No (needs assist) Does the patient have a NEW difficulty with getting in/out of bed, walking, or climbing stairs that is expected to last >3 days?: No (needs assist) Does the patient have a NEW difficulty with communication that is expected to last >3 days?: No Is the patient deaf or have difficulty hearing?: No Does the patient have difficulty seeing, even when wearing glasses/contacts?: No Does the patient have difficulty concentrating, remembering, or making decisions?: No  Permission Sought/Granted Permission sought to share information with : [employer] Permission granted to share information with : No (Contact information on chart)  Share Information with NAME: Manning Charity Cowing     Permission granted to share  info w Relationship: Mother  Permission granted to share info w Contact Information: [phone]  Emotional Assessment       Orientation: : Oriented to Self, Oriented to Place, Oriented to  Time, Oriented to Situation Alcohol / Substance Use: Not Applicable Psych Involvement: No (comment)  Admission diagnosis:  SOB (shortness of breath) [R06.02] Hypervolemia, unspecified hypervolemia type [E87.70] Acute on chronic heart failure with preserved ejection fraction (HFpEF) (HCC) [I50.33] Patient Active Problem List   Diagnosis Date Noted   Gastroenteritis due to norovirus 04/27/2023   UTI (urinary tract infection) 04/26/2023   Hypomagnesemia 04/26/2023   Hypokalemia 04/26/2023   Atrial fibrillation with rapid ventricular response (HCC) 04/26/2023   Fever 04/25/2023   Diarrhea 04/25/2023   Asthma 04/24/2023   Depression with anxiety 04/24/2023   Hypertension 04/24/2023   Acute on chronic heart failure with preserved ejection fraction (HFpEF) (HCC) 04/23/2023   Rectal bleeding 10/14/2022   Anxiety 06/30/2022   Debility 02/28/2022   Prolonged QT interval 02/17/2022   Cellulitis of right leg 02/16/2022   Lumbar radiculopathy, right 02/16/2022   Anemia 02/16/2022   Iron deficiency anemia 02/02/2022   Type 2 diabetes mellitus without complication, without long-term current use of insulin (HCC) 12/25/2021   Chronic combined systolic and diastolic congestive heart failure (HCC) 09/25/2019   Acute pulmonary edema (HCC)    Paroxysmal atrial fibrillation (HCC) 08/02/2019  Bipolar disorder, in full remission, most recent episode manic (HCC) 04/15/2019   Psychosis (HCC) 04/14/2019   Not well controlled moderate persistent asthma 02/02/2018   Chronic rhinitis 02/02/2018   Status asthmaticus 11/29/2017   Acute respiratory failure with hypoxia (HCC) 11/29/2017   Gastroesophageal reflux disease 09/01/2017   Cough 12/31/2016   Dysuria 07/28/2016   Clinical infection 07/28/2016   History  of asthma 04/17/2016   Acute bronchitis 04/17/2016   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   Migraine without aura and without status migrainosus, not intractable 10/10/2014   Migraine with aura and without status migrainosus, not intractable 08/29/2014   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   Snorings 07/10/2014   Sleep related headaches 07/10/2014   Nocturia more than twice per night 07/10/2014   Preventative health care 07/30/2012   Morbid obesity (HCC) 07/30/2012   PCP:  Lenox Ponds, MD Pharmacy:   [id number] [place], [place] - [address] [place] [address] Phone: [phone] Fax: [phone]     Social Drivers of Health (SDOH) Social History: SDOH Screenings   Food Insecurity: No Food Insecurity (04/24/2023)  Housing: Low Risk  (04/24/2023)  Transportation Needs: Unmet Transportation Needs (04/24/2023)  Utilities: Not At Risk (04/24/2023)  Alcohol Screen: Low Risk  (11/24/2022)  Financial Resource Strain: Patient Declined (11/24/2022)  Physical Activity: Inactive (01/29/2023)   Received from [facility]  Social Connections: Socially Isolated (11/24/2022)  Stress: Stress Concern Present (11/24/2022)  Tobacco Use: Medium Risk (04/24/2023)   SDOH Interventions:     Readmission Risk Interventions    02/28/2022   11:05 AM  Readmission Risk Prevention Plan  Transportation Screening Complete  PCP or Specialist Appt within 5-7 Days Complete  Home Care Screening Complete  Medication Review (RN CM) Complete

## 2023-04-27 NOTE — Plan of Care (Signed)
  Problem: Education: Goal: Ability to describe self-care measures that may prevent or decrease complications (Diabetes Survival Skills Education) will improve Outcome: Progressing   Problem: Coping: Goal: Ability to adjust to condition or change in health will improve Outcome: Progressing   Problem: Fluid Volume: Goal: Ability to maintain a balanced intake and output will improve Outcome: Progressing   Problem: Metabolic: Goal: Ability to maintain appropriate glucose levels will improve Outcome: Progressing   Problem: Skin Integrity: Goal: Risk for impaired skin integrity will decrease Outcome: Progressing   Problem: Tissue Perfusion: Goal: Adequacy of tissue perfusion will improve Outcome: Progressing   

## 2023-04-27 NOTE — Plan of Care (Signed)
  Problem: Fluid Volume: Goal: Ability to maintain a balanced intake and output will improve Outcome: Progressing   Problem: Nutritional: Goal: Maintenance of adequate nutrition will improve Outcome: Progressing   Problem: Education: Goal: Knowledge of General Education information will improve Description: Including pain rating scale, medication(s)/side effects and non-pharmacologic comfort measures Outcome: Progressing   Problem: Clinical Measurements: Goal: Will remain free from infection Outcome: Progressing

## 2023-04-27 NOTE — Discharge Instructions (Addendum)
 Information on my medicine - ELIQUIS (apixaban)  This medication education was reviewed with me or my healthcare representative as part of my discharge preparation.    Why was Eliquis prescribed for you? Eliquis was prescribed for you to reduce the risk of a blood clot forming that can cause a stroke if you have a medical condition called atrial fibrillation (a type of irregular heartbeat).  What do You need to know about Eliquis ? Take your Eliquis TWICE DAILY - one tablet in the morning and one tablet in the evening with or without food. If you have difficulty swallowing the tablet whole please discuss with your pharmacist how to take the medication safely.  Take Eliquis exactly as prescribed by your doctor and DO NOT stop taking Eliquis without talking to the doctor who prescribed the medication.  Stopping may increase your risk of developing a stroke.  Refill your prescription before you run out.  After discharge, you should have regular check-up appointments with your healthcare provider that is prescribing your Eliquis.  In the future your dose may need to be changed if your kidney function or weight changes by a significant amount or as you get older.  What do you do if you miss a dose? If you miss a dose, take it as soon as you remember on the same day and resume taking twice daily.  Do not take more than one dose of ELIQUIS at the same time to make up a missed dose.  Important Safety Information A possible side effect of Eliquis is bleeding. You should call your healthcare provider right away if you experience any of the following: Bleeding from an injury or your nose that does not stop. Unusual colored urine (red or dark brown) or unusual colored stools (red or black). Unusual bruising for unknown reasons. A serious fall or if you hit your head (even if there is no bleeding).  Some medicines may interact with Eliquis and might increase your risk of bleeding or clotting  while on Eliquis. To help avoid this, consult your healthcare provider or pharmacist prior to using any new prescription or non-prescription medications, including herbals, vitamins, non-steroidal anti-inflammatory drugs (NSAIDs) and supplements.  This website has more information on Eliquis (apixaban): http://www.eliquis.com/eliquis/home   Medicaid Transportation:  If you have a Medicaid "blue card" or "pink card" and have no other means for transportation to [employer], clinics, dentists, hospitals, and other health related trip needs.  -Transportation services are available to all [place] locations. Trips to [facility] and [place] are provided in association with [facility]. -Services are provided between 6:00AM and 9:00PM Monday-Friday. -Call [phone] to schedule a trip or request further information.

## 2023-04-27 NOTE — Progress Notes (Signed)
PHARMACIST - PHYSICIAN COMMUNICATION ? ?DR:   Janee Morn ? ?CONCERNING: IV to Oral Route Change Policy ? ?RECOMMENDATION: ?This patient is receiving protonix by the intravenous route.  Based on criteria approved by the Pharmacy and Therapeutics Committee, the intravenous medication(s) is/are being converted to the equivalent oral dose form(s). ? ? ?DESCRIPTION: ?These criteria include: ?The patient is eating (either orally or via tube) and/or has been taking other orally administered medications for a least 24 hours ?The patient has no evidence of active gastrointestinal bleeding or impaired GI absorption (gastrectomy, short bowel, patient on TNA or NPO). ? ?If you have questions about this conversion, please contact the Pharmacy Department  ?[]   [phone] )  [facility] ?[]   [phone] )  [facility] ?[x]   [phone] )  Demit[facility] Mesecher ?[]   [phone] )  [facility] ?[]   [phone] )  [facility]  ? ? ? ? ?

## 2023-04-27 NOTE — Progress Notes (Signed)
 PROGRESS NOTE    Alyssa Blanchard  ZOX:[id number] DOB: [birth year] DOA: 04/23/2023 PCP: Lenox Ponds, MD    Chief Complaint  Patient presents with   Shortness of Breath    Brief Narrative:  Patient is a 53 year old, morbidly obese female with history of chronic HFpEF, PAF on Eliquis, asthma, type 2 diabetes, hypertension, depression/anxiety presented to the ED and admitted for acute on chronic HFpEF.  Cardiology consulted   Assessment & Plan:   Principal Problem:   Acute on chronic heart failure with preserved ejection fraction (HFpEF) (HCC) Active Problems:   Cellulitis of right leg   Paroxysmal atrial fibrillation (HCC)   Type 2 diabetes mellitus without complication, without long-term current use of insulin (HCC)   Asthma   Depression with anxiety   Hypertension   Fever   Diarrhea   UTI (urinary tract infection)   Hypomagnesemia   Hypokalemia   Atrial fibrillation with rapid ventricular response (HCC)   Gastroenteritis due to norovirus  #1 acute on chronic HFpEF -Patient presented with progressive dyspnea on exertion, weight gain, peripheral edema. -BNP noted at 66.4 >>> 169.4. -Patient recently admitted at [facility] weight on discharge at the time was 179 kg on discharge weight on presentation noted at 200.9 kg. -Patient endorses dietary compliance as well as medication compliance. -Patient noted in the ED to have some low blood pressure readings however concern for accuracy likely limited due to body habitus. -Recent 2D echo at outside hospital on 03/10/2023 with a EF of 60 to 65%, moderate pulmonary hypertension. -Repeat 2D echo with EF of 70 to 75%,NWMA, normal right ventricular systolic function, mildly elevated pulmonary artery systolic pressure. -Patient initially on admission was placed on Lasix 40 mg IV every 12 hours which was increased to 60 mg every 12 hours per cardiology and subsequently increased to 80 mg IV every 12 hours with a urine output of 4  L recorded over the past 24 hours.  -Current weight of 191.8 kg from 200.9 kg on admission. -Patient initially started on midodrine for BP support which has been discontinued, BP currently stable. -Spironolactone resumed per cardiology.   -Strict I's and O's, daily weights. -Continue CPAP nightly if able to. -Cardiology following and appreciate their input and recommendations.    2.  Paroxysmal A-fib/A-fib with RVR -Patient noted initially to be in normal sinus rhythm on admission however went into A-fib with RVR overnight with heart rate sustained in the 150s to 180s.   -Patient placed on a Cardizem drip currently at 15 mg/h.  However heart rate still in the 130-150s.   -Status post Lopressor 5 mg IV x 1.  -Noted to be on propranolol 80 mg 3 times daily which is currently on hold. -Patient started on Lopressor tartrate 25 mg p.o. 3 times daily per cardiology on 04/26/2023. -If no improvement with A-fib with RVR may need to place patient on amiodarone drip however will defer to cardiology. -Cardiology feels patient may likely need cardioversion which is planned for tomorrow, DCCV with no missed doses of Eliquis. -Continue Eliquis for anticoagulation. -Cardiology following appreciate input and recommendations.  3.  Fever/E. coli UTI -Patient noted to have a temp of 101 (04/25/2023). -Patient with nausea and emesis the morning of 04/25/2023, noted to have watery diarrhea per RN. -Fever curve trending down, clinical improvement, C. difficile PCR negative.   -GI pathogen panel positive for norovirus.   -Blood cultures with no growth x 2 day.   -Urinalysis with large leukocytes, nitrite negative, 6-10  WBCs, urine cultures with > 100,000 colonies of E. coli with sensitivities pending.  -Continue empiric IV Rocephin pending finalization of culture results.  4.  Hypokalemia/hypomagnesemia -Likely secondary to diuresis and GI losses. -Potassium at 3.4, magnesium at 1.7. -Continue K-Dur 40 mEq p.o.  daily.   -Magnesium sulfate 2 g IV x 1. -Repeat labs in AM.  5.  Right thigh cellulitis -Patient with findings concerning for cellulitis which have been improving since initiation of IV antibiotics.   -Continue IV Rocephin.   6.  Gastroenteritis secondary to norovirus /diarrhea -Patient noted to have watery diarrhea the morning of 04/25/2023 with associated nausea and vomiting.   -Nausea and vomiting improved.   -Clinical improvement with no further diarrhea no further nausea or vomiting. -C. difficile PCR negative. -GI pathogen panel positive for norovirus. -Supportive care.  7.  Hypertension -On admission patient noted to have intermittently low BPs however concern for accuracy given body habitus. -Antihypertensive medications on hold while patient on IV diuretics. -Patient initially started on midodrine which has been discontinued per cardiology. -BP currently stable.  8.  Asthma -Continue Breo Ellipta and Xopenex inhaler as needed.  9.  Well-controlled type 2 diabetes mellitus -Hemoglobin A1c 6.7% on 03/09/2023. -CBG 131 this morning. -SSI.  10.  Depression/anxiety -Stable. -Not on any medications per med rec. -Outpatient follow-up.  11.  Morbid obesity -BMI 73.7 kg/m -Patient may have underlying OSA/OHS. -Per admitting physician patient stated scheduled for sleep study in April. -Lifestyle modification. -Outpatient follow-up with PCP.   DVT prophylaxis: Eliquis Code Status: Full Family Communication: Updated patient.  No family at bedside. Disposition: Remain in progressive care.  TBD  Status is: Inpatient Remains inpatient appropriate because: Severity of illness   Consultants:  Cardiology: Dr. Jacques Navy 04/24/2023  Procedures:  Chest x-ray 04/23/2023 2D echo 04/24/2023  Antimicrobials:  Anti-infectives (From admission, onward)    Start     Dose/Rate Route Frequency Ordered Stop   04/25/23 1115  cefTRIAXone (ROCEPHIN) 2 g in sodium chloride 0.9 % 100 mL  IVPB        2 g 200 mL/hr over 30 Minutes Intravenous Every 24 hours 04/25/23 1025           Subjective: Patient laying in bed, alert and oriented to self place and time.  States she is feeling significantly better today.  Denies any further nausea or vomiting.  Denies any further diarrhea.  Feels her right leg pain has improved.  Still with complaints of palpitation and shortness of breath and still feels volume overloaded.  Stated saw cardiologist this morning and cardioversion has been postponed till tomorrow as patient was eating yogurt.  Heart rate on the monitor still ranging in the 130s to 150s.   Objective: Vitals:   04/27/23 0422 04/27/23 0737 04/27/23 0739 04/27/23 0918  BP: 120/63 113/66  113/66  Pulse: (!) 126 (!) 138  (!) 138  Resp: 12 19    Temp:      TempSrc: Oral Oral    SpO2: 99% 97% 97%   Weight: (!) 191.8 kg     Height:        Intake/Output Summary (Last 24 hours) at 04/27/2023 1055 Last data filed at 04/27/2023 0737 Gross per 24 hour  Intake 2015.18 ml  Output 2600 ml  Net -584.82 ml   Filed Weights   04/25/23 0500 04/26/23 0532 04/27/23 0422  Weight: (!) 195.8 kg (!) 193.3 kg (!) 191.8 kg    Examination:  General exam: Alert. Respiratory system: Diffuse scattered  crackles.  Distant breath sounds due to body habitus.  No wheezing.  Fair air movement.  Speaking in full sentences.   Cardiovascular system: Distant heart sounds.  Irregularly irregular.  Unable to assess JVD due to body habitus.  1-2+ bilateral lower extremity edema.  Gastrointestinal system: Abdomen is obese, nondistended, soft, nontender to palpation.  Positive bowel sounds.  No rebound.  No guarding.  Central nervous system: Alert and oriented to self place and time.  Moving extremities spontaneously.  No focal neurological deficits.  Extremities: Right thigh region with significantly improved erythema, improved warmth, nontender to palpation. Symmetric 5 x 5 power. Skin: No rashes,  lesions or ulcers Psychiatry: Judgement and insight appear normal. Mood & affect appropriate.     Data Reviewed: I have personally reviewed following labs and imaging studies  CBC: Recent Labs  Lab 04/23/23 2000 04/24/23 0624 04/25/23 0201 04/26/23 0204 04/27/23 0213  WBC 10.9* 7.6 6.1 8.6 7.6  NEUTROABS  --   --   --  6.0 5.0  HGB 9.3* 8.8* 9.5* 8.8* 9.1*  HCT 30.6* 29.9* 31.6* 29.2* 30.3*  MCV 85.0 87.2 86.8 87.7 87.3  PLT 215 205 166 128* 141*    Basic Metabolic Panel: Recent Labs  Lab 04/24/23 0624 04/25/23 0201 04/26/23 0204 04/26/23 1751 04/27/23 0213  NA 139 138 136 134* 134*  K 3.5 3.1* 3.1* 4.0 3.4*  CL 100 99 93* 96* 92*  CO2 27 30 31 27 31   GLUCOSE 141* 152* 127* 170* 133*  BUN 5* 7 7 8 8   CREATININE 0.81 0.89 0.83 0.84 0.75  CALCIUM 8.3* 7.8* 7.8* 7.7* 7.5*  MG  --   --  1.3* 2.0 1.7  PHOS  --   --   --   --  2.8    GFR: Estimated Creatinine Clearance: 144 mL/min (by C-G formula based on SCr of 0.75 mg/dL).  Liver Function Tests: Recent Labs  Lab 04/23/23 2000 04/27/23 0213  AST 44*  --   ALT 28  --   ALKPHOS 88  --   BILITOT 1.1  --   PROT 6.4*  --   ALBUMIN 2.9* 2.5*    CBG: Recent Labs  Lab 04/26/23 0605 04/26/23 1135 04/26/23 1629 04/26/23 2107 04/27/23 0614  GLUCAP 127* 164* 140* 137* 131*     Recent Results (from the past 240 hours)  Resp panel by RT-PCR (RSV, Flu A&B, Covid) Anterior Nasal Swab     Status: None   Collection Time: 04/23/23  5:57 PM   Specimen: Anterior Nasal Swab  Result Value Ref Range Status   SARS Coronavirus 2 by RT PCR NEGATIVE NEGATIVE Final   Influenza A by PCR NEGATIVE NEGATIVE Final   Influenza B by PCR NEGATIVE NEGATIVE Final    Comment: (NOTE) The Xpert Xpress SARS-CoV-2/FLU/RSV plus assay is intended as an aid in the diagnosis of influenza from Nasopharyngeal swab specimens and should not be used as a sole basis for treatment. Nasal washings and aspirates are unacceptable for Xpert Xpress  SARS-CoV-2/FLU/RSV testing.  Fact Sheet for Patients: BloggerCourse.com  Fact Sheet for Healthcare Providers: SeriousBroker.it  This test is not yet approved or cleared by the [place] FDA and has been authorized for detection and/or diagnosis of SARS-CoV-2 by FDA under an Emergency Use Authorization (EUA). This EUA will remain in effect (meaning this test can be used) for the duration of the COVID-19 declaration under Section 564(b)(1) of the Act, 21 U.S.C. section 360bbb-3(b)(1), unless the authorization is terminated or  revoked.     Resp Syncytial Virus by PCR NEGATIVE NEGATIVE Final    Comment: (NOTE) Fact Sheet for Patients: BloggerCourse.com  Fact Sheet for Healthcare Providers: SeriousBroker.it  This test is not yet approved or cleared by the [place] FDA and has been authorized for detection and/or diagnosis of SARS-CoV-2 by FDA under an Emergency Use Authorization (EUA). This EUA will remain in effect (meaning this test can be used) for the duration of the COVID-19 declaration under Section 564(b)(1) of the Act, 21 U.S.C. section 360bbb-3(b)(1), unless the authorization is terminated or revoked.  Performed at [facility], [address] [place], [place] [address]   Urine Culture (for pregnant, neutropenic or urologic patients or patients with an indwelling urinary catheter)     Status: Abnormal   Collection Time: 04/25/23  7:48 AM   Specimen: Urine, Clean Catch  Result Value Ref Range Status   Specimen Description URINE, CLEAN CATCH  Final   Special Requests NONE  Final   Culture (A)  Final    >=100,000 COLONIES/mL ESCHERICHIA COLI WITHIN MIXED ORGANISMS Performed at [facility], [address], [place], [place] [address]    Report Status 04/27/2023 FINAL  Final   Organism ID, Bacteria ESCHERICHIA COLI (A)  Final      Susceptibility    Escherichia coli - MIC*    AMPICILLIN <=2 SENSITIVE Sensitive     CEFAZOLIN <=4 SENSITIVE Sensitive     CEFEPIME <=0.12 SENSITIVE Sensitive     CEFTRIAXONE <=0.25 SENSITIVE Sensitive     CIPROFLOXACIN <=0.25 SENSITIVE Sensitive     GENTAMICIN <=1 SENSITIVE Sensitive     IMIPENEM <=0.25 SENSITIVE Sensitive     NITROFURANTOIN <=16 SENSITIVE Sensitive     TRIMETH/SULFA >=320 RESISTANT Resistant     AMPICILLIN/SULBACTAM <=2 SENSITIVE Sensitive     PIP/TAZO <=4 SENSITIVE Sensitive ug/mL    * >=100,000 COLONIES/mL ESCHERICHIA COLI  Culture, blood (Routine X 2) w Reflex to ID Panel     Status: None (Preliminary result)   Collection Time: 04/25/23  8:12 AM   Specimen: BLOOD RIGHT HAND  Result Value Ref Range Status   Specimen Description BLOOD RIGHT HAND  Final   Special Requests   Final    BOTTLES DRAWN AEROBIC AND ANAEROBIC Blood Culture results may not be optimal due to an inadequate volume of blood received in culture bottles   Culture   Final    NO GROWTH 2 DAYS Performed at [facility], [address], [place], [place] [address]    Report Status PENDING  Incomplete  Culture, blood (Routine X 2) w Reflex to ID Panel     Status: None (Preliminary result)   Collection Time: 04/25/23  8:16 AM   Specimen: BLOOD RIGHT ARM  Result Value Ref Range Status   Specimen Description BLOOD RIGHT ARM  Final   Special Requests   Final    BOTTLES DRAWN AEROBIC AND ANAEROBIC Blood Culture results may not be optimal due to an inadequate volume of blood received in culture bottles   Culture   Final    NO GROWTH 2 DAYS Performed at [facility], [address], [place], [place] [address]    Report Status PENDING  Incomplete  C Difficile Quick Screen w PCR reflex     Status: None   Collection Time: 04/26/23  4:15 AM   Specimen: STOOL  Result Value Ref Range Status   C Diff antigen NEGATIVE NEGATIVE Final  C Diff toxin NEGATIVE NEGATIVE Final   C Diff interpretation No C. difficile  detected.  Final    Comment: Performed at [facility], [address], [place], [place] [address]  Gastrointestinal Panel by PCR , Stool     Status: Abnormal   Collection Time: 04/26/23  4:15 AM   Specimen: Stool  Result Value Ref Range Status   Campylobacter species NOT DETECTED NOT DETECTED Final   Plesimonas shigelloides NOT DETECTED NOT DETECTED Final   Salmonella species NOT DETECTED NOT DETECTED Final   Yersinia enterocolitica NOT DETECTED NOT DETECTED Final   Vibrio species NOT DETECTED NOT DETECTED Final   Vibrio cholerae NOT DETECTED NOT DETECTED Final   Enteroaggregative E coli (EAEC) NOT DETECTED NOT DETECTED Final   Enteropathogenic E coli (EPEC) NOT DETECTED NOT DETECTED Final   Enterotoxigenic E coli (ETEC) NOT DETECTED NOT DETECTED Final   Shiga like toxin producing E coli (STEC) NOT DETECTED NOT DETECTED Final   Shigella/Enteroinvasive E coli (EIEC) NOT DETECTED NOT DETECTED Final   Cryptosporidium NOT DETECTED NOT DETECTED Final   Cyclospora cayetanensis NOT DETECTED NOT DETECTED Final   Entamoeba histolytica NOT DETECTED NOT DETECTED Final   Giardia lamblia NOT DETECTED NOT DETECTED Final   Adenovirus F40/41 NOT DETECTED NOT DETECTED Final   Astrovirus NOT DETECTED NOT DETECTED Final   Norovirus GI/GII DETECTED (A) NOT DETECTED Final    Comment: CRITICAL RESULT CALLED TO, READ BACK BY AND VERIFIED WITH: Billey Gosling RN [facility] @ [phone] 04/27/23 LFD    Rotavirus A NOT DETECTED NOT DETECTED Final   Sapovirus (I, II, IV, and V) NOT DETECTED NOT DETECTED Final    Comment: Performed at [facility], [address] [place], [place] [address]         Radiology Studies: DG Chest Port 1 View Result Date: 04/26/2023 CLINICAL DATA:  Shortness of breath EXAM: PORTABLE CHEST 1 VIEW COMPARISON:  Film from the previous day. FINDINGS: Cardiac shadow is stable. Increasing vascular congestion is noted with mild edema consistent with congestive failure. No sizable  effusion is seen. No bony abnormality is noted. IMPRESSION: Mild CHF. Electronically Signed   By: Alcide Clever M.D.   On: 04/26/2023 03:31        Scheduled Meds:  apixaban  5 mg Oral BID   diclofenac Sodium  2 g Topical BID   furosemide  80 mg Intravenous BID   insulin aspart  0-5 Units Subcutaneous QHS   insulin aspart  0-9 Units Subcutaneous TID WC   iron polysaccharides  150 mg Oral Daily   magnesium oxide  400 mg Oral Daily   melatonin  10 mg Oral QHS   metoprolol tartrate  25 mg Oral TID   mometasone-formoterol  1 puff Inhalation Daily   pantoprazole  40 mg Oral BID   potassium chloride  40 mEq Oral Daily   pregabalin  200 mg Oral BID   sodium chloride flush  3 mL Intravenous Q12H   sodium chloride flush  3-10 mL Intravenous Q12H   spironolactone  25 mg Oral Daily   Continuous Infusions:  cefTRIAXone (ROCEPHIN)  IV Stopped (04/26/23 1313)   diltiazem (CARDIZEM) infusion 15 mg/hr (04/27/23 0454)     LOS: 4 days    Time spent: 40 minutes    Ramiro Harvest, MD [facility]   To contact the attending provider between 7A-7P or the covering provider during after hours 7P-7A, please log into the web site [email or web address] and access using universal  [place] password for that web site. If you do not have the password, please call the hospital operator.  04/27/2023, 10:55 AM

## 2023-04-27 NOTE — Progress Notes (Signed)
 [facility] called with patients GI panel and informed this RN that patient tested positive for Norovirus, will initiate enteric isolation.

## 2023-04-27 NOTE — Plan of Care (Signed)
  Problem: Education: Goal: Ability to describe self-care measures that may prevent or decrease complications (Diabetes Survival Skills Education) will improve Outcome: Not Progressing   Problem: Coping: Goal: Ability to adjust to condition or change in health will improve Outcome: Not Progressing   Problem: Health Behavior/Discharge Planning: Goal: Ability to identify and utilize available resources and services will improve Outcome: Progressing Goal: Ability to manage health-related needs will improve Outcome: Progressing   Problem: Metabolic: Goal: Ability to maintain appropriate glucose levels will improve Outcome: Progressing   Problem: Nutritional: Goal: Maintenance of adequate nutrition will improve Outcome: Progressing Goal: Progress toward achieving an optimal weight will improve Outcome: Progressing   Problem: Skin Integrity: Goal: Risk for impaired skin integrity will decrease Outcome: Progressing   Problem: Tissue Perfusion: Goal: Adequacy of tissue perfusion will improve Outcome: Progressing   Problem: Education: Goal: Knowledge of General Education information will improve Description: Including pain rating scale, medication(s)/side effects and non-pharmacologic comfort measures Outcome: Progressing   Problem: Health Behavior/Discharge Planning: Goal: Ability to manage health-related needs will improve Outcome: Progressing   Problem: Clinical Measurements: Goal: Ability to maintain clinical measurements within normal limits will improve Outcome: Progressing Goal: Will remain free from infection Outcome: Progressing Goal: Diagnostic test results will improve Outcome: Progressing Goal: Respiratory complications will improve Outcome: Progressing Goal: Cardiovascular complication will be avoided Outcome: Not Progressing   Problem: Activity: Goal: Risk for activity intolerance will decrease Outcome: Progressing   Problem: Nutrition: Goal: Adequate  nutrition will be maintained Outcome: Progressing   Problem: Coping: Goal: Level of anxiety will decrease Outcome: Progressing   Problem: Elimination: Goal: Will not experience complications related to bowel motility Outcome: Progressing Goal: Will not experience complications related to urinary retention Outcome: Progressing   Problem: Pain Managment: Goal: General experience of comfort will improve and/or be controlled Outcome: Progressing   Problem: Safety: Goal: Ability to remain free from injury will improve Outcome: Progressing   Problem: Skin Integrity: Goal: Risk for impaired skin integrity will decrease Outcome: Progressing   Problem: Education: Goal: Ability to demonstrate management of disease process will improve Outcome: Progressing Goal: Ability to verbalize understanding of medication therapies will improve Outcome: Progressing Goal: Individualized Educational Video(s) Outcome: Progressing   Problem: Activity: Goal: Capacity to carry out activities will improve Outcome: Progressing   Problem: Cardiac: Goal: Ability to achieve and maintain adequate cardiopulmonary perfusion will improve Outcome: Progressing

## 2023-04-27 NOTE — TOC Progression Note (Signed)
 Transition of Care [facility]) - Progression Note    Patient Details  Name: Alyssa Blanchard MRN: [id number] Date of Birth: Mar 10, 1971  [facility] CM/SW Contact  Harriet Masson, RN Phone Number: 04/27/2023, 4:10 PM  Clinical Narrative:    Patient lives with mother and has all needed DME. Patient uses [employer] for transportation if she can't drive. Patient doesn't wear home 02 @ baseline.  Address, Phone number and PCP verified. TOC following.    Expected Discharge Plan: OP Rehab Barriers to Discharge: Continued Medical Work up  Expected Discharge Plan and Services       Living arrangements for the past 2 months: Single Family Home                                       Social Determinants of Health (SDOH) Interventions SDOH Screenings   Food Insecurity: No Food Insecurity (04/24/2023)  Housing: Low Risk  (04/24/2023)  Transportation Needs: Unmet Transportation Needs (04/24/2023)  Utilities: Not At Risk (04/24/2023)  Alcohol Screen: Low Risk  (11/24/2022)  Financial Resource Strain: Patient Declined (11/24/2022)  Physical Activity: Inactive (01/29/2023)   Received from [facility]  Social Connections: Socially Isolated (11/24/2022)  Stress: Stress Concern Present (11/24/2022)  Tobacco Use: Medium Risk (04/24/2023)    Readmission Risk Interventions    02/28/2022   11:05 AM  Readmission Risk Prevention Plan  Transportation Screening Complete  PCP or Specialist Appt within 5-7 Days Complete  Home Care Screening Complete  Medication Review (RN CM) Complete

## 2023-04-27 NOTE — Progress Notes (Signed)
 Rounding Note    Patient Name: Alyssa Blanchard Date of Encounter: 04/27/2023  [facility] Cardiologist: Chilton Si, MD   Subjective   53 year old female with history of atrial fibrillation with rapid ventricular response. She has been started on diltiazem and amiodarone for rate control. She has a history of untreated obstructive sleep apnea/obesity hypoventilation with pulmonary hypertension.  RV systolic pressure was 39 mmHg by echo. She appears to have high output heart failure.  She was thought to have diastolic heart failure although her diastolic function is normal by echo.  She is scheduled for cardioversion tomorrow    Inpatient Medications    Scheduled Meds:  apixaban  5 mg Oral BID   diclofenac Sodium  2 g Topical BID   furosemide  80 mg Intravenous BID   insulin aspart  0-5 Units Subcutaneous QHS   insulin aspart  0-9 Units Subcutaneous TID WC   iron polysaccharides  150 mg Oral Daily   magnesium oxide  400 mg Oral Daily   melatonin  10 mg Oral QHS   metoprolol tartrate  25 mg Oral TID   mometasone-formoterol  1 puff Inhalation Daily   pantoprazole  40 mg Oral BID   potassium chloride  40 mEq Oral Daily   pregabalin  200 mg Oral BID   sodium chloride flush  3 mL Intravenous Q12H   spironolactone  25 mg Oral Daily   Continuous Infusions:  cefTRIAXone (ROCEPHIN)  IV Stopped (04/26/23 1313)   diltiazem (CARDIZEM) infusion 15 mg/hr (04/27/23 0454)   magnesium sulfate bolus IVPB     PRN Meds: acetaminophen **OR** acetaminophen, bisacodyl, HYDROcodone-acetaminophen, levalbuterol, morphine injection, mouth rinse, prochlorperazine, senna-docusate   Vital Signs    Vitals:   04/27/23 0000 04/27/23 0422 04/27/23 0737 04/27/23 0739  BP: 99/73 120/63 113/66   Pulse: (!) 117 (!) 126 (!) 138   Resp: 13 12 19    Temp: 97.8 F (36.6 C)     TempSrc:  Oral Oral   SpO2: 98% 99% 97% 97%  Weight:  (!) 191.8 kg    Height:        Intake/Output  Summary (Last 24 hours) at 04/27/2023 0826 Last data filed at 04/27/2023 4540 Gross per 24 hour  Intake 2015.18 ml  Output 4000 ml  Net -1984.82 ml      04/27/2023    4:22 AM 04/26/2023    5:32 AM 04/25/2023    5:00 AM  Last 3 Weights  Weight (lbs) 422 lb 13.5 oz 426 lb 2.4 oz 431 lb 10.6 oz  Weight (kg) 191.8 kg 193.3 kg 195.8 kg      Telemetry    Atrial fib  - Personally Reviewed  ECG     - Personally Reviewed  Physical Exam   GEN: morbidly obese female No acute distress.   Neck: No JVD Cardiac: irreg.  no murmurs, rubs, or gallops.  Respiratory: Clear to auscultation bilaterally. GI: morbidly obese,  difficult to assess amount of ascites  MS: 1+  edema; No deformity.  She has areas / pouches of lymphedema that are chronic .    Neuro:  Nonfocal  Psych: Normal affect   Labs    High Sensitivity Troponin:   Recent Labs  Lab 04/23/23 2000 04/23/23 2145 04/26/23 0757 04/26/23 1058  TROPONINIHS 7 7 20* 21*     Chemistry Recent Labs  Lab 04/23/23 2000 04/24/23 0624 04/26/23 0204 04/26/23 1751 04/27/23 0213  NA 137   < > 136 134* 134*  K 3.1*   < > 3.1* 4.0 3.4*  CL 98   < > 93* 96* 92*  CO2 28   < > 31 27 31   GLUCOSE 128*   < > 127* 170* 133*  BUN 6   < > 7 8 8   CREATININE 0.84   < > 0.83 0.84 0.75  CALCIUM 8.4*   < > 7.8* 7.7* 7.5*  MG  --   --  1.3* 2.0 1.7  PROT 6.4*  --   --   --   --   ALBUMIN 2.9*  --   --   --  2.5*  AST 44*  --   --   --   --   ALT 28  --   --   --   --   ALKPHOS 88  --   --   --   --   BILITOT 1.1  --   --   --   --   GFRNONAA >60   < > >60 >60 >60  ANIONGAP 11   < > 12 11 11    < > = values in this interval not displayed.    Lipids No results for input(s): "CHOL", "TRIG", "HDL", "LABVLDL", "LDLCALC", "CHOLHDL" in the last 168 hours.  Hematology Recent Labs  Lab 04/25/23 0201 04/26/23 0204 04/27/23 0213  WBC 6.1 8.6 7.6  RBC 3.64* 3.33* 3.47*  HGB 9.5* 8.8* 9.1*  HCT 31.6* 29.2* 30.3*  MCV 86.8 87.7 87.3  MCH 26.1  26.4 26.2  MCHC 30.1 30.1 30.0  RDW 17.4* 17.4* 17.3*  PLT 166 128* 141*   Thyroid  Recent Labs  Lab 04/26/23 0758  TSH 0.867    BNP Recent Labs  Lab 04/23/23 2000 04/26/23 1058  BNP 66.4 169.4*    DDimer No results for input(s): "DDIMER" in the last 168 hours.   Radiology    DG Chest Port 1 View Result Date: 04/26/2023 CLINICAL DATA:  Shortness of breath EXAM: PORTABLE CHEST 1 VIEW COMPARISON:  Film from the previous day. FINDINGS: Cardiac shadow is stable. Increasing vascular congestion is noted with mild edema consistent with congestive failure. No sizable effusion is seen. No bony abnormality is noted. IMPRESSION: Mild CHF. Electronically Signed   By: Alcide Clever M.D.   On: 04/26/2023 03:31   DG CHEST PORT 1 VIEW Result Date: 04/25/2023 CLINICAL DATA:  [id number] Fever [id number] EXAM: PORTABLE CHEST 1 VIEW COMPARISON:  April 23, 2023 FINDINGS: The cardiomediastinal silhouette is unchanged and enlarged in contour. No pleural effusion. No pneumothorax. Similar appearance of scattered bandlike opacities bilaterally. IMPRESSION: Similar appearance of scattered bandlike opacities bilaterally, most likely atelectasis. Electronically Signed   By: Meda Klinefelter M.D.   On: 04/25/2023 11:54    Cardiac Studies     Patient Profile     53 y.o. female with morbid obesity, obesity hypoventilation, high-output congestive heart failure, untreated obstructive sleep apnea and recurrent atrial fibrillation.  Assessment & Plan    1.  Atrial fibrillation: Will schedule her for a cardioversion tomorrow.  She is not missed any doses of her anticoagulation.  Will hold her n.p.o. after midnight except for a small amount of water with her medications.  We discussed the risk, benefits, options regarding cardioversion.  She understands and agrees to proceed.  She has had cardioversions before.  2.  Massive obesity: This is clearly one of her main issues.  Continue with weight loss efforts.  2.   Obstructive sleep apnea.  She does not  wear CPAP.     For questions or updates, please contact [facility] Please consult [email or web address] for contact info under        Signed, Kristeen Miss, MD  04/27/2023, 8:26 AM

## 2023-04-28 ENCOUNTER — Encounter (HOSPITAL_COMMUNITY): Payer: Self-pay | Admitting: Internal Medicine

## 2023-04-28 ENCOUNTER — Inpatient Hospital Stay (HOSPITAL_COMMUNITY): Payer: Self-pay | Admitting: Anesthesiology

## 2023-04-28 ENCOUNTER — Encounter (HOSPITAL_COMMUNITY)
Admission: EM | Disposition: A | Discharge status: Rehabilitation Facility | Payer: Self-pay | Source: Home / Self Care | Attending: Internal Medicine

## 2023-04-28 DIAGNOSIS — R509 Fever, unspecified: Secondary | ICD-10-CM | POA: Diagnosis not present

## 2023-04-28 DIAGNOSIS — I4819 Other persistent atrial fibrillation: Secondary | ICD-10-CM

## 2023-04-28 DIAGNOSIS — I110 Hypertensive heart disease with heart failure: Secondary | ICD-10-CM

## 2023-04-28 DIAGNOSIS — I4891 Unspecified atrial fibrillation: Secondary | ICD-10-CM | POA: Diagnosis not present

## 2023-04-28 DIAGNOSIS — I5042 Chronic combined systolic (congestive) and diastolic (congestive) heart failure: Secondary | ICD-10-CM

## 2023-04-28 DIAGNOSIS — I5033 Acute on chronic diastolic (congestive) heart failure: Secondary | ICD-10-CM | POA: Diagnosis not present

## 2023-04-28 DIAGNOSIS — J45909 Unspecified asthma, uncomplicated: Secondary | ICD-10-CM | POA: Diagnosis not present

## 2023-04-28 DIAGNOSIS — Z87891 Personal history of nicotine dependence: Secondary | ICD-10-CM

## 2023-04-28 HISTORY — PX: CARDIOVERSION: EP1203

## 2023-04-28 LAB — CBC WITH DIFFERENTIAL/PLATELET
Abs Immature Granulocytes: 0.07 10*3/uL (ref 0.00–0.07)
Basophils Absolute: 0 10*3/uL (ref 0.0–0.1)
Basophils Relative: 0 %
Eosinophils Absolute: 0.2 10*3/uL (ref 0.0–0.5)
Eosinophils Relative: 3 %
HCT: 31 % — ABNORMAL LOW (ref 36.0–46.0)
Hemoglobin: 9.3 g/dL — ABNORMAL LOW (ref 12.0–15.0)
Immature Granulocytes: 1 %
Lymphocytes Relative: 19 %
Lymphs Abs: 1.3 10*3/uL (ref 0.7–4.0)
MCH: 26.3 pg (ref 26.0–34.0)
MCHC: 30 g/dL (ref 30.0–36.0)
MCV: 87.8 fL (ref 80.0–100.0)
Monocytes Absolute: 1 10*3/uL (ref 0.1–1.0)
Monocytes Relative: 14 %
Neutro Abs: 4.4 10*3/uL (ref 1.7–7.7)
Neutrophils Relative %: 63 %
Platelets: 134 10*3/uL — ABNORMAL LOW (ref 150–400)
RBC: 3.53 MIL/uL — ABNORMAL LOW (ref 3.87–5.11)
RDW: 17.7 % — ABNORMAL HIGH (ref 11.5–15.5)
WBC: 7 10*3/uL (ref 4.0–10.5)
nRBC: 0 % (ref 0.0–0.2)

## 2023-04-28 LAB — RENAL FUNCTION PANEL
Albumin: 2.7 g/dL — ABNORMAL LOW (ref 3.5–5.0)
Anion gap: 14 (ref 5–15)
BUN: 7 mg/dL (ref 6–20)
CO2: 29 mmol/L (ref 22–32)
Calcium: 8 mg/dL — ABNORMAL LOW (ref 8.9–10.3)
Chloride: 91 mmol/L — ABNORMAL LOW (ref 98–111)
Creatinine, Ser: 0.76 mg/dL (ref 0.44–1.00)
GFR, Estimated: >60 mL/min (ref >60)
Glucose, Bld: 126 mg/dL — ABNORMAL HIGH (ref 70–99)
Phosphorus: 3.3 mg/dL (ref 2.5–4.6)
Potassium: 3.8 mmol/L (ref 3.5–5.1)
Sodium: 134 mmol/L — ABNORMAL LOW (ref 135–145)

## 2023-04-28 LAB — GLUCOSE, CAPILLARY
Glucose-Capillary: 139 mg/dL — ABNORMAL HIGH (ref 70–99)
Glucose-Capillary: 153 mg/dL — ABNORMAL HIGH (ref 70–99)
Glucose-Capillary: 170 mg/dL — ABNORMAL HIGH (ref 70–99)
Glucose-Capillary: 133 mg/dL — ABNORMAL HIGH (ref 70–99)

## 2023-04-28 LAB — MAGNESIUM: Magnesium: 1.7 mg/dL (ref 1.7–2.4)

## 2023-04-28 SURGERY — CARDIOVERSION (CATH LAB)
Anesthesia: General

## 2023-04-28 MED ORDER — MAGNESIUM SULFATE 4 GM/100ML IV SOLN
4.0000 g | Freq: Once | INTRAVENOUS | Status: AC
  Administered 2023-04-28: 4 g via INTRAVENOUS
  Filled 2023-04-28: qty 100

## 2023-04-28 MED ORDER — SODIUM CHLORIDE 0.9% FLUSH
INTRAVENOUS | Status: DC | PRN
  Administered 2023-04-28: 10 mL via INTRAVENOUS

## 2023-04-28 MED ORDER — PROPOFOL 10 MG/ML IV BOLUS
INTRAVENOUS | Status: DC | PRN
Start: 2023-04-28 — End: 2023-04-28
  Administered 2023-04-28: 60 mg via INTRAVENOUS

## 2023-04-28 MED ORDER — LIDOCAINE 2% (20 MG/ML) 5 ML SYRINGE
INTRAMUSCULAR | Status: DC | PRN
  Administered 2023-04-28: 60 mg via INTRAVENOUS

## 2023-04-28 SURGICAL SUPPLY — 1 items: PAD DEFIB RADIO PHYSIO CONN (PAD) ×2 IMPLANT

## 2023-04-28 NOTE — Interval H&P Note (Signed)
 History and Physical Interval Note:  04/28/2023 10:46 AM  Alyssa Blanchard  has presented today for surgery, with the diagnosis of afib.  The various methods of treatment have been discussed with the patient and family. After consideration of risks, benefits and other options for treatment, the patient has consented to  Procedure(s): CARDIOVERSION (N/A) as a surgical intervention.  The patient's history has been reviewed, patient examined, no change in status, stable for surgery.  I have reviewed the patient's chart and labs.  Questions were answered to the patient's satisfaction.     [employer]

## 2023-04-28 NOTE — Anesthesia Postprocedure Evaluation (Signed)
 Anesthesia Post Note  Patient: Alyssa Blanchard  Procedure(s) Performed: CARDIOVERSION     Patient location during evaluation: Cath Lab Anesthesia Type: General Level of consciousness: awake and alert Pain management: pain level controlled Vital Signs Assessment: post-procedure vital signs reviewed and stable Respiratory status: spontaneous breathing, nonlabored ventilation, respiratory function stable and patient connected to nasal cannula oxygen Cardiovascular status: blood pressure returned to baseline and stable Postop Assessment: no apparent nausea or vomiting Anesthetic complications: no  No notable events documented.  Last Vitals:  Vitals:   04/28/23 1110 04/28/23 1120  BP: 109/69 108/72  Pulse: 80 76  Resp: (!) 26 14  Temp:    SpO2: 97% 97%    Last Pain:  Vitals:   04/28/23 1055  TempSrc:   PainSc: 0-No pain                 Lily Velasquez,W. EDMOND

## 2023-04-28 NOTE — Evaluation (Signed)
 Occupational Therapy Evaluation Patient Details Name: Alyssa Blanchard MRN: [id number] DOB: Dec 15, 1970 Today's Date: 04/28/2023   History of Present Illness   53 year old, morbidly obese female presented to the ED and admitted for acute on chronic HFpEF, also noted to have a norovirus gastroenteritis as well as a lower extremity cellulitis, with history of chronic HFpEF, PAF on Eliquis, asthma, type 2 diabetes, hypertension, depression/anxiety     Clinical Impressions Pt c/o pain to R leg and heel, 6/10. Pt lives at home with mother who is available 24/7, mother is able to do light housework. Pt PLOF independent with cane, drives, 5 STE, has single step up/down to get to living room and bathroom. Pt's mother assists with getting her legs into bed at times. Pt compelted bed mobility mod I for the most part, able to roll L/R using hand rails, supine to sit mod I with HOB elevated, needs min A to get BLEs back into bed. Pt currently requires increased assistance for LB ADLs, Pt states swelling to BLEs makes it more difficult. Pt able to stand at bedside with CGA, no LOB, did have some dizziness. Once back to bed BP checked, 120/66. Pt hopeful to quickly improve and return home, recommending postacute intensive rehab >3hrs/day, Pt states she has done it before with good success and does not want to go to and outside postacute facility. Pt states he hospital bed at home is broken and is less than 22 years old, hoping it can be replaced. Will continue to follow Pt acutely to maximize progress as able.      If plan is discharge home, recommend the following:   A lot of help with walking and/or transfers;A lot of help with bathing/dressing/bathroom;Assistance with cooking/housework;Assist for transportation;Help with stairs or ramp for entrance     Functional Status Assessment   Patient has had a recent decline in their functional status and demonstrates the ability to make significant improvements  in function in a reasonable and predictable amount of time.     Equipment Recommendations   Hospital bed;Other (comment) (Pt reports her current hospital bed is broken, less than 44 years old)     Recommendations for Other Services   Rehab consult     Precautions/Restrictions   Precautions Precautions: Fall Recall of Precautions/Restrictions: Intact Restrictions Weight Bearing Restrictions Per Provider Order: No     Mobility Bed Mobility Overal bed mobility: Needs Assistance Bed Mobility: Supine to Sit, Sit to Supine     Supine to sit: Contact guard Sit to supine: Min assist   General bed mobility comments: min A back to bed for BLEs    Transfers Overall transfer level: Needs assistance Equipment used: Straight cane Transfers: Sit to/from Stand Sit to Stand: Contact guard assist, +2 safety/equipment           General transfer comment: CGA x2 for safety, dizzy      Balance Overall balance assessment: Needs assistance Sitting-balance support: No upper extremity supported, Feet supported Sitting balance-Leahy Scale: Fair     Standing balance support: Single extremity supported, During functional activity Standing balance-Leahy Scale: Fair Standing balance comment: able to stand with cane, did get dizzy, no LOB                           ADL either performed or assessed with clinical judgement   ADL Overall ADL's : Needs assistance/impaired Eating/Feeding: Independent   Grooming: Set up;Sitting   Upper Body Bathing: Minimal assistance;With  adaptive equipment;Sitting   Lower Body Bathing: Moderate assistance;With adaptive equipment;Sitting/lateral leans   Upper Body Dressing : Minimal assistance;Sitting   Lower Body Dressing: Maximal assistance;Sit to/from stand   Toilet Transfer: Contact guard assist;+2 for safety/equipment;Stand-pivot;BSC/3in1;Rolling walker (2 wheels)   Toileting- Clothing Manipulation and Hygiene: Moderate  assistance;Sitting/lateral lean         General ADL Comments: Pt states due to edema LB ADLs have been more difficult, PLOF independent. Pt has good overall strength, able to stand at bedside CGA,     Vision Baseline Vision/History: 1 Wears glasses Ability to See in Adequate Light: 0 Adequate Patient Visual Report: No change from baseline       Perception         Praxis         Pertinent Vitals/Pain Pain Assessment Pain Assessment: 0-10 Pain Score: 6  Pain Location: R heel Pain Descriptors / Indicators: Aching, Discomfort Pain Intervention(s): Monitored during session     Extremity/Trunk Assessment Upper Extremity Assessment Upper Extremity Assessment: Overall WFL for tasks assessed   Lower Extremity Assessment Lower Extremity Assessment: Defer to PT evaluation       Communication Communication Communication: No apparent difficulties   Cognition Arousal: Alert Behavior During Therapy: WFL for tasks assessed/performed Cognition: No apparent impairments                               Following commands: Intact       Cueing  General Comments          Exercises     Shoulder Instructions      Home Living Family/patient expects to be discharged to:: Private residence Living Arrangements: Parent Available Help at Discharge: Family Type of Home: House Home Access: Stairs to enter Secretary/administrator of Steps: 5 Entrance Stairs-Rails: Can reach both Home Layout: One level;Other (Comment) (has steps up/down to restroom and living room)     Bathroom Shower/Tub: Tub/shower unit         Home Equipment: Pharmacist, hospital (2 wheels);Cane - single point;BSC/3in1;Hospital bed   Additional Comments: Pt sleeps in hospital bed which is currently broken, says she needs a new one. Lives with mother who is able to do light housework.      Prior Functioning/Environment Prior Level of Function : Independent/Modified Independent              Mobility Comments: occasionally uses cane, mother assists RLE into bed sometimes ADLs Comments: Mother helps get clothes out of closet, independent with dressing/bathing.    OT Problem List: Decreased activity tolerance;Impaired balance (sitting and/or standing);Pain;Increased edema   OT Treatment/Interventions: Self-care/ADL training;Therapeutic exercise;Energy conservation;DME and/or AE instruction;Manual therapy;Therapeutic activities;Patient/family education      OT Goals(Current goals can be found in the care plan section)   Acute Rehab OT Goals Patient Stated Goal: to return home OT Goal Formulation: With patient Time For Goal Achievement: 05/12/23 Potential to Achieve Goals: Good   OT Frequency:  Min 1X/week    Co-evaluation              AM-PAC OT "6 Clicks" Daily Activity     Outcome Measure Help from another person eating meals?: None Help from another person taking care of personal grooming?: A Little Help from another person toileting, which includes using toliet, bedpan, or urinal?: A Lot Help from another person bathing (including washing, rinsing, drying)?: A Lot Help from another person to put on and taking off regular  upper body clothing?: A Little Help from another person to put on and taking off regular lower body clothing?: A Lot 6 Click Score: 16   End of Session Equipment Utilized During Treatment: Oxygen;Other (comment) (cane) Nurse Communication: Mobility status  Activity Tolerance: Patient tolerated treatment well Patient left: in bed;with call bell/phone within reach  OT Visit Diagnosis: Unsteadiness on feet (R26.81);Other abnormalities of gait and mobility (R26.89);Muscle weakness (generalized) (M62.81);Dizziness and giddiness (R42);Pain Pain - Right/Left: Right Pain - part of body: Leg;Ankle and joints of foot                Time: 3086-5784 OT Time Calculation (min): 34 min Charges:  OT General Charges $OT Visit: 1 Visit OT  Evaluation $OT Eval Moderate Complexity: 1 Mod OT Treatments $Self Care/Home Management : 8-22 mins  [place], OTR/L   Alexis Goodell 04/28/2023, 2:49 PM

## 2023-04-28 NOTE — Transfer of Care (Signed)
 Immediate Anesthesia Transfer of Care Note  Patient: Alyssa Blanchard  Procedure(s) Performed: CARDIOVERSION  Patient Location: Cath Lab  Anesthesia Type:MAC  Level of Consciousness: awake, drowsy, and patient cooperative  Airway & Oxygen Therapy: Patient Spontanous Breathing and Patient connected to face mask oxygen  Post-op Assessment: Report given to RN, Post -op Vital signs reviewed and stable, and Patient moving all extremities X 4  Post vital signs: Reviewed and stable  Last Vitals:  Vitals Value Tak/en Time  BP 115/66 04/28/23 1053   Temp    Pulse 117 04/28/23 1053  Resp 24 04/28/23 1053  SpO2 96 % 04/28/23 1053  Vitals shown include unfiled device data.  Last Pain:  Vitals:   04/28/23 1031  TempSrc:   PainSc: 0-No pain      Patients Stated Pain Goal: 0 (04/27/23 1945)  Complications: No notable events documented.

## 2023-04-28 NOTE — Progress Notes (Addendum)
 PROGRESS NOTE    Alyssa Blanchard  HQI:[id number] DOB: 1970/07/01 DOA: 04/23/2023 PCP: Lenox Ponds, MD    Chief Complaint  Patient presents with   Shortness of Breath    Brief Narrative:  Patient is a 53 year old, morbidly obese female with history of chronic HFpEF, PAF on Eliquis, asthma, type 2 diabetes, hypertension, depression/anxiety presented to the ED and admitted for acute on chronic HFpEF.  Cardiology consulted.  Patient noted to go into rapid A-fib with RVR, placed on Cardizem drip as well as oral metoprolol.  Followed by cardiology and patient for cardioversion 04/28/2023.  Patient also noted to have a norovirus gastroenteritis as well as a lower extremity cellulitis.   Assessment & Plan:   Principal Problem:   Acute on chronic heart failure with preserved ejection fraction (HFpEF) (HCC) Active Problems:   Cellulitis of right leg   Paroxysmal atrial fibrillation (HCC)   Type 2 diabetes mellitus without complication, without long-term current use of insulin (HCC)   Asthma   Depression with anxiety   Hypertension   Fever   Diarrhea   UTI (urinary tract infection)   Hypomagnesemia   Hypokalemia   Atrial fibrillation with rapid ventricular response (HCC)   Gastroenteritis due to norovirus  #1 acute on chronic HFpEF -Patient presented with progressive dyspnea on exertion, weight gain, peripheral edema. -BNP noted at 66.4 >>> 169.4. -Patient recently admitted at [facility] weight on discharge at the time was 179 kg on discharge weight on presentation noted at 200.9 kg. -Patient endorses dietary compliance as well as medication compliance. -Patient noted in the ED to have some low blood pressure readings however concern for accuracy likely limited due to body habitus. -Recent 2D echo at outside hospital on 03/10/2023 with a EF of 60 to 65%, moderate pulmonary hypertension. -Repeat 2D echo with EF of 70 to 75%,NWMA, normal right ventricular systolic function,  mildly elevated pulmonary artery systolic pressure. -Patient initially on admission was placed on Lasix 40 mg IV every 12 hours which was increased to 60 mg every 12 hours per cardiology and subsequently increased to 80 mg IV every 12 hours with a urine output of 3.3 L recorded over the past 24 hours.  -Current weight of 185.6 kg from 191.8 kg from 200.9 kg on admission. -Patient initially started on midodrine for BP support which has been discontinued, BP currently stable. -Spironolactone resumed per cardiology.   -Strict I's and O's, daily weights. -Continue CPAP nightly if able to. -Cardiology following and appreciate their input and recommendations.    2.  Paroxysmal A-fib/A-fib with RVR -Patient noted initially to be in normal sinus rhythm on admission however went into A-fib with RVR overnight with heart rate sustained in the 150s to 180s.   -Patient placed on a Cardizem drip currently at 15 mg/h.  However heart rate still in the 130s. -Status post Lopressor 5 mg IV x 1.  -Noted to be on propranolol 80 mg 3 times daily prior to admission, which is currently on hold. -Patient started on Lopressor tartrate 25 mg p.o. 3 times daily per cardiology on 04/26/2023. -Cardiology feels patient may likely need cardioversion which is planned for today, 04/28/2023, DCCV with no missed doses of Eliquis. -Continue Eliquis for anticoagulation. -Cardiology following appreciate input and recommendations.  3.  Fever/E. coli UTI -Patient noted to have a temp of 101 (04/25/2023). -Patient with nausea and emesis the morning of 04/25/2023, noted to have watery diarrhea per RN. -Fever curve trended down, clinical improvement, C. difficile  PCR negative.   -GI pathogen panel positive for norovirus.   -Blood cultures with no growth x 3 day.   -Urinalysis with large leukocytes, nitrite negative, 6-10 WBCs, urine cultures with > 100,000 colonies of E. coli resistant to Bactrim otherwise pansensitive.   -Continue IV  Rocephin for another 24 hours and could transition to oral antibiotics tomorrow to complete a course of antibiotic treatment.   4.  Hypokalemia/hypomagnesemia -Likely secondary to diuresis and GI losses. -Potassium at 3.8, magnesium at 1.7. -Continue K-Dur 40 mEq p.o. daily.   -Magnesium sulfate 2 g IV x 1. -Keep potassium approximately 4, magnesium approximately 2. -Repeat labs in AM.  5.  Right thigh cellulitis -Clinical improvement after initiation of IV antibiotics.   -Continue IV Rocephin for another 24 hours and transition to oral antibiotics to complete course of antibiotic treatment.   6.  Gastroenteritis secondary to norovirus /diarrhea -Patient noted to have watery diarrhea the morning of 04/25/2023 with associated nausea and vomiting.   -Nausea and vomiting improved.   -Clinical improvement, no nausea, no vomiting, no diarrhea.  -C. difficile PCR negative. -GI pathogen panel positive for norovirus. -Supportive care.  7.  Hypertension -On admission patient noted to have intermittently low BPs however concern for accuracy given body habitus. -Antihypertensive medications on hold while patient on IV diuretics. -Patient initially started on midodrine which has been discontinued per cardiology. -BP currently stable.  8.  Asthma -Continue Breo Ellipta and Xopenex inhaler as needed.   9.  Well-controlled type 2 diabetes mellitus -Hemoglobin A1c 6.7% on 03/09/2023. -CBG 139 this morning. -SSI.  10.  Depression/anxiety -Stable. -Not on any medications per med rec prior to admission.   -Outpatient follow-up.   11.  Morbid obesity -BMI 73.7 kg/m -Patient may have underlying OSA/OHS. -Per admitting physician patient stated scheduled for sleep study in April. -Lifestyle modification. -Outpatient follow-up with PCP.   DVT prophylaxis: Eliquis Code Status: Full Family Communication: Updated patient.  No family at bedside. Disposition: Remain in progressive care.   TBD  Status is: Inpatient Remains inpatient appropriate because: Severity of illness   Consultants:  Cardiology: Dr. Jacques Navy 04/24/2023  Procedures:  Chest x-ray 04/23/2023 2D echo 04/24/2023 Cardioversion pending 04/28/2023  Antimicrobials:  Anti-infectives (From admission, onward)    Start     Dose/Rate Route Frequency Ordered Stop   04/25/23 1115  [MAR Hold]  cefTRIAXone (ROCEPHIN) 2 g in sodium chloride 0.9 % 100 mL IVPB        (MAR Hold since Tue 04/28/2023 at 1023.Hold Reason: Transfer to a Procedural area)   2 g 200 mL/hr over 30 Minutes Intravenous Every 24 hours 04/25/23 1025           Subjective: Patient laying in bed, denies any further nausea or vomiting, no diarrhea.  No chest pain.  Still with shortness of breath.  Feels edema is improving as she can now see her ankles and bones in her foot.  States she is hungry.  Worried/anxious about upcoming cardioversion today.    Objective: Vitals:   04/28/23 0758 04/28/23 0913 04/28/23 1021 04/28/23 1031  BP: (!) 147/66  128/75   Pulse: 90  (!) 134 (!) 126  Resp: 18  20 20   Temp: 98.3 F (36.8 C)  98 F (36.7 C)   TempSrc: Oral  Temporal   SpO2: 95% 95% 95% 94%  Weight:      Height:        Intake/Output Summary (Last 24 hours) at 04/28/2023 1035 Last data filed  at 04/28/2023 0945 Gross per 24 hour  Intake 120 ml  Output 3950 ml  Net -3830 ml   Filed Weights   04/26/23 0532 04/27/23 0422 04/28/23 0339  Weight: (!) 193.3 kg (!) 191.8 kg (!) 185.6 kg    Examination:  General exam: Alert. Respiratory system: Diffuse scattered crackles.  Distant breath sounds due to body habitus.  No wheezing.  Fair air movement.  Speaking in full sentences.   Cardiovascular system: Distant heart sounds.  Irregularly irregular.  Unable to assess JVD due to thick neck.  Trace to 1+ bilateral lower extremity edema.  Gastrointestinal system: Abdomen is obese, nondistended, soft, nontender to palpation, positive bowel sounds.  No  rebound.  No guarding. Central nervous system: Alert and oriented x 4.  Moving extremities spontaneously.  No focal neurological deficits.  Extremities: Right thigh erythema significantly improved.  No warmth.  Nontender to palpation.  Symmetric 5 x 5 power.  Skin: No rashes, lesions or ulcers Psychiatry: Judgement and insight appear normal. Mood & affect appropriate.     Data Reviewed: I have personally reviewed following labs and imaging studies  CBC: Recent Labs  Lab 04/24/23 0624 04/25/23 0201 04/26/23 0204 04/27/23 0213 04/28/23 0221  WBC 7.6 6.1 8.6 7.6 7.0  NEUTROABS  --   --  6.0 5.0 4.4  HGB 8.8* 9.5* 8.8* 9.1* 9.3*  HCT 29.9* 31.6* 29.2* 30.3* 31.0*  MCV 87.2 86.8 87.7 87.3 87.8  PLT 205 166 128* 141* 134*    Basic Metabolic Panel: Recent Labs  Lab 04/25/23 0201 04/26/23 0204 04/26/23 1751 04/27/23 0213 04/28/23 0221  NA 138 136 134* 134* 134*  K 3.1* 3.1* 4.0 3.4* 3.8  CL 99 93* 96* 92* 91*  CO2 30 31 27 31 29   GLUCOSE 152* 127* 170* 133* 126*  BUN 7 7 8 8 7   CREATININE 0.89 0.83 0.84 0.75 0.76  CALCIUM 7.8* 7.8* 7.7* 7.5* 8.0*  MG  --  1.3* 2.0 1.7 1.7  PHOS  --   --   --  2.8 3.3    GFR: Estimated Creatinine Clearance: 140.8 mL/min (by C-G formula based on SCr of 0.76 mg/dL).  Liver Function Tests: Recent Labs  Lab 04/23/23 2000 04/27/23 0213 04/28/23 0221  AST 44*  --   --   ALT 28  --   --   ALKPHOS 88  --   --   BILITOT 1.1  --   --   PROT 6.4*  --   --   ALBUMIN 2.9* 2.5* 2.7*    CBG: Recent Labs  Lab 04/27/23 0614 04/27/23 1118 04/27/23 1602 04/27/23 2201 04/28/23 0616  GLUCAP 131* 167* 139* 161* 139*     Recent Results (from the past 240 hours)  Resp panel by RT-PCR (RSV, Flu A&B, Covid) Anterior Nasal Swab     Status: None   Collection Time: 04/23/23  5:57 PM   Specimen: Anterior Nasal Swab  Result Value Ref Range Status   SARS Coronavirus 2 by RT PCR NEGATIVE NEGATIVE Final   Influenza A by PCR NEGATIVE NEGATIVE  Final   Influenza B by PCR NEGATIVE NEGATIVE Final    Comment: (NOTE) The Xpert Xpress SARS-CoV-2/FLU/RSV plus assay is intended as an aid in the diagnosis of influenza from Nasopharyngeal swab specimens and should not be used as a sole basis for treatment. Nasal washings and aspirates are unacceptable for Xpert Xpress SARS-CoV-2/FLU/RSV testing.  Fact Sheet for Patients: BloggerCourse.com  Fact Sheet for Healthcare Providers: SeriousBroker.it  This  test is not yet approved or cleared by the [place] and has been authorized for detection and/or diagnosis of SARS-CoV-2 by FDA under an Emergency Use Authorization (EUA). This EUA will remain in effect (meaning this test can be used) for the duration of the COVID-19 declaration under Section 564(b)(1) of the Act, 21 U.S.C. section 360bbb-3(b)(1), unless the authorization is terminated or revoked.     Resp Syncytial Virus by PCR NEGATIVE NEGATIVE Final    Comment: (NOTE) Fact Sheet for Patients: BloggerCourse.com  Fact Sheet for Healthcare Providers: SeriousBroker.it  This test is not yet approved or cleared by the [place] FDA and has been authorized for detection and/or diagnosis of SARS-CoV-2 by FDA under an Emergency Use Authorization (EUA). This EUA will remain in effect (meaning this test can be used) for the duration of the COVID-19 declaration under Section 564(b)(1) of the Act, 21 U.S.C. section 360bbb-3(b)(1), unless the authorization is terminated or revoked.  Performed at [facility], [address] [place], [place] [address]   Urine Culture (for pregnant, neutropenic or urologic patients or patients with an indwelling urinary catheter)     Status: Abnormal   Collection Time: 04/25/23  7:48 AM   Specimen: Urine, Clean Catch  Result Value Ref Range Status   Specimen Description URINE, CLEAN CATCH   Final   Special Requests NONE  Final   Culture (A)  Final    >=100,000 COLONIES/mL ESCHERICHIA COLI WITHIN MIXED ORGANISMS Performed at [facility], [address] [place], [place] [address]    Report Status 04/27/2023 FINAL  Final   Organism ID, Bacteria ESCHERICHIA COLI (A)  Final      Susceptibility   Escherichia coli - MIC*    AMPICILLIN <=2 SENSITIVE Sensitive     CEFAZOLIN <=4 SENSITIVE Sensitive     CEFEPIME <=0.12 SENSITIVE Sensitive     CEFTRIAXONE <=0.25 SENSITIVE Sensitive     CIPROFLOXACIN <=0.25 SENSITIVE Sensitive     GENTAMICIN <=1 SENSITIVE Sensitive     IMIPENEM <=0.25 SENSITIVE Sensitive     NITROFURANTOIN <=16 SENSITIVE Sensitive     TRIMETH/SULFA >=320 RESISTANT Resistant     AMPICILLIN/SULBACTAM <=2 SENSITIVE Sensitive     PIP/TAZO <=4 SENSITIVE Sensitive ug/mL    * >=100,000 COLONIES/mL ESCHERICHIA COLI  Culture, blood (Routine X 2) w Reflex to ID Panel     Status: None (Preliminary result)   Collection Time: 04/25/23  8:12 AM   Specimen: BLOOD RIGHT HAND  Result Value Ref Range Status   Specimen Description BLOOD RIGHT HAND  Final   Special Requests   Final    BOTTLES DRAWN AEROBIC AND ANAEROBIC Blood Culture results may not be optimal due to an inadequate volume of blood received in culture bottles   Culture   Final    NO GROWTH 3 DAYS Performed at [facility], [address], [place], [place] [address]    Report Status PENDING  Incomplete  Culture, blood (Routine X 2) w Reflex to ID Panel     Status: None (Preliminary result)   Collection Time: 04/25/23  8:16 AM   Specimen: BLOOD RIGHT ARM  Result Value Ref Range Status   Specimen Description BLOOD RIGHT ARM  Final   Special Requests   Final    BOTTLES DRAWN AEROBIC AND ANAEROBIC Blood Culture results may not be optimal due to an inadequate volume of blood received in culture bottles   Culture   Final    NO GROWTH  3 DAYS Performed at [facility], [address]  [place], [place] [address]    Report Status PENDING  Incomplete  C Difficile Quick Screen w PCR reflex     Status: None   Collection Time: 04/26/23  4:15 AM   Specimen: STOOL  Result Value Ref Range Status   C Diff antigen NEGATIVE NEGATIVE Final   C Diff toxin NEGATIVE NEGATIVE Final   C Diff interpretation No C. difficile detected.  Final    Comment: Performed at [facility], [address] [place], [place] [address]  Gastrointestinal Panel by PCR , Stool     Status: Abnormal   Collection Time: 04/26/23  4:15 AM   Specimen: Stool  Result Value Ref Range Status   Campylobacter species NOT DETECTED NOT DETECTED Final   Plesimonas shigelloides NOT DETECTED NOT DETECTED Final   Salmonella species NOT DETECTED NOT DETECTED Final   Yersinia enterocolitica NOT DETECTED NOT DETECTED Final   Vibrio species NOT DETECTED NOT DETECTED Final   Vibrio cholerae NOT DETECTED NOT DETECTED Final   Enteroaggregative E coli (EAEC) NOT DETECTED NOT DETECTED Final   Enteropathogenic E coli (EPEC) NOT DETECTED NOT DETECTED Final   Enterotoxigenic E coli (ETEC) NOT DETECTED NOT DETECTED Final   Shiga like toxin producing E coli (STEC) NOT DETECTED NOT DETECTED Final   Shigella/Enteroinvasive E coli (EIEC) NOT DETECTED NOT DETECTED Final   Cryptosporidium NOT DETECTED NOT DETECTED Final   Cyclospora cayetanensis NOT DETECTED NOT DETECTED Final   Entamoeba histolytica NOT DETECTED NOT DETECTED Final   Giardia lamblia NOT DETECTED NOT DETECTED Final   Adenovirus F40/41 NOT DETECTED NOT DETECTED Final   Astrovirus NOT DETECTED NOT DETECTED Final   Norovirus GI/GII DETECTED (A) NOT DETECTED Final    Comment: CRITICAL RESULT CALLED TO, READ BACK BY AND VERIFIED WITH: Billey Gosling RN [facility] @ [phone] 04/27/23 LFD    Rotavirus A NOT DETECTED NOT DETECTED Final   Sapovirus (I, II, IV, and V) NOT DETECTED NOT DETECTED Final    Comment: Performed at [facility], [address], [place], [place] [address]          Radiology Studies: No results found.       Scheduled Meds:  [MAR Hold] apixaban  5 mg Oral BID   [MAR Hold] diclofenac Sodium  2 g Topical BID   [MAR Hold] furosemide  80 mg Intravenous BID   [MAR Hold] insulin aspart  0-5 Units Subcutaneous QHS   [MAR Hold] insulin aspart  0-9 Units Subcutaneous TID WC   [MAR Hold] iron polysaccharides  150 mg Oral Daily   [MAR Hold] magnesium oxide  400 mg Oral Daily   [MAR Hold] melatonin  10 mg Oral QHS   [MAR Hold] metoprolol tartrate  25 mg Oral TID   [MAR Hold] mometasone-formoterol  1 puff Inhalation Daily   [MAR Hold] pantoprazole  40 mg Oral BID   [MAR Hold] potassium chloride  40 mEq Oral Daily   [MAR Hold] pregabalin  200 mg Oral BID   [MAR Hold] sodium chloride flush  3 mL Intravenous Q12H   sodium chloride flush  3-10 mL Intravenous Q12H   [MAR Hold] spironolactone  25 mg Oral Daily   Continuous Infusions:  [MAR Hold] cefTRIAXone (ROCEPHIN)  IV 2 g (04/27/23 1125)   diltiazem (CARDIZEM) infusion 15 mg/hr (04/28/23 0755)   magnesium sulfate bolus IVPB 4 g (04/28/23 0953)     LOS: 5 days  Time spent: 40 minutes    Ramiro Harvest, MD [facility]   To contact the attending provider between 7A-7P or the covering provider during after hours 7P-7A, please log into the web site [email or web address] and access using universal [place] password for that web site. If you do not have the password, please call the hospital operator.  04/28/2023, 10:35 AM

## 2023-04-28 NOTE — Progress Notes (Signed)
 OT Cancellation Note  Patient Details Name: Kenise Barraco MRN: [id number] DOB: 10/21/1970   Cancelled Treatment:    Reason Eval/Treat Not Completed: (P) Patient at procedure or test/ unavailable, cardioversion, will return as time allows  Alexis Goodell 04/28/2023, 11:43 AM

## 2023-04-28 NOTE — Progress Notes (Signed)
 Rounding Note    Patient Name: Alyssa Blanchard Date of Encounter: 04/28/2023  [facility] Cardiologist: Chilton Si, MD   Subjective   53 year old female with history of atrial fibrillation with rapid ventricular response. She has been started on diltiazem and amiodarone for rate control. She has a history of untreated obstructive sleep apnea/obesity hypoventilation with pulmonary hypertension.  RV systolic pressure was 39 mmHg by echo. She appears to have high output heart failure.  She was thought to have diastolic heart failure although her diastolic function is normal  She is scheduled for CV today   I/O are -6.3 liters so far this admission    Inpatient Medications    Scheduled Meds:  apixaban  5 mg Oral BID   diclofenac Sodium  2 g Topical BID   furosemide  80 mg Intravenous BID   insulin aspart  0-5 Units Subcutaneous QHS   insulin aspart  0-9 Units Subcutaneous TID WC   iron polysaccharides  150 mg Oral Daily   magnesium oxide  400 mg Oral Daily   melatonin  10 mg Oral QHS   metoprolol tartrate  25 mg Oral TID   mometasone-formoterol  1 puff Inhalation Daily   pantoprazole  40 mg Oral BID   potassium chloride  40 mEq Oral Daily   pregabalin  200 mg Oral BID   sodium chloride flush  3 mL Intravenous Q12H   sodium chloride flush  3-10 mL Intravenous Q12H   spironolactone  25 mg Oral Daily   Continuous Infusions:  cefTRIAXone (ROCEPHIN)  IV 2 g (04/27/23 1125)   diltiazem (CARDIZEM) infusion 15 mg/hr (04/28/23 0755)   magnesium sulfate bolus IVPB     PRN Meds: acetaminophen **OR** acetaminophen, bisacodyl, HYDROcodone-acetaminophen, levalbuterol, morphine injection, mouth rinse, prochlorperazine, senna-docusate, sodium chloride flush   Vital Signs    Vitals:   04/27/23 1938 04/27/23 2305 04/28/23 0339 04/28/23 0758  BP: 123/75 101/77 114/60 (!) 147/66  Pulse: (!) 138 93 96 90  Resp: 14 20 19 18   Temp: 98 F (36.7 C) 97.7 F (36.5 C)  98.4 F (36.9 C) 98.3 F (36.8 C)  TempSrc: Oral Oral Oral Oral  SpO2: 93% 94% 93% 95%  Weight:   (!) 185.6 kg   Height:        Intake/Output Summary (Last 24 hours) at 04/28/2023 0830 Last data filed at 04/27/2023 1900 Gross per 24 hour  Intake 120 ml  Output 3350 ml  Net -3230 ml      04/28/2023    3:39 AM 04/27/2023    4:22 AM 04/26/2023    5:32 AM  Last 3 Weights  Weight (lbs) 409 lb 2.8 oz 422 lb 13.5 oz 426 lb 2.4 oz  Weight (kg) 185.6 kg 191.8 kg 193.3 kg      Telemetry    Atrial fib with RVR   Personally Reviewed  ECG     - Personally Reviewed  Physical Exam  Physical Exam: Blood pressure (!) 147/66, pulse 90, temperature 98.3 F (36.8 C), temperature source Oral, resp. rate 18, height 5\' 5"  (1.651 m), weight (!) 185.6 kg, last menstrual period 12/20/2017, SpO2 95%.       GEN:  morbidly obese female  HEENT: Normal NECK: No JVD; No carotid bruits LYMPHATICS: No lymphadenopathy CARDIAC: irreg. Irreg. , soft systolic murmur  RESPIRATORY:  Clear to auscultation without rales, wheezing or rhonchi  ABDOMEN: morbidly obese , very difficult to assess fluid status  MUSCULOSKELETAL:  No edema; No deformity ,  legs are very large   SKIN: Warm and dry NEUROLOGIC:  Alert and oriented x 3   Labs    High Sensitivity Troponin:   Recent Labs  Lab 04/23/23 2000 04/23/23 2145 04/26/23 0757 04/26/23 1058  TROPONINIHS 7 7 20* 21*     Chemistry Recent Labs  Lab 04/23/23 2000 04/24/23 0624 04/26/23 1751 04/27/23 0213 04/28/23 0221  NA 137   < > 134* 134* 134*  K 3.1*   < > 4.0 3.4* 3.8  CL 98   < > 96* 92* 91*  CO2 28   < > 27 31 29   GLUCOSE 128*   < > 170* 133* 126*  BUN 6   < > 8 8 7   CREATININE 0.84   < > 0.84 0.75 0.76  CALCIUM 8.4*   < > 7.7* 7.5* 8.0*  MG  --    < > 2.0 1.7 1.7  PROT 6.4*  --   --   --   --   ALBUMIN 2.9*  --   --  2.5* 2.7*  AST 44*  --   --   --   --   ALT 28  --   --   --   --   ALKPHOS 88  --   --   --   --   BILITOT 1.1   --   --   --   --   GFRNONAA >60   < > >60 >60 >60  ANIONGAP 11   < > 11 11 14    < > = values in this interval not displayed.    Lipids No results for input(s): "CHOL", "TRIG", "HDL", "LABVLDL", "LDLCALC", "CHOLHDL" in the last 168 hours.  Hematology Recent Labs  Lab 04/26/23 0204 04/27/23 0213 04/28/23 0221  WBC 8.6 7.6 7.0  RBC 3.33* 3.47* 3.53*  HGB 8.8* 9.1* 9.3*  HCT 29.2* 30.3* 31.0*  MCV 87.7 87.3 87.8  MCH 26.4 26.2 26.3  MCHC 30.1 30.0 30.0  RDW 17.4* 17.3* 17.7*  PLT 128* 141* 134*   Thyroid  Recent Labs  Lab 04/26/23 0758  TSH 0.867    BNP Recent Labs  Lab 04/23/23 2000 04/26/23 1058  BNP 66.4 169.4*    DDimer No results for input(s): "DDIMER" in the last 168 hours.   Radiology    No results found.   Cardiac Studies     Patient Profile     53 y.o. female with morbid obesity, obesity hypoventilation, high-output congestive heart failure, untreated obstructive sleep apnea and recurrent atrial fibrillation.  Assessment & Plan    1.  Atrial fibrillation: cardioversion is scheduled for today . She has not missed any doses of eliquis   We discussed the risk, benefits, options regarding cardioversion.  She understands and agrees to proceed.  She has had cardioversions before.  2.  Massive obesity:  cont with weight loss efforts   2.  Obstructive sleep apnea.  Does not use CPAP .  Will defer to IM or her primary md\]     For questions or updates, please contact [facility] Please consult [email or web address] for contact info under        Signed, Kristeen Miss, MD  04/28/2023, 8:30 AM

## 2023-04-28 NOTE — H&P (View-Only) (Signed)
 Rounding Note    Patient Name: Alyssa Blanchard Date of Encounter: 04/28/2023  [facility] Cardiologist: Chilton Si, MD   Subjective   53 year old female with history of atrial fibrillation with rapid ventricular response. She has been started on diltiazem and amiodarone for rate control. She has a history of untreated obstructive sleep apnea/obesity hypoventilation with pulmonary hypertension.  RV systolic pressure was 39 mmHg by echo. She appears to have high output heart failure.  She was thought to have diastolic heart failure although her diastolic function is normal  She is scheduled for CV today   I/O are -6.3 liters so far this admission    Inpatient Medications    Scheduled Meds:  apixaban  5 mg Oral BID   diclofenac Sodium  2 g Topical BID   furosemide  80 mg Intravenous BID   insulin aspart  0-5 Units Subcutaneous QHS   insulin aspart  0-9 Units Subcutaneous TID WC   iron polysaccharides  150 mg Oral Daily   magnesium oxide  400 mg Oral Daily   melatonin  10 mg Oral QHS   metoprolol tartrate  25 mg Oral TID   mometasone-formoterol  1 puff Inhalation Daily   pantoprazole  40 mg Oral BID   potassium chloride  40 mEq Oral Daily   pregabalin  200 mg Oral BID   sodium chloride flush  3 mL Intravenous Q12H   sodium chloride flush  3-10 mL Intravenous Q12H   spironolactone  25 mg Oral Daily   Continuous Infusions:  cefTRIAXone (ROCEPHIN)  IV 2 g (04/27/23 1125)   diltiazem (CARDIZEM) infusion 15 mg/hr (04/28/23 0755)   magnesium sulfate bolus IVPB     PRN Meds: acetaminophen **OR** acetaminophen, bisacodyl, HYDROcodone-acetaminophen, levalbuterol, morphine injection, mouth rinse, prochlorperazine, senna-docusate, sodium chloride flush   Vital Signs    Vitals:   04/27/23 1938 04/27/23 2305 04/28/23 0339 04/28/23 0758  BP: 123/75 101/77 114/60 (!) 147/66  Pulse: (!) 138 93 96 90  Resp: 14 20 19 18   Temp: 98 F (36.7 C) 97.7 F (36.5 C)  98.4 F (36.9 C) 98.3 F (36.8 C)  TempSrc: Oral Oral Oral Oral  SpO2: 93% 94% 93% 95%  Weight:   (!) 185.6 kg   Height:        Intake/Output Summary (Last 24 hours) at 04/28/2023 0830 Last data filed at 04/27/2023 1900 Gross per 24 hour  Intake 120 ml  Output 3350 ml  Net -3230 ml      04/28/2023    3:39 AM 04/27/2023    4:22 AM 04/26/2023    5:32 AM  Last 3 Weights  Weight (lbs) 409 lb 2.8 oz 422 lb 13.5 oz 426 lb 2.4 oz  Weight (kg) 185.6 kg 191.8 kg 193.3 kg      Telemetry    Atrial fib with RVR   Personally Reviewed  ECG     - Personally Reviewed  Physical Exam  Physical Exam: Blood pressure (!) 147/66, pulse 90, temperature 98.3 F (36.8 C), temperature source Oral, resp. rate 18, height 5\' 5"  (1.651 m), weight (!) 185.6 kg, last menstrual period 12/20/2017, SpO2 95%.       GEN:  morbidly obese female  HEENT: Normal NECK: No JVD; No carotid bruits LYMPHATICS: No lymphadenopathy CARDIAC: irreg. Irreg. , soft systolic murmur  RESPIRATORY:  Clear to auscultation without rales, wheezing or rhonchi  ABDOMEN: morbidly obese , very difficult to assess fluid status  MUSCULOSKELETAL:  No edema; No deformity ,  legs are very large   SKIN: Warm and dry NEUROLOGIC:  Alert and oriented x 3   Labs    High Sensitivity Troponin:   Recent Labs  Lab 04/23/23 2000 04/23/23 2145 04/26/23 0757 04/26/23 1058  TROPONINIHS 7 7 20* 21*     Chemistry Recent Labs  Lab 04/23/23 2000 04/24/23 0624 04/26/23 1751 04/27/23 0213 04/28/23 0221  NA 137   < > 134* 134* 134*  K 3.1*   < > 4.0 3.4* 3.8  CL 98   < > 96* 92* 91*  CO2 28   < > 27 31 29   GLUCOSE 128*   < > 170* 133* 126*  BUN 6   < > 8 8 7   CREATININE 0.84   < > 0.84 0.75 0.76  CALCIUM 8.4*   < > 7.7* 7.5* 8.0*  MG  --    < > 2.0 1.7 1.7  PROT 6.4*  --   --   --   --   ALBUMIN 2.9*  --   --  2.5* 2.7*  AST 44*  --   --   --   --   ALT 28  --   --   --   --   ALKPHOS 88  --   --   --   --   BILITOT 1.1   --   --   --   --   GFRNONAA >60   < > >60 >60 >60  ANIONGAP 11   < > 11 11 14    < > = values in this interval not displayed.    Lipids No results for input(s): "CHOL", "TRIG", "HDL", "LABVLDL", "LDLCALC", "CHOLHDL" in the last 168 hours.  Hematology Recent Labs  Lab 04/26/23 0204 04/27/23 0213 04/28/23 0221  WBC 8.6 7.6 7.0  RBC 3.33* 3.47* 3.53*  HGB 8.8* 9.1* 9.3*  HCT 29.2* 30.3* 31.0*  MCV 87.7 87.3 87.8  MCH 26.4 26.2 26.3  MCHC 30.1 30.0 30.0  RDW 17.4* 17.3* 17.7*  PLT 128* 141* 134*   Thyroid  Recent Labs  Lab 04/26/23 0758  TSH 0.867    BNP Recent Labs  Lab 04/23/23 2000 04/26/23 1058  BNP 66.4 169.4*    DDimer No results for input(s): "DDIMER" in the last 168 hours.   Radiology    No results found.   Cardiac Studies     Patient Profile     53 y.o. female with morbid obesity, obesity hypoventilation, high-output congestive heart failure, untreated obstructive sleep apnea and recurrent atrial fibrillation.  Assessment & Plan    1.  Atrial fibrillation: cardioversion is scheduled for today . She has not missed any doses of eliquis   We discussed the risk, benefits, options regarding cardioversion.  She understands and agrees to proceed.  She has had cardioversions before.  2.  Massive obesity:  cont with weight loss efforts   2.  Obstructive sleep apnea.  Does not use CPAP .  Will defer to IM or her primary md\]     For questions or updates, please contact [facility] Please consult [email or web address] for contact info under        Signed, Kristeen Miss, MD  04/28/2023, 8:30 AM

## 2023-04-28 NOTE — Anesthesia Preprocedure Evaluation (Signed)
 Anesthesia Evaluation  Patient identified by MRN, date of birth, ID band Patient awake    Reviewed: Allergy & Precautions, H&P , NPO status , Patient's Chart, lab work & pertinent test results  Airway Mallampati: III  TM Distance: >3 FB Neck ROM: Full    Dental no notable dental hx. (+) Teeth Intact, Dental Advisory Given   Pulmonary asthma , former smoker   Pulmonary exam normal breath sounds clear to auscultation       Cardiovascular hypertension, +CHF  + dysrhythmias Atrial Fibrillation  Rhythm:Irregular Rate:Tachycardia     Neuro/Psych  Headaches  Anxiety Depression Bipolar Disorder      GI/Hepatic Neg liver ROS,GERD  Medicated,,  Endo/Other  diabetes, Type 2  Class 4 obesity  Renal/GU negative Renal ROS  negative genitourinary   Musculoskeletal   Abdominal   Peds  Hematology  (+) Blood dyscrasia, anemia   Anesthesia Other Findings   Reproductive/Obstetrics negative OB ROS                             Anesthesia Physical Anesthesia Plan  ASA: 4  Anesthesia Plan: General   Post-op Pain Management: Minimal or no pain anticipated   Induction: Intravenous  PONV Risk Score and Plan: 3 and Propofol infusion and Treatment may vary due to age or medical condition  Airway Management Planned: Natural Airway and Simple Face Mask  Additional Equipment:   Intra-op Plan:   Post-operative Plan:   Informed Consent: I have reviewed the patients History and Physical, chart, labs and discussed the procedure including the risks, benefits and alternatives for the proposed anesthesia with the patient or authorized representative who has indicated his/her understanding and acceptance.     Dental advisory given  Plan Discussed with: CRNA  Anesthesia Plan Comments:        Anesthesia Quick Evaluation

## 2023-04-28 NOTE — Progress Notes (Signed)

## 2023-04-28 NOTE — Progress Notes (Signed)
 PT Cancellation Note  Patient Details Name: Alyssa Blanchard MRN: [id number] DOB: 12-Nov-1970   Cancelled Treatment:    Reason Eval/Treat Not Completed: Other (comment). Pt declining participation in PT eval stating "I can't do anything until after my cardioversion because they told me not to get up before that because my heart rate goes up." Pt lives sedentary lifestyle with minimal movement at baseline. PT to return as able post cardioversion to complete PT eval.  Alyssa Blanchard, PT, DPT Acute Rehabilitation Services Secure chat preferred Office #: [phone]    Alyssa Blanchard 04/28/2023, 9:42 AM

## 2023-04-28 NOTE — CV Procedure (Signed)
    Electrical Cardioversion Procedure Note Alyssa Blanchard [id number] 05/18/1970  Procedure: Electrical Cardioversion Indications:  Atrial Fibrillation  Time Out: Verified patient identification, verified procedure,medications/allergies/relevent history reviewed, required imaging and test results available.  Performed  Procedure Details  The patient was NPO after midnight. Anesthesia was administered at the beside  by Dr.Fitzgerald with propofol.  Cardioversion was performed with synchronized biphasic defibrillation via AP pads with 200 joules.  1 attempt(s) were performed.  The patient converted to normal sinus rhythm. The patient tolerated the procedure well   IMPRESSION:  Successful cardioversion of atrial fibrillation    Alyssa Blanchard 04/28/2023, 10:52 AM

## 2023-04-29 DIAGNOSIS — I5033 Acute on chronic diastolic (congestive) heart failure: Secondary | ICD-10-CM | POA: Diagnosis not present

## 2023-04-29 DIAGNOSIS — E876 Hypokalemia: Secondary | ICD-10-CM | POA: Diagnosis not present

## 2023-04-29 DIAGNOSIS — N3000 Acute cystitis without hematuria: Secondary | ICD-10-CM | POA: Diagnosis not present

## 2023-04-29 DIAGNOSIS — E66813 Obesity, class 3: Secondary | ICD-10-CM

## 2023-04-29 DIAGNOSIS — I480 Paroxysmal atrial fibrillation: Secondary | ICD-10-CM | POA: Diagnosis not present

## 2023-04-29 DIAGNOSIS — I4819 Other persistent atrial fibrillation: Secondary | ICD-10-CM | POA: Diagnosis not present

## 2023-04-29 LAB — CBC WITH DIFFERENTIAL/PLATELET
Abs Immature Granulocytes: 0.09 10*3/uL — ABNORMAL HIGH (ref 0.00–0.07)
Basophils Absolute: 0.1 10*3/uL (ref 0.0–0.1)
Basophils Relative: 1 %
Eosinophils Absolute: 0.2 10*3/uL (ref 0.0–0.5)
Eosinophils Relative: 3 %
HCT: 29.3 % — ABNORMAL LOW (ref 36.0–46.0)
Hemoglobin: 8.9 g/dL — ABNORMAL LOW (ref 12.0–15.0)
Immature Granulocytes: 1 %
Lymphocytes Relative: 17 %
Lymphs Abs: 1.2 10*3/uL (ref 0.7–4.0)
MCH: 26.7 pg (ref 26.0–34.0)
MCHC: 30.4 g/dL (ref 30.0–36.0)
MCV: 88 fL (ref 80.0–100.0)
Monocytes Absolute: 0.8 10*3/uL (ref 0.1–1.0)
Monocytes Relative: 10 %
Neutro Abs: 5 10*3/uL (ref 1.7–7.7)
Neutrophils Relative %: 68 %
Platelets: 140 10*3/uL — ABNORMAL LOW (ref 150–400)
RBC: 3.33 MIL/uL — ABNORMAL LOW (ref 3.87–5.11)
RDW: 17.7 % — ABNORMAL HIGH (ref 11.5–15.5)
WBC: 7.3 10*3/uL (ref 4.0–10.5)
nRBC: 0 % (ref 0.0–0.2)

## 2023-04-29 LAB — MAGNESIUM: Magnesium: 1.9 mg/dL (ref 1.7–2.4)

## 2023-04-29 LAB — RENAL FUNCTION PANEL
Albumin: 2.6 g/dL — ABNORMAL LOW (ref 3.5–5.0)
Anion gap: 9 (ref 5–15)
BUN: 7 mg/dL (ref 6–20)
CO2: 32 mmol/L (ref 22–32)
Calcium: 8.2 mg/dL — ABNORMAL LOW (ref 8.9–10.3)
Chloride: 93 mmol/L — ABNORMAL LOW (ref 98–111)
Creatinine, Ser: 1.16 mg/dL — ABNORMAL HIGH (ref 0.44–1.00)
GFR, Estimated: 57 mL/min — ABNORMAL LOW (ref >60)
Glucose, Bld: 139 mg/dL — ABNORMAL HIGH (ref 70–99)
Phosphorus: 3.2 mg/dL (ref 2.5–4.6)
Potassium: 3.4 mmol/L — ABNORMAL LOW (ref 3.5–5.1)
Sodium: 134 mmol/L — ABNORMAL LOW (ref 135–145)

## 2023-04-29 LAB — GLUCOSE, CAPILLARY
Glucose-Capillary: 124 mg/dL — ABNORMAL HIGH (ref 70–99)
Glucose-Capillary: 149 mg/dL — ABNORMAL HIGH (ref 70–99)
Glucose-Capillary: 184 mg/dL — ABNORMAL HIGH (ref 70–99)
Glucose-Capillary: 211 mg/dL — ABNORMAL HIGH (ref 70–99)

## 2023-04-29 MED ORDER — POTASSIUM CHLORIDE CRYS ER 20 MEQ PO TBCR
40.0000 meq | EXTENDED_RELEASE_TABLET | Freq: Once | ORAL | Status: AC
  Administered 2023-04-29: 40 meq via ORAL
  Filled 2023-04-29: qty 2

## 2023-04-29 MED ORDER — BUMETANIDE 2 MG PO TABS
2.0000 mg | ORAL_TABLET | Freq: Two times a day (BID) | ORAL | Status: DC
  Administered 2023-04-29 (×2): 2 mg via ORAL
  Filled 2023-04-29 (×3): qty 1

## 2023-04-29 MED ORDER — SILVER SULFADIAZINE 1 % EX CREA
TOPICAL_CREAM | Freq: Every day | CUTANEOUS | Status: DC
  Filled 2023-04-29: qty 85

## 2023-04-29 MED ORDER — POTASSIUM CHLORIDE CRYS ER 20 MEQ PO TBCR: 40.0000 meq | EXTENDED_RELEASE_TABLET | ORAL | Status: DC

## 2023-04-29 NOTE — Assessment & Plan Note (Signed)
 Diarrhea has been improving.  Patient has been on supportive medical therapy.

## 2023-04-29 NOTE — Assessment & Plan Note (Signed)
 Calculated BMI is 62,7

## 2023-04-29 NOTE — Progress Notes (Signed)
  Inpatient Rehabilitation Admissions Coordinator   Met with patient at bedside for rehab assessment. We discussed goals and expectations of a possible CIR admit.  She was previously at [facility] 2023. She prefers CIR for rehab. Mom can provide supervision only. I will begin insurance Auth with [facility] Medicaid [employer] for possible CIR admit pending approval and medical readiness. Please call me with any questions.   Ottie Glazier, RN, MSN Rehab Admissions Coordinator [phone]

## 2023-04-29 NOTE — Assessment & Plan Note (Signed)
 Continue blood pressure control with metoprolol.

## 2023-04-29 NOTE — Assessment & Plan Note (Signed)
 Continue insulin sliding scale for glucose cover and monitoring

## 2023-04-29 NOTE — Assessment & Plan Note (Addendum)
 Echocardiogram with preserved LV systolic function with EF 70 to 75%, mild LVH, RV systolic function preserved, RVSP 38,5 mmHg, normal size atriums, no significant valvular disease.   Urine output is 5,501 ml Systolic blood pressure 130 mmHg range.   Plan to continue metoprolol and spironolactone.  Transitioned to bumetanide 2 mg bid.  Out of bed to chair tid with meals, follow up with Pt and Ot.  Possible transfer to CIR.

## 2023-04-29 NOTE — Assessment & Plan Note (Signed)
 SP cardioversion, she has been on sinus rhythm.  Continue metoprolol and anticoagulation with apixaban.

## 2023-04-29 NOTE — Assessment & Plan Note (Signed)
 Continue with oral iron supplementation Hgb today is 8,9

## 2023-04-29 NOTE — H&P (Signed)
 Physical Medicine and Rehabilitation Admission H&P    Chief Complaint  Patient presents with   Shortness of Breath  : HPI: Alyssa Blanchard is a 53 year old right-handed female well-known to inpatient rehab services with admission 02/28/2022 - 03/11/2022 for debility/morbid obesity with BMI 62.77/right lower extremity cellulitis as well as history of chronic diastolic congestive heart failure, anemia, PAF on Eliquis, asthma, type 2 diabetes mellitus, OSA with CPAP, hypertension, depression/anxiety, quit smoking 11 years ago.  Recent admission at [facility] 03/10/18/2025 - 03/17/2023 for acute on chronic congestive heart failure exacerbation and was diuresed aggressively.  Weight was 206 kg on admission at that time down to 179 kg on discharge.  Per chart review patient lives at home with his mother who is available 24/7.  Mother is able to do light housework.  Patient independent with a cane and driving prior to admission.  5 steps to entry of home.  Presented 04/23/2023 with increasing shortness of breath with progressive fluid buildup lower extremities x 1 week.  Chest x-ray showed mild enlargement of the cardiopericardial silhouette with indistinct pulmonary vascular could not exclude pulmonary venous hypertension.  No overt edema.  Admission chemistries unremarkable except hemoglobin 9.3, BNP 66.4, troponin negative, blood cultures no growth to date.  Echocardiogram ejection fraction of 70 to 75%.  Left ventricle hyperdynamic function.  No evidence of mitral stenosis.  Hospital course complicated by A-fib with RVR follow-up per cardiology services started on diltiazem and amiodarone for control.  Patient did require electrical cardioversion 04/28/2023 per Dr. [name].  She presently remains on Eliquis.  Patient she spiked a fever on 04/25/2023 with nausea and emesis.  GI pathogen panel positive for norovirus.  Blood cultures remain no growth to date, urine culture greater 100,000 E. coli..  Intravenous  Rocephin initiated transition to oral antibiotics.  Right thigh cellulitis.  And again patient continues on Rocephin as noted and again transition to oral antibiotics.  Therapy evaluations completed due to patient decreased functional mobility/debility was admitted for a comprehensive rehab program.  Review of Systems  Constitutional:  Positive for fever.  HENT:  Negative for hearing loss.   Eyes:  Negative for blurred vision and double vision.  Respiratory:  Positive for shortness of breath. Negative for cough and wheezing.   Cardiovascular:  Positive for palpitations and leg swelling.  Gastrointestinal:  Positive for nausea and vomiting.       GERD  Genitourinary:  Positive for urgency. Negative for dysuria, flank pain and hematuria.       Stress urinary incontinence  Musculoskeletal:  Positive for back pain, joint pain and myalgias.  Neurological:  Positive for weakness.  Psychiatric/Behavioral:  Positive for depression.        Anxiety   Past Medical History:  Diagnosis Date   Anxiety    Asthma    Atrial fibrillation (HCC)    Depression    DM (diabetes mellitus), type 2 (HCC) 02/16/2022   Dysrhythmia    new onset Afib rvr   GERD (gastroesophageal reflux disease)    Heart failure (HCC)    Heel spur    bilat   History of bronchitis    History of chicken pox    History of urinary tract infection    Hypertension    Migraines    Plantar fasciitis    bilat   STD (sexually transmitted disease)    chl hx & hsv 1&2   Tremors of nervous system    Urinary incontinence  Past Surgical History:  Procedure Laterality Date   BIOPSY  10/14/2022   Procedure: BIOPSY;  Surgeon: Jenel Lucks, MD;  Location: [facility];  Service: Gastroenterology;;   CARDIOVERSION N/A 08/05/2019   Procedure: CARDIOVERSION;  Surgeon: Vesta Mixer, MD;  Location: [facility] ENDOSCOPY;  Service: Cardiovascular;  Laterality: N/A;   CARDIOVERSION N/A 04/28/2023   Procedure: CARDIOVERSION;  Surgeon:  Jake Bathe, MD;  Location: [facility] INVASIVE CV LAB;  Service: Cardiovascular;  Laterality: N/A;   COLONOSCOPY WITH PROPOFOL N/A 10/14/2022   Procedure: COLONOSCOPY WITH PROPOFOL;  Surgeon: Jenel Lucks, MD;  Location: [facility] ENDOSCOPY;  Service: Gastroenterology;  Laterality: N/A;   ESOPHAGOGASTRODUODENOSCOPY (EGD) WITH PROPOFOL N/A 10/14/2022   Procedure: ESOPHAGOGASTRODUODENOSCOPY (EGD) WITH PROPOFOL;  Surgeon: Jenel Lucks, MD;  Location: [facility] ENDOSCOPY;  Service: Gastroenterology;  Laterality: N/A;   LAPAROSCOPIC GASTRIC SLEEVE RESECTION N/A 11/27/2014   Procedure: LAPAROSCOPIC GASTRIC SLEEVE RESECTION WITH HIATAL HERNIA REPAIR UPPER ENDOSCOPY;  Surgeon: Luretha Murphy, MD;  Location: [facility] ORS;  Service: General;  Laterality: N/A;   POLYPECTOMY  10/14/2022   Procedure: POLYPECTOMY;  Surgeon: Jenel Lucks, MD;  Location: [facility] ENDOSCOPY;  Service: Gastroenterology;;   TEE WITHOUT CARDIOVERSION N/A 08/05/2019   Procedure: TRANSESOPHAGEAL ECHOCARDIOGRAM (TEE);  Surgeon: [name], Deloris Ping, MD;  Location: [facility] ENDOSCOPY;  Service: Cardiovascular;  Laterality: N/A;   TONSILLECTOMY  1978   Family History  Problem Relation Age of Onset   Diabetes Mother    Hypertension Mother    Hyperlipidemia Mother    Kidney disease Mother        CKD stage 4   Pancreatic cancer Father        pancreatic   Hypertension Maternal Grandmother    Diabetes Maternal Grandmother    Heart failure Maternal Grandmother        CHF   Heart attack Maternal Grandfather    Hypertension Maternal Grandfather    Hypertension Paternal Grandmother    Alzheimer's disease Paternal Grandmother    Hypertension Paternal Grandfather    Cancer Maternal Uncle        melanoma   Cancer Paternal Uncle        kidney   Colon cancer Neg Hx    Esophageal cancer Neg Hx    Stomach cancer Neg Hx    Social History:  reports that she quit smoking about 11 years ago. Her smoking use included cigarettes. She started smoking about 16 years  ago. She has a 1.3 pack-year smoking history. She has never used smokeless tobacco. She reports that she does not currently use alcohol. She reports that she does not currently use drugs after having used the following drugs: Marijuana and Cocaine. Allergies:  Allergies  Allergen Reactions   Gabapentin Anaphylaxis, Nausea And Vomiting and Other (See Comments)    "Cannot walk"   Topiramate Other (See Comments)    Jaw tightness and chest discomfort    Albuterol Other (See Comments)    CAN tolerate levalbuterol   Hydroxyzine Other (See Comments)    QT prolongation related   Imitrex [Sumatriptan] Nausea And Vomiting and Other (See Comments)    Increased heart rate   Latex Itching   Montelukast Other (See Comments)    Nightmares    Other Itching and Other (See Comments)    Plastic and artificial Christmas trees = Itching   Prozac [Fluoxetine] Other (See Comments)    Bad, vivid dreams   Trazodone And Nefazodone Other (See Comments)    Bad dreams 01/27/20 Pt is unsure  if allergic to Nefazodone   Advair Diskus [Fluticasone-Salmeterol] Palpitations   Copper-Containing Compounds Rash and Other (See Comments)    Makes the face burn, also   Medications Prior to Admission  Medication Sig Dispense Refill   acetaminophen (TYLENOL) 500 MG tablet Take 1,000 mg by mouth See admin instructions. Take 1,000 mg by mouth in the morning and at bedtime and an additional 1,000 mg once a day as needed for pain     apixaban (ELIQUIS) 5 MG TABS tablet Take 1 tablet (5 mg total) by mouth 2 (two) times daily. 60 tablet 0   bumetanide (BUMEX) 1 MG tablet Take 1 tablet (1 mg total) by mouth daily. (Patient taking differently: Take 1-2 mg by mouth See admin instructions. Take 2 tablets by mouth in the morning and 1 tablet at night) 30 tablet 0   DULERA 200-5 MCG/ACT AERO Inhale 2 puffs into the lungs in the morning and at bedtime.     Dupilumab (DUPIXENT) 300 MG/2ML SOPN Inject 300 mg into the skin every 14  (fourteen) days.     iron polysaccharides (NIFEREX) 150 MG capsule Take 1 capsule by mouth daily.     Lifitegrast (XIIDRA) 5 % SOLN Place 1 drop into both eyes in the morning and at bedtime.     magnesium oxide (MAG-OX) 400 MG tablet Take 400 mg by mouth daily.     Melatonin 10 MG TABS Take 10 mg by mouth at bedtime.     Multiple Vitamins-Minerals (BARIATRIC MULTIVITAMINS/IRON PO) Take 1 tablet by mouth daily with breakfast.     nystatin (MYCOSTATIN/NYSTOP) powder Apply topically 2 (two) times daily. (Patient taking differently: Apply 1 Application topically daily.) 15 g 0   pantoprazole (PROTONIX) 40 MG tablet Take 1 tablet (40 mg total) by mouth 2 (two) times daily. 60 tablet 0   potassium chloride SA (KLOR-CON M) 20 MEQ tablet Take 1 tablet (20 mEq total) by mouth daily for 15 days. (Patient taking differently: Take 40 mEq by mouth daily.) 15 tablet 0   pregabalin (LYRICA) 100 MG capsule Take 1 capsule (100 mg total) by mouth 2 (two) times daily. (Patient taking differently: Take 200 mg by mouth 2 (two) times daily.) 60 capsule 0   propranolol (INDERAL) 80 MG tablet Take 1 tablet by mouth 3 (three) times daily.     scopolamine (TRANSDERM-SCOP) 1 MG/3DAYS Place 1 patch (1.5 mg total) onto the skin every 3 (three) days. (Patient taking differently: Place 1 patch onto the skin every three (3) days as needed (for nausea).) 10 patch 0   silver sulfADIAZINE (SILVADENE) 1 % cream Apply 1 Application topically daily.     spironolactone (ALDACTONE) 25 MG tablet Take 1 tablet (25 mg total) by mouth daily. (Patient taking differently: Take 25 mg by mouth in the morning.) 30 tablet 0   VENTOLIN HFA 108 (90 Base) MCG/ACT inhaler 2 puffs every 6 (six) hours as needed for shortness of breath or wheezing.     VOLTAREN ARTHRITIS PAIN 1 % GEL Apply 2 g topically See admin instructions. Apply 2 grams to affected areas in the morning and at bedtime     ZYRTEC ALLERGY 10 MG tablet Take 10 mg by mouth daily as needed  (for seasonal allergies).     fluticasone furoate-vilanterol (BREO ELLIPTA) 200-25 MCG/ACT AEPB Inhale 1 puff into the lungs daily. (Patient not taking: Reported on 04/24/2023) 60 each 0   Semaglutide,0.25 or 0.5MG /DOS, (OZEMPIC, 0.25 OR 0.5 MG/DOSE,) 2 MG/3ML SOPN Inject 0.5 mg into  the skin every Wednesday. (Patient not taking: Reported on 07/23/2022)        Home: Home Living Family/patient expects to be discharged to:: Private residence Living Arrangements: Parent Available Help at Discharge: Family Type of Home: House Home Access: Stairs to enter Secretary/administrator of Steps: 5 Entrance Stairs-Rails: Can reach both Home Layout: One level, Other (Comment) (has steps up/down to restroom and living room) Bathroom Shower/Tub: Tub/shower unit Home Equipment: Information systems manager, Agricultural consultant (2 wheels), Cane - single point, BSC/3in1, Hospital bed Additional Comments: Pt sleeps in hospital bed which is currently broken, says she needs a new one. Lives with mother who is able to do light housework.   Functional History: Prior Function Prior Level of Function : Independent/Modified Independent Mobility Comments: occasionally uses cane, mother assists RLE into bed sometimes ADLs Comments: Mother helps get clothes out of closet, independent with dressing/bathing.  Functional Status:  Mobility: Bed Mobility Overal bed mobility: Needs Assistance Bed Mobility: Supine to Sit, Sit to Supine Supine to sit: Contact guard Sit to supine: Contact guard assist General bed mobility comments: CGA, increased time, use of bed rails and HOB elevated Transfers Overall transfer level: Needs assistance Equipment used: Rolling walker (2 wheels) Transfers: Sit to/from Stand Sit to Stand: Contact guard assist, Min assist General transfer comment: CGA for safety and MIN A for balance in standing and to adjust feet for improved balance due to wide BOS. Verbal cues for safe hand placement with standing to  prevent pulling up from RW. Ambulation/Gait Ambulation/Gait assistance: Contact guard assist Gait Distance (Feet): 10 Feet Assistive device: Rolling walker (2 wheels) Gait Pattern/deviations: Step-through pattern, Decreased stride length, Trunk flexed, Wide base of support General Gait Details: short partial step through gait pattern with flexed trunk, increased BOS and standing rest break to brush teeth between ambulation. Pt was limited due to pt requires a bariatric RW Gait velocity: decreased Gait velocity interpretation: <1.31 ft/sec, indicative of household ambulator    ADL: ADL Overall ADL's : Needs assistance/impaired Eating/Feeding: Independent Grooming: Set up, Sitting Upper Body Bathing: Minimal assistance, With adaptive equipment, Sitting Lower Body Bathing: Moderate assistance, With adaptive equipment, Sitting/lateral leans Upper Body Dressing : Minimal assistance, Sitting Lower Body Dressing: Maximal assistance, Sit to/from stand Toilet Transfer: Contact guard assist, +2 for safety/equipment, Stand-pivot, BSC/3in1, Rolling walker (2 wheels) Toileting- Clothing Manipulation and Hygiene: Moderate assistance, Sitting/lateral lean General ADL Comments: Pt states due to edema LB ADLs have been more difficult, PLOF independent. Pt has good overall strength, able to stand at bedside CGA,  Cognition: Cognition Orientation Level: Oriented X4 Cognition Arousal: Alert Behavior During Therapy: WFL for tasks assessed/performed  Physical Exam: Blood pressure 134/60, pulse 90, temperature 99 F (37.2 C), temperature source Oral, resp. rate 16, height 5\' 5"  (1.651 m), weight (!) 171.1 kg, last menstrual period 12/20/2017, SpO2 96%. Physical Exam Neurological:     Comments: Patient is alert.  Sitting up in bed.  Oriented x 3.     Results for orders placed or performed during the hospital encounter of 04/23/23 (from the past 48 hours)  Glucose, capillary     Status: Abnormal    Collection Time: 04/27/23 11:18 AM  Result Value Ref Range   Glucose-Capillary 167 (H) 70 - 99 mg/dL    Comment: Glucose reference range applies only to samples taken after fasting for at least 8 hours.  Glucose, capillary     Status: Abnormal   Collection Time: 04/27/23  4:02 PM  Result Value Ref Range  Glucose-Capillary 139 (H) 70 - 99 mg/dL    Comment: Glucose reference range applies only to samples taken after fasting for at least 8 hours.  Glucose, capillary     Status: Abnormal   Collection Time: 04/27/23 10:01 PM  Result Value Ref Range   Glucose-Capillary 161 (H) 70 - 99 mg/dL    Comment: Glucose reference range applies only to samples taken after fasting for at least 8 hours.   Comment 1 Notify RN    Comment 2 Document in Chart   Magnesium     Status: None   Collection Time: 04/28/23  2:21 AM  Result Value Ref Range   Magnesium 1.7 1.7 - 2.4 mg/dL    Comment: Performed at [facility], [address], [place], [place] [address]  CBC with Differential/Platelet     Status: Abnormal   Collection Time: 04/28/23  2:21 AM  Result Value Ref Range   WBC 7.0 4.0 - 10.5 K/uL   RBC 3.53 (L) 3.87 - 5.11 MIL/uL   Hemoglobin 9.3 (L) 12.0 - 15.0 g/dL   HCT 60.4 (L) 54.0 - 98.1 %   MCV 87.8 80.0 - 100.0 fL   MCH 26.3 26.0 - 34.0 pg   MCHC 30.0 30.0 - 36.0 g/dL   RDW 19.1 (H) 47.8 - 29.5 %   Platelets 134 (L) 150 - 400 K/uL   nRBC 0.0 0.0 - 0.2 %   Neutrophils Relative % 63 %   Neutro Abs 4.4 1.7 - 7.7 K/uL   Lymphocytes Relative 19 %   Lymphs Abs 1.3 0.7 - 4.0 K/uL   Monocytes Relative 14 %   Monocytes Absolute 1.0 0.1 - 1.0 K/uL   Eosinophils Relative 3 %   Eosinophils Absolute 0.2 0.0 - 0.5 K/uL   Basophils Relative 0 %   Basophils Absolute 0.0 0.0 - 0.1 K/uL   Immature Granulocytes 1 %   Abs Immature Granulocytes 0.07 0.00 - 0.07 K/uL    Comment: Performed at [facility], [address], [place], [place] [address]  Renal function panel     Status: Abnormal    Collection Time: 04/28/23  2:21 AM  Result Value Ref Range   Sodium 134 (L) 135 - 145 mmol/L   Potassium 3.8 3.5 - 5.1 mmol/L   Chloride 91 (L) 98 - 111 mmol/L   CO2 29 22 - 32 mmol/L   Glucose, Bld 126 (H) 70 - 99 mg/dL    Comment: Glucose reference range applies only to samples taken after fasting for at least 8 hours.   BUN 7 6 - 20 mg/dL   Creatinine, Ser 8.65 0.44 - 1.00 mg/dL   Calcium 8.0 (L) 8.9 - 10.3 mg/dL   Phosphorus 3.3 2.5 - 4.6 mg/dL   Albumin 2.7 (L) 3.5 - 5.0 g/dL   GFR, Estimated >78 >46 mL/min    Comment: (NOTE) Calculated using the CKD-EPI Creatinine Equation (2021)    Anion gap 14 5 - 15    Comment: Performed at [facility], [address], [place], [place] [address]  Glucose, capillary     Status: Abnormal   Collection Time: 04/28/23  6:16 AM  Result Value Ref Range   Glucose-Capillary 139 (H) 70 - 99 mg/dL    Comment: Glucose reference range applies only to samples taken after fasting for at least 8 hours.   Comment 1 Document in Chart   Glucose, capillary     Status: Abnormal   Collection Time: 04/28/23  12:13 PM  Result Value Ref Range   Glucose-Capillary 153 (H) 70 - 99 mg/dL    Comment: Glucose reference range applies only to samples taken after fasting for at least 8 hours.  Glucose, capillary     Status: Abnormal   Collection Time: 04/28/23  4:07 PM  Result Value Ref Range   Glucose-Capillary 170 (H) 70 - 99 mg/dL    Comment: Glucose reference range applies only to samples taken after fasting for at least 8 hours.  Glucose, capillary     Status: Abnormal   Collection Time: 04/28/23  9:32 PM  Result Value Ref Range   Glucose-Capillary 133 (H) 70 - 99 mg/dL    Comment: Glucose reference range applies only to samples taken after fasting for at least 8 hours.   Comment 1 Notify RN    Comment 2 Document in Chart   Magnesium     Status: None   Collection Time: 04/29/23  2:25 AM  Result Value Ref Range   Magnesium 1.9 1.7 - 2.4 mg/dL     Comment: Performed at [facility], [address] [place], [place] [address]  CBC with Differential/Platelet     Status: Abnormal   Collection Time: 04/29/23  2:25 AM  Result Value Ref Range   WBC 7.3 4.0 - 10.5 K/uL   RBC 3.33 (L) 3.87 - 5.11 MIL/uL   Hemoglobin 8.9 (L) 12.0 - 15.0 g/dL   HCT 54.0 (L) 98.1 - 19.1 %   MCV 88.0 80.0 - 100.0 fL   MCH 26.7 26.0 - 34.0 pg   MCHC 30.4 30.0 - 36.0 g/dL   RDW 47.8 (H) 29.5 - 62.1 %   Platelets 140 (L) 150 - 400 K/uL   nRBC 0.0 0.0 - 0.2 %   Neutrophils Relative % 68 %   Neutro Abs 5.0 1.7 - 7.7 K/uL   Lymphocytes Relative 17 %   Lymphs Abs 1.2 0.7 - 4.0 K/uL   Monocytes Relative 10 %   Monocytes Absolute 0.8 0.1 - 1.0 K/uL   Eosinophils Relative 3 %   Eosinophils Absolute 0.2 0.0 - 0.5 K/uL   Basophils Relative 1 %   Basophils Absolute 0.1 0.0 - 0.1 K/uL   Immature Granulocytes 1 %   Abs Immature Granulocytes 0.09 (H) 0.00 - 0.07 K/uL    Comment: Performed at [facility], [address] [place], [place] [address]  Renal function panel     Status: Abnormal   Collection Time: 04/29/23  2:25 AM  Result Value Ref Range   Sodium 134 (L) 135 - 145 mmol/L   Potassium 3.4 (L) 3.5 - 5.1 mmol/L   Chloride 93 (L) 98 - 111 mmol/L   CO2 32 22 - 32 mmol/L   Glucose, Bld 139 (H) 70 - 99 mg/dL    Comment: Glucose reference range applies only to samples taken after fasting for at least 8 hours.   BUN 7 6 - 20 mg/dL   Creatinine, Ser 7.84 (H) 0.44 - 1.00 mg/dL   Calcium 8.2 (L) 8.9 - 10.3 mg/dL   Phosphorus 3.2 2.5 - 4.6 mg/dL   Albumin 2.6 (L) 3.5 - 5.0 g/dL   GFR, Estimated 57 (L) >60 mL/min    Comment: (NOTE) Calculated using the CKD-EPI Creatinine Equation (2021)    Anion gap 9 5 - 15    Comment: Performed at [facility], [address] [place], [place] [address]  Glucose, capillary     Status:  Abnormal   Collection Time: 04/29/23  6:31 AM  Result Value Ref Range   Glucose-Capillary 124 (H) 70 - 99 mg/dL     Comment: Glucose reference range applies only to samples taken after fasting for at least 8 hours.   Comment 1 Notify RN    Comment 2 Document in Chart    EP STUDY Result Date: 04/28/2023 See surgical note for result.     Blood pressure 134/60, pulse 90, temperature 99 F (37.2 C), temperature source Oral, resp. rate 16, height 5\' 5"  (1.651 m), weight (!) 171.1 kg, last menstrual period 12/20/2017, SpO2 96%.  Medical Problem List and Plan: 1. Functional deficits secondary to debility related to acute on chronic congestive heart failure with exacerbation  -patient may *** shower  -ELOS/Goals: *** 2.  Antithrombotics: -DVT/anticoagulation:  Pharmaceutical: Eliquis  -antiplatelet therapy: N/A 3. Pain Management: Voltaren gel twice daily, Lyrica 200 mg twice daily, hydrocodone as needed 4. Mood/Behavior/Sleep: Melatonin 10 mg nightly  -antipsychotic agents: N/A 5. Neuropsych/cognition: This patient is capable of making decisions on her own behalf. 6. Skin/Wound Care: Routine skin checks 7. Fluids/Electrolytes/Nutrition: Routine in and outs with follow-up chemistries 8.  PAF/A-fib with RVR.  Cardioversion 04/28/2023.  Cardiac rate controlled.  Lopressor 25 mg 3 times daily 9.  E. coli UTI.  Completing course of Rocephin 10.  Right thigh cellulitis.  Completing course of Rocephin 11.  Gastroenteritis secondary to norovirus/diarrhea.  Improving.  Supportive care 12.  Asthma.  Continue inhalers as directed check oxygen saturations every shift 13.  OSA.  CPAP 14.  Acute on chronic CHF exacerbation.  Monitor for any signs of fluid overload.  Continue Bumex 2 mg twice daily, Aldactone 25 mg daily 15.  Acute on chronic anemia.  Niferex daily.  Check CBC 16.  Diabetes mellitus.  Latest hemoglobin A1c 6.7.  Presently on SSI.  Patient on Ozempic prior to admission. 17.  Obesity.  BMI 62.77.  Dietary follow-up Charlton Amor, PA-C 04/29/2023

## 2023-04-29 NOTE — Assessment & Plan Note (Signed)
No signs of acute exacerbation. Continue bronchodilator therapy.

## 2023-04-29 NOTE — TOC Progression Note (Signed)
 Transition of Care [facility]) - Progression Note    Patient Details  Name: Alyssa Blanchard MRN: [id number] Date of Birth: [birth year]  [facility] CM/SW Contact  Harriet Masson, RN Phone Number: 04/29/2023, 11:35 AM  Clinical Narrative:    CIR following.     Expected Discharge Plan: OP Rehab Barriers to Discharge: Continued Medical Work up  Expected Discharge Plan and Services       Living arrangements for the past 2 months: Single Family Home                                       Social Determinants of Health (SDOH) Interventions SDOH Screenings   Food Insecurity: No Food Insecurity (04/24/2023)  Housing: Low Risk  (04/24/2023)  Transportation Needs: Unmet Transportation Needs (04/24/2023)  Utilities: Not At Risk (04/24/2023)  Alcohol Screen: Low Risk  (11/24/2022)  Financial Resource Strain: Patient Declined (11/24/2022)  Physical Activity: Inactive (01/29/2023)   Received from [facility]  Social Connections: Socially Isolated (11/24/2022)  Stress: Stress Concern Present (11/24/2022)  Tobacco Use: Medium Risk (04/28/2023)    Readmission Risk Interventions    02/28/2022   11:05 AM  Readmission Risk Prevention Plan  Transportation Screening Complete  PCP or Specialist Appt within 5-7 Days Complete  Home Care Screening Complete  Medication Review (RN CM) Complete

## 2023-04-29 NOTE — Progress Notes (Signed)
 Rounding Note    Patient Name: Alyssa Blanchard Date of Encounter: 04/29/2023  [facility] Cardiologist: Chilton Si, MD   Subjective   53 year old female with history of atrial fibrillation with rapid ventricular response. She has been started on diltiazem and amiodarone for rate control. She has a history of untreated obstructive sleep apnea/obesity hypoventilation with pulmonary hypertension.  RV systolic pressure was 39 mmHg by echo. She appears to have high output heart failure.  She was thought to have diastolic heart failure although her diastolic function is normal    She had a successful cardioversion yesterday   She has diuresed 11.8 liters so far during this admission.  Feeling better      Inpatient Medications    Scheduled Meds:  apixaban  5 mg Oral BID   diclofenac Sodium  2 g Topical BID   furosemide  80 mg Intravenous BID   insulin aspart  0-5 Units Subcutaneous QHS   insulin aspart  0-9 Units Subcutaneous TID WC   iron polysaccharides  150 mg Oral Daily   magnesium oxide  400 mg Oral Daily   melatonin  10 mg Oral QHS   metoprolol tartrate  25 mg Oral TID   mometasone-formoterol  1 puff Inhalation Daily   pantoprazole  40 mg Oral BID   potassium chloride  40 mEq Oral Daily   pregabalin  200 mg Oral BID   sodium chloride flush  3 mL Intravenous Q12H   spironolactone  25 mg Oral Daily   Continuous Infusions:  cefTRIAXone (ROCEPHIN)  IV 2 g (04/27/23 1125)   PRN Meds: acetaminophen **OR** acetaminophen, bisacodyl, HYDROcodone-acetaminophen, levalbuterol, morphine injection, mouth rinse, prochlorperazine, senna-docusate   Vital Signs    Vitals:   04/28/23 2333 04/29/23 0444 04/29/23 0630 04/29/23 0718  BP: (!) 102/59 115/61  134/60  Pulse: 85 70    Resp: 16 15    Temp: 97.8 F (36.6 C) 98.7 F (37.1 C)  99 F (37.2 C)  TempSrc: Oral Oral  Oral  SpO2: 91% 96%    Weight:   (!) 171.1 kg   Height:        Intake/Output Summary  (Last 24 hours) at 04/29/2023 7829 Last data filed at 04/29/2023 0719 Gross per 24 hour  Intake 480 ml  Output 5951 ml  Net -5471 ml      04/29/2023    6:30 AM 04/28/2023    3:39 AM 04/27/2023    4:22 AM  Last 3 Weights  Weight (lbs) 377 lb 3.3 oz 409 lb 2.8 oz 422 lb 13.5 oz  Weight (kg) 171.1 kg 185.6 kg 191.8 kg      Telemetry    Atrial fib with RVR   Personally Reviewed  ECG     - Personally Reviewed  Physical Exam    Physical Exam: Blood pressure 134/60, pulse 70, temperature 99 F (37.2 C), temperature source Oral, resp. rate 15, height 5\' 5"  (1.651 m), weight (!) 171.1 kg, last menstrual period 12/20/2017, SpO2 96%.       GEN:  morbidly obese female  HEENT: Normal NECK: No JVD; No carotid bruits LYMPHATICS: No lymphadenopathy CARDIAC: RRR   RESPIRATORY:  Clear to auscultation without rales, wheezing or rhonchi  ABDOMEN: Soft, non-tender, non-distended MUSCULOSKELETAL: less edema,  still has pockets of lymphedema in her legs  SKIN: Warm and dry NEUROLOGIC:  Alert and oriented x 3   Labs    High Sensitivity Troponin:   Recent Labs  Lab 04/23/23 2000  04/23/23 2145 04/26/23 0757 04/26/23 1058  TROPONINIHS 7 7 20* 21*     Chemistry Recent Labs  Lab 04/23/23 2000 04/24/23 0624 04/27/23 0213 04/28/23 0221 04/29/23 0225  NA 137   < > 134* 134* 134*  K 3.1*   < > 3.4* 3.8 3.4*  CL 98   < > 92* 91* 93*  CO2 28   < > 31 29 32  GLUCOSE 128*   < > 133* 126* 139*  BUN 6   < > 8 7 7   CREATININE 0.84   < > 0.75 0.76 1.16*  CALCIUM 8.4*   < > 7.5* 8.0* 8.2*  MG  --    < > 1.7 1.7 1.9  PROT 6.4*  --   --   --   --   ALBUMIN 2.9*  --  2.5* 2.7* 2.6*  AST 44*  --   --   --   --   ALT 28  --   --   --   --   ALKPHOS 88  --   --   --   --   BILITOT 1.1  --   --   --   --   GFRNONAA >60   < > >60 >60 57*  ANIONGAP 11   < > 11 14 9    < > = values in this interval not displayed.    Lipids No results for input(s): "CHOL", "TRIG", "HDL", "LABVLDL",  "LDLCALC", "CHOLHDL" in the last 168 hours.  Hematology Recent Labs  Lab 04/27/23 0213 04/28/23 0221 04/29/23 0225  WBC 7.6 7.0 7.3  RBC 3.47* 3.53* 3.33*  HGB 9.1* 9.3* 8.9*  HCT 30.3* 31.0* 29.3*  MCV 87.3 87.8 88.0  MCH 26.2 26.3 26.7  MCHC 30.0 30.0 30.4  RDW 17.3* 17.7* 17.7*  PLT 141* 134* 140*   Thyroid  Recent Labs  Lab 04/26/23 0758  TSH 0.867    BNP Recent Labs  Lab 04/23/23 2000 04/26/23 1058  BNP 66.4 169.4*    DDimer No results for input(s): "DDIMER" in the last 168 hours.   Radiology    EP STUDY Result Date: 04/28/2023 See surgical note for result.    Cardiac Studies     Patient Profile     53 y.o. female with morbid obesity, obesity hypoventilation, high-output congestive heart failure, untreated obstructive sleep apnea and recurrent atrial fibrillation.  Assessment & Plan    1.  Atrial fibrillation:  s/p successful cardioversion.  Continue Eliquis at current dose.  2.  Massive obesity: Continue with weight loss efforts.  3.  Obstructive sleep apnea.  Does not use CPAP .  Will defer to IM or her primary md.  4.  Acute on chronic diastolic congestive heart failure: She is doing quite a bit better.  I suspect that she can be put on her home diuretics and potassium supplement today .   Her creatinine is up slighly . Marland Kitchen  She was on Bumex 2 mg in the am, 1 mg in the PM , continue potassium chloride 40 meq a day ,  spironolactone 25 mg a day .  She needs to pay more attention to her salt intake.  She is looking to go to inpatient rehab which I think is a good idea.    [facility] will sign off.   Medication Recommendations:  as above,  change to oral home diuretics   Other recommendations (labs, testing, etc):    Follow up as an outpatient:  with Dr. Duke Salvia    For questions or updates, please contact [facility] Please consult [email or web address] for contact info under        Signed, Kristeen Miss, MD  04/29/2023, 8:22  AM

## 2023-04-29 NOTE — PMR Pre-admission (Signed)
 PMR Admission Coordinator Pre-Admission Assessment  Patient: Alyssa Blanchard is an 53 y.o., female MRN: [id number] DOB: Jun 30, 1970 Height: 5\' 5"  (165.1 cm) Weight: (!) 171.1 kg  Insurance Information HMO:     PPO:      PCP:      IPA:      80/20:      OTHER:  PRIMARY: Center Medicaid [facility]      Policy#: [id number]      Subscriber: pt CM Name: [name]      Phone#: [phone]     Fax#: [phone] Pre-Cert#: [id number] approved ** until **      Employer:  Benefits:  Phone #: [phone] and online [email or web address]     Name: 2/19 Eff. Date: 09/08/22 until 09/07/23     Deduct: none      Out of Pocket Max: none      Life Max: none CIR: 100% per medicaid      SNF: 100% per medicaid, first 90 days covered by payor then members move to [facility] Outpatient: 100%     Co-Pay: 27 visits per year combined Home Health: 100%      Co-Pay: 100 total visits DME: 100%     Co-Pay: per medical neccesity Providers: in network  SECONDARY: none      Policy#:      Phone#:   Artist:       Phone#:   The Data processing manager" for patients in Inpatient Rehabilitation Facilities with attached "Privacy Act Statement-Health Care Records" was provided and verbally reviewed with: Patient  Emergency Contact Information Contact Information     Name Relation Home Work Mobile   Alyssa Blanchard Mother [phone]        Other Contacts   None on File    Current Medical History  Patient Admitting Diagnosis: Debility  History of Present Illness:  53 year old right-handed female well-known to inpatient rehab services with admission 02/28/2022 - 03/11/2022 for debility/morbid obesity with BMI 62.77, right lower extremity cellulitis as well as history of chronic diastolic congestive heart failure, anemia, PAF on Eliquis, asthma, type 2 diabetes mellitus, OSA with CPAP, hypertension, depression/anxiety, quit smoking 11 years ago.  Recent admission at [facility] 03/10/18/2025 -  03/17/2023 for acute on chronic congestive heart failure exacerbation and was diuresed aggressively.  Weight was 206 kg on admission at that time down to 179 kg on discharge.Presented 04/23/2023 with increasing shortness of breath with progressive fluid buildup lower extremities x 1 week.    Chest x-ray showed mild enlargement of the cardiopericardial silhouette with indistinct pulmonary vascular could not exclude pulmonary venous hypertension.  No overt edema.  Admission chemistries unremarkable except hemoglobin 9.3, BNP 66.4, troponin negative, blood cultures no growth to date.  Echocardiogram ejection fraction of 70 to 75%.  Left ventricle hyperdynamic function.  No evidence of mitral stenosis.  Hospital course complicated by Blanchard-fib with RVR follow-up per cardiology services started on diltiazem and amiodarone for control.  Patient did require electrical cardioversion 04/28/2023 per Dr. [name].  She presently remains on Eliquis.  Patient she spiked Blanchard fever on 04/25/2023 with nausea and emesis.  GI pathogen panel positive for norovirus.  Blood cultures remain no growth to date, urine culture greater 100,000 E. coli..  Intravenous Rocephin initiated transition to oral antibiotics.  Right thigh cellulitis.  And again patient continues on Rocephin as noted and again transition to oral antibiotics.   Patient[facility] has been reviewed by the rehabilitation admission  coordinator and physician.  Past Medical History  Past Medical History:  Diagnosis Date   Anxiety    Asthma    Atrial fibrillation (HCC)    Depression    DM (diabetes mellitus), type 2 (HCC) 02/16/2022   Dysrhythmia    new onset Afib rvr   GERD (gastroesophageal reflux disease)    Heart failure (HCC)    Heel spur    bilat   History of bronchitis    History of chicken pox    History of urinary tract infection    Hypertension    Migraines    Plantar fasciitis    bilat   STD (sexually transmitted  disease)    chl hx & hsv 1&2   Tremors of nervous system    Urinary incontinence    Has the patient had major surgery during 100 days prior to admission? No  Family History   family history includes Alzheimer's disease in her paternal grandmother; Cancer in her maternal uncle and paternal uncle; Diabetes in her maternal grandmother and mother; Heart attack in her maternal grandfather; Heart failure in her maternal grandmother; Hyperlipidemia in her mother; Hypertension in her maternal grandfather, maternal grandmother, mother, paternal grandfather, and paternal grandmother; Kidney disease in her mother; Pancreatic cancer in her father.  Current Medications  Current Facility-Administered Medications:    acetaminophen (TYLENOL) tablet 650 mg, 650 mg, Oral, Q6H PRN, 650 mg at 04/28/23 0658 **OR** acetaminophen (TYLENOL) suppository 650 mg, 650 mg, Rectal, Q6H PRN, Charlsie Quest, MD   apixaban (ELIQUIS) tablet 5 mg, 5 mg, Oral, BID, Darreld Mclean R, MD, 5 mg at 04/29/23 6295   bisacodyl (DULCOLAX) EC tablet 5 mg, 5 mg, Oral, Daily PRN, Charlsie Quest, MD   bumetanide (BUMEX) tablet 2 mg, 2 mg, Oral, BID, Nahser, Deloris Ping, MD, 2 mg at 04/29/23 1045   diclofenac Sodium (VOLTAREN) 1 % topical gel 2 g, 2 g, Topical, BID, Rodolph Bong, MD, 2 g at 04/29/23 0840   HYDROcodone-acetaminophen (NORCO) 10-325 MG per tablet 1-2 tablet, 1-2 tablet, Oral, Q6H PRN, Darreld Mclean R, MD, 1 tablet at 04/29/23 0838   insulin aspart (novoLOG) injection 0-5 Units, 0-5 Units, Subcutaneous, QHS, Patel, Vishal R, MD   insulin aspart (novoLOG) injection 0-9 Units, 0-9 Units, Subcutaneous, TID WC, Darreld Mclean R, MD, 1 Units at 04/29/23 1228   iron polysaccharides (NIFEREX) capsule 150 mg, 150 mg, Oral, Daily, Rodolph Bong, MD, 150 mg at 04/25/23 2841   levalbuterol (XOPENEX) nebulizer solution 0.63 mg, 0.63 mg, Inhalation, Q6H PRN, Darreld Mclean R, MD   magnesium oxide (MAG-OX) tablet 400 mg, 400 mg, Oral,  Daily, Rodolph Bong, MD, 400 mg at 04/29/23 3244   melatonin tablet 10 mg, 10 mg, Oral, QHS, Rodolph Bong, MD, 10 mg at 04/28/23 2155   metoprolol tartrate (LOPRESSOR) tablet 25 mg, 25 mg, Oral, TID, Weston Brass A, MD, 25 mg at 04/29/23 0838   mometasone-formoterol (DULERA) 200-5 MCG/ACT inhaler 1 puff, 1 puff, Inhalation, Daily, Darreld Mclean R, MD, 1 puff at 04/29/23 0858   morphine (PF) 2 MG/ML injection 2 mg, 2 mg, Intravenous, Q3H PRN, Darreld Mclean R, MD, 2 mg at 04/24/23 0122   Oral care mouth rinse, 15 mL, Mouth Rinse, PRN, Rodolph Bong, MD   pantoprazole (PROTONIX) EC tablet 40 mg, 40 mg, Oral, BID, Pham, Minh Q, RPH-CPP, 40 mg at 04/29/23 0838   pregabalin (LYRICA) capsule 200 mg, 200 mg, Oral, BID, Rodolph Bong, MD, 200 mg  at 04/29/23 0102   prochlorperazine (COMPAZINE) injection 10 mg, 10 mg, Intravenous, Q6H PRN, Charlsie Quest, MD, 10 mg at 04/26/23 0308   senna-docusate (Senokot-S) tablet 1 tablet, 1 tablet, Oral, QHS PRN, Charlsie Quest, MD   silver sulfADIAZINE (SILVADENE) 1 % cream, , Topical, Daily, Arrien, York Ram, MD, Given at 04/29/23 1046   sodium chloride flush (NS) 0.9 % injection 3 mL, 3 mL, Intravenous, Q12H, Darreld Mclean R, MD, 3 mL at 04/29/23 0841   spironolactone (ALDACTONE) tablet 25 mg, 25 mg, Oral, Daily, Abagail Kitchens, PA-C, 25 mg at 04/29/23 7253  Facility-Administered Medications Ordered in Other Encounters:    acetaminophen (TYLENOL) tablet 650 mg, 650 mg, Oral, Once, Thayil, Irene T, PA-C   ferric derisomaltose (MONOFERRIC) 1,000 mg in sodium chloride 0.9 % 100 mL infusion, 1,000 mg, Intravenous, Once, Thayil, Irene T, PA-C  Patients Current Diet:  Diet Order             DIET DYS 3 Room service appropriate? Yes; Fluid consistency: Thin  Diet effective now                   Precautions / Restrictions Precautions Precautions: Fall Restrictions Weight Bearing Restrictions Per Provider Order: No   Has the  patient had 2 or more falls or Blanchard fall with injury in the past year? No  Prior Activity Level Limited Community (1-2x/wk): Mod I with occasional use of cane  Prior Functional Level Self Care: Did the patient need help bathing, dressing, using the toilet or eating? Independent  Indoor Mobility: Did the patient need assistance with walking from room to room (with or without device)? Independent  Stairs: Did the patient need assistance with internal or external stairs (with or without device)? Independent  Functional Cognition: Did the patient need help planning regular tasks such as shopping or remembering to take medications? Independent  Patient Information Are you of Hispanic, Latino/Blanchard,or Spanish origin?: Blanchard. No, not of Hispanic, Latino/Blanchard, or Spanish origin What is your race?: Blanchard. White Do you need or want an interpreter to communicate with Blanchard doctor or health care staff?: 0. No  Patient's Response To:  Health Literacy and Transportation Is the patient able to respond to health literacy and transportation needs?: Yes Health Literacy - How often do you need to have someone help you when you read instructions, pamphlets, or other written material from your doctor or pharmacy?: Rarely In the past 12 months, has lack of transportation kept you from medical appointments or from getting medications?: Yes In the past 12 months, has lack of transportation kept you from meetings, work, or from getting things needed for daily living?: Yes  Journalist, newspaper / Equipment Home Equipment: Shower seat, Agricultural consultant (2 wheels), [place] - single point, BSC/3in1, Hospital bed  Prior Device Use: Indicate devices/aids used by the patient prior to current illness, exacerbation or injury?  Occasional use of cane  Current Functional Level Cognition  Orientation Level: Oriented X4    Extremity Assessment (includes Sensation/Coordination)  Upper Extremity Assessment: Defer to OT evaluation  Lower  Extremity Assessment: Generalized weakness    ADLs  Overall ADL's : Needs assistance/impaired Eating/Feeding: Independent Grooming: Set up, Sitting Upper Body Bathing: Minimal assistance, With adaptive equipment, Sitting Lower Body Bathing: Moderate assistance, With adaptive equipment, Sitting/lateral leans Upper Body Dressing : Minimal assistance, Sitting Lower Body Dressing: Maximal assistance, Sit to/from stand Toilet Transfer: Contact guard assist, +2 for safety/equipment, Stand-pivot, BSC/3in1, Rolling walker (2 wheels) Toileting- Clothing  Manipulation and Hygiene: Moderate assistance, Sitting/lateral lean General ADL Comments: Pt states due to edema LB ADLs have been more difficult, PLOF independent. Pt has good overall strength, able to stand at bedside CGA,    Mobility  Overal bed mobility: Needs Assistance Bed Mobility: Supine to Sit, Sit to Supine Supine to sit: Contact guard Sit to supine: Contact guard assist General bed mobility comments: CGA, increased time, use of bed rails and HOB elevated    Transfers  Overall transfer level: Needs assistance Equipment used: Rolling walker (2 wheels) Transfers: Sit to/from Stand Sit to Stand: Contact guard assist, Min assist General transfer comment: CGA for safety and MIN Blanchard for balance in standing and to adjust feet for improved balance due to wide BOS. Verbal cues for safe hand placement with standing to prevent pulling up from RW.    Ambulation / Gait / Stairs / Wheelchair Mobility  Ambulation/Gait Ambulation/Gait assistance: Clinical research associate (Feet): 10 Feet Assistive device: Rolling walker (2 wheels) Gait Pattern/deviations: Step-through pattern, Decreased stride length, Trunk flexed, Wide base of support General Gait Details: short partial step through gait pattern with flexed trunk, increased BOS and standing rest break to brush teeth between ambulation. Pt was limited due to pt requires Blanchard bariatric RW Gait  velocity: decreased Gait velocity interpretation: <1.31 ft/sec, indicative of household ambulator    Posture / Balance Balance Overall balance assessment: Needs assistance Sitting-balance support: No upper extremity supported, Feet supported Sitting balance-Leahy Scale: Fair Standing balance support: Single extremity supported, During functional activity, Bilateral upper extremity supported Standing balance-Leahy Scale: Fair Standing balance comment: able to stand at sink with forearm support to brush teeth with CGA    Special needs/care consideration Diabetic ulcer thigh anterior, distal, right scabbed wound on outer thigh noted 2/14 Enteric Precautions Bariatric Specility bed overlay mattress   Previous Home Environment  Living Arrangements:  (Mom)  Lives With: Family (Mom) Available Help at Discharge: Family, Available 24 hours/day (Mom) Type of Home: House Home Layout: One level, Other (Comment) Home Access: Stairs to enter Entrance Stairs-Rails: Can reach both Entrance Stairs-Number of Steps: 5 Bathroom Shower/Tub: Engineer, manufacturing systems: Handicapped height Bathroom Accessibility: Yes How Accessible: Accessible via walker Home Care Services: No Additional Comments: Pt sleeps in hospital bed which is currently broken, says she needs Blanchard new one. Lives with mother who is able to do light housework.  Discharge Living Setting Plans for Discharge Living Setting: Patient's home, Lives with (comment) (Mom) Type of Home at Discharge: House Discharge Home Layout: One level, Other (Comment) (has steps up and down to bathroom and living room) Discharge Home Access: Stairs to enter Entrance Stairs-Rails: Right, Left, Can reach both Entrance Stairs-Number of Steps: 5 Discharge Bathroom Shower/Tub: Tub/shower unit Discharge Bathroom Toilet: Handicapped height Discharge Bathroom Accessibility: Yes How Accessible: Accessible via walker Does the patient have any problems  obtaining your medications?: No  Social/Family/Support Systems Contact Information: Mom, Windell Moulding Anticipated Caregiver: Mom, supervision assistance only Anticipated Caregiver's Contact Information: see contacts Ability/Limitations of Caregiver: can provide supervision as well as asssit with getting legs in and out of bed Caregiver Availability: 24/7 Discharge Plan Discussed with Primary Caregiver: Yes Is Caregiver In Agreement with Plan?: Yes Does Caregiver/Family have Issues with Lodging/Transportation while Pt is in Rehab?: No  Goals Patient/Family Goal for Rehab: Mod I to supervision with PT and OT Expected length of stay: ELOS 5 to 7 days Additional Information: bariatric Pt/Family Agrees to Admission and willing to participate: Yes Program Orientation Provided &  Reviewed with Pt/Caregiver Including Roles  & Responsibilities: Yes  Decrease burden of Care through IP rehab admission: n/Blanchard  Possible need for SNF placement upon discharge: not anticipated  Patient Condition: I have reviewed medical records from [facility], spoken with CM, and patient. I met with patient at the bedside for inpatient rehabilitation assessment.  Patient will benefit from ongoing PT and OT, can actively participate in 3 hours of therapy Blanchard day 5 days of the week, and can make measurable gains during the admission.  Patient will also benefit from the coordinated team approach during an Inpatient Acute Rehabilitation admission.  The patient will receive intensive therapy as well as Rehabilitation physician, nursing, social worker, and care management interventions.  Due to bladder management, bowel management, safety, skin/wound care, disease management, medication administration, pain management, and patient education the patient requires 24 hour Blanchard day rehabilitation nursing.  The patient is currently *** with mobility and basic ADLs.  Discharge setting and therapy post discharge at home with home health is  anticipated.  Patient has agreed to participate in the Acute Inpatient Rehabilitation Program and will admit today.  Preadmission Screen Completed By:  Clois Dupes, RN MSN 04/29/2023 3:48 PM ______________________________________________________________________   Discussed status with Dr. Marland Kitchen on *** at *** and received approval for admission today.  Admission Coordinator:  Clois Dupes, RN MSN time Marland KitchenDorna Bloom ***   Assessment/Plan: Diagnosis: *** Does the need for close, 24 hr/day Medical supervision in concert with the patient's rehab needs make it unreasonable for this patient to be served in Blanchard less intensive setting? {yes_no_potentially:3041433} Co-Morbidities requiring supervision/potential complications: *** Due to {due WU:9811914}, does the patient require 24 hr/day rehab nursing? {yes_no_potentially:3041433} Does the patient require coordinated care of Blanchard physician, rehab nurse, PT, OT, and SLP to address physical and functional deficits in the context of the above medical diagnosis(es)? {yes_no_potentially:3041433} Addressing deficits in the following areas: {deficits:3041436} Can the patient actively participate in an intensive therapy program of at least 3 hrs of therapy 5 days Blanchard week? {yes_no_potentially:3041433} The potential for patient to make measurable gains while on inpatient rehab is {potential:3041437} Anticipated functional outcomes upon discharge from inpatient rehab: {functional outcomes:304600100} PT, {functional outcomes:304600100} OT, {functional outcomes:304600100} SLP Estimated rehab length of stay to reach the above functional goals is: *** Anticipated discharge destination: {anticipated dc setting:21604} 10. Overall Rehab/Functional Prognosis: {potential:3041437}   MD Signature: ***

## 2023-04-29 NOTE — Evaluation (Signed)
 Physical Therapy Evaluation Patient Details Name: Alyssa Blanchard MRN: [id number] DOB: 1970-10-19 Today's Date: 04/29/2023  History of Present Illness  53 year old, morbidly obese female presented to the ED and admitted for acute on chronic HFpEF, also noted to have a norovirus gastroenteritis as well as a lower extremity cellulitis, with history of chronic HFpEF, PAF on Eliquis, asthma, type 2 diabetes, hypertension, depression/anxiety  Clinical Impression  Pt is presenting below baseline level of functioning. Currently pt is CGA to Min A for bed mobility, sit to stand, short distance gait. Pt is limited currently due to lack of bariatric equipment and cardiopulmonary status. Pt is motivated and has support at home from her mother 24/7 for light physical assistance for bed mobility and cleaning, cooking, transportation. Due to pt current functional status, home set up and available assistance at home recommending skilled physical therapy services > 3 hours/day in order to address strength, balance and functional mobility to decrease risk for falls, injury, immobility, skin break down and re-hospitalization.           If plan is discharge home, recommend the following: A little help with walking and/or transfers;Assistance with cooking/housework;Assist for transportation;Help with stairs or ramp for entrance     Equipment Recommendations Hospital bed  Recommendations for Other Services  Rehab consult    Functional Status Assessment Patient has had a recent decline in their functional status and demonstrates the ability to make significant improvements in function in a reasonable and predictable amount of time.     Precautions / Restrictions Precautions Precautions: Fall Recall of Precautions/Restrictions: Intact Restrictions Weight Bearing Restrictions Per Provider Order: No      Mobility  Bed Mobility Overal bed mobility: Needs Assistance Bed Mobility: Supine to Sit, Sit to  Supine     Supine to sit: Contact guard Sit to supine: Contact guard assist   General bed mobility comments: CGA, increased time, use of bed rails and HOB elevated    Transfers Overall transfer level: Needs assistance Equipment used: Rolling walker (2 wheels) Transfers: Sit to/from Stand Sit to Stand: Contact guard assist, Min assist           General transfer comment: CGA for safety and MIN A for balance in standing and to adjust feet for improved balance due to wide BOS. Verbal cues for safe hand placement with standing to prevent pulling up from RW.    Ambulation/Gait Ambulation/Gait assistance: Contact guard assist Gait Distance (Feet): 10 Feet Assistive device: Rolling walker (2 wheels) Gait Pattern/deviations: Step-through pattern, Decreased stride length, Trunk flexed, Wide base of support Gait velocity: decreased Gait velocity interpretation: <1.31 ft/sec, indicative of household ambulator   General Gait Details: short partial step through gait pattern with flexed trunk, increased BOS and standing rest break to brush teeth between ambulation. Pt was limited due to pt requires a bariatric RW      Balance Overall balance assessment: Needs assistance Sitting-balance support: No upper extremity supported, Feet supported Sitting balance-Leahy Scale: Fair     Standing balance support: Single extremity supported, During functional activity, Bilateral upper extremity supported Standing balance-Leahy Scale: Fair Standing balance comment: able to stand at sink with forearm support to brush teeth with CGA         Pertinent Vitals/Pain Pain Assessment Pain Assessment: 0-10 Pain Score: 3  Pain Location: headache, R heel (5/10) Pain Descriptors / Indicators: Aching, Discomfort Pain Intervention(s): Monitored during session, Limited activity within patient's tolerance    Home Living Family/patient expects to be discharged to::  Private residence Living Arrangements:  Parent Available Help at Discharge: Family Type of Home: House Home Access: Stairs to enter Entrance Stairs-Rails: Can reach both Entrance Stairs-Number of Steps: 5   Home Layout: One level;Other (Comment) (has steps up/down to restroom and living room) Home Equipment: Pharmacist, hospital (2 wheels);Cane - single point;BSC/3in1;Hospital bed Additional Comments: Pt sleeps in hospital bed which is currently broken, says she needs a new one. Lives with mother who is able to do light housework.    Prior Function Prior Level of Function : Independent/Modified Independent             Mobility Comments: occasionally uses cane, mother assists RLE into bed sometimes ADLs Comments: Mother helps get clothes out of closet, independent with dressing/bathing.     Extremity/Trunk Assessment   Upper Extremity Assessment Upper Extremity Assessment: Defer to OT evaluation    Lower Extremity Assessment Lower Extremity Assessment: Generalized weakness    Cervical / Trunk Assessment Cervical / Trunk Assessment: Normal  Communication   Communication Communication: No apparent difficulties    Cognition Arousal: Alert Behavior During Therapy: WFL for tasks assessed/performed   PT - Cognitive impairments: No apparent impairments       Following commands: Intact       Cueing Cueing Techniques: Verbal cues     General Comments General comments (skin integrity, edema, etc.): O2 sats dropped to 84% on room air, up to 95% on 2L O2 via [place].        Assessment/Plan    PT Assessment Patient needs continued PT services  PT Problem List Decreased strength;Decreased balance;Decreased mobility;Decreased activity tolerance;Cardiopulmonary status limiting activity       PT Treatment Interventions DME instruction;Therapeutic activities;Gait training;Therapeutic exercise;Stair training;Balance training;Functional mobility training;Patient/family education    PT Goals (Current goals can  be found in the Care Plan section)  Acute Rehab PT Goals Patient Stated Goal: To get stronger and move better then return home. PT Goal Formulation: With patient Time For Goal Achievement: 05/13/23 Potential to Achieve Goals: Good    Frequency Min 1X/week        AM-PAC PT "6 Clicks" Mobility  Outcome Measure Help needed turning from your back to your side while in a flat bed without using bedrails?: A Little Help needed moving from lying on your back to sitting on the side of a flat bed without using bedrails?: A Little Help needed moving to and from a bed to a chair (including a wheelchair)?: A Little Help needed standing up from a chair using your arms (e.g., wheelchair or bedside chair)?: A Little Help needed to walk in hospital room?: A Little Help needed climbing 3-5 steps with a railing? : A Lot 6 Click Score: 17    End of Session Equipment Utilized During Treatment: Gait belt Activity Tolerance: Patient tolerated treatment well Patient left: in bed;with call bell/phone within reach Nurse Communication: Mobility status PT Visit Diagnosis: Unsteadiness on feet (R26.81);Other abnormalities of gait and mobility (R26.89)    Time: 4098-1191 PT Time Calculation (min) (ACUTE ONLY): 40 min   Charges:   PT Evaluation $PT Eval Low Complexity: 1 Low PT Treatments $Therapeutic Activity: 23-37 mins PT General Charges $$ ACUTE PT VISIT: 1 Visit       Harrel Carina, DPT, CLT  Acute Rehabilitation Services Office: [phone] (Secure chat preferred)   Claudia Desanctis 04/29/2023, 10:28 AM

## 2023-04-29 NOTE — Assessment & Plan Note (Signed)
 Urine culture positive for E coli,  Infection present on admission.   Patient has been on IV ceftriaxone since 02/15 Blood cultures have been no growth  Wbc is 7,3 and she has been afebrile.   Plan to discontinue antibiotic therapy.  Continue close monitoring.

## 2023-04-29 NOTE — Assessment & Plan Note (Addendum)
 Hyponatremia,   Renal function with serum cr at 1,1 with K at 3,4 and serum bicarbonate at 32 Na 134   Plan to continue K correction with oral Kcl 40 meq x 2 doses and follow up renal function in am.

## 2023-04-29 NOTE — Assessment & Plan Note (Signed)
Resolved, completed antibiotic therapy

## 2023-04-29 NOTE — Progress Notes (Addendum)
  Progress Note   Patient: Alyssa Blanchard ZOX:[id number] DOB: [birth year] DOA: 04/23/2023     6 DOS: the patient was seen and examined on 04/29/2023   Brief hospital course: Alyssa Blanchard is a 53 y.o. female with medical history significant for chronic HFpEF, PAF on Eliquis, asthma, T2DM, HTN, depression/anxiety, morbid obesity who is admitted with acute on chronic HFpEF.  Assessment and Plan: * Acute on chronic diastolic CHF (congestive heart failure) (HCC) Echocardiogram with preserved LV systolic function with EF 70 to 75%, mild LVH, RV systolic function preserved, RVSP 38,5 mmHg, normal size atriums, no significant valvular disease.   Urine output is 5,501 ml Systolic blood pressure 130 mmHg range.   Plan to continue metoprolol and spironolactone.  Transitioned to bumetanide 2 mg bid.  Out of bed to chair tid with meals, follow up with Pt and Ot.  Possible transfer to CIR.   Paroxysmal atrial fibrillation (HCC) SP cardioversion, she continue sinus rhythm on telemetry monitoring.  Continue metoprolol and anticoagulation with apixaban.   UTI (urinary tract infection) Urine culture positive for E coli,  Infection present on admission.   Patient has been on IV ceftriaxone since 02/15 Blood cultures have been no growth  Wbc is 7,3 and she has been afebrile.   Plan to discontinue antibiotic therapy.  Continue close monitoring.   Hypokalemia Hyponatremia,   Renal function with serum cr at 1,1 with K at 3,4 and serum bicarbonate at 32 Na 134   Plan to continue K correction with oral Kcl 40 meq x 2 doses and follow up renal function in am.  Hypertension Continue blood pressure control with metoprolol.   Gastroenteritis due to norovirus Diarrhea has been improving.  Patient has been on supportive medical therapy.   Cellulitis of right leg Resolved, will discontinue antibiotic therapy.   Asthma No signs of acute exacerbation.  Continue bronchodilator therapy.    Type 2 diabetes mellitus without complication, without long-term current use of insulin (HCC) Continue insulin sliding scale for glucose cover and monitoring.   Obesity, class 3 Calculated BMI is 62,7   Iron deficiency anemia Continue with oral iron supplementation Hgb today is 8,9         Subjective: Patient is feeling better, but continue very weak and deconditioned, not back to her baseline, diarrhea is improving.   Physical Exam: Vitals:   04/29/23 0630 04/29/23 0718 04/29/23 0838 04/29/23 1121  BP:  134/60 134/60 130/65  Pulse:  82 90   Resp:  16    Temp:  99 F (37.2 C)  97.9 F (36.6 C)  TempSrc:  Oral  Axillary  SpO2:  96%    Weight: (!) 171.1 kg     Height:       Neurology awake and alert ENT with mild pallor Cardiovascular with S1 and S2 present and regular with no gallops, rubs or murmurs No JVD No lower extremity edema  Respiratory with no wheezing, rales or rhonchi, on anterior auscultation  Abdomen with no distention  Data Reviewed:    Family Communication: no family at the bedside   Disposition: Status is: Inpatient Remains inpatient appropriate because: pending transfer to CIR   Planned Discharge Destination: Rehab     Author: Coralie Keens, MD 04/29/2023 12:37 PM  For on call review [email or web address].

## 2023-04-30 ENCOUNTER — Encounter (HOSPITAL_COMMUNITY): Payer: Self-pay | Admitting: Internal Medicine

## 2023-04-30 ENCOUNTER — Inpatient Hospital Stay (HOSPITAL_COMMUNITY)
Admission: AD | Admit: 2023-04-30 | Discharge: 2023-05-12 | DRG: 945 | Disposition: A | Discharge status: Home Health | Payer: Medicaid Other | Source: Intra-hospital | Attending: Physical Medicine and Rehabilitation | Admitting: Physical Medicine and Rehabilitation

## 2023-04-30 ENCOUNTER — Other Ambulatory Visit (HOSPITAL_COMMUNITY): Payer: Self-pay

## 2023-04-30 DIAGNOSIS — Z808 Family history of malignant neoplasm of other organs or systems: Secondary | ICD-10-CM

## 2023-04-30 DIAGNOSIS — Z841 Family history of disorders of kidney and ureter: Secondary | ICD-10-CM

## 2023-04-30 DIAGNOSIS — I110 Hypertensive heart disease with heart failure: Secondary | ICD-10-CM | POA: Diagnosis present

## 2023-04-30 DIAGNOSIS — K625 Hemorrhage of anus and rectum: Secondary | ICD-10-CM | POA: Diagnosis not present

## 2023-04-30 DIAGNOSIS — N3000 Acute cystitis without hematuria: Secondary | ICD-10-CM | POA: Diagnosis not present

## 2023-04-30 DIAGNOSIS — L309 Dermatitis, unspecified: Secondary | ICD-10-CM | POA: Diagnosis present

## 2023-04-30 DIAGNOSIS — K602 Anal fissure, unspecified: Secondary | ICD-10-CM | POA: Diagnosis not present

## 2023-04-30 DIAGNOSIS — E1165 Type 2 diabetes mellitus with hyperglycemia: Secondary | ICD-10-CM

## 2023-04-30 DIAGNOSIS — E876 Hypokalemia: Secondary | ICD-10-CM | POA: Diagnosis present

## 2023-04-30 DIAGNOSIS — Z87440 Personal history of urinary (tract) infections: Secondary | ICD-10-CM

## 2023-04-30 DIAGNOSIS — Z87891 Personal history of nicotine dependence: Secondary | ICD-10-CM

## 2023-04-30 DIAGNOSIS — L03115 Cellulitis of right lower limb: Secondary | ICD-10-CM | POA: Diagnosis present

## 2023-04-30 DIAGNOSIS — I5032 Chronic diastolic (congestive) heart failure: Secondary | ICD-10-CM | POA: Diagnosis present

## 2023-04-30 DIAGNOSIS — K219 Gastro-esophageal reflux disease without esophagitis: Secondary | ICD-10-CM | POA: Diagnosis present

## 2023-04-30 DIAGNOSIS — Z6841 Body Mass Index (BMI) 40.0 and over, adult: Secondary | ICD-10-CM

## 2023-04-30 DIAGNOSIS — Z91040 Latex allergy status: Secondary | ICD-10-CM

## 2023-04-30 DIAGNOSIS — Z7901 Long term (current) use of anticoagulants: Secondary | ICD-10-CM

## 2023-04-30 DIAGNOSIS — B379 Candidiasis, unspecified: Secondary | ICD-10-CM | POA: Diagnosis not present

## 2023-04-30 DIAGNOSIS — Z7985 Long-term (current) use of injectable non-insulin antidiabetic drugs: Secondary | ICD-10-CM

## 2023-04-30 DIAGNOSIS — D508 Other iron deficiency anemias: Secondary | ICD-10-CM

## 2023-04-30 DIAGNOSIS — G4733 Obstructive sleep apnea (adult) (pediatric): Secondary | ICD-10-CM | POA: Diagnosis present

## 2023-04-30 DIAGNOSIS — R5381 Other malaise: Principal | ICD-10-CM

## 2023-04-30 DIAGNOSIS — Z860100 Personal history of colon polyps, unspecified: Secondary | ICD-10-CM

## 2023-04-30 DIAGNOSIS — K5900 Constipation, unspecified: Secondary | ICD-10-CM | POA: Diagnosis present

## 2023-04-30 DIAGNOSIS — J45909 Unspecified asthma, uncomplicated: Secondary | ICD-10-CM | POA: Diagnosis present

## 2023-04-30 DIAGNOSIS — I509 Heart failure, unspecified: Secondary | ICD-10-CM | POA: Diagnosis not present

## 2023-04-30 DIAGNOSIS — Z9884 Bariatric surgery status: Secondary | ICD-10-CM

## 2023-04-30 DIAGNOSIS — Z7951 Long term (current) use of inhaled steroids: Secondary | ICD-10-CM

## 2023-04-30 DIAGNOSIS — I5033 Acute on chronic diastolic (congestive) heart failure: Secondary | ICD-10-CM | POA: Diagnosis not present

## 2023-04-30 DIAGNOSIS — Z800 Family history of malignant neoplasm of digestive organs: Secondary | ICD-10-CM

## 2023-04-30 DIAGNOSIS — E66813 Obesity, class 3: Secondary | ICD-10-CM | POA: Diagnosis present

## 2023-04-30 DIAGNOSIS — Z833 Family history of diabetes mellitus: Secondary | ICD-10-CM

## 2023-04-30 DIAGNOSIS — R251 Tremor, unspecified: Secondary | ICD-10-CM | POA: Diagnosis present

## 2023-04-30 DIAGNOSIS — F419 Anxiety disorder, unspecified: Secondary | ICD-10-CM | POA: Diagnosis present

## 2023-04-30 DIAGNOSIS — Z8349 Family history of other endocrine, nutritional and metabolic diseases: Secondary | ICD-10-CM

## 2023-04-30 DIAGNOSIS — Z560 Unemployment, unspecified: Secondary | ICD-10-CM

## 2023-04-30 DIAGNOSIS — Z9109 Other allergy status, other than to drugs and biological substances: Secondary | ICD-10-CM

## 2023-04-30 DIAGNOSIS — I480 Paroxysmal atrial fibrillation: Secondary | ICD-10-CM | POA: Diagnosis present

## 2023-04-30 DIAGNOSIS — Z8249 Family history of ischemic heart disease and other diseases of the circulatory system: Secondary | ICD-10-CM

## 2023-04-30 DIAGNOSIS — E119 Type 2 diabetes mellitus without complications: Secondary | ICD-10-CM | POA: Diagnosis present

## 2023-04-30 DIAGNOSIS — I5023 Acute on chronic systolic (congestive) heart failure: Secondary | ICD-10-CM

## 2023-04-30 DIAGNOSIS — F32A Depression, unspecified: Secondary | ICD-10-CM | POA: Diagnosis present

## 2023-04-30 DIAGNOSIS — K6289 Other specified diseases of anus and rectum: Secondary | ICD-10-CM | POA: Diagnosis not present

## 2023-04-30 DIAGNOSIS — Z820 Family history of epilepsy and other diseases of the nervous system: Secondary | ICD-10-CM

## 2023-04-30 DIAGNOSIS — Z888 Allergy status to other drugs, medicaments and biological substances status: Secondary | ICD-10-CM

## 2023-04-30 DIAGNOSIS — Z79899 Other long term (current) drug therapy: Secondary | ICD-10-CM

## 2023-04-30 LAB — BASIC METABOLIC PANEL
Anion gap: 12 (ref 5–15)
BUN: 9 mg/dL (ref 6–20)
CO2: 32 mmol/L (ref 22–32)
Calcium: 8.4 mg/dL — ABNORMAL LOW (ref 8.9–10.3)
Chloride: 92 mmol/L — ABNORMAL LOW (ref 98–111)
Creatinine, Ser: 0.75 mg/dL (ref 0.44–1.00)
GFR, Estimated: >60 mL/min (ref >60)
Glucose, Bld: 142 mg/dL — ABNORMAL HIGH (ref 70–99)
Potassium: 3.8 mmol/L (ref 3.5–5.1)
Sodium: 136 mmol/L (ref 135–145)

## 2023-04-30 LAB — CULTURE, BLOOD (ROUTINE X 2)
Culture: NO GROWTH
Culture: NO GROWTH

## 2023-04-30 LAB — GLUCOSE, CAPILLARY
Glucose-Capillary: 109 mg/dL — ABNORMAL HIGH (ref 70–99)
Glucose-Capillary: 149 mg/dL — ABNORMAL HIGH (ref 70–99)
Glucose-Capillary: 229 mg/dL — ABNORMAL HIGH (ref 70–99)

## 2023-04-30 LAB — MAGNESIUM: Magnesium: 1.7 mg/dL (ref 1.7–2.4)

## 2023-04-30 MED ORDER — MOMETASONE FURO-FORMOTEROL FUM 200-5 MCG/ACT IN AERO
1.0000 | INHALATION_SPRAY | Freq: Every day | RESPIRATORY_TRACT | Status: DC
  Administered 2023-05-02 – 2023-05-12 (×11): 1 via RESPIRATORY_TRACT
  Filled 2023-04-30 (×2): qty 8.8

## 2023-04-30 MED ORDER — APIXABAN 5 MG PO TABS
5.0000 mg | ORAL_TABLET | Freq: Two times a day (BID) | ORAL | Status: DC
  Administered 2023-04-30 – 2023-05-12 (×24): 5 mg via ORAL
  Filled 2023-04-30 (×24): qty 1

## 2023-04-30 MED ORDER — PREGABALIN 50 MG PO CAPS
200.0000 mg | ORAL_CAPSULE | Freq: Two times a day (BID) | ORAL | Status: DC
  Administered 2023-04-30 – 2023-05-12 (×24): 200 mg via ORAL
  Filled 2023-04-30 (×24): qty 4

## 2023-04-30 MED ORDER — METOPROLOL SUCCINATE ER 50 MG PO TB24
75.0000 mg | ORAL_TABLET | Freq: Every day | ORAL | Status: DC
  Administered 2023-04-30: 75 mg via ORAL
  Filled 2023-04-30: qty 1

## 2023-04-30 MED ORDER — METOPROLOL SUCCINATE ER 25 MG PO TB24
75.0000 mg | ORAL_TABLET | Freq: Every day | ORAL | Status: DC
  Administered 2023-05-01 – 2023-05-12 (×12): 75 mg via ORAL
  Filled 2023-04-30 (×12): qty 1

## 2023-04-30 MED ORDER — HYDROCODONE-ACETAMINOPHEN 10-325 MG PO TABS
1.0000 | ORAL_TABLET | Freq: Four times a day (QID) | ORAL | Status: DC | PRN
Start: 2023-04-30 — End: 2023-05-12
  Administered 2023-04-30: 1 via ORAL
  Administered 2023-05-01: 2 via ORAL
  Administered 2023-05-01 (×2): 1 via ORAL
  Administered 2023-05-02 – 2023-05-04 (×7): 2 via ORAL
  Administered 2023-05-04: 1 via ORAL
  Administered 2023-05-04 – 2023-05-06 (×7): 2 via ORAL
  Administered 2023-05-07: 1 via ORAL
  Administered 2023-05-07 – 2023-05-12 (×16): 2 via ORAL
  Filled 2023-04-30 (×12): qty 2
  Filled 2023-04-30 (×2): qty 1
  Filled 2023-04-30: qty 2
  Filled 2023-04-30: qty 1
  Filled 2023-04-30 (×12): qty 2
  Filled 2023-04-30: qty 1
  Filled 2023-04-30 (×7): qty 2

## 2023-04-30 MED ORDER — BUMETANIDE 1 MG PO TABS
1.0000 mg | ORAL_TABLET | Freq: Two times a day (BID) | ORAL | 0 refills | Status: DC
  Filled 2023-04-30: qty 60, 30d supply, fill #0

## 2023-04-30 MED ORDER — SENNOSIDES-DOCUSATE SODIUM 8.6-50 MG PO TABS
1.0000 | ORAL_TABLET | Freq: Every evening | ORAL | Status: DC | PRN
  Administered 2023-05-05 – 2023-05-08 (×4): 1 via ORAL
  Filled 2023-04-30 (×5): qty 1

## 2023-04-30 MED ORDER — METOPROLOL SUCCINATE ER 25 MG PO TB24
75.0000 mg | ORAL_TABLET | Freq: Every day | ORAL | 0 refills | Status: DC
  Filled 2023-04-30 (×3): qty 90, 30d supply, fill #0

## 2023-04-30 MED ORDER — CETIRIZINE HCL 10 MG PO TABS
10.0000 mg | ORAL_TABLET | Freq: Every day | ORAL | Status: DC
  Administered 2023-05-01 – 2023-05-12 (×12): 10 mg via ORAL
  Filled 2023-04-30 (×13): qty 1

## 2023-04-30 MED ORDER — BUMETANIDE 1 MG PO TABS
1.0000 mg | ORAL_TABLET | Freq: Two times a day (BID) | ORAL | Status: DC
  Administered 2023-04-30 (×2): 1 mg via ORAL
  Filled 2023-04-30 (×3): qty 1

## 2023-04-30 MED ORDER — POTASSIUM CHLORIDE CRYS ER 20 MEQ PO TBCR
40.0000 meq | EXTENDED_RELEASE_TABLET | Freq: Once | ORAL | Status: AC
  Administered 2023-04-30: 40 meq via ORAL
  Filled 2023-04-30: qty 2

## 2023-04-30 MED ORDER — AQUAPHOR EX OINT
TOPICAL_OINTMENT | Freq: Two times a day (BID) | CUTANEOUS | Status: DC
  Filled 2023-04-30: qty 50

## 2023-04-30 MED ORDER — INSULIN ASPART 100 UNIT/ML IJ SOLN
0.0000 [IU] | Freq: Three times a day (TID) | INTRAMUSCULAR | Status: DC
Start: 2023-04-30 — End: 2023-05-12
  Administered 2023-04-30: 3 [IU] via SUBCUTANEOUS
  Administered 2023-05-01 – 2023-05-02 (×5): 2 [IU] via SUBCUTANEOUS
  Administered 2023-05-02: 1 [IU] via SUBCUTANEOUS
  Administered 2023-05-03 (×3): 2 [IU] via SUBCUTANEOUS
  Administered 2023-05-04: 1 [IU] via SUBCUTANEOUS
  Administered 2023-05-04: 2 [IU] via SUBCUTANEOUS
  Administered 2023-05-04: 1 [IU] via SUBCUTANEOUS
  Administered 2023-05-05: 2 [IU] via SUBCUTANEOUS
  Administered 2023-05-05 – 2023-05-06 (×3): 1 [IU] via SUBCUTANEOUS
  Administered 2023-05-06: 2 [IU] via SUBCUTANEOUS
  Administered 2023-05-06 – 2023-05-07 (×2): 1 [IU] via SUBCUTANEOUS
  Administered 2023-05-07: 2 [IU] via SUBCUTANEOUS
  Administered 2023-05-07 – 2023-05-08 (×3): 1 [IU] via SUBCUTANEOUS
  Administered 2023-05-08 – 2023-05-09 (×2): 2 [IU] via SUBCUTANEOUS
  Administered 2023-05-09: 3 [IU] via SUBCUTANEOUS
  Administered 2023-05-10: 2 [IU] via SUBCUTANEOUS
  Administered 2023-05-10 – 2023-05-11 (×2): 1 [IU] via SUBCUTANEOUS
  Administered 2023-05-11: 2 [IU] via SUBCUTANEOUS

## 2023-04-30 MED ORDER — SPIRONOLACTONE 25 MG PO TABS
25.0000 mg | ORAL_TABLET | Freq: Every day | ORAL | Status: DC
  Administered 2023-05-01 – 2023-05-12 (×12): 25 mg via ORAL
  Filled 2023-04-30 (×12): qty 1

## 2023-04-30 MED ORDER — ALBUTEROL SULFATE (2.5 MG/3ML) 0.083% IN NEBU
3.0000 mL | INHALATION_SOLUTION | RESPIRATORY_TRACT | Status: DC | PRN
Start: 2023-04-30 — End: 2023-05-12
  Administered 2023-05-05 – 2023-05-08 (×4): 3 mL via RESPIRATORY_TRACT
  Filled 2023-04-30 (×4): qty 3

## 2023-04-30 MED ORDER — PANTOPRAZOLE SODIUM 40 MG PO TBEC
40.0000 mg | DELAYED_RELEASE_TABLET | Freq: Two times a day (BID) | ORAL | Status: DC
Start: 2023-04-30 — End: 2023-05-12
  Administered 2023-04-30 – 2023-05-12 (×24): 40 mg via ORAL
  Filled 2023-04-30 (×24): qty 1

## 2023-04-30 MED ORDER — POTASSIUM CHLORIDE CRYS ER 20 MEQ PO TBCR
20.0000 meq | EXTENDED_RELEASE_TABLET | Freq: Every day | ORAL | 0 refills | Status: DC
  Filled 2023-04-30: qty 30, 30d supply, fill #0

## 2023-04-30 MED ORDER — ACETAMINOPHEN 650 MG RE SUPP: 650.0000 mg | Freq: Four times a day (QID) | RECTAL | Status: DC | PRN

## 2023-04-30 MED ORDER — CLOTRIMAZOLE 1 % VA CREA
1.0000 | TOPICAL_CREAM | Freq: Every day | VAGINAL | Status: DC
  Filled 2023-04-30: qty 45

## 2023-04-30 MED ORDER — MAGNESIUM SULFATE 2 GM/50ML IV SOLN
2.0000 g | Freq: Once | INTRAVENOUS | Status: AC
  Administered 2023-04-30: 2 g via INTRAVENOUS
  Filled 2023-04-30: qty 50

## 2023-04-30 MED ORDER — SILVER SULFADIAZINE 1 % EX CREA
TOPICAL_CREAM | Freq: Every day | CUTANEOUS | Status: DC
  Filled 2023-04-30: qty 85

## 2023-04-30 MED ORDER — BUMETANIDE 1 MG PO TABS
1.0000 mg | ORAL_TABLET | Freq: Two times a day (BID) | ORAL | Status: DC
  Administered 2023-05-01 – 2023-05-02 (×3): 1 mg via ORAL
  Filled 2023-04-30 (×3): qty 1

## 2023-04-30 MED ORDER — POLYSACCHARIDE IRON COMPLEX 150 MG PO CAPS
150.0000 mg | ORAL_CAPSULE | Freq: Every day | ORAL | Status: DC
  Administered 2023-05-01 – 2023-05-03 (×3): 150 mg via ORAL
  Filled 2023-04-30 (×9): qty 1

## 2023-04-30 MED ORDER — CLOTRIMAZOLE 1 % VA CREA
1.0000 | TOPICAL_CREAM | Freq: Every day | VAGINAL | 0 refills | Status: DC
  Filled 2023-04-30: qty 45, 7d supply, fill #0

## 2023-04-30 MED ORDER — MELATONIN 5 MG PO TABS
10.0000 mg | ORAL_TABLET | Freq: Every day | ORAL | Status: DC
Start: 2023-04-30 — End: 2023-05-12
  Administered 2023-05-01 – 2023-05-11 (×11): 10 mg via ORAL
  Filled 2023-04-30 (×12): qty 2

## 2023-04-30 MED ORDER — DICLOFENAC SODIUM 1 % EX GEL
2.0000 g | Freq: Two times a day (BID) | CUTANEOUS | Status: DC
  Administered 2023-04-30 – 2023-05-12 (×24): 2 g via TOPICAL
  Filled 2023-04-30 (×2): qty 100

## 2023-04-30 MED ORDER — MAGNESIUM OXIDE -MG SUPPLEMENT 400 (240 MG) MG PO TABS
400.0000 mg | ORAL_TABLET | Freq: Every day | ORAL | Status: DC
  Administered 2023-05-01 – 2023-05-12 (×12): 400 mg via ORAL
  Filled 2023-04-30 (×12): qty 1

## 2023-04-30 MED ORDER — ACETAMINOPHEN 325 MG PO TABS
650.0000 mg | ORAL_TABLET | Freq: Four times a day (QID) | ORAL | Status: DC | PRN
  Administered 2023-05-03 – 2023-05-06 (×5): 650 mg via ORAL
  Filled 2023-04-30 (×6): qty 2

## 2023-04-30 MED ORDER — BISACODYL 5 MG PO TBEC
5.0000 mg | DELAYED_RELEASE_TABLET | Freq: Every day | ORAL | Status: DC | PRN
  Administered 2023-05-07 – 2023-05-10 (×3): 5 mg via ORAL
  Filled 2023-04-30 (×3): qty 1

## 2023-04-30 NOTE — Progress Notes (Signed)
 Heart Failure Nurse Navigator Progress Note  PCP: Randel Pigg, Dorma Russell, MD PCP-Cardiologist: [facility]  Admission Diagnosis: Hypervolemia, Shortness of breath,  Admitted from: Home via EMS  Presentation:   Alyssa Blanchard presented with exertional shortness of breath, and fluid overload,2 + pitting BLE edema,  been hard to ambulate, recent medication changes before this event happened.A-fib with RVR, started on amiodarone and diltiazem.  Recent discharge from hospital .( [facility] ,  BP 98/43, HR 99, BNP 169, BMI 73.70. CXR with mild enlargement of the cardiopericardial silhouette with indistinct pulmonary vasculature. TEE/DCCV on 2/18 with successful conversion to normal sinus rhythm. Patient asking about changing from [facility] to a [facility]. She reported that she just has had so many issues, and she lives in [place], that she can't keep going all the way to [facility] anymore.   Patient was educated on the sign and symptoms of heart failure, daily weights, when to call her doctor or go to the ED. Diet/ fluid restrictions, taking all medications as prescribed, and attending all medical appointments. Patient verbalized her understanding of education. A HF TOC appointment was scheduled on 05/26/2023 @ 10:15 am after patients discharge from CIR at the request of Dr. Ella Jubilee.   ECHO/ LVEF: 70-75 %   Clinical Course:  Past Medical History:  Diagnosis Date   Anxiety    Asthma    Atrial fibrillation (HCC)    Depression    DM (diabetes mellitus), type 2 (HCC) 02/16/2022   Dysrhythmia    new onset Afib rvr   GERD (gastroesophageal reflux disease)    Heart failure (HCC)    Heel spur    bilat   History of bronchitis    History of chicken pox    History of urinary tract infection    Hypertension    Migraines    Plantar fasciitis    bilat   STD (sexually transmitted disease)    chl hx & hsv 1&2   Tremors of nervous system    Urinary incontinence       Social History   Socioeconomic History   Marital status: Single    Spouse name: Not on file   Number of children: 0   Years of education: 12   Highest education level: Some college, no degree  Occupational History   Occupation: Chartered certified accountant: [employer]   Occupation: Unemployed  Tobacco Use   Smoking status: Former    Current packs/day: 0.00    Average packs/day: 0.3 packs/day for 5.0 years (1.3 ttl pk-yrs)    Types: Cigarettes    Start date: 03/11/2007    Quit date: 03/10/2012    Years since quitting: 11.1   Smokeless tobacco: Never  Vaping Use   Vaping status: Never Used  Substance and Sexual Activity   Alcohol use: Not Currently    Comment: Reports history of binge drinking; last in 2021   Drug use: Not Currently    Types: Marijuana, Cocaine   Sexual activity: Yes    Partners: Male    Birth control/protection: Condom  Other Topics Concern   Not on file  Social History Narrative   Regular exercise-no   Caffeine Use-yes, 1-2 cups of caffeine daily   Social Drivers of Health   Financial Resource Strain: Patient Declined (11/24/2022)   Overall Financial Resource Strain (CARDIA)    Difficulty of Paying Living Expenses: Patient declined  Food Insecurity: No Food Insecurity (04/24/2023)   Hunger Vital Sign  Worried About Programme researcher, broadcasting/film/video in the Last Year: Never true    Ran Out of Food in the Last Year: Never true  Transportation Needs: Unmet Transportation Needs (04/24/2023)   PRAPARE - Administrator, Civil Service (Medical): Yes    Lack of Transportation (Non-Medical): No  Physical Activity: Inactive (01/29/2023)   Received from [facility]   Exercise Vital Sign    Days of Exercise per Week: 0 days    Minutes of Exercise per Session: 0 min  Stress: Stress Concern Present (11/24/2022)   [employer] of Occupational Health - Occupational Stress Questionnaire    Feeling of Stress : Rather much  Social Connections: Socially  Isolated (11/24/2022)   Social Connection and Isolation Panel [NHANES]    Frequency of Communication with Friends and Family: Three times a week    Frequency of Social Gatherings with Friends and Family: Three times a week    Attends Religious Services: Never    Active Member of Clubs or Organizations: No    Attends Engineer, structural: Not on file    Marital Status: Never married   Education Assessment and Provision:  Detailed education and instructions provided on heart failure disease management including the following:  Signs and symptoms of Heart Failure When to call the physician Importance of daily weights Low sodium diet Fluid restriction Medication management Anticipated future follow-up appointments  Patient education given on each of the above topics.  Patient acknowledges understanding via teach back method and acceptance of all instructions.  Education Materials:  "Living Better With Heart Failure" Booklet, HF zone tool, & Daily Weight Tracker Tool.  Patient has scale at home: Yes Patient has pill box at home: Yes    High Risk Criteria for Readmission and/or Poor Patient Outcomes: Heart failure hospital admissions (last 6 months): 1  No Show rate: 15 %  Difficult social situation: lives with her mother,  Demonstrates medication adherence: Yes Primary Language: English Literacy level: Reading, writing, and comprehension.   Barriers of Care:   Diet/ fluid restrictions Daily weights Mobility, going to CIR for Rehab.   Considerations/Referrals:   Referral made to Heart Failure Pharmacist Stewardship: No  Referral made to Heart Failure CSW/NCM TOC: No Referral made to Heart & Vascular TOC clinic: Yes, Per Dr. Ella Jubilee Levander Campion. NP on 05/26/2023 @ 10:15 am.   Items for Follow-up on DC/TOC: Continued HF education Diet/ fluid restrictions/ daily weights   Rhae Hammock, BSN, RN Heart Failure Print production planner Chat Only

## 2023-04-30 NOTE — Progress Notes (Signed)
 Report give to CIR RN and patient belongings gathered, patient was taken to CIR.

## 2023-04-30 NOTE — Discharge Summary (Addendum)
 Physician Discharge Summary   Patient: Alyssa Blanchard MRN: [id number] DOB: 04/28/1970  Admit date:     04/23/2023  Discharge date: 04/30/23  Discharge Physician: York Ram Braley Luckenbaugh   PCP: Randel Pigg, Dorma Russell, MD   Recommendations at discharge:    Patient has been placed on metoprolol succinate 75 mg for rate control atrial fibrillation.  Possible addition of ARB as outpatient depending on blood pressure.  Continue diuresis with bumetanide 1 mg bid Fluid restriction 1200 to 1500 ml per day.  GLP 1 agonist not covered by her health insurance.  Follow up renal function and electrolytes in 7 days.  Follow up with Dr Edward Jolly in 7 to 10 days Follow up with Cardiology as scheduled.   Discharge Diagnoses: Principal Problem:   Acute on chronic diastolic CHF (congestive heart failure) (HCC) Active Problems:   Paroxysmal atrial fibrillation (HCC)   UTI (urinary tract infection)   Hypokalemia   Hypertension   Gastroenteritis due to norovirus   Cellulitis of right leg   Asthma   Type 2 diabetes mellitus without complication, without long-term current use of insulin (HCC)   Obesity, class 3   Iron deficiency anemia  Resolved Problems:   * No resolved hospital problems. [facility] Course: Alyssa Blanchard was admitted to the hospital with the working diagnosis of heart failure exacerbation complicated with atrial fibrillation with rapid ventricular response and norovirus gastroenteritis.    53 y.o. female with medical history significant for heart failure, paroxysmal atrial fibrillation, asthma, T2DM, HTN, depression/anxiety, and obesity class 3 who p[resented with dyspnea and edema.  Recent hospitalization at [facility] 01/01 to 03/17/23 for heart failure exacerbation, she was diuresed 17 L with good toleration.  One week after her discharge she noted worsening dyspnea and lower extremity edema, despite taking her medications as prescribed. Because of worsening symptoms she came to the  hospital. On her initial physical examination her blood pressure was 116/41, HR 89, RR 20 and 02 saturation 98%, lungs with no wheezing or rhonchi, heart with S1 and S2 present and regular with no gallops, or rubs, abdomen protuberant but non tender, positive lower extremity edema.   Na 137, K 3,1 Cl 98 bicarbonate 28 glucose 128 bun 6 cr 0,84  High sensitive troponin 7 and 7  Wbc 10,9 hgb 9,3 plt 215 Sars covid 19 negative  Influenza negative   Chest radiograph with hypoinflation, right rotation, bilateral hilar vascular congestion with fluid in the right fissure. No infiltrates and no effusions.   EKG 89 bpm, normal axis, normal intervals, qtc 490, sinus rhythm with no significant ST segment or T wave changes.   Patient was placed on IV furosemide for diuresis. She developed atrial fibrillation with RVR, cardiology was consulted and she underwent on 02/18 direct current cardioversion with conversion to sinus rhythm.  During her hospitalization she was treated for urinary tract infection, right leg cellulitis and norovirus gastroenteritis.   Patient had improvement in her volume status, she was evaluated by PT/ OT and recommendations to continue therapy in inpatient rehab.   Assessment and Plan: * Acute on chronic diastolic CHF (congestive heart failure) (HCC) Echocardiogram with preserved LV systolic function with EF 70 to 75%, mild LVH, RV systolic function preserved, RVSP 38,5 mmHg, normal size atriums, no significant valvular disease.   Patient was placed on IV furosemide for diuresis, negative fluid balance was achieved, 17,928 ml, with significant improvement in her symptoms.  Plan to continue metoprolol and spironolactone.  Transitioned to bumetanide 1 mg  bid.  Continue with fluid restriction 1200 ml to 1500 ml per day.  Out of bed to chair tid with meals, follow up with Pt and Ot.  Transfer to CIR.   Paroxysmal atrial fibrillation (HCC) SP cardioversion, she has been on sinus  rhythm.  Continue metoprolol and anticoagulation with apixaban.   UTI (urinary tract infection) Urine culture positive for E coli,  Infection present on admission.   Blood cultures have been no growth  Wbc at 7,3 and she has been afebrile.  She completed antibiotic therapy with IV ceftriaxone during her hospitalization.   Candida vaginosis, placed on topical miconazole for 7 days.   Hypokalemia Hyponatremia,   At the time of her discharge her renal function is stable with serum cr at 0,75, with K at 3,8 and serum bicarbonate at 32 Na 126 and Mg 1,7   She will have 2 g Mag sulfate IV and 40 meq kcl po prior to her transfer to CIR.  Plan to continue diuresis with loop diuretic and mineralocorticoid receptor blocker.  Will ad 20 meq Kcl daily to avoid further hypokalemia.   Follow up renal function and electrolytes in 7 days.   Hypertension Continue blood pressure control with metoprolol.   Gastroenteritis due to norovirus Diarrhea has been improving.  Patient has been on supportive medical therapy.   Cellulitis of right leg Resolved, completed antibiotic therapy.   Asthma No signs of acute exacerbation.  Continue bronchodilator therapy.   Type 2 diabetes mellitus without complication, without long-term current use of insulin (HCC) Patient was placed on insulin sliding scale for glucose cover and monitoring.  Her glucose remained well controlled.   Obesity, class 3 Calculated BMI is 62,7   Iron deficiency anemia Continue with oral iron supplementation Discharge hgb is  8,9  Mild thrombocytopenia at 140, follow cell count in 10 days.          Consultants: Cardiology  Procedures performed: direct current cardioversion   Disposition: Rehabilitation facility Diet recommendation:  Discharge Diet Orders (From admission, onward)     Start     Ordered   04/30/23 0000  Diet - low sodium heart healthy        04/30/23 1003           Cardiac and Carb modified  diet DISCHARGE MEDICATION: Allergies as of 04/30/2023       Reactions   Gabapentin Anaphylaxis, Nausea And Vomiting, Other (See Comments)   "Cannot walk"   Topiramate Other (See Comments)   Jaw tightness and chest discomfort    Albuterol Other (See Comments)   CAN tolerate levalbuterol   Hydroxyzine Other (See Comments)   QT prolongation related   Imitrex [sumatriptan] Nausea And Vomiting, Other (See Comments)   Increased heart rate   Latex Itching   Montelukast Other (See Comments)   Nightmares    Other Itching, Other (See Comments)   Plastic and artificial Christmas trees = Itching   Prozac [fluoxetine] Other (See Comments)   Bad, vivid dreams   Trazodone And Nefazodone Other (See Comments)   Bad dreams 01/27/20 Pt is unsure if allergic to Nefazodone   Advair Diskus [fluticasone-salmeterol] Palpitations   Copper-containing Compounds Rash, Other (See Comments)   Makes the face burn, also        Medication List     STOP taking these medications    Breo Ellipta 200-25 MCG/ACT Aepb Generic drug: fluticasone furoate-vilanterol   Ozempic (0.25 or 0.5 MG/DOSE) 2 MG/3ML Sopn Generic drug: Semaglutide(0.25 or 0.5MG /DOS)  propranolol 80 MG tablet Commonly known as: INDERAL   Transderm-Scop 1 MG/3DAYS Generic drug: scopolamine       TAKE these medications    acetaminophen 500 MG tablet Commonly known as: TYLENOL Take 1,000 mg by mouth See admin instructions. Take 1,000 mg by mouth in the morning and at bedtime and an additional 1,000 mg once a day as needed for pain   apixaban 5 MG Tabs tablet Commonly known as: ELIQUIS Take 1 tablet (5 mg total) by mouth 2 (two) times daily.   BARIATRIC MULTIVITAMINS/IRON PO Take 1 tablet by mouth daily with breakfast.   bumetanide 1 MG tablet Commonly known as: BUMEX Take 1 tablet (1 mg total) by mouth 2 (two) times daily. What changed: when to take this   clotrimazole 1 % vaginal cream Commonly known as:  GYNE-LOTRIMIN Place 1 Applicatorful vaginally at bedtime for 7 days.   Dulera 200-5 MCG/ACT Aero Generic drug: mometasone-formoterol Inhale 2 puffs into the lungs in the morning and at bedtime.   Dupixent 300 MG/2ML Soaj Generic drug: Dupilumab Inject 300 mg into the skin every 14 (fourteen) days.   iron polysaccharides 150 MG capsule Commonly known as: NIFEREX Take 1 capsule by mouth daily.   magnesium oxide 400 MG tablet Commonly known as: MAG-OX Take 400 mg by mouth daily.   Melatonin 10 MG Tabs Take 10 mg by mouth at bedtime.   metoprolol succinate 25 MG 24 hr tablet Commonly known as: TOPROL-XL Take 3 tablets (75 mg total) by mouth daily.   nystatin powder Commonly known as: MYCOSTATIN/NYSTOP Apply topically 2 (two) times daily. What changed:  how much to take when to take this   pantoprazole 40 MG tablet Commonly known as: PROTONIX Take 1 tablet (40 mg total) by mouth 2 (two) times daily.   potassium chloride SA 20 MEQ tablet Commonly known as: KLOR-CON M Take 1 tablet (20 mEq total) by mouth daily. What changed: how much to take   pregabalin 200 MG capsule Commonly known as: LYRICA Take 200 mg by mouth 2 (two) times daily.   silver sulfADIAZINE 1 % cream Commonly known as: SILVADENE Apply 1 Application topically daily.   spironolactone 25 MG tablet Commonly known as: ALDACTONE Take 1 tablet (25 mg total) by mouth daily. What changed: when to take this   Ventolin HFA 108 (90 Base) MCG/ACT inhaler Generic drug: albuterol 2 puffs every 6 (six) hours as needed for shortness of breath or wheezing.   Voltaren Arthritis Pain 1 % Gel Generic drug: diclofenac Sodium Apply 2 g topically See admin instructions. Apply 2 grams to affected areas in the morning and at bedtime   Xiidra 5 % Soln Generic drug: Lifitegrast Place 1 drop into both eyes in the morning and at bedtime.   ZyrTEC Allergy 10 MG tablet Generic drug: cetirizine Take 10 mg by mouth daily  as needed (for seasonal allergies).        Discharge Exam: Filed Weights   04/28/23 0339 04/29/23 0630 04/30/23 0652  Weight: (!) 185.6 kg (!) 171.1 kg (!) 167 kg   BP 120/61   Pulse 80   Temp 98.5 F (36.9 C) (Oral)   Resp 16   Ht 5\' 5"  (1.651 m)   Wt (!) 167 kg   LMP 12/20/2017   SpO2 97%   BMI 61.27 kg/m   Patient with improvement in her symptoms, no PND, orthopnea or dyspnea. Continue very weak and deconditioned.   Neurology awake and alert ENT with no pallor  Cardiovascular with S1 and S2 present and regular with no gallops, rubs or murmurs No JVD No lower extremity edema Respiratory with no rales or wheezing, no rhonchi on anterior auscultation  Abdomen with no distention   Condition at discharge: stable  The results of significant diagnostics from this hospitalization (including imaging, microbiology, ancillary and laboratory) are listed below for reference.   Imaging Studies: EP STUDY Result Date: 04/28/2023 See surgical note for result.  DG Chest Port 1 View Result Date: 04/26/2023 CLINICAL DATA:  Shortness of breath EXAM: PORTABLE CHEST 1 VIEW COMPARISON:  Film from the previous day. FINDINGS: Cardiac shadow is stable. Increasing vascular congestion is noted with mild edema consistent with congestive failure. No sizable effusion is seen. No bony abnormality is noted. IMPRESSION: Mild CHF. Electronically Signed   By: Alcide Clever M.D.   On: 04/26/2023 03:31   DG CHEST PORT 1 VIEW Result Date: 04/25/2023 CLINICAL DATA:  [id number] Fever [id number] EXAM: PORTABLE CHEST 1 VIEW COMPARISON:  April 23, 2023 FINDINGS: The cardiomediastinal silhouette is unchanged and enlarged in contour. No pleural effusion. No pneumothorax. Similar appearance of scattered bandlike opacities bilaterally. IMPRESSION: Similar appearance of scattered bandlike opacities bilaterally, most likely atelectasis. Electronically Signed   By: Meda Klinefelter M.D.   On: 04/25/2023 11:54    ECHOCARDIOGRAM COMPLETE Result Date: 04/24/2023    ECHOCARDIOGRAM REPORT   Patient Name:   Aralynn RENEE Yuan Date of Exam: 04/24/2023 Medical Rec #:  [id number]         Height:       65.0 in Accession #:    [id number]        Weight:       442.9 lb Date of Birth:  [birth year]         BSA:          2.773 m Patient Age:    52 years          BP:           116/58 mmHg Patient Gender: F                 HR:           99 bpm. Exam Location:  Inpatient Procedure: 2D Echo (Both Spectral and Color Flow Doppler were utilized during            procedure). Indications:    congestive heart failure  History:        Patient has prior history of Echocardiogram examinations, most                 recent 08/05/2019. Arrythmias:Atrial Fibrillation; Risk                 Factors:Diabetes.  Sonographer:    Delcie Roch RDCS Referring Phys: [id number] Lynda Rainwater A ACHARYA IMPRESSIONS  1. Left ventricular ejection fraction, by estimation, is 70 to 75%. The left ventricle has hyperdynamic function. The left ventricle has no regional wall motion abnormalities. There is mild left ventricular hypertrophy. Left ventricular diastolic parameters were normal (medial e' 9.7 cm/s).  2. Right ventricular systolic function is normal. The right ventricular size is normal. There is mildly elevated pulmonary artery systolic pressure. The estimated right ventricular systolic pressure is 38.5 mmHg.  3. The mitral valve is grossly normal. Trivial mitral valve regurgitation. No evidence of mitral stenosis.  4. The aortic valve was not well visualized. There is mild calcification of the aortic valve. Aortic valve regurgitation is not visualized. No aortic stenosis is present.  5. The inferior vena cava is dilated in size with >50% respiratory variability, suggesting right atrial pressure of 8 mmHg.  6. Increased flow velocities may be secondary to anemia, thyrotoxicosis, hyperdynamic or high flow state. FINDINGS  Left Ventricle: Left ventricular ejection  fraction, by estimation, is 70 to 75%. The left ventricle has hyperdynamic function. The left ventricle has no regional wall motion abnormalities. The left ventricular internal cavity size was normal in size. There is mild left ventricular hypertrophy. Left ventricular diastolic parameters were normal. Right Ventricle: The right ventricular size is normal. No increase in right ventricular wall thickness. Right ventricular systolic function is normal. There is mildly elevated pulmonary artery systolic pressure. The tricuspid regurgitant velocity is 2.76  m/s, and with an assumed right atrial pressure of 8 mmHg, the estimated right ventricular systolic pressure is 38.5 mmHg. Left Atrium: Left atrial size was normal in size. Right Atrium: Right atrial size was normal in size. Pericardium: There is no evidence of pericardial effusion. Mitral Valve: The mitral valve is grossly normal. Trivial mitral valve regurgitation. No evidence of mitral valve stenosis. Tricuspid Valve: The tricuspid valve is normal in structure. Tricuspid valve regurgitation is trivial. No evidence of tricuspid stenosis. Aortic Valve: The aortic valve was not well visualized. There is mild calcification of the aortic valve. Aortic valve regurgitation is not visualized. No aortic stenosis is present. Aortic valve mean gradient measures 15.0 mmHg. Aortic valve peak gradient measures 27.4 mmHg. Aortic valve area, by VTI measures 2.19 cm. Pulmonic Valve: The pulmonic valve was not well visualized. Pulmonic valve regurgitation is trivial. No evidence of pulmonic stenosis. Aorta: The aortic root is normal in size and structure. Venous: The inferior vena cava is dilated in size with greater than 50% respiratory variability, suggesting right atrial pressure of 8 mmHg. IAS/Shunts: The interatrial septum was not well visualized.  LEFT VENTRICLE PLAX 2D LVIDd:         4.90 cm   Diastology LVIDs:         3.40 cm   LV e' medial:    9.68 cm/s LV PW:         1.20  cm   LV E/e' medial:  14.4 LV IVS:        1.10 cm   LV e' lateral:   15.00 cm/s LVOT diam:     1.80 cm   LV E/e' lateral: 9.3 LV SV:         91 LV SV Index:   33 LVOT Area:     2.54 cm  RIGHT VENTRICLE             IVC RV Basal diam:  2.80 cm     IVC diam: 2.20 cm RV S prime:     17.50 cm/s TAPSE (M-mode): 3.0 cm LEFT ATRIUM             Index        RIGHT ATRIUM           Index LA diam:        4.20 cm 1.51 cm/m   RA Area:     15.50 cm LA Vol (A2C):   42.7 ml 15.40 ml/m  RA Volume:   37.40 ml  13.49 ml/m LA Vol (A4C):   70.8 ml 25.53 ml/m LA Biplane Vol: 56.2 ml 20.26 ml/m  AORTIC VALVE AV Area (Vmax):    2.05 cm AV Area (Vmean):   2.12 cm AV Area (VTI):     2.19 cm AV Vmax:  261.50 cm/s AV Vmean:          181.000 cm/s AV VTI:            0.417 m AV Peak Grad:      27.4 mmHg AV Mean Grad:      15.0 mmHg LVOT Vmax:         211.00 cm/s LVOT Vmean:        150.500 cm/s LVOT VTI:          0.359 m LVOT/AV VTI ratio: 0.86  AORTA Ao Root diam: 3.00 cm Ao Asc diam:  3.70 cm MITRAL VALVE                TRICUSPID VALVE MV Area (PHT): 4.60 cm     TR Peak grad:   30.5 mmHg MV Decel Time: 165 msec     TR Vmax:        276.00 cm/s MV E velocity: 139.00 cm/s MV A velocity: 117.00 cm/s  SHUNTS MV E/A ratio:  1.19         Systemic VTI:  0.36 m                             Systemic Diam: 1.80 cm Weston Brass MD Electronically signed by Weston Brass MD Signature Date/Time: 04/24/2023/6:20:08 PM    Final    DG Chest Port 1 View Result Date: 04/23/2023 CLINICAL DATA:  Shortness of breath, fluid overload EXAM: PORTABLE CHEST 1 VIEW COMPARISON:  03/10/2023 FINDINGS: Mild enlargement of the cardiopericardial silhouette. Airway thickening is present, suggesting bronchitis or reactive airways disease. Stable bandlike densities in both mid lungs favoring scarring. No blunting of the costophrenic angles. Indistinct pulmonary vasculature, cannot exclude pulmonary venous hypertension. No overt edema. Lower thoracic  spondylosis. IMPRESSION: 1. Mild enlargement of the cardiopericardial silhouette with indistinct pulmonary vasculature, cannot exclude pulmonary venous hypertension. No overt edema. 2. Airway thickening is present, suggesting bronchitis or reactive airways disease. 3. Stable bandlike densities in both mid lungs favoring scarring. Electronically Signed   By: Gaylyn Rong M.D.   On: 04/23/2023 17:54    Microbiology: Results for orders placed or performed during the hospital encounter of 04/23/23  Resp panel by RT-PCR (RSV, Flu A&B, Covid) Anterior Nasal Swab     Status: None   Collection Time: 04/23/23  5:57 PM   Specimen: Anterior Nasal Swab  Result Value Ref Range Status   SARS Coronavirus 2 by RT PCR NEGATIVE NEGATIVE Final   Influenza A by PCR NEGATIVE NEGATIVE Final   Influenza B by PCR NEGATIVE NEGATIVE Final    Comment: (NOTE) The Xpert Xpress SARS-CoV-2/FLU/RSV plus assay is intended as an aid in the diagnosis of influenza from Nasopharyngeal swab specimens and should not be used as a sole basis for treatment. Nasal washings and aspirates are unacceptable for Xpert Xpress SARS-CoV-2/FLU/RSV testing.  Fact Sheet for Patients: BloggerCourse.com  Fact Sheet for Healthcare Providers: SeriousBroker.it  This test is not yet approved or cleared by the [place] FDA and has been authorized for detection and/or diagnosis of SARS-CoV-2 by FDA under an Emergency Use Authorization (EUA). This EUA will remain in effect (meaning this test can be used) for the duration of the COVID-19 declaration under Section 564(b)(1) of the Act, 21 U.S.C. section 360bbb-3(b)(1), unless the authorization is terminated or revoked.     Resp Syncytial Virus by PCR NEGATIVE NEGATIVE Final    Comment: (NOTE) Fact Sheet for Patients: BloggerCourse.com  Fact Sheet for Healthcare  Providers: SeriousBroker.it  This test is not yet approved or cleared by the [place] FDA and has been authorized for detection and/or diagnosis of SARS-CoV-2 by FDA under an Emergency Use Authorization (EUA). This EUA will remain in effect (meaning this test can be used) for the duration of the COVID-19 declaration under Section 564(b)(1) of the Act, 21 U.S.C. section 360bbb-3(b)(1), unless the authorization is terminated or revoked.  Performed at [facility], [address] [place], [place] [address]   Urine Culture (for pregnant, neutropenic or urologic patients or patients with an indwelling urinary catheter)     Status: Abnormal   Collection Time: 04/25/23  7:48 AM   Specimen: Urine, Clean Catch  Result Value Ref Range Status   Specimen Description URINE, CLEAN CATCH  Final   Special Requests NONE  Final   Culture (A)  Final    >=100,000 COLONIES/mL ESCHERICHIA COLI WITHIN MIXED ORGANISMS Performed at [facility], [address] [place], [place] [address]    Report Status 04/27/2023 FINAL  Final   Organism ID, Bacteria ESCHERICHIA COLI (A)  Final      Susceptibility   Escherichia coli - MIC*    AMPICILLIN <=2 SENSITIVE Sensitive     CEFAZOLIN <=4 SENSITIVE Sensitive     CEFEPIME <=0.12 SENSITIVE Sensitive     CEFTRIAXONE <=0.25 SENSITIVE Sensitive     CIPROFLOXACIN <=0.25 SENSITIVE Sensitive     GENTAMICIN <=1 SENSITIVE Sensitive     IMIPENEM <=0.25 SENSITIVE Sensitive     NITROFURANTOIN <=16 SENSITIVE Sensitive     TRIMETH/SULFA >=320 RESISTANT Resistant     AMPICILLIN/SULBACTAM <=2 SENSITIVE Sensitive     PIP/TAZO <=4 SENSITIVE Sensitive ug/mL    * >=100,000 COLONIES/mL ESCHERICHIA COLI  Culture, blood (Routine X 2) w Reflex to ID Panel     Status: None   Collection Time: 04/25/23  8:12 AM   Specimen: BLOOD RIGHT HAND  Result Value Ref Range Status   Specimen Description BLOOD RIGHT HAND  Final   Special Requests    Final    BOTTLES DRAWN AEROBIC AND ANAEROBIC Blood Culture results may not be optimal due to an inadequate volume of blood received in culture bottles   Culture   Final    NO GROWTH 5 DAYS Performed at [facility], [address] [place], [place] [address]    Report Status 04/30/2023 FINAL  Final  Culture, blood (Routine X 2) w Reflex to ID Panel     Status: None   Collection Time: 04/25/23  8:16 AM   Specimen: BLOOD RIGHT ARM  Result Value Ref Range Status   Specimen Description BLOOD RIGHT ARM  Final   Special Requests   Final    BOTTLES DRAWN AEROBIC AND ANAEROBIC Blood Culture results may not be optimal due to an inadequate volume of blood received in culture bottles   Culture   Final    NO GROWTH 5 DAYS Performed at [facility], [address], [place], [place] [address]    Report Status 04/30/2023 FINAL  Final  C Difficile Quick Screen w PCR reflex     Status: None   Collection Time: 04/26/23  4:15 AM   Specimen: STOOL  Result Value Ref Range Status   C Diff antigen NEGATIVE NEGATIVE Final   C Diff toxin NEGATIVE NEGATIVE Final   C Diff interpretation No C. difficile detected.  Final    Comment: Performed at [facility], [address], [place], [place] [address]  Gastrointestinal Panel by PCR , Stool     Status: Abnormal   Collection Time: 04/26/23  4:15 AM   Specimen: Stool  Result Value Ref Range Status   Campylobacter species NOT DETECTED NOT DETECTED Final   Plesimonas shigelloides NOT DETECTED NOT DETECTED Final   Salmonella species NOT DETECTED NOT DETECTED Final   Yersinia enterocolitica NOT DETECTED NOT DETECTED Final   Vibrio species NOT DETECTED NOT DETECTED Final   Vibrio cholerae NOT DETECTED NOT DETECTED Final   Enteroaggregative E coli (EAEC) NOT DETECTED NOT DETECTED Final   Enteropathogenic E coli (EPEC) NOT DETECTED NOT DETECTED Final   Enterotoxigenic E coli (ETEC) NOT DETECTED NOT DETECTED Final   Shiga like toxin producing E  coli (STEC) NOT DETECTED NOT DETECTED Final   Shigella/Enteroinvasive E coli (EIEC) NOT DETECTED NOT DETECTED Final   Cryptosporidium NOT DETECTED NOT DETECTED Final   Cyclospora cayetanensis NOT DETECTED NOT DETECTED Final   Entamoeba histolytica NOT DETECTED NOT DETECTED Final   Giardia lamblia NOT DETECTED NOT DETECTED Final   Adenovirus F40/41 NOT DETECTED NOT DETECTED Final   Astrovirus NOT DETECTED NOT DETECTED Final   Norovirus GI/GII DETECTED (A) NOT DETECTED Final    Comment: CRITICAL RESULT CALLED TO, READ BACK BY AND VERIFIED WITH: Billey Gosling RN [facility] @ [phone] 04/27/23 LFD    Rotavirus A NOT DETECTED NOT DETECTED Final   Sapovirus (I, II, IV, and V) NOT DETECTED NOT DETECTED Final    Comment: Performed at [facility], [address] [place], [place] [address]    Labs: CBC: Recent Labs  Lab 04/25/23 0201 04/26/23 0204 04/27/23 0213 04/28/23 0221 04/29/23 0225  WBC 6.1 8.6 7.6 7.0 7.3  NEUTROABS  --  6.0 5.0 4.4 5.0  HGB 9.5* 8.8* 9.1* 9.3* 8.9*  HCT 31.6* 29.2* 30.3* 31.0* 29.3*  MCV 86.8 87.7 87.3 87.8 88.0  PLT 166 128* 141* 134* 140*   Basic Metabolic Panel: Recent Labs  Lab 04/26/23 1751 04/27/23 0213 04/28/23 0221 04/29/23 0225 04/30/23 0219  NA 134* 134* 134* 134* 136  K 4.0 3.4* 3.8 3.4* 3.8  CL 96* 92* 91* 93* 92*  CO2 27 31 29  32 32  GLUCOSE 170* 133* 126* 139* 142*  BUN 8 8 7 7 9   CREATININE 0.84 0.75 0.76 1.16* 0.75  CALCIUM 7.7* 7.5* 8.0* 8.2* 8.4*  MG 2.0 1.7 1.7 1.9 1.7  PHOS  --  2.8 3.3 3.2  --    Liver Function Tests: Recent Labs  Lab 04/23/23 2000 04/27/23 0213 04/28/23 0221 04/29/23 0225  AST 44*  --   --   --   ALT 28  --   --   --   ALKPHOS 88  --   --   --   BILITOT 1.1  --   --   --   PROT 6.4*  --   --   --   ALBUMIN 2.9* 2.5* 2.7* 2.6*   CBG: Recent Labs  Lab 04/29/23 0631 04/29/23 1119 04/29/23 1613 04/29/23 2147 04/30/23 0623  GLUCAP 124* 149* 184* 211* 109*    Discharge time spent: greater than  30 minutes.  Signed: Coralie Keens, MD [facility] 04/30/2023

## 2023-04-30 NOTE — Progress Notes (Signed)
 Genice Rouge, MD  Physician Physical Medicine and Rehabilitation   PMR Pre-admission    Signed   Date of Service: 04/29/2023  3:47 PM  Related encounter: ED to Hosp-Admission (Current) from 04/23/2023 in [facility] PROGRESSIVE CARE   Signed     Expand All Collapse All  Show:Clear all [x] Written[x] Templated[x] Copied  Added by: [x] Dameshia Seybold, Tye [place], RN[x] Genice Rouge, MD  [] Hover for details PMR Admission Coordinator Pre-Admission Assessment   Patient: Alyssa Blanchard is an 53 y.o., female MRN: [id number] DOB: [birth year] Height: 5\' 5"  (165.1 cm) Weight: (!) 167 kg   Insurance Information HMO:     PPO:      PCP:      IPA:      80/20:      OTHER:  PRIMARY: [place] Medicaid [employer]      Policy#: [id number]      Subscriber: pt CM Name: Burgess Amor     Phone#: [phone]     Fax#: [phone] or email [email or web address] Pre-Cert#: [id number] approved for 6 days 2/20 until 2/26. Next review date is 2/25      Employer:  Benefits:  Phone #: [phone] and online [email or web address]     Name: 2/19 Eff. Date: 09/08/22 until 09/07/23     Deduct: none      Out of Pocket Max: none      Life Max: none CIR: 100% per medicaid      SNF: 100% per medicaid, first 90 days covered by payor then members move to [facility] Outpatient: 100%     Co-Pay: 27 visits per year combined Home Health: 100%      Co-Pay: 100 total visits DME: 100%     Co-Pay: per medical neccesity Providers: in network  SECONDARY: none      Policy#:      Phone#:    Artist:       Phone#:    The Data processing manager" for patients in Inpatient Rehabilitation Facilities with attached "Privacy Act Statement-Health Care Records" was provided and verbally reviewed with: Patient   Emergency Contact Information Contact Information       Name Relation Home Work Mobile    Tropea,Ruth A Mother [phone]             Other Contacts   None on  File      Current Medical History  Patient Admitting Diagnosis: Debility   History of Present Illness:  53 year old right-handed female well-known to inpatient rehab services with admission 02/28/2022 - 03/11/2022 for debility/morbid obesity with BMI 62.77, right lower extremity cellulitis as well as history of chronic diastolic congestive heart failure, anemia, PAF on Eliquis, asthma, type 2 diabetes mellitus, OSA with CPAP, hypertension, depression/anxiety, quit smoking 11 years ago.  Recent admission at [facility] 03/10/18/2025 - 03/17/2023 for acute on chronic congestive heart failure exacerbation and was diuresed aggressively.  Weight was 206 kg on admission at that time down to 179 kg on discharge.Presented 04/23/2023 with increasing shortness of breath with progressive fluid buildup lower extremities x 1 week.     Chest x-ray showed mild enlargement of the cardiopericardial silhouette with indistinct pulmonary vascular could not exclude pulmonary venous hypertension.  No overt edema.  Admission chemistries unremarkable except hemoglobin 9.3, BNP 66.4, troponin negative, blood cultures no growth to date.  Echocardiogram ejection fraction of 70 to 75%.  Left ventricle hyperdynamic function.  No evidence of mitral stenosis.  Hospital course complicated by A-fib with RVR follow-up per cardiology services  started on diltiazem and amiodarone for control.  Patient did require electrical cardioversion 04/28/2023 per Dr. [name].  She presently remains on Eliquis.  Patient she spiked a fever on 04/25/2023 with nausea and emesis.  GI pathogen panel positive for norovirus.  Blood cultures remain no growth to date, urine culture greater 100,000 E. coli..  Intravenous Rocephin initiated transition to oral antibiotics.  Right thigh cellulitis.  And again patient continues on Rocephin as noted and again transition to oral antibiotics.    Patient's medical record from [facility] has been reviewed by the  rehabilitation admission coordinator and physician.   Past Medical History      Past Medical History:  Diagnosis Date   Anxiety     Asthma     Atrial fibrillation (HCC)     Depression     DM (diabetes mellitus), type 2 (HCC) 02/16/2022   Dysrhythmia      new onset Afib rvr   GERD (gastroesophageal reflux disease)     Heart failure (HCC)     Heel spur      bilat   History of bronchitis     History of chicken pox     History of urinary tract infection     Hypertension     Migraines     Plantar fasciitis      bilat   STD (sexually transmitted disease)      chl hx & hsv 1&2   Tremors of nervous system     Urinary incontinence          Has the patient had major surgery during 100 days prior to admission? No   Family History   family history includes Alzheimer's disease in her paternal grandmother; Cancer in her maternal uncle and paternal uncle; Diabetes in her maternal grandmother and mother; Heart attack in her maternal grandfather; Heart failure in her maternal grandmother; Hyperlipidemia in her mother; Hypertension in her maternal grandfather, maternal grandmother, mother, paternal grandfather, and paternal grandmother; Kidney disease in her mother; Pancreatic cancer in her father.   Current Medications  Current Medications    Current Facility-Administered Medications:    acetaminophen (TYLENOL) tablet 650 mg, 650 mg, Oral, Q6H PRN, 650 mg at 04/29/23 2236 **OR** acetaminophen (TYLENOL) suppository 650 mg, 650 mg, Rectal, Q6H PRN, Charlsie Quest, MD   apixaban (ELIQUIS) tablet 5 mg, 5 mg, Oral, BID, Darreld Mclean R, MD, 5 mg at 04/30/23 0944   bisacodyl (DULCOLAX) EC tablet 5 mg, 5 mg, Oral, Daily PRN, Charlsie Quest, MD   bumetanide (BUMEX) tablet 1 mg, 1 mg, Oral, BID, Arrien, York Ram, MD, 1 mg at 04/30/23 0945   diclofenac Sodium (VOLTAREN) 1 % topical gel 2 g, 2 g, Topical, BID, Rodolph Bong, MD, 2 g at 04/30/23 0953   HYDROcodone-acetaminophen (NORCO)  10-325 MG per tablet 1-2 tablet, 1-2 tablet, Oral, Q6H PRN, Darreld Mclean R, MD, 1 tablet at 04/30/23 0944   insulin aspart (novoLOG) injection 0-5 Units, 0-5 Units, Subcutaneous, QHS, Darreld Mclean R, MD, 2 Units at 04/29/23 2236   insulin aspart (novoLOG) injection 0-9 Units, 0-9 Units, Subcutaneous, TID WC, Charlsie Quest, MD, 2 Units at 04/29/23 1747   iron polysaccharides (NIFEREX) capsule 150 mg, 150 mg, Oral, Daily, Rodolph Bong, MD, 150 mg at 04/25/23 0953   levalbuterol (XOPENEX) nebulizer solution 0.63 mg, 0.63 mg, Inhalation, Q6H PRN, Darreld Mclean R, MD   magnesium oxide (MAG-OX) tablet 400 mg, 400 mg, Oral, Daily, Rodolph Bong,  MD, 400 mg at 04/30/23 0944   magnesium sulfate IVPB 2 g 50 mL, 2 g, Intravenous, Once, Arrien, York Ram, MD, Last Rate: 50 mL/hr at 04/30/23 0943, 2 g at 04/30/23 0943   melatonin tablet 10 mg, 10 mg, Oral, QHS, Rodolph Bong, MD, 10 mg at 04/29/23 2235   metoprolol tartrate (LOPRESSOR) tablet 25 mg, 25 mg, Oral, TID, Weston Brass A, MD, 25 mg at 04/30/23 0944   mometasone-formoterol (DULERA) 200-5 MCG/ACT inhaler 1 puff, 1 puff, Inhalation, Daily, Charlsie Quest, MD, 1 puff at 04/29/23 0858   morphine (PF) 2 MG/ML injection 2 mg, 2 mg, Intravenous, Q3H PRN, Charlsie Quest, MD, 2 mg at 04/24/23 0122   Oral care mouth rinse, 15 mL, Mouth Rinse, PRN, Rodolph Bong, MD   pantoprazole (PROTONIX) EC tablet 40 mg, 40 mg, Oral, BID, Pham, Minh Q, RPH-CPP, 40 mg at 04/30/23 0944   pregabalin (LYRICA) capsule 200 mg, 200 mg, Oral, BID, Rodolph Bong, MD, 200 mg at 04/30/23 1610   prochlorperazine (COMPAZINE) injection 10 mg, 10 mg, Intravenous, Q6H PRN, Charlsie Quest, MD, 10 mg at 04/26/23 0308   senna-docusate (Senokot-S) tablet 1 tablet, 1 tablet, Oral, QHS PRN, Charlsie Quest, MD   silver sulfADIAZINE (SILVADENE) 1 % cream, , Topical, Daily, Arrien, York Ram, MD, Given at 04/30/23 [phone]   sodium chloride flush (NS) 0.9  % injection 3 mL, 3 mL, Intravenous, Q12H, Charlsie Quest, MD, 3 mL at 04/30/23 5409   spironolactone (ALDACTONE) tablet 25 mg, 25 mg, Oral, Daily, Abagail Kitchens, PA-C, 25 mg at 04/30/23 8119   Facility-Administered Medications Ordered in Other Encounters:    acetaminophen (TYLENOL) tablet 650 mg, 650 mg, Oral, Once, Thayil, Irene T, PA-C   ferric derisomaltose (MONOFERRIC) 1,000 mg in sodium chloride 0.9 % 100 mL infusion, 1,000 mg, Intravenous, Once, Thayil, Irene T, PA-C     Patients Current Diet:  Diet Order                  DIET DYS 3 Room service appropriate? Yes; Fluid consistency: Thin  Diet effective now                       Precautions / Restrictions Precautions Precautions: Fall Restrictions Weight Bearing Restrictions Per Provider Order: No    Has the patient had 2 or more falls or a fall with injury in the past year? No   Prior Activity Level Limited Community (1-2x/wk): Mod I with occasional use of cane   Prior Functional Level Self Care: Did the patient need help bathing, dressing, using the toilet or eating? Independent   Indoor Mobility: Did the patient need assistance with walking from room to room (with or without device)? Independent   Stairs: Did the patient need assistance with internal or external stairs (with or without device)? Independent   Functional Cognition: Did the patient need help planning regular tasks such as shopping or remembering to take medications? Independent   Patient Information Are you of Hispanic, Latino/a,or Spanish origin?: A. No, not of Hispanic, Latino/a, or Spanish origin What is your race?: A. White Do you need or want an interpreter to communicate with a doctor or health care staff?: 0. No   Patient's Response To:  Health Literacy and Transportation Is the patient able to respond to health literacy and transportation needs?: Yes Health Literacy - How often do you need to have someone help you when you read  instructions, pamphlets, or other written material from your doctor or pharmacy?: Rarely In the past 12 months, has lack of transportation kept you from medical appointments or from getting medications?: Yes In the past 12 months, has lack of transportation kept you from meetings, work, or from getting things needed for daily living?: Yes   Journalist, newspaper / Equipment Home Equipment: Shower seat, Agricultural consultant (2 wheels), [place] - single point, BSC/3in1, Hospital bed   Prior Device Use: Indicate devices/aids used by the patient prior to current illness, exacerbation or injury?  Occasional use of cane   Current Functional Level Cognition   Orientation Level: Oriented X4    Extremity Assessment (includes Sensation/Coordination)   Upper Extremity Assessment: Defer to OT evaluation  Lower Extremity Assessment: Generalized weakness     ADLs   Overall ADL's : Needs assistance/impaired Eating/Feeding: Independent Grooming: Set up, Sitting Upper Body Bathing: Minimal assistance, With adaptive equipment, Sitting Lower Body Bathing: Moderate assistance, With adaptive equipment, Sitting/lateral leans Upper Body Dressing : Minimal assistance, Sitting Lower Body Dressing: Maximal assistance, Sit to/from stand Toilet Transfer: Contact guard assist, +2 for safety/equipment, Stand-pivot, BSC/3in1, Rolling walker (2 wheels) Toileting- Clothing Manipulation and Hygiene: Moderate assistance, Sitting/lateral lean General ADL Comments: Pt states due to edema LB ADLs have been more difficult, PLOF independent. Pt has good overall strength, able to stand at bedside CGA,     Mobility   Overal bed mobility: Needs Assistance Bed Mobility: Supine to Sit, Sit to Supine Supine to sit: Contact guard Sit to supine: Contact guard assist General bed mobility comments: CGA, increased time, use of bed rails and HOB elevated     Transfers   Overall transfer level: Needs assistance Equipment used: Rolling  walker (2 wheels) Transfers: Sit to/from Stand Sit to Stand: Contact guard assist, Min assist General transfer comment: CGA for safety and MIN A for balance in standing and to adjust feet for improved balance due to wide BOS. Verbal cues for safe hand placement with standing to prevent pulling up from RW.     Ambulation / Gait / Stairs / Wheelchair Mobility   Ambulation/Gait Ambulation/Gait assistance: Clinical research associate (Feet): 10 Feet Assistive device: Rolling walker (2 wheels) Gait Pattern/deviations: Step-through pattern, Decreased stride length, Trunk flexed, Wide base of support General Gait Details: short partial step through gait pattern with flexed trunk, increased BOS and standing rest break to brush teeth between ambulation. Pt was limited due to pt requires a bariatric RW Gait velocity: decreased Gait velocity interpretation: <1.31 ft/sec, indicative of household ambulator     Posture / Balance Balance Overall balance assessment: Needs assistance Sitting-balance support: No upper extremity supported, Feet supported Sitting balance-Leahy Scale: Fair Standing balance support: Single extremity supported, During functional activity, Bilateral upper extremity supported Standing balance-Leahy Scale: Fair Standing balance comment: able to stand at sink with forearm support to brush teeth with CGA     Special needs/care consideration Diabetic ulcer thigh anterior, distal, right scabbed wound on outer thigh noted 2/14 Enteric Precautions Bariatric Specility bed overlay mattress    Previous Home Environment  Living Arrangements:  (Mom)  Lives With: Family (Mom) Available Help at Discharge: Family, Available 24 hours/day (Mom) Type of Home: House Home Layout: One level, Other (Comment) Home Access: Stairs to enter Entrance Stairs-Rails: Can reach both Entrance Stairs-Number of Steps: 5 Bathroom Shower/Tub: Engineer, manufacturing systems: Handicapped  height Bathroom Accessibility: Yes How Accessible: Accessible via walker Home Care Services: No Additional Comments: Pt sleeps in hospital  bed which is currently broken, says she needs a new one. Lives with mother who is able to do light housework.   Discharge Living Setting Plans for Discharge Living Setting: Patient's home, Lives with (comment) (Mom) Type of Home at Discharge: House Discharge Home Layout: One level, Other (Comment) (has steps up and down to bathroom and living room) Discharge Home Access: Stairs to enter Entrance Stairs-Rails: Right, Left, Can reach both Entrance Stairs-Number of Steps: 5 Discharge Bathroom Shower/Tub: Tub/shower unit Discharge Bathroom Toilet: Handicapped height Discharge Bathroom Accessibility: Yes How Accessible: Accessible via walker Does the patient have any problems obtaining your medications?: No   Social/Family/Support Systems Contact Information: Mom, Windell Moulding Anticipated Caregiver: Mom, supervision assistance only Anticipated Caregiver's Contact Information: see contacts Ability/Limitations of Caregiver: can provide supervision as well as asssit with getting legs in and out of bed Caregiver Availability: 24/7 Discharge Plan Discussed with Primary Caregiver: Yes Is Caregiver In Agreement with Plan?: Yes Does Caregiver/Family have Issues with Lodging/Transportation while Pt is in Rehab?: No   Goals Patient/Family Goal for Rehab: Mod I to supervision with PT and OT Expected length of stay: ELOS 5 to 7 days Additional Information: bariatric Pt/Family Agrees to Admission and willing to participate: Yes Program Orientation Provided & Reviewed with Pt/Caregiver Including Roles  & Responsibilities: Yes   Decrease burden of Care through IP rehab admission: n/a   Possible need for SNF placement upon discharge: not anticipated   Patient Condition: I have reviewed medical records from [facility], spoken with CM, and patient. I met with  patient at the bedside for inpatient rehabilitation assessment.  Patient will benefit from ongoing PT and OT, can actively participate in 3 hours of therapy a day 5 days of the week, and can make measurable gains during the admission.  Patient will also benefit from the coordinated team approach during an Inpatient Acute Rehabilitation admission.  The patient will receive intensive therapy as well as Rehabilitation physician, nursing, social worker, and care management interventions.  Due to bladder management, bowel management, safety, skin/wound care, disease management, medication administration, pain management, and patient education the patient requires 24 hour a day rehabilitation nursing.  The patient is currently min assist overall with mobility and basic ADLs.  Discharge setting and therapy post discharge at home with home health is anticipated.  Patient has agreed to participate in the Acute Inpatient Rehabilitation Program and will admit today.   Preadmission Screen Completed By:  Clois Dupes, RN MSN 04/30/2023 9:55 AM ______________________________________________________________________   Discussed status with Dr. Berline Chough on 04/30/23 at [phone] and received approval for admission today.   Admission Coordinator:  Clois Dupes, RN MSN time 1914 Date 04/30/23    Assessment/Plan: Diagnosis: Debility due to [facility] exacerbation-  Does the need for close, 24 hr/day Medical supervision in concert with the patient's rehab needs make it unreasonable for this patient to be served in a less intensive setting? Yes Co-Morbidities requiring supervision/potential complications: dCHF, Asthma, pAfib on Eliquis; HTN, HLD; BMI 61 with class 3 morbid obesity, DM- don't have A1c; treated UTI and cellulitis Due to bladder management, bowel management, safety, skin/wound care, disease management, medication administration, pain management, and patient education, does the patient require 24 hr/day  rehab nursing? Yes Does the patient require coordinated care of a physician, rehab nurse, PT, OT, and SLP to address physical and functional deficits in the context of the above medical diagnosis(es)? Yes Addressing deficits in the following areas: balance, endurance, locomotion, strength, transferring, bowel/bladder control, bathing,  dressing, feeding, grooming, and toileting Can the patient actively participate in an intensive therapy program of at least 3 hrs of therapy 5 days a week? Yes The potential for patient to make measurable gains while on inpatient rehab is good Anticipated functional outcomes upon discharge from inpatient rehab: modified independent and supervision PT, modified independent and supervision OT, n/a SLP Estimated rehab length of stay to reach the above functional goals is: ~ 7 days Anticipated discharge destination: Home 10. Overall Rehab/Functional Prognosis: good     MD Signature:            Revision History

## 2023-04-30 NOTE — TOC Transition Note (Signed)
 Transition of Care [facility]) - Discharge Note   Patient Details  Name: Alyssa Blanchard MRN: [id number] Date of Birth: [birth year]  Transition of Care [facility]) CM/SW Contact:  Harriet Masson, RN Phone Number: 04/30/2023, 10:09 AM   Clinical Narrative:    Patient stable to transfer to CIR today.     Final next level of care: IP Rehab Facility Barriers to Discharge: Barriers Resolved   Patient Goals and CMS Choice Patient states their goals for this hospitalization and ongoing recovery are:: return home          Discharge Placement             CIR          Discharge Plan and Services Additional resources added to the After Visit Summary for                                       Social Drivers of Health (SDOH) Interventions SDOH Screenings   Food Insecurity: No Food Insecurity (04/24/2023)  Housing: Low Risk  (04/24/2023)  Transportation Needs: Unmet Transportation Needs (04/24/2023)  Utilities: Not At Risk (04/24/2023)  Alcohol Screen: Low Risk  (11/24/2022)  Financial Resource Strain: Patient Declined (11/24/2022)  Physical Activity: Inactive (01/29/2023)   Received from [facility]  Social Connections: Socially Isolated (11/24/2022)  Stress: Stress Concern Present (11/24/2022)  Tobacco Use: Medium Risk (04/28/2023)     Readmission Risk Interventions    02/28/2022   11:05 AM  Readmission Risk Prevention Plan  Transportation Screening Complete  PCP or Specialist Appt within 5-7 Days Complete  Home Care Screening Complete  Medication Review (RN CM) Complete

## 2023-04-30 NOTE — H&P (Signed)
 Physical Medicine and Rehabilitation Admission H&P        Chief Complaint  Patient presents with   Shortness of Breath  : HPI: Alyssa Blanchard is a 53 year old right-handed female well-known to inpatient rehab services with admission 02/28/2022 - 03/11/2022 for debility/morbid obesity with BMI 62.77/right lower extremity cellulitis as well as history of chronic diastolic congestive heart failure, anemia, PAF on Eliquis, asthma, type 2 diabetes mellitus, OSA with CPAP, hypertension, depression/anxiety, quit smoking 11 years ago.  Recent admission at [facility] 03/10/18/2025 - 03/17/2023 for acute on chronic congestive heart failure exacerbation and was diuresed aggressively.  Weight was 206 kg on admission at that time down to 179 kg on discharge.  Per chart review patient lives at home with his mother who is available 24/7.  Mother is able to do light housework.  Patient independent with a cane and driving prior to admission.  5 steps to entry of home.  Presented 04/23/2023 with increasing shortness of breath with progressive fluid buildup lower extremities x 1 week.  Chest x-ray showed mild enlargement of the cardiopericardial silhouette with indistinct pulmonary vascular could not exclude pulmonary venous hypertension.  No overt edema.  Admission chemistries unremarkable except hemoglobin 9.3, BNP 66.4, troponin negative, blood cultures no growth to date.  Echocardiogram ejection fraction of 70 to 75%.  Left ventricle hyperdynamic function.  No evidence of mitral stenosis.  Hospital course complicated by A-fib with RVR follow-up per cardiology services started on diltiazem and amiodarone for control.  Patient did require electrical cardioversion 04/28/2023 per Dr. [name].  She presently remains on Eliquis.  Patient she spiked a fever on 04/25/2023 with nausea and emesis.  GI pathogen panel positive for norovirus.  Blood cultures remain no growth to date, urine culture greater 100,000 E. coli..   Intravenous Rocephin initiated and completed.  Right thigh cellulitis and again patient continued on Rocephin until cpmpletion .  Therapy evaluations completed due to patient decreased functional mobility/debility was admitted for a comprehensive rehab program.     Pt reports is 92 lbs down fluid wise, but says when goes home that PO Lasix "doesn't do the job" of IV Lasix so reaccumulates- not on O2 at home   Pt had norovirus, but Sx's have been gone for 4-5 days and actually hasn't had a BM in 4 days- also has developed "a fear of the bedpan"- and needs BSC to go.  has pain in R>L knee; RLE due to edema; L upper trap and dry heels because they are cracked   Has noone to help assist her at home - plans on driving to her doctor's appt and to order from the store for her food- said her mother could help before, but cannot anymore.    Wants regular bed, not the bariatric air mattress and she says regular bed easier to transfer from (she's correct) and air mattress gets her stuck in 1 position.    They stopped IV Rocehphin/finished tx for cellulitis of RLE and UTI   Normally uses Dupixant- was due yesterday- will get real itchy without it- wondering if can have- cannot take Benadryl- makes her hyper and doesn't help itching - makes it worse Vistaril and Zyrtec, per pt, help, but Vistaril causes her QT prolongation per chart   Review of Systems  Constitutional:  Positive for malaise/fatigue and weight loss. Negative for fever.  HENT:  Negative for hearing loss.   Eyes:  Negative for blurred vision and double vision.  Respiratory:  Positive for shortness of breath. Negative for cough and wheezing.   Cardiovascular:  Positive for palpitations and leg swelling.  Gastrointestinal:  Positive for nausea and vomiting.       GERD Norovirus Sx's resolved 4 days ago  Genitourinary:  Positive for urgency. Negative for dysuria, flank pain and hematuria.       Stress urinary incontinence  Musculoskeletal:   Positive for back pain, joint pain and myalgias.  Neurological:  Positive for weakness.  Psychiatric/Behavioral:  Positive for depression.        Anxiety        Past Medical History:  Diagnosis Date   Anxiety     Asthma     Atrial fibrillation (HCC)     Depression     DM (diabetes mellitus), type 2 (HCC) 02/16/2022   Dysrhythmia      new onset Afib rvr   GERD (gastroesophageal reflux disease)     Heart failure (HCC)     Heel spur      bilat   History of bronchitis     History of chicken pox     History of urinary tract infection     Hypertension     Migraines     Plantar fasciitis      bilat   STD (sexually transmitted disease)      chl hx & hsv 1&2   Tremors of nervous system     Urinary incontinence               Past Surgical History:  Procedure Laterality Date   BIOPSY   10/14/2022    Procedure: BIOPSY;  Surgeon: Jenel Lucks, MD;  Location: [facility];  Service: Gastroenterology;;   CARDIOVERSION N/A 08/05/2019    Procedure: CARDIOVERSION;  Surgeon: Vesta Mixer, MD;  Location: [facility] ENDOSCOPY;  Service: Cardiovascular;  Laterality: N/A;   CARDIOVERSION N/A 04/28/2023    Procedure: CARDIOVERSION;  Surgeon: Jake Bathe, MD;  Location: [facility] INVASIVE CV LAB;  Service: Cardiovascular;  Laterality: N/A;   COLONOSCOPY WITH PROPOFOL N/A 10/14/2022    Procedure: COLONOSCOPY WITH PROPOFOL;  Surgeon: Jenel Lucks, MD;  Location: [facility] ENDOSCOPY;  Service: Gastroenterology;  Laterality: N/A;   ESOPHAGOGASTRODUODENOSCOPY (EGD) WITH PROPOFOL N/A 10/14/2022    Procedure: ESOPHAGOGASTRODUODENOSCOPY (EGD) WITH PROPOFOL;  Surgeon: Jenel Lucks, MD;  Location: [facility] ENDOSCOPY;  Service: Gastroenterology;  Laterality: N/A;   LAPAROSCOPIC GASTRIC SLEEVE RESECTION N/A 11/27/2014    Procedure: LAPAROSCOPIC GASTRIC SLEEVE RESECTION WITH HIATAL HERNIA REPAIR UPPER ENDOSCOPY;  Surgeon: Luretha Murphy, MD;  Location: [facility] ORS;  Service: General;  Laterality: N/A;   POLYPECTOMY    10/14/2022    Procedure: POLYPECTOMY;  Surgeon: Jenel Lucks, MD;  Location: [facility] ENDOSCOPY;  Service: Gastroenterology;;   TEE WITHOUT CARDIOVERSION N/A 08/05/2019    Procedure: TRANSESOPHAGEAL ECHOCARDIOGRAM (TEE);  Surgeon: [name] Deloris Ping, MD;  Location: [facility] ENDOSCOPY;  Service: Cardiovascular;  Laterality: N/A;   TONSILLECTOMY   1978             Family History  Problem Relation Age of Onset   Diabetes Mother     Hypertension Mother     Hyperlipidemia Mother     Kidney disease Mother          CKD stage 4   Pancreatic cancer Father          pancreatic   Hypertension Maternal Grandmother     Diabetes Maternal Grandmother     Heart failure Maternal Grandmother  CHF   Heart attack Maternal Grandfather     Hypertension Maternal Grandfather     Hypertension Paternal Grandmother     Alzheimer's disease Paternal Grandmother     Hypertension Paternal Grandfather     Cancer Maternal Uncle          melanoma   Cancer Paternal Uncle          kidney   Colon cancer Neg Hx     Esophageal cancer Neg Hx     Stomach cancer Neg Hx          Social History:  reports that she quit smoking about 11 years ago. Her smoking use included cigarettes. She started smoking about 16 years ago. She has a 1.3 pack-year smoking history. She has never used smokeless tobacco. She reports that she does not currently use alcohol. She reports that she does not currently use drugs after having used the following drugs: Marijuana and Cocaine. Allergies:  Allergies       Allergies  Allergen Reactions   Gabapentin Anaphylaxis, Nausea And Vomiting and Other (See Comments)      "Cannot walk"   Topiramate Other (See Comments)      Jaw tightness and chest discomfort    Albuterol Other (See Comments)      CAN tolerate levalbuterol   Hydroxyzine Other (See Comments)      QT prolongation related   Imitrex [Sumatriptan] Nausea And Vomiting and Other (See Comments)      Increased heart rate   Latex  Itching   Montelukast Other (See Comments)      Nightmares    Other Itching and Other (See Comments)      Plastic and artificial Christmas trees = Itching   Prozac [Fluoxetine] Other (See Comments)      Bad, vivid dreams   Trazodone And Nefazodone Other (See Comments)      Bad dreams 01/27/20 Pt is unsure if allergic to Nefazodone   Advair Diskus [Fluticasone-Salmeterol] Palpitations   Copper-Containing Compounds Rash and Other (See Comments)      Makes the face burn, also            Medications Prior to Admission  Medication Sig Dispense Refill   acetaminophen (TYLENOL) 500 MG tablet Take 1,000 mg by mouth See admin instructions. Take 1,000 mg by mouth in the morning and at bedtime and an additional 1,000 mg once a day as needed for pain       apixaban (ELIQUIS) 5 MG TABS tablet Take 1 tablet (5 mg total) by mouth 2 (two) times daily. 60 tablet 0   bumetanide (BUMEX) 1 MG tablet Take 1 tablet (1 mg total) by mouth daily. (Patient taking differently: Take 1-2 mg by mouth See admin instructions. Take 2 tablets by mouth in the morning and 1 tablet at night) 30 tablet 0   DULERA 200-5 MCG/ACT AERO Inhale 2 puffs into the lungs in the morning and at bedtime.       Dupilumab (DUPIXENT) 300 MG/2ML SOPN Inject 300 mg into the skin every 14 (fourteen) days.       iron polysaccharides (NIFEREX) 150 MG capsule Take 1 capsule by mouth daily.       Lifitegrast (XIIDRA) 5 % SOLN Place 1 drop into both eyes in the morning and at bedtime.       magnesium oxide (MAG-OX) 400 MG tablet Take 400 mg by mouth daily.       Melatonin 10 MG TABS Take 10 mg by mouth  at bedtime.       Multiple Vitamins-Minerals (BARIATRIC MULTIVITAMINS/IRON PO) Take 1 tablet by mouth daily with breakfast.       nystatin (MYCOSTATIN/NYSTOP) powder Apply topically 2 (two) times daily. (Patient taking differently: Apply 1 Application topically daily.) 15 g 0   pantoprazole (PROTONIX) 40 MG tablet Take 1 tablet (40 mg total) by mouth  2 (two) times daily. 60 tablet 0   potassium chloride SA (KLOR-CON M) 20 MEQ tablet Take 1 tablet (20 mEq total) by mouth daily for 15 days. (Patient taking differently: Take 40 mEq by mouth daily.) 15 tablet 0   pregabalin (LYRICA) 100 MG capsule Take 1 capsule (100 mg total) by mouth 2 (two) times daily. (Patient taking differently: Take 200 mg by mouth 2 (two) times daily.) 60 capsule 0   propranolol (INDERAL) 80 MG tablet Take 1 tablet by mouth 3 (three) times daily.       scopolamine (TRANSDERM-SCOP) 1 MG/3DAYS Place 1 patch (1.5 mg total) onto the skin every 3 (three) days. (Patient taking differently: Place 1 patch onto the skin every three (3) days as needed (for nausea).) 10 patch 0   silver sulfADIAZINE (SILVADENE) 1 % cream Apply 1 Application topically daily.       spironolactone (ALDACTONE) 25 MG tablet Take 1 tablet (25 mg total) by mouth daily. (Patient taking differently: Take 25 mg by mouth in the morning.) 30 tablet 0   VENTOLIN HFA 108 (90 Base) MCG/ACT inhaler 2 puffs every 6 (six) hours as needed for shortness of breath or wheezing.       VOLTAREN ARTHRITIS PAIN 1 % GEL Apply 2 g topically See admin instructions. Apply 2 grams to affected areas in the morning and at bedtime       ZYRTEC ALLERGY 10 MG tablet Take 10 mg by mouth daily as needed (for seasonal allergies).       fluticasone furoate-vilanterol (BREO ELLIPTA) 200-25 MCG/ACT AEPB Inhale 1 puff into the lungs daily. (Patient not taking: Reported on 04/24/2023) 60 each 0   Semaglutide,0.25 or 0.5MG /DOS, (OZEMPIC, 0.25 OR 0.5 MG/DOSE,) 2 MG/3ML SOPN Inject 0.5 mg into the skin every Wednesday. (Patient not taking: Reported on 07/23/2022)                  Home: Home Living Family/patient expects to be discharged to:: Private residence Living Arrangements:  (Mom) Available Help at Discharge: Family, Available 24 hours/day (Mom) Type of Home: House Home Access: Stairs to enter [employer] of Steps:  5 Entrance Stairs-Rails: Can reach both Home Layout: One level, Other (Comment) Bathroom Shower/Tub: Engineer, manufacturing systems: Handicapped height Bathroom Accessibility: Yes Home Equipment: Information systems manager, Agricultural consultant (2 wheels), Cane - single point, BSC/3in1, Hospital bed Additional Comments: Pt sleeps in hospital bed which is currently broken, says she needs a new one. Lives with mother who is able to do light housework.  Lives With: Family (Mom)   Functional History: Prior Function Prior Level of Function : Independent/Modified Independent Mobility Comments: occasionally uses cane, mother assists RLE into bed sometimes ADLs Comments: Mother helps get clothes out of closet, independent with dressing/bathing.   Functional Status:  Mobility: Bed Mobility Overal bed mobility: Needs Assistance Bed Mobility: Supine to Sit, Sit to Supine Supine to sit: Contact guard Sit to supine: Contact guard assist General bed mobility comments: CGA, increased time, use of bed rails and HOB elevated Transfers Overall transfer level: Needs assistance Equipment used: Rolling walker (2 wheels) Transfers: Sit to/from Stand Sit  to Stand: Contact guard assist, Min assist General transfer comment: CGA for safety and MIN A for balance in standing and to adjust feet for improved balance due to wide BOS. Verbal cues for safe hand placement with standing to prevent pulling up from RW. Ambulation/Gait Ambulation/Gait assistance: Contact guard assist Gait Distance (Feet): 10 Feet Assistive device: Rolling walker (2 wheels) Gait Pattern/deviations: Step-through pattern, Decreased stride length, Trunk flexed, Wide base of support General Gait Details: short partial step through gait pattern with flexed trunk, increased BOS and standing rest break to brush teeth between ambulation. Pt was limited due to pt requires a bariatric RW Gait velocity: decreased Gait velocity interpretation: <1.31 ft/sec,  indicative of household ambulator   ADL: ADL Overall ADL's : Needs assistance/impaired Eating/Feeding: Independent Grooming: Set up, Sitting Upper Body Bathing: Minimal assistance, With adaptive equipment, Sitting Lower Body Bathing: Moderate assistance, With adaptive equipment, Sitting/lateral leans Upper Body Dressing : Minimal assistance, Sitting Lower Body Dressing: Maximal assistance, Sit to/from stand Toilet Transfer: Contact guard assist, +2 for safety/equipment, Stand-pivot, BSC/3in1, Rolling walker (2 wheels) Toileting- Clothing Manipulation and Hygiene: Moderate assistance, Sitting/lateral lean General ADL Comments: Pt states due to edema LB ADLs have been more difficult, PLOF independent. Pt has good overall strength, able to stand at bedside CGA,   Cognition: Cognition Orientation Level: Oriented X4 Cognition Arousal: Alert Behavior During Therapy: WFL for tasks assessed/performed   Physical Exam: Blood pressure (!) 110/53, pulse 86, temperature 98.3 F (36.8 C), temperature source Oral, resp. rate 14, height 5\' 5"  (1.651 m), weight (!) 171.1 kg, last menstrual period 12/20/2017, SpO2 92%. Physical Exam Vitals and nursing note reviewed.  Constitutional:      Comments: Pt sitting up in bedside chair- her BMI is >60; she is on 2L O2 by Daphne and appears slightly SOB with talking and exam, NAD  HENT:     Head: Normocephalic and atraumatic.     Right Ear: External ear normal.     Left Ear: External ear normal.     Nose: Nose normal. No congestion.     Mouth/Throat:     Mouth: Mucous membranes are dry.     Pharynx: Oropharynx is clear. No oropharyngeal exudate.  Eyes:     General:        Right eye: No discharge.        Left eye: No discharge.     Extraocular Movements: Extraocular movements intact.  Cardiovascular:     Rate and Rhythm: Normal rate and regular rhythm.     Heart sounds: Normal heart sounds. No murmur heard.    No gallop.  Pulmonary:     Comments:  Sounds hypoactive Breathing sounds- hard to hear throughout but doesn't appear to be worse in any lung field Abdominal:     Comments: Soft, NT, ND, protuberant; hypoactive- very hypoactive  Musculoskeletal:     Cervical back: Neck supple.     Comments: Ue's 5/5 throughout LE's 5-/5 throughout- B/L  Skin:    General: Skin is warm.     Comments: 2+ LE edema B/L Some eczema- not bad currently Heels dried/cracked   Neurological:     Mental Status: She is oriented to person, place, and time.     Comments: Patient is alert.  Sitting up in bed.  Oriented x 3.  Psychiatric:     Comments: Slightly anxious affect        Lab Results Last 48 Hours        Results for orders placed or  performed during the hospital encounter of 04/23/23 (from the past 48 hours)  Glucose, capillary     Status: Abnormal    Collection Time: 04/28/23  6:16 AM  Result Value Ref Range    Glucose-Capillary 139 (H) 70 - 99 mg/dL      Comment: Glucose reference range applies only to samples taken after fasting for at least 8 hours.    Comment 1 Document in Chart    Glucose, capillary     Status: Abnormal    Collection Time: 04/28/23 12:13 PM  Result Value Ref Range    Glucose-Capillary 153 (H) 70 - 99 mg/dL      Comment: Glucose reference range applies only to samples taken after fasting for at least 8 hours.  Glucose, capillary     Status: Abnormal    Collection Time: 04/28/23  4:07 PM  Result Value Ref Range    Glucose-Capillary 170 (H) 70 - 99 mg/dL      Comment: Glucose reference range applies only to samples taken after fasting for at least 8 hours.  Glucose, capillary     Status: Abnormal    Collection Time: 04/28/23  9:32 PM  Result Value Ref Range    Glucose-Capillary 133 (H) 70 - 99 mg/dL      Comment: Glucose reference range applies only to samples taken after fasting for at least 8 hours.    Comment 1 Notify RN      Comment 2 Document in Chart    Magnesium     Status: None    Collection Time:  04/29/23  2:25 AM  Result Value Ref Range    Magnesium 1.9 1.7 - 2.4 mg/dL      Comment: Performed at [facility], [address], [place], [place] [address]  CBC with Differential/Platelet     Status: Abnormal    Collection Time: 04/29/23  2:25 AM  Result Value Ref Range    WBC 7.3 4.0 - 10.5 K/uL    RBC 3.33 (L) 3.87 - 5.11 MIL/uL    Hemoglobin 8.9 (L) 12.0 - 15.0 g/dL    HCT 60.4 (L) 54.0 - 46.0 %    MCV 88.0 80.0 - 100.0 fL    MCH 26.7 26.0 - 34.0 pg    MCHC 30.4 30.0 - 36.0 g/dL    RDW 98.1 (H) 19.1 - 15.5 %    Platelets 140 (L) 150 - 400 K/uL    nRBC 0.0 0.0 - 0.2 %    Neutrophils Relative % 68 %    Neutro Abs 5.0 1.7 - 7.7 K/uL    Lymphocytes Relative 17 %    Lymphs Abs 1.2 0.7 - 4.0 K/uL    Monocytes Relative 10 %    Monocytes Absolute 0.8 0.1 - 1.0 K/uL    Eosinophils Relative 3 %    Eosinophils Absolute 0.2 0.0 - 0.5 K/uL    Basophils Relative 1 %    Basophils Absolute 0.1 0.0 - 0.1 K/uL    Immature Granulocytes 1 %    Abs Immature Granulocytes 0.09 (H) 0.00 - 0.07 K/uL      Comment: Performed at [facility], [address] [place], [place] [address]  Renal function panel     Status: Abnormal    Collection Time: 04/29/23  2:25 AM  Result Value Ref Range    Sodium 134 (L) 135 - 145 mmol/L    Potassium 3.4 (L) 3.5 - 5.1 mmol/L    Chloride 93 (L)  98 - 111 mmol/L    CO2 32 22 - 32 mmol/L    Glucose, Bld 139 (H) 70 - 99 mg/dL      Comment: Glucose reference range applies only to samples taken after fasting for at least 8 hours.    BUN 7 6 - 20 mg/dL    Creatinine, Ser 0.96 (H) 0.44 - 1.00 mg/dL    Calcium 8.2 (L) 8.9 - 10.3 mg/dL    Phosphorus 3.2 2.5 - 4.6 mg/dL    Albumin 2.6 (L) 3.5 - 5.0 g/dL    GFR, Estimated 57 (L) >60 mL/min      Comment: (NOTE) Calculated using the CKD-EPI Creatinine Equation (2021)      Anion gap 9 5 - 15      Comment: Performed at [facility], [address] [place], [place] [address]  Glucose, capillary      Status: Abnormal    Collection Time: 04/29/23  6:31 AM  Result Value Ref Range    Glucose-Capillary 124 (H) 70 - 99 mg/dL      Comment: Glucose reference range applies only to samples taken after fasting for at least 8 hours.    Comment 1 Notify RN      Comment 2 Document in Chart    Glucose, capillary     Status: Abnormal    Collection Time: 04/29/23 11:19 AM  Result Value Ref Range    Glucose-Capillary 149 (H) 70 - 99 mg/dL      Comment: Glucose reference range applies only to samples taken after fasting for at least 8 hours.  Glucose, capillary     Status: Abnormal    Collection Time: 04/29/23  4:13 PM  Result Value Ref Range    Glucose-Capillary 184 (H) 70 - 99 mg/dL      Comment: Glucose reference range applies only to samples taken after fasting for at least 8 hours.  Glucose, capillary     Status: Abnormal    Collection Time: 04/29/23  9:47 PM  Result Value Ref Range    Glucose-Capillary 211 (H) 70 - 99 mg/dL      Comment: Glucose reference range applies only to samples taken after fasting for at least 8 hours.    Comment 1 Notify RN      Comment 2 Document in Chart    Basic metabolic panel     Status: Abnormal    Collection Time: 04/30/23  2:19 AM  Result Value Ref Range    Sodium 136 135 - 145 mmol/L    Potassium 3.8 3.5 - 5.1 mmol/L    Chloride 92 (L) 98 - 111 mmol/L    CO2 32 22 - 32 mmol/L    Glucose, Bld 142 (H) 70 - 99 mg/dL      Comment: Glucose reference range applies only to samples taken after fasting for at least 8 hours.    BUN 9 6 - 20 mg/dL    Creatinine, Ser 9.81 0.44 - 1.00 mg/dL    Calcium 8.4 (L) 8.9 - 10.3 mg/dL    GFR, Estimated >19 >14 mL/min      Comment: (NOTE) Calculated using the CKD-EPI Creatinine Equation (2021)      Anion gap 12 5 - 15      Comment: Performed at [facility], [address] [place], [place] [address]  Magnesium     Status: None    Collection Time: 04/30/23  2:19 AM  Result Value Ref Range  Magnesium 1.7 1.7 -  2.4 mg/dL      Comment: Performed at [facility], [address] [place], [place] [address]       Imaging Results (Last 48 hours)  EP STUDY Result Date: 04/28/2023 See surgical note for result.          Blood pressure (!) 110/53, pulse 86, temperature 98.3 F (36.8 C), temperature source Oral, resp. rate 14, height 5\' 5"  (1.651 m), weight (!) 171.1 kg, last menstrual period 12/20/2017, SpO2 92%.   Medical Problem List and Plan: 1. Functional deficits secondary to debility related to acute on chronic diastolic congestive heart failure with exacerbation- diuresed significant weight             -patient may  shower             -ELOS/Goals: 5-7 days mod I- doesn't have help at home 2.  Antithrombotics: -DVT/anticoagulation:  Pharmaceutical: Eliquis             -antiplatelet therapy: N/A 3. Pain Management: Voltaren gel twice daily, Lyrica 200 mg twice daily, hydrocodone as needed 4. Mood/Behavior/Sleep: Melatonin 10 mg nightly             -antipsychotic agents: N/A 5. Neuropsych/cognition: This patient is capable of making decisions on her own behalf. 6. Skin/Wound Care: Routine skin checks 7. Fluids/Electrolytes/Nutrition: Routine in and outs with follow-up chemistries 8.  PAF/A-fib with RVR.  Cardioversion 04/28/2023.  Cardiac rate controlled.  Toprol-XL 75 mg daily 9.  E. coli UTI.  Completed course of Rocephin 10.  Right thigh cellulitis.  Completed course of Rocephin 11.  Gastroenteritis secondary to norovirus/diarrhea.  Improving.  Supportive care..  Enteric precautions- no Sx's for 4 days, can stop if OK with Infection prevention 12.  Asthma.  Continue inhalers as directed check oxygen saturations every shift 13.  OSA.  CPAP 14.  Acute on chronic CHF exacerbation.  Monitor for any signs of fluid overload.  Continue Bumex 1 mg twice daily, Aldactone 25 mg daily 15.  Acute on chronic anemia.  Niferex daily.  Check CBC 16.  Diabetes mellitus.  Latest hemoglobin A1c 6.7.   Presently on SSI.  Patient on Ozempic prior to admission. 17.  Obesity.  BMI 62.77.  Dietary follow-up 18. Eczema- usually takes Dupixant- will see if can restart. Pt can bring in from home 19. Constipation- LBM 4 days ago- wants to wait on Sorbitol 20. Preventative- will do vaseline for heels and change to regular bed per pt- see if can get Zyrtec from home to take since don't have here.    Mcarthur Rossetti Angiulli, PA-C     I have personally performed a face to face diagnostic evaluation of this patient and formulated the key components of the plan.  Additionally, I have personally reviewed laboratory data, imaging studies, as well as relevant notes and concur with the physician assistant's documentation above.   The patient's status has not changed from the original H&P.  Any changes in documentation from the acute care chart have been noted above.     04/30/2023

## 2023-04-30 NOTE — Progress Notes (Signed)
 Inpatient Rehabilitation Admissions Coordinator   I have insurance approval and CIR bed to admit her today. Acute team and TOC made aware. Patient in agreement. I will make the arrangements  Ottie Glazier, RN, MSN Rehab Admissions Coordinator [phone] 04/30/2023 9:54 AM

## 2023-04-30 NOTE — Plan of Care (Signed)
  Problem: Metabolic: Goal: Ability to maintain appropriate glucose levels will improve Outcome: Progressing   Problem: Tissue Perfusion: Goal: Adequacy of tissue perfusion will improve Outcome: Progressing   Problem: Clinical Measurements: Goal: Diagnostic test results will improve Outcome: Progressing Goal: Respiratory complications will improve Outcome: Progressing Goal: Cardiovascular complication will be avoided Outcome: Progressing   Problem: Activity: Goal: Risk for activity intolerance will decrease Outcome: Progressing   Problem: Coping: Goal: Level of anxiety will decrease Outcome: Progressing   Problem: Pain Managment: Goal: General experience of comfort will improve and/or be controlled Outcome: Progressing   Problem: Safety: Goal: Ability to remain free from injury will improve Outcome: Progressing   Problem: Activity: Goal: Capacity to carry out activities will improve Outcome: Progressing   Problem: Cardiac: Goal: Ability to achieve and maintain adequate cardiopulmonary perfusion will improve Outcome: Progressing

## 2023-04-30 NOTE — Progress Notes (Signed)
 Physical Therapy Treatment Patient Details Name: Valentine Kuechle MRN: [id number] DOB: 1971/01/14 Today's Date: 04/30/2023   History of Present Illness 53 year old, morbidly obese female presented to the ED and admitted for acute on chronic HFpEF, also noted to have a norovirus gastroenteritis as well as a lower extremity cellulitis, with history of chronic HFpEF, PAF on Eliquis, asthma, type 2 diabetes, hypertension, depression/anxiety    PT Comments  Pt is slowly progressing. Able to progress gait this session. Pt was limited due to she needed to void and has urgency. Pt continues at Min A to CGA for all functional mobility. Pt has supportive parent at home to assist. Due to pt current functional status, home set up and available assistance at home recommending skilled physical therapy services > 3 hours/day in order to address strength, balance and functional mobility to decrease risk for falls, injury, immobility, skin break down and re-hospitalization.      If plan is discharge home, recommend the following: A little help with walking and/or transfers;Assistance with cooking/housework;Assist for transportation;Help with stairs or ramp for entrance     Equipment Recommendations  Hospital bed       Precautions / Restrictions Precautions Precautions: Fall Recall of Precautions/Restrictions: Intact Restrictions Weight Bearing Restrictions Per Provider Order: No     Mobility  Bed Mobility Overal bed mobility: Needs Assistance Bed Mobility: Supine to Sit     Supine to sit: Contact guard     General bed mobility comments: CGA, increased time, use of bed rails and HOB elevated    Transfers Overall transfer level: Needs assistance Equipment used: Rolling walker (2 wheels) Transfers: Sit to/from Stand Sit to Stand: Contact guard assist, Min assist           General transfer comment: CGA for safety and MIN A for balance in standing and to adjust feet for improved balance due  to wide BOS. Good hand placement    Ambulation/Gait Ambulation/Gait assistance: Contact guard assist Gait Distance (Feet): 15 Feet Assistive device: Rolling walker (2 wheels) Gait Pattern/deviations: Step-through pattern, Decreased stride length, Trunk flexed, Wide base of support Gait velocity: decreased Gait velocity interpretation: <1.31 ft/sec, indicative of household ambulator   General Gait Details: short partial step through gait pattern with flexed trunk, increased BOS, limited gait due to pt needed to void        Balance Overall balance assessment: Needs assistance Sitting-balance support: No upper extremity supported, Feet supported Sitting balance-Leahy Scale: Fair     Standing balance support: Single extremity supported, During functional activity, Bilateral upper extremity supported Standing balance-Leahy Scale: Fair Standing balance comment: no overt LOB      Communication Communication Communication: No apparent difficulties  Cognition Arousal: Alert Behavior During Therapy: WFL for tasks assessed/performed   PT - Cognitive impairments: No apparent impairments   Following commands: Intact            General Comments General comments (skin integrity, edema, etc.): Pt presents with softening lobules, skin crepeing in the bil LE and R abdomen this session. HR remained in the 80's throughout session.      Pertinent Vitals/Pain Pain Assessment Pain Assessment: 0-10 Pain Score: 5  Pain Location: R heel Pain Descriptors / Indicators: Aching, Discomfort Pain Intervention(s): Monitored during session, Limited activity within patient's tolerance     PT Goals (current goals can now be found in the care plan section) Acute Rehab PT Goals Patient Stated Goal: To get stronger and move better then return home. PT Goal Formulation: With  patient Time For Goal Achievement: 05/13/23 Potential to Achieve Goals: Good Progress towards PT goals: Progressing toward  goals    Frequency    Min 1X/week      PT Plan  Continue with current POC        AM-PAC PT "6 Clicks" Mobility   Outcome Measure  Help needed turning from your back to your side while in a flat bed without using bedrails?: A Little Help needed moving from lying on your back to sitting on the side of a flat bed without using bedrails?: A Little Help needed moving to and from a bed to a chair (including a wheelchair)?: A Little Help needed standing up from a chair using your arms (e.g., wheelchair or bedside chair)?: A Little Help needed to walk in hospital room?: A Little Help needed climbing 3-5 steps with a railing? : A Lot 6 Click Score: 17    End of Session   Activity Tolerance: Patient tolerated treatment well Patient left: in chair;with call bell/phone within reach Nurse Communication: Mobility status PT Visit Diagnosis: Unsteadiness on feet (R26.81);Other abnormalities of gait and mobility (R26.89)     Time: [id number] PT Time Calculation (min) (ACUTE ONLY): 31 min  Charges:    $Therapeutic Activity: 23-37 mins PT General Charges $$ ACUTE PT VISIT: 1 Visit                    Harrel Carina, DPT, CLT  Acute Rehabilitation Services Office: [phone] (Secure chat preferred)    Claudia Desanctis 04/30/2023, 12:30 PM

## 2023-04-30 NOTE — Progress Notes (Signed)
   04/30/23 2300  BiPAP/CPAP/SIPAP  BiPAP/CPAP/SIPAP Pt Type Adult  Reason BIPAP/CPAP not in use Non-compliant;Other(comment) (pt adamantly refused)

## 2023-05-01 LAB — CBC WITH DIFFERENTIAL/PLATELET
Abs Immature Granulocytes: 0.1 K/uL — ABNORMAL HIGH (ref 0.00–0.07)
Basophils Absolute: 0.1 K/uL (ref 0.0–0.1)
Basophils Relative: 1 %
Eosinophils Absolute: 0.2 K/uL (ref 0.0–0.5)
Eosinophils Relative: 2 %
HCT: 33.2 % — ABNORMAL LOW (ref 36.0–46.0)
Hemoglobin: 10 g/dL — ABNORMAL LOW (ref 12.0–15.0)
Immature Granulocytes: 1 %
Lymphocytes Relative: 18 %
Lymphs Abs: 1.3 K/uL (ref 0.7–4.0)
MCH: 26.3 pg (ref 26.0–34.0)
MCHC: 30.1 g/dL (ref 30.0–36.0)
MCV: 87.4 fL (ref 80.0–100.0)
Monocytes Absolute: 0.6 K/uL (ref 0.1–1.0)
Monocytes Relative: 9 %
Neutro Abs: 5.1 K/uL (ref 1.7–7.7)
Neutrophils Relative %: 69 %
Platelets: 170 K/uL (ref 150–400)
RBC: 3.8 MIL/uL — ABNORMAL LOW (ref 3.87–5.11)
RDW: 17.8 % — ABNORMAL HIGH (ref 11.5–15.5)
WBC: 7.4 K/uL (ref 4.0–10.5)
nRBC: 0 % (ref 0.0–0.2)

## 2023-05-01 LAB — COMPREHENSIVE METABOLIC PANEL WITH GFR
ALT: 28 U/L (ref 0–44)
AST: 61 U/L — ABNORMAL HIGH (ref 15–41)
Albumin: 2.7 g/dL — ABNORMAL LOW (ref 3.5–5.0)
Alkaline Phosphatase: 75 U/L (ref 38–126)
Anion gap: 12 (ref 5–15)
BUN: 9 mg/dL (ref 6–20)
CO2: 31 mmol/L (ref 22–32)
Calcium: 8.7 mg/dL — ABNORMAL LOW (ref 8.9–10.3)
Chloride: 92 mmol/L — ABNORMAL LOW (ref 98–111)
Creatinine, Ser: 0.74 mg/dL (ref 0.44–1.00)
GFR, Estimated: >60 mL/min
Glucose, Bld: 132 mg/dL — ABNORMAL HIGH (ref 70–99)
Potassium: 3.8 mmol/L (ref 3.5–5.1)
Sodium: 135 mmol/L (ref 135–145)
Total Bilirubin: 0.8 mg/dL (ref 0.0–1.2)
Total Protein: 6.6 g/dL (ref 6.5–8.1)

## 2023-05-01 LAB — GLUCOSE, CAPILLARY
Glucose-Capillary: 174 mg/dL — ABNORMAL HIGH (ref 70–99)
Glucose-Capillary: 177 mg/dL — ABNORMAL HIGH (ref 70–99)
Glucose-Capillary: 153 mg/dL — ABNORMAL HIGH (ref 70–99)
Glucose-Capillary: 193 mg/dL — ABNORMAL HIGH (ref 70–99)
Glucose-Capillary: 201 mg/dL — ABNORMAL HIGH (ref 70–99)

## 2023-05-01 MED ORDER — LIDOCAINE 5 % EX PTCH
2.0000 | MEDICATED_PATCH | CUTANEOUS | Status: DC
  Administered 2023-05-01 – 2023-05-09 (×6): 2 via TRANSDERMAL
  Filled 2023-05-01 (×10): qty 2

## 2023-05-01 NOTE — Progress Notes (Signed)
 Inpatient Rehabilitation  Patient information reviewed and entered into eRehab system by [facility]. Karen Kays., CCC/SLP, PPS Coordinator.  Information including medical coding, functional ability and quality indicators will be reviewed and updated through discharge.

## 2023-05-01 NOTE — Discharge Summary (Signed)
 Physician Discharge Summary  Patient ID: Alyssa Blanchard MRN: [id number] DOB/AGE: June 25, 1970 53 y.o.  Admit date: 04/30/2023 Discharge date: 05/12/2023  Discharge Diagnoses:  Principal Problem:   Debility Active Problems:   Acute on chronic diastolic heart failure (HCC)   Type 2 diabetes mellitus with hyperglycemia, without long-term current use of insulin (HCC)   Acute on chronic systolic CHF (congestive heart failure) (HCC)   Acute on chronic congestive heart failure (HCC) DVT prophylaxis Mood stabilization PAF/A-fib with RVR E. coli UTI Right thigh cellulitis Gastroenteritis secondary to norovirus Asthma OSA Acute on chronic anemia Diabetes mellitus Obesity Constipation  Discharged Condition: Stable  Significant Diagnostic Studies: DG Abd 1 View Result Date: 05/10/2023 CLINICAL DATA:  Constipation. No bowel movement for several days. Midline abdominal pain. EXAM: ABDOMEN - 1 VIEW COMPARISON:  Lumbar spine radiographs 04/08/23. FINDINGS: Moderate stool is present at the rectum. Bowel gas pattern throughout the colon is otherwise within normal limits. No obstruction is present. Small bowel is unremarkable. No definite free air is present. IMPRESSION: 1. Moderate stool at the rectum. 2. No obstruction or other acute abnormality. Electronically Signed   By: Marin Roberts M.D.   On: 05/10/2023 12:12   DG Cervical Spine Complete Result Date: 05/03/2023 CLINICAL DATA:  Neck pain. EXAM: CERVICAL SPINE - COMPLETE 4+ VIEW COMPARISON:  None available FINDINGS: Visualization of C1 through C6 on lateral view. No sagittal spondylolisthesis. Atlantodens interval is intact. Visualized vertebral body heights are maintained. Mild-to-moderate C5-6 disc space narrowing and anterior greater than posterior endplate osteophytes. C5-6 and C6-7 facet joint sclerosis and hypertrophy degenerative changes. The lateral masses of C1 are symmetrically aligned with the dens on open mouth odontoid view. No  prevertebral soft tissue swelling is seen. IMPRESSION: 1. Mild-to-moderate C5-6 degenerative disc and endplate changes. 2. C5-6 and C6-7 facet joint degenerative changes. Electronically Signed   By: Neita Garnet M.D.   On: 05/03/2023 20:55   EP STUDY Result Date: 04/28/2023 See surgical note for result.  DG Chest Port 1 View Result Date: 04/26/2023 CLINICAL DATA:  Shortness of breath EXAM: PORTABLE CHEST 1 VIEW COMPARISON:  Film from the previous day. FINDINGS: Cardiac shadow is stable. Increasing vascular congestion is noted with mild edema consistent with congestive failure. No sizable effusion is seen. No bony abnormality is noted. IMPRESSION: Mild CHF. Electronically Signed   By: Alcide Clever M.D.   On: 04/26/2023 03:31   DG CHEST PORT 1 VIEW Result Date: 04/25/2023 CLINICAL DATA:  [id number] Fever [id number] EXAM: PORTABLE CHEST 1 VIEW COMPARISON:  April 23, 2023 FINDINGS: The cardiomediastinal silhouette is unchanged and enlarged in contour. No pleural effusion. No pneumothorax. Similar appearance of scattered bandlike opacities bilaterally. IMPRESSION: Similar appearance of scattered bandlike opacities bilaterally, most likely atelectasis. Electronically Signed   By: Meda Klinefelter M.D.   On: 04/25/2023 11:54   ECHOCARDIOGRAM COMPLETE Result Date: 04/24/2023    ECHOCARDIOGRAM REPORT   Patient Name:   Alyssa Blanchard Date of Exam: 04/24/2023 Medical Rec #:  [id number]         Height:       65.0 in Accession #:    [id number]        Weight:       442.9 lb Date of Birth:  [birth year]         BSA:          2.773 m Patient Age:    52 years          BP:  116/58 mmHg Patient Gender: F                 HR:           99 bpm. Exam Location:  Inpatient Procedure: 2D Echo (Both Spectral and Color Flow Doppler were utilized during            procedure). Indications:    congestive heart failure  History:        Patient has prior history of Echocardiogram examinations, most                 recent  08/05/2019. Arrythmias:Atrial Fibrillation; Risk                 Factors:Diabetes.  Sonographer:    Delcie Roch RDCS Referring Phys: [id number] Lynda Rainwater A ACHARYA IMPRESSIONS  1. Left ventricular ejection fraction, by estimation, is 70 to 75%. The left ventricle has hyperdynamic function. The left ventricle has no regional wall motion abnormalities. There is mild left ventricular hypertrophy. Left ventricular diastolic parameters were normal (medial e' 9.7 cm/s).  2. Right ventricular systolic function is normal. The right ventricular size is normal. There is mildly elevated pulmonary artery systolic pressure. The estimated right ventricular systolic pressure is 38.5 mmHg.  3. The mitral valve is grossly normal. Trivial mitral valve regurgitation. No evidence of mitral stenosis.  4. The aortic valve was not well visualized. There is mild calcification of the aortic valve. Aortic valve regurgitation is not visualized. No aortic stenosis is present.  5. The inferior vena cava is dilated in size with >50% respiratory variability, suggesting right atrial pressure of 8 mmHg.  6. Increased flow velocities may be secondary to anemia, thyrotoxicosis, hyperdynamic or high flow state. FINDINGS  Left Ventricle: Left ventricular ejection fraction, by estimation, is 70 to 75%. The left ventricle has hyperdynamic function. The left ventricle has no regional wall motion abnormalities. The left ventricular internal cavity size was normal in size. There is mild left ventricular hypertrophy. Left ventricular diastolic parameters were normal. Right Ventricle: The right ventricular size is normal. No increase in right ventricular wall thickness. Right ventricular systolic function is normal. There is mildly elevated pulmonary artery systolic pressure. The tricuspid regurgitant velocity is 2.76  m/s, and with an assumed right atrial pressure of 8 mmHg, the estimated right ventricular systolic pressure is 38.5 mmHg. Left Atrium: Left  atrial size was normal in size. Right Atrium: Right atrial size was normal in size. Pericardium: There is no evidence of pericardial effusion. Mitral Valve: The mitral valve is grossly normal. Trivial mitral valve regurgitation. No evidence of mitral valve stenosis. Tricuspid Valve: The tricuspid valve is normal in structure. Tricuspid valve regurgitation is trivial. No evidence of tricuspid stenosis. Aortic Valve: The aortic valve was not well visualized. There is mild calcification of the aortic valve. Aortic valve regurgitation is not visualized. No aortic stenosis is present. Aortic valve mean gradient measures 15.0 mmHg. Aortic valve peak gradient measures 27.4 mmHg. Aortic valve area, by VTI measures 2.19 cm. Pulmonic Valve: The pulmonic valve was not well visualized. Pulmonic valve regurgitation is trivial. No evidence of pulmonic stenosis. Aorta: The aortic root is normal in size and structure. Venous: The inferior vena cava is dilated in size with greater than 50% respiratory variability, suggesting right atrial pressure of 8 mmHg. IAS/Shunts: The interatrial septum was not well visualized.  LEFT VENTRICLE PLAX 2D LVIDd:         4.90 cm   Diastology LVIDs:  3.40 cm   LV e' medial:    9.68 cm/s LV PW:         1.20 cm   LV E/e' medial:  14.4 LV IVS:        1.10 cm   LV e' lateral:   15.00 cm/s LVOT diam:     1.80 cm   LV E/e' lateral: 9.3 LV SV:         91 LV SV Index:   33 LVOT Area:     2.54 cm  RIGHT VENTRICLE             IVC RV Basal diam:  2.80 cm     IVC diam: 2.20 cm RV S prime:     17.50 cm/s TAPSE (M-mode): 3.0 cm LEFT ATRIUM             Index        RIGHT ATRIUM           Index LA diam:        4.20 cm 1.51 cm/m   RA Area:     15.50 cm LA Vol (A2C):   42.7 ml 15.40 ml/m  RA Volume:   37.40 ml  13.49 ml/m LA Vol (A4C):   70.8 ml 25.53 ml/m LA Biplane Vol: 56.2 ml 20.26 ml/m  AORTIC VALVE AV Area (Vmax):    2.05 cm AV Area (Vmean):   2.12 cm AV Area (VTI):     2.19 cm AV Vmax:            261.50 cm/s AV Vmean:          181.000 cm/s AV VTI:            0.417 m AV Peak Grad:      27.4 mmHg AV Mean Grad:      15.0 mmHg LVOT Vmax:         211.00 cm/s LVOT Vmean:        150.500 cm/s LVOT VTI:          0.359 m LVOT/AV VTI ratio: 0.86  AORTA Ao Root diam: 3.00 cm Ao Asc diam:  3.70 cm MITRAL VALVE                TRICUSPID VALVE MV Area (PHT): 4.60 cm     TR Peak grad:   30.5 mmHg MV Decel Time: 165 msec     TR Vmax:        276.00 cm/s MV E velocity: 139.00 cm/s MV A velocity: 117.00 cm/s  SHUNTS MV E/A ratio:  1.19         Systemic VTI:  0.36 m                             Systemic Diam: 1.80 cm Weston Brass MD Electronically signed by Weston Brass MD Signature Date/Time: 04/24/2023/6:20:08 PM    Final    DG Chest Port 1 View Result Date: 04/23/2023 CLINICAL DATA:  Shortness of breath, fluid overload EXAM: PORTABLE CHEST 1 VIEW COMPARISON:  03/10/2023 FINDINGS: Mild enlargement of the cardiopericardial silhouette. Airway thickening is present, suggesting bronchitis or reactive airways disease. Stable bandlike densities in both mid lungs favoring scarring. No blunting of the costophrenic angles. Indistinct pulmonary vasculature, cannot exclude pulmonary venous hypertension. No overt edema. Lower thoracic spondylosis. IMPRESSION: 1. Mild enlargement of the cardiopericardial silhouette with indistinct pulmonary vasculature, cannot exclude pulmonary venous hypertension. No overt edema. 2. Airway thickening is  present, suggesting bronchitis or reactive airways disease. 3. Stable bandlike densities in both mid lungs favoring scarring. Electronically Signed   By: Gaylyn Rong M.D.   On: 04/23/2023 17:54    Labs:  Basic Metabolic Panel: Recent Labs  Lab 05/08/23 0658  NA 134*  K 3.4*  CL 98  CO2 26  GLUCOSE 159*  BUN 10  CREATININE 0.69  CALCIUM 8.9    CBC: Recent Labs  Lab 05/05/23 2107  WBC 7.9  HGB 10.4*  HCT 35.4*  MCV 87.0  PLT 201    CBG: Recent Labs  Lab  05/11/23 0559 05/11/23 1145 05/11/23 1803 05/11/23 2112 05/12/23 0622  GLUCAP 123* 137* 159* 152* 118*   Family history.  Mother with diabetes hypertension as well as hyperlipidemia and CKD stage IV.  Father with pancreatic cancer.  Denies any colon cancer esophageal cancer or rectal cancer  Brief HPI:   Alyssa Blanchard is a 53 y.o. right-handed female with CIR admission 02/28/2022 - 03/11/2022 for debility/morbid obesity with BMI 62.77, right lower extremity cellulitis as well as history of chronic diastolic congestive heart failure, anemia, PAF on Eliquis, asthma, type 2 diabetes mellitus, OSA, hypertension, depression/anxiety, quit smoking 11 years ago.  Recent admission to [facility] 03/11/2023 - 03/17/2023 for acute on chronic CHF exacerbation and diuresed aggressively.  Weight was 206 kg on admission at that time down to 179 kg on discharge.  Per chart review patient lives with her elderly mother.  Mother is able to do light housework and provide limited physical assistance.  Patient independent with a cane and driving prior to admission.  Presented 04/23/2023 with increasing shortness of breath with progressive fluid buildup lower extremities x 1 week.  Chest x-ray showed mild enlargement of the cardiopericardial silhouette with distinct pulmonary vascular could not exclude pulmonary venous hypertension.  No overt edema.  Admission chemistries unremarkable except hemoglobin 9.3 BNP 66.4 troponin negative blood cultures no growth to date.  Echocardiogram ejection fraction of 70 to 75% left ventricular hyperdynamic function.  No evidence of mitral stenosis.  Hospital course complicated by A-fib with RVR follow-up per cardiology services started on diltiazem and amiodarone for control.  Patient did require electrical cardioversion 04/28/2023 per Dr. [name].  Presently maintained on Eliquis.  She did spike a fever 04/25/2023 with nausea and emesis.  GI pathogen panel positive for norovirus.  Blood  cultures remain no growth to date urine culture greater 100,000 E. coli.  Intravenous Rocephin initiated and completed.  Right thigh cellulitis and again continue on Rocephin until completed.  Therapy evaluations completed due to patient decreased functional mobility was admitted for a comprehensive rehab program.   Hospital Course: Alyssa Blanchard was admitted to rehab 04/30/2023 for inpatient therapies to consist of PT, ST and OT at least three hours five days a week. Past admission physiatrist, therapy team and rehab RN have worked together to provide customized collaborative inpatient rehab.  Pertaining to patient's acute on chronic diastolic congestive heart failure with exacerbation diuresed significantly with follow-up per cardiology services monitoring for any signs of fluid overload.  She remained on chronic Eliquis for PAF A-fib with RVR cardiac rate controlled no chest pain or increasing shortness of breath.  Pain managed with use of Lyrica scheduled as well as hydrocodone as needed for breakthrough pain.  Hospital course completing course of Rocephin for E. coli UTI as well as coverage for right thigh cellulitis she remained afebrile.  Norovirus much improved supportive care enteric precautions no symptoms  during rehab stay appetite continued to improve.  Noted history of asthma OSA inhalers as advised monitoring oxygen saturations every shift.  Acute on chronic anemia iron supplement as directed no bleeding episodes.  Blood sugars overall controlled hemoglobin A1c of 6.7 patient on Ozempic prior to admission.  Obesity BMI 62.77 with dietary follow-up.  Bouts of constipation with KUB showing moderate stool at the rectum no obstruction or other acute abnormalities.  No vomiting noted patient did receive magnesium citrate with little results maintained on stool softeners.  Patient was educated on bowel program however she was refusing bowel program at times during her hospitalization.  Follow-up per GI  services 05/11/2023 for severe constipation as well as some rectal discomfort with rectal exam question anal fissure.  She was placed on diltiazem gel 2% small pea-sized amount per rectum 3 times a day   Blood pressures were monitored on TID basis and soft and monitored  Diabetes has been monitored with ac/hs CBG checks and SSI was use prn for tighter BS control.    Rehab course: During patient's stay in rehab weekly team conferences were held to monitor patient's progress, set goals and discuss barriers to discharge. At admission, patient required contact-guard 10 feet rolling walker contact-guard to minimal assist sit to stand  Physical exam.  Blood pressure 110/53 pulse 86 temperature 98.3 respirations 14 oxygen saturation 92% room air Constitutional.  No acute distress HEENT Head.  Normocephalic and atraumatic Eyes.  Pupils round and reactive to light no discharge without nystagmus Neck.  Supple nontender no JVD without thyromegaly Cardiac regular rate and rhythm without any extra sounds or murmur heard Abdomen.  Soft nontender positive bowel sounds without rebound Respiratory hypoactive breathing sounds hard to hear throughout but appears clear to auscultation Musculoskeletal.  Upper extremities 5/5 throughout lower extremities 5 -/5 throughout bilaterally Skin.  Warm and dry some eczema Neurologic.  Alert and oriented x 3  He/She  has had improvement in activity tolerance, balance, postural control as well as ability to compensate for deficits. He/She has had improvement in functional use RUE/LUE  and RLE/LLE as well as improvement in awareness.  Completes bed mobility with supervision.  Sit to stand without device supervision ambulates to the toilet supervision without device.  Ambulates 150 feet with rollator.  Working on [employer] Financial [employer].  She was able to gather her belongings for ADLs.  Contact-guard shower transfers.  Full family teaching completed plan discharge to  home.       Disposition:  Discharge disposition: 06-Home-Health Care Svc        Diet: Diabetic diet  Special Instructions: No driving smoking or alcohol  Medications at discharge. 1.  Tylenol as needed 2.  Eliquis 5 mg p.o. twice daily 3.  Bumex 1 mg p.o. twice daily 4.  Voltaren gel 2 g twice daily 5.  Hydrocodone 10-325 mg 1 to 2 tablets every 6 hours as needed pain 6.  Niferex 150 mg p.o. daily 7.  Magnesium oxide 400 mg p.o. daily 8.  Melatonin 10 mg p.o. nightly 9.  Toprol-XL 75 mg p.o. daily 10.  Dulera 1 puff daily 11.  Lyrica 200 mg p.o. twice daily 12.  Protonix 40 mg p.o. twice daily 13.  Aldactone 25 mg p.o. daily 14.  Zyrtec 10 mg daily 15.  Doxycycline 100 mg every 12 hours x 1 day and stop 16.  Lidoderm patch change as directed 17.  Senokot S1 tablet nightly as needed 18.  Dupixent 300 mg into the skin  every 14 days 19.  Multivitamin daily 20.Xiidra 5% solution 1 drop both eyes in the morning and bedtime 21.  Silvadene topical apply to right side daily 22.  Ventolin inhaler 2 puffs every 6 hours as needed shortness of breath 23.  Robaxin 500 mg every 6 hours as needed 24.  MiraLAX twice daily hold for loose stools 25.  Ativan 0.5 mg every 6 hours as needed anxiety 26.  Dulcolax tablet daily as needed 27.Colace 200 mg QHS 28.  Diltiazem gel 2% small pea-sized amount per rectum 3 times daily x 6 weeks   30-35 minutes were spent completing discharge summary and discharge planning  Discharge Instructions     Ambulatory referral to Behavioral Health   Complete by: As directed    Evaluate and treat anxiety/depression        Follow-up Information     Lovorn, Aundra Millet, MD Follow up.   Specialty: Physical Medicine and Rehabilitation Why: No formal follow-up needed Contact information: [address] [place] [address] [phone]         Randel Pigg, Dorma Russell, MD Follow up.   Specialty: Family Medicine Contact  information: [address] [place] [address] [phone]         Jenel Lucks, MD Follow up.   Specialty: Gastroenterology Why: call for appointment Contact information: [address] [place] [address] [phone]         Napoleon Form, MD Follow up.   Specialty: Gastroenterology Why: Call for appointment Contact information: [address] [place] [address] [phone]                 Signed: Charlton Amor 05/12/2023, 7:49 AM

## 2023-05-01 NOTE — Progress Notes (Signed)
 Met with patient to review current situation, team conferences and plan of care. Reviewed medications. Reviewed the importance of low sodium diet . Reviewed pain and any trouble with sleeping. Per patient she does not use  any oxygen at home, no cpap as well. Reviewed the importance of daily weight , daily recording and when to seek medical help. Continue to follow along to provide educational needs to facilitate preparation for discharge.

## 2023-05-01 NOTE — Progress Notes (Signed)
 Patient displayed verbally aggressive behavior toward staff, Nurse Tech (NT) at 2135 when her vital signs was being taken. BP cuff was put on her left upper arm by this writer, and BP machine was being operated by NT, in the process patient started yelling, "take it off, take it off", writer disconnected the tube and patient ripped the cuff off her arm and threw it across her bed. NT told patient that she didn't have to be hateful about that. Patient got upset and was tearful and continued to use profane language. Nurse proceeded with her medications, NT was out of the room. Writer attempted to de-escalate the situation by apologizing to the patient. She also apologized and stated that she was sleepy and wasn't aware that the BP cuff was on her upper arm and that it's usually taking on her lower arm with a smaller cuff. Patient requested to talk to the charge nurse. Notified charge nurse, Patient was asleep upon her arrival.

## 2023-05-01 NOTE — Progress Notes (Signed)
 Inpatient Rehabilitation Care Coordinator Assessment and Plan Patient Details  Name: Alyssa Blanchard MRN: [id number] Date of Birth: 1970-11-05  Today's Date: 05/01/2023  Hospital Problems: Principal Problem:   Debility Active Problems:   Acute on chronic diastolic heart failure [facility])  Past Medical History:  Past Medical History:  Diagnosis Date   Anxiety    Asthma    Atrial fibrillation (HCC)    Depression    DM (diabetes mellitus), type 2 (HCC) 02/16/2022   Dysrhythmia    new onset Afib rvr   GERD (gastroesophageal reflux disease)    Heart failure (HCC)    Heel spur    bilat   History of bronchitis    History of chicken pox    History of urinary tract infection    Hypertension    Migraines    Plantar fasciitis    bilat   STD (sexually transmitted disease)    chl hx & hsv 1&2   Tremors of nervous system    Urinary incontinence    Past Surgical History:  Past Surgical History:  Procedure Laterality Date   BIOPSY  10/14/2022   Procedure: BIOPSY;  Surgeon: Jenel Lucks, MD;  Location: [facility] ENDOSCOPY;  Service: Gastroenterology;;   CARDIOVERSION N/A 08/05/2019   Procedure: CARDIOVERSION;  Surgeon: Vesta Mixer, MD;  Location: [facility] ENDOSCOPY;  Service: Cardiovascular;  Laterality: N/A;   CARDIOVERSION N/A 04/28/2023   Procedure: CARDIOVERSION;  Surgeon: Jake Bathe, MD;  Location: [facility] INVASIVE CV LAB;  Service: Cardiovascular;  Laterality: N/A;   COLONOSCOPY WITH PROPOFOL N/A 10/14/2022   Procedure: COLONOSCOPY WITH PROPOFOL;  Surgeon: Jenel Lucks, MD;  Location: [facility] ENDOSCOPY;  Service: Gastroenterology;  Laterality: N/A;   ESOPHAGOGASTRODUODENOSCOPY (EGD) WITH PROPOFOL N/A 10/14/2022   Procedure: ESOPHAGOGASTRODUODENOSCOPY (EGD) WITH PROPOFOL;  Surgeon: Jenel Lucks, MD;  Location: [facility] ENDOSCOPY;  Service: Gastroenterology;  Laterality: N/A;   LAPAROSCOPIC GASTRIC SLEEVE RESECTION N/A 11/27/2014   Procedure: LAPAROSCOPIC GASTRIC SLEEVE RESECTION WITH  HIATAL HERNIA REPAIR UPPER ENDOSCOPY;  Surgeon: Luretha Murphy, MD;  Location: [facility] ORS;  Service: General;  Laterality: N/A;   POLYPECTOMY  10/14/2022   Procedure: POLYPECTOMY;  Surgeon: Jenel Lucks, MD;  Location: [facility] ENDOSCOPY;  Service: Gastroenterology;;   TEE WITHOUT CARDIOVERSION N/A 08/05/2019   Procedure: TRANSESOPHAGEAL ECHOCARDIOGRAM (TEE);  Surgeon: [name] Deloris Ping, MD;  Location: [facility] ENDOSCOPY;  Service: Cardiovascular;  Laterality: N/A;   TONSILLECTOMY  1978   Social History:  reports that she quit smoking about 11 years ago. Her smoking use included cigarettes. She started smoking about 16 years ago. She has a 1.3 pack-year smoking history. She has never used smokeless tobacco. She reports that she does not currently use alcohol. She reports that she does not currently use drugs after having used the following drugs: Marijuana and Cocaine.  Family / Support Systems Marital Status: Single Patient Roles: Other (Comment) (daughter) Other Supports: Ruth-mom [phone] Anticipated Caregiver: Mom Ability/Limitations of Caregiver: Mom has health issues of her own and she can only provide supervision pt was somewhat helping her prior to admission Caregiver Availability: 24/7 Family Dynamics: Close with Mom and have a neighbor who ubers them to appointments, since Mom does not drive anymore. WIll get transport numer for pt due to she has Medicaid  Social History Preferred language: English Religion: Christian Cultural Background: No issues Education: HS Health Literacy - How often do you need to have someone help you when you read instructions, pamphlets, or other written material from your doctor or pharmacy?:  Rarely Writes: Yes Employment Status: Disabled Marine scientist Issues: NA Guardian/Conservator: NA-according to MD pt is capable of making her own decisions while here   Abuse/Neglect Abuse/Neglect Assessment Can Be Completed: Yes Physical Abuse: Denies Verbal  Abuse: Denies Sexual Abuse: Denies Exploitation of patient/patient's resources: Denies Self-Neglect: Denies  Patient response to: Social Isolation - How often do you feel lonely or isolated from those around you?: Never  Emotional Status Pt's affect, behavior and adjustment status: Pt was doing ok until became ill and then one thing lead to another. She is hopeful she will get back to her mod/i levle and really has to due to mom can not assist her, due to mom's own health decline Recent Psychosocial Issues: other health issues Psychiatric History: no history but has been dealing with a lont and would benefit from seeing neuro-psych again she saw on last admit and thought it was helpful Substance Abuse History: NA  Patient / Family Perceptions, Expectations & Goals Pt/Family understanding of illness & functional limitations: Pt is able to explain her heart issues and BP issues, she feels it was one thing that turned into multiple things. She does talk with the MD's involved and feels she understands her plan moving forward. Premorbid pt/family roles/activities: daughter, retiree, neighbor, friend, etc Anticipated changes in roles/activities/participation: resume Pt/family expectations/goals: Pt states: " I need to be independent and go up four steps my Mom can not help me."  [employer]: Other (Comment) (Went to [place] [facility] in 2023) Premorbid Home Care/DME Agencies: Other (Comment) (has bariatric bedsdie commde and rolling walker) Transportation available at discharge: Mom not driving now so uses uber will get transport number for pt for medicaid transport Is the patient able to respond to transportation needs?: Yes In the past 12 months, has lack of transportation kept you from medical appointments or from getting medications?: No In the past 12 months, has lack of transportation kept you from meetings, work, or from getting things needed for daily living?:  No Resource referrals recommended: Neuropsychology  Discharge Planning Living Arrangements: Parent Support Systems: Parent, Friends/neighbors Type of Residence: Private residence Insurance Resources: Media planner (specify) ([employer] Medicaid) Financial Resources: Tree surgeon, Family Support Financial Screen Referred: No Living Expenses: Lives with family Money Management: Patient, Family Does the patient have any problems obtaining your medications?: No Home Management: both help one another Patient/Family Preliminary Plans: Return home with Mom whom she lives with. Both help one another and Mom's helath has declined since 2023 when pt was here then. Familar with rehab and the process. Will work on discharge needs. Care Coordinator Barriers to Discharge: Decreased caregiver support, Lack of/limited family support, Weight, Home environment access/layout Care Coordinator Anticipated Follow Up Needs: HH/OP  Clinical Impression Pt is familiar with CIR was here in 2023 and did well. She will need to be mod/I to be able to return home with Mom who can only be there. Will need to be able to do four steps into the home. Will work on transport and PCS referral so assist at home. Await therapists evaluations  Lucy Chris 05/01/2023, 10:27 AM

## 2023-05-01 NOTE — Progress Notes (Signed)
 PROGRESS NOTE   Subjective/Complaints:  Pt reports no BM now in 5 days- wants ot avoid Sorbitol as much as possible, so wants to wait til Saturday if doesn't go/get cleaned out, willing to try then, but not today.  Said "if has bariatric BSC" then can go Also I misunderstood yesterday about her wanting bariatric non air mattress- will check and see if can get for her.  Using purewick since doesn't have toilet  No nausea for 5 days now-   ROS:  Pt denies SOB, abd pain, CP, N/V/C/D, and vision changes   Objective:   No results found. Recent Labs    04/29/23 0225 05/01/23 0604  WBC 7.3 7.4  HGB 8.9* 10.0*  HCT 29.3* 33.2*  PLT 140* 170   Recent Labs    04/30/23 0219 05/01/23 0604  NA 136 135  K 3.8 3.8  CL 92* 92*  CO2 32 31  GLUCOSE 142* 132*  BUN 9 9  CREATININE 0.75 0.74  CALCIUM 8.4* 8.7*    Intake/Output Summary (Last 24 hours) at 05/01/2023 0803 Last data filed at 05/01/2023 0801 Gross per 24 hour  Intake 120 ml  Output 2700 ml  Net -2580 ml        Physical Exam: Vital Signs Blood pressure 138/73, pulse 72, temperature 98 F (36.7 C), temperature source Oral, resp. rate 16, weight (!) 178.8 kg, last menstrual period 12/20/2017, SpO2 93%.   General: awake, alert, appropriate, sitting up in bed; BMI 65.6 this AM (on regular bed- which was changed); NAD HENT: conjugate gaze; oropharynx dry- wearing 3L O2 CV: regular rate; and rhythm no JVD Pulmonary: CTA B/L; no W/R/R- - decreased sounds overall GI: a little firmer; NT; distended vs protuberant; hypoactive BS Psychiatric: appropriate- but stressed about interaction yesterday Neurological: Ox3 Musculoskeletal:     Cervical back: Neck supple.     Comments: Ue's 5/5 throughout LE's 5-/5 throughout- B/L  Skin:    General: Skin is warm.     Comments: no LE edema B/L- don't see any this AM Some eczema- not bad currently Heels dried/cracked     Assessment/Plan: 1. Functional deficits which require 3+ hours per day of interdisciplinary therapy in a comprehensive inpatient rehab setting. Physiatrist is providing close team supervision and 24 hour management of active medical problems listed below. Physiatrist and rehab team continue to assess barriers to discharge/monitor patient progress toward functional and medical goals  Care Tool:  Bathing              Bathing assist       Upper Body Dressing/Undressing Upper body dressing        Upper body assist      Lower Body Dressing/Undressing Lower body dressing            Lower body assist       Toileting Toileting    Toileting assist       Transfers Chair/bed transfer  Transfers assist           Locomotion Ambulation   Ambulation assist              Walk 10 feet activity   Assist  Walk 50 feet activity   Assist           Walk 150 feet activity   Assist           Walk 10 feet on uneven surface  activity   Assist           Wheelchair     Assist               Wheelchair 50 feet with 2 turns activity    Assist            Wheelchair 150 feet activity     Assist          Blood pressure 138/73, pulse 72, temperature 98 F (36.7 C), temperature source Oral, resp. rate 16, weight (!) 178.8 kg, last menstrual period 12/20/2017, SpO2 93%.  Medical Problem List and Plan: 1. Functional deficits secondary to debility related to acute on chronic diastolic congestive heart failure with exacerbation- diuresed significant weight             -patient may  shower             -ELOS/Goals: 5-7 days mod I- doesn't have help at home  First day of evaluations- con't CIR PT and OT 2.  Antithrombotics: -DVT/anticoagulation:  Pharmaceutical: Eliquis             -antiplatelet therapy: N/A 3. Pain Management: Voltaren gel twice daily, Lyrica 200 mg twice daily, hydrocodone as  needed  2/21- pain usually controlled- con't regimen 4. Mood/Behavior/Sleep: Melatonin 10 mg nightly             -antipsychotic agents: N/A 5. Neuropsych/cognition: This patient is capable of making decisions on her own behalf. 6. Skin/Wound Care: Routine skin checks 7. Fluids/Electrolytes/Nutrition: Routine in and outs with follow-up chemistries 8.  PAF/A-fib with RVR.  Cardioversion 04/28/2023.  Cardiac rate controlled.  Toprol-XL 75 mg daily  2/21- sounds in regular rhythm- con't regimen 9.  E. coli UTI.  Completed course of Rocephin 10.  Right thigh cellulitis.  Completed course of Rocephin 11.  Gastroenteritis secondary to norovirus/diarrhea.  Improving.  Supportive care..  Enteric precautions- no Sx's for 4 days, can stop if OK with Infection prevention  2/21- can stop since been 5 days since last Sx. Actually is constipated now 12.  Asthma.  Continue inhalers as directed check oxygen saturations every shift 13.  OSA.  CPAP 14.  Acute on chronic CHF exacerbation.  Monitor for any signs of fluid overload.  Continue Bumex 1 mg twice daily, Aldactone 25 mg daily  2/21- weight up 4 kg- however bed was changed- will monitor trend- no LE edema 15.  Acute on chronic anemia.  Niferex daily.  Check CBC 16.  Diabetes mellitus.  Latest hemoglobin A1c 6.7.  Presently on SSI.  Patient on Ozempic prior to admission.  2/21- BG's 161-096- only 1 value over 200- will see if family cane bring in Ozempic 17.  Obesity.  BMI 62.77.  Dietary follow-up  2/21- weight 178.8 kg- up a lot form yesterday but has different bed- will monitor trend 18. Eczema- usually takes Dupixant- will see if can restart. Pt can bring in from home 19. Constipation- LBM 4 days ago- wants to wait on Sorbitol  2/21- will give Sorbitol Saturday if no BM- so far it's been 5 days- no Bariatric BSC in room.  20. Preventative- will do vaseline for heels and change to regular bed per pt- see if can get Zyrtec from home to  take since don't  have here.     I spent a total of 36   minutes on total care today- >50% coordination of care- due to  d/w nursing x3 about bariatric BSC, bariatric non air bed and removing enteric precautions.     LOS: 1 days A FACE TO FACE EVALUATION WAS PERFORMED  Alyssa Blanchard 05/01/2023, 8:03 AM

## 2023-05-01 NOTE — Progress Notes (Signed)
 Inpatient Rehabilitation Center Individual Statement of Services  Patient Name:  Alyssa Blanchard  Date:  05/01/2023  Welcome to the Inpatient Rehabilitation Center.  Our goal is to provide you with an individualized program based on your diagnosis and situation, designed to meet your specific needs.  With this comprehensive rehabilitation program, you will be expected to participate in at least 3 hours of rehabilitation therapies Monday-Friday, with modified therapy programming on the weekends.  Your rehabilitation program will include the following services:  Physical Therapy (PT), Occupational Therapy (OT), 24 hour per day rehabilitation nursing, Therapeutic Recreaction (TR), Neuropsychology, Care Coordinator, Rehabilitation Medicine, Nutrition Services, and Pharmacy Services  Weekly team conferences will be held on Tuesday to discuss your progress.  Your Inpatient Rehabilitation Care Coordinator will talk with you frequently to get your input and to update you on team discussions.  Team conferences with you and your family in attendance may also be held.  Expected length of stay: 7 days  Overall anticipated outcome: mod/I level  Depending on your progress and recovery, your program may change. Your Inpatient Rehabilitation Care Coordinator will coordinate services and will keep you informed of any changes. Your Inpatient Rehabilitation Care Coordinator's name and contact numbers are listed  below.  The following services may also be recommended but are not provided by the Inpatient Rehabilitation Center:   Home Health Rehabiltiation Services Outpatient Rehabilitation Services    Arrangements will be made to provide these services after discharge if needed.  Arrangements include referral to agencies that provide these services.  Your insurance has been verified to be:  [employer] Your primary doctor is:  Kandyce Rud  Pertinent information will be shared with your doctor and your  insurance company.  Inpatient Rehabilitation Care Coordinator:  Dossie Der, [place] [phone] or Luna Glasgow  Information discussed with and copy given to patient by: Lucy Chris, 05/01/2023, 10:29 AM

## 2023-05-01 NOTE — Plan of Care (Signed)
  Problem: RH Balance Goal: LTG Patient will maintain dynamic standing with ADLs (OT) Description: LTG:  Patient will maintain dynamic standing balance with assist during activities of daily living (OT)  Flowsheets (Taken 05/01/2023 1249) LTG: Pt will maintain dynamic standing balance during ADLs with: Independent with assistive device   Problem: Sit to Stand Goal: LTG:  Patient will perform sit to stand in prep for activites of daily living with assistance level (OT) Description: LTG:  Patient will perform sit to stand in prep for activites of daily living with assistance level (OT) Flowsheets (Taken 05/01/2023 1249) LTG: PT will perform sit to stand in prep for activites of daily living with assistance level: Independent with assistive device   Problem: RH Bathing Goal: LTG Patient will bathe all body parts with assist levels (OT) Description: LTG: Patient will bathe all body parts with assist levels (OT) Flowsheets (Taken 05/01/2023 1249) LTG: Pt will perform bathing with assistance level/cueing: Independent with assistive device  LTG: Position pt will perform bathing: Shower   Problem: RH Dressing Goal: LTG Patient will perform upper body dressing (OT) Description: LTG Patient will perform upper body dressing with assist, with/without cues (OT). Flowsheets (Taken 05/01/2023 1249) LTG: Pt will perform upper body dressing with assistance level of: Independent Goal: LTG Patient will perform lower body dressing w/assist (OT) Description: LTG: Patient will perform lower body dressing with assist, with/without cues in positioning using equipment (OT) Flowsheets (Taken 05/01/2023 1249) LTG: Pt will perform lower body dressing with assistance level of: Independent with assistive device   Problem: RH Toileting Goal: LTG Patient will perform toileting task (3/3 steps) with assistance level (OT) Description: LTG: Patient will perform toileting task (3/3 steps) with assistance level (OT)   Flowsheets (Taken 05/01/2023 1249) LTG: Pt will perform toileting task (3/3 steps) with assistance level: Independent with assistive device   Problem: RH Simple Meal Prep Goal: LTG Patient will perform simple meal prep w/assist (OT) Description: LTG: Patient will perform simple meal prep with assistance, with/without cues (OT). Flowsheets (Taken 05/01/2023 1249) LTG: Pt will perform simple meal prep with assistance level of: Independent with assistive device LTG: Pt will perform simple meal prep w/level of: Wheelchair level   Problem: RH Toilet Transfers Goal: LTG Patient will perform toilet transfers w/assist (OT) Description: LTG: Patient will perform toilet transfers with assist, with/without cues using equipment (OT) Flowsheets (Taken 05/01/2023 1249) LTG: Pt will perform toilet transfers with assistance level of: Independent with assistive device   Problem: RH Tub/Shower Transfers Goal: LTG Patient will perform tub/shower transfers w/assist (OT) Description: LTG: Patient will perform tub/shower transfers with assist, with/without cues using equipment (OT) Flowsheets (Taken 05/01/2023 1249) LTG: Pt will perform tub/shower stall transfers with assistance level of: Independent with assistive device LTG: Pt will perform tub/shower transfers from: Walk in shower

## 2023-05-01 NOTE — Plan of Care (Signed)
  Problem: RH BOWEL ELIMINATION Goal: RH STG MANAGE BOWEL WITH ASSISTANCE Description: STG Manage Bowel with Assistance. Outcome: Progressing   Problem: RH BLADDER ELIMINATION Goal: RH STG MANAGE BLADDER WITH ASSISTANCE Description: STG Manage Bladder With supervision Assistance Outcome: Progressing   Problem: RH SKIN INTEGRITY Goal: RH STG SKIN FREE OF INFECTION/BREAKDOWN Description:  Manage skin integrity, free from infection/ breakdown with supervision assistance   Outcome: Progressing   Problem: RH SAFETY Goal: RH STG ADHERE TO SAFETY PRECAUTIONS W/ASSISTANCE/DEVICE Description: STG Adhere to Safety Precautions With supervision  Assistance/Device. Outcome: Progressing   Problem: RH PAIN MANAGEMENT Goal: RH STG PAIN MANAGED AT OR BELOW PT'S PAIN GOAL Description: <4w/ prns Outcome: Progressing   Problem: RH KNOWLEDGE DEFICIT GENERAL Goal: RH STG INCREASE KNOWLEDGE OF SELF CARE AFTER HOSPITALIZATION Outcome: Progressing

## 2023-05-01 NOTE — Discharge Instructions (Addendum)
 Inpatient Rehab Discharge Instructions  Sausha Raymond Lampkins Discharge date and time: No discharge date for patient encounter.   Activities/Precautions/ Functional Status: Activity: activity as tolerated Diet: diabetic diet Wound Care: Routine skin checks Functional status:  ___ No restrictions     ___ Walk up steps independently ___ 24/7 supervision/assistance   ___ Walk up steps with assistance ___ Intermittent supervision/assistance  ___ Bathe/dress independently ___ Walk with walker     _x__ Bathe/dress with assistance ___ Walk Independently    ___ Shower independently ___ Walk with assistance    ___ Shower with assistance ___ No alcohol     ___ Return to work/school ________  Special Instructions: No driving smoking or alcohol    COMMUNITY REFERRALS UPON DISCHARGE:    Home Health:   PT    &    OT                Agency: [facility]    Phone: [phone]   Medical Equipment/Items Ordered:REPLACING THE [facility] BED AT HOME-PATIENT TO PURCHASE HER OWN ROLLATOR SINCE GOT ROLLING WALKER ON 2023                                                 Agency/Supplier:[facility]   [phone]  TRANSPORTATION: [employer] [phone] CALL TO SCHEDULE TRANSPORT  PCS REFERRAL MADE TO [employer] [facility] LYNCH-[phone] TO FOLLOW UP WITH   My questions have been answered and I understand these instructions. I will adhere to these goals and the provided educational materials after my discharge from the hospital.  Patient/Caregiver Signature _______________________________ Date __________  Clinician Signature _______________________________________ Date __________  Please bring this form and your medication list with you to all your follow-up doctor's appointments.

## 2023-05-01 NOTE — Progress Notes (Signed)
 Inpatient Rehabilitation Admission Medication Review by a Pharmacist  A complete drug regimen review was completed for this patient to identify any potential clinically significant medication issues.  High Risk Drug Classes Is patient taking? Indication by Medication  Antipsychotic No   Anticoagulant Yes Apixaban- pAF  Antibiotic No   Opioid Yes Norco- acute pain  Antiplatelet No   Hypoglycemics/insulin Yes Insulin- T2DM  Vasoactive Medication Yes Bumex- HFpEF Toprol- HFpEF/HTN Aldactone- HFpEF  Chemotherapy No   Other Yes Zyrtec- allergies Melatonin- sleep Protonix- GERD Lyrica- neuropathy     Type of Medication Issue Identified Description of Issue Recommendation(s)  Drug Interaction(s) (clinically significant)     Duplicate Therapy     Allergy     No Medication Administration End Date     Incorrect Dose     Additional Drug Therapy Needed     Significant med changes from prior encounter (inform family/care partners about these prior to discharge).    Other  PTA meds: Xiidra Dupixent Restart PTA meds at time of discharge, if warranted    Clinically significant medication issues were identified that warrant physician communication and completion of prescribed/recommended actions by midnight of the next day:  No   Time spent performing this drug regimen review (minutes):  30   Alyssa Blanchard BS, PharmD, BCPS Clinical Pharmacist 05/01/2023 7:03 AM  Contact: [phone] after 3 PM  "Be curious, not judgmental..." -Debbora Dus

## 2023-05-01 NOTE — Evaluation (Signed)
 Occupational Therapy Assessment and Plan  Patient Details  Name: Alyssa Blanchard MRN: [id number] Date of Birth: 1970-04-27  OT Diagnosis: muscle weakness (generalized) and swelling of limb Rehab Potential: Rehab Potential (ACUTE ONLY): Good ELOS: 5-10 days   Today's Date: 05/01/2023 OT Individual Time: 1191-4782 OT Individual Time Calculation (min): 57 min     Hospital Problem: Principal Problem:   Debility Active Problems:   Acute on chronic diastolic heart failure (HCC)   Past Medical History:  Past Medical History:  Diagnosis Date   Anxiety    Asthma    Atrial fibrillation (HCC)    Depression    DM (diabetes mellitus), type 2 (HCC) 02/16/2022   Dysrhythmia    new onset Afib rvr   GERD (gastroesophageal reflux disease)    Heart failure (HCC)    Heel spur    bilat   History of bronchitis    History of chicken pox    History of urinary tract infection    Hypertension    Migraines    Plantar fasciitis    bilat   STD (sexually transmitted disease)    chl hx & hsv 1&2   Tremors of nervous system    Urinary incontinence    Past Surgical History:  Past Surgical History:  Procedure Laterality Date   BIOPSY  10/14/2022   Procedure: BIOPSY;  Surgeon: Jenel Lucks, MD;  Location: [facility];  Service: Gastroenterology;;   CARDIOVERSION N/A 08/05/2019   Procedure: CARDIOVERSION;  Surgeon: Vesta Mixer, MD;  Location[facility] ENDOSCOPY;  Service: Cardiovascular;  Laterality: N/A;   CARDIOVERSION N/A 04/28/2023   Procedure: CARDIOVERSION;  Surgeon: Jake Bathe, MD;  Location: [facility] INVASIVE CV LAB;  Service: Cardiovascular;  Laterality: N/A;   COLONOSCOPY WITH PROPOFOL N/A 10/14/2022   Procedure: COLONOSCOPY WITH PROPOFOL;  Surgeon: Jenel Lucks, MD;  Location: [facility] ENDOSCOPY;  Service: Gastroenterology;  Laterality: N/A;   ESOPHAGOGASTRODUODENOSCOPY (EGD) WITH PROPOFOL N/A 10/14/2022   Procedure: ESOPHAGOGASTRODUODENOSCOPY (EGD) WITH PROPOFOL;  Surgeon: Jenel Lucks, MD;  Location: [facility] ENDOSCOPY;  Service: Gastroenterology;  Laterality: N/A;   LAPAROSCOPIC GASTRIC SLEEVE RESECTION N/A 11/27/2014   Procedure: LAPAROSCOPIC GASTRIC SLEEVE RESECTION WITH HIATAL HERNIA REPAIR UPPER ENDOSCOPY;  Surgeon: Luretha Murphy, MD;  Location: [facility] ORS;  Service: General;  Laterality: N/A;   POLYPECTOMY  10/14/2022   Procedure: POLYPECTOMY;  Surgeon: Jenel Lucks, MD;  Location: [facility] ENDOSCOPY;  Service: Gastroenterology;;   TEE WITHOUT CARDIOVERSION N/A 08/05/2019   Procedure: TRANSESOPHAGEAL ECHOCARDIOGRAM (TEE);  Surgeon: [name] Deloris Ping, MD;  Location: [facility] ENDOSCOPY;  Service: Cardiovascular;  Laterality: N/A;   TONSILLECTOMY  1978    Assessment & Plan Clinical Impression: Patient is a 53 y.o. year old female with recent admission to the hospital on  04/23/2023 with increasing shortness of breath with progressive fluid buildup lower extremities x 1 week. Chest x-ray showed mild enlargement of the cardiopericardial silhouette with indistinct pulmonary vascular could not exclude pulmonary venous hypertension.   Patient transferred to CIR on 04/30/2023 .    Patient currently requires max with basic self-care skills for the lower body secondary to muscle weakness and decreased cardiorespiratoy endurance.  Prior to hospitalization, patient could complete BADL's and Modified IADL's with modified independent .  Patient will benefit from skilled intervention to increase independence with basic self-care skills and increase level of independence with iADL prior to discharge home with care partner.  Anticipate patient will require intermittent supervision and follow up home health.  OT - End of  Session Activity Tolerance: Tolerates 30+ min activity with multiple rests Endurance Deficit: Yes OT Assessment Rehab Potential (ACUTE ONLY): Good OT Barriers to Discharge: Decreased caregiver support;Weight OT Barriers to Discharge Comments: Patient reports mother is unable to  assist with IADL's at this time OT Patient demonstrates impairments in the following area(s): Balance;Endurance;Safety;Pain OT Basic ADL's Functional Problem(s): Bathing;Dressing;Toileting OT Advanced ADL's Functional Problem(s): Simple Meal Preparation;Laundry;Light Housekeeping OT Transfers Functional Problem(s): Toilet;Tub/Shower OT Additional Impairment(s): Fuctional Use of Upper Extremity OT Plan OT Intensity: Minimum of 1-2 x/day, 45 to 90 minutes OT Frequency: 5 out of 7 days OT Duration/Estimated Length of Stay: 5-10 days OT Treatment/Interventions: Balance/vestibular training;Community reintegration;Disease mangement/prevention;Patient/family education;Self Care/advanced ADL retraining;Therapeutic Exercise;UE/LE Coordination activities;UE/LE Strength taining/ROM;Therapeutic Activities;Pain management;Functional mobility training;DME/adaptive equipment instruction;Discharge planning OT Self Feeding Anticipated Outcome(s): Independent OT Basic Self-Care Anticipated Outcome(s): Mod Independent OT Toileting Anticipated Outcome(s): Mod Independent OT Bathroom Transfers Anticipated Outcome(s): Mod independnet OT Recommendation Recommendations for Other Services: Therapeutic Recreation consult Therapeutic Recreation Interventions: Stress management Patient destination: Home Follow Up Recommendations: Home health OT   OT Evaluation Precautions/Restrictions  Precautions Precautions: Fall Recall of Precautions/Restrictions: Intact Restrictions Weight Bearing Restrictions Per Provider Order: No General Chart Reviewed: Yes Family/Caregiver Present: No Vital Signs Therapy Vitals Temp: 98.8 F (37.1 C) Pulse Rate: 92 Resp: 18 BP: (!) 131/91 Patient Position (if appropriate): Sitting Oxygen Therapy SpO2: 93 % O2 Device: Room Air Pain Pain Assessment Pain Scale: 0-10 Pain Score: 4  Pain Type: Acute pain Pain Location: Shoulder Pain Orientation: Left Pain Descriptors /  Indicators: Aching Pain Onset: On-going Patients Stated Pain Goal: 0 Multiple Pain Sites: No Home Living/Prior Functioning Home Living Living Arrangements: Parent Available Help at Discharge: Family, Available 24 hours/day Type of Home: House Home Access: Stairs to enter Secretary/administrator of Steps: 5 Entrance Stairs-Rails: Can reach both Home Layout: One level, Other (Comment) Bathroom Shower/Tub: Engineer, manufacturing systems: Handicapped height Bathroom Accessibility: Yes Additional Comments: Pt sleeps in hospital bed in the dining room which is currently broken, says she needs a new one. Lives with mother who is able to do light housework, but has become more disabled recently due to multiple medical conditions.  Lives With: Family Prior Function Level of Independence: Requires assistive device for independence, Independent with gait, Independent with transfers  Able to Take Stairs?: Yes Driving: Yes Vocation: Unemployed Vision Baseline Vision/History: 1 Wears glasses Ability to See in Adequate Light: 0 Adequate Patient Visual Report: No change from baseline Additional Comments: No acute changes Perception  Perception: Within Functional Limits Praxis Praxis: WFL Cognition Cognition Overall Cognitive Status: Within Functional Limits for tasks assessed Arousal/Alertness: Awake/alert Orientation Level: Person;Place;Situation Person: Oriented Place: Oriented Situation: Oriented Memory: Appears intact Awareness: Appears intact Problem Solving: Appears intact Safety/Judgment: Appears intact Brief Interview for Mental Status (BIMS) Repetition of Three Words (First Attempt): 3 Temporal Orientation: Year: Correct Temporal Orientation: Month: Accurate within 5 days Temporal Orientation: Day: Correct Recall: "Sock": Yes, no cue required Recall: "Blue": Yes, no cue required Recall: "Bed": Yes, no cue required BIMS Summary Score: 15 Sensation Sensation Light Touch:  Appears Intact Hot/Cold: Appears Intact Proprioception: Appears Intact Stereognosis: Appears Intact Coordination Gross Motor Movements are Fluid and Coordinated: No Coordination and Movement Description: Patient with onset of L shoulder pain that limits smooth LUE reach. Motor  Motor Motor: Within Functional Limits  Trunk/Postural Assessment  Cervical Assessment Cervical Assessment: Within Functional Limits Postural Control Postural Control: Within Functional Limits  Balance   Extremity/Trunk Assessment RUE Assessment RUE Assessment: Within Functional Limits General  Strength Comments: 4/5 grossly LUE Assessment Active Range of Motion (AROM) Comments: limited shoulder flexion due to current muscle pain. General Strength Comments: strength functional for ADL performance  Care Tool Care Tool Self Care Eating   Eating Assist Level: Independent    Oral Care    Oral Care Assist Level: Set up assist    Bathing   Body parts bathed by patient: Right arm;Left arm;Chest;Abdomen;Front perineal area;Left upper leg;Right upper leg;Face Body parts bathed by helper: Right lower leg;Left lower leg;Buttocks   Assist Level: Moderate Assistance - Patient 50 - 74%    Upper Body Dressing(including orthotics)   What is the patient wearing?: Pull over shirt   Assist Level: Set up assist    Lower Body Dressing (excluding footwear)   What is the patient wearing?: Underwear/pull up;Pants Assist for lower body dressing: Maximal Assistance - Patient 25 - 49%    Putting on/Taking off footwear   What is the patient wearing?: Non-skid slipper socks Assist for footwear: Dependent - Patient 0%       Care Tool Toileting Toileting activity   Assist for toileting: Maximal Assistance - Patient 25 - 49%     Care Tool Bed Mobility Roll left and right activity   Roll left and right assist level: Supervision/Verbal cueing    Sit to lying activity   Sit to lying assist level: Contact  Guard/Touching assist    Lying to sitting on side of bed activity         Care Tool Transfers Sit to stand transfer   Sit to stand assist level: Contact Guard/Touching assist    Chair/bed transfer   Chair/bed transfer assist level: Contact Guard/Touching assist     Toilet transfer   Assist Level: Contact Guard/Touching assist     Care Tool Cognition  Expression of Ideas and Wants Expression of Ideas and Wants: 4. Without difficulty (complex and basic) - expresses complex messages without difficulty and with speech that is clear and easy to understand  Understanding Verbal and Non-Verbal Content Understanding Verbal and Non-Verbal Content: 4. Understands (complex and basic) - clear comprehension without cues or repetitions   Memory/Recall Ability Memory/Recall Ability : Current season;Location of own room;That he or she is in a hospital/hospital unit   Refer to Care Plan for Long Term Goals  SHORT TERM GOAL WEEK 1 OT Short Term Goal 1 (Week 1): Patient to perform toileting with Min assist and AE as needed OT Short Term Goal 2 (Week 1): Patient to perform shower transfer with Min assist OT Short Term Goal 3 (Week 1): Patient to perform LB dressing with AE set up assist  Recommendations for other services: Adult nurse group and Stress management   Skilled Therapeutic Intervention ADL ADL Eating: Independent Where Assessed-Eating: Bed level Grooming: Setup Where Assessed-Grooming: Sitting at sink Upper Body Bathing: Setup Where Assessed-Upper Body Bathing: Edge of bed Lower Body Bathing: Maximal assistance Where Assessed-Lower Body Bathing: Edge of bed Upper Body Dressing: Setup Where Assessed-Upper Body Dressing: Edge of bed Lower Body Dressing: Maximal assistance Where Assessed-Lower Body Dressing: Edge of bed Toileting: Maximal assistance Where Assessed-Toileting: Teacher, adult education: Curator Method:  Insurance claims handler: Unable to assess Tub/Shower Transfer Method: Unable to assess Film/video editor: Unable to assess Mobility  Bed Mobility Bed Mobility: Right Sidelying to Sit Right Sidelying to Sit: Contact Guard/Touching assist Transfers Sit to Stand: Contact Guard/Touching assist   Discharge Criteria: Patient will be discharged from OT if patient refuses treatment  3 consecutive times without medical reason, if treatment goals not met, if there is a change in medical status, if patient makes no progress towards goals or if patient is discharged from hospital.  The above assessment, treatment plan, treatment alternatives and goals were discussed and mutually agreed upon: by patient  Warnell Forester 05/01/2023, 12:44 PM

## 2023-05-01 NOTE — Evaluation (Signed)
 Physical Therapy Assessment and Plan  Patient Details  Name: Alyssa Blanchard MRN: [id number] Date of Birth: 10-13-1970  PT Diagnosis: Difficulty walking, Dizziness and giddiness, Edema, Muscle weakness, and Pain in joint Rehab Potential: Good ELOS: 5-7 days   Today's Date: 05/01/2023 PT Individual Time: 4098-1191, 1415-1530 PT Individual Time Calculation (min): 60 min, 75 min   Hospital Problem: Principal Problem:   Debility Active Problems:   Acute on chronic diastolic heart failure (HCC)   Past Medical History:  Past Medical History:  Diagnosis Date   Anxiety    Asthma    Atrial fibrillation (HCC)    Depression    DM (diabetes mellitus), type 2 (HCC) 02/16/2022   Dysrhythmia    new onset Afib rvr   GERD (gastroesophageal reflux disease)    Heart failure (HCC)    Heel spur    bilat   History of bronchitis    History of chicken pox    History of urinary tract infection    Hypertension    Migraines    Plantar fasciitis    bilat   STD (sexually transmitted disease)    chl hx & hsv 1&2   Tremors of nervous system    Urinary incontinence    Past Surgical History:  Past Surgical History:  Procedure Laterality Date   BIOPSY  10/14/2022   Procedure: BIOPSY;  Surgeon: Jenel Lucks, MD;  Location: [facility];  Service: Gastroenterology;;   CARDIOVERSION N/A 08/05/2019   Procedure: CARDIOVERSION;  Surgeon: Vesta Mixer, MD;  Location: [facility] ENDOSCOPY;  Service: Cardiovascular;  Laterality: N/A;   CARDIOVERSION N/A 04/28/2023   Procedure: CARDIOVERSION;  Surgeon: Jake Bathe, MD;  Location: [facility] INVASIVE CV LAB;  Service: Cardiovascular;  Laterality: N/A;   COLONOSCOPY WITH PROPOFOL N/A 10/14/2022   Procedure: COLONOSCOPY WITH PROPOFOL;  Surgeon: Jenel Lucks, MD;  Location: [facility] ENDOSCOPY;  Service: Gastroenterology;  Laterality: N/A;   ESOPHAGOGASTRODUODENOSCOPY (EGD) WITH PROPOFOL N/A 10/14/2022   Procedure: ESOPHAGOGASTRODUODENOSCOPY (EGD) WITH PROPOFOL;   Surgeon: Jenel Lucks, MD;  Location: [facility] ENDOSCOPY;  Service: Gastroenterology;  Laterality: N/A;   LAPAROSCOPIC GASTRIC SLEEVE RESECTION N/A 11/27/2014   Procedure: LAPAROSCOPIC GASTRIC SLEEVE RESECTION WITH HIATAL HERNIA REPAIR UPPER ENDOSCOPY;  Surgeon: Luretha Murphy, MD;  Location: [facility] ORS;  Service: General;  Laterality: N/A;   POLYPECTOMY  10/14/2022   Procedure: POLYPECTOMY;  Surgeon: Jenel Lucks, MD;  Location: [facility] ENDOSCOPY;  Service: Gastroenterology;;   TEE WITHOUT CARDIOVERSION N/A 08/05/2019   Procedure: TRANSESOPHAGEAL ECHOCARDIOGRAM (TEE);  Surgeon: Vesta Mixer, MD;  Location: [facility] ENDOSCOPY;  Service: Cardiovascular;  Laterality: N/A;   TONSILLECTOMY  1978    Assessment & Plan Clinical Impression: Alyssa Blanchard is a 53 year old right-handed female well-known to inpatient rehab services with admission 02/28/2022 - 03/11/2022 for debility/morbid obesity with BMI 62.77/right lower extremity cellulitis as well as history of chronic diastolic congestive heart failure, anemia, PAF on Eliquis, asthma, type 2 diabetes mellitus, OSA with CPAP, hypertension, depression/anxiety, quit smoking 11 years ago. Recent admission at [facility] 03/10/18/2025 - 03/17/2023 for acute on chronic congestive heart failure exacerbation and was diuresed aggressively. Weight was 206 kg on admission at that time down to 179 kg on discharge. Per chart review patient lives at home with his mother who is available 24/7. Mother is able to do light housework. Patient independent with a cane and driving prior to admission. 5 steps to entry of home. Presented 04/23/2023 with increasing shortness of breath with progressive fluid buildup lower  extremities x 1 week. Chest x-ray showed mild enlargement of the cardiopericardial silhouette with indistinct pulmonary vascular could not exclude pulmonary venous hypertension. No overt edema. Admission chemistries unremarkable except hemoglobin 9.3, BNP 66.4, troponin negative,  blood cultures no growth to date. Echocardiogram ejection fraction of 70 to 75%. Left ventricle hyperdynamic function. No evidence of mitral stenosis. Hospital course complicated by A-fib with RVR follow-up per cardiology services started on diltiazem and amiodarone for control. Patient did require electrical cardioversion 04/28/2023 per Dr. [name]. She presently remains on Eliquis. Patient she spiked a fever on 04/25/2023 with nausea and emesis. GI pathogen panel positive for norovirus. Blood cultures remain no growth to date, urine culture greater 100,000 E. coli.. Intravenous Rocephin initiated and completed. Right thigh cellulitis and again patient continued on Rocephin until cpmpletion . Therapy evaluations completed due to patient decreased functional mobility/debility was admitted for a comprehensive rehab program.  Patient transferred to CIR on 04/30/2023 .   Patient currently requires min with mobility secondary to muscle weakness and decreased cardiorespiratoy endurance and decreased oxygen support.  Prior to hospitalization, patient was modified independent  with mobility and lived with Family (lives with mom who can provide some housekeeping, etc but no physical assist) in a House home.  Home access is 5Stairs to enter.  Patient will benefit from skilled PT intervention to maximize safe functional mobility, minimize fall risk, and decrease caregiver burden for planned discharge home with 24 hour supervision.  Anticipate patient will benefit from follow up HH at discharge.  PT - End of Session Activity Tolerance: Tolerates 10 - 20 min activity with multiple rests Endurance Deficit: Yes Endurance Deficit Description: fatigues quickly with onset of SOB, intermittent dizziness, sats remained >95% PT Assessment Rehab Potential (ACUTE/IP ONLY): Good PT Barriers to Discharge: Inaccessible home environment;Decreased caregiver support;Home environment access/layout;Wound Care;Weight PT Patient  demonstrates impairments in the following area(s): Balance;Skin Integrity;Edema;Endurance;Pain;Safety PT Transfers Functional Problem(s): Bed Mobility;Bed to Chair;Car PT Locomotion Functional Problem(s): Ambulation;Stairs PT Plan PT Intensity: Minimum of 1-2 x/day ,45 to 90 minutes PT Frequency: 5 out of 7 days PT Duration Estimated Length of Stay: 5-7 days PT Treatment/Interventions: Ambulation/gait training;Cognitive remediation/compensation;Discharge planning;DME/adaptive equipment instruction;Functional mobility training;Pain management;Psychosocial support;Splinting/orthotics;Therapeutic Activities;UE/LE Strength taining/ROM;Visual/perceptual remediation/compensation;Wheelchair propulsion/positioning;UE/LE Coordination activities;Therapeutic Exercise;Stair training;Skin care/wound management;Patient/family education;Neuromuscular re-education;Functional electrical stimulation;Disease management/prevention;Community reintegration;Balance/vestibular training PT Transfers Anticipated Outcome(s): mod I with LRAD PT Locomotion Anticipated Outcome(s): mod I with LRAD PT Recommendation Follow Up Recommendations: Home health PT Patient destination: Home Equipment Recommended: To be determined   PT Evaluation Precautions/Restrictions Precautions Precautions: Fall Recall of Precautions/Restrictions: Intact Restrictions Weight Bearing Restrictions Per Provider Order: No General   Vital SignsTherapy Vitals Temp: 98.8 F (37.1 C) Pulse Rate: 92 Resp: 18 BP: (!) 131/91 Patient Position (if appropriate): Sitting Oxygen Therapy SpO2: 93 % O2 Device: Room Air Pain Pain Assessment Pain Scale: 0-10 Pain Score: 4  Pain Type: Acute pain Pain Location: Shoulder Pain Orientation: Left Pain Descriptors / Indicators: Aching Pain Onset: On-going Patients Stated Pain Goal: 0 Multiple Pain Sites: No Pain Interference Pain Interference Pain Effect on Sleep: 4. Almost constantly Pain  Interference with Therapy Activities: 4. Almost constantly Pain Interference with Day-to-Day Activities: 4. Almost constantly Home Living/Prior Functioning Home Living Available Help at Discharge: Family;Available 24 hours/day Type of Home: House Home Access: Stairs to enter [employer] of Steps: 5 Entrance Stairs-Rails: Can reach both Home Layout: One level;Other (Comment) (has sunken living room with 1 step down, 1 step up to bathroom) Bathroom Shower/Tub: Engineer, manufacturing systems:  Handicapped height Bathroom Accessibility: Yes Additional Comments: Pt sleeps in hospital bed which is currently broken, says she needs a new one. Lives with mother who is able to do light housework.  Lives With: Family (lives with mom who can provide some housekeeping, etc but no physical assist) Prior Function Level of Independence: Requires assistive device for independence;Independent with gait;Independent with transfers (using cane or RW pending symptoms)  Able to Take Stairs?: Yes Driving: Yes Vocation: Unemployed (needs to get on disability) Vision/Perception  Vision - History Ability to See in Adequate Light: 0 Adequate Vision - Assessment Additional Comments: No acute changes Perception Perception: Within Functional Limits Praxis Praxis: WFL  Cognition Overall Cognitive Status: Within Functional Limits for tasks assessed Arousal/Alertness: Awake/alert Orientation Level: Oriented X4 Memory: Appears intact Awareness: Appears intact Problem Solving: Appears intact Safety/Judgment: Appears intact Sensation Sensation Light Touch: Appears Intact Hot/Cold: Appears Intact Proprioception: Appears Intact Stereognosis: Appears Intact Coordination Gross Motor Movements are Fluid and Coordinated: No Coordination and Movement Description: Patient with onset of L shoulder pain that limits smooth LUE reach. Motor  Motor Motor: Within Functional Limits   Trunk/Postural  Assessment  Cervical Assessment Cervical Assessment: Within Functional Limits Thoracic Assessment Thoracic Assessment: Within Functional Limits Lumbar Assessment Lumbar Assessment: Within Functional Limits Postural Control Postural Control: Within Functional Limits  Balance Balance Balance Assessed: Yes Static Sitting Balance Static Sitting - Balance Support: Feet supported Static Sitting - Level of Assistance: 5: Stand by assistance Dynamic Sitting Balance Dynamic Sitting - Balance Support: Feet supported Dynamic Sitting - Level of Assistance: 5: Stand by assistance Static Standing Balance Static Standing - Balance Support: During functional activity Static Standing - Level of Assistance: 5: Stand by assistance Dynamic Standing Balance Dynamic Standing - Balance Support: During functional activity Dynamic Standing - Level of Assistance: 5: Stand by assistance Extremity Assessment  RUE Assessment RUE Assessment: Within Functional Limits General Strength Comments: 4/5 grossly LUE Assessment Active Range of Motion (AROM) Comments: limited shoulder flexion due to current muscle pain. General Strength Comments: strength functional for ADL performance RLE Assessment RLE Assessment: Exceptions to [facility] General Strength Comments: Grossly 4-/5 LLE Assessment LLE Assessment: Exceptions to [facility] General Strength Comments: Grossly 4-/5  Care Tool Care Tool Bed Mobility Roll left and right activity   Roll left and right assist level: Supervision/Verbal cueing    Sit to lying activity   Sit to lying assist level: Contact Guard/Touching assist    Lying to sitting on side of bed activity   Lying to sitting on side of bed assist level: the ability to move from lying on the back to sitting on the side of the bed with no back support.: Contact Guard/Touching assist     Care Tool Transfers Sit to stand transfer   Sit to stand assist level: Contact Guard/Touching assist    Chair/bed  transfer   Chair/bed transfer assist level: Contact Guard/Touching assist    Car transfer Car transfer activity did not occur: Safety/medical concerns        Care Tool Locomotion Ambulation   Assist level: Contact Guard/Touching assist Assistive device: Walker-rolling Max distance: 244 ft  Walk 10 feet activity   Assist level: Contact Guard/Touching assist Assistive device: Walker-rolling   Walk 50 feet with 2 turns activity   Assist level: Contact Guard/Touching assist Assistive device: Walker-rolling  Walk 150 feet activity   Assist level: Contact Guard/Touching assist Assistive device: Walker-rolling  Walk 10 feet on uneven surfaces activity Walk 10 feet on uneven surfaces activity did not  occur: Safety/medical concerns      Stairs   Assist level: Minimal Assistance - Patient > 75% Stairs assistive device: 2 hand rails Max number of stairs: 4  Walk up/down 1 step activity   Walk up/down 1 step (curb) assist level: Minimal Assistance - Patient > 75% Walk up/down 1 step or curb assistive device: 2 hand rails  Walk up/down 4 steps activity   Walk up/down 4 steps assist level: Minimal Assistance - Patient > 75% Walk up/down 4 steps assistive device: 2 hand rails  Walk up/down 12 steps activity Walk up/down 12 steps activity did not occur: Safety/medical concerns      Pick up small objects from floor Pick up small object from the floor (from standing position) activity did not occur: Safety/medical concerns      Wheelchair Is the patient using a wheelchair?: Yes (transport only) Type of Wheelchair: Manual   Wheelchair assist level: Dependent - Patient 0% Max wheelchair distance: 150  Wheel 50 feet with 2 turns activity   Assist Level: Dependent - Patient 0%  Wheel 150 feet activity   Assist Level: Dependent - Patient 0%    Refer to Care Plan for Long Term Goals  SHORT TERM GOAL WEEK 1 PT Short Term Goal 1 (Week 1): =LTGs d/t ELOS  Recommendations for other  services: None   Skilled Therapeutic Intervention Mobility Bed Mobility Bed Mobility: Supine to Sit Right Sidelying to Sit: Contact Guard/Touching assist Supine to Sit: Contact Guard/Touching assist Transfers Transfers: Sit to Stand;Stand Pivot Transfers;Transfer Sit to Stand: Contact Guard/Touching assist Stand Pivot Transfers: Contact Guard/Touching assist Transfer (Assistive device): Rolling walker Locomotion  Gait Ambulation: Yes Gait Assistance: Contact Guard/Touching assist Gait Distance (Feet): 243 Feet Assistive device: Rolling walker Gait Gait: Yes Gait Pattern: Within Functional Limits Gait velocity: decreased Stairs / Additional Locomotion Stairs: Yes Stairs Assistance: Minimal Assistance - Patient > 75% Stair Management Technique: Two rails Number of Stairs: 4 Height of Stairs: 6 Wheelchair Mobility Wheelchair Mobility: No  Session 1: Evaluation completed (see details above) with patient education regarding purpose of PT evaluation, PT POC and goals, therapy schedule, weekly team meetings, and other CIR information including safety plan and fall risk safety. Pt performed the below functional mobility tasks with the specified levels of skilled cuing and assistance.  Pt was assisted to the bathroom with continent B+B void, documented in flow sheet, tot a for posterior pericare. Pt noted to become pale and report lightheadedness and dizziness after standing for peri care. Returned to bed and BP found to be 140/72 and symptoms quickly resolved. Pt remained in bed and was left with all needs in reach and alarm active. Pt recd pain medication for 8/10 shoulder pain during session.    Session 2:  pt received in bed and agreeable to therapy. Pt reports 7/10 in her L shoulder and upper trap. Discussed self upper trap stretch for some relief.   Pt assisted to bathroom with CGA and RW for continent bladder void, CGA for 3/3 toileting tasks. Documented in flow sheet. Hand  hygiene in standing with CGA.   Pt transported to therapy gym for time management and energy conservation. Pt then navigated x 4 steps with min a and 2 hand rails. Assist in case of LE weakness, but no knee buckling noted.   Pt participated in 6 min walk test as documented below. On return to room, pt performed Sit to stand from bed with 10 lb dumbbell to increase intensity of exercise, 2 x 6  and 2 x 8 with seated rest breaks. Pt able to maintain O2 sats > 95 through all activities this session and returned to bed after with CGA, was left with all needs in reach and alarm active.   6 Min Walk Test:  Instructed patient to ambulate as quickly and as safely as possible for 6 minutes using LRAD. Patient was allowed to take standing rest breaks without stopping the test, but if the patient required a sitting rest break the clock would be stopped and the test would be over. Pt needed seated rest break at 5 min and could not complete 6 min test at this time. Results: 234 feet (78 meters, Avg speed .73m/s) using a RW with CGA. Results indicate that the patient has reduced endurance with ambulation compared to age matched norms.  Age Matched Norms: 57-69 yo M: 13 F: 67, 49-79 yo M: 48 F: 471, 70-89 yo M: 417 F: 392 MDC: 58.21 meters (190.98 feet) or 50 meters (ANPTA Core Set of Outcome Measures for Adults with Neurologic Conditions, 2018)      Discharge Criteria: Patient will be discharged from PT if patient refuses treatment 3 consecutive times without medical reason, if treatment goals not met, if there is a change in medical status, if patient makes no progress towards goals or if patient is discharged from hospital.  The above assessment, treatment plan, treatment alternatives and goals were discussed and mutually agreed upon: by patient  Juluis Rainier 05/01/2023, 4:21 PM

## 2023-05-02 ENCOUNTER — Encounter (HOSPITAL_COMMUNITY): Payer: Self-pay | Admitting: Physical Medicine and Rehabilitation

## 2023-05-02 ENCOUNTER — Other Ambulatory Visit: Payer: Self-pay

## 2023-05-02 DIAGNOSIS — E1165 Type 2 diabetes mellitus with hyperglycemia: Secondary | ICD-10-CM

## 2023-05-02 DIAGNOSIS — I509 Heart failure, unspecified: Secondary | ICD-10-CM

## 2023-05-02 DIAGNOSIS — I480 Paroxysmal atrial fibrillation: Secondary | ICD-10-CM

## 2023-05-02 DIAGNOSIS — I5023 Acute on chronic systolic (congestive) heart failure: Secondary | ICD-10-CM

## 2023-05-02 DIAGNOSIS — B379 Candidiasis, unspecified: Secondary | ICD-10-CM

## 2023-05-02 DIAGNOSIS — Z794 Long term (current) use of insulin: Secondary | ICD-10-CM

## 2023-05-02 HISTORY — DX: Heart failure, unspecified: I50.9

## 2023-05-02 LAB — GLUCOSE, CAPILLARY
Glucose-Capillary: 150 mg/dL — ABNORMAL HIGH (ref 70–99)
Glucose-Capillary: 161 mg/dL — ABNORMAL HIGH (ref 70–99)
Glucose-Capillary: 165 mg/dL — ABNORMAL HIGH (ref 70–99)
Glucose-Capillary: 173 mg/dL — ABNORMAL HIGH (ref 70–99)

## 2023-05-02 MED ORDER — BUMETANIDE 1 MG PO TABS
1.0000 mg | ORAL_TABLET | Freq: Two times a day (BID) | ORAL | Status: DC
  Administered 2023-05-02 – 2023-05-12 (×20): 1 mg via ORAL
  Filled 2023-05-02 (×21): qty 1

## 2023-05-02 MED ORDER — FLUCONAZOLE 150 MG PO TABS
150.0000 mg | ORAL_TABLET | Freq: Once | ORAL | Status: AC
  Administered 2023-05-02: 150 mg via ORAL
  Filled 2023-05-02: qty 1

## 2023-05-02 NOTE — Progress Notes (Addendum)
 PROGRESS NOTE   Subjective/Complaints: Pt had 2 Bms yesterday. She reports having yeast infection that started yesterday.  She is waiting on her bariatric non air mattress.  She asks if bumex could be given earlier if the afternoon.    ROS:  ROS: Patient denies fever, new vision changes,  nausea, vomiting, diarrhea,  shortness of breath or chest pain, headache, or mood change.    Objective:   No results found. Recent Labs    05/01/23 0604  WBC 7.4  HGB 10.0*  HCT 33.2*  PLT 170   Recent Labs    04/30/23 0219 05/01/23 0604  NA 136 135  K 3.8 3.8  CL 92* 92*  CO2 32 31  GLUCOSE 142* 132*  BUN 9 9  CREATININE 0.75 0.74  CALCIUM 8.4* 8.7*    Intake/Output Summary (Last 24 hours) at 05/02/2023 0912 Last data filed at 05/02/2023 0719 Gross per 24 hour  Intake 358 ml  Output 550 ml  Net -192 ml        Physical Exam: Vital Signs Blood pressure (!) 143/77, pulse 91, temperature 97.9 F (36.6 C), temperature source Oral, resp. rate 18, height 5\' 5"  (1.651 m), weight (!) 178.8 kg, last menstrual period 12/20/2017, SpO2 97%.   General: awake, alert, appropriate, sitting up in bed HENT: conjugate gaze; oropharynx dry on Room air today CV: regular rate; and rhythm no JVD Pulmonary: CTA B/L; no W/R/R- - decreased sounds overall GI: a little firmer; NT; distended vs protuberant; hypoactive BS Psychiatric: appropriate Neurological: Ox3 Musculoskeletal:     Cervical back: Neck supple.     Comments: Ue's 5/5 throughout LE's 5-/5 throughout- B/L  Skin:    General: Skin is warm.     Comments: no LE edema B/L- don't see any this AM Some eczema- not bad currently Heels dried/cracked    Assessment/Plan: 1. Functional deficits which require 3+ hours per day of interdisciplinary therapy in a comprehensive inpatient rehab setting. Physiatrist is providing close team supervision and 24 hour management of active  medical problems listed below. Physiatrist and rehab team continue to assess barriers to discharge/monitor patient progress toward functional and medical goals  Care Tool:  Bathing    Body parts bathed by patient: Right arm, Left arm, Chest, Abdomen, Front perineal area, Left upper leg, Right upper leg, Face   Body parts bathed by helper: Right lower leg, Left lower leg, Buttocks     Bathing assist Assist Level: Moderate Assistance - Patient 50 - 74%     Upper Body Dressing/Undressing Upper body dressing   What is the patient wearing?: Pull over shirt    Upper body assist Assist Level: Set up assist    Lower Body Dressing/Undressing Lower body dressing      What is the patient wearing?: Underwear/pull up, Pants     Lower body assist Assist for lower body dressing: Maximal Assistance - Patient 25 - 49%     Toileting Toileting    Toileting assist Assist for toileting: Maximal Assistance - Patient 25 - 49%     Transfers Chair/bed transfer  Transfers assist     Chair/bed transfer assist level: Contact Guard/Touching assist  Locomotion Ambulation   Ambulation assist      Assist level: Contact Guard/Touching assist Assistive device: Walker-rolling Max distance: 244 ft   Walk 10 feet activity   Assist     Assist level: Contact Guard/Touching assist Assistive device: Walker-rolling   Walk 50 feet activity   Assist    Assist level: Contact Guard/Touching assist Assistive device: Walker-rolling    Walk 150 feet activity   Assist    Assist level: Contact Guard/Touching assist Assistive device: Walker-rolling    Walk 10 feet on uneven surface  activity   Assist Walk 10 feet on uneven surfaces activity did not occur: Safety/medical concerns         Wheelchair     Assist Is the patient using a wheelchair?: Yes (transport only) Type of Wheelchair: Manual    Wheelchair assist level: Dependent - Patient 0% Max wheelchair  distance: 150    Wheelchair 50 feet with 2 turns activity    Assist        Assist Level: Dependent - Patient 0%   Wheelchair 150 feet activity     Assist      Assist Level: Dependent - Patient 0%   Blood pressure (!) 143/77, pulse 91, temperature 97.9 F (36.6 C), temperature source Oral, resp. rate 18, height 5\' 5"  (1.651 m), weight (!) 178.8 kg, last menstrual period 12/20/2017, SpO2 97%.  Medical Problem List and Plan: 1. Functional deficits secondary to debility related to acute on chronic diastolic congestive heart failure with exacerbation- diuresed significant weight             -patient may  shower             -ELOS/Goals: 5-7 days mod I- doesn't have help at home  -Continue CIR- con't CIR PT and OT   2.  Antithrombotics: -DVT/anticoagulation:  Pharmaceutical: Eliquis             -antiplatelet therapy: N/A 3. Pain Management: Voltaren gel twice daily, Lyrica 200 mg twice daily, hydrocodone as needed  2/21- pain usually controlled- con't regimen 4. Mood/Behavior/Sleep: Melatonin 10 mg nightly             -antipsychotic agents: N/A 5. Neuropsych/cognition: This patient is capable of making decisions on her own behalf. 6. Skin/Wound Care: Routine skin checks 7. Fluids/Electrolytes/Nutrition: Routine in and outs with follow-up chemistries 8.  PAF/A-fib with RVR.  Cardioversion 04/28/2023.  Cardiac rate controlled.  Toprol-XL 75 mg daily  2/21- sounds in regular rhythm- con't regimen  2/22 HR sounds regular again today, continue to monitor 9.  E. coli UTI.  Completed course of Rocephin 10.  Right thigh cellulitis.  Completed course of Rocephin 11.  Gastroenteritis secondary to norovirus/diarrhea.  Improving.  Supportive care..  Enteric precautions- no Sx's for 4 days, can stop if OK with Infection prevention  2/21- can stop since been 5 days since last Sx. Actually is constipated now 12.  Asthma.  Continue inhalers as directed check oxygen saturations every  shift 13.  OSA.  CPAP 14.  Acute on chronic CHF exacerbation.  Monitor for any signs of fluid overload.  Continue Bumex 1 mg twice daily, Aldactone 25 mg daily  2/21- weight up 4 kg- however bed was changed- will monitor trend- no LE edema  2/22 Will adjust Bumex timing to 3pm  Filed Weights   05/01/23 0500  Weight: (!) 178.8 kg     15.  Acute on chronic anemia.  Niferex daily.  Check CBC 16.  Diabetes mellitus.  Latest hemoglobin A1c 6.7.  Presently on SSI.  Patient on Ozempic prior to admission.  2/21- BG's 109-229- only 1 value over 200- will see if family cane bring in Ozempic  2/22 fair control, continue to monitor  CBG (last 3)  Recent Labs    05/01/23 1704 05/01/23 2116 05/02/23 0605  GLUCAP 193* 201* 150*    17.  Obesity.  BMI 62.77.  Dietary follow-up  2/21- weight 178.8 kg- up a lot form yesterday but has different bed- will monitor trend 18. Eczema- usually takes Dupixant- will see if can restart. Pt can bring in from home 19. Constipation- LBM 4 days ago- wants to wait on Sorbitol  2/21- will give Sorbitol Saturday if no BM- so far it's been 5 days- no Bariatric BSC in room.   2/22 reports large BM yesterday, continue to monitor bowel function 20. Preventative- will do vaseline for heels and change to regular bed per pt- see if can get Zyrtec from home to take since don't have here. 21. Yeast infection -2/22 fluconazole 150mg  1x      LOS: 2 days A FACE TO FACE EVALUATION WAS PERFORMED  Fanny Dance 05/02/2023, 9:12 AM

## 2023-05-03 ENCOUNTER — Inpatient Hospital Stay (HOSPITAL_COMMUNITY): Payer: Medicaid Other

## 2023-05-03 DIAGNOSIS — R110 Nausea: Secondary | ICD-10-CM

## 2023-05-03 LAB — GLUCOSE, CAPILLARY
Glucose-Capillary: 157 mg/dL — ABNORMAL HIGH (ref 70–99)
Glucose-Capillary: 188 mg/dL — ABNORMAL HIGH (ref 70–99)
Glucose-Capillary: 183 mg/dL — ABNORMAL HIGH (ref 70–99)
Glucose-Capillary: 194 mg/dL — ABNORMAL HIGH (ref 70–99)

## 2023-05-03 LAB — RENAL FUNCTION PANEL
Albumin: 3.2 g/dL — ABNORMAL LOW (ref 3.5–5.0)
Anion gap: 11 (ref 5–15)
BUN: 10 mg/dL (ref 6–20)
CO2: 31 mmol/L (ref 22–32)
Calcium: 9.1 mg/dL (ref 8.9–10.3)
Chloride: 95 mmol/L — ABNORMAL LOW (ref 98–111)
Creatinine, Ser: 0.8 mg/dL (ref 0.44–1.00)
GFR, Estimated: >60 mL/min (ref >60)
Glucose, Bld: 160 mg/dL — ABNORMAL HIGH (ref 70–99)
Phosphorus: 4.1 mg/dL (ref 2.5–4.6)
Potassium: 3.8 mmol/L (ref 3.5–5.1)
Sodium: 137 mmol/L (ref 135–145)

## 2023-05-03 LAB — MAGNESIUM: Magnesium: 1.9 mg/dL (ref 1.7–2.4)

## 2023-05-03 MED ORDER — POTASSIUM CHLORIDE CRYS ER 20 MEQ PO TBCR
40.0000 meq | EXTENDED_RELEASE_TABLET | ORAL | Status: AC
Start: 2023-05-03 — End: 2023-05-03
  Administered 2023-05-03: 40 meq via ORAL
  Filled 2023-05-03: qty 2

## 2023-05-03 MED ORDER — MAGNESIUM OXIDE -MG SUPPLEMENT 400 (240 MG) MG PO TABS
400.0000 mg | ORAL_TABLET | Freq: Once | ORAL | Status: AC
  Administered 2023-05-03: 400 mg via ORAL
  Filled 2023-05-03: qty 1

## 2023-05-03 MED ORDER — METHOCARBAMOL 500 MG PO TABS
500.0000 mg | ORAL_TABLET | Freq: Four times a day (QID) | ORAL | Status: DC | PRN
  Administered 2023-05-03 – 2023-05-12 (×23): 500 mg via ORAL
  Filled 2023-05-03 (×22): qty 1

## 2023-05-03 MED ORDER — ONDANSETRON HCL 4 MG PO TABS
4.0000 mg | ORAL_TABLET | Freq: Three times a day (TID) | ORAL | Status: DC | PRN
  Administered 2023-05-03 – 2023-05-10 (×2): 4 mg via ORAL
  Filled 2023-05-03 (×2): qty 1

## 2023-05-03 NOTE — Progress Notes (Signed)
 Occupational Therapy Session Note  Patient Details  Name: Alyssa Blanchard MRN: [id number] Date of Birth: [birth year]  Today's Date: 05/03/2023 OT Individual Time:0800-0923 & 1355-1445 OT Individual Time Calculation (min): 83 min & 50 min    Short Term Goals: Week 1:  OT Short Term Goal 1 (Week 1): Patient to perform toileting with Min assist and AE as needed OT Short Term Goal 2 (Week 1): Patient to perform shower transfer with Min assist OT Short Term Goal 3 (Week 1): Patient to perform LB dressing with AE set up assist  Skilled Therapeutic Interventions/Progress Updates:      Therapy Documentation Precautions:  Precautions Precautions: Fall Recall of Precautions/Restrictions: Intact Restrictions Weight Bearing Restrictions Per Provider Order: No Session 1 General: "I have heard good things about you!" Pt supine in bed upon OT arrival, agreeable to OT session.  Pain: 7/10 pain reported in Lt shoulder at start of session, heat, positioning, activity, intermittent rest breaks, distractions provided for pain management, pt reports tolerable to proceed. Increased pain after washing hair, pt left with heating pads at end of session for pain relief and was medicated after session.  ADL: Pt completed ADL this date for increased endurance and activity tolerance in order to complete daily tasks. Pt completed the following activities at levels of assist provided: Bed mobility: SBA with HOB raised using bed rail supine><EOB Grooming/oral hygiene: set-up seated at sink in W/C shaving, washing face and completing oral hygiene, able to manipulate containers Toilet transfer/toileting: CGA with RW ambulating from room>standard toilet, voided urine, able to complete pants management and hygiene UB dressing: SBA seated EOB for doffing/donning overhead shirt LB dressing: CGA using reacher to manage over feet and standing to manage over waist with no LOB/SOB Footwear: total A for donning ACE wraps  and socks Bathing: SBA for UB bathing at sink with wash cloths, pt awaiting bariatric shower chair in order to bathe in shower, OT assisted with washing hair with head trough in sink, pt noted increased pain in shoulder after position in which hair was washed, MD aware   Pt supine in bed with bed alarm activated, 2 bed rails up, call light within reach and 4Ps assessed.   Session 2 General: "I'll see you tomorrow!" Pt supine in bed upon OT arrival, agreeable to OT session.  Pain:  unrated pain reported in Lt shoulder, heat, activity, intermittent rest breaks, distractions provided for pain management, pt reports tolerable to proceed. Pt reports 4/10 nausea as well.   Exercises: Pt dependently transferred to gym in W/C for time management. Pt then transfers CGA with no device to standard chair with arm rests and no LOB/SOB. OT instructed pt on completing cervical stretches in order to decrease pain/tightness in Lt shoulder. Pt completed the following seated in standard chair with arms:  -cervical flexion/extension -cervical lateral flexion -table slides with LUE and prolonged stretch Pt reporting no pain with exercises  OT provided heat pack in preparation for soft tissue massage. OT massaged upper trapezius on Lt side in order to ease muscle tightness and pain. OT provided heat pack after light massage.     Other Treatments: OT using therapeutic use of self in order to listen to pt current situation and understanding of pt perspective.    Pt supine in bed with bed alarm activated, 2 bed rails up, call light within reach and 4Ps assessed.   Therapy/Group: Individual Therapy  Velia Meyer, OTD, OTR/L 05/03/2023, 12:27 PM

## 2023-05-03 NOTE — Progress Notes (Signed)
   05/03/23 2022  BiPAP/CPAP/SIPAP  Reason BIPAP/CPAP not in use Other(comment) (pt stated she couldn't tolerate the mask and doesnt want to try tonight.  pt wants RT to help her durig the day so she can get use to it. will as dayshift RT)  BiPAP/CPAP /SiPAP Vitals  Pulse Rate 80  Resp 16  SpO2 98 %  Bilateral Breath Sounds Clear;Diminished  MEWS Score/Color  MEWS Score 0  MEWS Score Color Green

## 2023-05-03 NOTE — Progress Notes (Signed)
 Physical Therapy Session Note  Patient Details  Name: Alyssa Blanchard MRN: [id number] Date of Birth: 12-Nov-1970  Today's Date: 05/03/2023 PT Individual Time: 1020-1123 PT Individual Time Calculation (min): 63 min   Short Term Goals: Week 1:  PT Short Term Goal 1 (Week 1): =LTGs d/t ELOS  Skilled Therapeutic Interventions/Progress Updates:    Pt presents in room in bed, states she feels "funky" and has been having dizziness, nausea, and shooting pain in LUE with some numbness in L middle finger. Pt requesting to use restroom. Session focused on gait training and education on different ambulation devices, as well as therapeutic activities and modalities to decrease L neck pain, tightness, and referred pain.  Pt completes bed mobility with supervision to sit on EOB, pt reporting dizziness. Therapist educates pt on BP changes with positioning and recommends increased time in sitting to acclimate to position change with pt reporting reduced dizziness. Pt then stands to RW and ambulates with CGA/close supervision to sit on toilet, pt able to manage 3/3 toileting tasks with supervision. Pt ambulates to sink with supervision with RW to complete hand hygiene. Pt comes to sitting in WC.  Therapist assist with donning socks and shoes total assist to manage edema. Pt transported to main gym in [facility] dependently for time management and energy conservation. Pt provided with moist heat to upper traps to decrease neck and LUE pain x8 minutes while therapist retrieves rollator for trial. Pt educated on benefits for rollator use as well as size and weight requirements on rollator with pt verbalizing understanding.  Therapist then  provides x5 minutes soft tissue mobilization to L upper trapezius muscles, with pt demonstrating trigger points in L upper trapezius muscle recreating reported pain with deep pressure. Pt reports feeling improved pain with modalities.  Pt then ambulates ~40' with rollator with CGA pt  reporting some dizziness with ambulation and comes to sitting on EOM. While pt taking seated rest break, therapist educates pt on differences between bariatric and extra wide bariatric rollators, pt trials sitting on rollator with pt demonstrating appropriate fit and reports feeling comfortable seated on rollator. Pt demonstrates good carryover of education on rollator brakes as well as parking rollator against stable surface prior to sitting. Pt then ambulates ~40' with rollator and CGA back to [facility], reporting feeling increased dizziness and nausea.  Pt returned to room via WC dependently and pt completes stand step transfer with RW back to bed. Therapist manages shoes and socks dependently for time management and pt returns to supine with supervision. Pt vitals assessed in supine to be BP 134/70, HR 74. Pt requesting anti nausea medication, therapist communicates with RN. Pt remains supine with all needs wihtin reach, call light in plcae, and bed alarm activated at end of session.    Therapy Documentation Precautions:  Precautions Precautions: Fall Recall of Precautions/Restrictions: Intact Restrictions Weight Bearing Restrictions Per Provider Order: No  Therapy/Group: Individual Therapy  Edwin Cap PT, DPT 05/03/2023, 11:43 AM

## 2023-05-03 NOTE — IPOC Note (Signed)
 Overall Plan of Care [facility]) Patient Details Name: Alyssa Blanchard MRN: [id number] DOB: 1971/03/08  Admitting Diagnosis: Debility  Hospital Problems: Principal Problem:   Debility Active Problems:   Acute on chronic diastolic heart failure (HCC)   Type 2 diabetes mellitus with hyperglycemia, without long-term current use of insulin (HCC)   Acute on chronic systolic CHF (congestive heart failure) (HCC)   Acute on chronic congestive heart failure (HCC)     Functional Problem List: Nursing Bladder, Edema, Endurance, Medication Management, Pain, Perception, Safety, Skin Integrity, Sensory  PT Balance, Skin Integrity, Edema, Endurance, Pain, Safety  OT Balance, Endurance, Safety, Pain  SLP    TR         Basic ADL's: OT Bathing, Dressing, Toileting     Advanced  ADL's: OT Simple Meal Preparation, Laundry, Light Housekeeping     Transfers: PT Bed Mobility, Bed to Chair, Customer service manager, Tub/Shower     Locomotion: PT Ambulation, Stairs     Additional Impairments: OT Fuctional Use of Upper Extremity  SLP        TR      Anticipated Outcomes Item Anticipated Outcome  Self Feeding Independent  Swallowing      Basic self-care  Mod Independent  Toileting  Mod Independent   Bathroom Transfers Mod independnet  Bowel/Bladder  manage bowesl with medications/ manage bladder with time toileting  Transfers  mod I with LRAD  Locomotion  mod I with LRAD  Communication     Cognition     Pain  <4 w/ prns  Safety/Judgment  manage safety with supervision   Therapy Plan: PT Intensity: Minimum of 1-2 x/day ,45 to 90 minutes PT Frequency: 5 out of 7 days PT Duration Estimated Length of Stay: 5-7 days OT Intensity: Minimum of 1-2 x/day, 45 to 90 minutes OT Frequency: 5 out of 7 days OT Duration/Estimated Length of Stay: 5-10 days     Team Interventions: Nursing Interventions Patient/Family Education, Medication Management, Psychosocial Support, Bladder Management,  Disease Management/Prevention, Pain Management, Discharge Planning, Dysphagia/Aspiration Precaution Training, Skin Care/Wound Management  PT interventions Ambulation/gait training, Cognitive remediation/compensation, Discharge planning, DME/adaptive equipment instruction, Functional mobility training, Pain management, Psychosocial support, Splinting/orthotics, Therapeutic Activities, UE/LE Strength taining/ROM, Visual/perceptual remediation/compensation, Wheelchair propulsion/positioning, UE/LE Coordination activities, Therapeutic Exercise, Stair training, Skin care/wound management, Patient/family education, Neuromuscular re-education, Functional electrical stimulation, Disease management/prevention, Firefighter, Warden/ranger  OT Interventions Warden/ranger, Community reintegration, Disease mangement/prevention, Equities trader education, Self Care/advanced ADL retraining, Therapeutic Exercise, UE/LE Coordination activities, UE/LE Strength taining/ROM, Therapeutic Activities, Pain management, Functional mobility training, DME/adaptive equipment instruction, Discharge planning  SLP Interventions    TR Interventions    SW/CM Interventions Discharge Planning, Psychosocial Support, Patient/Family Education   Barriers to Discharge MD  Medical stability  Nursing Decreased caregiver support, Home environment access/layout, Incontinence, Wound Care, Weight Discharge Home Layout: One level, Other (Comment) (has steps up and down to bathroom and living room)  Discharge Home Access: Stairs to enter  Entrance Stairs-Rails: Right, Left, Can reach both  Entrance Stairs-Number of Steps: 5  PT Inaccessible home environment, Decreased caregiver support, Home environment access/layout, Wound Care, Weight    OT Decreased caregiver support, Weight Patient reports mother is unable to assist with IADL's at this time  SLP      SW Decreased caregiver support, Lack of/limited family  support, Weight, Home environment access/layout     Team Discharge Planning: Destination: PT-Home ,OT- Home , SLP-  Projected Follow-up: PT-Home health PT, OT- TBD Home health OT, SLP-  Projected Equipment Needs: PT-To be determined, OT-  , SLP-  Equipment Details: PT- , OT-  Patient/family involved in discharge planning: PT- Patient,  OT-Patient, SLP-   MD ELOS: 5-10 Medical Rehab Prognosis:  Excellent Assessment: The patient has been admitted for CIR therapies with the diagnosis of debility related to acute on chronic diastolic congestive heart failure with exacerbation . The team will be addressing functional mobility, strength, stamina, balance, safety, adaptive techniques and equipment, self-care, bowel and bladder mgt, patient and caregiver education. Goals have been set at Mod I. Anticipated discharge destination is home.        See Team Conference Notes for weekly updates to the plan of care

## 2023-05-03 NOTE — Progress Notes (Addendum)
 PROGRESS NOTE   Subjective/Complaints: Yeast infection symptoms improved today. Reports for about a week she has had pain shooting down her left arm, worsened when moving neck while washing hair.  She also has had nausea this AM, worse with being upright and activity.   LBM today 2/23  ROS: Patient denies fever, new vision changes,  nausea, vomiting, diarrhea,  shortness of breath or chest pain, headache, or mood change. +nausea    Objective:   No results found. Recent Labs    05/01/23 0604  WBC 7.4  HGB 10.0*  HCT 33.2*  PLT 170   Recent Labs    05/01/23 0604  NA 135  K 3.8  CL 92*  CO2 31  GLUCOSE 132*  BUN 9  CREATININE 0.74  CALCIUM 8.7*    Intake/Output Summary (Last 24 hours) at 05/03/2023 1133 Last data filed at 05/03/2023 0756 Gross per 24 hour  Intake 1074 ml  Output 1000 ml  Net 74 ml        Physical Exam: Vital Signs Blood pressure (!) 117/57, pulse 73, temperature 98.1 F (36.7 C), resp. rate 17, height 5\' 5"  (1.651 m), weight (!) 174.7 kg, last menstrual period 12/20/2017, SpO2 98%.   General: awake, alert, appropriate, laying in bed HENT: conjugate gaze; oropharynx moist on Room air today CV: regular rate; and rhythm no JVD Pulmonary: CTA B/L; no W/R/R- - decreased sounds overall GI: NT, distended, +BS- hypoactive Psychiatric: appropriate Neurological: Ox3 Reports altered sensation medical left hand Musculoskeletal:     Cervical back: Neck supple.     Moving all 4 extremities to gravity and resistance  TTP L periscapular muscles Tinels at elbow- equivocal Mild L trap pain with L shoulder abduction and ROM   Skin:    General: Skin is warm.     Comments: no LE edema B/L Some eczema- not bad currently Heels dried/cracked    Assessment/Plan: 1. Functional deficits which require 3+ hours per day of interdisciplinary therapy in a comprehensive inpatient rehab  setting. Physiatrist is providing close team supervision and 24 hour management of active medical problems listed below. Physiatrist and rehab team continue to assess barriers to discharge/monitor patient progress toward functional and medical goals  Care Tool:  Bathing    Body parts bathed by patient: Right arm, Left arm, Chest, Abdomen, Front perineal area, Left upper leg, Right upper leg, Face   Body parts bathed by helper: Right lower leg, Left lower leg, Buttocks     Bathing assist Assist Level: Moderate Assistance - Patient 50 - 74%     Upper Body Dressing/Undressing Upper body dressing   What is the patient wearing?: Pull over shirt    Upper body assist Assist Level: Set up assist    Lower Body Dressing/Undressing Lower body dressing      What is the patient wearing?: Underwear/pull up, Pants     Lower body assist Assist for lower body dressing: Maximal Assistance - Patient 25 - 49%     Toileting Toileting    Toileting assist Assist for toileting: Maximal Assistance - Patient 25 - 49%     Transfers Chair/bed transfer  Transfers assist     Chair/bed  transfer assist level: Contact Guard/Touching assist     Locomotion Ambulation   Ambulation assist      Assist level: Contact Guard/Touching assist Assistive device: Walker-rolling Max distance: 244 ft   Walk 10 feet activity   Assist     Assist level: Contact Guard/Touching assist Assistive device: Walker-rolling   Walk 50 feet activity   Assist    Assist level: Contact Guard/Touching assist Assistive device: Walker-rolling    Walk 150 feet activity   Assist    Assist level: Contact Guard/Touching assist Assistive device: Walker-rolling    Walk 10 feet on uneven surface  activity   Assist Walk 10 feet on uneven surfaces activity did not occur: Safety/medical concerns         Wheelchair     Assist Is the patient using a wheelchair?: Yes (transport only) Type of  Wheelchair: Manual    Wheelchair assist level: Dependent - Patient 0% Max wheelchair distance: 150    Wheelchair 50 feet with 2 turns activity    Assist        Assist Level: Dependent - Patient 0%   Wheelchair 150 feet activity     Assist      Assist Level: Dependent - Patient 0%   Blood pressure (!) 117/57, pulse 73, temperature 98.1 F (36.7 C), resp. rate 17, height 5\' 5"  (1.651 m), weight (!) 174.7 kg, last menstrual period 12/20/2017, SpO2 98%.  Medical Problem List and Plan: 1. Functional deficits secondary to debility related to acute on chronic diastolic congestive heart failure with exacerbation- diuresed significant weight             -patient may  shower             -ELOS/Goals: 5-7 days mod I- doesn't have help at home  -Continue CIR- con't CIR PT and OT 2.  Antithrombotics: -DVT/anticoagulation:  Pharmaceutical: Eliquis             -antiplatelet therapy: N/A 3. Pain Management: Voltaren gel twice daily, Lyrica 200 mg twice daily, hydrocodone as needed  2/21- pain usually controlled- con't regimen  -2/23 check C spine xray due to shooting pain reported down L arm. Addendum. Later in afternoon she reported shooting pain now down to medial hand. Advised avoid resting elbow on medial elbow over ulnar N coarse. Robaxin 500mg  PRN ordered.  4. Mood/Behavior/Sleep: Melatonin 10 mg nightly             -antipsychotic agents: N/A 5. Neuropsych/cognition: This patient is capable of making decisions on her own behalf. 6. Skin/Wound Care: Routine skin checks 7. Fluids/Electrolytes/Nutrition: Routine in and outs with follow-up chemistries 8.  PAF/A-fib with RVR.  Cardioversion 04/28/2023.  Cardiac rate controlled.  Toprol-XL 75 mg daily  2/21- sounds in regular rhythm- con't regimen  2/23 Heart rhythm sounds regular today, continue to monitor 9.  E. coli UTI.  Completed course of Rocephin 10.  Right thigh cellulitis.  Completed course of Rocephin 11.   Gastroenteritis secondary to norovirus/diarrhea.  Improving.  Supportive care..  Enteric precautions- no Sx's for 4 days, can stop if OK with Infection prevention  2/21- can stop since been 5 days since last Sx. Actually is constipated now  -2/23 LBM today. Pt with nausea today. Check abd xray. Will order 4mg  zofran- discussed with pharmacy(appreciate assistance) as pt has intermittent elevated QT. QTC not elevated on last EKG. Check labs and replete Mg for goal 2.0 ,K+ for goal above 4. If she needs more than one dose consider  EKG tomorrow.   Check orthostatic vital signs.  12.  Asthma.  Continue inhalers as directed. check oxygen saturations every shift 13.  OSA.  CPAP 14.  Acute on chronic CHF exacerbation.  Monitor for any signs of fluid overload.  Continue Bumex 1 mg twice daily, Aldactone 25 mg daily  2/21- weight up 4 kg- however bed was changed- will monitor trend- no LE edema  2/22 Will adjust Bumex timing to 3pm  -2/23 Wt a lower today, recheck renal panel  Filed Weights   05/01/23 0500 05/03/23 0500  Weight: (!) 178.8 kg (!) 174.7 kg     15.  Acute on chronic anemia.  Niferex daily.  Check CBC 16.  Diabetes mellitus.  Latest hemoglobin A1c 6.7.  Presently on SSI.  Patient on Ozempic prior to admission.  2/21- BG's 109-229- only 1 value over 200- will see if family cane bring in Ozempic  2/23 fair control CBGs, continue to monitor   CBG (last 3)  Recent Labs    05/02/23 2039 05/03/23 0628 05/03/23 1128  GLUCAP 173* 157* 188*    17.  Obesity.  BMI 62.77.  Dietary follow-up  2/21- weight 178.8 kg- up a lot form yesterday but has different bed- will monitor trend 18. Eczema- usually takes Dupixant- will see if can restart. Pt can bring in from home 19. Constipation- LBM 4 days ago- wants to wait on Sorbitol  2/21- will give Sorbitol Saturday if no BM- so far it's been 5 days- no Bariatric BSC in room.   2/22 reports large BM yesterday, continue to monitor bowel  function  2/23 LBM today 20. Preventative- will do vaseline for heels and change to regular bed per pt- see if can get Zyrtec from home to take since don't have here. 21. Yeast infection -2/22 fluconazole 150mg  1x  -2/23 symptoms improved today     LOS: 3 days A FACE TO FACE EVALUATION WAS PERFORMED  Fanny Dance 05/03/2023, 11:33 AM

## 2023-05-04 LAB — RENAL FUNCTION PANEL
Albumin: 3 g/dL — ABNORMAL LOW (ref 3.5–5.0)
Anion gap: 9 (ref 5–15)
BUN: 10 mg/dL (ref 6–20)
CO2: 28 mmol/L (ref 22–32)
Calcium: 8.9 mg/dL (ref 8.9–10.3)
Chloride: 97 mmol/L — ABNORMAL LOW (ref 98–111)
Creatinine, Ser: 0.96 mg/dL (ref 0.44–1.00)
GFR, Estimated: >60 mL/min (ref >60)
Glucose, Bld: 146 mg/dL — ABNORMAL HIGH (ref 70–99)
Phosphorus: 4.6 mg/dL (ref 2.5–4.6)
Potassium: 4.1 mmol/L (ref 3.5–5.1)
Sodium: 134 mmol/L — ABNORMAL LOW (ref 135–145)

## 2023-05-04 LAB — MAGNESIUM: Magnesium: 2 mg/dL (ref 1.7–2.4)

## 2023-05-04 LAB — GLUCOSE, CAPILLARY
Glucose-Capillary: 138 mg/dL — ABNORMAL HIGH (ref 70–99)
Glucose-Capillary: 129 mg/dL — ABNORMAL HIGH (ref 70–99)
Glucose-Capillary: 190 mg/dL — ABNORMAL HIGH (ref 70–99)
Glucose-Capillary: 160 mg/dL — ABNORMAL HIGH (ref 70–99)

## 2023-05-04 NOTE — Plan of Care (Signed)
  Problem: Consults Goal: RH GENERAL PATIENT EDUCATION Description: See Patient Education module for education specifics. Outcome: Progressing   Problem: RH BOWEL ELIMINATION Goal: RH STG MANAGE BOWEL WITH ASSISTANCE Description: STG Manage Bowel with Assistance. Outcome: Progressing   Problem: RH BLADDER ELIMINATION Goal: RH STG MANAGE BLADDER WITH ASSISTANCE Description: STG Manage Bladder With supervision Assistance Outcome: Progressing   Problem: RH SKIN INTEGRITY Goal: RH STG SKIN FREE OF INFECTION/BREAKDOWN Description:  Manage skin integrity, free from infection/ breakdown with supervision assistance   Outcome: Progressing   Problem: RH SAFETY Goal: RH STG ADHERE TO SAFETY PRECAUTIONS W/ASSISTANCE/DEVICE Description: STG Adhere to Safety Precautions With supervision  Assistance/Device. Outcome: Progressing   Problem: RH PAIN MANAGEMENT Goal: RH STG PAIN MANAGED AT OR BELOW PT'S PAIN GOAL Description: <4w/ prns Outcome: Progressing   Problem: RH KNOWLEDGE DEFICIT GENERAL Goal: RH STG INCREASE KNOWLEDGE OF SELF CARE AFTER HOSPITALIZATION Outcome: Progressing

## 2023-05-04 NOTE — Progress Notes (Signed)
 PROGRESS NOTE   Subjective/Complaints:  Pt reports L neck pain doing better with muscle relaxers as well as Lidoderm patch- 1 sticks ; 1 doesn't.  Patch helpful.  Nausea over weekend- Zofran x1- is better.  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  Also notes QT prolonged- over weekend.   ROS:   Pt denies SOB, abd pain, CP, N/V/C/D, and vision changes     Objective:   DG Cervical Spine Complete Result Date: 05/03/2023 CLINICAL DATA:  Neck pain. EXAM: CERVICAL SPINE - COMPLETE 4+ VIEW COMPARISON:  None available FINDINGS: Visualization of C1 through C6 on lateral view. No sagittal spondylolisthesis. Atlantodens interval is intact. Visualized vertebral body heights are maintained. Mild-to-moderate C5-6 disc space narrowing and anterior greater than posterior endplate osteophytes. C5-6 and C6-7 facet joint sclerosis and hypertrophy degenerative changes. The lateral masses of C1 are symmetrically aligned with the dens on open mouth odontoid view. No prevertebral soft tissue swelling is seen. IMPRESSION: 1. Mild-to-moderate C5-6 degenerative disc and endplate changes. 2. C5-6 and C6-7 facet joint degenerative changes. Electronically Signed   By: Neita Garnet M.D.   On: 05/03/2023 20:55   No results for input(s): "WBC", "HGB", "HCT", "PLT" in the last 72 hours.  Recent Labs    05/03/23 1256 05/04/23 0609  NA 137 134*  K 3.8 4.1  CL 95* 97*  CO2 31 28  GLUCOSE 160* 146*  BUN 10 10  CREATININE 0.80 0.96  CALCIUM 9.1 8.9    Intake/Output Summary (Last 24 hours) at 05/04/2023 0809 Last data filed at 05/04/2023 0500 Gross per 24 hour  Intake 1310 ml  Output --  Net 1310 ml        Physical Exam: Vital Signs Blood pressure 126/62, pulse 82, temperature 98.2 F (36.8 C), resp. rate 18, height 5\' 5"  (1.651 m),  weight (!) 172.6 kg, last menstrual period 12/20/2017, SpO2 91%.     General: awake, alert, appropriate, sitting up in bed;- regular bed; they cannot find bariatric bed; NAD HENT: conjugate gaze; oropharynx moist CV: regular rate and rhythm; no JVD Pulmonary: CTA B/L; no W/R/R- good air movement GI: soft, NT, ND, (+)BS- protuberant Psychiatric: appropriate- interactive Neurological: Ox3  Reports altered sensation medial eft hand Musculoskeletal:     Cervical back: Neck supple.     Moving all 4 extremities to gravity and resistance  TTP L periscapular muscles Tinels at elbow- equivocal Mild L trap pain with L shoulder abduction and ROM Improved somewhat- wearing lidoderm patch on posterior L shoulder  Skin:    General: Skin is warm.     Comments: no LE edema B/L Some eczema- not bad currently Heels dried/cracked    Assessment/Plan: 1. Functional deficits which require 3+ hours per day of interdisciplinary therapy in a comprehensive inpatient rehab setting. Physiatrist is providing close team supervision and 24 hour management of active medical problems listed below. Physiatrist and rehab team continue to assess barriers to discharge/monitor patient progress toward functional and medical goals  Care Tool:  Bathing    Body parts bathed  by patient: Right arm, Left arm, Chest, Abdomen, Front perineal area, Left upper leg, Right upper leg, Face   Body parts bathed by helper: Right lower leg, Left lower leg, Buttocks     Bathing assist Assist Level: Moderate Assistance - Patient 50 - 74%     Upper Body Dressing/Undressing Upper body dressing   What is the patient wearing?: Pull over shirt    Upper body assist Assist Level: Set up assist    Lower Body Dressing/Undressing Lower body dressing      What is the patient wearing?: Underwear/pull up, Pants     Lower body assist Assist for lower body dressing: Maximal Assistance - Patient 25 - 49%      Toileting Toileting    Toileting assist Assist for toileting: Contact Guard/Touching assist     Transfers Chair/bed transfer  Transfers assist     Chair/bed transfer assist level: Contact Guard/Touching assist     Locomotion Ambulation   Ambulation assist      Assist level: Contact Guard/Touching assist Assistive device: Walker-rolling Max distance: 244 ft   Walk 10 feet activity   Assist     Assist level: Contact Guard/Touching assist Assistive device: Walker-rolling   Walk 50 feet activity   Assist    Assist level: Contact Guard/Touching assist Assistive device: Walker-rolling    Walk 150 feet activity   Assist    Assist level: Contact Guard/Touching assist Assistive device: Walker-rolling    Walk 10 feet on uneven surface  activity   Assist Walk 10 feet on uneven surfaces activity did not occur: Safety/medical concerns         Wheelchair     Assist Is the patient using a wheelchair?: Yes (transport only) Type of Wheelchair: Manual    Wheelchair assist level: Dependent - Patient 0% Max wheelchair distance: 150    Wheelchair 50 feet with 2 turns activity    Assist        Assist Level: Dependent - Patient 0%   Wheelchair 150 feet activity     Assist      Assist Level: Dependent - Patient 0%   Blood pressure 126/62, pulse 82, temperature 98.2 F (36.8 C), resp. rate 18, height 5\' 5"  (1.651 m), weight (!) 172.6 kg, last menstrual period 12/20/2017, SpO2 91%.  Medical Problem List and Plan: 1. Functional deficits secondary to debility related to acute on chronic diastolic congestive heart failure with exacerbation- diuresed significant weight             -patient may  shower             -ELOS/Goals: 5-7 days mod I- doesn't have help at home  Con't CIR PT and OT 2.  Antithrombotics: -DVT/anticoagulation:  Pharmaceutical: Eliquis             -antiplatelet therapy: N/A 3. Pain Management: Voltaren gel twice  daily, Lyrica 200 mg twice daily, hydrocodone as needed  2/21- pain usually controlled- con't regimen  -2/23 check C spine xray due to shooting pain reported down L arm.  2/24- xray shows some degenerative changes, but nothing to explain L side specifically.   -her Robaxin extremely helpful 4. Mood/Behavior/Sleep: Melatonin 10 mg nightly             -antipsychotic agents: N/A 5. Neuropsych/cognition: This patient is capable of making decisions on her own behalf. 6. Skin/Wound Care: Routine skin checks 7. Fluids/Electrolytes/Nutrition: Routine in and outs with follow-up chemistries 8.  PAF/A-fib with RVR.  Cardioversion 04/28/2023.  Cardiac rate controlled.  Toprol-XL 75 mg daily  2/21- sounds in regular rhythm- con't regimen  2/23 Heart rhythm sounds regular today, continue to monitor 9.  E. coli UTI.  Completed course of Rocephin 10.  Right thigh cellulitis.  Completed course of Rocephin 11.  Gastroenteritis secondary to norovirus/diarrhea.  Improving.  Supportive care..  Enteric precautions- no Sx's for 4 days, can stop if OK with Infection prevention  2/21- can stop since been 5 days since last Sx. Actually is constipated now  -2/23 LBM today. Pt with nausea today.  Check abd xray. Will order 4mg  zofran- discussed with pharmacy(appreciate assistance) as pt has intermittent elevated QT. QTC not elevated on last EKG. Check labs and replete Mg for goal 2.0 ,K+ for goal above 4. If she needs more than one dose consider EKG tomorrow.   Check orthostatic vital signs.   2/24- no nausea- had regular Bms x2 yesterday 12.  Asthma.  Continue inhalers as directed. check oxygen saturations every shift 13.  OSA.  CPAP 14.  Acute on chronic CHF exacerbation.  Monitor for any signs of fluid overload.  Continue Bumex 1 mg twice daily, Aldactone 25 mg daily  2/21- weight up 4 kg- however bed was changed- will monitor trend- no LE edema  2/22 Will adjust Bumex timing to 3pm  -2/23 Wt a lower today, recheck  renal panel  2/24- Weight looking better- is still diuresing Filed Weights   05/01/23 0500 05/03/23 0500 05/04/23 0500  Weight: (!) 178.8 kg (!) 174.7 kg (!) 172.6 kg     15.  Acute on chronic anemia.  Niferex daily.  Check CBC 16.  Diabetes mellitus.  Latest hemoglobin A1c 6.7.  Presently on SSI.  Patient on Ozempic prior to admission.  2/21- BG's 109-229- only 1 value over 200- will see if family cane bring in Ozempic  2/23 fair control CBGs, continue to monitor   2/24- Fair control- family to bring in Ozempic.  CBG (last 3)  Recent Labs    05/03/23 1638 05/03/23 2044 05/04/23 0557  GLUCAP 183* 194* 138*    17.  Obesity.  BMI 62.77.  Dietary follow-up  2/21- weight 178.8 kg- up a lot form yesterday but has different bed- will monitor trend  2/24- weight back down- to 172.6 kg- which is weight she came over at.  18. Eczema- usually takes Dupixant- will see if can restart. Pt can bring in from home 19. Constipation- LBM 4 days ago- wants to wait on Sorbitol  2/21- will give Sorbitol Saturday if no BM- so far it's been 5 days- no Bariatric BSC in room.   2/22 reports large BM yesterday, continue to monitor bowel function  2/23 LBM today  2/24- 4 Bms over weekend- going well again.  20. Preventative- will do vaseline for heels and change to regular bed per pt- see if can get Zyrtec from home to take since don't have here. 21. Yeast infection -2/22 fluconazole 150mg  1x  -2/23 symptoms improved today   I spent a total of  35  minutes on total care today- >50% coordination of care- due to reviewing chart form weekend; labs, vitals and will d/w PA if supposed to be on FR 1500cc     LOS: 4 days A FACE TO FACE EVALUATION WAS PERFORMED  Mignonne Afonso 05/04/2023, 8:09 AM

## 2023-05-04 NOTE — Progress Notes (Signed)
   05/04/23 2058  BiPAP/CPAP/SIPAP  Reason BIPAP/CPAP not in use Other(comment) (patient CPAP at bedside, patient states the mask "freaks her out" refuses it tonight. She is asking for an RT during the day to come by and work with her with it. informed to the patient I will relay the message/pt request during the dayshift RT.)

## 2023-05-04 NOTE — Progress Notes (Signed)
 Occupational Therapy Session Note  Patient Details  Name: Alyssa Blanchard MRN: [id number] Date of Birth: September 24, 1970  Today's Date: 05/04/2023 OT Individual Time: 9528-4132 OT Individual Time Calculation (min): 55 min    Short Term Goals: Week 1:  OT Short Term Goal 1 (Week 1): Patient to perform toileting with Min assist and AE as needed OT Short Term Goal 2 (Week 1): Patient to perform shower transfer with Min assist OT Short Term Goal 3 (Week 1): Patient to perform LB dressing with AE set up assist   Skilled Therapeutic Interventions/Progress Updates:    Initial focus of session with pt performing bed mob Supervision, functional transfers with rollator SBA, 3/3 toileting with Supervision including placing powder, before walking to sink level for seated hand hygiene, grooming, shaving, etc seated. OT locating bariatric drop arm BSC for pt to use in future session for shower, pt performing shower transfer with CGA, placed against back of wall for better placement. Education on safe use of rollator as a seat. Pt in supine and side lying, OT attempting various soft tissue massage and ROM techniques for LUE, note extreme tenderness along rhomboid/scapular region with all pressure, alleviated with raising arm overhead in flexion - also attempted nerve glides for ulnar nerve but unable to tolerate this date 2/2 pain, which improved with rest, no movement and positional change. Left bed level call bell in reach all needs met.   Therapy Documentation Precautions:  Precautions Precautions: Fall Recall of Precautions/Restrictions: Intact Restrictions Weight Bearing Restrictions Per Provider Order: No    Therapy/Group: Individual Therapy  Erasmo Score 05/04/2023, 7:23 AM

## 2023-05-04 NOTE — Progress Notes (Signed)
 Occupational Therapy Note  Patient Details  Name: Alyssa Blanchard MRN: [id number] Date of Birth: 17-Feb-1971  Today's Date: 05/04/2023 OT Missed Time: 45 Minutes Missed Time Reason: MD hold (comment);Patient fatigue;Pain (PA holding for EKG d/t Lt chest/arm pain)  Upon arrival, pt reported to OT she had been waiting for nurse to arrive d/t increased arm/chest pain that woke her up from sleeping. Pt reported as sharp pain. OT notified nurse and nurse Joni Reining and PA Jesusita Oka arrived while OT taking vitals. Vitals all stable. PA reported hold therapy until EKG done. Will make up missed minutes as able.  Velia Meyer, OTD, OTR/L 05/04/2023, 4:11 PM

## 2023-05-04 NOTE — Evaluation (Signed)
 Recreational Therapy Assessment and Plan  Patient Details  Name: Alyssa Blanchard MRN: [id number] Date of Birth: 1970/07/11 Today's Date: 05/04/2023  Rehab Potential:  Good ELOS:   7 days  Assessment Hospital Problem: Principal Problem:   Debility Active Problems:   Acute on chronic diastolic heart failure (HCC)     Past Medical History:      Past Medical History:  Diagnosis Date   Anxiety     Asthma     Atrial fibrillation (HCC)     Depression     DM (diabetes mellitus), type 2 (HCC) 02/16/2022   Dysrhythmia      new onset Afib rvr   GERD (gastroesophageal reflux disease)     Heart failure (HCC)     Heel spur      bilat   History of bronchitis     History of chicken pox     History of urinary tract infection     Hypertension     Migraines     Plantar fasciitis      bilat   STD (sexually transmitted disease)      chl hx & hsv 1&2   Tremors of nervous system     Urinary incontinence          Past Surgical History:       Past Surgical History:  Procedure Laterality Date   BIOPSY   10/14/2022    Procedure: BIOPSY;  Surgeon: Jenel Lucks, MD;  Location: [facility];  Service: Gastroenterology;;   CARDIOVERSION N/A 08/05/2019    Procedure: CARDIOVERSION;  Surgeon: Vesta Mixer, MD;  Location: [facility]OSCOPY;  Service: Cardiovascular;  Laterality: N/A;   CARDIOVERSION N/A 04/28/2023    Procedure: CARDIOVERSION;  Surgeon: Jake Bathe, MD;  Location: [facility]ASIVE CV LAB;  Service: Cardiovascular;  Laterality: N/A;   COLONOSCOPY WITH PROPOFOL N/A 10/14/2022    Procedure: COLONOSCOPY WITH PROPOFOL;  Surgeon: Jenel Lucks, MD;  Location: [facility] ENDOSCOPY;  Service: Gastroenterology;  Laterality: N/A;   ESOPHAGOGASTRODUODENOSCOPY (EGD) WITH PROPOFOL N/A 10/14/2022    Procedure: ESOPHAGOGASTRODUODENOSCOPY (EGD) WITH PROPOFOL;  Surgeon: Jenel Lucks, MD;  Location: [facility] ENDOSCOPY;  Service: Gastroenterology;  Laterality: N/A;   LAPAROSCOPIC GASTRIC SLEEVE  RESECTION N/A 11/27/2014    Procedure: LAPAROSCOPIC GASTRIC SLEEVE RESECTION WITH HIATAL HERNIA REPAIR UPPER ENDOSCOPY;  Surgeon: Luretha Murphy, MD;  Location: [facility] ORS;  Service: General;  Laterality: N/A;   POLYPECTOMY   10/14/2022    Procedure: POLYPECTOMY;  Surgeon: Jenel Lucks, MD;  Location: [facility] ENDOSCOPY;  Service: Gastroenterology;;   TEE WITHOUT CARDIOVERSION N/A 08/05/2019    Procedure: TRANSESOPHAGEAL ECHOCARDIOGRAM (TEE);  Surgeon: Vesta Mixer, MD;  Location: [facility] ENDOSCOPY;  Service: Cardiovascular;  Laterality: N/A;   TONSILLECTOMY   1978          Assessment & Plan Clinical Impression: Alyssa Blanchard is a 53 year old right-handed female well-known to inpatient rehab services with admission 02/28/2022 - 03/11/2022 for debility/morbid obesity with BMI 62.77/right lower extremity cellulitis as well as history of chronic diastolic congestive heart failure, anemia, PAF on Eliquis, asthma, type 2 diabetes mellitus, OSA with CPAP, hypertension, depression/anxiety, quit smoking 11 years ago. Recent admission at [facility] 03/10/18/2025 - 03/17/2023 for acute on chronic congestive heart failure exacerbation and was diuresed aggressively. Weight was 206 kg on admission at that time down to 179 kg on discharge. Per chart review patient lives at home with his mother who is available 24/7. Mother is able to do light housework. Patient  independent with a cane and driving prior to admission. 5 steps to entry of home. Presented 04/23/2023 with increasing shortness of breath with progressive fluid buildup lower extremities x 1 week. Chest x-ray showed mild enlargement of the cardiopericardial silhouette with indistinct pulmonary vascular could not exclude pulmonary venous hypertension. No overt edema. Admission chemistries unremarkable except hemoglobin 9.3, BNP 66.4, troponin negative, blood cultures no growth to date. Echocardiogram ejection fraction of 70 to 75%. Left ventricle hyperdynamic function. No  evidence of mitral stenosis. Hospital course complicated by A-fib with RVR follow-up per cardiology services started on diltiazem and amiodarone for control. Patient did require electrical cardioversion 04/28/2023 per Dr. [name]. She presently remains on Eliquis. Patient she spiked a fever on 04/25/2023 with nausea and emesis. GI pathogen panel positive for norovirus. Blood cultures remain no growth to date, urine culture greater 100,000 E. coli.. Intravenous Rocephin initiated and completed. Right thigh cellulitis and again patient continued on Rocephin until cpmpletion . Therapy evaluations completed due to patient decreased functional mobility/debility was admitted for a comprehensive rehab program.  Patient transferred to CIR on 04/30/2023.    Pt presents with decreased activity tolerance, decreased functional mobility, decreased balance, decreased oxygen support, feelings of stress Limiting pt's independence with leisure/community pursuits.  Met with pt today to discuss TR services including leisure education, activity analysis/modifications and stress management.  Also discussed the importance of social, emotional, spiritual health in addition to physical health and their effects on overall health and wellness.  Pt stated understanding.    Plan  Min 1 TR session >20 minutes during LOS  Recommendations for other services: None   Discharge Criteria: Patient will be discharged from TR if patient refuses treatment 3 consecutive times without medical reason.  If treatment goals not met, if there is a change in medical status, if patient makes no progress towards goals or if patient is discharged from hospital.  The above assessment, treatment plan, treatment alternatives and goals were discussed and mutually agreed upon: by patient  [name] 05/04/2023, 3:28 PM

## 2023-05-04 NOTE — Plan of Care (Signed)
  Problem: Consults Goal: RH GENERAL PATIENT EDUCATION Description: See Patient Education module for education specifics. Outcome: Progressing   Problem: RH BOWEL ELIMINATION Goal: RH STG MANAGE BOWEL WITH ASSISTANCE Description: STG Manage Bowel with Assistance. Outcome: Progressing   Problem: RH BLADDER ELIMINATION Goal: RH STG MANAGE BLADDER WITH ASSISTANCE Description: STG Manage Bladder With supervision Assistance Outcome: Progressing   Problem: RH SKIN INTEGRITY Goal: RH STG SKIN FREE OF INFECTION/BREAKDOWN Description:  Manage skin integrity, free from infection/ breakdown with supervision assistance   Outcome: Progressing   Problem: RH PAIN MANAGEMENT Goal: RH STG PAIN MANAGED AT OR BELOW PT'S PAIN GOAL Description: <4w/ prns Outcome: Progressing

## 2023-05-04 NOTE — Progress Notes (Signed)
 Physical Therapy Session Note  Patient Details  Name: Alyssa Blanchard MRN: [id number] Date of Birth: 1970/12/13  Today's Date: 05/04/2023 PT Individual Time: 0906-1004 + 1305-1400 PT Individual Time Calculation (min): 58 min + 55 min  Short Term Goals: Week 1:  PT Short Term Goal 1 (Week 1): =LTGs d/t ELOS  Skilled Therapeutic Interventions/Progress Updates:    SESSION 1: Pt presents in room in bed, states she feels better than yesterday and has received pain medication and muscle relaxer for shoulder pain which has helped. Session focused on therapeutic activities to facilitate participation with self care tasks and gait training for increasing tolerance to upright as well as use of ambulation device.  Therapist dons BLE ace wraps to provide compression needed for BP support. Pt requests to use restroom, completes bed mobility supervision approaching modI, completes sit>stand without device supervision and ambulates to toilet with supervision without device. Pt reports feeling increased unsteadiness without device and requests use of rollator following toilet transfer. Pt completes 3/3 toileting tasks with supervision, then ambulates with rollator to sink to complete hand hygiene with supervision. Pt returns to sitting on EOB to complete upper and lower body dressing with supervision, requires total assist for donning socks/shoes.  Pt then ambulates with rollator 50', 110', and 150' with pt stopping to sit in rollator with min cues for rollator parts management, ambulates with supervision. Pt requires seated rest breaks for energy conservation. Upon last walk pt ambulates to sit on mat in day room. Pt completes stand to scale, weight charted, and pt completes transfer to [facility] for transport back to room.  Pt returns to room dependently via WC, and completes transfer back to bed with supervision where she remains with all needs within reach, cal light in place and at end of session. Therapist  provides heat pack for neck at end of session to decrease muscular pain.   SESSION 2: Pt presents in room seated EOB with pt mother present, pt reporting pain in L shoulder but has recently received muscle relaxer which has helped. Session focused on therapeutic activities to facilitate participation with self care tasks, gait training, and therapeutic exercise.  Pt completes doffing sock while seated EOB with supervision only, therapist dons socks and shoes total assist for time management. Pt then completes stand to rollator with suprevision and ambulates ~100' from room to main gym to sit on EOM.  Pt completes standing therex with BUE support on RW to promote BLE strengthening, activity tolerance, and muscle fiber recruitment needed for functional transfers including: Marching no UE support x20 alternating BLE Step taps 4" step x20 alternating BLE Heel raise x20 Hamstring curl x20 Mini squats holding 6# with BUE x10 Sit<>stand holding 6# weight with BUE x10  Pt provided with seated rest breaks between all gait trials and exercises to promote energy conservation and quality with tasks.  Pt returns to room ambulating with rollator and remains supine in bed with all needs within reach, cal light in place  at end of session. Therapist provides heat pack for neck at end of session.     Therapy Documentation Precautions:  Precautions Precautions: Fall Recall of Precautions/Restrictions: Intact Restrictions Weight Bearing Restrictions Per Provider Order: No    Therapy/Group: Individual Therapy  Edwin Cap PT, DPT 05/04/2023, 1:03 PM

## 2023-05-05 LAB — CBC
HCT: 35.4 % — ABNORMAL LOW (ref 36.0–46.0)
Hemoglobin: 10.4 g/dL — ABNORMAL LOW (ref 12.0–15.0)
MCH: 25.6 pg — ABNORMAL LOW (ref 26.0–34.0)
MCHC: 29.4 g/dL — ABNORMAL LOW (ref 30.0–36.0)
MCV: 87 fL (ref 80.0–100.0)
Platelets: 201 10*3/uL (ref 150–400)
RBC: 4.07 MIL/uL (ref 3.87–5.11)
RDW: 17.4 % — ABNORMAL HIGH (ref 11.5–15.5)
WBC: 7.9 10*3/uL (ref 4.0–10.5)
nRBC: 0 % (ref 0.0–0.2)

## 2023-05-05 LAB — GLUCOSE, CAPILLARY
Glucose-Capillary: 131 mg/dL — ABNORMAL HIGH (ref 70–99)
Glucose-Capillary: 142 mg/dL — ABNORMAL HIGH (ref 70–99)
Glucose-Capillary: 154 mg/dL — ABNORMAL HIGH (ref 70–99)
Glucose-Capillary: 166 mg/dL — ABNORMAL HIGH (ref 70–99)

## 2023-05-05 MED ORDER — HYDROCORTISONE ACETATE 25 MG RE SUPP: 25.0000 mg | Freq: Two times a day (BID) | RECTAL | Status: DC

## 2023-05-05 MED ORDER — HYDROCORTISONE ACETATE 25 MG RE SUPP
25.0000 mg | Freq: Two times a day (BID) | RECTAL | Status: AC
  Administered 2023-05-05: 25 mg via RECTAL
  Filled 2023-05-05 (×6): qty 1

## 2023-05-05 MED ORDER — BUMETANIDE 1 MG PO TABS
1.0000 mg | ORAL_TABLET | Freq: Once | ORAL | Status: AC
  Administered 2023-05-05: 1 mg via ORAL
  Filled 2023-05-05: qty 1

## 2023-05-05 NOTE — Progress Notes (Signed)
 Occupational Therapy Session Note  Patient Details  Name: Alyssa Blanchard MRN: [id number] Date of Birth: 12-07-1970  Today's Date: 05/05/2023 OT Individual Time: 1005-1120 OT Individual Time Calculation (min): 75 min    Short Term Goals: Week 1:  OT Short Term Goal 1 (Week 1): Patient to perform toileting with Min assist and AE as needed OT Short Term Goal 2 (Week 1): Patient to perform shower transfer with Min assist OT Short Term Goal 3 (Week 1): Patient to perform LB dressing with AE set up assist  Skilled Therapeutic Interventions/Progress Updates:    Pt greeted semi-reclined in bed and agreeable to OT treatment session. Pt completed bed mobility with HOB elevated and supervision.  OT Ace wrapped B LE's for edema management. OT reviewed use of sock-aid to don socks with pt demonstrating understanding. She then needed OT assist to don shoes. Pt ambulated to therapy gym with rollator and supervision with intermitent standing rest breaks. Pt reported scapular pain running down to her ulnar nerve. OT applied kinesiotape for pain management with improved pain. Continued working on functional endurance with standing activity, then ambulated to day room with seated rest break before returning to room. Pt left semi-reclined in bed with bed alarm on, call bell in reach, and needs met.   Therapy Documentation Precautions:  Precautions Precautions: Fall Recall of Precautions/Restrictions: Intact Restrictions Weight Bearing Restrictions Per Provider Order: No Pain: Pain Assessment Pain Score: 6  Scapular pain Rest, repositioned  Therapy/Group: Individual Therapy  Mal Amabile 05/05/2023, 11:28 AM

## 2023-05-05 NOTE — Progress Notes (Signed)
 Physical Therapy Session Note  Patient Details  Name: Alyssa Blanchard MRN: [id number] Date of Birth: 1970/12/28  Today's Date: 05/05/2023 PT Individual Time: 4098-1191 PT Individual Time Calculation (min): 42 min   Short Term Goals: Week 1:  PT Short Term Goal 1 (Week 1): =LTGs d/t ELOS  Skilled Therapeutic Interventions/Progress Updates:     Pt received supine in bed and agrees to therapy. Reports pain in LUE. PT provides rest breaks and alerts RN to pain, who provides pt with muscle relaxer at initiation of session. Pt performs bed mobility with bed features and cues for sequencing. Pt performs ambulatory transfer to toilet with rollator and cues for safe AD management. Following, pt transfers onto new bariatric recliner delivered to her room. Bariatric chair sits lower than  normal chair and pt practices performing sit to stand for transfer training and sequencing. WC transport outside for time management. Pt perform gait training outdoors for ambulation over unlevel and varying surfaces. Pt ambulates x200' and x300' with cues for upright gaze to improve posture and balance, monitoring level of exertion for safety awareness, and accounting for grades in sidewalk to promote improved safety. WC transport back to room. Left seated in recliner with all needs within reach.   Therapy Documentation Precautions:  Precautions Precautions: Fall Recall of Precautions/Restrictions: Intact Restrictions Weight Bearing Restrictions Per Provider Order: No    Therapy/Group: Individual Therapy  Beau Fanny, PT, DPT 05/05/2023, 3:57 PM

## 2023-05-05 NOTE — Progress Notes (Signed)
 Patient ID: Alyssa Blanchard, female   DOB: 12-05-1970, 53 y.o.   MRN: [id number]  Met with pt to give her team conference update regarding goals of mod/I level and target discharge date of 3/3. Have re-ordered a hospital bed since her's is broken at home and [employer] reports needs a new order for it to be replaced. Pt will look for a rollator online since it is private pay since she got a regular rolling walker two years ago. She is happy to be able to be here to get stronger and work on her endurance. Will work on discharge needs.

## 2023-05-05 NOTE — Plan of Care (Signed)
  Problem: Consults Goal: RH GENERAL PATIENT EDUCATION Description: See Patient Education module for education specifics. Outcome: Progressing   Problem: RH BOWEL ELIMINATION Goal: RH STG MANAGE BOWEL WITH ASSISTANCE Description: STG Manage Bowel with Assistance. Outcome: Progressing   Problem: RH BLADDER ELIMINATION Goal: RH STG MANAGE BLADDER WITH ASSISTANCE Description: STG Manage Bladder With supervision Assistance Outcome: Progressing   Problem: RH SKIN INTEGRITY Goal: RH STG SKIN FREE OF INFECTION/BREAKDOWN Description:  Manage skin integrity, free from infection/ breakdown with supervision assistance   Outcome: Progressing   Problem: RH SAFETY Goal: RH STG ADHERE TO SAFETY PRECAUTIONS W/ASSISTANCE/DEVICE Description: STG Adhere to Safety Precautions With supervision  Assistance/Device. Outcome: Progressing   Problem: RH PAIN MANAGEMENT Goal: RH STG PAIN MANAGED AT OR BELOW PT'S PAIN GOAL Description: <4w/ prns Outcome: Progressing   Problem: RH KNOWLEDGE DEFICIT GENERAL Goal: RH STG INCREASE KNOWLEDGE OF SELF CARE AFTER HOSPITALIZATION Outcome: Progressing

## 2023-05-05 NOTE — Progress Notes (Signed)
 The patient attempted a bowel movement with no success. Presence of frank blood noted in toilet. Provider notified. New orders given. Will continue to monitor

## 2023-05-05 NOTE — Progress Notes (Signed)
 Occupational Therapy Session Note  Patient Details  Name: Alyssa Blanchard MRN: [id number] Date of Birth: 1970/06/29  Today's Date: 05/05/2023 OT Individual Time: [phone] OT Individual Time Calculation (min): 88 min    Short Term Goals: Week 1:  OT Short Term Goal 1 (Week 1): Patient to perform toileting with Min assist and AE as needed OT Short Term Goal 2 (Week 1): Patient to perform shower transfer with Min assist OT Short Term Goal 3 (Week 1): Patient to perform LB dressing with AE set up assist  Skilled Therapeutic Interventions/Progress Updates:      Therapy Documentation Precautions:  Precautions Precautions: Fall Recall of Precautions/Restrictions: Intact Restrictions Weight Bearing Restrictions Per Provider Order: No General: "I am, ready for this shower" Pt supine in bed upon OT arrival, agreeable to OT session.  Pain:  unrated pain reported in Lt shoulder, pt medicated at end of session, shower, activity, intermittent rest breaks, distractions provided for pain management, pt reports tolerable to proceed.    ADL: Pt completed full ADL this AM for increased endurance and generalized strength while practicing energy conservation techniques. Pt completed the following activities with levels of assist listed and rest breaks as necessary: Bed mobility: SBA from supine>EOB with HOB slightly raised Grooming/oral hygiene: distant supervision seated in W/C for shaving and applying light makeup, able to manipulate containers Toilet transfer: SBA with rollator ambulating ~10 ft from bed with no LOB/SOB Toileting: SBA with standard toilet, voided urine and able to complete pants management and hygiene UB dressing: SBA donning/doffing seated EOB with good trunk balance LB dressing: SBA, also able to reach to floor while seated to retrieve pants off of floor to hand to OT Shower transfer: SBA with rollator ambulating from toilet  Bathing: SBA seated on bariatric BSC, issued long  handled sponge, able to complete hair care with Lt shoulder despite pain Transfers: Pt ambulated slightly around room with no AD at SBA in order to retrieve shower items, no LOB/SOB  Pt requiring frequent rest breaks d/t fatigue during ADL session, energy conservation strategies re-iterated during session.   Pt supine in bed with bed alarm activated, 2 bed rails up, call light within reach and 4Ps assessed. Direct Handoff to primary nurse.   Therapy/Group: Individual Therapy  Velia Meyer, OTD, OTR/L 05/05/2023, 12:39 PM

## 2023-05-05 NOTE — Patient Care Conference (Signed)
 Inpatient RehabilitationTeam Conference and Plan of Care Update Date: 05/05/2023   Time: 1132 am    Patient Name: Alyssa Blanchard [facility]      Medical Record Number: [id number]  Date of Birth: [birth year] Sex: Female         Room/Bed: 4M12C/4M12C-01 Payor Info: Payor: [place] MEDICAID PREPAID HEALTH PLAN / Plan: [place] MEDICAID [employer] / Product Type: *No Product type* /    Admit Date/Time:  04/30/2023  5:44 PM  Primary Diagnosis:  Debility  Hospital Problems: Principal Problem:   Debility Active Problems:   Acute on chronic diastolic heart failure (HCC)   Type 2 diabetes mellitus with hyperglycemia, without long-term current use of insulin (HCC)   Acute on chronic systolic CHF (congestive heart failure) (HCC)   Acute on chronic congestive heart failure [facility])    Expected Discharge Date: Expected Discharge Date: 05/11/23  Team Members Present: Physician leading conference: Dr. Genice Rouge Social Worker Present: Dossie Der, LCSW Nurse Present: Konrad Dolores, RN PT Present: Bernie Covey, PT OT Present: Velia Meyer, OT     Current Status/Progress Goal Weekly Team Focus  Bowel/Bladder   pt continent of b/b   Remain continent   Assist with toileting needs qshift and prn    Swallow/Nutrition/ Hydration               ADL's   SBA overall, severly limited by endurance and Lt shoulder pain   mod I   activity tolerance, energy conservation, pain management, UB strength/conditioning    Mobility   supervision overall, supervision bed mobility approaching modI, supervision short distance ambulation   mod I for all mobiltiy, gait 325 ft  endurance, cardiovascular support, BLE strength    Communication                Safety/Cognition/ Behavioral Observations               Pain   pt c/o left shoulder pain. 8/10 pain score, prn pain meds and muscle relaxers given   <3 pain score   Assess qshift and prn    Skin   Skin intact   Remain intact  Assess  qshift and prn      Discharge Planning:  HOme with mom who can be there but has declined with her health and can not assist pt. Pt will need to be mod/i to be safe at home at DC. Has DME have gotten HH agency to accept her medicaid   Team Discussion: Patient admitted with debility related to acute to chronic diastolic congestive heart failure with exacerbation. New Atrial Fibrillation : rate controlled with medications post cardioversion. Patient limited by weight gain, poor endurance, left shoulder pain, and fatigue.  Patient on target to meet rehab goals: yes, Patient requires supervision with ADLs;Patient requires supervision with short distances ambulation using RW. Overall goals for discharge are set for Mod I.  *See Care Plan and progress notes for long and short-term goals.   Revisions to Treatment Plan:  K tape on left shoulder Energy conservation techniques   Teaching Needs: Safety, dietary modifications, medications, toileting, transfers, energy conservation,ZONE tool, etc.  Current Barriers to Discharge: Decreased caregiver support and Weight  Possible Resolutions to Barriers: Family education Home health follow up DME: [employer] rollator, hospital bed     Medical Summary Current Status: BMI- 64.35; inconitninent of bladder- using purewick at home; ulcer diabetic- scabbed R thigh- fold behind knee- L shoulder pain- taking 2 norco every 6 hours  Barriers to Discharge: Weight  bearing restrictions;Behavior/Mood;Complicated Wound;Medical stability;Morbid Obesity;Self-care education;Incontinence  Barriers to Discharge Comments: L shoulder pain; bladder incontinent- using purewick- will stop BMI almost 65- will give Bumex extra dose-severelyt limited by  endurance/fatigue; needs to be mod I Possible Resolutions to [employer] Focus: stop purewick- has BSC in room now; Ktaping of L shoulder- will do trp injections Thursday if need be; d/c 3/3   Continued Need for Acute  Rehabilitation Level of Care: The patient requires daily medical management by a physician with specialized training in physical medicine and rehabilitation for the following reasons: Direction of a multidisciplinary physical rehabilitation program to maximize functional independence : Yes Medical management of patient stability for increased activity during participation in an intensive rehabilitation regime.: Yes Analysis of laboratory values and/or radiology reports with any subsequent need for medication adjustment and/or medical intervention. : Yes   I attest that I was present, lead the team conference, and concur with the assessment and plan of the team.   Gwenyth Allegra 05/05/2023, 7:56 PM

## 2023-05-05 NOTE — Progress Notes (Signed)
 PROGRESS NOTE   Subjective/Complaints:  Pt reports feeling like retaining mor fluid- in hands and feet-  LBM 2 days ago.  Had episode yesterday where woke up out of sleep with heart and L chest pain- they did EKG- which was normal and followed BP- which stayed well controlled- it went away spontaneously and she hasn't had recur.    Got weighed on Standing scale- will try to do today. Came back as 175 kg/385.81 and 386.69- however is actually better than all but 1 day- yesterday weighed twice- 380 and 389 depending on scale.    ROS:    Pt denies SOB, abd pain, CP, N/V/C/D, and vision changes  Feels swollen (+)    Objective:   DG Cervical Spine Complete Result Date: 05/03/2023 CLINICAL DATA:  Neck pain. EXAM: CERVICAL SPINE - COMPLETE 4+ VIEW COMPARISON:  None available FINDINGS: Visualization of C1 through C6 on lateral view. No sagittal spondylolisthesis. Atlantodens interval is intact. Visualized vertebral body heights are maintained. Mild-to-moderate C5-6 disc space narrowing and anterior greater than posterior endplate osteophytes. C5-6 and C6-7 facet joint sclerosis and hypertrophy degenerative changes. The lateral masses of C1 are symmetrically aligned with the dens on open mouth odontoid view. No prevertebral soft tissue swelling is seen. IMPRESSION: 1. Mild-to-moderate C5-6 degenerative disc and endplate changes. 2. C5-6 and C6-7 facet joint degenerative changes. Electronically Signed   By: Neita Garnet M.D.   On: 05/03/2023 20:55   No results for input(s): "WBC", "HGB", "HCT", "PLT" in the last 72 hours.  Recent Labs    05/03/23 1256 05/04/23 0609  NA 137 134*  K 3.8 4.1  CL 95* 97*  CO2 31 28  GLUCOSE 160* 146*  BUN 10 10  CREATININE 0.80 0.96  CALCIUM 9.1 8.9    Intake/Output Summary (Last 24 hours) at 05/05/2023 0819 Last data filed at 05/05/2023 0735 Gross per 24 hour  Intake 1274 ml  Output --  Net  1274 ml        Physical Exam: Vital Signs Blood pressure 119/61, pulse 84, temperature 98.2 F (36.8 C), resp. rate 18, height 5\' 5"  (1.651 m), weight (!) 175.4 kg, last menstrual period 12/20/2017, SpO2 94%.      General: awake, alert, appropriate, sitting up slightly in regular bed; NAD HENT: conjugate gaze; oropharynx moist CV: regular rate and rhythm; no JVD Pulmonary: CTA B/L; no W/R/R- good air movement GI: soft, NT, ND, (+)BS Psychiatric: appropriate- pleasant Neurological: Ox3 Extremities:  trace> swelling In hands and feet, no other location I could find.   Reports altered sensation medial left hand Musculoskeletal:     Cervical back: Neck supple.     Moving all 4 extremities to gravity and resistance  TTP L periscapular muscles Tinels at elbow- equivocal Mild L trap pain with L shoulder abduction and ROM Improved somewhat- wearing lidoderm patch on posterior L shoulder  Skin:    General: Skin is warm.     Comments: no LE edema B/L Some eczema- not bad currently Heels dried/cracked    Assessment/Plan: 1. Functional deficits which require 3+ hours per day of interdisciplinary therapy in a comprehensive inpatient rehab setting. Physiatrist is providing close team supervision  and 24 hour management of active medical problems listed below. Physiatrist and rehab team continue to assess barriers to discharge/monitor patient progress toward functional and medical goals  Care Tool:  Bathing    Body parts bathed by patient: Right arm, Left arm, Chest, Abdomen, Front perineal area, Left upper leg, Right upper leg, Face   Body parts bathed by helper: Right lower leg, Left lower leg, Buttocks     Bathing assist Assist Level: Moderate Assistance - Patient 50 - 74%     Upper Body Dressing/Undressing Upper body dressing   What is the patient wearing?: Pull over shirt    Upper body assist Assist Level: Set up assist    Lower Body Dressing/Undressing Lower body  dressing      What is the patient wearing?: Underwear/pull up, Pants     Lower body assist Assist for lower body dressing: Maximal Assistance - Patient 25 - 49%     Toileting Toileting    Toileting assist Assist for toileting: Contact Guard/Touching assist     Transfers Chair/bed transfer  Transfers assist     Chair/bed transfer assist level: Contact Guard/Touching assist     Locomotion Ambulation   Ambulation assist      Assist level: Contact Guard/Touching assist Assistive device: Walker-rolling Max distance: 244 ft   Walk 10 feet activity   Assist     Assist level: Contact Guard/Touching assist Assistive device: Walker-rolling   Walk 50 feet activity   Assist    Assist level: Contact Guard/Touching assist Assistive device: Walker-rolling    Walk 150 feet activity   Assist    Assist level: Contact Guard/Touching assist Assistive device: Walker-rolling    Walk 10 feet on uneven surface  activity   Assist Walk 10 feet on uneven surfaces activity did not occur: Safety/medical concerns         Wheelchair     Assist Is the patient using a wheelchair?: Yes (transport only) Type of Wheelchair: Manual    Wheelchair assist level: Dependent - Patient 0% Max wheelchair distance: 150    Wheelchair 50 feet with 2 turns activity    Assist        Assist Level: Dependent - Patient 0%   Wheelchair 150 feet activity     Assist      Assist Level: Dependent - Patient 0%   Blood pressure 119/61, pulse 84, temperature 98.2 F (36.8 C), resp. rate 18, height 5\' 5"  (1.651 m), weight (!) 175.4 kg, last menstrual period 12/20/2017, SpO2 94%.  Medical Problem List and Plan: 1. Functional deficits secondary to debility related to acute on chronic diastolic congestive heart failure with exacerbation- diuresed significant weight             -patient may  shower             -ELOS/Goals: 5-7 days mod I- doesn't have help at  home  Con't CIR PT and OT  Team conference with entire team- to determine length of stay 2.  Antithrombotics: -DVT/anticoagulation:  Pharmaceutical: Eliquis             -antiplatelet therapy: N/A 3. Pain Management: Voltaren gel twice daily, Lyrica 200 mg twice daily, hydrocodone as needed  2/21- pain usually controlled- con't regimen  -2/23 check C spine xray due to shooting pain reported down L arm.  2/24- xray shows some degenerative changes, but nothing to explain L side specifically.   -her Robaxin extremely helpful 4. Mood/Behavior/Sleep: Melatonin 10 mg nightly             -  antipsychotic agents: N/A 5. Neuropsych/cognition: This patient is capable of making decisions on her own behalf. 6. Skin/Wound Care: Routine skin checks 7. Fluids/Electrolytes/Nutrition: Routine in and outs with follow-up chemistries 8.  PAF/A-fib with RVR.  Cardioversion 04/28/2023.  Cardiac rate controlled.  Toprol-XL 75 mg daily  2/25- sounds regular- con't regimen 9.  E. coli UTI.  Completed course of Rocephin 10.  Right thigh cellulitis.  Completed course of Rocephin 11.  Gastroenteritis secondary to norovirus/diarrhea.  Improving.  Supportive care..  Enteric precautions- no Sx's for 4 days, can stop if OK with Infection prevention  2/21- can stop since been 5 days since last Sx. Actually is constipated now  -2/23 LBM today. Pt with nausea today.  Check abd xray. Will order 4mg  zofran- discussed with pharmacy(appreciate assistance) as pt has intermittent elevated QT. QTC not elevated on last EKG. Check labs and replete Mg for goal 2.0 ,K+ for goal above 4. If she needs more than one dose consider EKG tomorrow.   Check orthostatic vital signs.   2/24- no nausea- had regular Bms x2 yesterday 12.  Asthma.  Continue inhalers as directed. check oxygen saturations every shift 13.  OSA.  CPAP 14.  Acute on chronic CHF exacerbation.  Monitor for any signs of fluid overload.  Continue Bumex 1 mg twice daily,  Aldactone 25 mg daily  2/21- weight up 4 kg- however bed was changed- will monitor trend- no LE edema  2/22 Will adjust Bumex timing to 3pm  -2/23 Wt a lower today, recheck renal panel  2/24- Weight looking better- is still diuresing Filed Weights   05/04/23 1300 05/05/23 0500 05/05/23 0800  Weight: (!) 176.4 kg (!) 175 kg (!) 175.4 kg    2/25- Weight about the same, but pt feels more swollen- in hands and feet- will try 1 mg Bumex in addition to 1 mg BID  15.  Acute on chronic anemia.  Niferex daily.  Check CBC 16.  Diabetes mellitus.  Latest hemoglobin A1c 6.7.  Presently on SSI.  Patient on Ozempic prior to admission.  2/21- BG's 109-229- only 1 value over 200- will see if family cane bring in Ozempic  2/23 fair control CBGs, continue to monitor   2/24-2/25 Fair control- family to bring in Ozempic.  CBG (last 3)  Recent Labs    05/04/23 1650 05/04/23 2033 05/05/23 0611  GLUCAP 190* 160* 131*    17.  Obesity.  BMI 62.77.  Dietary follow-up  2/21- weight 178.8 kg- up a lot form yesterday but has different bed- will monitor trend  2/24- weight back down- to 172.6 kg- which is weight she came over at.   2/25- Weight up 5 lbs since low of what she came over at- will do 1 mg Bumex x1 18. Eczema- usually takes Dupixant- will see if can restart. Pt can bring in from home 19. Constipation- LBM 4 days ago- wants to wait on Sorbitol  2/21- will give Sorbitol Saturday if no BM- so far it's been 5 days- no Bariatric BSC in room.   2/22 reports large BM yesterday, continue to monitor bowel function  2/23 LBM today  2/24- 4 Bms over weekend- going well again.   2/25- LBM 2 days ago- will monitor 20. Preventative- will do vaseline for heels and change to regular bed per pt- see if can get Zyrtec from home to take since don't have here. 21. Yeast infection -2/22 fluconazole 150mg  1x  -2/23 symptoms improved today   I spent a  total of  36  minutes on total care today- >50% coordination of  care- due to review of weights and went back for 10 days- added Bumex- and team conference to determine length of stay      LOS: 5 days A FACE TO FACE EVALUATION WAS PERFORMED  Alyssa Blanchard 05/05/2023, 8:19 AM

## 2023-05-05 NOTE — Consult Note (Signed)
 Neuropsychological Consultation Comprehensive Inpatient Rehab   Patient:   Alyssa Blanchard   DOB:   1970/08/30  MR Number:  [id number]  Location:  [facility] [address] [place] [address] Dept: [phone] Loc: [phone]           Date of Service:   05/05/2023  Start Time:   3 PM End Time:   4 PM  Provider/Observer:  Arley Phenix, Psy.D.       Clinical Neuropsychologist       Billing Code/Service: [phone]  Reason for Service:    Alyssa Blanchard is a 53 year old female referred for neuropsychological consultation during her ongoing admission to the comprehensive inpatient rehabilitation program.  The patient had been on CIR back in December 2023 due to debility and congestive heart failure.  The patient is also more recently been seen at [facility] in January of this year for acute on chronic congestive heart failure.  Patient has a past medical history that includes right lower extremity cellulitis, chronic diastolic congestive heart failure, anemia, PAF, asthma, type 2 diabetes, OSA on CPAP, hypertension, depression and anxiety and remote history of tobacco use.  Patient presented on 04/23/2023 with increasing shortness of breath and progressive fluid buildup in lower extremities.  Patient with workup indicating worsening congestive heart failure.  Hospital course was complicated by A-fib with RVR and followed by cardiology service.  Patient also had a bout with norovirus.  Right thigh cellulitis was also noted.  Patient was admitted to CIR due to decreased functional mobility/debility.  During today's clinical visit the patient quickly recognized me from our visit during her last admission to CIR.  Patient was oriented x 4 and was able to give an appropriate and accurate description of her medical status and what it been going on with her medically over the past year.  Patient acknowledged difficulty  coping with extended hospitalizations but noted that she was coping much better with her current hospitalization and adapting better than when she was at [facility].  Patient was quite appreciative of efforts that were being done to improve functional mobility and noted hard work on her part.  Patient noted that she had continued to be able to lose some of the retention of fluid.  However, patient was well aware of the complexity of her medical status.  HPI for the current admission:    HPI: Alyssa Blanchard is a 53 year old right-handed female well-known to inpatient rehab services with admission 02/28/2022 - 03/11/2022 for debility/morbid obesity with BMI 62.77/right lower extremity cellulitis as well as history of chronic diastolic congestive heart failure, anemia, PAF on Eliquis, asthma, type 2 diabetes mellitus, OSA with CPAP, hypertension, depression/anxiety, quit smoking 11 years ago.  Recent admission at [facility] 03/10/18/2025 - 03/17/2023 for acute on chronic congestive heart failure exacerbation and was diuresed aggressively.  Weight was 206 kg on admission at that time down to 179 kg on discharge.  Per chart review patient lives at home with his mother who is available 24/7.  Mother is able to do light housework.  Patient independent with a cane and driving prior to admission.  5 steps to entry of home.  Presented 04/23/2023 with increasing shortness of breath with progressive fluid buildup lower extremities x 1 week.  Chest x-ray showed mild enlargement of the cardiopericardial silhouette with indistinct pulmonary vascular could not exclude pulmonary venous hypertension.  No overt edema.  Admission chemistries unremarkable  except hemoglobin 9.3, BNP 66.4, troponin negative, blood cultures no growth to date.  Echocardiogram ejection fraction of 70 to 75%.  Left ventricle hyperdynamic function.  No evidence of mitral stenosis.  Hospital course complicated by A-fib with RVR follow-up per cardiology services  started on diltiazem and amiodarone for control.  Patient did require electrical cardioversion 04/28/2023 per Dr. [name].  She presently remains on Eliquis.  Patient she spiked a fever on 04/25/2023 with nausea and emesis.  GI pathogen panel positive for norovirus.  Blood cultures remain no growth to date, urine culture greater 100,000 E. coli..  Intravenous Rocephin initiated and completed.  Right thigh cellulitis and again patient continued on Rocephin until cpmpletion .  Therapy evaluations completed due to patient decreased functional mobility/debility was admitted for a comprehensive rehab program.    Medical History:   Past Medical History:  Diagnosis Date   Acute on chronic congestive heart failure (HCC) 05/02/2023   Anxiety    Asthma    Atrial fibrillation (HCC)    Depression    DM (diabetes mellitus), type 2 (HCC) 02/16/2022   Dysrhythmia    new onset Afib rvr   GERD (gastroesophageal reflux disease)    Heart failure (HCC)    Heel spur    bilat   History of bronchitis    History of chicken pox    History of urinary tract infection    Hypertension    Migraines    Plantar fasciitis    bilat   STD (sexually transmitted disease)    chl hx & hsv 1&2   Tremors of nervous system    Urinary incontinence          Patient Active Problem List   Diagnosis Date Noted   Type 2 diabetes mellitus with hyperglycemia, without long-term current use of insulin (HCC) 05/02/2023   Acute on chronic systolic CHF (congestive heart failure) (HCC) 05/02/2023   Acute on chronic congestive heart failure (HCC) 05/02/2023   Obesity, class 3 04/29/2023   Gastroenteritis due to norovirus 04/27/2023   UTI (urinary tract infection) 04/26/2023   Hypomagnesemia 04/26/2023   Hypokalemia 04/26/2023   Atrial fibrillation with rapid ventricular response (HCC) 04/26/2023   Fever 04/25/2023   Diarrhea 04/25/2023   Asthma 04/24/2023   Depression with anxiety 04/24/2023   Hypertension 04/24/2023    Acute on chronic diastolic heart failure (HCC) 04/23/2023   Rectal bleeding 10/14/2022   Anxiety 06/30/2022   Debility 02/28/2022   Prolonged QT interval 02/17/2022   Cellulitis of right leg 02/16/2022   Lumbar radiculopathy, right 02/16/2022   Anemia 02/16/2022   Iron deficiency anemia 02/02/2022   Type 2 diabetes mellitus without complication, without long-term current use of insulin (HCC) 12/25/2021   Chronic combined systolic and diastolic congestive heart failure (HCC) 09/25/2019   Acute pulmonary edema (HCC)    Paroxysmal atrial fibrillation (HCC) 08/02/2019   Bipolar disorder, in full remission, most recent episode manic (HCC) 04/15/2019   Psychosis (HCC) 04/14/2019   Not well controlled moderate persistent asthma 02/02/2018   Chronic rhinitis 02/02/2018   Status asthmaticus 11/29/2017   Acute respiratory failure with hypoxia (HCC) 11/29/2017   Gastroesophageal reflux disease 09/01/2017   Cough 12/31/2016   Dysuria 07/28/2016   Clinical infection 07/28/2016   History of asthma 04/17/2016   Acute bronchitis 04/17/2016   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 her dress was Appropriate and she was Well Groomed and her manners were Appropriate to the situation.  her participation was indicative of Appropriate and Attentive behaviors.  There were physical disabilities noted.  she displayed an appropriate level of  cooperation and motivation.    Interactions:    Active Appropriate  Attention:   within normal limits and attention span and concentration were age appropriate  Memory:   within normal limits; recent and remote memory intact  Visuo-spatial:   not examined  Speech (Volume):  normal  Speech:   normal; normal  Thought Process:  Coherent and Relevant  Coherent, Linear, and Logical  Though Content:  WNL; not suicidal and not homicidal  Orientation:   person, place, time/date, and situation  Judgment:   Good  Planning:   Fair  Affect:    Appropriate  Mood:    Dysphoric  Insight:   Good  Intelligence:   normal  Psychiatric History:  With past history of depression with anxiety and diagnoses in the past of bipolar disorder.  Patient denied any exacerbation of her mood status during today's visit.  Family Med/Psych History:  Family History  Problem Relation Age of Onset   Diabetes Mother    Hypertension Mother    Hyperlipidemia Mother    Kidney disease Mother        CKD stage 4   Pancreatic cancer Father        pancreatic   Hypertension Maternal Grandmother    Diabetes Maternal Grandmother    Heart failure Maternal Grandmother        CHF   Heart attack Maternal Grandfather    Hypertension Maternal Grandfather    Hypertension Paternal Grandmother    Alzheimer's disease Paternal Grandmother    Hypertension Paternal Grandfather    Cancer Maternal Uncle        melanoma   Cancer Paternal Uncle        kidney   Colon cancer Neg Hx    Esophageal cancer Neg Hx    Stomach cancer Neg Hx     Impression/DX:   Ednamae Markeisha Mancias is a 53 year old female referred for neuropsychological consultation during her ongoing admission to the comprehensive inpatient rehabilitation program.  The patient had been on CIR back in December 2023 due to debility and congestive heart failure.  The patient is also more recently been seen at [facility] in January of this year for acute on chronic  congestive heart failure.  Patient has a past medical history that includes right lower extremity cellulitis, chronic diastolic congestive heart failure, anemia, PAF, asthma, type 2 diabetes, OSA on CPAP, hypertension, depression and anxiety and remote history of tobacco use.  Patient presented on 04/23/2023 with increasing shortness of breath and progressive fluid buildup in lower extremities.  Patient with workup indicating worsening congestive heart failure.  Hospital course was complicated by A-fib with RVR and followed by cardiology service.  Patient also had a bout with norovirus.  Right thigh cellulitis was also noted.  Patient was admitted to CIR due to decreased functional mobility/debility.  During today's clinical visit the patient quickly recognized me from  our visit during her last admission to CIR.  Patient was oriented x 4 and was able to give an appropriate and accurate description of her medical status and what it been going on with her medically over the past year.  Patient acknowledged difficulty coping with extended hospitalizations but noted that she was coping much better with her current hospitalization and adapting better than when she was at [facility].  Patient was quite appreciative of efforts that were being done to improve functional mobility and noted hard work on her part.  Patient noted that she had continued to be able to lose some of the retention of fluid.  However, patient was well aware of the complexity of her medical status.  Disposition/Plan:  Today we worked on coping and adjustment issues and addressed ways to monitor mood status.  As of now the patient's mood is stable and she is motivated to continue to work on making functional gains and noting improvements in effort level in therapy.          Electronically Signed   _______________________ Arley Phenix, Psy.D. Clinical Neuropsychologist

## 2023-05-05 NOTE — Progress Notes (Signed)
   05/05/23 2333  BiPAP/CPAP/SIPAP  Reason BIPAP/CPAP not in use Non-compliant (refused, wants teaching during the day, but only during the day, patient states she went through too much today to try tonight. Will relay message to day shift.)

## 2023-05-06 LAB — GLUCOSE, CAPILLARY
Glucose-Capillary: 180 mg/dL — ABNORMAL HIGH (ref 70–99)
Glucose-Capillary: 135 mg/dL — ABNORMAL HIGH (ref 70–99)
Glucose-Capillary: 138 mg/dL — ABNORMAL HIGH (ref 70–99)
Glucose-Capillary: 217 mg/dL — ABNORMAL HIGH (ref 70–99)

## 2023-05-06 MED ORDER — POLYETHYLENE GLYCOL 3350 17 G PO PACK
17.0000 g | PACK | Freq: Every day | ORAL | Status: DC
  Administered 2023-05-06 – 2023-05-12 (×6): 17 g via ORAL
  Filled 2023-05-06 (×7): qty 1

## 2023-05-06 NOTE — Progress Notes (Signed)
 PROGRESS NOTE   Subjective/Complaints:   Pt reports didn't pee much yesterday after Bumex extra dose, but did pee a lot last night.   Started Anusol for some mucus/blood from rectum when attempted BM last night- L shoulder pain all night- hot pack helpful, but would prefer bigger heating pad- will order Kpad.  Weight 171 kg this AM- which is lower than yesterday.  Asking about trp injections for tomorrow.  ROS:    Pt denies SOB, abd pain, CP, N/V/C/D, and vision changes   Feels swollen (+)- less so    Objective:   No results found.  Recent Labs    05/05/23 2107  WBC 7.9  HGB 10.4*  HCT 35.4*  PLT 201    Recent Labs    05/03/23 1256 05/04/23 0609  NA 137 134*  K 3.8 4.1  CL 95* 97*  CO2 31 28  GLUCOSE 160* 146*  BUN 10 10  CREATININE 0.80 0.96  CALCIUM 9.1 8.9    Intake/Output Summary (Last 24 hours) at 05/06/2023 0830 Last data filed at 05/05/2023 1700 Gross per 24 hour  Intake 413 ml  Output --  Net 413 ml        Physical Exam: Vital Signs Blood pressure 122/61, pulse 89, temperature 98.6 F (37 C), resp. rate 17, height 5\' 5"  (1.651 m), weight (!) 171 kg, last menstrual period 12/20/2017, SpO2 91%.       General: awake, alert, appropriate, Ktaping for L shoulder to L hand; nursing in room; NAD HENT: conjugate gaze; oropharynx moist CV: regular rate and rhythm; no JVD Pulmonary: CTA B/L; no W/R/R- good air movement GI: soft, NT, ND, (+)BS- hypoactive- protuberant-  Psychiatric: appropriate, interactive Neurological: Ox3 Extremities: no LE edema palpated at all- not sene in hands  Reports altered sensation medial left hand Musculoskeletal:     Cervical back: Neck supple.     Moving all 4 extremities to gravity and resistance  TTP L periscapular muscles Tinels at elbow- equivocal Mild L trap pain with L shoulder abduction and ROM Improved somewhat- wearing lidoderm patch on  posterior L shoulder  Skin:    General: Skin is warm.     Comments: no LE edema B/L Some eczema- not bad currently Heels dried/cracked    Assessment/Plan: 1. Functional deficits which require 3+ hours per day of interdisciplinary therapy in a comprehensive inpatient rehab setting. Physiatrist is providing close team supervision and 24 hour management of active medical problems listed below. Physiatrist and rehab team continue to assess barriers to discharge/monitor patient progress toward functional and medical goals  Care Tool:  Bathing    Body parts bathed by patient: Right arm, Left arm, Chest, Abdomen, Front perineal area, Left upper leg, Right upper leg, Face   Body parts bathed by helper: Right lower leg, Left lower leg, Buttocks     Bathing assist Assist Level: Moderate Assistance - Patient 50 - 74%     Upper Body Dressing/Undressing Upper body dressing   What is the patient wearing?: Pull over shirt    Upper body assist Assist Level: Set up assist    Lower Body Dressing/Undressing Lower body dressing      What  is the patient wearing?: Underwear/pull up, Pants     Lower body assist Assist for lower body dressing: Maximal Assistance - Patient 25 - 49%     Toileting Toileting    Toileting assist Assist for toileting: Contact Guard/Touching assist     Transfers Chair/bed transfer  Transfers assist     Chair/bed transfer assist level: Contact Guard/Touching assist     Locomotion Ambulation   Ambulation assist      Assist level: Contact Guard/Touching assist Assistive device: Walker-rolling Max distance: 244 ft   Walk 10 feet activity   Assist     Assist level: Contact Guard/Touching assist Assistive device: Walker-rolling   Walk 50 feet activity   Assist    Assist level: Contact Guard/Touching assist Assistive device: Walker-rolling    Walk 150 feet activity   Assist    Assist level: Contact Guard/Touching  assist Assistive device: Walker-rolling    Walk 10 feet on uneven surface  activity   Assist Walk 10 feet on uneven surfaces activity did not occur: Safety/medical concerns         Wheelchair     Assist Is the patient using a wheelchair?: Yes (transport only) Type of Wheelchair: Manual    Wheelchair assist level: Dependent - Patient 0% Max wheelchair distance: 150    Wheelchair 50 feet with 2 turns activity    Assist        Assist Level: Dependent - Patient 0%   Wheelchair 150 feet activity     Assist      Assist Level: Dependent - Patient 0%   Blood pressure 122/61, pulse 89, temperature 98.6 F (37 C), resp. rate 17, height 5\' 5"  (1.651 m), weight (!) 171 kg, last menstrual period 12/20/2017, SpO2 91%.  Medical Problem List and Plan: 1. Functional deficits secondary to debility related to acute on chronic diastolic congestive heart failure with exacerbation- diuresed significant weight             -patient may  shower             -ELOS/Goals: 5-7 days mod I- doesn't have help at home  D/c 3/3  Con't CIR PT and OT 2.  Antithrombotics: -DVT/anticoagulation:  Pharmaceutical: Eliquis             -antiplatelet therapy: N/A 3. Pain Management: Voltaren gel twice daily, Lyrica 200 mg twice daily, hydrocodone as needed  2/21- pain usually controlled- con't regimen  -2/23 check C spine xray due to shooting pain reported down L arm.  2/24- xray shows some degenerative changes, but nothing to explain L side specifically.   -her Robaxin extremely helpful  2/26- will add Kpad per pt/nursing- will do trP injections tomorrow 4. Mood/Behavior/Sleep: Melatonin 10 mg nightly             -antipsychotic agents: N/A 5. Neuropsych/cognition: This patient is capable of making decisions on her own behalf. 6. Skin/Wound Care: Routine skin checks 7. Fluids/Electrolytes/Nutrition: Routine in and outs with follow-up chemistries 8.  PAF/A-fib with RVR.  Cardioversion  04/28/2023.  Cardiac rate controlled.  Toprol-XL 75 mg daily  2/25- sounds regular- con't regimen 9.  E. coli UTI.  Completed course of Rocephin 10.  Right thigh cellulitis.  Completed course of Rocephin 11.  Gastroenteritis secondary to norovirus/diarrhea.  Improving.  Supportive care..  Enteric precautions- no Sx's for 4 days, can stop if OK with Infection prevention  2/21- can stop since been 5 days since last Sx. Actually is constipated now  -2/23 LBM  today. Pt with nausea today.  Check abd xray. Will order 4mg  zofran- discussed with pharmacy(appreciate assistance) as pt has intermittent elevated QT. QTC not elevated on last EKG. Check labs and replete Mg for goal 2.0 ,K+ for goal above 4. If she needs more than one dose consider EKG tomorrow.   Check orthostatic vital signs.   2/24- no nausea- had regular Bms x2 yesterday 12.  Asthma.  Continue inhalers as directed. check oxygen saturations every shift 13.  OSA.  CPAP 14.  Acute on chronic CHF exacerbation.  Monitor for any signs of fluid overload.  Continue Bumex 1 mg twice daily, Aldactone 25 mg daily  2/21- weight up 4 kg- however bed was changed- will monitor trend- no LE edema  2/22 Will adjust Bumex timing to 3pm  -2/23 Wt a lower today, recheck renal panel  2/24- Weight looking better- is still diuresing  Filed Weights   05/05/23 0500 05/05/23 0800 05/06/23 0600  Weight: (!) 175 kg (!) 175.4 kg (!) 171 kg    2/25- Weight about the same, but pt feels more swollen- in hands and feet- will try 1 mg Bumex in addition to 1 mg BID  2/26- Will wait on bumex- weight down 4 kg- she didn't pee as much yesterday during day, but did last night- no signs of edema on exam- see how things go with weight in next 1 day.   15.  Acute on chronic anemia.  Niferex daily.  Check CBC 16.  Diabetes mellitus.  Latest hemoglobin A1c 6.7.  Presently on SSI.  Patient on Ozempic prior to admission.  2/21- BG's 109-229- only 1 value over 200- will see if  family cane bring in Ozempic  2/23 fair control CBGs, continue to monitor   2/24-2/26 Fair control- family to bring in Ozempic. -she said will wait til gets home CBG (last 3)  Recent Labs    05/05/23 1624 05/05/23 2047 05/06/23 0612  GLUCAP 154* 166* 180*    17.  Obesity.  BMI 62.77.  Dietary follow-up  2/21- weight 178.8 kg- up a lot form yesterday but has different bed- will monitor trend  2/24- weight back down- to 172.6 kg- which is weight she came over at.   2/25- Weight up 5 lbs since low of what she came over at- will do 1 mg Bumex x1 18. Eczema- usually takes Dupixant- will see if can restart. Pt can bring in from home 19. Constipation- LBM 4 days ago- wants to wait on Sorbitol  2/21- will give Sorbitol Saturday if no BM- so far it's been 5 days- no Bariatric BSC in room.   2/22 reports large BM yesterday, continue to monitor bowel function  2/23 LBM today  2/24- 4 Bms over weekend- going well again.   2/25- LBM 2 days ago- will monitor  2/26- LBM 3 days ago- wil add Miralax- if doesn't go, tomorrow, will add Sorbitol  20. Preventative- will do vaseline for heels and change to regular bed per pt- see if can get Zyrtec from home to take since don't have here. 21. Yeast infection -2/22 fluconazole 150mg  1x  -2/23 symptoms improved today   I spent a total of 39   minutes on total care today- >50% coordination of care- due to d/w pt and nursing at length about pt's Sx's- and intervention as detailed above.      LOS: 6 days A FACE TO FACE EVALUATION WAS PERFORMED  Kynisha Memon 05/06/2023, 8:30 AM

## 2023-05-06 NOTE — Progress Notes (Signed)
 Occupational Therapy Session Note  Patient Details  Name: Alyssa Blanchard MRN: [id number] Date of Birth: 1970/08/27  Today's Date: 05/06/2023 OT Individual Time: 0800-0900 OT Individual Time Calculation (min): 60 min    Short Term Goals: Week 1:  OT Short Term Goal 1 (Week 1): Patient to perform toileting with Min assist and AE as needed OT Short Term Goal 2 (Week 1): Patient to perform shower transfer with Min assist OT Short Term Goal 3 (Week 1): Patient to perform LB dressing with AE set up assist  Skilled Therapeutic Interventions/Progress Updates:      Therapy Documentation Precautions:  Precautions Precautions: Fall Recall of Precautions/Restrictions: Intact Restrictions Weight Bearing Restrictions Per Provider Order: No Session 1 General: "Let's go outside later!" Pt supine in bed upon OT arrival, agreeable to OT session.  Pain: pt reporting unrated increased shoulder pain overnight and in AM, activity, intermittent rest breaks, distractions provided for pain management, pt reports tolerable to proceed. Pt reports will be receiving trigger point injections tomorrow from MD  ADL:  Pt completed the following ADL tasks this AM in order to increase cardiovascular endurance during ADL tasks:  Bed mobility: SBA with increased time from flat bed Grooming/oral hygiene: SBA standing at sink, using elbows to prop self onto sink to wash face Toilet transfer: SBA without AE on standard toilet from bed ~10 ft Toileting: SBA managing pants and voiding urine UB dressing: SBA seated EOB for donning/doffing overhead shirt LB dressing: SBA donning underwear and pants without AD Footwear: pt trialing sock aide although unsuccessful d/t ACE wrapping, did demo good carryover with AE, total A for ACE wrapping Transfers: Pt able to ambulate arounf the room with PRN rest breaks and no AD at SBA level. Pt able to move rolling table around room for functional activity without LOB   Pt supine  in bed with bed alarm activated, 2 bed rails up, call light within reach and 4Ps assessed.   Therapy/Group: Individual Therapy  Velia Meyer, OTD, OTR/L 05/06/2023, 4:09 PM

## 2023-05-06 NOTE — Plan of Care (Signed)
  Problem: RH BLADDER ELIMINATION Goal: RH STG MANAGE BLADDER WITH ASSISTANCE Description: STG Manage Bladder With supervision Assistance Outcome: Progressing   Problem: RH SAFETY Goal: RH STG ADHERE TO SAFETY PRECAUTIONS W/ASSISTANCE/DEVICE Description: STG Adhere to Safety Precautions With supervision  Assistance/Device. Outcome: Progressing   Problem: RH PAIN MANAGEMENT Goal: RH STG PAIN MANAGED AT OR BELOW PT'S PAIN GOAL Description: <4w/ prns Outcome: Progressing

## 2023-05-06 NOTE — Progress Notes (Signed)
 Occupational Therapy Session Note  Patient Details  Name: Alyssa Blanchard MRN: [id number] Date of Birth: 06-16-1970  Today's Date: 05/06/2023 OT Individual Time: 1425-1535 OT Individual Time Calculation (min): 70 min    Short Term Goals: Week 1:  OT Short Term Goal 1 (Week 1): Patient to perform toileting with Min assist and AE as needed OT Short Term Goal 2 (Week 1): Patient to perform shower transfer with Min assist OT Short Term Goal 3 (Week 1): Patient to perform LB dressing with AE set up assist  Skilled Therapeutic Interventions/Progress Updates:  Skilled OT intervention completed with focus on activity tolerance, ambulatory endurance, BUE strengthening. Pt received supine in bed with nursing present, agreeable to session. LUE pain reported with positional changes; pre-medicated. OT offered rest breaks and repositioning throughout for pain reduction.  Pt transitioned to EOB with mod I, completed all sit > stands and ambulatory transfers with distant supervision without AD during session. Ambulated > toilet, distant supervision for continent urinary void with pt using toileting wand for pericare. Ambulated to sink for hand hygiene.  Pt requested assist to re-wrap RLE edema wrap as it slid down. Pt lowered pants and OT rewrapped dependently. Discussed how she could 1/2 circle sit to apply at home, as TEDS may be challenging with breathing/SOB/body habitus. Redonned pants with supervision. Ambulated > w/c for dependent transport to outside courtyard as pt verbalized she just walked with PT all the way down and was fatigued.  Seated in w/c, pt completed the following BUE exercises to promote BUE strength/endurance needed for independence with functional transfers and BADLs: (With red theraband) 12 reps Horizontal abduction Self-anchored shoulder flexion each arm (AROM on LUE only 2/2 pain) Self-anchored bicep flexion each arm Self-anchored tricep extension each arm (RUE only)  Pt  shared her challenges with her own and her mother's health and her desire to be as independent as possible for as long as possible. Discussed ways she can remain independent with doing the things she enjoys most.   Ambulated 100 ft without AD on pavement, though unlevel surfaces, including slight decline without AD or LOB. Pt did have onset of SOB however improved with rest. Back in room, pt lowered pants and assisted OT with doffing of edema wraps with mod A. Pt remained seated EOB, with bed alarm on/activated, and with all needs in reach at end of session.   Therapy Documentation Precautions:  Precautions Precautions: Fall Recall of Precautions/Restrictions: Intact Restrictions Weight Bearing Restrictions Per Provider Order: No    Therapy/Group: Individual Therapy  Melvyn Novas, MS, OTR/L  05/06/2023, 3:44 PM

## 2023-05-06 NOTE — Plan of Care (Signed)
  Problem: Consults Goal: RH GENERAL PATIENT EDUCATION Description: See Patient Education module for education specifics. Outcome: Progressing   Problem: RH BOWEL ELIMINATION Goal: RH STG MANAGE BOWEL WITH ASSISTANCE Description: STG Manage Bowel with Assistance. Outcome: Progressing   Problem: RH BLADDER ELIMINATION Goal: RH STG MANAGE BLADDER WITH ASSISTANCE Description: STG Manage Bladder With supervision Assistance Outcome: Progressing   Problem: RH SKIN INTEGRITY Goal: RH STG SKIN FREE OF INFECTION/BREAKDOWN Description:  Manage skin integrity, free from infection/ breakdown with supervision assistance   Outcome: Progressing   Problem: RH SAFETY Goal: RH STG ADHERE TO SAFETY PRECAUTIONS W/ASSISTANCE/DEVICE Description: STG Adhere to Safety Precautions With supervision  Assistance/Device. Outcome: Progressing   Problem: RH PAIN MANAGEMENT Goal: RH STG PAIN MANAGED AT OR BELOW PT'S PAIN GOAL Description: <4w/ prns Outcome: Progressing   Problem: RH KNOWLEDGE DEFICIT GENERAL Goal: RH STG INCREASE KNOWLEDGE OF SELF CARE AFTER HOSPITALIZATION Outcome: Progressing

## 2023-05-06 NOTE — Progress Notes (Signed)
 Recreational Therapy Session Note  Patient Details  Name: Alyssa Blanchard MRN: [id number] Date of Birth: 12/27/1970 Today's Date: 05/06/2023  Pain: no c/o Skilled Therapeutic Interventions/Progress Updates:  Goal:  Pt will identify 2 strategies to use to assist in health management once home with min questioning cues.  MET  Session focused on pt education in regards to use leisure education, activity analysis, implementation and maintenance of a healthy lifestyle.  Pt expressed concern about her ability to follow through with healthy lifestyle changes once home.  Pt states she needs to do better and make changes but is uncertain about how successful she will be.  Pt jokingly stated she needed to rent a room here to help with her success.  Discussed challenges and identified strategies to navigate those challenges with min cues.  Discussed personal accountability, have a support person assist with her accountability, food/activity/health journaling.  Pt stated she already had supplies at home but has failed to consistently use them.  Pt also requesting more formal education on healthy eating and inquiring about potential classes once she leave to address this as well as generalized wellness.  PA made aware of pt requests.  Therapy/Group: Individual Therapy Mable Lashley 05/06/2023, 10:34 AM

## 2023-05-06 NOTE — Progress Notes (Signed)
 Recreational Therapy Discharge Summary Patient Details  Name: Alyssa Blanchard MRN: [id number] Date of Birth: Apr 19, 1970 Today's Date: 05/06/2023  Comments on progress toward goals: Pt is scheduled for discharge home 3/3 at Mod I level.  TR sessions focused on pt education in regards to leisure education, activity analysis/modifications, stress/management/coping and healthy lifestyle changes.  Pt stated desire to implement lifestyle changes but is uncertain that she will follow through with these changes.  Discussed potential strategies to assist her in working toward her goals.  Pt appreciative of these sessions. Reasons for discharge: discharge from hospital  Follow-up: Home Health  Patient/family agrees with progress made and goals achieved: Yes  Haleem Hanner 05/06/2023, 12:33 PM

## 2023-05-06 NOTE — Progress Notes (Signed)
 Physical Therapy Session Note  Patient Details  Name: Alyssa Blanchard MRN: [id number] Date of Birth: August 25, 1970  Today's Date: 05/06/2023 PT Individual Time: 1302-1411 PT Individual Time Calculation (min): 69 min   Short Term Goals: Week 1:  PT Short Term Goal 1 (Week 1): =LTGs d/t ELOS  Skilled Therapeutic Interventions/Progress Updates:     Pt received supine in bed and agrees to therapy. No complaint of pain. Pt performs bd mobility with bed features. Pt performs ambulatory transfer to toilet with cues for positioning and safety. Pt ambulates to sink to wash hands, working on activity tolerance and balance. Remainder of session emphasizes gait and endurance training. Pt ambulates from room with rollator, navigating elevator and obstacle,s ambulating ~300' prior to requiring rest break. Pt then ambulates outside for gait training over unlevel and varying surfaces. PT provides cues for upright gaze to improve posture and balance, pursed lip breathing to optimize oxygen sats, and monitoring level of exertion to increase safety awareness. Pt ambulates bouts of 320', 430', and 340' with extended seated rest breaks. Pt left seated in WC with alarm intact and all needs within reach.   Therapy Documentation Precautions:  Precautions Precautions: Fall Recall of Precautions/Restrictions: Intact Restrictions Weight Bearing Restrictions Per Provider Order: No    Therapy/Group: Individual Therapy  Beau Fanny, PT, DPT 05/06/2023, 3:54 PM

## 2023-05-07 LAB — GLUCOSE, CAPILLARY
Glucose-Capillary: 192 mg/dL — ABNORMAL HIGH (ref 70–99)
Glucose-Capillary: 148 mg/dL — ABNORMAL HIGH (ref 70–99)
Glucose-Capillary: 135 mg/dL — ABNORMAL HIGH (ref 70–99)
Glucose-Capillary: 141 mg/dL — ABNORMAL HIGH (ref 70–99)

## 2023-05-07 MED ORDER — LIDOCAINE HCL 1 % IJ SOLN
10.0000 mL | Freq: Once | INTRAMUSCULAR | Status: AC
  Administered 2023-05-07: 10 mL
  Filled 2023-05-07: qty 10

## 2023-05-07 MED ORDER — SORBITOL 70 % SOLN
45.0000 mL | Freq: Once | Status: AC
  Administered 2023-05-07: 45 mL via ORAL
  Filled 2023-05-07: qty 60

## 2023-05-07 NOTE — Group Note (Signed)
 Patient Details Name: Samoria Fedorko MRN: [id number] DOB: Mar 22, 1970 Today's Date: 05/07/2023  Time Calculation: OT Group Time Calculation OT Group Start Time: 1430 OT Group Stop Time: 1530 OT Group Time Calculation (min): 60 min      Group Description: Dance Group: Pt participated in dance group with an emphasis on social interaction, motor planning, increasing overall activity tolerance and bimanual tasks. All songs were selected by group members. Dance moves included AROM of BUE/BLE gross motor movements with an emphasis on building functional endurance.     Individual level documentation: Patient completed group from sitting level. Patient needed supervision to complete various dance moves with OT providing visual model.  Patient able to create her own modifications during group.  Pain: Pain Assessment Pain Scale: 0-10 Pain Score: 0-No pain  Precautions:  Falls   Jhalen Eley P Woodson 05/07/2023, 3:52 PM

## 2023-05-07 NOTE — Progress Notes (Signed)
 PROGRESS NOTE   Subjective/Complaints:   Pt reports hasn't been weighed standing yet, but did in bed.  Weight in bed was 174.1 kg- waiting for standing weight.   Pt feels weight loss/diuresis going well.  LBM 2/23- feels like "its right there'- but cannot go- doesn't want suppository- wants something by mouth.   Walked "everywhere" yesterday- required nebs when got back- has nebs machine at home, but need neb albuterol.   Super itchy from [place] so was removed.     ROS:    Pt denies SOB, abd pain, CP, N/V/ (+)C/D, and vision changes   Feels swollen (+)- less so    Objective:   No results found.  Recent Labs    05/05/23 2107  WBC 7.9  HGB 10.4*  HCT 35.4*  PLT 201    No results for input(s): "NA", "K", "CL", "CO2", "GLUCOSE", "BUN", "CREATININE", "CALCIUM" in the last 72 hours.   Intake/Output Summary (Last 24 hours) at 05/07/2023 0826 Last data filed at 05/07/2023 0747 Gross per 24 hour  Intake 472 ml  Output --  Net 472 ml        Physical Exam: Vital Signs Blood pressure 138/65, pulse 81, temperature 98.5 F (36.9 C), resp. rate 16, height 5\' 5"  (1.651 m), weight (!) 174.1 kg, last menstrual period 12/20/2017, SpO2 95%.        General: awake, alert, appropriate, sitting up in bed; just spilled water on self; NAD HENT: conjugate gaze; oropharynx moist CV: regular rate and rhythm; no JVD Pulmonary: CTA B/L; no W/R/R- good air movement GI: soft, NT, distended vs protuberant, (+)BS- hypoactive-  Psychiatric: appropriate Neurological: Ox3 MSK- L shoulder trigger points palpated-  Extremities: no LE edema palpated at all- - looks good; not seen in hands- again in spite of K tape being removed  Reports altered sensation medial left hand Musculoskeletal:     Cervical back: Neck supple.     Moving all 4 extremities to gravity and resistance  TTP L periscapular muscles Tinels at elbow-  equivocal Mild L trap pain with L shoulder abduction and ROM Improved somewhat- wearing lidoderm patch on posterior L shoulder  Skin:    General: Skin is warm.     Comments: no LE edema B/L Some eczema- not bad currently Heels dried/cracked    Assessment/Plan: 1. Functional deficits which require 3+ hours per day of interdisciplinary therapy in a comprehensive inpatient rehab setting. Physiatrist is providing close team supervision and 24 hour management of active medical problems listed below. Physiatrist and rehab team continue to assess barriers to discharge/monitor patient progress toward functional and medical goals  Care Tool:  Bathing    Body parts bathed by patient: Right arm, Left arm, Chest, Abdomen, Front perineal area, Left upper leg, Right upper leg, Face   Body parts bathed by helper: Right lower leg, Left lower leg, Buttocks     Bathing assist Assist Level: Moderate Assistance - Patient 50 - 74%     Upper Body Dressing/Undressing Upper body dressing   What is the patient wearing?: Pull over shirt    Upper body assist Assist Level: Set up assist    Lower Body Dressing/Undressing Lower body  dressing      What is the patient wearing?: Underwear/pull up, Pants     Lower body assist Assist for lower body dressing: Maximal Assistance - Patient 25 - 49%     Toileting Toileting    Toileting assist Assist for toileting: Contact Guard/Touching assist     Transfers Chair/bed transfer  Transfers assist     Chair/bed transfer assist level: Contact Guard/Touching assist     Locomotion Ambulation   Ambulation assist      Assist level: Contact Guard/Touching assist Assistive device: Walker-rolling Max distance: 244 ft   Walk 10 feet activity   Assist     Assist level: Contact Guard/Touching assist Assistive device: Walker-rolling   Walk 50 feet activity   Assist    Assist level: Contact Guard/Touching assist Assistive device:  Walker-rolling    Walk 150 feet activity   Assist    Assist level: Contact Guard/Touching assist Assistive device: Walker-rolling    Walk 10 feet on uneven surface  activity   Assist Walk 10 feet on uneven surfaces activity did not occur: Safety/medical concerns         Wheelchair     Assist Is the patient using a wheelchair?: Yes (transport only) Type of Wheelchair: Manual    Wheelchair assist level: Dependent - Patient 0% Max wheelchair distance: 150    Wheelchair 50 feet with 2 turns activity    Assist        Assist Level: Dependent - Patient 0%   Wheelchair 150 feet activity     Assist      Assist Level: Dependent - Patient 0%   Blood pressure 138/65, pulse 81, temperature 98.5 F (36.9 C), resp. rate 16, height 5\' 5"  (1.651 m), weight (!) 174.1 kg, last menstrual period 12/20/2017, SpO2 95%.  Medical Problem List and Plan: 1. Functional deficits secondary to debility related to acute on chronic diastolic congestive heart failure with exacerbation- diuresed significant weight             -patient may  shower             -ELOS/Goals: 5-7 days mod I- doesn't have help at home  D/c 3/3  Con't CIR PT and OT  Will need nebs to go home with- has machine .  Trigger point injections done today 2.  Antithrombotics: -DVT/anticoagulation:  Pharmaceutical: Eliquis             -antiplatelet therapy: N/A 3. Pain Management: Voltaren gel twice daily, Lyrica 200 mg twice daily, hydrocodone as needed  2/21- pain usually controlled- con't regimen  -2/23 check C spine xray due to shooting pain reported down L arm.  2/24- xray shows some degenerative changes, but nothing to explain L side specifically.   -her Robaxin extremely helpful  2/26- will add Kpad per pt/nursing- will do trP injections tomorrow  2/27- kpad helpful- will do trp injections today 4. Mood/Behavior/Sleep: Melatonin 10 mg nightly             -antipsychotic agents: N/A 5.  Neuropsych/cognition: This patient is capable of making decisions on her own behalf. 6. Skin/Wound Care: Routine skin checks 7. Fluids/Electrolytes/Nutrition: Routine in and outs with follow-up chemistries 8.  PAF/A-fib with RVR.  Cardioversion 04/28/2023.  Cardiac rate controlled.  Toprol-XL 75 mg daily  2/25- sounds regular- con't regimen 9.  E. coli UTI.  Completed course of Rocephin 10.  Right thigh cellulitis.  Completed course of Rocephin 11.  Gastroenteritis secondary to norovirus/diarrhea.  Improving.  Supportive care.Marland Kitchen  Enteric precautions- no Sx's for 4 days, can stop if OK with Infection prevention  2/21- can stop since been 5 days since last Sx. Actually is constipated now  -2/23 LBM today. Pt with nausea today.  Check abd xray. Will order 4mg  zofran- discussed with pharmacy(appreciate assistance) as pt has intermittent elevated QT. QTC not elevated on last EKG. Check labs and replete Mg for goal 2.0 ,K+ for goal above 4. If she needs more than one dose consider EKG tomorrow.   Check orthostatic vital signs.   2/24- no nausea- had regular Bms x2 yesterday 12.  Asthma.  Continue inhalers as directed. check oxygen saturations every shift  2/27- needed nebs yesterday- will send home with nebs albuterol.  13.  OSA.  CPAP 14.  Acute on chronic CHF exacerbation.  Monitor for any signs of fluid overload.  Continue Bumex 1 mg twice daily, Aldactone 25 mg daily  2/21- weight up 4 kg- however bed was changed- will monitor trend- no LE edema  2/22 Will adjust Bumex timing to 3pm  -2/23 Wt a lower today, recheck renal panel  2/24- Weight looking better- is still diuresing  Filed Weights   05/06/23 0600 05/06/23 0800 05/07/23 0500  Weight: (!) 171 kg (!) 174 kg (!) 174.1 kg    2/25- Weight about the same, but pt feels more swollen- in hands and feet- will try 1 mg Bumex in addition to 1 mg BID  2/26- Will wait on bumex- weight down 4 kg- she didn't pee as much yesterday during day, but did  last night- no signs of edema on exam- see how things go with weight in next 1 day.   2/27- so far looks like weight  might be up- since in bed- comparing to weight in bed yesterday, up 3 kg- will give afternoon Bumex if continues tomorrow-  15.  Acute on chronic anemia.  Niferex daily.  Check CBC 16.  Diabetes mellitus.  Latest hemoglobin A1c 6.7.  Presently on SSI.  Patient on Ozempic prior to admission.  2/21- BG's 109-229- only 1 value over 200- will see if family cane bring in Ozempic  2/23 fair control CBGs, continue to monitor   2/27 Fair control- family to bring in Ozempic. -she said will wait til gets home CBG (last 3)  Recent Labs    05/06/23 1617 05/06/23 2048 05/07/23 0556  GLUCAP 138* 217* 192*    17.  Obesity.  BMI 62.77.  Dietary follow-up  2/21- weight 178.8 kg- up a lot form yesterday but has different bed- will monitor trend  2/24- weight back down- to 172.6 kg- which is weight she came over at.   2/25- Weight up 5 lbs since low of what she came over at- will do 1 mg Bumex x1 18. Eczema- usually takes Dupixant- will see if can restart. Pt can bring in from home 19. Constipation- LBM 4 days ago- wants to wait on Sorbitol  2/21- will give Sorbitol Saturday if no BM- so far it's been 5 days- no Bariatric BSC in room.   2/22 reports large BM yesterday, continue to monitor bowel function  2/23 LBM today  2/24- 4 Bms over weekend- going well again.   2/25- LBM 2 days ago- will monitor  2/26- LBM 3 days ago- wil add Miralax- if doesn't go, tomorrow, will add Sorbitol   2/27- Will give 45 cc Sorbitol today after therapy 20. Preventative- will do vaseline for heels and change to regular bed per pt- see  if can get Zyrtec from home to take since don't have here. 21. Yeast infection -2/22 fluconazole 150mg  1x  -2/23 symptoms improved today   Patient here for trigger point injections for  Consent done and on chart.  Cleaned areas with alcohol and injected using a 27 gauge  1.5 inch needle  Injected 3cc- none wasted Using 1% Lidocaine with no EPI  Upper traps L only Levators= L only Posterior scalenes Middle scalenes- L only Splenius Capitus Pectoralis Major Rhomboids- L x3 Infraspinatus Teres Major/minor- L only x2 Thoracic paraspinals Lumbar paraspinals Other injections-     There was no bleeding or complications.  Patient was advised to drink a lot of water on day after injections to flush system Will have increased soreness for 12-48 hours after injections.  Can use Lidocaine patches the day AFTER injections Can use theracane on day of injections in places didn't inject Can use heating pad 4-6 hours AFTER injections   I spent a total of 57   minutes on total care today- >50% coordination of care- due to  d/w team about  bowels, pain, and doing trP injections- also d/w PA about nebs.    LOS: 7 days A FACE TO FACE EVALUATION WAS PERFORMED  Rula Keniston 05/07/2023, 8:26 AM

## 2023-05-07 NOTE — Progress Notes (Signed)
 Initial Nutrition Assessment  DOCUMENTATION CODES:   Morbid obesity  INTERVENTION:  Healthy eating hand outs at discharge.  Continue to encourage healthy eating patterns    NUTRITION DIAGNOSIS:   Food and nutrition related knowledge deficit related to limited prior education as evidenced by per patient/family report.    GOAL:   Patient will meet greater than or equal to 90% of their needs    MONITOR:   PO intake  REASON FOR ASSESSMENT:   Consult Assessment of nutrition requirement/status, Diet education  ASSESSMENT:  53 y.o. F, Presented to ED from home on 04/23/2023 with complaints of increased leg swelling, and SOB. PMH:  chronic HFpEF, PAF on Eliquis, asthma, T2DM, HTN, depression/anxiety, morbid obesity,  Patient was recently admitted at [facility] 03/11/2023-03/17/2023 for acute on chronic HFpEF exacerbation.  She was diuresed aggressively.  Per discharge summary she was 17.5 L net deficit.  Weight was 206 kg on admission down to 179 kg on discharge.  Noted good appetite with no changes noted. Weight fluctuation expected do to diuresis.   Patient was not available x 2 attempts.   Admit weight: 167 kg Hospital weight  history: 05/07/23 0500 174.1 kg Abnormal  383.82 lbs  05/06/23 0800 174 kg Abnormal  383.6 lbs  05/06/23 0600 171 kg Abnormal  376.99 lbs  05/05/23 0800 175.4 kg Abnormal  386.69 lbs  05/05/23 0500 175 kg Abnormal  385.81 lbs  05/04/23 1300 176.4 kg Abnormal  389 lbs  05/04/23 0500 172.6 kg Abnormal  380.51 lbs  05/03/23 0500 174.7 kg Abnormal  385.14 lbs  05/01/23 0500 178.8 kg Abnormal  394.18 lbs    Average Meal Intake:  100% intake x 5 recorded meals  Nutritionally Relevant Medications: Scheduled Meds:  cetirizine  10 mg Oral Daily   iron polysaccharides  150 mg Oral Daily   magnesium oxide  400 mg Oral Daily   sorbitol  45 mL Oral Once   spironolactone  25 mg Oral Daily     Labs Reviewed    NUTRITION - FOCUSED PHYSICAL  EXAM:  Flowsheet Row Most Recent Value  Orbital Region Unable to assess  Upper Arm Region Unable to assess  Thoracic and Lumbar Region Unable to assess  Buccal Region Unable to assess  Temple Region Unable to assess  Clavicle Bone Region Unable to assess  Clavicle and Acromion Bone Region Unable to assess  Scapular Bone Region Unable to assess  Dorsal Hand Unable to assess  Patellar Region Unable to assess  Anterior Thigh Region Unable to assess  Posterior Calf Region Unable to assess  Edema (RD Assessment) Unable to assess  Hair Unable to assess  Eyes Unable to assess  Mouth Unable to assess  Skin Unable to assess  Nails Unable to assess       Diet Order:   Diet Order             DIET DYS 3 Room service appropriate? Yes; Fluid consistency: Thin  Diet effective now                   EDUCATION NEEDS:   Not appropriate for education at this time  Skin:  Skin Assessment: Skin Integrity Issues: Skin Integrity Issues:: Diabetic Ulcer Diabetic Ulcer: Right thigh  Last BM:  05/03/23  Height:   Ht Readings from Last 1 Encounters:  05/01/23 5\' 5"  (1.651 m)    Weight:   Wt Readings from Last 1 Encounters:  05/07/23 (!) (P) 174.5 kg  Ideal Body Weight:     BMI:  Body mass index is 64.02 kg/m (pended).  Estimated Nutritional Needs:   Kcal:  1705-2000 kcal  Protein:  75-100 g  Fluid:  36ml/kcal    Jamelle Haring RDN, LDN Clinical Dietitian   If unable to reach, please contact "RD Inpatient" secure chat group between 8 am-4 pm daily"

## 2023-05-07 NOTE — Progress Notes (Signed)
 Physical Therapy Session Note  Patient Details  Name: Alyssa Blanchard MRN: [id number] Date of Birth: 1970/12/22  Today's Date: 05/07/2023 PT Individual Time: 0919-1001 PT Individual Time Calculation (min): 42 min   Short Term Goals: Week 1:  PT Short Term Goal 1 (Week 1): =LTGs d/t ELOS  Skilled Therapeutic Interventions/Progress Updates:     Pt received seated in recliner. Some fatigue noted from previous session but pt agreeable to therapy session. Pt notes she just received muscle relaxer medications for pain management.   Pt ambulated to therapy gym with rollator and supervision assist. Seated rest break with rollator, vitals 89% SpO2 and 95bpm immediately upon sitting, recovered within 15s to 96%SpO2.   Circuit training for stair training, balance, and cardiovascular endurance: vitals maintained at 92-95% SpO2 and 106-111bpm HR, seated rest break between each exercise - 3x8 steps with SBA, 1st round with LUE support on rail, 2nd and 3rd rounds with BUE support on single rail/facing step d/t L shoulder pain. Some dizziness at end of 3rd bout d/t exertion and SpO2 of 92%, recovered with seated rest break - 3x12 standing chest press with tidal tank and 2s hold, SBA and one minor self corrected LOB  Attempted overhead tidal tank press but this bothered pt's L shoulder.  Ambulated ~20' without AD to improve dynamic balance and SBA, pt noted some dizziness not correlating with SpO2 (97%), improved after seated rest break. Pt ambulated with rollator the rest of the way to room. Pt left seated in recliner with all needs in reach.   Therapy Documentation Precautions:  Precautions Precautions: Fall Recall of Precautions/Restrictions: Intact Restrictions Weight Bearing Restrictions Per Provider Order: No General:       Therapy/Group: Individual Therapy  [name] 05/07/2023, 12:36 PM

## 2023-05-07 NOTE — Progress Notes (Signed)
 Occupational Therapy Session Note  Patient Details  Name: Alyssa Blanchard MRN: [id number] Date of Birth: 04-Dec-1970  Today's Date: 05/07/2023 OT Individual Time: 7829-5621 & 1100-1200 OT Individual Time Calculation (min): 45 min & 60 min   Short Term Goals: Week 1:  OT Short Term Goal 1 (Week 1): Patient to perform toileting with Min assist and AE as needed OT Short Term Goal 2 (Week 1): Patient to perform shower transfer with Min assist OT Short Term Goal 3 (Week 1): Patient to perform LB dressing with AE set up assist  Skilled Therapeutic Interventions/Progress Updates:      Therapy Documentation Precautions:  Precautions Precautions: Fall Recall of Precautions/Restrictions: Intact Restrictions Weight Bearing Restrictions Per Provider Order: No Session 1 General: "Hi girl!" Pt seated in W/C upon OT arrival, agreeable to OT.  Pain: unrated Lt shoulder pain reported, activity, intermittent rest breaks, distractions provided for pain management, pt reports tolerable to proceed.   ADL: Pt completed grooming, oral hygiene and hair care seated in W/C at distant supervision. Pt then ambulated from room><day room with supervision with rollator and rest break once arrived in therapy gym, VC provided for "in through nose and out through mouth" breathing for increased energy during ambulation.  Exercises: Pt issued UE theraband HEP in order to increase functional strength, andurance and activity tolerance in order to increase independence in ADLs such as bathing. Pt issued yellow theraband and discussed direction/technique of exercises, demonstrating verbal understanding. Pt completed 3x10 exercises at bed level listed below: -elbow extensions - shoulder horizontal abduction -bicep curls  -shoulder flexion -diagonal shoulder flexion -external rotation  Other Treatments: OT educated pt on pursed lip breathing for O2 management and energy conservation.   Pt seated in recliner at end  of session with W/C alarm donned, call light within reach and 4Ps assessed.    Session 2 "I feel so much better" Pt seated in recliner upon OT arrival, agreeable to OT.   Pain: unrated Lt shoulder pain reported, activity, intermittent rest breaks, distractions provided for pain management, pt reports tolerable to proceed.   ADL: Pt completed full ADL this date with increased grading difficulty with pt completing ambulation around room with no AD with no LOB/SOB. Pt completed full ADL with supervision overall with PRN rest breaks for increased cardiovascular endurance. Pt re-iterated education on taking breaks before being exhausted in order to increase activity tolerance.    Pt seated in recliner at end of session with W/C alarm donned, call light within reach and 4Ps assessed.    Therapy/Group: Individual Therapy  Velia Meyer, OTD, OTR/L 05/07/2023, 12:58 PM

## 2023-05-08 LAB — BASIC METABOLIC PANEL
Anion gap: 10 (ref 5–15)
BUN: 10 mg/dL (ref 6–20)
CO2: 26 mmol/L (ref 22–32)
Calcium: 8.9 mg/dL (ref 8.9–10.3)
Chloride: 98 mmol/L (ref 98–111)
Creatinine, Ser: 0.69 mg/dL (ref 0.44–1.00)
GFR, Estimated: >60 mL/min (ref >60)
Glucose, Bld: 159 mg/dL — ABNORMAL HIGH (ref 70–99)
Potassium: 3.4 mmol/L — ABNORMAL LOW (ref 3.5–5.1)
Sodium: 134 mmol/L — ABNORMAL LOW (ref 135–145)

## 2023-05-08 LAB — GLUCOSE, CAPILLARY
Glucose-Capillary: 130 mg/dL — ABNORMAL HIGH (ref 70–99)
Glucose-Capillary: 137 mg/dL — ABNORMAL HIGH (ref 70–99)
Glucose-Capillary: 152 mg/dL — ABNORMAL HIGH (ref 70–99)
Glucose-Capillary: 148 mg/dL — ABNORMAL HIGH (ref 70–99)

## 2023-05-08 MED ORDER — SORBITOL 70 % SOLN
60.0000 mL | Freq: Once | Status: AC
  Administered 2023-05-08: 60 mL via ORAL
  Filled 2023-05-08: qty 60

## 2023-05-08 MED ORDER — POTASSIUM CHLORIDE CRYS ER 20 MEQ PO TBCR
40.0000 meq | EXTENDED_RELEASE_TABLET | Freq: Once | ORAL | Status: AC
  Administered 2023-05-08: 40 meq via ORAL
  Filled 2023-05-08: qty 2

## 2023-05-08 MED ORDER — DOXYCYCLINE HYCLATE 100 MG PO TABS
100.0000 mg | ORAL_TABLET | Freq: Two times a day (BID) | ORAL | Status: DC
  Administered 2023-05-08 – 2023-05-12 (×9): 100 mg via ORAL
  Filled 2023-05-08 (×9): qty 1

## 2023-05-08 NOTE — Progress Notes (Signed)
 Patient requested to do the enema at night time instead.

## 2023-05-08 NOTE — Group Note (Signed)
 Patient Details Name: Tallie Dodds MRN: [id number] DOB: Feb 19, 1971 Today's Date: 05/08/2023  Time Calculation: OT Group Time Calculation OT Group Start Time: 1100 OT Group Stop Time: 1200 OT Group Time Calculation (min): 60 min      Group Description: Pt participated in group "drumming" session with a focus on BUE strength, endurance, motor planning,and activity tolerance  to facilitate improved activity tolerance and strength for higher level BADLs and functional mobility tasks.  Group members were guided through various gross motor movements while holding drum sticks in BUEs. Emphasis on group interaction with pts encouraged to select song choices in order to promote group interaction.   Pain: No pain reported during session however pt taking rest break as needed  Barron Schmid 05/08/2023, 12:23 PM

## 2023-05-08 NOTE — Progress Notes (Signed)
 Occupational Therapy Session Note  Patient Details  Name: Alyssa Blanchard MRN: [id number] Date of Birth: [birth year]  Today's Date: 05/08/2023 Session 1 OT Individual Time: 1300-1400 OT Individual Time Calculation (min): 60 min   Session 2 OT Individual Time: 1500-1540 OT Individual Time Calculation (min): 40 min    Short Term Goals: Week 1:  OT Short Term Goal 1 (Week 1): Patient to perform toileting with Min assist and AE as needed OT Short Term Goal 2 (Week 1): Patient to perform shower transfer with Min assist OT Short Term Goal 3 (Week 1): Patient to perform LB dressing with AE set up assist  Skilled Therapeutic Interventions/Progress Updates:    Session 1 Pt greeted semi-reclined in bed awake and agreeable to OT Treatment session. Pt reported stomach pain from needing to have a BM but unsuccessful. Worked on functional endurance ambulating with rollator and supervision to outside. Pt took seated rest break, then worked on ambulating on uneven surfaces and on incline. Educated on usig brake system on rollator when ambulating downhill. Practiced going up a down stairs outside in simulated home environment. Pt needed extended rest break, then ambulated uphill with increased time and supervision w/ rollator. Pt reported feeling very faint and stopped talking as much. Pt took seated rest break and needed OT to push her back to room in rollator. Pt completed stand-pivot back to bed and returned to supine. Pt reported feeling flushed and faint, but improved once in supine. Team notified. Pt left semi-reclined in bed with bed alarm on, call bell in reach, and needs met.  Pain: Pain Assessment Pain Scale: 0-10 Pain Score: 7  Pain Type: Acute pain Pain Location: Generalized Pain Descriptors / Indicators: Discomfort Pain Frequency: Intermittent Pain Intervention(s): Repositioned  Session 2 Pt greeted semi-reclined in bed and agreeable to OT treatment session. Pt completed bed mobility  and orthostatics taken as described in detail below.  Sitting: 116/68 Standing: 112/96 Standing after 2 mins 116/ 69 After ambulating 118/70   Pt ambulated to therapy gym with rollator and supervision. We discussed dc plan, home BADL tasks, and self-care retraining. Pt ambulated back to room in similar fashion and left semi-reclined in bed with bed alarm on, call bell in reach, and needs met.   Therapy Documentation Precautions:  Precautions Precautions: Fall Recall of Precautions/Restrictions: Intact Restrictions Weight Bearing Restrictions Per Provider Order: No Pain: Pain Assessment Pain Scale: 0-10 Pain Score: 7  Pain Type: Acute pain Pain Location: Generalized Pain Descriptors / Indicators: Discomfort Pain Frequency: Intermittent Pain Intervention(s): Repositioned   Therapy/Group: Individual Therapy  Mal Amabile 05/08/2023, 4:01 PM

## 2023-05-08 NOTE — Progress Notes (Signed)
 PROGRESS NOTE   Subjective/Complaints:   Pt reports weight 383.2 lbs- down from 384.7 lbs-  Has two TINY BM's. But nothing else- feels SO constipated. Willing to do enema now so will order- will try to make it to end of therapy, but if not, will do enema earlier.  Also reports boil/hidrenitis on side of R breast- that "exploded" this AM. And bled everywhere- wants to start Doxycycline, which is what she normally takes to prevent coming back or filling up again.    ROS:    Pt denies SOB, abd pain, CP, N/V/ (+) severe C/D, and vision changes   Feels swollen (+)- less so    Objective:   No results found.  Recent Labs    05/05/23 2107  WBC 7.9  HGB 10.4*  HCT 35.4*  PLT 201    Recent Labs    05/08/23 0658  NA 134*  K 3.4*  CL 98  CO2 26  GLUCOSE 159*  BUN 10  CREATININE 0.69  CALCIUM 8.9     Intake/Output Summary (Last 24 hours) at 05/08/2023 1010 Last data filed at 05/07/2023 1818 Gross per 24 hour  Intake 477 ml  Output --  Net 477 ml        Physical Exam: Vital Signs Blood pressure (!) 158/70, pulse 83, temperature 98.2 F (36.8 C), temperature source Oral, resp. rate (!) 22, height 5\' 5"  (1.651 m), weight (!) 173.8 kg, last menstrual period 12/20/2017, SpO2 95%.         General: awake, alert, appropriate, sitting up in bed; NAD HENT: conjugate gaze; oropharynx moist CV: regular rate; no JVD Pulmonary: CTA B/L; no W/R/R- good air movement GI: soft, NT, ND, (+)BS Psychiatric: appropriate Neurological: Ox3 Skin- popped small abscess on bottom of R breast- with a lot of bruising surrounding like has been popped as well as splatters of blood on gown and pillow MSK- L shoulder trigger points palpated- are better today Extremities: no LE edema palpated at all- - looks good; not seen in hands- again in spite of K tape being removed  Reports altered sensation medial left  hand Musculoskeletal:     Cervical back: Neck supple.     Moving all 4 extremities to gravity and resistance  TTP L periscapular muscles Tinels at elbow- equivocal Mild L trap pain with L shoulder abduction and ROM Improved somewhat- wearing lidoderm patch on posterior L shoulder  Skin:    General: Skin is warm.     Comments: no LE edema B/L Some eczema- not bad currently Heels dried/cracked    Assessment/Plan: 1. Functional deficits which require 3+ hours per day of interdisciplinary therapy in a comprehensive inpatient rehab setting. Physiatrist is providing close team supervision and 24 hour management of active medical problems listed below. Physiatrist and rehab team continue to assess barriers to discharge/monitor patient progress toward functional and medical goals  Care Tool:  Bathing    Body parts bathed by patient: Right arm, Left arm, Chest, Abdomen, Front perineal area, Left upper leg, Right upper leg, Face   Body parts bathed by helper: Right lower leg, Left lower leg, Buttocks     Bathing assist Assist Level: Moderate  Assistance - Patient 50 - 74%     Upper Body Dressing/Undressing Upper body dressing   What is the patient wearing?: Pull over shirt    Upper body assist Assist Level: Set up assist    Lower Body Dressing/Undressing Lower body dressing      What is the patient wearing?: Underwear/pull up, Pants     Lower body assist Assist for lower body dressing: Maximal Assistance - Patient 25 - 49%     Toileting Toileting    Toileting assist Assist for toileting: Contact Guard/Touching assist     Transfers Chair/bed transfer  Transfers assist     Chair/bed transfer assist level: Contact Guard/Touching assist     Locomotion Ambulation   Ambulation assist      Assist level: Contact Guard/Touching assist Assistive device: Walker-rolling Max distance: 244 ft   Walk 10 feet activity   Assist     Assist level: Contact  Guard/Touching assist Assistive device: Walker-rolling   Walk 50 feet activity   Assist    Assist level: Contact Guard/Touching assist Assistive device: Walker-rolling    Walk 150 feet activity   Assist    Assist level: Contact Guard/Touching assist Assistive device: Walker-rolling    Walk 10 feet on uneven surface  activity   Assist Walk 10 feet on uneven surfaces activity did not occur: Safety/medical concerns         Wheelchair     Assist Is the patient using a wheelchair?: Yes (transport only) Type of Wheelchair: Manual    Wheelchair assist level: Dependent - Patient 0% Max wheelchair distance: 150    Wheelchair 50 feet with 2 turns activity    Assist        Assist Level: Dependent - Patient 0%   Wheelchair 150 feet activity     Assist      Assist Level: Dependent - Patient 0%   Blood pressure (!) 158/70, pulse 83, temperature 98.2 F (36.8 C), temperature source Oral, resp. rate (!) 22, height 5\' 5"  (1.651 m), weight (!) 173.8 kg, last menstrual period 12/20/2017, SpO2 95%.  Medical Problem List and Plan: 1. Functional deficits secondary to debility related to acute on chronic diastolic congestive heart failure with exacerbation- diuresed significant weight             -patient may  shower             -ELOS/Goals: 5-7 days mod I- doesn't have help at home  D/c 3/3  Con't CIR PT and OT Will need nebs to go home with  Full of stool 2.  Antithrombotics: -DVT/anticoagulation:  Pharmaceutical: Eliquis             -antiplatelet therapy: N/A 3. Pain Management: Voltaren gel twice daily, Lyrica 200 mg twice daily, hydrocodone as needed  2/21- pain usually controlled- con't regimen  -2/23 check C spine xray due to shooting pain reported down L arm.  2/24- xray shows some degenerative changes, but nothing to explain L side specifically.   -her Robaxin extremely helpful  2/26- will add Kpad per pt/nursing- will do trP injections  tomorrow  2/27- kpad helpful- will do trp injections today  2/28- so much better today 4. Mood/Behavior/Sleep: Melatonin 10 mg nightly             -antipsychotic agents: N/A 5. Neuropsych/cognition: This patient is capable of making decisions on her own behalf. 6. Skin/Wound Care: Routine skin checks 7. Fluids/Electrolytes/Nutrition: Routine in and outs with follow-up chemistries 8.  PAF/A-fib with  RVR.  Cardioversion 04/28/2023.  Cardiac rate controlled.  Toprol-XL 75 mg daily  2/25- sounds regular- con't regimen 9.  E. coli UTI.  Completed course of Rocephin 10.  Right thigh cellulitis.  Completed course of Rocephin 11.  Gastroenteritis secondary to norovirus/diarrhea.  Improving.  Supportive care..  Enteric precautions- no Sx's for 4 days, can stop if OK with Infection prevention  2/21- can stop since been 5 days since last Sx. Actually is constipated now  -2/23 LBM today. Pt with nausea today.  Check abd xray. Will order 4mg  zofran- discussed with pharmacy(appreciate assistance) as pt has intermittent elevated QT. QTC not elevated on last EKG. Check labs and replete Mg for goal 2.0 ,K+ for goal above 4. If she needs more than one dose consider EKG tomorrow.   Check orthostatic vital signs.   2/24- no nausea- had regular Bms x2 yesterday 12.  Asthma.  Continue inhalers as directed. check oxygen saturations every shift  2/27- needed nebs yesterday- will send home with nebs albuterol.  13.  OSA.  CPAP 14.  Acute on chronic CHF exacerbation.  Monitor for any signs of fluid overload.  Continue Bumex 1 mg twice daily, Aldactone 25 mg daily  2/21- weight up 4 kg- however bed was changed- will monitor trend- no LE edema  2/22 Will adjust Bumex timing to 3pm  -2/23 Wt a lower today, recheck renal panel  2/24- Weight looking better- is still diuresing  Filed Weights   05/07/23 0500 05/07/23 1100 05/08/23 0500  Weight: (!) 174.1 kg (!) 174.5 kg (!) 173.8 kg    2/25- Weight about the same, but  pt feels more swollen- in hands and feet- will try 1 mg Bumex in addition to 1 mg BID  2/26- Will wait on bumex- weight down 4 kg- she didn't pee as much yesterday during day, but did last night- no signs of edema on exam- see how things go with weight in next 1 day.   2/27- so far looks like weight  might be up- since in bed- comparing to weight in bed yesterday, up 3 kg- will give afternoon Bumex if continues tomorrow-   2/27- weight bette-r will hold off more Bumex 15.  Acute on chronic anemia.  Niferex daily.  Check CBC 16.  Diabetes mellitus.  Latest hemoglobin A1c 6.7.  Presently on SSI.  Patient on Ozempic prior to admission.  2/21- BG's 109-229- only 1 value over 200- will see if family cane bring in Ozempic  2/23 fair control CBGs, continue to monitor   2/27-2/28 Fair control- family to bring in Ozempic. -she said will wait til gets home CBG (last 3)  Recent Labs    05/07/23 1658 05/07/23 2048 05/08/23 0626  GLUCAP 135* 141* 130*    17.  Obesity.  BMI 62.77.  Dietary follow-up  2/21- weight 178.8 kg- up a lot form yesterday but has different bed- will monitor trend  2/24- weight back down- to 172.6 kg- which is weight she came over at.   2/25- Weight up 5 lbs since low of what she came over at- will do 1 mg Bumex x1 18. Eczema- usually takes Dupixant- will see if can restart. Pt can bring in from home 19. Constipation- LBM 4 days ago- wants to wait on Sorbitol  2/21- will give Sorbitol Saturday if no BM- so far it's been 5 days- no Bariatric BSC in room.   2/22 reports large BM yesterday, continue to monitor bowel function  2/23 LBM today  2/24- 4 Bms over weekend- going well again.   2/25- LBM 2 days ago- will monitor  2/26- LBM 3 days ago- wil add Miralax- if doesn't go, tomorrow, will add Sorbitol   2/27- Will give 45 cc Sorbitol today after therapy  2/28- no signifcant results- will order soap suds enema 20. Preventative- will do vaseline for heels and change to regular  bed per pt- see if can get Zyrtec from home to take since don't have here. 21. Yeast infection -2/22 fluconazole 150mg  1x  -2/23 symptoms improved today 22. Hypokalemia  2/28- 3.4- will replete with 40 mEq x1 23. Hidrenadenitis  2/28- will start Doxycycline for 5 days 100 mg BID for this per pt request and is understandable.    I spent a total of 39   minutes on total care today- >50% coordination of care- due to  D/w nursing x2 about bowels- and prolonged d/w pt about weight, Doxycycline and bowels   LOS: 8 days A FACE TO FACE EVALUATION WAS PERFORMED  Gilmar Bua 05/08/2023, 10:10 AM

## 2023-05-08 NOTE — Progress Notes (Addendum)
 Patient ID: Alyssa Blanchard, female   DOB: December 18, 1970, 53 y.o.   MRN: [id number]  PCS referral made with [facility] CM. Once MD sings the forms will email back to her.  2;49 PM Met with pt to discuss SSD/SSI and PCS services. Will complete form for [facility] referral.

## 2023-05-08 NOTE — Progress Notes (Signed)
 Physical Therapy Session Note  Patient Details  Name: Alyssa Blanchard MRN: [id number] Date of Birth: August 09, 1970  Today's Date: 05/08/2023 PT Individual Time: 0800-0902 PT Individual Time Calculation (min): 62 min   Short Term Goals: Week 1:  PT Short Term Goal 1 (Week 1): =LTGs d/t ELOS  Skilled Therapeutic Interventions/Progress Updates:     Pt received supine in bed, agreeable to therapy focusing on energy conservation with gait, but notes need to use bathroom prior to mobility. Pt experiencing stomach pain she suspects is d/t lack of recent bowel movement as well as R knee pain from catching foot on w/c wheel last night and L forearm pain. Pt states she has already received pain medication this AM for knee and forearm - discussed movement can help with gut motility.   Pt ambulated to toilet without AD, completed seated peri-care, donned clean shirt and pants all with supervision, before ambulating to sink and standing at sink for self care activities with supervision. Discussed home set up and possibility of having chair in bathroom to take seated rest breaks - pt stated her bathroom is too small for this. Pt sat EOB with PT and SPT dependently ace-wrapped pt's legs and donned shoes and socks for time management.  Pt ambulated with rollator and SBA to [employer] and took seated rest break on rollator after ramp - verbal cues for locking rollator prior to sitting down and diaphragmatic breathing. SpO2 87% and HR 105bpm immediately upon sitting, recovered to 97% within ~30s. Discussed environmental scanning and taking rest breaks prior to obstacles that will be more difficult such as ramps as well as supported standing rest breaks before she feels short of breath. Pt also encouraged to avoid conversing while walking to help improve oxygen saturation. Pt ambulated ~150' with rollator and SBA, SpO2 maintained at 97% when pt was not conversing.   Upon returning to room, pt ambulated  another ~150' (rollator and SBA) and felt short of breath, SpO2 84% and recovered to 87% with unsupported standing rest break, up to 87% with supported standing rest break (leaning against wall) but pt reported feeling cold, clammy, and dizzy, so pt took seated rest break and SpO2 returned to 97% and sx of dizziness abated. Discussed that if pt feels short of breath but no dizziness, take supported standing rest break, but if pt feels dizzy to take seated rest break. Pt ambulated the rest of the way to room with rollator and SBA without symptoms and was supine in bed with all needs in reach and alarm on.   Therapy Documentation Precautions:  Precautions Precautions: Fall Recall of Precautions/Restrictions: Intact Restrictions Weight Bearing Restrictions Per Provider Order: No General:       Therapy/Group: Individual Therapy  [name] 05/08/2023, 3:50 PM

## 2023-05-09 LAB — GLUCOSE, CAPILLARY
Glucose-Capillary: 168 mg/dL — ABNORMAL HIGH (ref 70–99)
Glucose-Capillary: 98 mg/dL (ref 70–99)
Glucose-Capillary: 204 mg/dL — ABNORMAL HIGH (ref 70–99)
Glucose-Capillary: 123 mg/dL — ABNORMAL HIGH (ref 70–99)

## 2023-05-09 MED ORDER — MILK AND MOLASSES ENEMA
1.0000 | Freq: Every day | RECTAL | Status: DC | PRN
  Administered 2023-05-09: 240 mL via RECTAL
  Filled 2023-05-09 (×2): qty 240

## 2023-05-09 MED ORDER — GLYCERIN (LAXATIVE) 2 G RE SUPP
1.0000 | Freq: Once | RECTAL | Status: AC
  Administered 2023-05-09: 1 via RECTAL
  Filled 2023-05-09: qty 1

## 2023-05-09 MED ORDER — GLYCERIN (LAXATIVE) 2 G RE SUPP
1.0000 | RECTAL | Status: AC
  Filled 2023-05-09: qty 1

## 2023-05-09 MED ORDER — BUMETANIDE 1 MG PO TABS
1.0000 mg | ORAL_TABLET | Freq: Once | ORAL | Status: AC
  Administered 2023-05-09: 1 mg via ORAL
  Filled 2023-05-09: qty 1

## 2023-05-09 MED ORDER — LIDOCAINE HCL URETHRAL/MUCOSAL 2 % EX GEL
1.0000 | CUTANEOUS | Status: DC | PRN
  Administered 2023-05-09 – 2023-05-11 (×3): 1
  Filled 2023-05-09 (×4): qty 6

## 2023-05-09 NOTE — Progress Notes (Signed)
 PROGRESS NOTE   Subjective/Complaints:  Patient is still constipated, molasses enema ordered, patient would like to use after her visitors leave Weight increased by 1 lb   ROS:    Pt denies SOB, abd pain, CP, N/V/ (+) severe C/D, and vision changes   Feels swollen (+)- less so    Objective:   No results found.  No results for input(s): "WBC", "HGB", "HCT", "PLT" in the last 72 hours.   Recent Labs    05/08/23 0658  NA 134*  K 3.4*  CL 98  CO2 26  GLUCOSE 159*  BUN 10  CREATININE 0.69  CALCIUM 8.9     Intake/Output Summary (Last 24 hours) at 05/09/2023 1826 Last data filed at 05/09/2023 1800 Gross per 24 hour  Intake 712 ml  Output --  Net 712 ml        Physical Exam: Vital Signs Blood pressure (!) 111/53, pulse 79, temperature 97.9 F (36.6 C), temperature source Oral, resp. rate 18, height 5\' 5"  (1.651 m), weight (!) 174.3 kg, last menstrual period 12/20/2017, SpO2 94%. General: awake, alert, appropriate, sitting up in bed; NAD HENT: conjugate gaze; oropharynx moist CV: regular rate; no JVD Pulmonary: CTA B/L; no W/R/R- good air movement GI: soft, NT, ND, (+)BS Psychiatric: appropriate Neurological: Ox3 Skin- popped small abscess on bottom of R breast- with a lot of bruising surrounding like has been popped as well as splatters of blood on gown and pillow MSK- L shoulder trigger points palpated- are better today Extremities: no LE edema palpated at all- - looks good; not seen in hands- again in spite of K tape being removed  Reports altered sensation medial left hand Musculoskeletal:     Cervical back: Neck supple.     Moving all 4 extremities to gravity and resistance  TTP L periscapular muscles Tinels at elbow- equivocal Mild L trap pain with L shoulder abduction and ROM Improved somewhat- wearing lidoderm patch on posterior L shoulder, stable 3/1  Skin:    General: Skin is warm.      Comments: no LE edema B/L Some eczema- not bad currently Heels dried/cracked,     Assessment/Plan: 1. Functional deficits which require 3+ hours per day of interdisciplinary therapy in a comprehensive inpatient rehab setting. Physiatrist is providing close team supervision and 24 hour management of active medical problems listed below. Physiatrist and rehab team continue to assess barriers to discharge/monitor patient progress toward functional and medical goals  Care Tool:  Bathing    Body parts bathed by patient: Right arm, Left arm, Chest, Abdomen, Front perineal area, Left upper leg, Right upper leg, Face   Body parts bathed by helper: Right lower leg, Left lower leg, Buttocks     Bathing assist Assist Level: Moderate Assistance - Patient 50 - 74%     Upper Body Dressing/Undressing Upper body dressing   What is the patient wearing?: Pull over shirt    Upper body assist Assist Level: Set up assist    Lower Body Dressing/Undressing Lower body dressing      What is the patient wearing?: Underwear/pull up, Pants     Lower body assist Assist for lower body dressing: Maximal  Assistance - Patient 25 - 49%     Toileting Toileting    Toileting assist Assist for toileting: Contact Guard/Touching assist     Transfers Chair/bed transfer  Transfers assist     Chair/bed transfer assist level: Contact Guard/Touching assist     Locomotion Ambulation   Ambulation assist      Assist level: Contact Guard/Touching assist Assistive device: Walker-rolling Max distance: 244 ft   Walk 10 feet activity   Assist     Assist level: Contact Guard/Touching assist Assistive device: Walker-rolling   Walk 50 feet activity   Assist    Assist level: Contact Guard/Touching assist Assistive device: Walker-rolling    Walk 150 feet activity   Assist    Assist level: Contact Guard/Touching assist Assistive device: Walker-rolling    Walk 10 feet on uneven  surface  activity   Assist Walk 10 feet on uneven surfaces activity did not occur: Safety/medical concerns         Wheelchair     Assist Is the patient using a wheelchair?: Yes (transport only) Type of Wheelchair: Manual    Wheelchair assist level: Dependent - Patient 0% Max wheelchair distance: 150    Wheelchair 50 feet with 2 turns activity    Assist        Assist Level: Dependent - Patient 0%   Wheelchair 150 feet activity     Assist      Assist Level: Dependent - Patient 0%   Blood pressure (!) 111/53, pulse 79, temperature 97.9 F (36.6 C), temperature source Oral, resp. rate 18, height 5\' 5"  (1.651 m), weight (!) 174.3 kg, last menstrual period 12/20/2017, SpO2 94%.  Medical Problem List and Plan: 1. Functional deficits secondary to debility related to acute on chronic diastolic congestive heart failure with exacerbation- diuresed significant weight             -patient may  shower             -ELOS/Goals: 5-7 days mod I- doesn't have help at home  D/c 3/3  Con't CIR PT and OT Will need nebs to go home with  Full of stool 2.  Antithrombotics: -DVT/anticoagulation:  Pharmaceutical: Eliquis             -antiplatelet therapy: N/A 3. Pain Management: Voltaren gel twice daily, Lyrica 200 mg twice daily, hydrocodone as needed  2/21- pain usually controlled- con't regimen  -2/23 check C spine xray due to shooting pain reported down L arm.  2/24- xray shows some degenerative changes, but nothing to explain L side specifically.   -her Robaxin extremely helpful  2/26- will add Kpad per pt/nursing- will do trP injections tomorrow  2/27- kpad helpful- will do trp injections today  2/28- so much better today 4. Mood/Behavior/Sleep: Melatonin 10 mg nightly             -antipsychotic agents: N/A 5. Neuropsych/cognition: This patient is capable of making decisions on her own behalf. 6. Skin/Wound Care: Routine skin checks 7. Fluids/Electrolytes/Nutrition:  Routine in and outs with follow-up chemistries 8.  PAF/A-fib with RVR.  Cardioversion 04/28/2023.  Cardiac rate controlled.  Toprol-XL 75 mg daily  2/25- sounds regular- con't regimen 9.  E. coli UTI.  Completed course of Rocephin 10.  Right thigh cellulitis.  Completed course of Rocephin 11.  Gastroenteritis secondary to norovirus/diarrhea.  Improving.  Supportive care..  Enteric precautions- no Sx's for 4 days, can stop if OK with Infection prevention  2/21- can stop since been 5 days  since last Sx. Actually is constipated now  -2/23 LBM today. Pt with nausea today.  Check abd xray. Will order 4mg  zofran- discussed with pharmacy(appreciate assistance) as pt has intermittent elevated QT. QTC not elevated on last EKG. Check labs and replete Mg for goal 2.0 ,K+ for goal above 4. If she needs more than one dose consider EKG tomorrow.   Check orthostatic vital signs.   2/24- no nausea- had regular Bms x2 yesterday 12.  Asthma.  Continue inhalers as directed. check oxygen saturations every shift  2/27- needed nebs yesterday- will send home with nebs albuterol.  13.  OSA.  CPAP 14.  Acute on chronic CHF exacerbation.  Monitor for any signs of fluid overload.  Continue Bumex 1 mg twice daily, Aldactone 25 mg daily  2/21- weight up 4 kg- however bed was changed- will monitor trend- no LE edema  2/22 Will adjust Bumex timing to 3pm  -2/23 Wt a lower today, recheck renal panel  2/24- Weight looking better- is still diuresing  Filed Weights   05/07/23 1100 05/08/23 0500 05/09/23 0453  Weight: (!) 174.5 kg (!) 173.8 kg (!) 174.3 kg    2/25- Weight about the same, but pt feels more swollen- in hands and feet- will try 1 mg Bumex in addition to 1 mg BID  2/26- Will wait on bumex- weight down 4 kg- she didn't pee as much yesterday during day, but did last night- no signs of edema on exam- see how things go with weight in next 1 day.   2/27- so far looks like weight  might be up- since in bed- comparing to  weight in bed yesterday, up 3 kg- will give afternoon Bumex if continues tomorrow-   3/1 additional 1mg  Bumex ordered  15.  Acute on chronic anemia.  Niferex daily.  Check CBC 16.  Diabetes mellitus.  Latest hemoglobin A1c 6.7.  Presently on SSI.  Patient on Ozempic prior to admission.  2/21- BG's 109-229- only 1 value over 200- will see if family cane bring in Ozempic  2/23 fair control CBGs, continue to monitor   2/27-2/28 Fair control- family to bring in Ozempic. -she said will wait til gets home CBG (last 3)  Recent Labs    05/09/23 0609 05/09/23 1131 05/09/23 1632  GLUCAP 168* 98 204*    17.  Obesity.  BMI 62.77.  Dietary follow-up  2/21- weight 178.8 kg- up a lot form yesterday but has different bed- will monitor trend  2/24- weight back down- to 172.6 kg- which is weight she came over at.   2/25- Weight up 5 lbs since low of what she came over at- will do 1 mg Bumex x1 18. Eczema- usually takes Dupixant- will see if can restart. Pt can bring in from home 19. Constipation- LBM 4 days ago- wants to wait on Sorbitol  2/21- will give Sorbitol Saturday if no BM- so far it's been 5 days- no Bariatric BSC in room.   2/22 reports large BM yesterday, continue to monitor bowel function  2/23 LBM today  2/24- 4 Bms over weekend- going well again.   2/25- LBM 2 days ago- will monitor  2/26- LBM 3 days ago- wil add Miralax- if doesn't go, tomorrow, will add Sorbitol   2/27- Will give 45 cc Sorbitol today after therapy  3/1 milk of molasses enema ordered  20. Preventative- will do vaseline for heels and change to regular bed per pt- see if can get Zyrtec from home to  take since don't have here. 21. Yeast infection -2/22 fluconazole 150mg  1x  -2/23 symptoms improved today 22. 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       LOS: 9 days A  FACE TO FACE EVALUATION WAS PERFORMED  Horton Chin 05/09/2023, 6:26 PM

## 2023-05-09 NOTE — Progress Notes (Signed)
 Pt was not able to tolerate enema. Pt wanted another dose of sorbitol. Oncall Mercedes called and informed about the pts concerns. 1 time dose of 60ml ordered for pt as well as instructed to give prn senokot. Pt had a small BM but is still having c/o discomfort. No further concerns at this time. Call bell in reach, bed alarm on   Feliberto Gottron, LPN

## 2023-05-10 ENCOUNTER — Inpatient Hospital Stay (HOSPITAL_COMMUNITY)

## 2023-05-10 LAB — GLUCOSE, CAPILLARY
Glucose-Capillary: 119 mg/dL — ABNORMAL HIGH (ref 70–99)
Glucose-Capillary: 123 mg/dL — ABNORMAL HIGH (ref 70–99)
Glucose-Capillary: 190 mg/dL — ABNORMAL HIGH (ref 70–99)
Glucose-Capillary: 134 mg/dL — ABNORMAL HIGH (ref 70–99)

## 2023-05-10 MED ORDER — GLYCERIN (LAXATIVE) 2 G RE SUPP
1.0000 | RECTAL | Status: DC | PRN
Start: 2023-05-10 — End: 2023-05-12

## 2023-05-10 MED ORDER — ACETAMINOPHEN 325 MG PO TABS: 650.0000 mg | ORAL_TABLET | Freq: Four times a day (QID) | ORAL | Status: AC | PRN

## 2023-05-10 MED ORDER — LORAZEPAM 0.5 MG PO TABS
0.5000 mg | ORAL_TABLET | Freq: Four times a day (QID) | ORAL | Status: DC | PRN
  Administered 2023-05-11 (×3): 0.5 mg via ORAL
  Filled 2023-05-10 (×3): qty 1

## 2023-05-10 MED ORDER — LORAZEPAM 0.5 MG PO TABS
0.5000 mg | ORAL_TABLET | Freq: Once | ORAL | Status: AC
  Administered 2023-05-10: 0.5 mg via ORAL
  Filled 2023-05-10: qty 1

## 2023-05-10 MED ORDER — MAGNESIUM CITRATE PO SOLN
1.0000 | Freq: Once | ORAL | Status: AC
  Administered 2023-05-10: 1 via ORAL
  Filled 2023-05-10: qty 296

## 2023-05-10 NOTE — Progress Notes (Signed)
 Patient continues to report pain in rectal area, difficult to tolerate disimpactions, enema or suppository insertion. Glycerin suppository administered x1 and disimpaction attempted with multiple rest breaks and lidociane/lubricant assist. Small amount of hard stool removed. Molasses enema administered per order but only able to administer about half before patient unable to tolerate and requesting to stop. No results from that. [address] PA made aware and new orders received. Patient up to toilet to void but reports feeling" I have a lot more in there that wont come out". Patient bladder scanned for 653 and after discussion agreeable to cath. Patient cathed for of clear yellow urine. Patient remains intermittently tearful and emotional through out the procedures. Emotional support provided. [address] PA made aware and ativan administered per order. 2nd glycerin suppository not administered at this time per pt request.

## 2023-05-10 NOTE — Progress Notes (Signed)
 During initial rounds discussed option with patient regarding bowel management. Patient reported being open to trying glycerin suppository again. [address] [place] notified and new order received. Patient able to have another small hard bowel movement on toilet. Patient later declined suppository assist due to rectal pain. Education on disimpaction and relief interventions reinforced.

## 2023-05-10 NOTE — Progress Notes (Signed)
 Occupational Therapy Discharge Summary  Patient Details  Name: Alyssa Blanchard MRN: [id number] Date of Birth: 09/05/1970  Date of Discharge from OT service:May 12, 2023  Today's Date: 05/10/2023 OT Individual Time: 1100-1200 OT Individual Time Calculation (min): 60 min    Patient has met 9 of 9 long term goals due to improved activity tolerance, improved balance, postural control, and ability to compensate for deficits. Patient to discharge at overall Modified Independent level.  Patient's care partner is independent to provide the necessary  intermittent supervision  assistance at discharge.    Reasons goals not met: N/A  Recommendation:  Patient will benefit from ongoing skilled OT services in home health setting to continue to advance functional skills in the area of BADL, iADL, and Reduce care partner burden.  Equipment: rollator  Reasons for discharge: treatment goals met and discharge from hospital  Patient/family agrees with progress made and goals achieved: Yes  OT Discharge Precautions/Restrictions  Precautions Precautions: Fall Restrictions Weight Bearing Restrictions Per Provider Order: No Pain Pain Assessment Pain Scale: 0-10 Pain Score: 7  Pain Type: Acute pain Pain Location:  (stomach and buttocks) Pain Onset: On-going Pain Intervention(s): Medication (See eMAR) ADL ADL Eating: Independent Where Assessed-Eating: Bed level Grooming: Modified independent Where Assessed-Grooming: Sitting at sink, Standing at sink Upper Body Bathing: Modified independent Where Assessed-Upper Body Bathing: Shower Lower Body Bathing: Modified independent Where Assessed-Lower Body Bathing: Shower Upper Body Dressing: Modified independent (Device) Where Assessed-Upper Body Dressing: Edge of bed Lower Body Dressing: Modified independent Where Assessed-Lower Body Dressing: Standing at sink, Sitting at sink Toileting: Modified independent Where Assessed-Toileting:  Teacher, adult education: Modified Community education officer Method: Insurance claims handler: Modified independent Web designer Method: Ship broker: Emergency planning/management officer, Walk in Electrical engineer Transfer: Modified independent Film/video editor Method: Designer, industrial/product: Emergency planning/management officer ADL Comments: increased activity tolerance noted at D/C with decreased number of rest breaks needed. Pt also with increased carryover of energy conservation techniques Vision Baseline Vision/History: 1 Wears glasses Patient Visual Report: No change from baseline Vision Assessment?: No apparent visual deficits Perception  Perception: Within Functional Limits Praxis Praxis: WFL Cognition Cognition Overall Cognitive Status: Within Functional Limits for tasks assessed Arousal/Alertness: Awake/alert Orientation Level: Person;Place;Situation Person: Oriented Place: Oriented Situation: Oriented Memory: Appears intact Awareness: Appears intact Problem Solving: Appears intact Safety/Judgment: Appears intact Brief Interview for Mental Status (BIMS) Repetition of Three Words (First Attempt): 3 Temporal Orientation: Year: Correct Temporal Orientation: Month: Accurate within 5 days Temporal Orientation: Day: Correct Recall: "Sock": Yes, no cue required Recall: "Blue": Yes, no cue required Recall: "Bed": Yes, no cue required BIMS Summary Score: 15 Sensation Sensation Light Touch: Appears Intact Hot/Cold: Appears Intact Proprioception: Appears Intact Stereognosis: Appears Intact Coordination Gross Motor Movements are Fluid and Coordinated: Yes Fine Motor Movements are Fluid and Coordinated: Yes Coordination and Movement Description: Lt shoulder pain, managed with K-taping and heat although limiting some ROM Motor  Motor Motor: Within Functional Limits Mobility  Bed Mobility Bed Mobility: Supine to Sit Right Sidelying to Sit:  Independent with assistive device Supine to Sit: Independent with assistive device Transfers Sit to Stand: Independent with assistive device  Trunk/Postural Assessment  Cervical Assessment Cervical Assessment: Within Functional Limits Thoracic Assessment Thoracic Assessment: Within Functional Limits Lumbar Assessment Lumbar Assessment: Within Functional Limits Postural Control Postural Control: Within Functional Limits  Balance Balance Balance Assessed: Yes Static Sitting Balance Static Sitting - Balance Support: Feet supported Static Sitting - Level of Assistance: 6: Modified independent (Device/Increase time)  Dynamic Sitting Balance Dynamic Sitting - Balance Support: During functional activity Dynamic Sitting - Level of Assistance: 6: Modified independent (Device/Increase time) Dynamic Sitting - Balance Activities: Forward lean/weight shifting;Reaching for objects Static Standing Balance Static Standing - Balance Support: During functional activity Static Standing - Level of Assistance: 6: Modified independent (Device/Increase time) Dynamic Standing Balance Dynamic Standing - Balance Support: During functional activity Dynamic Standing - Level of Assistance: 6: Modified independent (Device/Increase time) Dynamic Standing - Balance Activities: Lateral lean/weight shifting;Reaching for objects;Reaching across midline;Reaching for weighted objects Extremity/Trunk Assessment RUE Assessment RUE Assessment: Within Functional Limits General Strength Comments: 5/5 grossly LUE Assessment LUE Assessment: Exceptions to [facility] Active Range of Motion (AROM) Comments: limited shoulder flexion ~100*, has recieved trigger point injections with reported decreased pain   Velia Meyer, OTD, OTR/L 05/12/2023, 5:02 PM

## 2023-05-10 NOTE — Plan of Care (Signed)
  Problem: Consults Goal: RH GENERAL PATIENT EDUCATION Description: See Patient Education module for education specifics. Outcome: Progressing   Problem: RH BOWEL ELIMINATION Goal: RH STG MANAGE BOWEL WITH ASSISTANCE Description: STG Manage Bowel with Assistance. Outcome: Progressing   Problem: RH BLADDER ELIMINATION Goal: RH STG MANAGE BLADDER WITH ASSISTANCE Description: STG Manage Bladder With supervision Assistance Outcome: Progressing   Problem: RH SKIN INTEGRITY Goal: RH STG SKIN FREE OF INFECTION/BREAKDOWN Description:  Manage skin integrity, free from infection/ breakdown with supervision assistance   Outcome: Progressing   Problem: RH SAFETY Goal: RH STG ADHERE TO SAFETY PRECAUTIONS W/ASSISTANCE/DEVICE Description: STG Adhere to Safety Precautions With supervision  Assistance/Device. Outcome: Progressing   Problem: RH KNOWLEDGE DEFICIT GENERAL Goal: RH STG INCREASE KNOWLEDGE OF SELF CARE AFTER HOSPITALIZATION Outcome: Progressing   Problem: RH KNOWLEDGE DEFICIT GENERAL Goal: RH STG INCREASE KNOWLEDGE OF SELF CARE AFTER HOSPITALIZATION Outcome: Progressing   Problem: RH KNOWLEDGE DEFICIT GENERAL Goal: RH STG INCREASE KNOWLEDGE OF SELF CARE AFTER HOSPITALIZATION Outcome: Progressing

## 2023-05-10 NOTE — Progress Notes (Addendum)
 Physical Therapy Session Note  Patient Details  Name: Alyssa Blanchard MRN: [id number] Date of Birth: Jul 05, 1970  Today's Date: 05/10/2023 PT Individual Time: 1346-1420 PT Individual Time Calculation (min): 34 min  and Today's Date: 05/10/2023 PT Missed Time: 11 Minutes Missed Time Reason: Patient fatigue  Short Term Goals: Week 1:  PT Short Term Goal 1 (Week 1): =LTGs d/t ELOS  Skilled Therapeutic Interventions/Progress Updates:      Pt supine in bed upon arrival. Pt agreeable to therpay, however pt reports limited participation 2/2 inability to have a BM in the last 8 days. Pt reports awaiting visit from GI.  Therapist attempted to assess test, however pt reports inability to do today 2/2 abdominal discomfort.   Pt agreeable to walking to help mobilize bowel.   Pt performed bed mobility, sit to stand, and short distance ambulation in room, as well as gait x150 feet with mod I. Pt required frequent seated rest breaks on rollator 2/2 SOB/dizziness and abdominal discomfort. Pt able to recall need to take seated rest break with dizziness. Pt reports dizziness subsided with seated rest break.   Pt attemtped to use bathroom during session however unable. Pt seated EOB at end of session with all needs within reach.   Therapy Documentation Precautions:  Precautions Precautions: Fall Recall of Precautions/Restrictions: Intact Restrictions Weight Bearing Restrictions Per Provider Order: No  Therapy/Group: Individual Therapy  [facility] Ambrose Finland, [place], DPT  05/10/2023, 4:27 PM

## 2023-05-10 NOTE — Progress Notes (Signed)
 PROGRESS NOTE   Subjective/Complaints: Severely constipated, last BM 8 days ago, GI consulted, had minimal results with disimpaction, unable to tolerate enemas, 2 small BM with mag citrate today   ROS:    Pt denies SOB, abd pain, CP, N/V/ +severe constipation    Objective:   DG Abd 1 View Result Date: 05/10/2023 CLINICAL DATA:  Constipation. No bowel movement for several days. Midline abdominal pain. EXAM: ABDOMEN - 1 VIEW COMPARISON:  Lumbar spine radiographs 04/08/23. FINDINGS: Moderate stool is present at the rectum. Bowel gas pattern throughout the colon is otherwise within normal limits. No obstruction is present. Small bowel is unremarkable. No definite free air is present. IMPRESSION: 1. Moderate stool at the rectum. 2. No obstruction or other acute abnormality. Electronically Signed   By: Marin Roberts M.D.   On: 05/10/2023 12:12    No results for input(s): "WBC", "HGB", "HCT", "PLT" in the last 72 hours.   Recent Labs    05/08/23 0658  NA 134*  K 3.4*  CL 98  CO2 26  GLUCOSE 159*  BUN 10  CREATININE 0.69  CALCIUM 8.9     Intake/Output Summary (Last 24 hours) at 05/10/2023 1304 Last data filed at 05/10/2023 1018 Gross per 24 hour  Intake 594 ml  Output 2066 ml  Net -1472 ml        Physical Exam: Vital Signs Blood pressure (!) 118/59, pulse 81, temperature 97.6 F (36.4 C), temperature source Oral, resp. rate 18, height 5\' 5"  (1.651 m), weight (!) 173.2 kg, last menstrual period 12/20/2017, SpO2 95%. General: awake, alert, appropriate, sitting up in bed; NAD HENT: conjugate gaze; oropharynx moist CV: regular rate; no JVD Pulmonary: CTA B/L; no W/R/R- good air movement GI: soft, NT, ND, (+)BS Psychiatric: appropriate, tearful due to constipation Neurological: Ox3 Skin- popped small abscess on bottom of R breast- with a lot of bruising surrounding like has been popped as well as splatters of blood  on gown and pillow MSK- L shoulder trigger points palpated- are better today Extremities: no LE edema palpated at all- - looks good; not seen in hands- again in spite of K tape being removed  Reports altered sensation medial left hand Musculoskeletal:     Cervical back: Neck supple.     Moving all 4 extremities to gravity and resistance  TTP L periscapular muscles Tinels at elbow- equivocal Mild L trap pain with L shoulder abduction and ROM Improved somewhat- wearing lidoderm patch on posterior L shoulder, stable 3/1  Skin:    General: Skin is warm.     Comments: no LE edema B/L Some eczema- not bad currently Heels dried/cracked,     Assessment/Plan: 1. Functional deficits which require 3+ hours per day of interdisciplinary therapy in a comprehensive inpatient rehab setting. Physiatrist is providing close team supervision and 24 hour management of active medical problems listed below. Physiatrist and rehab team continue to assess barriers to discharge/monitor patient progress toward functional and medical goals  Care Tool:  Bathing    Body parts bathed by patient: Right arm, Left arm, Chest, Abdomen, Front perineal area, Left upper leg, Right upper leg, Face   Body parts bathed by helper:  Right lower leg, Left lower leg, Buttocks     Bathing assist Assist Level: Moderate Assistance - Patient 50 - 74%     Upper Body Dressing/Undressing Upper body dressing   What is the patient wearing?: Pull over shirt    Upper body assist Assist Level: Set up assist    Lower Body Dressing/Undressing Lower body dressing      What is the patient wearing?: Underwear/pull up, Pants     Lower body assist Assist for lower body dressing: Maximal Assistance - Patient 25 - 49%     Toileting Toileting    Toileting assist Assist for toileting: Contact Guard/Touching assist     Transfers Chair/bed transfer  Transfers assist     Chair/bed transfer assist level: Contact  Guard/Touching assist     Locomotion Ambulation   Ambulation assist      Assist level: Contact Guard/Touching assist Assistive device: Walker-rolling Max distance: 244 ft   Walk 10 feet activity   Assist     Assist level: Contact Guard/Touching assist Assistive device: Walker-rolling   Walk 50 feet activity   Assist    Assist level: Contact Guard/Touching assist Assistive device: Walker-rolling    Walk 150 feet activity   Assist    Assist level: Contact Guard/Touching assist Assistive device: Walker-rolling    Walk 10 feet on uneven surface  activity   Assist Walk 10 feet on uneven surfaces activity did not occur: Safety/medical concerns         Wheelchair     Assist Is the patient using a wheelchair?: Yes (transport only) Type of Wheelchair: Manual    Wheelchair assist level: Dependent - Patient 0% Max wheelchair distance: 150    Wheelchair 50 feet with 2 turns activity    Assist        Assist Level: Dependent - Patient 0%   Wheelchair 150 feet activity     Assist      Assist Level: Dependent - Patient 0%   Blood pressure (!) 118/59, pulse 81, temperature 97.6 F (36.4 C), temperature source Oral, resp. rate 18, height 5\' 5"  (1.651 m), weight (!) 173.2 kg, last menstrual period 12/20/2017, SpO2 95%.  Medical Problem List and Plan: 1. Functional deficits secondary to debility related to acute on chronic diastolic congestive heart failure with exacerbation- diuresed significant weight             -patient may  shower             -ELOS/Goals: 5-7 days mod I- doesn't have help at home  D/c 3/3  Con't CIR PT and OT Will need nebs to go home with  Full of stool 2.  Antithrombotics: -DVT/anticoagulation:  Pharmaceutical: Eliquis             -antiplatelet therapy: N/A 3. Pain Management: Voltaren gel twice daily, Lyrica 200 mg twice daily, hydrocodone as needed  2/21- pain usually controlled- con't regimen  -2/23 check C  spine xray due to shooting pain reported down L arm.  2/24- xray shows some degenerative changes, but nothing to explain L side specifically.   -her Robaxin extremely helpful  2/26- will add Kpad per pt/nursing- will do trP injections tomorrow  2/27- kpad helpful- will do trp injections today  2/28- so much better today 4. Mood/Behavior/Sleep: Melatonin 10 mg nightly             -antipsychotic agents: N/A 5. Neuropsych/cognition: This patient is capable of making decisions on her own behalf. 6. Skin/Wound Care: Routine  skin checks 7. Fluids/Electrolytes/Nutrition: Routine in and outs with follow-up chemistries 8.  PAF/A-fib with RVR.  Cardioversion 04/28/2023.  Cardiac rate controlled.  Toprol-XL 75 mg daily  2/25- sounds regular- con't regimen 9.  E. coli UTI.  Completed course of Rocephin 10.  Right thigh cellulitis.  Completed course of Rocephin 11.  Gastroenteritis secondary to norovirus/diarrhea.  Improving.  Supportive care..  Enteric precautions- no Sx's for 4 days, can stop if OK with Infection prevention  2/21- can stop since been 5 days since last Sx. Actually is constipated now  -2/23 LBM today. Pt with nausea today.  Check abd xray. Will order 4mg  zofran- discussed with pharmacy(appreciate assistance) as pt has intermittent elevated QT. QTC not elevated on last EKG. Check labs and replete Mg for goal 2.0 ,K+ for goal above 4. If she needs more than one dose consider EKG tomorrow.   Check orthostatic vital signs.   2/24- no nausea- had regular Bms x2 yesterday 12.  Asthma.  Continue inhalers as directed. check oxygen saturations every shift  2/27- needed nebs yesterday- will send home with nebs albuterol.  13.  OSA.  CPAP 14.  Acute on chronic CHF exacerbation.  Monitor for any signs of fluid overload.  Continue Bumex 1 mg twice daily, Aldactone 25 mg daily  2/21- weight up 4 kg- however bed was changed- will monitor trend- no LE edema  2/22 Will adjust Bumex timing to 3pm  3/2  weight significantly better  Filed Weights   05/08/23 0500 05/09/23 0453 05/10/23 0500  Weight: (!) 173.8 kg (!) 174.3 kg (!) 173.2 kg    2/25- Weight about the same, but pt feels more swollen- in hands and feet- will try 1 mg Bumex in addition to 1 mg BID  2/26- Will wait on bumex- weight down 4 kg- she didn't pee as much yesterday during day, but did last night- no signs of edema on exam- see how things go with weight in next 1 day.   2/27- so far looks like weight  might be up- since in bed- comparing to weight in bed yesterday, up 3 kg- will give afternoon Bumex if continues tomorrow-   3/1 additional 1mg  Bumex ordered  3/2: discussed that weight is much improved  15.  Acute on chronic anemia.  D/c Niferex for now given severe constipation.  Check CBC  16.  Diabetes mellitus.  Latest hemoglobin A1c 6.7.  Presently on SSI.  Patient on Ozempic prior to admission.  2/21- BG's 109-229- only 1 value over 200- will see if family cane bring in Ozempic  2/23 fair control CBGs, continue to monitor   2/27-2/28 Fair control- family to bring in Ozempic. -she said will wait til gets home CBG (last 3)  Recent Labs    05/09/23 2048 05/10/23 0553 05/10/23 1118  GLUCAP 123* 119* 123*    17.  Obesity.  BMI 62.77.  Dietary follow-up  2/21- weight 178.8 kg- up a lot form yesterday but has different bed- will monitor trend  2/24- weight back down- to 172.6 kg- which is weight she came over at.   2/25- Weight up 5 lbs since low of what she came over at- will do 1 mg Bumex x1  3/2: discussed weight decreased with Bumex 18. Eczema- usually takes Dupixant- will see if can restart. Pt can bring in from home 19. Constipation- LBM 4 days ago- wants to wait on Sorbitol  2/21- will give Sorbitol Saturday if no BM- so far it's been 5  days- no Bariatric BSC in room.   2/22 reports large BM yesterday, continue to monitor bowel function  2/23 LBM today  2/24- 4 Bms over weekend- going well again.   2/25- LBM  2 days ago- will monitor  2/26- LBM 3 days ago- wil add Miralax- if doesn't go, tomorrow, will add Sorbitol   2/27- Will give 45 cc Sorbitol today after therapy  GI consulted given severity of constipation  20. Preventative- will do vaseline for heels and change to regular bed per pt- see if can get Zyrtec from home to take since don't have here.  21. Yeast infection -2/22 fluconazole 150mg  1x  -2/23 symptoms improved today  22. 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       LOS: 10 days A FACE TO FACE EVALUATION WAS PERFORMED  Clint Bolder P Orla Jolliff 05/10/2023, 1:04 PM

## 2023-05-10 NOTE — Progress Notes (Signed)
 Physical Therapy Discharge Summary  Patient Details  Name: Alyssa Blanchard MRN: [id number] Date of Birth: [birth year]  Date of Discharge from PT service:May 11, 2023   Patient has met {NUMBERS 0-12:18577} of {NUMBERS 0-12:18577} long term goals due to improved activity tolerance, improved balance, increased strength, ability to compensate for deficits, improved attention, improved awareness, and improved coordination.  Patient to discharge at an ambulatory level Modified Independent.     Reasons goals not met: ***  Recommendation:  Patient will benefit from ongoing skilled PT services in home health setting to continue to advance safe functional mobility, address ongoing impairments in endurance, gait, balance, and minimize fall risk.  Equipment: Cabin crew  Reasons for discharge: treatment goals met and discharge from hospital  Patient/family agrees with progress made and goals achieved: Yes  PT Discharge Precautions/Restrictions Precautions Precautions: Fall Restrictions Weight Bearing Restrictions Per Provider Order: No Pain Interference Pain Interference Pain Effect on Sleep: 4. Almost constantly Pain Interference with Therapy Activities: 4. Almost constantly Pain Interference with Day-to-Day Activities: 4. Almost constantly Vision/Perception  Vision - History Ability to See in Adequate Light: 0 Adequate Perception Perception: Within Functional Limits Praxis Praxis: WFL  Cognition Overall Cognitive Status: Within Functional Limits for tasks assessed Arousal/Alertness: Awake/alert Orientation Level: Oriented X4 Memory: Appears intact Awareness: Appears intact Problem Solving: Appears intact Safety/Judgment: Appears intact Sensation Sensation Light Touch: Appears Intact Hot/Cold: Appears Intact Proprioception: Appears Intact Stereognosis: Appears Intact Coordination Gross Motor Movements are Fluid and Coordinated: Yes Fine Motor Movements are  Fluid and Coordinated: Yes Coordination and Movement Description: Lt shoulder pain, managed with K-taping and heat although limiting some ROM Motor  Motor Motor: Within Functional Limits  Mobility Bed Mobility Bed Mobility: Supine to Sit Right Sidelying to Sit: Independent with assistive device Supine to Sit: Independent with assistive device Transfers Transfers: Sit to Stand;Stand Pivot Transfers;Transfer Sit to Stand: Independent with assistive device Stand Pivot Transfers: Independent with assistive device Transfer (Assistive device): Rollator Locomotion  Gait Ambulation: Yes Gait Assistance: Independent with assistive device Gait Distance (Feet): 150 Feet Assistive device: Rolling walker Gait Gait: Yes Gait Pattern: Within Functional Limits Gait velocity: decreased Stairs / Additional Locomotion Stairs: Yes Stairs Assistance: Independent with assistive device Stair Management Technique: Two rails Number of Stairs: 8 Height of Stairs: 6 Wheelchair Mobility Wheelchair Mobility: No  Trunk/Postural Assessment  Cervical Assessment Cervical Assessment: Within Functional Limits Thoracic Assessment Thoracic Assessment: Within Functional Limits Lumbar Assessment Lumbar Assessment: Within Functional Limits Postural Control Postural Control: Within Functional Limits  Balance Balance Balance Assessed: Yes Static Sitting Balance Static Sitting - Balance Support: Feet supported Static Sitting - Level of Assistance: 6: Modified independent (Device/Increase time) Dynamic Sitting Balance Dynamic Sitting - Balance Support: During functional activity Dynamic Sitting - Level of Assistance: 6: Modified independent (Device/Increase time) Dynamic Sitting - Balance Activities: Forward lean/weight shifting;Reaching for objects Static Standing Balance Static Standing - Balance Support: During functional activity Static Standing - Level of Assistance: 6: Modified independent  (Device/Increase time) Dynamic Standing Balance Dynamic Standing - Balance Support: During functional activity Dynamic Standing - Level of Assistance: 6: Modified independent (Device/Increase time) Dynamic Standing - Balance Activities: Lateral lean/weight shifting;Reaching for objects;Reaching across midline;Reaching for weighted objects Extremity Assessment  RUE Assessment RUE Assessment: Within Functional Limits General Strength Comments: 5/5 grossly LUE Assessment LUE Assessment: Exceptions to [facility] Active Range of Motion (AROM) Comments: limited shoulder flexion ~100*, has recieved trigger point injections with reported decreased pain RLE Assessment RLE Assessment: Exceptions to [facility] General Strength Comments:  Grossly 4-/5 LLE Assessment LLE Assessment: Exceptions to [facility] General Strength Comments: Grossly 4-/5   [facility] Ambrose Finland, PT, DPT  05/10/2023, 4:39 PM

## 2023-05-10 NOTE — Progress Notes (Signed)
 Occupational Therapy Session Note  Patient Details  Name: Alyssa Blanchard MRN: [id number] Date of Birth: 1971-02-21  Today's Date: 05/10/2023 OT Individual Time: 1100-1200 OT Individual Time Calculation (min): 60 min    Short Term Goals: Week 1:  OT Short Term Goal 1 (Week 1): Patient to perform toileting with Min assist and AE as needed OT Short Term Goal 2 (Week 1): Patient to perform shower transfer with Min assist OT Short Term Goal 3 (Week 1): Patient to perform LB dressing with AE set up assist  Skilled Therapeutic Interventions/Progress Updates:      Therapy Documentation Precautions:  Precautions Precautions: Fall Recall of Precautions/Restrictions: Intact Restrictions Weight Bearing Restrictions Per Provider Order: No General: "I feel awful today" Pt supine in bed upon OT arrival, agreeable to OT session. Pt reporting team working on constipation issue with GI coming to check in on her sometime today. Pt encouraged to participate in therapy in order to increase movement for BM, pt agreeable.  Pain:  8/10 pain reported in back and stomach, activity, intermittent rest breaks, distractions provided for pain management, pt reports tolerable to proceed.   ADL:  OT encouraged pt to mobilize OOB in order to increase likelihood of BM. Pt ambulating mod I with rollator community distances with frequent seated rest breaks d/t SOB and slight nausea from stomach pain. Pt with good carryover of rollator safety from prior sessions. Pt ambulated back to room and reported wanted to try to void. Pt voided urine and 2 small pieces of stool, nsg notified and reported in I/O. Toileting completed at mod I.    Other Treatments: Pt made mod I in room, nsg notified. Pt educated on abdominal massage in order to stimulate movement in bowel in order to increase likelihood of BM.   Pt supine in bed with bed alarm activated, 2 bed rails up, call light within reach and 4Ps  assessed.   Therapy/Group: Individual Therapy  Velia Meyer, OTD, OTR/L  05/10/2023, 4:58 PM

## 2023-05-11 ENCOUNTER — Telehealth (HOSPITAL_COMMUNITY): Payer: Self-pay | Admitting: Pharmacy Technician

## 2023-05-11 ENCOUNTER — Other Ambulatory Visit (HOSPITAL_COMMUNITY): Payer: Self-pay

## 2023-05-11 DIAGNOSIS — K602 Anal fissure, unspecified: Secondary | ICD-10-CM

## 2023-05-11 DIAGNOSIS — K625 Hemorrhage of anus and rectum: Secondary | ICD-10-CM

## 2023-05-11 DIAGNOSIS — K6289 Other specified diseases of anus and rectum: Secondary | ICD-10-CM

## 2023-05-11 DIAGNOSIS — K5900 Constipation, unspecified: Secondary | ICD-10-CM

## 2023-05-11 LAB — GLUCOSE, CAPILLARY
Glucose-Capillary: 123 mg/dL — ABNORMAL HIGH (ref 70–99)
Glucose-Capillary: 137 mg/dL — ABNORMAL HIGH (ref 70–99)
Glucose-Capillary: 159 mg/dL — ABNORMAL HIGH (ref 70–99)
Glucose-Capillary: 152 mg/dL — ABNORMAL HIGH (ref 70–99)

## 2023-05-11 MED ORDER — PREGABALIN 200 MG PO CAPS
200.0000 mg | ORAL_CAPSULE | Freq: Two times a day (BID) | ORAL | 0 refills | Status: DC
  Filled 2023-05-11: qty 60, 30d supply, fill #0

## 2023-05-11 MED ORDER — SENNOSIDES-DOCUSATE SODIUM 8.6-50 MG PO TABS: 1.0000 | ORAL_TABLET | Freq: Every evening | ORAL | Status: DC | PRN

## 2023-05-11 MED ORDER — MELATONIN 5 MG PO TABS
10.0000 mg | ORAL_TABLET | Freq: Every day | ORAL | 0 refills | Status: AC
  Filled 2023-05-11: qty 60, 30d supply, fill #0

## 2023-05-11 MED ORDER — HYDROCODONE-ACETAMINOPHEN 10-325 MG PO TABS
1.0000 | ORAL_TABLET | Freq: Four times a day (QID) | ORAL | 0 refills | Status: DC | PRN
  Filled 2023-05-11: qty 30, 4d supply, fill #0

## 2023-05-11 MED ORDER — DILTIAZEM GEL 2 %: Freq: Three times a day (TID) | CUTANEOUS | Status: DC

## 2023-05-11 MED ORDER — SILVER SULFADIAZINE 1 % EX CREA
TOPICAL_CREAM | Freq: Every day | CUTANEOUS | 0 refills | Status: AC
  Filled 2023-05-11: qty 50, 30d supply, fill #0

## 2023-05-11 MED ORDER — LORAZEPAM 0.5 MG PO TABS
0.5000 mg | ORAL_TABLET | Freq: Four times a day (QID) | ORAL | 0 refills | Status: DC | PRN
  Filled 2023-05-11: qty 10, 4d supply, fill #0

## 2023-05-11 MED ORDER — DOCUSATE SODIUM 100 MG PO CAPS
200.0000 mg | ORAL_CAPSULE | Freq: Every day | ORAL | Status: DC
  Administered 2023-05-11: 200 mg via ORAL
  Filled 2023-05-11: qty 2

## 2023-05-11 MED ORDER — LIDOCAINE 5 % EX PTCH
2.0000 | MEDICATED_PATCH | CUTANEOUS | 0 refills | Status: DC
  Filled 2023-05-11: qty 30, 15d supply, fill #0

## 2023-05-11 MED ORDER — BUMETANIDE 1 MG PO TABS
1.0000 mg | ORAL_TABLET | Freq: Two times a day (BID) | ORAL | 0 refills | Status: DC
  Filled 2023-05-11 – 2023-05-19 (×2): qty 60, 30d supply, fill #0
  Filled ????-??-??: fill #0

## 2023-05-11 MED ORDER — METOPROLOL SUCCINATE ER 25 MG PO TB24
75.0000 mg | ORAL_TABLET | Freq: Every day | ORAL | 0 refills | Status: DC
  Filled 2023-05-11: qty 30, 10d supply, fill #0

## 2023-05-11 MED ORDER — BISACODYL 5 MG PO TBEC: 5.0000 mg | DELAYED_RELEASE_TABLET | Freq: Every day | ORAL | Status: DC | PRN

## 2023-05-11 MED ORDER — CETIRIZINE HCL 10 MG PO TABS
10.0000 mg | ORAL_TABLET | Freq: Every day | ORAL | 0 refills | Status: AC
  Filled 2023-05-11: qty 30, 30d supply, fill #0

## 2023-05-11 MED ORDER — PANTOPRAZOLE SODIUM 20 MG PO TBEC
40.0000 mg | DELAYED_RELEASE_TABLET | Freq: Two times a day (BID) | ORAL | 0 refills | Status: DC
  Filled 2023-05-11: qty 120, 30d supply, fill #0

## 2023-05-11 MED ORDER — SMOG ENEMA
400.0000 mL | RECTAL | Status: AC
  Administered 2023-05-11: 400 mL via RECTAL
  Filled 2023-05-11: qty 960

## 2023-05-11 MED ORDER — DOXYCYCLINE HYCLATE 100 MG PO TABS
100.0000 mg | ORAL_TABLET | Freq: Two times a day (BID) | ORAL | 0 refills | Status: DC
Start: 2023-05-11 — End: 2023-06-16
  Filled 2023-05-11: qty 2, 1d supply, fill #0

## 2023-05-11 MED ORDER — VENTOLIN HFA 108 (90 BASE) MCG/ACT IN AERS
2.0000 | INHALATION_SPRAY | Freq: Four times a day (QID) | RESPIRATORY_TRACT | 0 refills | Status: DC | PRN
  Filled 2023-05-11: qty 18, 25d supply, fill #0

## 2023-05-11 MED ORDER — POLYETHYLENE GLYCOL 3350 17 GM/SCOOP PO POWD
0.5000 | Freq: Once | ORAL | Status: AC
  Administered 2023-05-11: 127.5 g via ORAL
  Filled 2023-05-11: qty 238

## 2023-05-11 MED ORDER — DILTIAZEM GEL 2 %
Freq: Three times a day (TID) | CUTANEOUS | Status: DC
  Filled 2023-05-11: qty 30

## 2023-05-11 MED ORDER — DICLOFENAC SODIUM 1 % EX GEL
2.0000 g | Freq: Two times a day (BID) | CUTANEOUS | 0 refills | Status: AC
  Filled 2023-05-11: qty 50, 30d supply, fill #0

## 2023-05-11 MED ORDER — SORBITOL 70 % SOLN
60.0000 mL | Freq: Once | Status: AC
  Administered 2023-05-11: 60 mL via ORAL
  Filled 2023-05-11: qty 60

## 2023-05-11 MED ORDER — DULERA 200-5 MCG/ACT IN AERO
2.0000 | INHALATION_SPRAY | Freq: Two times a day (BID) | RESPIRATORY_TRACT | 0 refills | Status: DC
  Filled 2023-05-11: qty 13, 30d supply, fill #0

## 2023-05-11 MED ORDER — METHOCARBAMOL 500 MG PO TABS
500.0000 mg | ORAL_TABLET | Freq: Four times a day (QID) | ORAL | 0 refills | Status: DC | PRN
  Filled 2023-05-11: qty 56, 14d supply, fill #0
  Filled 2023-05-19: qty 56, 14d supply, fill #1

## 2023-05-11 MED ORDER — SPIRONOLACTONE 25 MG PO TABS
25.0000 mg | ORAL_TABLET | Freq: Every day | ORAL | 0 refills | Status: DC
  Filled 2023-05-11: qty 30, 30d supply, fill #0

## 2023-05-11 MED ORDER — POLYETHYLENE GLYCOL 3350 17 G PO PACK: 17.0000 g | PACK | Freq: Every day | ORAL | Status: DC

## 2023-05-11 MED ORDER — APIXABAN 5 MG PO TABS
5.0000 mg | ORAL_TABLET | Freq: Two times a day (BID) | ORAL | 0 refills | Status: AC
  Filled 2023-05-11 – 2023-12-01 (×3): qty 60, 30d supply, fill #0
  Filled ????-??-??: fill #0

## 2023-05-11 MED ORDER — MAGNESIUM OXIDE 400 MG PO TABS
400.0000 mg | ORAL_TABLET | Freq: Every day | ORAL | 0 refills | Status: DC
  Filled 2023-05-11: qty 30, 30d supply, fill #0

## 2023-05-11 NOTE — Telephone Encounter (Signed)
 Pharmacy Patient Advocate Encounter   Received notification that prior authorization for Lidocaine 5% patches is required/requested.   Insurance verification completed.   The patient is insured through [employer] [place] MEDICAID .   Per test claim: PA required; PA submitted to above mentioned insurance via CoverMyMeds Key/confirmation #/EOC [facility] Status is pending

## 2023-05-11 NOTE — Progress Notes (Signed)
 Inpatient Rehabilitation Discharge Medication Review by a Pharmacist  A complete drug regimen review was completed for this patient to identify any potential clinically significant medication issues.  High Risk Drug Classes Is patient taking? Indication by Medication  Antipsychotic No   Anticoagulant Yes Apixaban: AFib  Antibiotic Yes Doxycycline: cellulitis  Opioid Yes Norco: pain  Antiplatelet No   Hypoglycemics/insulin No   Vasoactive Medication Yes Bumex, Toprol, spironolactone: CHF, AFib  Chemotherapy No   Other Yes Bisacodyl, Miralax, senokot: constipation Lidoderm: pain Robaxin: muscle spasms Lorazepam: anxiety, nausea Tylenol, Voltaren gel, Lyrica: pain Zyrtec: allergies, rhinitis Melatonin: sleep Protonix: reflux Silvadene cream: skin care Albuterol: SOB MVI, Niferex, MagOx: vitamin/supplement Dupixent, Dulera: asthma Lifitegrast: eye lubricant Diltiazem rectal gel: anal fissure     Type of Medication Issue Identified Description of Issue Recommendation(s)  Drug Interaction(s) (clinically significant)     Duplicate Therapy     Allergy     No Medication Administration End Date     Incorrect Dose     Additional Drug Therapy Needed     Significant med changes from prior encounter (inform family/care partners about these prior to discharge).  Restart or discontinue as appropriate. Communicate medication changes with patient/family at discharge  Other       Clinically significant medication issues were identified that warrant physician communication and completion of prescribed/recommended actions by midnight of the next day:  No  Time spent performing this drug regimen review (minutes): 30  Thank you for allowing pharmacy to be a part of this patient's care.   Signe Colt, PharmD 05/11/2023 8:15 AM  **Pharmacist phone directory can be found on [email or web address] listed under [facility]**

## 2023-05-11 NOTE — Progress Notes (Addendum)
 This RN administered SMOG enema as order by provider. Patient was only able to tolerate approx 1/4 of the solution d/t excruciating rectal pain. Results were a small type 1 after several painful attempts on the [facility] and Toilet. O2 2% PRN ordered for shob d/t anxiety and pain. Vitals WDL Update: patient had additional type 6 BM at 1130

## 2023-05-11 NOTE — Telephone Encounter (Signed)
 Pharmacy Patient Advocate Encounter  Received notification from [facility] MEDICAID that Prior Authorization for Lidocaine 5% patches  has been APPROVED from 05/11/2023 to 05/10/2024   PA #/Case ID/Reference #: [id number]

## 2023-05-11 NOTE — Progress Notes (Signed)
 PROGRESS NOTE   Subjective/Complaints: Pt reports LBM 9 days ago- reports had "1 tiny stool"- but feels awful and "won't go home until resolved".   Ordered SMOG enema after spoke with GI- they agreed with plan, however pt refused SMOG this AM- pt refuses any more disimpaction, however explained she has a stool ball and the only way to get it out is to break it up so she can get it out.   Pt insistent she be "put out for this".   Also notes got Bumex x1 this weekend for weight gain.    ROS:    Pt denies SOB, abd pain, CP, N/V/ (+) severe constipation C/D, and vision changes    Objective:   DG Abd 1 View Result Date: 05/10/2023 CLINICAL DATA:  Constipation. No bowel movement for several days. Midline abdominal pain. EXAM: ABDOMEN - 1 VIEW COMPARISON:  Lumbar spine radiographs 04/08/23. FINDINGS: Moderate stool is present at the rectum. Bowel gas pattern throughout the colon is otherwise within normal limits. No obstruction is present. Small bowel is unremarkable. No definite free air is present. IMPRESSION: 1. Moderate stool at the rectum. 2. No obstruction or other acute abnormality. Electronically Signed   By: Marin Roberts M.D.   On: 05/10/2023 12:12    No results for input(s): "WBC", "HGB", "HCT", "PLT" in the last 72 hours.   No results for input(s): "NA", "K", "CL", "CO2", "GLUCOSE", "BUN", "CREATININE", "CALCIUM" in the last 72 hours.    Intake/Output Summary (Last 24 hours) at 05/11/2023 0856 Last data filed at 05/11/2023 0700 Gross per 24 hour  Intake 236 ml  Output 966 ml  Net -730 ml        Physical Exam: Vital Signs Blood pressure 112/62, pulse 74, temperature 98.6 F (37 C), temperature source Oral, resp. rate 18, height 5\' 5"  (1.651 m), weight (!) 172.8 kg, last menstrual period 12/20/2017, SpO2 96%.    General: awake, alert, appropriate, supine in bed; NAD HENT: conjugate gaze; oropharynx  moist CV: regular rate and rhythm; no JVD Pulmonary: CTA B/L; no W/R/R- good air movement GI: soft, NT, ND, protuberant, (+)BS- slightly hypoactive Psychiatric: appropriate but very upset about stool issues Neurological: Ox3  Skin- popped small abscess on bottom of R breast- with a lot of bruising surrounding like has been popped as well as splatters of blood on gown and pillow MSK- L shoulder trigger points palpated- are better today Extremities: no LE edema palpated at all- - looks good; not seen in hands- again in spite of K tape being removed  Reports altered sensation medial left hand Musculoskeletal:     Cervical back: Neck supple.     Moving all 4 extremities to gravity and resistance  TTP L periscapular muscles Tinels at elbow- equivocal Mild L trap pain with L shoulder abduction and ROM Improved somewhat- wearing lidoderm patch on posterior L shoulder, stable 3/1  Skin:    General: Skin is warm.     Comments: no LE edema B/L Some eczema- not bad currently Heels dried/cracked,     Assessment/Plan: 1. Functional deficits which require 3+ hours per day of interdisciplinary therapy in a comprehensive inpatient rehab setting. Physiatrist is  providing close team supervision and 24 hour management of active medical problems listed below. Physiatrist and rehab team continue to assess barriers to discharge/monitor patient progress toward functional and medical goals  Care Tool:  Bathing    Body parts bathed by patient: Right arm, Left arm, Chest, Abdomen, Front perineal area, Left upper leg, Right upper leg, Face, Buttocks, Left lower leg, Right lower leg   Body parts bathed by helper: Right lower leg, Left lower leg, Buttocks     Bathing assist Assist Level: Independent with assistive device     Upper Body Dressing/Undressing Upper body dressing   What is the patient wearing?: Pull over shirt    Upper body assist Assist Level: Independent with assistive device     Lower Body Dressing/Undressing Lower body dressing      What is the patient wearing?: Underwear/pull up, Pants     Lower body assist Assist for lower body dressing: Independent with assitive device     Toileting Toileting    Toileting assist Assist for toileting: Independent with assistive device     Transfers Chair/bed transfer  Transfers assist     Chair/bed transfer assist level: Independent with assistive device     Locomotion Ambulation   Ambulation assist      Assist level: Contact Guard/Touching assist Assistive device: Walker-rolling Max distance: 244 ft   Walk 10 feet activity   Assist     Assist level: Contact Guard/Touching assist Assistive device: Walker-rolling   Walk 50 feet activity   Assist    Assist level: Contact Guard/Touching assist Assistive device: Walker-rolling    Walk 150 feet activity   Assist    Assist level: Contact Guard/Touching assist Assistive device: Walker-rolling    Walk 10 feet on uneven surface  activity   Assist Walk 10 feet on uneven surfaces activity did not occur: Safety/medical concerns         Wheelchair     Assist Is the patient using a wheelchair?: Yes (transport only) Type of Wheelchair: Manual    Wheelchair assist level: Dependent - Patient 0% Max wheelchair distance: 150    Wheelchair 50 feet with 2 turns activity    Assist        Assist Level: Dependent - Patient 0%   Wheelchair 150 feet activity     Assist      Assist Level: Dependent - Patient 0%   Blood pressure 112/62, pulse 74, temperature 98.6 F (37 C), temperature source Oral, resp. rate 18, height 5\' 5"  (1.651 m), weight (!) 172.8 kg, last menstrual period 12/20/2017, SpO2 96%.  Medical Problem List and Plan: 1. Functional deficits secondary to debility related to acute on chronic diastolic congestive heart failure with exacerbation- diuresed significant weight             -patient may   shower             -ELOS/Goals: 5-7 days mod I- doesn't have help at home  Don'e with therapy  Was supposed to d/c home today, but hasn't had BM in 9 days per pt (had tiny BM 3/1)- doesn't want to leave until this is resolved Will need nebs to go home with   2.  Antithrombotics: -DVT/anticoagulation:  Pharmaceutical: Eliquis             -antiplatelet therapy: N/A 3. Pain Management: Voltaren gel twice daily, Lyrica 200 mg twice daily, hydrocodone as needed  2/21- pain usually controlled- con't regimen  -2/23 check C spine xray due to  shooting pain reported down L arm.  2/24- xray shows some degenerative changes, but nothing to explain L side specifically.   -her Robaxin extremely helpful  2/26- will add Kpad per pt/nursing- will do trP injections tomorrow  2/27- kpad helpful- will do trp injections today  2/28- so much better today  3/3- having more numbness in LUE- but pain is doing OK- con't regimen- might need EMG outpt if doesn't improve in next 1-2 months- now "entire" hand is numb 4. Mood/Behavior/Sleep: Melatonin 10 mg nightly             -antipsychotic agents: N/A 5. Neuropsych/cognition: This patient is capable of making decisions on her own behalf. 6. Skin/Wound Care: Routine skin checks 7. Fluids/Electrolytes/Nutrition: Routine in and outs with follow-up chemistries 8.  PAF/A-fib with RVR.  Cardioversion 04/28/2023.  Cardiac rate controlled.  Toprol-XL 75 mg daily  2/25- sounds regular- con't regimen 9.  E. coli UTI.  Completed course of Rocephin 10.  Right thigh cellulitis.  Completed course of Rocephin 11.  Gastroenteritis secondary to norovirus/diarrhea.  Improving.  Supportive care..  Enteric precautions- no Sx's for 4 days, can stop if OK with Infection prevention  2/21- can stop since been 5 days since last Sx. Actually is constipated now  -2/23 LBM today. Pt with nausea today.  Check abd xray. Will order 4mg  zofran- discussed with pharmacy(appreciate assistance) as pt  has intermittent elevated QT. QTC not elevated on last EKG. Check labs and replete Mg for goal 2.0 ,K+ for goal above 4. If she needs more than one dose consider EKG tomorrow.   Check orthostatic vital signs.   2/24- no nausea- had regular Bms x2 yesterday 12.  Asthma.  Continue inhalers as directed. check oxygen saturations every shift  2/27- needed nebs yesterday- will send home with nebs albuterol.  13.  OSA.  CPAP 14.  Acute on chronic CHF exacerbation.  Monitor for any signs of fluid overload.  Continue Bumex 1 mg twice daily, Aldactone 25 mg daily  2/21- weight up 4 kg- however bed was changed- will monitor trend- no LE edema  2/22 Will adjust Bumex timing to 3pm  3/2 weight significantly better  Filed Weights   05/09/23 0453 05/10/23 0500 05/11/23 0500  Weight: (!) 174.3 kg (!) 173.2 kg (!) 172.8 kg    2/25- Weight about the same, but pt feels more swollen- in hands and feet- will try 1 mg Bumex in addition to 1 mg BID  2/26- Will wait on bumex- weight down 4 kg- she didn't pee as much yesterday during day, but did last night- no signs of edema on exam- see how things go with weight in next 1 day.   2/27- so far looks like weight  might be up- since in bed- comparing to weight in bed yesterday, up 3 kg- will give afternoon Bumex if continues tomorrow-   3/1 additional 1mg  Bumex ordered  3/2: discussed that weight is much improved  3/3- weight still better 15.  Acute on chronic anemia.  D/c Niferex for now given severe constipation.  Check CBC  16.  Diabetes mellitus.  Latest hemoglobin A1c 6.7.  Presently on SSI.  Patient on Ozempic prior to admission.  2/21- BG's 109-229- only 1 value over 200- will see if family cane bring in Ozempic  2/23 fair control CBGs, continue to monitor   2/27-2/28 Fair control- family to bring in Ozempic. -she said will wait til gets home CBG (last 3)  Recent Labs  05/10/23 1638 05/10/23 2101 05/11/23 0559  GLUCAP 190* 134* 123*    17.  Obesity.   BMI 62.77.  Dietary follow-up  2/21- weight 178.8 kg- up a lot form yesterday but has different bed- will monitor trend  2/24- weight back down- to 172.6 kg- which is weight she came over at.   2/25- Weight up 5 lbs since low of what she came over at- will do 1 mg Bumex x1  3/2: discussed weight decreased with Bumex 18. Eczema- usually takes Dupixant- will see if can restart. Pt can bring in from home 19. Constipation- LBM 4 days ago- wants to wait on Sorbitol  2/21- will give Sorbitol Saturday if no BM- so far it's been 5 days- no Bariatric BSC in room.   2/22 reports large BM yesterday, continue to monitor bowel function  2/23 LBM today  2/24- 4 Bms over weekend- going well again.   2/25- LBM 2 days ago- will monitor  2/26- LBM 3 days ago- wil add Miralax- if doesn't go, tomorrow, will add Sorbitol   2/27- Will give 45 cc Sorbitol today after therapy  GI consulted given severity of constipation  3/3- GI called- they agreed to try SMOG before we had them see her- has stool ball in rectum needs to get out- Called GI again 20. Preventative- will do vaseline for heels and change to regular bed per pt- see if can get Zyrtec from home to take since don't have here.  21. Yeast infection -2/22 fluconazole 150mg  1x  -2/23 symptoms improved today  22. 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   I spent a total of  53  minutes on total care today- >50% coordination of care- due to  D/w nursing 4 different times- as well as PA and GI- about stool ball     LOS: 11 days A FACE TO FACE EVALUATION WAS PERFORMED  Alyssa Blanchard 05/11/2023, 8:56 AM

## 2023-05-11 NOTE — Consult Note (Addendum)
 Consultation Note   Referring Provider:  Inpatient Rehab - Dr. Luz Lex PCP: Alyssa Blanchard, Alyssa Russell, MD Primary Gastroenterologist:     Alyssa Blanchard Reason for Consultation:  Constipation / rectal pain DOA: 04/30/2023         Hospital Day: 12   Attending physician's note  I have taken a history, reviewed the chart and examined the patient. I performed a substantive portion of this encounter, including complete performance of at least one of the key components, in conjunction with the APP. I agree with the APP's note, impression and recommendations.    Severe constipation associated with rectal discomfort and bright red blood per rectum due to anal fissure based on rectal exam performed by Alyssa Blanchard.  I could not repeat rectal exam as patient was seen in the hallway Diltiazem gel 2% small pea-sized amount per rectum 3 times daily Plan for bowel purge with MiraLAX Use daily MiraLAX and add Colace at bedtime to prevent constipation   The patient was provided an opportunity to ask questions and all were answered. The patient agreed with the plan and demonstrated an understanding of the instructions.  Alyssa Blanchard , MD [phone]     ASSESSMENT    Brief Narrative:  53 y.o. year old female in Inpatient Rehab after hospitalization for acute on chronic heart failure. Has been having problems with severe constipation over last several days refractory to several medications.   Acute constipation with rectal pain and bleeding, likely secondary to pain medications. Suspect posterior midline anal fissure though unable to confirm due to the pain with attempted DRE. Yesterday's KUB showed moderate stool in rectum but having some stool output now after enema and ongoing oral agents.   History of colon polyps ( SSAs) in Aug 2024.  Surveillance colonoscopy due August 2027  History of sleeve gastrectomy  Paroxysmal atrial fibrillation, on apixaban  Acute  on chronic heart failure OSA on CPAP DM2 Obesity   PLAN:   --1/2 bowel prep today --Colace 2 Q HS --Miralax BID  --Continue topical lidocaine as needed to anorectal area --Will try to get Diltiazem ointment for presumed fissure. I showed her how to apply. Will need to use TID for 6 weeks.    HPI   We saw done in the office in August 2024 for evaluation of iron deficiency anemia..  She underwent upper and lower endoscopy with findings as below   Alyssa Blanchard was hospitalized for several days in February 2025 with acute on chronic heart failure , UTI,  gastroenteritis secondary to norovirus, cellulitis of thigh.  She was transferred to inpatient rehab on 04/30/2023.  Patient was supposed to be discharged today but that has been going on hold secondary to severe constipation.  Apparently patient has not had any significant stool output in 9 days  KUB yesterday shows moderate stool at the rectum.  No obstruction or acute abnormalities  Received SMOG enema this am. Has since passed some formed stools. Having abdominal cramping since enemal. She doesn't have problems with constipation at home. Several days ago was straining to have a BM and passed a small amount of bright red blood. Has been having intense rectal pain over last several days    Previous GI Evaluations   EGD +  colonoscopy Aug 2024 for IDA The examined esophagus was normal. Evidence of a sleeve gastrectomy was found in the gastric body. This was characterized by healthy appearing mucosa. Diffuse granular mucosa and patchy erythema was found in the gastric body and in the gastric antrum. Biopsies were taken with a cold forceps for Helicobacter pylori testing.  The examined duodenum was normal. Biopsies for histology were taken with a cold forceps for evaluation of celiac disease.   Colonoscopy Aug 2024  - One 18 mm polyp in the proximal ascending colon, removed piecemeal using a cold snare. Resected and retrieved. - Three 2 to 6 mm  polyps in the rectum, removed with a cold snare. Resected and retrieved. - The examined portion of the ileum was normal. - The distal rectum and anal verge are normal on retroflexion view.   A. DUODENUM, BIOPSY:  Focal acute duodenitis   B. STOMACH, BIOPSY:  Reactive gastropathy and minimal chronic gastritis with lymphoid  aggregate  Negative for H. pylori, intestinal metaplasia, dysplasia and carcinoma  (see comment)   C. 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 RECTUM, POLYPECTOMY:  Hyperplastic polyp with changes of mucosal prolapse  Negative for dysplasia and carcinoma   COMMENT:  The gastric biopsies (B) also show a strip of unremarkable duodenal  mucosa.      Past Medical History:  Diagnosis Date   Acute on chronic congestive heart failure (HCC) 05/02/2023   Anxiety    Asthma    Atrial fibrillation (HCC)    Depression    DM (diabetes mellitus), type 2 (HCC) 02/16/2022   Dysrhythmia    new onset Afib rvr   GERD (gastroesophageal reflux disease)    Heart failure (HCC)    Heel spur    bilat   History of bronchitis    History of chicken pox    History of urinary tract infection    Hypertension    Migraines    Plantar fasciitis    bilat   STD (sexually transmitted disease)    chl hx & hsv 1&2   Tremors of nervous system    Urinary incontinence     Past Surgical History:  Procedure Laterality Date   BIOPSY  10/14/2022   Procedure: BIOPSY;  Surgeon: Alyssa Lucks, MD;  Location: [facility];  Service: Gastroenterology;;   CARDIOVERSION N/A 08/05/2019   Procedure: CARDIOVERSION;  Surgeon: Alyssa Mixer, MD;  Location: [facility] ENDOSCOPY;  Service: Cardiovascular;  Laterality: N/A;   CARDIOVERSION N/A 04/28/2023   Procedure: CARDIOVERSION;  Surgeon: Alyssa Bathe, MD;  Location: [facility] INVASIVE CV LAB;  Service: Cardiovascular;  Laterality: N/A;   COLONOSCOPY WITH PROPOFOL N/A 10/14/2022   Procedure: COLONOSCOPY WITH  PROPOFOL;  Surgeon: Alyssa Lucks, MD;  Location: [facility] ENDOSCOPY;  Service: Gastroenterology;  Laterality: N/A;   ESOPHAGOGASTRODUODENOSCOPY (EGD) WITH PROPOFOL N/A 10/14/2022   Procedure: ESOPHAGOGASTRODUODENOSCOPY (EGD) WITH PROPOFOL;  Surgeon: Alyssa Lucks, MD;  Location: [facility] ENDOSCOPY;  Service: Gastroenterology;  Laterality: N/A;   LAPAROSCOPIC GASTRIC SLEEVE RESECTION N/A 11/27/2014   Procedure: LAPAROSCOPIC GASTRIC SLEEVE RESECTION WITH HIATAL HERNIA REPAIR UPPER ENDOSCOPY;  Surgeon: Luretha Murphy, MD;  Location: [facility] ORS;  Service: General;  Laterality: N/A;   POLYPECTOMY  10/14/2022   Procedure: POLYPECTOMY;  Surgeon: Alyssa Lucks, MD;  Location: [facility] ENDOSCOPY;  Service: Gastroenterology;;   TEE WITHOUT CARDIOVERSION N/A 08/05/2019   Procedure: TRANSESOPHAGEAL ECHOCARDIOGRAM (TEE);  Surgeon: [name] Deloris Ping, MD;  Location: [facility] ENDOSCOPY;  Service: Cardiovascular;  Laterality: N/A;   TONSILLECTOMY  1978    Family History  Problem Relation Age of Onset   Diabetes Mother    Hypertension Mother    Hyperlipidemia Mother    Kidney disease Mother        CKD stage 4   Pancreatic cancer Father        pancreatic   Hypertension Maternal Grandmother    Diabetes Maternal Grandmother    Heart failure Maternal Grandmother        CHF   Heart attack Maternal Grandfather    Hypertension Maternal Grandfather    Hypertension Paternal Grandmother    Alzheimer's disease Paternal Grandmother    Hypertension Paternal Grandfather    Cancer Maternal Uncle        melanoma   Cancer Paternal Uncle        kidney   Colon cancer Neg Hx    Esophageal cancer Neg Hx    Stomach cancer Neg Hx     Prior to Admission medications   Medication Sig Start Date End Date Taking? Authorizing Provider  acetaminophen (TYLENOL) 325 MG tablet Take 2 tablets (650 mg total) by mouth every 6 (six) hours as needed for mild pain (pain score 1-3) (or Fever >/= 101). 05/10/23   Angiulli, Mcarthur Rossetti, PA-C   acetaminophen (TYLENOL) 500 MG tablet Take 1,000 mg by mouth See admin instructions. Take 1,000 mg by mouth in the morning and at bedtime and an additional 1,000 mg once a day as needed for pain    [provider]  apixaban (ELIQUIS) 5 MG TABS tablet Take 1 tablet (5 mg total) by mouth 2 (two) times daily. 05/11/23 06/10/23  Angiulli, Mcarthur Rossetti, PA-C  bisacodyl (DULCOLAX) 5 MG EC tablet Take 1 tablet (5 mg total) by mouth daily as needed for moderate constipation. 05/11/23   Angiulli, Mcarthur Rossetti, PA-C  bumetanide (BUMEX) 1 MG tablet Take 1 tablet (1 mg total) by mouth 2 (two) times daily. 05/11/23 06/10/23  Angiulli, Mcarthur Rossetti, PA-C  cetirizine (ZYRTEC) 10 MG tablet Take 1 tablet (10 mg total) by mouth daily. 05/11/23   Angiulli, Mcarthur Rossetti, PA-C  diclofenac Sodium (VOLTAREN ARTHRITIS PAIN) 1 % GEL Apply 2 g topically 2 (two) times daily. 05/11/23   Angiulli, Mcarthur Rossetti, PA-C  doxycycline (VIBRA-TABS) 100 MG tablet Take 1 tablet (100 mg total) by mouth every 12 (twelve) hours. 05/11/23   Angiulli, Mcarthur Rossetti, PA-C  DULERA 200-5 MCG/ACT AERO Inhale 2 puffs into the lungs in the morning and at bedtime. 05/11/23   Angiulli, Mcarthur Rossetti, PA-C  Dupilumab (DUPIXENT) 300 MG/2ML SOPN Inject 300 mg into the skin every 14 (fourteen) days.    [provider]  HYDROcodone-acetaminophen (NORCO) 10-325 MG tablet Take 1-2 tablets by mouth every 6 (six) hours as needed for moderate pain (pain score 4-6). 05/11/23   Angiulli, Mcarthur Rossetti, PA-C  iron polysaccharides (NIFEREX) 150 MG capsule Take 1 capsule by mouth daily. 03/26/23   [provider]  lidocaine (LIDODERM) 5 % Place 2 patches onto the skin daily. Remove & Discard patch within 12 hours or as directed by MD 05/11/23   Angiulli, Mcarthur Rossetti, PA-C  Lifitegrast Benay Spice) 5 % SOLN Place 1 drop into both eyes in the morning and at bedtime.    [provider]  LORazepam (ATIVAN) 0.5 MG tablet Take 1 tablet (0.5 mg total) by mouth every 6 (six) hours as needed for anxiety  (Nausea). 05/11/23   Angiulli, Mcarthur Rossetti, PA-C  magnesium oxide (MAG-OX)  400 MG tablet Take 1 tablet (400 mg total) by mouth daily. 05/11/23   Angiulli, Mcarthur Rossetti, PA-C  melatonin 5 MG TABS Take 2 tablets (10 mg total) by mouth at bedtime. 05/11/23   Angiulli, Mcarthur Rossetti, PA-C  methocarbamol (ROBAXIN) 500 MG tablet Take 1 tablet (500 mg total) by mouth every 6 (six) hours as needed for muscle spasms. 05/11/23   Angiulli, Mcarthur Rossetti, PA-C  metoprolol succinate (TOPROL-XL) 25 MG 24 hr tablet Take 3 tablets (75 mg total) by mouth daily. 05/11/23 06/10/23  Angiulli, Mcarthur Rossetti, PA-C  Multiple Vitamins-Minerals (BARIATRIC MULTIVITAMINS/IRON PO) Take 1 tablet by mouth daily with breakfast.    [provider]  nystatin (MYCOSTATIN/NYSTOP) powder Apply topically 2 (two) times daily. Patient taking differently: Apply 1 Application topically daily. 02/01/20   Armandina Stammer I, NP  pantoprazole (PROTONIX) 20 MG tablet Take 2 tablets (40 mg total) by mouth 2 (two) times daily. 05/11/23   Angiulli, Mcarthur Rossetti, PA-C  polyethylene glycol (MIRALAX / GLYCOLAX) 17 g packet Take 17 g by mouth daily. 05/11/23   Angiulli, Mcarthur Rossetti, PA-C  potassium chloride SA (KLOR-CON M) 20 MEQ tablet Take 1 tablet (20 mEq total) by mouth daily. 04/30/23 05/30/23  Arrien, York Ram, MD  pregabalin (LYRICA) 200 MG capsule Take 200 mg by mouth 2 (two) times daily.    [provider]  pregabalin (LYRICA) 200 MG capsule Take 1 capsule (200 mg total) by mouth 2 (two) times daily. 05/11/23   Angiulli, Mcarthur Rossetti, PA-C  senna-docusate (SENOKOT-S) 8.6-50 MG tablet Take 1 tablet by mouth at bedtime as needed for mild constipation. 05/11/23   Angiulli, Mcarthur Rossetti, PA-C  silver sulfADIAZINE (SILVADENE) 1 % cream Apply 1 Application topically daily.    [provider]  silver sulfADIAZINE (SILVADENE) 1 % cream Apply topically daily. Apply to right side 05/11/23   Angiulli, Mcarthur Rossetti, PA-C  spironolactone (ALDACTONE) 25 MG tablet Take 1 tablet (25 mg total)  by mouth daily. Patient taking differently: Take 25 mg by mouth in the morning. 03/11/22   Angiulli, Mcarthur Rossetti, PA-C  spironolactone (ALDACTONE) 25 MG tablet Take 1 tablet (25 mg total) by mouth daily. 05/11/23   Angiulli, Mcarthur Rossetti, PA-C  VENTOLIN HFA 108 (90 Base) MCG/ACT inhaler Inhale 2 puffs into the lungs every 6 (six) hours as needed for shortness of breath. 05/11/23   Angiulli, Mcarthur Rossetti, PA-C  VOLTAREN ARTHRITIS PAIN 1 % GEL Apply 2 g topically See admin instructions. Apply 2 grams to affected areas in the morning and at bedtime 01/20/22   [provider]  ZYRTEC ALLERGY 10 MG tablet Take 10 mg by mouth daily as needed (for seasonal allergies).    [provider]    Current Facility-Administered Medications  Medication Dose Route Frequency Provider Last Rate Last Admin   acetaminophen (TYLENOL) tablet 650 mg  650 mg Oral Q6H PRN Charlton Amor, PA-C   650 mg at 05/06/23 2342   Or   acetaminophen (TYLENOL) suppository 650 mg  650 mg Rectal Q6H PRN Angiulli, Mcarthur Rossetti, PA-C       albuterol (PROVENTIL) (2.5 MG/3ML) 0.083% nebulizer solution 3 mL  3 mL Nebulization Q4H PRN Charlton Amor, PA-C   3 mL at 05/08/23 1413   apixaban (ELIQUIS) tablet 5 mg  5 mg Oral BID AngiulliMcarthur Rossetti, PA-C   5 mg at 05/11/23 0820   bisacodyl (DULCOLAX) EC tablet 5 mg  5 mg Oral Daily PRN Charlton Amor, PA-C  5 mg at 05/10/23 2025   bumetanide (BUMEX) tablet 1 mg  1 mg Oral BID Fanny Dance, MD   1 mg at 05/11/23 0820   cetirizine (ZYRTEC) tablet 10 mg  10 mg Oral Daily Lovorn, Megan, MD   10 mg at 05/11/23 1152   diclofenac Sodium (VOLTAREN) 1 % topical gel 2 g  2 g Topical BID AngiulliMcarthur Rossetti, PA-C   2 g at 05/11/23 1159   doxycycline (VIBRA-TABS) tablet 100 mg  100 mg Oral Q12H Lovorn, Aundra Millet, MD   100 mg at 05/11/23 4098   Glycerin (Adult) 2 g suppository 1 suppository  1 suppository Rectal Q2H PRN Street, [place], PA-C       HYDROcodone-acetaminophen [facility]) 10-325 MG per  tablet 1-2 tablet  1-2 tablet Oral Q6H PRN Charlton Amor, PA-C   2 tablet at 05/11/23 0555   insulin aspart (novoLOG) injection 0-9 Units  0-9 Units Subcutaneous TID WC Angiulli, Mcarthur Rossetti, PA-C   2 Units at 05/11/23 1152   lidocaine (LIDODERM) 5 % 2 patch  2 patch Transdermal Q24H Lovorn, Megan, MD   2 patch at 05/09/23 2353   lidocaine (XYLOCAINE) 2 % jelly 1 Application  1 Application Other PRN Street, [place], [place]   1 Application at 05/11/23 1191   LORazepam (ATIVAN) tablet 0.5 mg  0.5 mg Oral Q6H PRN Horton Chin, MD   0.5 mg at 05/11/23 0827   magnesium oxide (MAG-OX) tablet 400 mg  400 mg Oral Daily Charlton Amor, PA-C   400 mg at 05/11/23 4782   melatonin tablet 10 mg  10 mg Oral QHS AngiulliMcarthur Rossetti, PA-C   10 mg at 05/10/23 2200   methocarbamol (ROBAXIN) tablet 500 mg  500 mg Oral Q6H PRN Fanny Dance, MD   500 mg at 05/11/23 1155   metoprolol succinate (TOPROL-XL) 24 hr tablet 75 mg  75 mg Oral Daily Charlton Amor, PA-C   75 mg at 05/11/23 0819   milk and molasses enema  1 enema Rectal Daily PRN Horton Chin, MD   240 mL at 05/09/23 2223   mineral oil-hydrophilic petrolatum (AQUAPHOR) ointment   Topical BID Lovorn, Aundra Millet, MD   Given at 05/10/23 2029   mometasone-formoterol (DULERA) 200-5 MCG/ACT inhaler 1 puff  1 puff Inhalation Daily Charlton Amor, PA-C   1 puff at 05/11/23 0822   ondansetron (ZOFRAN) tablet 4 mg  4 mg Oral Q8H PRN Fanny Dance, MD   4 mg at 05/10/23 1255   pantoprazole (PROTONIX) EC tablet 40 mg  40 mg Oral BID Charlton Amor, PA-C   40 mg at 05/11/23 0820   polyethylene glycol (MIRALAX / GLYCOLAX) packet 17 g  17 g Oral Daily Lovorn, Megan, MD   17 g at 05/11/23 9562   pregabalin (LYRICA) capsule 200 mg  200 mg Oral BID Charlton Amor, PA-C   200 mg at 05/11/23 1308   senna-docusate (Senokot-S) tablet 1 tablet  1 tablet Oral QHS PRN Charlton Amor, PA-C   1 tablet at 05/08/23 2204   silver sulfADIAZINE  (SILVADENE) 1 % cream   Topical Daily Charlton Amor, PA-C   Given at 05/11/23 1201   sorbitol 70 % solution 60 mL  60 mL Oral Once Lovorn, Aundra Millet, MD       spironolactone (ALDACTONE) tablet 25 mg  25 mg Oral Daily AngiulliMcarthur Rossetti, PA-C   25 mg at 05/11/23 0820   Facility-Administered Medications Ordered in Other Encounters  Medication Dose Route Frequency Provider Last Rate Last Admin   acetaminophen (TYLENOL) tablet 650 mg  650 mg Oral Once Thayil, Irene T, PA-C       ferric derisomaltose (MONOFERRIC) 1,000 mg in sodium chloride 0.9 % 100 mL infusion  1,000 mg Intravenous Once Thayil, Irene T, PA-C        Allergies as of 04/30/2023 - Review Complete 04/30/2023  Allergen Reaction Noted   Gabapentin Anaphylaxis, Nausea And Vomiting, and Other (See Comments) 09/01/2017   Topiramate Other (See Comments) 12/15/2018   Albuterol Other (See Comments) 03/25/2022   Hydroxyzine Other (See Comments) 02/28/2022   Imitrex [sumatriptan] Nausea And Vomiting and Other (See Comments) 08/02/2019   Latex Itching 01/26/2020   Montelukast Other (See Comments) 12/14/2018   Other Itching and Other (See Comments) 04/13/2019   Prozac [fluoxetine] Other (See Comments) 01/26/2020   Trazodone and nefazodone Other (See Comments) 01/26/2020   Advair diskus [fluticasone-salmeterol] Palpitations 01/28/2022   Copper-containing compounds Rash and Other (See Comments) 01/26/2020    Social History   Socioeconomic History   Marital status: Single    Spouse name: Not on file   Number of children: 0   Years of education: 12   Highest education level: Some college, no degree  Occupational History   Occupation: Chartered certified accountant: [employer]   Occupation: Unemployed  Tobacco Use   Smoking status: Former    Current packs/day: 0.00    Average packs/day: 0.3 packs/day for 5.0 years (1.3 ttl pk-yrs)    Types: Cigarettes    Start date: 03/11/2007    Quit date: 03/10/2012    Years since quitting: 11.1    Smokeless tobacco: Never  Vaping Use   Vaping status: Never Used  Substance and Sexual Activity   Alcohol use: Not Currently    Comment: Reports history of binge drinking; last in 2021   Drug use: Not Currently    Types: Marijuana, Cocaine    Comment: last time used 10 years   Sexual activity: Yes    Partners: Male    Birth control/protection: Condom  Other Topics Concern   Not on file  Social History Narrative   Regular exercise-no   Caffeine Use-yes, 1-2 cups of caffeine daily   Social Drivers of Health   Financial Resource Strain: Low Risk  (04/30/2023)   Overall Financial Resource Strain (CARDIA)    Difficulty of Paying Living Expenses: Not very hard  Food Insecurity: No Food Insecurity (04/24/2023)   Hunger Vital Sign    Worried About Running Out of Food in the Last Year: Never true    Ran Out of Food in the Last Year: Never true  Transportation Needs: No Transportation Needs (04/30/2023)   PRAPARE - Administrator, Civil Service (Medical): No    Lack of Transportation (Non-Medical): No  Recent Concern: Transportation Needs - Unmet Transportation Needs (04/24/2023)   PRAPARE - Administrator, Civil Service (Medical): Yes    Lack of Transportation (Non-Medical): No  Physical Activity: Inactive (01/29/2023)   Received from [facility]   Exercise Vital Sign    Days of Exercise per Week: 0 days    Minutes of Exercise per Session: 0 min  Stress: Stress Concern Present (11/24/2022)   [employer] of Occupational Health - Occupational Stress Questionnaire    Feeling of Stress : Rather much  Social Connections: Socially Isolated (11/24/2022)   Social Connection and Isolation Panel [NHANES]    Frequency of Communication with  Friends and Family: Three times a week    Frequency of Social Gatherings with Friends and Family: Three times a week    Attends Religious Services: Never    Active Member of Clubs or Organizations: No    Attends Tax inspector Meetings: Not on file    Marital Status: Never married  Intimate Partner Violence: Not At Risk (04/24/2023)   Humiliation, Afraid, Rape, and Kick questionnaire    Fear of Current or Ex-Partner: No    Emotionally Abused: No    Physically Abused: No    Sexually Abused: No     Code Status   Code Status: Full Code  Review of Systems: All systems reviewed and negative except where noted in HPI.  Physical Exam: Vital signs in last 24 hours: Temp:  [98.2 F (36.8 C)-98.7 F (37.1 C)] (P) 98.7 F (37.1 C) (03/03 1045) Pulse Rate:  [74-88] (P) 88 (03/03 1045) Resp:  [18] (P) 18 (03/03 1045) BP: (100-133)/(51-63) (P) 140/88 (03/03 1045) SpO2:  [90 %-98 %] (P) 95 % (03/03 1045) Weight:  [172.8 kg] 172.8 kg (03/03 0500) Last BM Date : 05/11/23  General:  Pleasant female in NAD Psych:  Cooperative. Normal mood and affect Eyes: Pupils equal Ears:  Normal auditory acuity Nose: No deformity, discharge or lesions Neck:  Supple, no masses felt Lungs:  Clear to auscultation.  Heart:  Regular rate, regular rhythm.  Abdomen:  Soft, nondistended, nontender, active bowel sounds, no masses felt Rectal :  DRE precluded by extreme pain at posterior midline. Unable to see a fissure but limited exam due to her discomfort.  Msk: Symmetrical without gross deformities.  Neurologic:  Alert, oriented, grossly normal neurologically Extremities : No edema Skin:  Intact without significant lesions.    Intake/Output from previous day: 03/02 0701 - 03/03 0700 In: 354 [P.O.:354] Out: 966 [Urine:966] Intake/Output this shift:  No intake/output data recorded.   Alyssa Cluster, NP-C   05/11/2023, 1:13 PM

## 2023-05-11 NOTE — Progress Notes (Addendum)
 Patient ID: Alyssa Blanchard, female   DOB: September 30, 1970, 53 y.o.   MRN: [id number]  According to MD pt is not discharging today due to medical issues. Team and staff made aware of plan  12;23 PM Have emailed referral for [facility] for SSD/SSI assistance with applying for. Also ordered a bariatric rollator to see if can get covered and sent PCS referral to Case manager of [facility]-Medicaid

## 2023-05-12 ENCOUNTER — Other Ambulatory Visit (HOSPITAL_COMMUNITY): Payer: Self-pay

## 2023-05-12 LAB — GLUCOSE, CAPILLARY: Glucose-Capillary: 118 mg/dL — ABNORMAL HIGH (ref 70–99)

## 2023-05-12 MED ORDER — DILTIAZEM GEL 2 %
1.0000 | Freq: Three times a day (TID) | CUTANEOUS | 0 refills | Status: DC
  Filled 2023-05-12: qty 45, 15d supply, fill #0

## 2023-05-12 MED ORDER — DOCUSATE SODIUM 100 MG PO CAPS: 200.0000 mg | ORAL_CAPSULE | Freq: Every day | ORAL | Status: DC

## 2023-05-12 MED ORDER — DILTIAZEM GEL 2 %: 1.0000 | Freq: Three times a day (TID) | CUTANEOUS | 0 refills | Status: DC

## 2023-05-12 MED ORDER — ALBUTEROL SULFATE (2.5 MG/3ML) 0.083% IN NEBU
3.0000 mL | INHALATION_SOLUTION | RESPIRATORY_TRACT | 12 refills | Status: DC | PRN
  Filled 2023-05-12: qty 90, 5d supply, fill #0
  Filled 2023-05-19: qty 90, 5d supply, fill #1

## 2023-05-12 NOTE — Plan of Care (Signed)
  Problem: RH BOWEL ELIMINATION Goal: RH STG MANAGE BOWEL WITH ASSISTANCE Description: STG Manage Bowel with supervision Assistance. Outcome: Progressing

## 2023-05-12 NOTE — Plan of Care (Signed)
  Problem: RH Balance Goal: LTG Patient will maintain dynamic standing with ADLs (OT) Description: LTG:  Patient will maintain dynamic standing balance with assist during activities of daily living (OT)  Outcome: Completed/Met Flowsheets (Taken 05/01/2023 1249 by Warnell Forester, OT) LTG: Pt will maintain dynamic standing balance during ADLs with: Independent with assistive device   Problem: Sit to Stand Goal: LTG:  Patient will perform sit to stand in prep for activites of daily living with assistance level (OT) Description: LTG:  Patient will perform sit to stand in prep for activites of daily living with assistance level (OT) Outcome: Completed/Met Flowsheets (Taken 05/01/2023 1249 by Warnell Forester, OT) LTG: PT will perform sit to stand in prep for activites of daily living with assistance level: Independent with assistive device   Problem: RH Bathing Goal: LTG Patient will bathe all body parts with assist levels (OT) Description: LTG: Patient will bathe all body parts with assist levels (OT) Outcome: Completed/Met Flowsheets (Taken 05/01/2023 1249 by Warnell Forester, OT) LTG: Pt will perform bathing with assistance level/cueing: Independent with assistive device    Problem: RH Dressing Goal: LTG Patient will perform upper body dressing (OT) Description: LTG Patient will perform upper body dressing with assist, with/without cues (OT). Outcome: Completed/Met Flowsheets (Taken 05/01/2023 1249 by Warnell Forester, OT) LTG: Pt will perform upper body dressing with assistance level of: Independent Goal: LTG Patient will perform lower body dressing w/assist (OT) Description: LTG: Patient will perform lower body dressing with assist, with/without cues in positioning using equipment (OT) Outcome: Completed/Met Flowsheets (Taken 05/01/2023 1249 by Warnell Forester, OT) LTG: Pt will perform lower body dressing with assistance level of: Independent with assistive device   Problem: RH  Toileting Goal: LTG Patient will perform toileting task (3/3 steps) with assistance level (OT) Description: LTG: Patient will perform toileting task (3/3 steps) with assistance level (OT)  Outcome: Completed/Met Flowsheets (Taken 05/01/2023 1249 by Warnell Forester, OT) LTG: Pt will perform toileting task (3/3 steps) with assistance level: Independent with assistive device   Problem: RH Simple Meal Prep Goal: LTG Patient will perform simple meal prep w/assist (OT) Description: LTG: Patient will perform simple meal prep with assistance, with/without cues (OT). Outcome: Completed/Met Flowsheets (Taken 05/01/2023 1249 by Warnell Forester, OT) LTG: Pt will perform simple meal prep with assistance level of: Independent with assistive device   Problem: RH Toilet Transfers Goal: LTG Patient will perform toilet transfers w/assist (OT) Description: LTG: Patient will perform toilet transfers with assist, with/without cues using equipment (OT) Outcome: Completed/Met Flowsheets (Taken 05/01/2023 1249 by Warnell Forester, OT) LTG: Pt will perform toilet transfers with assistance level of: Independent with assistive device   Problem: RH Tub/Shower Transfers Goal: LTG Patient will perform tub/shower transfers w/assist (OT) Description: LTG: Patient will perform tub/shower transfers with assist, with/without cues using equipment (OT) Outcome: Completed/Met Flowsheets (Taken 05/01/2023 1249 by Warnell Forester, OT) LTG: Pt will perform tub/shower stall transfers with assistance level of: Independent with assistive device

## 2023-05-12 NOTE — Plan of Care (Signed)
  Problem: RH Ambulation Goal: LTG Patient will ambulate in controlled environment (PT) Description: LTG: Patient will ambulate in a controlled environment, # of feet with assistance (PT). Outcome: Adequate for Discharge   Problem: RH Balance Goal: LTG Patient will maintain dynamic standing balance (PT) Description: LTG:  Patient will maintain dynamic standing balance with assistance during mobility activities (PT) Outcome: Completed/Met   Problem: Sit to Stand Goal: LTG:  Patient will perform sit to stand with assistance level (PT) Description: LTG:  Patient will perform sit to stand with assistance level (PT) Outcome: Completed/Met   Problem: RH Bed Mobility Goal: LTG Patient will perform bed mobility with assist (PT) Description: LTG: Patient will perform bed mobility with assistance, with/without cues (PT). Outcome: Completed/Met   Problem: RH Bed to Chair Transfers Goal: LTG Patient will perform bed/chair transfers w/assist (PT) Description: LTG: Patient will perform bed to chair transfers with assistance (PT). Outcome: Completed/Met   Problem: RH Stairs Goal: LTG Patient will ambulate up and down stairs w/assist (PT) Description: LTG: Patient will ambulate up and down # of stairs with assistance (PT) Outcome: Completed/Met

## 2023-05-12 NOTE — Progress Notes (Signed)
 PROGRESS NOTE   Subjective/Complaints:   Pt reports had multiple Bms last night- feels much better- asking if needs the colonoscopy prep- explained if she feels full of stool, yes, but she agreed didn't need it.   Explained will still need to take Miralax BID and Colace.   Also trying to get compounded rectal cream for rectal tear.  Admits "ate too much" for breakfast-  But we discussed focusing mor eon liquid diet until rectal tear healed.   Numbness "worse' in LUE, but stable since yesterday.    Askin gif needs O2 to go home with- admits O2 level comes "right up" when sits down after walking- hypoventilation syndrome? But upon review of chart, last few walking episodes, dropped to lowest 89% and came back to 96% within 15 seconds. Don't see need for O2 at home.    ROS:    Pt denies SOB, abd pain, CP, N/V/C/D, and vision changes Improved constipation Objective:   DG Abd 1 View Result Date: 05/10/2023 CLINICAL DATA:  Constipation. No bowel movement for several days. Midline abdominal pain. EXAM: ABDOMEN - 1 VIEW COMPARISON:  Lumbar spine radiographs 04/08/23. FINDINGS: Moderate stool is present at the rectum. Bowel gas pattern throughout the colon is otherwise within normal limits. No obstruction is present. Small bowel is unremarkable. No definite free air is present. IMPRESSION: 1. Moderate stool at the rectum. 2. No obstruction or other acute abnormality. Electronically Signed   By: Marin Roberts M.D.   On: 05/10/2023 12:12    No results for input(s): "WBC", "HGB", "HCT", "PLT" in the last 72 hours.   No results for input(s): "NA", "K", "CL", "CO2", "GLUCOSE", "BUN", "CREATININE", "CALCIUM" in the last 72 hours.    Intake/Output Summary (Last 24 hours) at 05/12/2023 0811 Last data filed at 05/12/2023 0700 Gross per 24 hour  Intake 710 ml  Output --  Net 710 ml        Physical Exam: Vital Signs Blood  pressure (!) 104/59, pulse 80, temperature 98 F (36.7 C), resp. rate 18, height 5\' 5"  (1.651 m), weight (!) 172.5 kg, last menstrual period 12/20/2017, SpO2 93%.    General: awake, alert, appropriate, sitting up slightly in bed; NAD HENT: conjugate gaze; oropharynx moist CV: regular rate and rhythm; no JVD Pulmonary: CTA B/L; no W/R/R- good air movement GI: soft, NT, ND, (+)BS Psychiatric: appropriate Neurological: Ox3   Skin- popped small abscess on bottom of R breast- with a lot of bruising surrounding like has been popped as well as splatters of blood on gown and pillow MSK- L shoulder trigger points palpated- are better today Extremities: no LE edema palpated at all- - looks good; not seen in hands- again in spite of K tape being removed  Reports altered sensation medial left hand Musculoskeletal:     Cervical back: Neck supple.     Moving all 4 extremities to gravity and resistance  TTP L periscapular muscles Tinels at elbow- equivocal Mild L trap pain with L shoulder abduction and ROM Improved somewhat- wearing lidoderm patch on posterior L shoulder, stable 3/1  Skin:    General: Skin is warm.     Comments: no LE edema B/L  Some eczema- not bad currently Heels dried/cracked,     Assessment/Plan: 1. Functional deficits which require 3+ hours per day of interdisciplinary therapy in a comprehensive inpatient rehab setting. Physiatrist is providing close team supervision and 24 hour management of active medical problems listed below. Physiatrist and rehab team continue to assess barriers to discharge/monitor patient progress toward functional and medical goals  Care Tool:  Bathing    Body parts bathed by patient: Right arm, Left arm, Chest, Abdomen, Front perineal area, Left upper leg, Right upper leg, Face, Buttocks, Left lower leg, Right lower leg   Body parts bathed by helper: Right lower leg, Left lower leg, Buttocks     Bathing assist Assist Level: Independent  with assistive device     Upper Body Dressing/Undressing Upper body dressing   What is the patient wearing?: Pull over shirt    Upper body assist Assist Level: Independent with assistive device    Lower Body Dressing/Undressing Lower body dressing      What is the patient wearing?: Underwear/pull up, Pants     Lower body assist Assist for lower body dressing: Independent with assitive device     Toileting Toileting    Toileting assist Assist for toileting: Independent with assistive device     Transfers Chair/bed transfer  Transfers assist     Chair/bed transfer assist level: Independent with assistive device     Locomotion Ambulation   Ambulation assist      Assist level: Contact Guard/Touching assist Assistive device: Walker-rolling Max distance: 244 ft   Walk 10 feet activity   Assist     Assist level: Contact Guard/Touching assist Assistive device: Walker-rolling   Walk 50 feet activity   Assist    Assist level: Contact Guard/Touching assist Assistive device: Walker-rolling    Walk 150 feet activity   Assist    Assist level: Contact Guard/Touching assist Assistive device: Walker-rolling    Walk 10 feet on uneven surface  activity   Assist Walk 10 feet on uneven surfaces activity did not occur: Safety/medical concerns         Wheelchair     Assist Is the patient using a wheelchair?: Yes (transport only) Type of Wheelchair: Manual    Wheelchair assist level: Dependent - Patient 0% Max wheelchair distance: 150    Wheelchair 50 feet with 2 turns activity    Assist        Assist Level: Dependent - Patient 0%   Wheelchair 150 feet activity     Assist      Assist Level: Dependent - Patient 0%   Blood pressure (!) 104/59, pulse 80, temperature 98 F (36.7 C), resp. rate 18, height 5\' 5"  (1.651 m), weight (!) 172.5 kg, last menstrual period 12/20/2017, SpO2 93%.  Medical Problem List and Plan: 1.  Functional deficits secondary to debility related to acute on chronic diastolic congestive heart failure with exacerbation- diuresed significant weight             -patient may  shower             -ELOS/Goals: 5-7 days mod I- doesn't have help at home  D/c today- constipation is resolved and has rectal tear- GI has seen pt- made recs  Will need nebs to go home with   2.  Antithrombotics: -DVT/anticoagulation:  Pharmaceutical: Eliquis             -antiplatelet therapy: N/A 3. Pain Management: Voltaren gel twice daily, Lyrica 200 mg twice daily, hydrocodone as needed  2/21- pain usually controlled- con't regimen  -2/23 check C spine xray due to shooting pain reported down L arm.  2/24- xray shows some degenerative changes, but nothing to explain L side specifically.   -her Robaxin extremely helpful  2/26- will add Kpad per pt/nursing- will do trP injections tomorrow  2/27- kpad helpful- will do trp injections today  2/28- so much better today  3/3- having more numbness in LUE- but pain is doing OK- con't regimen- might need EMG outpt if doesn't improve in next 1-2 months- now "entire" hand is numb  3/4- no change- will need to f/u with PCP about this 4. Mood/Behavior/Sleep: Melatonin 10 mg nightly             -antipsychotic agents: N/A 5. Neuropsych/cognition: This patient is capable of making decisions on her own behalf. 6. Skin/Wound Care: Routine skin checks 7. Fluids/Electrolytes/Nutrition: Routine in and outs with follow-up chemistries 8.  PAF/A-fib with RVR.  Cardioversion 04/28/2023.  Cardiac rate controlled.  Toprol-XL 75 mg daily  2/25- sounds regular- con't regimen 9.  E. coli UTI.  Completed course of Rocephin 10.  Right thigh cellulitis.  Completed course of Rocephin 11.  Gastroenteritis secondary to norovirus/diarrhea.  Improving.  Supportive care..  Enteric precautions- no Sx's for 4 days, can stop if OK with Infection prevention  2/21- can stop since been 5 days since last  Sx. Actually is constipated now  -2/23 LBM today. Pt with nausea today.  Check abd xray. Will order 4mg  zofran- discussed with pharmacy(appreciate assistance) as pt has intermittent elevated QT. QTC not elevated on last EKG. Check labs and replete Mg for goal 2.0 ,K+ for goal above 4. If she needs more than one dose consider EKG tomorrow.   Check orthostatic vital signs.   2/24- no nausea- had regular Bms x2 yesterday 12.  Asthma.  Continue inhalers as directed. check oxygen saturations every shift  2/27- needed nebs yesterday- will send home with nebs albuterol.  13.  OSA.  CPAP 14.  Acute on chronic CHF exacerbation.  Monitor for any signs of fluid overload.  Continue Bumex 1 mg twice daily, Aldactone 25 mg daily  2/21- weight up 4 kg- however bed was changed- will monitor trend- no LE edema  2/22 Will adjust Bumex timing to 3pm  3/2 weight significantly better  Filed Weights   05/10/23 0500 05/11/23 0500 05/12/23 0544  Weight: (!) 173.2 kg (!) 172.8 kg (!) 172.5 kg    2/25- Weight about the same, but pt feels more swollen- in hands and feet- will try 1 mg Bumex in addition to 1 mg BID  2/26- Will wait on bumex- weight down 4 kg- she didn't pee as much yesterday during day, but did last night- no signs of edema on exam- see how things go with weight in next 1 day.   2/27- so far looks like weight  might be up- since in bed- comparing to weight in bed yesterday, up 3 kg- will give afternoon Bumex if continues tomorrow-   3/1 additional 1mg  Bumex ordered  3/2: discussed that weight is much improved  3/4- weight down 1 lb- con't regimen 15.  Acute on chronic anemia.  D/c Niferex for now given severe constipation.  Check CBC  16.  Diabetes mellitus.  Latest hemoglobin A1c 6.7.  Presently on SSI.  Patient on Ozempic prior to admission.  2/21- BG's 109-229- only 1 value over 200- will see if family cane bring in Ozempic  2/23 fair control  CBGs, continue to monitor   2/27-2/28 Fair control-  family to bring in Ozempic. -she said will wait til gets home CBG (last 3)  Recent Labs    05/11/23 1803 05/11/23 2112 05/12/23 0622  GLUCAP 159* 152* 118*   17. Morbid Obesity.  BMI 62.77.  Dietary follow-up  2/21- weight 178.8 kg- up a lot form yesterday but has different bed- will monitor trend  2/24- weight back down- to 172.6 kg- which is weight she came over at.   2/25- Weight up 5 lbs since low of what she came over at- will do 1 mg Bumex x1  3/2: discussed weight decreased with Bumex 18. Eczema- usually takes Dupixant- will see if can restart. Pt can bring in from home 19. Constipation- LBM 4 days ago- wants to wait on Sorbitol  2/21- will give Sorbitol Saturday if no BM- so far it's been 5 days- no Bariatric BSC in room.   2/22 reports large BM yesterday, continue to monitor bowel function  2/23 LBM today  2/24- 4 Bms over weekend- going well again.   2/25- LBM 2 days ago- will monitor  2/26- LBM 3 days ago- wil add Miralax- if doesn't go, tomorrow, will add Sorbitol   2/27- Will give 45 cc Sorbitol today after therapy  GI consulted given severity of constipation  3/3- GI called- they agreed to try [facility] before we had them see her- has stool ball in rectum needs to get out- Called GI again  3/4- pt feeling much better this Am- strongly suggested liquid meals until the rectal tear heals.  20. Preventative- will do vaseline for heels and change to regular bed per pt- see if can get Zyrtec from home to take since don't have here.  21. Yeast infection -2/22 fluconazole 150mg  1x  -2/23 symptoms improved today  22. 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   I spent a total of 32   minutes on total care today- >50% coordination of care- due to prolonged d/w pt about issues per HPI- also d/w nursing and PA- about nebs and d/c plans.        LOS: 12 days A FACE TO FACE EVALUATION WAS PERFORMED  Dahmir Epperly 05/12/2023, 8:11 AM

## 2023-05-12 NOTE — Progress Notes (Signed)
 Inpatient Rehabilitation Care Coordinator Discharge Note   Patient Details  Name: Alyssa Blanchard MRN: [id number] Date of Birth: 10-16-1970   Discharge location: HOME WITH MOM WHO IS THERE BUT CAN NOT ASSIST  Length of Stay: 12 DAYS  Discharge activity level: MOD/I LEVEL  Home/community participation: ACTIVE  Patient response AO:ZHYQMV Literacy - How often do you need to have someone help you when you read instructions, pamphlets, or other written material from your doctor or pharmacy?: Rarely  Patient response HQ:IONGEX Isolation - How often do you feel lonely or isolated from those around you?: Never  Services provided included: MD, RD, PT, OT, RN, CM, TR, Pharmacy, Neuropsych, SW  Financial Services:  Field seismologist Utilized: Other (Comment) ([facility])    Choices offered to/list presented to: PT  Follow-up services arranged:  Home Health, DME, Patient/Family has no preference for HH/DME agencies Home Health Agency: [facility]  PT & OT    DME : [facility]  PCS REFERRAL MADE FOR AIDE SERVICES. TRANSPORTATION RESOURCES GIVEN TO HER ALSO  Patient response to transportation need: Is the patient able to respond to transportation needs?: Yes In the past 12 months, has lack of transportation kept you from medical appointments or from getting medications?: No In the past 12 months, has lack of transportation kept you from meetings, work, or from getting things needed for daily living?: No   Patient/Family verbalized understanding of follow-up arrangements:  Yes  Individual responsible for coordination of the follow-up plan: SELF [phone]  Confirmed correct DME delivered: Lucy Chris 05/12/2023    Comments (or additional information):PT DID WELL AND REACHED MOD/I LEVEL. DELAYED DC DUE TO BOWEL ISSUES.   Summary of Stay    Date/Time Discharge Planning CSW  05/05/23 0845 HOme with mom who can be there  but has declined with her health and can not assist pt. Pt will need to be mod/i to be safe at home at DC. Has DME have gotten HH agency to accept her medicaid RGD       Rivan Siordia, Lemar Livings

## 2023-05-19 ENCOUNTER — Other Ambulatory Visit (HOSPITAL_COMMUNITY): Payer: Self-pay

## 2023-05-19 ENCOUNTER — Other Ambulatory Visit: Payer: Self-pay

## 2023-05-20 ENCOUNTER — Other Ambulatory Visit (HOSPITAL_COMMUNITY): Payer: Self-pay

## 2023-05-22 ENCOUNTER — Other Ambulatory Visit (HOSPITAL_COMMUNITY): Payer: Self-pay

## 2023-05-22 ENCOUNTER — Encounter (HOSPITAL_COMMUNITY): Payer: Self-pay

## 2023-05-22 MED ORDER — LIDOCAINE 5 % EX PTCH
2.0000 | MEDICATED_PATCH | Freq: Every day | CUTANEOUS | 0 refills | Status: DC
Start: 2023-05-22 — End: 2023-08-14
  Filled 2023-05-22: qty 30, 15d supply, fill #0

## 2023-05-22 MED ORDER — DULERA 200-5 MCG/ACT IN AERO
2.0000 | INHALATION_SPRAY | Freq: Two times a day (BID) | RESPIRATORY_TRACT | 0 refills | Status: DC
Start: 2023-05-22 — End: 2023-08-31
  Filled 2023-05-22: qty 13, 30d supply, fill #0
  Filled ????-??-??: fill #0

## 2023-05-22 MED ORDER — METOPROLOL SUCCINATE ER 25 MG PO TB24
75.0000 mg | ORAL_TABLET | Freq: Every day | ORAL | 0 refills | Status: DC
  Filled 2023-05-22: qty 30, 10d supply, fill #0

## 2023-05-24 ENCOUNTER — Emergency Department (HOSPITAL_COMMUNITY)
Admission: EM | Admit: 2023-05-24 | Discharge: 2023-05-24 | Disposition: A | Discharge status: Home / Self Care | Attending: Emergency Medicine | Admitting: Emergency Medicine

## 2023-05-24 ENCOUNTER — Emergency Department (HOSPITAL_COMMUNITY)

## 2023-05-24 ENCOUNTER — Encounter (HOSPITAL_COMMUNITY): Payer: Self-pay

## 2023-05-24 ENCOUNTER — Other Ambulatory Visit: Payer: Self-pay

## 2023-05-24 DIAGNOSIS — R600 Localized edema: Secondary | ICD-10-CM | POA: Diagnosis not present

## 2023-05-24 DIAGNOSIS — J45909 Unspecified asthma, uncomplicated: Secondary | ICD-10-CM | POA: Diagnosis not present

## 2023-05-24 DIAGNOSIS — Z91040 Latex allergy status: Secondary | ICD-10-CM | POA: Diagnosis not present

## 2023-05-24 DIAGNOSIS — Z79899 Other long term (current) drug therapy: Secondary | ICD-10-CM | POA: Insufficient documentation

## 2023-05-24 DIAGNOSIS — E119 Type 2 diabetes mellitus without complications: Secondary | ICD-10-CM | POA: Diagnosis not present

## 2023-05-24 DIAGNOSIS — Z7901 Long term (current) use of anticoagulants: Secondary | ICD-10-CM | POA: Diagnosis not present

## 2023-05-24 DIAGNOSIS — R0789 Other chest pain: Secondary | ICD-10-CM | POA: Insufficient documentation

## 2023-05-24 DIAGNOSIS — I509 Heart failure, unspecified: Secondary | ICD-10-CM | POA: Diagnosis not present

## 2023-05-24 LAB — BASIC METABOLIC PANEL
Anion gap: 13 (ref 5–15)
BUN: 10 mg/dL (ref 6–20)
CO2: 25 mmol/L (ref 22–32)
Calcium: 9.1 mg/dL (ref 8.9–10.3)
Chloride: 96 mmol/L — ABNORMAL LOW (ref 98–111)
Creatinine, Ser: 0.79 mg/dL (ref 0.44–1.00)
GFR, Estimated: >60 mL/min (ref >60)
Glucose, Bld: 180 mg/dL — ABNORMAL HIGH (ref 70–99)
Potassium: 3.7 mmol/L (ref 3.5–5.1)
Sodium: 134 mmol/L — ABNORMAL LOW (ref 135–145)

## 2023-05-24 LAB — CBC
HCT: 37 % (ref 36.0–46.0)
Hemoglobin: 10.9 g/dL — ABNORMAL LOW (ref 12.0–15.0)
MCH: 26 pg (ref 26.0–34.0)
MCHC: 29.5 g/dL — ABNORMAL LOW (ref 30.0–36.0)
MCV: 88.1 fL (ref 80.0–100.0)
Platelets: 127 10*3/uL — ABNORMAL LOW (ref 150–400)
RBC: 4.2 MIL/uL (ref 3.87–5.11)
RDW: 16.1 % — ABNORMAL HIGH (ref 11.5–15.5)
WBC: 7.2 10*3/uL (ref 4.0–10.5)
nRBC: 0 % (ref 0.0–0.2)

## 2023-05-24 LAB — TROPONIN I (HIGH SENSITIVITY): Troponin I (High Sensitivity): 4 ng/L (ref <18)

## 2023-05-24 LAB — BRAIN NATRIURETIC PEPTIDE: B Natriuretic Peptide: 75 pg/mL (ref 0.0–100.0)

## 2023-05-24 MED ORDER — METOPROLOL SUCCINATE ER 25 MG PO TB24: 75.0000 mg | ORAL_TABLET | Freq: Every day | ORAL | 0 refills | Status: DC

## 2023-05-24 MED ORDER — HYDROCODONE-ACETAMINOPHEN 5-325 MG PO TABS
1.0000 | ORAL_TABLET | Freq: Once | ORAL | Status: AC
  Administered 2023-05-24: 1 via ORAL
  Filled 2023-05-24: qty 1

## 2023-05-24 MED ORDER — LIDOCAINE 5 % EX PTCH
1.0000 | MEDICATED_PATCH | CUTANEOUS | Status: DC
  Administered 2023-05-24: 1 via TRANSDERMAL
  Filled 2023-05-24: qty 1

## 2023-05-24 MED ORDER — HYDROCODONE-ACETAMINOPHEN 5-325 MG PO TABS: 1.0000 | ORAL_TABLET | Freq: Four times a day (QID) | ORAL | 0 refills | Status: DC | PRN

## 2023-05-24 NOTE — ED Notes (Signed)
 Patient transported to x-ray. ?

## 2023-05-24 NOTE — ED Triage Notes (Addendum)
 Pt c.o left sided chest pain since yesterday. Sharp, intermittent, worse when she breathes and moves.  Lungs clear DOE at baseline, no worsening with pain.  Pt a.o, VSS Pt given 324 ASA PTA

## 2023-05-24 NOTE — Discharge Instructions (Addendum)
 Follow-up with primary care doctor and also pain management doctor for your symptoms.  Take Tylenol daily.  You can also use lidocaine patches and ice and heat over area of pain.  You can take Norco for breakthrough pain.  Return to ER with new or worsening symptoms.

## 2023-05-24 NOTE — ED Provider Notes (Signed)
 [place] EMERGENCY DEPARTMENT AT [facility] Provider Note   CSN: [id number] Arrival date & time: 05/24/23  1259     History  Chief Complaint  Patient presents with   Chest Pain    Alyssa Blanchard is a 53 y.o. female with past medical medical history of asthma, morbid obesity, obesity hypoventilation syndrome, congestive heart failure, A-fib on Eliquis, diabetes to emergency room with complaint of 24 hours of left-sided chest pain.  Patient reports pain is constant it does not radiate anywhere.  It is worse when she moves and takes a deep breath in.  Associated with shortness of breath with exertion however patient reports it does not feel much different than baseline.  Reports she has been taking her Bumex at home.  Notes weight gain since prior hospital admission.  Also reports that she thinks that her pinched nerve in her neck is causing her chest and back pain.  Has been taking muscle relaxers and Tylenol at home without improvement.  Denies abdominal pain nausea vomiting diarrhea or dysuria.  No fevers or chills at home.   Chest Pain      Home Medications Prior to Admission medications   Medication Sig Start Date End Date Taking? Authorizing Provider  acetaminophen (TYLENOL) 325 MG tablet Take 2 tablets (650 mg total) by mouth every 6 (six) hours as needed for mild pain (pain score 1-3) (or Fever >/= 101). 05/10/23   Angiulli, Mcarthur Rossetti, PA-C  albuterol (PROVENTIL) (2.5 MG/3ML) 0.083% nebulizer solution Take 3 mLs by nebulization every 4 (four) hours as needed for wheezing or shortness of breath. 05/12/23   Angiulli, Mcarthur Rossetti, PA-C  apixaban (ELIQUIS) 5 MG TABS tablet Take 1 tablet (5 mg total) by mouth 2 (two) times daily. 05/11/23 06/10/23  Angiulli, Mcarthur Rossetti, PA-C  bisacodyl (DULCOLAX) 5 MG EC tablet Take 1 tablet (5 mg total) by mouth daily as needed for moderate constipation. 05/11/23   Angiulli, Mcarthur Rossetti, PA-C  bumetanide (BUMEX) 1 MG tablet Take 1 tablet (1 mg total) by  mouth 2 (two) times daily. 05/11/23 06/10/23  Angiulli, Mcarthur Rossetti, PA-C  cetirizine (ZYRTEC) 10 MG tablet Take 1 tablet (10 mg total) by mouth daily. 05/11/23   Angiulli, Mcarthur Rossetti, PA-C  diclofenac Sodium (VOLTAREN ARTHRITIS PAIN) 1 % GEL Apply 2 g topically 2 (two) times daily. 05/11/23   Angiulli, Mcarthur Rossetti, PA-C  diltiazem 2 % GEL Apply 1 Application topically 3 (three) times daily. 05/12/23   Angiulli, Mcarthur Rossetti, PA-C  docusate sodium (COLACE) 100 MG capsule Take 2 capsules (200 mg total) by mouth at bedtime. 05/12/23   Angiulli, Mcarthur Rossetti, PA-C  doxycycline (VIBRA-TABS) 100 MG tablet Take 1 tablet (100 mg total) by mouth every 12 (twelve) hours. 05/11/23   Angiulli, Mcarthur Rossetti, PA-C  DULERA 200-5 MCG/ACT AERO Inhale 2 puffs into the lungs in the morning and at bedtime. 05/22/23     Dupilumab (DUPIXENT) 300 MG/2ML SOPN Inject 300 mg into the skin every 14 (fourteen) days.    [provider]  HYDROcodone-acetaminophen (NORCO) 10-325 MG tablet Take 1-2 tablets by mouth every 6 (six) hours as needed for moderate pain (pain score 4-6). 05/11/23   Angiulli, Mcarthur Rossetti, PA-C  iron polysaccharides (NIFEREX) 150 MG capsule Take 1 capsule by mouth daily. 03/26/23   [provider]  lidocaine (LIDODERM) 5 % Place 2 patches onto the skin daily. 05/22/23     Lifitegrast (XIIDRA) 5 % SOLN Place 1 drop into both eyes in the  morning and at bedtime.    [provider]  LORazepam (ATIVAN) 0.5 MG tablet Take 1 tablet (0.5 mg total) by mouth every 6 (six) hours as needed for anxiety (Nausea). 05/11/23   Angiulli, Mcarthur Rossetti, PA-C  magnesium oxide (MAG-OX) 400 MG tablet Take 1 tablet (400 mg total) by mouth daily. 05/11/23   Angiulli, Mcarthur Rossetti, PA-C  melatonin 5 MG TABS Take 2 tablets (10 mg total) by mouth at bedtime. 05/11/23   Angiulli, Mcarthur Rossetti, PA-C  methocarbamol (ROBAXIN) 500 MG tablet Take 1 tablet (500 mg total) by mouth every 6 (six) hours as needed for muscle spasms. 05/11/23   Angiulli, Mcarthur Rossetti, PA-C  metoprolol  succinate (TOPROL-XL) 25 MG 24 hr tablet Take 3 tablets (75 mg total) by mouth daily. 05/22/23     Multiple Vitamins-Minerals (BARIATRIC MULTIVITAMINS/IRON PO) Take 1 tablet by mouth daily with breakfast.    [provider]  pantoprazole (PROTONIX) 20 MG tablet Take 2 tablets (40 mg total) by mouth 2 (two) times daily. 05/11/23   Angiulli, Mcarthur Rossetti, PA-C  polyethylene glycol (MIRALAX / GLYCOLAX) 17 g packet Take 17 g by mouth daily. 05/11/23   Angiulli, Mcarthur Rossetti, PA-C  pregabalin (LYRICA) 200 MG capsule Take 1 capsule (200 mg total) by mouth 2 (two) times daily. 05/11/23   Angiulli, Mcarthur Rossetti, PA-C  senna-docusate (SENOKOT-S) 8.6-50 MG tablet Take 1 tablet by mouth at bedtime as needed for mild constipation. 05/11/23   Angiulli, Mcarthur Rossetti, PA-C  silver sulfADIAZINE (SILVADENE) 1 % cream Apply topically daily. Apply to right side 05/11/23   Angiulli, Mcarthur Rossetti, PA-C  spironolactone (ALDACTONE) 25 MG tablet Take 1 tablet (25 mg total) by mouth daily. 05/11/23   Angiulli, Mcarthur Rossetti, PA-C  VENTOLIN HFA 108 (90 Base) MCG/ACT inhaler Inhale 2 puffs into the lungs every 6 (six) hours as needed for shortness of breath. 05/11/23   Angiulli, Mcarthur Rossetti, PA-C      Allergies    Gabapentin, Topiramate, Albuterol, Hydroxyzine, Imitrex [sumatriptan], Latex, Montelukast, Other, Prozac [fluoxetine], Trazodone and nefazodone, Advair diskus [fluticasone-salmeterol], and Copper-containing compounds    Review of Systems   Review of Systems  Cardiovascular:  Positive for chest pain.    Physical Exam Updated Vital Signs BP (!) 147/85 (BP Location: Right Wrist)   Pulse 79   Temp 98 F (36.7 C) (Oral)   Resp 18   LMP 12/20/2017   SpO2 100% Comment: Simultaneous filing. User may not have seen previous data. Physical Exam Vitals and nursing note reviewed.  Constitutional:      General: She is not in acute distress.    Appearance: She is not toxic-appearing.  HENT:     Head: Normocephalic and atraumatic.  Eyes:      General: No scleral icterus.    Conjunctiva/sclera: Conjunctivae normal.  Cardiovascular:     Rate and Rhythm: Normal rate and regular rhythm.     Pulses: Normal pulses.     Heart sounds: Normal heart sounds.  Pulmonary:     Effort: Pulmonary effort is normal. No respiratory distress.     Breath sounds: Normal breath sounds. No wheezing.  Chest:     Chest wall: Tenderness present.  Abdominal:     General: Abdomen is flat. Bowel sounds are normal.     Palpations: Abdomen is soft.     Tenderness: There is no abdominal tenderness.  Musculoskeletal:     Right lower leg: Edema present.     Left lower leg: Edema present.  Skin:  General: Skin is warm and dry.     Findings: No lesion.  Neurological:     General: No focal deficit present.     Mental Status: She is alert and oriented to person, place, and time. Mental status is at baseline.     ED Results / Procedures / Treatments   Labs (all labs ordered are listed, but only abnormal results are displayed) Labs Reviewed  CBC - Abnormal; Notable for the following components:      Result Value   Hemoglobin 10.9 (*)    MCHC 29.5 (*)    RDW 16.1 (*)    Platelets 127 (*)    All other components within normal limits  BASIC METABOLIC PANEL  BRAIN NATRIURETIC PEPTIDE  TROPONIN I (HIGH SENSITIVITY)    EKG None  Radiology DG Chest 2 View Result Date: 05/24/2023 CLINICAL DATA:  Chest pain EXAM: CHEST - 2 VIEW COMPARISON:  04/26/2023 FINDINGS: Stable cardiomegaly. Mild pulmonary vascular congestion. No overt pulmonary edema. Linear scarring or atelectasis in the perihilar regions. No pleural effusion or pneumothorax. IMPRESSION: Cardiomegaly with mild pulmonary vascular congestion. Electronically Signed   By: Duanne Guess D.O.   On: 05/24/2023 13:48    Procedures Procedures    Medications Ordered in ED Medications - No data to display  ED Course/ Medical Decision Making/ A&P                                 Medical  Decision Making Amount and/or Complexity of Data Reviewed Labs: ordered. Radiology: ordered.  Risk Prescription drug management.   Alyssa Blanchard 53 y.o. presented today for chest pain. Working DDx that I considered at this time includes, but not limited to, ACS, GERD, pe, pna, aortic dissection, pneumothorax, MSK path, anemia, esophageal rupture, CHF exacerbation, valvular disorder, myocarditis, pericarditis, endocarditis, pericardial effusion/cardiac tamponade, pulmonary edema, gastritis/PUD, esophagitis.  PMHX: asthma, morbid obesity, obesity hypoventilation syndrome, congestive heart failure, A-fib on Eliquis, diabetes   Review of prior external notes: 05/12/23   Unique Tests and My Interpretation:  CBC without leukocytosis and hemoglobin 10.9 which is similar to prior recent labs. BMP without significant electrolyte abnormality.  She has normal kidney function.  Potassium 3.7 Trop 4, repeat not needed for to duration of symptoms.  BNP 75  EKG shows normal sinus rhythm.  Chest x-ray with mild vascular congestion consistent with CHF.  Problem List / ED Course / Critical interventions / Medication management  Patient reporting to emergency room with complaint of left-sided anterior chest pain.  Reports started 24 hours ago.  She is unsure what started it but denies any injury trauma or fall.  She reports since associated with shortness of breath.  Reports she is not short of breath when she lays down flat nor when she is sitting.  Reports shortness of breath occurs with ambulation.  She reports this is not much different from her baseline.  Doubt PE as cause of symptoms as she is on Eliquis and has not missed a dose.  This does not sound like dissection and your strong radial pulse equal bilaterally.  Will check chest x-ray and BNP to rule out congestive heart failure.  She does have some mild lower extremity edema without any pitting edema.  Doubt ACS as EKG is reassuring and initial  troponin 4, will not repeat troponin.  No sign of pneumonia or pneumothorax on x-ray.  She is hemodynamically stable and well-appearing.  She is not hypoxic.  She also reports that she is having some upper neck pain from a pinched nerve.  Exam is most consistent with musculoskeletal pain.  Pain is reproducible with palpation worse when she is twisting or bending side-to-side and worse taking deep breath in.  She is requesting something for her neck pain which is chronic, sees spine and getting MRI.  Reports she is seeing orthopedics for this doctor and she is getting referred to pain management specialist. Patient was discharged from hospital 12 days ago in which she weighed 172 kg. Weight today 174.5 kg  I ordered medication including Norco and lidocaine patch for pain control - patient reports symptoms have improved.  Reevaluation of the patient after these medicines showed that the patient improved Patients vitals assessed. Upon arrival patient is  hemodynamically stable.  I have reviewed the patients home medicines and have made adjustments as needed    Plan:  F/u w/ PCP to ensure resolution of sx.  Patient was given return precautions. Patient stable for discharge at this time.  Patient educated on sx/ dx and verbalized understanding of plan.  Will return to ER w/ new or worsening sx.          Final Clinical Impression(s) / ED Diagnoses Final diagnoses:  Chest wall pain    Rx / DC Orders ED Discharge Orders     None         Smitty Knudsen, PA-C 05/24/23 1511    Rondel Baton, MD 05/29/23 1125

## 2023-05-25 ENCOUNTER — Other Ambulatory Visit (HOSPITAL_COMMUNITY): Payer: Self-pay

## 2023-05-26 ENCOUNTER — Inpatient Hospital Stay (HOSPITAL_COMMUNITY): Admit: 2023-05-26 | Discharge: 2023-05-26 | Disposition: A | Discharge status: Home / Self Care | Payer: Medicaid Other

## 2023-05-28 ENCOUNTER — Other Ambulatory Visit (HOSPITAL_COMMUNITY): Payer: Self-pay

## 2023-05-29 ENCOUNTER — Telehealth (HOSPITAL_COMMUNITY): Payer: Self-pay

## 2023-05-29 NOTE — Telephone Encounter (Signed)
 Called to confirm/remind patient of their appointment at [facility] on 06/01/2023 2:00.   Appointment:   [x] Confirmed  [] Left mess   [] No answer/No voice mail  [] Phone not in service  Patient reminded to bring all medications and/or complete list.  Confirmed patient has transportation. Gave directions, instructed to utilize valet parking.

## 2023-05-31 NOTE — Progress Notes (Signed)
 HEART & VASCULAR TRANSITION OF CARE CONSULT NOTE     Referring Physician: Dr. Ella Jubilee PCP: Randel Pigg, Dorma Russell, MD  EP: Dr. Rudolpho Sevin Cardiologist: Dr. Jodie Echevaria ([facility])  HPI: Referred to clinic by Dr. Ella Jubilee for heart failure consultation.   Deem Carmelina Balducci is a 53 y.o. female with history of HFpEF, untreated OSA/OHS, asthma, PAF, morbid obesity w/ prior laparoscopic sleeve gastrectomy in 2016, DM II, HTN.   R/LHC at [facility] 9/23: RA mean 24, PA mean 49, PCWP mean 34, PVR 1.3 WU, Fick CI 2.01, TD CI 4.07, LVEDP 32, no CAD  Admitted to [facility] in 1/25 with acute on chronic HFpEF and RLE cellulitis.  She was readmitted to [facility] in 2/25 with volume overload, Afib with RVR. Echo with EF 70-75%, RV okay, RVSP 39 mmHg. Diuresed with IV lasix. Later underwent TEE/DCCV to SR. She was also treated for norovirus, RLE cellulitis, E. Coli UTI then candida vaginosis. Discharged to CIR, weight 392 lbs.  She saw [facility] AHF/transplant team for consult 05/29/23.   Today she presents to [facility] for post hospital HF follow up. She saw Gillian Shields, NP with general cardiology earlier today. She has SOB with ADLs, this is her baseline. She has rare, atypical chest pain, worse with exertion. Legs and belly are swelling. Occasional BRBpR. Occasional positional dizziness, no falls. Walks with a cane for balance. Denies palpitations,   or PND/Orthopnea. Chronically sleeps in a recliner x years. Appetite poor, has early satiety. No fever or chills. Weight at home 390 pounds. Taking all medications. She snores.   Social Hx: lives with her mother (who has CKD IV), she is insured and has transportation, unemployed Family Hx: grandmothers and maternal grandfather with CAD & CHF.  Past Medical History:  Diagnosis Date   Acute on chronic congestive heart failure (HCC) 05/02/2023   Anxiety    Asthma    Atrial fibrillation (HCC)    Depression    DM (diabetes mellitus), type 2 (HCC) 02/16/2022   Dysrhythmia    new onset  Afib rvr   GERD (gastroesophageal reflux disease)    Heart failure (HCC)    Heel spur    bilat   History of bronchitis    History of chicken pox    History of urinary tract infection    Hypertension    Migraines    Plantar fasciitis    bilat   STD (sexually transmitted disease)    chl hx & hsv 1&2   Tremors of nervous system    Urinary incontinence    Current Outpatient Medications  Medication Sig Dispense Refill   acetaminophen (TYLENOL) 325 MG tablet Take 2 tablets (650 mg total) by mouth every 6 (six) hours as needed for mild pain (pain score 1-3) (or Fever >/= 101).     albuterol (PROVENTIL) (2.5 MG/3ML) 0.083% nebulizer solution Take 3 mLs by nebulization every 4 (four) hours as needed for wheezing or shortness of breath. 90 mL 12   apixaban (ELIQUIS) 5 MG TABS tablet Take 1 tablet (5 mg total) by mouth 2 (two) times daily. 60 tablet 0   bisacodyl (DULCOLAX) 5 MG EC tablet Take 1 tablet (5 mg total) by mouth daily as needed for moderate constipation.     bumetanide (BUMEX) 1 MG tablet Take 1 tablet (1 mg total) by mouth 2 (two) times daily. 60 tablet 0   cetirizine (ZYRTEC) 10 MG tablet Take 1 tablet (10 mg total) by mouth daily. 30 tablet 0   diclofenac Sodium (  VOLTAREN ARTHRITIS PAIN) 1 % GEL Apply 2 g topically 2 (two) times daily. 50 g 0   diltiazem 2 % GEL Apply 1 Application topically 3 (three) times daily. 45 g 0   docusate sodium (COLACE) 100 MG capsule Take 2 capsules (200 mg total) by mouth at bedtime.     doxycycline (VIBRA-TABS) 100 MG tablet Take 1 tablet (100 mg total) by mouth every 12 (twelve) hours. 2 tablet 0   DULERA 200-5 MCG/ACT AERO Inhale 2 puffs into the lungs in the morning and at bedtime. 13 g 0   Dupilumab (DUPIXENT) 300 MG/2ML SOPN Inject 300 mg into the skin every 14 (fourteen) days.     lidocaine (LIDODERM) 5 % Place 2 patches onto the skin daily. 30 patch 0   Lifitegrast (XIIDRA) 5 % SOLN Place 1 drop into both eyes in the morning and at bedtime.      magnesium oxide (MAG-OX) 400 MG tablet Take 1 tablet (400 mg total) by mouth daily. 30 tablet 0   melatonin 5 MG TABS Take 2 tablets (10 mg total) by mouth at bedtime. 60 tablet 0   methocarbamol (ROBAXIN) 500 MG tablet Take 1 tablet (500 mg total) by mouth every 6 (six) hours as needed for muscle spasms. 60 tablet 0   metoprolol succinate (TOPROL-XL) 25 MG 24 hr tablet Take 3 tablets (75 mg total) by mouth daily. 90 tablet 0   Multiple Vitamins-Minerals (BARIATRIC MULTIVITAMINS/IRON PO) Take 1 tablet by mouth daily with breakfast.     pantoprazole (PROTONIX) 20 MG tablet Take 2 tablets (40 mg total) by mouth 2 (two) times daily. 120 tablet 0   polyethylene glycol (MIRALAX / GLYCOLAX) 17 g packet Take 17 g by mouth daily.     pregabalin (LYRICA) 200 MG capsule Take 1 capsule (200 mg total) by mouth 2 (two) times daily. 60 capsule 0   senna-docusate (SENOKOT-S) 8.6-50 MG tablet Take 1 tablet by mouth at bedtime as needed for mild constipation.     silver sulfADIAZINE (SILVADENE) 1 % cream Apply topically daily. Apply to right side 50 g 0   spironolactone (ALDACTONE) 25 MG tablet Take 1 tablet (25 mg total) by mouth daily. 30 tablet 0   VENTOLIN HFA 108 (90 Base) MCG/ACT inhaler Inhale 2 puffs into the lungs every 6 (six) hours as needed for shortness of breath. 18 g 0   iron polysaccharides (NIFEREX) 150 MG capsule Take 1 capsule by mouth daily. (Patient not taking: Reported on 06/01/2023)     LORazepam (ATIVAN) 0.5 MG tablet Take 1 tablet (0.5 mg total) by mouth every 6 (six) hours as needed for anxiety (Nausea). (Patient not taking: Reported on 06/01/2023) 10 tablet 0   No current facility-administered medications for this encounter.   Facility-Administered Medications Ordered in Other Encounters  Medication Dose Route Frequency Provider Last Rate Last Admin   acetaminophen (TYLENOL) tablet 650 mg  650 mg Oral Once Thayil, Irene T, PA-C       ferric derisomaltose (MONOFERRIC) 1,000 mg in sodium  chloride 0.9 % 100 mL infusion  1,000 mg Intravenous Once Thayil, Irene T, PA-C       Allergies  Allergen Reactions   Gabapentin Anaphylaxis, Nausea And Vomiting and Other (See Comments)    "Cannot walk"   Topiramate Other (See Comments)    Jaw tightness and chest discomfort    Albuterol Other (See Comments)    CAN tolerate levalbuterol   Hydroxyzine Other (See Comments)    QT prolongation related  Imitrex [Sumatriptan] Nausea And Vomiting and Other (See Comments)    Increased heart rate   Latex Itching   Metformin Other (See Comments)    Interaction with heart medications   Montelukast Other (See Comments)    Nightmares    Other Itching and Other (See Comments)    Plastic and artificial Christmas trees = Itching   Prozac [Fluoxetine] Other (See Comments)    Bad, vivid dreams   Semaglutide Nausea And Vomiting    nausea   Trazodone And Nefazodone Other (See Comments)    Bad dreams 01/27/20 Pt is unsure if allergic to Nefazodone   Advair Diskus [Fluticasone-Salmeterol] Palpitations   Copper-Containing Compounds Rash and Other (See Comments)    Makes the face burn, also   Social History   Socioeconomic History   Marital status: Single    Spouse name: Not on file   Number of children: 0   Years of education: 12   Highest education level: Some college, no degree  Occupational History   Occupation: Chartered certified accountant: [employer]   Occupation: Unemployed  Tobacco Use   Smoking status: Former    Current packs/day: 0.00    Average packs/day: 0.3 packs/day for 5.0 years (1.3 ttl pk-yrs)    Types: Cigarettes    Start date: 03/11/2007    Quit date: 03/10/2012    Years since quitting: 11.2   Smokeless tobacco: Never  Vaping Use   Vaping status: Never Used  Substance and Sexual Activity   Alcohol use: Not Currently    Comment: Reports history of binge drinking; last in 2021   Drug use: Not Currently    Types: Marijuana, Cocaine    Comment: last time used 10 years    Sexual activity: Yes    Partners: Male    Birth control/protection: Condom  Other Topics Concern   Not on file  Social History Narrative   Regular exercise-no   Caffeine Use-yes, 1-2 cups of caffeine daily   Social Drivers of Health   Financial Resource Strain: Low Risk  (04/30/2023)   Overall Financial Resource Strain (CARDIA)    Difficulty of Paying Living Expenses: Not very hard  Food Insecurity: Low Risk  (05/27/2023)   Received from [facility]   Hunger Vital Sign    Worried About Running Out of Food in the Last Year: Never true    Ran Out of Food in the Last Year: Never true  Transportation Needs: No Transportation Needs (05/27/2023)   Received from [facility]    In the past 12 months, has lack of reliable transportation kept you from medical appointments, meetings, work or from getting things needed for daily living? : No  Recent Concern: Transportation Needs - Unmet Transportation Needs (04/24/2023)   PRAPARE - Administrator, Civil Service (Medical): Yes    Lack of Transportation (Non-Medical): No  Physical Activity: Inactive (01/29/2023)   Received from [facility]   Exercise Vital Sign    Days of Exercise per Week: 0 days    Minutes of Exercise per Session: 0 min  Stress: Stress Concern Present (11/24/2022)   [employer] of Occupational Health - Occupational Stress Questionnaire    Feeling of Stress : Rather much  Social Connections: Socially Isolated (11/24/2022)   Social Connection and Isolation Panel [NHANES]    Frequency of Communication with Friends and Family: Three times a week    Frequency of Social Gatherings with Friends and Family: Three times a  week    Attends Religious Services: Never    Active Member of Clubs or Organizations: No    Attends Banker Meetings: Not on file    Marital Status: Never married  Intimate Partner Violence: Not At Risk (04/24/2023)   Humiliation, Afraid, Rape, and Kick  questionnaire    Fear of Current or Ex-Partner: No    Emotionally Abused: No    Physically Abused: No    Sexually Abused: No   Family History  Problem Relation Age of Onset   Diabetes Mother    Hypertension Mother    Hyperlipidemia Mother    Kidney disease Mother        CKD stage 4   Pancreatic cancer Father        pancreatic   Hypertension Maternal Grandmother    Diabetes Maternal Grandmother    Heart failure Maternal Grandmother        CHF   Heart attack Maternal Grandfather    Hypertension Maternal Grandfather    Hypertension Paternal Grandmother    Alzheimer's disease Paternal Grandmother    Hypertension Paternal Grandfather    Cancer Maternal Uncle        melanoma   Cancer Paternal Uncle        kidney   Colon cancer Neg Hx    Esophageal cancer Neg Hx    Stomach cancer Neg Hx    Wt Readings from Last 3 Encounters:  06/01/23 (!) 176.9 kg (390 lb)  06/01/23 (!) 178.1 kg (392 lb 9.6 oz)  05/24/23 (!) 174.5 kg (384 lb 11.2 oz)   BP 138/64 (BP Location: Right Wrist, Patient Position: Sitting, Cuff Size: Large)   Ht 5\' 5"  (1.651 m)   Wt (!) 176.9 kg (390 lb)   LMP 12/20/2017   BMI 64.90 kg/m   PHYSICAL EXAM: General:  NAD. No resp difficulty, walked into clinic with cane HEENT: Normal Neck: Supple. Thick neck, JVP 8-10 Cor: Regular rate & rhythm. No rubs, gallops or murmurs. Lungs: Clear, diminished in bases Abdomen: Soft, obese, nontender, nondistended.  Extremities: No cyanosis, clubbing, rash, +LE lymphedema Neuro: Alert & oriented x 3, moves all 4 extremities w/o difficulty. Affect pleasant.  ECG (personally reviewed): NSR 82 bpm  ASSESSMENT & PLAN: HFpEF - R/LHC at [facility] 9/23: no CAD; RA mean 24, PA mean 49, PCWP mean 34, PVR 1.3 WU, Fick CI 2.01, TD CI 4.07, LVEDP 32  - Echo 2/25: EF 70-75%, RV okay, RVSP 39 mmHg - NYHA III-IIIb, suspect super morbid obesity and OSA/OHS are major contributing factors - Volume difficult due to body habitus, but suspect  she is mildly volume overloaded - Take metolazone 2.5 mg + 40 KCL x 1 dose with tomorrow morning's Bumex - Continue Bumex to 2 mg bid, add 20 KCl daily. - Continue spiro 25 mg daily - Continue Toprol XL 75 mg daily (does not need for dHF, continue for PAF) - No SGLT2i with recent UTI. Concern she would be at high risk for recurrent infections. - We discussed management of her dHF entails weight loss, treatment of OSA and diuresis. - We briefly discussed Furoscix, she would be a good candidate should she need it down the road. - Too large for Cardiomems - Likely will need RHC soon - Labs today.  2. PAF - previously on Tikosyn 03/2022 - s/p DCCV 2/25 - She sees EP, Dr. Rudolpho Sevin at [facility] - NSR on ECG today - Continue Eliquis 5 mg bid - CBC today  3. HTN -  BP stable - GDMT as above - Consider addition of ARB next - Needs sleep study  4. Morbid Obesity - Body mass index is 64.9 kg/m. - s/p gastric sleeve. - ? GLP1RA. Insurance is a barrier.  5. Snoring - Suspect sleep apnea - Follows with Dr. Craige Cotta, who is arranging sleep study  Referred to HFSW (PCP, Medications, Transportation, ETOH Abuse, Drug Abuse, Insurance, Financial ): No Refer to Pharmacy: No Refer to Home Health: No Refer to Advanced Heart Failure Clinic: Yes, assign to Dr. Elwyn Lade Refer to General Cardiology: No, established with Dr. Jodie Echevaria ([facility])  Refer to AHF clinic in [place] (AHF clinic at [facility] is too far for her to drive). Follow up with Dr. Elwyn Lade at next available.  Prince Rome, FNP-BC 06/01/23

## 2023-06-01 ENCOUNTER — Ambulatory Visit (HOSPITAL_COMMUNITY)
Admission: RE | Admit: 2023-06-01 | Discharge: 2023-06-01 | Disposition: A | Discharge status: Home / Self Care | Source: Ambulatory Visit | Attending: Family Medicine | Admitting: Family Medicine

## 2023-06-01 ENCOUNTER — Encounter (HOSPITAL_COMMUNITY): Payer: Self-pay

## 2023-06-01 ENCOUNTER — Other Ambulatory Visit (HOSPITAL_COMMUNITY): Payer: Self-pay

## 2023-06-01 ENCOUNTER — Ambulatory Visit (HOSPITAL_BASED_OUTPATIENT_CLINIC_OR_DEPARTMENT_OTHER): Payer: Medicaid Other | Admitting: Family

## 2023-06-01 ENCOUNTER — Telehealth: Payer: Self-pay

## 2023-06-01 ENCOUNTER — Encounter (HOSPITAL_BASED_OUTPATIENT_CLINIC_OR_DEPARTMENT_OTHER): Payer: Self-pay | Admitting: Family

## 2023-06-01 VITALS — BP 118/70 | HR 70 | Ht 65.0 in | Wt 392.6 lb

## 2023-06-01 VITALS — BP 138/64 | Ht 65.0 in | Wt 390.0 lb

## 2023-06-01 DIAGNOSIS — I480 Paroxysmal atrial fibrillation: Secondary | ICD-10-CM

## 2023-06-01 DIAGNOSIS — I10 Essential (primary) hypertension: Secondary | ICD-10-CM

## 2023-06-01 DIAGNOSIS — G4733 Obstructive sleep apnea (adult) (pediatric): Secondary | ICD-10-CM | POA: Diagnosis not present

## 2023-06-01 DIAGNOSIS — Z79899 Other long term (current) drug therapy: Secondary | ICD-10-CM | POA: Insufficient documentation

## 2023-06-01 DIAGNOSIS — Z9884 Bariatric surgery status: Secondary | ICD-10-CM | POA: Insufficient documentation

## 2023-06-01 DIAGNOSIS — Z5982 Transportation insecurity: Secondary | ICD-10-CM | POA: Insufficient documentation

## 2023-06-01 DIAGNOSIS — Z7901 Long term (current) use of anticoagulants: Secondary | ICD-10-CM | POA: Diagnosis not present

## 2023-06-01 DIAGNOSIS — I130 Hypertensive heart and chronic kidney disease with heart failure and stage 1 through stage 4 chronic kidney disease, or unspecified chronic kidney disease: Secondary | ICD-10-CM | POA: Insufficient documentation

## 2023-06-01 DIAGNOSIS — E1122 Type 2 diabetes mellitus with diabetic chronic kidney disease: Secondary | ICD-10-CM | POA: Insufficient documentation

## 2023-06-01 DIAGNOSIS — Z6841 Body Mass Index (BMI) 40.0 and over, adult: Secondary | ICD-10-CM | POA: Insufficient documentation

## 2023-06-01 DIAGNOSIS — D6859 Other primary thrombophilia: Secondary | ICD-10-CM

## 2023-06-01 DIAGNOSIS — J45909 Unspecified asthma, uncomplicated: Secondary | ICD-10-CM | POA: Diagnosis not present

## 2023-06-01 DIAGNOSIS — Z833 Family history of diabetes mellitus: Secondary | ICD-10-CM | POA: Insufficient documentation

## 2023-06-01 DIAGNOSIS — I5032 Chronic diastolic (congestive) heart failure: Secondary | ICD-10-CM | POA: Insufficient documentation

## 2023-06-01 DIAGNOSIS — Z8249 Family history of ischemic heart disease and other diseases of the circulatory system: Secondary | ICD-10-CM | POA: Diagnosis not present

## 2023-06-01 DIAGNOSIS — N184 Chronic kidney disease, stage 4 (severe): Secondary | ICD-10-CM | POA: Insufficient documentation

## 2023-06-01 DIAGNOSIS — R0683 Snoring: Secondary | ICD-10-CM

## 2023-06-01 DIAGNOSIS — R5381 Other malaise: Secondary | ICD-10-CM

## 2023-06-01 LAB — CBC
HCT: 38.4 % (ref 36.0–46.0)
Hemoglobin: 11.5 g/dL — ABNORMAL LOW (ref 12.0–15.0)
MCH: 25.4 pg — ABNORMAL LOW (ref 26.0–34.0)
MCHC: 29.9 g/dL — ABNORMAL LOW (ref 30.0–36.0)
MCV: 85 fL (ref 80.0–100.0)
Platelets: 181 10*3/uL (ref 150–400)
RBC: 4.52 MIL/uL (ref 3.87–5.11)
RDW: 16.8 % — ABNORMAL HIGH (ref 11.5–15.5)
WBC: 9.5 10*3/uL (ref 4.0–10.5)
nRBC: 0 % (ref 0.0–0.2)

## 2023-06-01 LAB — BASIC METABOLIC PANEL
Anion gap: 13 (ref 5–15)
BUN: 11 mg/dL (ref 6–20)
CO2: 28 mmol/L (ref 22–32)
Calcium: 9.5 mg/dL (ref 8.9–10.3)
Chloride: 95 mmol/L — ABNORMAL LOW (ref 98–111)
Creatinine, Ser: 0.79 mg/dL (ref 0.44–1.00)
GFR, Estimated: >60 mL/min (ref >60)
Glucose, Bld: 132 mg/dL — ABNORMAL HIGH (ref 70–99)
Potassium: 3 mmol/L — ABNORMAL LOW (ref 3.5–5.1)
Sodium: 136 mmol/L (ref 135–145)

## 2023-06-01 LAB — BRAIN NATRIURETIC PEPTIDE: B Natriuretic Peptide: 37.4 pg/mL (ref 0.0–100.0)

## 2023-06-01 MED ORDER — POTASSIUM CHLORIDE CRYS ER 20 MEQ PO TBCR: 40.0000 meq | EXTENDED_RELEASE_TABLET | Freq: Once | ORAL | 0 refills | Status: DC

## 2023-06-01 MED ORDER — METOLAZONE 2.5 MG PO TABS: 2.5000 mg | ORAL_TABLET | ORAL | 0 refills | Status: DC

## 2023-06-01 NOTE — Patient Instructions (Signed)
 Medication Changes:  TAKE 1 DOSE OF METOLAZONE 2.5MG  TOMORROW WITH YOUR DOSE OF BUMEX   TAKE 40 OF POTASSIUM WITH THIS DOSE AS WELL   Lab Work:  Labs done today, your results will be available in MyChart, we will contact you for abnormal readings  Follow-Up in: AS SCHEDULED WITH DR. Elwyn Lade   At the Advanced [facility] Failure Clinic, you and your health needs are our priority. We have a designated team specialized in the treatment of Heart Failure. This Care Team includes your primary Heart Failure Specialized Cardiologist (physician), Advanced Practice Providers (APPs- Physician Assistants and Nurse Practitioners), and Pharmacist who all work together to provide you with the care you need, when you need it.   You may see any of the following providers on your designated Care Team at your next follow up:  Dr. Arvilla Meres Dr. Marca Ancona Dr. Dorthula Nettles Dr. Theresia Bough Tonye Becket, NP Robbie Lis, [place] [facility] [place], [place] Brynda Peon, NP [place] Lee, NP Karle Plumber, PharmD   Please be sure to bring in all your medications bottles to every appointment.   Need to Contact us:  If you have any questions or concerns before your next appointment please send [place] a message through [employer] or call our office at [phone].    TO LEAVE A MESSAGE FOR THE NURSE SELECT OPTION 2, PLEASE LEAVE A MESSAGE INCLUDING: YOUR NAME DATE OF BIRTH CALL BACK NUMBER REASON FOR CALL**this is important as we prioritize the call backs  YOU WILL RECEIVE A CALL BACK THE SAME DAY AS LONG AS YOU CALL BEFORE 4:00 PM

## 2023-06-01 NOTE — Patient Instructions (Addendum)
 Medication Instructions:  Your physician recommends that you continue on your current medications as directed. Please refer to the Current Medication list given to you today.   Follow-Up: At [facility], you and your health needs are our priority.  As part of our continuing mission to provide you with exceptional heart care, we have created designated Provider Care Teams.  These Care Teams include your primary Cardiologist (physician) and Advanced Practice Providers (APPs -  Physician Assistants and Nurse Practitioners) who all work together to provide you with the care you need, when you need it.  We recommend signing up for the patient portal called "MyChart".  Sign up information is provided on this After Visit Summary.  MyChart is used to connect with patients for Virtual Visits (Telemedicine).  Patients are able to view lab/test results, encounter notes, upcoming appointments, etc.  Non-urgent messages can be sent to your provider as well.   To learn more about what you can do with MyChart, go to [email or web address].    Your next appointment:   No follow up needed at this time!   Other Instructions [facility] phone number [phone].  We will have our prior authorization team submit prior authorization for [facility].

## 2023-06-01 NOTE — Telephone Encounter (Signed)
 Pharmacy Patient Advocate Encounter   Received notification from Physician's Office that prior authorization for [facility] is required/requested.   Insurance verification completed.   The patient is insured through [facility] .   Per test claim: PA required; PA submitted to above mentioned insurance via CoverMyMeds Key/confirmation #/EOC [id number] Status is pending

## 2023-06-01 NOTE — Progress Notes (Signed)
 PA request has been Submitted. New Encounter has been or will be created for follow up. For additional info see Pharmacy Prior Auth telephone encounter from 06/01/23.

## 2023-06-01 NOTE — Progress Notes (Signed)
 Cardiology Office Note:  .   Date:  06/01/2023  ID:  Alyssa Blanchard, DOB 05-Jul-1970, MRN [id number] PCP: Randel Pigg, Dorma Russell, MD  [facility] Providers Cardiologist:  Chilton Si, MD    History of Present Illness: Marland Kitchen   Alyssa Blanchard is a 53 y.o. female with hx of obesity s/p gastric bypass, CHF, OSA/obesity hypoventilation with pulmonary hypertension, atrial fibrillation on Eliquis, DM 2  Admitted 03/2023 at [facility] with CHF exacerbation and diuresed.  2/13-2/20 Admission to [facility] IV diuresis 11.8 L and DCCV on 04/28/23. Her Propranolol was switched to Metprolol. She was discharged to CIR.   ED visit 05/24/23 and 05/25/23 with chest pain felt to be related to pinched nerve. Has MRI upcoming for further evaluation. Pain was worse with a deep breath and associated with exertional dyspnea.  Troponin x2, CXR, BNP unremarkable. Pain was reproducible with palpation, twisting.   Seen by [facility]plant Team 05/29/23. Approximate dry weight 384 to 386 pounds. 3 day monitor ordered, not yet started. Of note only 3 days due to adhesive intolerance. She has seen Dr. Craige Cotta of [facility] 05/27/23 and home sleep study ordered but not yet performed.  Presents today for follow up. Weight up 5 lbs over last week. She took 3mg  dose of Bumex once after her 05/29/23 visit but not since that time.  Endorses following a low-sodium diet.  Notes persistent exertional dyspnea. No recurrent chest pain. Her LE edema is bilateral and nonpitting.   She is participating in home health PT though interested in more intensive regimen. Has not yet taken her medications today due to office visit, takes them in the AM and at 5PM.   Prior diabetes medications Semaglutide (stomach side effects) Trulicity (ineffective Glipizide (caused her to pass out) Victoza (stomach side effects ) Jardiance (yeast infection)  ROS: Please see the history of present illness.    All other systems reviewed  and are negative.   Studies Reviewed: .        Cardiac Studies & Procedures   ______________________________________________________________________________________________     ECHOCARDIOGRAM  ECHOCARDIOGRAM COMPLETE 04/24/2023  Narrative ECHOCARDIOGRAM REPORT    Patient Name:   Alyssa Blanchard Date of Exam: 04/24/2023 Medical Rec #:  [id number]         Height:       65.0 in Accession #:    [id number]        Weight:       442.9 lb Date of Birth:  07/05/1970         BSA:          2.773 m Patient Age:    52 years          BP:           116/58 mmHg Patient Gender: F                 HR:           99 bpm. Exam Location:  Inpatient  Procedure: 2D Echo (Both Spectral and Color Flow Doppler were utilized during procedure).  Indications:    congestive heart failure  History:        Patient has prior history of Echocardiogram examinations, most recent 08/05/2019. Arrythmias:Atrial Fibrillation; Risk Factors:Diabetes.  Sonographer:    Delcie Roch RDCS Referring Phys: [id number] Lynda Rainwater A ACHARYA  IMPRESSIONS   1. Left ventricular ejection fraction, by estimation, is 70 to 75%. The left ventricle has hyperdynamic function. The left ventricle has no  regional wall motion abnormalities. There is mild left ventricular hypertrophy. Left ventricular diastolic parameters were normal (medial e' 9.7 cm/s). 2. Right ventricular systolic function is normal. The right ventricular size is normal. There is mildly elevated pulmonary artery systolic pressure. The estimated right ventricular systolic pressure is 38.5 mmHg. 3. The mitral valve is grossly normal. Trivial mitral valve regurgitation. No evidence of mitral stenosis. 4. The aortic valve was not well visualized. There is mild calcification of the aortic valve. Aortic valve regurgitation is not visualized. No aortic stenosis is present. 5. The inferior vena cava is dilated in size with >50% respiratory variability, suggesting right atrial  pressure of 8 mmHg. 6. Increased flow velocities may be secondary to anemia, thyrotoxicosis, hyperdynamic or high flow state.  FINDINGS Left Ventricle: Left ventricular ejection fraction, by estimation, is 70 to 75%. The left ventricle has hyperdynamic function. The left ventricle has no regional wall motion abnormalities. The left ventricular internal cavity size was normal in size. There is mild left ventricular hypertrophy. Left ventricular diastolic parameters were normal.  Right Ventricle: The right ventricular size is normal. No increase in right ventricular wall thickness. Right ventricular systolic function is normal. There is mildly elevated pulmonary artery systolic pressure. The tricuspid regurgitant velocity is 2.76 m/s, and with an assumed right atrial pressure of 8 mmHg, the estimated right ventricular systolic pressure is 38.5 mmHg.  Left [facility]: Left atrial size was normal in size.  Right [facility]: Right atrial size was normal in size.  Pericardium: There is no evidence of pericardial effusion.  Mitral Valve: The mitral valve is grossly normal. Trivial mitral valve regurgitation. No evidence of mitral valve stenosis.  Tricuspid Valve: The tricuspid valve is normal in structure. Tricuspid valve regurgitation is trivial. No evidence of tricuspid stenosis.  Aortic Valve: The aortic valve was not well visualized. There is mild calcification of the aortic valve. Aortic valve regurgitation is not visualized. No aortic stenosis is present. Aortic valve mean gradient measures 15.0 mmHg. Aortic valve peak gradient measures 27.4 mmHg. Aortic valve area, by VTI measures 2.19 cm.  Pulmonic Valve: The pulmonic valve was not well visualized. Pulmonic valve regurgitation is trivial. No evidence of pulmonic stenosis.  Aorta: The aortic root is normal in size and structure.  Venous: The inferior vena cava is dilated in size with greater than 50% respiratory variability, suggesting right  atrial pressure of 8 mmHg.  IAS/Shunts: The interatrial septum was not well visualized.   LEFT VENTRICLE PLAX 2D LVIDd:         4.90 cm   Diastology LVIDs:         3.40 cm   LV e' medial:    9.68 cm/s LV PW:         1.20 cm   LV E/e' medial:  14.4 LV IVS:        1.10 cm   LV e' lateral:   15.00 cm/s LVOT diam:     1.80 cm   LV E/e' lateral: 9.3 LV SV:         91 LV SV Index:   33 LVOT Area:     2.54 cm   RIGHT VENTRICLE             IVC RV Basal diam:  2.80 cm     IVC diam: 2.20 cm RV S prime:     17.50 cm/s TAPSE (M-mode): 3.0 cm  LEFT ATRIUM             Index  RIGHT ATRIUM           Index LA diam:        4.20 cm 1.51 cm/m   RA Area:     15.50 cm LA Vol (A2C):   42.7 ml 15.40 ml/m  RA Volume:   37.40 ml  13.49 ml/m LA Vol (A4C):   70.8 ml 25.53 ml/m LA Biplane Vol: 56.2 ml 20.26 ml/m AORTIC VALVE AV Area (Vmax):    2.05 cm AV Area (Vmean):   2.12 cm AV Area (VTI):     2.19 cm AV Vmax:           261.50 cm/s AV Vmean:          181.000 cm/s AV VTI:            0.417 m AV Peak Grad:      27.4 mmHg AV Mean Grad:      15.0 mmHg LVOT Vmax:         211.00 cm/s LVOT Vmean:        150.500 cm/s LVOT VTI:          0.359 m LVOT/AV VTI ratio: 0.86  AORTA Ao Root diam: 3.00 cm Ao Asc diam:  3.70 cm  MITRAL VALVE                TRICUSPID VALVE MV Area (PHT): 4.60 cm     TR Peak grad:   30.5 mmHg MV Decel Time: 165 msec     TR Vmax:        276.00 cm/s MV E velocity: 139.00 cm/s MV A velocity: 117.00 cm/s  SHUNTS MV E/A ratio:  1.19         Systemic VTI:  0.36 m Systemic Diam: 1.80 cm  Weston Brass MD Electronically signed by Weston Brass MD Signature Date/Time: 04/24/2023/6:20:08 PM    Final   TEE  ECHO TEE 08/05/2019  Narrative TRANSESOPHOGEAL ECHO REPORT    Patient Name:   Alyssa Blanchard Date of Exam: 08/05/2019 Medical Rec #:  [id number]   Height:       65.0 in Accession #:    [id number]  Weight:       388.0 lb Date of Birth:  02/05/1971   BSA:           2.622 m Patient Age:    48 years    BP:           140/104 mmHg Patient Gender: F           HR:           92 bpm. Exam Location:  Inpatient  Procedure: Transesophageal Echo, Color Doppler and Cardiac Doppler  Indications:     atrial fibrillation  History:         Patient has prior history of Echocardiogram examinations, most recent 08/03/2019.  Sonographer:     Delcie Roch Referring Phys:  [id number] TIFFANY  Diagnosing Phys: Kristeen Miss MD  PROCEDURE: After discussion of the risks and benefits of a TEE, an informed consent was obtained from the patient. The patient was intubated. The transesophogeal probe was passed without difficulty through the esophogus of the patient. Sedation performed by different physician. The patient developed no complications during the procedure. A direct current cardioversion was performed.  IMPRESSIONS   1. Left ventricular ejection fraction, by estimation, is 30 to 35%. The left ventricle has moderately decreased function. The left ventricle demonstrates global hypokinesis. 2. Right ventricular systolic function is normal. The right ventricular  size is normal. 3. No left atrial/left atrial appendage thrombus was detected. 4. The mitral valve is grossly normal. Mild mitral valve regurgitation. No evidence of mitral stenosis. 5. The aortic valve is grossly normal. Aortic valve regurgitation is trivial. No aortic stenosis is present.  FINDINGS Left Ventricle: Left ventricular ejection fraction, by estimation, is 30 to 35%. The left ventricle has moderately decreased function. The left ventricle demonstrates global hypokinesis. The left ventricular internal cavity size was normal in size. There is no left ventricular hypertrophy.  Right Ventricle: The right ventricular size is normal. No increase in right ventricular wall thickness. Right ventricular systolic function is normal.  Left [facility]: Left atrial size was normal in size. No  left atrial/left atrial appendage thrombus was detected.  Right [facility]: Right atrial size was normal in size.  Pericardium: There is no evidence of pericardial effusion.  Mitral Valve: The mitral valve is grossly normal. Mild mitral valve regurgitation. No evidence of mitral valve stenosis.  Tricuspid Valve: The tricuspid valve is normal in structure. Tricuspid valve regurgitation is mild . No evidence of tricuspid stenosis.  Aortic Valve: The aortic valve is grossly normal. Aortic valve regurgitation is trivial. No aortic stenosis is present.  Pulmonic Valve: The pulmonic valve was normal in structure. Pulmonic valve regurgitation is not visualized. No evidence of pulmonic stenosis.  Aorta: The aortic root and ascending aorta are structurally normal, with no evidence of dilitation.  IAS/Shunts: The atrial septum is grossly normal.  Kristeen Miss MD Electronically signed by Kristeen Miss MD Signature Date/Time: 08/05/2019/3:19:47 PM    Final        ______________________________________________________________________________________________      Risk Assessment/Calculations:    CHA2DS2-VASc Score = 4   This indicates a 4.8% annual risk of stroke. The patient's score is based upon: CHF History: 1 HTN History: 1 Diabetes History: 1 Stroke History: 0 Vascular Disease History: 0 Age Score: 0 Gender Score: 1            Physical Exam:   VS:  BP 118/70 (BP Location: Right Arm, Patient Position: Sitting, Cuff Size: Large)   Pulse 70   Ht 5\' 5"  (1.651 m)   Wt (!) 392 lb 9.6 oz (178.1 kg)   LMP 12/20/2017   SpO2 93%   BMI 65.33 kg/m    Wt Readings from Last 3 Encounters:  06/01/23 (!) 390 lb (176.9 kg)  06/01/23 (!) 392 lb 9.6 oz (178.1 kg)  05/24/23 (!) 384 lb 11.2 oz (174.5 kg)    GEN: Well nourished, overweight, well developed in no acute distress NECK: No JVD; No carotid bruits CARDIAC: RRR, no murmurs, rubs, gallops RESPIRATORY:  Clear to auscultation  without rales, wheezing or rhonchi  ABDOMEN: Soft, non-tender, non-distended EXTREMITIES:  No edema; No deformity   ASSESSMENT AND PLAN: .    PAF - s/p cardioversion 04/28/23. Denies palpitations. CHA2DS2-VASc Score = 4 [CHF History: 1, HTN History: 1, Diabetes History: 1, Stroke History: 0, Vascular Disease History: 0, Age Score: 0, Gender Score: 1].  Therefore, the patient's annual risk of stroke is 4.8 %.    Continue Eliquis 5mg  BID. Denies bleeding complications.   Obesity / DM2 - Weight loss via diet and exercise encouraged. Discussed the impact being overweight would have on cardiovascular risk.  Multiple intolerances to prior diabetes medications (semaglutide, Trulicity, glipizide, Victoza, Jardiance).  Will submit prior authorization for [facility]. Refer to Physical Therapy for aqua therapy  OSA - Not presently using CPAP. Seen 05/27/23 by Dr. Craige Cotta of [facility], home sleep study ordered but not yet performed.  Chronic diastolic congestive heart failure - status difficult to ascertain due to body habitus.  Weight up 5 pounds over the last week.  Has office visit to establish with [facility] this afternoon. Anticipate will be addressed, but educated to take 3mg  Bumex BID tomorrow and then return to 2mg  BID dose unless other directions provided.  Additional GDMT includes Toprol 75 mg daily, spironolactone 25 mg daily.  Relative hypotension limits ARNI.  No Jardiance due to frequent yeast infection/UTI. She is considering following with Heart Failure at [facility] vs [facility] and OV this afternoon with [employer] Heart Failure team. [facility] may be more convenient for appointments. Discussed Heart Failure team likely best management plan given recurrent admissions/ED visits and severity of symptoms.         Dispo: follow up as scheduled with Advanced Heart Failure Team  Signed, Alver Sorrow, NP

## 2023-06-02 ENCOUNTER — Encounter (HOSPITAL_BASED_OUTPATIENT_CLINIC_OR_DEPARTMENT_OTHER): Payer: Self-pay

## 2023-06-02 ENCOUNTER — Other Ambulatory Visit (HOSPITAL_COMMUNITY): Payer: Self-pay

## 2023-06-02 ENCOUNTER — Telehealth (HOSPITAL_COMMUNITY): Payer: Self-pay | Admitting: *Deleted

## 2023-06-02 DIAGNOSIS — I5032 Chronic diastolic (congestive) heart failure: Secondary | ICD-10-CM

## 2023-06-02 MED ORDER — POTASSIUM CHLORIDE CRYS ER 20 MEQ PO TBCR: 40.0000 meq | EXTENDED_RELEASE_TABLET | Freq: Every day | ORAL | 3 refills | Status: DC

## 2023-06-02 NOTE — Telephone Encounter (Signed)
 Per insurance, claim was denied due to not having documentation of trial and failure of Trulicity and Victoza. Appeal will require treatment dates for both drugs. We do not have documentation of dates that patient tried and failed Victoza. If we submit with the info we currently have, appeal will be denied. Provider is aware. Patient is to reach out to PCP to check and see if they have an appeal status before we attempt an appeal.

## 2023-06-02 NOTE — Telephone Encounter (Signed)
 Called patient per Prince Rome, NP with following:  "K is low. Start 40 KCL daily. Repeat BMET in 1 week. EKG showed sinus rhythm."  Pt verbalized understanding of same. Rx sent to requested pharmacy. Repeat lab ordered and scheduled.

## 2023-06-02 NOTE — Telephone Encounter (Signed)
 PA submission closed out. Denial already on file. Can't find chart notes or media on the previous PA. Called insurance to request a copy of original denial to follow appeals process.

## 2023-06-02 NOTE — Telephone Encounter (Signed)
error 

## 2023-06-03 ENCOUNTER — Telehealth (HOSPITAL_COMMUNITY): Payer: Self-pay | Admitting: Cardiology

## 2023-06-03 ENCOUNTER — Encounter (HOSPITAL_COMMUNITY): Payer: Self-pay | Admitting: Cardiology

## 2023-06-03 NOTE — Telephone Encounter (Signed)
 Patient called to report extra dose of dieretic did not help at all  Metolazone taken 3/25 in addition to bumex Reports no increase in UOP Report no cough Weight today 390 RLE edema-lymphedema present Drinking 2L daily (unable to clarify +/-)   Please advise if changes are needed at this time.

## 2023-06-03 NOTE — Telephone Encounter (Signed)
 Pt aware and voiced understanding  Pt aware to answer all 800 phone numbers as [employer] will need to verify demographic information.  Pt aware first dose usually arrives overnight and remaining doses should be mailed shortly after  Aware to hold bumex while using furoscix  Instructions sent via Arletha Pili RN to assist with furoscix direct order

## 2023-06-08 ENCOUNTER — Telehealth (HOSPITAL_COMMUNITY): Payer: Self-pay | Admitting: Pharmacist

## 2023-06-08 NOTE — Telephone Encounter (Signed)
 Patient Advocate Encounter   Received notification from [facility] Medicaid that prior authorization for Furoscix is required.   PA submitted on CoverMyMeds Key [id number] Status is pending   Will continue to follow.   Karle Plumber, PharmD, BCPS, BCCP, CPP Heart Failure Clinic Pharmacist [phone]

## 2023-06-09 NOTE — Telephone Encounter (Signed)
 Advanced Heart Failure Patient Advocate Encounter  Prior Authorization for Furoscix has been approved.    PA# [id number] Effective dates: 06/08/23 through 05/20/24  Karle Plumber, PharmD, BCPS, BCCP, CPP Heart Failure Clinic Pharmacist [phone]

## 2023-06-12 ENCOUNTER — Other Ambulatory Visit (HOSPITAL_COMMUNITY)

## 2023-06-16 ENCOUNTER — Encounter (HOSPITAL_COMMUNITY): Admitting: Cardiology

## 2023-06-16 ENCOUNTER — Encounter (HOSPITAL_COMMUNITY): Payer: Self-pay | Admitting: Cardiology

## 2023-06-16 ENCOUNTER — Ambulatory Visit (HOSPITAL_COMMUNITY)
Admission: RE | Admit: 2023-06-16 | Discharge: 2023-06-16 | Disposition: A | Discharge status: Home / Self Care | Source: Ambulatory Visit | Attending: Cardiology | Admitting: Cardiology

## 2023-06-16 DIAGNOSIS — J45909 Unspecified asthma, uncomplicated: Secondary | ICD-10-CM | POA: Insufficient documentation

## 2023-06-16 DIAGNOSIS — I480 Paroxysmal atrial fibrillation: Secondary | ICD-10-CM | POA: Diagnosis not present

## 2023-06-16 DIAGNOSIS — G4733 Obstructive sleep apnea (adult) (pediatric): Secondary | ICD-10-CM | POA: Insufficient documentation

## 2023-06-16 DIAGNOSIS — E119 Type 2 diabetes mellitus without complications: Secondary | ICD-10-CM | POA: Diagnosis not present

## 2023-06-16 DIAGNOSIS — I110 Hypertensive heart disease with heart failure: Secondary | ICD-10-CM | POA: Insufficient documentation

## 2023-06-16 DIAGNOSIS — Z79899 Other long term (current) drug therapy: Secondary | ICD-10-CM | POA: Insufficient documentation

## 2023-06-16 DIAGNOSIS — Z9884 Bariatric surgery status: Secondary | ICD-10-CM | POA: Diagnosis not present

## 2023-06-16 DIAGNOSIS — I429 Cardiomyopathy, unspecified: Secondary | ICD-10-CM | POA: Insufficient documentation

## 2023-06-16 DIAGNOSIS — I5032 Chronic diastolic (congestive) heart failure: Secondary | ICD-10-CM | POA: Diagnosis present

## 2023-06-16 DIAGNOSIS — Z7901 Long term (current) use of anticoagulants: Secondary | ICD-10-CM | POA: Diagnosis not present

## 2023-06-16 MED ORDER — SPIRONOLACTONE 25 MG PO TABS: 50.0000 mg | ORAL_TABLET | Freq: Every day | ORAL | 3 refills | Status: DC

## 2023-06-16 MED ORDER — METOLAZONE 5 MG PO TABS: 5.0000 mg | ORAL_TABLET | ORAL | 3 refills | Status: DC

## 2023-06-16 MED ORDER — POTASSIUM CHLORIDE CRYS ER 20 MEQ PO TBCR: 40.0000 meq | EXTENDED_RELEASE_TABLET | Freq: Every day | ORAL | 3 refills | Status: DC

## 2023-06-16 MED ORDER — METOPROLOL SUCCINATE ER 25 MG PO TB24: 50.0000 mg | ORAL_TABLET | Freq: Every day | ORAL | 3 refills | Status: DC

## 2023-06-16 NOTE — Patient Instructions (Signed)
 CHANGE Metolazone to 5 mg three times a week, take an extra on these days  INCREASE Spironolactone to 50 mg daily.  DECREASE Metoprolol to 50 mg daily.  Your physician recommends that you schedule a follow-up appointment in: 3 weeks.  If you have any questions or concerns before your next appointment please send [place] a message through [place] or call our office at [phone].    TO LEAVE A MESSAGE FOR THE NURSE SELECT OPTION 2, PLEASE LEAVE A MESSAGE INCLUDING: YOUR NAME DATE OF BIRTH CALL BACK NUMBER REASON FOR CALL**this is important as we prioritize the call backs  YOU WILL RECEIVE A CALL BACK THE SAME DAY AS LONG AS YOU CALL BEFORE 4:00 PM  At the Advanced [facility] Failure Clinic, you and your health needs are our priority. As part of our continuing mission to provide you with exceptional heart care, we have created designated Provider Care Teams. These Care Teams include your primary Cardiologist (physician) and Advanced Practice Providers (APPs- Physician Assistants and Nurse Practitioners) who all work together to provide you with the care you need, when you need it.   You may see any of the following providers on your designated Care Team at your next follow up: Dr Arvilla Meres Dr Marca Ancona Dr. Dorthula Nettles Dr. Clearnce Hasten Amy Filbert Schilder, NP Robbie Lis, [place] [employer] [place], [place] Brynda Peon, NP [place] Lee, NP Clarisa Kindred, NP Karle Plumber, PharmD Enos Fling, PharmD   Please be sure to bring in all your medications bottles to every appointment.    Thank you for choosing [facility]

## 2023-06-16 NOTE — Progress Notes (Signed)
 ADVANCED HEART FAILURE NEW PATIENT CLINIC NOTE  Referring Physician: Randel Pigg, Dorma Russell, MD  Primary Care: Randel Pigg, Dorma Russell, MD Primary Cardiologist:  HPI: Alyssa Blanchard is a 53 y.o. female with a PMH of HFpEF, untreated OSA/OHS, asthma, PAF, morbid obesity w/ prior laparoscopic sleeve gastrectomy in 2016, DM II, HTN  who presents for initial visit for further evaluation and treatment of heart failure/cardiomyopathy.      Admitted to [facility] in 1/25 with acute on chronic HFpEF and RLE cellulitis.   She was readmitted to [facility] in 2/25 with volume overload, Afib with RVR. Echo with EF 70-75%, RV okay, RVSP 39 mmHg. Diuresed with IV lasix. Later underwent TEE/DCCV to SR. She was also treated for norovirus, RLE cellulitis, E. Coli UTI then candida vaginosis. Discharged to CIR, weight 392 lbs.   She saw [facility] A[facility]/transplant team for consult 05/29/23.      SUBJECTIVE:  Patient overall reports that she is doing fair.  Her weight had been down to 377 pounds but she had pizza recently at [facility] had bumped it back up to 389 today.  She reports that she had minimal urinary output after taking the metolazone 2.5 mg once.  She pees occasionally with the 2 mg Bumex dose but really opens up when she takes 3.  She has been hospitalized multiple times in the past few months for volume overload and has been trying to lose weight and watch her fluid intake.  She lives with her mother at home.   PMH, current medications, allergies, social history, and family history reviewed in epic.  PHYSICAL EXAM: Vitals:   06/16/23 1519  BP: 108/70  Pulse: 89  SpO2: 97%   GENERAL: Super morbid obesity PULM: Mildly increased work of breathing CARDIAC:  JVP: Moderately elevated         Normal rate with regular rhythm. No murmurs, rubs or gallops.  1+ edema of the lower extremities  ABDOMEN: Soft, non-tender, non-distended. NEUROLOGIC: Patient is oriented x3 with no focal or lateralizing neurologic  deficits.    DATA REVIEW  ECG: 06/01/2023: Normal sinus rhythm  ECHO: 04/24/2023: LVEF 70 to 75%, normal E prime, normal RV systolic function, mildly elevated RVSP, no gross valvular abnormalities  CATH: R/LHC at [facility] 9/23: RA mean 24, PA mean 49, PCWP mean 34, PVR 1.3 WU, Fick CI 2.01, TD CI 4.07, LVEDP 32, no CAD       ASSESSMENT & PLAN:  Chronic diastolic heart failure: Multiple readmissions, volume status obviously difficult given body habitus.  Does appear to still be volume overloaded.  Suspect largely due to morbid obesity and untreated sleep apnea. - start 3 times weekly metolazone 5 mg with additional potassium on those days.   -Continue her 2 mg of Bumex twice daily - Spironolactone to 50 mg daily - Decrease metoprolol to 50 mg daily - App clinic follow-up in 3 weeks with lab monitoring - Not a [facility] HFMS candidate given insurance - Consider cardiomems if demonstrating compliance and having recurrent admissions. Would likely have to consider inpatient placement with insurance  Morbid obesity: Previous gastric sleeve. - GLP-1 consideration  PAF: Previously on tikosyn, s/p DCCV 2/25.  - NSR today - Continue eliquis 5mg  BID, recent CBC reviewed  HTN: BP controlled today in clinic. - Consideration of ARB given unopposed high dose loop diuretics if creatinine stable  OSA:  - Sleep study hopefully soon  Follow up in 3 weeks  Clearnce Hasten, MD Advanced Heart Failure Mechanical Circulatory Support 06/16/23

## 2023-07-08 ENCOUNTER — Encounter (HOSPITAL_COMMUNITY)

## 2023-07-13 ENCOUNTER — Ambulatory Visit (INDEPENDENT_AMBULATORY_CARE_PROVIDER_SITE_OTHER): Payer: Medicaid Other | Admitting: Podiatry

## 2023-07-13 DIAGNOSIS — Z91199 Patient's noncompliance with other medical treatment and regimen due to unspecified reason: Secondary | ICD-10-CM

## 2023-07-13 NOTE — Progress Notes (Signed)
 No show

## 2023-07-14 ENCOUNTER — Ambulatory Visit (HOSPITAL_BASED_OUTPATIENT_CLINIC_OR_DEPARTMENT_OTHER): Admitting: Family Medicine

## 2023-07-16 ENCOUNTER — Encounter (HOSPITAL_BASED_OUTPATIENT_CLINIC_OR_DEPARTMENT_OTHER): Payer: Self-pay | Admitting: Physical Therapy

## 2023-07-16 ENCOUNTER — Ambulatory Visit (HOSPITAL_BASED_OUTPATIENT_CLINIC_OR_DEPARTMENT_OTHER): Attending: Family | Admitting: Physical Therapy

## 2023-07-16 DIAGNOSIS — M6281 Muscle weakness (generalized): Secondary | ICD-10-CM | POA: Diagnosis present

## 2023-07-16 DIAGNOSIS — M25561 Pain in right knee: Secondary | ICD-10-CM | POA: Insufficient documentation

## 2023-07-16 DIAGNOSIS — R2689 Other abnormalities of gait and mobility: Secondary | ICD-10-CM | POA: Diagnosis present

## 2023-07-16 DIAGNOSIS — R5381 Other malaise: Secondary | ICD-10-CM | POA: Insufficient documentation

## 2023-07-16 DIAGNOSIS — M25562 Pain in left knee: Secondary | ICD-10-CM | POA: Diagnosis present

## 2023-07-16 DIAGNOSIS — G8929 Other chronic pain: Secondary | ICD-10-CM | POA: Diagnosis present

## 2023-07-16 NOTE — Therapy (Signed)
 OUTPATIENT PHYSICAL THERAPY THORACOLUMBAR EVALUATION   Patient Name: Alyssa Blanchard MRN: [id number] DOB:Jun 03, 1970, 53 y.o., female Today's Date: 07/16/2023  END OF SESSION:  PT End of Session - 07/16/23 1421     Visit Number 1    Number of Visits --    Date for PT Re-Evaluation 09/10/23    Authorization Type medicaid    PT Start Time 1315    PT Stop Time 1350    PT Time Calculation (min) 35 min    Activity Tolerance Patient tolerated treatment well    Behavior During Therapy WFL for tasks assessed/performed             Past Medical History:  Diagnosis Date   Acute on chronic congestive heart failure (HCC) 05/02/2023   Anxiety    Asthma    Atrial fibrillation (HCC)    Depression    DM (diabetes mellitus), type 2 (HCC) 02/16/2022   Dysrhythmia    new onset Afib rvr   GERD (gastroesophageal reflux disease)    Heart failure (HCC)    Heel spur    bilat   History of bronchitis    History of chicken pox    History of urinary tract infection    Hypertension    Migraines    Plantar fasciitis    bilat   STD (sexually transmitted disease)    chl hx & hsv 1&2   Tremors of nervous system    Urinary incontinence    Past Surgical History:  Procedure Laterality Date   BIOPSY  10/14/2022   Procedure: BIOPSY;  Surgeon: Elois Hair, MD;  Location: [facility];  Service: Gastroenterology;;   CARDIOVERSION N/A 08/05/2019   Procedure: CARDIOVERSION;  Surgeon: Lake Pilgrim, MD;  Location: [facility];  Service: Cardiovascular;  Laterality: N/A;   CARDIOVERSION N/A 04/28/2023   Procedure: CARDIOVERSION;  Surgeon: Hugh Madura, MD;  Location: [facility] INVASIVE CV LAB;  Service: Cardiovascular;  Laterality: N/A;   COLONOSCOPY WITH PROPOFOL  N/A 10/14/2022   Procedure: COLONOSCOPY WITH PROPOFOL ;  Surgeon: Elois Hair, MD;  Location: [facility] ENDOSCOPY;  Service: Gastroenterology;  Laterality: N/A;   ESOPHAGOGASTRODUODENOSCOPY (EGD) WITH PROPOFOL  N/A 10/14/2022   Procedure:  ESOPHAGOGASTRODUODENOSCOPY (EGD) WITH PROPOFOL ;  Surgeon: Elois Hair, MD;  Location: [facility] ENDOSCOPY;  Service: Gastroenterology;  Laterality: N/A;   LAPAROSCOPIC GASTRIC SLEEVE RESECTION N/A 11/27/2014   Procedure: LAPAROSCOPIC GASTRIC SLEEVE RESECTION WITH HIATAL HERNIA REPAIR UPPER ENDOSCOPY;  Surgeon: Jacolyn Matar, MD;  Location: [facility] ORS;  Service: General;  Laterality: N/A;   POLYPECTOMY  10/14/2022   Procedure: POLYPECTOMY;  Surgeon: Elois Hair, MD;  Location: [facility] ENDOSCOPY;  Service: Gastroenterology;;   TEE WITHOUT CARDIOVERSION N/A 08/05/2019   Procedure: TRANSESOPHAGEAL ECHOCARDIOGRAM (TEE);  Surgeon: Lake Pilgrim, MD;  Location: [facility] ENDOSCOPY;  Service: Cardiovascular;  Laterality: N/A;   TONSILLECTOMY  1978   Patient Active Problem List   Diagnosis Date Noted   Type 2 diabetes mellitus with hyperglycemia, without long-term current use of insulin  (HCC) 05/02/2023   Acute on chronic systolic CHF (congestive heart failure) (HCC) 05/02/2023   Acute on chronic congestive heart failure (HCC) 05/02/2023   Obesity, class 3 04/29/2023   Gastroenteritis due to norovirus 04/27/2023   UTI (urinary tract infection) 04/26/2023   Hypomagnesemia 04/26/2023   Hypokalemia 04/26/2023   Atrial fibrillation with rapid ventricular response (HCC) 04/26/2023   Fever 04/25/2023   Diarrhea 04/25/2023   Asthma 04/24/2023   Depression with anxiety 04/24/2023   Hypertension 04/24/2023   Acute  on chronic diastolic heart failure (HCC) 04/23/2023   Rectal bleeding 10/14/2022   Anxiety 06/30/2022   Debility 02/28/2022   Prolonged QT interval 02/17/2022   Cellulitis of right leg 02/16/2022   Lumbar radiculopathy, right 02/16/2022   Anemia 02/16/2022   Iron  deficiency anemia 02/02/2022   Type 2 diabetes mellitus without complication, without long-term current use of insulin  (HCC) 12/25/2021   Chronic combined systolic and diastolic congestive heart failure (HCC) 09/25/2019   Acute  pulmonary edema (HCC)    Paroxysmal atrial fibrillation (HCC) 08/02/2019   Bipolar disorder, in full remission, most recent episode manic (HCC) 04/15/2019   Psychosis (HCC) 04/14/2019   Not well controlled moderate persistent asthma 02/02/2018   Chronic rhinitis 02/02/2018   Status asthmaticus 11/29/2017   Acute respiratory failure with hypoxia (HCC) 11/29/2017   Gastroesophageal reflux disease 09/01/2017   Cough 12/31/2016   Dysuria 07/28/2016   Clinical infection 07/28/2016   History of asthma 04/17/2016   Acute bronchitis 04/17/2016   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   Migraine without aura and without status migrainosus, not intractable 10/10/2014   Migraine with aura and without status migrainosus, not intractable 08/29/2014   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   Snorings 07/10/2014   Sleep related headaches 07/10/2014   Nocturia more than twice per night 07/10/2014   Preventative health care 07/30/2012   Morbid obesity (HCC) 07/30/2012    PCP: Michele Ahle MD  REFERRING PROVIDER: Clearnce Curia, NP   REFERRING DIAG: R53.81 (ICD-10-CM) - Physical deconditioning   Rationale for Evaluation and Treatment: Rehabilitation  THERAPY DIAG:  Muscle weakness (generalized)  Other abnormalities of gait and mobility  Chronic pain of both knees  ONSET DATE: few years  SUBJECTIVE:                                                                                                                                                                                           SUBJECTIVE STATEMENT: I have CHF.  Lymphedema in legs R>L.  Get SOB easily.  Walked from parking deck to [employer] (at least 100 ft) and needed to sit.  I have a lot going on.  Walk around my home without AD.  Need a cane when walking  long but have been trying to go without. I won't make it all the way back here without my cane or a walker  PERTINENT HISTORY:   Chronic diastolic congestive heart failure   hx of obesity s/p gastric bypass, CHF, OSA/obesity hypoventilation with pulmonary hypertension, atrial fibrillation on Eliquis , DM 2   Multiple admissions to ER/hospital in feb and Mar 2025 chest  pain/may be pinched nerve "Pain was reproducible with palpation, twisting "  Seeing [facility].  PAIN:  Are you having pain? Yes: NPRS scale: 5.5 Pain location: knee Pain description: ache Aggravating factors: walking Relieving factors: sititng decreased fluid retention  PRECAUTIONS: ICD/Pacemaker  RED FLAGS: None   WEIGHT BEARING RESTRICTIONS: No  FALLS:  Has patient fallen in last 6 months? No  LIVING ENVIRONMENT: Lives with: lives with their family Lives in: House/apartment Stairs: Yes: External: 5 steps; on right going up Has following equipment at home: Single point cane  OCCUPATION: not working  PLOF: Independent with basic ADLs Requires ad's  PATIENT GOALS: being able to move better than I can  now.  Walk more  NEXT MD VISIT: as needed  OBJECTIVE:  Note: Objective measures were completed at Evaluation unless otherwise noted.  DIAGNOSTIC FINDINGS:  TRANSESOPHOGEAL ECHO REPORT   FINDINGS Left Ventricle: Left ventricular ejection fraction, by estimation, is 30 to 35%. The left ventricle has moderately decreased function. The left ventricle demonstrates global hypokinesis. The left ventricular internal cavity size was normal in size. There is no left ventricular hypertrophy.   Right Ventricle: The right ventricular size is normal. No increase in right ventricular wall thickness. Right ventricular systolic function is normal.   Left [facility]: Left atrial size was normal in size. No left atrial/left atrial appendage thrombus was detected.   Right [facility]: Right atrial size was normal in size.   Pericardium: There is no evidence of pericardial effusion.   Mitral Valve: The mitral valve is grossly normal.  Mild mitral valve regurgitation. No evidence of mitral valve stenosis.   Tricuspid Valve: The tricuspid valve is normal in structure. Tricuspid valve regurgitation is mild . No evidence of tricuspid stenosis.  PATIENT SURVEYS:  LEFS 23/80  COGNITION: Overall cognitive status: Within functional limits for tasks assessed     SENSATION: WFL  POSTURE: left pelvic obliquity  PALPATION Crepitus bilat knees  LUMBAR ROM:   full  LOWER EXTREMITY ROM:     Active  Right eval Left eval  Hip flexion    Hip extension    Hip abduction    Hip adduction    Hip internal rotation    Hip external rotation    Knee flexion    Knee extension 0 0  Ankle dorsiflexion    Ankle plantarflexion    Ankle inversion    Ankle eversion     (Blank rows = not tested)  LOWER EXTREMITY MMT:    MMT Right eval Left eval  Hip flexion 43.0 55.9  Hip extension    Hip abduction 35.4 43.1  Hip adduction    Hip internal rotation    Hip external rotation    Knee flexion    Knee extension 38.0 26.6  Ankle dorsiflexion    Ankle plantarflexion    Ankle inversion    Ankle eversion     (Blank rows = not tested)   FUNCTIONAL TESTS:   Timed up and go (TUG): 22.81  GAIT: Distance walked: 140ft Assistive device utilized: None Level of assistance: Modified independence Comments: knees knock due to valgus deformities and lymphedema, hip circumduction for advancement of LE increased lat displacement  TREATMENT  Eval Self care: SPC vs rollator to and from setting.  Toleration concerns to aquatic exercises along with activities to prepare get here then back home. Ue use with transfers for safety; avoid low armless chairs  PATIENT EDUCATION:  Education details: Discussed eval findings, rehab rationale, aquatic program progression/POC and pools in area. Patient is in agreement   Person educated: Patient Education method: Explanation Education comprehension: verbalized understanding  HOME EXERCISE PROGRAM: tba  ASSESSMENT:  CLINICAL IMPRESSION: Patient is a 53 y.o. f who was seen today for physical therapy evaluation and treatment for deconditioning.  Pt limited by cardiac function (CHF) along with multiple other dx creating deconditioned state.  She presents [facility] pool [facility].  Reports she has tried to walk length to setting but was unable to due no AD and SOB. Testing demonstrates LLE weakness vs Right. Gait instability due to rle lymphedema  and OA bilateral knees. Pain reported in knees but not limiting function, function limited by SOB.  She has been in and out of hospital this year due to fluid overload.  Pt is a good candidate for aquatic intervention but I am uncertain how well she will tolerate getting to and from setting along with exercise completion.  Pt is in agreement that we will trial aquatic therapy and adjust as needed.  OBJECTIVE IMPAIRMENTS: Abnormal gait, cardiopulmonary status limiting activity, decreased activity tolerance, decreased balance, decreased endurance, decreased mobility, difficulty walking, decreased strength, increased edema, postural dysfunction, obesity, and pain.   ACTIVITY LIMITATIONS: carrying, lifting, bending, sitting, standing, squatting, stairs, transfers, and locomotion level  PARTICIPATION LIMITATIONS: meal prep, cleaning, laundry, shopping, community activity, occupation, and yard work  PERSONAL FACTORS: Fitness, Past/current experiences, Time since onset of injury/illness/exacerbation, and 3+ comorbidities: see PmHx are also affecting patient's functional outcome.   REHAB POTENTIAL: Fair multiple co morbidities affecting care  CLINICAL DECISION MAKING: Evolving/moderate complexity  EVALUATION COMPLEXITY: Moderate   GOALS: Goals reviewed with patient? Yes  SHORT TERM GOALS: Target date: 08/09/23  Pt will tolerate  full aquatic sessions consistently without increase in pain and with improving function to demonstrate good toleration and effectiveness of intervention.  Baseline: Goal status: INITIAL  2.  Pt will report reduction in knee pain by 50% with aquatic therapy intervention Baseline:  Goal status: INITIAL    LONG TERM GOALS: Target date: 09/10/23  Pt to improve on LEFS by at least 9 point to demonstrate statistically significant Improvement in function. Baseline: 23/80 Goal status: INITIAL  2.  Pt will tolerate walking to or from setting and entire aquatic session without excessive fatigue or pain Baseline:  Goal status: INITIAL  3.  Pt will improve on Tug test to <or=  18s to demonstrate improvement in lower extremity function, mobility and decreased fall risk. Baseline: 22.8 Goal status: INITIAL  4.  Pt will use modified Borg scale correctly and maintain exertional level  at 5-6/10 or moderate level for optimal physical benefits of exercise Baseline:  Goal status: INITIAL   PLAN:  PT FREQUENCY: 1x/week  PT DURATION: 8 weeks  PLANNED INTERVENTIONS: 97164- PT Re-evaluation, 97110-Therapeutic exercises, 97530- Therapeutic activity, 97112- Neuromuscular re-education, 97535- Self Care, 29518- Manual therapy, [id number]- Gait training, [phone]- Aquatic Therapy, [phone]- Ionotophoresis 4mg /ml Dexamethasone , Patient/Family education, Balance training, Stair training, Taping, Dry Needling, Joint mobilization, DME instructions, Cryotherapy, and Moist heat.  PLAN FOR NEXT SESSION: trial aquatics for strengthening, endurance training and pain management   Lucinda Saber) Wiliam Cauthorn MPT 07/16/23 5:44 PM [facility] [address] [place], [place], [address] Phone: [phone]   Fax:  [phone]   For all possible CPT codes, reference the Planned Interventions line above.     Check all conditions that are expected to impact treatment: {  Conditions  expected to impact treatment:Morbid obesity, Respiratory disorders, Diabetes mellitus, and Musculoskeletal disorders   If treatment provided at initial evaluation, no treatment charged due to lack of authorization.

## 2023-07-17 ENCOUNTER — Ambulatory Visit (HOSPITAL_BASED_OUTPATIENT_CLINIC_OR_DEPARTMENT_OTHER): Admitting: Physical Therapy

## 2023-07-19 NOTE — Patient Instructions (Incomplete)
 Welcome to [facility]!  Thank you for choosing us  for your Primary Care needs.   We offer in person and video appointments for your convenience. You may call our office to schedule appointments, or you may schedule appointments with me through MyChart.   The best way to get in contact with me is via MyChart message. This will get to me faster than a phone call, unless there is an emergency, then please call 911.  The lab is located downstairs in the Sports Medicine building, we also have xray available there.   We are checking labs today, will be in contact with any results that require further attention

## 2023-07-19 NOTE — Progress Notes (Deleted)
 New Patient Office Visit  Subjective    Patient ID: Alyssa Blanchard, female    DOB: May 15, 1970  Age: 53 y.o. MRN: [id number]  CC: No chief complaint on file.   HPI Alyssa Blanchard presents to establish care today. Reports compliance with medication regimen.  Denies other concerns today.  Outpatient Encounter Medications as of 07/20/2023  Medication Sig   acetaminophen  (TYLENOL ) 325 MG tablet Take 2 tablets (650 mg total) by mouth every 6 (six) hours as needed for mild pain (pain score 1-3) (or Fever >/= 101).   albuterol  (PROVENTIL ) (2.5 MG/3ML) 0.083% nebulizer solution Take 3 mLs by nebulization every 4 (four) hours as needed for wheezing or shortness of breath.   apixaban  (ELIQUIS ) 5 MG TABS tablet Take 1 tablet (5 mg total) by mouth 2 (two) times daily.   bisacodyl  (DULCOLAX) 5 MG EC tablet Take 1 tablet (5 mg total) by mouth daily as needed for moderate constipation.   bumetanide  (BUMEX ) 1 MG tablet Take 2 mg by mouth 2 (two) times daily.   cetirizine  (ZYRTEC ) 10 MG tablet Take 1 tablet (10 mg total) by mouth daily.   diclofenac  Sodium (VOLTAREN  ARTHRITIS PAIN) 1 % GEL Apply 2 g topically 2 (two) times daily.   docusate sodium  (COLACE) 100 MG capsule Take 200 mg by mouth as needed for mild constipation.   DULERA  200-5 MCG/ACT AERO Inhale 2 puffs into the lungs in the morning and at bedtime.   Dupilumab  (DUPIXENT ) 300 MG/2ML SOPN Inject 300 mg into the skin every 14 (fourteen) days.   iron  polysaccharides (NIFEREX) 150 MG capsule Take 1 capsule by mouth daily. (Patient not taking: Reported on 06/01/2023)   lidocaine  (LIDODERM ) 5 % Place 2 patches onto the skin daily.   Lifitegrast  (XIIDRA ) 5 % SOLN Place 1 drop into both eyes in the morning and at bedtime.   magnesium  oxide (MAG-OX) 400 MG tablet Take 1 tablet (400 mg total) by mouth daily.   melatonin 5 MG TABS Take 2 tablets (10 mg total) by mouth at bedtime.   metolazone  (ZAROXOLYN ) 5 MG tablet Take 1 tablet (5 mg total) by  mouth 3 (three) times a week. Take an extra 40mEq of potassium with Metolazone    metoprolol  succinate (TOPROL -XL) 25 MG 24 hr tablet Take 2 tablets (50 mg total) by mouth daily.   Multiple Vitamins-Minerals (BARIATRIC MULTIVITAMINS/IRON  PO) Take 1 tablet by mouth daily with breakfast.   pantoprazole  (PROTONIX ) 20 MG tablet Take 2 tablets (40 mg total) by mouth 2 (two) times daily.   polyethylene glycol (MIRALAX  / GLYCOLAX ) 17 g packet Take 17 g by mouth as needed.   potassium chloride  SA (KLOR-CON  M) 20 MEQ tablet Take 2 tablets (40 mEq total) by mouth daily. WITH TOMORROW DOSE OF METOLAZONE    pregabalin  (LYRICA ) 300 MG capsule Take 300 mg by mouth 2 (two) times daily.   senna-docusate (SENOKOT-S) 8.6-50 MG tablet Take 1 tablet by mouth at bedtime as needed for mild constipation.   silver  sulfADIAZINE  (SILVADENE ) 1 % cream Apply topically daily. Apply to right side   spironolactone  (ALDACTONE ) 25 MG tablet Take 2 tablets (50 mg total) by mouth daily.   VENTOLIN  HFA 108 (90 Base) MCG/ACT inhaler Inhale 2 puffs into the lungs every 6 (six) hours as needed for shortness of breath.   Facility-Administered Encounter Medications as of 07/20/2023  Medication   acetaminophen  (TYLENOL ) tablet 650 mg   ferric derisomaltose  (MONOFERRIC ) 1,000 mg in sodium chloride  0.9 % 100 mL infusion  Past Medical History:  Diagnosis Date   Acute on chronic congestive heart failure (HCC) 05/02/2023   Anxiety    Asthma    Atrial fibrillation (HCC)    Depression    DM (diabetes mellitus), type 2 (HCC) 02/16/2022   Dysrhythmia    new onset Afib rvr   GERD (gastroesophageal reflux disease)    Heart failure (HCC)    Heel spur    bilat   History of bronchitis    History of chicken pox    History of urinary tract infection    Hypertension    Migraines    Plantar fasciitis    bilat   STD (sexually transmitted disease)    chl hx & hsv 1&2   Tremors of nervous system    Urinary incontinence     Past  Surgical History:  Procedure Laterality Date   BIOPSY  10/14/2022   Procedure: BIOPSY;  Surgeon: Elois Hair, MD;  Location: [facility] ENDOSCOPY;  Service: Gastroenterology;;   CARDIOVERSION N/A 08/05/2019   Procedure: CARDIOVERSION;  Surgeon: Lake Pilgrim, MD;  Location[facility];  Service: Cardiovascular;  Laterality: N/A;   CARDIOVERSION N/A 04/28/2023   Procedure: CARDIOVERSION;  Surgeon: Hugh Madura, MD;  Location: [facility] INVASIVE CV LAB;  Service: Cardiovascular;  Laterality: N/A;   COLONOSCOPY WITH PROPOFOL  N/A 10/14/2022   Procedure: COLONOSCOPY WITH PROPOFOL ;  Surgeon: Elois Hair, MD;  Location: [facility] ENDOSCOPY;  Service: Gastroenterology;  Laterality: N/A;   ESOPHAGOGASTRODUODENOSCOPY (EGD) WITH PROPOFOL  N/A 10/14/2022   Procedure: ESOPHAGOGASTRODUODENOSCOPY (EGD) WITH PROPOFOL ;  Surgeon: Elois Hair, MD;  Location: [facility] ENDOSCOPY;  Service: Gastroenterology;  Laterality: N/A;   LAPAROSCOPIC GASTRIC SLEEVE RESECTION N/A 11/27/2014   Procedure: LAPAROSCOPIC GASTRIC SLEEVE RESECTION WITH HIATAL HERNIA REPAIR UPPER ENDOSCOPY;  Surgeon: Jacolyn Matar, MD;  Location: [facility] ORS;  Service: General;  Laterality: N/A;   POLYPECTOMY  10/14/2022   Procedure: POLYPECTOMY;  Surgeon: Elois Hair, MD;  Location: [facility] ENDOSCOPY;  Service: Gastroenterology;;   TEE WITHOUT CARDIOVERSION N/A 08/05/2019   Procedure: TRANSESOPHAGEAL ECHOCARDIOGRAM (TEE);  Surgeon: Alroy Aspen, Lela Purple, MD;  Location: [facility] ENDOSCOPY;  Service: Cardiovascular;  Laterality: N/A;   TONSILLECTOMY  1978    Family History  Problem Relation Age of Onset   Diabetes Mother    Hypertension Mother    Hyperlipidemia Mother    Kidney disease Mother        CKD stage 4   Pancreatic cancer Father        pancreatic   Hypertension Maternal Grandmother    Diabetes Maternal Grandmother    Heart failure Maternal Grandmother        CHF   Heart attack Maternal Grandfather    Hypertension Maternal Grandfather    Hypertension  Paternal Grandmother    Alzheimer's disease Paternal Grandmother    Hypertension Paternal Grandfather    Cancer Maternal Uncle        melanoma   Cancer Paternal Uncle        kidney   Colon cancer Neg Hx    Esophageal cancer Neg Hx    Stomach cancer Neg Hx     Social History   Socioeconomic History   Marital status: Single    Spouse name: Not on file   Number of children: 0   Years of education: 12   Highest education level: Some college, no degree  Occupational History   Occupation: Chartered certified accountant: [employer]   Occupation: Unemployed  Tobacco Use  Smoking status: Former    Current packs/day: 0.00    Average packs/day: 0.3 packs/day for 5.0 years (1.3 ttl pk-yrs)    Types: Cigarettes    Start date: 03/11/2007    Quit date: 03/10/2012    Years since quitting: 11.3   Smokeless tobacco: Never  Vaping Use   Vaping status: Never Used  Substance and Sexual Activity   Alcohol use: Not Currently    Comment: Reports history of binge drinking; last in 2021   Drug use: Not Currently    Types: Marijuana, Cocaine    Comment: last time used 10 years   Sexual activity: Yes    Partners: Male    Birth control/protection: Condom  Other Topics Concern   Not on file  Social History Narrative   Regular exercise-no   Caffeine Use-yes, 1-2 cups of caffeine daily   Social Drivers of Health   Financial Resource Strain: Low Risk  (04/30/2023)   Overall Financial Resource Strain (CARDIA)    Difficulty of Paying Living Expenses: Not very hard  Food Insecurity: Low Risk  (05/27/2023)   Received from [facility]   Hunger Vital Sign    Worried About Running Out of Food in the Last Year: Never true    Ran Out of Food in the Last Year: Never true  Transportation Needs: No Transportation Needs (05/27/2023)   Received from [facility]    In the past 12 months, has lack of reliable transportation kept you from medical appointments, meetings, work or from  getting things needed for daily living? : No  Recent Concern: Transportation Needs - Unmet Transportation Needs (04/24/2023)   PRAPARE - Administrator, Civil Service (Medical): Yes    Lack of Transportation (Non-Medical): No  Physical Activity: Inactive (01/29/2023)   Received from [facility]   Exercise Vital Sign    Days of Exercise per Week: 0 days    Minutes of Exercise per Session: 0 min  Stress: Stress Concern Present (11/24/2022)   [employer] of Occupational Health - Occupational Stress Questionnaire    Feeling of Stress : Rather much  Social Connections: Socially Isolated (11/24/2022)   Social Connection and Isolation Panel [NHANES]    Frequency of Communication with Friends and Family: Three times a week    Frequency of Social Gatherings with Friends and Family: Three times a week    Attends Religious Services: Never    Active Member of Clubs or Organizations: No    Attends Banker Meetings: Not on file    Marital Status: Never married  Intimate Partner Violence: Not At Risk (04/24/2023)   Humiliation, Afraid, Rape, and Kick questionnaire    Fear of Current or Ex-Partner: No    Emotionally Abused: No    Physically Abused: No    Sexually Abused: No    ROS Per HPI      Objective    LMP 12/20/2017   Physical Exam Vitals and nursing note reviewed.  Constitutional:      General: She is not in acute distress.    Appearance: Normal appearance. She is normal weight.  HENT:     Head: Normocephalic and atraumatic.     Right Ear: External ear normal.     Left Ear: External ear normal.     Nose: Nose normal.     Mouth/Throat:     Mouth: Mucous membranes are moist.     Pharynx: Oropharynx is clear.  Eyes:     Extraocular  Movements: Extraocular movements intact.     Pupils: Pupils are equal, round, and reactive to light.  Cardiovascular:     Rate and Rhythm: Normal rate and regular rhythm.     Pulses: Normal pulses.     Heart sounds:  Normal heart sounds.  Pulmonary:     Effort: Pulmonary effort is normal. No respiratory distress.     Breath sounds: Normal breath sounds. No wheezing, rhonchi or rales.  Musculoskeletal:        General: Normal range of motion.     Cervical back: Normal range of motion.     Right lower leg: No edema.     Left lower leg: No edema.  Lymphadenopathy:     Cervical: No cervical adenopathy.  Neurological:     General: No focal deficit present.     Mental Status: She is alert and oriented to person, place, and time.  Psychiatric:        Mood and Affect: Mood normal.        Thought Content: Thought content normal.         Assessment & Plan:   Paroxysmal atrial fibrillation (HCC)  Chronic combined systolic and diastolic congestive heart failure (HCC)  Hypertension, unspecified type  Type 2 diabetes mellitus with hyperglycemia, without long-term current use of insulin  (HCC)  Hypoventilation associated with obesity syndrome (HCC)  Other iron  deficiency anemia  Bipolar disorder, in full remission, most recent episode manic (HCC)  Anxiety     No follow-ups on file.   Wellington Half, FNP

## 2023-07-20 ENCOUNTER — Ambulatory Visit: Admitting: Family Medicine

## 2023-07-24 ENCOUNTER — Telehealth (HOSPITAL_COMMUNITY): Payer: Self-pay | Admitting: *Deleted

## 2023-07-24 NOTE — Progress Notes (Signed)
 ADVANCED HEART FAILURE CLINIC NOTE  Primary Care: Michele Ahle, MD Primary Cardiologist: Dr. Alease Amend  HPI: Alyssa Blanchard is a 53 y.o. female with a PMH of HFpEF, untreated OSA/OHS, asthma, PAF, morbid obesity w/ prior laparoscopic sleeve gastrectomy in 2016, DM II, and HTN.  Admitted to [facility] in 1/25 with acute on chronic HFpEF and RLE cellulitis.   She was readmitted to [facility] in 2/25 with volume overload, Afib with RVR. Echo with EF 70-75%, RV okay, RVSP 39 mmHg. Diuresed with IV lasix . Later underwent TEE/DCCV to SR. She was also treated for norovirus, RLE cellulitis, E. Coli UTI then candida vaginosis. Discharged to CIR, weight 392 lbs.   She saw [facility]/transplant team for consult 05/29/23.   Admitted multiple times in 2025 for volume overload.     SUBJECTIVE:  Today she returns for HF follow up. Overall feeling swollen and more SOB. Weight up 24 lbs; 377>>401 lbs. Not urinating as briskly on current diuretic regimen. She is SOB walking short distances on flat ground. Dizzy on Lyrica  + Toprol . Denies palpitations, abnormal bleeding, CP, or PND. Appetite ok. Chronically sleeps reclined in hospital bed. Weight at home 400 pounds. Taking all medications. She lives with her mother at home.   PMH, current medications, allergies, social history, and family history reviewed in epic.  Wt Readings from Last 3 Encounters:  07/27/23 (!) 182.3 kg (401 lb 12.8 oz)  06/16/23 (!) 176.7 kg (389 lb 9.6 oz)  06/01/23 (!) 176.9 kg (390 lb)   BP 134/74   Pulse 77   Wt (!) 182.3 kg (401 lb 12.8 oz)   LMP 12/20/2017   SpO2 95%   BMI 66.86 kg/m   PHYSICAL EXAM: General:  NAD. No resp difficulty, walked into clinic with cane HEENT: Normal Neck: Supple. No JVD. Thick neck Cor: Regular rate & rhythm. No rubs, gallops or murmurs. Lungs: Clear Abdomen: Soft, super morbid obesity, nontender, nondistended.  Extremities: No cyanosis, clubbing, rash, trace BLE edema Neuro: Alert &  oriented x 3, moves all 4 extremities w/o difficulty. Affect pleasant.  DATA REVIEW  ECG: 06/01/2023: Normal sinus rhythm  ECHO: 04/24/2023: LVEF 70 to 75%, normal E prime, normal RV systolic function, mildly elevated RVSP, no gross valvular abnormalities  CATH: R/LHC at [facility] 9/23: RA mean 24, PA mean 49, PCWP mean 34, PVR 1.3 WU, Fick CI 2.01, TD CI 4.07, LVEDP 32, no CAD     ASSESSMENT & PLAN:  1. Acute on Chronic diastolic heart failure: Multiple readmissions, volume status obviously difficult given body habitus. Suspect largely due to morbid obesity and untreated sleep apnea. - Worse NYHA III-IIIb, body habitus contributing. Weight up and more symptomatic, likely has volume on board. - Use Furoscix  + metolazone  5 mg + 40 KCL bid x 3 days (hold bumex  while using FUroscix ). - After 3 days, resume Bumex  2 mg bid + 40 KCL daily; and metolazone  5 mg/extra 40 KCL MWF - Continue spironolactone  50 mg daily - Continue Toprol  XL 50 mg daily - Not a Zoll HFMS candidate given insurance - Consider cardiomems if demonstrating compliance and having recurrent admissions. Would likely have to consider inpatient placement with insurance. We discussed this today; she is interested - Labs today.  2. Morbid obesity: Previous gastric sleeve. - Body mass index is 66.86 kg/m. - GLP-1 consideration  3. PAF: Previously on tikosyn , s/p DCCV 2/25.  - 2 day holter monitor (3/25, [facility]):  mostly NSR, frequent short runs of SVT, rare PVCs and PACs -  Regular on exam today. - Continue Eliquis  5 mg bid, no bleeding issues - Continue beta blocker  4. HTN: BP up a bit - Diurese as above - Add ARB next  5. OSA:  - Completed sleep study at [facility] - Await results  Follow up in 7-10 days with APP. She is high-risk for re-admission  [name], FNP-BC Advanced Heart Failure 07/27/23   FUROSCIX  prescribed  Patient viewed patient education video with QR code for FUROSCIX    QR code for  FUROSCIX  placed on AVS  Call FUROSCIX  Direct at [phone] for questions regarding on body infuser.  Day 1  FUROSCIX  80 mg once daily  via on body infuser + KDUR 40 + metolazone  5 mg + KDUR 40  Day 2  FUROSCIX  80 mg once daily  via on body infuser+ KDUR 40 + metolazone  5 mg + KDUR 40  Day 3 FUROSCIX   80 mg once daily  via on body infuser+ KDUR 40 + metolazone  5 mg + KDUR 40  Hold  bumex  while using Furoscix 

## 2023-07-24 NOTE — Telephone Encounter (Signed)
 Called to confirm/remind patient of their appointment at the [facility] Failure [facility] on 07/27/23.       Appointment:              [] Confirmed             [x] Left mess              [] No answer/No voice mail             [] Phone not in service   Patient reminded to bring all medications and/or complete list.   Confirmed patient has transportation. Gave directions, instructed to utilize valet parking.

## 2023-07-27 ENCOUNTER — Encounter (HOSPITAL_COMMUNITY): Payer: Self-pay

## 2023-07-27 ENCOUNTER — Ambulatory Visit (HOSPITAL_COMMUNITY)
Admission: RE | Admit: 2023-07-27 | Discharge: 2023-07-27 | Disposition: A | Discharge status: Home / Self Care | Source: Ambulatory Visit | Attending: Family Medicine | Admitting: Family Medicine

## 2023-07-27 ENCOUNTER — Ambulatory Visit (HOSPITAL_COMMUNITY): Payer: Self-pay | Admitting: Family Medicine

## 2023-07-27 VITALS — BP 134/74 | HR 77 | Wt >= 6400 oz

## 2023-07-27 DIAGNOSIS — Z6841 Body Mass Index (BMI) 40.0 and over, adult: Secondary | ICD-10-CM | POA: Diagnosis not present

## 2023-07-27 DIAGNOSIS — Z79899 Other long term (current) drug therapy: Secondary | ICD-10-CM | POA: Diagnosis not present

## 2023-07-27 DIAGNOSIS — J45909 Unspecified asthma, uncomplicated: Secondary | ICD-10-CM | POA: Insufficient documentation

## 2023-07-27 DIAGNOSIS — I110 Hypertensive heart disease with heart failure: Secondary | ICD-10-CM | POA: Insufficient documentation

## 2023-07-27 DIAGNOSIS — Z7901 Long term (current) use of anticoagulants: Secondary | ICD-10-CM | POA: Insufficient documentation

## 2023-07-27 DIAGNOSIS — E119 Type 2 diabetes mellitus without complications: Secondary | ICD-10-CM | POA: Diagnosis not present

## 2023-07-27 DIAGNOSIS — I480 Paroxysmal atrial fibrillation: Secondary | ICD-10-CM | POA: Diagnosis not present

## 2023-07-27 DIAGNOSIS — G4733 Obstructive sleep apnea (adult) (pediatric): Secondary | ICD-10-CM | POA: Insufficient documentation

## 2023-07-27 DIAGNOSIS — I5033 Acute on chronic diastolic (congestive) heart failure: Secondary | ICD-10-CM | POA: Diagnosis present

## 2023-07-27 DIAGNOSIS — I10 Essential (primary) hypertension: Secondary | ICD-10-CM

## 2023-07-27 DIAGNOSIS — Z9884 Bariatric surgery status: Secondary | ICD-10-CM | POA: Diagnosis not present

## 2023-07-27 LAB — BASIC METABOLIC PANEL WITH GFR
Anion gap: 9 (ref 5–15)
BUN: 10 mg/dL (ref 6–20)
CO2: 25 mmol/L (ref 22–32)
Calcium: 9.2 mg/dL (ref 8.9–10.3)
Chloride: 102 mmol/L (ref 98–111)
Creatinine, Ser: 0.78 mg/dL (ref 0.44–1.00)
GFR, Estimated: >60 mL/min
Glucose, Bld: 150 mg/dL — ABNORMAL HIGH (ref 70–99)
Potassium: 4 mmol/L (ref 3.5–5.1)
Sodium: 136 mmol/L (ref 135–145)

## 2023-07-27 LAB — MAGNESIUM: Magnesium: 1.9 mg/dL (ref 1.7–2.4)

## 2023-07-27 LAB — BRAIN NATRIURETIC PEPTIDE: B Natriuretic Peptide: 38.4 pg/mL (ref 0.0–100.0)

## 2023-07-27 NOTE — Patient Instructions (Addendum)
 Thank you for coming in today  If you had labs drawn today, any labs that are abnormal the clinic will call you No news is good news  Medications: Start Furoscix : Tuesday: Furoscix  and Metolazone  5 mg with Potassium 40 meq (take twice daily)   Wednesday: Furoscix  and Metolazone  5 mg with Potassium 40 meq (take twice daily)   Thursday: Furoscix  and Metolazone  5 mg with Potassium 40 meq (take twice daily)   Start Bumex :  Friday: Bumex  2 mg twice daily with Potassium 40 meq once daily  Then back to Metolazone  5 mg with M/W/F with extra 40 meq of Potassium  Follow up appointments:  Your physician recommends that you schedule a follow-up appointment in:  7-10 days in clinic   Do the following things EVERYDAY: Weigh yourself in the morning before breakfast. Write it down and keep it in a log. Take your medicines as prescribed Eat low salt foods--Limit salt (sodium) to 2000 mg per day.  Stay as active as you can everyday Limit all fluids for the day to less than 2 liters   At the [facility], you and your health needs are our priority. As part of our continuing mission to provide you with exceptional heart care, we have created designated Provider Care Teams. These Care Teams include your primary Cardiologist (physician) and Advanced Practice Providers (APPs- Physician Assistants and Nurse Practitioners) who all work together to provide you with the care you need, when you need it.   You may see any of the following providers on your designated Care Team at your next follow up: Dr Jules Oar Dr Peder Bourdon Dr. Mimi Alt, NP Ruddy Corral, [place] [facility] [place], [place] Dennise Fitz, NP Luster Salters, PharmD   Please be sure to bring in all your medications bottles to every appointment.    Thank you for choosing [facility]  If you have any questions or concerns before your next  appointment please send us  a message through [place] or call our office at [phone].    TO LEAVE A MESSAGE FOR THE NURSE SELECT OPTION 2, PLEASE LEAVE A MESSAGE INCLUDING: YOUR NAME DATE OF BIRTH CALL BACK NUMBER REASON FOR CALL**this is important as we prioritize the call backs  YOU WILL RECEIVE A CALL BACK THE SAME DAY AS LONG AS YOU CALL BEFORE 4:00 PM

## 2023-07-28 ENCOUNTER — Ambulatory Visit (HOSPITAL_BASED_OUTPATIENT_CLINIC_OR_DEPARTMENT_OTHER): Admitting: Physical Therapy

## 2023-07-29 ENCOUNTER — Telehealth (HOSPITAL_COMMUNITY): Payer: Self-pay | Admitting: Cardiology

## 2023-07-29 NOTE — Telephone Encounter (Signed)
 Unable to obtain plan over ride for [facility] will only cover #1 infuser  Pt has used 2/3 ordered Reports with infusers used the past two days, has not felt well, increase in dizziness, increase in light headed ness, reports she is pale in color and almost passed out in gas station  -update to plan of care needed

## 2023-07-29 NOTE — Telephone Encounter (Signed)
Pt aware.

## 2023-07-29 NOTE — Telephone Encounter (Signed)
 Yes

## 2023-08-04 ENCOUNTER — Ambulatory Visit (HOSPITAL_BASED_OUTPATIENT_CLINIC_OR_DEPARTMENT_OTHER): Admitting: Physical Therapy

## 2023-08-05 NOTE — Progress Notes (Incomplete)
 ADVANCED HEART FAILURE CLINIC NOTE  Primary Care: Michele Ahle, MD Primary Cardiologist: Dr. Alease Amend  HPI: Alyssa Blanchard is a 53 y.o. female with a PMH of HFpEF, untreated OSA/OHS, asthma, PAF, morbid obesity w/ prior laparoscopic sleeve gastrectomy in 2016, DM II, and HTN.  Admitted to [facility] in 1/25 with acute on chronic HFpEF and RLE cellulitis.   She was readmitted to [facility] in 2/25 with volume overload, Afib with RVR. Echo with EF 70-75%, RV okay, RVSP 39 mmHg. Diuresed with IV lasix . Later underwent TEE/DCCV to SR. She was also treated for norovirus, RLE cellulitis, E. Coli UTI then candida vaginosis. Discharged to CIR, weight 392 lbs.   She saw [facility] AHF/transplant team for consult 05/29/23.   Admitted multiple times in 2025 for volume overload.  SUBJECTIVE: AHF clinic visit last week patient with volume overload, up 24 lbs. Given furoscix  and metolazone .   Today she returns for AHF follow up. Overall feeling ***. Denies palpitations, CP, dizziness, edema, or PND/Orthopnea. *** SOB. Appetite ok. No fever or chills. Weight at home *** pounds. Taking all medications. Denies ETOH, tobacco or drug use. She lives with her mother at home.   PMH, current medications, allergies, social history, and family history reviewed in epic.  Wt Readings from Last 3 Encounters:  07/27/23 (!) 182.3 kg (401 lb 12.8 oz)  06/16/23 (!) 176.7 kg (389 lb 9.6 oz)  06/01/23 (!) 176.9 kg (390 lb)   LMP 12/20/2017   PHYSICAL EXAM: General:  *** appearing.  No respiratory difficulty HEENT: normal Neck: supple. JVD *** cm.  Cor: PMI nondisplaced. Regular rate & rhythm. No rubs, gallops or murmurs. Lungs: clear Abdomen: soft, nontender, nondistended. Good bowel sounds. Extremities: no cyanosis, clubbing, rash, edema  Neuro: alert & oriented x 3. Moves all 4 extremities w/o difficulty. Affect pleasant.   DATA REVIEW  ECG: 06/01/2023: Normal sinus rhythm  ECHO: 04/24/2023: LVEF 70 to 75%,  normal E prime, normal RV systolic function, mildly elevated RVSP, no gross valvular abnormalities  CATH: R/LHC at [facility] 9/23: RA mean 24, PA mean 49, PCWP mean 34, PVR 1.3 WU, Fick CI 2.01, TD CI 4.07, LVEDP 32, no CAD     ASSESSMENT & PLAN: 1. Acute *** on Chronic diastolic heart failure: Multiple readmissions, volume status obviously difficult given body habitus. Suspect largely due to morbid obesity and untreated sleep apnea. - Worse NYHA III-IIIb, body habitus contributing. Weight up and more symptomatic, likely has volume on board. *** - Last week she was diuresed with furoscix  and metolazone . *** - Continue Bumex  2 mg bid + 40 KCL daily; and metolazone  5 mg/extra 40 KCL MWF *** - Continue spironolactone  50 mg daily - Continue Toprol  XL 50 mg daily - Not a Zoll HFMS candidate given insurance - Consider cardiomems if demonstrating compliance and having recurrent admissions. Would likely have to consider inpatient placement with insurance. We discussed this today; she is interested - Labs today.  2. Morbid obesity: Previous gastric sleeve. - There is no height or weight on file to calculate BMI. - GLP-1 consideration  3. PAF: Previously on tikosyn , s/p DCCV 2/25.  - 2 day holter monitor (3/25, [facility]):  mostly NSR, frequent short runs of SVT, rare PVCs and PACs - Regular on exam today. - Continue Eliquis  5 mg bid, no bleeding issues - Continue beta blocker  4. HTN: BP up a bit - Diurese as above - Add ARB next ***  5. OSA:  - Completed sleep study at [facility]. Results with moderate OSA.***  - Has followed up with Dr. Matilde Son.   Follow up in 7-10 days with APP. She is high-risk for re-admission ***  Sheryl Donna AGACNP-BC  Advanced Heart Failure 08/05/23

## 2023-08-07 ENCOUNTER — Telehealth (HOSPITAL_COMMUNITY): Payer: Self-pay

## 2023-08-07 ENCOUNTER — Ambulatory Visit (HOSPITAL_COMMUNITY): Admitting: Clinical

## 2023-08-07 DIAGNOSIS — F419 Anxiety disorder, unspecified: Secondary | ICD-10-CM | POA: Diagnosis not present

## 2023-08-07 NOTE — Telephone Encounter (Signed)
 Called to confirm/remind patient of their appointment at the [facility] on 08/10/2023 1:30.   Appointment:   [] Confirmed  [x] Left mess   [] No answer/No voice mail  [] VM Full/unable to leave message  [] Phone not in service  Patient reminded to bring all medications and/or complete list.  Confirmed patient has transportation. Gave directions, instructed to utilize valet parking.
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
## 2023-08-10 ENCOUNTER — Inpatient Hospital Stay (HOSPITAL_BASED_OUTPATIENT_CLINIC_OR_DEPARTMENT_OTHER)
Admission: RE | Admit: 2023-08-10 | Discharge: 2023-08-10 | Disposition: A | Discharge status: Home / Self Care | Source: Ambulatory Visit

## 2023-08-12 ENCOUNTER — Ambulatory Visit (HOSPITAL_BASED_OUTPATIENT_CLINIC_OR_DEPARTMENT_OTHER): Admitting: Physical Therapy

## 2023-08-17 ENCOUNTER — Telehealth (HOSPITAL_COMMUNITY): Payer: Self-pay | Admitting: Cardiology

## 2023-08-17 NOTE — Telephone Encounter (Signed)
 Patient called to report EP started diltiazem  at last OV Reports while reviewing side effects noticed swelling and fluid retention.   Pt reports since she has a hard time with fluid levels wanted to be sure this is HF appropriate

## 2023-08-17 NOTE — Telephone Encounter (Signed)
 Needs to call EP because they ordered diltiazem .   Mayuri Staples NP-C  1:59 PM

## 2023-08-18 ENCOUNTER — Emergency Department (HOSPITAL_COMMUNITY)

## 2023-08-18 ENCOUNTER — Emergency Department (HOSPITAL_COMMUNITY)
Admission: EM | Admit: 2023-08-18 | Discharge: 2023-08-18 | Disposition: A | Discharge status: Home / Self Care | Attending: Emergency Medicine | Admitting: Emergency Medicine

## 2023-08-18 ENCOUNTER — Telehealth (HOSPITAL_COMMUNITY): Payer: Self-pay

## 2023-08-18 DIAGNOSIS — E871 Hypo-osmolality and hyponatremia: Secondary | ICD-10-CM | POA: Insufficient documentation

## 2023-08-18 DIAGNOSIS — Z91040 Latex allergy status: Secondary | ICD-10-CM | POA: Diagnosis not present

## 2023-08-18 DIAGNOSIS — R079 Chest pain, unspecified: Secondary | ICD-10-CM | POA: Insufficient documentation

## 2023-08-18 DIAGNOSIS — R55 Syncope and collapse: Secondary | ICD-10-CM

## 2023-08-18 DIAGNOSIS — E876 Hypokalemia: Secondary | ICD-10-CM | POA: Insufficient documentation

## 2023-08-18 DIAGNOSIS — I509 Heart failure, unspecified: Secondary | ICD-10-CM | POA: Diagnosis not present

## 2023-08-18 DIAGNOSIS — Z7901 Long term (current) use of anticoagulants: Secondary | ICD-10-CM | POA: Insufficient documentation

## 2023-08-18 DIAGNOSIS — K029 Dental caries, unspecified: Secondary | ICD-10-CM

## 2023-08-18 LAB — BASIC METABOLIC PANEL WITH GFR
Anion gap: 14 (ref 5–15)
BUN: 14 mg/dL (ref 6–20)
CO2: 21 mmol/L — ABNORMAL LOW (ref 22–32)
Calcium: 8.4 mg/dL — ABNORMAL LOW (ref 8.9–10.3)
Chloride: 99 mmol/L (ref 98–111)
Creatinine, Ser: 0.84 mg/dL (ref 0.44–1.00)
GFR, Estimated: >60 mL/min (ref >60)
Glucose, Bld: 195 mg/dL — ABNORMAL HIGH (ref 70–99)
Potassium: 3 mmol/L — ABNORMAL LOW (ref 3.5–5.1)
Sodium: 134 mmol/L — ABNORMAL LOW (ref 135–145)

## 2023-08-18 LAB — URINALYSIS, ROUTINE W REFLEX MICROSCOPIC
Bilirubin Urine: NEGATIVE
Glucose, UA: NEGATIVE mg/dL
Hgb urine dipstick: NEGATIVE
Ketones, ur: NEGATIVE mg/dL
Leukocytes,Ua: NEGATIVE
Nitrite: NEGATIVE
Protein, ur: NEGATIVE mg/dL
Specific Gravity, Urine: 1.012 (ref 1.005–1.030)
pH: 6 (ref 5.0–8.0)

## 2023-08-18 LAB — TROPONIN I (HIGH SENSITIVITY)
Troponin I (High Sensitivity): 6 ng/L (ref <18)
Troponin I (High Sensitivity): 4 ng/L (ref <18)

## 2023-08-18 LAB — CBG MONITORING, ED: Glucose-Capillary: 147 mg/dL — ABNORMAL HIGH (ref 70–99)

## 2023-08-18 LAB — CBC
HCT: 33.7 % — ABNORMAL LOW (ref 36.0–46.0)
Hemoglobin: 9.8 g/dL — ABNORMAL LOW (ref 12.0–15.0)
MCH: 24.7 pg — ABNORMAL LOW (ref 26.0–34.0)
MCHC: 29.1 g/dL — ABNORMAL LOW (ref 30.0–36.0)
MCV: 84.9 fL (ref 80.0–100.0)
Platelets: 155 10*3/uL (ref 150–400)
RBC: 3.97 MIL/uL (ref 3.87–5.11)
RDW: 17.8 % — ABNORMAL HIGH (ref 11.5–15.5)
WBC: 10.2 10*3/uL (ref 4.0–10.5)
nRBC: 0 % (ref 0.0–0.2)

## 2023-08-18 MED ORDER — AMOXICILLIN-POT CLAVULANATE 875-125 MG PO TABS: 1.0000 | ORAL_TABLET | Freq: Two times a day (BID) | ORAL | 0 refills | Status: DC

## 2023-08-18 MED ORDER — POTASSIUM CHLORIDE 20 MEQ PO PACK
60.0000 meq | PACK | Freq: Two times a day (BID) | ORAL | Status: DC
  Administered 2023-08-18: 60 meq via ORAL
  Filled 2023-08-18: qty 3

## 2023-08-18 MED ORDER — AMOXICILLIN-POT CLAVULANATE 875-125 MG PO TABS
1.0000 | ORAL_TABLET | Freq: Once | ORAL | Status: AC
  Administered 2023-08-18: 1 via ORAL
  Filled 2023-08-18: qty 1

## 2023-08-18 MED ORDER — MAGNESIUM OXIDE -MG SUPPLEMENT 400 (240 MG) MG PO TABS
800.0000 mg | ORAL_TABLET | Freq: Once | ORAL | Status: AC
  Administered 2023-08-18: 800 mg via ORAL
  Filled 2023-08-18: qty 2

## 2023-08-18 NOTE — ED Triage Notes (Signed)
 Pt BIB [facility] from home CP, SOB, dizziness, lighteheadedness.  Was out with mom shopping, didn't eat or drink much.  Symptoms occurred on way home.  Called 911 once arriving home.  EMS states pt was pale, warm, diaphoretic. 12-lead unremarkable.  Pt has hx of CHF.  CP, SOB subsided with 02 admin and encouraging relaxation.    130/76 HR 80's, 96% RA    20G L. FA.  4mg  zofran  given

## 2023-08-18 NOTE — ED Provider Notes (Signed)
  EMERGENCY DEPARTMENT AT [facility] Provider Note   CSN: [id number] Arrival date & time: 08/18/23  1325     History  No chief complaint on file.   Alyssa Blanchard is a 53 y.o. female.  53 yo F with a chief complaints of not feeling well.  She went shopping with her mother and then when she was almost back home while she was in the car she started feeling unwell.  She got really sweaty and felt kind of cold.  EMS was called.  Reportedly had low blood pressure.  Seem to improve over time.  She now feels a bit better.  She had some chest discomfort and felt she had palpitations when this occurred.  She chronically has CHF and feels like she has been improving without has been having a lot of urination recently.  Wonders if maybe she has a UTI but does not have any pain with it.        Home Medications Prior to Admission medications   Medication Sig Start Date End Date Taking? Authorizing Provider  acetaminophen  (TYLENOL ) 325 MG tablet Take 2 tablets (650 mg total) by mouth every 6 (six) hours as needed for mild pain (pain score 1-3) (or Fever >/= 101). 05/10/23   Angiulli, Everlyn Hockey, PA-C  albuterol  (PROVENTIL ) (2.5 MG/3ML) 0.083% nebulizer solution Take 3 mLs by nebulization every 4 (four) hours as needed for wheezing or shortness of breath. 05/12/23   Angiulli, Everlyn Hockey, PA-C  apixaban  (ELIQUIS ) 5 MG TABS tablet Take 1 tablet (5 mg total) by mouth 2 (two) times daily. 05/11/23 06/15/24  Angiulli, Everlyn Hockey, PA-C  bisacodyl  (DULCOLAX) 5 MG EC tablet Take 1 tablet (5 mg total) by mouth daily as needed for moderate constipation. 05/11/23   Angiulli, Everlyn Hockey, PA-C  bumetanide  (BUMEX ) 1 MG tablet Take 2 mg by mouth 2 (two) times daily.    [provider]  cetirizine  (ZYRTEC ) 10 MG tablet Take 1 tablet (10 mg total) by mouth daily. 05/11/23   Angiulli, Everlyn Hockey, PA-C  diclofenac  Sodium (VOLTAREN  ARTHRITIS PAIN) 1 % GEL Apply 2 g topically 2 (two) times daily. 05/11/23    Angiulli, Everlyn Hockey, PA-C  docusate sodium  (COLACE) 100 MG capsule Take 200 mg by mouth as needed for mild constipation.    [provider]  DULERA  200-5 MCG/ACT AERO Inhale 2 puffs into the lungs in the morning and at bedtime. 05/22/23     Dupilumab  (DUPIXENT ) 300 MG/2ML SOPN Inject 300 mg into the skin every 14 (fourteen) days.    [provider]  Lifitegrast  (XIIDRA ) 5 % SOLN Place 1 drop into both eyes in the morning and at bedtime.    [provider]  magnesium  oxide (MAG-OX) 400 MG tablet Take 1 tablet (400 mg total) by mouth daily. 05/11/23   Angiulli, Everlyn Hockey, PA-C  melatonin 5 MG TABS Take 2 tablets (10 mg total) by mouth at bedtime. 05/11/23   Angiulli, Everlyn Hockey, PA-C  metolazone  (ZAROXOLYN ) 5 MG tablet Take 1 tablet (5 mg total) by mouth 3 (three) times a week. Take an extra 40mEq of potassium with Metolazone  06/17/23   Lauralee Poll, MD  metoprolol  succinate (TOPROL -XL) 25 MG 24 hr tablet Take 2 tablets (50 mg total) by mouth daily. 06/16/23   Lauralee Poll, MD  Multiple Vitamins-Minerals (BARIATRIC MULTIVITAMINS/IRON  PO) Take 1 tablet by mouth daily with breakfast.    [provider]  pantoprazole  (PROTONIX ) 20 MG tablet Take 2  tablets (40 mg total) by mouth 2 (two) times daily. 05/11/23   Angiulli, Everlyn Hockey, PA-C  polyethylene glycol (MIRALAX  / GLYCOLAX ) 17 g packet Take 17 g by mouth as needed.    [provider]  potassium chloride  SA (KLOR-CON  M) 20 MEQ tablet Take 2 tablets (40 mEq total) by mouth daily. WITH TOMORROW DOSE OF METOLAZONE  06/16/23   Lauralee Poll, MD  pregabalin  (LYRICA ) 300 MG capsule Take 300 mg by mouth 2 (two) times daily.    [provider]  senna-docusate (SENOKOT-S) 8.6-50 MG tablet Take 1 tablet by mouth at bedtime as needed for mild constipation. 05/11/23   Angiulli, Everlyn Hockey, PA-C  silver  sulfADIAZINE  (SILVADENE ) 1 % cream Apply topically daily. Apply to right side 05/11/23   Angiulli, Everlyn Hockey, PA-C   spironolactone  (ALDACTONE ) 25 MG tablet Take 2 tablets (50 mg total) by mouth daily. 06/16/23   Lauralee Poll, MD  VENTOLIN  HFA 108 (90 Base) MCG/ACT inhaler Inhale 2 puffs into the lungs every 6 (six) hours as needed for shortness of breath. 05/11/23   Angiulli, Everlyn Hockey, PA-C      Allergies    Gabapentin, Topiramate , Albuterol , Hydroxyzine , Imitrex  [sumatriptan ], Latex, Metformin, Montelukast , Other, Prozac [fluoxetine], Semaglutide , Trazodone  and nefazodone, Advair diskus [fluticasone -salmeterol], and Copper-containing compounds    Review of Systems   Review of Systems  Physical Exam Updated Vital Signs BP (!) 114/58   Pulse 79   Temp 98.9 F (37.2 C) (Oral)   Resp 16   LMP 12/20/2017   SpO2 99%  Physical Exam Vitals and nursing note reviewed.  Constitutional:      General: She is not in acute distress.    Appearance: She is well-developed. She is not diaphoretic.     Comments: BMI 66  HENT:     Head: Normocephalic and atraumatic.  Eyes:     Pupils: Pupils are equal, round, and reactive to light.  Cardiovascular:     Rate and Rhythm: Normal rate and regular rhythm.     Heart sounds: No murmur heard.    No friction rub. No gallop.  Pulmonary:     Effort: Pulmonary effort is normal.     Breath sounds: No wheezing or rales.     Comments: Distant breath sounds Abdominal:     General: There is no distension.     Palpations: Abdomen is soft.     Tenderness: There is no abdominal tenderness.  Musculoskeletal:        General: No tenderness.     Cervical back: Normal range of motion and neck supple.     Right lower leg: No edema.     Left lower leg: No edema.  Skin:    General: Skin is warm and dry.  Neurological:     Mental Status: She is alert and oriented to person, place, and time.  Psychiatric:        Behavior: Behavior normal.     ED Results / Procedures / Treatments   Labs (all labs ordered are listed, but only abnormal results are displayed) Labs  Reviewed  BASIC METABOLIC PANEL WITH GFR - Abnormal; Notable for the following components:      Result Value   Sodium 134 (*)    Potassium 3.0 (*)    CO2 21 (*)    Glucose, Bld 195 (*)    Calcium 8.4 (*)    All other components within normal limits  CBC - Abnormal; Notable for the following components:   Hemoglobin  9.8 (*)    HCT 33.7 (*)    MCH 24.7 (*)    MCHC 29.1 (*)    RDW 17.8 (*)    All other components within normal limits  CBG MONITORING, ED - Abnormal; Notable for the following components:   Glucose-Capillary 147 (*)    All other components within normal limits  URINALYSIS, ROUTINE W REFLEX MICROSCOPIC  TROPONIN I (HIGH SENSITIVITY)  TROPONIN I (HIGH SENSITIVITY)    EKG EKG Interpretation Date/Time:  Tuesday August 18 2023 13:34:53 EDT Ventricular Rate:  81 PR Interval:  170 QRS Duration:  99 QT Interval:  404 QTC Calculation: 469 R Axis:   20  Text Interpretation: Sinus rhythm Low voltage, precordial leads No significant change since last tracing Confirmed by Albertus Hughs [id number]) on 08/18/2023 1:37:30 PM  Radiology DG Chest 2 View Result Date: 08/18/2023 CLINICAL DATA:  Chest pain. EXAM: CHEST - 2 VIEW COMPARISON:  May 25, 2023. FINDINGS: Stable cardiomegaly. Minimal subsegmental atelectasis or scarring is noted in both mid lungs. Bony thorax is unremarkable. IMPRESSION: Minimal subsegmental atelectasis or scarring noted in both mid lungs. Electronically Signed   By: Rosalene Colon M.D.   On: 08/18/2023 14:55    Procedures .1-3 Lead EKG Interpretation  Performed by: Albertus Hughs, DO Authorized by: Albertus Hughs, DO     Interpretation: normal     ECG rate:  79   ECG rate assessment: normal     Rhythm: sinus rhythm     Ectopy: none     Conduction: normal       Medications Ordered in ED Medications  potassium chloride  (KLOR-CON ) packet 60 mEq (has no administration in time range)  magnesium  oxide (MAG-OX) tablet 800 mg (has no administration in time range)     ED Course/ Medical Decision Making/ A&P                                 Medical Decision Making Amount and/or Complexity of Data Reviewed Labs: ordered. Radiology: ordered.  Risk OTC drugs. Prescription drug management.   53 yo F with a chief complaints of chest pain difficulty breathing.  This occurred when she was riding in the car earlier today.  She now feels quite a bit better.  By history this sounds like a vasovagal events.  Will obtain 2 troponins.  Blood work reassess.  Initial trop negative.  Mild anemia.    Hypokalemia.   Signed out to Dr. Leida Puna, awaiting second trop.    The patients results and plan were reviewed and discussed.   Any x-rays performed were independently reviewed by myself.   Differential diagnosis were considered with the presenting HPI.  Medications  potassium chloride  (KLOR-CON ) packet 60 mEq (has no administration in time range)  magnesium  oxide (MAG-OX) tablet 800 mg (has no administration in time range)    Vitals:   08/18/23 1335 08/18/23 1336  BP: (!) 114/58   Pulse: 79   Resp: 16   Temp:  98.9 F (37.2 C)  TempSrc:  Oral  SpO2: 99%     Final diagnoses:  Nonspecific chest pain    Admission/ observation were discussed with the admitting physician, patient and/or family and they are comfortable with the plan.         Final Clinical Impression(s) / ED Diagnoses Final diagnoses:  Nonspecific chest pain    Rx / DC Orders ED Discharge Orders     None  Albertus Hughs, DO 08/18/23 1623

## 2023-08-18 NOTE — Telephone Encounter (Signed)
 Pt returned call, she states she is concerned about retaining fluid again and also feels like her kidneys and bladder hurt and she is not urinating as she should. Pt last seen 5/19 and was given Furoscix  and metolazone  felt that help but now is retaining fluid again, appt sch for tom (Wed 6/11) at 8:15

## 2023-08-18 NOTE — Telephone Encounter (Signed)
 Called to confirm/remind patient of their appointment at [facility] on 08/19/2019 8:15.   Appointment:   [x] Confirmed. Currently in the ER not sure if they will keep her yet.   [] Left mess   [] No answer/No voice mail  [] VM Full/unable to leave message  [] Phone not in service  Patient reminded to bring all medications and/or complete list.  Confirmed patient has transportation. Gave directions, instructed to utilize valet parking.

## 2023-08-18 NOTE — ED Notes (Signed)
 CCMD called and notified

## 2023-08-18 NOTE — ED Provider Notes (Signed)
 Sitting in a car and suddenly started feeling unwell with palpitations with SOB and chest tightness.  Second episode here but no dysrhythmia while on the monitor.  Mild hypokalemia here and normal CR and trop.  CBC with microcytic anemia of 9.8 from 11.   Delta trop pending.  EKG without acute changes.  Pt feeling better now.  Potassium replaced.  At this time no indication for admission but would benefit from follow up with her cardiologist which she has a follow-up with the CHF clinic tomorrow.  Patient is also complaining about some new dental pain and has several dental caries will start on Augmentin  due to concern for possible early infection.  Delta troponin is negative and UA without signs of infection today.  All this was discussed with the patient.  She was given return precautions.  She was able to ambulate to the bathroom and remained asymptomatic.   Almond Army, MD 08/18/23 [phone]

## 2023-08-18 NOTE — Progress Notes (Signed)
 ADVANCED HEART FAILURE CLINIC NOTE  Primary Care: Michele Ahle, MD Primary Cardiologist: Dr. Alease Amend  HPI: Alyssa Blanchard is a 53 y.o. female with a PMH of HFpEF, untreated OSA/OHS, asthma, PAF, morbid obesity w/ prior laparoscopic sleeve gastrectomy in 2016, DM II, and HTN.  Admitted to [facility] in 1/25 with acute on chronic HFpEF and RLE cellulitis.   She was readmitted to [facility] in 2/25 with volume overload, Afib with RVR. Echo with EF 70-75%, RV okay, RVSP 39 mmHg. Diuresed with IV lasix . Later underwent TEE/DCCV to SR. She was also treated for norovirus, RLE cellulitis, E. Coli UTI then candida vaginosis. Discharged to CIR, weight 392 lbs.   She saw [facility] AHF/transplant team for consult 05/29/23.   Admitted multiple times in 2025 for volume overload.  Last seen 05/19. Had gained 24 lbs in a matter of 2 months. She was prescribed furoscix  and metolazone  X 3 days     SUBJECTIVE:  Here today for urgent HF follow-up. Weight down 6 lb from last visit, down 11 lb on home scale. Ate out last night and feels more bloated today. Typically retains fluid in legs and later the waist. Has a little edema now. Chronic shortness of breath with exertion. Responds well to current diuretics. Has appointment with pulmonary at [facility] tomorrow for OSA.   Reports she was seen in ED yesterday for palpitations, felt heart racing. By the time she arrived, ECG showed SR.  PMH, current medications, allergies, social history, and family history reviewed in epic.  Wt Readings from Last 3 Encounters:  08/19/23 (!) 179.3 kg (395 lb 3.2 oz)  07/27/23 (!) 182.3 kg (401 lb 12.8 oz)  06/16/23 (!) 176.7 kg (389 lb 9.6 oz)   BP 131/63   Pulse 74   Ht 5' 5 (1.651 m)   Wt (!) 179.3 kg (395 lb 3.2 oz)   LMP 12/20/2017   SpO2 94%   BMI 65.76 kg/m   PHYSICAL EXAM: General:  Well appearing. Neck: JVD difficult to assess d/t thick neck Cor: Regular rate & rhythm. No rubs, gallops or murmurs. Lungs:  diminished Abdomen: obese, soft, nontender, nondistended.  Extremities: 1+  Neuro: alert & orientedx3. Affect pleasant   DATA REVIEW  ECG: 08/18/23: NSR 06/01/2023: Normal sinus rhythm  ECHO: 04/24/2023: LVEF 70 to 75%, normal E prime, normal RV systolic function, mildly elevated RVSP, no gross valvular abnormalities  CATH: R/LHC at [facility] 9/23: RA mean 24, PA mean 49, PCWP mean 34, PVR 1.3 WU, Fick CI 2.01, TD CI 4.07, LVEDP 32, no CAD     ASSESSMENT & PLAN:  1. Acute on Chronic diastolic heart failure: Multiple readmissions, volume status obviously difficult given body habitus. Suspect largely due to morbid obesity and untreated sleep apnea. - NYHA III. Body habitus contributing. Weight down some but still appears volume up and ReDS elevated at 38%.  - Increase bumex  to 3 mg BID and increase KCL to 80 mEq daily (K low at 3.0 yesterday), continue metolazone  5 mg three times a week (w/ extra 40 KCL). - If continues to struggle with volume will redose with furoscix  - Continue spironolactone  50 mg daily - Continue Toprol  XL 50 mg daily - Not a Zoll HFMS candidate given insurance - Insurance will not cover Cardiomems either - Labs 1 week. BMET from yesterday reviewed.  2. Morbid obesity: Previous gastric sleeve. - Body mass index is 65.76 kg/m. - refer to pharmacy for GLP-1  3. PAF: Previously on tikosyn , s/p DCCV 2/25.  -  2 day holter monitor (3/25, [facility]):  mostly NSR, frequent short runs of SVT, rare PVCs and PACs - SR on ECG yesterday - Continue Eliquis  5 mg bid, no bleeding issues - Continue beta blocker - Sees EP at [facility]. Wants to keep care at [facility], will refer to our EP   4. HTN: BP above goal - Diurese as above - Consider ARB next visit  5. OSA:  - Sleep study at [facility] in 05/25 with moderate OSA -Sees pulmonary tomorrow -Refer for consideration of GLP-1  Follow-up: 6/23 with APP as scheduled  Gerilyn Kobus, PA-C Advanced Heart Failure 08/19/23

## 2023-08-18 NOTE — Discharge Instructions (Addendum)
 Your potassium today was low and make sure you are eating foods with potassium in it.  No signs of heart attack today and your heart rhythm here has been normal.  However you could have been in A-fib earlier today when you are feeling bad or your blood pressure may have dropped based on what you are describing.  Make sure you are eating and you can start the antibiotic in case your teeth are getting infected.  Follow-up with the CHF clinic tomorrow as planned if you start having returning symptoms today and it is not improved with sitting down and continues return to the emergency room.

## 2023-08-19 ENCOUNTER — Ambulatory Visit (HOSPITAL_COMMUNITY)
Admission: RE | Admit: 2023-08-19 | Discharge: 2023-08-19 | Disposition: A | Discharge status: Home / Self Care | Source: Ambulatory Visit | Attending: Physician Assistant | Admitting: Physician Assistant

## 2023-08-19 ENCOUNTER — Encounter (HOSPITAL_COMMUNITY): Payer: Self-pay

## 2023-08-19 VITALS — BP 131/63 | HR 74 | Ht 65.0 in | Wt 395.2 lb

## 2023-08-19 DIAGNOSIS — I110 Hypertensive heart disease with heart failure: Secondary | ICD-10-CM | POA: Insufficient documentation

## 2023-08-19 DIAGNOSIS — I5032 Chronic diastolic (congestive) heart failure: Secondary | ICD-10-CM

## 2023-08-19 DIAGNOSIS — J45909 Unspecified asthma, uncomplicated: Secondary | ICD-10-CM | POA: Diagnosis not present

## 2023-08-19 DIAGNOSIS — Z79899 Other long term (current) drug therapy: Secondary | ICD-10-CM | POA: Insufficient documentation

## 2023-08-19 DIAGNOSIS — I5033 Acute on chronic diastolic (congestive) heart failure: Secondary | ICD-10-CM | POA: Insufficient documentation

## 2023-08-19 DIAGNOSIS — I480 Paroxysmal atrial fibrillation: Secondary | ICD-10-CM | POA: Diagnosis not present

## 2023-08-19 DIAGNOSIS — I5042 Chronic combined systolic (congestive) and diastolic (congestive) heart failure: Secondary | ICD-10-CM | POA: Diagnosis not present

## 2023-08-19 DIAGNOSIS — Z6841 Body Mass Index (BMI) 40.0 and over, adult: Secondary | ICD-10-CM | POA: Insufficient documentation

## 2023-08-19 DIAGNOSIS — E119 Type 2 diabetes mellitus without complications: Secondary | ICD-10-CM | POA: Insufficient documentation

## 2023-08-19 DIAGNOSIS — G4733 Obstructive sleep apnea (adult) (pediatric): Secondary | ICD-10-CM | POA: Diagnosis not present

## 2023-08-19 DIAGNOSIS — I10 Essential (primary) hypertension: Secondary | ICD-10-CM

## 2023-08-19 DIAGNOSIS — Z9884 Bariatric surgery status: Secondary | ICD-10-CM | POA: Insufficient documentation

## 2023-08-19 DIAGNOSIS — Z7901 Long term (current) use of anticoagulants: Secondary | ICD-10-CM | POA: Diagnosis not present

## 2023-08-19 MED ORDER — BUMETANIDE 1 MG PO TABS: 3.0000 mg | ORAL_TABLET | Freq: Two times a day (BID) | ORAL | 3 refills | Status: DC

## 2023-08-19 MED ORDER — POTASSIUM CHLORIDE CRYS ER 20 MEQ PO TBCR: 80.0000 meq | EXTENDED_RELEASE_TABLET | Freq: Every day | ORAL | 3 refills | Status: DC

## 2023-08-19 NOTE — Patient Instructions (Signed)
 INCREASE Potassium to 80 mEq ( 4 Tabs) daily. PLEASE MAKE SURE YOU TAKE AN EXTRA 2 POTASSIUM TABLETS ON YOUR METOLAZONE  DAYS  INCREASE Bumex  to 3 mg ( 3 tabs) Twice daily  Blood work next week.  You have been referred to the HEART CARE PHARMACY TEAM.  They will call you to arrange your appointment.  You have been referred to the Electrophysiologist. They will call you to arrange your appointment.  KEEP FOLLOW UP AS SCHEDULED.  If you have any questions or concerns before your next appointment please send us  a message through [place] or call our office at [phone].    TO LEAVE A MESSAGE FOR THE NURSE SELECT OPTION 2, PLEASE LEAVE A MESSAGE INCLUDING: YOUR NAME DATE OF BIRTH CALL BACK NUMBER REASON FOR CALL**this is important as we prioritize the call backs  YOU WILL RECEIVE A CALL BACK THE SAME DAY AS LONG AS YOU CALL BEFORE 4:00 PM  At the Advanced Heart Failure Clinic, you and your health needs are our priority. As part of our continuing mission to provide you with exceptional heart care, we have created designated Provider Care Teams. These Care Teams include your primary Cardiologist (physician) and Advanced Practice Providers (APPs- Physician Assistants and Nurse Practitioners) who all work together to provide you with the care you need, when you need it.   You may see any of the following providers on your designated Care Team at your next follow up: Dr Jules Oar Dr Peder Bourdon Dr. Alwin Baars Dr. Arta Lark Amy Marijane Shoulders, NP Ruddy Corral, [place] [facility] [place], [place] Dennise Fitz, NP [place] Lee, NP Shawnee Dellen, NP Luster Salters, PharmD Bevely Brush, PharmD   Please be sure to bring in all your medications bottles to every appointment.    Thank you for choosing [facility]

## 2023-08-19 NOTE — Progress Notes (Signed)
 ReDS Vest / Clip - 08/19/23 0815       ReDS Vest / Clip   Station Marker B    Ruler Value 42    ReDS Value Range Moderate volume overload    ReDS Actual Value 38

## 2023-08-20 ENCOUNTER — Ambulatory Visit (HOSPITAL_BASED_OUTPATIENT_CLINIC_OR_DEPARTMENT_OTHER): Admitting: Physical Therapy

## 2023-08-24 ENCOUNTER — Ambulatory Visit: Attending: Cardiovascular Disease | Admitting: Cardiovascular Disease

## 2023-08-24 ENCOUNTER — Telehealth (HOSPITAL_BASED_OUTPATIENT_CLINIC_OR_DEPARTMENT_OTHER): Payer: Self-pay | Admitting: Cardiovascular Disease

## 2023-08-24 ENCOUNTER — Encounter: Payer: Self-pay | Admitting: Cardiovascular Disease

## 2023-08-24 VITALS — BP 108/66 | HR 74 | Ht 65.0 in | Wt >= 6400 oz

## 2023-08-24 DIAGNOSIS — I480 Paroxysmal atrial fibrillation: Secondary | ICD-10-CM | POA: Insufficient documentation

## 2023-08-24 MED ORDER — FLECAINIDE ACETATE 100 MG PO TABS: ORAL_TABLET | ORAL | 0 refills | Status: DC

## 2023-08-24 NOTE — Telephone Encounter (Signed)
 Patient is calling wanting to know if the pharmD referral placed to our office was for Zepbound. She reports if so, she started on it today.   Due to this she is wanting to know if the appt is still needed. Please advise.

## 2023-08-24 NOTE — Patient Instructions (Addendum)
 Medication Instructions:  START Flecainide 100 mg twice daily ONLY AS NEEDED  *If you need a refill on your cardiac medications before your next appointment, please call your pharmacy*   Follow-Up: At [facility], you and your health needs are our priority.  As part of our continuing mission to provide you with exceptional heart care, our providers are all part of one team.  This team includes your primary Cardiologist (physician) and Advanced Practice Providers or APPs (Physician Assistants and Nurse Practitioners) who all work together to provide you with the care you need, when you need it.  Your next appointment:   6 month(s)  Provider:   You will follow up in the Atrial Fibrillation Clinic located at [facility]. Your provider will be: Clint R. Fenton, PA-C or Minnie Amber, PA-C

## 2023-08-24 NOTE — Progress Notes (Signed)
 Electrophysiology Office Note:    Date:  08/24/2023   ID:  Alyssa Blanchard, DOB 18-Dec-1970, MRN [id number]  PCP:  Ruel Cotta, Heinz Llano, MD   [facility] Providers Cardiologist:  Maudine Sos, MD     Referring MD: Arleene Belt, *   History of Present Illness:    Alyssa Blanchard is a 53 y.o. female with a medical history significant for persistent atrial fibrillation, heart failure with preserved ejection fraction, sleep apnea, referred for management of atrial fibrillation.     She has been admitted multiple times in 2025 for volume overload.  She was seen in the ER in June 2025 for palpitations.by the time she arrived to the ER, she was in sinus rhythm.  She has been on Tikosyn  in the past but quit taking it for some unknown reason and was not interested in going to the hospital to resume it.  She was followed at [facility] where she was recently being managed with metoprolol  50 mg daily.  At her last visit (10 days ago) diltiazem  CD180 milligrams was added.  Discussed the use of AI scribe software for clinical note transcription with the patient, who gave verbal consent to proceed.  History of Present Illness Alyssa Blanchard is a 53 year old female with atrial fibrillation who presents for management of her condition.  She was first diagnosed with atrial fibrillation in 2021, experiencing symptoms of a rapid heartbeat and difficulty breathing, which prompted her to seek medical attention. Initially treated with Tikosyn , she found it effective but faced limitations due to drug interactions. She has undergone two cardioversions, the most recent in February 2025. Her episodes of atrial fibrillation occur unpredictably and do not resolve spontaneously. Recently, she visited the emergency room due to a significant drop in blood pressure, although she was in normal sinus rhythm at that time.  She has a history of congestive heart failure and is under the care of a heart  failure clinic, which has helped reduce her hospital admissions. Her past workup includes two stress tests, with the most recent in March 2025, showing no reversible ischemia or infarction and a normal left ventricular ejection fraction of 70%. An echocardiogram from February 2025 showed a left ventricular ejection fraction of 70-75%.  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 Her kidney function was last noted to be stable, although her potassium levels were low, which has since been addressed.  Recently diagnosed with sleep apnea, she is awaiting a CPAP machine, which is expected to aid in managing her blood pressure and heart failure. She has also started a new medication, Zepbound.  She experiences palpitations and a significant drop in blood pressure recently, but no current symptoms of atrial fibrillation. No immediate need to call emergency services during episodes of atrial fibrillation.         Today, she is at baseline and reports she feels well.  Her primary complaint today is arthritic pain from walking around and so she is using a wheelchair at present.  EKGs/Labs/Other Studies Reviewed Today:     Echocardiogram:  TEE Feb 20254 LVEF 70 to 75%.  Normal RV.  Grossly normal valvular function.  Normal biatrial size     EKG:   EKG Interpretation Date/Time:  Monday August 24 2023 14:44:48 EDT Ventricular Rate:  74 PR Interval:  156 QRS Duration:  80 QT Interval:  394 QTC Calculation: 437  R Axis:   4  Text Interpretation: Normal sinus rhythm with sinus arrhythmia Cannot rule out Anterior infarct , age undetermined When compared with ECG of 18-Aug-2023 13:34, No significant change was found Confirmed by Marlane Silver [phone]) on 08/24/2023 3:11:19 PM     Physical Exam:    VS:  BP 108/66   Pulse 74   Ht 5' 5 (1.651 m)   Wt (!) 400 lb (181.4 kg)   LMP 12/20/2017   SpO2 98%    BMI 66.56 kg/m     Wt Readings from Last 3 Encounters:  08/24/23 (!) 400 lb (181.4 kg)  08/19/23 (!) 395 lb 3.2 oz (179.3 kg)  07/27/23 (!) 401 lb 12.8 oz (182.3 kg)     GEN: Well nourished, well developed in no acute distress CARDIAC: RRR, no murmurs, rubs, gallops RESPIRATORY:  Normal work of breathing MUSCULOSKELETAL: no edema    ASSESSMENT & PLAN:     Persistent atrial fibrillation Contributing to exacerbations of CHF Status post DC cardioversion February 2025 She has discontinued Tikosyn  due to interactions with other medications She has had a negative stress test in the past 6 months; in the absence of coronary disease or structural heart abnormalities, I think flecainide is reasonable --she can try flecainide 100 mg twice daily as needed for recurrence of atrial flutter.  At this point, episodes are sufficiently rare that I do not think that scheduled antiarrhythmic is warranted at this time  Secondary hypercoagulable state Continue apixaban  5 mg p.o. twice daily  Morbid obesity Precludes consideration of ablation We discussed the importance of weight loss for maintenance of sinus rhythm  Obstructive sleep apnea We discussed the importance of compliance with CPAP for maintenance of sinus rhythm   Signed, Efraim Grange, MD  08/24/2023 3:11 PM    [facility]

## 2023-08-25 ENCOUNTER — Ambulatory Visit (HOSPITAL_BASED_OUTPATIENT_CLINIC_OR_DEPARTMENT_OTHER): Admitting: Physical Therapy

## 2023-08-26 ENCOUNTER — Ambulatory Visit (HOSPITAL_COMMUNITY)
Admission: RE | Admit: 2023-08-26 | Discharge: 2023-08-26 | Disposition: A | Discharge status: Home / Self Care | Source: Ambulatory Visit | Attending: Cardiology | Admitting: Cardiology

## 2023-08-26 ENCOUNTER — Ambulatory Visit (HOSPITAL_COMMUNITY): Payer: Self-pay | Admitting: Physician Assistant

## 2023-08-26 ENCOUNTER — Ambulatory Visit (INDEPENDENT_AMBULATORY_CARE_PROVIDER_SITE_OTHER): Admitting: Podiatry

## 2023-08-26 ENCOUNTER — Encounter: Payer: Self-pay | Admitting: Podiatry

## 2023-08-26 DIAGNOSIS — I5032 Chronic diastolic (congestive) heart failure: Secondary | ICD-10-CM | POA: Insufficient documentation

## 2023-08-26 DIAGNOSIS — E1142 Type 2 diabetes mellitus with diabetic polyneuropathy: Secondary | ICD-10-CM | POA: Diagnosis not present

## 2023-08-26 DIAGNOSIS — E119 Type 2 diabetes mellitus without complications: Secondary | ICD-10-CM

## 2023-08-26 LAB — BASIC METABOLIC PANEL WITH GFR
Anion gap: 9 (ref 5–15)
BUN: 11 mg/dL (ref 6–20)
CO2: 23 mmol/L (ref 22–32)
Calcium: 8.6 mg/dL — ABNORMAL LOW (ref 8.9–10.3)
Chloride: 104 mmol/L (ref 98–111)
Creatinine, Ser: 0.95 mg/dL (ref 0.44–1.00)
GFR, Estimated: >60 mL/min (ref >60)
Glucose, Bld: 130 mg/dL — ABNORMAL HIGH (ref 70–99)
Potassium: 4.1 mmol/L (ref 3.5–5.1)
Sodium: 136 mmol/L (ref 135–145)

## 2023-08-26 LAB — BRAIN NATRIURETIC PEPTIDE: B Natriuretic Peptide: 51.8 pg/mL (ref 0.0–100.0)

## 2023-08-26 NOTE — Progress Notes (Signed)
  Subjective:  Patient ID: Alyssa Blanchard, female    DOB: 02/10/1971,   MRN: [id number]  Chief Complaint  Patient presents with   Diabetes    RM#6 [facility] patient states is here for diabetes foot check tingling in feet.States blood sugars under control at this time.    53 y.o. female presents for diabetic foot exam. Relates worsening neuropathy pain in her feet recenlty. Relates burning and tingling in their feet and numbness in feet. She is unable to take gabapentin due to allergy . She is currently on Lyrica  and not providing much relief.  Patient is diabetic and last A1c was  Lab Results  Component Value Date   HGBA1C 5.5 03/25/2022   .   PCP:  Ruel Cotta, Heinz Llano, MD    . Denies any other pedal complaints. Denies n/v/f/c.   Past Medical History:  Diagnosis Date   Acute on chronic congestive heart failure (HCC) 05/02/2023   Anxiety    Asthma    Atrial fibrillation (HCC)    Depression    DM (diabetes mellitus), type 2 (HCC) 02/16/2022   Dysrhythmia    new onset Afib rvr   GERD (gastroesophageal reflux disease)    Heart failure (HCC)    Heel spur    bilat   History of bronchitis    History of chicken pox    History of urinary tract infection    Hypertension    Migraines    Plantar fasciitis    bilat   STD (sexually transmitted disease)    chl hx & hsv 1&2   Tremors of nervous system    Urinary incontinence     Objective:  Physical Exam: Vascular: DP/PT pulses 2/4 bilateral. CFT <3 seconds. Normal hair growth on digits. No edema.  Skin. No lacerations or abrasions bilateral feet. Nails 1-5 bilateral are normal in appearance. Some xerosis noted to bilateral plantar feet Musculoskeletal: MMT 5/5 bilateral lower extremities in DF, PF, Inversion and Eversion. Deceased ROM in DF of ankle joint.  Neurological: Sensation intact to light touch. Protective sensation intact.   Assessment:   1. Type 2 diabetes mellitus with peripheral neuropathy (HCC)       Plan:  Patient  was evaluated and treated and all questions answered. -Discussed and educated patient on diabetic foot care, especially with  regards to the vascular, neurological and musculoskeletal systems.  -Stressed the importance of good glycemic control and the detriment of not  controlling glucose levels in relation to the foot. -Discussed supportive shoes at all times and checking feet regularly.  -Answered all patient questions -will look into Qtenza to see if this is an option for her.  -Patient to return  in 1 year for diabetic foot check.  -Patient advised to call the office if any problems or questions arise in the meantime.   Jennefer Moats, DPM

## 2023-08-26 NOTE — Telephone Encounter (Signed)
 Does not need PharmD appt  then unless she would like training or education on Zepbound

## 2023-08-30 ENCOUNTER — Telehealth: Payer: Self-pay | Admitting: Internal Medicine

## 2023-08-30 NOTE — Telephone Encounter (Signed)
 Patient Phone Call   Received page regarding patient, called back. Patient stated she feels like she in atrial fibrillation. He HR is ranging between 130-150 bpm. Her HR has been steadily increasing all day. Her O2 sat has been sitting around 90% (normally 96-98%). She took the flecainide one hour ago. She took metoprolol  succinate 50mg  this am (takes daily), and then this evening took metoprolol  succinate 25mg  once this evening.  Plan: -Instructed patient to take an extra dose of metoprolol  25mg  (total to 50mg ) -Informed patient that given she just took the flecainide, needs additional time for meds to be absorbed and hopefully convert her back to sinus -Instructed her that if her heart rates persistently are elevated >140s overnight, should come to the ED -Instructed her that if O2 sats drop to < 89% needs to go to the ED -Additional strict return precautions provided for CP, SOB, n/v, presyncope, syncope -Pt has clinic appointment tomorrow at noon   Prentice Netters, MD Cardiology

## 2023-08-31 ENCOUNTER — Ambulatory Visit (HOSPITAL_COMMUNITY): Payer: Self-pay | Admitting: Family Medicine

## 2023-08-31 ENCOUNTER — Ambulatory Visit (HOSPITAL_COMMUNITY)
Admission: RE | Admit: 2023-08-31 | Discharge: 2023-08-31 | Disposition: A | Discharge status: Home / Self Care | Source: Ambulatory Visit | Attending: Family Medicine | Admitting: Family Medicine

## 2023-08-31 ENCOUNTER — Encounter (HOSPITAL_COMMUNITY): Payer: Self-pay

## 2023-08-31 VITALS — BP 120/84 | HR 88 | Wt 375.6 lb

## 2023-08-31 DIAGNOSIS — Z79899 Other long term (current) drug therapy: Secondary | ICD-10-CM | POA: Diagnosis not present

## 2023-08-31 DIAGNOSIS — E119 Type 2 diabetes mellitus without complications: Secondary | ICD-10-CM | POA: Diagnosis not present

## 2023-08-31 DIAGNOSIS — I5032 Chronic diastolic (congestive) heart failure: Secondary | ICD-10-CM | POA: Insufficient documentation

## 2023-08-31 DIAGNOSIS — Z9884 Bariatric surgery status: Secondary | ICD-10-CM | POA: Insufficient documentation

## 2023-08-31 DIAGNOSIS — Z7901 Long term (current) use of anticoagulants: Secondary | ICD-10-CM | POA: Diagnosis not present

## 2023-08-31 DIAGNOSIS — J45909 Unspecified asthma, uncomplicated: Secondary | ICD-10-CM | POA: Insufficient documentation

## 2023-08-31 DIAGNOSIS — G4733 Obstructive sleep apnea (adult) (pediatric): Secondary | ICD-10-CM | POA: Insufficient documentation

## 2023-08-31 DIAGNOSIS — Z6841 Body Mass Index (BMI) 40.0 and over, adult: Secondary | ICD-10-CM | POA: Diagnosis not present

## 2023-08-31 DIAGNOSIS — I10 Essential (primary) hypertension: Secondary | ICD-10-CM

## 2023-08-31 DIAGNOSIS — I110 Hypertensive heart disease with heart failure: Secondary | ICD-10-CM | POA: Diagnosis not present

## 2023-08-31 DIAGNOSIS — I480 Paroxysmal atrial fibrillation: Secondary | ICD-10-CM | POA: Diagnosis not present

## 2023-08-31 DIAGNOSIS — R0602 Shortness of breath: Secondary | ICD-10-CM | POA: Insufficient documentation

## 2023-08-31 LAB — BASIC METABOLIC PANEL WITH GFR
Anion gap: 17 — ABNORMAL HIGH (ref 5–15)
BUN: 21 mg/dL — ABNORMAL HIGH (ref 6–20)
CO2: 23 mmol/L (ref 22–32)
Calcium: 9.6 mg/dL (ref 8.9–10.3)
Chloride: 92 mmol/L — ABNORMAL LOW (ref 98–111)
Creatinine, Ser: 1.24 mg/dL — ABNORMAL HIGH (ref 0.44–1.00)
GFR, Estimated: 52 mL/min — ABNORMAL LOW (ref >60)
Glucose, Bld: 224 mg/dL — ABNORMAL HIGH (ref 70–99)
Potassium: 2.8 mmol/L — ABNORMAL LOW (ref 3.5–5.1)
Sodium: 132 mmol/L — ABNORMAL LOW (ref 135–145)

## 2023-08-31 LAB — BRAIN NATRIURETIC PEPTIDE: B Natriuretic Peptide: 13.2 pg/mL (ref 0.0–100.0)

## 2023-08-31 MED ORDER — POTASSIUM CHLORIDE CRYS ER 20 MEQ PO TBCR: EXTENDED_RELEASE_TABLET | ORAL | Status: DC

## 2023-08-31 NOTE — Patient Instructions (Addendum)
 There has been no changes to your medications.  Labs done today, your results will be available in MyChart, we will contact you for abnormal readings.  Your physician recommends that you schedule a follow-up appointment in: 3 months.  If you have any questions or concerns before your next appointment please send us  a message through [place] or call our office at [phone].    TO LEAVE A MESSAGE FOR THE NURSE SELECT OPTION 2, PLEASE LEAVE A MESSAGE INCLUDING: YOUR NAME DATE OF BIRTH CALL BACK NUMBER REASON FOR CALL**this is important as we prioritize the call backs  YOU WILL RECEIVE A CALL BACK THE SAME DAY AS LONG AS YOU CALL BEFORE 4:00 PM  At the Advanced Heart Failure Clinic, you and your health needs are our priority. As part of our continuing mission to provide you with exceptional heart care, we have created designated Provider Care Teams. These Care Teams include your primary Cardiologist (physician) and Advanced Practice Providers (APPs- Physician Assistants and Nurse Practitioners) who all work together to provide you with the care you need, when you need it.   You may see any of the following providers on your designated Care Team at your next follow up: Dr Jules Oar Dr Peder Bourdon Dr. Alwin Baars Dr. Arta Lark Amy Marijane Shoulders, NP Ruddy Corral, [place] [facility] [place], [place] Dennise Fitz, NP [place] Lee, NP Shawnee Dellen, NP Luster Salters, PharmD Bevely Brush, PharmD   Please be sure to bring in all your medications bottles to every appointment.    Thank you for choosing [facility]

## 2023-08-31 NOTE — Progress Notes (Signed)
 ADVANCED HEART FAILURE CLINIC NOTE  Primary Care: [name], MD Primary Cardiologist: Dr. Zenaida  HPI: Alyssa Blanchard is a 53 y.o. female with a PMH of HFpEF, untreated OSA/OHS, asthma, PAF, morbid obesity w/ prior laparoscopic sleeve gastrectomy in 2016, DM II, and HTN.  Admitted to [facility] in 1/25 with acute on chronic HFpEF and RLE cellulitis.   She was readmitted to [facility] in 2/25 with volume overload, Afib with RVR. Echo with EF 70-75%, RV okay, RVSP 39 mmHg. Diuresed with IV lasix . Later underwent TEE/DCCV to SR. She was also treated for norovirus, RLE cellulitis, E. Coli UTI then candida vaginosis. Discharged to CIR, weight 392 lbs.   She saw [facility] AHF/transplant team for consult 05/29/23.   Admitted multiple times in 2025 for volume overload.  Follow up 07/27/23, had gained 24 lbs in 2 months. She was prescribed furoscix  and metolazone  x 3 days.   Seen in ED 08/18/23 for AF. Urgent follow up 08/19/23, Bumex  increased to 3 mg bid, continued metolazone  3x/week.  Follow up with EP 6/25, given flecainide for PRN use for AF episodes. Not an ablation candidate at current weight.     SUBJECTIVE:  Today she returns for HF follow up. She remains SOB walking short distances on flat ground, more-so when she is in AF. She has CP when she is in AF and HR >120. Denies abnormal bleeding, dizziness, or PND/Orthopnea. Appetite ok. Taking all medications. Has only been taking Bumex  3 mg daily, she was dizzy on bid dose. Weight at home 373 pounds. Now on Zepbound, down 25 lbs in last 2 weeks. Has CPAP now, has not started wearing it yet.  PMH, current medications, allergies, social history, and family history reviewed in epic.  Wt Readings from Last 3 Encounters:  08/31/23 (!) 170.4 kg (375 lb 9.6 oz)  08/24/23 (!) 181.4 kg (400 lb)  08/19/23 (!) 179.3 kg (395 lb 3.2 oz)   BP 120/84   Pulse 88   Wt (!) 170.4 kg (375 lb 9.6 oz)   LMP 12/20/2017   SpO2 97%   BMI 62.50 kg/m    PHYSICAL EXAM: General:  NAD. No resp difficulty, arrived in [facility] HEENT: Normal Neck: Supple. Thick neck so JVP difficult Cor: Regular rate & rhythm. No rubs, gallops or murmurs. Lungs: Clear Abdomen: Soft, obese, nontender, nondistended.  Extremities: No cyanosis, clubbing, rash, edema Neuro: Alert & oriented x 3, moves all 4 extremities w/o difficulty. Affect pleasant.   DATA REVIEW  ECG: 08/18/23: NSR 06/01/2023: Normal sinus rhythm  ECHO: 04/24/2023: LVEF 70 to 75%, normal E prime, normal RV systolic function, mildly elevated RVSP, no gross valvular abnormalities  CATH: R/LHC at [facility] 9/23: RA mean 24, PA mean 49, PCWP mean 34, PVR 1.3 WU, Fick CI 2.01, TD CI 4.07, LVEDP 32, no CAD    ASSESSMENT & PLAN:  1. Chronic diastolic heart failure: Multiple readmissions, volume status obviously difficult given body habitus. Suspect largely due to morbid obesity and untreated sleep apnea. - NYHA III. Body habitus contributing. Weight down with addition on GLP1, does not appear markedly volume overloaded. - Continue Bumex  3 mg daily (orthostatic on 3 mg bid) + metolazone  5 mg/extra 40 KCL 3x/week. - Continue spironolactone  50 mg daily. - Continue Toprol  XL 50 mg daily. - Not a Zoll HFMS nor Cardiomems candidate given insurance - Labs today.  2. Morbid obesity: Previous gastric sleeve. - Body mass index is 62.5 kg/m. - Now on GLP-1, down 25 lbs in past  2 weeks  3. PAF: Previously on tikosyn , s/p DCCV 2/25.  - 2 day holter monitor (3/25, [facility]):  mostly NSR, frequent short runs of SVT, rare PVCs and PACs - Now established with [facility] EP, and plugged into AF clinic - Regular on exam today. - Continue Eliquis  5 mg bid, no bleeding issues - Continue beta blocker - She has flecainide for PRN use  4. HTN: BP stable - Continue current meds - Could consider ARB/ARNi, but will hold off weigh weight loss.  5. OSA:  - Sleep study at [facility] in 05/25 with moderate OSA - Has CPAP,  planning on starting soon  6. Pre-Op - Having EGD at [facility] on 09/04/23 to re-evaluate previous gastric sleeve - She is at acceptable risk for procedure from CV standpoint; body habitus and OSA contribute to her risk - I cautioned her on holding her GLP1 1 week before planned anesthesia.  Follow up in 3-4 months with Dr. Zenaida Harlene Gainer, FNP-BC Advanced Heart Failure 08/31/23

## 2023-08-31 NOTE — Telephone Encounter (Signed)
 Pt aware, agreeable, and verbalized understanding  Labs ordered and scheduled. Rx updated

## 2023-09-01 ENCOUNTER — Ambulatory Visit (HOSPITAL_BASED_OUTPATIENT_CLINIC_OR_DEPARTMENT_OTHER): Admitting: Physical Therapy

## 2023-09-04 ENCOUNTER — Ambulatory Visit (HOSPITAL_COMMUNITY)
Admission: RE | Admit: 2023-09-04 | Discharge: 2023-09-04 | Disposition: A | Discharge status: Home / Self Care | Source: Ambulatory Visit | Attending: Internal Medicine

## 2023-09-04 ENCOUNTER — Telehealth (HOSPITAL_BASED_OUTPATIENT_CLINIC_OR_DEPARTMENT_OTHER): Payer: Self-pay | Admitting: Cardiovascular Disease

## 2023-09-04 ENCOUNTER — Ambulatory Visit (HOSPITAL_COMMUNITY): Payer: Self-pay | Admitting: Family Medicine

## 2023-09-04 DIAGNOSIS — I5032 Chronic diastolic (congestive) heart failure: Secondary | ICD-10-CM | POA: Insufficient documentation

## 2023-09-04 LAB — BASIC METABOLIC PANEL WITH GFR
Anion gap: 17 — ABNORMAL HIGH (ref 5–15)
BUN: 19 mg/dL (ref 6–20)
CO2: 20 mmol/L — ABNORMAL LOW (ref 22–32)
Calcium: 9.2 mg/dL (ref 8.9–10.3)
Chloride: 100 mmol/L (ref 98–111)
Creatinine, Ser: 0.86 mg/dL (ref 0.44–1.00)
GFR, Estimated: >60 mL/min (ref >60)
Glucose, Bld: 157 mg/dL — ABNORMAL HIGH (ref 70–99)
Potassium: 3.8 mmol/L (ref 3.5–5.1)
Sodium: 137 mmol/L (ref 135–145)

## 2023-09-04 NOTE — Telephone Encounter (Signed)
 Patient returned staff call and stated she already received her prescription for Zepbound and wants to know if she still needs to have a visit with the pharmacist.

## 2023-09-22 ENCOUNTER — Ambulatory Visit (HOSPITAL_COMMUNITY): Admitting: Clinical

## 2023-10-20 ENCOUNTER — Ambulatory Visit (HOSPITAL_BASED_OUTPATIENT_CLINIC_OR_DEPARTMENT_OTHER): Admitting: Family

## 2023-10-21 DIAGNOSIS — D6869 Other thrombophilia: Secondary | ICD-10-CM | POA: Insufficient documentation

## 2023-10-21 DIAGNOSIS — Z7901 Long term (current) use of anticoagulants: Secondary | ICD-10-CM | POA: Insufficient documentation

## 2023-10-21 DIAGNOSIS — I4811 Longstanding persistent atrial fibrillation: Secondary | ICD-10-CM | POA: Insufficient documentation

## 2023-10-21 DIAGNOSIS — Z79899 Other long term (current) drug therapy: Secondary | ICD-10-CM | POA: Insufficient documentation

## 2023-10-21 NOTE — Progress Notes (Deleted)
 New Patient Visit  Subjective:     Patient ID: Alyssa Blanchard, female    DOB: 1970/10/25, 53 y.o.   MRN: [id number]  No chief complaint on file.   HPI  Discussed the use of AI scribe software for clinical note transcription with the patient, who gave verbal consent to proceed.  History of Present Illness      ROS Per HPI  Outpatient Encounter Medications as of 10/23/2023  Medication Sig   acetaminophen  (TYLENOL ) 325 MG tablet Take 2 tablets (650 mg total) by mouth every 6 (six) hours as needed for mild pain (pain score 1-3) (or Fever >/= 101).   albuterol  (PROVENTIL ) (2.5 MG/3ML) 0.083% nebulizer solution Take 3 mLs by nebulization every 4 (four) hours as needed for wheezing or shortness of breath.   apixaban  (ELIQUIS ) 5 MG TABS tablet Take 1 tablet (5 mg total) by mouth 2 (two) times daily.   bumetanide  (BUMEX ) 1 MG tablet Take 3 tablets (3 mg total) by mouth 2 (two) times daily. (Patient taking differently: Take 3 mg by mouth daily.)   cetirizine  (ZYRTEC ) 10 MG tablet Take 1 tablet (10 mg total) by mouth daily.   diclofenac  Sodium (VOLTAREN  ARTHRITIS PAIN) 1 % GEL Apply 2 g topically 2 (two) times daily.   docusate sodium  (COLACE) 100 MG capsule Take 200 mg by mouth as needed for mild constipation.   Dupilumab  (DUPIXENT ) 300 MG/2ML SOPN Inject 300 mg into the skin every 14 (fourteen) days.   flecainide  (TAMBOCOR ) 100 MG tablet Take 1 tablet by mouth twice daily ONLY AS NEEDED - max 2 tablets in a 24 hour period   Lifitegrast  (XIIDRA ) 5 % SOLN Place 1 drop into both eyes in the morning and at bedtime.   magnesium  oxide (MAG-OX) 400 MG tablet Take 1 tablet (400 mg total) by mouth daily.   melatonin 5 MG TABS Take 2 tablets (10 mg total) by mouth at bedtime.   metolazone  (ZAROXOLYN ) 5 MG tablet Take 1 tablet (5 mg total) by mouth 3 (three) times a week. Take an extra 40mEq of potassium with Metolazone    metoprolol  succinate (TOPROL -XL) 25 MG 24 hr tablet Take 2 tablets (50 mg  total) by mouth daily.   Multiple Vitamins-Minerals (BARIATRIC MULTIVITAMINS/IRON  PO) Take 1 tablet by mouth daily with breakfast.   pantoprazole  (PROTONIX ) 20 MG tablet Take 2 tablets (40 mg total) by mouth 2 (two) times daily.   polyethylene glycol (MIRALAX  / GLYCOLAX ) 17 g packet Take 17 g by mouth as needed.   potassium chloride  SA (KLOR-CON  M) 20 MEQ tablet Take 4 tablets (80 mEq total) by mouth every morning AND 2 tablets (40 mEq total) every evening.   pregabalin  (LYRICA ) 300 MG capsule Take 300 mg by mouth 2 (two) times daily.   silver  sulfADIAZINE  (SILVADENE ) 1 % cream Apply topically daily. Apply to right side   spironolactone  (ALDACTONE ) 25 MG tablet Take 2 tablets (50 mg total) by mouth daily.   ZEPBOUND 2.5 MG/0.5ML Pen Inject 2.5 mg into the skin once a week.   [DISCONTINUED] DULERA  200-5 MCG/ACT AERO Inhale 2 puffs into the lungs in the morning and at bedtime.   Facility-Administered Encounter Medications as of 10/23/2023  Medication   acetaminophen  (TYLENOL ) tablet 650 mg   ferric derisomaltose  (MONOFERRIC ) 1,000 mg in sodium chloride  0.9 % 100 mL infusion    Past Medical History:  Diagnosis Date   Acute on chronic congestive heart failure (HCC) 05/02/2023   Anxiety    Asthma  Atrial fibrillation (HCC)    Depression    DM (diabetes mellitus), type 2 (HCC) 02/16/2022   Dysrhythmia    new onset Afib rvr   GERD (gastroesophageal reflux disease)    Heart failure (HCC)    Heel spur    bilat   History of bronchitis    History of chicken pox    History of urinary tract infection    Hypertension    Migraines    Plantar fasciitis    bilat   STD (sexually transmitted disease)    chl hx & hsv 1&2   Tremors of nervous system    Urinary incontinence     Past Surgical History:  Procedure Laterality Date   BIOPSY  10/14/2022   Procedure: BIOPSY;  Surgeon: Stacia Glendia BRAVO, MD;  Location: [facility];  Service: Gastroenterology;;   CARDIOVERSION N/A 08/05/2019    Procedure: CARDIOVERSION;  Surgeon: Alveta Aleene PARAS, MD;  Location: [facility];  Service: Cardiovascular;  Laterality: N/A;   CARDIOVERSION N/A 04/28/2023   Procedure: CARDIOVERSION;  Surgeon: Jeffrie Oneil BROCKS, MD;  Location: [facility];  Service: Cardiovascular;  Laterality: N/A;   COLONOSCOPY WITH PROPOFOL  N/A 10/14/2022   Procedure: COLONOSCOPY WITH PROPOFOL ;  Surgeon: Stacia Glendia BRAVO, MD;  Location: [facility];  Service: Gastroenterology;  Laterality: N/A;   ESOPHAGOGASTRODUODENOSCOPY (EGD) WITH PROPOFOL  N/A 10/14/2022   Procedure: ESOPHAGOGASTRODUODENOSCOPY (EGD) WITH PROPOFOL ;  Surgeon: Stacia Glendia BRAVO, MD;  Location: [facility];  Service: Gastroenterology;  Laterality: N/A;   LAPAROSCOPIC GASTRIC SLEEVE RESECTION N/A 11/27/2014   Procedure: LAPAROSCOPIC GASTRIC SLEEVE RESECTION WITH HIATAL HERNIA REPAIR UPPER ENDOSCOPY;  Surgeon: Donnice Lunger, MD;  Location: [facility];  Service: General;  Laterality: N/A;   POLYPECTOMY  10/14/2022   Procedure: POLYPECTOMY;  Surgeon: Stacia Glendia BRAVO, MD;  Location: [facility];  Service: Gastroenterology;;   TEE WITHOUT CARDIOVERSION N/A 08/05/2019   Procedure: TRANSESOPHAGEAL ECHOCARDIOGRAM (TEE);  Surgeon: Alveta, Aleene PARAS, MD;  Location: [facility] ENDOSCOPY;  Service: Cardiovascular;  Laterality: N/A;   TONSILLECTOMY  1978    Family History  Problem Relation Age of Onset   Diabetes Mother    Hypertension Mother    Hyperlipidemia Mother    Kidney disease Mother        CKD stage 4   Pancreatic cancer Father        pancreatic   Hypertension Maternal Grandmother    Diabetes Maternal Grandmother    Heart failure Maternal Grandmother        CHF   Heart attack Maternal Grandfather    Hypertension Maternal Grandfather    Hypertension Paternal Grandmother    Alzheimer's disease Paternal Grandmother    Hypertension Paternal Grandfather    Cancer Maternal Uncle        melanoma   Cancer Paternal Uncle        kidney   Colon cancer Neg Hx     Esophageal cancer Neg Hx    Stomach cancer Neg Hx     Social History   Socioeconomic History   Marital status: Single    Spouse name: Not on file   Number of children: 0   Years of education: 12   Highest education level: Some college, no degree  Occupational History   Occupation: Chartered certified accountant: [employer]   Occupation: Unemployed  Tobacco Use   Smoking status: Former    Current packs/day: 0.00    Average packs/day: 0.3 packs/day for 5.0 years (1.3 ttl pk-yrs)    Types:  Cigarettes    Start date: 03/11/2007    Quit date: 03/10/2012    Years since quitting: 11.6   Smokeless tobacco: Never  Vaping Use   Vaping status: Never Used  Substance and Sexual Activity   Alcohol use: Not Currently    Comment: Reports history of binge drinking; last in 2021   Drug use: Not Currently    Types: Marijuana, Cocaine    Comment: last time used 10 years   Sexual activity: Yes    Partners: Male    Birth control/protection: Condom  Other Topics Concern   Not on file  Social History Narrative   Regular exercise-no   Caffeine Use-yes, 1-2 cups of caffeine daily   Social Drivers of Health   Financial Resource Strain: Low Risk  (04/30/2023)   Overall Financial Resource Strain (CARDIA)    Difficulty of Paying Living Expenses: Not very hard  Food Insecurity: Low Risk  (08/20/2023)   Received from [facility]   Hunger Vital Sign    Within the past 12 months, you worried that your food would run out before you got money to buy more: Never true    Within the past 12 months, the food you bought just didn't last and you didn't have money to get more. : Never true  Transportation Needs: No Transportation Needs (08/20/2023)   Received from [facility]    In the past 12 months, has lack of reliable transportation kept you from medical appointments, meetings, work or from getting things needed for daily living? : No  Physical Activity: Inactive (01/29/2023)    Received from [facility]   Exercise Vital Sign    On average, how many days per week do you engage in moderate to strenuous exercise (like a brisk walk)?: 0 days    On average, how many minutes do you engage in exercise at this level?: 0 min  Stress: Stress Concern Present (11/24/2022)   [employer] of Occupational Health - Occupational Stress Questionnaire    Feeling of Stress : Rather much  Social Connections: Socially Isolated (11/24/2022)   Social Connection and Isolation Panel    Frequency of Communication with Friends and Family: Three times a week    Frequency of Social Gatherings with Friends and Family: Three times a week    Attends Religious Services: Never    Active Member of Clubs or Organizations: No    Attends Banker Meetings: Not on file    Marital Status: Never married  Intimate Partner Violence: Not At Risk (04/24/2023)   Humiliation, Afraid, Rape, and Kick questionnaire    Fear of Current or Ex-Partner: No    Emotionally Abused: No    Physically Abused: No    Sexually Abused: No       Objective:    LMP 12/20/2017    Physical Exam Vitals and nursing note reviewed.  Constitutional:      General: She is not in acute distress.    Appearance: Normal appearance. She is normal weight.  HENT:     Head: Normocephalic and atraumatic.     Right Ear: External ear normal.     Left Ear: External ear normal.     Nose: Nose normal.     Mouth/Throat:     Mouth: Mucous membranes are moist.     Pharynx: Oropharynx is clear.  Eyes:     Extraocular Movements: Extraocular movements intact.     Pupils: Pupils are equal, round, and reactive to light.  Cardiovascular:     Rate and Rhythm: Normal rate and regular rhythm.     Pulses: Normal pulses.     Heart sounds: Normal heart sounds.  Pulmonary:     Effort: Pulmonary effort is normal. No respiratory distress.     Breath sounds: Normal breath sounds. No wheezing, rhonchi or rales.  Musculoskeletal:         General: Normal range of motion.     Cervical back: Normal range of motion.     Right lower leg: No edema.     Left lower leg: No edema.  Lymphadenopathy:     Cervical: No cervical adenopathy.  Neurological:     General: No focal deficit present.     Mental Status: She is alert and oriented to person, place, and time.  Psychiatric:        Mood and Affect: Mood normal.        Thought Content: Thought content normal.     No results found for any visits on 10/23/23.      Assessment & Plan:   Assessment and Plan Assessment & Plan      No orders of the defined types were placed in this encounter.    No orders of the defined types were placed in this encounter.   No follow-ups on file.  Corean LITTIE Ku, FNP

## 2023-10-21 NOTE — Patient Instructions (Incomplete)
 Welcome to [facility]!  Thank you for choosing us  for your Primary Care needs.   We offer in person and video appointments for your convenience. You may call our office to schedule appointments, or you may schedule appointments with me through MyChart.   The best way to get in contact with me is via MyChart message. This will get to me faster than a phone call, unless there is an emergency, then please call 911.  The lab is located downstairs in the Sports Medicine building, we also have xray available there.   We are checking labs today, will be in contact with any results that require further attention

## 2023-10-23 ENCOUNTER — Ambulatory Visit: Admitting: Family Medicine

## 2023-10-23 DIAGNOSIS — I5042 Chronic combined systolic (congestive) and diastolic (congestive) heart failure: Secondary | ICD-10-CM

## 2023-10-23 DIAGNOSIS — I4811 Longstanding persistent atrial fibrillation: Secondary | ICD-10-CM

## 2023-10-23 DIAGNOSIS — D508 Other iron deficiency anemias: Secondary | ICD-10-CM

## 2023-10-23 DIAGNOSIS — E1165 Type 2 diabetes mellitus with hyperglycemia: Secondary | ICD-10-CM

## 2023-10-23 DIAGNOSIS — Z79899 Other long term (current) drug therapy: Secondary | ICD-10-CM

## 2023-10-23 DIAGNOSIS — I480 Paroxysmal atrial fibrillation: Secondary | ICD-10-CM

## 2023-10-23 DIAGNOSIS — F3174 Bipolar disorder, in full remission, most recent episode manic: Secondary | ICD-10-CM

## 2023-10-23 DIAGNOSIS — Z7901 Long term (current) use of anticoagulants: Secondary | ICD-10-CM

## 2023-10-23 DIAGNOSIS — I10 Essential (primary) hypertension: Secondary | ICD-10-CM

## 2023-10-23 DIAGNOSIS — F419 Anxiety disorder, unspecified: Secondary | ICD-10-CM

## 2023-10-23 DIAGNOSIS — E662 Morbid (severe) obesity with alveolar hypoventilation: Secondary | ICD-10-CM

## 2023-10-23 DIAGNOSIS — Z9884 Bariatric surgery status: Secondary | ICD-10-CM

## 2023-11-04 ENCOUNTER — Other Ambulatory Visit (HOSPITAL_COMMUNITY): Payer: Self-pay

## 2023-11-11 ENCOUNTER — Emergency Department (HOSPITAL_COMMUNITY)
Admission: EM | Admit: 2023-11-11 | Discharge: 2023-11-11 | Disposition: A | Discharge status: Home / Self Care | Attending: Emergency Medicine | Admitting: Emergency Medicine

## 2023-11-11 ENCOUNTER — Other Ambulatory Visit: Payer: Self-pay

## 2023-11-11 ENCOUNTER — Emergency Department (HOSPITAL_COMMUNITY)

## 2023-11-11 DIAGNOSIS — K029 Dental caries, unspecified: Secondary | ICD-10-CM | POA: Diagnosis not present

## 2023-11-11 DIAGNOSIS — R42 Dizziness and giddiness: Secondary | ICD-10-CM | POA: Diagnosis present

## 2023-11-11 DIAGNOSIS — Z79899 Other long term (current) drug therapy: Secondary | ICD-10-CM | POA: Insufficient documentation

## 2023-11-11 DIAGNOSIS — Z7901 Long term (current) use of anticoagulants: Secondary | ICD-10-CM | POA: Insufficient documentation

## 2023-11-11 DIAGNOSIS — J45909 Unspecified asthma, uncomplicated: Secondary | ICD-10-CM | POA: Diagnosis not present

## 2023-11-11 DIAGNOSIS — R0789 Other chest pain: Secondary | ICD-10-CM | POA: Diagnosis not present

## 2023-11-11 DIAGNOSIS — R079 Chest pain, unspecified: Secondary | ICD-10-CM

## 2023-11-11 DIAGNOSIS — E119 Type 2 diabetes mellitus without complications: Secondary | ICD-10-CM | POA: Diagnosis not present

## 2023-11-11 DIAGNOSIS — R600 Localized edema: Secondary | ICD-10-CM | POA: Insufficient documentation

## 2023-11-11 DIAGNOSIS — Z91040 Latex allergy status: Secondary | ICD-10-CM | POA: Diagnosis not present

## 2023-11-11 DIAGNOSIS — R5383 Other fatigue: Secondary | ICD-10-CM | POA: Diagnosis not present

## 2023-11-11 DIAGNOSIS — I509 Heart failure, unspecified: Secondary | ICD-10-CM | POA: Insufficient documentation

## 2023-11-11 LAB — URINALYSIS, ROUTINE W REFLEX MICROSCOPIC
Bilirubin Urine: NEGATIVE
Glucose, UA: NEGATIVE mg/dL
Hgb urine dipstick: NEGATIVE
Ketones, ur: NEGATIVE mg/dL
Leukocytes,Ua: NEGATIVE
Nitrite: NEGATIVE
Protein, ur: NEGATIVE mg/dL
Specific Gravity, Urine: 1.005 (ref 1.005–1.030)
pH: 7 (ref 5.0–8.0)

## 2023-11-11 LAB — TROPONIN T, HIGH SENSITIVITY
Troponin T High Sensitivity: <15 ng/L (ref 0–19)
Troponin T High Sensitivity: <15 ng/L (ref 0–19)

## 2023-11-11 LAB — CBC WITH DIFFERENTIAL/PLATELET
Abs Immature Granulocytes: 0.06 K/uL (ref 0.00–0.07)
Basophils Absolute: 0.1 K/uL (ref 0.0–0.1)
Basophils Relative: 1 %
Eosinophils Absolute: 0.1 K/uL (ref 0.0–0.5)
Eosinophils Relative: 1 %
HCT: 35 % — ABNORMAL LOW (ref 36.0–46.0)
Hemoglobin: 10.1 g/dL — ABNORMAL LOW (ref 12.0–15.0)
Immature Granulocytes: 1 %
Lymphocytes Relative: 18 %
Lymphs Abs: 1.3 K/uL (ref 0.7–4.0)
MCH: 24.4 pg — ABNORMAL LOW (ref 26.0–34.0)
MCHC: 28.9 g/dL — ABNORMAL LOW (ref 30.0–36.0)
MCV: 84.5 fL (ref 80.0–100.0)
Monocytes Absolute: 0.7 K/uL (ref 0.1–1.0)
Monocytes Relative: 10 %
Neutro Abs: 4.8 K/uL (ref 1.7–7.7)
Neutrophils Relative %: 69 %
Platelets: 146 K/uL — ABNORMAL LOW (ref 150–400)
RBC: 4.14 MIL/uL (ref 3.87–5.11)
RDW: 17.8 % — ABNORMAL HIGH (ref 11.5–15.5)
WBC: 7 K/uL (ref 4.0–10.5)
nRBC: 0 % (ref 0.0–0.2)

## 2023-11-11 LAB — COMPREHENSIVE METABOLIC PANEL WITH GFR
ALT: 20 U/L (ref 0–44)
AST: 33 U/L (ref 15–41)
Albumin: 4 g/dL (ref 3.5–5.0)
Alkaline Phosphatase: 93 U/L (ref 38–126)
Anion gap: 13 (ref 5–15)
BUN: 6 mg/dL (ref 6–20)
CO2: 25 mmol/L (ref 22–32)
Calcium: 8.9 mg/dL (ref 8.9–10.3)
Chloride: 98 mmol/L (ref 98–111)
Creatinine, Ser: 0.65 mg/dL (ref 0.44–1.00)
GFR, Estimated: >60 mL/min (ref >60)
Glucose, Bld: 142 mg/dL — ABNORMAL HIGH (ref 70–99)
Potassium: 3.7 mmol/L (ref 3.5–5.1)
Sodium: 136 mmol/L (ref 135–145)
Total Bilirubin: 0.7 mg/dL (ref 0.0–1.2)
Total Protein: 7.3 g/dL (ref 6.5–8.1)

## 2023-11-11 LAB — RESP PANEL BY RT-PCR (RSV, FLU A&B, COVID)  RVPGX2
Influenza A by PCR: NEGATIVE
Influenza B by PCR: NEGATIVE
Resp Syncytial Virus by PCR: NEGATIVE
SARS Coronavirus 2 by RT PCR: NEGATIVE

## 2023-11-11 LAB — LIPASE, BLOOD: Lipase: 15 U/L (ref 11–51)

## 2023-11-11 LAB — PRO BRAIN NATRIURETIC PEPTIDE: Pro Brain Natriuretic Peptide: 195 pg/mL (ref <300.0)

## 2023-11-11 LAB — MAGNESIUM: Magnesium: 1.8 mg/dL (ref 1.7–2.4)

## 2023-11-11 MED ORDER — HYDROMORPHONE HCL 1 MG/ML IJ SOLN
1.0000 mg | Freq: Once | INTRAMUSCULAR | Status: AC
  Administered 2023-11-11: 1 mg via INTRAVENOUS
  Filled 2023-11-11: qty 1

## 2023-11-11 MED ORDER — AMOXICILLIN 500 MG PO CAPS: 500.0000 mg | ORAL_CAPSULE | Freq: Two times a day (BID) | ORAL | 0 refills | Status: DC

## 2023-11-11 MED ORDER — IOHEXOL 300 MG/ML  SOLN
100.0000 mL | Freq: Once | INTRAMUSCULAR | Status: AC | PRN
  Administered 2023-11-11: 100 mL via INTRAVENOUS

## 2023-11-11 NOTE — ED Notes (Signed)
 Pt was disgruntled due to transportation home not being provided. Pt was encouraged to utilize Uber/Lyft, call a friend, and was offered a bus pass. Pt declined bus pass and stated I have it handled. Pt was wheeled out of room to waiting area by tech.

## 2023-11-11 NOTE — Discharge Instructions (Signed)
 If you develop recurrent, continued, or worsening chest pain, shortness of breath, fever, vomiting, abdominal or back pain, or any other new/concerning symptoms then return to the ER for evaluation.

## 2023-11-11 NOTE — ED Notes (Signed)
 Discharge instructions, medications, and follow up care reviewed with and provided to pt. Pt denies any further questions, and has verbalized understanding.

## 2023-11-11 NOTE — ED Triage Notes (Signed)
 Pt arrives via EMS from home.   Chest pain that began yesterday. Pt states that she has not felt good for 1 week. Pt also states that she felt herself go into Afib today, and took Flecanide to convert.   4mg  Zofran , 324mg  ASA given by EMS.

## 2023-11-11 NOTE — ED Provider Notes (Signed)
 [place] EMERGENCY DEPARTMENT AT [facility] Provider Note   CSN: [id number] Arrival date & time: 11/11/23  9160     Patient presents with: Chest Pain   Alyssa Blanchard is a 53 y.o. female.   HPI 53 year old female presents with a chief complaint of chest pain. Patient has multiple comorbidities including A-fib, diabetes, CHF, asthma, and previous sleeve gastrectomy.  For about a week she has been having some dizziness and lightheadedness and overall fatigue and not feeling well.  There has been nothing else specific such as dysuria, cough.  She has had about 2 weeks of worsening bilateral leg swelling and shortness of breath that she associates with too much fluid from CHF.  She has been compliant with her medicines including her spironolactone  and Bumex .  She feels like she has had a low-grade temperature over the last couple days and knows that she has probable dental infection.  She has diffuse dental issues but is feeling like they are acutely infected, particularly the left maxillary molar.  No facial swelling.  This morning around 5:30 AM she developed acute chest tightness on the left side and it was a sharp pain running down her left arm.  After the chest pain started she felt herself going to A-fib and she took flecainide  which made it resolved but the chest pain did not go away.  She felt sweaty and clammy and had nausea.  No current abdominal pain though she did feel like the right upper abdomen was hurting transiently.  She did vomit up some crackers she tried to swallow.  After EMS arrived, they gave her aspirin  and at this point her chest tightness is minimal to gone and there is no pain in her arm.  There was no radiation of the pain to her abdomen or to her back.  Earlier in the week she had some significant RUQ pain that resolved.   Prior to Admission medications   Medication Sig Start Date End Date Taking? Authorizing Provider  amoxicillin  (AMOXIL ) 500 MG  capsule Take 1 capsule (500 mg total) by mouth 2 (two) times daily for 7 days. 11/11/23 11/18/23 Yes Freddi Hamilton, MD  acetaminophen  (TYLENOL ) 325 MG tablet Take 2 tablets (650 mg total) by mouth every 6 (six) hours as needed for mild pain (pain score 1-3) (or Fever >/= 101). 05/10/23   Angiulli, Toribio PARAS, PA-C  albuterol  (PROVENTIL ) (2.5 MG/3ML) 0.083% nebulizer solution Take 3 mLs by nebulization every 4 (four) hours as needed for wheezing or shortness of breath. 05/12/23   Angiulli, Toribio PARAS, PA-C  apixaban  (ELIQUIS ) 5 MG TABS tablet Take 1 tablet (5 mg total) by mouth 2 (two) times daily. 05/11/23 06/15/24  Angiulli, Toribio PARAS, PA-C  bumetanide  (BUMEX ) 1 MG tablet Take 3 tablets (3 mg total) by mouth 2 (two) times daily. Patient taking differently: Take 3 mg by mouth daily. 08/19/23   Colletta Manuelita Garre, PA-C  cetirizine  (ZYRTEC ) 10 MG tablet Take 1 tablet (10 mg total) by mouth daily. 05/11/23   Angiulli, Toribio PARAS, PA-C  diclofenac  Sodium (VOLTAREN  ARTHRITIS PAIN) 1 % GEL Apply 2 g topically 2 (two) times daily. 05/11/23   Angiulli, Toribio PARAS, PA-C  docusate sodium  (COLACE) 100 MG capsule Take 200 mg by mouth as needed for mild constipation.    [provider]  Dupilumab  (DUPIXENT ) 300 MG/2ML SOPN Inject 300 mg into the skin every 14 (fourteen) days.    [provider]  flecainide  (TAMBOCOR ) 100 MG tablet Take 1  tablet by mouth twice daily ONLY AS NEEDED - max 2 tablets in a 24 hour period 08/24/23   Mealor, Augustus E, MD  Lifitegrast  (XIIDRA ) 5 % SOLN Place 1 drop into both eyes in the morning and at bedtime.    [provider]  magnesium  oxide (MAG-OX) 400 MG tablet Take 1 tablet (400 mg total) by mouth daily. 05/11/23   Angiulli, Toribio PARAS, PA-C  melatonin 5 MG TABS Take 2 tablets (10 mg total) by mouth at bedtime. 05/11/23   Angiulli, Toribio PARAS, PA-C  metolazone  (ZAROXOLYN ) 5 MG tablet Take 1 tablet (5 mg total) by mouth 3 (three) times a week. Take an extra 40mEq of potassium with  Metolazone  06/17/23   Zenaida Morene PARAS, MD  metoprolol  succinate (TOPROL -XL) 25 MG 24 hr tablet Take 2 tablets (50 mg total) by mouth daily. 06/16/23   Zenaida Morene PARAS, MD  Multiple Vitamins-Minerals (BARIATRIC MULTIVITAMINS/IRON  PO) Take 1 tablet by mouth daily with breakfast.    [provider]  pantoprazole  (PROTONIX ) 20 MG tablet Take 2 tablets (40 mg total) by mouth 2 (two) times daily. 05/11/23   Angiulli, Toribio PARAS, PA-C  polyethylene glycol (MIRALAX  / GLYCOLAX ) 17 g packet Take 17 g by mouth as needed.    [provider]  potassium chloride  SA (KLOR-CON  M) 20 MEQ tablet Take 4 tablets (80 mEq total) by mouth every morning AND 2 tablets (40 mEq total) every evening. 08/31/23   Milford, Harlene HERO, FNP  pregabalin  (LYRICA ) 300 MG capsule Take 300 mg by mouth 2 (two) times daily.    [provider]  silver  sulfADIAZINE  (SILVADENE ) 1 % cream Apply topically daily. Apply to right side 05/11/23   Angiulli, Toribio PARAS, PA-C  spironolactone  (ALDACTONE ) 25 MG tablet Take 2 tablets (50 mg total) by mouth daily. 06/16/23   Zenaida Morene PARAS, MD  ZEPBOUND 2.5 MG/0.5ML Pen Inject 2.5 mg into the skin once a week. 08/20/23   [provider]  DULERA  200-5 MCG/ACT AERO Inhale 2 puffs into the lungs in the morning and at bedtime. 05/22/23 08/31/23      Allergies: Gabapentin, Topiramate , Albuterol , Hydroxyzine , Imitrex  [sumatriptan ], Latex, Metformin, Montelukast , Other, Prozac [fluoxetine], Semaglutide , Trazodone  and nefazodone, Advair diskus [fluticasone -salmeterol], and Copper-containing compounds    Review of Systems  Constitutional:  Positive for fever.  HENT:  Positive for dental problem.   Respiratory:  Positive for shortness of breath. Negative for cough.   Cardiovascular:  Positive for chest pain, palpitations and leg swelling.  Gastrointestinal:  Positive for abdominal pain, nausea and vomiting.  Musculoskeletal:  Negative for back pain.  Neurological:  Positive for  light-headedness.    Updated Vital Signs BP (!) 141/70   Pulse 83   Temp 97.9 F (36.6 C) (Oral)   Resp 17   Ht 5' 5 (1.651 m)   Wt (!) 181.4 kg   LMP 12/20/2017   SpO2 95%   BMI 66.56 kg/m   Physical Exam Vitals and nursing note reviewed.  Constitutional:      General: She is not in acute distress.    Appearance: She is well-developed. She is obese. She is not ill-appearing or diaphoretic.  HENT:     Head: Normocephalic and atraumatic.     Mouth/Throat:     Comments: Multiple missing teeth and there are caries present in multiple teeth that are remaining.  There is no obvious dental abscess or gingival swelling.  No oral swelling in general. Cardiovascular:     Rate and Rhythm:  Normal rate and regular rhythm.     Pulses:          Radial pulses are 2+ on the right side and 2+ on the left side.     Heart sounds: Normal heart sounds.  Pulmonary:     Effort: Pulmonary effort is normal.     Breath sounds: Normal breath sounds.  Abdominal:     Palpations: Abdomen is soft.     Tenderness: There is no abdominal tenderness.  Musculoskeletal:     Comments: Chronic appearing lower extremity swelling. No significant pitting noted  Skin:    General: Skin is warm and dry.  Neurological:     Mental Status: She is alert.     (all labs ordered are listed, but only abnormal results are displayed) Labs Reviewed  COMPREHENSIVE METABOLIC PANEL WITH GFR - Abnormal; Notable for the following components:      Result Value   Glucose, Bld 142 (*)    All other components within normal limits  CBC WITH DIFFERENTIAL/PLATELET - Abnormal; Notable for the following components:   Hemoglobin 10.1 (*)    HCT 35.0 (*)    MCH 24.4 (*)    MCHC 28.9 (*)    RDW 17.8 (*)    Platelets 146 (*)    All other components within normal limits  RESP PANEL BY RT-PCR (RSV, FLU A&B, COVID)  RVPGX2  PRO BRAIN NATRIURETIC PEPTIDE  MAGNESIUM   URINALYSIS, ROUTINE W REFLEX MICROSCOPIC  LIPASE, BLOOD   TROPONIN T, HIGH SENSITIVITY  TROPONIN T, HIGH SENSITIVITY    EKG: EKG Interpretation Date/Time:  Wednesday November 11 2023 08:52:39 EDT Ventricular Rate:  76 PR Interval:  129 QRS Duration:  96 QT Interval:  428 QTC Calculation: 482 R Axis:   23  Text Interpretation: Sinus rhythm no acute ST/T changes similar to June 2025 Confirmed by Freddi Hamilton [phone]) on 11/11/2023 8:59:13 AM  Radiology: CT ABDOMEN PELVIS W CONTRAST Result Date: 11/11/2023 CLINICAL DATA:  Acute right-sided abdominal pain. EXAM: CT ABDOMEN AND PELVIS WITH CONTRAST TECHNIQUE: Multidetector CT imaging of the abdomen and pelvis was performed using the standard protocol following bolus administration of intravenous contrast. RADIATION DOSE REDUCTION: This exam was performed according to the departmental dose-optimization program which includes automated exposure control, adjustment of the mA and/or kV according to patient size and/or use of iterative reconstruction technique. CONTRAST:  OMNIPAQUE  IOHEXOL  300 MG/ML  SOLN COMPARISON:  None Available. FINDINGS: Lower chest: No acute abnormality. Hepatobiliary: No cholelithiasis or biliary dilatation. Hepatic steatosis. Hepatomegaly. Pancreas: Unremarkable. No pancreatic ductal dilatation or surrounding inflammatory changes. Spleen: Mild splenomegaly. Adrenals/Urinary Tract: Adrenal glands are unremarkable. Kidneys are normal, without renal calculi, focal lesion, or hydronephrosis. Bladder is unremarkable. Stomach/Bowel: Status post gastric sleeve procedure. No evidence of bowel obstruction or inflammation. The appendix is unremarkable. Vascular/Lymphatic: No significant vascular findings are present. No enlarged abdominal or pelvic lymph nodes. Reproductive: Uterus and bilateral adnexa are unremarkable. Other: No abdominal wall hernia or abnormality. No abdominopelvic ascites. Musculoskeletal: No acute or significant osseous findings. IMPRESSION: 1. Hepatomegaly with hepatic  steatosis. 2. Mild splenomegaly. 3. No acute abnormality seen in the abdomen or pelvis. Electronically Signed   By: Lynwood Landy Raddle M.D.   On: 11/11/2023 14:48   US  Abdomen Limited RUQ (LIVER/GB) Result Date: 11/11/2023 CLINICAL DATA:  Right upper quadrant abdominal pain. EXAM: ULTRASOUND ABDOMEN LIMITED RIGHT UPPER QUADRANT COMPARISON:  September 25, 2014. FINDINGS: Gallbladder: No gallstones or wall thickening visualized. No sonographic Murphy sign noted by sonographer. Common  bile duct: Not visualized.  Exam limited due to body habitus. Liver: No focal lesion identified. Increased echogenicity of hepatic parenchyma is noted suggesting hepatic steatosis. Portal vein is patent on color Doppler imaging with normal direction of blood flow towards the liver. Other: None. IMPRESSION: Exam is limited due to body habitus. Probable hepatic steatosis. No other definite abnormality seen in the right upper quadrant of the abdomen. Common bile duct not visualized. Electronically Signed   By: Lynwood Landy Raddle M.D.   On: 11/11/2023 10:54   DG Chest 2 View Result Date: 11/11/2023 CLINICAL DATA:  Shortness of breath, chest pain, CHF. Atrial fibrillation. EXAM: CHEST - 2 VIEW COMPARISON:  08/18/2023 and CT chest 01/20/2023. FINDINGS: Image quality is degraded by body habitus. Trachea is midline. Heart is enlarged, stable. Lungs are low in volume with probable mild central pulmonary vascular congestion. No airspace consolidation or pleural fluid. IMPRESSION: No acute findings. Electronically Signed   By: Newell Eke M.D.   On: 11/11/2023 09:35     Procedures   Medications Ordered in the ED  iohexol  (OMNIPAQUE ) 300 MG/ML solution 100 mL (100 mLs Intravenous Contrast Given 11/11/23 1155)  HYDROmorphone  (DILAUDID ) injection 1 mg (1 mg Intravenous Given 11/11/23 1252)                                    Medical Decision Making Amount and/or Complexity of Data Reviewed Labs: ordered.    Details: Normal troponin x  2 Radiology: ordered and independent interpretation performed.    Details: No CHF ECG/medicine tests: ordered and independent interpretation performed.    Details: No ischemia  Risk Prescription drug management.   Patient presents with multiple symptoms.  From a chest pain perspective, ECG and troponins are negative.  Low suspicion for PE based on presentation.  X-ray is unremarkable.  She did have some right upper quadrant pain though and ultrasound is unremarkable.  However with her previous surgical history a CT was obtained.  She required a dose of pain meds because laying flat exacerbate her chronic neck pain.  Otherwise the CT is relatively benign, we discussed the hepatomegaly and splenomegaly.  Unclear why she is having the chest pain but I have a low suspicion this is ACS.  At this point she is feeling better for discharge.  She has been having low-grade temperatures as well as dental pain and diffusely poor dentition.  Will cover with antibiotics and she is due to see her dentist later this month.  Will discharge home with return precautions.     Final diagnoses:  Nonspecific chest pain  Dental caries    ED Discharge Orders          Ordered    amoxicillin  (AMOXIL ) 500 MG capsule  2 times daily        11/11/23 1507               Freddi Hamilton, MD 11/11/23 [phone]

## 2023-11-15 ENCOUNTER — Other Ambulatory Visit (HOSPITAL_COMMUNITY): Payer: Self-pay | Admitting: Physician Assistant

## 2023-11-30 ENCOUNTER — Ambulatory Visit (HOSPITAL_BASED_OUTPATIENT_CLINIC_OR_DEPARTMENT_OTHER): Admitting: Family

## 2023-11-30 ENCOUNTER — Ambulatory Visit (HOSPITAL_COMMUNITY)
Admission: RE | Admit: 2023-11-30 | Discharge: 2023-11-30 | Disposition: A | Discharge status: Home / Self Care | Source: Ambulatory Visit | Attending: Cardiology | Admitting: Cardiology

## 2023-11-30 VITALS — BP 120/78 | HR 72 | Wt 394.0 lb

## 2023-11-30 DIAGNOSIS — Z6841 Body Mass Index (BMI) 40.0 and over, adult: Secondary | ICD-10-CM | POA: Diagnosis not present

## 2023-11-30 DIAGNOSIS — Z79899 Other long term (current) drug therapy: Secondary | ICD-10-CM | POA: Diagnosis not present

## 2023-11-30 DIAGNOSIS — R5381 Other malaise: Secondary | ICD-10-CM

## 2023-11-30 DIAGNOSIS — Z9884 Bariatric surgery status: Secondary | ICD-10-CM | POA: Insufficient documentation

## 2023-11-30 DIAGNOSIS — I480 Paroxysmal atrial fibrillation: Secondary | ICD-10-CM | POA: Diagnosis not present

## 2023-11-30 DIAGNOSIS — G4733 Obstructive sleep apnea (adult) (pediatric): Secondary | ICD-10-CM | POA: Insufficient documentation

## 2023-11-30 DIAGNOSIS — I110 Hypertensive heart disease with heart failure: Secondary | ICD-10-CM | POA: Insufficient documentation

## 2023-11-30 DIAGNOSIS — I5032 Chronic diastolic (congestive) heart failure: Secondary | ICD-10-CM | POA: Diagnosis not present

## 2023-11-30 DIAGNOSIS — I5023 Acute on chronic systolic (congestive) heart failure: Secondary | ICD-10-CM | POA: Diagnosis present

## 2023-11-30 MED ORDER — LOSARTAN POTASSIUM 25 MG PO TABS: 25.0000 mg | ORAL_TABLET | Freq: Every day | ORAL | 3 refills | Status: DC

## 2023-11-30 NOTE — Telephone Encounter (Signed)
 Will refill at today's OV (11/30/23) if needed.

## 2023-11-30 NOTE — Progress Notes (Signed)
   ADVANCED HEART FAILURE FOLLOW UP CLINIC NOTE  Referring Physician: Nikki Rams, Aliene, MD  Primary Care: Nikki Rams, Aliene, MD Primary Cardiologist:  HPI: Alyssa Blanchard is a 53 y.o. female who presents for follow up of .      Admitted to [facility] in 1/25 with acute on chronic HFpEF and RLE cellulitis.   She was readmitted to [facility] in 2/25 with volume overload, Afib with RVR. Echo with EF 70-75%, RV okay, RVSP 39 mmHg. Diuresed with IV lasix . Later underwent TEE/DCCV to SR. She was also treated for norovirus, RLE cellulitis, E. Coli UTI then candida vaginosis. Discharged to CIR, weight 392 lbs.   She saw [facility] AHF/transplant team for consult 05/29/23.   Has had difficulty with volume overload in clinic at multiple visits, as well as Afib with RVR. Last visit was taking bumex  3mg  daily, weight down after she had started Zepbound.        SUBJECTIVE:  Patient reports that she has overall been doing well, but is having some increasing palpitations that she attributes to atrial fibrillation recently. She reports that her lower extremity swelling is overall improved, still has some around her upper thighs. She has been sleeping well, no issues with her medications. Reports some increasing blood pressures at home.   PMH, current medications, allergies, social history, and family history reviewed in epic.  PHYSICAL EXAM: Vitals:   11/30/23 1028  BP: 120/78  Pulse: 72  SpO2: 97%   GENERAL: Well nourished and in no apparent distress at rest.  PULM:  Normal work of breathing, clear to auscultation bilaterally. Respirations are unlabored.  CARDIAC:  JVP: ***         Normal rate with regular rhythm. No murmurs, rubs or gallops.  *** edema. Warm and well perfused extremities. ABDOMEN: Soft, non-tender, non-distended. NEUROLOGIC: Patient is oriented x3 with no focal or lateralizing neurologic deficits.    DATA REVIEW  ECG: ***    ECHO: ***   CATH: ***     ASSESSMENT &  PLAN:  Chronic diastolic heart failure: Multiple readmissions, volume status obviously difficult given body habitus. Suspect largely due to morbid obesity and untreated sleep apnea. - NYHA III. Body habitus contributing. Weight down with addition on GLP1, does not appear markedly volume overloaded. - Continue Bumex  3 mg daily (orthostatic on 3 mg bid) + metolazone  5 mg/extra 40 KCL 3x/week. - Continue spironolactone  50 mg daily. - Continue Toprol  XL 50 mg daily. - Not a Zoll HFMS nor Cardiomems candidate given insurance - Labs today.   Morbid obesity: Previous gastric sleeve. - Body mass index is 62.5 kg/m. - Now on GLP-1, down 25 lbs in past 2 weeks   PAF: Previously on tikosyn , s/p DCCV 2/25.  - 2 day holter monitor (3/25, [facility]):  mostly NSR, frequent short runs of SVT, rare PVCs and PACs - Now established with [facility], and plugged into AF clinic - Regular on exam today. - Continue Eliquis  5 mg bid, no bleeding issues - Continue beta blocker - She has flecainide  for PRN use   HTN: BP stable - Continue current meds -    OSA:  - Sleep study at [facility] in 05/25 with moderate OSA - Has CPAP, planning on starting soon  Follow up in ***  Alyssa Brownie, MD Advanced Heart Failure Mechanical Circulatory Support 11/30/23

## 2023-11-30 NOTE — Telephone Encounter (Signed)
 LMTCB

## 2023-11-30 NOTE — Patient Instructions (Signed)
 Start Losartan  25 mg daily.  Blood work in 2 weeks.  Your physician recommends that you schedule a follow-up appointment in: 2 months.  If you have any questions or concerns before your next appointment please send us  a message through [place] or call our office at [phone].    TO LEAVE A MESSAGE FOR THE NURSE SELECT OPTION 2, PLEASE LEAVE A MESSAGE INCLUDING: YOUR NAME DATE OF BIRTH CALL BACK NUMBER REASON FOR CALL**this is important as we prioritize the call backs  YOU WILL RECEIVE A CALL BACK THE SAME DAY AS LONG AS YOU CALL BEFORE 4:00 PM  At the [facility], you and your health needs are our priority. As part of our continuing mission to provide you with exceptional heart care, we have created designated Provider Care Teams. These Care Teams include your primary Cardiologist (physician) and Advanced Practice Providers (APPs- Physician Assistants and Nurse Practitioners) who all work together to provide you with the care you need, when you need it.   You may see any of the following providers on your designated Care Team at your next follow up: Dr Toribio Fuel Dr Ezra Shuck Dr. Ria Commander Dr. Morene Brownie Amy Lenetta, NP Caffie Shed, [place] [facility], [place] Beckey Coe, NP [place] Lee, NP Ellouise Class, NP Tinnie Redman, PharmD Jaun Bash, PharmD   Please be sure to bring in all your medications bottles to every appointment.    Thank you for choosing [facility]

## 2023-11-30 NOTE — Telephone Encounter (Signed)
 Copied from CRM #[id number]. Topic: Schedule Appointment - Schedule Patient >> Nov 30, 2023  9:04 AM Laray DEL wrote: Blanchard, Alyssa Italiano is calling other request    Include all details related to the request(s) below:  Pcp did not have any available appointments until Jan 2026. Patient demaned to have an appointment this year I explained to her that there was nothing sooner and that he asked to have a PE done by another provider, and I explained that it had to be done wit her pcp. She got upset and hung up   Confirm and type the Best Contact Number below:  Patient/caller contact number:             [] Home  [] Mobile  [] Work [] Other   [] Okay to leave a voicemail   Medication List:  Current Outpatient Medications:  .  acetaminophen  (TYLENOL ) 500 mg tablet, Take 1,000 mg by mouth 2 (two) times a day., Disp: , Rfl:  .  albuterol  2.5 mg /3 mL (0.083 %) nebulizer solution, Inhale 3 mL every 4 (four) hours as needed for wheezing or shortness of breath., Disp: , Rfl:  .  budesonide -glycopyr-formoterol  (Breztri Aerosphere) 160-9-4.8 mcg/actuation inhaler, Inhale 2 puffs 2 (two) times a day., Disp: 1 each, Rfl: 0 .  bumetanide  (BUMEX ) 1 mg tablet, Take 2 tablets (2 mg total) by mouth daily., Disp: 180 tablet, Rfl: 0 .  cetirizine  (ZyrTEC ) 10 mg tablet, Take 10 mg by mouth daily., Disp: , Rfl:  .  clindamycin (CLEOCIN T) 1 % lotion, Apply a thin layer to the affected areas on the breasts 1-2 times daily, Disp: 60 mL, Rfl: 5 .  diclofenac  sodium (VOLTAREN ) 1 % gel, Apply 1 g topically 2 (two) times a day., Disp: , Rfl:  .  dilTIAZem  (CARDIZEM  CD) 180 mg 24 hr capsule, Take 1 capsule (180 mg total) by mouth daily., Disp: 90 capsule, Rfl: 3 .  Dulera  200-5 mcg/actuation HFAA inhaler, Inhale 2 puffs 2 (two) times a day., Disp: 13 g, Rfl: 0 .  dupilumab  (DUPIXENT ) 300 mg/2 mL pnij, Inject 2 mLs (300 mg total) into the skin every 14 days., Disp: 4 mL, Rfl: 11 .  Eliquis  5 mg tab, TAKE 1 TABLET BY MOUTH TWICE A  DAY, Disp: 90 tablet, Rfl: 3 .  glucose blood (Blood Glucose Test) test strip, Check sugar twice daily, Disp: 200 each, Rfl: 1 .  glucose monitoring kit kit, Use twice daily. (Please provide what insurance covers), Disp: 1 each, Rfl: 0 .  Lancets misc, Check sugar twice daily, Disp: 200 each, Rfl: 1 .  magnesium  oxide 400 mg (241 mg magnesium ) tab, Take 1 tablet (400 mg total) by mouth daily., Disp: 90 tablet, Rfl: 0 .  melatonin 10 mg cap capsule/tablet, Take 10 mg by mouth nightly., Disp: , Rfl:  .  metOLazone  (ZAROXOLYN ) 2.5 mg tablet, Take 5 mg by mouth 3 (three) times a week., Disp: , Rfl:  .  metoprolol  succinate (TOPROL  XL) 25 mg 24 hr tablet, Take 3 tablets (75 mg total) by mouth daily., Disp: 30 tablet, Rfl: 0 .  multivit-min/iron /folic acid/K (BARIATRIC MULTIVITAMINS ORAL), Take 1 capsule by mouth daily., Disp: , Rfl:  .  nystatin  (MYCOSTATIN ) 100,000 unit/gram powder, Apply topically 2 (two) times a day. To folds, Disp: 60 g, Rfl: 11 .  omeprazole (PriLOSEC) 20 mg DR capsule, Take 1 capsule (20 mg total) by mouth 2 (two) times a day., Disp: 60 capsule, Rfl: 3 .  potassium chloride  20 mEq ER tablet,  Take 40 mEq by mouth daily., Disp: , Rfl:  .  pregabalin  (LYRICA ) 200 mg capsule, TAKE 1 CAPSULE BY MOUTH TWICE A DAY, Disp: 180 capsule, Rfl: 0 .  scopolamine  (TRANSDERM-SCOP) 1 mg over 3 days pt3d patch, Apply 1 patch topically every third day as needed for nausea., Disp: , Rfl:  .  silver  sulfADIAZINE  (SILVADENE ) 1 % cream, Apply topically daily., Disp: 400 g, Rfl: 11 .  spironolactone  (ALDACTONE ) 25 mg tablet, Take 2 tablets (50 mg total) by mouth daily., Disp: 30 tablet, Rfl: 0 .  tirzepatide, weight loss, (Zepbound) 10 mg/0.5 mL subcutaneous pen injector, Inject 0.5 mL (10 mg total) under the skin every 7 days Indications: sleep apnea due to airway obstruction., Disp: 2 mL, Rfl: 1 .  Xiidra  5 % dpet ophthalmic solution, Administer 1 drop into both eyes 2 (two) times a day., Disp: , Rfl:    Current Facility-Administered Medications:  .  aspirin  tablet 325 mg, 325 mg, oral, Once, Ronal Comer Corona, PA-C .  nitroglycerin (NITROSTAT) SL tablet 0.4 mg, 0.4 mg, sublingual, Once, Ronal Comer Corona, PA-C     Medication Request/Refills: Pharmacy Information (if applicable)   [] Not Applicable       []  Pharmacy listed  Send Medication Request to:                                                 [] Pharmacy not listed (added to pharmacy list in Epic) Send Medication Request to:      Listed Pharmacies: [id number] - [place], [place] - [address] - PHONE: [phone] [place]: [phone] [facility] [place] - [facility] - [place], [place] - [address] AT [address] - PHONE: [phone] - FAX: [phone]

## 2023-12-01 ENCOUNTER — Encounter (HOSPITAL_BASED_OUTPATIENT_CLINIC_OR_DEPARTMENT_OTHER): Payer: Self-pay | Admitting: Family

## 2023-12-01 ENCOUNTER — Telehealth (HOSPITAL_COMMUNITY): Payer: Self-pay | Admitting: Licensed Clinical Social Worker

## 2023-12-01 ENCOUNTER — Ambulatory Visit (INDEPENDENT_AMBULATORY_CARE_PROVIDER_SITE_OTHER): Admitting: Family

## 2023-12-01 ENCOUNTER — Other Ambulatory Visit (HOSPITAL_BASED_OUTPATIENT_CLINIC_OR_DEPARTMENT_OTHER): Payer: Self-pay

## 2023-12-01 VITALS — BP 133/47 | HR 72 | Resp 17 | Ht 65.0 in | Wt 393.0 lb

## 2023-12-01 DIAGNOSIS — R5381 Other malaise: Secondary | ICD-10-CM | POA: Diagnosis not present

## 2023-12-01 DIAGNOSIS — G4733 Obstructive sleep apnea (adult) (pediatric): Secondary | ICD-10-CM | POA: Diagnosis not present

## 2023-12-01 DIAGNOSIS — D6859 Other primary thrombophilia: Secondary | ICD-10-CM

## 2023-12-01 DIAGNOSIS — I5032 Chronic diastolic (congestive) heart failure: Secondary | ICD-10-CM

## 2023-12-01 DIAGNOSIS — I480 Paroxysmal atrial fibrillation: Secondary | ICD-10-CM

## 2023-12-01 MED ORDER — LOSARTAN POTASSIUM 25 MG PO TABS
25.0000 mg | ORAL_TABLET | Freq: Every day | ORAL | 3 refills | Status: DC
  Filled 2023-12-01 (×3): qty 90, 90d supply, fill #0

## 2023-12-01 NOTE — Progress Notes (Signed)
 Cardiology Office Note:  .   Date:  12/01/2023  ID:  Alyssa Blanchard, DOB Jan 20, 1971, MRN [id number] PCP: Nikki Rams, Aliene, MD  [facility] Providers Cardiologist:  Annabella Scarce, MD Electrophysiologist:  Eulas FORBES Furbish, MD    History of Present Illness: Alyssa   Alyssa Blanchard is a 53 y.o. female with hx of obesity s/p gastric bypass, CHF, OSA/obesity hypoventilation with pulmonary hypertension, atrial fibrillation on Eliquis , DM 2  Admitted 03/2023 at [facility] with CHF exacerbation and diuresed.  2/13-2/20 Admission to [facility] IV diuresis 11.8 L and DCCV on 04/28/23. Her Propranolol  was switched to Metprolol. She was discharged to CIR.   ED visit 05/24/23 and 05/25/23 with chest pain felt to be related to pinched nerve. Has MRI upcoming for further evaluation. Pain was worse with a deep breath and associated with exertional dyspnea.  Troponin x2, CXR, BNP unremarkable. Pain was reproducible with palpation, twisting.   Seen by [facility] 05/29/23. Approximate dry weight 384 to 386 pounds. 3 day monitor ordered, not yet started. Of note only 3 days due to adhesive intolerance. She has seen Dr. Shellia of [facility] 05/27/23 and home sleep study ordered.  She has since followed with Advanced Heart Failure team. At visit with their team yesterday Losartan  25mg  daily was added.   Presents today for follow up. She has elected to move her cardiology care to [facility] and no longer plans to follow with Dr. Waymond. Plans to follow with Dr. Scarce as general cardiology, Dr. Zenaida in Heart Failure clinic, and Dr. [facility] for EP.    Reports more recent bouts with atrial fibrillation. No clear triggers. She has PRN Flecainide  which dose stop the episdoes. Episodes occur twice per month and most often resolve with one Flecainide . Interested in re-consideration of Tikosyn  or alternate plan for management of her atrial fibrillation. Notes headache, upset stomach  with Zepbound managed by Dr. Shellia. She is considering water  aerobics - she was unable to complete previously but has increased stamina to walk to pool and hopeful to start.  She has not yet picked up Losartan  due to cost concerns, will send to [facility] pharmacy. Reports food insecurity, will refer to SW. She is concerned about loose skin/swelling behind her bilateral legs.   Prior diabetes medications Semaglutide  (stomach side effects) Trulicity (ineffective Glipizide (caused her to pass out) Victoza (stomach side effects ) Jardiance (yeast infection)  ROS: Please see the history of present illness.    All other systems reviewed and are negative.   Studies Reviewed: .        Cardiac Studies & Procedures   ______________________________________________________________________________________________     ECHOCARDIOGRAM  ECHOCARDIOGRAM COMPLETE 04/24/2023  Narrative ECHOCARDIOGRAM REPORT    Patient Name:   Alyssa Blanchard Date of Exam: 04/24/2023 Medical Rec #:  [id number]         Height:       65.0 in Accession #:    [id number]        Weight:       442.9 lb Date of Birth:  08/30/1970         BSA:          2.773 m Patient Age:    53 years          BP:           116/58 mmHg Patient Gender: F  HR:           99 bpm. Exam Location:  Inpatient  Procedure: 2D Echo (Both Spectral and Color Flow Doppler were utilized during procedure).  Indications:    congestive heart failure  History:        Patient has prior history of Echocardiogram examinations, most recent 08/05/2019. Arrythmias:Atrial Fibrillation; Risk Factors:Diabetes.  Sonographer:    Tinnie Barefoot RDCS Referring Phys: [id number] SOYLA A ACHARYA  IMPRESSIONS   1. Left ventricular ejection fraction, by estimation, is 70 to 75%. The left ventricle has hyperdynamic function. The left ventricle has no regional wall motion abnormalities. There is mild left ventricular hypertrophy. Left ventricular  diastolic parameters were normal (medial e' 9.7 cm/s). 2. Right ventricular systolic function is normal. The right ventricular size is normal. There is mildly elevated pulmonary artery systolic pressure. The estimated right ventricular systolic pressure is 38.5 mmHg. 3. The mitral valve is grossly normal. Trivial mitral valve regurgitation. No evidence of mitral stenosis. 4. The aortic valve was not well visualized. There is mild calcification of the aortic valve. Aortic valve regurgitation is not visualized. No aortic stenosis is present. 5. The inferior vena cava is dilated in size with >50% respiratory variability, suggesting right atrial pressure of 8 mmHg. 6. Increased flow velocities may be secondary to anemia, thyrotoxicosis, hyperdynamic or high flow state.  FINDINGS Left Ventricle: Left ventricular ejection fraction, by estimation, is 70 to 75%. The left ventricle has hyperdynamic function. The left ventricle has no regional wall motion abnormalities. The left ventricular internal cavity size was normal in size. There is mild left ventricular hypertrophy. Left ventricular diastolic parameters were normal.  Right Ventricle: The right ventricular size is normal. No increase in right ventricular wall thickness. Right ventricular systolic function is normal. There is mildly elevated pulmonary artery systolic pressure. The tricuspid regurgitant velocity is 2.76 m/s, and with an assumed right atrial pressure of 8 mmHg, the estimated right ventricular systolic pressure is 38.5 mmHg.  Left Atrium: Left atrial size was normal in size.  Right Atrium: Right atrial size was normal in size.  Pericardium: There is no evidence of pericardial effusion.  Mitral Valve: The mitral valve is grossly normal. Trivial mitral valve regurgitation. No evidence of mitral valve stenosis.  Tricuspid Valve: The tricuspid valve is normal in structure. Tricuspid valve regurgitation is trivial. No evidence of  tricuspid stenosis.  Aortic Valve: The aortic valve was not well visualized. There is mild calcification of the aortic valve. Aortic valve regurgitation is not visualized. No aortic stenosis is present. Aortic valve mean gradient measures 15.0 mmHg. Aortic valve peak gradient measures 27.4 mmHg. Aortic valve area, by VTI measures 2.19 cm.  Pulmonic Valve: The pulmonic valve was not well visualized. Pulmonic valve regurgitation is trivial. No evidence of pulmonic stenosis.  Aorta: The aortic root is normal in size and structure.  Venous: The inferior vena cava is dilated in size with greater than 50% respiratory variability, suggesting right atrial pressure of 8 mmHg.  IAS/Shunts: The interatrial septum was not well visualized.   LEFT VENTRICLE PLAX 2D LVIDd:         4.90 cm   Diastology LVIDs:         3.40 cm   LV e' medial:    9.68 cm/s LV PW:         1.20 cm   LV E/e' medial:  14.4 LV IVS:        1.10 cm   LV e' lateral:   15.00  cm/s LVOT diam:     1.80 cm   LV E/e' lateral: 9.3 LV SV:         91 LV SV Index:   33 LVOT Area:     2.54 cm   RIGHT VENTRICLE             IVC RV Basal diam:  2.80 cm     IVC diam: 2.20 cm RV S prime:     17.50 cm/s TAPSE (M-mode): 3.0 cm  LEFT ATRIUM             Index        RIGHT ATRIUM           Index LA diam:        4.20 cm 1.51 cm/m   RA Area:     15.50 cm LA Vol (A2C):   42.7 ml 15.40 ml/m  RA Volume:   37.40 ml  13.49 ml/m LA Vol (A4C):   70.8 ml 25.53 ml/m LA Biplane Vol: 56.2 ml 20.26 ml/m AORTIC VALVE AV Area (Vmax):    2.05 cm AV Area (Vmean):   2.12 cm AV Area (VTI):     2.19 cm AV Vmax:           261.50 cm/s AV Vmean:          181.000 cm/s AV VTI:            0.417 m AV Peak Grad:      27.4 mmHg AV Mean Grad:      15.0 mmHg LVOT Vmax:         211.00 cm/s LVOT Vmean:        150.500 cm/s LVOT VTI:          0.359 m LVOT/AV VTI ratio: 0.86  AORTA Ao Root diam: 3.00 cm Ao Asc diam:  3.70 cm  MITRAL VALVE                 TRICUSPID VALVE MV Area (PHT): 4.60 cm     TR Peak grad:   30.5 mmHg MV Decel Time: 165 msec     TR Vmax:        276.00 cm/s MV E velocity: 139.00 cm/s MV A velocity: 117.00 cm/s  SHUNTS MV E/A ratio:  1.19         Systemic VTI:  0.36 m Systemic Diam: 1.80 cm  Soyla Merck MD Electronically signed by Soyla Merck MD Signature Date/Time: 04/24/2023/6:20:08 PM    Final   TEE  ECHO TEE 08/05/2019  Narrative TRANSESOPHOGEAL ECHO REPORT    Patient Name:   Pier Wire Date of Exam: 08/05/2019 Medical Rec #:  [id number]   Height:       65.0 in Accession #:    [id number]  Weight:       388.0 lb Date of Birth:  1970/08/15   BSA:          2.622 m Patient Age:    48 years    BP:           140/104 mmHg Patient Gender: F           HR:           92 bpm. Exam Location:  Inpatient  Procedure: Transesophageal Echo, Color Doppler and Cardiac Doppler  Indications:     atrial fibrillation  History:         Patient has prior history of Echocardiogram examinations, most recent 08/03/2019.  Sonographer:  Tinnie Barefoot Referring Phys:  [id number] [facility] [place] Diagnosing Phys: Aleene Passe MD  PROCEDURE: After discussion of the risks and benefits of a TEE, an informed consent was obtained from the patient. The patient was intubated. The transesophogeal probe was passed without difficulty through the esophogus of the patient. Sedation performed by different physician. The patient developed no complications during the procedure. A direct current cardioversion was performed.  IMPRESSIONS   1. Left ventricular ejection fraction, by estimation, is 30 to 35%. The left ventricle has moderately decreased function. The left ventricle demonstrates global hypokinesis. 2. Right ventricular systolic function is normal. The right ventricular size is normal. 3. No left atrial/left atrial appendage thrombus was detected. 4. The mitral valve is grossly normal. Mild mitral valve regurgitation.  No evidence of mitral stenosis. 5. The aortic valve is grossly normal. Aortic valve regurgitation is trivial. No aortic stenosis is present.  FINDINGS Left Ventricle: Left ventricular ejection fraction, by estimation, is 30 to 35%. The left ventricle has moderately decreased function. The left ventricle demonstrates global hypokinesis. The left ventricular internal cavity size was normal in size. There is no left ventricular hypertrophy.  Right Ventricle: The right ventricular size is normal. No increase in right ventricular wall thickness. Right ventricular systolic function is normal.  Left Atrium: Left atrial size was normal in size. No left atrial/left atrial appendage thrombus was detected.  Right Atrium: Right atrial size was normal in size.  Pericardium: There is no evidence of pericardial effusion.  Mitral Valve: The mitral valve is grossly normal. Mild mitral valve regurgitation. No evidence of mitral valve stenosis.  Tricuspid Valve: The tricuspid valve is normal in structure. Tricuspid valve regurgitation is mild . No evidence of tricuspid stenosis.  Aortic Valve: The aortic valve is grossly normal. Aortic valve regurgitation is trivial. No aortic stenosis is present.  Pulmonic Valve: The pulmonic valve was normal in structure. Pulmonic valve regurgitation is not visualized. No evidence of pulmonic stenosis.  Aorta: The aortic root and ascending aorta are structurally normal, with no evidence of dilitation.  IAS/Shunts: The atrial septum is grossly normal.  Aleene Passe MD Electronically signed by Aleene Passe MD Signature Date/Time: 08/05/2019/3:19:47 PM    Final        ______________________________________________________________________________________________        Risk Assessment/Calculations:    CHA2DS2-VASc Score = 4   This indicates a 4.8% annual risk of stroke. The patient's score is based upon: CHF History: 1 HTN History: 1 Diabetes History:  1 Stroke History: 0 Vascular Disease History: 0 Age Score: 0 Gender Score: 1            Physical Exam:   VS:  BP (!) 133/47 (BP Location: Left Wrist, Patient Position: Sitting, Cuff Size: Normal)   Pulse 72   Resp 17   Ht 5' 5 (1.651 m)   Wt (!) 393 lb (178.3 kg)   LMP 12/20/2017   SpO2 95%   BMI 65.40 kg/m    Wt Readings from Last 3 Encounters:  12/01/23 (!) 393 lb (178.3 kg)  11/30/23 (!) 394 lb (178.7 kg)  11/11/23 (!) 400 lb (181.4 kg)    GEN: Well nourished, overweight, well developed in no acute distress NECK: No JVD; No carotid bruits CARDIAC: RRR, no murmurs, rubs, gallops RESPIRATORY:  Clear to auscultation without rales, wheezing or rhonchi  ABDOMEN: Soft, non-tender, non-distended EXTREMITIES:  No edema; No deformity   ASSESSMENT AND PLAN: .    PAF - s/p cardioversion 04/28/23. CHA2DS2-VASc Score = 4 [  CHF History: 1, HTN History: 1, Diabetes History: 1, Stroke History: 0, Vascular Disease History: 0, Age Score: 0, Gender Score: 1].  Therefore, the patient's annual risk of stroke is 4.8 %.    Continue Eliquis  5mg  BID. Denies bleeding complications. Reports more recent palpitations episode of atrial fib requiring 1-2 PRN Flecainide . Due to more frequent atrial fib will route to AFib clinic to request sooner OV at her request.   Obesity / DM2 - Weight loss via diet and exercise encouraged. Discussed the impact being overweight would have on cardiovascular risk.  Multiple intolerances to prior diabetes medications (semaglutide , Trulicity, glipizide, Victoza, Jardiance). Presently on Zepbound 10mg  daily at direction of Dr. Shellia. Refer to water  PT to increase physical activity.   OSA - CPAP compliance encouraged. Using CPAP, followed by Dr. Shellia.  Chronic diastolic congestive heart failure - Follows with Dr. Zenaida and Advanced [facility] Failure Clinic.  Additional GDMT includes Toprol  75 mg daily, spironolactone  25 mg daily., Losartan  25mg  daily, Bumex  3g daily. SGLT2i  previously deferred due to frequent yeast infection/UTI. Suspect the loose skin and swelling behind her legs is related to lymphedema. Leg elevation encouraged. Will route to Dr. Zenaida to see if he believes she would benefit from VVS referral.  Food insecurity - food bag from [employer] food pantry provided. List of local food resources provided. Referred to SW team for further follow up.         Dispo: follow up as scheduled with Advanced Heart Failure Team and in 6 months with general cardiology. Will reach out to AFib clinic for sooner visit due to her report of increased frequency of arrhythmia.   Signed, Reche GORMAN Finder, NP

## 2023-12-01 NOTE — Telephone Encounter (Signed)
 H&V Care Navigation CSW Progress Note  Clinical Social Worker consulted to speak with pt regarding medication cost concerns and food insecurity.  Pt confirms sometimes it is hard to pay for multiple copays for meds.  Discussed finding pharmacy that waives copays or moving to [facility] and setting up AR account so she can pay as able and still get her medications.  Pt received Heart and Vascular Food Bag today and admits getting food is sometimes a concern.  Lives with her mom and only source of income for the household is moms Tree surgeon.  Patient in process of applying for disability- submitted application 3 months ago.  Patient gets food stamps but not enough to get sufficient food.  CSW mailing list of food pantries and referral for [employer] to patient.  Patient is participating in a Managed Medicaid Plan:  Yes  SDOH Screenings   Food Insecurity: Food Insecurity Present (12/01/2023)  Housing: Low Risk  (10/23/2023)  Transportation Needs: Unmet Transportation Needs (10/23/2023)  Utilities: Low Risk  (08/20/2023)   Received from [facility]  Alcohol Screen: Low Risk  (04/30/2023)  Financial Resource Strain: Low Risk  (10/23/2023)  Physical Activity: Inactive (10/23/2023)  Social Connections: Moderately Isolated (10/23/2023)  Stress: Stress Concern Present (10/23/2023)  Tobacco Use: Medium Risk (12/01/2023)    Andriette HILARIO Leech, LCSW Clinical Social Worker Advanced [facility] Failure Clinic Desk#: [phone] Cell#: [phone]

## 2023-12-01 NOTE — Telephone Encounter (Signed)
 Appointment scheduled for a physical 12/3. Patient agreed and verbalized understanding.

## 2023-12-01 NOTE — Patient Instructions (Addendum)
 Medication Instructions:  Continue your current medications  *If you need a refill on your cardiac medications before your next appointment, please call your pharmacy*  Lab Work: BMET In 2 weeks as recommended by Heart Failure Clinic  Follow-Up: At [facility], you and your health needs are our priority.  As part of our continuing mission to provide you with exceptional heart care, our providers are all part of one team.  This team includes your primary Cardiologist (physician) and Advanced Practice Providers or APPs (Physician Assistants and Nurse Practitioners) who all work together to provide you with the care you need, when you need it.  Your next appointment:   As scheduled with Heart Failure Clinic  We will reach out to [facility] to get your follow up moved sooner  With Dr. Raford or Reche GORMAN Finder, NP in 6 months (if this ends up being too close to a visit with Heart Failure Clinic visit, let us  know and we can push it out)  We recommend signing up for the patient portal called MyChart.  Sign up information is provided on this After Visit Summary.  MyChart is used to connect with patients for Virtual Visits (Telemedicine).  Patients are able to view lab/test results, encounter notes, upcoming appointments, etc.  Non-urgent messages can be sent to your provider as well.   To learn more about what you can do with MyChart, go to [email or web address].

## 2023-12-02 ENCOUNTER — Other Ambulatory Visit (HOSPITAL_BASED_OUTPATIENT_CLINIC_OR_DEPARTMENT_OTHER): Payer: Self-pay

## 2023-12-07 ENCOUNTER — Ambulatory Visit (HOSPITAL_COMMUNITY)
Admission: RE | Admit: 2023-12-07 | Discharge: 2023-12-07 | Disposition: A | Discharge status: Home / Self Care | Source: Ambulatory Visit | Attending: Internal Medicine | Admitting: Internal Medicine

## 2023-12-07 ENCOUNTER — Telehealth: Payer: Self-pay | Admitting: Pharmacist

## 2023-12-07 VITALS — BP 130/76 | HR 71 | Ht 65.0 in | Wt 388.6 lb

## 2023-12-07 DIAGNOSIS — I5032 Chronic diastolic (congestive) heart failure: Secondary | ICD-10-CM | POA: Diagnosis not present

## 2023-12-07 DIAGNOSIS — G4733 Obstructive sleep apnea (adult) (pediatric): Secondary | ICD-10-CM | POA: Diagnosis not present

## 2023-12-07 DIAGNOSIS — I110 Hypertensive heart disease with heart failure: Secondary | ICD-10-CM | POA: Insufficient documentation

## 2023-12-07 DIAGNOSIS — Z6841 Body Mass Index (BMI) 40.0 and over, adult: Secondary | ICD-10-CM | POA: Insufficient documentation

## 2023-12-07 DIAGNOSIS — I4891 Unspecified atrial fibrillation: Secondary | ICD-10-CM

## 2023-12-07 DIAGNOSIS — E669 Obesity, unspecified: Secondary | ICD-10-CM | POA: Diagnosis not present

## 2023-12-07 DIAGNOSIS — I272 Pulmonary hypertension, unspecified: Secondary | ICD-10-CM | POA: Insufficient documentation

## 2023-12-07 DIAGNOSIS — Z7901 Long term (current) use of anticoagulants: Secondary | ICD-10-CM | POA: Insufficient documentation

## 2023-12-07 DIAGNOSIS — D6869 Other thrombophilia: Secondary | ICD-10-CM | POA: Diagnosis not present

## 2023-12-07 DIAGNOSIS — I480 Paroxysmal atrial fibrillation: Secondary | ICD-10-CM | POA: Diagnosis present

## 2023-12-07 DIAGNOSIS — Z79899 Other long term (current) drug therapy: Secondary | ICD-10-CM | POA: Insufficient documentation

## 2023-12-07 DIAGNOSIS — E119 Type 2 diabetes mellitus without complications: Secondary | ICD-10-CM | POA: Insufficient documentation

## 2023-12-07 NOTE — Telephone Encounter (Signed)
 Medication list reviewed in anticipation of upcoming Tikosyn  initiation. Patient is taking bumetanide  and metolazone  that may increase risk of QT prolongation by causing electrolyte derangements with diuresis. Of note, patient is taking a potassium chloride  and magnesium  supplement. Monitor serum potassium and magnesium  closely. Breztri contains formoterol  which has an intermediate risk of QT prolongation. Monitor closely when used with Tikosyn . Patient already advised to hold flecainide  starting 12/24/23 prior to Tikosyn  initiation.  Patient is anticoagulated on apixaban  on the appropriate dose. Please ensure that patient has not missed any anticoagulation doses in the 3 weeks prior to Tikosyn  initiation.   Patient will need to be counseled to avoid use of Benadryl  while on Tikosyn  and in the 2-3 days prior to Tikosyn  initiation.

## 2023-12-07 NOTE — Patient Instructions (Addendum)
 Dofetilide  (Tikosyn ) Hospital Admission You will need to hold Zepbound 7 days prior to admission 12/16/23 STOP flecainide  12/24/23   Preparing for admission:  Check with drug insurance company for cost of drug to ensure affordability --- Price check the generic of Tikosyn  which is Dofetilide  500 mcg twice a day.   GoodRx is an option if insurance copay is unaffordable. Patient assistance is also available.  A pharmacist will review all your medications for potential interactions with Tikosyn . If any medication changes are needed prior to admission we will be in touch with you.  If any new medications are started AFTER your admission date is set with [facility] RN [phone]). Please notify our office immediately so your medication list can be updated and reviewed by our pharmacist again.  Please ensure no missed doses of your anticoagulation (blood thinner) for 3 weeks prior to admission. If a dose is missed please notify our office immediately.  If you are on coumadin /warfarin we will require 4 weekly therapeutic INR checks prior to admission.  No Benadryl  is allowed 3 days prior to admission.   On day of admission:  Afib Clinic office visit will be scheduled on the morning of admission for preliminary labs and EKG.  Please take your morning medications and have a normal breakfast prior to arrival to the appointment.     Tikosyn  initiation requires a 3 night/4 day hospital stay with constant telemetry monitoring. You will have an EKG after each dose of Tikosyn  as well as daily lab draws.  You may bring personal belongings/clothing with you to the hospital. Please leave your suitcase in the car until you arrive in admissions.  If the drug does not convert you to normal rhythm a cardioversion will be performed after the 4th dose of Tikosyn .   If you use a CPAP machine; please bring this with you to the hospital.   Time of admission is dependent on bed availability in the hospital. In some  instances, you will be sent home until bed is available. Rarely admission can be delayed to the following day if hospital census prevents available beds.  Questions please call our office at [phone]    You will need to hold Zepbound 7 days prior to admission

## 2023-12-07 NOTE — Progress Notes (Signed)
 Primary Care Physician: Nikki Rams, Aliene, MD Primary Cardiologist: Annabella Scarce, MD Electrophysiologist: Eulas FORBES Furbish, MD     Referring Physician: Vannie Reche RAMAN, NP     Alyssa Blanchard is a 53 y.o. female with a history of obesity s/p gastric bypass, CHF, OSA, T2DM, pulmonary hypertension, and atrial fibrillation who presents for consultation in the [facility]. Seen by Cardiology on 9/23 and patient has noted increased episodes of Afib. Patient is on Eliquis  for a CHADS2VASC score of 3.  On evaluation today, patient is currently in NSR. She has noted increased burden of Afib. Last week she notes episodes on Monday, Friday, and yesterday. She has not missed any doses of Eliquis .  Today, she denies symptoms of chest pain, orthopnea, PND, dizziness, presyncope, syncope, bleeding, or neurologic sequela. The patient is tolerating medications without difficulties and is otherwise without complaint today.    Atrial Fibrillation Risk Factors:  she does have symptoms or diagnosis of sleep apnea. she is compliant with CPAP therapy.   she has a BMI of Body mass index is 64.67 kg/m.SABRA Filed Weights   12/07/23 1315  Weight: (!) 176.3 kg    Current Outpatient Medications  Medication Sig Dispense Refill   acetaminophen  (TYLENOL ) 325 MG tablet Take 2 tablets (650 mg total) by mouth every 6 (six) hours as needed for mild pain (pain score 1-3) (or Fever >/= 101).     albuterol  (PROVENTIL ) (2.5 MG/3ML) 0.083% nebulizer solution Take 3 mLs by nebulization every 4 (four) hours as needed for wheezing or shortness of breath. 90 mL 12   apixaban  (ELIQUIS ) 5 MG TABS tablet Take 1 tablet (5 mg total) by mouth 2 (two) times daily. 60 tablet 0   budesonide -glycopyrrolate -formoterol  (BREZTRI AEROSPHERE) 160-9-4.8 MCG/ACT AERO inhaler Inhale 2 puffs into the lungs 2 (two) times daily.     bumetanide  (BUMEX ) 1 MG tablet Take 3 tablets (3 mg total) by mouth  daily. 90 tablet 6   cetirizine  (ZYRTEC ) 10 MG tablet Take 1 tablet (10 mg total) by mouth daily. 30 tablet 0   clindamycin (CLEOCIN T) 1 % lotion Apply topically as needed.     diclofenac  Sodium (VOLTAREN  ARTHRITIS PAIN) 1 % GEL Apply 2 g topically 2 (two) times daily. 50 g 0   docusate sodium  (COLACE) 100 MG capsule Take 200 mg by mouth as needed for mild constipation.     Dupilumab  (DUPIXENT ) 300 MG/2ML SOPN Inject 300 mg into the skin every 14 (fourteen) days.     flecainide  (TAMBOCOR ) 100 MG tablet Take 1 tablet by mouth twice daily ONLY AS NEEDED - max 2 tablets in a 24 hour period 10 tablet 0   Lifitegrast  (XIIDRA ) 5 % SOLN Place 1 drop into both eyes in the morning and at bedtime.     losartan  (COZAAR ) 25 MG tablet Take 1 tablet (25 mg total) by mouth daily. 90 tablet 3   magnesium  oxide (MAG-OX) 400 MG tablet Take 1 tablet (400 mg total) by mouth daily. 30 tablet 0   melatonin 5 MG TABS Take 2 tablets (10 mg total) by mouth at bedtime. 60 tablet 0   metolazone  (ZAROXOLYN ) 5 MG tablet Take 1 tablet (5 mg total) by mouth 3 (three) times a week. Take an extra 40mEq of potassium with Metolazone  (Patient taking differently: Take 5 mg by mouth 2 (two) times a week. Take an extra 40mEq of potassium with Metolazone ) 15 tablet 3   metoprolol  succinate (TOPROL -XL) 25 MG 24  hr tablet Take 2 tablets (50 mg total) by mouth daily. 180 tablet 3   Multiple Vitamins-Minerals (BARIATRIC MULTIVITAMINS/IRON  PO) Take 1 tablet by mouth daily with breakfast.     omeprazole (PRILOSEC) 20 MG capsule Take 20 mg by mouth 2 (two) times daily before a meal.     polyethylene glycol (MIRALAX  / GLYCOLAX ) 17 g packet Take 17 g by mouth as needed.     potassium chloride  SA (KLOR-CON  M) 20 MEQ tablet Take 4 tablets (80 mEq total) by mouth every morning AND 2 tablets (40 mEq total) every evening.     pregabalin  (LYRICA ) 300 MG capsule Take 300 mg by mouth 2 (two) times daily.     silver  sulfADIAZINE  (SILVADENE ) 1 % cream  Apply topically daily. Apply to right side 50 g 0   spironolactone  (ALDACTONE ) 25 MG tablet Take 2 tablets (50 mg total) by mouth daily. 90 tablet 3   pantoprazole  (PROTONIX ) 20 MG tablet Take 2 tablets (40 mg total) by mouth 2 (two) times daily. (Patient not taking: Reported on 12/07/2023) 120 tablet 0   tirzepatide (ZEPBOUND) 10 MG/0.5ML Pen Inject 10 mg into the skin once a week. (Patient not taking: Reported on 12/07/2023)     No current facility-administered medications for this encounter.   Facility-Administered Medications Ordered in Other Encounters  Medication Dose Route Frequency Provider Last Rate Last Admin   acetaminophen  (TYLENOL ) tablet 650 mg  650 mg Oral Once Thayil, Irene T, PA-C       ferric derisomaltose  (MONOFERRIC ) 1,000 mg in sodium chloride  0.9 % 100 mL infusion  1,000 mg Intravenous Once Thayil, Irene T, PA-C        Atrial Fibrillation Management history:  Previous antiarrhythmic drugs: flecainide  pill in pocket, tikosyn  500 ([facility]) Previous cardioversions: 04/28/23 Previous ablations: none Anticoagulation history: Eliquis    ROS- All systems are reviewed and negative except as per the HPI above.  Physical Exam: BP 130/76   Pulse 71   Ht 5' 5 (1.651 m)   Wt (!) 176.3 kg   LMP 12/20/2017   BMI 64.67 kg/m   GEN: Well nourished, well developed in no acute distress NECK: No JVD; No carotid bruits CARDIAC: Regular rate and rhythm, no murmurs, rubs, gallops RESPIRATORY:  Clear to auscultation without rales, wheezing or rhonchi  ABDOMEN: Soft, non-tender, non-distended EXTREMITIES:  No edema; No deformity   EKG today demonstrates  Vent. rate 71 BPM PR interval 166 ms QRS duration 80 ms QT/QTcB 390/423 ms P-R-T axes 41 29 27 Normal sinus rhythm Cannot rule out Anterior infarct (cited on or before 07-Dec-2023) Abnormal ECG When compared with ECG of 30-Nov-2023 10:44, No significant change was found  Echo 04/24/23 demonstrated  1. Left ventricular  ejection fraction, by estimation, is 70 to 75%. The  left ventricle has hyperdynamic function. The left ventricle has no  regional wall motion abnormalities. There is mild left ventricular  hypertrophy. Left ventricular diastolic  parameters were normal (medial e' 9.7 cm/s).   2. Right ventricular systolic function is normal. The right ventricular  size is normal. There is mildly elevated pulmonary artery systolic  pressure. The estimated right ventricular systolic pressure is 38.5 mmHg.   3. The mitral valve is grossly normal. Trivial mitral valve  regurgitation. No evidence of mitral stenosis.   4. The aortic valve was not well visualized. There is mild calcification  of the aortic valve. Aortic valve regurgitation is not visualized. No  aortic stenosis is present.   5. The inferior vena cava  is dilated in size with >50% respiratory  variability, suggesting right atrial pressure of 8 mmHg.   6. Increased flow velocities may be secondary to anemia, thyrotoxicosis,  hyperdynamic or high flow state.   ASSESSMENT & PLAN CHA2DS2-VASc Score = 4  The patient's score is based upon: CHF History: 1 HTN History: 1 Diabetes History: 1 Stroke History: 0 Vascular Disease History: 0 Age Score: 0 Gender Score: 1       ASSESSMENT AND PLAN: Paroxysmal Atrial Fibrillation (ICD10:  I48.0) The patient's CHA2DS2-VASc score is 4, indicating a 4.8% annual risk of stroke.    Patient is currently in NSR. They are on flecainide  pill in pocket strategy and have noted increased Afib burden. This along with chronic history of HFpEF means should move away from flecainide  use. We had a discussion about Tikosyn  for long term rhythm control. Review of records show patient was placed on Tikosyn  ~3 years ago at [facility] and missed a couple of doses with it not being restarted. She was on 500 mcg dose per records. We talked about the monitoring required for this medication, hospital admission for Tikosyn , and  potential adverse effects. I emphasized compliance with twice daily dosing to avoid having to stop medication due to missed doses. I did caution patient if non compliance is noted again that our EP team may not elect to restart this medication in the future. She will have to hold Zepbound leading up to admission and not take flecainide  starting 3 days prior to admission. No benadryl . Patient wishes to proceed with Tikosyn  admission. Patient had bloodwork on 9/3 but I note down trending mag and potassium; she is on a significant diuresis regimen so will recheck labs today.   PR 166 ms Qtc 423 ms CrCl 279 mL/min   Secondary Hypercoagulable State (ICD10:  D68.69) The patient is at significant risk for stroke/thromboembolism based upon her CHA2DS2-VASc Score of 4.  Continue Apixaban  (Eliquis ).   Continue Eliquis  without interruption.    Follow up for Tikosyn  admission once insurance approval is obtained.   [facility], [facility]  [facility] [address] [place], [place] [address] [phone]

## 2023-12-08 ENCOUNTER — Ambulatory Visit (HOSPITAL_COMMUNITY): Payer: Self-pay | Admitting: Internal Medicine

## 2023-12-08 LAB — CBC
Hematocrit: 39.8 % (ref 34.0–46.6)
Hemoglobin: 11.7 g/dL (ref 11.1–15.9)
MCH: 25 pg — ABNORMAL LOW (ref 26.6–33.0)
MCHC: 29.4 g/dL — ABNORMAL LOW (ref 31.5–35.7)
MCV: 85 fL (ref 79–97)
Platelets: 203 x10E3/uL (ref 150–450)
RBC: 4.68 x10E6/uL (ref 3.77–5.28)
RDW: 16.6 % — ABNORMAL HIGH (ref 11.7–15.4)
WBC: 8 x10E3/uL (ref 3.4–10.8)

## 2023-12-08 LAB — BASIC METABOLIC PANEL WITH GFR
BUN/Creatinine Ratio: 11 (ref 9–23)
BUN: 8 mg/dL (ref 6–24)
CO2: 22 mmol/L (ref 20–29)
Calcium: 9 mg/dL (ref 8.7–10.2)
Chloride: 103 mmol/L (ref 96–106)
Creatinine, Ser: 0.75 mg/dL (ref 0.57–1.00)
Glucose: 111 mg/dL — ABNORMAL HIGH (ref 70–99)
Potassium: 4.2 mmol/L (ref 3.5–5.2)
Sodium: 138 mmol/L (ref 134–144)
eGFR: 95 mL/min/1.73 (ref >59)

## 2023-12-09 ENCOUNTER — Encounter (HOSPITAL_BASED_OUTPATIENT_CLINIC_OR_DEPARTMENT_OTHER): Payer: Self-pay

## 2023-12-09 ENCOUNTER — Telehealth: Payer: Self-pay

## 2023-12-09 NOTE — Telephone Encounter (Signed)
   Pre-operative Risk Assessment    Patient Name: Alyssa Blanchard  DOB: 1970-04-26 MRN: [id number]   Date of last office visit: 12/01/23 Date of next office visit: Mot scheduled  Request for Surgical Clearance    Procedure:  Dental Extraction - Amount of Teeth to be Pulled:  x 11 teeth with alveoloplasty  Date of Surgery:  Clearance TBD                                Surgeon:  Glendia Primrose, DMD, PA Surgeon's Group or Practice Name:  [facility] Phone number:  [phone] Fax number:  [phone]   Type of Clearance Requested:   - Medical  - Pharmacy:  Hold Apixaban  (Eliquis )     Type of Anesthesia:  General    Additional requests/questions:    Bonney Ival LOISE Gerome   12/09/2023, 3:02 PM

## 2023-12-09 NOTE — Telephone Encounter (Signed)
 Will forward to pharmD for [facility] hold - appears they may have a planned tikosyn  load.

## 2023-12-09 NOTE — Telephone Encounter (Signed)
 2nd request received.  Will route to the preop pool for the preop app to review

## 2023-12-10 LAB — SPECIMEN STATUS REPORT

## 2023-12-10 LAB — MAGNESIUM: Magnesium: 2.2 mg/dL (ref 1.6–2.3)

## 2023-12-12 ENCOUNTER — Encounter (HOSPITAL_COMMUNITY): Payer: Self-pay

## 2023-12-12 ENCOUNTER — Inpatient Hospital Stay (HOSPITAL_COMMUNITY)
Admission: EM | Admit: 2023-12-12 | Discharge: 2023-12-18 | DRG: 308 | Disposition: A | Discharge status: Home / Self Care | Attending: Internal Medicine | Admitting: Internal Medicine

## 2023-12-12 ENCOUNTER — Telehealth: Payer: Self-pay | Admitting: Physician Assistant

## 2023-12-12 ENCOUNTER — Other Ambulatory Visit: Payer: Self-pay

## 2023-12-12 ENCOUNTER — Emergency Department (HOSPITAL_COMMUNITY)

## 2023-12-12 DIAGNOSIS — E119 Type 2 diabetes mellitus without complications: Secondary | ICD-10-CM | POA: Diagnosis not present

## 2023-12-12 DIAGNOSIS — E871 Hypo-osmolality and hyponatremia: Secondary | ICD-10-CM | POA: Diagnosis present

## 2023-12-12 DIAGNOSIS — I110 Hypertensive heart disease with heart failure: Secondary | ICD-10-CM | POA: Diagnosis present

## 2023-12-12 DIAGNOSIS — I5033 Acute on chronic diastolic (congestive) heart failure: Secondary | ICD-10-CM | POA: Diagnosis present

## 2023-12-12 DIAGNOSIS — G4733 Obstructive sleep apnea (adult) (pediatric): Secondary | ICD-10-CM | POA: Diagnosis present

## 2023-12-12 DIAGNOSIS — Z5941 Food insecurity: Secondary | ICD-10-CM

## 2023-12-12 DIAGNOSIS — E876 Hypokalemia: Secondary | ICD-10-CM | POA: Diagnosis present

## 2023-12-12 DIAGNOSIS — Z808 Family history of malignant neoplasm of other organs or systems: Secondary | ICD-10-CM

## 2023-12-12 DIAGNOSIS — I4819 Other persistent atrial fibrillation: Principal | ICD-10-CM | POA: Diagnosis present

## 2023-12-12 DIAGNOSIS — Z7951 Long term (current) use of inhaled steroids: Secondary | ICD-10-CM

## 2023-12-12 DIAGNOSIS — Z560 Unemployment, unspecified: Secondary | ICD-10-CM

## 2023-12-12 DIAGNOSIS — I10 Essential (primary) hypertension: Secondary | ICD-10-CM | POA: Diagnosis present

## 2023-12-12 DIAGNOSIS — I480 Paroxysmal atrial fibrillation: Secondary | ICD-10-CM | POA: Diagnosis not present

## 2023-12-12 DIAGNOSIS — I4891 Unspecified atrial fibrillation: Secondary | ICD-10-CM | POA: Diagnosis not present

## 2023-12-12 DIAGNOSIS — Z87891 Personal history of nicotine dependence: Secondary | ICD-10-CM

## 2023-12-12 DIAGNOSIS — J45909 Unspecified asthma, uncomplicated: Secondary | ICD-10-CM | POA: Diagnosis present

## 2023-12-12 DIAGNOSIS — Z800 Family history of malignant neoplasm of digestive organs: Secondary | ICD-10-CM

## 2023-12-12 DIAGNOSIS — Z79899 Other long term (current) drug therapy: Secondary | ICD-10-CM

## 2023-12-12 DIAGNOSIS — Z8419 Family history of other disorders of kidney and ureter: Secondary | ICD-10-CM

## 2023-12-12 DIAGNOSIS — Z5982 Transportation insecurity: Secondary | ICD-10-CM

## 2023-12-12 DIAGNOSIS — Z9884 Bariatric surgery status: Secondary | ICD-10-CM

## 2023-12-12 DIAGNOSIS — F32A Depression, unspecified: Secondary | ICD-10-CM | POA: Diagnosis present

## 2023-12-12 DIAGNOSIS — Z6841 Body Mass Index (BMI) 40.0 and over, adult: Secondary | ICD-10-CM

## 2023-12-12 DIAGNOSIS — K029 Dental caries, unspecified: Secondary | ICD-10-CM | POA: Diagnosis present

## 2023-12-12 DIAGNOSIS — Z91048 Other nonmedicinal substance allergy status: Secondary | ICD-10-CM

## 2023-12-12 DIAGNOSIS — Z87440 Personal history of urinary (tract) infections: Secondary | ICD-10-CM

## 2023-12-12 DIAGNOSIS — Z83438 Family history of other disorder of lipoprotein metabolism and other lipidemia: Secondary | ICD-10-CM

## 2023-12-12 DIAGNOSIS — E66813 Obesity, class 3: Secondary | ICD-10-CM | POA: Diagnosis present

## 2023-12-12 DIAGNOSIS — I272 Pulmonary hypertension, unspecified: Secondary | ICD-10-CM | POA: Diagnosis present

## 2023-12-12 DIAGNOSIS — Z820 Family history of epilepsy and other diseases of the nervous system: Secondary | ICD-10-CM

## 2023-12-12 DIAGNOSIS — Z8249 Family history of ischemic heart disease and other diseases of the circulatory system: Secondary | ICD-10-CM

## 2023-12-12 DIAGNOSIS — F418 Other specified anxiety disorders: Secondary | ICD-10-CM | POA: Diagnosis present

## 2023-12-12 DIAGNOSIS — D509 Iron deficiency anemia, unspecified: Secondary | ICD-10-CM | POA: Diagnosis present

## 2023-12-12 DIAGNOSIS — J4540 Moderate persistent asthma, uncomplicated: Secondary | ICD-10-CM | POA: Diagnosis present

## 2023-12-12 DIAGNOSIS — Z833 Family history of diabetes mellitus: Secondary | ICD-10-CM

## 2023-12-12 DIAGNOSIS — Z7985 Long-term (current) use of injectable non-insulin antidiabetic drugs: Secondary | ICD-10-CM

## 2023-12-12 DIAGNOSIS — Z888 Allergy status to other drugs, medicaments and biological substances status: Secondary | ICD-10-CM

## 2023-12-12 DIAGNOSIS — Z91040 Latex allergy status: Secondary | ICD-10-CM

## 2023-12-12 DIAGNOSIS — D6869 Other thrombophilia: Secondary | ICD-10-CM | POA: Diagnosis present

## 2023-12-12 DIAGNOSIS — F419 Anxiety disorder, unspecified: Secondary | ICD-10-CM | POA: Diagnosis present

## 2023-12-12 DIAGNOSIS — Z7901 Long term (current) use of anticoagulants: Secondary | ICD-10-CM

## 2023-12-12 LAB — CBC
HCT: 39.7 % (ref 36.0–46.0)
Hemoglobin: 11.6 g/dL — ABNORMAL LOW (ref 12.0–15.0)
MCH: 25.7 pg — ABNORMAL LOW (ref 26.0–34.0)
MCHC: 29.2 g/dL — ABNORMAL LOW (ref 30.0–36.0)
MCV: 87.8 fL (ref 80.0–100.0)
Platelets: 186 K/uL (ref 150–400)
RBC: 4.52 MIL/uL (ref 3.87–5.11)
RDW: 18.3 % — ABNORMAL HIGH (ref 11.5–15.5)
WBC: 8.3 K/uL (ref 4.0–10.5)
nRBC: 0 % (ref 0.0–0.2)

## 2023-12-12 LAB — BASIC METABOLIC PANEL WITH GFR
Anion gap: 11 (ref 5–15)
BUN: 14 mg/dL (ref 6–20)
CO2: 24 mmol/L (ref 22–32)
Calcium: 8.7 mg/dL — ABNORMAL LOW (ref 8.9–10.3)
Chloride: 104 mmol/L (ref 98–111)
Creatinine, Ser: <0.3 mg/dL — ABNORMAL LOW (ref 0.44–1.00)
Glucose, Bld: 109 mg/dL — ABNORMAL HIGH (ref 70–99)
Potassium: 3.7 mmol/L (ref 3.5–5.1)
Sodium: 139 mmol/L (ref 135–145)

## 2023-12-12 LAB — MAGNESIUM: Magnesium: 1.7 mg/dL (ref 1.7–2.4)

## 2023-12-12 MED ORDER — BUMETANIDE 2 MG PO TABS: 3.0000 mg | ORAL_TABLET | Freq: Every day | ORAL | Status: DC

## 2023-12-12 MED ORDER — METOPROLOL SUCCINATE ER 50 MG PO TB24: 50.0000 mg | ORAL_TABLET | Freq: Every day | ORAL | Status: DC

## 2023-12-12 MED ORDER — DILTIAZEM LOAD VIA INFUSION
15.0000 mg | Freq: Once | INTRAVENOUS | Status: AC
  Administered 2023-12-12: 15 mg via INTRAVENOUS
  Filled 2023-12-12: qty 15

## 2023-12-12 MED ORDER — MELATONIN 5 MG PO TABS
10.0000 mg | ORAL_TABLET | Freq: Every day | ORAL | Status: DC
  Administered 2023-12-12 – 2023-12-17 (×6): 10 mg via ORAL
  Filled 2023-12-12 (×6): qty 2

## 2023-12-12 MED ORDER — POLYETHYLENE GLYCOL 3350 17 G PO PACK
17.0000 g | PACK | Freq: Every day | ORAL | Status: DC | PRN
  Administered 2023-12-14: 17 g via ORAL
  Filled 2023-12-12: qty 1

## 2023-12-12 MED ORDER — MAGNESIUM SULFATE IN D5W 1-5 GM/100ML-% IV SOLN
1.0000 g | Freq: Once | INTRAVENOUS | Status: AC
  Administered 2023-12-13: 1 g via INTRAVENOUS
  Filled 2023-12-12: qty 100

## 2023-12-12 MED ORDER — POTASSIUM CHLORIDE CRYS ER 20 MEQ PO TBCR: 80.0000 meq | EXTENDED_RELEASE_TABLET | Freq: Every morning | ORAL | Status: DC

## 2023-12-12 MED ORDER — PROCHLORPERAZINE EDISYLATE 10 MG/2ML IJ SOLN
5.0000 mg | Freq: Four times a day (QID) | INTRAMUSCULAR | Status: DC | PRN
  Filled 2023-12-12: qty 2

## 2023-12-12 MED ORDER — SODIUM CHLORIDE 0.9% FLUSH
3.0000 mL | Freq: Two times a day (BID) | INTRAVENOUS | Status: DC
  Administered 2023-12-12 – 2023-12-17 (×7): 3 mL via INTRAVENOUS

## 2023-12-12 MED ORDER — POTASSIUM CHLORIDE CRYS ER 20 MEQ PO TBCR
40.0000 meq | EXTENDED_RELEASE_TABLET | Freq: Two times a day (BID) | ORAL | Status: DC
  Administered 2023-12-13: 40 meq via ORAL
  Filled 2023-12-12: qty 2

## 2023-12-12 MED ORDER — ACETAMINOPHEN 325 MG PO TABS
650.0000 mg | ORAL_TABLET | Freq: Four times a day (QID) | ORAL | Status: DC | PRN
  Administered 2023-12-13 – 2023-12-18 (×9): 650 mg via ORAL
  Filled 2023-12-12 (×10): qty 2

## 2023-12-12 MED ORDER — INSULIN ASPART 100 UNIT/ML IJ SOLN
0.0000 [IU] | Freq: Three times a day (TID) | INTRAMUSCULAR | Status: DC
  Administered 2023-12-13 – 2023-12-14 (×2): 1 [IU] via SUBCUTANEOUS
  Administered 2023-12-15: 2 [IU] via SUBCUTANEOUS
  Administered 2023-12-15 – 2023-12-17 (×5): 1 [IU] via SUBCUTANEOUS
  Administered 2023-12-18: 2 [IU] via SUBCUTANEOUS

## 2023-12-12 MED ORDER — ACETAMINOPHEN 650 MG RE SUPP: 650.0000 mg | Freq: Four times a day (QID) | RECTAL | Status: DC | PRN

## 2023-12-12 MED ORDER — BUDESON-GLYCOPYRROL-FORMOTEROL 160-9-4.8 MCG/ACT IN AERO
2.0000 | INHALATION_SPRAY | Freq: Two times a day (BID) | RESPIRATORY_TRACT | Status: DC
  Administered 2023-12-12 – 2023-12-18 (×12): 2 via RESPIRATORY_TRACT
  Filled 2023-12-12: qty 5.9

## 2023-12-12 MED ORDER — PREGABALIN 100 MG PO CAPS
300.0000 mg | ORAL_CAPSULE | Freq: Two times a day (BID) | ORAL | Status: DC
  Administered 2023-12-12 – 2023-12-18 (×12): 300 mg via ORAL
  Filled 2023-12-12 (×12): qty 3

## 2023-12-12 MED ORDER — ALBUTEROL SULFATE (2.5 MG/3ML) 0.083% IN NEBU: 3.0000 mL | INHALATION_SOLUTION | RESPIRATORY_TRACT | Status: DC | PRN

## 2023-12-12 MED ORDER — INSULIN ASPART 100 UNIT/ML IJ SOLN
0.0000 [IU] | Freq: Every day | INTRAMUSCULAR | Status: DC
  Administered 2023-12-15: 2 [IU] via SUBCUTANEOUS

## 2023-12-12 MED ORDER — POTASSIUM CHLORIDE CRYS ER 20 MEQ PO TBCR: 40.0000 meq | EXTENDED_RELEASE_TABLET | Freq: Every evening | ORAL | Status: DC

## 2023-12-12 MED ORDER — APIXABAN 5 MG PO TABS
5.0000 mg | ORAL_TABLET | Freq: Two times a day (BID) | ORAL | Status: DC
  Administered 2023-12-12 – 2023-12-18 (×12): 5 mg via ORAL
  Filled 2023-12-12 (×12): qty 1

## 2023-12-12 MED ORDER — SPIRONOLACTONE 25 MG PO TABS
50.0000 mg | ORAL_TABLET | Freq: Every day | ORAL | Status: DC
  Administered 2023-12-13 – 2023-12-18 (×6): 50 mg via ORAL
  Filled 2023-12-12 (×6): qty 2

## 2023-12-12 MED ORDER — DILTIAZEM HCL-DEXTROSE 125-5 MG/125ML-% IV SOLN (PREMIX)
5.0000 mg/h | INTRAVENOUS | Status: DC
  Administered 2023-12-12: 5 mg/h via INTRAVENOUS
  Administered 2023-12-13: 7.5 mg/h via INTRAVENOUS
  Administered 2023-12-13: 10 mg/h via INTRAVENOUS
  Filled 2023-12-12 (×4): qty 125

## 2023-12-12 MED ORDER — METOPROLOL TARTRATE 50 MG PO TABS
50.0000 mg | ORAL_TABLET | Freq: Four times a day (QID) | ORAL | Status: DC
  Administered 2023-12-13 – 2023-12-17 (×17): 50 mg via ORAL
  Filled 2023-12-12 (×18): qty 1

## 2023-12-12 MED ORDER — FUROSEMIDE 10 MG/ML IJ SOLN
120.0000 mg | Freq: Once | INTRAVENOUS | Status: AC
  Administered 2023-12-13: 120 mg via INTRAVENOUS
  Filled 2023-12-12: qty 2

## 2023-12-12 MED ORDER — PANTOPRAZOLE SODIUM 40 MG PO TBEC
40.0000 mg | DELAYED_RELEASE_TABLET | Freq: Two times a day (BID) | ORAL | Status: DC
  Administered 2023-12-13 – 2023-12-18 (×11): 40 mg via ORAL
  Filled 2023-12-12 (×11): qty 1

## 2023-12-12 MED ORDER — LIFITEGRAST 5 % OP SOLN
1.0000 [drp] | Freq: Two times a day (BID) | OPHTHALMIC | Status: DC
  Administered 2023-12-13 – 2023-12-14 (×2): 1 [drp] via OPHTHALMIC

## 2023-12-12 NOTE — H&P (Signed)
 History and Physical    Alyssa Blanchard FMW:[id number] DOB: 1971/02/20 DOA: 12/12/2023  PCP: [name], MD   Patient coming from: Home   Chief Complaint: Palpitations    HPI: Alyssa Blanchard is a pleasant 53 y.o. female with medical history significant for depression, anxiety, hypertension, type 2 diabetes mellitus, asthma, OSA on CPAP, BMI 65, atrial fibrillation on Eliquis , and HFpEF who presents with rapid atrial fibrillation.  Patient reports episodes of rapid palpitations for the past 2 months but had a particularly severe episode yesterday with heart rate as high as 220.  She becomes short of breath, lightheaded, and sometimes diaphoretic with these episodes.  Since the episode last night, she took 2 doses of Tikosyn  with some temporary slowing in her HR.  She also reports compliance with Toprol  and has not missed any doses of Eliquis .  She reports that her chronic bilateral leg swelling is currently better than it had been.  She denies fevers, chills, or productive cough.  She has not had any syncope with this.  ED Course: Upon arrival to the ED, patient is found to be afebrile and saturating well on room air with elevated heart rate and SBP in the upper 80s and higher.  EKG demonstrates atrial fibrillation with rate 121.    Cardiology was consulted by the ED physician and the patient was given 15 mg IV diltiazem  and started on diltiazem  infusion.  Review of Systems:  All other systems reviewed and apart from HPI, are negative.  Past Medical History:  Diagnosis Date   Acute on chronic congestive heart failure (HCC) 05/02/2023   Anxiety    Asthma    Atrial fibrillation (HCC)    Depression    DM (diabetes mellitus), type 2 (HCC) 02/16/2022   Dysrhythmia    new onset Afib rvr   GERD (gastroesophageal reflux disease)    Heart failure (HCC)    Heel spur    bilat   History of bronchitis    History of chicken pox    History of urinary tract infection    Hypertension     Migraines    Plantar fasciitis    bilat   STD (sexually transmitted disease)    chl hx & hsv 1&2   Tremors of nervous system    Urinary incontinence     Past Surgical History:  Procedure Laterality Date   BIOPSY  10/14/2022   Procedure: BIOPSY;  Surgeon: Stacia Glendia BRAVO, MD;  Location: [facility];  Service: Gastroenterology;;   CARDIOVERSION N/A 08/05/2019   Procedure: CARDIOVERSION;  Surgeon: Alveta Aleene PARAS, MD;  Location: [facility];  Service: Cardiovascular;  Laterality: N/A;   CARDIOVERSION N/A 04/28/2023   Procedure: CARDIOVERSION;  Surgeon: Jeffrie Oneil BROCKS, MD;  Location: [facility];  Service: Cardiovascular;  Laterality: N/A;   COLONOSCOPY WITH PROPOFOL  N/A 10/14/2022   Procedure: COLONOSCOPY WITH PROPOFOL ;  Surgeon: Stacia Glendia BRAVO, MD;  Location: [facility];  Service: Gastroenterology;  Laterality: N/A;   ESOPHAGOGASTRODUODENOSCOPY (EGD) WITH PROPOFOL  N/A 10/14/2022   Procedure: ESOPHAGOGASTRODUODENOSCOPY (EGD) WITH PROPOFOL ;  Surgeon: Stacia Glendia BRAVO, MD;  Location: [facility];  Service: Gastroenterology;  Laterality: N/A;   LAPAROSCOPIC GASTRIC SLEEVE RESECTION N/A 11/27/2014   Procedure: LAPAROSCOPIC GASTRIC SLEEVE RESECTION WITH HIATAL HERNIA REPAIR UPPER ENDOSCOPY;  Surgeon: Donnice Lunger, MD;  Location: [facility];  Service: General;  Laterality: N/A;   POLYPECTOMY  10/14/2022   Procedure: POLYPECTOMY;  Surgeon: Stacia Glendia BRAVO, MD;  Location: [facility];  Service: Gastroenterology;;  TEE WITHOUT CARDIOVERSION N/A 08/05/2019   Procedure: TRANSESOPHAGEAL ECHOCARDIOGRAM (TEE);  Surgeon: Alveta Aleene PARAS, MD;  Location: [facility] ENDOSCOPY;  Service: Cardiovascular;  Laterality: N/A;   TONSILLECTOMY  1978    Social History:   reports that she quit smoking about 11 years ago. Her smoking use included cigarettes. She started smoking about 16 years ago. She has a 1.3 pack-year smoking history. She has never used smokeless tobacco. She reports that she does  not currently use alcohol. She reports that she does not currently use drugs after having used the following drugs: Marijuana and Cocaine.  Allergies  Allergen Reactions   Gabapentin Anaphylaxis, Nausea And Vomiting and Other (See Comments)    Cannot walk   Topiramate  Other (See Comments)    Jaw tightness and chest discomfort    Albuterol  Other (See Comments)    CAN tolerate levalbuterol    Hydroxyzine  Other (See Comments)    QT prolongation related   Imitrex  [Sumatriptan ] Nausea And Vomiting and Other (See Comments)    Increased heart rate   Latex Itching   Metformin Other (See Comments)    Interaction with heart medications   Montelukast  Other (See Comments)    Nightmares    Other Itching and Other (See Comments)    Plastic and artificial Christmas trees = Itching   Prozac [Fluoxetine] Other (See Comments)    Bad, vivid dreams   Semaglutide  Nausea And Vomiting    nausea   Trazodone  And Nefazodone Other (See Comments)    Bad dreams 01/27/20 Pt is unsure if allergic to Nefazodone   Advair Diskus [Fluticasone -Salmeterol] Palpitations   Copper-Containing Compounds Rash and Other (See Comments)    Makes the face burn, also   Tape Rash    Redness and itching    Family History  Problem Relation Age of Onset   Diabetes Mother    Hypertension Mother    Hyperlipidemia Mother    Kidney disease Mother        CKD stage 4   Pancreatic cancer Father        pancreatic   Cancer Maternal Uncle        melanoma   Cancer Paternal Uncle        kidney   Hypertension Maternal Grandmother    Diabetes Maternal Grandmother    Heart failure Maternal Grandmother        CHF   Heart attack Maternal Grandfather    Hypertension Maternal Grandfather    Hypertension Paternal Grandmother    Alzheimer's disease Paternal Grandmother    Hypertension Paternal Grandfather    Colon cancer Neg Hx    Esophageal cancer Neg Hx    Stomach cancer Neg Hx      Prior to Admission medications    Medication Sig Start Date End Date Taking? Authorizing Provider  acetaminophen  (TYLENOL ) 325 MG tablet Take 2 tablets (650 mg total) by mouth every 6 (six) hours as needed for mild pain (pain score 1-3) (or Fever >/= 101). 05/10/23  Yes Angiulli, Toribio PARAS, PA-C  albuterol  (PROVENTIL ) (2.5 MG/3ML) 0.083% nebulizer solution Take 3 mLs by nebulization every 4 (four) hours as needed for wheezing or shortness of breath. 05/12/23  Yes Angiulli, Toribio PARAS, PA-C  albuterol  (VENTOLIN  HFA) 108 (90 Base) MCG/ACT inhaler Inhale 1 puff into the lungs every 6 (six) hours as needed for wheezing or shortness of breath.   Yes [provider]  apixaban  (ELIQUIS ) 5 MG TABS tablet Take 1 tablet (5 mg total) by mouth 2 (two) times  daily. 05/11/23 12/31/23 Yes Angiulli, Toribio PARAS, PA-C  budesonide -glycopyrrolate -formoterol  (BREZTRI AEROSPHERE) 160-9-4.8 MCG/ACT AERO inhaler Inhale 2 puffs into the lungs 2 (two) times daily.   Yes [provider]  bumetanide  (BUMEX ) 1 MG tablet Take 3 tablets (3 mg total) by mouth daily. 11/16/23  Yes Colletta Manuelita Garre, PA-C  cetirizine  (ZYRTEC ) 10 MG tablet Take 1 tablet (10 mg total) by mouth daily. 05/11/23  Yes Angiulli, Daniel J, PA-C  clindamycin (CLEOCIN T) 1 % lotion Apply topically as needed.   Yes [provider]  diclofenac  Sodium (VOLTAREN  ARTHRITIS PAIN) 1 % GEL Apply 2 g topically 2 (two) times daily. 05/11/23  Yes Angiulli, Toribio PARAS, PA-C  docusate sodium  (COLACE) 100 MG capsule Take 200 mg by mouth as needed for mild constipation.   Yes [provider]  Dupilumab  (DUPIXENT ) 300 MG/2ML SOAJ Inject 300 mg into the skin every 14 (fourteen) days.   Yes [provider]  flecainide  (TAMBOCOR ) 100 MG tablet Take 1 tablet by mouth twice daily ONLY AS NEEDED - max 2 tablets in a 24 hour period 08/24/23  Yes Mealor, Augustus E, MD  Lifitegrast  (XIIDRA ) 5 % SOLN Place 1 drop into both eyes in the morning and at bedtime.   Yes [provider]   losartan  (COZAAR ) 25 MG tablet Take 1 tablet (25 mg total) by mouth daily. 12/01/23 02/29/24 Yes Vannie Reche RAMAN, NP  magnesium  oxide (MAG-OX) 400 MG tablet Take 1 tablet (400 mg total) by mouth daily. 05/11/23  Yes Angiulli, Toribio PARAS, PA-C  melatonin 5 MG TABS Take 2 tablets (10 mg total) by mouth at bedtime. 05/11/23  Yes Angiulli, Toribio PARAS, PA-C  metolazone  (ZAROXOLYN ) 5 MG tablet Take 1 tablet (5 mg total) by mouth 3 (three) times a week. Take an extra 40mEq of potassium with Metolazone  Patient taking differently: Take 5 mg by mouth 2 (two) times a week. Take an extra 40mEq of potassium with Metolazone  06/17/23  Yes Zenaida Morene PARAS, MD  metoprolol  succinate (TOPROL -XL) 25 MG 24 hr tablet Take 2 tablets (50 mg total) by mouth daily. Patient taking differently: Take 25 mg by mouth 2 (two) times daily. 06/16/23  Yes Zenaida Morene PARAS, MD  Multiple Vitamins-Minerals (BARIATRIC MULTIVITAMINS/IRON  PO) Take 1 tablet by mouth daily with breakfast.   Yes [provider]  omeprazole (PRILOSEC) 20 MG capsule Take 20 mg by mouth 2 (two) times daily before a meal.   Yes [provider]  polyethylene glycol (MIRALAX  / GLYCOLAX ) 17 g packet Take 17 g by mouth as needed.   Yes [provider]  potassium chloride  SA (KLOR-CON  M) 20 MEQ tablet Take 4 tablets (80 mEq total) by mouth every morning AND 2 tablets (40 mEq total) every evening. 08/31/23  Yes [place], Ramos, FNP  pregabalin  (LYRICA ) 300 MG capsule Take 300 mg by mouth 2 (two) times daily.   Yes [provider]  silver  sulfADIAZINE  (SILVADENE ) 1 % cream Apply topically daily. Apply to right side 05/11/23  Yes Angiulli, Toribio PARAS, PA-C  spironolactone  (ALDACTONE ) 25 MG tablet Take 2 tablets (50 mg total) by mouth daily. 06/16/23  Yes Zenaida Morene PARAS, MD  tirzepatide [facility]) 10 MG/0.5ML Pen Inject 10 mg into the skin once a week. Thursday   Yes [provider]  DULERA  200-5 MCG/ACT AERO Inhale 2 puffs into the  lungs in the morning and at bedtime. 05/22/23 08/31/23      Physical Exam: Vitals:   12/12/23 2200 12/12/23 2204 12/12/23 2219  12/12/23 2234  BP:  (!) 120/59 116/64 113/69  Pulse:  76  93  Resp:      Temp:      TempSrc:      SpO2:  95%  96%  Height: 5' 5 (1.651 m)       Constitutional: NAD, no pallor or diaphoresis   Eyes: PERTLA, lids and conjunctivae normal ENMT: Mucous membranes are moist. Posterior pharynx clear of any exudate or lesions.   Neck: supple, no masses  Respiratory: no wheezing, no crackles. No accessory muscle use.  Cardiovascular: Rate ~120 and irregularly irregular. Pretibial pitting edema.  Abdomen: No tenderness, soft. Bowel sounds active.  Musculoskeletal: no clubbing / cyanosis. No joint deformity upper and lower extremities.   Skin: no significant rashes, lesions, ulcers. Warm, dry, well-perfused. Neurologic: CN 2-12 grossly intact. Moving all extremities. Alert and oriented.  Psychiatric: Calm. Cooperative.    Labs and Imaging on Admission: I have personally reviewed following labs and imaging studies  CBC: Recent Labs  Lab 12/07/23 1431 12/12/23 1721  WBC 8.0 8.3  HGB 11.7 11.6*  HCT 39.8 39.7  MCV 85 87.8  PLT 203 186   Basic Metabolic Panel: Recent Labs  Lab 12/07/23 1431 12/12/23 1721  NA 138 139  K 4.2 3.7  CL 103 104  CO2 22 24  GLUCOSE 111* 109*  BUN 8 14  CREATININE 0.75 <0.30*  CALCIUM 9.0 8.7*  MG 2.2 1.7   GFR: CrCl cannot be calculated (This lab value cannot be used to calculate CrCl because it is not a number: <0.30). Liver Function Tests: No results for input(s): AST, ALT, ALKPHOS, BILITOT, PROT, ALBUMIN in the last 168 hours. No results for input(s): LIPASE, AMYLASE in the last 168 hours. No results for input(s): AMMONIA in the last 168 hours. Coagulation Profile: No results for input(s): INR, PROTIME in the last 168 hours. Cardiac Enzymes: No results for input(s): CKTOTAL, CKMB,  CKMBINDEX, TROPONINI in the last 168 hours. BNP (last 3 results) Recent Labs    11/11/23 0910  PROBNP 195.0   HbA1C: No results for input(s): HGBA1C in the last 72 hours. CBG: No results for input(s): GLUCAP in the last 168 hours. Lipid Profile: No results for input(s): CHOL, HDL, LDLCALC, TRIG, CHOLHDL, LDLDIRECT in the last 72 hours. Thyroid Function Tests: No results for input(s): TSH, T4TOTAL, FREET4, T3FREE, THYROIDAB in the last 72 hours. Anemia Panel: No results for input(s): VITAMINB12, FOLATE, FERRITIN, TIBC, IRON , RETICCTPCT in the last 72 hours. Urine analysis:    Component Value Date/Time   COLORURINE YELLOW 11/11/2023 1022   APPEARANCEUR CLEAR 11/11/2023 1022   LABSPEC 1.005 11/11/2023 1022   PHURINE 7.0 11/11/2023 1022   GLUCOSEU NEGATIVE 11/11/2023 1022   HGBUR NEGATIVE 11/11/2023 1022   BILIRUBINUR NEGATIVE 11/11/2023 1022   BILIRUBINUR negative 07/28/2016 1614   BILIRUBINUR neg 08/29/2014 1528   KETONESUR NEGATIVE 11/11/2023 1022   PROTEINUR NEGATIVE 11/11/2023 1022   UROBILINOGEN 0.2 07/28/2016 1614   NITRITE NEGATIVE 11/11/2023 1022   LEUKOCYTESUR NEGATIVE 11/11/2023 1022   Sepsis Labs: @LABRCNTIP (procalcitonin:4,lacticidven:4) )No results found for this or any previous visit (from the past 240 hours).   Radiological Exams on Admission: DG Chest 1 View Result Date: 12/12/2023 CLINICAL DATA:  Atrial fibrillation. EXAM: CHEST  1 VIEW COMPARISON:  11/11/2023 FINDINGS: Lung volumes are low. Prominent heart size, likely accentuated by technique and not significantly changed. Bibasilar atelectasis. Left perihilar scarring. No pulmonary edema, large pleural effusion or pneumothorax. IMPRESSION: Low lung volumes with bibasilar atelectasis.  Electronically Signed   By: Andrea Gasman M.D.   On: 12/12/2023 18:00    EKG: Independently reviewed. Atrial fibrillation, rate 121.   Assessment/Plan   1. Atrial fibrillation  with RVR  - Appreciate cardiology consultation  - Started on IV diltiazem  in ED, now on PO Lopressor  50 mg q6h with plan to wean IV diltiazem  and cardiovert once volume status improved   2. Acute on chronic HFpEF  - EF was 70-75% on echo from February 2025 - Diurese with IV Lasix , monitor wt and I/Os, monitor renal function and electrolytes   3. Type II DM  - A1c was 6.1% in September 2025  - Check CBGs and use low-intensity SSI for now   4. Asthma; OSA  - Not in exacerbation  - Continue inhalers and CPAP while sleeping    5. Hypertension  - Losartan  held on admission after some low BP readings in ED    DVT prophylaxis: Eliquis   Code Status: Full  Level of Care: Level of care: Progressive Family Communication: None present  Disposition Plan:  Patient is from: Home  Anticipated d/c is to: Home  Anticipated d/c date is: 10/5 or 12/14/23 Patient currently: Pending management of rapid atrial fibrillation  Consults called: Cardiology  Admission status: Observation     Alyssa GORMAN Sprinkles, MD [facility]  12/12/2023, 10:53 PM

## 2023-12-12 NOTE — ED Notes (Addendum)
is resting comfortably.

## 2023-12-12 NOTE — ED Notes (Signed)
 CCMD called.

## 2023-12-12 NOTE — ED Provider Notes (Signed)
 [facility] AT [facility] Provider Note   CSN: [id number] Arrival date & time: 12/12/23  1648     Patient presents with: Atrial Fibrillation   Alyssa Blanchard is a 53 y.o. female.    Atrial Fibrillation     Patient has a history of depression acid reflux migraines atrial fibrillation congestive heart failure, diabetes, hypertension.  Patient states she has been dealing with her atrial fibrillation for the last couple days.  Patient states she has a history of A-fib.  She has been going in and out of it.  Her cardiologist has been planning on getting her her started on Tikosyn .  Patient states she has been feeling somewhat lightheaded she gets short of breath.  She called the cardiologist office today was instructed to come to the ED.  She is not have any fevers or chills.  She does have some discomfort in her chest with this  Prior to Admission medications   Medication Sig Start Date End Date Taking? Authorizing Provider  acetaminophen  (TYLENOL ) 325 MG tablet Take 2 tablets (650 mg total) by mouth every 6 (six) hours as needed for mild pain (pain score 1-3) (or Fever >/= 101). 05/10/23   Angiulli, Toribio PARAS, PA-C  albuterol  (PROVENTIL ) (2.5 MG/3ML) 0.083% nebulizer solution Take 3 mLs by nebulization every 4 (four) hours as needed for wheezing or shortness of breath. 05/12/23   Angiulli, Toribio PARAS, PA-C  apixaban  (ELIQUIS ) 5 MG TABS tablet Take 1 tablet (5 mg total) by mouth 2 (two) times daily. 05/11/23 12/31/23  Angiulli, Toribio PARAS, PA-C  budesonide -glycopyrrolate -formoterol  (BREZTRI AEROSPHERE) 160-9-4.8 MCG/ACT AERO inhaler Inhale 2 puffs into the lungs 2 (two) times daily.    [provider]  bumetanide  (BUMEX ) 1 MG tablet Take 3 tablets (3 mg total) by mouth daily. 11/16/23   Colletta Manuelita Garre, PA-C  cetirizine  (ZYRTEC ) 10 MG tablet Take 1 tablet (10 mg total) by mouth daily. 05/11/23   Angiulli, Daniel J, PA-C  clindamycin (CLEOCIN T) 1 % lotion  Apply topically as needed.    [provider]  diclofenac  Sodium (VOLTAREN  ARTHRITIS PAIN) 1 % GEL Apply 2 g topically 2 (two) times daily. 05/11/23   Angiulli, Toribio PARAS, PA-C  docusate sodium  (COLACE) 100 MG capsule Take 200 mg by mouth as needed for mild constipation.    [provider]  Dupilumab  (DUPIXENT ) 300 MG/2ML SOPN Inject 300 mg into the skin every 14 (fourteen) days.    [provider]  flecainide  (TAMBOCOR ) 100 MG tablet Take 1 tablet by mouth twice daily ONLY AS NEEDED - max 2 tablets in a 24 hour period 08/24/23   Mealor, Augustus E, MD  Lifitegrast  (XIIDRA ) 5 % SOLN Place 1 drop into both eyes in the morning and at bedtime.    [provider]  losartan  (COZAAR ) 25 MG tablet Take 1 tablet (25 mg total) by mouth daily. 12/01/23 02/29/24  Vannie Reche RAMAN, NP  magnesium  oxide (MAG-OX) 400 MG tablet Take 1 tablet (400 mg total) by mouth daily. 05/11/23   Angiulli, Toribio PARAS, PA-C  melatonin 5 MG TABS Take 2 tablets (10 mg total) by mouth at bedtime. 05/11/23   Angiulli, Toribio PARAS, PA-C  metolazone  (ZAROXOLYN ) 5 MG tablet Take 1 tablet (5 mg total) by mouth 3 (three) times a week. Take an extra 40mEq of potassium with Metolazone  Patient taking differently: Take 5 mg by mouth 2 (two) times a week. Take an extra 40mEq of potassium with Metolazone  06/17/23  Zenaida Morene PARAS, MD  metoprolol  succinate (TOPROL -XL) 25 MG 24 hr tablet Take 2 tablets (50 mg total) by mouth daily. 06/16/23   Zenaida Morene PARAS, MD  Multiple Vitamins-Minerals (BARIATRIC MULTIVITAMINS/IRON  PO) Take 1 tablet by mouth daily with breakfast.    [provider]  omeprazole (PRILOSEC) 20 MG capsule Take 20 mg by mouth 2 (two) times daily before a meal.    [provider]  pantoprazole  (PROTONIX ) 20 MG tablet Take 2 tablets (40 mg total) by mouth 2 (two) times daily. Patient not taking: Reported on 12/07/2023 05/11/23   Angiulli, Daniel J, PA-C  polyethylene glycol (MIRALAX  /  GLYCOLAX ) 17 g packet Take 17 g by mouth as needed.    [provider]  potassium chloride  SA (KLOR-CON  M) 20 MEQ tablet Take 4 tablets (80 mEq total) by mouth every morning AND 2 tablets (40 mEq total) every evening. 08/31/23   Mil[place], Harlene HERO, FNP  pregabalin  (LYRICA ) 300 MG capsule Take 300 mg by mouth 2 (two) times daily.    [provider]  silver  sulfADIAZINE  (SILVADENE ) 1 % cream Apply topically daily. Apply to right side 05/11/23   Angiulli, Toribio PARAS, PA-C  spironolactone  (ALDACTONE ) 25 MG tablet Take 2 tablets (50 mg total) by mouth daily. 06/16/23   Zenaida Morene PARAS, MD  tirzepatide [facility]) 10 MG/0.5ML Pen Inject 10 mg into the skin once a week. Patient not taking: Reported on 12/07/2023    [provider]  DULERA  200-5 MCG/ACT AERO Inhale 2 puffs into the lungs in the morning and at bedtime. 05/22/23 08/31/23      Allergies: Gabapentin, Topiramate , Albuterol , Hydroxyzine , Imitrex  [sumatriptan ], Latex, Metformin, Montelukast , Other, Prozac [fluoxetine], Semaglutide , Trazodone  and nefazodone, Advair diskus [fluticasone -salmeterol], and Copper-containing compounds    Review of Systems  Updated Vital Signs BP 100/83   Pulse (!) 107   Temp 98.4 F (36.9 C) (Oral)   Resp (!) 24   LMP 12/20/2017   SpO2 100%   Physical Exam Vitals and nursing note reviewed.  Constitutional:      Appearance: She is well-developed. She is not diaphoretic.     Comments: Increased BMI  HENT:     Head: Normocephalic and atraumatic.     Right Ear: External ear normal.     Left Ear: External ear normal.  Eyes:     General: No scleral icterus.       Right eye: No discharge.        Left eye: No discharge.     Conjunctiva/sclera: Conjunctivae normal.  Neck:     Trachea: No tracheal deviation.  Cardiovascular:     Rate and Rhythm: Regular rhythm. Tachycardia present.  Pulmonary:     Effort: Pulmonary effort is normal. No respiratory distress.     Breath sounds: Normal  breath sounds. No stridor. No wheezing or rales.  Abdominal:     General: Bowel sounds are normal. There is no distension.     Palpations: Abdomen is soft.     Tenderness: There is no abdominal tenderness. There is no guarding or rebound.  Musculoskeletal:        General: No tenderness or deformity.     Cervical back: Neck supple.     Right lower leg: No edema.     Left lower leg: No edema.  Skin:    General: Skin is warm and dry.     Findings: No rash.  Neurological:     General: No focal deficit present.     Mental  Status: She is alert.     Cranial Nerves: No cranial nerve deficit, dysarthria or facial asymmetry.     Sensory: No sensory deficit.     Motor: No abnormal muscle tone or seizure activity.     Coordination: Coordination normal.  Psychiatric:        Mood and Affect: Mood normal.     (all labs ordered are listed, but only abnormal results are displayed) Labs Reviewed  BASIC METABOLIC PANEL WITH GFR - Abnormal; Notable for the following components:      Result Value   Glucose, Bld 109 (*)    Creatinine, Ser <0.30 (*)    Calcium 8.7 (*)    All other components within normal limits  CBC - Abnormal; Notable for the following components:   Hemoglobin 11.6 (*)    MCH 25.7 (*)    MCHC 29.2 (*)    RDW 18.3 (*)    All other components within normal limits  MAGNESIUM     EKG: EKG Interpretation Date/Time:  Saturday December 12 2023 17:41:21 EDT Ventricular Rate:  121 PR Interval:    QRS Duration:  90 QT Interval:  341 QTC Calculation: 484 R Axis:   25  Text Interpretation: Atrial fibrillation Low voltage, precordial leads a fib new since last tracing Confirmed by Randol Simmonds [phone]) on 12/12/2023 5:45:43 PM  Radiology: [facility] Chest 1 View Result Date: 12/12/2023 CLINICAL DATA:  Atrial fibrillation. EXAM: CHEST  1 VIEW COMPARISON:  11/11/2023 FINDINGS: Lung volumes are low. Prominent heart size, likely accentuated by technique and not significantly changed. Bibasilar  atelectasis. Left perihilar scarring. No pulmonary edema, large pleural effusion or pneumothorax. IMPRESSION: Low lung volumes with bibasilar atelectasis. Electronically Signed   By: Andrea Gasman M.D.   On: 12/12/2023 18:00     Procedures   Medications Ordered in the ED  diltiazem  (CARDIZEM ) 1 mg/mL load via infusion 15 mg (15 mg Intravenous Bolus from Bag 12/12/23 1737)    And  diltiazem  (CARDIZEM ) 125 mg in dextrose  5% 125 mL (1 mg/mL) infusion (10 mg/hr Intravenous Rate/Dose Change 12/12/23 1821)    Clinical Course as of 12/12/23 2034  Sat Dec 12, 2023  1840 Magnesium  Magnesium  normal.  CBC normal.  Metabolic panel normal [JK]  1840 Chest x-ray without signs of pneumonia [JK]  1923 Case discussed with cardiology.  Will see patient in consultation. [JK]  2033 Case discussed with Dr. Charlton regarding admission [JK]    Clinical Course User Index [JK] Randol Simmonds, MD                                 Medical Decision Making Problems Addressed: Atrial fibrillation with [facility]): acute illness or injury that poses a threat to life or bodily functions  Amount and/or Complexity of Data Reviewed Labs: ordered. Decision-making details documented in ED Course. Radiology: ordered and independent interpretation performed.  Risk Prescription drug management. Decision regarding hospitalization.   Patient presented to the ED for evaluation of rapid atrial fibrillation.  Patient does have a history of this.  She has seen her cardiologist recently and there was discussion of possibly starting her on Tikosyn .  Patient notably tachycardic on arrival.  She was started on IV Cardizem  infusion.  Patient's heart rate has improved.  I consulted with cardiology.  Will plan on seeing her in consultation.  No need for ED cardioversion at this time.  Will plan on admitting for rate control.  Could consider cardioversion in the morning for symptoms persist.  I have consulted with Dr. Charlton from the  medical service who kindly see the patient for admission.     Final diagnoses:  Atrial fibrillation with [facility])    ED Discharge Orders          Ordered    Amb referral to AFIB Clinic        12/12/23 1721               Randol Simmonds, MD 12/12/23 2034

## 2023-12-12 NOTE — Consult Note (Signed)
 Cardiology Consultation   Patient ID: Alyssa Blanchard MRN: [id number]; DOB: July 07, 1970  Admit date: 12/12/2023 Date of Consult: 12/12/2023  PCP:  Alyssa Hansel Atlas, MD   [facility] Providers Cardiologist:  Annabella Scarce, MD  Electrophysiologist:  Eulas FORBES Furbish, MD       Patient Profile: Alyssa Blanchard is a 53 y.o. female with a hx of HFpEF, paroxysmal atrial fibrillation, type 2 diabetes, OSA, obesity status post gastric bypass who is being seen 12/12/2023 for the evaluation of atrial fibrillation with rapid ventricular response at the request of Alyssa Blanchard.  History of Present Illness: Alyssa Blanchard reports AF episode started Friday and is continuous. It started at a rate of 120 or so with increasing rates to as high as 225. She took two pills of flecainide  with rate decrease to 180. Had symptoms of diaphoresis, palpitations, chest tightness, tremulousness, and orthostasis. No syncope.   She does report a month of progressive orthopnea. She continues taking her loop diuretic with metolazone  with reported good urine output. She has not missed any doses of her anticoagulation.  Last seen in A-fib clinic 12/07/2023 at that time she was reporting increased episodes of atrial fibrillation and was being managed with pill in a pocket flecainide .  Given her history of HFpEF it was determined that she was not a good candidate for continued use of flecainide .  Tikosyn  was discussed at that appointment.  She has a history of Tikosyn  use but was discontinued after missing multiple doses.  A repeat trial of Tikosyn  was discussed with her and the importance of adherence and she wished to proceed with that strategy during that visit.  Notably, her potassium fluctuates significantly in the setting of loop diuretics with metolazone . Her potassium was as low as 2.8 in June of this year and is commonly <4.0.  She has planned dental extraction for dental caries with plan for dentures.    In the ED she was managed with diltiazem  gtt and HR has come down to the 120's with normotensive blood pressures.  Past Medical History:  Diagnosis Date   Acute on chronic congestive heart failure (HCC) 05/02/2023   Anxiety    Asthma    Atrial fibrillation (HCC)    Depression    DM (diabetes mellitus), type 2 (HCC) 02/16/2022   Dysrhythmia    new onset Afib rvr   GERD (gastroesophageal reflux disease)    Heart failure (HCC)    Heel spur    bilat   History of bronchitis    History of chicken pox    History of urinary tract infection    Hypertension    Migraines    Plantar fasciitis    bilat   STD (sexually transmitted disease)    chl hx & hsv 1&2   Tremors of nervous system    Urinary incontinence     Past Surgical History:  Procedure Laterality Date   BIOPSY  10/14/2022   Procedure: BIOPSY;  Surgeon: Stacia Glendia FORBES, MD;  Location: [facility];  Service: Gastroenterology;;   CARDIOVERSION N/A 08/05/2019   Procedure: CARDIOVERSION;  Surgeon: Alveta Aleene PARAS, MD;  Location: [facility] ENDOSCOPY;  Service: Cardiovascular;  Laterality: N/A;   CARDIOVERSION N/A 04/28/2023   Procedure: CARDIOVERSION;  Surgeon: Jeffrie Oneil BROCKS, MD;  Location: [facility];  Service: Cardiovascular;  Laterality: N/A;   COLONOSCOPY WITH PROPOFOL  N/A 10/14/2022   Procedure: COLONOSCOPY WITH PROPOFOL ;  Surgeon: Stacia Glendia FORBES, MD;  Location: [facility];  Service: Gastroenterology;  Laterality: N/A;   ESOPHAGOGASTRODUODENOSCOPY (EGD) WITH PROPOFOL  N/A 10/14/2022   Procedure: ESOPHAGOGASTRODUODENOSCOPY (EGD) WITH PROPOFOL ;  Surgeon: Stacia Glendia BRAVO, MD;  Location: [facility];  Service: Gastroenterology;  Laterality: N/A;   LAPAROSCOPIC GASTRIC SLEEVE RESECTION N/A 11/27/2014   Procedure: LAPAROSCOPIC GASTRIC SLEEVE RESECTION WITH HIATAL HERNIA REPAIR UPPER ENDOSCOPY;  Surgeon: Donnice Lunger, MD;  Location: [facility] ORS;  Service: General;  Laterality: N/A;   POLYPECTOMY  10/14/2022   Procedure:  POLYPECTOMY;  Surgeon: Stacia Glendia BRAVO, MD;  Location: [facility];  Service: Gastroenterology;;   TEE WITHOUT CARDIOVERSION N/A 08/05/2019   Procedure: TRANSESOPHAGEAL ECHOCARDIOGRAM (TEE);  Surgeon: Alveta Aleene PARAS, MD;  Location: [facility] ENDOSCOPY;  Service: Cardiovascular;  Laterality: N/A;   TONSILLECTOMY  1978     Home Medications:  Prior to Admission medications   Medication Sig Start Date End Date Taking? Authorizing Provider  acetaminophen  (TYLENOL ) 325 MG tablet Take 2 tablets (650 mg total) by mouth every 6 (six) hours as needed for mild pain (pain score 1-3) (or Fever >/= 101). 05/10/23   Angiulli, Toribio PARAS, PA-C  albuterol  (PROVENTIL ) (2.5 MG/3ML) 0.083% nebulizer solution Take 3 mLs by nebulization every 4 (four) hours as needed for wheezing or shortness of breath. 05/12/23   Angiulli, Toribio PARAS, PA-C  apixaban  (ELIQUIS ) 5 MG TABS tablet Take 1 tablet (5 mg total) by mouth 2 (two) times daily. 05/11/23 12/31/23  Angiulli, Toribio PARAS, PA-C  budesonide -glycopyrrolate -formoterol  (BREZTRI AEROSPHERE) 160-9-4.8 MCG/ACT AERO inhaler Inhale 2 puffs into the lungs 2 (two) times daily.    [provider]  bumetanide  (BUMEX ) 1 MG tablet Take 3 tablets (3 mg total) by mouth daily. 11/16/23   Colletta Manuelita Garre, PA-C  cetirizine  (ZYRTEC ) 10 MG tablet Take 1 tablet (10 mg total) by mouth daily. 05/11/23   Angiulli, Daniel J, PA-C  clindamycin (CLEOCIN T) 1 % lotion Apply topically as needed.    [provider]  diclofenac  Sodium (VOLTAREN  ARTHRITIS PAIN) 1 % GEL Apply 2 g topically 2 (two) times daily. 05/11/23   Angiulli, Toribio PARAS, PA-C  docusate sodium  (COLACE) 100 MG capsule Take 200 mg by mouth as needed for mild constipation.    [provider]  Dupilumab  (DUPIXENT ) 300 MG/2ML SOPN Inject 300 mg into the skin every 14 (fourteen) days.    [provider]  flecainide  (TAMBOCOR ) 100 MG tablet Take 1 tablet by mouth twice daily ONLY AS NEEDED - max 2 tablets in a 24 hour  period 08/24/23   Mealor, Augustus E, MD  Lifitegrast  (XIIDRA ) 5 % SOLN Place 1 drop into both eyes in the morning and at bedtime.    [provider]  losartan  (COZAAR ) 25 MG tablet Take 1 tablet (25 mg total) by mouth daily. 12/01/23 02/29/24  Vannie Reche RAMAN, NP  magnesium  oxide (MAG-OX) 400 MG tablet Take 1 tablet (400 mg total) by mouth daily. 05/11/23   Angiulli, Toribio PARAS, PA-C  melatonin 5 MG TABS Take 2 tablets (10 mg total) by mouth at bedtime. 05/11/23   Angiulli, Toribio PARAS, PA-C  metolazone  (ZAROXOLYN ) 5 MG tablet Take 1 tablet (5 mg total) by mouth 3 (three) times a week. Take an extra 40mEq of potassium with Metolazone  Patient taking differently: Take 5 mg by mouth 2 (two) times a week. Take an extra 40mEq of potassium with Metolazone  06/17/23   Zenaida Morene PARAS, MD  metoprolol  succinate (TOPROL -XL) 25 MG 24 hr tablet Take 2 tablets (50 mg total) by mouth daily. 06/16/23   Zenaida Morene PARAS,  MD  Multiple Vitamins-Minerals (BARIATRIC MULTIVITAMINS/IRON  PO) Take 1 tablet by mouth daily with breakfast.    [provider]  omeprazole (PRILOSEC) 20 MG capsule Take 20 mg by mouth 2 (two) times daily before a meal.    [provider]  pantoprazole  (PROTONIX ) 20 MG tablet Take 2 tablets (40 mg total) by mouth 2 (two) times daily. Patient not taking: Reported on 12/07/2023 05/11/23   Angiulli, Toribio PARAS, PA-C  polyethylene glycol (MIRALAX  / GLYCOLAX ) 17 g packet Take 17 g by mouth as needed.    [provider]  potassium chloride  SA (KLOR-CON  M) 20 MEQ tablet Take 4 tablets (80 mEq total) by mouth every morning AND 2 tablets (40 mEq total) every evening. 08/31/23   Milford, Harlene HERO, FNP  pregabalin  (LYRICA ) 300 MG capsule Take 300 mg by mouth 2 (two) times daily.    [provider]  silver  sulfADIAZINE  (SILVADENE ) 1 % cream Apply topically daily. Apply to right side 05/11/23   Angiulli, Toribio PARAS, PA-C  spironolactone  (ALDACTONE ) 25 MG tablet Take 2 tablets (50 mg  total) by mouth daily. 06/16/23   Zenaida Morene PARAS, MD  tirzepatide [facility]) 10 MG/0.5ML Pen Inject 10 mg into the skin once a week. Patient not taking: Reported on 12/07/2023    [provider]  DULERA  200-5 MCG/ACT AERO Inhale 2 puffs into the lungs in the morning and at bedtime. 05/22/23 08/31/23      Scheduled Meds:  Continuous Infusions:  diltiazem  (CARDIZEM ) infusion 10 mg/hr (12/12/23 1821)   PRN Meds:   Allergies:    Allergies  Allergen Reactions   Gabapentin Anaphylaxis, Nausea And Vomiting and Other (See Comments)    Cannot walk   Topiramate  Other (See Comments)    Jaw tightness and chest discomfort    Albuterol  Other (See Comments)    CAN tolerate levalbuterol    Hydroxyzine  Other (See Comments)    QT prolongation related   Imitrex  [Sumatriptan ] Nausea And Vomiting and Other (See Comments)    Increased heart rate   Latex Itching   Metformin Other (See Comments)    Interaction with heart medications   Montelukast  Other (See Comments)    Nightmares    Other Itching and Other (See Comments)    Plastic and artificial Christmas trees = Itching   Prozac [Fluoxetine] Other (See Comments)    Bad, vivid dreams   Semaglutide  Nausea And Vomiting    nausea   Trazodone  And Nefazodone Other (See Comments)    Bad dreams 01/27/20 Pt is unsure if allergic to Nefazodone   Advair Diskus [Fluticasone -Salmeterol] Palpitations   Copper-Containing Compounds Rash and Other (See Comments)    Makes the face burn, also    Social History:   Social History   Socioeconomic History   Marital status: Single    Spouse name: Not on file   Number of children: 0   Years of education: 12   Highest education level: 12th grade  Occupational History   Occupation: Chartered certified accountant: [employer]   Occupation: Unemployed  Tobacco Use   Smoking status: Former    Current packs/day: 0.00    Average packs/day: 0.3 packs/day for 5.0 years (1.3 ttl pk-yrs)    Types:  Cigarettes    Start date: 03/11/2007    Quit date: 03/10/2012    Years since quitting: 11.7   Smokeless tobacco: Never  Vaping Use   Vaping status: Never Used  Substance and Sexual Activity   Alcohol use: Not Currently  Comment: Reports history of binge drinking; last in 2021   Drug use: Not Currently    Types: Marijuana, Cocaine    Comment: last time used 10 years   Sexual activity: Yes    Partners: Male    Birth control/protection: Condom  Other Topics Concern   Not on file  Social History Narrative   Regular exercise-no   Caffeine Use-yes, 1-2 cups of caffeine daily   Social Drivers of Health   Financial Resource Strain: Low Risk  (10/23/2023)   Overall Financial Resource Strain (CARDIA)    Difficulty of Paying Living Expenses: Not hard at all  Food Insecurity: Food Insecurity Present (12/01/2023)   Hunger Vital Sign    Worried About Running Out of Food in the Last Year: Often true    Ran Out of Food in the Last Year: Never true  Transportation Needs: Unmet Transportation Needs (10/23/2023)   PRAPARE - Administrator, Civil Service (Medical): Yes    Lack of Transportation (Non-Medical): No  Physical Activity: Inactive (10/23/2023)   Exercise Vital Sign    Days of Exercise per Week: 0 days    Minutes of Exercise per Session: Not on file  Stress: Stress Concern Present (10/23/2023)   [employer] of Occupational Health - Occupational Stress Questionnaire    Feeling of Stress: To some extent  Social Connections: Moderately Isolated (10/23/2023)   Social Connection and Isolation Panel    Frequency of Communication with Friends and Family: Twice a week    Frequency of Social Gatherings with Friends and Family: Twice a week    Attends Religious Services: 1 to 4 times per year    Active Member of [employer] or Organizations: No    Attends Engineer, structural: Not on file    Marital Status: Never married  Intimate Partner Violence: Not At Risk (04/24/2023)    Humiliation, Afraid, Rape, and Kick questionnaire    Fear of Current or Ex-Partner: No    Emotionally Abused: No    Physically Abused: No    Sexually Abused: No    Family History:    Family History  Problem Relation Age of Onset   Diabetes Mother    Hypertension Mother    Hyperlipidemia Mother    Kidney disease Mother        CKD stage 4   Pancreatic cancer Father        pancreatic   Cancer Maternal Uncle        melanoma   Cancer Paternal Uncle        kidney   Hypertension Maternal Grandmother    Diabetes Maternal Grandmother    Heart failure Maternal Grandmother        CHF   Heart attack Maternal Grandfather    Hypertension Maternal Grandfather    Hypertension Paternal Grandmother    Alzheimer's disease Paternal Grandmother    Hypertension Paternal Grandfather    Colon cancer Neg Hx    Esophageal cancer Neg Hx    Stomach cancer Neg Hx      ROS:  Please see the history of present illness.   All other ROS reviewed and negative.     Physical Exam/Data: Vitals:   12/12/23 1825 12/12/23 1845 12/12/23 1855 12/12/23 1912  BP: 102/63 110/73 (!) 110/97 106/73  Pulse: (!) 137 (!) 119 96 (!) 118  Resp: 20 16 (!) 21 16  Temp:      TempSrc:      SpO2: 100% 100% 99% 99%  No intake or output data in the 24 hours ending 12/12/23 1917    12/07/2023    1:15 PM 12/01/2023    1:27 PM 11/30/2023   10:28 AM  Last 3 Weights  Weight (lbs) 388 lb 9.6 oz 393 lb 394 lb  Weight (kg) 176.268 kg 178.264 kg 178.717 kg     There is no height or weight on file to calculate BMI.  General: No acute distress, obese HEENT: normal Neck: JVD difficult to assess due to body habitus Vascular: No carotid bruits; Distal pulses 2+ bilaterally Cardiac:  irregularly irregular tachycardia, systolic murmur Lungs:  clear to auscultation bilaterally, no wheezing, rhonchi or rales  Abd: soft, nontender, no hepatomegaly  Ext: 1+ bilateral LE edema Musculoskeletal:  No deformities, BUE and BLE strength  normal and equal Skin: warm and dry  Neuro:  CNs 2-12 intact, no focal abnormalities noted Psych:  Normal affect   EKG:  The EKG was personally reviewed and demonstrates:  AF w/ RVR Telemetry:  Telemetry was personally reviewed and demonstrates:  AF w/ RVR  Relevant CV Studies: Echo 04/24/23:   1. Left ventricular ejection fraction, by estimation, is 70 to 75%. The  left ventricle has hyperdynamic function. The left ventricle has no  regional wall motion abnormalities. There is mild left ventricular  hypertrophy. Left ventricular diastolic  parameters were normal (medial e' 9.7 cm/s).   2. Right ventricular systolic function is normal. The right ventricular  size is normal. There is mildly elevated pulmonary artery systolic  pressure. The estimated right ventricular systolic pressure is 38.5 mmHg.   3. The mitral valve is grossly normal. Trivial mitral valve  regurgitation. No evidence of mitral stenosis.   4. The aortic valve was not well visualized. There is mild calcification  of the aortic valve. Aortic valve regurgitation is not visualized. No  aortic stenosis is present.   5. The inferior vena cava is dilated in size with >50% respiratory  variability, suggesting right atrial pressure of 8 mmHg.   6. Increased flow velocities may be secondary to anemia, thyrotoxicosis,  hyperdynamic or high flow state.    Laboratory Data: High Sensitivity Troponin:  No results for input(s): TROPONINIHS in the last 720 hours.   Chemistry Recent Labs  Lab 12/07/23 1431 12/12/23 1721  NA 138 139  K 4.2 3.7  CL 103 104  CO2 22 24  GLUCOSE 111* 109*  BUN 8 14  CREATININE 0.75 <0.30*  CALCIUM 9.0 8.7*  MG 2.2 1.7  GFRNONAA  --  NOT CALCULATED  ANIONGAP  --  11    No results for input(s): PROT, ALBUMIN, AST, ALT, ALKPHOS, BILITOT in the last 168 hours. Lipids No results for input(s): CHOL, TRIG, HDL, LABVLDL, LDLCALC, CHOLHDL in the last 168 hours.   Hematology Recent Labs  Lab 12/07/23 1431 12/12/23 1721  WBC 8.0 8.3  RBC 4.68 4.52  HGB 11.7 11.6*  HCT 39.8 39.7  MCV 85 87.8  MCH 25.0* 25.7*  MCHC 29.4* 29.2*  RDW 16.6* 18.3*  PLT 203 186   Thyroid No results for input(s): TSH, FREET4 in the last 168 hours.  BNPNo results for input(s): BNP, PROBNP in the last 168 hours.  DDimer No results for input(s): DDIMER in the last 168 hours.  Radiology/Studies:  DG Chest 1 View Result Date: 12/12/2023 CLINICAL DATA:  Atrial fibrillation. EXAM: CHEST  1 VIEW COMPARISON:  11/11/2023 FINDINGS: Lung volumes are low. Prominent heart size, likely accentuated by technique and not significantly changed. Bibasilar  atelectasis. Left perihilar scarring. No pulmonary edema, large pleural effusion or pneumothorax. IMPRESSION: Low lung volumes with bibasilar atelectasis. Electronically Signed   By: Andrea Gasman M.D.   On: 12/12/2023 18:00     Assessment and Plan: Paroxysmal atrial fibrillation with RVR - Etiology likely secondary to volume overload given her symptoms of progressive orthopnea and dyspnea for the past month. Can consider Tikosyn  load but would need to confirm accurate QTc < 440 msec prior to initiation. Currenlty in AF, QTc is reading automated of 484 msec. Manual calculation with average QTc of 440 msec over three measurements. Given the issues with potassium and borderline QTc her risk with Tikosyn  maybe prohibitive. For now plan to diurese and cardiovert when more euvolemic. Starting Metoprolol  PO 50 mg q6 hrs and will wean Diltiazem  as able. Acute on chronic HFpEF - Progressive orthopnea over the past month. Suspect contributing to increased AF burden. BNP elevated at 195 compared to baseline in September, repeat pending. Will need diuresis. On Spironolactone , not on SGLT2i and will need to price check prior to discharge. Furosemide  IV bolus ordered to start diuresis.   Risk Assessment/Risk Scores:       New [place]   Heart Association (NYHA) Functional Class NYHA Class IV  CHA2DS2-VASc Score = 4   This indicates a 4.8% annual risk of stroke. The patient's score is based upon: CHF History: 1 HTN History: 1 Diabetes History: 1 Stroke History: 0 Vascular Disease History: 0 Age Score: 0 Gender Score: 1        For questions or updates, please contact [facility] Please consult [email or web address] for contact info under      Signed, Jalaine DELENA Newcomer, MD  12/12/2023 7:17 PM

## 2023-12-12 NOTE — Telephone Encounter (Signed)
 Patient called because she had another episode of atrial fibrillation RVR.  It started last night, and has continued all day.  She is starting to get symptomatic from the A-fib and her heart rate is always elevated, the lowest she has seen is in the 120s.  She has flecainide  100 mg tablets that she can take as needed.  She has taken 2, which is the maximum and 24 hours.  Her heart rate slowed down a bit for a while, but has cranked back up again.  She takes Toprol -XL 50 mg daily which she has also taken without improvement.  Advised her that the only thing she can do is come to the [facility] ER.  Advised her that if she was otherwise okay, she could possibly be cardioverted and discharged home.  Asked her about Eliquis  compliance and she assured me she never misses a dose.  Advised her not to drive herself and recommended she call 911 for transport.  Shona Shad, PA-C 12/12/2023 4:18 PM

## 2023-12-12 NOTE — ED Notes (Signed)
 X-ray at bedside, pt requesting ice for IV site

## 2023-12-12 NOTE — ED Notes (Addendum)
 US  IV needed, ED staff to attempt first

## 2023-12-12 NOTE — ED Triage Notes (Signed)
 Patient BIB EMS from home c/o AFIB. Patient does have a history of AFIB. Patient c/o SOB, but has remained at 98% sats.

## 2023-12-13 DIAGNOSIS — E66813 Obesity, class 3: Secondary | ICD-10-CM | POA: Diagnosis present

## 2023-12-13 DIAGNOSIS — I480 Paroxysmal atrial fibrillation: Secondary | ICD-10-CM | POA: Diagnosis not present

## 2023-12-13 DIAGNOSIS — Z7951 Long term (current) use of inhaled steroids: Secondary | ICD-10-CM | POA: Diagnosis not present

## 2023-12-13 DIAGNOSIS — Z6841 Body Mass Index (BMI) 40.0 and over, adult: Secondary | ICD-10-CM | POA: Diagnosis not present

## 2023-12-13 DIAGNOSIS — Z8249 Family history of ischemic heart disease and other diseases of the circulatory system: Secondary | ICD-10-CM | POA: Diagnosis not present

## 2023-12-13 DIAGNOSIS — E119 Type 2 diabetes mellitus without complications: Secondary | ICD-10-CM | POA: Diagnosis present

## 2023-12-13 DIAGNOSIS — F419 Anxiety disorder, unspecified: Secondary | ICD-10-CM | POA: Diagnosis present

## 2023-12-13 DIAGNOSIS — I5033 Acute on chronic diastolic (congestive) heart failure: Secondary | ICD-10-CM

## 2023-12-13 DIAGNOSIS — D509 Iron deficiency anemia, unspecified: Secondary | ICD-10-CM | POA: Diagnosis present

## 2023-12-13 DIAGNOSIS — Z833 Family history of diabetes mellitus: Secondary | ICD-10-CM | POA: Diagnosis not present

## 2023-12-13 DIAGNOSIS — E871 Hypo-osmolality and hyponatremia: Secondary | ICD-10-CM | POA: Diagnosis present

## 2023-12-13 DIAGNOSIS — I110 Hypertensive heart disease with heart failure: Secondary | ICD-10-CM | POA: Diagnosis present

## 2023-12-13 DIAGNOSIS — D508 Other iron deficiency anemias: Secondary | ICD-10-CM

## 2023-12-13 DIAGNOSIS — Z91040 Latex allergy status: Secondary | ICD-10-CM | POA: Diagnosis not present

## 2023-12-13 DIAGNOSIS — I272 Pulmonary hypertension, unspecified: Secondary | ICD-10-CM | POA: Diagnosis present

## 2023-12-13 DIAGNOSIS — Z9884 Bariatric surgery status: Secondary | ICD-10-CM | POA: Diagnosis not present

## 2023-12-13 DIAGNOSIS — I10 Essential (primary) hypertension: Secondary | ICD-10-CM

## 2023-12-13 DIAGNOSIS — Z7901 Long term (current) use of anticoagulants: Secondary | ICD-10-CM | POA: Diagnosis not present

## 2023-12-13 DIAGNOSIS — E876 Hypokalemia: Secondary | ICD-10-CM

## 2023-12-13 DIAGNOSIS — Z560 Unemployment, unspecified: Secondary | ICD-10-CM | POA: Diagnosis not present

## 2023-12-13 DIAGNOSIS — D6869 Other thrombophilia: Secondary | ICD-10-CM | POA: Diagnosis present

## 2023-12-13 DIAGNOSIS — F32A Depression, unspecified: Secondary | ICD-10-CM | POA: Diagnosis present

## 2023-12-13 DIAGNOSIS — J45909 Unspecified asthma, uncomplicated: Secondary | ICD-10-CM

## 2023-12-13 DIAGNOSIS — I4819 Other persistent atrial fibrillation: Secondary | ICD-10-CM | POA: Diagnosis present

## 2023-12-13 DIAGNOSIS — Z79899 Other long term (current) drug therapy: Secondary | ICD-10-CM | POA: Diagnosis not present

## 2023-12-13 DIAGNOSIS — I4891 Unspecified atrial fibrillation: Secondary | ICD-10-CM | POA: Diagnosis present

## 2023-12-13 DIAGNOSIS — Z87891 Personal history of nicotine dependence: Secondary | ICD-10-CM | POA: Diagnosis not present

## 2023-12-13 DIAGNOSIS — Z7985 Long-term (current) use of injectable non-insulin antidiabetic drugs: Secondary | ICD-10-CM | POA: Diagnosis not present

## 2023-12-13 LAB — BASIC METABOLIC PANEL WITH GFR
Anion gap: 11 (ref 5–15)
BUN: 11 mg/dL (ref 6–20)
CO2: 29 mmol/L (ref 22–32)
Calcium: 8.2 mg/dL — ABNORMAL LOW (ref 8.9–10.3)
Chloride: 100 mmol/L (ref 98–111)
Creatinine, Ser: 0.91 mg/dL (ref 0.44–1.00)
GFR, Estimated: >60 mL/min
Glucose, Bld: 161 mg/dL — ABNORMAL HIGH (ref 70–99)
Potassium: 3.2 mmol/L — ABNORMAL LOW (ref 3.5–5.1)
Sodium: 140 mmol/L (ref 135–145)

## 2023-12-13 LAB — CBC
HCT: 34.1 % — ABNORMAL LOW (ref 36.0–46.0)
Hemoglobin: 10.4 g/dL — ABNORMAL LOW (ref 12.0–15.0)
MCH: 26.2 pg (ref 26.0–34.0)
MCHC: 30.5 g/dL (ref 30.0–36.0)
MCV: 85.9 fL (ref 80.0–100.0)
Platelets: 195 K/uL (ref 150–400)
RBC: 3.97 MIL/uL (ref 3.87–5.11)
RDW: 18.5 % — ABNORMAL HIGH (ref 11.5–15.5)
WBC: 10 K/uL (ref 4.0–10.5)
nRBC: 0 % (ref 0.0–0.2)

## 2023-12-13 LAB — GLUCOSE, CAPILLARY
Glucose-Capillary: 166 mg/dL — ABNORMAL HIGH (ref 70–99)
Glucose-Capillary: 130 mg/dL — ABNORMAL HIGH (ref 70–99)
Glucose-Capillary: 112 mg/dL — ABNORMAL HIGH (ref 70–99)
Glucose-Capillary: 137 mg/dL — ABNORMAL HIGH (ref 70–99)

## 2023-12-13 LAB — BRAIN NATRIURETIC PEPTIDE: B Natriuretic Peptide: 205.5 pg/mL — ABNORMAL HIGH (ref 0.0–100.0)

## 2023-12-13 LAB — MAGNESIUM: Magnesium: 1.8 mg/dL (ref 1.7–2.4)

## 2023-12-13 MED ORDER — LORATADINE 10 MG PO TABS
10.0000 mg | ORAL_TABLET | Freq: Every day | ORAL | Status: DC
  Administered 2023-12-14 – 2023-12-18 (×6): 10 mg via ORAL
  Filled 2023-12-13 (×6): qty 1

## 2023-12-13 MED ORDER — POTASSIUM CHLORIDE CRYS ER 20 MEQ PO TBCR: 40.0000 meq | EXTENDED_RELEASE_TABLET | ORAL | Status: DC

## 2023-12-13 MED ORDER — TRAMADOL HCL 50 MG PO TABS
50.0000 mg | ORAL_TABLET | Freq: Four times a day (QID) | ORAL | Status: DC | PRN
Start: 2023-12-13 — End: 2023-12-18
  Administered 2023-12-13 – 2023-12-18 (×7): 50 mg via ORAL
  Filled 2023-12-13 (×8): qty 1

## 2023-12-13 MED ORDER — CHLORHEXIDINE GLUCONATE CLOTH 2 % EX PADS
6.0000 | MEDICATED_PAD | Freq: Every day | CUTANEOUS | Status: DC
  Administered 2023-12-13 – 2023-12-14 (×2): 6 via TOPICAL

## 2023-12-13 MED ORDER — SODIUM CHLORIDE 0.9% FLUSH
10.0000 mL | Freq: Two times a day (BID) | INTRAVENOUS | Status: DC
  Administered 2023-12-13: 10 mL
  Administered 2023-12-13: 20 mL
  Administered 2023-12-14: 10 mL
  Administered 2023-12-15: 20 mL
  Administered 2023-12-15 – 2023-12-16 (×2): 10 mL

## 2023-12-13 MED ORDER — METOLAZONE 5 MG PO TABS
5.0000 mg | ORAL_TABLET | ORAL | Status: DC
  Administered 2023-12-14: 5 mg via ORAL
  Filled 2023-12-13: qty 1

## 2023-12-13 MED ORDER — MAGNESIUM SULFATE 2 GM/50ML IV SOLN
2.0000 g | Freq: Once | INTRAVENOUS | Status: AC
  Administered 2023-12-13: 2 g via INTRAVENOUS
  Filled 2023-12-13: qty 50

## 2023-12-13 MED ORDER — POTASSIUM CHLORIDE CRYS ER 20 MEQ PO TBCR
40.0000 meq | EXTENDED_RELEASE_TABLET | ORAL | Status: AC
  Administered 2023-12-13: 40 meq via ORAL
  Filled 2023-12-13: qty 2

## 2023-12-13 MED ORDER — BUMETANIDE 2 MG PO TABS
3.0000 mg | ORAL_TABLET | Freq: Every day | ORAL | Status: DC
  Administered 2023-12-13 – 2023-12-15 (×3): 3 mg via ORAL
  Filled 2023-12-13 (×3): qty 1

## 2023-12-13 NOTE — Hospital Course (Signed)
 Alyssa Blanchard was admitted to the hospital with the working diagnosis of atrial fibrillation.   53 yo female with the past medical history of hypertension, T2DM, asthma, depression, anxiety and obesity who presented with palpitations. Reported intermittent palpitations for the last 2 months, but more severe symptoms over the last 24 hrs prior to admission, with heart rate as high as 220 bpm. Palpitations associated with dyspnea, dizziness and diaphoresis. Symptoms persistent despite taking antiarrhythmic and AV blocker at home.  On her initial physical examination her blood pressure was 120/59, HR 120, and 02 saturation 96% Lungs with no wheezing or rhonchi, heart with S1 and S2 present, irregularly irregular, with no gallops, rubs or murmurs, abdomen with no distention and positive lower extremity edema.   Na 139, K 3.7 Cl 104 bicarbonate 24 glucose 109, bun 14 cr <0.30 Mg 1,7  BNP 205  Wbc 8,3 hgb 11.6 plt 186   Chest radiograph with cardiomegaly, bilateral hilar vascular congestion with no infiltrates or effusions.  EKG 121 bpm, normal axis, normal intervals, qtc 484, atrial fibrillation rhythm with no significant ST segment or T wave changes.   Patient was placed on higher dose of metoprolol  and IV diltiazem  drip for rate control.  10/06 wean off diltiazem , consulted EP.\ 10/07 placed on dofetilide 

## 2023-12-13 NOTE — Progress Notes (Signed)
 Progress Note   Patient: Alyssa Blanchard FMW:[id number] DOB: 22-Mar-1970 DOA: 12/12/2023     0 DOS: the patient was seen and examined on 12/13/2023   Brief hospital course: Alyssa Blanchard was admitted to the hospital with the working diagnosis of atrial fibrillation.   53 yo female with the past medical history of hypertension, T2DM, asthma, depression, anxiety and obesity who presented with palpitations. Reported intermittent palpitations for the last 2 months, but more severe symptoms over the last 24 hrs prior to admission, with heart rate as high as 220 bpm. Palpitations associated with dyspnea, dizziness and diaphoresis. Symptoms persistent despite taking antiarrhythmic and AV blocker at home.  On her initial physical examination her blood pressure was 120/59, HR 120, and 02 saturation 96% Lungs with no wheezing or rhonchi, heart with S1 and S2 present, irregularly irregular, with no gallops, rubs or murmurs, abdomen with no distention and positive lower extremity edema.   Na 139, K 3.7 Cl 104 bicarbonate 24 glucose 109, bun 14 cr <0.30 Mg 1,7  BNP 205  Wbc 8,3 hgb 11.6 plt 186   Chest radiograph with cardiomegaly, bilateral hilar vascular congestion with no infiltrates or effusions.  EKG 121 bpm, normal axis, normal intervals, qtc 484, atrial fibrillation rhythm with no significant ST segment or T wave changes.   Assessment and Plan: * Acute on chronic diastolic heart failure (HCC) Echocardiogram with preserved LV systolic function with EF 70 to 75%, mild LVH, diastolic parameters normal, RV systolic function preserved, RVSP 38,5 mmHg, no significant valvular disease.   Urine output 3,750 ml Systolic blood pressure 90 to 899 mmHg.   Patient had 120 mg IV furosemide  this morning.  Continue spironolactone  50 mg po daily.  Metoprolol  tartrate 50 mg q 6 hrs   Paroxysmal atrial fibrillation (HCC) Patient continue in atrial fibrillation rhythm, rate 90 to 100 bpm  Plan to continue  with metoprolol  tartrate 50 mg q 6 hrs Wean off diltiazem  to heart rate less than 120 bpm.  Apparently she has not tolerated po diltiazem  in the past due to edema.  Consult EP, apparently she was scheduled to start antiarrhythmic therapy.   Continue telemetry monitoring Anticoagulation with apixaban .  Keep K at 4 and Mg at 2.   Hypertension Continue blood pressure monitoring Continue metoprolol  for now.   Hypokalemia Renal function with serum cr at 0,91 with K at 3,2 and serum bicarbonate at 29  Na 140 and Mg 1,8   Plan to add Kcl 40 meq x2 and 2 g mag sulfate Follow up renal function in am Continue spironolactone .   Type 2 diabetes mellitus without complication, without long-term current use of insulin  (HCC) Continue insulin  sliding scale for glucose cover and monitoring Fasting glucose this am 161 mg/dl   Asthma No signs of acute exacerbation.  Continue bronchodilator therapy   OSA on CPAP Cpap   Iron  deficiency anemia Cell count has been stable.  Follow up as outpatient  Obesity, class 3 (HCC) Calculated BMI is 65,4         Subjective: Patient is feeling better dyspnea has improved, no palpitations and no lower extremity edema, no PND or orthopnea   Physical Exam: Vitals:   12/13/23 0500 12/13/23 0657 12/13/23 0658 12/13/23 0810  BP:  105/68 105/68 95/75  Pulse: (!) 145  99 95  Resp:    18  Temp:    97.9 F (36.6 C)  TempSrc:    Oral  SpO2: 96% 100% 98% 98%  Weight: (!) 178.3  kg     Height:       Neurology awake and alert ENT with mild pallor with no icterus Cardiovascular with S1 and S2 present, irregularly irregular with no gallops, rubs or murmurs Respiratory with no rales or wheezing, no rhonchi, poor inspiratory effort and distant breath sounds Abdomen protuberant, not distended and not tender No lower extremity at the ankles.   Data Reviewed:    Family Communication: no family at the bedside   Disposition: Status is: Observation The  patient will require care spanning > 2 midnights and should be moved to inpatient because: IV diltiazem    Planned Discharge Destination: Home     Author: Elidia Toribio Furnace, MD 12/13/2023 8:56 AM  For on call review [email or web address].

## 2023-12-13 NOTE — Assessment & Plan Note (Signed)
 Calculated BMI is 65,4

## 2023-12-13 NOTE — Assessment & Plan Note (Signed)
 Continue insulin  sliding scale for glucose cover and monitoring Fasting glucose this am 177 mg/dl

## 2023-12-13 NOTE — Assessment & Plan Note (Signed)
 Patient continue in atrial fibrillation rhythm, rate 90 to 100 bpm  Plan to continue with metoprolol  tartrate 50 mg q 6 hrs Wean off diltiazem  to heart rate less than 120 bpm.  Apparently she has not tolerated po diltiazem  in the past due to edema.  Consult EP, apparently she was scheduled to start antiarrhythmic therapy.   Continue telemetry monitoring Anticoagulation with apixaban .  Keep K at 4 and Mg at 2.

## 2023-12-13 NOTE — Assessment & Plan Note (Signed)
 Cell count has been stable.  Follow up as outpatient.

## 2023-12-13 NOTE — Assessment & Plan Note (Signed)
 Cpap

## 2023-12-13 NOTE — Assessment & Plan Note (Signed)
 Continue blood pressure monitoring Continue metoprolol  for now.

## 2023-12-13 NOTE — Assessment & Plan Note (Signed)
 Renal function with serum cr at 0,91 with K at 3,2 and serum bicarbonate at 29  Na 140 and Mg 1,8   Plan to add Kcl 40 meq x2 and 2 g mag sulfate Follow up renal function in am Continue spironolactone .

## 2023-12-13 NOTE — Progress Notes (Signed)
   12/13/23 2206  BiPAP/CPAP/SIPAP  Reason BIPAP/CPAP not in use (S)  Other(comment) (device currently unavailable in department supply, will setup as soon as device becomes available)

## 2023-12-13 NOTE — Assessment & Plan Note (Signed)
 Echocardiogram with preserved LV systolic function with EF 70 to 75%, mild LVH, diastolic parameters normal, RV systolic function preserved, RVSP 38,5 mmHg, no significant valvular disease.   Urine output 6,250  ml Systolic blood pressure 100 mmHg range   Improved volume status.  Will hold on loop diuretic and metolazone  for now.  Continue spironolactone  50 mg po daily.  Metoprolol  tartrate 50 mg q 6 hrs

## 2023-12-13 NOTE — Assessment & Plan Note (Signed)
No signs of acute exacerbation. Continue bronchodilator therapy.

## 2023-12-13 NOTE — Progress Notes (Signed)
 Progress Note  Patient Name: Alyssa Blanchard Date of Encounter: 12/13/2023  Primary Cardiologist: Annabella Scarce, MD   Subjective   Alyssa Blanchard is a 53 y.o. female with a hx of HFpEF, paroxysmal atrial fibrillation, type 2 diabetes, OSA, obesity status post gastric bypass who is being seen 12/12/2023 for the evaluation of atrial fibrillation with rapid ventricular response at the request of Randol Simmonds. AF apparently started Friday with rates as high as 200+ and took two flecainide  doses. Managed with diltiazem  in the ED with improved rates.  Had diaphoresis, palpitations, chest tightness. She has followed with EP outpatient for antiarrhythmic management and a repeat trial of Tikosyn  was discussed. Of note, has a planned dental extraction for dental caries with plan for dentures in the future.   Feeling a bit better today and breathing comfortably. Says that she was planning on doing a Tikosyn  load at the end of October but is hoping to do it while she's here.   Inpatient Medications    Scheduled Meds:  apixaban   5 mg Oral BID   budesonide -glycopyrrolate -formoterol   2 puff Inhalation BID   Chlorhexidine  Gluconate Cloth  6 each Topical Daily   insulin  aspart  0-5 Units Subcutaneous QHS   insulin  aspart  0-6 Units Subcutaneous TID WC   Lifitegrast   1 drop Both Eyes BID   melatonin  10 mg Oral QHS   metoprolol  tartrate  50 mg Oral Q6H   pantoprazole   40 mg Oral BID AC   pregabalin   300 mg Oral BID   sodium chloride  flush  10-40 mL Intracatheter Q12H   sodium chloride  flush  3 mL Intravenous Q12H   spironolactone   50 mg Oral Daily   Continuous Infusions:  diltiazem  (CARDIZEM ) infusion 7.5 mg/hr (12/13/23 1053)   PRN Meds: acetaminophen  **OR** acetaminophen , albuterol , polyethylene glycol, prochlorperazine , traMADol   Vital Signs    Vitals:   12/13/23 0657 12/13/23 0658 12/13/23 0810 12/13/23 1113  BP: 105/68 105/68 95/75 96/66   Pulse:  99 95 (!) 127  Resp:   18    Temp:   97.9 F (36.6 C)   TempSrc:   Oral   SpO2: 100% 98% 98% 96%  Weight:      Height:        Intake/Output Summary (Last 24 hours) at 12/13/2023 1225 Last data filed at 12/13/2023 0900 Gross per 24 hour  Intake 1172.81 ml  Output 3750 ml  Net -2577.19 ml   Filed Weights   12/13/23 0500  Weight: (!) 178.3 kg    Telemetry    Atrial fibrillation - Personally Reviewed  ECG    Afib with RVR - Personally Reviewed  Physical Exam   GEN: No acute distress.   Neck: No JVD Cardiac: regular rhythm, normal rate, no murmurs, rubs, or gallops.  Respiratory: Clear to auscultation bilaterally. GI: Soft, nontender, non-distended  MS: No edema; No deformity. Neuro:  Nonfocal  Psych: Normal affect   Labs    Chemistry Recent Labs  Lab 12/07/23 1431 12/12/23 1721 12/13/23 0532  NA 138 139 140  K 4.2 3.7 3.2*  CL 103 104 100  CO2 22 24 29   GLUCOSE 111* 109* 161*  BUN 8 14 11   CREATININE 0.75 <0.30* 0.91  CALCIUM 9.0 8.7* 8.2*  GFRNONAA  --  NOT CALCULATED >60  ANIONGAP  --  11 11     Hematology Recent Labs  Lab 12/07/23 1431 12/12/23 1721 12/13/23 0532  WBC 8.0 8.3 10.0  RBC 4.68 4.52 3.97  HGB 11.7 11.6* 10.4*  HCT 39.8 39.7 34.1*  MCV 85 87.8 85.9  MCH 25.0* 25.7* 26.2  MCHC 29.4* 29.2* 30.5  RDW 16.6* 18.3* 18.5*  PLT 203 186 195    Cardiac EnzymesNo results for input(s): TROPONINI in the last 168 hours. No results for input(s): TROPIPOC in the last 168 hours.   BNP Recent Labs  Lab 12/12/23 2340  BNP 205.5*     DDimer No results for input(s): DDIMER in the last 168 hours.   Radiology    DG Chest 1 View Result Date: 12/12/2023 CLINICAL DATA:  Atrial fibrillation. EXAM: CHEST  1 VIEW COMPARISON:  11/11/2023 FINDINGS: Lung volumes are low. Prominent heart size, likely accentuated by technique and not significantly changed. Bibasilar atelectasis. Left perihilar scarring. No pulmonary edema, large pleural effusion or pneumothorax.  IMPRESSION: Low lung volumes with bibasilar atelectasis. Electronically Signed   By: Andrea Gasman M.D.   On: 12/12/2023 18:00    Cardiac Studies   Echo 04/24/23   1. Left ventricular ejection fraction, by estimation, is 70 to 75%. The  left ventricle has hyperdynamic function. The left ventricle has no  regional wall motion abnormalities. There is mild left ventricular  hypertrophy. Left ventricular diastolic  parameters were normal (medial e' 9.7 cm/s).   2. Right ventricular systolic function is normal. The right ventricular  size is normal. There is mildly elevated pulmonary artery systolic  pressure. The estimated right ventricular systolic pressure is 38.5 mmHg.   3. The mitral valve is grossly normal. Trivial mitral valve  regurgitation. No evidence of mitral stenosis.   4. The aortic valve was not well visualized. There is mild calcification  of the aortic valve. Aortic valve regurgitation is not visualized. No  aortic stenosis is present.   5. The inferior vena cava is dilated in size with >50% respiratory  variability, suggesting right atrial pressure of 8 mmHg.   6. Increased flow velocities may be secondary to anemia, thyrotoxicosis,  hyperdynamic or high flow state.     Assessment & Plan   Principal Problem:   Acute on chronic diastolic heart failure (HCC) Active Problems:   Paroxysmal atrial fibrillation (HCC)   Iron  deficiency anemia   Type 2 diabetes mellitus without complication, without long-term current use of insulin  (HCC)   Asthma   Hypertension   Hypokalemia   Obesity, class 3 (HCC)   OSA on CPAP    Acute on chronic diastolic HF, appears at/near euvolemia Given lasix  120 mg x 1 with good output  On bumex  3 mg daily and metolazone  5 mg three times weekly Will change diuretics to home regimen Paroxysmal atrial fibrillation with RVR Eliquis  5 mg BID, Lopressor  50 mg Q6h and a cardizem  drip @ 7.5  Will consult EP to discuss possible inpatient  tikosyn  load and cardioversion Morbid obesity On Zepbound. This will likely delay a cardioversion depending on her last dose.  For questions or updates, please contact [facility] Please consult [email or web address] for contact info under        Signed, Emeline FORBES Calender, MD  12/13/2023, 12:25 PM

## 2023-12-14 ENCOUNTER — Telehealth (HOSPITAL_COMMUNITY): Payer: Self-pay | Admitting: Pharmacy Technician

## 2023-12-14 ENCOUNTER — Other Ambulatory Visit (HOSPITAL_COMMUNITY): Payer: Self-pay

## 2023-12-14 ENCOUNTER — Other Ambulatory Visit (HOSPITAL_COMMUNITY)

## 2023-12-14 DIAGNOSIS — E66813 Obesity, class 3: Secondary | ICD-10-CM

## 2023-12-14 DIAGNOSIS — I480 Paroxysmal atrial fibrillation: Secondary | ICD-10-CM | POA: Diagnosis not present

## 2023-12-14 DIAGNOSIS — I10 Essential (primary) hypertension: Secondary | ICD-10-CM | POA: Diagnosis not present

## 2023-12-14 DIAGNOSIS — E876 Hypokalemia: Secondary | ICD-10-CM | POA: Diagnosis not present

## 2023-12-14 DIAGNOSIS — I4819 Other persistent atrial fibrillation: Secondary | ICD-10-CM

## 2023-12-14 DIAGNOSIS — I5033 Acute on chronic diastolic (congestive) heart failure: Secondary | ICD-10-CM | POA: Diagnosis not present

## 2023-12-14 DIAGNOSIS — G4733 Obstructive sleep apnea (adult) (pediatric): Secondary | ICD-10-CM

## 2023-12-14 LAB — CBC
HCT: 35 % — ABNORMAL LOW (ref 36.0–46.0)
Hemoglobin: 10.6 g/dL — ABNORMAL LOW (ref 12.0–15.0)
MCH: 25.9 pg — ABNORMAL LOW (ref 26.0–34.0)
MCHC: 30.3 g/dL (ref 30.0–36.0)
MCV: 85.6 fL (ref 80.0–100.0)
Platelets: 189 K/uL (ref 150–400)
RBC: 4.09 MIL/uL (ref 3.87–5.11)
RDW: 18.3 % — ABNORMAL HIGH (ref 11.5–15.5)
WBC: 8.6 K/uL (ref 4.0–10.5)
nRBC: 0 % (ref 0.0–0.2)

## 2023-12-14 LAB — BASIC METABOLIC PANEL WITH GFR
Anion gap: 10 (ref 5–15)
Anion gap: 11 (ref 5–15)
Anion gap: 12 (ref 5–15)
BUN: 10 mg/dL (ref 6–20)
BUN: 12 mg/dL (ref 6–20)
BUN: 12 mg/dL (ref 6–20)
CO2: 26 mmol/L (ref 22–32)
CO2: 29 mmol/L (ref 22–32)
CO2: 29 mmol/L (ref 22–32)
Calcium: 8.3 mg/dL — ABNORMAL LOW (ref 8.9–10.3)
Calcium: 8.9 mg/dL (ref 8.9–10.3)
Calcium: 9.1 mg/dL (ref 8.9–10.3)
Chloride: 98 mmol/L (ref 98–111)
Chloride: 97 mmol/L — ABNORMAL LOW (ref 98–111)
Chloride: 93 mmol/L — ABNORMAL LOW (ref 98–111)
Creatinine, Ser: 0.89 mg/dL (ref 0.44–1.00)
Creatinine, Ser: 1.01 mg/dL — ABNORMAL HIGH (ref 0.44–1.00)
Creatinine, Ser: 0.94 mg/dL (ref 0.44–1.00)
GFR, Estimated: >60 mL/min (ref >60)
GFR, Estimated: >60 mL/min (ref >60)
GFR, Estimated: >60 mL/min (ref >60)
Glucose, Bld: 177 mg/dL — ABNORMAL HIGH (ref 70–99)
Glucose, Bld: 141 mg/dL — ABNORMAL HIGH (ref 70–99)
Glucose, Bld: 111 mg/dL — ABNORMAL HIGH (ref 70–99)
Potassium: 3.4 mmol/L — ABNORMAL LOW (ref 3.5–5.1)
Potassium: 3.7 mmol/L (ref 3.5–5.1)
Potassium: 3.8 mmol/L (ref 3.5–5.1)
Sodium: 134 mmol/L — ABNORMAL LOW (ref 135–145)
Sodium: 137 mmol/L (ref 135–145)
Sodium: 134 mmol/L — ABNORMAL LOW (ref 135–145)

## 2023-12-14 LAB — MAGNESIUM: Magnesium: 1.9 mg/dL (ref 1.7–2.4)

## 2023-12-14 LAB — GLUCOSE, CAPILLARY
Glucose-Capillary: 127 mg/dL — ABNORMAL HIGH (ref 70–99)
Glucose-Capillary: 155 mg/dL — ABNORMAL HIGH (ref 70–99)
Glucose-Capillary: 131 mg/dL — ABNORMAL HIGH (ref 70–99)
Glucose-Capillary: 160 mg/dL — ABNORMAL HIGH (ref 70–99)

## 2023-12-14 MED ORDER — POTASSIUM CHLORIDE CRYS ER 20 MEQ PO TBCR
40.0000 meq | EXTENDED_RELEASE_TABLET | ORAL | Status: AC
Start: 2023-12-14 — End: 2023-12-14
  Administered 2023-12-14 (×2): 40 meq via ORAL
  Filled 2023-12-14 (×2): qty 2

## 2023-12-14 MED ORDER — SODIUM CHLORIDE 0.9 % IV SOLN: 250.0000 mL | INTRAVENOUS | Status: AC | PRN

## 2023-12-14 MED ORDER — SODIUM CHLORIDE 0.9% FLUSH: 3.0000 mL | INTRAVENOUS | Status: DC | PRN

## 2023-12-14 MED ORDER — SODIUM CHLORIDE 0.9% FLUSH
3.0000 mL | Freq: Two times a day (BID) | INTRAVENOUS | Status: DC
  Administered 2023-12-15 – 2023-12-17 (×6): 3 mL via INTRAVENOUS

## 2023-12-14 MED ORDER — MAGNESIUM SULFATE 2 GM/50ML IV SOLN
2.0000 g | Freq: Once | INTRAVENOUS | Status: AC
  Administered 2023-12-14: 2 g via INTRAVENOUS
  Filled 2023-12-14: qty 50

## 2023-12-14 MED ORDER — POTASSIUM CHLORIDE CRYS ER 20 MEQ PO TBCR
40.0000 meq | EXTENDED_RELEASE_TABLET | Freq: Once | ORAL | Status: AC
  Administered 2023-12-14: 40 meq via ORAL
  Filled 2023-12-14: qty 2

## 2023-12-14 MED ORDER — LIFITEGRAST 5 % OP SOLN
1.0000 [drp] | Freq: Two times a day (BID) | OPHTHALMIC | Status: DC
  Administered 2023-12-15 – 2023-12-17 (×5): 1 [drp] via OPHTHALMIC

## 2023-12-14 MED ORDER — DOFETILIDE 500 MCG PO CAPS
500.0000 ug | ORAL_CAPSULE | Freq: Two times a day (BID) | ORAL | Status: DC
  Administered 2023-12-14 – 2023-12-17 (×6): 500 ug via ORAL
  Filled 2023-12-14 (×6): qty 1

## 2023-12-14 NOTE — Progress Notes (Signed)
 Progress Note  Patient Name: Alyssa Blanchard Date of Encounter: 12/14/2023  Primary Cardiologist: Annabella Scarce, MD   Subjective   No complaints. Was to come in latter this month for Tikosyn  load. Admitted with rapid afib. Started Friday She took two pill in pocket flecainide  with no converstion. On cardizem  drip with good rate control    Inpatient Medications    Scheduled Meds:  apixaban   5 mg Oral BID   budesonide -glycopyrrolate -formoterol   2 puff Inhalation BID   bumetanide   3 mg Oral Daily   Chlorhexidine  Gluconate Cloth  6 each Topical Daily   insulin  aspart  0-5 Units Subcutaneous QHS   insulin  aspart  0-6 Units Subcutaneous TID WC   Lifitegrast   1 drop Both Eyes BID   loratadine   10 mg Oral Daily   melatonin  10 mg Oral QHS   metolazone   5 mg Oral Once per day on Monday Wednesday Friday   metoprolol  tartrate  50 mg Oral Q6H   pantoprazole   40 mg Oral BID AC   pregabalin   300 mg Oral BID   sodium chloride  flush  10-40 mL Intracatheter Q12H   sodium chloride  flush  3 mL Intravenous Q12H   spironolactone   50 mg Oral Daily   Continuous Infusions:  diltiazem  (CARDIZEM ) infusion 5 mg/hr (12/14/23 0612)   PRN Meds: acetaminophen  **OR** acetaminophen , albuterol , polyethylene glycol, prochlorperazine , traMADol   Vital Signs    Vitals:   12/13/23 1955 12/13/23 2316 12/14/23 0433 12/14/23 0627  BP:  108/62 98/67 90/60   Pulse:  92 81 87  Resp:  20 15   Temp:  98.5 F (36.9 C) 98.1 F (36.7 C)   TempSrc:  Oral Oral   SpO2: 97% 96% 97% 97%  Weight:      Height:        Intake/Output Summary (Last 24 hours) at 12/14/2023 0751 Last data filed at 12/14/2023 9387 Gross per 24 hour  Intake 2380.7 ml  Output 3400 ml  Net -1019.3 ml   Filed Weights   12/13/23 0500  Weight: (!) 178.3 kg    Telemetry    Atrial fibrillation - Personally Reviewed  ECG    Afib with RVR - Personally Reviewed  Physical Exam   Affect appropriate Healthy:  appears stated  age HEENT: normal Neck supple with no adenopathy JVP normal no bruits no thyromegaly Lungs clear with no wheezing and good diaphragmatic motion Heart:  S1/S2 no murmur, no rub, gallop or click PMI normal Abdomen: benighn, BS positve, no tenderness, no AAA no bruit.  No HSM or HJR Distal pulses intact with no bruits No edema Neuro non-focal Skin warm and dry No muscular weakness   Labs    Chemistry Recent Labs  Lab 12/07/23 1431 12/12/23 1721 12/13/23 0532  NA 138 139 140  K 4.2 3.7 3.2*  CL 103 104 100  CO2 22 24 29   GLUCOSE 111* 109* 161*  BUN 8 14 11   CREATININE 0.75 <0.30* 0.91  CALCIUM 9.0 8.7* 8.2*  GFRNONAA  --  NOT CALCULATED >60  ANIONGAP  --  11 11     Hematology Recent Labs  Lab 12/07/23 1431 12/12/23 1721 12/13/23 0532  WBC 8.0 8.3 10.0  RBC 4.68 4.52 3.97  HGB 11.7 11.6* 10.4*  HCT 39.8 39.7 34.1*  MCV 85 87.8 85.9  MCH 25.0* 25.7* 26.2  MCHC 29.4* 29.2* 30.5  RDW 16.6* 18.3* 18.5*  PLT 203 186 195    Cardiac EnzymesNo results for input(s): TROPONINI in  the last 168 hours. No results for input(s): TROPIPOC in the last 168 hours.   BNP Recent Labs  Lab 12/12/23 2340  BNP 205.5*     DDimer No results for input(s): DDIMER in the last 168 hours.   Radiology    DG Chest 1 View Result Date: 12/12/2023 CLINICAL DATA:  Atrial fibrillation. EXAM: CHEST  1 VIEW COMPARISON:  11/11/2023 FINDINGS: Lung volumes are low. Prominent heart size, likely accentuated by technique and not significantly changed. Bibasilar atelectasis. Left perihilar scarring. No pulmonary edema, large pleural effusion or pneumothorax. IMPRESSION: Low lung volumes with bibasilar atelectasis. Electronically Signed   By: Andrea Gasman M.D.   On: 12/12/2023 18:00    Cardiac Studies   Echo 04/24/23   1. Left ventricular ejection fraction, by estimation, is 70 to 75%. The  left ventricle has hyperdynamic function. The left ventricle has no  regional wall motion  abnormalities. There is mild left ventricular  hypertrophy. Left ventricular diastolic  parameters were normal (medial e' 9.7 cm/s).   2. Right ventricular systolic function is normal. The right ventricular  size is normal. There is mildly elevated pulmonary artery systolic  pressure. The estimated right ventricular systolic pressure is 38.5 mmHg.   3. The mitral valve is grossly normal. Trivial mitral valve  regurgitation. No evidence of mitral stenosis.   4. The aortic valve was not well visualized. There is mild calcification  of the aortic valve. Aortic valve regurgitation is not visualized. No  aortic stenosis is present.   5. The inferior vena cava is dilated in size with >50% respiratory  variability, suggesting right atrial pressure of 8 mmHg.   6. Increased flow velocities may be secondary to anemia, thyrotoxicosis,  hyperdynamic or high flow state.     Assessment & Plan   Principal Problem:   Acute on chronic diastolic heart failure (HCC) Active Problems:   Paroxysmal atrial fibrillation (HCC)   Iron  deficiency anemia   Type 2 diabetes mellitus without complication, without long-term current use of insulin  (HCC)   Asthma   Hypertension   Hypokalemia   Obesity, class 3 (HCC)   Rapid atrial fibrillation (HCC)   OSA on CPAP    Acute on chronic diastolic HF, appears at/near euvolemia Given lasix  120 mg x 1 with good output  On bumex  3 mg daily and metolazone  5 mg three times weekly Continue home regimen diuretics Paroxysmal atrial fibrillation with RVR Eliquis  5 mg BID, Lopressor  50 mg Q6h and a cardizem  drip @ 7.5  Have notified EP Dr Nancey in hospital to start Tikosyn  orders  Morbid obesity On Zepbound. This will likely delay a cardioversion depending on her last dose.  For questions or updates, please contact [facility] Please consult [email or web address] for contact info under        Signed, Maude Emmer, MD  12/14/2023, 7:51 AM

## 2023-12-14 NOTE — Telephone Encounter (Signed)
 Pt requesting a update.

## 2023-12-14 NOTE — Telephone Encounter (Signed)
 Patient Product/process development scientist completed.    The patient is insured through [facility] MEDICAID.     Ran test claim for dofetilide  (Tikosyn ) 500 mcg and the current 30 day co-pay is $4.00.   This test claim was processed through [facility]- copay amounts may vary at other pharmacies due to pharmacy/plan contracts, or as the patient moves through the different stages of their insurance plan.     Reyes Sharps, CPHT Pharmacy Technician Patient Advocate Specialist Lead [facility] Patient Advocate Team Direct Number: [phone]  Fax: [phone]

## 2023-12-14 NOTE — Progress Notes (Signed)
 Pharmacy: Dofetilide  (Tikosyn ) - Initial Consult Assessment and Electrolyte Replacement  Pharmacy consulted to assist in monitoring and replacing electrolytes in this 53 y.o. female admitted on 12/12/2023 undergoing dofetilide  initiation.   Assessment:  Patient Exclusion Criteria: If any screening criteria checked as Yes, then  patient  should NOT receive dofetilide  until criteria item is corrected.  If "Yes" please indicate correction plan.  YES  NO Patient  Exclusion Criteria Correction Plan/Comments   []   [x]   Baseline QTc interval is greater than or equal to 440 msec. IF above YES box checked dofetilide  contraindicated unless patient has ICD; then may proceed if QTc 500-550 msec or with known ventricular conduction abnormalities may proceed with QTc 550-600 msec. QTc = 430-440 per EP note    []   [x]   Patient is known or suspected to have a digoxin level greater than 2 ng/ml: No results found for: DIGOXIN     []   [x]   Creatinine clearance less than 20 ml/min (calculated using Cockcroft-Gault, actual body weight and serum creatinine): Estimated Creatinine Clearance: 121.7 mL/min (by C-G formula based on SCr of 0.89 mg/dL).     []   [x]  Patient has received drugs known to prolong the QT intervals within the last 48 hour (examples: phenothiazines, tricyclics or tetracyclic antidepressants, macrolides, 1st generation H-1 antihistamines (especially diphenhydramine ), fluoroquinolones, azoles, ondansetron , metoclopramide, promethazine ).   Updated information on QT prolonging agents is available to be searched on the following database:QT prolonging agents -If SSRI or antihistamine needed, preferred options are sertraline and loratadine  respectively  Tramadol with possible QTc risk.  -No contraindication. Would continue Tikosyn  with close monitoring   []   [x]  Patient received a dose of a thiazide diuretic in the last 48 hours [including hydrochlorothiazide (Oretic) alone or in any  combination including triamterene (Dyazide, Maxzide)].    []   []  Patient received a medication known to increase dofetilide  plasma concentrations prior to initial dofetilide  dose:  Trimethoprim (Primsol, Proloprim) in the last 36 hours Verapamil (Calan, Verelan) in the last 36 hours or a sustained release dose in the last 72 hours Megestrol (Megace) in the last 5 days  Cimetidine (Tagamet) in the last 6 hours Ketoconazole (Nizoral) in the last 24 hours Itraconazole (Sporanox) in the last 48 hours  Prochlorperazine  (Compazine ) in the last 36 hours     []   [x]   Patient is known to have a history of torsades de pointes; congenital or acquired long QT syndromes.    []   [x]   Patient has received a Class 1 and Class 3 antiarrhythmic with less than 2 half-lives since last dose. (Disopyramide, Quinidine, Procainamide, Lidocaine , Mexiletine, Flecainide , Propafenone, Sotalol, Dronedarone) Last Flecainide  given on 10/4 -half life ~ 12-27 hours -with renal function, starting Tikosyn  tonight is reasonable   []   [x]   Patient has received amiodarone therapy in the past 3 months or amiodarone level is greater than 0.3 ng/ml.      Plan: Select One Calculated CrCl  Dose q12h  [x]  > 60 ml/min 500 mcg  []  40-60 ml/min 250 mcg  []  20-40 ml/min 125 mcg   [x]   Physician selected initial dose within range recommended for patients level of renal function - will monitor for response.  []   Physician selected initial dose outside of range recommended for patients level of renal function - will discuss if the dose should be altered at this time.   Patient has been appropriately anticoagulated with apixaban .  Potassium: -K= 3.7 (up from 3.4). She is already ordered another 40meq  Magnesium : Mg 1.8-2: Give Mg 2 gm IV x1 to prevent Mg from dropping below 1.8 - do not need to recheck Mg. Appropriate to initiate Tikosyn    Thank you for allowing pharmacy to participate in this patient's care   [name], PharmD Clinical Pharmacist **Pharmacist phone directory can now be found on [email or web address] ([email or web address]).  Listed under [facility].

## 2023-12-14 NOTE — Progress Notes (Addendum)
 Progress Note   Patient: Alyssa Blanchard FMW:[id number] DOB: 30-Nov-1970 DOA: 12/12/2023     1 DOS: the patient was seen and examined on 12/14/2023   Brief hospital course: [name] was admitted to the hospital with the working diagnosis of atrial fibrillation.   53 yo female with the past medical history of hypertension, T2DM, asthma, depression, anxiety and obesity who presented with palpitations. Reported intermittent palpitations for the last 2 months, but more severe symptoms over the last 24 hrs prior to admission, with heart rate as high as 220 bpm. Palpitations associated with dyspnea, dizziness and diaphoresis. Symptoms persistent despite taking antiarrhythmic and AV blocker at home.  On her initial physical examination her blood pressure was 120/59, HR 120, and 02 saturation 96% Lungs with no wheezing or rhonchi, heart with S1 and S2 present, irregularly irregular, with no gallops, rubs or murmurs, abdomen with no distention and positive lower extremity edema.   Na 139, K 3.7 Cl 104 bicarbonate 24 glucose 109, bun 14 cr <0.30 Mg 1,7  BNP 205  Wbc 8,3 hgb 11.6 plt 186   Chest radiograph with cardiomegaly, bilateral hilar vascular congestion with no infiltrates or effusions.  EKG 121 bpm, normal axis, normal intervals, qtc 484, atrial fibrillation rhythm with no significant ST segment or T wave changes.   Patient was placed on higher dose of metoprolol  and IV diltiazem  drip for rate control.  10/06 wean off diltiazem , consulted EP.  Assessment and Plan: * Acute on chronic diastolic heart failure (HCC) Echocardiogram with preserved LV systolic function with EF 70 to 75%, mild LVH, diastolic parameters normal, RV systolic function preserved, RVSP 38,5 mmHg, no significant valvular disease.   Urine output 3,400 ml Systolic blood pressure 90 to 899 mmHg.   Improved volume status.  Continue bumetanide  3 mg daily She on metolazone  3 times per week, for now will plan to start on  Wednesday.  Continue spironolactone  50 mg po daily.  Metoprolol  tartrate 50 mg q 6 hrs   Paroxysmal atrial fibrillation (HCC) Patient continue in atrial fibrillation rhythm, rate 100 to 110 bpm, personally reviewed telemetry  Plan to continue with metoprolol  tartrate 50 mg q 6 hrs Wean off diltiazem .   Apparently she has not tolerated po diltiazem  in the past due to edema.  Follow EP recommendations for antiarrhythmic therapy.   Continue telemetry monitoring Anticoagulation with apixaban .  Keep K at 4 and Mg at 2.   Hypertension Continue blood pressure monitoring Continue metoprolol  for now.   Hypokalemia Hyponatremia,.   Today renal function with serum cr at 0,89, K at 3.4 and serum bicarbonate at 26  Na 134  Pending Mg.   Plan to add Kcl 40 meq x2  Follow up renal function in am Continue spironolactone , resume bumetanide .   Type 2 diabetes mellitus without complication, without long-term current use of insulin  (HCC) Continue insulin  sliding scale for glucose cover and monitoring Fasting glucose this am 177 mg/dl   Asthma No signs of acute exacerbation.  Continue bronchodilator therapy   OSA on CPAP Cpap   Iron  deficiency anemia Cell count has been stable.  Follow up as outpatient  Obesity, class 3 (HCC) Calculated BMI is 65,4         Subjective: Patient is feeling better today with no chest pain and no dyspnea, no PND or orthopnea. Positive headache.   Physical Exam: Vitals:   12/14/23 0627 12/14/23 0733 12/14/23 0800 12/14/23 0833  BP: 90/60 99/72  104/82  Pulse: 87 86  84  Resp:  15    Temp:  98 F (36.7 C)    TempSrc:  Oral    SpO2: 97% 96% 97% 95%  Weight:      Height:       Neurology awake and alert ENT with mild pallor with no icterus Cardiovascular with S1 and S2 present, distant heart sounds, irregularly irregular with no gallops, rubs or murmurs Wide neck with no JVD Respiratory with no rales or wheezing, no rhonchi  Abdomen  protuberant, not distended and not tender No ankle edema.  Data Reviewed:    Family Communication: no family at the bedside   Disposition: Status is: Inpatient Remains inpatient appropriate because: atrial fibrillation   Planned Discharge Destination: Home     Author: Elidia Toribio Furnace, MD 12/14/2023 8:55 AM  For on call review [email or web address].

## 2023-12-14 NOTE — Clinical Note (Incomplete)
 [email or web address]

## 2023-12-14 NOTE — Telephone Encounter (Signed)
 Patient with diagnosis of afib on Eliquis  for anticoagulation.    Procedure: Dental Extraction - Amount of Teeth to be Pulled:  x 11 teeth with alveoloplasty  Date of procedure: TBD   CHA2DS2-VASc Score = 4   This indicates a 4.8% annual risk of stroke. The patient's score is based upon: CHF History: 1 HTN History: 1 Diabetes History: 1 Stroke History: 0 Vascular Disease History: 0 Age Score: 0 Gender Score: 1      CrCl > 100 ml/min  Patient is currently admitted for afib (unplanned) and was also pending Tikosyn  load Patient will need to wait 4 weeks post cardioversion (either DCCV or medically) before holding Eliquis .  Once her 4 weeks are completed then she can hold Eliquis  for 1 day prior to dental procedure.   **This guidance is not considered finalized until pre-operative APP has relayed final recommendations.**

## 2023-12-14 NOTE — Progress Notes (Signed)
 Morning Metoprolol  held for BP per MD

## 2023-12-14 NOTE — Consult Note (Signed)
 ELECTROPHYSIOLOGY CONSULT NOTE    Patient ID: Alyssa Blanchard MRN: [id number], DOB/AGE: 53-May-1972 53 y.o.  Admit date: 12/12/2023 Date of Consult: 12/14/2023  Primary Physician: Nikki Rams, Aliene, MD Primary Cardiologist: Annabella Scarce, MD  Electrophysiologist: Dr. Nancey   Referring Provider: Dr. Delford  Patient Profile: Alyssa Blanchard is a 53 y.o. female with a history of HFpEF, paroxysmal atrial fibrillation, type 2 diabetes, OSA, obesity status post gastric bypas who is being seen today for the evaluation of recurrent AF at the request of Dr. Delford.  HPI:  Alyssa Blanchard is a 53 y.o. female known to the EP team with plans for Tikosyn  admission later this month.   Was previously on flecainide  50 mg in 2022 and had breakthrough. Was started on Tikosyn  at [facility] and it worked well. She went on a trip and left her meds behind, and did not want to be re-admitted.   Last week had recurrent of AF with reported HRs of as high as 225.  She took two pills of flecainide  with improved rates, but continued to have diaphoresis, palpitations, and chest pressure -> reported to ED.  Started on IV diltiazem  and also given 120 mg of lasix  x 1 with good UOP.  Denies any missed doses of her Eliquis .   Chronically she takes bumex  3 mg daily with metolazone  5 mg on Mondays and Wednesdays, with 80 meq of K q am and 40 meq of K q pm, with 80 meq extra on metolazone  days. Also eats at least 1 banana daily.   Currently, she is feeling OK at rest.    Labs Potassium3.4* (10/06 0751) Magnesium   1.8 (10/05 0532) Creatinine, ser  0.89 (10/06 0751) PLT  189 (10/06 0751) HGB  10.6* (10/06 0751) WBC 8.6 (10/06 0751)  .    Past Medical History:  Diagnosis Date   Acute on chronic congestive heart failure (HCC) 05/02/2023   Anxiety    Asthma    Atrial fibrillation (HCC)    Depression    DM (diabetes mellitus), type 2 (HCC) 02/16/2022   Dysrhythmia    new onset Afib rvr   GERD  (gastroesophageal reflux disease)    Heart failure (HCC)    Heel spur    bilat   History of bronchitis    History of chicken pox    History of urinary tract infection    Hypertension    Migraines    Plantar fasciitis    bilat   STD (sexually transmitted disease)    chl hx & hsv 1&2   Tremors of nervous system    Urinary incontinence      Surgical History:  Past Surgical History:  Procedure Laterality Date   BIOPSY  10/14/2022   Procedure: BIOPSY;  Surgeon: Stacia Glendia BRAVO, MD;  Location: [facility];  Service: Gastroenterology;;   CARDIOVERSION N/A 08/05/2019   Procedure: CARDIOVERSION;  Surgeon: Alveta Aleene PARAS, MD;  Location: [facility] END[facility]COPY;  Service: Cardiovascular;  Laterality: N/A;   CARDIOVERSION N/A 04/28/2023   Procedure: CARDIOVERSION;  Surgeon: Jeffrie Oneil BROCKS, MD;  Location: [facility];  Service: Cardiovascular;  Laterality: N/A;   COLONOSCOPY WITH PROPOFOL  N/A 10/14/2022   Procedure: COLONOSCOPY WITH PROPOFOL ;  Surgeon: Stacia Glendia BRAVO, MD;  Location: [facility];  Service: Gastroenterology;  Laterality: N/A;   ESOPHAGOGASTRODUODENOSCOPY (EGD) WITH PROPOFOL  N/A 10/14/2022   Procedure: ESOPHAGOGASTRODUODENOSCOPY (EGD) WITH PROPOFOL ;  Surgeon: Stacia Glendia BRAVO, MD;  Location: [facility];  Service: Gastroenterology;  Laterality: N/A;  LAPAROSCOPIC GASTRIC SLEEVE RESECTION N/A 11/27/2014   Procedure: LAPAROSCOPIC GASTRIC SLEEVE RESECTION WITH HIATAL HERNIA REPAIR UPPER ENDOSCOPY;  Surgeon: Donnice Lunger, MD;  Location: [facility] ORS;  Service: General;  Laterality: N/A;   POLYPECTOMY  10/14/2022   Procedure: POLYPECTOMY;  Surgeon: Stacia Glendia BRAVO, MD;  Location: [facility];  Service: Gastroenterology;;   TEE WITHOUT CARDIOVERSION N/A 08/05/2019   Procedure: TRANSESOPHAGEAL ECHOCARDIOGRAM (TEE);  Surgeon: Alveta Aleene PARAS, MD;  Location: [facility] ENDOSCOPY;  Service: Cardiovascular;  Laterality: N/A;   TONSILLECTOMY  1978     Medications Prior to Admission   Medication Sig Dispense Refill Last Dose/Taking   acetaminophen  (TYLENOL ) 325 MG tablet Take 2 tablets (650 mg total) by mouth every 6 (six) hours as needed for mild pain (pain score 1-3) (or Fever >/= 101).   12/12/2023 Evening   albuterol  (PROVENTIL ) (2.5 MG/3ML) 0.083% nebulizer solution Take 3 mLs by nebulization every 4 (four) hours as needed for wheezing or shortness of breath. 90 mL 12 Unknown   albuterol  (VENTOLIN  HFA) 108 (90 Base) MCG/ACT inhaler Inhale 1 puff into the lungs every 6 (six) hours as needed for wheezing or shortness of breath.   12/12/2023 Evening   apixaban  (ELIQUIS ) 5 MG TABS tablet Take 1 tablet (5 mg total) by mouth 2 (two) times daily. 60 tablet 0 12/12/2023 at  8:00 AM   budesonide -glycopyrrolate -formoterol  (BREZTRI AEROSPHERE) 160-9-4.8 MCG/ACT AERO inhaler Inhale 2 puffs into the lungs 2 (two) times daily.   12/12/2023 Morning   bumetanide  (BUMEX ) 1 MG tablet Take 3 tablets (3 mg total) by mouth daily. 90 tablet 6 12/11/2023 Morning   cetirizine  (ZYRTEC ) 10 MG tablet Take 1 tablet (10 mg total) by mouth daily. 30 tablet 0 12/11/2023 Bedtime   clindamycin (CLEOCIN T) 1 % lotion Apply topically as needed.   12/11/2023 Bedtime   diclofenac  Sodium (VOLTAREN  ARTHRITIS PAIN) 1 % GEL Apply 2 g topically 2 (two) times daily. 50 g 0 12/12/2023 Morning   docusate sodium  (COLACE) 100 MG capsule Take 200 mg by mouth as needed for mild constipation.   Unknown   Dupilumab  (DUPIXENT ) 300 MG/2ML SOAJ Inject 300 mg into the skin every 14 (fourteen) days.   11/27/2023   flecainide  (TAMBOCOR ) 100 MG tablet Take 1 tablet by mouth twice daily ONLY AS NEEDED - max 2 tablets in a 24 hour period 10 tablet 0 12/12/2023 Morning   Lifitegrast  (XIIDRA ) 5 % SOLN Place 1 drop into both eyes in the morning and at bedtime.   12/12/2023 Morning   losartan  (COZAAR ) 25 MG tablet Take 1 tablet (25 mg total) by mouth daily. 90 tablet 3 12/12/2023 Morning   magnesium  oxide (MAG-OX) 400 MG tablet Take 1 tablet (400 mg  total) by mouth daily. 30 tablet 0 12/11/2023 Morning   melatonin 5 MG TABS Take 2 tablets (10 mg total) by mouth at bedtime. 60 tablet 0 12/11/2023 Bedtime   metolazone  (ZAROXOLYN ) 5 MG tablet Take 1 tablet (5 mg total) by mouth 3 (three) times a week. Take an extra 40mEq of potassium with Metolazone  (Patient taking differently: Take 5 mg by mouth 2 (two) times a week. Take an extra 40mEq of potassium with Metolazone ) 15 tablet 3 12/09/2023   metoprolol  succinate (TOPROL -XL) 25 MG 24 hr tablet Take 2 tablets (50 mg total) by mouth daily. (Patient taking differently: Take 25 mg by mouth 2 (two) times daily.) 180 tablet 3 12/12/2023 Morning   Multiple Vitamins-Minerals (BARIATRIC MULTIVITAMINS/IRON  PO) Take 1 tablet by mouth daily with breakfast.  12/12/2023 Morning   omeprazole (PRILOSEC) 20 MG capsule Take 20 mg by mouth 2 (two) times daily before a meal.   12/12/2023 Morning   polyethylene glycol (MIRALAX  / GLYCOLAX ) 17 g packet Take 17 g by mouth as needed.   Unknown   potassium chloride  SA (KLOR-CON  M) 20 MEQ tablet Take 4 tablets (80 mEq total) by mouth every morning AND 2 tablets (40 mEq total) every evening.   12/11/2023 Evening   pregabalin  (LYRICA ) 300 MG capsule Take 300 mg by mouth 2 (two) times daily.   12/12/2023 Morning   silver  sulfADIAZINE  (SILVADENE ) 1 % cream Apply topically daily. Apply to right side 50 g 0 12/11/2023 Bedtime   spironolactone  (ALDACTONE ) 25 MG tablet Take 2 tablets (50 mg total) by mouth daily. 90 tablet 3 12/12/2023 Morning   tirzepatide (ZEPBOUND) 10 MG/0.5ML Pen Inject 10 mg into the skin once a week. Thursday   12/10/2023    Inpatient Medications:   apixaban   5 mg Oral BID   budesonide -glycopyrrolate -formoterol   2 puff Inhalation BID   bumetanide   3 mg Oral Daily   Chlorhexidine  Gluconate Cloth  6 each Topical Daily   insulin  aspart  0-5 Units Subcutaneous QHS   insulin  aspart  0-6 Units Subcutaneous TID WC   Lifitegrast   1 drop Both Eyes BID   loratadine   10 mg  Oral Daily   melatonin  10 mg Oral QHS   metolazone   5 mg Oral Once per day on Monday Wednesday Friday   metoprolol  tartrate  50 mg Oral Q6H   pantoprazole   40 mg Oral BID AC   potassium chloride   40 mEq Oral Q4H   pregabalin   300 mg Oral BID   sodium chloride  flush  10-40 mL Intracatheter Q12H   sodium chloride  flush  3 mL Intravenous Q12H   spironolactone   50 mg Oral Daily    Allergies:  Allergies  Allergen Reactions   Gabapentin Anaphylaxis, Nausea And Vomiting and Other (See Comments)    Cannot walk   Topiramate  Other (See Comments)    Jaw tightness and chest discomfort    Albuterol  Other (See Comments)    CAN tolerate levalbuterol    Hydroxyzine  Other (See Comments)    QT prolongation related   Imitrex  [Sumatriptan ] Nausea And Vomiting and Other (See Comments)    Increased heart rate   Latex Itching   Metformin Other (See Comments)    Interaction with heart medications   Montelukast  Other (See Comments)    Nightmares    Other Itching and Other (See Comments)    Plastic and artificial Christmas trees = Itching   Prozac [Fluoxetine] Other (See Comments)    Bad, vivid dreams   Semaglutide  Nausea And Vomiting    nausea   Trazodone  And Nefazodone Other (See Comments)    Bad dreams 01/27/20 Pt is unsure if allergic to Nefazodone   Advair Diskus [Fluticasone -Salmeterol] Palpitations   Copper-Containing Compounds Rash and Other (See Comments)    Makes the face burn, also   Tape Rash    Redness and itching    Family History  Problem Relation Age of Onset   Diabetes Mother    Hypertension Mother    Hyperlipidemia Mother    Kidney disease Mother        CKD stage 4   Pancreatic cancer Father        pancreatic   Cancer Maternal Uncle        melanoma   Cancer Paternal Uncle  kidney   Hypertension Maternal Grandmother    Diabetes Maternal Grandmother    Heart failure Maternal Grandmother        CHF   Heart attack Maternal Grandfather    Hypertension  Maternal Grandfather    Hypertension Paternal Grandmother    Alzheimer's disease Paternal Grandmother    Hypertension Paternal Grandfather    Colon cancer Neg Hx    Esophageal cancer Neg Hx    Stomach cancer Neg Hx      Physical Exam: Vitals:   12/14/23 0627 12/14/23 0733 12/14/23 0800 12/14/23 0833  BP: 90/60 99/72  104/82  Pulse: 87 86  84  Resp:  15    Temp:  98 F (36.7 C)    TempSrc:  Oral    SpO2: 97% 96% 97% 95%  Weight:      Height:        GEN- NAD, A&O x 3, normal affect HEENT: Normocephalic, atraumatic Lungs- CTAB, Normal effort.  Heart- Irregularly irregular rate and rhythm, No M/G/R.  GI- Soft, NT, ND.  Extremities- No clubbing, cyanosis, or edema   Radiology/Studies: DG Chest 1 View Result Date: 12/12/2023 CLINICAL DATA:  Atrial fibrillation. EXAM: CHEST  1 VIEW COMPARISON:  11/11/2023 FINDINGS: Lung volumes are low. Prominent heart size, likely accentuated by technique and not significantly changed. Bibasilar atelectasis. Left perihilar scarring. No pulmonary edema, large pleural effusion or pneumothorax. IMPRESSION: Low lung volumes with bibasilar atelectasis. Electronically Signed   By: Andrea Gasman M.D.   On: 12/12/2023 18:00    EKG: on arrival showed AF RVR at 121 bpm (personally reviewed) Baseline EKG 08/24/2023 showed NSR with QT ~430-440  TELEMETRY: AF with variable rates 80-110s (personally reviewed)  Assessment/Plan:  Persistent Atrial fibrillation Previously was on lower dose flecainide  50 mg BID and had breakthroughs -> put on Tikosyn .  Santina out of town and left her meds behind and came off Tikosyn  EF 04/2023 LVEF 70-75% with normal RA and LA sizes Would prefer to avoid chronic high doses of flecainide .  Will work with Pharmacy to see if we can manage her Potassium well enough to consider Tikosyn .  Previously had K as low as 2.8 in the setting of subQ lasix .  Will start Tikosyn  initiation process with pharmacy.  Baseline QT 430-440  ms  Acute on chronic diastolic CHF Hypokalemia Takes bumex  3 mg daily Takes Metolazone  5 mg Monday and Wednesday with an EXTRA 80 meq of K On spiro 50 mg daily Takes potassium 80 meq q am and 40 meq q pm.   Dr. Nancey has seen.   For questions or updates, please contact [facility] Please consult [email or web address] for contact info under     Signed, Ozell Prentice Passey, PA-C  12/14/2023, 9:01 AM

## 2023-12-14 NOTE — Progress Notes (Addendum)
 Pharmacy: Dofetilide  (Tikosyn ) - Follow Up Assessment and Electrolyte Replacement  Pharmacy consulted to assist in monitoring and replacing electrolytes in this 53 y.o. female admitted on 12/12/2023 undergoing dofetilide  initiation. First dofetilide  dose: 10/6 PM  K 3.7 on admit (drawn 11 min after 40 mEq given) > repeat K was 3.8.  Labs:    Component Value Date/Time   K 3.8 12/14/2023 1556   MG 1.9 12/14/2023 1104     Plan: Potassium: K 3.8-3.9:  Give KCl 40 mEq po x1   Magnesium : Repleted earlier today   OK to give Tikosyn  tonight, 2h post KCl dose.  Maurilio Fila, PharmD Clinical Pharmacist 12/14/2023  5:14 PM

## 2023-12-15 DIAGNOSIS — E876 Hypokalemia: Secondary | ICD-10-CM | POA: Diagnosis not present

## 2023-12-15 DIAGNOSIS — I480 Paroxysmal atrial fibrillation: Secondary | ICD-10-CM | POA: Diagnosis not present

## 2023-12-15 DIAGNOSIS — I5033 Acute on chronic diastolic (congestive) heart failure: Secondary | ICD-10-CM | POA: Diagnosis not present

## 2023-12-15 DIAGNOSIS — I10 Essential (primary) hypertension: Secondary | ICD-10-CM | POA: Diagnosis not present

## 2023-12-15 LAB — BASIC METABOLIC PANEL WITH GFR
Anion gap: 11 (ref 5–15)
Anion gap: 15 (ref 5–15)
BUN: 14 mg/dL (ref 6–20)
BUN: 17 mg/dL (ref 6–20)
CO2: 27 mmol/L (ref 22–32)
CO2: 24 mmol/L (ref 22–32)
Calcium: 8.9 mg/dL (ref 8.9–10.3)
Calcium: 8.6 mg/dL — ABNORMAL LOW (ref 8.9–10.3)
Chloride: 94 mmol/L — ABNORMAL LOW (ref 98–111)
Chloride: 90 mmol/L — ABNORMAL LOW (ref 98–111)
Creatinine, Ser: 0.9 mg/dL (ref 0.44–1.00)
Creatinine, Ser: 1.19 mg/dL — ABNORMAL HIGH (ref 0.44–1.00)
GFR, Estimated: >60 mL/min (ref >60)
GFR, Estimated: 55 mL/min — ABNORMAL LOW (ref >60)
Glucose, Bld: 145 mg/dL — ABNORMAL HIGH (ref 70–99)
Glucose, Bld: 191 mg/dL — ABNORMAL HIGH (ref 70–99)
Potassium: 3.3 mmol/L — ABNORMAL LOW (ref 3.5–5.1)
Potassium: 3.6 mmol/L (ref 3.5–5.1)
Sodium: 132 mmol/L — ABNORMAL LOW (ref 135–145)
Sodium: 129 mmol/L — ABNORMAL LOW (ref 135–145)

## 2023-12-15 LAB — GLUCOSE, CAPILLARY
Glucose-Capillary: 240 mg/dL — ABNORMAL HIGH (ref 70–99)
Glucose-Capillary: 129 mg/dL — ABNORMAL HIGH (ref 70–99)
Glucose-Capillary: 161 mg/dL — ABNORMAL HIGH (ref 70–99)
Glucose-Capillary: 218 mg/dL — ABNORMAL HIGH (ref 70–99)

## 2023-12-15 LAB — CBC
HCT: 38 % (ref 36.0–46.0)
Hemoglobin: 11.9 g/dL — ABNORMAL LOW (ref 12.0–15.0)
MCH: 26 pg (ref 26.0–34.0)
MCHC: 31.3 g/dL (ref 30.0–36.0)
MCV: 83.2 fL (ref 80.0–100.0)
Platelets: 196 K/uL (ref 150–400)
RBC: 4.57 MIL/uL (ref 3.87–5.11)
RDW: 18.4 % — ABNORMAL HIGH (ref 11.5–15.5)
WBC: 8.8 K/uL (ref 4.0–10.5)
nRBC: 0 % (ref 0.0–0.2)

## 2023-12-15 LAB — MAGNESIUM: Magnesium: 2 mg/dL (ref 1.7–2.4)

## 2023-12-15 MED ORDER — POTASSIUM CHLORIDE 10 MEQ/100ML IV SOLN
10.0000 meq | INTRAVENOUS | Status: DC
  Administered 2023-12-15: 10 meq via INTRAVENOUS
  Filled 2023-12-15: qty 100

## 2023-12-15 MED ORDER — POTASSIUM CHLORIDE CRYS ER 20 MEQ PO TBCR: 80.0000 meq | EXTENDED_RELEASE_TABLET | Freq: Every day | ORAL | Status: DC

## 2023-12-15 MED ORDER — POTASSIUM CHLORIDE CRYS ER 20 MEQ PO TBCR
40.0000 meq | EXTENDED_RELEASE_TABLET | ORAL | Status: DC
Start: 2023-12-15 — End: 2023-12-15
  Administered 2023-12-15: 40 meq via ORAL
  Filled 2023-12-15: qty 2

## 2023-12-15 MED ORDER — POTASSIUM CHLORIDE CRYS ER 20 MEQ PO TBCR: 40.0000 meq | EXTENDED_RELEASE_TABLET | Freq: Every day | ORAL | Status: DC

## 2023-12-15 MED ORDER — POTASSIUM CHLORIDE CRYS ER 20 MEQ PO TBCR
60.0000 meq | EXTENDED_RELEASE_TABLET | Freq: Once | ORAL | Status: AC
  Administered 2023-12-15: 60 meq via ORAL
  Filled 2023-12-15: qty 3

## 2023-12-15 MED ORDER — POTASSIUM CHLORIDE CRYS ER 20 MEQ PO TBCR
60.0000 meq | EXTENDED_RELEASE_TABLET | ORAL | Status: AC
  Administered 2023-12-15 (×2): 60 meq via ORAL
  Filled 2023-12-15 (×2): qty 3

## 2023-12-15 NOTE — Progress Notes (Signed)
 Progress Note   Patient: Alyssa Blanchard FMW:[id number] DOB: 30-May-1970 DOA: 12/12/2023     2 DOS: the patient was seen and examined on 12/15/2023   Brief hospital course: Mrs. Stofer was admitted to the hospital with the working diagnosis of atrial fibrillation.   53 yo female with the past medical history of hypertension, T2DM, asthma, depression, anxiety and obesity who presented with palpitations. Reported intermittent palpitations for the last 2 months, but more severe symptoms over the last 24 hrs prior to admission, with heart rate as high as 220 bpm. Palpitations associated with dyspnea, dizziness and diaphoresis. Symptoms persistent despite taking antiarrhythmic and AV blocker at home.  On her initial physical examination her blood pressure was 120/59, HR 120, and 02 saturation 96% Lungs with no wheezing or rhonchi, heart with S1 and S2 present, irregularly irregular, with no gallops, rubs or murmurs, abdomen with no distention and positive lower extremity edema.   Na 139, K 3.7 Cl 104 bicarbonate 24 glucose 109, bun 14 cr <0.30 Mg 1,7  BNP 205  Wbc 8,3 hgb 11.6 plt 186   Chest radiograph with cardiomegaly, bilateral hilar vascular congestion with no infiltrates or effusions.  EKG 121 bpm, normal axis, normal intervals, qtc 484, atrial fibrillation rhythm with no significant ST segment or T wave changes.   Patient was placed on higher dose of metoprolol  and IV diltiazem  drip for rate control.  10/06 wean off diltiazem , consulted EP.  Assessment and Plan: * Acute on chronic diastolic heart failure (HCC) Echocardiogram with preserved LV systolic function with EF 70 to 75%, mild LVH, diastolic parameters normal, RV systolic function preserved, RVSP 38,5 mmHg, no significant valvular disease.   Urine output 6,250  ml Systolic blood pressure 100 mmHg range   Improved volume status.  Will hold on loop diuretic and metolazone  for now.  Continue spironolactone  50 mg po daily.   Metoprolol  tartrate 50 mg q 6 hrs  Paroxysmal atrial fibrillation (HCC) Patient continue in atrial fibrillation rhythm, rate 100 to 110 bpm, personally reviewed telemetry  Plan to continue with metoprolol  tartrate 50 mg q 6 hrs Wean off diltiazem .  Started on dofetilide .   Apparently she has not tolerated po diltiazem  in the past due to edema.   Continue telemetry monitoring Anticoagulation with apixaban .  Keep K at 4 and Mg at 2.   Hypertension Continue blood pressure monitoring Continue metoprolol  for now.   Hypokalemia Hyponatremia,.   Renal function today with serum cr at 0,90 with K at 3.3 and serum bicarbonate at 27  Na 132 . Mg 2.0   Plan to add Kcl 40 meq x2  Follow up renal function in am Hold on bumetanide  and metolazone  for now.   Type 2 diabetes mellitus without complication, without long-term current use of insulin  (HCC) Continue insulin  sliding scale for glucose cover and monitoring Fasting glucose this am 145 mg/dl   Asthma No signs of acute exacerbation.  Continue bronchodilator therapy   OSA on CPAP Cpap   Iron  deficiency anemia Cell count has been stable.  Follow up as outpatient  Obesity, class 3 (HCC) Calculated BMI is 65,4         Subjective: patient with no chest pain and no dyspnea, no PND, orthopnea or lower extremity edema.   Physical Exam: Vitals:   12/15/23 0700 12/15/23 0803 12/15/23 0805 12/15/23 0817  BP: 113/76  (!) 103/59   Pulse: 93 (!) 119 (!) 108 (!) 122  Resp:  19 20   Temp:  97.6 F (36.4 C)   TempSrc:   Oral   SpO2: 96%  100% 97%  Weight:      Height:       Neurology awake and alert ENT with mild pallor Cardiovascular with S1 and S2 present, irregularly irregular with no gallops, rubs or murmurs Respiratory with no rales or wheezing, no rhonchi  Abdomen protuberant, not tender and not distended No lower extremity edema   Data Reviewed:    Family Communication: no family at the bedside    Disposition: Status is: Inpatient Remains inpatient appropriate because: dofetilide    Planned Discharge Destination: Home     Author: Elidia Toribio Furnace, MD 12/15/2023 11:57 AM  For on call review [email or web address].

## 2023-12-15 NOTE — Plan of Care (Signed)
  Problem: Education: Goal: Knowledge of General Education information will improve Description: Including pain rating scale, medication(s)/side effects and non-pharmacologic comfort measures Outcome: Progressing   Problem: Clinical Measurements: Goal: Will remain free from infection Outcome: Progressing   Problem: Nutrition: Goal: Adequate nutrition will be maintained Outcome: Progressing   Problem: Safety: Goal: Ability to remain free from injury will improve Outcome: Progressing   Problem: Skin Integrity: Goal: Risk for impaired skin integrity will decrease Outcome: Progressing   Problem: Education: Goal: Ability to describe self-care measures that may prevent or decrease complications (Diabetes Survival Skills Education) will improve Outcome: Progressing   Problem: Fluid Volume: Goal: Ability to maintain a balanced intake and output will improve Outcome: Progressing   Problem: Health Behavior/Discharge Planning: Goal: Ability to identify and utilize available resources and services will improve Outcome: Progressing   Problem: Skin Integrity: Goal: Risk for impaired skin integrity will decrease Outcome: Progressing   Problem: Health Behavior/Discharge Planning: Goal: Ability to manage health-related needs will improve Outcome: Not Progressing   Problem: Clinical Measurements: Goal: Ability to maintain clinical measurements within normal limits will improve Outcome: Not Progressing Goal: Diagnostic test results will improve Outcome: Not Progressing Goal: Cardiovascular complication will be avoided Outcome: Not Progressing   Problem: Activity: Goal: Risk for activity intolerance will decrease Outcome: Not Progressing   Problem: Coping: Goal: Level of anxiety will decrease Outcome: Not Progressing   Problem: Education: Goal: Knowledge of disease or condition will improve Outcome: Not Progressing   Problem: Activity: Goal: Ability to tolerate increased  activity will improve Outcome: Not Progressing   Problem: Cardiac: Goal: Ability to achieve and maintain adequate cardiopulmonary perfusion will improve Outcome: Not Progressing   Problem: Health Behavior/Discharge Planning: Goal: Ability to safely manage health-related needs after discharge will improve Outcome: Not Progressing   Problem: Coping: Goal: Ability to adjust to condition or change in health will improve Outcome: Not Progressing   Problem: Metabolic: Goal: Ability to maintain appropriate glucose levels will improve Outcome: Not Progressing   Problem: Nutritional: Goal: Progress toward achieving an optimal weight will improve Outcome: Not Progressing

## 2023-12-15 NOTE — Telephone Encounter (Signed)
 Pre-op team,   Patient is currently admitted for Tikosyn  load with plan for cardioversion on 10/10. Please inform patient she will have to wait 4 weeks after cardioversion/Tikosyn  load to interrupt anticoagulation and undergo dental procedure.   Thank you!  Barnie HERO. Geraldy Akridge, DNP, NP-C  12/15/2023, 4:48 PM [facility] [address] Office [phone] Fax [phone]

## 2023-12-15 NOTE — Evaluation (Signed)
 Physical Therapy Evaluation Patient Details Name: Alyssa Blanchard MRN: [id number] DOB: December 10, 1970 Today's Date: 12/15/2023  History of Present Illness  53 yo F adm 12/12/23 with rapid Afib. PMHx: obesity, HFpEF,  PAF on Eliquis , asthma, T2DM, HTN, anemia, bipolar disorder  Clinical Impression  Pt very pleasant, lives at home with mom and provides IADLs for both of them. Pt reports most of her activity is frequent MD visits but is independent with basic function. Pt with HR 120-130 at rest and with activity 122-149, pt reports lightheadedness and nausea with upright mobility with pt stating new medication and not feeling well moving with it. Pt with BP 103/59 (73) at rest after gait 93/64 (75) and 93/65 (74) respectively with 2 trials. Pt will benefit from acute therapy and continued mobility with staff to monitor HR and BP with activity for safe return home. Encouraged OOB to chair and BSC.         If plan is discharge home, recommend the following: Assistance with cooking/housework   Can travel by private vehicle        Equipment Recommendations None recommended by PT  Recommendations for Other Services       Functional Status Assessment Patient has had a recent decline in their functional status and demonstrates the ability to make significant improvements in function in a reasonable and predictable amount of time.     Precautions / Restrictions Precautions Precautions: Other (comment) Recall of Precautions/Restrictions: Intact Precaution/Restrictions Comments: watch HR      Mobility  Bed Mobility Overal bed mobility: Modified Independent             General bed mobility comments: HOB 40 degrees with use of rail to rise and pivot to EOB    Transfers Overall transfer level: Modified independent                 General transfer comment: pt able to rise from bed, BSC and recliner without assist    Ambulation/Gait Ambulation/Gait assistance: Supervision Gait  Distance (Feet): 30 Feet Assistive device: None Gait Pattern/deviations: Step-through pattern, Decreased stride length   Gait velocity interpretation: 1.31 - 2.62 ft/sec, indicative of limited community ambulator   General Gait Details: HR 122-149 with limited gait. pt feeling fatigued and lightheaded with mobility limiting gait. Pt walked 30' x 2 trials  Stairs            Wheelchair Mobility     Tilt Bed    Modified Rankin (Stroke Patients Only)       Balance Overall balance assessment: No apparent balance deficits (not formally assessed)                                           Pertinent Vitals/Pain Pain Assessment Pain Assessment: No/denies pain    Home Living Family/patient expects to be discharged to:: Private residence Living Arrangements: Parent Available Help at Discharge: Family;Available 24 hours/day Type of Home: House Home Access: Stairs to enter Entrance Stairs-Rails: Can reach both Entrance Stairs-Number of Steps: 5   Home Layout: One level;Other (Comment) Home Equipment: Shower seat;Rolling Walker (2 wheels);Cane - single point;BSC/3in1;Hospital bed Additional Comments: sleeps in hospital bed, lives with mom whom she assists    Prior Function Prior Level of Function : Independent/Modified Independent;Driving             Mobility Comments: walks with cane  Extremity/Trunk Assessment   Upper Extremity Assessment Upper Extremity Assessment: Overall WFL for tasks assessed    Lower Extremity Assessment Lower Extremity Assessment: Overall WFL for tasks assessed    Cervical / Trunk Assessment Cervical / Trunk Assessment: Other exceptions Cervical / Trunk Exceptions: increased body habitus  Communication   Communication Communication: No apparent difficulties    Cognition Arousal: Alert Behavior During Therapy: WFL for tasks assessed/performed   PT - Cognitive impairments: No apparent impairments                          Following commands: Intact       Cueing Cueing Techniques: Verbal cues     General Comments      Exercises     Assessment/Plan    PT Assessment Patient needs continued PT services  PT Problem List Decreased activity tolerance;Cardiopulmonary status limiting activity       PT Treatment Interventions Gait training;Functional mobility training;Therapeutic activities;Patient/family education;Stair training    PT Goals (Current goals can be found in the Care Plan section)  Acute Rehab PT Goals Patient Stated Goal: return home PT Goal Formulation: With patient Time For Goal Achievement: 12/29/23 Potential to Achieve Goals: Good    Frequency Min 1X/week     Co-evaluation               AM-PAC PT 6 Clicks Mobility  Outcome Measure Help needed turning from your back to your side while in a flat bed without using bedrails?: None Help needed moving from lying on your back to sitting on the side of a flat bed without using bedrails?: None Help needed moving to and from a bed to a chair (including a wheelchair)?: A Little Help needed standing up from a chair using your arms (e.g., wheelchair or bedside chair)?: A Little Help needed to walk in hospital room?: A Little Help needed climbing 3-5 steps with a railing? : A Little 6 Click Score: 20    End of Session   Activity Tolerance: Patient tolerated treatment well Patient left: in chair;with call bell/phone within reach Nurse Communication: Mobility status PT Visit Diagnosis: Other abnormalities of gait and mobility (R26.89)    Time: 9192-9167 PT Time Calculation (min) (ACUTE ONLY): 25 min   Charges:   PT Evaluation $PT Eval Low Complexity: 1 Low   PT General Charges $$ ACUTE PT VISIT: 1 Visit         Lenoard SQUIBB, PT Acute Rehabilitation Services Office: [phone]   Lenoard NOVAK Deiontae Rabel 12/15/2023, 10:28 AM

## 2023-12-15 NOTE — Progress Notes (Addendum)
 Pharmacy: Dofetilide  (Tikosyn ) - Follow Up Assessment and Electrolyte Replacement  Pharmacy consulted to assist in monitoring and replacing electrolytes in this 53 y.o. female admitted on 12/12/2023 undergoing dofetilide  initiation.   Labs:    Component Value Date/Time   K 3.3 (L) 12/15/2023 0547   MG 2.0 12/15/2023 0547     Plan: Potassium: K= 3.2: Give a total of 140meq potassium today then on 10/8 restart her home regimen 80meq qam and 40meq in pm  Magnesium : Mg 1.8-2: Give Mg 2 gm IV x1   -At home she takes potassium 80meq in the morning and 40meq in the evening as well as another 40meq when she takes metolazone  (MWF) -She is noted on bumetanide , metolazone  5mg  MW and spironolactone  -recheck K later today  Thank you for allowing pharmacy to participate in this patient's care   [name], PharmD Clinical Pharmacist **Pharmacist phone directory can now be found on [email or web address] ([email or web address]).  Listed under [facility].

## 2023-12-15 NOTE — Progress Notes (Signed)
 Progress Note  Patient Name: Alyssa Blanchard Date of Encounter: 12/15/2023  Primary Cardiologist: Annabella Scarce, MD   Subjective   Still in afib Started on Tikosyn  per EP   Inpatient Medications    Scheduled Meds:  apixaban   5 mg Oral BID   budesonide -glycopyrrolate -formoterol   2 puff Inhalation BID   bumetanide   3 mg Oral Daily   dofetilide   500 mcg Oral BID   insulin  aspart  0-5 Units Subcutaneous QHS   insulin  aspart  0-6 Units Subcutaneous TID WC   Lifitegrast   1 drop Both Eyes BID   loratadine   10 mg Oral Daily   melatonin  10 mg Oral QHS   metolazone   5 mg Oral Once per day on Monday Wednesday Friday   metoprolol  tartrate  50 mg Oral Q6H   pantoprazole   40 mg Oral BID AC   potassium chloride   40 mEq Oral Q4H   pregabalin   300 mg Oral BID   sodium chloride  flush  10-40 mL Intracatheter Q12H   sodium chloride  flush  3 mL Intravenous Q12H   sodium chloride  flush  3 mL Intravenous Q12H   spironolactone   50 mg Oral Daily   Continuous Infusions:  sodium chloride      PRN Meds: sodium chloride , acetaminophen  **OR** acetaminophen , albuterol , polyethylene glycol, prochlorperazine , sodium chloride  flush, traMADol   Vital Signs    Vitals:   12/15/23 0700 12/15/23 0803 12/15/23 0805 12/15/23 0817  BP: 113/76  (!) 103/59   Pulse: 93 (!) 119 (!) 108 (!) 122  Resp:  19 20   Temp:   97.6 F (36.4 C)   TempSrc:   Oral   SpO2: 96%  100% 97%  Weight:      Height:        Intake/Output Summary (Last 24 hours) at 12/15/2023 9177 Last data filed at 12/15/2023 9561 Gross per 24 hour  Intake 1022.51 ml  Output 6250 ml  Net -5227.49 ml   Filed Weights   12/13/23 0500  Weight: (!) 178.3 kg    Telemetry    Atrial fibrillation - Personally Reviewed  ECG    Afib with RVR - Personally Reviewed  Physical Exam   Affect appropriate Healthy:  appears stated age HEENT: normal Neck supple with no adenopathy JVP normal no bruits no thyromegaly Lungs clear with  no wheezing and good diaphragmatic motion Heart:  S1/S2 no murmur, no rub, gallop or click PMI normal Abdomen: benighn, BS positve, no tenderness, no AAA no bruit.  No HSM or HJR Distal pulses intact with no bruits No edema Neuro non-focal Skin warm and dry No muscular weakness   Labs    Chemistry Recent Labs  Lab 12/14/23 1104 12/14/23 1556 12/15/23 0547  NA 137 134* 132*  K 3.7 3.8 3.3*  CL 97* 93* 94*  CO2 29 29 27   GLUCOSE 141* 111* 145*  BUN 12 12 14   CREATININE 1.01* 0.94 0.90  CALCIUM 8.9 9.1 8.9  GFRNONAA >60 >60 >60  ANIONGAP 11 12 11      Hematology Recent Labs  Lab 12/13/23 0532 12/14/23 0751 12/15/23 0547  WBC 10.0 8.6 8.8  RBC 3.97 4.09 4.57  HGB 10.4* 10.6* 11.9*  HCT 34.1* 35.0* 38.0  MCV 85.9 85.6 83.2  MCH 26.2 25.9* 26.0  MCHC 30.5 30.3 31.3  RDW 18.5* 18.3* 18.4*  PLT 195 189 196    Cardiac EnzymesNo results for input(s): TROPONINI in the last 168 hours. No results for input(s): TROPIPOC in the last 168  hours.   BNP Recent Labs  Lab 12/12/23 2340  BNP 205.5*     DDimer No results for input(s): DDIMER in the last 168 hours.   Radiology    No results found.   Cardiac Studies   Echo 04/24/23   1. Left ventricular ejection fraction, by estimation, is 70 to 75%. The  left ventricle has hyperdynamic function. The left ventricle has no  regional wall motion abnormalities. There is mild left ventricular  hypertrophy. Left ventricular diastolic  parameters were normal (medial e' 9.7 cm/s).   2. Right ventricular systolic function is normal. The right ventricular  size is normal. There is mildly elevated pulmonary artery systolic  pressure. The estimated right ventricular systolic pressure is 38.5 mmHg.   3. The mitral valve is grossly normal. Trivial mitral valve  regurgitation. No evidence of mitral stenosis.   4. The aortic valve was not well visualized. There is mild calcification  of the aortic valve. Aortic valve  regurgitation is not visualized. No  aortic stenosis is present.   5. The inferior vena cava is dilated in size with >50% respiratory  variability, suggesting right atrial pressure of 8 mmHg.   6. Increased flow velocities may be secondary to anemia, thyrotoxicosis,  hyperdynamic or high flow state.     Assessment & Plan   Principal Problem:   Acute on chronic diastolic heart failure (HCC) Active Problems:   Paroxysmal atrial fibrillation (HCC)   Iron  deficiency anemia   Type 2 diabetes mellitus without complication, without long-term current use of insulin  (HCC)   Asthma   Hypertension   Hypokalemia   Obesity, class 3 (HCC)   OSA on CPAP    Acute on chronic diastolic HF, appears at/near euvolemia Given lasix  120 mg x 1 with good output  On bumex  3 mg daily and metolazone  5 mg three times weekly Continue home regimen diuretics Paroxysmal atrial fibrillation with RVR Eliquis  5 mg BID, Lopressor  50 mg Q6h and a cardizem  drip @ 7.5  Tikosyn  started Lopressor  50 mg q 6 for rate control K 3.3 Cr 0.9 Supplement Mg is 2.0  Per EP plan [facility] Thursday if not converted with AAT Morbid obesity On Zepbound. This will likely delay a cardioversion depending on her last dose.  For questions or updates, please contact [facility] Please consult [email or web address] for contact info under        Signed, Maude Emmer, MD  12/15/2023, 8:22 AM

## 2023-12-15 NOTE — Progress Notes (Signed)
   12/15/23 1954  BiPAP/CPAP/SIPAP  Reason BIPAP/CPAP not in use Non-compliant   Patient declines CPAP. No unit in room at this time.

## 2023-12-15 NOTE — Progress Notes (Signed)
 PROGRESS NOTE    Alyssa Blanchard  FMW:[id number] DOB: 1971-02-26 DOA: 12/12/2023 PCP: [name], MD  53/F w hypertension, T2DM, asthma, depression, anxiety and obesity who presented with palpitations. Reported intermittent palpitations for the last 2 months, but more severe symptoms over the last 24 hrs prior to admission, with heart rate as high as 220 bpm. Palpitations associated with dyspnea, dizziness and diaphoresis. Symptoms persistent despite taking antiarrhythmic and AV blocker at home. Labs-Na 139, K 3.7 Cl 104 bicarbonate 24 glucose 109, bun 14 cr <0.30 Mg 1,7 BNP 205 ,Wbc 8,3 hgb 11.6 plt 186   CXR w cardiomegaly, bilateral hilar vascular congestion with no infiltrates or effusions.  -Patient was placed on higher dose of metoprolol  and IV diltiazem  drip for rate control.  10/06 weaned off diltiazem , consulted EP. - 10/7, started on Tikosyn   Subjective: Feels fair, remains in A-fib, heart rate in the 120s, asymptomatic, feels tired overall  Assessment and Plan:  Acute on chronic diastolic heart failure (HCC) Echocardiogram with preserved LV systolic function with EF 70 to 75%, mild LVH, diastolic parameters normal, RV systolic function preserved, RVSP 38,5 mmHg, no significant valvular disease.  -Diuresed with IV Lasix  initially, volume status is difficult to assess -Now switched to oral Bumex , continue Aldactone , metoprolol  Q6h  Paroxysmal atrial fibrillation (HCC) Patient continue in atrial fibrillation rhythm, rate 100 to 110 bpm,  -continue with metoprolol  tartrate 50 mg q 6 hrs -Now started on Tikosyn , continue apixaban  -Plan for cardioversion on Friday if she does not convert  Hypertension -As above Continue metoprolol  for now.   Hypokalemia Hyponatremia,.  -replete Hold on bumetanide  and metolazone  for now.   Type 2 diabetes mellitus  - Stable, SSI  Asthma No signs of acute exacerbation.  Continue bronchodilator therapy   OSA on CPAP Cpap    Iron  deficiency anemia Cell count has been stable.  Follow up as outpatient  Obesity, class 3 (HCC) Calculated BMI is 65,4    DVT prophylaxis: apixaban  Code Status: Full Code Family Communication: Disposition Plan:   Consultants:    Procedures:   Antimicrobials:    Objective: Vitals:   12/15/23 0805 12/15/23 0817 12/15/23 1213 12/15/23 1602  BP: (!) 103/59  105/71 (!) 106/92  Pulse: (!) 108 (!) 122 (!) 141 (!) 141  Resp: 20  18 19   Temp: 97.6 F (36.4 C)  (!) 97.3 F (36.3 C) 98.5 F (36.9 C)  TempSrc: Oral  Oral Oral  SpO2: 100% 97% 95% 96%  Weight:      Height:        Intake/Output Summary (Last 24 hours) at 12/15/2023 1801 Last data filed at 12/15/2023 0900 Gross per 24 hour  Intake 1200 ml  Output 2000 ml  Net -800 ml   Filed Weights   12/13/23 0500  Weight: (!) 178.3 kg    Examination:  Gen: Awake, Alert, Oriented X 3,  HEENT: Obese unable to assess JVD Lungs: Distant breath sounds CVS: S1S2/irregular rhythm Abd: soft, Non tender, non distended, BS present Extremities: No edema Skin: no new rashes on exposed skin    Data Reviewed:   CBC: Recent Labs  Lab 12/12/23 1721 12/13/23 0532 12/14/23 0751 12/15/23 0547  WBC 8.3 10.0 8.6 8.8  HGB 11.6* 10.4* 10.6* 11.9*  HCT 39.7 34.1* 35.0* 38.0  MCV 87.8 85.9 85.6 83.2  PLT 186 195 189 196   Basic Metabolic Panel: Recent Labs  Lab 12/12/23 1721 12/13/23 0532 12/14/23 0751 12/14/23 1104 12/14/23 1556 12/15/23 0547  NA  139 140 134* 137 134* 132*  K 3.7 3.2* 3.4* 3.7 3.8 3.3*  CL 104 100 98 97* 93* 94*  CO2 24 29 26 29 29 27   GLUCOSE 109* 161* 177* 141* 111* 145*  BUN 14 11 10 12 12 14   CREATININE <0.30* 0.91 0.89 1.01* 0.94 0.90  CALCIUM 8.7* 8.2* 8.3* 8.9 9.1 8.9  MG 1.7 1.8  --  1.9  --  2.0   GFR: Estimated Creatinine Clearance: 120.4 mL/min (by C-G formula based on SCr of 0.9 mg/dL). Liver Function Tests: No results for input(s): AST, ALT, ALKPHOS, BILITOT,  PROT, ALBUMIN in the last 168 hours. No results for input(s): LIPASE, AMYLASE in the last 168 hours. No results for input(s): AMMONIA in the last 168 hours. Coagulation Profile: No results for input(s): INR, PROTIME in the last 168 hours. Cardiac Enzymes: No results for input(s): CKTOTAL, CKMB, CKMBINDEX, TROPONINI in the last 168 hours. BNP (last 3 results) Recent Labs    11/11/23 0910  PROBNP 195.0   HbA1C: No results for input(s): HGBA1C in the last 72 hours. CBG: Recent Labs  Lab 12/14/23 1621 12/14/23 2117 12/15/23 0805 12/15/23 1146 12/15/23 1558  GLUCAP 131* 160* 240* 129* 161*   Lipid Profile: No results for input(s): CHOL, HDL, LDLCALC, TRIG, CHOLHDL, LDLDIRECT in the last 72 hours. Thyroid Function Tests: No results for input(s): TSH, T4TOTAL, FREET4, T3FREE, THYROIDAB in the last 72 hours. Anemia Panel: No results for input(s): VITAMINB12, FOLATE, FERRITIN, TIBC, IRON , RETICCTPCT in the last 72 hours. Urine analysis:    Component Value Date/Time   COLORURINE YELLOW 11/11/2023 1022   APPEARANCEUR CLEAR 11/11/2023 1022   LABSPEC 1.005 11/11/2023 1022   PHURINE 7.0 11/11/2023 1022   GLUCOSEU NEGATIVE 11/11/2023 1022   HGBUR NEGATIVE 11/11/2023 1022   BILIRUBINUR NEGATIVE 11/11/2023 1022   BILIRUBINUR negative 07/28/2016 1614   BILIRUBINUR neg 08/29/2014 1528   KETONESUR NEGATIVE 11/11/2023 1022   PROTEINUR NEGATIVE 11/11/2023 1022   UROBILINOGEN 0.2 07/28/2016 1614   NITRITE NEGATIVE 11/11/2023 1022   LEUKOCYTESUR NEGATIVE 11/11/2023 1022   Sepsis Labs: @LABRCNTIP (procalcitonin:4,lacticidven:4)  )No results found for this or any previous visit (from the past 240 hours).   Radiology Studies: No results found.   Scheduled Meds:  apixaban   5 mg Oral BID   budesonide -glycopyrrolate -formoterol   2 puff Inhalation BID   dofetilide   500 mcg Oral BID   insulin  aspart  0-5 Units Subcutaneous QHS    insulin  aspart  0-6 Units Subcutaneous TID WC   Lifitegrast   1 drop Both Eyes BID   loratadine   10 mg Oral Daily   melatonin  10 mg Oral QHS   metoprolol  tartrate  50 mg Oral Q6H   pantoprazole   40 mg Oral BID AC   [START ON 12/16/2023] potassium chloride   40 mEq Oral QHS   pregabalin   300 mg Oral BID   sodium chloride  flush  10-40 mL Intracatheter Q12H   sodium chloride  flush  3 mL Intravenous Q12H   sodium chloride  flush  3 mL Intravenous Q12H   spironolactone   50 mg Oral Daily   Continuous Infusions:   LOS: 2 days    Time spent:    Sigurd Pac, MD [facility]   12/15/2023, 6:01 PM

## 2023-12-15 NOTE — Progress Notes (Signed)
 Pharmacy: Dofetilide  (Tikosyn ) - Follow Up Assessment and Electrolyte Replacement  Pharmacy consulted to assist in monitoring and replacing electrolytes in this 53 y.o. female admitted on 12/12/2023 undergoing dofetilide  initiation.   Labs:    Component Value Date/Time   K 3.6 12/15/2023 1825   MG 2.0 12/15/2023 0547     Plan: Potassium: K= 3.6: Patient already supplemented with 160 mEq today.   - At home she takes potassium 80meq in the morning and 40meq in the evening as well as another 40meq when she takes metolazone  (MWF)- planning to resume patient's home supplementation on 10/8  - She is noted on bumetanide , metolazone  5mg  MW and spironolactone  - 10/7 PM- supplement additional 60 meQ PO and 40 meQ as IV given aggressive supplementation with K largely unchanged   Thank you for allowing pharmacy to participate in this patient's care   Massie Fila, PharmD Clinical Pharmacist  12/15/2023 8:02 PM

## 2023-12-15 NOTE — Progress Notes (Signed)
 Morning EKG reviewed     Shows pt remains in afib with stable QTc at ~450 ms when corrected for faster rates  Continue  Tikosyn  500 mcg BID.   Potassium3.3* (10/07 0547) Magnesium   2.0 (10/07 0547) Creatinine, ser  0.90 (10/07 0547)  Plan for cardioversion Friday if QT remains stable (given last dose of Zepbound 10/2)   Ozell Prentice Passey, PA-C  12/15/2023 11:05 AM

## 2023-12-15 NOTE — Progress Notes (Signed)
 Electrophysiology Rounding Note  Patient Name: Alyssa Blanchard Date of Encounter: 12/15/2023  Primary Cardiologist: Annabella Scarce, MD  Electrophysiologist: Eulas FORBES Furbish, MD    Subjective   Pt remains in afib on Tikosyn  500 mcg BID   QTc from EKG last pm shows borderline QTc at ~440-450 ms when corrected for rate [place])  The patient is doing well today.  At this time, the patient denies chest pain, shortness of breath, or any new concerns.  Inpatient Medications    Scheduled Meds:  apixaban   5 mg Oral BID   budesonide -glycopyrrolate -formoterol   2 puff Inhalation BID   bumetanide   3 mg Oral Daily   dofetilide   500 mcg Oral BID   insulin  aspart  0-5 Units Subcutaneous QHS   insulin  aspart  0-6 Units Subcutaneous TID WC   Lifitegrast   1 drop Both Eyes BID   loratadine   10 mg Oral Daily   melatonin  10 mg Oral QHS   metolazone   5 mg Oral Once per day on Monday Wednesday Friday   metoprolol  tartrate  50 mg Oral Q6H   pantoprazole   40 mg Oral BID AC   potassium chloride   40 mEq Oral Q4H   pregabalin   300 mg Oral BID   sodium chloride  flush  10-40 mL Intracatheter Q12H   sodium chloride  flush  3 mL Intravenous Q12H   sodium chloride  flush  3 mL Intravenous Q12H   spironolactone   50 mg Oral Daily   Continuous Infusions:  sodium chloride      PRN Meds: sodium chloride , acetaminophen  **OR** acetaminophen , albuterol , polyethylene glycol, prochlorperazine , sodium chloride  flush, traMADol   Vital Signs    Vitals:   12/15/23 0700 12/15/23 0803 12/15/23 0805 12/15/23 0817  BP: 113/76  (!) 103/59   Pulse: 93 (!) 119 (!) 108 (!) 122  Resp:  19 20   Temp:   97.6 F (36.4 C)   TempSrc:   Oral   SpO2: 96%  100% 97%  Weight:      Height:        Intake/Output Summary (Last 24 hours) at 12/15/2023 0822 Last data filed at 12/15/2023 0438 Gross per 24 hour  Intake 1022.51 ml  Output 6250 ml  Net -5227.49 ml   Filed Weights   12/13/23 0500  Weight: (!) 178.3  kg    Physical Exam    GEN- NAD, A&O x 3. Normal affect.  Lungs- CTAB, Normal effort.  Heart- Irregularly irregular rate and rhythm. No M/G/R GI- Soft, NT, ND Extremities- No clubbing, cyanosis, or edema Skin- no rash or lesion  Labs    CBC Recent Labs    12/14/23 0751 12/15/23 0547  WBC 8.6 8.8  HGB 10.6* 11.9*  HCT 35.0* 38.0  MCV 85.6 83.2  PLT 189 196   Basic Metabolic Panel Recent Labs    89/93/74 1104 12/14/23 1556 12/15/23 0547  NA 137 134* 132*  K 3.7 3.8 3.3*  CL 97* 93* 94*  CO2 29 29 27   GLUCOSE 141* 111* 145*  BUN 12 12 14   CREATININE 1.01* 0.94 0.90  CALCIUM 8.9 9.1 8.9  MG 1.9  --  2.0    Telemetry    AF with variable rates 90-120s mostly (personally reviewed)  Patient Profile     Alyssa Blanchard is a 53 y.o. female with a past medical history significant for persistent atrial fibrillation.  They were admitted for tikosyn  load.   Assessment & Plan    Persistent atrial fibrillation Pt remains  in afib on Tikosyn  500 mcg BID  Continue Eliquis  Creatinine, ser  0.90 (10/07 0547) Magnesium   2.0 (10/07 0547) Potassium3.3* (10/07 0547) Supplement both K and Mg per protocol  If pt does not convert chemically, plan on DCCV Thursday  - Given Zepbound last Thursday  Hypokalemia On significant regimen at home.  Also on BID protonix  which could be affecting absorption Will continue to work with pharmacy on her K regimen.  Acute on chronic diastolic CHF  Doing OK thus far back on home diuretic regimen.    For questions or updates, please contact [facility] Please consult [email or web address] for contact info under Cardiology/STEMI.  Signed, Ozell Prentice Passey, PA-C  12/15/2023, 8:22 AM

## 2023-12-16 DIAGNOSIS — I5033 Acute on chronic diastolic (congestive) heart failure: Secondary | ICD-10-CM | POA: Diagnosis not present

## 2023-12-16 LAB — BASIC METABOLIC PANEL WITH GFR
Anion gap: 14 (ref 5–15)
Anion gap: 14 (ref 5–15)
BUN: 17 mg/dL (ref 6–20)
BUN: 20 mg/dL (ref 6–20)
CO2: 24 mmol/L (ref 22–32)
CO2: 25 mmol/L (ref 22–32)
Calcium: 8.6 mg/dL — ABNORMAL LOW (ref 8.9–10.3)
Calcium: 9 mg/dL (ref 8.9–10.3)
Chloride: 92 mmol/L — ABNORMAL LOW (ref 98–111)
Chloride: 92 mmol/L — ABNORMAL LOW (ref 98–111)
Creatinine, Ser: 1.12 mg/dL — ABNORMAL HIGH (ref 0.44–1.00)
Creatinine, Ser: 1.06 mg/dL — ABNORMAL HIGH (ref 0.44–1.00)
GFR, Estimated: 59 mL/min — ABNORMAL LOW (ref >60)
GFR, Estimated: >60 mL/min (ref >60)
Glucose, Bld: 182 mg/dL — ABNORMAL HIGH (ref 70–99)
Glucose, Bld: 185 mg/dL — ABNORMAL HIGH (ref 70–99)
Potassium: 3.9 mmol/L (ref 3.5–5.1)
Potassium: 3.8 mmol/L (ref 3.5–5.1)
Sodium: 130 mmol/L — ABNORMAL LOW (ref 135–145)
Sodium: 131 mmol/L — ABNORMAL LOW (ref 135–145)

## 2023-12-16 LAB — CBC
HCT: 36.6 % (ref 36.0–46.0)
Hemoglobin: 11.5 g/dL — ABNORMAL LOW (ref 12.0–15.0)
MCH: 26 pg (ref 26.0–34.0)
MCHC: 31.4 g/dL (ref 30.0–36.0)
MCV: 82.8 fL (ref 80.0–100.0)
Platelets: 183 K/uL (ref 150–400)
RBC: 4.42 MIL/uL (ref 3.87–5.11)
RDW: 18.1 % — ABNORMAL HIGH (ref 11.5–15.5)
WBC: 9.8 K/uL (ref 4.0–10.5)
nRBC: 0 % (ref 0.0–0.2)

## 2023-12-16 LAB — MAGNESIUM: Magnesium: 1.8 mg/dL (ref 1.7–2.4)

## 2023-12-16 LAB — GLUCOSE, CAPILLARY
Glucose-Capillary: 184 mg/dL — ABNORMAL HIGH (ref 70–99)
Glucose-Capillary: 130 mg/dL — ABNORMAL HIGH (ref 70–99)
Glucose-Capillary: 175 mg/dL — ABNORMAL HIGH (ref 70–99)
Glucose-Capillary: 187 mg/dL — ABNORMAL HIGH (ref 70–99)

## 2023-12-16 MED ORDER — POTASSIUM CHLORIDE CRYS ER 20 MEQ PO TBCR: 40.0000 meq | EXTENDED_RELEASE_TABLET | Freq: Every day | ORAL | Status: DC

## 2023-12-16 MED ORDER — POTASSIUM CHLORIDE CRYS ER 20 MEQ PO TBCR
40.0000 meq | EXTENDED_RELEASE_TABLET | ORAL | Status: AC
  Administered 2023-12-16 (×2): 40 meq via ORAL
  Filled 2023-12-16 (×2): qty 2

## 2023-12-16 MED ORDER — BUMETANIDE 2 MG PO TABS
3.0000 mg | ORAL_TABLET | Freq: Every day | ORAL | Status: DC
  Administered 2023-12-16 – 2023-12-18 (×3): 3 mg via ORAL
  Filled 2023-12-16 (×3): qty 1

## 2023-12-16 MED ORDER — POTASSIUM CHLORIDE 10 MEQ/100ML IV SOLN
10.0000 meq | INTRAVENOUS | Status: DC
  Filled 2023-12-16: qty 100

## 2023-12-16 MED ORDER — POTASSIUM CHLORIDE CRYS ER 20 MEQ PO TBCR
20.0000 meq | EXTENDED_RELEASE_TABLET | Freq: Once | ORAL | Status: AC
  Administered 2023-12-17: 20 meq via ORAL
  Filled 2023-12-16: qty 1

## 2023-12-16 MED ORDER — POTASSIUM CHLORIDE CRYS ER 20 MEQ PO TBCR: 60.0000 meq | EXTENDED_RELEASE_TABLET | Freq: Two times a day (BID) | ORAL | Status: DC

## 2023-12-16 MED ORDER — POTASSIUM CHLORIDE CRYS ER 20 MEQ PO TBCR
60.0000 meq | EXTENDED_RELEASE_TABLET | ORAL | Status: AC
  Administered 2023-12-16 (×2): 60 meq via ORAL
  Filled 2023-12-16 (×2): qty 3

## 2023-12-16 MED ORDER — MAGNESIUM SULFATE 2 GM/50ML IV SOLN
2.0000 g | Freq: Once | INTRAVENOUS | Status: AC
  Administered 2023-12-16: 2 g via INTRAVENOUS
  Filled 2023-12-16: qty 50

## 2023-12-16 NOTE — Progress Notes (Addendum)
 Pharmacy: Dofetilide  (Tikosyn ) - Follow Up Assessment and Electrolyte Replacement  Pharmacy consulted to assist in monitoring and replacing electrolytes in this 53 y.o. female admitted on 12/12/2023 undergoing dofetilide  initiation.  Labs:    Component Value Date/Time   K 3.9 12/16/2023 0454   MG 1.8 12/16/2023 0454     Plan: Potassium: -K dur 60meq x2 and recheck K later today  -She received a total on 220meq potasssium on 10/7.Metolazone  discontinued 10/7  Magnesium : Mg > 2: No additional supplementation needed    Thank you for allowing pharmacy to participate in this patient's care   [name], PharmD Clinical Pharmacist **Pharmacist phone directory can now be found on [email or web address] ([id number]).  Listed under [facility].

## 2023-12-16 NOTE — Progress Notes (Signed)
 Pharmacy: Dofetilide  (Tikosyn ) - Follow Up Assessment and Electrolyte Replacement  Pharmacy consulted to assist in monitoring and replacing electrolytes in this 53 y.o. female admitted on 12/12/2023 undergoing dofetilide  initiation.  Labs:    Component Value Date/Time   K 3.8 12/16/2023 1813   MG 1.8 12/16/2023 0454     Plan: Potassium: 3.9 this morning > 120 mEq given (60 mEq x2) > K = 3.8 (drawn ~6h from last dose).  Of note, required 230 mEq of KCl supplementation on 10/7.  PTA metolazone  5 mg discontinued on 10/7, continues on Bumex  3 mg daily.    Given potassium requirements, will give 40 mEq x2 along with 2 runs.   Magnesium : Mg 1.8-2: Will order Mg 2 gm IV x1    Thank you for allowing pharmacy to participate in this patient's care   Maurilio Fila, PharmD Clinical Pharmacist 12/16/2023  7:18 PM

## 2023-12-16 NOTE — Telephone Encounter (Signed)
 I will update Dr. Sheryle, DMD. Pt has been updated as well as to the plan of care. She is agreeable.

## 2023-12-16 NOTE — Progress Notes (Signed)
 Secure chat with RN regarding malfunctioning  midline. RN will remove if unable to flush easily.

## 2023-12-16 NOTE — Progress Notes (Signed)
 Morning EKG reviewed     Shows pt remains in afib with stable QTc at ~460-470 ms when corrected for tachcardia.  Continue  Tikosyn  500 mcg BID.   Potassium3.9 (10/08 0454) Magnesium   1.8 (10/08 0454) Creatinine, ser  1.12* (10/08 0454)  Plan DCCV on Friday if does not convert.  Ozell Prentice Passey, PA-C  12/16/2023 10:58 AM

## 2023-12-16 NOTE — Progress Notes (Signed)
 Mobility Specialist Progress Note:   12/16/23 1610  Mobility  Activity Ambulated with assistance  Level of Assistance Standby assist, set-up cues, supervision of patient - no hands on  Assistive Device Cane  Distance Ambulated (ft) 260 ft  Activity Response Tolerated well  Mobility Referral Yes  Mobility visit 1 Mobility  Mobility Specialist Start Time (ACUTE ONLY) 1610  Mobility Specialist Stop Time (ACUTE ONLY) 1623  Mobility Specialist Time Calculation (min) (ACUTE ONLY) 13 min   Pt agreeable to mobility session. Required no physical assistance throughout ambulation with cane. Maintain HR 90s in NSR throughout session. Pt back sitting EOB with all needs met. Encouraged frequent OOB mobility.   Therisa Rana Mobility Specialist Please contact via SecureChat or  Rehab office at [phone]

## 2023-12-16 NOTE — Progress Notes (Signed)
 Pt does not want CPAP. States she may be going home tomorrow.

## 2023-12-16 NOTE — Progress Notes (Signed)
 Electrophysiology Rounding Note  Patient Name: Alyssa Blanchard Date of Encounter: 12/16/2023  Primary Cardiologist: Annabella Scarce, MD  Electrophysiologist: Eulas FORBES Furbish, MD    Subjective   Pt remains in afib on Tikosyn  500 mcg BID   QTc from EKG last pm shows stable QTc at ~450-460 when corrected for tachycardia.  The patient is doing well today.  At this time, the patient denies chest pain, shortness of breath, or any new concerns.  Inpatient Medications    Scheduled Meds:  apixaban   5 mg Oral BID   budesonide -glycopyrrolate -formoterol   2 puff Inhalation BID   bumetanide   3 mg Oral Daily   dofetilide   500 mcg Oral BID   insulin  aspart  0-5 Units Subcutaneous QHS   insulin  aspart  0-6 Units Subcutaneous TID WC   Lifitegrast   1 drop Both Eyes BID   loratadine   10 mg Oral Daily   melatonin  10 mg Oral QHS   metoprolol  tartrate  50 mg Oral Q6H   pantoprazole   40 mg Oral BID AC   potassium chloride   60 mEq Oral Q4H   pregabalin   300 mg Oral BID   sodium chloride  flush  10-40 mL Intracatheter Q12H   sodium chloride  flush  3 mL Intravenous Q12H   sodium chloride  flush  3 mL Intravenous Q12H   spironolactone   50 mg Oral Daily   Continuous Infusions:  PRN Meds: acetaminophen  **OR** acetaminophen , albuterol , polyethylene glycol, prochlorperazine , sodium chloride  flush, traMADol   Vital Signs    Vitals:   12/15/23 2344 12/16/23 0408 12/16/23 0757 12/16/23 0820  BP: 94/62 112/77 106/78   Pulse: (!) 114 99 (!) 130 (!) 130  Resp: 19 16 17    Temp: 97.8 F (36.6 C) 98.2 F (36.8 C) 97.7 F (36.5 C)   TempSrc: Oral Oral Oral   SpO2: 94% 93% 94%   Weight:      Height:        Intake/Output Summary (Last 24 hours) at 12/16/2023 0832 Last data filed at 12/16/2023 0410 Gross per 24 hour  Intake 180 ml  Output 350 ml  Net -170 ml   Filed Weights   12/13/23 0500  Weight: (!) 178.3 kg    Physical Exam    GEN- NAD, A&O x 3. Normal affect.  Lungs- CTAB,  Normal effort.  Heart- Irregularly irregular rate and rhythm. No M/G/R GI- Soft, NT, ND Extremities- No clubbing, cyanosis, or edema Skin- no rash or lesion  Labs    CBC Recent Labs    12/15/23 0547 12/16/23 0454  WBC 8.8 9.8  HGB 11.9* 11.5*  HCT 38.0 36.6  MCV 83.2 82.8  PLT 196 183   Basic Metabolic Panel Recent Labs    89/92/74 0547 12/15/23 1825 12/16/23 0454  NA 132* 129* 130*  K 3.3* 3.6 3.9  CL 94* 90* 92*  CO2 27 24 24   GLUCOSE 145* 191* 182*  BUN 14 17 17   CREATININE 0.90 1.19* 1.12*  CALCIUM 8.9 8.6* 8.6*  MG 2.0  --  1.8    Telemetry    AF 90-130s (personally reviewed)  Patient Profile     Alyssa Blanchard is a 53 y.o. female with a past medical history significant for persistent atrial fibrillation.  They were admitted for acute on chronic CHF and AF with RVR. EP consulted to consider Tikosyn .   Assessment & Plan    Persistent atrial fibrillation Pt remains in afib on Tikosyn  500 mcg BID  Continue Eliquis  Creatinine, ser  1.12* (10/08 0454) Magnesium   1.8 (10/08 0454) Potassium3.9 (10/08 0454) Supplement both K and Mg per protocol  If pt does not convert chemically, plan on DCCV Friday   Hypokalemia Acute on chronic diastolic CHF Volume status improved CONTINUE Bumex  -> We need to know her appropriate K needs on home regimen.  OK to hold metolazone  for now -> hopefully will need less once she's maintaining sinus.   For questions or updates, please contact [facility] Please consult [email or web address] for contact info under Cardiology/STEMI.  Signed, Ozell Prentice Passey, PA-C  12/16/2023, 8:32 AM

## 2023-12-16 NOTE — Plan of Care (Signed)
  Problem: Education: Goal: Knowledge of General Education information will improve Description: Including pain rating scale, medication(s)/side effects and non-pharmacologic comfort measures Outcome: Progressing   Problem: Health Behavior/Discharge Planning: Goal: Ability to manage health-related needs will improve Outcome: Progressing   Problem: Clinical Measurements: Goal: Ability to maintain clinical measurements within normal limits will improve Outcome: Progressing Goal: Will remain free from infection Outcome: Progressing Goal: Diagnostic test results will improve Outcome: Progressing Goal: Respiratory complications will improve Outcome: Progressing Goal: Cardiovascular complication will be avoided Outcome: Progressing   Problem: Activity: Goal: Risk for activity intolerance will decrease Outcome: Progressing   Problem: Nutrition: Goal: Adequate nutrition will be maintained Outcome: Progressing   Problem: Coping: Goal: Level of anxiety will decrease Outcome: Progressing   Problem: Elimination: Goal: Will not experience complications related to bowel motility Outcome: Progressing Goal: Will not experience complications related to urinary retention Outcome: Progressing   Problem: Pain Managment: Goal: General experience of comfort will improve and/or be controlled Outcome: Progressing   Problem: Safety: Goal: Ability to remain free from injury will improve Outcome: Progressing   Problem: Skin Integrity: Goal: Risk for impaired skin integrity will decrease Outcome: Progressing   Problem: Education: Goal: Knowledge of disease or condition will improve Outcome: Progressing Goal: Understanding of medication regimen will improve Outcome: Progressing Goal: Individualized Educational Video(s) Outcome: Progressing   Problem: Activity: Goal: Ability to tolerate increased activity will improve Outcome: Progressing   Problem: Cardiac: Goal: Ability to achieve  and maintain adequate cardiopulmonary perfusion will improve Outcome: Progressing   Problem: Health Behavior/Discharge Planning: Goal: Ability to safely manage health-related needs after discharge will improve Outcome: Progressing   Problem: Education: Goal: Ability to describe self-care measures that may prevent or decrease complications (Diabetes Survival Skills Education) will improve Outcome: Progressing Goal: Individualized Educational Video(s) Outcome: Progressing   Problem: Coping: Goal: Ability to adjust to condition or change in health will improve Outcome: Progressing   Problem: Fluid Volume: Goal: Ability to maintain a balanced intake and output will improve Outcome: Progressing   Problem: Health Behavior/Discharge Planning: Goal: Ability to identify and utilize available resources and services will improve Outcome: Progressing Goal: Ability to manage health-related needs will improve Outcome: Progressing   Problem: Metabolic: Goal: Ability to maintain appropriate glucose levels will improve Outcome: Progressing   Problem: Nutritional: Goal: Maintenance of adequate nutrition will improve Outcome: Progressing Goal: Progress toward achieving an optimal weight will improve Outcome: Progressing   Problem: Skin Integrity: Goal: Risk for impaired skin integrity will decrease Outcome: Progressing   Problem: Tissue Perfusion: Goal: Adequacy of tissue perfusion will improve Outcome: Progressing

## 2023-12-16 NOTE — TOC CM/SW Note (Addendum)
 Transition of Care [facility]) - Inpatient Brief Assessment   Patient Details  Name: Alyssa Blanchard MRN: [id number] Date of Birth: [birth year]  Transition of Care [facility]) CM/SW Contact:    Sudie Erminio Deems, RN Phone Number: 12/16/2023, 11:07 AM   Clinical Narrative: Patient presented for Tikosyn  Load. Inpatient Case Manager spoke with the patient regarding co pay cost. Patient is agreeable to cost and would like to have the initial Rx filled via [facility] and the Rx refills 90 day supply escribed to [facility] [address] No further needs identified at this time.  1117 12-16-23 Patient states she is trying to reach out to family for transportation home. If patient cannot secure a ride home; the dc lounge can assist with a cab ride home.  Transition of Care Asessment: Insurance and Status: Insurance coverage has been reviewed Patient has primary care physician: Yes Home environment has been reviewed: reviewed Prior level of function:: Independent Prior/Current Home Services: No current home services Social Drivers of Health Review: SDOH reviewed no interventions necessary Readmission risk has been reviewed: Yes Transition of care needs: no transition of care needs at this time

## 2023-12-17 ENCOUNTER — Other Ambulatory Visit (HOSPITAL_COMMUNITY): Payer: Self-pay | Admitting: Cardiology

## 2023-12-17 DIAGNOSIS — I5033 Acute on chronic diastolic (congestive) heart failure: Secondary | ICD-10-CM | POA: Diagnosis not present

## 2023-12-17 LAB — BASIC METABOLIC PANEL WITH GFR
Anion gap: 12 (ref 5–15)
BUN: 18 mg/dL (ref 6–20)
CO2: 26 mmol/L (ref 22–32)
Calcium: 9.1 mg/dL (ref 8.9–10.3)
Chloride: 94 mmol/L — ABNORMAL LOW (ref 98–111)
Creatinine, Ser: 0.89 mg/dL (ref 0.44–1.00)
GFR, Estimated: >60 mL/min (ref >60)
Glucose, Bld: 128 mg/dL — ABNORMAL HIGH (ref 70–99)
Potassium: 4 mmol/L (ref 3.5–5.1)
Sodium: 132 mmol/L — ABNORMAL LOW (ref 135–145)

## 2023-12-17 LAB — MAGNESIUM: Magnesium: 2.2 mg/dL (ref 1.7–2.4)

## 2023-12-17 LAB — GLUCOSE, CAPILLARY
Glucose-Capillary: 124 mg/dL — ABNORMAL HIGH (ref 70–99)
Glucose-Capillary: 160 mg/dL — ABNORMAL HIGH (ref 70–99)
Glucose-Capillary: 162 mg/dL — ABNORMAL HIGH (ref 70–99)
Glucose-Capillary: 177 mg/dL — ABNORMAL HIGH (ref 70–99)

## 2023-12-17 MED ORDER — DOFETILIDE 250 MCG PO CAPS
250.0000 ug | ORAL_CAPSULE | Freq: Two times a day (BID) | ORAL | Status: DC
  Administered 2023-12-17 – 2023-12-18 (×2): 250 ug via ORAL
  Filled 2023-12-17 (×2): qty 1

## 2023-12-17 MED ORDER — METOPROLOL SUCCINATE ER 50 MG PO TB24
50.0000 mg | ORAL_TABLET | Freq: Every day | ORAL | Status: DC
  Administered 2023-12-17: 50 mg via ORAL
  Filled 2023-12-17: qty 1

## 2023-12-17 MED ORDER — POTASSIUM CHLORIDE CRYS ER 20 MEQ PO TBCR
60.0000 meq | EXTENDED_RELEASE_TABLET | Freq: Once | ORAL | Status: AC
  Administered 2023-12-17: 60 meq via ORAL
  Filled 2023-12-17: qty 3

## 2023-12-17 MED ORDER — POTASSIUM CHLORIDE CRYS ER 20 MEQ PO TBCR
60.0000 meq | EXTENDED_RELEASE_TABLET | Freq: Three times a day (TID) | ORAL | Status: DC
  Administered 2023-12-17 – 2023-12-18 (×2): 60 meq via ORAL
  Filled 2023-12-17 (×2): qty 6

## 2023-12-17 MED ORDER — POTASSIUM CHLORIDE CRYS ER 20 MEQ PO TBCR
80.0000 meq | EXTENDED_RELEASE_TABLET | Freq: Once | ORAL | Status: AC
  Administered 2023-12-17: 80 meq via ORAL
  Filled 2023-12-17: qty 4

## 2023-12-17 NOTE — Progress Notes (Signed)
 Mobility Specialist Progress Note;   12/17/23 1135  Mobility  Activity Ambulated with assistance  Level of Assistance Standby assist, set-up cues, supervision of patient - no hands on  Assistive Device Cane  Distance Ambulated (ft) 250 ft  Activity Response Tolerated well  Mobility Referral Yes  Mobility visit 1 Mobility  Mobility Specialist Start Time (ACUTE ONLY) 1135  Mobility Specialist Stop Time (ACUTE ONLY) 1148  Mobility Specialist Time Calculation (min) (ACUTE ONLY) 13 min   Pt agreeable to mobility. Required no physical assistance, SV for safety. HR between 95-99 bpm throughout. C/o some fatigue therefore requested to return to room. Pt returned back to bed and left with all needs met. Requested to ambulate again this afternoon, will f/u as able.   Lauraine Erm Mobility Specialist Please contact via SecureChat or [employer] [phone]

## 2023-12-17 NOTE — Progress Notes (Addendum)
 Morning EKG reviewed     Shows remains in NSR with prolonged QTc at >500 ms.  Decrease dose to  Tikosyn  250 mcg BID.   Potassium4.0 (10/09 0425) Magnesium   2.2 (10/09 0425) Creatinine, ser  0.89 (10/09 0425)  Discussed with pt. With QT prolongation and dose reduction, she will need to stay through tomorrow.   Ozell Prentice Passey, PA-C  12/17/2023

## 2023-12-17 NOTE — Progress Notes (Signed)
 Electrophysiology Rounding Note  Patient Name: Alyssa Blanchard Date of Encounter: 12/17/2023  Primary Cardiologist: Annabella Scarce, MD  Electrophysiologist: Eulas FORBES Furbish, MD    Subjective   Pt converted to sinus rhythm on Tikosyn  500 mcg BID   QTc from EKG last pm shows stable QTc at ~480 ms when measured manually.  The patient is doing well today.  At this time, the patient denies chest pain, shortness of breath, or any new concerns.  Inpatient Medications    Scheduled Meds:  apixaban   5 mg Oral BID   budesonide -glycopyrrolate -formoterol   2 puff Inhalation BID   bumetanide   3 mg Oral Daily   dofetilide   500 mcg Oral BID   insulin  aspart  0-5 Units Subcutaneous QHS   insulin  aspart  0-6 Units Subcutaneous TID WC   Lifitegrast   1 drop Both Eyes BID   loratadine   10 mg Oral Daily   melatonin  10 mg Oral QHS   metoprolol  tartrate  50 mg Oral Q6H   pantoprazole   40 mg Oral BID AC   pregabalin   300 mg Oral BID   sodium chloride  flush  3 mL Intravenous Q12H   sodium chloride  flush  3 mL Intravenous Q12H   spironolactone   50 mg Oral Daily   Continuous Infusions:  PRN Meds: acetaminophen  **OR** acetaminophen , albuterol , polyethylene glycol, prochlorperazine , sodium chloride  flush, traMADol   Vital Signs    Vitals:   12/16/23 1940 12/16/23 2328 12/17/23 0429 12/17/23 0748  BP: 110/64 107/62 112/65 (!) 105/58  Pulse: 90 90 74 74  Resp: 16 16 16 19   Temp: 98.2 F (36.8 C) 98.8 F (37.1 C) 97.8 F (36.6 C) 98.3 F (36.8 C)  TempSrc: Oral Oral Oral Oral  SpO2: 98% 93% 96% 96%  Weight:      Height:        Intake/Output Summary (Last 24 hours) at 12/17/2023 0805 Last data filed at 12/17/2023 0431 Gross per 24 hour  Intake 530 ml  Output 200 ml  Net 330 ml   Filed Weights   12/13/23 0500  Weight: (!) 178.3 kg    Physical Exam    GEN- NAD, A&O x 3. Normal affect.  Lungs- CTAB, Normal effort.  Heart- Regular rate and rhythm. No M/G/R GI- Soft, NT,  ND Extremities- No clubbing, cyanosis, or edema Skin- no rash or lesion  Labs    CBC Recent Labs    12/15/23 0547 12/16/23 0454  WBC 8.8 9.8  HGB 11.9* 11.5*  HCT 38.0 36.6  MCV 83.2 82.8  PLT 196 183   Basic Metabolic Panel Recent Labs    89/91/74 0454 12/16/23 1813 12/17/23 0425  NA 130* 131* 132*  K 3.9 3.8 4.0  CL 92* 92* 94*  CO2 24 25 26   GLUCOSE 182* 185* 128*  BUN 17 20 18   CREATININE 1.12* 1.06* 0.89  CALCIUM 8.6* 9.0 9.1  MG 1.8  --  2.2    Telemetry    NSR 70-90s (personally reviewed)  Patient Profile     Alyssa Blanchard is a 53 y.o. female with a past medical history significant for persistent atrial fibrillation.  They were admitted for tikosyn  load.   Assessment & Plan    Persistent atrial fibrillation Pt converted to sinus rhythm on Tikosyn  500 mcg BID  Continue Eliquis  Creatinine, ser  0.89 (10/09 0425) Magnesium   2.2 (10/09 0425) Potassium4.0 (10/09 0425) Supplement K  Will be ok for home this afternoon if QT remains stable  Reviewed  with Dr. Zenaida; Will continue bumex  at PTA dosing. Change metolazone  to PRN only 2.5 mg with extra 40 meq of K. If she has to take it, she will call the office for next day labs.   For questions or updates, please contact [facility] Please consult [email or web address] for contact info under Cardiology/STEMI.  Signed, Ozell Prentice Passey, PA-C  12/17/2023, 8:05 AM

## 2023-12-17 NOTE — Plan of Care (Signed)
  Problem: Education: Goal: Knowledge of General Education information will improve Description: Including pain rating scale, medication(s)/side effects and non-pharmacologic comfort measures Outcome: Progressing   Problem: Health Behavior/Discharge Planning: Goal: Ability to manage health-related needs will improve Outcome: Progressing   Problem: Clinical Measurements: Goal: Ability to maintain clinical measurements within normal limits will improve Outcome: Progressing Goal: Will remain free from infection Outcome: Progressing Goal: Diagnostic test results will improve Outcome: Progressing Goal: Respiratory complications will improve Outcome: Progressing Goal: Cardiovascular complication will be avoided Outcome: Progressing   Problem: Activity: Goal: Risk for activity intolerance will decrease Outcome: Progressing   Problem: Nutrition: Goal: Adequate nutrition will be maintained Outcome: Progressing   Problem: Coping: Goal: Level of anxiety will decrease Outcome: Progressing   Problem: Elimination: Goal: Will not experience complications related to bowel motility Outcome: Progressing Goal: Will not experience complications related to urinary retention Outcome: Progressing   Problem: Pain Managment: Goal: General experience of comfort will improve and/or be controlled Outcome: Progressing   Problem: Safety: Goal: Ability to remain free from injury will improve Outcome: Progressing   Problem: Skin Integrity: Goal: Risk for impaired skin integrity will decrease Outcome: Progressing   Problem: Education: Goal: Knowledge of disease or condition will improve Outcome: Progressing Goal: Understanding of medication regimen will improve Outcome: Progressing Goal: Individualized Educational Video(s) Outcome: Progressing   Problem: Activity: Goal: Ability to tolerate increased activity will improve Outcome: Progressing   Problem: Cardiac: Goal: Ability to achieve  and maintain adequate cardiopulmonary perfusion will improve Outcome: Progressing   Problem: Health Behavior/Discharge Planning: Goal: Ability to safely manage health-related needs after discharge will improve Outcome: Progressing   Problem: Education: Goal: Ability to describe self-care measures that may prevent or decrease complications (Diabetes Survival Skills Education) will improve Outcome: Progressing Goal: Individualized Educational Video(s) Outcome: Progressing   Problem: Coping: Goal: Ability to adjust to condition or change in health will improve Outcome: Progressing   Problem: Fluid Volume: Goal: Ability to maintain a balanced intake and output will improve Outcome: Progressing   Problem: Health Behavior/Discharge Planning: Goal: Ability to identify and utilize available resources and services will improve Outcome: Progressing Goal: Ability to manage health-related needs will improve Outcome: Progressing   Problem: Metabolic: Goal: Ability to maintain appropriate glucose levels will improve Outcome: Progressing   Problem: Nutritional: Goal: Maintenance of adequate nutrition will improve Outcome: Progressing Goal: Progress toward achieving an optimal weight will improve Outcome: Progressing   Problem: Skin Integrity: Goal: Risk for impaired skin integrity will decrease Outcome: Progressing   Problem: Tissue Perfusion: Goal: Adequacy of tissue perfusion will improve Outcome: Progressing

## 2023-12-17 NOTE — Progress Notes (Signed)
 PROGRESS NOTE    Alyssa Blanchard  FMW:[id number] DOB: 18-Jan-1971 DOA: 12/12/2023 PCP: [name], MD  53/F w hypertension, T2DM, asthma, depression, anxiety and obesity who presented with palpitations. Reported intermittent palpitations for the last 2 months, but more severe symptoms over the last 24 hrs prior to admission, with heart rate as high as 220 bpm. Palpitations associated with dyspnea, dizziness and diaphoresis. Symptoms persistent despite taking antiarrhythmic and AV blocker at home. Labs-Na 139, K 3.7 Cl 104 bicarbonate 24 glucose 109, bun 14 cr <0.30 Mg 1,7 BNP 205 ,Wbc 8,3 hgb 11.6 plt 186   CXR w cardiomegaly, bilateral hilar vascular congestion with no infiltrates or effusions.  -Patient was placed on higher dose of metoprolol  and IV diltiazem  drip for rate control.  10/06 weaned off diltiazem , consulted EP. - 10/7, started on Tikosyn  - 10/8 converted to sinus rhythm  Subjective: Feels well, breathing is improving Assessment and Plan:  Acute on chronic diastolic heart failure (HCC) Echocardiogram with preserved LV systolic function with EF 70 to 75%, mild LVH, diastolic parameters normal, RV systolic function preserved, RVSP 38,5 mmHg, no significant valvular disease.  -Diuresed with IV Lasix  initially, volume status is difficult to assess -Now switched to oral Bumex , continue Aldactone , metoprolol , changed to Toprol  - EP recommended to change metolazone  to PRN only 2.5 mg with extra KCl 40 mEq  Paroxysmal atrial fibrillation (HCC) Patient continue in atrial fibrillation rhythm, rate 100 to 110 bpm,  -continue with Toprol  all, could change to daily -Now started on Tikosyn , continue apixaban  - QT prolonged today, per EP  Hypertension -As above Continue metoprolol  for now.   Hypokalemia Hyponatremia,.  -repleted  Type 2 diabetes mellitus  - Stable, SSI  Asthma No signs of acute exacerbation.  Continue bronchodilator therapy   OSA on CPAP Cpap    Iron  deficiency anemia Cell count has been stable.  Follow up as outpatient  Obesity, class 3 (HCC) Calculated BMI is 65,4    DVT prophylaxis: apixaban  Code Status: Full Code Family Communication: None present Disposition Plan: Home tomorrow  Consultants:    Procedures:   Antimicrobials:    Objective: Vitals:   12/16/23 2328 12/17/23 0429 12/17/23 0748 12/17/23 0855  BP: 107/62 112/65 (!) 105/58   Pulse: 90 74 74 75  Resp: 16 16 19 19   Temp: 98.8 F (37.1 C) 97.8 F (36.6 C) 98.3 F (36.8 C)   TempSrc: Oral Oral Oral   SpO2: 93% 96% 96% 96%  Weight:      Height:        Intake/Output Summary (Last 24 hours) at 12/17/2023 1201 Last data filed at 12/17/2023 0431 Gross per 24 hour  Intake 290 ml  Output 200 ml  Net 90 ml   Filed Weights   12/13/23 0500  Weight: (!) 178.3 kg    Examination:  Gen: Awake, Alert, Oriented X 3,  HEENT: Obese unable to assess JVD Lungs: Distant breath sounds CVS: S1S2/regular rhythm Abd: soft, Non tender, non distended, BS present Extremities: No edema Skin: no new rashes on exposed skin    Data Reviewed:   CBC: Recent Labs  Lab 12/12/23 1721 12/13/23 0532 12/14/23 0751 12/15/23 0547 12/16/23 0454  WBC 8.3 10.0 8.6 8.8 9.8  HGB 11.6* 10.4* 10.6* 11.9* 11.5*  HCT 39.7 34.1* 35.0* 38.0 36.6  MCV 87.8 85.9 85.6 83.2 82.8  PLT 186 195 189 196 183   Basic Metabolic Panel: Recent Labs  Lab 12/13/23 0532 12/14/23 0751 12/14/23 1104 12/14/23 1556 12/15/23  9452 12/15/23 1825 12/16/23 0454 12/16/23 1813 12/17/23 0425  NA 140   < > 137   < > 132* 129* 130* 131* 132*  K 3.2*   < > 3.7   < > 3.3* 3.6 3.9 3.8 4.0  CL 100   < > 97*   < > 94* 90* 92* 92* 94*  CO2 29   < > 29   < > 27 24 24 25 26   GLUCOSE 161*   < > 141*   < > 145* 191* 182* 185* 128*  BUN 11   < > 12   < > 14 17 17 20 18   CREATININE 0.91   < > 1.01*   < > 0.90 1.19* 1.12* 1.06* 0.89  CALCIUM 8.2*   < > 8.9   < > 8.9 8.6* 8.6* 9.0 9.1  MG 1.8  --   1.9  --  2.0  --  1.8  --  2.2   < > = values in this interval not displayed.   GFR: Estimated Creatinine Clearance: 121.7 mL/min (by C-G formula based on SCr of 0.89 mg/dL). Liver Function Tests: No results for input(s): AST, ALT, ALKPHOS, BILITOT, PROT, ALBUMIN in the last 168 hours. No results for input(s): LIPASE, AMYLASE in the last 168 hours. No results for input(s): AMMONIA in the last 168 hours. Coagulation Profile: No results for input(s): INR, PROTIME in the last 168 hours. Cardiac Enzymes: No results for input(s): CKTOTAL, CKMB, CKMBINDEX, TROPONINI in the last 168 hours. BNP (last 3 results) Recent Labs    11/11/23 0910  PROBNP 195.0   HbA1C: No results for input(s): HGBA1C in the last 72 hours. CBG: Recent Labs  Lab 12/16/23 1127 12/16/23 1627 12/16/23 2100 12/17/23 0744 12/17/23 1159  GLUCAP 130* 175* 187* 124* 160*   Lipid Profile: No results for input(s): CHOL, HDL, LDLCALC, TRIG, CHOLHDL, LDLDIRECT in the last 72 hours. Thyroid Function Tests: No results for input(s): TSH, T4TOTAL, FREET4, T3FREE, THYROIDAB in the last 72 hours. Anemia Panel: No results for input(s): VITAMINB12, FOLATE, FERRITIN, TIBC, IRON , RETICCTPCT in the last 72 hours. Urine analysis:    Component Value Date/Time   COLORURINE YELLOW 11/11/2023 1022   APPEARANCEUR CLEAR 11/11/2023 1022   LABSPEC 1.005 11/11/2023 1022   PHURINE 7.0 11/11/2023 1022   GLUCOSEU NEGATIVE 11/11/2023 1022   HGBUR NEGATIVE 11/11/2023 1022   BILIRUBINUR NEGATIVE 11/11/2023 1022   BILIRUBINUR negative 07/28/2016 1614   BILIRUBINUR neg 08/29/2014 1528   KETONESUR NEGATIVE 11/11/2023 1022   PROTEINUR NEGATIVE 11/11/2023 1022   UROBILINOGEN 0.2 07/28/2016 1614   NITRITE NEGATIVE 11/11/2023 1022   LEUKOCYTESUR NEGATIVE 11/11/2023 1022   Sepsis Labs: @LABRCNTIP (procalcitonin:4,lacticidven:4)  )No results found for this or any previous  visit (from the past 240 hours).   Radiology Studies: No results found.   Scheduled Meds:  apixaban   5 mg Oral BID   budesonide -glycopyrrolate -formoterol   2 puff Inhalation BID   bumetanide   3 mg Oral Daily   dofetilide   250 mcg Oral BID   insulin  aspart  0-5 Units Subcutaneous QHS   insulin  aspart  0-6 Units Subcutaneous TID WC   Lifitegrast   1 drop Both Eyes BID   loratadine   10 mg Oral Daily   melatonin  10 mg Oral QHS   metoprolol  tartrate  50 mg Oral Q6H   pantoprazole   40 mg Oral BID AC   potassium chloride   60 mEq Oral Once   pregabalin   300 mg Oral BID   sodium chloride   flush  3 mL Intravenous Q12H   sodium chloride  flush  3 mL Intravenous Q12H   spironolactone   50 mg Oral Daily   Continuous Infusions:   LOS: 4 days    Time spent:    Sigurd Pac, MD [facility]   12/17/2023, 12:01 PM

## 2023-12-17 NOTE — Progress Notes (Signed)
 Pharmacy: Dofetilide  (Tikosyn ) - Follow Up Assessment and Electrolyte Replacement  Pharmacy consulted to assist in monitoring and replacing electrolytes in this 53 y.o. female admitted on 12/12/2023 undergoing dofetilide  initiation.   Labs:    Component Value Date/Time   K 4.0 12/17/2023 0425   MG 2.2 12/17/2023 0425     Plan: Potassium: -Based on daily requirements will give 80meq this morning and 60meq before she leaves  Magnesium : Mg > 2: No additional supplementation needed   As patient has required on average 200 mEq of potassium replacement every day, recommend discharging patient with prescription for:  Potassium chloride  60 mEq tid  Thank you for allowing pharmacy to participate in this patient's care  [name], PharmD Clinical Pharmacist **Pharmacist phone directory can now be found on [email or web address] ([email or web address]).  Listed under [facility].

## 2023-12-18 ENCOUNTER — Telehealth (HOSPITAL_BASED_OUTPATIENT_CLINIC_OR_DEPARTMENT_OTHER): Payer: Self-pay | Admitting: Cardiovascular Disease

## 2023-12-18 ENCOUNTER — Other Ambulatory Visit (HOSPITAL_COMMUNITY): Payer: Self-pay

## 2023-12-18 DIAGNOSIS — I5033 Acute on chronic diastolic (congestive) heart failure: Secondary | ICD-10-CM | POA: Diagnosis not present

## 2023-12-18 LAB — BASIC METABOLIC PANEL WITH GFR
Anion gap: 13 (ref 5–15)
BUN: 14 mg/dL (ref 6–20)
CO2: 23 mmol/L (ref 22–32)
Calcium: 8.6 mg/dL — ABNORMAL LOW (ref 8.9–10.3)
Chloride: 95 mmol/L — ABNORMAL LOW (ref 98–111)
Creatinine, Ser: 0.86 mg/dL (ref 0.44–1.00)
GFR, Estimated: >60 mL/min (ref >60)
Glucose, Bld: 155 mg/dL — ABNORMAL HIGH (ref 70–99)
Potassium: 3.9 mmol/L (ref 3.5–5.1)
Sodium: 131 mmol/L — ABNORMAL LOW (ref 135–145)

## 2023-12-18 LAB — MAGNESIUM: Magnesium: 1.8 mg/dL (ref 1.7–2.4)

## 2023-12-18 LAB — GLUCOSE, CAPILLARY: Glucose-Capillary: 224 mg/dL — ABNORMAL HIGH (ref 70–99)

## 2023-12-18 SURGERY — CARDIOVERSION (CATH LAB)
Anesthesia: General

## 2023-12-18 MED ORDER — METOLAZONE 5 MG PO TABS: 2.5000 mg | ORAL_TABLET | ORAL | Status: DC | PRN

## 2023-12-18 MED ORDER — MAGNESIUM SULFATE 4 GM/100ML IV SOLN
4.0000 g | Freq: Once | INTRAVENOUS | Status: AC
  Administered 2023-12-18: 4 g via INTRAVENOUS
  Filled 2023-12-18: qty 100

## 2023-12-18 MED ORDER — POTASSIUM CHLORIDE ER 10 MEQ PO TBCR
20.0000 meq | EXTENDED_RELEASE_TABLET | Freq: Once | ORAL | Status: AC
  Administered 2023-12-18: 20 meq via ORAL
  Filled 2023-12-18: qty 2

## 2023-12-18 MED ORDER — DOFETILIDE 250 MCG PO CAPS
250.0000 ug | ORAL_CAPSULE | Freq: Two times a day (BID) | ORAL | 6 refills | Status: AC
  Filled 2023-12-18: qty 60, 30d supply, fill #0

## 2023-12-18 MED ORDER — POTASSIUM CHLORIDE CRYS ER 20 MEQ PO TBCR
60.0000 meq | EXTENDED_RELEASE_TABLET | Freq: Three times a day (TID) | ORAL | 6 refills | Status: DC
  Filled 2023-12-18: qty 300, 30d supply, fill #0

## 2023-12-18 NOTE — Progress Notes (Signed)
 DISCHARGE NOTE HOME Devan Renee Collett to be discharged HomeDISCHARGE NOTE HOME Dera Renee Mcdade to be discharged Home per MD order. Discussed prescriptions and follow up appointments with the patient. Prescriptions given to patient; medication list explained in detail. Patient verbalized understanding.  Skin clean, dry and intact without evidence of skin break down, no evidence of skin tears noted. IV catheter discontinued intact. Site without signs and symptoms of complications. Dressing and pressure applied. Pt denies pain at the site currently. No complaints noted.  Patient free of lines, drains, and wounds.   An After Visit Summary (AVS) was printed and given to the patient. Patient escorted via wheelchair, and discharged home via private auto.  Peyton SHAUNNA Pepper, RN per MD order. Discussed prescriptions and follow up appointments with the patient. Prescriptions given to patient; medication list explained in detail. Patient verbalized understanding.  Skin clean, dry and intact without evidence of skin break down, no evidence of skin tears noted. IV catheter discontinued intact. Site without signs and symptoms of complications. Dressing and pressure applied. Pt denies pain at the site currently. No complaints noted.  Patient free of lines, drains, and wounds.   An After Visit Summary (AVS) was printed and given to the patient. Patient escorted via wheelchair, and discharged home via private auto.  Peyton SHAUNNA Pepper, RN

## 2023-12-18 NOTE — Discharge Summary (Signed)
 Physician Discharge Summary  Alyssa Blanchard FMW:[id number] DOB: March 19, 1970 DOA: 12/12/2023  PCP: [name], MD  Admit date: 12/12/2023 Discharge date: 12/18/2023  Time spent: 45 minutes  Recommendations for Outpatient Follow-up:  EP in 1 to 2 weeks, please check BMP at follow-up CHF Dr. Zenaida   Discharge Diagnoses:  Principal Problem:   Acute on chronic diastolic heart failure (HCC) Active Problems:   Paroxysmal atrial fibrillation with RVR   Hypertension   Hypokalemia   Type 2 diabetes mellitus without complication, without long-term current use of insulin  (HCC)   Asthma   OSA on CPAP   Iron  deficiency anemia   Obesity, class 3 (HCC)   Discharge Condition: Improved  Diet recommendation: Diabetic, low sodium, heart healthy  Filed Weights   12/13/23 0500 12/18/23 0340  Weight: (!) 178.3 kg (!) 169.5 kg    History of present illness:  53/F w hypertension, T2DM, asthma, depression, anxiety and obesity who presented with palpitations. Reported intermittent palpitations for the last 2 months, but more severe symptoms over the last 24 hrs prior to admission, with heart rate as high as 220 bpm. Palpitations associated with dyspnea, dizziness and diaphoresis. Symptoms persistent despite taking antiarrhythmic and AV blocker at home. Labs-Na 139, K 3.7 Cl 104 bicarbonate 24 glucose 109, bun 14 cr <0.30 Mg 1,7 BNP 205 ,Wbc 8,3 hgb 11.6 plt 186   CXR w cardiomegaly, bilateral hilar vascular congestion with no infiltrates or effusions.  -Patient was placed on higher dose of metoprolol  and IV diltiazem  drip for rate control.  10/06 weaned off diltiazem , consulted EP. - 10/7, started on Tikosyn  - 10/8 converted to sinus rhythm  Hospital Course:   Acute on chronic diastolic heart failure (HCC) Echocardiogram with preserved LV systolic function with EF 70 to 75%, mild LVH, diastolic parameters normal, RV systolic function preserved, RVSP 38,5 mmHg, no significant valvular  disease.  -Diuresed with IV Lasix  initially, volume status is difficult to assess -Now switched to oral Bumex  3mg  daily, continue Aldactone , metoprolol  changed to Toprol , KCL 60meq TID recommended after d/w CHF - EP recommended to change metolazone  to PRN only 2.5 mg with extra KCl 40 mEq   Paroxysmal atrial fibrillation (HCC) - Started on Tikosyn , Toprol  continued, continue apixaban  - Cleared for discharge home today by EP, they will follow-up in clinic  Hypertension -As above Continue metoprolol  for now.    Hypokalemia Hyponatremia,.  -repleted   Type 2 diabetes mellitus  - Stable, SSI   Asthma No signs of acute exacerbation.  Continue bronchodilator therapy    OSA on CPAP -Continue continue Cpap    Iron  deficiency anemia Stable    Obesity, class 3 (HCC) Calculated BMI is 65,4  - Recommended diet and lifestyle modification   Discharge Exam: Vitals:   12/18/23 0735 12/18/23 0743  BP: 135/70   Pulse: 77 86  Resp: 16 18  Temp: 98 F (36.7 C)   SpO2: 97% 98%   Gen: Awake, Alert, Oriented X 3, morbidly obese, no distress HEENT: Obese unable to assess JVD Lungs: Distant breath sounds CVS: S1S2/regular rhythm Abd: soft, Non tender, non distended, BS present Extremities: No edema Skin: no new rashes on exposed skin   Discharge Instructions   Discharge Instructions     Amb referral to [facility] Clinic   Complete by: As directed    Amb referral to AFIB Clinic   Complete by: As directed    Diet - low sodium heart healthy   Complete by: As directed  Diet Carb Modified   Complete by: As directed    Increase activity slowly   Complete by: As directed       Allergies as of 12/18/2023       Reactions   Gabapentin Anaphylaxis, Nausea And Vomiting, Other (See Comments)   Cannot walk   Topiramate  Other (See Comments)   Jaw tightness and chest discomfort    Albuterol  Other (See Comments)   CAN tolerate levalbuterol    Hydroxyzine  Other (See Comments)   QT  prolongation related   Imitrex  [sumatriptan ] Nausea And Vomiting, Other (See Comments)   Increased heart rate   Latex Itching   Metformin Other (See Comments)   Interaction with heart medications   Montelukast  Other (See Comments)   Nightmares    Other Itching, Other (See Comments)   Plastic and artificial Christmas trees = Itching   Prozac [fluoxetine] Other (See Comments)   Bad, vivid dreams   Semaglutide  Nausea And Vomiting   nausea   Trazodone  And Nefazodone Other (See Comments)   Bad dreams 01/27/20 Pt is unsure if allergic to Nefazodone   Advair Diskus [fluticasone -salmeterol] Palpitations   Copper-containing Compounds Rash, Other (See Comments)   Makes the face burn, also   Tape Rash   Redness and itching        Medication List     STOP taking these medications    flecainide  100 MG tablet Commonly known as: TAMBOCOR        TAKE these medications    acetaminophen  325 MG tablet Commonly known as: TYLENOL  Take 2 tablets (650 mg total) by mouth every 6 (six) hours as needed for mild pain (pain score 1-3) (or Fever >/= 101).   albuterol  108 (90 Base) MCG/ACT inhaler Commonly known as: VENTOLIN  HFA Inhale 1 puff into the lungs every 6 (six) hours as needed for wheezing or shortness of breath.   albuterol  (2.5 MG/3ML) 0.083% nebulizer solution Commonly known as: PROVENTIL  Take 3 mLs by nebulization every 4 (four) hours as needed for wheezing or shortness of breath.   BARIATRIC MULTIVITAMINS/IRON  PO Take 1 tablet by mouth daily with breakfast.   Breztri Aerosphere 160-9-4.8 MCG/ACT Aero inhaler Generic drug: budesonide -glycopyrrolate -formoterol  Inhale 2 puffs into the lungs 2 (two) times daily.   bumetanide  1 MG tablet Commonly known as: BUMEX  Take 3 tablets (3 mg total) by mouth daily.   cetirizine  10 MG tablet Commonly known as: ZYRTEC  Take 1 tablet (10 mg total) by mouth daily.   clindamycin 1 % lotion Commonly known as: CLEOCIN T Apply topically  as needed.   docusate sodium  100 MG capsule Commonly known as: COLACE Take 200 mg by mouth as needed for mild constipation.   dofetilide  250 MCG capsule Commonly known as: TIKOSYN  Take 1 capsule (250 mcg total) by mouth 2 (two) times daily.   Dupixent  300 MG/2ML Soaj Generic drug: Dupilumab  Inject 300 mg into the skin every 14 (fourteen) days.   Eliquis  5 MG Tabs tablet Generic drug: apixaban  Take 1 tablet (5 mg total) by mouth 2 (two) times daily.   losartan  25 MG tablet Commonly known as: COZAAR  Take 1 tablet (25 mg total) by mouth daily.   magnesium  oxide 400 MG tablet Commonly known as: MAG-OX Take 1 tablet (400 mg total) by mouth daily.   melatonin 5 MG Tabs Take 2 tablets (10 mg total) by mouth at bedtime.   metolazone  5 MG tablet Commonly known as: ZAROXOLYN  Take 0.5 tablets (2.5 mg total) by mouth as needed. For volume overload. Take  an extra 40mEq of potassium with Metolazone  What changed:  how much to take when to take this reasons to take this additional instructions   metoprolol  succinate 25 MG 24 hr tablet Commonly known as: TOPROL -XL Take 2 tablets (50 mg total) by mouth daily. What changed:  how much to take when to take this   omeprazole 20 MG capsule Commonly known as: PRILOSEC Take 20 mg by mouth 2 (two) times daily before a meal.   polyethylene glycol 17 g packet Commonly known as: MIRALAX  / GLYCOLAX  Take 17 g by mouth as needed.   potassium chloride  SA 20 MEQ tablet Commonly known as: KLOR-CON  M Take 3 tablets (60 mEq total) by mouth 3 (three) times daily. Take an addition 2 tablet (40 meq) when you take metolazone  What changed: See the new instructions.   pregabalin  300 MG capsule Commonly known as: LYRICA  Take 300 mg by mouth 2 (two) times daily.   spironolactone  25 MG tablet Commonly known as: ALDACTONE  Take 2 tablets (50 mg total) by mouth daily.   SSD 1 % cream Generic drug: silver  sulfADIAZINE  Apply topically daily. Apply  to right side   Voltaren  Arthritis Pain 1 % Gel Generic drug: diclofenac  Sodium Apply 2 g topically 2 (two) times daily.   Xiidra  5 % Soln Generic drug: Lifitegrast  Place 1 drop into both eyes in the morning and at bedtime.   Zepbound 10 MG/0.5ML Pen Generic drug: tirzepatide Inject 10 mg into the skin once a week. Thursday       Allergies  Allergen Reactions   Gabapentin Anaphylaxis, Nausea And Vomiting and Other (See Comments)    Cannot walk   Topiramate  Other (See Comments)    Jaw tightness and chest discomfort    Albuterol  Other (See Comments)    CAN tolerate levalbuterol    Hydroxyzine  Other (See Comments)    QT prolongation related   Imitrex  [Sumatriptan ] Nausea And Vomiting and Other (See Comments)    Increased heart rate   Latex Itching   Metformin Other (See Comments)    Interaction with heart medications   Montelukast  Other (See Comments)    Nightmares    Other Itching and Other (See Comments)    Plastic and artificial Christmas trees = Itching   Prozac [Fluoxetine] Other (See Comments)    Bad, vivid dreams   Semaglutide  Nausea And Vomiting    nausea   Trazodone  And Nefazodone Other (See Comments)    Bad dreams 01/27/20 Pt is unsure if allergic to Nefazodone   Advair Diskus [Fluticasone -Salmeterol] Palpitations   Copper-Containing Compounds Rash and Other (See Comments)    Makes the face burn, also   Tape Rash    Redness and itching      The results of significant diagnostics from this hospitalization (including imaging, microbiology, ancillary and laboratory) are listed below for reference.    Significant Diagnostic Studies: DG Chest 1 View Result Date: 12/12/2023 CLINICAL DATA:  Atrial fibrillation. EXAM: CHEST  1 VIEW COMPARISON:  11/11/2023 FINDINGS: Lung volumes are low. Prominent heart size, likely accentuated by technique and not significantly changed. Bibasilar atelectasis. Left perihilar scarring. No pulmonary edema, large pleural effusion  or pneumothorax. IMPRESSION: Low lung volumes with bibasilar atelectasis. Electronically Signed   By: Andrea Gasman M.D.   On: 12/12/2023 18:00    Microbiology: No results found for this or any previous visit (from the past 240 hours).   Labs: Basic Metabolic Panel: Recent Labs  Lab 12/14/23 1104 12/14/23 1556 12/15/23 0547 12/15/23 1825 12/16/23  9545 12/16/23 1813 12/17/23 0425 12/18/23 0433  NA 137   < > 132* 129* 130* 131* 132* 131*  K 3.7   < > 3.3* 3.6 3.9 3.8 4.0 3.9  CL 97*   < > 94* 90* 92* 92* 94* 95*  CO2 29   < > 27 24 24 25 26 23   GLUCOSE 141*   < > 145* 191* 182* 185* 128* 155*  BUN 12   < > 14 17 17 20 18 14   CREATININE 1.01*   < > 0.90 1.19* 1.12* 1.06* 0.89 0.86  CALCIUM 8.9   < > 8.9 8.6* 8.6* 9.0 9.1 8.6*  MG 1.9  --  2.0  --  1.8  --  2.2 1.8   < > = values in this interval not displayed.   Liver Function Tests: No results for input(s): AST, ALT, ALKPHOS, BILITOT, PROT, ALBUMIN in the last 168 hours. No results for input(s): LIPASE, AMYLASE in the last 168 hours. No results for input(s): AMMONIA in the last 168 hours. CBC: Recent Labs  Lab 12/12/23 1721 12/13/23 0532 12/14/23 0751 12/15/23 0547 12/16/23 0454  WBC 8.3 10.0 8.6 8.8 9.8  HGB 11.6* 10.4* 10.6* 11.9* 11.5*  HCT 39.7 34.1* 35.0* 38.0 36.6  MCV 87.8 85.9 85.6 83.2 82.8  PLT 186 195 189 196 183   Cardiac Enzymes: No results for input(s): CKTOTAL, CKMB, CKMBINDEX, TROPONINI in the last 168 hours. BNP: BNP (last 3 results) Recent Labs    08/26/23 1122 08/31/23 1217 12/12/23 2340  BNP 51.8 13.2 205.5*    ProBNP (last 3 results) Recent Labs    11/11/23 0910  PROBNP 195.0    CBG: Recent Labs  Lab 12/17/23 0744 12/17/23 1159 12/17/23 1541 12/17/23 2147 12/18/23 0733  GLUCAP 124* 160* 162* 177* 224*       Signed:  Sigurd Pac MD.  [facility] 12/18/2023, 9:54 AM

## 2023-12-18 NOTE — Telephone Encounter (Signed)
 Patient called to report she did not have the cardioversion but will need to have six fillings on 10/27.  Patient wants get clearance to have the dental work.

## 2023-12-18 NOTE — Progress Notes (Signed)
 Physical Therapy Treatment/ discharge Patient Details Name: Alyssa Blanchard MRN: [id number] DOB: 06/15/1970 Today's Date: 12/18/2023   History of Present Illness 53 yo F adm 12/12/23 with rapid Afib. PMHx: obesity, HFpEF,  PAF on Eliquis , asthma, T2DM, HTN, anemia, bipolar disorder    PT Comments  Pt pleasant with increased activity tolerance without SOB, dizziness or fatigue with limited gait. Pt with steady gait with use of cane, performing basic transfers and stairs without assist. Pt back to baseline with HR 94-104 with activity and SPO2 100% on RA. No further therapy needs at this time with education for progressive walking program provided. Will sign off with pt aware and agreeable.     If plan is discharge home, recommend the following: Assistance with cooking/housework   Can travel by private vehicle        Equipment Recommendations  None recommended by PT    Recommendations for Other Services       Precautions / Restrictions Precautions Precautions: None     Mobility  Bed Mobility Overal bed mobility: Modified Independent             General bed mobility comments: HOB 40 degrees with use of rail to rise and pivot to EOB, pt with hospital bed at home    Transfers Overall transfer level: Modified independent                      Ambulation/Gait Ambulation/Gait assistance: Modified independent (Device/Increase time) Gait Distance (Feet): 250 Feet Assistive device: Straight cane Gait Pattern/deviations: Step-through pattern, Decreased stride length   Gait velocity interpretation: >2.62 ft/sec, indicative of community ambulatory   General Gait Details: decreased speed with steady gait with use of cane, pt self-regulating distance with HR 94-104 with gait, SPO2 100% on RA   Stairs Stairs: Yes Stairs assistance: Modified independent (Device/Increase time) Stair Management: Step to pattern, Forwards, One rail Left, With cane Number of Stairs:  4 General stair comments: reliance on cane and rail with step to pattern   Wheelchair Mobility     Tilt Bed    Modified Rankin (Stroke Patients Only)       Balance Overall balance assessment: Mild deficits observed, not formally tested                                          Communication Communication Communication: No apparent difficulties  Cognition Arousal: Alert Behavior During Therapy: WFL for tasks assessed/performed   PT - Cognitive impairments: No apparent impairments                         Following commands: Intact      Cueing Cueing Techniques: Verbal cues  Exercises      General Comments        Pertinent Vitals/Pain Pain Assessment Pain Assessment: No/denies pain    Home Living                          Prior Function            PT Goals (current goals can now be found in the care plan section) Progress towards PT goals: Goals met/education completed, patient discharged from PT    Frequency           PT Plan      Co-evaluation  AM-PAC PT 6 Clicks Mobility   Outcome Measure  Help needed turning from your back to your side while in a flat bed without using bedrails?: None Help needed moving from lying on your back to sitting on the side of a flat bed without using bedrails?: None Help needed moving to and from a bed to a chair (including a wheelchair)?: None Help needed standing up from a chair using your arms (e.g., wheelchair or bedside chair)?: None Help needed to walk in hospital room?: None Help needed climbing 3-5 steps with a railing? : None 6 Click Score: 24    End of Session   Activity Tolerance: Patient tolerated treatment well Patient left: in bed;with call bell/phone within reach Nurse Communication: Mobility status PT Visit Diagnosis: Other abnormalities of gait and mobility (R26.89)     Time: 9141-9086 PT Time Calculation (min) (ACUTE ONLY): 15  min  Charges:    $Gait Training: 8-22 mins PT General Charges $$ ACUTE PT VISIT: 1 Visit                     Lenoard SQUIBB, PT Acute Rehabilitation Services Office: [phone]    Lenoard NOVAK Alyssa Blanchard 12/18/2023, 10:02 AM

## 2023-12-18 NOTE — Progress Notes (Addendum)
 Pharmacy: Dofetilide  (Tikosyn ) - Follow Up Assessment and Electrolyte Replacement  Pharmacy consulted to assist in monitoring and replacing electrolytes in this 53 y.o. female admitted on 12/12/2023 undergoing dofetilide  initiation.   Labs:    Component Value Date/Time   K 3.9 12/18/2023 0433   MG 1.8 12/18/2023 0433     Plan: Potassium: -Based on daily requirements will give 80meq this morning and 60meq before she leaves  Magnesium : -Mg 2gm   As patient has required on average 200 mEq of potassium replacement every day, recommend discharging patient with prescription for:  Potassium chloride  60 mEq tid  Thank you for allowing pharmacy to participate in this patient's care  [name], PharmD Clinical Pharmacist **Pharmacist phone directory can now be found on [email or web address] ([email or web address]).  Listed under [facility].

## 2023-12-18 NOTE — Progress Notes (Addendum)
 Electrophysiology Rounding Note  Patient Name: Alyssa Blanchard Date of Encounter: 12/18/2023  Primary Cardiologist: Annabella Scarce, MD  Electrophysiologist: Eulas FORBES Furbish, MD    Subjective   Pt remains in NSR on Tikosyn  250 mcg BID   QTc from EKG last pm shows improved QTc at ~466 when measured manually  The patient is doing well today.  At this time, the patient denies chest pain, shortness of breath, or any new concerns.  Inpatient Medications    Scheduled Meds:  apixaban   5 mg Oral BID   budesonide -glycopyrrolate -formoterol   2 puff Inhalation BID   bumetanide   3 mg Oral Daily   dofetilide   250 mcg Oral BID   insulin  aspart  0-5 Units Subcutaneous QHS   insulin  aspart  0-6 Units Subcutaneous TID WC   Lifitegrast   1 drop Both Eyes BID   loratadine   10 mg Oral Daily   melatonin  10 mg Oral QHS   metoprolol  succinate  50 mg Oral QHS   pantoprazole   40 mg Oral BID AC   potassium chloride   20 mEq Oral Once   potassium chloride   60 mEq Oral TID   pregabalin   300 mg Oral BID   sodium chloride  flush  3 mL Intravenous Q12H   sodium chloride  flush  3 mL Intravenous Q12H   spironolactone   50 mg Oral Daily   Continuous Infusions:  magnesium  sulfate bolus IVPB     PRN Meds: acetaminophen  **OR** acetaminophen , albuterol , polyethylene glycol, prochlorperazine , sodium chloride  flush, traMADol   Vital Signs    Vitals:   12/18/23 0011 12/18/23 0340 12/18/23 0735 12/18/23 0743  BP: 119/61  135/70   Pulse:   77 86  Resp: 16 16 16 18   Temp: 97.6 F (36.4 C) 97.7 F (36.5 C) 98 F (36.7 C)   TempSrc: Axillary Oral Oral   SpO2:   97% 98%  Weight:  (!) 169.5 kg    Height:        Intake/Output Summary (Last 24 hours) at 12/18/2023 0804 Last data filed at 12/18/2023 0736 Gross per 24 hour  Intake 954 ml  Output --  Net 954 ml   Filed Weights   12/13/23 0500 12/18/23 0340  Weight: (!) 178.3 kg (!) 169.5 kg    Physical Exam    GEN- NAD, A&O x 3. Normal  affect.  Lungs- CTAB, Normal effort.  Heart- Regular rate and rhythm. No M/G/R GI- Soft, NT, ND Extremities- No clubbing, cyanosis, or edema Skin- no rash or lesion  Labs    CBC Recent Labs    12/16/23 0454  WBC 9.8  HGB 11.5*  HCT 36.6  MCV 82.8  PLT 183   Basic Metabolic Panel Recent Labs    89/90/74 0425 12/18/23 0433  NA 132* 131*  K 4.0 3.9  CL 94* 95*  CO2 26 23  GLUCOSE 128* 155*  BUN 18 14  CREATININE 0.89 0.86  CALCIUM 9.1 8.6*  MG 2.2 1.8    Telemetry    NSR 70-80s (personally reviewed)  Patient Profile     Alyssa Blanchard is a 53 y.o. female with a past medical history significant for persistent atrial fibrillation.  They were admitted for tikosyn  load.   Assessment & Plan    Persistent atrial fibrillation Pt remains in NSR on Tikosyn  250 mcg BID  Continue Eliquis  Creatinine, ser  0.86 (10/10 0433) Magnesium   1.8 (10/10 0433) Potassium3.9 (10/10 0433) Supplement both K and Mg per protocol  Discharged teaching completed  and AF clinic follow up in place.  OK for d/c from an EP perspective.   HFpEF Reviewed diuretic regimen with Dr. Zenaida as well.  Continue bumex  3 mg daily Change potassium to 60 meq TID for better absorption Change metolazone  2.5 mg to AS NEEDED  Take extra 40 meq of potassium when taking a metolazone   For questions or updates, please contact [facility] Please consult [email or web address] for contact info under Cardiology/STEMI.  Signed, Alyssa Prentice Passey, PA-C  12/18/2023, 8:04 AM    Alyssa Blanchard was seen by me today along with Alyssa Blanchard. I have personally performed an evaluation on this patient.  My findings are as follows: 53 y.o. female patient admitted to the hospital and was found to have atrial fibrillation.  She was loaded on dofetilide .  She has remained in sinus rhythm.  Feels well today..  Data: EKG(s) and pertinent labs, studies, etc were personally reviewed and interpreted by me:  EKG,  telemetry Otherwise, I agree with data as outlined by the advanced practice provider.  Exam performed by me: Gen: Well-appearing Neck: No JVD Cardiac: Regular rhythm Lungs: Normal work of breathing Extremities: No edema  My Assessment and Plan: 1.  Persistent atrial fibrillation: Remains in sinus rhythm.  On dofetilide .  Alyssa Blanchard continue dofetilide .  Okay for discharge today.  Alyssa Blanchard arrange for follow-up in A-fib clinic.  2.  Secondary hypercoagulable state: Continue Eliquis   3.  Acute diastolic heart failure: Has been diuresed.  No obvious volume overload.  Metolazone  as above.  Signed,  Beverley Allender Gladis Norton, MD  12/18/2023 9:12 AM

## 2023-12-18 NOTE — Discharge Instructions (Signed)
 Dofetilide  Capsules What is this medication? DOFETILIDE  (doe FET il ide) treats a fast or irregular heartbeat (arrhythmia). It works by slowing down overactive electric signals in the heart, which stabilizes your heart rhythm. It belongs to a group of medications called antiarrhythmics. This medicine may be used for other purposes; ask your health care provider or pharmacist if you have questions. COMMON BRAND NAME(S): Tikosyn  What should I tell my care team before I take this medication? They need to know if you have any of these conditions: Heart disease History of irregular heartbeat History of low levels of potassium or magnesium  in the blood Kidney disease Liver disease An unusual or allergic reaction to dofetilide , other medications, foods, dyes, or preservatives Pregnant or trying to get pregnant Breast-feeding How should I use this medication? Take this medication by mouth with a glass of water. Follow the directions on the prescription label. Do not take with grapefruit juice. You can take it with or without food. If it upsets your stomach, take it with food. Take your medication at regular intervals. Do not take it more often than directed. Do not stop taking except on your care team's advice. A special MedGuide will be given to you by the pharmacist with each prescription and refill. Be sure to read this information carefully each time. Talk to your care team about the use of this medication in children. Special care may be needed. Overdosage: If you think you have taken too much of this medicine contact a poison control center or emergency room at once. NOTE: This medicine is only for you. Do not share this medicine with others. What if I miss a dose? If you miss a dose, skip it. Take your next dose at the normal time. Do not take extra or 2 doses at the same time to make up for the missed dose. What may interact with this medication? Do not take this medication with any of  the following: Benadryl (Diphenhydramine) Cimetidine (Tagamet) Cisapride Dolutegravir Dronedarone Erdafitinib Hydrochlorothiazide Immodium Ketoconazole Megestrol Pimozide Prochlorperazine Thioridazine Trimethoprim Verapamil This medication may also interact with the following: Amiloride Cannabinoids Certain antibiotics like erythromycin or clarithromycin  Certain antiviral medications for HIV or hepatitis Certain medications for depression, anxiety, or psychotic disorders Digoxin  Diltiazem  Grapefruit juice Metformin  Nefazodone Other medications that prolong the QT interval (an abnormal heart rhythm) Quinine Triamterene Zafirlukast Ziprasidone This list may not describe all possible interactions. Give your health care provider a list of all the medicines, herbs, non-prescription drugs, or dietary supplements you use. Also tell them if you smoke, drink alcohol, or use illegal drugs. Some items may interact with your medicine. What should I watch for while using this medication? Your condition will be monitored carefully while you are receiving this medication. What side effects may I notice from receiving this medication? Side effects that you should report to your care team as soon as possible: Allergic reactions--skin rash, itching, hives, swelling of the face, lips, tongue, or throat Chest pain Heart rhythm changes--fast or irregular heartbeat, dizziness, feeling faint or lightheaded, chest pain, trouble breathing Side effects that usually do not require medical attention (report to your care team if they continue or are bothersome): Dizziness Headache Nausea Stomach pain Trouble sleeping This list may not describe all possible side effects. Call your doctor for medical advice about side effects. You may report side effects to FDA at 1-800-FDA-1088. Where should I keep my medication? Keep out of the reach of children. Store at room temperature between 15  and 30  degrees C (59 and 86 degrees F). Throw away any unused medication after the expiration date. NOTE: This sheet is a summary. It may not cover all possible information. If you have questions about this medicine, talk to your doctor, pharmacist, or health care provider.  2024 Elsevier/Gold Standard (2021-01-25 00:00:00)

## 2023-12-18 NOTE — Telephone Encounter (Signed)
 I will update the preop APP and Reche Finder, NP who pt has appt with 12/22/23 hospital f/u   Se the clearance request in the chart 12/09/23

## 2023-12-22 ENCOUNTER — Ambulatory Visit (HOSPITAL_BASED_OUTPATIENT_CLINIC_OR_DEPARTMENT_OTHER): Admitting: Family

## 2023-12-25 ENCOUNTER — Inpatient Hospital Stay (INDEPENDENT_AMBULATORY_CARE_PROVIDER_SITE_OTHER): Admit: 2023-12-25 | Discharge: 2023-12-25 | Disposition: A | Attending: Internal Medicine | Admitting: Internal Medicine

## 2023-12-25 ENCOUNTER — Encounter (HOSPITAL_COMMUNITY): Payer: Self-pay | Admitting: Internal Medicine

## 2023-12-25 VITALS — BP 118/76 | HR 70 | Ht 65.0 in | Wt 396.8 lb

## 2023-12-25 DIAGNOSIS — D6869 Other thrombophilia: Secondary | ICD-10-CM

## 2023-12-25 DIAGNOSIS — Z5181 Encounter for therapeutic drug level monitoring: Secondary | ICD-10-CM

## 2023-12-25 DIAGNOSIS — Z79899 Other long term (current) drug therapy: Secondary | ICD-10-CM | POA: Diagnosis not present

## 2023-12-25 DIAGNOSIS — I480 Paroxysmal atrial fibrillation: Secondary | ICD-10-CM

## 2023-12-25 NOTE — Progress Notes (Addendum)
 Primary Care Physician: Nikki Rams, Aliene, MD Primary Cardiologist: Annabella Scarce, MD Electrophysiologist: Eulas FORBES Furbish, MD     Referring Physician: Vannie Reche RAMAN, NP     Alyssa Blanchard is a 53 y.o. female with a history of obesity s/p gastric bypass, CHF, OSA, T2DM, pulmonary hypertension, and atrial fibrillation who presents for consultation in the [facility]. Seen by Cardiology on 9/23 and patient has noted increased episodes of Afib. History of Tikosyn  ([facility]). Patient is on Eliquis  for a CHADS2VASC score of 3.  On follow up 12/25/23, patient is here for 1 week Tikosyn  surveillance. She was seen by me on 9/29 and preparing her for Tikosyn  admission due to flecainide  failure. She then presented to ED on 10/4 due to Afib and subsequently admitted for Tikosyn  load. QT prolongation required dose reduction to 250 mcg BID. Discharged on 10/10 on Tikosyn  250 mcg BID. She converted chemically to NSR. Her diuretic regimen was adjusted in hospital. Review of weight on day of discharge shows patient has gained ~10 kg since then. She has not taken metolazone  prn since being out of hospital.   Today, she denies symptoms of chest pain, orthopnea, PND, dizziness, presyncope, syncope, bleeding, or neurologic sequela. The patient is tolerating medications without difficulties and is otherwise without complaint today.    Atrial Fibrillation Risk Factors:  she does have symptoms or diagnosis of sleep apnea. she is compliant with CPAP therapy.   she has a BMI of Body mass index is 66.03 kg/m.SABRA Filed Weights   12/25/23 1009  Weight: (!) 180 kg     Current Outpatient Medications  Medication Sig Dispense Refill   acetaminophen  (TYLENOL ) 325 MG tablet Take 2 tablets (650 mg total) by mouth every 6 (six) hours as needed for mild pain (pain score 1-3) (or Fever >/= 101).     albuterol  (VENTOLIN  HFA) 108 (90 Base) MCG/ACT inhaler Inhale 1 puff into the  lungs every 6 (six) hours as needed for wheezing or shortness of breath.     apixaban  (ELIQUIS ) 5 MG TABS tablet Take 1 tablet (5 mg total) by mouth 2 (two) times daily. 60 tablet 0   budesonide -glycopyrrolate -formoterol  (BREZTRI AEROSPHERE) 160-9-4.8 MCG/ACT AERO inhaler Inhale 2 puffs into the lungs 2 (two) times daily.     bumetanide  (BUMEX ) 1 MG tablet Take 3 tablets (3 mg total) by mouth daily. 90 tablet 6   cetirizine  (ZYRTEC ) 10 MG tablet Take 1 tablet (10 mg total) by mouth daily. 30 tablet 0   clindamycin (CLEOCIN T) 1 % lotion Apply topically as needed.     diclofenac  Sodium (VOLTAREN  ARTHRITIS PAIN) 1 % GEL Apply 2 g topically 2 (two) times daily. 50 g 0   docusate sodium  (COLACE) 100 MG capsule Take 200 mg by mouth as needed for mild constipation.     dofetilide  (TIKOSYN ) 250 MCG capsule Take 1 capsule (250 mcg total) by mouth 2 (two) times daily. 60 capsule 6   Dupilumab  (DUPIXENT ) 300 MG/2ML SOAJ Inject 300 mg into the skin every 14 (fourteen) days.     Lifitegrast  (XIIDRA ) 5 % SOLN Place 1 drop into both eyes in the morning and at bedtime.     losartan  (COZAAR ) 25 MG tablet Take 1 tablet (25 mg total) by mouth daily. 90 tablet 3   magnesium  oxide (MAG-OX) 400 MG tablet Take 1 tablet (400 mg total) by mouth daily. 30 tablet 0   melatonin 5 MG TABS Take 2 tablets (10 mg total)  by mouth at bedtime. 60 tablet 0   metolazone  (ZAROXOLYN ) 5 MG tablet Take 0.5 tablets (2.5 mg total) by mouth as needed. For volume overload. Take an extra 40mEq of potassium with Metolazone      metoprolol  succinate (TOPROL -XL) 25 MG 24 hr tablet Take 2 tablets (50 mg total) by mouth daily. (Patient taking differently: Take 25 mg by mouth 2 (two) times daily.) 180 tablet 3   Multiple Vitamins-Minerals (BARIATRIC MULTIVITAMINS/IRON  PO) Take 1 tablet by mouth daily with breakfast.     omeprazole (PRILOSEC) 20 MG capsule Take 20 mg by mouth 2 (two) times daily before a meal.     polyethylene glycol (MIRALAX  /  GLYCOLAX ) 17 g packet Take 17 g by mouth as needed.     potassium chloride  SA (KLOR-CON  M) 20 MEQ tablet Take 3 tablets (60 mEq total) by mouth 3 (three) times daily. Take an addition 2 tablet (40 meq) when you take metolazone  300 tablet 6   pregabalin  (LYRICA ) 300 MG capsule Take 300 mg by mouth 2 (two) times daily.     silver  sulfADIAZINE  (SILVADENE ) 1 % cream Apply topically daily. Apply to right side 50 g 0   spironolactone  (ALDACTONE ) 25 MG tablet Take 2 tablets (50 mg total) by mouth daily. 90 tablet 3   tirzepatide (ZEPBOUND) 10 MG/0.5ML Pen Inject 10 mg into the skin once a week. Thursday     No current facility-administered medications for this encounter.   Facility-Administered Medications Ordered in Other Encounters  Medication Dose Route Frequency Provider Last Rate Last Admin   acetaminophen  (TYLENOL ) tablet 650 mg  650 mg Oral Once Thayil, Irene T, PA-C       ferric derisomaltose  (MONOFERRIC ) 1,000 mg in sodium chloride  0.9 % 100 mL infusion  1,000 mg Intravenous Once Thayil, Irene T, PA-C        Atrial Fibrillation Management history:  Previous antiarrhythmic drugs: flecainide  pill in pocket, tikosyn   Previous cardioversions: 04/28/23 Previous ablations: none Anticoagulation history: Eliquis    ROS- All systems are reviewed and negative except as per the HPI above.  Physical Exam: BP 118/76   Pulse 70   Ht 5' 5 (1.651 m)   Wt (!) 180 kg   LMP 12/20/2017   BMI 66.03 kg/m   GEN- The patient is well appearing, alert and oriented x 3 today.   Neck - no JVD or carotid bruit noted Lungs- Clear to ausculation bilaterally, normal work of breathing Heart- Regular rate and rhythm, no murmurs, rubs or gallops, PMI not laterally displaced Extremities- no clubbing, cyanosis, or edema Skin - no rash or ecchymosis noted   EKG today demonstrates  Vent. rate 70 BPM PR interval 168 ms QRS duration 80 ms QT/QTcB 426/460 ms P-R-T axes -17 2 23  Normal sinus rhythm Cannot  rule out Anterior infarct , age undetermined Abnormal ECG When compared with ECG of 18-Dec-2023 10:11, No significant change was found  Echo 04/24/23 demonstrated  1. Left ventricular ejection fraction, by estimation, is 70 to 75%. The  left ventricle has hyperdynamic function. The left ventricle has no  regional wall motion abnormalities. There is mild left ventricular  hypertrophy. Left ventricular diastolic  parameters were normal (medial e' 9.7 cm/s).   2. Right ventricular systolic function is normal. The right ventricular  size is normal. There is mildly elevated pulmonary artery systolic  pressure. The estimated right ventricular systolic pressure is 38.5 mmHg.   3. The mitral valve is grossly normal. Trivial mitral valve  regurgitation. No evidence of  mitral stenosis.   4. The aortic valve was not well visualized. There is mild calcification  of the aortic valve. Aortic valve regurgitation is not visualized. No  aortic stenosis is present.   5. The inferior vena cava is dilated in size with >50% respiratory  variability, suggesting right atrial pressure of 8 mmHg.   6. Increased flow velocities may be secondary to anemia, thyrotoxicosis,  hyperdynamic or high flow state.   ASSESSMENT & PLAN CHA2DS2-VASc Score = 4  The patient's score is based upon: CHF History: 1 HTN History: 1 Diabetes History: 1 Stroke History: 0 Vascular Disease History: 0 Age Score: 0 Gender Score: 1       ASSESSMENT AND PLAN: Paroxysmal Atrial Fibrillation (ICD10:  I48.0) The patient's CHA2DS2-VASc score is 4, indicating a 4.8% annual risk of stroke.   S/p Tikosyn  load 10/4-12/2023.  Patient is currently in NSR. Continue Toprol  25 mg BID.   High risk medication monitoring (ICD10: [id number]) Patient requires ongoing monitoring for anti-arrhythmic medication which has the potential to cause life threatening arrhythmias or AV block. Qtc stable. Continue Tikosyn  250 mcg BID. Bmet and mag drawn  today.  Secondary Hypercoagulable State (ICD10:  D68.69) The patient is at significant risk for stroke/thromboembolism based upon her CHA2DS2-VASc Score of 4.  Continue Apixaban  (Eliquis ).  Continue Eliquis .  Chronic diastolic HF Recommended to patient to take her metolazone  PRN (and potassium) as directed for weight gain noted since hospital discharge. She has f/u scheduled with HF clinic.     Follow up 1 month for Tikosyn  surveillance.   Terra Pac, [facility]  Afib Clinic [address] [place], [place] [address] [phone]

## 2023-12-26 LAB — BASIC METABOLIC PANEL WITH GFR
BUN/Creatinine Ratio: 14 (ref 9–23)
BUN: 11 mg/dL (ref 6–24)
CO2: 21 mmol/L (ref 20–29)
Calcium: 8.9 mg/dL (ref 8.7–10.2)
Chloride: 105 mmol/L (ref 96–106)
Creatinine, Ser: 0.79 mg/dL (ref 0.57–1.00)
Glucose: 136 mg/dL — ABNORMAL HIGH (ref 70–99)
Potassium: 4.5 mmol/L (ref 3.5–5.2)
Sodium: 142 mmol/L (ref 134–144)
eGFR: 89 mL/min/1.73 (ref >59)

## 2023-12-26 LAB — MAGNESIUM: Magnesium: 2 mg/dL (ref 1.6–2.3)

## 2023-12-28 ENCOUNTER — Ambulatory Visit (HOSPITAL_COMMUNITY): Admitting: Internal Medicine

## 2023-12-28 ENCOUNTER — Ambulatory Visit (HOSPITAL_COMMUNITY): Payer: Self-pay | Admitting: Internal Medicine

## 2023-12-28 ENCOUNTER — Encounter (HOSPITAL_COMMUNITY): Payer: Self-pay

## 2024-01-04 NOTE — Progress Notes (Signed)
 Unable to leave message

## 2024-01-05 NOTE — Telephone Encounter (Signed)
 Last Office Visit 7/25 Next Office Visit 1/26 Refilled per protocol Eleanor Loyal Mabe, CMA

## 2024-01-05 NOTE — Telephone Encounter (Signed)
 Patient called back and results were given

## 2024-01-06 ENCOUNTER — Telehealth (HOSPITAL_COMMUNITY): Payer: Self-pay

## 2024-01-06 ENCOUNTER — Telehealth (HOSPITAL_COMMUNITY): Payer: Self-pay | Admitting: Cardiology

## 2024-01-06 NOTE — Telephone Encounter (Signed)
 Pt aware and voiced understanding  Requested that labs be done at f/u 11/7

## 2024-01-06 NOTE — Telephone Encounter (Signed)
 Patient called to make sure she could take doxycycline  with Tikosyn .

## 2024-01-06 NOTE — Telephone Encounter (Signed)
 Patient called to report abdominal swelling  Reports weight has increased  Normal weight 377 Weight today 403 (weight increase has taken place over the past 1 and a half)  SOB + Reports increase in lower abdominal swelling,blisters are forming, swelling is traveling up stomach  Reports compliance with medication  Reports was advised to take metolazone  2.5 10/10 at EP follow up. Pt reports minimal relief   Pt reports she is currently in the hospital as a guest with her mother, reports stress of situation may have put her back in afib last night  HFU scheduled 11/7  Please advise

## 2024-01-08 ENCOUNTER — Ambulatory Visit (HOSPITAL_COMMUNITY): Admitting: Internal Medicine

## 2024-01-08 ENCOUNTER — Other Ambulatory Visit: Payer: Self-pay | Admitting: Medical Genetics

## 2024-01-08 DIAGNOSIS — Z006 Encounter for examination for normal comparison and control in clinical research program: Secondary | ICD-10-CM

## 2024-01-11 ENCOUNTER — Other Ambulatory Visit (HOSPITAL_COMMUNITY): Payer: Self-pay

## 2024-01-11 ENCOUNTER — Encounter (HOSPITAL_COMMUNITY): Payer: Self-pay

## 2024-01-11 ENCOUNTER — Other Ambulatory Visit: Payer: Self-pay

## 2024-01-11 MED ORDER — FUROSCIX 80 MG/10ML ~~LOC~~ CTKT
80.0000 mg | CARTRIDGE | Freq: Once | SUBCUTANEOUS | 0 refills | Status: DC
  Filled 2024-01-11: qty 1, 1d supply, fill #0

## 2024-01-11 NOTE — Telephone Encounter (Signed)
 Patient repots abdomin is weeping.  Reports weight 412   This morning weight is 406.    Patient reports taking metolazone  5 mg for 2 days. She states weight has came down some. Abdomin is still swelling and weeping. Swelling is up to chest area; Some swelling in legs.  Patients she is having shortness of breath.

## 2024-01-11 NOTE — Addendum Note (Signed)
 Addended by: ELINDA ROLIN SAILOR on: 01/11/2024 02:51 PM   Modules accepted: Orders

## 2024-01-11 NOTE — Telephone Encounter (Signed)
 Patient states she cannot go to the hospital today. He is currently at the hospital with her mother. Patient would like to know if she can try furoscix  again?

## 2024-01-11 NOTE — Progress Notes (Signed)
 Specialty Pharmacy Initial Fill Coordination Note  Alyssa Blanchard is a 53 y.o. female contacted today regarding initial fill of specialty medication(s) Furosemide  (Furoscix )   Patient requested [name] at [facility] at [place] date: 01/11/24   Medication will be filled on: 01/11/24    Patient is aware of $4 copayment.   Disenrolling as this is a one time fill.

## 2024-01-11 NOTE — Telephone Encounter (Signed)
 Patient notified. Lab appointment scheduled for tomorrow.

## 2024-01-12 ENCOUNTER — Ambulatory Visit (HOSPITAL_COMMUNITY)
Admission: RE | Admit: 2024-01-12 | Discharge: 2024-01-12 | Disposition: A | Discharge status: Home / Self Care | Source: Ambulatory Visit | Attending: Cardiology | Admitting: Cardiology

## 2024-01-12 DIAGNOSIS — I5032 Chronic diastolic (congestive) heart failure: Secondary | ICD-10-CM | POA: Diagnosis present

## 2024-01-12 LAB — BASIC METABOLIC PANEL WITH GFR
Anion gap: 12 (ref 5–15)
BUN: 10 mg/dL (ref 6–20)
CO2: 23 mmol/L (ref 22–32)
Calcium: 8.7 mg/dL — ABNORMAL LOW (ref 8.9–10.3)
Chloride: 102 mmol/L (ref 98–111)
Creatinine, Ser: 0.74 mg/dL (ref 0.44–1.00)
GFR, Estimated: >60 mL/min (ref >60)
Glucose, Bld: 128 mg/dL — ABNORMAL HIGH (ref 70–99)
Potassium: 3.8 mmol/L (ref 3.5–5.1)
Sodium: 137 mmol/L (ref 135–145)

## 2024-01-15 ENCOUNTER — Encounter (HOSPITAL_COMMUNITY)

## 2024-01-15 NOTE — Progress Notes (Incomplete)
 ADVANCED HEART FAILURE CLINIC NOTE  Primary Care: [name], MD Primary Cardiologist: Dr. Zenaida  HPI: Alyssa Blanchard is a 53 y.o. female with a PMH of HFpEF, untreated OSA/OHS, asthma, PAF, morbid obesity w/ prior laparoscopic sleeve gastrectomy in 2016, DM II, and HTN.  Admitted to [facility] in 1/25 with acute on chronic HFpEF and RLE cellulitis.   She was readmitted to [facility] in 2/25 with volume overload, Afib with RVR. Echo with EF 70-75%, RV okay, RVSP 39 mmHg. Diuresed with IV lasix . Later underwent TEE/DCCV to SR. She was also treated for norovirus, RLE cellulitis, E. Coli UTI then candida vaginosis. Discharged to CIR, weight 392 lbs.   She saw [facility] AHF/transplant team for consult 05/29/23.   Admitted multiple times in 2025 for volume overload.  Follow up 07/27/23, had gained 24 lbs in 2 months. She was prescribed furoscix  and metolazone  x 3 days.   Seen in ED 08/18/23 for AF. Urgent follow up 08/19/23, Bumex  increased to 3 mg bid, continued metolazone  3x/week.  Follow up with EP 6/25, given flecainide  for PRN use for AF episodes. Not an ablation candidate at current weight.     SUBJECTIVE:  Today she returns for HF follow up. She remains SOB walking short distances on flat ground, more-so when she is in AF. She has CP when she is in AF and HR >120. Denies abnormal bleeding, dizziness, or PND/Orthopnea. Appetite ok. Taking all medications. Has only been taking Bumex  3 mg daily, she was dizzy on bid dose. Weight at home 373 pounds. Now on Zepbound, down 25 lbs in last 2 weeks. Has CPAP now, has not started wearing it yet.  PMH, current medications, allergies, social history, and family history reviewed in epic.  Wt Readings from Last 3 Encounters:  12/25/23 (!) 180 kg (396 lb 12.8 oz)  12/18/23 (!) 169.5 kg (373 lb 9.6 oz)  12/07/23 (!) 176.3 kg (388 lb 9.6 oz)   LMP 12/20/2017   PHYSICAL EXAM: There were no vitals filed for this visit. GENERAL: NAD Lungs-  *** CARDIAC:  JVP: *** cm          Normal rate with regular rhythm. *** murmur.  Pulses ***. *** edema.  ABDOMEN: Soft, non-tender, non-distended.  EXTREMITIES: Warm and well perfused.  NEUROLOGIC: No obvious FND    DATA REVIEW  ECG: 08/18/23: NSR 06/01/2023: Normal sinus rhythm  ECHO: 04/24/2023: LVEF 70 to 75%, normal E prime, normal RV systolic function, mildly elevated RVSP, no gross valvular abnormalities  CATH: R/LHC at [facility] 9/23: RA mean 24, PA mean 49, PCWP mean 34, PVR 1.3 WU, Fick CI 2.01, TD CI 4.07, LVEDP 32, no CAD    ASSESSMENT & PLAN:  1. Chronic diastolic heart failure: Multiple readmissions, volume status obviously difficult given body habitus. Suspect largely due to morbid obesity and untreated sleep apnea. - NYHA III. Body habitus contributing. Weight down with addition on GLP1, does not appear markedly volume overloaded. - Continue Bumex  3 mg daily (orthostatic on 3 mg bid) + metolazone  5 mg/extra 40 KCL 3x/week. - Continue spironolactone  50 mg daily. - Continue Toprol  XL 50 mg daily. - Not a Zoll HFMS nor Cardiomems candidate given insurance - Labs today.  2. Morbid obesity: Previous gastric sleeve. - There is no height or weight on file to calculate BMI. - Now on GLP-1, down 25 lbs in past 2 weeks  3. PAF: Previously on tikosyn , s/p DCCV 2/25.  - 2 day holter monitor (3/25, [facility]):  mostly  NSR, frequent short runs of SVT, rare PVCs and PACs - Now established with [facility] EP, and plugged into AF clinic - Regular on exam today. - Continue Eliquis  5 mg bid, no bleeding issues - Continue beta blocker - She has flecainide  for PRN use  4. HTN: BP stable - Continue current meds - Could consider ARB/ARNi, but will hold off weigh weight loss.  5. OSA:  - Sleep study at [facility] in 05/25 with moderate OSA - Has CPAP, planning on starting soon    Follow up in 3-4 months with Dr. Zenaida Caffie Shed, PA-C  Advanced Heart Failure 01/15/24

## 2024-01-16 ENCOUNTER — Other Ambulatory Visit (HOSPITAL_COMMUNITY): Payer: Self-pay

## 2024-01-18 ENCOUNTER — Telehealth (HOSPITAL_COMMUNITY): Payer: Self-pay

## 2024-01-18 NOTE — Telephone Encounter (Signed)
 Called to confirm/remind patient of their appointment at the Advanced Heart Failure Clinic on 01/19/24.   Appointment:   [x] Confirmed  [] Left mess   [] No answer/No voice mail  [] VM Full/unable to leave message  [] Phone not in service  Patient reminded to bring all medications and/or complete list.  Confirmed patient has transportation. Gave directions, instructed to utilize valet parking.

## 2024-01-19 ENCOUNTER — Ambulatory Visit (HOSPITAL_COMMUNITY): Admission: RE | Admit: 2024-01-19 | Source: Ambulatory Visit

## 2024-01-21 ENCOUNTER — Telehealth (HOSPITAL_COMMUNITY): Payer: Self-pay

## 2024-01-21 NOTE — Telephone Encounter (Signed)
 Called to confirm/remind patient of their appointment at the Advanced Heart Failure Clinic on 01/22/24.   Appointment:   [] Confirmed  [x] Left mess   [] No answer/No voice mail  [] VM Full/unable to leave message  [] Phone not in service  And to bring in all medications and/or complete list.

## 2024-01-22 ENCOUNTER — Ambulatory Visit (HOSPITAL_COMMUNITY)
Admission: RE | Admit: 2024-01-22 | Discharge: 2024-01-22 | Disposition: A | Discharge status: Home / Self Care | Source: Ambulatory Visit | Attending: Family Medicine | Admitting: Family Medicine

## 2024-01-22 ENCOUNTER — Other Ambulatory Visit (HOSPITAL_COMMUNITY): Payer: Self-pay

## 2024-01-22 ENCOUNTER — Ambulatory Visit (HOSPITAL_COMMUNITY): Payer: Self-pay | Admitting: Family Medicine

## 2024-01-22 ENCOUNTER — Other Ambulatory Visit: Payer: Self-pay

## 2024-01-22 ENCOUNTER — Encounter (HOSPITAL_COMMUNITY): Payer: Self-pay

## 2024-01-22 VITALS — BP 148/88 | HR 82 | Wt >= 6400 oz

## 2024-01-22 DIAGNOSIS — G4733 Obstructive sleep apnea (adult) (pediatric): Secondary | ICD-10-CM | POA: Insufficient documentation

## 2024-01-22 DIAGNOSIS — I480 Paroxysmal atrial fibrillation: Secondary | ICD-10-CM | POA: Diagnosis not present

## 2024-01-22 DIAGNOSIS — Z6841 Body Mass Index (BMI) 40.0 and over, adult: Secondary | ICD-10-CM | POA: Insufficient documentation

## 2024-01-22 DIAGNOSIS — Z7901 Long term (current) use of anticoagulants: Secondary | ICD-10-CM | POA: Diagnosis not present

## 2024-01-22 DIAGNOSIS — J45909 Unspecified asthma, uncomplicated: Secondary | ICD-10-CM | POA: Diagnosis not present

## 2024-01-22 DIAGNOSIS — I10 Essential (primary) hypertension: Secondary | ICD-10-CM | POA: Diagnosis not present

## 2024-01-22 DIAGNOSIS — I110 Hypertensive heart disease with heart failure: Secondary | ICD-10-CM | POA: Insufficient documentation

## 2024-01-22 DIAGNOSIS — I5033 Acute on chronic diastolic (congestive) heart failure: Secondary | ICD-10-CM | POA: Diagnosis not present

## 2024-01-22 DIAGNOSIS — E119 Type 2 diabetes mellitus without complications: Secondary | ICD-10-CM | POA: Diagnosis not present

## 2024-01-22 DIAGNOSIS — Z903 Acquired absence of stomach [part of]: Secondary | ICD-10-CM | POA: Diagnosis not present

## 2024-01-22 DIAGNOSIS — I5032 Chronic diastolic (congestive) heart failure: Secondary | ICD-10-CM | POA: Diagnosis present

## 2024-01-22 LAB — BRAIN NATRIURETIC PEPTIDE: B Natriuretic Peptide: 43.8 pg/mL (ref 0.0–100.0)

## 2024-01-22 LAB — BASIC METABOLIC PANEL WITH GFR
Anion gap: 10 (ref 5–15)
BUN: 8 mg/dL (ref 6–20)
CO2: 23 mmol/L (ref 22–32)
Calcium: 9 mg/dL (ref 8.9–10.3)
Chloride: 104 mmol/L (ref 98–111)
Creatinine, Ser: 0.71 mg/dL (ref 0.44–1.00)
GFR, Estimated: >60 mL/min (ref >60)
Glucose, Bld: 141 mg/dL — ABNORMAL HIGH (ref 70–99)
Potassium: 3.6 mmol/L (ref 3.5–5.1)
Sodium: 137 mmol/L (ref 135–145)

## 2024-01-22 MED ORDER — FUROSCIX 80 MG/10ML ~~LOC~~ CTKT
80.0000 mg | CARTRIDGE | Freq: Every day | SUBCUTANEOUS | 1 refills | Status: DC
  Filled 2024-01-22: qty 3, 1d supply, fill #0

## 2024-01-22 NOTE — Progress Notes (Signed)
 Specialty Pharmacy Initial Fill Coordination Note  Alyssa Blanchard is a 53 y.o. female contacted today regarding initial fill of specialty medication(s) Furosemide  (Furoscix )   Patient requested [name] at [facility] at [place] date: 01/22/24   Medication will be filled on: 01/22/24    Patient is aware of $4 copayment.   Disenrolling as this is a one time fill.

## 2024-01-22 NOTE — Progress Notes (Signed)
 ADVANCED HEART FAILURE FOLLOW UP CLINIC NOTE  Primary Care: [name], MD EP: Dr. Nancey Primary Cardiologist: Dr. Raford HF Cardiologist: Dr. Zenaida  HPI: Alyssa Blanchard is a 53 y.o. female with a PMH of HFpEF, untreated OSA/OHS, asthma, PAF, morbid obesity w/ prior laparoscopic sleeve gastrectomy in 2016, DM II, and HTN.      Admitted to [facility] in 1/25 with acute on chronic HFpEF and RLE cellulitis.   She was readmitted to [facility] in 2/25 with volume overload, Afib with RVR. Echo with EF 70-75%, RV okay, RVSP 39 mmHg. Diuresed with IV lasix . Later underwent TEE/DCCV to SR. She was also treated for norovirus, RLE cellulitis, E. Coli UTI then candida vaginosis. Discharged to CIR, weight 392 lbs.   She saw [facility] AHF/transplant team for consult 05/29/23.   Has had difficulty with volume overload in clinic at multiple visits, as well as Afib with RVR. Last visit was taking bumex  3mg  daily, weight down after she had started Zepbound.   Admitted 10/25 with AF with RVR. Seen by EP and started Tikosyn , converted to NSR. Dose reduced to 250 mg bid due to prolonged QTc. Metolazone  changed to PRN. Discharged home, weight 373 lbs.     SUBJECTIVE:  Today she returns for HF follow up. Overall feeling terrible. Breathing is worse, dyspnea with ADLs. Not urinating much. Rare palpitations. She has dizziness if she moves too fast. Denies abnormal bleeding, CP, or PND/Orthopnea. Appetite ok. Weight at home 402 pounds. Taking all medications. Took Furoscix  last week, has not had metolazone  since before admission.  PMH, current medications, allergies, social history, and family history reviewed in epic.  Wt Readings from Last 3 Encounters:  01/22/24 (!) 182.3 kg (401 lb 12.8 oz)  12/25/23 (!) 180 kg (396 lb 12.8 oz)  12/18/23 (!) 169.5 kg (373 lb 9.6 oz)   BP (!) 148/88   Pulse 82   Wt (!) 182.3 kg (401 lb 12.8 oz)   LMP 12/20/2017   SpO2 97%   BMI 66.86 kg/m   PHYSICAL  EXAM: General:  NAD. Walked into clinic with cane, mild conversational dyspnea HEENT: Normal Neck: Supple. Thick neck, but JVP appears 10-12 Cor: Regular rate & rhythm. No rubs, gallops or murmurs. Lungs: Clear Abdomen: +distended, morbidly obese Extremities: No cyanosis, clubbing, rash, trace BLE edema Neuro: Alert & oriented x 3, moves all 4 extremities w/o difficulty. Affect pleasant.  DATA REVIEW  ECG: 06/01/2023: Normal sinus rhythm 12/25/23 (personally reviewed from AF clinic): NSR, QTC 460 msec   ECHO: 04/24/2023: LVEF 70 to 75%, normal E prime, normal RV systolic function, mildly elevated RVSP, no gross valvular abnormalities   CATH: R/LHC at [facility] 9/23: RA mean 24, PA mean 49, PCWP mean 34, PVR 1.3 WU, Fick CI 2.01, TD CI 4.07, LVEDP 32, no CAD     ASSESSMENT & PLAN:  Acute Chronic diastolic heart failure: Multiple readmissions, volume status difficult given body habitus.  - NYHA IIIb-early IV. Body habitus contributing. Weight up 25 lbs since discharge. - Use Furoscix  + 40 KCL daily x 3 days (hold Bumex  while using Furoscix ) - AFter 3 days, resume home dose of Bumex  3 mg daily (orthostatic on 3 mg bid) - Avoid metolazone  with Tikosyn  - Can use PRN Furoscix  going forward - Continue spironolactone  50 mg daily. - Continue Toprol  XL 50 mg daily. - Continue losartan  25 mg daily; plan switch to Entresto next visit. - Not a Zoll HFMS nor Cardiomems candidate given insurance - Labs today,  repeat at follow up next week   Morbid obesity:  - Previous gastric sleeve. - Body mass index is 66.86 kg/m. - Now on GLP-1, unfortunately her weight is back up   PAF:  - Previously on tikosyn , s/p DCCV 2/25.  - 2 day holter monitor (3/25, [facility]):  mostly NSR, frequent short runs of SVT, rare PVCs and PACs - Now established with EP and AF clinic - Regular on exam today - Continue Tikosyn  250 mcg bid, per EP - Follows with afib clinic/EP - Continue Eliquis  5 mg bid, no bleeding  issues - Continue beta blocker - would AVOID metolazone  with Tikosyn , discussed with patient today. - Follow lytes closely with need for increased diuretics    HTN:  - BP elevated today - Plan eventual switch to Entresto   OSA:  - Sleep study at [facility] in 05/25 with moderate OSA - Not on CPAP  Follow up next week with APP for volume check. She is high risk for readmission.  Harlene Gainer, FNP-BC 01/22/24

## 2024-01-22 NOTE — Patient Instructions (Addendum)
 Good to see you today  STOP Metolazone   Labs done today, your results will be available in MyChart, we will contact you for abnormal readings.   Your provider has order Furoscix  for you. This is an on-body infuser that gives you a dose of Furosemide .   It will be shipped to your home from [facility], they will call you before shipping  Ensure you write down the time you start your infusion so that if there is a problem you will know how long the infusion lasted  Use Furoscix  only AS DIRECTED by our office  Dosing Directions:   Day 1= today  furoscix  kit with 40 meq potassium  Day 2= Saturday furoscix  kit with 40 meq of potassium   Day 3= Sunday furoscix  kit with 40 meq of potassium  HOLD Bumex  during furoscix  infusions  Monday start taking bumex    Your physician recommends that you schedule a follow-up appointment as scheduled  If you have any questions or concerns before your next appointment please send us  a message through [place] or call our office at [phone].    TO LEAVE A MESSAGE FOR THE NURSE SELECT OPTION 2, PLEASE LEAVE A MESSAGE INCLUDING: YOUR NAME DATE OF BIRTH CALL BACK NUMBER REASON FOR CALL**this is important as we prioritize the call backs  YOU WILL RECEIVE A CALL BACK THE SAME DAY AS LONG AS YOU CALL BEFORE 4:00 PM At the Advanced [facility] Failure Clinic, you and your health needs are our priority. As part of our continuing mission to provide you with exceptional heart care, we have created designated Provider Care Teams. These Care Teams include your primary Cardiologist (physician) and Advanced Practice Providers (APPs- Physician Assistants and Nurse Practitioners) who all work together to provide you with the care you need, when you need it.   You may see any of the following providers on your designated Care Team at your next follow up: Dr Toribio Fuel Dr Ezra Shuck Dr. Morene Brownie Greig Mosses, NP Caffie Shed,  [place] [facility], [place] Beckey Coe, NP Jordan Lee, NP Ellouise Class, NP Tinnie Redman, PharmD Jaun Bash, PharmD   Please be sure to bring in all your medications bottles to every appointment.    Thank you for choosing [facility]

## 2024-01-22 NOTE — Progress Notes (Signed)
 Medication Samples have been provided to the patient.  Drug name: Furoscix        Strength: 80 mg        Qty: 2  LOT: [id number]  Exp.Date: 02/06/2025  Dosing instructions: Take as directed by Heart failure clinic  The patient has been instructed regarding the correct time, dose, and frequency of taking this medication, including desired effects and most common side effects.   [name] 12:03 PM 01/22/2024

## 2024-01-24 ENCOUNTER — Encounter (HOSPITAL_COMMUNITY): Payer: Self-pay

## 2024-01-24 ENCOUNTER — Emergency Department (HOSPITAL_COMMUNITY)

## 2024-01-24 ENCOUNTER — Emergency Department (HOSPITAL_COMMUNITY)
Admission: EM | Admit: 2024-01-24 | Discharge: 2024-01-25 | Discharge status: Against Medical Advice | Attending: Emergency Medicine | Admitting: Emergency Medicine

## 2024-01-24 ENCOUNTER — Other Ambulatory Visit: Payer: Self-pay

## 2024-01-24 DIAGNOSIS — I509 Heart failure, unspecified: Secondary | ICD-10-CM | POA: Insufficient documentation

## 2024-01-24 DIAGNOSIS — R197 Diarrhea, unspecified: Secondary | ICD-10-CM | POA: Diagnosis not present

## 2024-01-24 DIAGNOSIS — R112 Nausea with vomiting, unspecified: Secondary | ICD-10-CM | POA: Insufficient documentation

## 2024-01-24 DIAGNOSIS — Z5321 Procedure and treatment not carried out due to patient leaving prior to being seen by health care provider: Secondary | ICD-10-CM | POA: Insufficient documentation

## 2024-01-24 DIAGNOSIS — R0602 Shortness of breath: Secondary | ICD-10-CM | POA: Insufficient documentation

## 2024-01-24 DIAGNOSIS — R509 Fever, unspecified: Secondary | ICD-10-CM | POA: Insufficient documentation

## 2024-01-24 LAB — BASIC METABOLIC PANEL WITH GFR
Anion gap: 11 (ref 5–15)
BUN: 6 mg/dL (ref 6–20)
CO2: 22 mmol/L (ref 22–32)
Calcium: 8.6 mg/dL — ABNORMAL LOW (ref 8.9–10.3)
Chloride: 101 mmol/L (ref 98–111)
Creatinine, Ser: 0.77 mg/dL (ref 0.44–1.00)
GFR, Estimated: >60 mL/min
Glucose, Bld: 141 mg/dL — ABNORMAL HIGH (ref 70–99)
Potassium: 3.6 mmol/L (ref 3.5–5.1)
Sodium: 134 mmol/L — ABNORMAL LOW (ref 135–145)

## 2024-01-24 LAB — BRAIN NATRIURETIC PEPTIDE: B Natriuretic Peptide: 77.6 pg/mL (ref 0.0–100.0)

## 2024-01-24 LAB — CBC
HCT: 36.2 % (ref 36.0–46.0)
Hemoglobin: 10.7 g/dL — ABNORMAL LOW (ref 12.0–15.0)
MCH: 25.3 pg — ABNORMAL LOW (ref 26.0–34.0)
MCHC: 29.6 g/dL — ABNORMAL LOW (ref 30.0–36.0)
MCV: 85.6 fL (ref 80.0–100.0)
Platelets: 149 K/uL — ABNORMAL LOW (ref 150–400)
RBC: 4.23 MIL/uL (ref 3.87–5.11)
RDW: 17.4 % — ABNORMAL HIGH (ref 11.5–15.5)
WBC: 8.8 K/uL (ref 4.0–10.5)
nRBC: 0 % (ref 0.0–0.2)

## 2024-01-24 LAB — TROPONIN I (HIGH SENSITIVITY): Troponin I (High Sensitivity): 7 ng/L (ref <18)

## 2024-01-24 NOTE — ED Provider Triage Note (Signed)
 Emergency Medicine Provider Triage Evaluation Note  Alyssa Blanchard , a 53 y.o. female  was evaluated in triage.  Pt complains of feeling fluid overloaded. Reports nausea/vomiting over the past few days so not keeping her meds down.  Was just put on lasix  in addition to her bumex  and spironolactone .  She has not been holding anything down.  Feels like fluid is accumulating in her abdomen.  Also reports fever yesterday to 101F but afebrile here.  Review of Systems  Positive: SOB, abdominal swelling, fever Negative: diarrhea  Physical Exam  BP (!) 146/75 (BP Location: Right Wrist)   Pulse 84   Temp 98.3 F (36.8 C) (Oral)   Resp (!) 22   Ht 5' 5 (1.651 m)   Wt (!) 181.4 kg   LMP 12/20/2017   SpO2 96%   BMI 66.56 kg/m  Gen:   Awake, no distress   Resp:  Normal effort  MSK:   Moves extremities without difficulty  Other:    Medical Decision Making  Medically screening exam initiated at 10:52 PM.  Appropriate orders placed.  Alyssa Blanchard was informed that the remainder of the evaluation will be completed by another provider, this initial triage assessment does not replace that evaluation, and the importance of remaining in the ED until their evaluation is complete.  SOB, feeling fluid overloaded.  EKG, labs, CXR sent.   Jarold Olam HERO, PA-C 01/24/24 [id number]

## 2024-01-24 NOTE — ED Triage Notes (Addendum)
 Pt to ED via [facility] from home c/o [facility] X 2.[redacted] weeks along with N/V/D. Pt has been of lasix  for a couple of days. Reports SHOB worse with exhaustion. Pt denies CP, NSR, No medications given by EMS.   Hx CHF, AFIB.   Last VS: 138/56, hr 90 , 95%RA, CBG 148.

## 2024-01-25 ENCOUNTER — Ambulatory Visit (HOSPITAL_COMMUNITY)
Admission: RE | Admit: 2024-01-25 | Discharge: 2024-01-25 | Disposition: A | Discharge status: Home / Self Care | Source: Ambulatory Visit | Attending: Internal Medicine | Admitting: Internal Medicine

## 2024-01-25 ENCOUNTER — Encounter (HOSPITAL_COMMUNITY): Payer: Self-pay | Admitting: Internal Medicine

## 2024-01-25 VITALS — BP 128/82 | HR 80 | Ht 65.0 in | Wt 397.6 lb

## 2024-01-25 DIAGNOSIS — Z5181 Encounter for therapeutic drug level monitoring: Secondary | ICD-10-CM | POA: Diagnosis not present

## 2024-01-25 DIAGNOSIS — Z79899 Other long term (current) drug therapy: Secondary | ICD-10-CM | POA: Insufficient documentation

## 2024-01-25 DIAGNOSIS — R112 Nausea with vomiting, unspecified: Secondary | ICD-10-CM | POA: Diagnosis present

## 2024-01-25 DIAGNOSIS — D6869 Other thrombophilia: Secondary | ICD-10-CM | POA: Insufficient documentation

## 2024-01-25 DIAGNOSIS — I480 Paroxysmal atrial fibrillation: Secondary | ICD-10-CM | POA: Insufficient documentation

## 2024-01-25 LAB — TROPONIN I (HIGH SENSITIVITY): Troponin I (High Sensitivity): 5 ng/L (ref <18)

## 2024-01-25 NOTE — ED Notes (Signed)
 Pt leaving AMA due to wait

## 2024-01-25 NOTE — Progress Notes (Signed)
 Primary Care Physician: Nikki Rams, Aliene, MD Primary Cardiologist: Annabella Scarce, MD Electrophysiologist: Eulas FORBES Furbish, MD     Referring Physician: Vannie Reche RAMAN, NP     Alyssa Blanchard is a 53 y.o. female with a history of obesity s/p gastric bypass, CHF, OSA, T2DM, pulmonary hypertension, and atrial fibrillation who presents for consultation in the [facility]. Seen by Cardiology on 9/23 and patient has noted increased episodes of Afib. History of Tikosyn  ([facility]). Patient is on Eliquis  for a CHADS2VASC score of 3. She was seen by me on 12/07/23 and preparing her for Tikosyn  admission due to flecainide  failure. She then presented to ED on 10/4 due to Afib and subsequently admitted for Tikosyn  load. QT prolongation required dose reduction to 250 mcg BID. Discharged on 10/10 on Tikosyn  250 mcg BID.   On follow up 01/25/24, patient is here for Tikosyn  surveillance.  She has had overall low A-fib burden since last visit.  No missed doses of Tikosyn .  No bleeding issues on Eliquis . She notes acute N/V/D that has been ongoing since 11/14. Patient went to ED last night for SOB but left AMA. BNP normal. I can see patient has lost weight compared to visit on 11/14; she has not taken Furoscix  due to ongoing fluid losses.   Today, she denies symptoms of chest pain, orthopnea, PND, dizziness, presyncope, syncope, bleeding, or neurologic sequela. The patient is tolerating medications without difficulties and is otherwise without complaint today.    Atrial Fibrillation Risk Factors:  she does have symptoms or diagnosis of sleep apnea. she is compliant with CPAP therapy.   she has a BMI of Body mass index is 66.16 kg/m.SABRA Filed Weights   01/25/24 0953  Weight: (!) 180.4 kg    Current Outpatient Medications  Medication Sig Dispense Refill   acetaminophen  (TYLENOL ) 325 MG tablet Take 2 tablets (650 mg total) by mouth every 6 (six) hours as needed for  mild pain (pain score 1-3) (or Fever >/= 101).     albuterol  (VENTOLIN  HFA) 108 (90 Base) MCG/ACT inhaler Inhale 1 puff into the lungs every 6 (six) hours as needed for wheezing or shortness of breath.     apixaban  (ELIQUIS ) 5 MG TABS tablet Take 1 tablet (5 mg total) by mouth 2 (two) times daily. 60 tablet 0   budesonide -glycopyrrolate -formoterol  (BREZTRI AEROSPHERE) 160-9-4.8 MCG/ACT AERO inhaler Inhale 2 puffs into the lungs 2 (two) times daily.     bumetanide  (BUMEX ) 1 MG tablet Take 3 tablets (3 mg total) by mouth daily. 90 tablet 6   cetirizine  (ZYRTEC ) 10 MG tablet Take 1 tablet (10 mg total) by mouth daily. 30 tablet 0   clindamycin (CLEOCIN T) 1 % lotion Apply topically as needed.     diclofenac  Sodium (VOLTAREN  ARTHRITIS PAIN) 1 % GEL Apply 2 g topically 2 (two) times daily. 50 g 0   docusate sodium  (COLACE) 100 MG capsule Take 200 mg by mouth as needed for mild constipation.     dofetilide  (TIKOSYN ) 250 MCG capsule Take 1 capsule (250 mcg total) by mouth 2 (two) times daily. 60 capsule 6   DULERA  200-5 MCG/ACT AERO Inhale 2 puffs into the lungs 2 (two) times daily.     Dupilumab  (DUPIXENT ) 300 MG/2ML SOAJ Inject 300 mg into the skin every 14 (fourteen) days.     Furosemide  (FUROSCIX ) 80 MG/10ML CTKT Inject 80 mg into the skin daily. 3 each 1   Lifitegrast  (XIIDRA ) 5 % SOLN Place 1  drop into both eyes in the morning and at bedtime.     losartan  (COZAAR ) 25 MG tablet Take 1 tablet (25 mg total) by mouth daily. 90 tablet 3   magnesium  oxide (MAG-OX) 400 MG tablet Take 1 tablet (400 mg total) by mouth daily. 30 tablet 0   melatonin 5 MG TABS Take 2 tablets (10 mg total) by mouth at bedtime. 60 tablet 0   metoprolol  succinate (TOPROL -XL) 25 MG 24 hr tablet Take 2 tablets (50 mg total) by mouth daily. 180 tablet 3   Multiple Vitamins-Minerals (BARIATRIC MULTIVITAMINS/IRON  PO) Take 1 tablet by mouth daily with breakfast.     omeprazole (PRILOSEC) 20 MG capsule Take 20 mg by mouth 2 (two) times  daily before a meal.     Pantoprazole  Sodium (PROTONIX  PO) Take 2 mg by mouth 2 (two) times daily.     polyethylene glycol (MIRALAX  / GLYCOLAX ) 17 g packet Take 17 g by mouth as needed.     potassium chloride  SA (KLOR-CON  M) 20 MEQ tablet Take 3 tablets (60 mEq total) by mouth 3 (three) times daily. Take an addition 2 tablet (40 meq) when you take metolazone  300 tablet 6   pregabalin  (LYRICA ) 300 MG capsule Take 300 mg by mouth 2 (two) times daily.     silver  sulfADIAZINE  (SILVADENE ) 1 % cream Apply topically daily. Apply to right side 50 g 0   spironolactone  (ALDACTONE ) 25 MG tablet Take 2 tablets (50 mg total) by mouth daily. 90 tablet 3   tirzepatide (ZEPBOUND) 10 MG/0.5ML Pen Inject 10 mg into the skin once a week. Thursday     No current facility-administered medications for this encounter.   Facility-Administered Medications Ordered in Other Encounters  Medication Dose Route Frequency Provider Last Rate Last Admin   acetaminophen  (TYLENOL ) tablet 650 mg  650 mg Oral Once Thayil, Irene T, PA-C       ferric derisomaltose  (MONOFERRIC ) 1,000 mg in sodium chloride  0.9 % 100 mL infusion  1,000 mg Intravenous Once Thayil, Irene T, PA-C        Atrial Fibrillation Management history:  Previous antiarrhythmic drugs: flecainide  pill in pocket, tikosyn   Previous cardioversions: 04/28/23 Previous ablations: none Anticoagulation history: Eliquis    ROS- All systems are reviewed and negative except as per the HPI above.  Physical Exam: BP 128/82   Pulse 80   Ht 5' 5 (1.651 m)   Wt (!) 180.4 kg   LMP 12/20/2017   BMI 66.16 kg/m   GEN- The patient is well appearing, alert and oriented x 3 today.   Neck - no JVD or carotid bruit noted Lungs- Clear to ausculation bilaterally, normal work of breathing Heart- Regular rate and rhythm, no murmurs, rubs or gallops, PMI not laterally displaced Extremities- no clubbing, cyanosis, or edema Skin - no rash or ecchymosis noted   EKG today  demonstrates  Vent. rate 80 BPM PR interval 154 ms QRS duration 82 ms QT/QTcB 418/482 ms P-R-T axes 46 6 28 Normal sinus rhythm with sinus arrhythmia Cannot rule out Anterior infarct (cited on or before 25-Dec-2023) Abnormal ECG When compared with ECG of 24-Jan-2024 21:52, No significant change was found  Echo 04/24/23 demonstrated  1. Left ventricular ejection fraction, by estimation, is 70 to 75%. The  left ventricle has hyperdynamic function. The left ventricle has no  regional wall motion abnormalities. There is mild left ventricular  hypertrophy. Left ventricular diastolic  parameters were normal (medial e' 9.7 cm/s).   2. Right ventricular systolic function is  normal. The right ventricular  size is normal. There is mildly elevated pulmonary artery systolic  pressure. The estimated right ventricular systolic pressure is 38.5 mmHg.   3. The mitral valve is grossly normal. Trivial mitral valve  regurgitation. No evidence of mitral stenosis.   4. The aortic valve was not well visualized. There is mild calcification  of the aortic valve. Aortic valve regurgitation is not visualized. No  aortic stenosis is present.   5. The inferior vena cava is dilated in size with >50% respiratory  variability, suggesting right atrial pressure of 8 mmHg.   6. Increased flow velocities may be secondary to anemia, thyrotoxicosis,  hyperdynamic or high flow state.   ASSESSMENT & PLAN CHA2DS2-VASc Score = 4  The patient's score is based upon: CHF History: 1 HTN History: 1 Diabetes History: 1 Stroke History: 0 Vascular Disease History: 0 Age Score: 0 Gender Score: 1       ASSESSMENT AND PLAN: Paroxysmal Atrial Fibrillation (ICD10:  I48.0) The patient's CHA2DS2-VASc score is 4, indicating a 4.8% annual risk of stroke.   S/p Tikosyn  load 10/4-12/2023.  Patient is currently in NSR. Continue Toprol  50 mg daily.  Patient went to ED last night and left AMA due to the wait.  Her EKG yesterday  showed sinus rhythm, her chest x-ray was reassuring, and her BNP was normal.  Be met shows ongoing potassium level that is a little bit lower than we would prefer for Tikosyn .  However, patient is taking 180 mEq of potassium daily so I am hesitant to increase that.  Will continue to monitor potassium.  Regarding patient's desire to take something for nausea, I spoke with pharmacy and the only recommendation that would be safe given Tikosyn  prescription is Pepto-Bismol.  This was recommended to patient to take OTC.  High risk medication monitoring (ICD10: [id number]) Patient requires ongoing monitoring for anti-arrhythmic medication which has the potential to cause life threatening arrhythmias or AV block. Qtc stable. Continue Tikosyn  250 mcg BID. Mag level drawn today.   Secondary Hypercoagulable State (ICD10:  D68.69) The patient is at significant risk for stroke/thromboembolism based upon her CHA2DS2-VASc Score of 4.  Continue Apixaban  (Eliquis ).  Continue OAC.   Chronic diastolic HF Patient is no longer taking metolazone  due to a class D interaction with Tikosyn  so appreciate heart failure for transitioning patient to Furoscix  as needed.  She is continuing to hold Furoscix  as with her current GI symptoms she is not requiring additional diuresis.  She has close follow-up with the heart failure clinic and will see them on 11/20.    Follow up 3 months for Tikosyn  surveillance.   Terra Pac, [facility]  Afib Clinic [address] [place], [place] [address] [phone]

## 2024-01-26 ENCOUNTER — Ambulatory Visit (HOSPITAL_COMMUNITY): Payer: Self-pay | Admitting: Internal Medicine

## 2024-01-26 LAB — MAGNESIUM: Magnesium: 2 mg/dL (ref 1.6–2.3)

## 2024-01-27 ENCOUNTER — Encounter (HOSPITAL_COMMUNITY): Payer: Self-pay

## 2024-01-27 ENCOUNTER — Other Ambulatory Visit (HOSPITAL_COMMUNITY): Payer: Self-pay

## 2024-01-27 ENCOUNTER — Telehealth (HOSPITAL_COMMUNITY): Payer: Self-pay | Admitting: Cardiology

## 2024-01-27 NOTE — Telephone Encounter (Signed)
 Patient called to report sever N/V/D since Friday 11/14  Reports she only used 1/3 of furoscix  that was ordered Reports weight 398 today Reports increase in SOB Reports fever Reports she has only been able to take Pepto   Would like to update provider with upcoming appt 11/21-provide input if needed  Please advise

## 2024-01-28 ENCOUNTER — Encounter (HOSPITAL_COMMUNITY): Payer: Self-pay

## 2024-01-28 ENCOUNTER — Ambulatory Visit (HOSPITAL_COMMUNITY): Payer: Self-pay | Admitting: Cardiology

## 2024-01-28 ENCOUNTER — Ambulatory Visit (HOSPITAL_COMMUNITY)
Admission: RE | Admit: 2024-01-28 | Discharge: 2024-01-28 | Disposition: A | Discharge status: Home / Self Care | Source: Ambulatory Visit | Attending: Cardiology | Admitting: Cardiology

## 2024-01-28 VITALS — BP 132/64 | HR 89 | Ht 65.0 in | Wt >= 6400 oz

## 2024-01-28 DIAGNOSIS — I491 Atrial premature depolarization: Secondary | ICD-10-CM | POA: Diagnosis not present

## 2024-01-28 DIAGNOSIS — I110 Hypertensive heart disease with heart failure: Secondary | ICD-10-CM | POA: Diagnosis not present

## 2024-01-28 DIAGNOSIS — J45909 Unspecified asthma, uncomplicated: Secondary | ICD-10-CM | POA: Diagnosis not present

## 2024-01-28 DIAGNOSIS — Z79899 Other long term (current) drug therapy: Secondary | ICD-10-CM | POA: Insufficient documentation

## 2024-01-28 DIAGNOSIS — Z7985 Long-term (current) use of injectable non-insulin antidiabetic drugs: Secondary | ICD-10-CM | POA: Insufficient documentation

## 2024-01-28 DIAGNOSIS — Z9884 Bariatric surgery status: Secondary | ICD-10-CM | POA: Insufficient documentation

## 2024-01-28 DIAGNOSIS — E119 Type 2 diabetes mellitus without complications: Secondary | ICD-10-CM | POA: Diagnosis not present

## 2024-01-28 DIAGNOSIS — Z6841 Body Mass Index (BMI) 40.0 and over, adult: Secondary | ICD-10-CM | POA: Insufficient documentation

## 2024-01-28 DIAGNOSIS — I5032 Chronic diastolic (congestive) heart failure: Secondary | ICD-10-CM | POA: Insufficient documentation

## 2024-01-28 DIAGNOSIS — Z7901 Long term (current) use of anticoagulants: Secondary | ICD-10-CM | POA: Insufficient documentation

## 2024-01-28 DIAGNOSIS — I480 Paroxysmal atrial fibrillation: Secondary | ICD-10-CM | POA: Insufficient documentation

## 2024-01-28 DIAGNOSIS — G4733 Obstructive sleep apnea (adult) (pediatric): Secondary | ICD-10-CM | POA: Diagnosis not present

## 2024-01-28 LAB — MAGNESIUM: Magnesium: 2 mg/dL (ref 1.7–2.4)

## 2024-01-28 LAB — BASIC METABOLIC PANEL WITH GFR
Anion gap: 12 (ref 5–15)
BUN: <5 mg/dL — ABNORMAL LOW (ref 6–20)
CO2: 22 mmol/L (ref 22–32)
Calcium: 8.8 mg/dL — ABNORMAL LOW (ref 8.9–10.3)
Chloride: 100 mmol/L (ref 98–111)
Creatinine, Ser: 0.73 mg/dL (ref 0.44–1.00)
GFR, Estimated: >60 mL/min (ref >60)
Glucose, Bld: 104 mg/dL — ABNORMAL HIGH (ref 70–99)
Potassium: 3.4 mmol/L — ABNORMAL LOW (ref 3.5–5.1)
Sodium: 134 mmol/L — ABNORMAL LOW (ref 135–145)

## 2024-01-28 LAB — BRAIN NATRIURETIC PEPTIDE: B Natriuretic Peptide: 42.5 pg/mL (ref 0.0–100.0)

## 2024-01-28 NOTE — Telephone Encounter (Signed)
 Noted

## 2024-01-28 NOTE — Patient Instructions (Addendum)
 Medication Changes:  Your provider has order Furoscix  for you. This is an on-body infuser that gives you a dose of Furosemide .   It will be shipped to your home from [facility], they will call you before shipping  Ensure you write down the time you start your infusion so that if there is a problem you will know how long the infusion lasted  Use Furoscix  only AS DIRECTED by our office  Dosing Directions:    Day 1= today  furoscix  kit with 40 meq potassium   Day 2= Saturday furoscix  kit with 40 meq of potassium           Day 3= Sunday furoscix  kit with 40 meq of potassium   HOLD Bumex  during furoscix  infusions  Resume normal BUMEX  AFTER FUROSCIX  KITS   Lab Work:  Labs done today, your results will be available in MyChart, we will contact you for abnormal readings.  Follow-Up in: 2 weeks as scheduled with APP clinic  At the Advanced [facility] Clinic, you and your health needs are our priority. We have a designated team specialized in the treatment of Heart Failure. This Care Team includes your primary Heart Failure Specialized Cardiologist (physician), Advanced Practice Providers (APPs- Physician Assistants and Nurse Practitioners), and Pharmacist who all work together to provide you with the care you need, when you need it.   You may see any of the following providers on your designated Care Team at your next follow up:  Dr. Toribio Fuel Dr. Ezra Shuck Dr. Odis Brownie Greig Mosses, NP Caffie Shed, [place] [facility] [place], [place] Beckey Coe, NP Jordan Lee, NP Tinnie Redman, PharmD   Please be sure to bring in all your medications bottles to every appointment.   Need to Contact Us :  If you have any questions or concerns before your next appointment please send us  a message through [place] or call our office at [phone].    TO LEAVE A MESSAGE FOR THE NURSE SELECT OPTION 2, PLEASE LEAVE A MESSAGE INCLUDING: YOUR NAME DATE OF  BIRTH CALL BACK NUMBER REASON FOR CALL**this is important as we prioritize the call backs  YOU WILL RECEIVE A CALL BACK THE SAME DAY AS LONG AS YOU CALL BEFORE 4:00 PM

## 2024-01-28 NOTE — Progress Notes (Signed)
 ADVANCED HEART FAILURE CLINIC NOTE  Primary Care: Nikki Rams, Aliene, MD Primary Cardiologist: Dr. Zenaida  Reason for visit: f/u for heart failure   HPI: Alyssa Blanchard is a 53 y.o. female with a PMH of HFpEF, untreated OSA/OHS, asthma, PAF, morbid obesity w/ prior laparoscopic sleeve gastrectomy in 2016, DM II, and HTN.  Admitted to [facility] in 1/25 with acute on chronic HFpEF and RLE cellulitis.   She was readmitted to [facility] in 2/25 with volume overload, Afib with RVR. Echo with EF 70-75%, RV okay, RVSP 39 mmHg. Diuresed with IV lasix . Later underwent TEE/DCCV to SR. She was also treated for norovirus, RLE cellulitis, E. Coli UTI then candida vaginosis. Discharged to CIR, weight 392 lbs.   She saw [facility] AHF/transplant team for consult 05/29/23.   Admitted multiple times in 2025 for volume overload.  Follow up 07/27/23, had gained 24 lbs in 2 months. She was prescribed furoscix  and metolazone  x 3 days.   Seen in ED 08/18/23 for AF. Urgent follow up 08/19/23, Bumex  increased to 3 mg bid, continued metolazone  3x/week.  Follow up with EP 6/25, given flecainide  for PRN use for AF episodes. Not an ablation candidate at current weight.  Admitted 10/25 with AF with RVR. Seen by EP and started Tikosyn , converted to NSR. Dose reduced to 250 mg bid due to prolonged QTc. Metolazone  changed to PRN. Discharged home, weight 373 lbs.   Seen in clinic last wk, was feeling poorly and was volume overloaded, Wt was up 25 lb since discharge. She endorsed worsening NYHA Class IIIb-IV symptoms. She was instructed to take Furoscix  + 40 KCL daily x 3 days (hold Bumex  while using Furoscix ). After 3 days, was instructed to return to Bumex  3 mg daily. Spiro continued.   Today in f/u, unfortunately, she was unable to complete Furoscix  as recommended due to development of any acute GI illness w/ N/V/D. Poor PO intake w/ food over recent days but has been trying to drink plenty of water  to stay hydrated. Her GI issues  have resolved but her wt is still up on home scale and on our scale. Now up 30 lb from d/c wt. She continues to endorse NYHA Class III-IIIB symptoms.   BP is 132/64. EKG shows NSR w/ PACs, 89 bpm. QTc 438 ms.    PMH, current medications, allergies, social history, and family history reviewed in epic.  Wt Readings from Last 3 Encounters:  01/28/24 (!) 182.8 kg (403 lb)  01/25/24 (!) 180.4 kg (397 lb 9.6 oz)  01/24/24 (!) 181.4 kg (400 lb)   BP 132/64   Pulse 89   Ht 5' 5 (1.651 m)   Wt (!) 182.8 kg (403 lb)   LMP 12/20/2017   SpO2 97%   BMI 67.06 kg/m   PHYSICAL EXAM: Vitals:   01/28/24 1431  BP: 132/64  Pulse: 89  SpO2: 97%   GENERAL: super morbid obesity,  NAD Lungs- clear CARDIAC:  thick neck, JVD not well visualized          Normal rate with regular rhythm. No MRG. Obese legs w/o pitting edema  ABDOMEN: obese, soft, non-tender, non-distended.  EXTREMITIES: Warm and well perfused.  NEUROLOGIC: No obvious FND    DATA REVIEW  ECG: NSR w/ PACs 89 bpm, QRS 438 ms   ECHO: 04/24/2023: LVEF 70 to 75%, normal E prime, normal RV systolic function, mildly elevated RVSP, no gross valvular abnormalities  CATH: R/LHC at [facility] 9/23: RA mean 24, PA mean 49, PCWP  mean 34, PVR 1.3 WU, Fick CI 2.01, TD CI 4.07, LVEDP 32, no CAD    ASSESSMENT & PLAN:  1. Chronic diastolic heart failure: Multiple readmissions, volume status obviously difficult given body habitus. Suspect largely due to morbid obesity and untreated sleep apnea. Her wt is up 30 lb from recent d/c dry wt. NYHA III-IIIb - Use Furoscix  + 40 KCL daily x 3 days (hold Bumex  while using Furoscix ) - After 3 days, resume home dose of Bumex  3 mg daily (orthostatic on 3 mg bid) - Avoid metolazone  with Tikosyn  - Can use PRN Furoscix  going forward - Continue spironolactone  50 mg daily. - Continue Toprol  XL 50 mg daily. - Continue losartan  25 mg daily; plan switch to Entresto next visit. - Check BMP and Mg level today    2.  Morbid obesity: Previous gastric sleeve. - Body mass index is 67.06 kg/m. - Now on GLP-1  3. PAF: Previously on tikosyn , s/p DCCV 2/25.  - 2 day holter monitor (3/25, [facility]):  mostly NSR, frequent short runs of SVT, rare PVCs and PACs - Now established with [facility] EP, and plugged into AF clinic. Now on Tikosyn   - EKG today shows NSR w/ PACs, QTc ok - Continue Tikosyn  per EP  - Check K and Mg level  - Continue Eliquis  5 mg bid   4. HTN: controlled on current regimen - consider addition of ARNI next visit is SCr remains stable   5. OSA:  - Sleep study at [facility] in 05/25 with moderate OSA - Has CPAP, planning on starting soon  F/u in 2 wks to reassess volume status     Caffie Shed, PA-C  Advanced Heart Failure 01/28/24

## 2024-01-29 ENCOUNTER — Encounter (HOSPITAL_COMMUNITY)

## 2024-01-29 MED ORDER — POTASSIUM CHLORIDE CRYS ER 20 MEQ PO TBCR: EXTENDED_RELEASE_TABLET | ORAL | 0 refills | Status: DC

## 2024-02-01 ENCOUNTER — Ambulatory Visit (HOSPITAL_BASED_OUTPATIENT_CLINIC_OR_DEPARTMENT_OTHER): Admitting: Physical Therapy

## 2024-02-03 ENCOUNTER — Ambulatory Visit (HOSPITAL_COMMUNITY)

## 2024-02-10 ENCOUNTER — Telehealth (HOSPITAL_COMMUNITY): Payer: Self-pay

## 2024-02-10 NOTE — Telephone Encounter (Signed)
 Called to confirm/remind patient of their appointment at the Advanced Heart Failure Clinic on 02/11/24.   Appointment:   [x] Confirmed  [] Left mess   [] No answer/No voice mail  [] VM Full/unable to leave message  [] Phone not in service  Patient reminded to bring all medications and/or complete list.  Confirmed patient has transportation. Gave directions, instructed to utilize valet parking.

## 2024-02-11 ENCOUNTER — Ambulatory Visit (HOSPITAL_COMMUNITY)

## 2024-02-11 NOTE — Progress Notes (Incomplete)
 ADVANCED HEART FAILURE CLINIC NOTE  Primary Care: Nikki Rams, Aliene, MD Primary Cardiologist: Dr. Zenaida  Reason for visit: f/u for heart failure   HPI: Alyssa Blanchard is a 53 y.o. female with a PMH of HFpEF, untreated OSA/OHS, asthma, PAF, morbid obesity w/ prior laparoscopic sleeve gastrectomy in 2016, DM II, and HTN.  Admitted to [facility] in 1/25 with acute on chronic HFpEF and RLE cellulitis.   She was readmitted to [facility] in 2/25 with volume overload, Afib with RVR. Echo with EF 70-75%, RV okay, RVSP 39 mmHg. Diuresed with IV lasix . Later underwent TEE/DCCV to SR. She was also treated for norovirus, RLE cellulitis, E. Coli UTI then candida vaginosis. Discharged to CIR, weight 392 lbs.   She saw [facility] AHF/transplant team for consult 05/29/23.   Admitted multiple times in 2025 for volume overload.  Follow up 07/27/23, had gained 24 lbs in 2 months. She was prescribed furoscix  and metolazone  x 3 days.   Seen in ED 08/18/23 for AF. Urgent follow up 08/19/23, Bumex  increased to 3 mg bid, continued metolazone  3x/week.  Follow up with EP 6/25, given flecainide  for PRN use for AF episodes. Not an ablation candidate at current weight.  Admitted 10/25 with AF with RVR. Seen by EP and started Tikosyn , converted to NSR. Dose reduced to 250 mg bid due to prolonged QTc. Metolazone  changed to PRN. Discharged home, weight 373 lbs.   Recently struggled with recurrent volume overload. Has required fuorscix on multiple occasions. Last seen 01/28/24, had not been able to complete fuorscix at home d/t GI illness. Weight up 30 lb from discharge in October. Prescribed 3 days of furoscix .  Here today for close CHF follow-up.   PMH, current medications, allergies, social history, and family history reviewed in epic.  Wt Readings from Last 3 Encounters:  01/28/24 (!) 182.8 kg (403 lb)  01/25/24 (!) 180.4 kg (397 lb 9.6 oz)  01/24/24 (!) 181.4 kg (400 lb)   LMP 12/20/2017   PHYSICAL EXAM: There  were no vitals filed for this visit.  GENERAL: super morbid obesity,  NAD Lungs- clear CARDIAC:  thick neck, JVD not well visualized          Normal rate with regular rhythm. No MRG. Obese legs w/o pitting edema  ABDOMEN: obese, soft, non-tender, non-distended.  EXTREMITIES: Warm and well perfused.  NEUROLOGIC: No obvious FND    DATA REVIEW  ECG: NSR w/ PACs 89 bpm, QRS 438 ms   ECHO: 04/24/2023: LVEF 70 to 75%, normal E prime, normal RV systolic function, mildly elevated RVSP, no gross valvular abnormalities  CATH: [facility]LHC at [facility] 9/23: RA mean 24, PA mean 49, PCWP mean 34, PVR 1.3 WU, Fick CI 2.01, TD CI 4.07, LVEDP 32, no CAD    ASSESSMENT & PLAN:  1. Chronic diastolic heart failure: Multiple readmissions, volume status obviously difficult given body habitus. Suspect largely due to morbid obesity and untreated sleep apnea. Her wt is up 30 lb from recent d/c dry wt. NYHA III-IIIb - Use Furoscix  + 40 KCL daily x 3 days (hold Bumex  while using Furoscix ) - After 3 days, resume home dose of Bumex  3 mg daily (orthostatic on 3 mg bid) - Avoid metolazone  with Tikosyn  - Can use PRN Furoscix  going forward - Continue spironolactone  50 mg daily. - Continue Toprol  XL 50 mg daily. - Continue losartan  25 mg daily; plan switch to Entresto next visit. - Check BMP and Mg level today    2. Morbid obesity: Previous gastric  sleeve. - There is no height or weight on file to calculate BMI. - Now on GLP-1  3. PAF: Previously on tikosyn , s/p DCCV 2/25.  - 2 day holter monitor (3/25, [facility]):  mostly NSR, frequent short runs of SVT, rare PVCs and PACs - Now established with [facility] EP, and plugged into AF clinic. Now on Tikosyn   - EKG today shows NSR w/ PACs, QTc ok - Continue Tikosyn  per EP  - Check K and Mg level  - Continue Eliquis  5 mg bid  4. HTN: controlled on current regimen - consider addition of ARNI next visit is SCr remains stable   5. OSA:  - Sleep study at [facility] in 05/25 with  moderate OSA - Has CPAP, planning on starting soon  F/u     [facility], Travaughn Vue N, PA-C  Advanced Heart Failure 02/11/24

## 2024-02-14 NOTE — H&P (Signed)
 Date: December 08, 2023   Patient: Alyssa Blanchard  PID: [id number]  DOB: [birth year]  SEX: Female   Self Referral  CC: Painful teeth.   Past Medical History:  Congestive Heart Failure, Diabetes, Atrial Fibrillation, Heart murmur, High Blood Pressure, Chest Pain or Angina, Irregular Heart Beat, Asthma, Sleep Apnea, Cpap, Anemia, Bleeding Tendency, swollen leg, Acid Reflux, Morbid Obesity    Medications: pregabalin , Melatonin, Tylenol , Zyrtec , Flecainide , multi-vitamin, Metoprolol , Eliquis , Losartan , Omeprazole, Metolazone , Brextri, Bumetanide , Spironolactone , Potassium, Magnesium , Dupixent , Zepbound    Allergies:     Gabapentin, Topamax     Surgeries:   Cardioversion, Oral Surgery, Tonsillectomy, Gastric Sleeve, Heart Surgery     Social History       Smoking:            Alcohol: Drug use:                             Exam: BMI 65. Dental caries, retained roots teeth #'s 2, 3, 8, 9, 10, 11, 13, 14, 15, 22, 28. No purulence, edema, fluctuance, trismus. Oral cancer screening negative. Pharynx clear. No lymphadenopathy.  Panorex:Dental caries, retained roots teeth #'s 2, 3, 8, 9, 10, 11, 13, 14, 15, 22, 28.  Assessment: ASA 3. Non-restorable   teeth #'s 2, 3, 8, 9, 10, 11, 13, 14, 15, 22, 28.              Plan: 1. Cardiac clearance 2. Extraction Teeth #  2, 3, 8, 9, 10, 11, 13, 14, 15, 22, 28. A lveoloplasty.  Hospital Day surgery.                 Rx: n               Risks and complications explained. Questions answered.   Glendia EMERSON Primrose, DMD

## 2024-02-17 ENCOUNTER — Emergency Department (HOSPITAL_BASED_OUTPATIENT_CLINIC_OR_DEPARTMENT_OTHER)
Admission: EM | Admit: 2024-02-17 | Discharge: 2024-02-17 | Disposition: A | Discharge status: Home / Self Care | Attending: Emergency Medicine | Admitting: Emergency Medicine

## 2024-02-17 ENCOUNTER — Other Ambulatory Visit: Payer: Self-pay

## 2024-02-17 ENCOUNTER — Emergency Department (HOSPITAL_BASED_OUTPATIENT_CLINIC_OR_DEPARTMENT_OTHER): Admitting: Radiology

## 2024-02-17 ENCOUNTER — Encounter (HOSPITAL_BASED_OUTPATIENT_CLINIC_OR_DEPARTMENT_OTHER): Payer: Self-pay | Admitting: Emergency Medicine

## 2024-02-17 DIAGNOSIS — R051 Acute cough: Secondary | ICD-10-CM | POA: Diagnosis present

## 2024-02-17 DIAGNOSIS — D72829 Elevated white blood cell count, unspecified: Secondary | ICD-10-CM | POA: Diagnosis not present

## 2024-02-17 DIAGNOSIS — J329 Chronic sinusitis, unspecified: Secondary | ICD-10-CM | POA: Insufficient documentation

## 2024-02-17 DIAGNOSIS — Z91040 Latex allergy status: Secondary | ICD-10-CM | POA: Insufficient documentation

## 2024-02-17 DIAGNOSIS — J4531 Mild persistent asthma with (acute) exacerbation: Secondary | ICD-10-CM | POA: Insufficient documentation

## 2024-02-17 DIAGNOSIS — B9689 Other specified bacterial agents as the cause of diseases classified elsewhere: Secondary | ICD-10-CM | POA: Insufficient documentation

## 2024-02-17 DIAGNOSIS — I4891 Unspecified atrial fibrillation: Secondary | ICD-10-CM | POA: Diagnosis not present

## 2024-02-17 DIAGNOSIS — Z7901 Long term (current) use of anticoagulants: Secondary | ICD-10-CM | POA: Insufficient documentation

## 2024-02-17 DIAGNOSIS — J45909 Unspecified asthma, uncomplicated: Secondary | ICD-10-CM | POA: Diagnosis not present

## 2024-02-17 DIAGNOSIS — Z79899 Other long term (current) drug therapy: Secondary | ICD-10-CM | POA: Diagnosis not present

## 2024-02-17 DIAGNOSIS — I509 Heart failure, unspecified: Secondary | ICD-10-CM | POA: Insufficient documentation

## 2024-02-17 LAB — BASIC METABOLIC PANEL WITH GFR
Anion gap: 13 (ref 5–15)
BUN: <5 mg/dL — ABNORMAL LOW (ref 6–20)
CO2: 24 mmol/L (ref 22–32)
Calcium: 8.8 mg/dL — ABNORMAL LOW (ref 8.9–10.3)
Chloride: 98 mmol/L (ref 98–111)
Creatinine, Ser: 0.66 mg/dL (ref 0.44–1.00)
GFR, Estimated: >60 mL/min (ref >60)
Glucose, Bld: 165 mg/dL — ABNORMAL HIGH (ref 70–99)
Potassium: 3.6 mmol/L (ref 3.5–5.1)
Sodium: 136 mmol/L (ref 135–145)

## 2024-02-17 LAB — CBC WITH DIFFERENTIAL/PLATELET
Abs Immature Granulocytes: 0.06 K/uL (ref 0.00–0.07)
Basophils Absolute: 0.1 K/uL (ref 0.0–0.1)
Basophils Relative: 1 %
Eosinophils Absolute: 0.2 K/uL (ref 0.0–0.5)
Eosinophils Relative: 2 %
HCT: 31.5 % — ABNORMAL LOW (ref 36.0–46.0)
Hemoglobin: 9.7 g/dL — ABNORMAL LOW (ref 12.0–15.0)
Immature Granulocytes: 1 %
Lymphocytes Relative: 8 %
Lymphs Abs: 0.9 K/uL (ref 0.7–4.0)
MCH: 25.6 pg — ABNORMAL LOW (ref 26.0–34.0)
MCHC: 30.8 g/dL (ref 30.0–36.0)
MCV: 83.1 fL (ref 80.0–100.0)
Monocytes Absolute: 1 K/uL (ref 0.1–1.0)
Monocytes Relative: 10 %
Neutro Abs: 8.6 K/uL — ABNORMAL HIGH (ref 1.7–7.7)
Neutrophils Relative %: 78 %
Platelets: 159 K/uL (ref 150–400)
RBC: 3.79 MIL/uL — ABNORMAL LOW (ref 3.87–5.11)
RDW: 18.2 % — ABNORMAL HIGH (ref 11.5–15.5)
WBC: 10.8 K/uL — ABNORMAL HIGH (ref 4.0–10.5)
nRBC: 0 % (ref 0.0–0.2)

## 2024-02-17 LAB — RESP PANEL BY RT-PCR (RSV, FLU A&B, COVID)  RVPGX2
Influenza A by PCR: NEGATIVE
Influenza B by PCR: NEGATIVE
Resp Syncytial Virus by PCR: NEGATIVE
SARS Coronavirus 2 by RT PCR: NEGATIVE

## 2024-02-17 LAB — PRO BRAIN NATRIURETIC PEPTIDE: Pro Brain Natriuretic Peptide: 331 pg/mL — ABNORMAL HIGH (ref <300.0)

## 2024-02-17 MED ORDER — PREDNISONE 10 MG PO TABS: 40.0000 mg | ORAL_TABLET | Freq: Every day | ORAL | 0 refills | Status: AC

## 2024-02-17 MED ORDER — IPRATROPIUM-ALBUTEROL 0.5-2.5 (3) MG/3ML IN SOLN
3.0000 mL | Freq: Once | RESPIRATORY_TRACT | Status: AC
  Administered 2024-02-17: 3 mL via RESPIRATORY_TRACT
  Filled 2024-02-17: qty 3

## 2024-02-17 MED ORDER — AMOXICILLIN-POT CLAVULANATE 875-125 MG PO TABS
1.0000 | ORAL_TABLET | Freq: Once | ORAL | Status: AC
  Administered 2024-02-17: 1 via ORAL
  Filled 2024-02-17: qty 1

## 2024-02-17 MED ORDER — DEXAMETHASONE SOD PHOSPHATE PF 10 MG/ML IJ SOLN
10.0000 mg | Freq: Once | INTRAMUSCULAR | Status: AC
  Administered 2024-02-17: 10 mg via INTRAVENOUS

## 2024-02-17 MED ORDER — FLUCONAZOLE 200 MG PO TABS: 200.0000 mg | ORAL_TABLET | Freq: Once | ORAL | 0 refills | Status: AC

## 2024-02-17 MED ORDER — AMOXICILLIN-POT CLAVULANATE 875-125 MG PO TABS: 1.0000 | ORAL_TABLET | Freq: Two times a day (BID) | ORAL | 0 refills | Status: AC

## 2024-02-17 MED ORDER — BENZONATATE 100 MG PO CAPS
200.0000 mg | ORAL_CAPSULE | Freq: Once | ORAL | Status: AC
  Administered 2024-02-17: 200 mg via ORAL
  Filled 2024-02-17: qty 2

## 2024-02-17 MED ORDER — BENZONATATE 100 MG PO CAPS: 100.0000 mg | ORAL_CAPSULE | Freq: Three times a day (TID) | ORAL | 0 refills | Status: AC

## 2024-02-17 NOTE — Discharge Instructions (Addendum)
 You appear to have an upper respiratory infection (URI). An upper respiratory tract infection, or cold, is a viral infection of the air passages leading to the lungs. It should improve gradually after 5-7 days. You may have a lingering cough that lasts for 2- 4 weeks after the infection.  You may also have a bacterial sinus infection.  You have been prescribed an antibiotic called Augmentin  to help treat sinus infection.  Please take this twice daily for the next 10 days as prescribed.  You were given your first dose here today.  Take your next dose this evening.  Your illness is contagious and can be spread to others. It cannot be cured by antibiotics or other medicines. Take basic precautions such as washing your hands often, covering your mouth when you cough or sneeze, and avoiding public places where you could spread your illness to others.   Your flu, covid, and RSV test were negative today. Your chest x-ray did not show any obvious pneumonia  Home care instructions:  You can take Tylenol  as directed on the packaging for fever reduction and pain relief.    For cough: honey 1/2 to 1 teaspoon (you can dilute the honey in water  or another fluid).  You can also use guaifenesin  and dextromethorphan  for cough which are over-the-counter medications. You can use a humidifier for chest congestion and cough.  If you don't have a humidifier, you can sit in the bathroom with the hot shower running.     You have been prescribed a medication called Tessalon  Perles to help with cough. You may take this up to every 8 hours as needed for cough.   For sore throat: try warm salt water  gargles, cepacol lozenges, throat spray, warm tea or water  with lemon/honey, popsicles or ice, or OTC cold relief medicine for throat discomfort.    For congestion: Flonase  (Fluticasone ) 1-2 sprays in each nostril daily. This is an over the counter medication.    It is important to stay hydrated: drink plenty of fluids (water ,  gatorade/powerade/pedialyte, juices, or teas) to keep your throat moisturized and help further relieve irritation/discomfort.   Follow-up instructions: Please follow-up with your primary care provider for further evaluation of your symptoms if you are not feeling better within the next 5 days.   Return instructions:  Please return to the Emergency Department if you experience worsening symptoms.  RETURN IMMEDIATELY IF you develop shortness of breath, confusion or altered mental status, a new rash, become dizzy, faint, or poorly responsive, or are unable to be cared for at home. Please return if you have persistent vomiting and cannot keep down fluids or develop a fever that is not controlled by tylenol  or motrin.   Please return if you have any other emergent concerns.

## 2024-02-17 NOTE — ED Triage Notes (Signed)
 Pt bib wheelchair to triage, c/o shob for over a week, worsening last 2 days. With cough and congestion. Also reports sore throat, CP with coughing. Assessed in triage by RT, Bilateral exp wheezing

## 2024-02-17 NOTE — ED Notes (Addendum)
 Placed patient on 2 LPM Holly upon arrival to room 4. Her SPO2 was 96%, notable SHOB. Once oxygen was placed and treatment began from Nursing and Respiratory her breathing calmed down. Upon assessment breath sounds are expiratory wheezes, patient Stated she has had her home medications this a.m.for her asthma. She has an allergy  to albuterol  listed but can tolerate. She does experience an  increase in her HR.  RT to continue to monitor.

## 2024-02-17 NOTE — ED Provider Notes (Signed)
 [place] EMERGENCY DEPARTMENT AT [facility] Provider Note   CSN: [id number] Arrival date & time: 02/17/24  9158     Patient presents with: Shortness of Breath   Alyssa Blanchard is a 53 y.o. female with history of CHF, type 2 diabetes, atrial fibrillation on Eliquis , obesity, asthma, presents with concern for a productive cough that has been ongoing for over a week.  Reports she is coughing up greenish-yellow sputum.  She reports that this cough is making her feel short of breath.  She also reports nasal congestion and pressure behind her eyes.  She reports subjective fever and chills at home and has been taking Tylenol  for this.  She denies any associated chest pain.  Has been compliant with her Eliquis .    Shortness of Breath      Prior to Admission medications   Medication Sig Start Date End Date Taking? Authorizing Provider  amoxicillin -clavulanate (AUGMENTIN ) 875-125 MG tablet Take 1 tablet by mouth 2 (two) times daily for 10 days. 02/17/24 02/27/24 Yes Veta Palma, PA-C  benzonatate  (TESSALON ) 100 MG capsule Take 1 capsule (100 mg total) by mouth every 8 (eight) hours. 02/17/24  Yes Veta Palma, PA-C  fluconazole  (DIFLUCAN ) 200 MG tablet Take 1 tablet (200 mg total) by mouth once for 1 dose. If you develop symptoms of a yeast infection 02/17/24 02/17/24 Yes Veta Palma, PA-C  predniSONE  (DELTASONE ) 10 MG tablet Take 4 tablets (40 mg total) by mouth daily for 5 days. 02/18/24 02/23/24 Yes Veta Palma, PA-C  acetaminophen  (TYLENOL ) 325 MG tablet Take 2 tablets (650 mg total) by mouth every 6 (six) hours as needed for mild pain (pain score 1-3) (or Fever >/= 101). 05/10/23   Angiulli, Toribio PARAS, PA-C  albuterol  (VENTOLIN  HFA) 108 (90 Base) MCG/ACT inhaler Inhale 1 puff into the lungs every 6 (six) hours as needed for wheezing or shortness of breath.    [provider]  apixaban  (ELIQUIS ) 5 MG TABS tablet Take 1 tablet (5 mg total) by mouth 2  (two) times daily. 05/11/23 01/21/25  Angiulli, Toribio PARAS, PA-C  bumetanide  (BUMEX ) 1 MG tablet Take 3 tablets (3 mg total) by mouth daily. 11/16/23   Colletta Manuelita Garre, PA-C  cetirizine  (ZYRTEC ) 10 MG tablet Take 1 tablet (10 mg total) by mouth daily. 05/11/23   Angiulli, Daniel J, PA-C  clindamycin (CLEOCIN T) 1 % lotion Apply topically as needed.    [provider]  diclofenac  Sodium (VOLTAREN  ARTHRITIS PAIN) 1 % GEL Apply 2 g topically 2 (two) times daily. 05/11/23   Angiulli, Toribio PARAS, PA-C  docusate sodium  (COLACE) 100 MG capsule Take 200 mg by mouth as needed for mild constipation.    [provider]  dofetilide  (TIKOSYN ) 250 MCG capsule Take 1 capsule (250 mcg total) by mouth 2 (two) times daily. 12/18/23   Lesia Ozell Barter, PA-C  DULERA  200-5 MCG/ACT AERO Inhale 2 puffs into the lungs 2 (two) times daily. 01/08/24   [provider]  Dupilumab  (DUPIXENT ) 300 MG/2ML SOAJ Inject 300 mg into the skin every 14 (fourteen) days.    [provider]  Furosemide  (FUROSCIX ) 80 MG/10ML CTKT Inject 80 mg into the skin daily. 01/22/24   Milford, Harlene HERO, FNP  Lifitegrast  (XIIDRA ) 5 % SOLN Place 1 drop into both eyes in the morning and at bedtime.    [provider]  losartan  (COZAAR ) 25 MG tablet Take 1 tablet (25 mg total) by mouth daily. 12/01/23 02/29/24  Vannie Reche RAMAN, NP  magnesium  oxide (MAG-OX) 400 MG tablet Take 1 tablet (400 mg total) by mouth daily. 05/11/23   Angiulli, Toribio PARAS, PA-C  melatonin 5 MG TABS Take 2 tablets (10 mg total) by mouth at bedtime. 05/11/23   Angiulli, Toribio PARAS, PA-C  metoprolol  succinate (TOPROL -XL) 25 MG 24 hr tablet Take 2 tablets (50 mg total) by mouth daily. 06/16/23   Zenaida Morene PARAS, MD  Multiple Vitamins-Minerals (BARIATRIC MULTIVITAMINS/IRON  PO) Take 1 tablet by mouth daily with breakfast.    [provider]  Pantoprazole  Sodium (PROTONIX  PO) Take 2 mg by mouth 2 (two) times daily.    [provider]  polyethylene glycol (MIRALAX  / GLYCOLAX ) 17 g packet Take 17 g by mouth as needed.    [provider]  potassium chloride  SA (KLOR-CON  M) 20 MEQ tablet Take 4 tablets (80 mEq total) by mouth every morning AND 4 tablets (80 mEq total) daily with lunch AND 3 tablets (60 mEq total) every evening. Take an addition 2 tablet (40 meq) when you take metolazone . 01/29/24   Marcine Catalan M, PA-C  pregabalin  (LYRICA ) 200 MG capsule Take 200 mg by mouth 2 (two) times daily.    [provider]  silver  sulfADIAZINE  (SILVADENE ) 1 % cream Apply topically daily. Apply to right side 05/11/23   Angiulli, Toribio PARAS, PA-C  spironolactone  (ALDACTONE ) 25 MG tablet Take 2 tablets (50 mg total) by mouth daily. 06/16/23   Zenaida Morene PARAS, MD    Allergies: Gabapentin, Topiramate , Albuterol , Hydroxyzine , Imitrex  [sumatriptan ], Latex, Metformin, Montelukast , Other, Prozac [fluoxetine], Semaglutide , Trazodone  and nefazodone, Advair diskus [fluticasone -salmeterol], Copper-containing compounds, and Tape    Review of Systems  Respiratory:  Positive for shortness of breath.     Updated Vital Signs BP 139/67   Pulse 89   Temp 98.3 F (36.8 C) (Oral)   Resp (!) 24   Ht 5' 5 (1.651 m)   Wt (!) 181.4 kg   LMP 12/20/2017   SpO2 93%   BMI 66.56 kg/m   Physical Exam Vitals and nursing note reviewed.  Constitutional:      General: She is not in acute distress.    Appearance: She is well-developed. She is obese.  HENT:     Head: Normocephalic and atraumatic.     Right Ear: Tympanic membrane and ear canal normal.     Left Ear: Tympanic membrane and ear canal normal.     Nose: Congestion present.     Mouth/Throat:     Pharynx: No oropharyngeal exudate or posterior oropharyngeal erythema.  Eyes:     Conjunctiva/sclera: Conjunctivae normal.  Cardiovascular:     Rate and Rhythm: Normal rate and regular rhythm.     Heart sounds: No murmur heard. Pulmonary:     Effort: Pulmonary effort is  normal. No respiratory distress.     Comments: Bilateral expiratory wheezes Able to talk in full sentences on room air Wet sounding cough on exam Abdominal:     Palpations: Abdomen is soft.     Tenderness: There is no abdominal tenderness.  Musculoskeletal:        General: No swelling.     Cervical back: Neck supple. No rigidity.     Right lower leg: No edema.     Left lower leg: No edema.  Skin:    General: Skin is warm and dry.     Capillary Refill: Capillary refill takes less than 2 seconds.  Neurological:     Mental Status: She is alert.  Psychiatric:  Mood and Affect: Mood normal.     (all labs ordered are listed, but only abnormal results are displayed) Labs Reviewed  CBC WITH DIFFERENTIAL/PLATELET - Abnormal; Notable for the following components:      Result Value   WBC 10.8 (*)    RBC 3.79 (*)    Hemoglobin 9.7 (*)    HCT 31.5 (*)    MCH 25.6 (*)    RDW 18.2 (*)    Neutro Abs 8.6 (*)    All other components within normal limits  PRO BRAIN NATRIURETIC PEPTIDE - Abnormal; Notable for the following components:   Pro Brain Natriuretic Peptide 331.0 (*)    All other components within normal limits  BASIC METABOLIC PANEL WITH GFR - Abnormal; Notable for the following components:   Glucose, Bld 165 (*)    BUN <5 (*)    Calcium 8.8 (*)    All other components within normal limits  RESP PANEL BY RT-PCR (RSV, FLU A&B, COVID)  [id number]    EKG: None  Radiology: DG Chest 2 View Result Date: 02/17/2024 EXAM: 2 VIEW(S) XRAY OF THE CHEST 02/17/2024 10:01:00 AM COMPARISON: 01/24/2024 CLINICAL HISTORY: cough x1 week FINDINGS: LIMITATIONS/ARTIFACTS: Study is limited by large body habitus. LUNGS AND PLEURA: Low lung volume. No focal pulmonary opacity. No pleural effusion. No pneumothorax. HEART AND MEDIASTINUM: No acute abnormality of the cardiac and mediastinal silhouettes. BONES AND SOFT TISSUES: No acute osseous abnormality. IMPRESSION: 1. No acute process. 2. Low lung  volume, limited by large body habitus. Electronically signed by: Evalene Coho MD 02/17/2024 10:30 AM EST RP Workstation: [id number]     Procedures   Medications Ordered in the ED  ipratropium-albuterol  (DUONEB) 0.5-2.5 (3) MG/3ML nebulizer solution 3 mL (3 mLs Nebulization Given 02/17/24 0925)  dexamethasone  (DECADRON ) injection 10 mg (10 mg Intravenous Given 02/17/24 0940)  ipratropium-albuterol  (DUONEB) 0.5-2.5 (3) MG/3ML nebulizer solution 3 mL (3 mLs Nebulization Given 02/17/24 0922)  ipratropium-albuterol  (DUONEB) 0.5-2.5 (3) MG/3ML nebulizer solution 3 mL (3 mLs Nebulization Given 02/17/24 1117)  benzonatate  (TESSALON ) capsule 200 mg (200 mg Oral Given 02/17/24 1153)  amoxicillin -clavulanate (AUGMENTIN ) 875-125 MG per tablet 1 tablet (1 tablet Oral Given 02/17/24 1153)    Clinical Course as of 02/17/24 1307  Wed Feb 17, 2024  0958 QTc prolonged at 480 will hold on zofran  for nausea [AF]    Clinical Course User Index [AF] Veta Palma, PA-C                                 Medical Decision Making Amount and/or Complexity of Data Reviewed Labs: ordered. Radiology: ordered.  Risk Prescription drug management.     Differential diagnosis includes but is not limited to COVID, flu, RSV, viral URI, strep pharyngitis, viral pharyngitis, allergic rhinitis, pneumonia, bronchitis, asthma exacerbation, CHF exacerbation, acute bacterial rhinosinusitis   ED Course:  Upon initial evaluation, patient is well-appearing, no acute distress.  Normal vital signs.  Maintaining 96% oxygen saturation on room air.  She is reporting a cough and some shortness of breath.  She does have a wet sounding cough on exam and is coughing up sputum.  Upon initial evaluation, diffuse expiratory wheezing in the lobes of the lungs bilaterally.  She was given Decadron  and DuoNeb by inhaler due to concern for asthma exacerbation.  Labs Ordered: I Ordered, and personally interpreted labs.  The  pertinent results include:   COVID, flu, RSV negative CBC with leukocytosis of 10.8.  Hemoglobin 9.7, this is down from 10.7 taken 3 weeks ago. BMP with glucose at 165, calcium slightly low at 8.8.  No other abnormalities proBNP mildly elevated at 331  Imaging Studies ordered: I ordered imaging studies including chest x-ray I independently visualized the imaging with scope of interpretation limited to determining acute life threatening conditions related to emergency care. Imaging showed no acute abnormality, but difficult to appreciate full lung fields given patient's body habitus I agree with the radiologist interpretation   Cardiac Monitoring: / EKG: The patient was maintained on a cardiac monitor.  I personally viewed and interpreted the cardiac monitored which showed an underlying rhythm of: Normal sinus rhythm   Medications Given: DuoNeb x 3 Augmentin  Tessalon  Perles Decadron   Upon re-evaluation, patient reports she is feeling much better with the breathing treatments and Decadron  given.  Upon reauscultation, patient's wheezing has resolved.  She is still satting 99% on room air and able to talk in full sentences. This is consistent with mild asthma exacerbation. Has been able to ambulate to and from the restroom without difficulty. Her COVID, flu, RSV testing is negative.  Chest x-ray does not show any consolidations concerning for pneumonia.  However, on my review of patient's chest x-ray, it is difficult to fully appreciate the lung fields given patient's body habitus.  Given her ongoing cough for over a week, sinus pressure for over a week, would like to treat for possible bacterial rhinosinusitis with course of Augmentin .  This will also help with any possible pneumonia.  Very low concern for CHF exacerbation for her cough.  No signs of pulmonary edema on chest x-ray.  proBNP mildly elevated at 331, no significant elevations.  Given her other symptoms of congestion, sinus pressure,  suspect her symptoms are more likely due to viral URI versus pneumonia versus bacterial sinusitis.  Patient stable and appropriate for discharge home  Impression: Bacterial rhino sinusitis Asthma exacerbation  Disposition:  Discharged home with instructions to take 10 day course of Augmentin .  She states she frequently gets yeast infections with antibiotics, and I have sent Diflucan  to her pharmacy to use if she develops a yeast infection.  May use Tessalon  Perles as needed for cough.  Prednisone  as prescribed for her asthma exacerbation.  Continue home inhalers as prescribed. Over-the-counter medications as needed for symptom control.  Follow-up with PCP if symptoms not improving within the next 5 days. Return precautions given and patient verbalized understanding.   This chart was dictated using voice recognition software, Dragon. Despite the best efforts of this provider to proofread and correct errors, errors may still occur which can change documentation meaning.       Final diagnoses:  Bacterial sinusitis  Acute cough  Mild persistent asthma with exacerbation    ED Discharge Orders          Ordered    predniSONE  (DELTASONE ) 10 MG tablet  Daily        02/17/24 1248    amoxicillin -clavulanate (AUGMENTIN ) 875-125 MG tablet  2 times daily        02/17/24 1248    benzonatate  (TESSALON ) 100 MG capsule  Every 8 hours        02/17/24 1248    fluconazole  (DIFLUCAN ) 200 MG tablet   Once        02/17/24 1248               Dahmir Epperly, PA-C 02/17/24 1307    Tonia Chew, MD 02/17/24 1541

## 2024-02-18 ENCOUNTER — Ambulatory Visit (HOSPITAL_COMMUNITY)

## 2024-02-19 ENCOUNTER — Ambulatory Visit: Admit: 2024-02-19 | Admitting: Oral Surgery

## 2024-02-19 SURGERY — DENTAL RESTORATION/EXTRACTIONS
Anesthesia: General

## 2024-02-21 LAB — GENECONNECT MOLECULAR SCREEN: Genetic Analysis Overall Interpretation: NEGATIVE

## 2024-02-23 ENCOUNTER — Ambulatory Visit (HOSPITAL_COMMUNITY): Admitting: Internal Medicine

## 2024-03-07 ENCOUNTER — Encounter (HOSPITAL_COMMUNITY): Payer: Self-pay

## 2024-03-07 ENCOUNTER — Ambulatory Visit (HOSPITAL_COMMUNITY)

## 2024-03-07 NOTE — Progress Notes (Incomplete)
 "  ADVANCED HEART FAILURE CLINIC NOTE  Primary Care: Alyssa Hansel Atlas, MD Primary Cardiologist: Dr. Zenaida  Reason for visit: f/u for heart failure   HPI: Alyssa Blanchard is a 53 y.o. female with a PMH of HFpEF, untreated OSA/OHS, asthma, PAF, morbid obesity w/ prior laparoscopic sleeve gastrectomy in 2016, DM II, and HTN.  Admitted to [facility] in 1/25 with acute on chronic HFpEF and RLE cellulitis.   She was readmitted to [facility] in 2/25 with volume overload, Afib with RVR. Echo with EF 70-75%, RV okay, RVSP 39 mmHg. Diuresed with IV lasix . Later underwent TEE/DCCV to SR. She was also treated for norovirus, RLE cellulitis, E. Coli UTI then candida vaginosis. Discharged to CIR, weight 392 lbs.   She saw [facility] AHF/transplant team for consult 05/29/23.   Admitted multiple times in 2025 for volume overload.  Follow up 07/27/23, had gained 24 lbs in 2 months. She was prescribed furoscix  and metolazone  x 3 days.   Seen in ED 08/18/23 for AF. Urgent follow up 08/19/23, Bumex  increased to 3 mg bid, continued metolazone  3x/week.  Follow up with EP 6/25, given flecainide  for PRN use for AF episodes. Not an ablation candidate at current weight.  Admitted 10/25 with AF with RVR. Seen by EP and started Tikosyn , converted to NSR. Dose reduced to 250 mg bid due to prolonged QTc. Metolazone  changed to PRN. Discharged home, weight 373 lbs.   Recently struggled with recurrent volume overload. Has required fuorscix on multiple occasions. Last seen 01/28/24, had not been able to complete fuorscix at home d/t GI illness. Weight up 30 lb from discharge in October. Prescribed 3 days of furoscix .  Here today for close CHF follow-up.   PMH, current medications, allergies, social history, and family history reviewed in epic.  Wt Readings from Last 3 Encounters:  02/17/24 (!) 181.4 kg (400 lb)  01/28/24 (!) 182.8 kg (403 lb)  01/25/24 (!) 180.4 kg (397 lb 9.6 oz)   LMP 12/20/2017   PHYSICAL EXAM: There  were no vitals filed for this visit.  GENERAL: super morbid obesity,  NAD Lungs- clear CARDIAC:  thick neck, JVD not well visualized          Normal rate with regular rhythm. No MRG. Obese legs w/o pitting edema  ABDOMEN: obese, soft, non-tender, non-distended.  EXTREMITIES: Warm and well perfused.  NEUROLOGIC: No obvious FND    DATA REVIEW  ECG: NSR w/ PACs 89 bpm, QRS 438 ms   ECHO: 04/24/2023: LVEF 70 to 75%, normal E prime, normal RV systolic function, mildly elevated RVSP, no gross valvular abnormalities  CATH: R/LHC at [facility] 9/23: RA mean 24, PA mean 49, PCWP mean 34, PVR 1.3 WU, Fick CI 2.01, TD CI 4.07, LVEDP 32, no CAD    ASSESSMENT & PLAN:  1. Chronic diastolic heart failure: Multiple readmissions, volume status obviously difficult given body habitus. Suspect largely due to morbid obesity and untreated sleep apnea. Her wt is up 30 lb from recent d/c dry wt. NYHA III-IIIb - Use Furoscix  + 40 KCL daily x 3 days (hold Bumex  while using Furoscix ) - After 3 days, resume home dose of Bumex  3 mg daily (orthostatic on 3 mg bid) - Avoid metolazone  with Tikosyn  - Can use PRN Furoscix  going forward - Continue spironolactone  50 mg daily. - Continue Toprol  XL 50 mg daily. - Continue losartan  25 mg daily; plan switch to Entresto next visit. - Check BMP and Mg level today    2. Morbid obesity: Previous  gastric sleeve. - There is no height or weight on file to calculate BMI. - Now on GLP-1  3. PAF: Previously on tikosyn , s/p DCCV 2/25.  - 2 day holter monitor (3/25, [facility]):  mostly NSR, frequent short runs of SVT, rare PVCs and PACs - Now established with [facility] EP, and plugged into AF clinic. Now on Tikosyn   - EKG today shows NSR w/ PACs, QTc ok - Continue Tikosyn  per EP  - Check K and Mg level  - Continue Eliquis  5 mg bid  4. HTN: controlled on current regimen - consider addition of ARNI next visit is SCr remains stable   5. OSA:  - Sleep study at [facility] in 05/25 with  moderate OSA - Has CPAP, planning on starting soon  F/u     [employer], Alyssa Blanchard N, PA-C  Advanced Heart Failure 03/07/2024 "

## 2024-03-15 ENCOUNTER — Ambulatory Visit (HOSPITAL_BASED_OUTPATIENT_CLINIC_OR_DEPARTMENT_OTHER): Admitting: Family

## 2024-03-16 ENCOUNTER — Other Ambulatory Visit (HOSPITAL_COMMUNITY): Payer: Self-pay | Admitting: Cardiology

## 2024-03-21 ENCOUNTER — Other Ambulatory Visit (HOSPITAL_COMMUNITY): Payer: Self-pay | Admitting: Cardiology

## 2024-03-22 NOTE — Telephone Encounter (Signed)
 Telephone Triage Nurse Note Medication Refill: Dermatology Standing Order Protocol  Last Office Visit  09/2023  Next Office Visit 03/2024  Labs(if applicable) NA(not applicable)   Order Mode Per protocol: no cosign required  Provider Doyal Lorriane Lash, FNP  Nurse Waddell Massie Marking, RN

## 2024-03-23 ENCOUNTER — Telehealth (HOSPITAL_COMMUNITY): Payer: Self-pay | Admitting: Cardiology

## 2024-03-23 NOTE — Telephone Encounter (Signed)
 Patient left VM On triage line at 1336 Reports her weight is up 40lb   Returned call Severely SOB/labored during call  (pulse ox while on call HR 96 o2-92 % on room air) weight 430.2 Swelling all over (stomach and legs) Reports abdominal swelling is tearing at skin  Occasional CP  Reports she used a furoscix  last week, reports she has one more in the home Bumex  3 mg daily Spiro 50 mg daily    Reports she was able to get add on appt for 1/16 @ 9

## 2024-03-23 NOTE — Telephone Encounter (Signed)
 Pt aware.

## 2024-03-24 ENCOUNTER — Telehealth (HOSPITAL_COMMUNITY): Payer: Self-pay

## 2024-03-24 NOTE — Telephone Encounter (Signed)
 [facility]  Prior Authorization Request  Patient Name Alyssa Blanchard  Medication Name Dupixent   Prescriber First & Last Name Doyal LILLETTE Lorriane Hildegard  Therapy Status Continuation  Ship By Date 1/20  Additional Information   Contact Information  [facility] [address] [facility], In Triage ([id number]) Specialty Pharmacy [facility] [facility] [phone]) Specialty Pharmacy Phone Number - [phone] Specialty Pharmacy Fax Number - [phone]

## 2024-03-24 NOTE — Telephone Encounter (Signed)
 Called to confirm/remind patient of their appointment at the [facility] on 03/25/24 9:00.   Appointment:   [] Confirmed  [x] Left mess   [] No answer/No voice mail  [] VM Full/unable to leave message  [] Phone not in service  Patient reminded to bring all medications and/or complete list.  Confirmed patient has transportation. Gave directions, instructed to utilize valet parking.

## 2024-03-25 ENCOUNTER — Ambulatory Visit (HOSPITAL_COMMUNITY)
Admission: RE | Admit: 2024-03-25 | Discharge: 2024-03-25 | Disposition: A | Discharge status: Home / Self Care | Source: Ambulatory Visit

## 2024-03-25 ENCOUNTER — Emergency Department (HOSPITAL_BASED_OUTPATIENT_CLINIC_OR_DEPARTMENT_OTHER)
Admission: EM | Admit: 2024-03-25 | Discharge: 2024-03-25 | Disposition: A | Discharge status: Home / Self Care | Attending: Emergency Medicine | Admitting: Emergency Medicine

## 2024-03-25 ENCOUNTER — Emergency Department (HOSPITAL_BASED_OUTPATIENT_CLINIC_OR_DEPARTMENT_OTHER): Admitting: Radiology

## 2024-03-25 ENCOUNTER — Emergency Department (HOSPITAL_BASED_OUTPATIENT_CLINIC_OR_DEPARTMENT_OTHER)

## 2024-03-25 ENCOUNTER — Other Ambulatory Visit: Payer: Self-pay

## 2024-03-25 DIAGNOSIS — I4891 Unspecified atrial fibrillation: Secondary | ICD-10-CM | POA: Insufficient documentation

## 2024-03-25 DIAGNOSIS — Z91040 Latex allergy status: Secondary | ICD-10-CM | POA: Insufficient documentation

## 2024-03-25 DIAGNOSIS — E8779 Other fluid overload: Secondary | ICD-10-CM | POA: Insufficient documentation

## 2024-03-25 DIAGNOSIS — I509 Heart failure, unspecified: Secondary | ICD-10-CM | POA: Diagnosis not present

## 2024-03-25 DIAGNOSIS — R21 Rash and other nonspecific skin eruption: Secondary | ICD-10-CM | POA: Insufficient documentation

## 2024-03-25 DIAGNOSIS — R0602 Shortness of breath: Secondary | ICD-10-CM | POA: Diagnosis present

## 2024-03-25 DIAGNOSIS — E119 Type 2 diabetes mellitus without complications: Secondary | ICD-10-CM | POA: Diagnosis not present

## 2024-03-25 DIAGNOSIS — Z79899 Other long term (current) drug therapy: Secondary | ICD-10-CM | POA: Diagnosis not present

## 2024-03-25 DIAGNOSIS — Z7901 Long term (current) use of anticoagulants: Secondary | ICD-10-CM | POA: Diagnosis not present

## 2024-03-25 DIAGNOSIS — R600 Localized edema: Secondary | ICD-10-CM | POA: Insufficient documentation

## 2024-03-25 LAB — URINALYSIS, W/ REFLEX TO CULTURE (INFECTION SUSPECTED)
Bacteria, UA: NONE SEEN
Bilirubin Urine: NEGATIVE
Glucose, UA: NEGATIVE mg/dL
Hgb urine dipstick: NEGATIVE
Ketones, ur: NEGATIVE mg/dL
Leukocytes,Ua: NEGATIVE
Nitrite: NEGATIVE
Protein, ur: NEGATIVE mg/dL
Specific Gravity, Urine: <1.005 — ABNORMAL LOW (ref 1.005–1.030)
pH: 7 (ref 5.0–8.0)

## 2024-03-25 LAB — COMPREHENSIVE METABOLIC PANEL WITH GFR
ALT: 27 U/L (ref 0–44)
AST: 72 U/L — ABNORMAL HIGH (ref 15–41)
Albumin: 3.9 g/dL (ref 3.5–5.0)
Alkaline Phosphatase: 111 U/L (ref 38–126)
Anion gap: 10 (ref 5–15)
BUN: <5 mg/dL — ABNORMAL LOW (ref 6–20)
CO2: 28 mmol/L (ref 22–32)
Calcium: 9 mg/dL (ref 8.9–10.3)
Chloride: 101 mmol/L (ref 98–111)
Creatinine, Ser: 0.7 mg/dL (ref 0.44–1.00)
GFR, Estimated: >60 mL/min
Glucose, Bld: 143 mg/dL — ABNORMAL HIGH (ref 70–99)
Potassium: 3.9 mmol/L (ref 3.5–5.1)
Sodium: 139 mmol/L (ref 135–145)
Total Bilirubin: 1 mg/dL (ref 0.0–1.2)
Total Protein: 7.1 g/dL (ref 6.5–8.1)

## 2024-03-25 LAB — MAGNESIUM: Magnesium: 1.9 mg/dL (ref 1.7–2.4)

## 2024-03-25 LAB — CBC WITH DIFFERENTIAL/PLATELET
Abs Immature Granulocytes: 0.04 K/uL (ref 0.00–0.07)
Basophils Absolute: 0 K/uL (ref 0.0–0.1)
Basophils Relative: 0 %
Eosinophils Absolute: 0.1 K/uL (ref 0.0–0.5)
Eosinophils Relative: 2 %
HCT: 29.3 % — ABNORMAL LOW (ref 36.0–46.0)
Hemoglobin: 8.5 g/dL — ABNORMAL LOW (ref 12.0–15.0)
Immature Granulocytes: 1 %
Lymphocytes Relative: 12 %
Lymphs Abs: 0.8 K/uL (ref 0.7–4.0)
MCH: 24.6 pg — ABNORMAL LOW (ref 26.0–34.0)
MCHC: 29 g/dL — ABNORMAL LOW (ref 30.0–36.0)
MCV: 84.9 fL (ref 80.0–100.0)
Monocytes Absolute: 0.7 K/uL (ref 0.1–1.0)
Monocytes Relative: 9 %
Neutro Abs: 5.5 K/uL (ref 1.7–7.7)
Neutrophils Relative %: 76 %
Platelets: 150 K/uL (ref 150–400)
RBC: 3.45 MIL/uL — ABNORMAL LOW (ref 3.87–5.11)
RDW: 18.6 % — ABNORMAL HIGH (ref 11.5–15.5)
WBC: 7.2 K/uL (ref 4.0–10.5)
nRBC: 0 % (ref 0.0–0.2)

## 2024-03-25 LAB — PRO BRAIN NATRIURETIC PEPTIDE: Pro Brain Natriuretic Peptide: 351 pg/mL — ABNORMAL HIGH

## 2024-03-25 LAB — TROPONIN T, HIGH SENSITIVITY: Troponin T High Sensitivity: <15 ng/L (ref 0–19)

## 2024-03-25 MED ORDER — FUROSEMIDE 10 MG/ML IJ SOLN
120.0000 mg | Freq: Once | INTRAVENOUS | Status: AC
  Administered 2024-03-25: 120 mg via INTRAVENOUS
  Filled 2024-03-25: qty 12

## 2024-03-25 MED ORDER — BUMETANIDE 1 MG PO TABS: 4.0000 mg | ORAL_TABLET | Freq: Every day | ORAL | 0 refills | Status: DC

## 2024-03-25 MED ORDER — FUROSEMIDE 10 MG/ML IJ SOLN
INTRAMUSCULAR | Status: AC
  Filled 2024-03-25: qty 12

## 2024-03-25 MED ORDER — FUROSEMIDE 10 MG/ML IJ SOLN
40.0000 mg | Freq: Once | INTRAMUSCULAR | Status: AC
  Administered 2024-03-25: 40 mg via INTRAVENOUS
  Filled 2024-03-25: qty 4

## 2024-03-25 NOTE — ED Provider Notes (Signed)
 " [place], [place] EMERGENCY DEPARTMENT AT [facility] Provider Note   CSN: [id number] Arrival date & time: 03/25/24  1009     History Chief Complaint  Patient presents with   Shortness of Breath    HPI: Alyssa Blanchard is a 54 y.o. female with history pertinent for HFpEF (EF of 70 to 75%, February 2025), elevated BMI, pickwickian syndrome, GERD, bipolar disorder in remission, A-fib, T2DM, prolonged QT, OSA who presents complaining of shortness of breath. Patient arrived via POV.  History provided by patient.  No interpreter required during this encounter.  Patient reports that she has a past medical history of heart failure and is on Bumex  3 mg p.o. daily.  Reports that she has been adherent to this medication, however over the past several days she has had progressive shortness of breath, and edema from her legs extending upwards to her lower abdomen.  Reports that she has not had any changes in diet, activity, medication, no recent sick symptoms.  Reports that she occasionally will have chest pain on exertion, most recently yesterday, does endorse dyspnea on exertion, reports that she has had lower extremity edema.  Also reports that she has had increased skin breakdown and rash in her skin folds which she is treating with nystatin  powder at home.  Patient's recorded medical, surgical, social, medication list and allergies were reviewed in the Snapshot window as part of the initial history.   Prior to Admission medications  Medication Sig Start Date End Date Taking? Authorizing Provider  bumetanide  (BUMEX ) 1 MG tablet Take 4 tablets (4 mg total) by mouth daily. 03/25/24 03/29/24 Yes Rogelia Jerilynn RAMAN, MD  doxycycline  (VIBRA -TABS) 100 MG tablet Take 100 mg by mouth daily. TAKE 1 TABLET EVERY DAY FOR 14 DAYS TAKE WITH 8OZ WATER  DONT LIE DOWN FOR 30 MIN 03/16/24 03/30/24 Yes [provider]  nystatin  (MYCOSTATIN /NYSTOP ) powder Apply 1 Application topically 2 (two) times daily.  03/22/24 09/18/24 Yes [provider]  omeprazole (PRILOSEC) 20 MG capsule Take 20 mg by mouth 2 (two) times daily. 02/08/24  Yes [provider]  acetaminophen  (TYLENOL ) 325 MG tablet Take 2 tablets (650 mg total) by mouth every 6 (six) hours as needed for mild pain (pain score 1-3) (or Fever >/= 101). 05/10/23   Angiulli, Toribio PARAS, PA-C  albuterol  (VENTOLIN  HFA) 108 (90 Base) MCG/ACT inhaler Inhale 1 puff into the lungs every 6 (six) hours as needed for wheezing or shortness of breath.    [provider]  apixaban  (ELIQUIS ) 5 MG TABS tablet Take 1 tablet (5 mg total) by mouth 2 (two) times daily. 05/11/23 01/21/25  Angiulli, Toribio PARAS, PA-C  benzonatate  (TESSALON ) 100 MG capsule Take 1 capsule (100 mg total) by mouth every 8 (eight) hours. 02/17/24   Franaszek, Amanda, PA-C  bumetanide  (BUMEX ) 1 MG tablet Take 3 tablets (3 mg total) by mouth daily. 11/16/23   Colletta Manuelita Garre, PA-C  cetirizine  (ZYRTEC ) 10 MG tablet Take 1 tablet (10 mg total) by mouth daily. 05/11/23   Angiulli, Daniel J, PA-C  clindamycin (CLEOCIN T) 1 % lotion Apply topically as needed.    [provider]  diclofenac  Sodium (VOLTAREN  ARTHRITIS PAIN) 1 % GEL Apply 2 g topically 2 (two) times daily. 05/11/23   Angiulli, Toribio PARAS, PA-C  docusate sodium  (COLACE) 100 MG capsule Take 200 mg by mouth as needed for mild constipation.    [provider]  dofetilide  (TIKOSYN ) 250 MCG capsule Take 1 capsule (250 mcg total) by mouth  2 (two) times daily. 12/18/23   Lesia Ozell Barter, PA-C  DULERA  200-5 MCG/ACT AERO Inhale 2 puffs into the lungs 2 (two) times daily. 01/08/24   [provider]  Dupilumab  (DUPIXENT ) 300 MG/2ML SOAJ Inject 300 mg into the skin every 14 (fourteen) days.    [provider]  Furosemide  (FUROSCIX ) 80 MG/10ML CTKT Inject 80 mg into the skin daily. 01/22/24   [place], Harlene HERO, FNP  Lifitegrast  (XIIDRA ) 5 % SOLN Place 1 drop into both eyes in the morning and  at bedtime.    [provider]  losartan  (COZAAR ) 25 MG tablet Take 1 tablet (25 mg total) by mouth daily. 12/01/23 02/29/24  Vannie Reche RAMAN, NP  magnesium  oxide (MAG-OX) 400 MG tablet Take 1 tablet (400 mg total) by mouth daily. 05/11/23   Angiulli, Toribio PARAS, PA-C  melatonin 5 MG TABS Take 2 tablets (10 mg total) by mouth at bedtime. 05/11/23   Angiulli, Toribio PARAS, PA-C  metoprolol  succinate (TOPROL -XL) 25 MG 24 hr tablet Take 2 tablets (50 mg total) by mouth daily. 06/16/23   Zenaida Morene PARAS, MD  Multiple Vitamins-Minerals (BARIATRIC MULTIVITAMINS/IRON  PO) Take 1 tablet by mouth daily with breakfast.    [provider]  Pantoprazole  Sodium (PROTONIX  PO) Take 2 mg by mouth 2 (two) times daily.    [provider]  polyethylene glycol (MIRALAX  / GLYCOLAX ) 17 g packet Take 17 g by mouth as needed.    [provider]  potassium chloride  SA (KLOR-CON  M) 20 MEQ tablet 4 TABS IN THE MORNING AND DAILY WITH LUNCH AND 3 TABS EVERY EVENING TAKE AN ADDITIONAL 2 TABS WHEN YOU TAKE METOLAZONE  03/17/24   Marcine Catalan M, PA-C  pregabalin  (LYRICA ) 200 MG capsule Take 200 mg by mouth 2 (two) times daily.    [provider]  silver  sulfADIAZINE  (SILVADENE ) 1 % cream Apply topically daily. Apply to right side 05/11/23   Angiulli, Toribio PARAS, PA-C  spironolactone  (ALDACTONE ) 25 MG tablet TAKE 2 TABLETS BY MOUTH EVERY DAY 03/23/24   Zenaida Morene PARAS, MD     Allergies: Gabapentin, Topiramate , Albuterol , Hydroxyzine , Imitrex  [sumatriptan ], Latex, Metformin, Montelukast , Other, Prozac [fluoxetine], Semaglutide , Trazodone  and nefazodone, Advair diskus [fluticasone -salmeterol], Copper-containing compounds, and Tape   Review of Systems   ROS as per HPI  Physical Exam Updated Vital Signs BP (!) 121/49   Pulse 85   Temp 99.1 F (37.3 C) (Oral)   Resp 16   LMP 12/20/2017   SpO2 96%  Physical Exam Vitals and nursing note reviewed.  Constitutional:      General: She is not  in acute distress.    Appearance: She is well-developed.  HENT:     Head: Normocephalic and atraumatic.  Eyes:     Conjunctiva/sclera: Conjunctivae normal.  Cardiovascular:     Rate and Rhythm: Normal rate and regular rhythm.     Heart sounds: No murmur heard. Pulmonary:     Effort: Pulmonary effort is normal. No respiratory distress.     Breath sounds: Examination of the right-lower field reveals decreased breath sounds. Examination of the left-lower field reveals decreased breath sounds. Decreased breath sounds present.  Abdominal:     Palpations: Abdomen is soft.     Tenderness: There is no abdominal tenderness.  Musculoskeletal:        General: No swelling.     Cervical back: Neck supple.     Right lower leg: Edema present.     Left lower leg: Edema present.  Skin:  General: Skin is warm and dry.     Capillary Refill: Capillary refill takes less than 2 seconds.     Comments: Intertrigo of skin fold underneath pannus  Neurological:     Mental Status: She is alert.  Psychiatric:        Mood and Affect: Mood normal.     ED Course/ Medical Decision Making/ A&P    Procedures Procedures   Medications Ordered in ED Medications  furosemide  (LASIX ) 120 mg in dextrose  5 % 50 mL IVPB (0 mg Intravenous Stopped 03/25/24 1316)  furosemide  (LASIX ) injection 40 mg (40 mg Intravenous Given 03/25/24 1518)    Medical Decision Making:   Bobbi Yount is a 54 y.o. female who presents for shortness of breath as per above.  Physical exam is pertinent for lateral lower extremity edema, intertrigo, diminished breath sounds at the bilateral bases.   The differential includes but is not limited to heart failure exacerbation, volume overload, ACS, hypomagnesemia, AKI, metabolic derangement.  Independent historian: None  External data reviewed: Labs: reviewed prior labs for baseline  Initial Plan:  Screening labs including CBC and Metabolic panel to evaluate for infectious or  metabolic etiology of disease.  Screening magnesium  given patient with history of prolonged QT Urinalysis with reflex culture ordered to evaluate for UTI or relevant urologic/nephrologic pathology.  Chest x-ray to evaluate for structural/infectious intra-thoracic pathology.  EKG and single troponin to evaluate for cardiac pathology. Single troponin appropriate due to greater than 6 hours since maximal intensity of symptoms. Objective evaluation as below reviewed   Labs: Ordered, Independent interpretation, and Details: Troponin undetectable.  BNP stable in comparison to prior, UA without evidence of UTI, CBC without leukocytosis or thrombocytopenia, stable anemia on comparison to prior.  CMP without AKI, emergent electrolyte derangement, emergent LFT abnormality.  Radiology: Ordered, Independent interpretation, Details: Chest x-ray without focal airspace opacification, similar cardiomegaly on comparison to prior, no pneumothorax, large pleural effusion, bony derangement, and All images reviewed independently.  Agree with radiology report at this time.   DG Chest Portable 1 View Result Date: 03/25/2024 CLINICAL DATA:  Shortness of breath. EXAM: PORTABLE CHEST 1 VIEW COMPARISON:  02/17/2024 FINDINGS: Again noted are low lung volumes but limited evaluation due to body habitus. Cardiac silhouette appears to be enlarged but stable. Prominent central vascular markings without overt pulmonary edema. Difficult to exclude atelectasis in the left mid lung region. IMPRESSION: 1. Probable stable cardiomegaly. 2. Slightly prominent central vascular markings without overt pulmonary edema. Electronically Signed   By: Juliene Balder M.D.   On: 03/25/2024 13:44    EKG/Medicine tests: Ordered and Independent interpretation EKG Interpretation: Sinus rhythm Atrial premature complex Low voltage, precordial leads Confirmed by Rogelia Satterfield ([id number]) on 03/25/2024 12:23:58 PM                Interventions: Lasix   See the EMR  for full details regarding lab and imaging results.  Patient presents for subjective volume overload despite adherence to her medications.  Patient saturating well on room air at rest, however does have bilateral lower extremity edema.  Will obtain screening labs and chest x-ray and EKG as per initial plan above.  Additionally patient takes 3 mg p.o. Bumex  at baseline, therefore will order 120 IV Lasix  as starting dose while waiting for labs.  Labs obtained and overall reassuring, patient does have mild elevation of BNP, however not elevated to a point worrisome for severe decompensated heart failure.  Troponin WNL, do not feel the patient requires delta  because chest pain episode was greater than 6 hours ago, no current chest pain.  Doubt ACS.  Remainder of labs additionally reassuring.  Patient had significant urine output with 120 of Lasix , states that she feels symptomatically better.  Patient was ambulatory and maintained oxygenation in the high 90s on ambulation, thus do feel that patient is stable for continued outpatient management.  Prescribed increased dose of Bumex  at 4 mg (3 mg baseline) for 4 days.  Patient does have a follow-up with heart failure clinic next Wednesday, encouraged patient to keep this appointment.  Ultimately patient comfortable with plan for discharge, patient did request an additional dose of 40 IV Lasix  prior to discharge which is not unreasonable, and was administered.  Presentation is most consistent with exacerbation of chronic illness  Discussion of management or test interpretations with external provider(s): Not indicated  Risk Drugs:Prescription drug management  Disposition: DISCHARGE: I believe that the patient is safe for discharge home with outpatient follow-up. Patient was informed of all pertinent physical exam, laboratory, and imaging findings. Patient's suspected etiology of their symptom presentation was discussed with the patient and all questions were  answered. We discussed following up with PCP, heart failure clinic. I provided thorough ED return precautions. The patient feels safe and comfortable with this plan.  MDM generated using voice dictation software and may contain dictation errors.  Please contact me for any clarification or with any questions.  Clinical Impression:  1. Other hypervolemia      Discharge   Final Clinical Impression(s) / ED Diagnoses Final diagnoses:  Other hypervolemia    Rx / DC Orders ED Discharge Orders          Ordered    bumetanide  (BUMEX ) 1 MG tablet  Daily        03/25/24 1433             Rogelia Jerilynn RAMAN, MD 03/25/24 1617  "

## 2024-03-25 NOTE — ED Notes (Signed)
 ED Provider at bedside.

## 2024-03-25 NOTE — Discharge Instructions (Signed)
 Alyssa Blanchard  Thank you for allowing us  to take care of you today.  You came to the Emergency Department today because you felt like you are building up too much fluid and your Bumex  was not working.  Here in the emergency department your workup is overall reassuring, you do have some mild elevation of your heart failure number, however not to the degree of a heart failure exacerbation/decompensation.  We gave you some Lasix  and you were able to get off a significant amount of fluid.  Additionally your oxygen remains normal while at rest and with walking, thus you are safe to go home and continue your Bumex  outpatient and follow-up with the heart failure clinic next Wednesday.  Please take 4 mg of Bumex  daily for the next 4 days, and then return to your usual 3 mg of Bumex .   To-Do: 1. Please follow-up with your primary doctor within 1 - 2 weeks / as soon as possible.   Please return to the Emergency Department or call 911 if you experience have worsening of your symptoms, or do not get better, chest pain, shortness of breath, severe or significantly worsening pain, high fever, severe confusion, pass out or have any reason to think that you need emergency medical care.   We hope you feel better soon.   Mitzie Later, MD Department of Emergency Medicine [facility]

## 2024-03-25 NOTE — ED Triage Notes (Signed)
 Reports fluid overload x 4 days. Hx of CHF. States 10 lbs increase overnight. No recent med changes or missed doses.

## 2024-03-25 NOTE — Progress Notes (Incomplete)
 "  ADVANCED HEART FAILURE CLINIC NOTE  Primary Care: [name], MD Primary Cardiologist: Dr. Zenaida  Reason for visit: f/u for heart failure   HPI: Alyssa Blanchard is a 54 y.o. female with a PMH of HFpEF, untreated OSA/OHS, asthma, PAF, morbid obesity w/ prior laparoscopic sleeve gastrectomy in 2016, DM II, and HTN.  Admitted to [facility] in 1/25 with acute on chronic HFpEF and RLE cellulitis.   She was readmitted to [facility] in 2/25 with volume overload, Afib with RVR. Echo with EF 70-75%, RV okay, RVSP 39 mmHg. Diuresed with IV lasix . Later underwent TEE/DCCV to SR. She was also treated for norovirus, RLE cellulitis, E. Coli UTI then candida vaginosis. Discharged to CIR, weight 392 lbs.   She saw [facility] AHF/transplant team for consult 05/29/23.   Admitted multiple times in 2025 for volume overload.  Follow up 07/27/23, had gained 24 lbs in 2 months. She was prescribed furoscix  and metolazone  x 3 days.   Seen in ED 08/18/23 for AF. Urgent follow up 08/19/23, Bumex  increased to 3 mg bid, continued metolazone  3x/week.  Follow up with EP 6/25, given flecainide  for PRN use for AF episodes. Not an ablation candidate at current weight.  Admitted 10/25 with AF with RVR. Seen by EP and started Tikosyn , converted to NSR. Dose reduced to 250 mg bid due to prolonged QTc. Metolazone  changed to PRN. Discharged home, weight 373 lbs.   Recently struggled with recurrent volume overload. Has required fuorscix on multiple occasions. Last seen 01/28/24, had not been able to complete fuorscix at home d/t GI illness. Weight up 30 lb from discharge in October. Prescribed 3 days of furoscix .  She cancelled the last few appts made by our clinic. Patient is here for urgent CHF visit.    PMH, current medications, allergies, social history, and family history reviewed in epic.  Wt Readings from Last 3 Encounters:  02/17/24 (!) 181.4 kg (400 lb)  01/28/24 (!) 182.8 kg (403 lb)  01/25/24 (!) 180.4 kg (397 lb  9.6 oz)   LMP 12/20/2017   PHYSICAL EXAM: There were no vitals filed for this visit.  GENERAL: super morbid obesity,  NAD Lungs- clear CARDIAC:  thick neck, JVD not well visualized          Normal rate with regular rhythm. No MRG. Obese legs w/o pitting edema  ABDOMEN: obese, soft, non-tender, non-distended.  EXTREMITIES: Warm and well perfused.  NEUROLOGIC: No obvious FND    DATA REVIEW  ECG: NSR w/ PACs 89 bpm, QRS 438 ms   ECHO: 04/24/2023: LVEF 70 to 75%, normal E prime, normal RV systolic function, mildly elevated RVSP, no gross valvular abnormalities  CATH: R/LHC at [facility] 9/23: RA mean 24, PA mean 49, PCWP mean 34, PVR 1.3 WU, Fick CI 2.01, TD CI 4.07, LVEDP 32, no CAD    ASSESSMENT & PLAN:  1. Chronic diastolic heart failure: Multiple readmissions, volume status obviously difficult given body habitus. Suspect largely due to morbid obesity and untreated sleep apnea.  - NYHA *** - Use Furoscix  + 40 KCL daily x 3 days (hold Bumex  while using Furoscix ) - After 3 days, resume home dose of Bumex  3 mg daily (orthostatic on 3 mg bid) - Avoid metolazone  with Tikosyn  - Can use PRN Furoscix  going forward - Continue spironolactone  50 mg daily. - Continue Toprol  XL 50 mg daily. - Continue losartan  25 mg daily; plan switch to Entresto next visit. - Check BMP and Mg level today    2.  Morbid obesity: Previous gastric sleeve. - There is no height or weight on file to calculate BMI. - Now on GLP-1  3. PAF: Previously on tikosyn , s/p DCCV 2/25.  - 2 day holter monitor (3/25, [facility]):  mostly NSR, frequent short runs of SVT, rare PVCs and PACs - Now established with [facility] EP, and plugged into AF clinic. Now on Tikosyn   - EKG today shows NSR w/ PACs, QTc ok - Continue Tikosyn  per EP  - Check K and Mg level  - Continue Eliquis  5 mg bid  4. HTN: controlled on current regimen - consider addition of ARNI next visit is SCr remains stable   5. OSA:  - Sleep study at [facility] in 05/25  with moderate OSA - Has CPAP, planning on starting soon  F/u     [facility], Xitlally Mooneyham N, PA-C  Advanced Heart Failure 03/25/24 "

## 2024-03-25 NOTE — ED Notes (Signed)
 Ambulated pt on pulse ox. Pt reports still feeling SOB but not as bad when I first got here. O2 sat remained at 97-99% on Room Air. EDP made aware.

## 2024-03-29 ENCOUNTER — Telehealth (HOSPITAL_COMMUNITY): Payer: Self-pay

## 2024-03-29 NOTE — Telephone Encounter (Signed)
 Called to confirm/remind patient of their appointment at the Advanced Heart Failure Clinic on 03/29/24.   Appointment:   [] Confirmed  [x] Left mess   [] No answer/No voice mail  [] VM Full/unable to leave message  [] Phone not in service  Patient reminded to bring all medications and/or complete list.  Confirmed patient has transportation. Gave directions, instructed to utilize valet parking.

## 2024-03-30 ENCOUNTER — Ambulatory Visit (HOSPITAL_COMMUNITY)

## 2024-03-30 NOTE — Progress Notes (Incomplete)
 "  ADVANCED HEART FAILURE CLINIC NOTE  Primary Care: Alyssa Hansel Atlas, MD Primary Cardiologist: Dr. Zenaida   HPI: Alyssa Blanchard is a 54 y.o. female with a PMH of HFpEF, untreated OSA/OHS, asthma, PAF, morbid obesity w/ prior laparoscopic sleeve gastrectomy in 2016, DM II, and HTN.  Admitted to [facility] in 1/25 with acute on chronic HFpEF and RLE cellulitis.   She was readmitted to [facility] in 2/25 with volume overload, Afib with RVR. Echo with EF 70-75%, RV okay, RVSP 39 mmHg. Diuresed with IV lasix . Later underwent TEE/DCCV to SR. She was also treated for norovirus, RLE cellulitis, E. Coli UTI then candida vaginosis. Discharged to CIR, weight 392 lbs.   She saw [facility] AHF/transplant team for consult 05/29/23.   Admitted multiple times in 2025 for volume overload.  Follow up 07/27/23, had gained 24 lbs in 2 months. She was prescribed furoscix  and metolazone  x 3 days.   Seen in ED 08/18/23 for AF. Urgent follow up 08/19/23, Bumex  increased to 3 mg bid, continued metolazone  3x/week.  Follow up with EP 6/25, given flecainide  for PRN use for AF episodes. Not an ablation candidate at current weight.  Admitted 10/25 with AF with RVR. Seen by EP and started Tikosyn , converted to NSR. Dose reduced to 250 mg bid due to prolonged QTc. Metolazone  changed to PRN. Discharged home, weight 373 lbs.   Recently struggled with recurrent volume overload. Has required fuorscix on multiple occasions. Last seen 01/28/24, had not been able to complete fuorscix at home d/t GI illness. Weight up 30 lb from discharge in October. Prescribed 3 days of furoscix .  She cancelled the last few appts made by our clinic. Patient is here for urgent CHF visit.    PMH, current medications, allergies, social history, and family history reviewed in epic.  Wt Readings from Last 3 Encounters:  02/17/24 (!) 181.4 kg (400 lb)  01/28/24 (!) 182.8 kg (403 lb)  01/25/24 (!) 180.4 kg (397 lb 9.6 oz)   LMP 12/20/2017   PHYSICAL  EXAM: There were no vitals filed for this visit.  GENERAL: super morbid obesity,  NAD Lungs- clear CARDIAC:  thick neck, JVD not well visualized          Normal rate with regular rhythm. No MRG. Obese legs w/o pitting edema  ABDOMEN: obese, soft, non-tender, non-distended.  EXTREMITIES: Warm and well perfused.  NEUROLOGIC: No obvious FND    DATA REVIEW  ECG: NSR w/ PACs 89 bpm, QRS 438 ms   ECHO: 04/24/2023: LVEF 70 to 75%, normal E prime, normal RV systolic function, mildly elevated RVSP, no gross valvular abnormalities  CATH: R/LHC at [facility] 9/23: RA mean 24, PA mean 49, PCWP mean 34, PVR 1.3 WU, Fick CI 2.01, TD CI 4.07, LVEDP 32, no CAD    ASSESSMENT & PLAN:  1. Chronic diastolic heart failure: Multiple readmissions, volume status obviously difficult given body habitus. Suspect largely due to morbid obesity and untreated sleep apnea.  - NYHA *** - Use Furoscix  + 40 KCL daily x 3 days (hold Bumex  while using Furoscix ) - After 3 days, resume home dose of Bumex  3 mg daily (orthostatic on 3 mg bid) - Avoid metolazone  with Tikosyn  - Can use PRN Furoscix  going forward - Continue spironolactone  50 mg daily. - Continue Toprol  XL 50 mg daily. - Continue losartan  25 mg daily; plan switch to Entresto next visit. - Check BMP and Mg level today    2. Morbid obesity: Previous gastric sleeve. - There is  no height or weight on file to calculate BMI. - Now on GLP-1  3. PAF: Previously on tikosyn , s/p DCCV 2/25.  - 2 day holter monitor (3/25, [facility]):  mostly NSR, frequent short runs of SVT, rare PVCs and PACs - Now established with [facility] EP, and plugged into AF clinic. Now on Tikosyn   - EKG today shows NSR w/ PACs, QTc ok - Continue Tikosyn  per EP  - Check K and Mg level  - Continue Eliquis  5 mg bid  4. HTN: controlled on current regimen - consider addition of ARNI next visit is SCr remains stable   5. OSA:  - Sleep study at [facility] in 05/25 with moderate OSA - Has CPAP, planning  on starting soon  F/u     [place], Aylla Huffine N, PA-C  Advanced Heart Failure 03/30/24 "

## 2024-04-05 ENCOUNTER — Ambulatory Visit (HOSPITAL_COMMUNITY)
Admission: RE | Admit: 2024-04-05 | Discharge: 2024-04-05 | Disposition: A | Discharge status: Home / Self Care | Source: Ambulatory Visit

## 2024-04-05 NOTE — Progress Notes (Incomplete)
 "  ADVANCED HEART FAILURE CLINIC NOTE  Primary Care: [name], MD Primary Cardiologist: Dr. Zenaida  HPI: Alyssa Blanchard is a 54 y.o. female with a PMH of HFpEF, untreated OSA/OHS, asthma, PAF, morbid obesity w/ prior laparoscopic sleeve gastrectomy in 2016, DM II, and HTN.  Admitted to [facility] in 1/25 with acute on chronic HFpEF and RLE cellulitis.   She was readmitted to [facility] in 2/25 with volume overload, Afib with RVR. Echo with EF 70-75%, RV okay, RVSP 39 mmHg. Diuresed with IV lasix . Later underwent TEE/DCCV to SR. She was also treated for norovirus, RLE cellulitis, E. Coli UTI then candida vaginosis. Discharged to CIR, weight 392 lbs.   She saw [facility] AHF/transplant team for consult 05/29/23.   Admitted multiple times in 2025 for volume overload.  Follow up 07/27/23, had gained 24 lbs in 2 months. She was prescribed furoscix  and metolazone  x 3 days.   Seen in ED 08/18/23 for AF. Urgent follow up 08/19/23, Bumex  increased to 3 mg bid, continued metolazone  3x/week.  Follow up with EP 6/25, given flecainide  for PRN use for AF episodes. Not an ablation candidate at current weight.  Admitted 10/25 with AF with RVR. Seen by EP and started Tikosyn , converted to NSR. Dose reduced to 250 mg bid due to prolonged QTc. Metolazone  changed to PRN. Discharged home, weight 373 lbs.   Recently struggled with recurrent volume overload. Has required fuorscix on multiple occasions. Last seen 01/28/24, had not been able to complete fuorscix at home d/t GI illness. Weight up 30 lb from discharge in October. Prescribed 3 days of furoscix .  She cancelled the last few appts made by our clinic. Was scheduled for acute visit last week, however cancelled this as well.   She returns today for HF follow up. Overall feeling ***. NYHA ***. Reports {Symptoms; cardiac:12860::dyspnea,fatigue}. Denies {Symptoms; cardiac:12860::chest  pain,dyspnea,fatigue,near-syncope,orthopnea,palpitations,dizziness,abnormal bleeding}. Able to perform ADLs. Appetite okay. Weight at home ***. BP at home***. Compliant with all medications.    PMH, current medications, allergies, social history, and family history reviewed in epic.  Wt Readings from Last 3 Encounters:  02/17/24 (!) 181.4 kg (400 lb)  01/28/24 (!) 182.8 kg (403 lb)  01/25/24 (!) 180.4 kg (397 lb 9.6 oz)   LMP 12/20/2017   PHYSICAL EXAM: There were no vitals filed for this visit.  General: *** appearing. No distress  Cardiac: JVP ~*** cm. No murmurs  Resp: Lung sounds clear and equal B/L Abdomen: Soft, non-distended.  Extremities: Warm and dry.  *** edema.  Neuro: A&O x3. Affect pleasant.   DATA REVIEW  ECG:   ECHO: 2/25: LVEF 70-75%, normal E prime, normal RV systolic function, mildly elevated RVSP, no gross valvular abnormalities  CATH: R/LHC at [facility] 9/23: RA mean 24, PA mean 49, PCWP mean 34, PVR 1.3 WU, Fick CI 2.01, TD CI 4.07, LVEDP 32, no CAD    ASSESSMENT & PLAN:  1. Chronic diastolic heart failure: Multiple readmissions, volume status obviously difficult given body habitus. Suspect largely due to morbid obesity and untreated sleep apnea.  - NYHA *** - Use Furoscix  + 40 KCL daily x 3 days (hold Bumex  while using Furoscix ) - After 3 days, resume home dose of Bumex  3 mg daily (orthostatic on 3 mg bid) - Avoid metolazone  with Tikosyn  - Can use PRN Furoscix  going forward - Continue spironolactone  50 mg daily. - Continue Toprol  XL 50 mg daily. - Continue losartan  25 mg daily; plan switch to Entresto  next visit. - Check BMP and  Mg level today    2. Morbid obesity: Previous gastric sleeve. - There is no height or weight on file to calculate BMI. - Now on GLP-1  3. PAF: Previously on tikosyn , s/p DCCV 2/25.  - 2 day holter monitor (3/25, [facility]):  mostly NSR, frequent short runs of SVT, rare PVCs and PACs - Now established with [facility]  EP, and plugged into AF clinic. Now on Tikosyn   - EKG today shows NSR w/ PACs, QTc ok - Continue Tikosyn  per EP  - Check K and Mg level  - Continue Eliquis  5 mg bid  4. HTN: controlled on current regimen - consider addition of ARNI next visit is SCr remains stable   5. OSA:  - Sleep study at [facility] in 05/25 with moderate OSA - Has CPAP, planning on starting soon  Follow up in ** with ***   Alyssa Thurow, NP 04/05/24 "

## 2024-04-05 NOTE — Progress Notes (Incomplete)
 "  ADVANCED HEART FAILURE CLINIC NOTE  Primary Care: [name], MD Primary Cardiologist: Dr. Zenaida  HPI: Alyssa Blanchard is a 54 y.o. female with a PMH of HFpEF, untreated OSA/OHS, asthma, PAF, morbid obesity w/ prior laparoscopic sleeve gastrectomy in 2016, DM II, and HTN.  Admitted to [facility] in 1/25 with acute on chronic HFpEF and RLE cellulitis.   She was readmitted to [facility] in 2/25 with volume overload, Afib with RVR. Echo with EF 70-75%, RV okay, RVSP 39 mmHg. Diuresed with IV lasix . Later underwent TEE/DCCV to SR. She was also treated for norovirus, RLE cellulitis, E. Coli UTI then candida vaginosis. Discharged to CIR, weight 392 lbs.   She saw [facility] AHF/transplant team for consult 05/29/23.   Admitted multiple times in 2025 for volume overload.  Follow up 07/27/23, had gained 24 lbs in 2 months. She was prescribed furoscix  and metolazone  x 3 days.   Seen in ED 08/18/23 for AF. Urgent follow up 08/19/23, Bumex  increased to 3 mg bid, continued metolazone  3x/week.  Follow up with EP 6/25, given flecainide  for PRN use for AF episodes. Not an ablation candidate at current weight.  Admitted 10/25 with AF with RVR. Seen by EP and started Tikosyn , converted to NSR. Dose reduced to 250 mg bid due to prolonged QTc. Metolazone  changed to PRN. Discharged home, weight 373 lbs.   Recently struggled with recurrent volume overload. Has required fuorscix on multiple occasions. Last seen 01/28/24, had not been able to complete fuorscix at home d/t GI illness. Weight up 30 lb from discharge in October. Prescribed 3 days of furoscix .  She cancelled the last few appts made by our clinic. Was scheduled for acute visit last week, however cancelled this as well.   She returns today for HF follow up. Overall feeling ***. NYHA ***. Reports {Symptoms; cardiac:12860::dyspnea,fatigue}. Denies {Symptoms; cardiac:12860::chest  pain,dyspnea,fatigue,near-syncope,orthopnea,palpitations,dizziness,abnormal bleeding}. Able to perform ADLs. Appetite okay. Weight at home ***. BP at home***. Compliant with all medications.    PMH, current medications, allergies, social history, and family history reviewed in epic.  Wt Readings from Last 3 Encounters:  02/17/24 (!) 181.4 kg (400 lb)  01/28/24 (!) 182.8 kg (403 lb)  01/25/24 (!) 180.4 kg (397 lb 9.6 oz)   LMP 12/20/2017   PHYSICAL EXAM: There were no vitals filed for this visit.  General: *** appearing. No distress  Cardiac: JVP ~*** cm. No murmurs  Resp: Lung sounds clear and equal B/L Abdomen: Soft, non-distended.  Extremities: Warm and dry.  *** edema.  Neuro: A&O x3. Affect pleasant.   DATA REVIEW  ECG:   ECHO: 2/25: LVEF 70-75%, normal E prime, normal RV systolic function, mildly elevated RVSP, no gross valvular abnormalities  CATH: R/LHC at [facility] 9/23: RA mean 24, PA mean 49, PCWP mean 34, PVR 1.3 WU, Fick CI 2.01, TD CI 4.07, LVEDP 32, no CAD    ASSESSMENT & PLAN:  1. Chronic diastolic heart failure: Multiple readmissions, volume status obviously difficult given body habitus. Suspect largely due to morbid obesity and untreated sleep apnea.  - NYHA *** - Use Furoscix  + 40 KCL daily x 3 days (hold Bumex  while using Furoscix ) - After 3 days, resume home dose of Bumex  3 mg daily (orthostatic on 3 mg bid) - Avoid metolazone  with Tikosyn  - Can use PRN Furoscix  going forward - Continue spironolactone  50 mg daily. - Continue Toprol  XL 50 mg daily. - Continue losartan  25 mg daily; plan switch to Entresto  next visit. - Check BMP and  Mg level today    2. Morbid obesity: Previous gastric sleeve. - There is no height or weight on file to calculate BMI. - Now on GLP-1  3. PAF: Previously on tikosyn , s/p DCCV 2/25.  - 2 day holter monitor (3/25, [facility]):  mostly NSR, frequent short runs of SVT, rare PVCs and PACs - Now established with [facility]  EP, and plugged into AF clinic. Now on Tikosyn   - EKG today shows NSR w/ PACs, QTc ok - Continue Tikosyn  per EP  - Check K and Mg level  - Continue Eliquis  5 mg bid  4. HTN: controlled on current regimen - consider addition of ARNI next visit is SCr remains stable   5. OSA:  - Sleep study at [facility] in 05/25 with moderate OSA - Has CPAP, planning on starting soon  Follow up in ** with ***   Starlette Thurow, NP 04/05/24 "

## 2024-04-06 ENCOUNTER — Telehealth (HOSPITAL_COMMUNITY): Payer: Self-pay

## 2024-04-06 ENCOUNTER — Ambulatory Visit (HOSPITAL_BASED_OUTPATIENT_CLINIC_OR_DEPARTMENT_OTHER): Admitting: Physical Therapy

## 2024-04-06 NOTE — Telephone Encounter (Signed)
 Called to confirm/remind patient of their appointment at the Advanced Heart Failure Clinic on 04/07/24.   Appointment:   [] Confirmed  [x] Left mess   [] No answer/No voice mail  [] VM Full/unable to leave message  [] Phone not in service  Patient reminded to bring all medications and/or complete list.  Confirmed patient has transportation. Gave directions, instructed to utilize valet parking.

## 2024-04-07 ENCOUNTER — Telehealth (HOSPITAL_COMMUNITY): Payer: Self-pay

## 2024-04-07 ENCOUNTER — Other Ambulatory Visit (HOSPITAL_COMMUNITY): Payer: Self-pay

## 2024-04-07 ENCOUNTER — Other Ambulatory Visit: Payer: Self-pay | Admitting: Pharmacy Technician

## 2024-04-07 ENCOUNTER — Ambulatory Visit (HOSPITAL_COMMUNITY)
Admission: RE | Admit: 2024-04-07 | Discharge: 2024-04-07 | Disposition: A | Discharge status: Home / Self Care | Source: Ambulatory Visit | Attending: Cardiology | Admitting: Cardiology

## 2024-04-07 ENCOUNTER — Encounter (HOSPITAL_COMMUNITY): Payer: Self-pay

## 2024-04-07 ENCOUNTER — Other Ambulatory Visit: Payer: Self-pay

## 2024-04-07 VITALS — BP 144/70 | HR 86 | Wt >= 6400 oz

## 2024-04-07 DIAGNOSIS — Z6841 Body Mass Index (BMI) 40.0 and over, adult: Secondary | ICD-10-CM | POA: Diagnosis not present

## 2024-04-07 DIAGNOSIS — I5032 Chronic diastolic (congestive) heart failure: Secondary | ICD-10-CM

## 2024-04-07 DIAGNOSIS — I5033 Acute on chronic diastolic (congestive) heart failure: Secondary | ICD-10-CM | POA: Diagnosis present

## 2024-04-07 DIAGNOSIS — E119 Type 2 diabetes mellitus without complications: Secondary | ICD-10-CM | POA: Diagnosis not present

## 2024-04-07 DIAGNOSIS — Z7901 Long term (current) use of anticoagulants: Secondary | ICD-10-CM | POA: Insufficient documentation

## 2024-04-07 DIAGNOSIS — J45909 Unspecified asthma, uncomplicated: Secondary | ICD-10-CM | POA: Diagnosis not present

## 2024-04-07 DIAGNOSIS — Z79899 Other long term (current) drug therapy: Secondary | ICD-10-CM | POA: Diagnosis not present

## 2024-04-07 DIAGNOSIS — I10 Essential (primary) hypertension: Secondary | ICD-10-CM

## 2024-04-07 DIAGNOSIS — G4733 Obstructive sleep apnea (adult) (pediatric): Secondary | ICD-10-CM | POA: Diagnosis not present

## 2024-04-07 DIAGNOSIS — I110 Hypertensive heart disease with heart failure: Secondary | ICD-10-CM | POA: Insufficient documentation

## 2024-04-07 DIAGNOSIS — I480 Paroxysmal atrial fibrillation: Secondary | ICD-10-CM | POA: Diagnosis not present

## 2024-04-07 DIAGNOSIS — E8770 Fluid overload, unspecified: Secondary | ICD-10-CM | POA: Insufficient documentation

## 2024-04-07 LAB — BASIC METABOLIC PANEL WITH GFR
Anion gap: 12 (ref 5–15)
BUN: 7 mg/dL (ref 6–20)
CO2: 24 mmol/L (ref 22–32)
Calcium: 9.4 mg/dL (ref 8.9–10.3)
Chloride: 101 mmol/L (ref 98–111)
Creatinine, Ser: 0.76 mg/dL (ref 0.44–1.00)
GFR, Estimated: >60 mL/min
Glucose, Bld: 105 mg/dL — ABNORMAL HIGH (ref 70–99)
Potassium: 4.4 mmol/L (ref 3.5–5.1)
Sodium: 137 mmol/L (ref 135–145)

## 2024-04-07 LAB — MAGNESIUM: Magnesium: 2 mg/dL (ref 1.7–2.4)

## 2024-04-07 MED ORDER — FUROSCIX 80 MG/10ML ~~LOC~~ CTKT
80.0000 mg | CARTRIDGE | Freq: Every day | SUBCUTANEOUS | 1 refills | Status: DC
  Filled 2024-04-07: qty 3, 1d supply, fill #0
  Filled 2024-04-07: qty 3, 3d supply, fill #0

## 2024-04-07 MED ORDER — ENTRESTO 24-26 MG PO TABS: 1.0000 | ORAL_TABLET | Freq: Two times a day (BID) | ORAL | 11 refills | Status: DC

## 2024-04-07 MED ORDER — FUROSEMIDE 10 MG/ML IJ SOLN: 80.0000 mg | Freq: Once | INTRAMUSCULAR | Status: DC

## 2024-04-07 MED ORDER — POTASSIUM CHLORIDE CRYS ER 20 MEQ PO TBCR: 40.0000 meq | EXTENDED_RELEASE_TABLET | Freq: Once | ORAL | Status: DC

## 2024-04-07 MED ORDER — MAGNESIUM OXIDE 400 MG PO TABS: 400.0000 mg | ORAL_TABLET | Freq: Every day | ORAL | 1 refills | Status: AC

## 2024-04-07 MED ORDER — METOPROLOL SUCCINATE ER 25 MG PO TB24: 25.0000 mg | ORAL_TABLET | Freq: Every day | ORAL | 3 refills | Status: AC

## 2024-04-07 MED ORDER — BUMETANIDE 1 MG PO TABS: ORAL_TABLET | ORAL | 6 refills | Status: DC

## 2024-04-07 NOTE — Patient Instructions (Addendum)
 Good to see you today!  Labs done today, your results will be available in MyChart, we will contact you for abnormal readings.  DECREASE toprol   to 25 mg daily  STOP losartan   START Entresto  24/26 mg( 1 tablet) Twice daily  INCREASE bumex  to 4 mg in the am and 2 mg in the evening  Your provider has order Furoscix  for you. This is an on-body infuser that gives you a dose of Furosemide .   It will be shipped to your home from [facility], they will call you before shipping  Ensure you write down the time you start your infusion so that if there is a problem you will know how long the infusion lasted  Use Furoscix  only AS DIRECTED by our office Furoscix  is for 3 days only  Dosing Directions:   Day 1= today take furoscix     Day 2=  Friday 04/08/2024 take furoscix  with 80 meq of potassium            Day 3= Saturday 04/09/2024 take furoscix  with 80 meq of potassium  Then back to oral dose  Your physician recommends that you schedule a follow-up appointment in: as scheduled  If you have any questions or concerns before your next appointment please send us  a message through [place] or call our office at [phone].    TO LEAVE A MESSAGE FOR THE NURSE SELECT OPTION 2, PLEASE LEAVE A MESSAGE INCLUDING: YOUR NAME DATE OF BIRTH CALL BACK NUMBER REASON FOR CALL**this is important as we prioritize the call backs  YOU WILL RECEIVE A CALL BACK THE SAME DAY AS LONG AS YOU CALL BEFORE 4:00 PM At the [facility], you and your health needs are our priority. As part of our continuing mission to provide you with exceptional heart care, we have created designated Provider Care Teams. These Care Teams include your primary Cardiologist (physician) and Advanced Practice Providers (APPs- Physician Assistants and Nurse Practitioners) who all work together to provide you with the care you need, when you need it.   You may see any of the following providers on your  designated Care Team at your next follow up: Dr Toribio Fuel Dr Ezra Shuck Dr. Morene Brownie Greig Mosses, NP Caffie Shed, [place] [facility] [place], [place] Beckey Coe, NP Jordan Lee, NP Ellouise Class, NP Tinnie Redman, PharmD Jaun Bash, PharmD   Please be sure to bring in all your medications bottles to every appointment.    Thank you for choosing [facility]

## 2024-04-07 NOTE — Telephone Encounter (Signed)
 Advanced Heart Failure Patient Advocate Encounter  Test billing for this patient's current coverage [employer]) returns a $4 copay for 90 day supply of Entresto  (DAW 1 required).  This test claim was processed through [facility]- copay amounts may vary at other pharmacies due to pharmacy/plan contracts, or as the patient moves through the different stages of their insurance plan.  Rachel DEL, CPhT Rx Patient Advocate Phone: [phone]

## 2024-04-07 NOTE — Progress Notes (Signed)
 Specialty Pharmacy Refill Coordination Note  Alyssa Blanchard is a 54 y.o. female contacted today regarding refills of specialty medication(s) Furosemide  (Furoscix , LASIX )   Patient requested [name] at [facility] at [place] date: 04/08/24   Medication will be filled on: 04/07/24

## 2024-04-08 ENCOUNTER — Ambulatory Visit (HOSPITAL_COMMUNITY)
Admission: RE | Admit: 2024-04-08 | Discharge: 2024-04-08 | Disposition: A | Source: Ambulatory Visit | Attending: Internal Medicine

## 2024-04-13 ENCOUNTER — Telehealth (HOSPITAL_COMMUNITY): Payer: Self-pay

## 2024-04-13 NOTE — Telephone Encounter (Signed)
 Called to confirm/remind patient of their appointment at the [facility] Failure Clinic on 04/14/24 9:00.   Appointment:   [] Confirmed  [x] Left mess   [] No answer/No voice mail  [] VM Full/unable to leave message  [] Phone not in service  Patient reminded to bring all medications and/or complete list.  Confirmed patient has transportation. Gave directions, instructed to utilize valet parking.

## 2024-04-13 NOTE — Progress Notes (Incomplete)
 "  ADVANCED HEART FAILURE CLINIC NOTE  Primary Care: [name], MD Primary Cardiologist: Dr. Zenaida  HPI: Alyssa Blanchard is a 54 y.o. female with a PMH of HFpEF, untreated OSA/OHS, asthma, PAF, morbid obesity w/ prior laparoscopic sleeve gastrectomy in 2016, DM II, and HTN.  Admitted to [facility] in 1/25 with acute on chronic HFpEF and RLE cellulitis.   Admitted to [facility] in 2/25 with volume overload, Diuresed with IV lasix . Afib with RVR s/p TEE/DCCV to SR. Echo with EF 70-75%, RV okay, RVSP 39 mmHg. She was also treated for norovirus, RLE cellulitis, E. Coli UTI then candida vaginosis. Discharged to CIR, weight 392 lbs.   She saw [facility] AHF/transplant team for consult 3/25.   Admitted multiple times in 2025 for volume overload.  Follow up 07/27/23, had gained 24 lbs in 2 months. She was prescribed furoscix  and metolazone  x 3 days.   Seen in ED 08/18/23 for AF. Urgent follow up 08/19/23, Bumex  increased to 3 mg bid, continued metolazone  3x/week.  Follow up with EP 6/25, given flecainide  for PRN use for AF episodes. Not an ablation candidate at current weight.  Admitted 10/25 with AF with RVR. Seen by EP and started Tikosyn , converted to NSR. Dose reduced to 250 mg bid due to prolonged QTc. Metolazone  changed to PRN. Discharged home, weight 373 lbs.   Recently struggled with recurrent volume overload. Has required fuorscix on multiple occasions. Last seen 01/28/24, had not been able to complete fuorscix at home d/t GI illness. Weight up 30 lb from discharge in October. Prescribed 3 days of furoscix .  She cancelled the last few appts made by our clinic. Was scheduled for acute visit last week, however cancelled this as well. Her mother has been in and out of the hospital. Was seen in ED 03/25/24 for a/c CHF. She was given IV lasix  and discharged home.   Seen last week with 30 lb weight gain and shortness of breath. Did have recent fall. Noncompliance with CPAP. Furoscix  was prescribed  for 3 days then return to Bumex .   She returns today for HF follow up. Overall feeling ***. NYHA ***. Reports {Symptoms; cardiac:12860::dyspnea,fatigue}. Denies {Symptoms; cardiac:12860::chest pain,dyspnea,fatigue,near-syncope,orthopnea,palpitations,dizziness,abnormal bleeding}. Able to perform ADLs. Appetite okay. Weight at home ***. BP at home***. Compliant with all medications.   PMH, current medications, allergies, social history, and family history reviewed in epic.  Wt Readings from Last 3 Encounters:  04/07/24 (!) 190.4 kg (419 lb 12.8 oz)  02/17/24 (!) 181.4 kg (400 lb)  01/28/24 (!) 182.8 kg (403 lb)   LMP 12/20/2017   PHYSICAL EXAM:  General: Ill appearing. No distress  Cardiac: JVP to ear. No murmurs  Resp: Lung sounds clear and equal B/L Extremities: Warm and dry.  Generalized edema in thighs and abdomin.  Neuro: A&O x3. Affect pleasant.   General: *** appearing. No distress  Cardiac: JVP ~*** cm. No murmurs  Resp: Lung sounds clear and equal B/L Abdomen: Soft, non-distended.  Extremities: Warm and dry.  *** edema.  Neuro: A&O x3. Affect pleasant.   DATA REVIEW  ECG (personally reviewed): NSR 81 bpm  ECHO: 2/25: LVEF 70-75%, normal E prime, normal RV systolic function, mildly elevated RVSP, no gross valvular abnormalities  CATH: R/LHC at [facility] 9/23: RA mean 24, PA mean 49, PCWP mean 34, PVR 1.3 WU, Fick CI 2.01, TD CI 4.07, LVEDP 32, no CAD    ASSESSMENT & PLAN:  1. Acute on Chronic diastolic heart failure: Multiple readmissions, volume status obviously difficult  given body habitus. Suspect largely due to morbid obesity and untreated sleep apnea.  - NYHA IV, significantly volume overloaded. > 20lb up. Reports that her bumex  has not been working.  - Attempted IV Lasix  in clinic today, however access was attempted x2 and unable to be obtained - Start Furoscix  80 mg SQ + KCL 80 mEq x3 days; reorder Furoscix . Hold bumex  while using Furoscix . -  On 2/1: restart Bumex  at 4 mg qAM / 2 mg qPM + KCL 80 mEq daily - Avoid metolazone  with Tikosyn  - Continue spironolactone  50 mg daily. - Decrease Toprol  XL to 25 mg daily with volume overload and HFpEF, stop at follow up - Stop losartan ; start entresto  24/26 mg bid - BMET, Mg today; repeat at follow up  2. Morbid obesity: Previous gastric sleeve. - There is no height or weight on file to calculate BMI. - Did not tolerate GLP1  3. PAF: Previously on tikosyn , s/p DCCV 2/25.  - 2 day holter monitor (3/25, [facility]): mostly NSR, frequent short runs of SVT, rare PVCs and PACs - Now established with [facility] EP, and plugged into AF clinic.  - on Tikosyn  per EP  - continue eliquis  5 mg bid. Denies bleeding  4. HTN: controlled on current regimen - elevated today, she has been out of her losartan  for a week - start entresto  as above  5. OSA:  - Sleep study at [facility] in 05/25 with moderate OSA - Has CPAP, not tolerating  Follow up in 1 week with APP  Follow up in *** with ***  Oseas Detty, NP 04/13/24  "

## 2024-04-14 ENCOUNTER — Ambulatory Visit (HOSPITAL_COMMUNITY): Admission: RE | Admit: 2024-04-14 | Source: Ambulatory Visit

## 2024-04-14 ENCOUNTER — Telehealth (HOSPITAL_COMMUNITY): Payer: Self-pay

## 2024-04-14 NOTE — Telephone Encounter (Signed)
 Called to confirm/remind patient of their appointment at the [facility] Failure Clinic on 04/15/24.   Appointment:   [x] Confirmed  [] Left mess   [] No answer/No voice mail  [] VM Full/unable to leave message  [] Phone not in service  Patient reminded to bring all medications and/or complete list.  Confirmed patient has transportation. Gave directions, instructed to utilize valet parking.

## 2024-04-15 ENCOUNTER — Ambulatory Visit (HOSPITAL_COMMUNITY): Payer: Self-pay | Admitting: Family Medicine

## 2024-04-15 ENCOUNTER — Ambulatory Visit (HOSPITAL_COMMUNITY): Admission: RE | Admit: 2024-04-15

## 2024-04-15 ENCOUNTER — Encounter (HOSPITAL_COMMUNITY): Payer: Self-pay

## 2024-04-15 ENCOUNTER — Other Ambulatory Visit: Payer: Self-pay

## 2024-04-15 ENCOUNTER — Other Ambulatory Visit: Payer: Self-pay | Admitting: Pharmacy Technician

## 2024-04-15 VITALS — BP 148/72 | HR 86 | Wt >= 6400 oz

## 2024-04-15 DIAGNOSIS — I480 Paroxysmal atrial fibrillation: Secondary | ICD-10-CM

## 2024-04-15 DIAGNOSIS — I10 Essential (primary) hypertension: Secondary | ICD-10-CM

## 2024-04-15 DIAGNOSIS — G4733 Obstructive sleep apnea (adult) (pediatric): Secondary | ICD-10-CM

## 2024-04-15 DIAGNOSIS — I5032 Chronic diastolic (congestive) heart failure: Secondary | ICD-10-CM

## 2024-04-15 LAB — BASIC METABOLIC PANEL WITH GFR
Anion gap: 11 (ref 5–15)
BUN: 12 mg/dL (ref 6–20)
CO2: 24 mmol/L (ref 22–32)
Calcium: 9.1 mg/dL (ref 8.9–10.3)
Chloride: 103 mmol/L (ref 98–111)
Creatinine, Ser: 0.72 mg/dL (ref 0.44–1.00)
GFR, Estimated: >60 mL/min
Glucose, Bld: 128 mg/dL — ABNORMAL HIGH (ref 70–99)
Potassium: 4.1 mmol/L (ref 3.5–5.1)
Sodium: 138 mmol/L (ref 135–145)

## 2024-04-15 LAB — MAGNESIUM: Magnesium: 2 mg/dL (ref 1.7–2.4)

## 2024-04-15 LAB — PRO BRAIN NATRIURETIC PEPTIDE: Pro Brain Natriuretic Peptide: 257 pg/mL

## 2024-04-15 MED ORDER — POTASSIUM CHLORIDE CRYS ER 20 MEQ PO TBCR: 60.0000 meq | EXTENDED_RELEASE_TABLET | Freq: Two times a day (BID) | ORAL | 3 refills | Status: AC

## 2024-04-15 MED ORDER — BUMETANIDE 1 MG PO TABS: ORAL_TABLET | ORAL | 7 refills | Status: AC

## 2024-04-15 MED ORDER — SACUBITRIL-VALSARTAN 49-51 MG PO TABS: 1.0000 | ORAL_TABLET | Freq: Two times a day (BID) | ORAL | 6 refills | Status: AC

## 2024-04-15 MED ORDER — FUROSCIX 80 MG/10ML ~~LOC~~ CTKT
80.0000 mg | CARTRIDGE | Freq: Every day | SUBCUTANEOUS | 1 refills | Status: AC
  Filled 2024-04-15: qty 3, 3d supply, fill #0

## 2024-04-15 NOTE — Progress Notes (Signed)
 "  ADVANCED HEART FAILURE CLINIC NOTE  Primary Care: [name], MD Primary Cardiologist: Dr. Zenaida  HPI: Alyssa Blanchard is a 54 y.o. female with a PMH of HFpEF, untreated OSA/OHS, asthma, PAF, morbid obesity w/ prior laparoscopic sleeve gastrectomy in 2016, DM II, and HTN.  Admitted to [facility] in 1/25 with acute on chronic HFpEF and RLE cellulitis.   Admitted to [facility] in 2/25 with volume overload, Diuresed with IV lasix . Afib with RVR s/p TEE/DCCV to SR. Echo with EF 70-75%, RV okay, RVSP 39 mmHg. She was also treated for norovirus, RLE cellulitis, E. Coli UTI then candida vaginosis. Discharged to CIR, weight 392 lbs.   She saw [facility] AHF/transplant team for consult 3/25.   Admitted multiple times in 2025 for volume overload.  Follow up 07/27/23, had gained 24 lbs in 2 months. She was prescribed furoscix  and metolazone  x 3 days.   Seen in ED 08/18/23 for AF. Urgent follow up 08/19/23, Bumex  increased to 3 mg bid, continued metolazone  3x/week.  Follow up with EP 6/25, given flecainide  for PRN use for AF episodes. Not an ablation candidate at current weight.  Admitted 10/25 with AF with RVR. Seen by EP and started Tikosyn , converted to NSR. Dose reduced to 250 mg bid due to prolonged QTc. Metolazone  changed to PRN. Discharged home, weight 373 lbs.   Recently struggled with recurrent volume overload. Has required fuorscix on multiple occasions. Last seen 01/28/24, had not been able to complete fuorscix at home d/t GI illness. Weight up 30 lb from discharge in October. Prescribed 3 days of furoscix .  Was seen in ED 03/25/24 for a/c CHF. She was given IV lasix  and discharged home.   Follow up 04/07/24, had 30 lbs weight gain and shortness of breath. Did have recent fall. Noncompliance with CPAP. Furoscix  was prescribed for 3 days, then return to Bumex .   Today she returns for HF follow up. Overall feeling better!. Feels awake since starting on Entresto , breathing improved after 3  doses of Furoscix . Urinated briskly after Furoscix  doses, urination is slowing down on current Bumex  dose. Remains SOB walking around the house, but can tell breathing is easier. Abdominal swelling has improved. She is chronically dizzy, she attributes this to Lyrica  and positional changes. RLE still swelling. Denies palpitations, abnormal bleeding, CP, or PND/Orthopnea. She chronically sleeps reclined. Appetite ok. Weight at home 402 pounds. Taking all medications. PMH, current medications, allergies, social history, and family history reviewed in epic.  Wt Readings from Last 3 Encounters:  04/15/24 (!) 183.8 kg (405 lb 3.2 oz)  04/07/24 (!) 190.4 kg (419 lb 12.8 oz)  02/17/24 (!) 181.4 kg (400 lb)   BP (!) 148/72   Pulse 86   Wt (!) 183.8 kg (405 lb 3.2 oz)   LMP 12/20/2017   SpO2 96%   BMI 67.43 kg/m   PHYSICAL EXAM: General:  NAD. No resp difficulty, walked into clinic HEENT: Normal Neck: Supple. No JVD. Thick neck Cor: Regular rate & rhythm. No rubs, gallops or murmurs. Lungs: Clear Abdomen: Obese, nontender, nondistended.  Extremities: No cyanosis, clubbing, rash, edema Neuro: Alert & oriented x 3, moves all 4 extremities w/o difficulty. Affect pleasant.  DATA REVIEW  ECG  04/15/24: NSR 86 bpm (personally reviewed)    ECHO: 2/25: LVEF 70-75%, normal E prime, normal RV systolic function, mildly elevated RVSP, no gross valvular abnormalities  CATH: R/LHC at [facility] 9/23: RA mean 24, PA mean 49, PCWP mean 34, PVR 1.3 WU, Fick CI 2.01,  TD CI 4.07, LVEDP 32, no CAD    ASSESSMENT & PLAN:  1. Chronic diastolic heart failure: Multiple readmissions, volume status obviously difficult given body habitus. Suspect largely due to morbid obesity and untreated sleep apnea.  - NYHA IV, volume overloaded but improving; weight down 14  lbs since last visit - Use Furoscix  + 80 KCL daily x 3 days, hold Bumex  while using Furoscix  - After 3 days, resume higher dose of Bumex  at 4 mg bid, increase  KCL to 60 bid. - Increase Entresto  to 49/51 mg bid - Continue spironolactone  50 mg daily. - Avoid metolazone  with Tikosyn  - Labs today.  2. Morbid obesity: Previous gastric sleeve. - Body mass index is 67.43 kg/m. - Did not tolerate GLP1  3. PAF: Previously on tikosyn , s/p DCCV 2/25.  - 2 day holter monitor (3/25, [facility]): mostly NSR, frequent short runs of SVT, rare PVCs and PACs - Now established with [facility] EP, and plugged into AF clinic.  - Regular on exam today. - on Tikosyn  per EP , QTc 461 msec on ECG today. - Continue Toprol  - Continue Eliquis  5 mg bid. Denies bleeding  4. HTN: BP elevated today - Increase Entresto  to 49/51 mg bid today - Meds as above  5. OSA:  - Sleep study at [facility] in 05/25 with moderate OSA - Has CPAP, not tolerating  Follow up in 2 weeks with APP.  [name], FNP 04/15/24  "

## 2024-04-15 NOTE — Progress Notes (Signed)
 Specialty Pharmacy Refill Coordination Note  Alyssa Blanchard is a 54 y.o. female contacted today regarding refills of specialty medication(s) Furosemide  (Furoscix )   Patient requested [name] at [facility] at [place] date: 04/18/24   Medication will be filled on: 04/15/24

## 2024-04-15 NOTE — Patient Instructions (Addendum)
 Good to see you today!  INCREASE entresto   to 49/51 mg Twice daily  INCREASE bumex  to 4 mg Twice daily  INCREASE potassium to 60 mg Twice daily  Your provider has order Furoscix  for you. This is an on-body infuser that gives you a dose of Furosemide .   It will be shipped to your home from [facility], they will call you before shipping  Ensure you write down the time you start your infusion so that if there is a problem you will know how long the infusion lasted  Use Furoscix  only AS DIRECTED by our office  Dosing Directions:   Day 1= Saturday 1 kit with 80 meq of potassium  Day 2= Sunday 1 kit with 80 meq of potassium  Day 3= Monday 1 kit with 80 meq of potassium  Labs done today, your results will be available in MyChart, we will contact you for abnormal readings.  Your physician recommends that you schedule a follow-up appointment  If you have any questions or concerns before your next appointment please send us  a message through [place] or call our office at [phone].    TO LEAVE A MESSAGE FOR THE NURSE SELECT OPTION 2, PLEASE LEAVE A MESSAGE INCLUDING: YOUR NAME DATE OF BIRTH CALL BACK NUMBER REASON FOR CALL**this is important as we prioritize the call backs  YOU WILL RECEIVE A CALL BACK THE SAME DAY AS LONG AS YOU CALL BEFORE 4:00 PM At the [facility], you and your health needs are our priority. As part of our continuing mission to provide you with exceptional heart care, we have created designated Provider Care Teams. These Care Teams include your primary Cardiologist (physician) and Advanced Practice Providers (APPs- Physician Assistants and Nurse Practitioners) who all work together to provide you with the care you need, when you need it.   You may see any of the following providers on your designated Care Team at your next follow up: Dr Toribio Fuel Dr Ezra Shuck Dr. Morene Brownie Greig Mosses, NP Caffie Shed,  [place] [facility] [place], [place] Beckey Coe, NP Jordan Lee, NP Ellouise Class, NP Tinnie Redman, PharmD Jaun Bash, PharmD   Please be sure to bring in all your medications bottles to every appointment.    Thank you for choosing [facility]

## 2024-04-26 ENCOUNTER — Ambulatory Visit (HOSPITAL_COMMUNITY): Admitting: Internal Medicine

## 2024-04-29 ENCOUNTER — Ambulatory Visit (HOSPITAL_COMMUNITY)

## 2024-05-20 ENCOUNTER — Ambulatory Visit (HOSPITAL_COMMUNITY): Admit: 2024-05-20 | Admitting: Oral Surgery

## 2024-05-20 SURGERY — DENTAL RESTORATION/EXTRACTIONS
Anesthesia: General

## 2024-06-08 ENCOUNTER — Ambulatory Visit (HOSPITAL_BASED_OUTPATIENT_CLINIC_OR_DEPARTMENT_OTHER): Admitting: Physical Therapy

## 2024-06-21 ENCOUNTER — Ambulatory Visit (HOSPITAL_BASED_OUTPATIENT_CLINIC_OR_DEPARTMENT_OTHER): Admitting: Family

## 2024-06-27 ENCOUNTER — Ambulatory Visit: Admitting: Family Medicine

## 2024-08-29 ENCOUNTER — Ambulatory Visit: Admitting: Podiatry

## 2024-08-30 ENCOUNTER — Ambulatory Visit: Admitting: Podiatry
